# Patient Record
Sex: Male | Born: 1975 | Race: White | Hispanic: No | Marital: Single | Smoking: Current every day smoker
Health system: Southern US, Community
[De-identification: ages and names within clinical notes are randomized; demographics above are authoritative.]

## PROBLEM LIST (undated history)

## (undated) DIAGNOSIS — R7303 Prediabetes: Secondary | ICD-10-CM

## (undated) DIAGNOSIS — G4733 Obstructive sleep apnea (adult) (pediatric): Secondary | ICD-10-CM

## (undated) DIAGNOSIS — E119 Type 2 diabetes mellitus without complications: Secondary | ICD-10-CM

## (undated) DIAGNOSIS — I2510 Atherosclerotic heart disease of native coronary artery without angina pectoris: Secondary | ICD-10-CM

## (undated) DIAGNOSIS — F909 Attention-deficit hyperactivity disorder, unspecified type: Secondary | ICD-10-CM

## (undated) DIAGNOSIS — K900 Celiac disease: Secondary | ICD-10-CM

## (undated) DIAGNOSIS — F329 Major depressive disorder, single episode, unspecified: Secondary | ICD-10-CM

## (undated) DIAGNOSIS — F32A Depression, unspecified: Secondary | ICD-10-CM

## (undated) DIAGNOSIS — I10 Essential (primary) hypertension: Secondary | ICD-10-CM

## (undated) DIAGNOSIS — Z91011 Allergy to milk products: Secondary | ICD-10-CM

## (undated) DIAGNOSIS — R7989 Other specified abnormal findings of blood chemistry: Secondary | ICD-10-CM

## (undated) DIAGNOSIS — F419 Anxiety disorder, unspecified: Secondary | ICD-10-CM

## (undated) DIAGNOSIS — I219 Acute myocardial infarction, unspecified: Secondary | ICD-10-CM

## (undated) DIAGNOSIS — E039 Hypothyroidism, unspecified: Secondary | ICD-10-CM

## (undated) DIAGNOSIS — F259 Schizoaffective disorder, unspecified: Secondary | ICD-10-CM

## (undated) DIAGNOSIS — R748 Abnormal levels of other serum enzymes: Secondary | ICD-10-CM

## (undated) DIAGNOSIS — F22 Delusional disorders: Secondary | ICD-10-CM

## (undated) DIAGNOSIS — E7800 Pure hypercholesterolemia, unspecified: Secondary | ICD-10-CM

## (undated) DIAGNOSIS — M1990 Unspecified osteoarthritis, unspecified site: Secondary | ICD-10-CM

## (undated) HISTORY — DX: Obstructive sleep apnea (adult) (pediatric): G47.33

## (undated) HISTORY — PX: WRIST FRACTURE SURGERY: SHX121

## (undated) HISTORY — PX: NASAL SEPTUM SURGERY: SHX37

## (undated) HISTORY — DX: Allergy to milk products: Z91.011

## (undated) HISTORY — DX: Essential (primary) hypertension: I10

## (undated) HISTORY — DX: Celiac disease: K90.0

## (undated) HISTORY — DX: Pure hypercholesterolemia, unspecified: E78.00

## (undated) HISTORY — PX: APPENDECTOMY: SHX54

## (undated) HISTORY — PX: CARDIAC CATHETERIZATION: SHX172

## (undated) HISTORY — PX: BRAIN SURGERY: SHX531

## (undated) HISTORY — PX: UPPER GI ENDOSCOPY: SHX6162

## (undated) HISTORY — DX: Prediabetes: R73.03

## (undated) HISTORY — PX: COLONOSCOPY: SHX174

---

## 1999-10-28 ENCOUNTER — Ambulatory Visit (HOSPITAL_BASED_OUTPATIENT_CLINIC_OR_DEPARTMENT_OTHER): Admission: RE | Admit: 1999-10-28 | Discharge: 1999-10-28 | Payer: Self-pay | Admitting: Emergency Medicine

## 2000-05-19 ENCOUNTER — Ambulatory Visit: Admission: RE | Admit: 2000-05-19 | Discharge: 2000-05-19 | Payer: Self-pay | Admitting: Otolaryngology

## 2001-01-21 ENCOUNTER — Emergency Department (HOSPITAL_COMMUNITY): Admission: EM | Admit: 2001-01-21 | Discharge: 2001-01-21 | Payer: Self-pay | Admitting: Emergency Medicine

## 2002-05-05 ENCOUNTER — Ambulatory Visit (HOSPITAL_COMMUNITY): Admission: RE | Admit: 2002-05-05 | Discharge: 2002-05-05 | Payer: Self-pay | Admitting: *Deleted

## 2002-05-05 ENCOUNTER — Encounter: Payer: Self-pay | Admitting: *Deleted

## 2003-03-18 ENCOUNTER — Ambulatory Visit (HOSPITAL_COMMUNITY): Admission: RE | Admit: 2003-03-18 | Discharge: 2003-03-18 | Payer: Self-pay | Admitting: Family Medicine

## 2003-07-02 ENCOUNTER — Ambulatory Visit (HOSPITAL_COMMUNITY): Admission: RE | Admit: 2003-07-02 | Discharge: 2003-07-02 | Payer: Self-pay | Admitting: Orthopedic Surgery

## 2003-07-02 IMAGING — CT CT EXTREM UP W/O CM*R*
3 of 4 series · 16 of 33 positions shown, 19 images · non-contrast
Comparison: none

CLINICAL DATA: Right wrist pain and immobility since a fall in [DATE].  The current examination is to evaluate the scaphoid.
 RIGHT WRIST CT WITHOUT CONTRAST ? [DATE]
 Helical transaxial images of the right wrist were obtained without intravenous contrast.  Multiplanar sagittal and coronal reconstruction images were also obtained as well as 3-D surface reconstruction images.  These are correlated with the right wrist radiographs obtained at [REDACTED] of [HOSPITAL] on [DATE].
 A partially healed scaphoid waist fracture is demonstrated with persistent fracture lines dorsally and ventrally.  No displacement.  Mild perifracture bone resorption.  There are also two persistent fracture lines extending into the distal scaphoid which are also partially healed.  
 IMPRESSION
 Partially healed comminuted fracture involving the scaphoid waist and distal scaphoid.  No displacement and no CT findings suspicious for avascular necrosis.
 MULTIPLANAR CT RECONSTRUCTION ? [DATE]
 Multiplanar sagittal, coronal, and 3-D surface reconstruction images demonstrate the findings described above.  
 Please see the above report.

[Series 4: rt.wrist 1.25 b80s · axial · 0.17mm/px · z∈[+494,+543]mm · 8 of 128 slices shown, 10 images]
[im 15/128  soft-tissue]
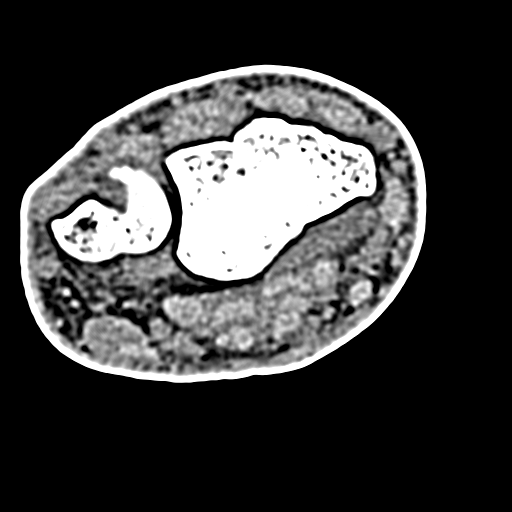
[im 15/128  bone]
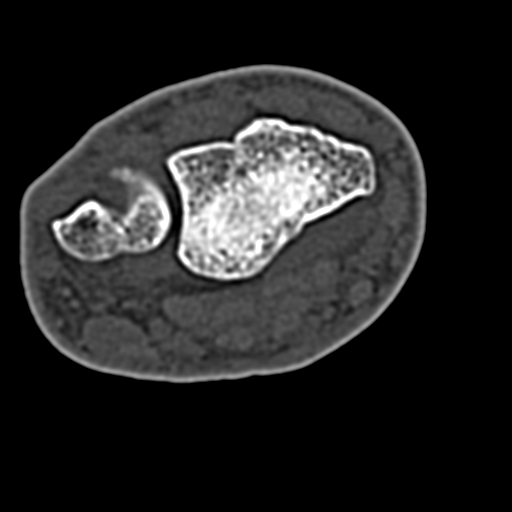
[im 29/128  bone]
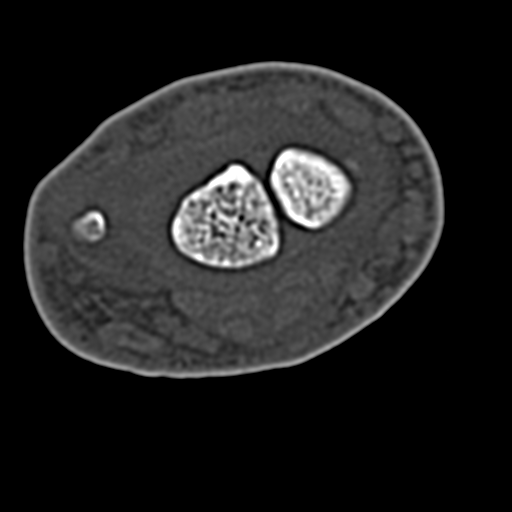
[im 43/128  bone]
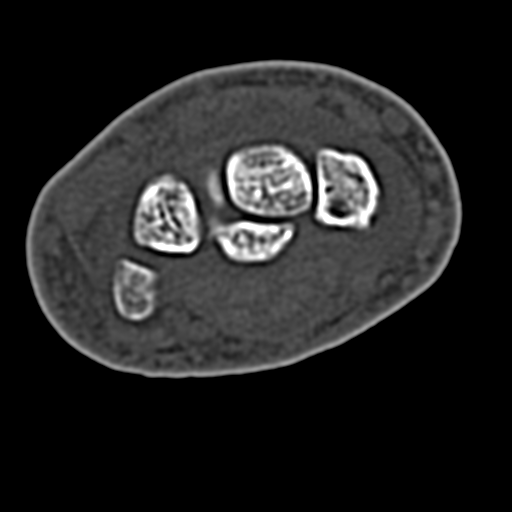
[im 57/128  bone]
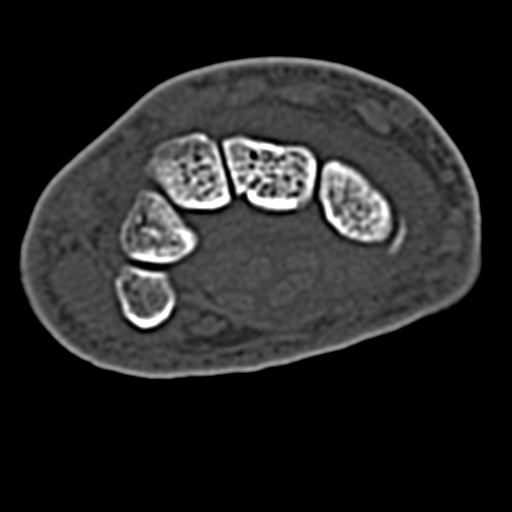
[im 71/128  soft-tissue]
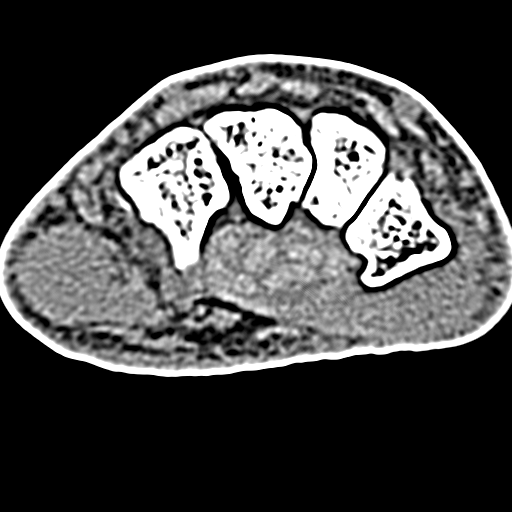
[im 71/128  bone]
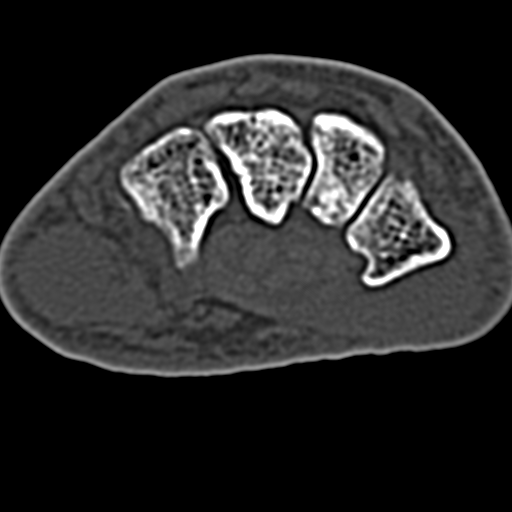
[im 85/128  bone]
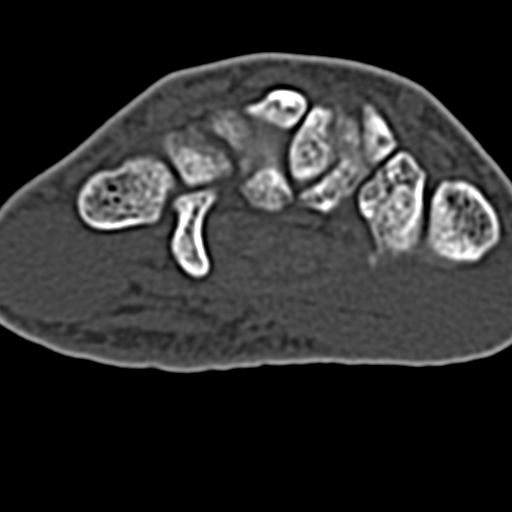
[im 99/128  bone]
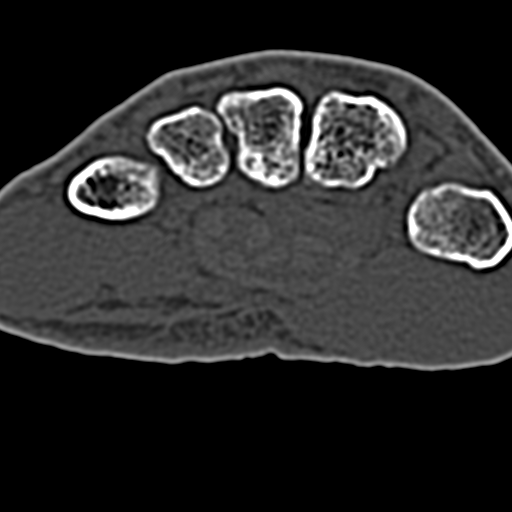
[im 113/128  bone]
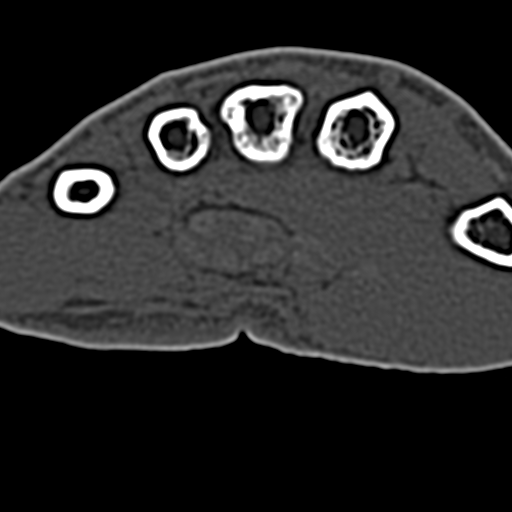

[Series 602: s mpr<mpr range> · sagittal · 0.17mm/px · 5 of 40 slices shown, 6 images]
[im 14/40  bone]
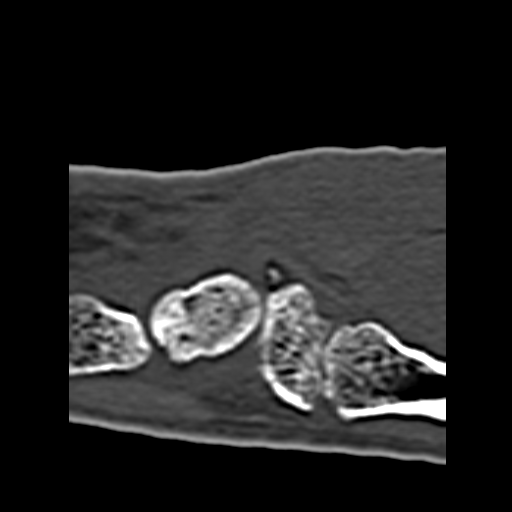
[im 17/40  bone]
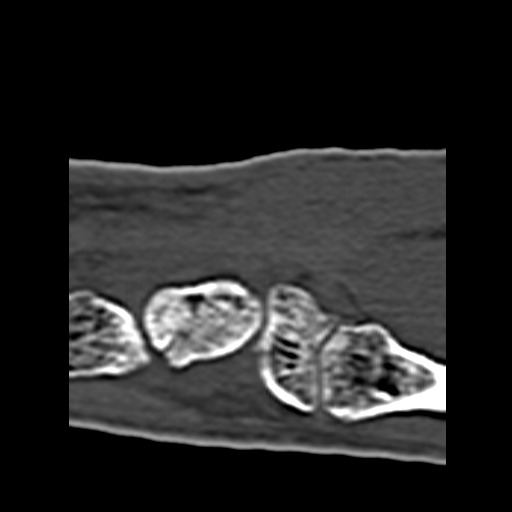
[im 20/40  soft-tissue]
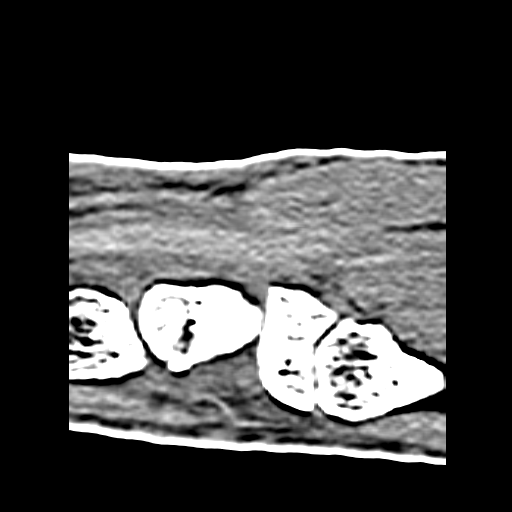
[im 20/40  bone]
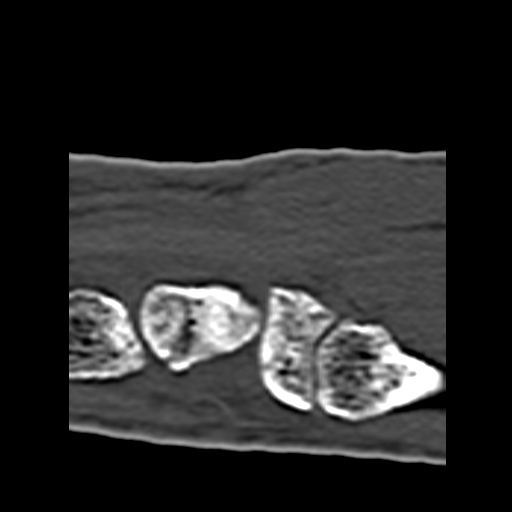
[im 23/40  bone]
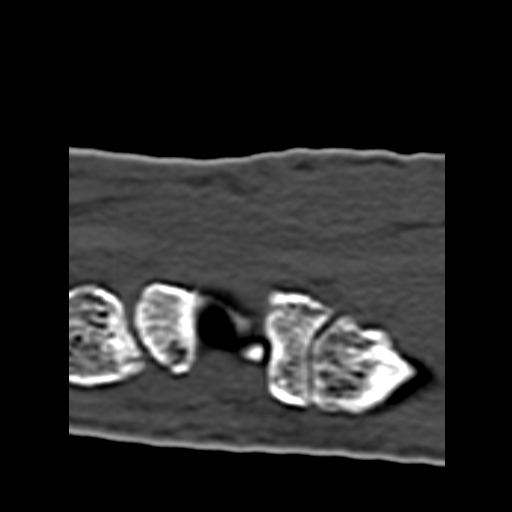
[im 27/40  bone]
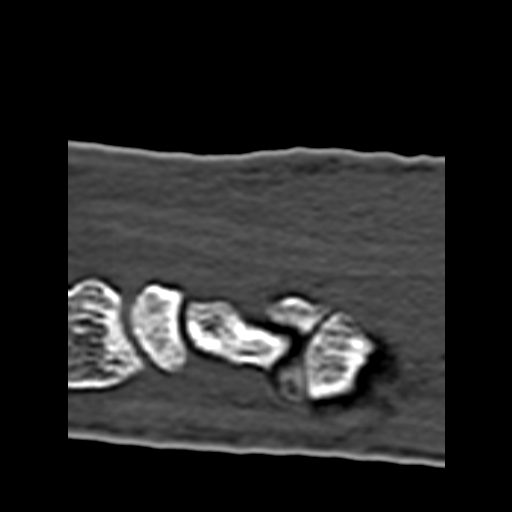

[Series 603: 2nd mpr<mpr range> · coronal · 0.17mm/px · 3 of 40 slices shown]
[im 8/40  bone]
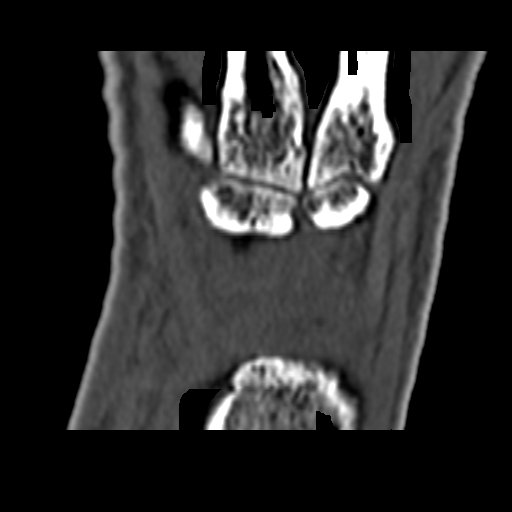
[im 16/40  bone]
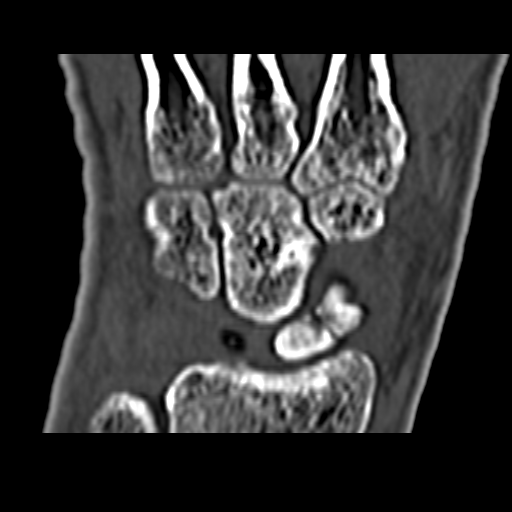
[im 24/40  bone]
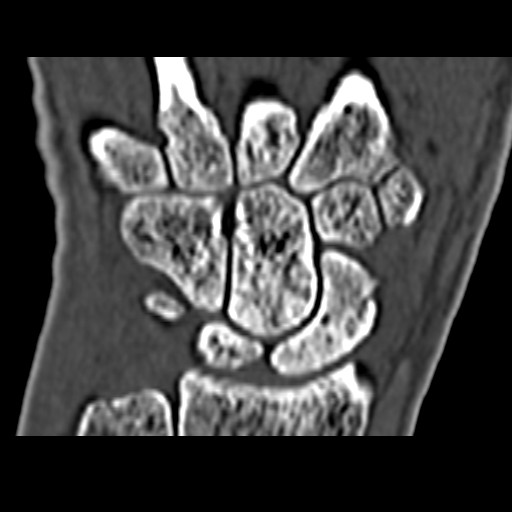

[16 of 33 positions shown; findings below may reference images not displayed]

## 2003-10-14 ENCOUNTER — Ambulatory Visit (HOSPITAL_BASED_OUTPATIENT_CLINIC_OR_DEPARTMENT_OTHER): Admission: RE | Admit: 2003-10-14 | Discharge: 2003-10-14 | Payer: Self-pay | Admitting: Orthopedic Surgery

## 2014-06-04 ENCOUNTER — Institutional Professional Consult (permissible substitution): Payer: Self-pay | Admitting: Pulmonary Disease

## 2014-06-09 ENCOUNTER — Institutional Professional Consult (permissible substitution): Payer: Self-pay | Admitting: Pulmonary Disease

## 2014-06-11 ENCOUNTER — Institutional Professional Consult (permissible substitution): Payer: Self-pay | Admitting: Internal Medicine

## 2014-07-14 ENCOUNTER — Ambulatory Visit (INDEPENDENT_AMBULATORY_CARE_PROVIDER_SITE_OTHER): Payer: 59 | Admitting: Pulmonary Disease

## 2014-07-14 ENCOUNTER — Encounter: Payer: Self-pay | Admitting: Pulmonary Disease

## 2014-07-14 VITALS — BP 132/98 | HR 79 | Ht 74.0 in | Wt 256.6 lb

## 2014-07-14 DIAGNOSIS — R911 Solitary pulmonary nodule: Secondary | ICD-10-CM

## 2014-07-14 DIAGNOSIS — G4733 Obstructive sleep apnea (adult) (pediatric): Secondary | ICD-10-CM

## 2014-07-14 NOTE — Progress Notes (Signed)
Subjective:    Patient ID: Matthew [name], male    DOB: [birth year], 39 y.o.   MRN: [id number]  HPI  Chief Complaint  Patient presents with  . Sleep Consult    Referred by Dr. 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 He reports loud snoring and excessive daytime somnolence since a teenager. He underwent a sleep study, and was advised surgery for deviated septum. Tonsillectomy was deferred. This did not resolve his symptoms. Polysomnogram in 10/2010 in [place] showed severe OSA with AHI of 51 per hour and lowest desaturation 79%-weight 225 pounds. This was corrected by CPAP of 10 cm. However nasal mask caused impressions on his face, and he stopped using this after a few months. He now reports increasing symptoms and would like to get reevaluated. Epworth sleepiness score is 12. He reports frequent nocturnal awakenings and non-refreshing sleep. Bedtime is around 12:30 PM-he works the second shift, sleep latency is 30 minutes, sleeps on his stomach with 3 pillows, reports 2-3 nocturnal awakenings without any post void sleep latency, is out of bed by 10 AM feeling tired with occasional dryness of mouth, denies headaches. He is gained 25 pounds in the last few years to his current weight of 256 pounds. He also has celiac disease that is diet-controlled, chronic anxiety on clonazepam and ADHD on Adderall. He reports finding of a nodule in the right upper lobe-the CT scanner was not formally reported, and is not available. He smokes about 3-5 cigarettes daily  Past Medical History  Diagnosis Date  . Celiac disease   . Hypertension   . Pre-diabetes   . Dairy allergy   . High cholesterol     Past Surgical History  Procedure Laterality Date  . Appendectomy    .  Nasal septum surgery    . Wrist fracture surgery      right wrist    No Known Allergies  History   Social History  . Marital Status: Single    Spouse Name: N/A  . Number of Children: 0  . Years of Education: N/A   Occupational History  . CT Tech    Social History Main Topics  . Smoking status: Current Every Day Smoker  . Smokeless tobacco: Not on file     Comment: 2 cigarettes daily  . Alcohol Use: 0.0 oz/week    0 Standard drinks or equivalent per week     Comment: rarely  . Drug Use: No  . Sexual Activity: Not on file   Other Topics Concern  . Not on file   Social History Narrative  . No narrative on file    Family History  Problem Relation Age of Onset  . Asthma Mother   . Hypertension Mother   . Hypertension Father      Review of Systems neg for any significant sore throat, dysphagia, itching, sneezing, nasal congestion or excess/ purulent secretions, fever, chills, sweats, unintended wt loss, pleuritic or exertional cp, hempoptysis, orthopnea pnd or change in chronic leg swelling. Also denies presyncope, palpitations, heartburn, abdominal pain, nausea, vomiting, diarrhea or change in bowel or urinary habits, dysuria,hematuria, rash, arthralgias, visual complaints, headache, numbness weakness or ataxia.     Objective:   Physical Exam  Gen. Pleasant, obese, in no distress, normal affect ENT -  no lesions, no post nasal drip, class 2-3 airway Neck: No JVD, no thyromegaly, no carotid bruits Lungs: no use of accessory muscles, no dullness to percussion, decreased without rales or rhonchi  Cardiovascular: Rhythm regular, heart sounds  normal, no murmurs or gallops, no peripheral edema Abdomen: soft and non-tender, no hepatosplenomegaly, BS normal. Musculoskeletal: No deformities, no cyanosis or clubbing Neuro:  alert, non focal, no tremors       Assessment & Plan:

## 2014-07-14 NOTE — Assessment & Plan Note (Signed)
Would like to review CT films if he is able to provide. Otherwise 1 year follow-up in October 2016. Smoking cessation was advised

## 2014-07-14 NOTE — Assessment & Plan Note (Signed)
You have severe obstructive sleep apnea  Home sleep study & CPAP titration - based on these, we will send order to DME to provide you with a new CPAP & mask   Given excessive daytime somnolence, narrow pharyngeal exam, witnessed apneas & loud snoring, obstructive sleep apnea is very likely & an overnight polysomnogram will be scheduled as a split study.

## 2014-07-14 NOTE — Patient Instructions (Signed)
You have severe obstructive sleep apnea  Home sleep study & CPAP titration - based on these, we will send order to DME to provide you with a new CPAP & mask Follow up CT chest on october 2016

## 2014-07-28 DIAGNOSIS — G4730 Sleep apnea, unspecified: Secondary | ICD-10-CM

## 2014-07-29 ENCOUNTER — Telehealth: Payer: Self-pay | Admitting: Pulmonary Disease

## 2014-07-29 DIAGNOSIS — G4730 Sleep apnea, unspecified: Secondary | ICD-10-CM

## 2014-07-29 DIAGNOSIS — G4733 Obstructive sleep apnea (adult) (pediatric): Secondary | ICD-10-CM

## 2014-07-29 NOTE — Telephone Encounter (Signed)
Patient stopped breathing 30/hour -- needs CPAP titration study.

## 2014-07-29 NOTE — Telephone Encounter (Signed)
lmtcb

## 2014-07-30 ENCOUNTER — Other Ambulatory Visit: Payer: Self-pay | Admitting: *Deleted

## 2014-07-30 DIAGNOSIS — G4733 Obstructive sleep apnea (adult) (pediatric): Secondary | ICD-10-CM

## 2014-08-02 NOTE — Telephone Encounter (Signed)
Pt is aware that he will need CPAP titration. Order has been placed.

## 2014-08-02 NOTE — Telephone Encounter (Signed)
Pt returned call [phone]

## 2014-09-15 ENCOUNTER — Encounter (HOSPITAL_BASED_OUTPATIENT_CLINIC_OR_DEPARTMENT_OTHER): Payer: 59

## 2014-09-16 ENCOUNTER — Other Ambulatory Visit: Payer: Self-pay | Admitting: Otolaryngology

## 2014-09-16 DIAGNOSIS — E049 Nontoxic goiter, unspecified: Secondary | ICD-10-CM

## 2014-09-17 ENCOUNTER — Ambulatory Visit (HOSPITAL_BASED_OUTPATIENT_CLINIC_OR_DEPARTMENT_OTHER): Payer: 59 | Attending: Pulmonary Disease | Admitting: *Deleted

## 2014-09-17 VITALS — Ht 74.0 in | Wt 230.0 lb

## 2014-09-17 DIAGNOSIS — R0683 Snoring: Secondary | ICD-10-CM | POA: Insufficient documentation

## 2014-09-17 DIAGNOSIS — G4733 Obstructive sleep apnea (adult) (pediatric): Secondary | ICD-10-CM | POA: Diagnosis not present

## 2014-09-23 DIAGNOSIS — G4730 Sleep apnea, unspecified: Secondary | ICD-10-CM | POA: Diagnosis not present

## 2014-09-24 ENCOUNTER — Telehealth: Payer: Self-pay | Admitting: Pulmonary Disease

## 2014-09-24 DIAGNOSIS — G4733 Obstructive sleep apnea (adult) (pediatric): Secondary | ICD-10-CM

## 2014-09-24 NOTE — Telephone Encounter (Signed)
CPAP 11 cm, large FF mask, order sent to DME Pl arrange OV in 6 wks

## 2014-09-24 NOTE — Telephone Encounter (Signed)
Patient notified. Patient scheduled for 6 week follow up.  Nothing further needed.

## 2014-09-25 ENCOUNTER — Emergency Department (HOSPITAL_COMMUNITY): Payer: 59

## 2014-09-25 ENCOUNTER — Emergency Department (HOSPITAL_COMMUNITY)
Admission: EM | Admit: 2014-09-25 | Discharge: 2014-09-26 | Disposition: A | Discharge status: Home / Self Care | Payer: 59 | Attending: Emergency Medicine | Admitting: Emergency Medicine

## 2014-09-25 ENCOUNTER — Encounter (HOSPITAL_COMMUNITY): Payer: Self-pay | Admitting: Emergency Medicine

## 2014-09-25 DIAGNOSIS — Z8719 Personal history of other diseases of the digestive system: Secondary | ICD-10-CM | POA: Insufficient documentation

## 2014-09-25 DIAGNOSIS — Z720 Tobacco use: Secondary | ICD-10-CM | POA: Insufficient documentation

## 2014-09-25 DIAGNOSIS — R110 Nausea: Secondary | ICD-10-CM | POA: Insufficient documentation

## 2014-09-25 DIAGNOSIS — I10 Essential (primary) hypertension: Secondary | ICD-10-CM | POA: Insufficient documentation

## 2014-09-25 DIAGNOSIS — R519 Headache, unspecified: Secondary | ICD-10-CM

## 2014-09-25 DIAGNOSIS — H53149 Visual discomfort, unspecified: Secondary | ICD-10-CM | POA: Diagnosis not present

## 2014-09-25 DIAGNOSIS — R51 Headache: Secondary | ICD-10-CM | POA: Insufficient documentation

## 2014-09-25 DIAGNOSIS — Z8639 Personal history of other endocrine, nutritional and metabolic disease: Secondary | ICD-10-CM | POA: Diagnosis not present

## 2014-09-25 DIAGNOSIS — Z79899 Other long term (current) drug therapy: Secondary | ICD-10-CM | POA: Insufficient documentation

## 2014-09-25 LAB — COMPREHENSIVE METABOLIC PANEL
ALBUMIN: 4.4 g/dL (ref 3.5–5.0)
ALT: 38 U/L (ref 17–63)
ANION GAP: 7 (ref 5–15)
AST: 26 U/L (ref 15–41)
Alkaline Phosphatase: 54 U/L (ref 38–126)
BUN: 14 mg/dL (ref 6–20)
CO2: 25 mmol/L (ref 22–32)
CREATININE: 1.33 mg/dL — AB (ref 0.61–1.24)
Calcium: 9.3 mg/dL (ref 8.9–10.3)
Chloride: 101 mmol/L (ref 101–111)
GFR calc Af Amer: >60 mL/min (ref >60)
GFR calc non Af Amer: >60 mL/min (ref >60)
Glucose, Bld: 118 mg/dL — ABNORMAL HIGH (ref 70–99)
Potassium: 3.5 mmol/L (ref 3.5–5.1)
Sodium: 133 mmol/L — ABNORMAL LOW (ref 135–145)
Total Bilirubin: 0.6 mg/dL (ref 0.3–1.2)
Total Protein: 7.1 g/dL (ref 6.5–8.1)

## 2014-09-25 LAB — CBC WITH DIFFERENTIAL/PLATELET
BASOS ABS: 0 10*3/uL (ref 0.0–0.1)
BASOS PCT: 0 % (ref 0–1)
EOS ABS: 0.1 10*3/uL (ref 0.0–0.7)
Eosinophils Relative: 1 % (ref 0–5)
HEMATOCRIT: 45.2 % (ref 39.0–52.0)
Hemoglobin: 16.1 g/dL (ref 13.0–17.0)
LYMPHS PCT: 25 % (ref 12–46)
Lymphs Abs: 3.9 10*3/uL (ref 0.7–4.0)
MCH: 30.9 pg (ref 26.0–34.0)
MCHC: 35.6 g/dL (ref 30.0–36.0)
MCV: 86.8 fL (ref 78.0–100.0)
MONO ABS: 0.8 10*3/uL (ref 0.1–1.0)
Monocytes Relative: 5 % (ref 3–12)
Neutro Abs: 11 10*3/uL — ABNORMAL HIGH (ref 1.7–7.7)
Neutrophils Relative %: 69 % (ref 43–77)
PLATELETS: 282 10*3/uL (ref 150–400)
RBC: 5.21 MIL/uL (ref 4.22–5.81)
RDW: 13.2 % (ref 11.5–15.5)
WBC: 15.8 10*3/uL — AB (ref 4.0–10.5)

## 2014-09-25 IMAGING — CT CT HEAD W/O CM
1 of 2 series · 16 of 30 positions shown, 20 images · non-contrast
Comparison: None.

CLINICAL DATA: Headache and hypertension.

EXAM:
CT HEAD WITHOUT CONTRAST
TECHNIQUE: Contiguous axial images were obtained from the base of the skull
through the vertex without intravenous contrast.

[Series 3: head 2.0 h70h · axial · 0.48mm/px · z∈[-40,+106]mm · 16 of 83 slices shown, 20 images]
[im 5/83  brain]
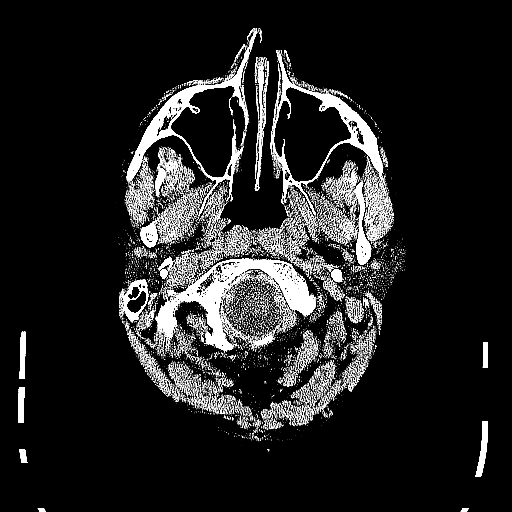
[im 5/83  bone]
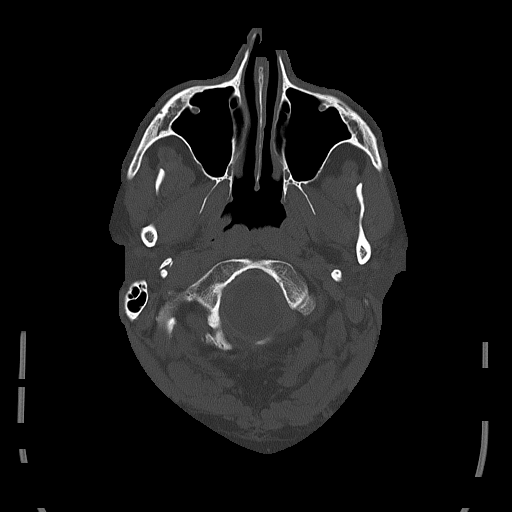
[im 9/83  brain]
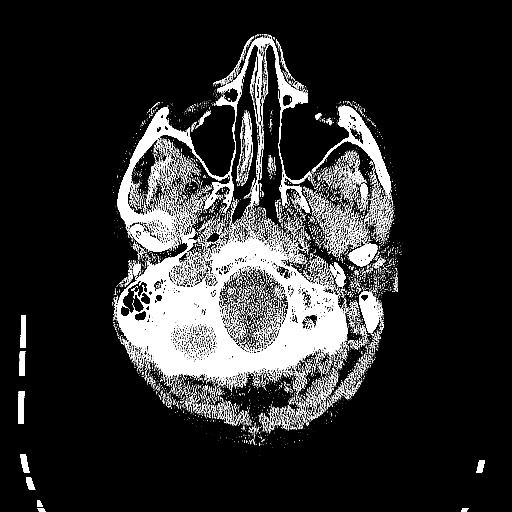
[im 13/83  brain]
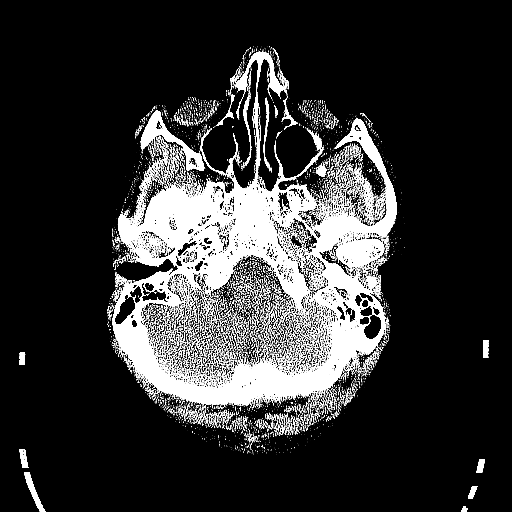
[im 21/83  brain]
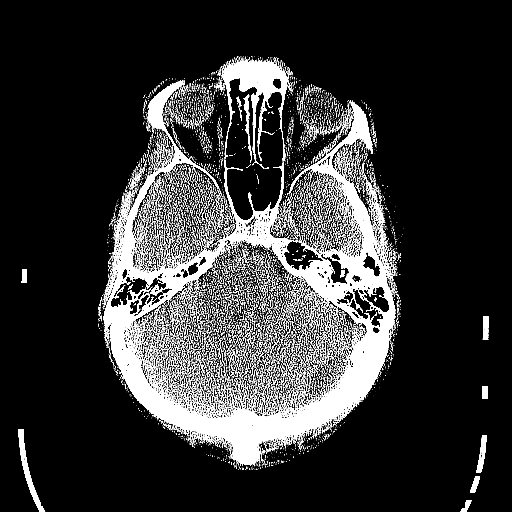
[im 25/83  brain]
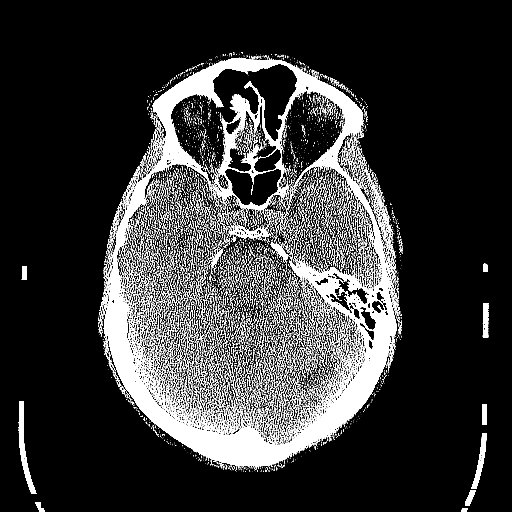
[im 25/83  bone]
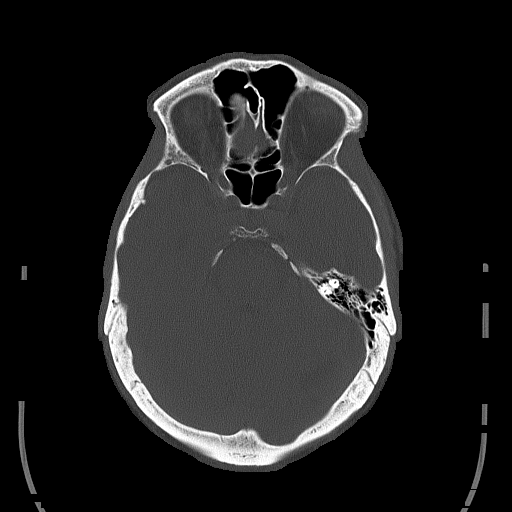
[im 29/83  brain]
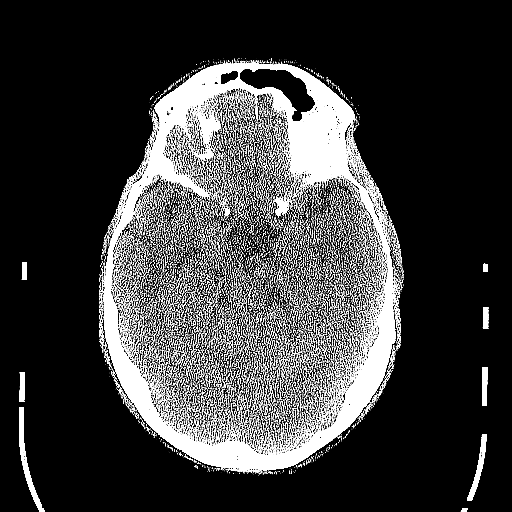
[im 33/83  brain]
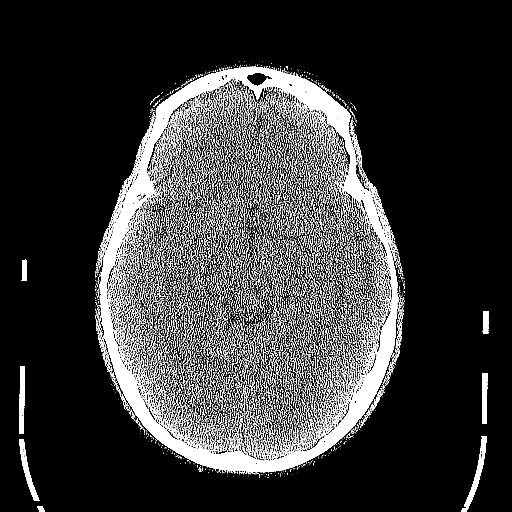
[im 37/83  brain]
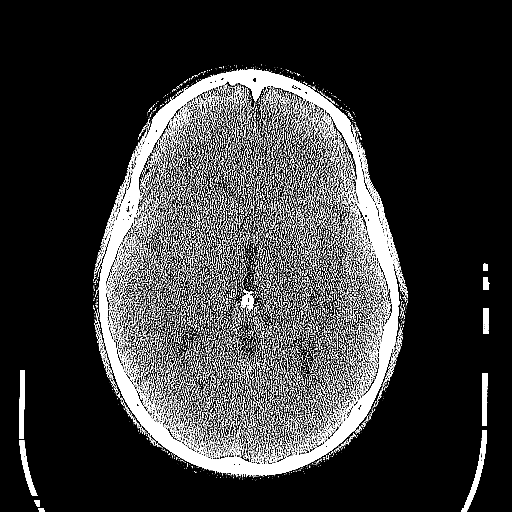
[im 46/83  brain]
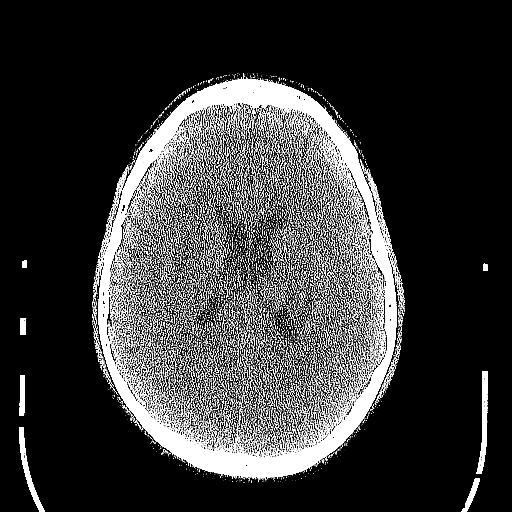
[im 46/83  bone]
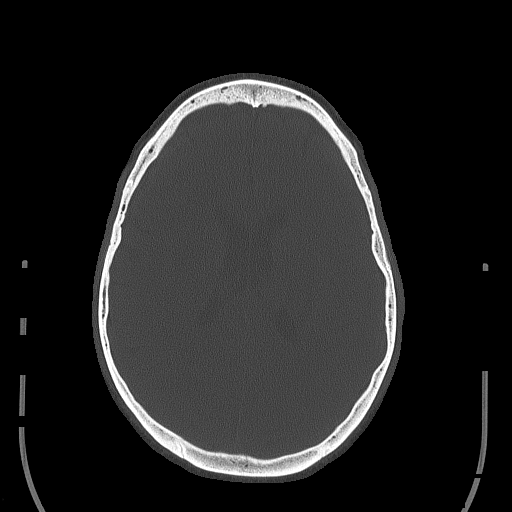
[im 50/83  brain]
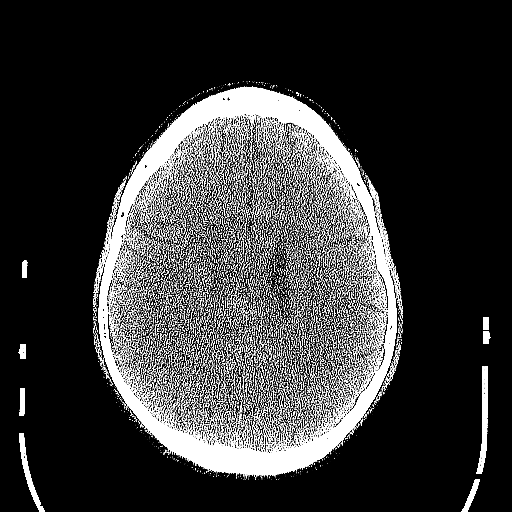
[im 54/83  brain]
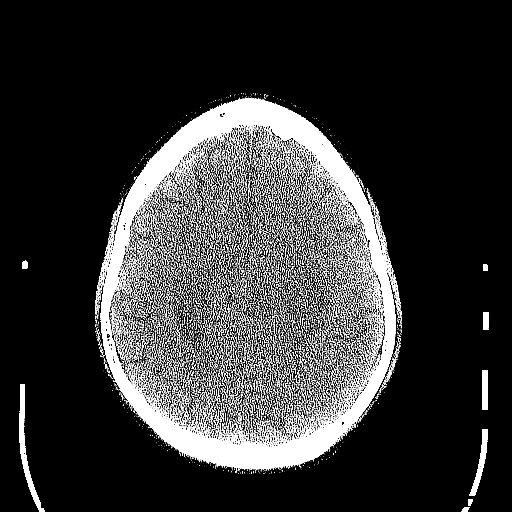
[im 58/83  brain]
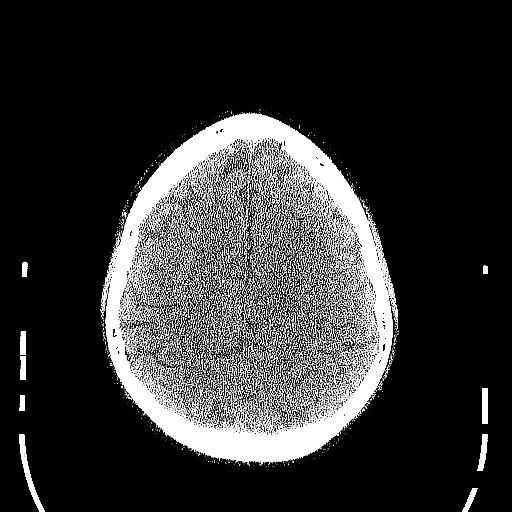
[im 62/83  brain]
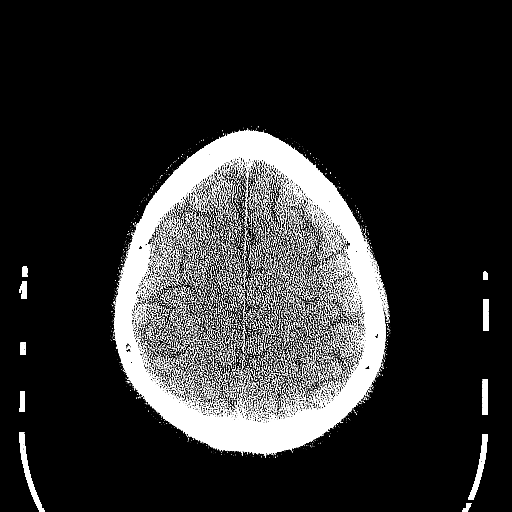
[im 62/83  bone]
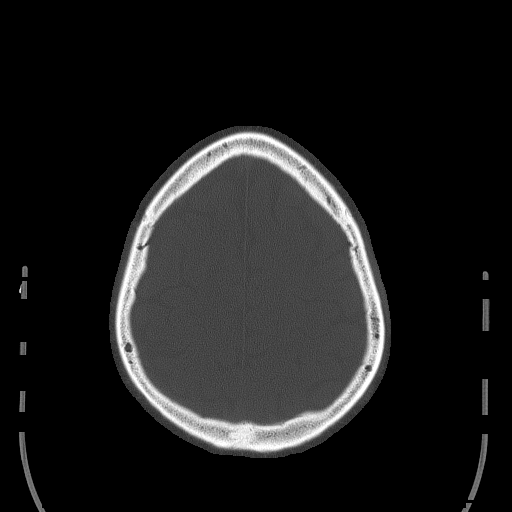
[im 70/83  brain]
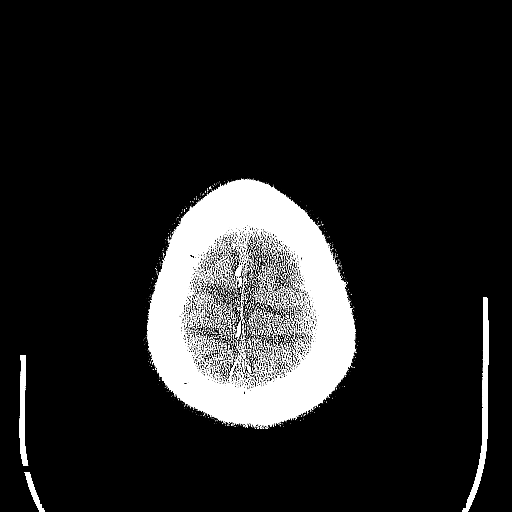
[im 74/83  brain]
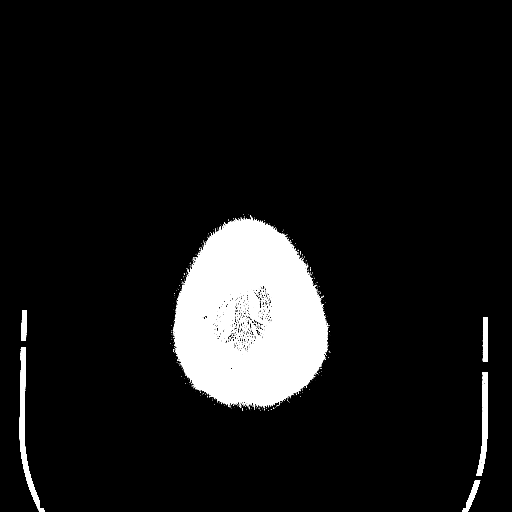
[im 78/83  brain]
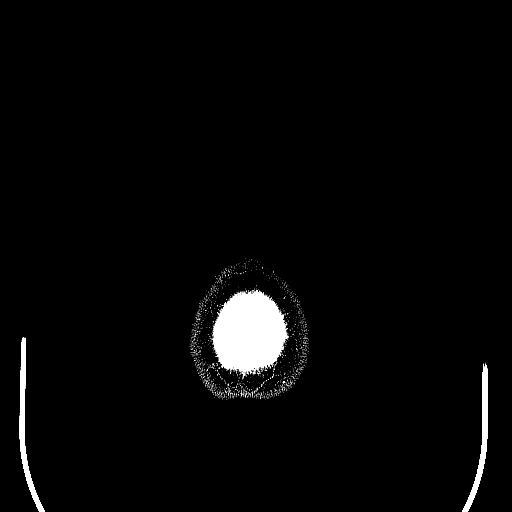

[16 of 30 positions shown; findings below may reference images not displayed]

FINDINGS: There is a 2.9 x 1.0 cm CSF density focus in the left posterior
superior aspect of the posterior fossa along the tentorium
suggestive of a small arachnoid cyst. The brain is otherwise
unremarkable without evidence of acute infarct, intracranial
hemorrhage, mass, midline shift, or extra-axial fluid collection.
Ventricles and sulci are normal for age.

Orbits are unremarkable. Mastoid air cells and visualized paranasal
sinuses are clear.
IMPRESSION: 1. No evidence of acute intracranial abnormality.
2. Likely incidental, small arachnoid cyst in the left posterior
fossa.

## 2014-09-25 MED ORDER — PROCHLORPERAZINE EDISYLATE 5 MG/ML IJ SOLN
10.0000 mg | Freq: Once | INTRAMUSCULAR | Status: AC
  Administered 2014-09-25: 10 mg via INTRAVENOUS
  Filled 2014-09-25: qty 2

## 2014-09-25 MED ORDER — DIPHENHYDRAMINE HCL 50 MG/ML IJ SOLN
25.0000 mg | Freq: Once | INTRAMUSCULAR | Status: AC
  Administered 2014-09-25: 25 mg via INTRAVENOUS
  Filled 2014-09-25: qty 1

## 2014-09-25 MED ORDER — TIZANIDINE HCL 4 MG PO CAPS: 4.0000 mg | ORAL_CAPSULE | Freq: Three times a day (TID) | ORAL | Status: DC | PRN

## 2014-09-25 MED ORDER — DEXAMETHASONE SODIUM PHOSPHATE 10 MG/ML IJ SOLN
10.0000 mg | Freq: Once | INTRAMUSCULAR | Status: AC
  Administered 2014-09-25: 10 mg via INTRAVENOUS
  Filled 2014-09-25: qty 1

## 2014-09-25 MED ORDER — METOCLOPRAMIDE HCL 5 MG/ML IJ SOLN
10.0000 mg | Freq: Once | INTRAMUSCULAR | Status: AC
  Administered 2014-09-25: 10 mg via INTRAVENOUS
  Filled 2014-09-25: qty 2

## 2014-09-25 NOTE — Discharge Instructions (Signed)
Please follow up with your primary care physician in 1-2 days. If you do not have one please call the [facility] number listed above. Please follow up with a neurologist to schedule a follow up appointment. On your CT scan you were found to have a 3 cm x 1 cm arachnoid cyst, likely an incidental finding. The neurologists should be able to follow this for you. Please read all discharge instructions and return precautions.   Migraine Headache A migraine headache is an intense, throbbing pain on one or both sides of your head. A migraine can last for 30 minutes to several hours. CAUSES  The exact cause of a migraine headache is not always known. However, a migraine may be caused when nerves in the brain become irritated and release chemicals that cause inflammation. This causes pain. Certain things may also trigger migraines, such as:  Alcohol.  Smoking.  Stress.  Menstruation.  Aged cheeses.  Foods or drinks that contain nitrates, glutamate, aspartame, or tyramine.  Lack of sleep.  Chocolate.  Caffeine.  Hunger.  Physical exertion.  Fatigue.  Medicines used to treat chest pain (nitroglycerine), birth control pills, estrogen, and some blood pressure medicines. SIGNS AND SYMPTOMS  Pain on one or both sides of your head.  Pulsating or throbbing pain.  Severe pain that prevents daily activities.  Pain that is aggravated by any physical activity.  Nausea, vomiting, or both.  Dizziness.  Pain with exposure to bright lights, loud noises, or activity.  General sensitivity to bright lights, loud noises, or smells. Before you get a migraine, you may get warning signs that a migraine is coming (aura). An aura may include:  Seeing flashing lights.  Seeing bright spots, halos, or zigzag lines.  Having tunnel vision or blurred vision.  Having feelings of numbness or tingling.  Having trouble talking.  Having muscle weakness. DIAGNOSIS  A migraine  headache is often diagnosed based on:  Symptoms.  Physical exam.  A CT scan or MRI of your head. These imaging tests cannot diagnose migraines, but they can help rule out other causes of headaches. TREATMENT Medicines may be given for pain and nausea. Medicines can also be given to help prevent recurrent migraines.  HOME CARE INSTRUCTIONS  Only take over-the-counter or prescription medicines for pain or discomfort as directed by your health care provider. The use of long-term narcotics is not recommended.  Lie down in a dark, quiet room when you have a migraine.  Keep a journal to find out what may trigger your migraine headaches. For example, write down:  What you eat and drink.  How much sleep you get.  Any change to your diet or medicines.  Limit alcohol consumption.  Quit smoking if you smoke.  Get 7-9 hours of sleep, or as recommended by your health care provider.  Limit stress.  Keep lights dim if bright lights bother you and make your migraines worse. SEEK IMMEDIATE MEDICAL CARE IF:   Your migraine becomes severe.  You have a fever.  You have a stiff neck.  You have vision loss.  You have muscular weakness or loss of muscle control.  You start losing your balance or have trouble walking.  You feel faint or pass out.  You have severe symptoms that are different from your first symptoms. MAKE SURE YOU:   Understand these instructions.  Will watch your condition.  Will get help right away if you are not doing well or get worse. Document Released: 05/07/2005  Document Revised: 09/21/2013 Document Reviewed: 01/12/2013 [facility] Patient Information 2015 [place], [place]. This information is not intended to replace advice given to you by your health care provider. Make sure you discuss any questions you have with your health care provider.

## 2014-09-25 NOTE — ED Notes (Addendum)
Pt noted having intermittant bigeminy

## 2014-09-25 NOTE — ED Notes (Signed)
Pt. reports left side headache with photophobia and elevated blood pressure onset this week . , denies nausea or fever .

## 2014-09-25 NOTE — ED Notes (Signed)
Pt has Butrans TD patch on R shoulder foer back pain 715mcg/hr

## 2014-09-25 NOTE — ED Notes (Signed)
Lateral calf in R leg. C/o calf and foot cramps for last month

## 2014-09-25 NOTE — ED Provider Notes (Signed)
CSN: [id number]     Arrival date & time 09/25/14  2028 History   None    Chief Complaint  Patient presents with  . Headache  . Hypertension     (Consider location/radiation/quality/duration/timing/severity/associated sxs/prior Treatment) HPI Comments: Patient is a 39 yo M PMHx significant for HTN, HLD, tobacco abuse presenting to the ED for evaluation of left sided throbbing pounding headache that has been on and off the last few weeks. Patient states his headache re-started at 4:15PM more severe than his previous headaches. He states he has tried Excedrin with little to no improvement. Denies any falls or injuries, fevers, rashes. Endorses associated photophobia and nausea. No modifying factors identified.    Past Medical History  Diagnosis Date  . Celiac disease   . Hypertension   . Pre-diabetes   . Dairy allergy   . High cholesterol    Past Surgical History  Procedure Laterality Date  . Appendectomy    . Nasal septum surgery    . Wrist fracture surgery      right wrist  . Brain surgery     Family History  Problem Relation Age of Onset  . Asthma Mother   . Hypertension Mother   . Hypertension Father    History  Substance Use Topics  . Smoking status: Current Every Day Smoker  . Smokeless tobacco: Not on file     Comment: 2 cigarettes daily  . Alcohol Use: 0.0 oz/week    0 Standard drinks or equivalent per week     Comment: rarely    Review of Systems  Eyes: Positive for photophobia.  Gastrointestinal: Positive for nausea.  Neurological: Positive for headaches. Negative for syncope.  All other systems reviewed and are negative.     Allergies  Review of patient's allergies indicates no known allergies.  Home Medications   Prior to Admission medications   Medication Sig Start Date End Date Taking? Authorizing Provider  amphetamine-dextroamphetamine (ADDERALL) 10 MG tablet Take 30 mg by mouth 2 (two) times daily as needed.    Yes Historical Provider, MD   aspirin-acetaminophen-caffeine (EXCEDRIN MIGRAINE) [phone] MG per tablet Take 2 tablets by mouth 2 (two) times daily as needed for migraine.   Yes Historical Provider, MD  atenolol (TENORMIN) 100 MG tablet Take 100 mg by mouth at bedtime.    Yes Historical Provider, MD  Buprenorphine 15 MCG/HR PTWK Place 1 patch onto the skin once a week.   Yes Historical Provider, MD  DULoxetine HCl (CYMBALTA PO) Take 120 mg by mouth daily.   Yes Historical Provider, MD  methocarbamol (ROBAXIN) 750 MG tablet Take 750 mg by mouth 2 (two) times daily as needed for muscle spasms.   Yes Historical Provider, MD  mirtazapine (REMERON) 45 MG tablet Take 45 mg by mouth at bedtime.   Yes Historical Provider, MD  Misc Natural Products (JOINT SUPPORT COMPLEX PO) Take 1 tablet by mouth daily.   Yes Historical Provider, MD  Multiple Vitamin (MULTIVITAMIN) tablet Take 1 tablet by mouth daily.   Yes Historical Provider, MD  Omega-3 Fatty Acids (FISH OIL PO) Take 1 tablet by mouth daily.   Yes Historical Provider, MD  OXAYDO 5 MG TABA Take 5 mg by mouth 2 (two) times daily as needed (pain).  09/24/14  Yes Historical Provider, MD  oxymetazoline (AFRIN) 0.05 % nasal spray Place 1 spray into both nostrils daily as needed for congestion.   Yes Historical Provider, MD  pseudoephedrine (SUDAFED) 30 MG tablet Take 30 mg by mouth  daily as needed for congestion.   Yes Historical Provider, MD  traZODone (DESYREL) 100 MG tablet Take 100 mg by mouth at bedtime.   Yes Historical Provider, MD  clonazePAM (KLONOPIN) 2 MG tablet Take 2 mg by mouth 2 (two) times daily as needed for anxiety.    Historical Provider, MD  tiZANidine (ZANAFLEX) 4 MG capsule Take 1 capsule (4 mg total) by mouth 3 (three) times daily as needed (headache). 09/25/14   Audriella Blakeley, PA-C   BP 136/68 mmHg  Pulse 68  Temp(Src) 98 F (36.7 C) (Oral)  Resp 16  Ht 6\' 2"  (1.88 m)  Wt 228 lb (103.42 kg)  BMI 29.26 kg/m2  SpO2 100% Physical Exam  Constitutional:  He is oriented to person, place, and time. He appears well-developed and well-nourished. No distress.  HENT:  Head: Normocephalic and atraumatic.  Right Ear: External ear normal.  Left Ear: External ear normal.  Nose: Nose normal.  Mouth/Throat: Oropharynx is clear and moist. No oropharyngeal exudate.  Eyes: Conjunctivae and EOM are normal. Pupils are equal, round, and reactive to light.  Neck: Normal range of motion. Neck supple.  Cardiovascular: Normal rate, regular rhythm, normal heart sounds and intact distal pulses.   Pulmonary/Chest: Effort normal and breath sounds normal. No respiratory distress.  Abdominal: Soft. There is no tenderness.  Neurological: He is alert and oriented to person, place, and time. He has normal strength. No cranial nerve deficit. Gait normal. GCS eye subscore is 4. GCS verbal subscore is 5. GCS motor subscore is 6.  Sensation grossly intact.  No pronator drift.  Bilateral heel-knee-shin intact.  Skin: Skin is warm and dry. He is not diaphoretic.  Nursing note and vitals reviewed.   ED Course  Procedures (including critical care time) Medications  diphenhydrAMINE (BENADRYL) injection 25 mg (25 mg Intravenous Given 09/25/14 2208)  metoCLOPramide (REGLAN) injection 10 mg (10 mg Intravenous Given 09/25/14 2209)  prochlorperazine (COMPAZINE) injection 10 mg (10 mg Intravenous Given 09/25/14 2209)  dexamethasone (DECADRON) injection 10 mg (10 mg Intravenous Given 09/25/14 2333)    Labs Review Labs Reviewed  CBC WITH DIFFERENTIAL/PLATELET - Abnormal; Notable for the following:    WBC 15.8 (*)    Neutro Abs 11.0 (*)    All other components within normal limits  COMPREHENSIVE METABOLIC PANEL - Abnormal; Notable for the following:    Sodium 133 (*)    Glucose, Bld 118 (*)    Creatinine, Ser 1.33 (*)    All other components within normal limits    Imaging Review Ct Head Wo Contrast  09/25/2014   CLINICAL DATA:  Headache and hypertension.  EXAM: CT HEAD WITHOUT  CONTRAST  TECHNIQUE: Contiguous axial images were obtained from the base of the skull through the vertex without intravenous contrast.  COMPARISON:  None.  FINDINGS: There is a 2.9 x 1.0 cm CSF density focus in the left posterior superior aspect of the posterior fossa along the tentorium suggestive of a small arachnoid cyst. The brain is otherwise unremarkable without evidence of acute infarct, intracranial hemorrhage, mass, midline shift, or extra-axial fluid collection. Ventricles and sulci are normal for age.  Orbits are unremarkable. Mastoid air cells and visualized paranasal sinuses are clear.  IMPRESSION: 1. No evidence of acute intracranial abnormality. 2. Likely incidental, small arachnoid cyst in the left posterior fossa.   Electronically Signed   By: Sebastian AcheAllen  Grady   On: 09/25/2014 23:24     EKG Interpretation None      MDM   Final  diagnoses:  Nonintractable headache, unspecified chronicity pattern, unspecified headache type    Filed Vitals:   09/26/14 0024  BP: 136/68  Pulse: 68  Temp: 98 F (36.7 C)  Resp: 16   Afebrile, NAD, non-toxic appearing, AAOx4.  I have reviewed nursing notes, vital signs, and all appropriate lab and imaging results if ordered as above. Pt HA treated and improved while in ED.  Presentation is non concerning for [facility], ICH, Meningitis, or temporal arteritis. CT scan reviewed, discussed arachnoid cyst with patient. Pt is afebrile with no focal neuro deficits, nuchal rigidity, or change in vision. Pt is to follow up with PCP and/or neurology to discuss prophylactic medication. Pt verbalizes understanding and is agreeable with plan to dc. Patient is stable at time of discharge. Patient d/w with Dr. Adriana Simasook, agrees with plan.       Francee PiccoloJennifer Tieshia Rettinger, PA-C 09/26/14 [id number]  Donnetta HutchingBrian Cook, MD 09/26/14 [phone]

## 2014-09-26 NOTE — ED Notes (Signed)
Pt verbalized understanding of d/c instructions and has no further questions.  

## 2014-09-29 NOTE — Sleep Study (Signed)
[facility]  NAME: Matthew [name]  DATE OF BIRTH: 11/02/1975  MEDICAL RECORD NUMBER [id number]  LOCATION: [facility]  PHYSICIAN: ALVA,RAKESH V.  DATE OF STUDY: 09/17/14   SLEEP STUDY TYPE: CPAP titration study               REFERRING PHYSICIAN: Oretha MilchAlva, Rakesh V, MD  INDICATION FOR STUDY: 39 year old with loud snoring and unrefreshing sleep. Home study showed AHI of 30 per hour At the time of this study ,they weighed 230 pounds with a height of  6 ft 2 inches and the BMI of 30, neck size of 17 inches. Epworth sleepiness score was 19   This CPAP titration polysomnogram was performed with a sleep technologist in attendance. EEG, EOG,EMG and respiratory parameters recorded. Sleep stages, arousals, limb movements and respiratory data was scored according to criteria laid out by the American Academy of sleep medicine.  SLEEP ARCHITECTURE: Lights out was at 2231 PM and lights on was at 442 AM. Total sleep time was 275 minutes with sleep period time of 348 minutes and sleep efficiency of 74% .Sleep latency was 22 minutes and wake after sleep onset of 72 minutes.  Sleep stages as a percentage of total sleep time was N1 11% N2- 89 % and REM sleep 0 % ( 0 minutes) .  AROUSAL DATA : There were 21 arousals with an arousal index of 4.6 events per hour. Of these 15 were spontaneous, and 6 were associated with respiratory events and 0 were associated periodic limb movements  RESPIRATORY DATA: CPAP was initiated at 5 centimeters and titrated to a final level of 11 centimeters due to respiratory events and snoring. At the final level of 11 centimeters for 32 minutes, there were 0 obstructive apneas, 0 central apneas, 0 mixed apneas and 0 hypopneas with apnea -hypopnea index of 0 events per hour.  There was no relation to sleep stage or body position. Titration was optimal.  MOVEMENT/PARASOMNIA: There were 0 PLMS with a PLM index of 0 events per hour. The PLM arousal  index was 0 events per hour.  OXYGEN DATA: The lowest desaturation was 84 % during non-REM sleep and the desaturation index was 10 per hour. The saturations stayed below 88% for 4 minutes.  CARDIAC DATA: The low heart rate was 30 beats per minute. The high heart rate recorded was an artifact. No arrhythmias were noted   IMPRESSION :  1. Severe obstructive sleep apnea with hypopneas causing sleep fragmentation and mild oxygen desaturation. 2. This was corrected by CPAP of 11 centimeters with a large Fisher-Paykel fullface mask. Titration was optimal. 3. No evidence of cardiac arrhythmias or behavioral disturbance during sleep. 4. Periodic limb movements were not noted.  RECOMMENDATION:    1. The treatment options for this degree of sleep disordered breathing includes weight loss and/or CPAP therapy. CPAP can be initiated at 11 centimeters with a large full face mask and compliance monitored at this level. 2. Patient should be cautioned against driving when sleepy 3. They should be asked to avoid medications with sedative side effects  Oretha MilchALVA,RAKESH V.  MD Diplomate, American Board of Sleep Medicine  ELECTRONICALLY SIGNED ON:  09/29/2014  [facility] PH: [phone]   FX: [phone] ACCREDITED BY THE AMERICAN ACADEMY OF SLEEP MEDICINE

## 2014-11-18 ENCOUNTER — Encounter: Payer: Self-pay | Admitting: Pulmonary Disease

## 2014-11-18 ENCOUNTER — Ambulatory Visit (INDEPENDENT_AMBULATORY_CARE_PROVIDER_SITE_OTHER): Payer: 59 | Admitting: Pulmonary Disease

## 2014-11-18 VITALS — BP 120/86 | HR 86 | Ht 74.0 in | Wt 226.0 lb

## 2014-11-18 DIAGNOSIS — G4733 Obstructive sleep apnea (adult) (pediatric): Secondary | ICD-10-CM | POA: Diagnosis not present

## 2014-11-18 DIAGNOSIS — R911 Solitary pulmonary nodule: Secondary | ICD-10-CM | POA: Diagnosis not present

## 2014-11-18 NOTE — Assessment & Plan Note (Signed)
Persist with nasal pillows + chin strap Call if does not work - & we can send you to sleep lab for mask fit - oro-nasal mask Last option - cloth mask  Weight loss encouraged, compliance with goal of at least 4-6 hrs every night is the expectation. Advised against medications with sedative side effects Cautioned against driving when sleepy - understanding that sleepiness will vary on a day to day basis

## 2014-11-18 NOTE — Assessment & Plan Note (Signed)
CT chest will be scheduled

## 2014-11-18 NOTE — Progress Notes (Signed)
   Subjective:    Patient ID: Matthew [name], male    DOB: 03/30/1976, 39 y.o.   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  He also has celiac disease that is diet-controlled, chronic anxiety on clonazepam and ADHD on Adderall. He reports finding of a nodule in the right upper lobe-the CT scanner was not formally reported, and is not available. He smokes about 3-5 cigarettes daily  11/18/2014  Chief Complaint  Patient presents with  . Sleep Apnea    Patient says that mask is leaving marks on his face.  Rash all over body, skin very sensitive, red and irritated, feels very fatigued.    10/2014 5868m download on 11 cm >> no residuals, no leak, usage sporadic - good days upto 8h -avg 4h CPAP mask caused marks on his face, tried nasal pillows - caused mouth leakage - added chin strap Pr ok, feels rested when he is able to leave machine on Anxious about nodule finding on CT a year ago  Significant tests/ events  07/2014 HST -AHI 30/h 09/2014 CPAP 11 cm, large FF mask PSG in 10/2010 in [place] showed severe OSA with AHI of 51 per hour and lowest desaturation 79%-weight 225 pounds. This was corrected by CPAP of 10 cm.  Review of Systems neg for any significant sore throat, dysphagia, itching, sneezing, nasal congestion or excess/ purulent secretions, fever, chills, sweats, unintended wt loss, pleuritic or exertional cp, hempoptysis, orthopnea pnd or change in chronic leg swelling. Also denies presyncope, palpitations, heartburn, abdominal pain, nausea, vomiting, diarrhea or change in bowel or urinary habits, dysuria,hematuria, rash, arthralgias, visual complaints, headache, numbness weakness or ataxia.     Objective:   Physical Exam  Gen. Pleasant, well-nourished, in no distress ENT - no lesions, no post nasal drip Neck: No JVD, no thyromegaly, no carotid bruits Lungs: no use of accessory muscles, no dullness to percussion,  clear without rales or rhonchi  Cardiovascular: Rhythm regular, heart sounds  normal, no murmurs or gallops, no peripheral edema Musculoskeletal: No deformities, no cyanosis or clubbing         Assessment & Plan:

## 2014-11-18 NOTE — Patient Instructions (Signed)
Persist with nasal pillows + chin strap Call if does not work - & we can send you to sleep lab for mask fit - oro-nasal mask Last option - cloth mask CT chest will be scheduled

## 2014-11-23 ENCOUNTER — Ambulatory Visit (HOSPITAL_COMMUNITY)
Admission: RE | Admit: 2014-11-23 | Discharge: 2014-11-23 | Disposition: A | Discharge status: Home / Self Care | Payer: 59 | Source: Ambulatory Visit | Attending: Pulmonary Disease | Admitting: Pulmonary Disease

## 2014-11-23 DIAGNOSIS — R918 Other nonspecific abnormal finding of lung field: Secondary | ICD-10-CM | POA: Diagnosis present

## 2014-11-23 DIAGNOSIS — R911 Solitary pulmonary nodule: Secondary | ICD-10-CM

## 2014-11-23 IMAGING — CT CT CHEST W/O CM
2 of 3 series · 15 of 36 positions shown, 18 images · non-contrast
Comparison: None.

CLINICAL DATA: Followup pulmonary nodules.

EXAM:
CT CHEST WITHOUT CONTRAST
TECHNIQUE: Multidetector CT imaging of the chest was performed following the
standard protocol without IV contrast.

[Series 2: thorax 5.0 i31f 1 · axial · 0.74mm/px · z∈[+1139,+1444]mm · 12 of 73 slices shown, 15 images]
[im 6/73  mediastinal]
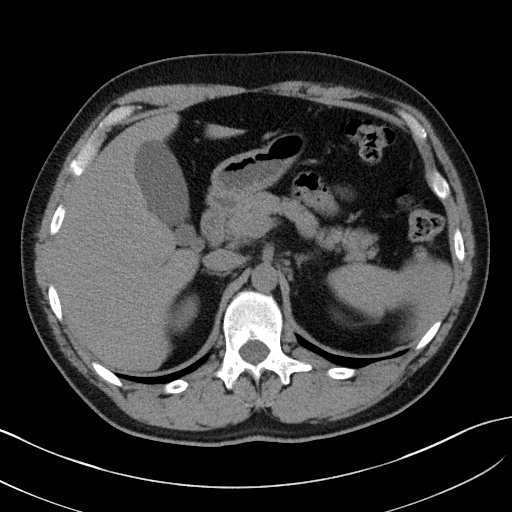
[im 6/73  lung]
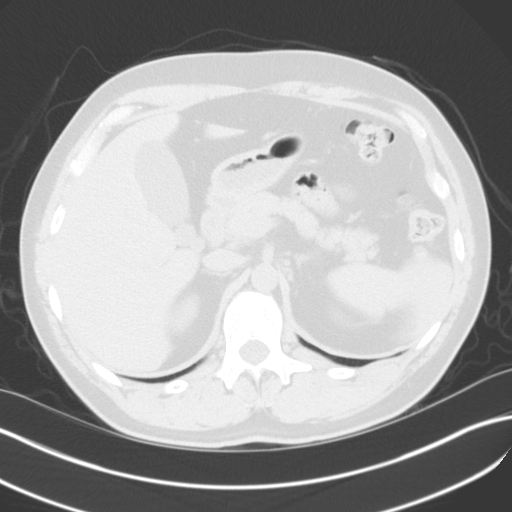
[im 11/73  lung]
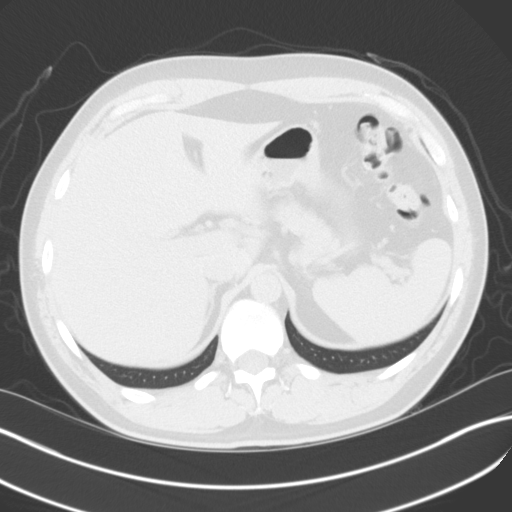
[im 17/73  lung]
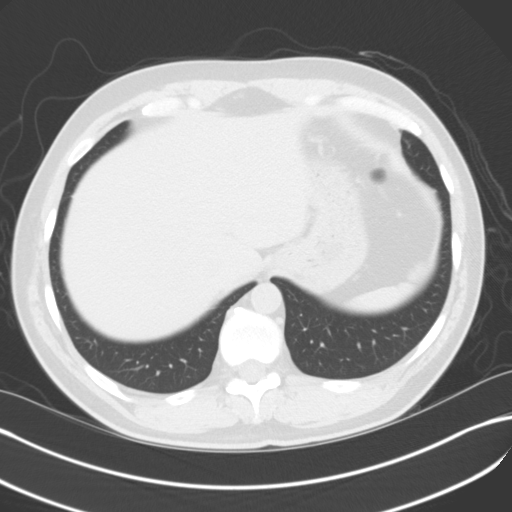
[im 22/73  lung]
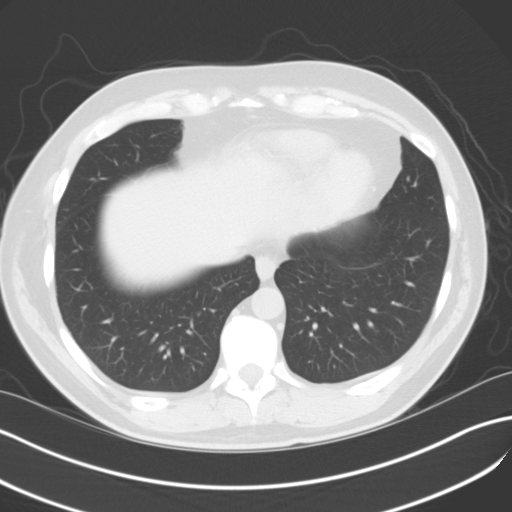
[im 27/73  mediastinal]
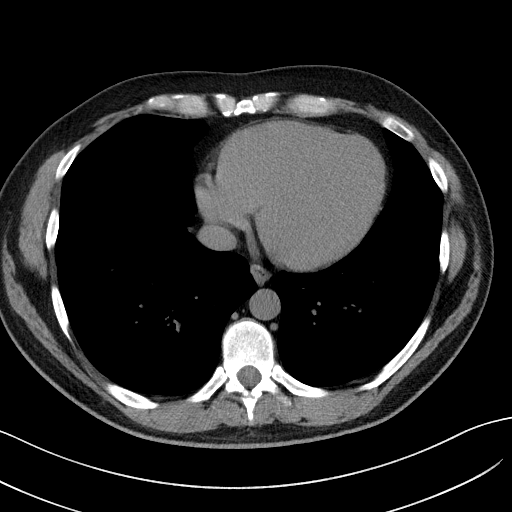
[im 27/73  lung]
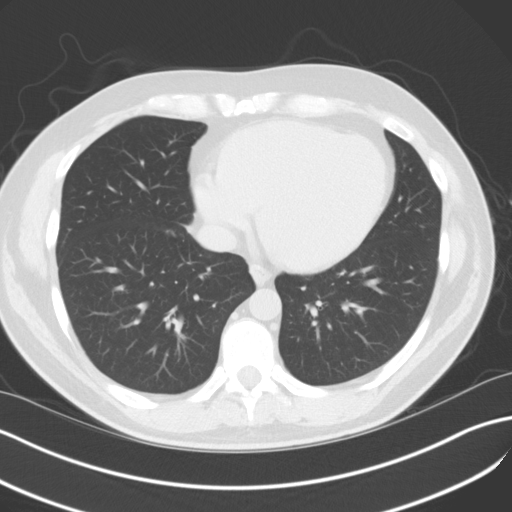
[im 33/73  lung]
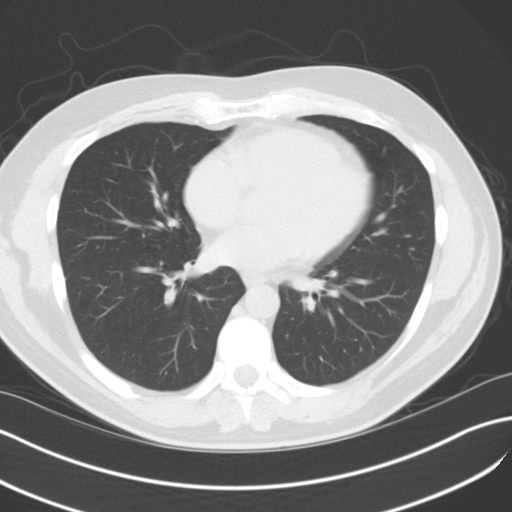
[im 41/73  lung]
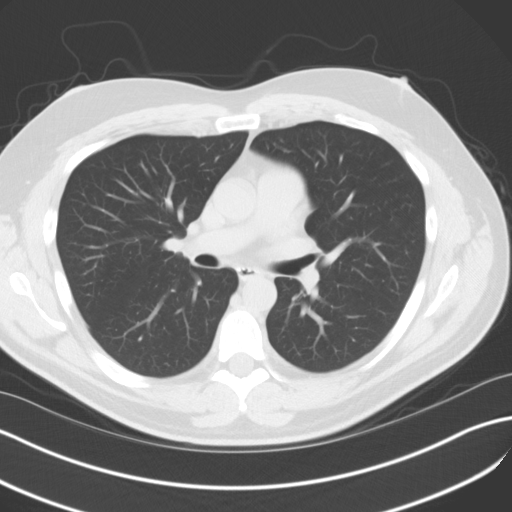
[im 46/73  lung]
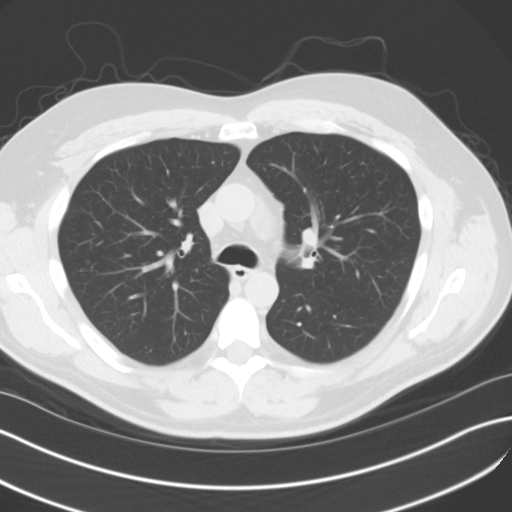
[im 51/73  mediastinal]
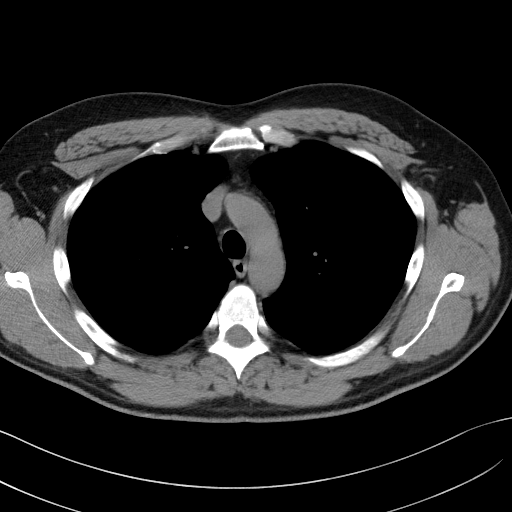
[im 51/73  lung]
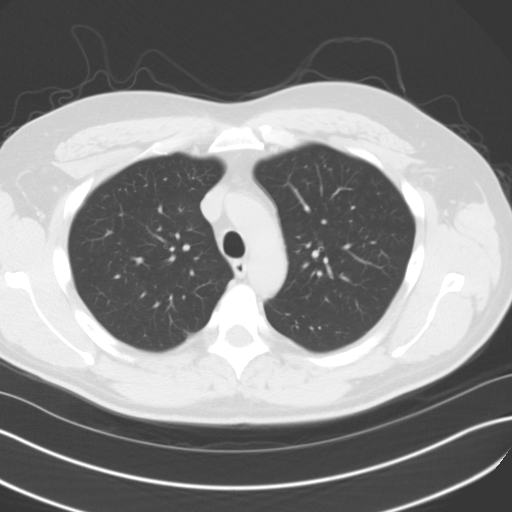
[im 57/73  lung]
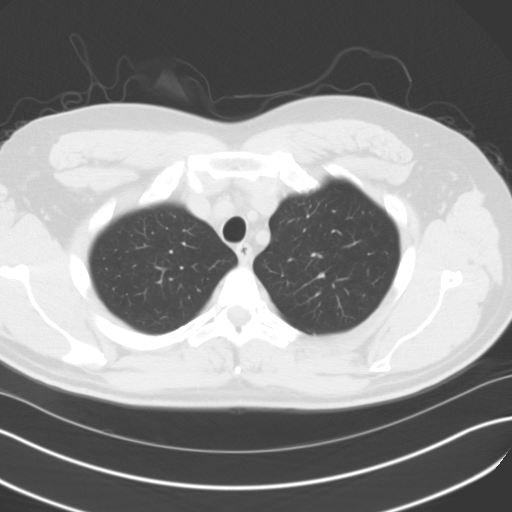
[im 62/73  lung]
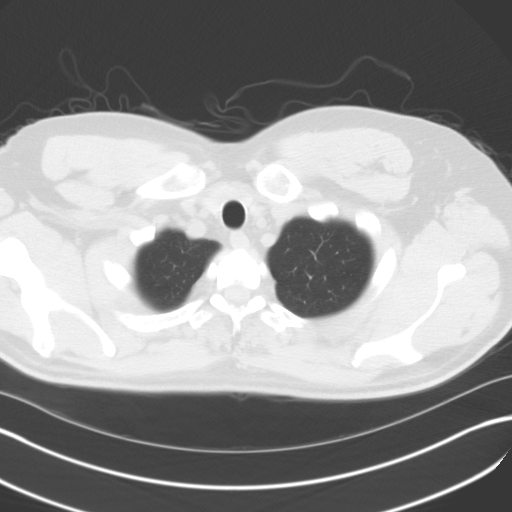
[im 67/73  lung]
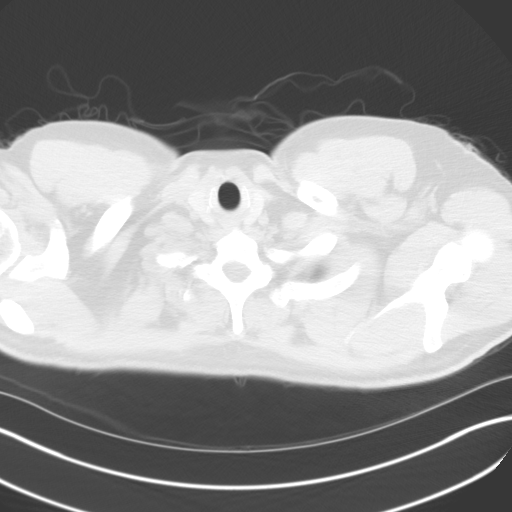

[Series 5: coronal · coronal · 0.72mm/px · 3 of 99 slices shown]
[im 20/99  lung]
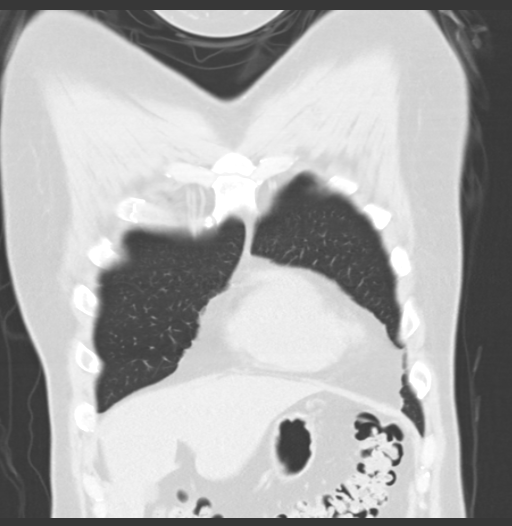
[im 40/99  lung]
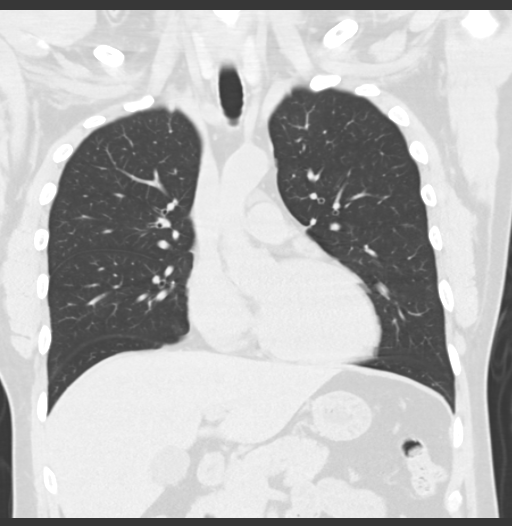
[im 59/99  lung]
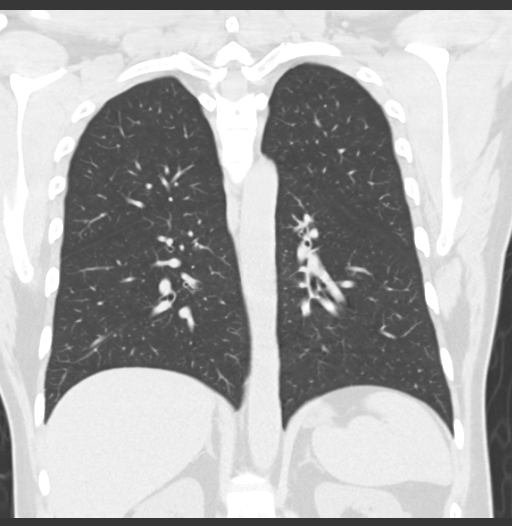

[15 of 36 positions shown; findings below may reference images not displayed]

FINDINGS: Chest wall: No chest wall mass, supraclavicular or axillary
lymphadenopathy. The thyroid gland is normal. The bony thorax is
intact.

Mediastinum: The heart is normal in size. No pericardial effusion.
The aorta is normal in caliber. No atherosclerotic calcifications.
No mediastinal or hilar mass or adenopathy. The esophagus is grossly
normal.

Lungs/ pleura: Suspect mild emphysematous changes. No acute
pulmonary findings, bronchiectasis or interstitial lung disease. No
pleural effusion. No pulmonary nodules are identified.

Upper abdomen: No significant findings. A small duodenum
diverticulum is noted near the pancreatic head.
IMPRESSION: 1. Suspect mild/early emphysematous changes.
2. No acute pulmonary findings.
3. No worrisome pulmonary lesions.
4. No mediastinal or hilar mass or adenopathy.

## 2014-12-14 ENCOUNTER — Telehealth: Payer: Self-pay | Admitting: Pulmonary Disease

## 2014-12-14 DIAGNOSIS — G4733 Obstructive sleep apnea (adult) (pediatric): Secondary | ICD-10-CM

## 2014-12-14 NOTE — Telephone Encounter (Signed)
lmomtcb x1 

## 2014-12-15 NOTE — Telephone Encounter (Signed)
Refer to sleep lab for mask fit or Ok to send Rx for cloth mask - he would have to get from [facility]

## 2014-12-15 NOTE — Telephone Encounter (Signed)
lmtcb x2 for pt. 

## 2014-12-15 NOTE — Telephone Encounter (Signed)
PT states cpap mask is causing facial irritation.  Pt tried nasal pillows but this didn't work so he went back to full face mask.  He states Dr Vassie Loll had mentioned a cloth mask.  He has not been to sleep lab for mask fitting.  Please advise next step.

## 2014-12-16 NOTE — Telephone Encounter (Signed)
Pt wishes to move forward with mask fitting at sleep center. Aware that order will be placed and someone will call him with an appt. Nothing further needed.

## 2014-12-28 ENCOUNTER — Other Ambulatory Visit (HOSPITAL_BASED_OUTPATIENT_CLINIC_OR_DEPARTMENT_OTHER): Payer: 59

## 2014-12-29 ENCOUNTER — Encounter (HOSPITAL_COMMUNITY): Payer: Self-pay | Admitting: Family Medicine

## 2014-12-29 ENCOUNTER — Emergency Department (HOSPITAL_COMMUNITY)
Admission: EM | Admit: 2014-12-29 | Discharge: 2014-12-30 | Disposition: A | Discharge status: Other Acute Inpatient Hospital | Payer: 59 | Attending: Emergency Medicine | Admitting: Emergency Medicine

## 2014-12-29 DIAGNOSIS — Z79899 Other long term (current) drug therapy: Secondary | ICD-10-CM | POA: Diagnosis not present

## 2014-12-29 DIAGNOSIS — R441 Visual hallucinations: Secondary | ICD-10-CM | POA: Diagnosis not present

## 2014-12-29 DIAGNOSIS — E780 Pure hypercholesterolemia: Secondary | ICD-10-CM | POA: Insufficient documentation

## 2014-12-29 DIAGNOSIS — Z720 Tobacco use: Secondary | ICD-10-CM | POA: Insufficient documentation

## 2014-12-29 DIAGNOSIS — R443 Hallucinations, unspecified: Secondary | ICD-10-CM

## 2014-12-29 DIAGNOSIS — I10 Essential (primary) hypertension: Secondary | ICD-10-CM | POA: Diagnosis not present

## 2014-12-29 DIAGNOSIS — G479 Sleep disorder, unspecified: Secondary | ICD-10-CM | POA: Diagnosis not present

## 2014-12-29 DIAGNOSIS — Z8719 Personal history of other diseases of the digestive system: Secondary | ICD-10-CM | POA: Insufficient documentation

## 2014-12-29 DIAGNOSIS — R440 Auditory hallucinations: Secondary | ICD-10-CM | POA: Diagnosis not present

## 2014-12-29 LAB — CBC
HEMATOCRIT: 44.1 % (ref 39.0–52.0)
Hemoglobin: 14.9 g/dL (ref 13.0–17.0)
MCH: 30.8 pg (ref 26.0–34.0)
MCHC: 33.8 g/dL (ref 30.0–36.0)
MCV: 91.1 fL (ref 78.0–100.0)
PLATELETS: 226 10*3/uL (ref 150–400)
RBC: 4.84 MIL/uL (ref 4.22–5.81)
RDW: 13.4 % (ref 11.5–15.5)
WBC: 11.1 10*3/uL — AB (ref 4.0–10.5)

## 2014-12-29 LAB — COMPREHENSIVE METABOLIC PANEL
ALK PHOS: 58 U/L (ref 38–126)
ALT: 39 U/L (ref 17–63)
ANION GAP: 12 (ref 5–15)
AST: 34 U/L (ref 15–41)
Albumin: 4.4 g/dL (ref 3.5–5.0)
BUN: 10 mg/dL (ref 6–20)
CALCIUM: 9.5 mg/dL (ref 8.9–10.3)
CO2: 22 mmol/L (ref 22–32)
Chloride: 104 mmol/L (ref 101–111)
Creatinine, Ser: 1.11 mg/dL (ref 0.61–1.24)
GFR calc non Af Amer: >60 mL/min (ref >60)
Glucose, Bld: 127 mg/dL — ABNORMAL HIGH (ref 65–99)
Potassium: 4 mmol/L (ref 3.5–5.1)
Sodium: 138 mmol/L (ref 135–145)
TOTAL PROTEIN: 7 g/dL (ref 6.5–8.1)
Total Bilirubin: 0.9 mg/dL (ref 0.3–1.2)

## 2014-12-29 LAB — RAPID URINE DRUG SCREEN, HOSP PERFORMED
Amphetamines: POSITIVE — AB
Barbiturates: NOT DETECTED
Benzodiazepines: NOT DETECTED
Cocaine: NOT DETECTED
Opiates: NOT DETECTED
TETRAHYDROCANNABINOL: NOT DETECTED

## 2014-12-29 LAB — ETHANOL: Alcohol, Ethyl (B): <5 mg/dL (ref <5)

## 2014-12-29 LAB — SALICYLATE LEVEL: Salicylate Lvl: <4 mg/dL (ref 2.8–30.0)

## 2014-12-29 LAB — ACETAMINOPHEN LEVEL

## 2014-12-29 MED ORDER — DULOXETINE HCL 60 MG PO CPEP
120.0000 mg | ORAL_CAPSULE | Freq: Every day | ORAL | Status: DC
  Filled 2014-12-29: qty 2

## 2014-12-29 MED ORDER — METHOCARBAMOL 500 MG PO TABS: 500.0000 mg | ORAL_TABLET | Freq: Three times a day (TID) | ORAL | Status: DC | PRN

## 2014-12-29 MED ORDER — GABAPENTIN 300 MG PO CAPS
300.0000 mg | ORAL_CAPSULE | Freq: Two times a day (BID) | ORAL | Status: DC
  Administered 2014-12-29: 300 mg via ORAL
  Filled 2014-12-29: qty 1

## 2014-12-29 MED ORDER — TRAZODONE HCL 100 MG PO TABS
200.0000 mg | ORAL_TABLET | Freq: Every day | ORAL | Status: DC
  Administered 2014-12-29: 200 mg via ORAL
  Filled 2014-12-29: qty 2

## 2014-12-29 MED ORDER — MIRTAZAPINE 45 MG PO TABS
45.0000 mg | ORAL_TABLET | Freq: Every day | ORAL | Status: DC
  Administered 2014-12-29: 45 mg via ORAL
  Filled 2014-12-29 (×2): qty 1

## 2014-12-29 MED ORDER — ACETAMINOPHEN 500 MG PO TABS
1000.0000 mg | ORAL_TABLET | Freq: Once | ORAL | Status: AC
  Administered 2014-12-29: 1000 mg via ORAL
  Filled 2014-12-29: qty 2

## 2014-12-29 MED ORDER — ATENOLOL 25 MG PO TABS: 100.0000 mg | ORAL_TABLET | Freq: Every morning | ORAL | Status: DC

## 2014-12-29 MED ORDER — CLONAZEPAM 0.5 MG PO TABS
1.0000 mg | ORAL_TABLET | Freq: Two times a day (BID) | ORAL | Status: DC
  Administered 2014-12-29: 1 mg via ORAL
  Filled 2014-12-29: qty 2

## 2014-12-29 MED ORDER — LORAZEPAM 1 MG PO TABS
1.0000 mg | ORAL_TABLET | Freq: Once | ORAL | Status: AC
  Administered 2014-12-29: 1 mg via ORAL
  Filled 2014-12-29: qty 1

## 2014-12-29 NOTE — ED Notes (Signed)
Pt offered snack and something to drink 

## 2014-12-29 NOTE — ED Notes (Signed)
TTS consult in progress. °

## 2014-12-29 NOTE — ED Notes (Signed)
Called and requested a sitter 
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
## 2014-12-29 NOTE — ED Notes (Signed)
With pt permission, black book bag with belongings sent home with pt father.

## 2014-12-29 NOTE — Progress Notes (Addendum)
Patient referred for psychiatric treatment at: [facility] - per intake, fax it. [facility] - per Thayer Ohm, fax referral for review. [facility] - fax referral, per intake. [facility] - per Wolverine, 1 bed open, fax it. [facility] - per intake, fax it for the waitlist. [facility] - per Rosey Bath, adolescent, adult and geriatric beds open; fax it. [facility] - per Britta Mccreedy, beds open, fax it.  At capacity: [facility]   CSW will continue to seek placement.  Melbourne Abts, LCSWA Disposition staff 12/29/2014 8:33 PM

## 2014-12-29 NOTE — ED Provider Notes (Signed)
CSN: [id number]     Arrival date & time 12/29/14  1430 History   First MD Initiated Contact with Patient 12/29/14 1438     Chief Complaint  Patient presents with  . Hallucinations     (Consider location/radiation/quality/duration/timing/severity/associated sxs/prior Treatment) HPI Comments: Patient presents to the ED with a chief complaint of auditory and visual hallucinations.  Patient states that this has been happening his whole life.  States that episodes are intermittent.  Has never been treated.  Patient states that he has not been sleeping.  Patient states that he has been under a lot of stress.  He denies SI/HI.  There are no aggravating or alleviating factors.  The history is provided by the patient. No language interpreter was used.    Past Medical History  Diagnosis Date  . Celiac disease   . Hypertension   . Pre-diabetes   . Dairy allergy   . High cholesterol    Past Surgical History  Procedure Laterality Date  . Appendectomy    . Nasal septum surgery    . Wrist fracture surgery      right wrist  . Brain surgery     Family History  Problem Relation Age of Onset  . Asthma Mother   . Hypertension Mother   . Hypertension Father    Social History  Substance Use Topics  . Smoking status: Current Every Day Smoker  . Smokeless tobacco: None     Comment: 2 cigarettes daily  . Alcohol Use: 0.0 oz/week    0 Standard drinks or equivalent per week     Comment: rarely    Review of Systems  Constitutional: Negative for fever and chills.  Respiratory: Negative for shortness of breath.   Cardiovascular: Negative for chest pain.  Gastrointestinal: Negative for nausea, vomiting, diarrhea and constipation.  Genitourinary: Negative for dysuria.  Psychiatric/Behavioral: Positive for hallucinations and sleep disturbance. Negative for self-injury.  All other systems reviewed and are negative.     Allergies  Review of patient's allergies indicates no known  allergies.  Home Medications   Prior to Admission medications   Medication Sig Start Date End Date Taking? Authorizing Provider  amphetamine-dextroamphetamine (ADDERALL) 10 MG tablet Take 30 mg by mouth 2 (two) times daily as needed.     Historical Provider, MD  aspirin-acetaminophen-caffeine (EXCEDRIN MIGRAINE) [phone] MG per tablet Take 2 tablets by mouth 2 (two) times daily as needed for migraine.    Historical Provider, MD  atenolol (TENORMIN) 100 MG tablet Take 100 mg by mouth at bedtime.     Historical Provider, MD  Buprenorphine 15 MCG/HR PTWK Place 1 patch onto the skin once a week.    Historical Provider, MD  clonazePAM (KLONOPIN) 2 MG tablet Take 2 mg by mouth 2 (two) times daily as needed for anxiety.    Historical Provider, MD  DULoxetine HCl (CYMBALTA PO) Take 120 mg by mouth daily.    Historical Provider, MD  methocarbamol (ROBAXIN) 750 MG tablet Take 750 mg by mouth 2 (two) times daily as needed for muscle spasms.    Historical Provider, MD  mirtazapine (REMERON) 45 MG tablet Take 45 mg by mouth at bedtime.    Historical Provider, MD  Misc Natural Products (JOINT SUPPORT COMPLEX PO) Take 1 tablet by mouth daily.    Historical Provider, MD  Multiple Vitamin (MULTIVITAMIN) tablet Take 1 tablet by mouth daily.    Historical Provider, MD  Omega-3 Fatty Acids (FISH OIL PO) Take 1 tablet by mouth daily.  Historical Provider, MD  OXAYDO 5 MG TABA Take 5 mg by mouth 2 (two) times daily as needed (pain).  09/24/14   Historical Provider, MD  oxymetazoline (AFRIN) 0.05 % nasal spray Place 1 spray into both nostrils daily as needed for congestion.    Historical Provider, MD  pseudoephedrine (SUDAFED) 30 MG tablet Take 30 mg by mouth daily as needed for congestion.    Historical Provider, MD  tiZANidine (ZANAFLEX) 4 MG capsule Take 1 capsule (4 mg total) by mouth 3 (three) times daily as needed (headache). 09/25/14   Jennifer Piepenbrink, PA-C  traZODone (DESYREL) 100 MG tablet Take 100 mg by  mouth at bedtime.    Historical Provider, MD   BP 147/100 mmHg  Pulse 99  Temp(Src) 98.3 F (36.8 C) (Oral)  Resp 18  SpO2 98% Physical Exam  Constitutional: He is oriented to person, place, and time. He appears well-developed and well-nourished.  HENT:  Head: Normocephalic and atraumatic.  Eyes: Conjunctivae and EOM are normal. Pupils are equal, round, and reactive to light. Right eye exhibits no discharge. Left eye exhibits no discharge. No scleral icterus.  Neck: Normal range of motion. Neck supple. No JVD present.  Cardiovascular: Normal rate, regular rhythm and normal heart sounds.  Exam reveals no gallop and no friction rub.   No murmur heard. Pulmonary/Chest: Effort normal and breath sounds normal. No respiratory distress. He has no wheezes. He has no rales. He exhibits no tenderness.  Abdominal: Soft. He exhibits no distension and no mass. There is no tenderness. There is no rebound and no guarding.  Musculoskeletal: Normal range of motion. He exhibits no edema or tenderness.  Neurological: He is alert and oriented to person, place, and time.  Skin: Skin is warm and dry.  Psychiatric: He has a normal mood and affect. His behavior is normal. Judgment and thought content normal.  Nursing note and vitals reviewed.   ED Course  Procedures (including critical care time) Labs Review Labs Reviewed  COMPREHENSIVE METABOLIC PANEL  ETHANOL  SALICYLATE LEVEL  ACETAMINOPHEN LEVEL  CBC  URINE RAPID DRUG SCREEN, HOSP PERFORMED    Imaging Review No results found.   EKG Interpretation None      MDM   Final diagnoses:  None    Patient with worsening auditory and visual hallucinations.  Greatly impacting quality of life.  No SI/HI.  TTS consult pending.  Patient is VOLUNTARY.     Roxy Horseman, PA-C 12/29/14 1536  Laurence Spates, MD 12/29/14 [phone]

## 2014-12-29 NOTE — ED Notes (Signed)
Pt here for auditory and visual hallucinations. sts has been happening his whole life off and on but never told anybody. sts the voices have been stating they want to get rid of him. sts he is getting no sleep. sts under a lot of stress.

## 2014-12-29 NOTE — Clinical Social Work Note (Signed)
Contacted ordering PA C Roxy Horseman to obtain any additional information on pt but he is gone for the day  Spoke with Burna Forts NP who stated he was not given any additional information and facilitated tele psyc computer for pt to be assessed.  Will call in 5 minutes after it is set up for pt  .Elray Buba, [place] [facility]

## 2014-12-29 NOTE — ED Notes (Signed)
Meal tray given 

## 2014-12-30 MED ORDER — LORAZEPAM 1 MG PO TABS
1.0000 mg | ORAL_TABLET | Freq: Once | ORAL | Status: AC
  Administered 2014-12-30: 1 mg via ORAL
  Filled 2014-12-30: qty 1

## 2014-12-30 NOTE — ED Notes (Signed)
Pt becoming anxious from "sitting in this room." Would like an update on possible placement.

## 2014-12-30 NOTE — Progress Notes (Signed)
LCSW called to patient room as father is not accepting of the bed at [facility]. Father reports he is old and mother is old and they have no way down there. Patient asking for pain medication as well as anxiety medication to transfer.  LCSW explained process regarding inpatient admission at [facility], visitation hours as well as code for HIPPA. Father reports, " I hate hospitals and you don't care".    LCSW tried to diffuse situation, explain mental health benefits and reasons for multiple facilities reviewing information for consideration of placement.    Patient and father finally agreeable to plan with regards to transfer by Juel Burrow to [facility]. Medication given to patient for transfer and patient left with [facility]. Father took patient's belongings.  Deretha Emory, MSW Clinical Social Work: Emergency Room [phone]

## 2014-12-30 NOTE — ED Notes (Signed)
Matthew [name] arrived patient father sent back by staff. Patient father states he does not want patient to go to [facility] after patient has consented to transfer. SW at bedside.

## 2014-12-30 NOTE — Progress Notes (Signed)
Pt accepted to [facility] per [place] in intake. Accepting MD is Dr. Roselle Locus, pt will be transferred to [facility] unit, report number is [phone]- can arrrive anytime after 9am today.  Spoke with [facility] re: pt's placement.  Ilean Skill, MSW, LCSW Clinical Social Work, Disposition  12/30/2014 [phone]

## 2014-12-30 NOTE — ED Notes (Signed)
Spoke with Tori the [facility] at [facility] and pt does not have a bed at [facility] as previously thought.  The plan is for him to be admitted to an inpatient unit, if [facility] has a discharge tomorrow, they are willing to accept.  Pt has signed the voluntary consent for treatment form as well as the transfer form on the computer if he does go to [facility].

## 2014-12-30 NOTE — ED Notes (Signed)
Patient gave belongings to family.

## 2014-12-30 NOTE — ED Notes (Signed)
Meal tray ordered 

## 2015-01-18 ENCOUNTER — Encounter: Payer: Self-pay | Admitting: Adult Health

## 2015-01-18 ENCOUNTER — Ambulatory Visit (INDEPENDENT_AMBULATORY_CARE_PROVIDER_SITE_OTHER): Payer: 59 | Admitting: Adult Health

## 2015-01-18 VITALS — BP 120/88 | HR 87 | Temp 98.0°F | Ht 75.0 in | Wt 251.0 lb

## 2015-01-18 DIAGNOSIS — R911 Solitary pulmonary nodule: Secondary | ICD-10-CM

## 2015-01-18 DIAGNOSIS — G4733 Obstructive sleep apnea (adult) (pediatric): Secondary | ICD-10-CM | POA: Diagnosis not present

## 2015-01-18 NOTE — Patient Instructions (Signed)
Follow up with sleep lab for new mask fitting  Try to wear CPAP At bedtime  , goal is every night for at least 4hrs  Do not drive if sleepy.  follow up Dr. Vassie Loll  In 2-3 months and As needed

## 2015-01-18 NOTE — Assessment & Plan Note (Signed)
Severe OSA w/ CPAP non compliance with ongoing mask issues  Encouraged on mask compliance  Pt education on CPAP and OSA  Keep follow up with sleep lab to help with new mask   Plan  Follow up with sleep lab for new mask fitting  Try to wear CPAP At bedtime  , goal is every night for at least 4hrs  Do not drive if sleepy.  follow up Dr. Vassie Loll  In 2-3 months and As needed

## 2015-01-18 NOTE — Progress Notes (Signed)
   Subjective:    Patient ID: Matthew [name], male    DOB: 1975/11/30, 39 y.o.   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  He also has celiac disease that is diet-controlled, chronic anxiety on clonazepam and ADHD on Adderall. He reports finding of a nodule in the right upper lobe-the CT scanner was not formally reported, and is not available. He smokes about 3-5 cigarettes daily    Significant tests/ events  07/2014 HST -AHI 30/h 09/2014 CPAP 11 cm, large FF mask PSG in 10/2010 in [place] showed severe OSA with AHI of 51 per hour and lowest desaturation 79%-weight 225 pounds. This was corrected by CPAP of 10 cm.   01/18/2015 Follow up : OSA and Lung nodule  Pt returns for 3 month follow up .  On nocturnal CPAP for OSA  Download shows poor compliance .  37% usage with avg usage at ~4 hr when worn.  Says feels mask is uncomfortable. Feels mask burns face.  Got new mask with new pads and he is going to try these  Has tried nasal pillows but did not like those.  He is an appointment set up with sleep lab to try new mask.  Pt states breathing is doing well. Pt states no new complaints.    Was set up for  A CT chest last ov due to concern for possible lung nodule  CT chest 11/23/14 showed no worrisome lesions, ?mild emphysematous changes .  Having difficulties with admission to behavior health center for bipolar /schizoaffective disorder .  Started on zyprexa . Feels mood is better.     Review of Systems neg for any significant sore throat, dysphagia, itching, sneezing, nasal congestion or excess/ purulent secretions, fever, chills, sweats, unintended wt loss, pleuritic or exertional cp, hempoptysis, orthopnea pnd or change in chronic leg swelling. Also denies presyncope, palpitations, heartburn, abdominal pain, nausea, vomiting, diarrhea or change in bowel or urinary habits, dysuria,hematuria, rash, arthralgias, visual  complaints, headache, numbness weakness or ataxia.     Objective:   Physical Exam  Gen. Pleasant, well-nourished, in no distress ENT - no lesions, no post nasal drip Neck: No JVD, no thyromegaly, no carotid bruits Lungs: no use of accessory muscles, no dullness to percussion, clear without rales or rhonchi  Cardiovascular: Rhythm regular, heart sounds  normal, no murmurs or gallops, no peripheral edema Musculoskeletal: No deformities, no cyanosis or clubbing         Assessment & Plan:

## 2015-01-18 NOTE — Assessment & Plan Note (Signed)
Recent CT chest showed no evidence of pulmonary nodule

## 2015-01-18 NOTE — Assessment & Plan Note (Signed)
Work on weight loss.

## 2015-01-20 NOTE — Progress Notes (Signed)
Reviewed & agree with plan  

## 2015-01-26 ENCOUNTER — Ambulatory Visit (HOSPITAL_BASED_OUTPATIENT_CLINIC_OR_DEPARTMENT_OTHER): Payer: 59 | Attending: Pulmonary Disease | Admitting: Radiology

## 2015-01-26 DIAGNOSIS — G4733 Obstructive sleep apnea (adult) (pediatric): Secondary | ICD-10-CM

## 2015-01-26 DIAGNOSIS — Z9989 Dependence on other enabling machines and devices: Principal | ICD-10-CM

## 2015-02-08 ENCOUNTER — Encounter (HOSPITAL_COMMUNITY): Payer: Self-pay

## 2015-02-08 ENCOUNTER — Ambulatory Visit (HOSPITAL_COMMUNITY)
Admission: RE | Admit: 2015-02-08 | Discharge: 2015-02-08 | Disposition: A | Discharge status: Home / Self Care | Payer: 59 | Source: Home / Self Care | Attending: Psychiatry | Admitting: Psychiatry

## 2015-02-08 ENCOUNTER — Encounter (HOSPITAL_COMMUNITY): Payer: Self-pay | Admitting: *Deleted

## 2015-02-08 ENCOUNTER — Emergency Department (HOSPITAL_COMMUNITY)
Admission: EM | Admit: 2015-02-08 | Discharge: 2015-02-08 | Disposition: A | Discharge status: Other Acute Inpatient Hospital | Payer: 59 | Attending: Emergency Medicine | Admitting: Emergency Medicine

## 2015-02-08 ENCOUNTER — Encounter (HOSPITAL_COMMUNITY): Payer: Self-pay | Admitting: Emergency Medicine

## 2015-02-08 ENCOUNTER — Inpatient Hospital Stay (HOSPITAL_COMMUNITY)
Admission: AD | Admit: 2015-02-08 | Discharge: 2015-02-11 | DRG: 885 | Disposition: A | Discharge status: Home / Self Care | Payer: 59 | Source: Intra-hospital | Attending: Psychiatry | Admitting: Psychiatry

## 2015-02-08 DIAGNOSIS — Z720 Tobacco use: Secondary | ICD-10-CM | POA: Diagnosis not present

## 2015-02-08 DIAGNOSIS — F29 Unspecified psychosis not due to a substance or known physiological condition: Secondary | ICD-10-CM | POA: Insufficient documentation

## 2015-02-08 DIAGNOSIS — G8929 Other chronic pain: Secondary | ICD-10-CM | POA: Diagnosis present

## 2015-02-08 DIAGNOSIS — F1510 Other stimulant abuse, uncomplicated: Secondary | ICD-10-CM | POA: Diagnosis not present

## 2015-02-08 DIAGNOSIS — G4700 Insomnia, unspecified: Secondary | ICD-10-CM | POA: Diagnosis present

## 2015-02-08 DIAGNOSIS — K900 Celiac disease: Secondary | ICD-10-CM | POA: Diagnosis present

## 2015-02-08 DIAGNOSIS — F259 Schizoaffective disorder, unspecified: Principal | ICD-10-CM | POA: Diagnosis present

## 2015-02-08 DIAGNOSIS — R440 Auditory hallucinations: Secondary | ICD-10-CM | POA: Diagnosis present

## 2015-02-08 DIAGNOSIS — I10 Essential (primary) hypertension: Secondary | ICD-10-CM | POA: Insufficient documentation

## 2015-02-08 DIAGNOSIS — F419 Anxiety disorder, unspecified: Secondary | ICD-10-CM | POA: Diagnosis present

## 2015-02-08 DIAGNOSIS — E780 Pure hypercholesterolemia: Secondary | ICD-10-CM | POA: Diagnosis not present

## 2015-02-08 DIAGNOSIS — F250 Schizoaffective disorder, bipolar type: Secondary | ICD-10-CM

## 2015-02-08 DIAGNOSIS — F329 Major depressive disorder, single episode, unspecified: Secondary | ICD-10-CM | POA: Insufficient documentation

## 2015-02-08 DIAGNOSIS — Z8719 Personal history of other diseases of the digestive system: Secondary | ICD-10-CM | POA: Diagnosis not present

## 2015-02-08 DIAGNOSIS — Z79899 Other long term (current) drug therapy: Secondary | ICD-10-CM | POA: Insufficient documentation

## 2015-02-08 DIAGNOSIS — G4733 Obstructive sleep apnea (adult) (pediatric): Secondary | ICD-10-CM | POA: Diagnosis present

## 2015-02-08 DIAGNOSIS — F258 Other schizoaffective disorders: Secondary | ICD-10-CM

## 2015-02-08 DIAGNOSIS — F17210 Nicotine dependence, cigarettes, uncomplicated: Secondary | ICD-10-CM | POA: Diagnosis present

## 2015-02-08 DIAGNOSIS — Z7951 Long term (current) use of inhaled steroids: Secondary | ICD-10-CM | POA: Diagnosis not present

## 2015-02-08 HISTORY — DX: Depression, unspecified: F32.A

## 2015-02-08 HISTORY — DX: Anxiety disorder, unspecified: F41.9

## 2015-02-08 HISTORY — DX: Major depressive disorder, single episode, unspecified: F32.9

## 2015-02-08 HISTORY — DX: Delusional disorders: F22

## 2015-02-08 HISTORY — DX: Schizoaffective disorder, unspecified: F25.9

## 2015-02-08 LAB — COMPREHENSIVE METABOLIC PANEL
ALBUMIN: 4.4 g/dL (ref 3.5–5.0)
ALK PHOS: 81 U/L (ref 38–126)
ALT: 57 U/L (ref 17–63)
ANION GAP: 8 (ref 5–15)
AST: 45 U/L — ABNORMAL HIGH (ref 15–41)
BILIRUBIN TOTAL: 0.7 mg/dL (ref 0.3–1.2)
BUN: 12 mg/dL (ref 6–20)
CALCIUM: 9.8 mg/dL (ref 8.9–10.3)
CO2: 28 mmol/L (ref 22–32)
Chloride: 103 mmol/L (ref 101–111)
Creatinine, Ser: 1.18 mg/dL (ref 0.61–1.24)
GFR calc Af Amer: >60 mL/min (ref >60)
Glucose, Bld: 135 mg/dL — ABNORMAL HIGH (ref 65–99)
Potassium: 4.3 mmol/L (ref 3.5–5.1)
Sodium: 139 mmol/L (ref 135–145)
TOTAL PROTEIN: 7.6 g/dL (ref 6.5–8.1)

## 2015-02-08 LAB — CBC
HCT: 43.4 % (ref 39.0–52.0)
Hemoglobin: 14.4 g/dL (ref 13.0–17.0)
MCH: 29.8 pg (ref 26.0–34.0)
MCHC: 33.2 g/dL (ref 30.0–36.0)
MCV: 89.7 fL (ref 78.0–100.0)
Platelets: 216 10*3/uL (ref 150–400)
RBC: 4.84 MIL/uL (ref 4.22–5.81)
RDW: 13.2 % (ref 11.5–15.5)
WBC: 8.7 10*3/uL (ref 4.0–10.5)

## 2015-02-08 LAB — RAPID URINE DRUG SCREEN, HOSP PERFORMED
AMPHETAMINES: POSITIVE — AB
Barbiturates: NOT DETECTED
Benzodiazepines: NOT DETECTED
COCAINE: NOT DETECTED
OPIATES: NOT DETECTED
Tetrahydrocannabinol: NOT DETECTED

## 2015-02-08 LAB — ACETAMINOPHEN LEVEL

## 2015-02-08 MED ORDER — ACETAMINOPHEN 500 MG PO TABS
1000.0000 mg | ORAL_TABLET | Freq: Once | ORAL | Status: AC
  Administered 2015-02-08: 1000 mg via ORAL
  Filled 2015-02-08: qty 2

## 2015-02-08 MED ORDER — TRAZODONE HCL 100 MG PO TABS: 200.0000 mg | ORAL_TABLET | Freq: Every day | ORAL | Status: DC

## 2015-02-08 MED ORDER — ZOLPIDEM TARTRATE 5 MG PO TABS: 5.0000 mg | ORAL_TABLET | Freq: Every evening | ORAL | Status: DC | PRN

## 2015-02-08 MED ORDER — NICOTINE 21 MG/24HR TD PT24
21.0000 mg | MEDICATED_PATCH | Freq: Every day | TRANSDERMAL | Status: DC
  Administered 2015-02-08: 21 mg via TRANSDERMAL
  Filled 2015-02-08: qty 1

## 2015-02-08 MED ORDER — GABAPENTIN 300 MG PO CAPS
300.0000 mg | ORAL_CAPSULE | Freq: Three times a day (TID) | ORAL | Status: DC
  Administered 2015-02-08: 300 mg via ORAL
  Filled 2015-02-08: qty 1

## 2015-02-08 MED ORDER — OMEGA-3-ACID ETHYL ESTERS 1 G PO CAPS: 1.0000 g | ORAL_CAPSULE | Freq: Every day | ORAL | Status: DC

## 2015-02-08 MED ORDER — DULOXETINE HCL 60 MG PO CPEP
120.0000 mg | ORAL_CAPSULE | Freq: Every day | ORAL | Status: DC
  Administered 2015-02-09: 120 mg via ORAL
  Filled 2015-02-08 (×4): qty 2

## 2015-02-08 MED ORDER — OXYCODONE-ACETAMINOPHEN 10-325 MG PO TABS: 1.0000 | ORAL_TABLET | Freq: Four times a day (QID) | ORAL | Status: DC | PRN

## 2015-02-08 MED ORDER — AMPHETAMINE-DEXTROAMPHETAMINE 10 MG PO TABS: 30.0000 mg | ORAL_TABLET | Freq: Two times a day (BID) | ORAL | Status: DC | PRN

## 2015-02-08 MED ORDER — IBUPROFEN 200 MG PO TABS: 600.0000 mg | ORAL_TABLET | Freq: Three times a day (TID) | ORAL | Status: DC | PRN

## 2015-02-08 MED ORDER — ACETAMINOPHEN 325 MG PO TABS
650.0000 mg | ORAL_TABLET | Freq: Four times a day (QID) | ORAL | Status: DC | PRN
  Administered 2015-02-09 – 2015-02-11 (×2): 650 mg via ORAL
  Filled 2015-02-08 (×2): qty 2

## 2015-02-08 MED ORDER — CLONAZEPAM 1 MG PO TABS
1.0000 mg | ORAL_TABLET | Freq: Two times a day (BID) | ORAL | Status: DC | PRN
  Administered 2015-02-09: 1 mg via ORAL
  Filled 2015-02-08: qty 1

## 2015-02-08 MED ORDER — TIZANIDINE HCL 4 MG PO TABS
4.0000 mg | ORAL_TABLET | Freq: Three times a day (TID) | ORAL | Status: DC | PRN
  Filled 2015-02-08: qty 1

## 2015-02-08 MED ORDER — OXYCODONE-ACETAMINOPHEN 5-325 MG PO TABS
1.0000 | ORAL_TABLET | Freq: Four times a day (QID) | ORAL | Status: DC | PRN
  Administered 2015-02-08 – 2015-02-09 (×2): 1 via ORAL
  Filled 2015-02-08 (×2): qty 1

## 2015-02-08 MED ORDER — ATENOLOL 100 MG PO TABS
100.0000 mg | ORAL_TABLET | Freq: Every morning | ORAL | Status: DC
  Administered 2015-02-09 – 2015-02-11 (×3): 100 mg via ORAL
  Filled 2015-02-08 (×3): qty 1
  Filled 2015-02-08: qty 2
  Filled 2015-02-08 (×2): qty 1
  Filled 2015-02-08: qty 2

## 2015-02-08 MED ORDER — TIZANIDINE HCL 4 MG PO TABS
4.0000 mg | ORAL_TABLET | Freq: Three times a day (TID) | ORAL | Status: DC | PRN
  Administered 2015-02-10 – 2015-02-11 (×5): 4 mg via ORAL
  Filled 2015-02-08: qty 1
  Filled 2015-02-08 (×3): qty 2
  Filled 2015-02-08: qty 1
  Filled 2015-02-08: qty 2
  Filled 2015-02-08: qty 1
  Filled 2015-02-08: qty 2
  Filled 2015-02-08 (×3): qty 1

## 2015-02-08 MED ORDER — ALUM & MAG HYDROXIDE-SIMETH 200-200-20 MG/5ML PO SUSP: 30.0000 mL | ORAL | Status: DC | PRN

## 2015-02-08 MED ORDER — FLUTICASONE PROPIONATE 50 MCG/ACT NA SUSP: 1.0000 | NASAL | Status: DC | PRN

## 2015-02-08 MED ORDER — ATENOLOL 100 MG PO TABS: 100.0000 mg | ORAL_TABLET | Freq: Every morning | ORAL | Status: DC

## 2015-02-08 MED ORDER — MAGNESIUM HYDROXIDE 400 MG/5ML PO SUSP: 30.0000 mL | Freq: Every day | ORAL | Status: DC | PRN

## 2015-02-08 MED ORDER — TRAZODONE HCL 100 MG PO TABS
200.0000 mg | ORAL_TABLET | Freq: Every day | ORAL | Status: DC
  Administered 2015-02-08: 200 mg via ORAL
  Filled 2015-02-08 (×4): qty 2

## 2015-02-08 MED ORDER — MIRTAZAPINE 30 MG PO TABS
45.0000 mg | ORAL_TABLET | Freq: Every day | ORAL | Status: DC
  Administered 2015-02-08: 45 mg via ORAL
  Filled 2015-02-08: qty 2

## 2015-02-08 MED ORDER — OXYCODONE HCL 5 MG PO TABS
5.0000 mg | ORAL_TABLET | Freq: Four times a day (QID) | ORAL | Status: DC | PRN
  Administered 2015-02-08: 5 mg via ORAL
  Filled 2015-02-08: qty 1

## 2015-02-08 MED ORDER — GABAPENTIN 300 MG PO CAPS
300.0000 mg | ORAL_CAPSULE | Freq: Three times a day (TID) | ORAL | Status: DC
  Administered 2015-02-08 – 2015-02-11 (×8): 300 mg via ORAL
  Filled 2015-02-08 (×13): qty 1

## 2015-02-08 MED ORDER — ONDANSETRON HCL 4 MG PO TABS: 4.0000 mg | ORAL_TABLET | Freq: Three times a day (TID) | ORAL | Status: DC | PRN

## 2015-02-08 MED ORDER — MIRTAZAPINE 45 MG PO TABS
45.0000 mg | ORAL_TABLET | Freq: Every day | ORAL | Status: DC
  Administered 2015-02-08: 45 mg via ORAL
  Filled 2015-02-08: qty 3
  Filled 2015-02-08 (×3): qty 1

## 2015-02-08 MED ORDER — OLANZAPINE 7.5 MG PO TABS
15.0000 mg | ORAL_TABLET | Freq: Two times a day (BID) | ORAL | Status: DC
  Administered 2015-02-09: 15 mg via ORAL
  Filled 2015-02-08: qty 2
  Filled 2015-02-08: qty 6
  Filled 2015-02-08 (×4): qty 2

## 2015-02-08 MED ORDER — LORAZEPAM 1 MG PO TABS: 1.0000 mg | ORAL_TABLET | Freq: Three times a day (TID) | ORAL | Status: DC | PRN

## 2015-02-08 MED ORDER — OLANZAPINE 5 MG PO TABS: 15.0000 mg | ORAL_TABLET | Freq: Two times a day (BID) | ORAL | Status: DC

## 2015-02-08 MED ORDER — DULOXETINE HCL 60 MG PO CPEP: 120.0000 mg | ORAL_CAPSULE | Freq: Every day | ORAL | Status: DC

## 2015-02-08 MED ORDER — NICOTINE POLACRILEX 2 MG MT GUM
2.0000 mg | CHEWING_GUM | OROMUCOSAL | Status: DC | PRN
  Administered 2015-02-09 – 2015-02-11 (×3): 2 mg via ORAL
  Filled 2015-02-08 (×3): qty 1

## 2015-02-08 NOTE — ED Provider Notes (Signed)
CSN: [id number]     Arrival date & time 02/08/15  1743 History   First MD Initiated Contact with Patient 02/08/15 2005     Chief Complaint  Patient presents with  . Hallucinations  . Depression  . Anxiety     (Consider location/radiation/quality/duration/timing/severity/associated sxs/prior Treatment) HPI Comments: 39 year old male with a past medical history of hypertension, high cholesterol, schizoaffective disorder, depression, anxiety, paranoia, prediabetes and celiac disease presenting with auditory hallucinations, worsening depression, anxiety and difficulty sleeping over the past week. States he is hearing voices that are calling him names such as "retard and fag". Also hearing a lot of mumbling while he is at home with no Delton See the house. Has slept about an hour each day. States he has several stressors including finances and relationships. Was recently admitted to [facility] and started on new medications that he does not believe her helping including Zyprexa. He is taking all his medications as prescribed. He was at work today when his supervisor noticed that he was tired, the patient told him what was going on so he brought in to the ED. He has had a TTS evaluation and was sent here for medical clearance. Denies suicidal or homicidal ideations.  The history is provided by the patient.    Past Medical History  Diagnosis Date  . Celiac disease   . Hypertension   . Pre-diabetes   . Dairy allergy   . High cholesterol   . Schizoaffective disorder   . Depression   . Anxiety   . Paranoia    Past Surgical History  Procedure Laterality Date  . Appendectomy    . Nasal septum surgery    . Wrist fracture surgery      right wrist  . Brain surgery     Family History  Problem Relation Age of Onset  . Asthma Mother   . Hypertension Mother   . Hypertension Father    Social History  Substance Use Topics  . Smoking status: Current Every Day Smoker -- 0.25 packs/day    Types:  Cigarettes  . Smokeless tobacco: None  . Alcohol Use: No     Comment: no (02/08/15)    Review of Systems  Constitutional: Positive for fatigue.  Psychiatric/Behavioral: Positive for hallucinations, sleep disturbance, dysphoric mood and decreased concentration.  All other systems reviewed and are negative.     Allergies  Gluten meal  Home Medications   Prior to Admission medications   Medication Sig Start Date End Date Taking? Authorizing Provider  amphetamine-dextroamphetamine (ADDERALL) 30 MG tablet Take 30 mg by mouth 2 (two) times daily as needed (for mental clarity).   Yes Historical Provider, MD  atenolol (TENORMIN) 100 MG tablet Take 100 mg by mouth every morning.    Yes Historical Provider, MD  clonazePAM (KLONOPIN) 1 MG tablet Take 1 mg by mouth 2 (two) times daily as needed for anxiety.    Yes Historical Provider, MD  DULoxetine (CYMBALTA) 60 MG capsule Take 120 mg by mouth daily.   Yes Historical Provider, MD  fluticasone (FLONASE) 50 MCG/ACT nasal spray Place 1 spray into both nostrils as needed for allergies or rhinitis.   Yes Historical Provider, MD  gabapentin (NEURONTIN) 300 MG capsule Take 300 mg by mouth 3 (three) times daily.    Yes Historical Provider, MD  mirtazapine (REMERON) 45 MG tablet Take 45 mg by mouth at bedtime.   Yes Historical Provider, MD  OLANZapine (ZYPREXA) 15 MG tablet Take 15 mg by mouth 2 (two) times  daily. 01/15/15  Yes Historical Provider, MD  Omega-3 Fatty Acids (FISH OIL PO) Take 1 capsule by mouth 2 (two) times daily.   Yes Historical Provider, MD  oxyCODONE-acetaminophen (PERCOCET) 10-325 MG per tablet Take 1 tablet by mouth every 4 (four) hours as needed for pain.   Yes Historical Provider, MD  pseudoephedrine (SUDAFED) 60 MG tablet Take 60 mg by mouth every 4 (four) hours as needed for congestion.   Yes Historical Provider, MD  tiZANidine (ZANAFLEX) 4 MG capsule Take 1 capsule (4 mg total) by mouth 3 (three) times daily as needed (headache).  09/25/14  Yes Jennifer Piepenbrink, PA-C  traZODone (DESYREL) 100 MG tablet Take 200 mg by mouth at bedtime.   Yes Historical Provider, MD   BP 145/87 mmHg  Pulse 87  Temp(Src) 98.4 F (36.9 C) (Oral)  Resp 16  SpO2 97% Physical Exam  Constitutional: He is oriented to person, place, and time. He appears well-developed and well-nourished. No distress.  HENT:  Head: Normocephalic and atraumatic.  Eyes: Conjunctivae and EOM are normal.  Neck: Normal range of motion. Neck supple.  Cardiovascular: Normal rate, regular rhythm and normal heart sounds.   Pulmonary/Chest: Effort normal and breath sounds normal.  Musculoskeletal: Normal range of motion. He exhibits no edema.  Neurological: He is alert and oriented to person, place, and time.  Skin: Skin is warm and dry.  Psychiatric: His behavior is normal. He exhibits a depressed mood. He expresses no homicidal and no suicidal ideation.  Nursing note and vitals reviewed.   ED Course  Procedures (including critical care time) Labs Review Labs Reviewed  COMPREHENSIVE METABOLIC PANEL - Abnormal; Notable for the following:    Glucose, Bld 135 (*)    AST 45 (*)    All other components within normal limits  ACETAMINOPHEN LEVEL - Abnormal; Notable for the following:    Acetaminophen (Tylenol), Serum <10 (*)    All other components within normal limits  URINE RAPID DRUG SCREEN, HOSP PERFORMED - Abnormal; Notable for the following:    Amphetamines POSITIVE (*)    All other components within normal limits  CBC    Imaging Review No results found. I have personally reviewed and evaluated these images and lab results as part of my medical decision-making.   EKG Interpretation None      MDM   Final diagnoses:  Auditory hallucinations   Non-toxic appearing, NAD. AFVSS. Medically cleared. Pt accepted to [facility].    Kathrynn Speed, PA-C 02/09/15 0025  Richardean Canal, MD 02/09/15 [phone]

## 2015-02-08 NOTE — ED Notes (Signed)
Registation with pt at this time, will collect labs when they are complete.

## 2015-02-08 NOTE — ED Notes (Signed)
Pt c/o auditory hallucinations that are calling him names, depression and anxiety  Increasing x 1 week.  Pt reports he has had several recent stressor including financial and relationships.  Sts recent admission to [facility] and started on new medications.  Pt reports taking all medication as prescribed.  Denies SI/HI.    Pt has already been seen by TTS and sent to [facility] for Medical Clearance.

## 2015-02-08 NOTE — ED Notes (Signed)
Report called to [facility] Adult unit at [facility].

## 2015-02-08 NOTE — Tx Team (Signed)
Initial Interdisciplinary Treatment Plan   PATIENT STRESSORS: Medication change or noncompliance Occupational concerns   PATIENT STRENGTHS: Ability for insight Active sense of humor Average or above average intelligence Capable of independent living Communication skills Financial means General fund of knowledge Motivation for treatment/growth   PROBLEM LIST: Problem List/Patient Goals Date to be addressed Date deferred Reason deferred Estimated date of resolution  Auditory hallucinations 02/08/15     "Getting my meds correct and feeling better" 02/08/15                                                DISCHARGE CRITERIA:  Ability to meet basic life and health needs Improved stabilization in mood, thinking, and/or behavior Verbal commitment to aftercare and medication compliance  PRELIMINARY DISCHARGE PLAN: Attend aftercare/continuing care group Return to previous living arrangement  PATIENT/FAMIILY INVOLVEMENT: This treatment plan has been presented to and reviewed with the patient, Matthew [name], and/or family member, .  The patient and family have been given the opportunity to ask questions and make suggestions.  [place], [place] 02/08/2015, 11:47 PM

## 2015-02-08 NOTE — Progress Notes (Signed)
39 year old male pt admitted on voluntary basis. Pt reports on-going auditory hallucinations and reports that he feels they are getting worse. Pt denies any depression and denies SI and able to contract for safety on the unit. Pt reports that he was hospitalized at [facility] recently and reports that all they did was increase the dosage on his current medications. Pt reports having a difficult time sleeping recently and only getting a couple of hours a night. Pt reports that he lives with his parents and will go back there after discharge. Pt was oriented to the unit, a meal provided, and safety maintained.

## 2015-02-08 NOTE — BH Assessment (Addendum)
Assessment Note  Matthew [name] is an 39 y.o. male who presents voluntarily to [facility] as a walk-in. Pt presented with his mgr, Verdell Carmine, and co-worker, Toni Amend, and requested that they stay in the assessment with him.  Pt works at [employer]. Pt denied SI and HI. He shared that he does hear voices. He indicated that the voices do not tell him to do anything, but always say negative things to him ("fag"; "fucking retard"). Pt also indicated that sometimes the voices sound like a "mumbling behind the wall". Pt also shared that he keeps having thoughts that people are out to get him. Pt shared that he recently had an IP stay at [facility] in August and was diagnosed with schizoaffective d/o. Pt indicated that he takes several psychotropic medications and that he is compliant with taking them. Pt shared that he was doing fine until a couple of weeks ago when he started back hearing voices and losing sleep. Pt reported sleeping 1.5 hours/night. Pt denied substance abuse/alcohol use or abuse. Pt denied hx of violence.   Pt shared that he has an appt with [facility] office on Thursday b/c he called them with his complaints. Pt indicated that he came as a walk-in today, prior to his appt,due to the urging of his supervisor and co-workers who felt that he needed urgent help and were concerned for his wellbeing.   Pt presented with a depressed mood and affect. He was oriented x 4.   Axis I: Schizoaffective Disorder Axis II: Deferred Axis III:  Past Medical History  Diagnosis Date  . Celiac disease   . Hypertension   . Pre-diabetes   . Dairy allergy   . High cholesterol    Axis IV: other psychosocial or environmental problems Axis V: 21-30 behavior considerably influenced by delusions or hallucinations OR serious impairment in judgment, communication OR inability to function in almost all areas  Past Medical History:  Past Medical History  Diagnosis Date  . Celiac disease   .  Hypertension   . Pre-diabetes   . Dairy allergy   . High cholesterol     Past Surgical History  Procedure Laterality Date  . Appendectomy    . Nasal septum surgery    . Wrist fracture surgery      right wrist  . Brain surgery      Family History:  Family History  Problem Relation Age of Onset  . Asthma Mother   . Hypertension Mother   . Hypertension Father     Social History:  reports that he quit smoking about 2 months ago. He does not have any smokeless tobacco history on file. He reports that he drinks alcohol. He reports that he does not use illicit drugs.  Additional Social History:  Alcohol / Drug Use Pain Medications: see MAR Prescriptions: see MAR Over the Counter: see MAR History of alcohol / drug use?: No history of alcohol / drug abuse  CIWA:   COWS:    Allergies:  Allergies  Allergen Reactions  . Gluten Meal Other (See Comments)    Celiac disease    Home Medications:  (Not in a hospital admission)  OB/GYN Status:  No LMP for male patient.  General Assessment Data Location of Assessment: [facility] Assessment Services (walk-in) TTS Assessment: In system Is this a Tele or Face-to-Face Assessment?: Face-to-Face Is this an Initial Assessment or a Re-assessment for this encounter?: Initial Assessment Marital status: Single Is patient pregnant?: No Pregnancy Status: No  Living Arrangements: Parent Can pt return to current living arrangement?: Yes Admission Status: Voluntary Is patient capable of signing voluntary admission?: Yes Referral Source: Self/Family/Friend Insurance type: UMR ([employer])  Medical Screening Exam [facility] Walk-in ONLY) Medical Exam completed: No Reason for MSE not completed:  (Pt is a walk-in. OTW to [facility] for med clearance.)  Crisis Care Plan Living Arrangements: Parent Name of Psychiatrist: Emerson Monte  Education Status Is patient currently in school?: No  Risk to self with the past 6 months Suicidal Ideation: No Has patient  been a risk to self within the past 6 months prior to admission? : Yes Has patient had any suicidal intent within the past 6 months prior to admission? : No Is patient at risk for suicide?: No Suicidal Plan?: No Has patient had any suicidal plan within the past 6 months prior to admission? : No Access to Means: No What has been your use of drugs/alcohol within the last 12 months?: no hx Previous Attempts/Gestures: No Intentional Self Injurious Behavior: None Family Suicide History: Unknown Recent stressful life event(s): Other (Comment) (Return to work) Persecutory voices/beliefs?: Yes Depression: Yes Depression Symptoms: Despondent, Insomnia Substance abuse history and/or treatment for substance abuse?: No Suicide prevention information given to non-admitted patients: Not applicable  Risk to Others within the past 6 months Homicidal Ideation: No Does patient have any lifetime risk of violence toward others beyond the six months prior to admission? : No Thoughts of Harm to Others: No Current Homicidal Intent: No Current Homicidal Plan: No Access to Homicidal Means: No History of harm to others?: No Assessment of Violence: None Noted Does patient have access to weapons?: No Criminal Charges Pending?: No Does patient have a court date: No Is patient on probation?: No  Psychosis Hallucinations: Auditory Delusions: None noted  Mental Status Report Appearance/Hygiene: Unremarkable Eye Contact: Fair Motor Activity: Unremarkable Speech: Soft, Logical/coherent Level of Consciousness: Alert Mood: Depressed, Sad Affect: Appropriate to circumstance, Depressed Anxiety Level: Minimal Thought Processes: Coherent, Relevant Judgement: Unimpaired Orientation: Person, Place, Time, Situation Obsessive Compulsive Thoughts/Behaviors: Minimal  Cognitive Functioning Concentration: Decreased Memory: Recent Intact, Remote Intact IQ: Average Insight: Good Impulse Control: Good Appetite:  Poor Sleep: Decreased Total Hours of Sleep: 2 Vegetative Symptoms: None  ADLScreening [facility] Assessment Services) Patient's cognitive ability adequate to safely complete daily activities?: Yes Patient able to express need for assistance with ADLs?: Yes Independently performs ADLs?: Yes (appropriate for developmental age)  Prior Inpatient Therapy Prior Inpatient Therapy: Yes Prior Therapy Dates: 12/2014 Prior Therapy Facilty/Provider(s): [facility] Reason for Treatment: SI  Prior Outpatient Therapy Prior Outpatient Therapy: Yes Prior Therapy Dates: years ago Prior Therapy Facilty/Provider(s): OP agency in [place] Reason for Treatment: Depression Does patient have an ACCT team?: No Does patient have Intensive In-House Services?  : No Does patient have [facility] services? : No Does patient have P4CC services?: No  ADL Screening (condition at time of admission) Patient's cognitive ability adequate to safely complete daily activities?: Yes Is the patient deaf or have difficulty hearing?: No Does the patient have difficulty seeing, even when wearing glasses/contacts?: No Does the patient have difficulty concentrating, remembering, or making decisions?: No Patient able to express need for assistance with ADLs?: Yes Does the patient have difficulty dressing or bathing?: No Independently performs ADLs?: Yes (appropriate for developmental age) Does the patient have difficulty walking or climbing stairs?: No Weakness of Legs: None Weakness of Arms/Hands: None  Home Assistive Devices/Equipment Home Assistive Devices/Equipment: None  Therapy Consults (therapy consults require a physician order)  PT Evaluation Needed: No OT Evalulation Needed: No SLP Evaluation Needed: No Abuse/Neglect Assessment (Assessment to be complete while patient is alone) Physical Abuse: Denies Verbal Abuse: Yes, past (Comment), Yes, present (Comment) Sexual Abuse: Denies Exploitation of patient/patient's  resources: Denies Self-Neglect: Denies Values / Beliefs Cultural Requests During Hospitalization: None Spiritual Requests During Hospitalization: None Consults Spiritual Care Consult Needed: No Social Work Consult Needed: No      Additional Information 1:1 In Past 12 Months?: No CIRT Risk: No Elopement Risk: No Does patient have medical clearance?: No     Disposition:  Disposition Initial Assessment Completed for this Encounter: Yes Disposition of Patient: Inpatient treatment program (Per Fransisca Kaufmann, NP) Type of inpatient treatment program: Adult  On Site Evaluation by:   Reviewed with Physician:    Laddie Aquas 02/08/2015 6:23 PM

## 2015-02-09 DIAGNOSIS — F29 Unspecified psychosis not due to a substance or known physiological condition: Secondary | ICD-10-CM

## 2015-02-09 DIAGNOSIS — G4700 Insomnia, unspecified: Secondary | ICD-10-CM

## 2015-02-09 DIAGNOSIS — F250 Schizoaffective disorder, bipolar type: Secondary | ICD-10-CM

## 2015-02-09 MED ORDER — DULOXETINE HCL 60 MG PO CPEP
60.0000 mg | ORAL_CAPSULE | Freq: Two times a day (BID) | ORAL | Status: DC
  Administered 2015-02-09 – 2015-02-11 (×4): 60 mg via ORAL
  Filled 2015-02-09 (×8): qty 1

## 2015-02-09 MED ORDER — CLONAZEPAM 0.5 MG PO TABS
0.5000 mg | ORAL_TABLET | Freq: Three times a day (TID) | ORAL | Status: DC | PRN
  Administered 2015-02-09 – 2015-02-11 (×5): 0.5 mg via ORAL
  Filled 2015-02-09 (×7): qty 1

## 2015-02-09 MED ORDER — OXYCODONE HCL 5 MG PO TABS
5.0000 mg | ORAL_TABLET | Freq: Three times a day (TID) | ORAL | Status: DC | PRN
  Administered 2015-02-09 – 2015-02-11 (×6): 5 mg via ORAL
  Filled 2015-02-09 (×6): qty 1

## 2015-02-09 MED ORDER — TRAZODONE HCL 100 MG PO TABS
100.0000 mg | ORAL_TABLET | Freq: Every evening | ORAL | Status: DC | PRN
  Administered 2015-02-09: 100 mg via ORAL
  Filled 2015-02-09: qty 1

## 2015-02-09 MED ORDER — ARIPIPRAZOLE 5 MG PO TABS
5.0000 mg | ORAL_TABLET | Freq: Every day | ORAL | Status: DC
  Administered 2015-02-10 – 2015-02-11 (×2): 5 mg via ORAL
  Filled 2015-02-09 (×4): qty 1

## 2015-02-09 MED ORDER — PANTOPRAZOLE SODIUM 20 MG PO TBEC
20.0000 mg | DELAYED_RELEASE_TABLET | Freq: Every day | ORAL | Status: DC
  Administered 2015-02-09 – 2015-02-11 (×3): 20 mg via ORAL
  Filled 2015-02-09 (×6): qty 1

## 2015-02-09 NOTE — [facility] Group Notes (Signed)
[facility] LCSW Group Therapy  02/09/2015 2:02 PM  Type of Therapy: Group Therapy  Participation Level:Invited. Chose not to attend.  Vito Backers. Beverely Pace 02/09/2015 2:02 PM

## 2015-02-09 NOTE — Progress Notes (Signed)
D. Pt present with depressed mood on the unit today. Pt has been visible in the milieu  And has been observed interacting with peers and staff. Pt complained of chronic back pain, medication given as ordered. Pt stated he had a poor night sleep, fair appetite, low energy, and poor concentration. Pt stated his gaol for today is " laughing, having fun, smile" Pt is cooperative with his medication and unit rules. Pt reported that his depression was a 2, his hopelessness was a 2, and that his anxiety was a 5. Pt reported being negative SI/HI, no AH/VH noted. A: 15 min checks continued for patient safety. R: Pts safety maintained.

## 2015-02-09 NOTE — BHH Suicide Risk Assessment (Signed)
[facility] Admission Suicide Risk Assessment   Nursing information obtained from:   patient/ chart  Demographic factors:   39 year old male , lives with parents, employed  Current Mental Status:   see below  Loss Factors:   insomnia affecting his ability to function, mental illness  Historical Factors:   history of psychosis, depression, sleep apnea  Risk Reduction Factors:   employed, resilience  Total Time spent with patient: 45 minutes Principal Problem:  Schizoaffective Disorder, Insomnia  Diagnosis:   Patient Active Problem List   Diagnosis Date Noted  . Schizo-affective schizophrenia, chronic condition with acute exacerbation [F25.8] 02/08/2015  . Morbid obesity [E66.01] 01/18/2015  . OSA (obstructive sleep apnea) [G47.33] 07/14/2014  . Solitary pulmonary nodule [R91.1] 07/14/2014     Continued Clinical Symptoms:  Alcohol Use Disorder Identification Test Final Score (AUDIT): 1 The "Alcohol Use Disorders Identification Test", Guidelines for Use in Primary Care, Second Edition.  World Science writer [facility]). Score between 0-7:  no or low risk or alcohol related problems. Score between 8-15:  moderate risk of alcohol related problems. Score between 16-19:  high risk of alcohol related problems. Score 20 or above:  warrants further diagnostic evaluation for alcohol dependence and treatment.   CLINICAL FACTORS:  Patient is a 39 year old man, presents with depression,  Hypercritical and demeaning auditory hallucinations, and long history of insomnia.      Psychiatric Specialty Exam: Physical Exam  ROS  Blood pressure 137/90, pulse 71, temperature 97.8 F (36.6 C), temperature source Oral, resp. rate 18, height  (1.905 m), weight 264 lb (119.75 kg).Body mass index is 33 kg/(m^2).   See admit note MSE   COGNITIVE FEATURES THAT CONTRIBUTE TO RISK:  Closed-mindedness and Loss of executive function    SUICIDE RISK:   Moderate:  Frequent suicidal ideation with limited  intensity, and duration, some specificity in terms of plans, no associated intent, good self-control, limited dysphoria/symptomatology, some risk factors present, and identifiable protective factors, including available and accessible social support.  PLAN OF CARE: Patient will be admitted to inpatient psychiatric unit for stabilization and safety. Will provide and encourage milieu participation. Provide medication management and maked adjustments as needed.  Will follow daily.    Medical Decision Making:  Review of Psycho-Social Stressors (1), Review or order clinical lab tests (1), Established Problem, Worsening (2) and Review of Medication Regimen & Side Effects (2)  I certify that inpatient services furnished can reasonably be expected to improve the patient's condition.   COBOS, FERNANDO 02/09/2015, 1:10 PM

## 2015-02-09 NOTE — Progress Notes (Signed)
Adult Psychoeducational Group Note  Date:  02/09/2015 Time:  9:03 PM  Group Topic/Focus:  Wrap-Up Group:   The focus of this group is to help patients review their daily goal of treatment and discuss progress on daily workbooks.  Participation Level:  Active  Participation Quality:  Appropriate  Affect:  Appropriate  Cognitive:  Appropriate  Insight: Appropriate  Engagement in Group:  Engaged  Modes of Intervention:  Discussion  Additional Comments:The patient expressed that he rates his day a 5.The patient also said that his day was a OK.  Matthew [name] 02/09/2015, 9:03 PM

## 2015-02-09 NOTE — H&P (Addendum)
Psychiatric Admission Assessment Adult  Patient Identification: Matthew [name] MRN:  [id number] Date of Evaluation:  02/09/2015 Chief Complaint:   " I can't sleep and I am hearing voices" Principal Diagnosis:  Psychosis / Insomnia Diagnosis:   Patient Active Problem List   Diagnosis Date Noted  . Schizo-affective schizophrenia, chronic condition with acute exacerbation [F25.8] 02/08/2015  . Morbid obesity [E66.01] 01/18/2015  . OSA (obstructive sleep apnea) [G47.33] 07/14/2014  . Solitary pulmonary nodule [R91.1] 07/14/2014   History of Present Illness:: 39 year old male, employed. States he presented to the hospital because his co workers were concerned about his lack of sleep/ severe insomnia . Patient reports severe insomnia,with multiple episodes of waking up during the night ( he has been diagnosed with Obstructive Sleep Apnea - uses CPAP) . He describes auditory hallucinations, which are hypercritical- questioning his sexuality, calling him mentally retarded. He also often hears mumbling through walls . He reports occasionally seeing " shadows ". He endorses some degree of depression, but emphasizes that his symptoms and decreased energy are mostly related to insomnia.  He reports he had a prior psychiatric admission about one month ago at another hospital, for similar symptoms. His medications were adjusted and he reports he has been compliant. He reports he had initially felt better but about 1-2 weeks ago he started experiencing hallucinations again and hallucinations worsened  Again.  Elements:  Patient reports chronic insomnia, and history of psychosis which have tended to worsen / present during periods of severe insomnia. At this time reports worsening/severe symptoms.  Associated Signs/Symptoms: Depression Symptoms:   Describes mild sadness, but denies anhedonia or any severe neuro-vegetative symptoms, other than insomnia .  (Hypo) Manic Symptoms:  Denies and does not appear  manic or hypomanic at this time Anxiety Symptoms:   Endorses mild anxiety related to insomnia, mostly Psychotic Symptoms:  (+) auditory hallucinations, no delusions expressed  PTSD Symptoms: denies  Total Time spent with patient: 45 minutes   Past Psychiatric History- prior psychiatric admission at [facility] a few weeks ago. He reports long history of insomnia and history of psychosis. As per chart , he has been diagnosed with Schizoaffective Disorder, and has history of OSA, for which he uses a CPAP machine . Denies any history of suicide attempts, denies any history of violence or HI, denies any history suggestive of mania .   Past Medical History: History of Obstructive Sleep Apnea.  Past Medical History  Diagnosis Date  . Celiac disease   . Hypertension   . Pre-diabetes   . Dairy allergy   . High cholesterol   . Schizoaffective disorder   . Depression   . Anxiety   . Paranoia     Past Surgical History  Procedure Laterality Date  . Appendectomy    . Nasal septum surgery    . Wrist fracture surgery      right wrist  . Brain surgery     Family History:  Lives with parents, good relationship .  2 siblings . Denies history of mental illness in family. Family History  Problem Relation Age of Onset  . Asthma Mother   . Hypertension Mother   . Hypertension Father    Social History: lives with parents, he is employed as a Chartered loss adjuster, single, no children, no legal issues . History  Alcohol Use No    Comment: no (02/08/15)     History  Drug Use No    Social History   Social History  .  Marital Status: Single    Spouse Name: N/A  . Number of Children: 0  . Years of Education: N/A   Occupational History  . [employer]    Social History Main Topics  . Smoking status: Current Every Day Smoker -- 0.25 packs/day    Types: Cigarettes  . Smokeless tobacco: None  . Alcohol Use: No     Comment: no (02/08/15)  . Drug Use: No  . Sexual Activity: Not Asked   Other  Topics Concern  . None   Social History Narrative   Additional Social History:   Musculoskeletal: Strength & Muscle Tone: within normal limits Gait & Station: normal Patient leans: N/A  Psychiatric Specialty Exam: Physical Exam  Review of Systems  Constitutional: Negative.   Eyes: Negative.   Respiratory: Negative.   Cardiovascular: Negative.   Gastrointestinal: Negative.   Genitourinary: Negative.   Musculoskeletal: Positive for back pain.  Skin: Negative.   Neurological: Negative.   Endo/Heme/Allergies: Negative.   Psychiatric/Behavioral: Positive for depression and hallucinations.  All other systems reviewed and are negative.   Blood pressure 137/90, pulse 71, temperature 97.8 F (36.6 C), temperature source Oral, resp. rate 18, height '6\' 3"'  (1.905 m), weight 264 lb (119.75 kg).Body mass index is 33 kg/(m^2).  General Appearance: Casual and Fairly Groomed  Eye Contact::  Good  Speech:  Normal Rate  Volume:  Normal  Mood:  mildly depressed, but affect reactive, appropriate   Affect:  Congruent and reactive  Thought Process:  Linear  Orientation:  Full (Time, Place, and Person)  Thought Content:  reports auditory hallucinations, often critical and deprecating/insulting, does not appear internally preoccupied, no delusions expressed   Suicidal Thoughts:  No- denies any self injurious or suicidal ideations   Homicidal Thoughts:  No denies any violent or homicidal ideations  Memory:  recent and remote grossly intact   Judgement:  Fair  Insight:  Present  Psychomotor Activity:  Normal  Concentration:  Good  Recall:  Good  Fund of Knowledge:Good  Language: Good  Akathisia:  Negative  Handed:  Right  AIMS (if indicated):     Assets:  Desire for Improvement Housing Resilience Social Support Vocational/Educational  ADL's:  Good   Cognition: WNL  Sleep:  Number of Hours: 6   Risk to Self: Is patient at risk for suicide?: No Risk to Others:   Prior Inpatient  Therapy:   Prior Outpatient Therapy:    Alcohol Screening: 1. How often do you have a drink containing alcohol?: Monthly or less 2. How many drinks containing alcohol do you have on a typical day when you are drinking?: 1 or 2 3. How often do you have six or more drinks on one occasion?: Never Preliminary Score: 0 9. Have you or someone else been injured as a result of your drinking?: No 10. Has a relative or friend or a doctor or another health worker been concerned about your drinking or suggested you cut down?: No Alcohol Use Disorder Identification Test Final Score (AUDIT): 1 Brief Intervention: AUDIT score less than 7 or less-screening does not suggest unhealthy drinking-brief intervention not indicated  Allergies:   Allergies  Allergen Reactions  . Gluten Meal Other (See Comments)    Celiac disease   Lab Results:  Results for orders placed or performed during the hospital encounter of 02/08/15 (from the past 48 hour(s))  Urine rapid drug screen (hosp performed) (Not at [facility])     Status: Abnormal   Collection Time: 02/08/15  6:54 PM  Result Value Ref Range   Opiates NONE DETECTED NONE DETECTED   Cocaine NONE DETECTED NONE DETECTED   Benzodiazepines NONE DETECTED NONE DETECTED   Amphetamines POSITIVE (A) NONE DETECTED   Tetrahydrocannabinol NONE DETECTED NONE DETECTED   Barbiturates NONE DETECTED NONE DETECTED    Comment:        DRUG SCREEN FOR MEDICAL PURPOSES ONLY.  IF CONFIRMATION IS NEEDED FOR ANY PURPOSE, NOTIFY LAB WITHIN 5 DAYS.        LOWEST DETECTABLE LIMITS FOR URINE DRUG SCREEN Drug Class       Cutoff (ng/mL) Amphetamine      1000 Barbiturate      200 Benzodiazepine   280 Tricyclics       034 Opiates          300 Cocaine          300 THC              50   Comprehensive metabolic panel     Status: Abnormal   Collection Time: 02/08/15  7:01 PM  Result Value Ref Range   Sodium 139 135 - 145 mmol/L   Potassium 4.3 3.5 - 5.1 mmol/L   Chloride 103 101 - 111  mmol/L   CO2 28 22 - 32 mmol/L   Glucose, Bld 135 (H) 65 - 99 mg/dL   BUN 12 6 - 20 mg/dL   Creatinine, Ser 1.18 0.61 - 1.24 mg/dL   Calcium 9.8 8.9 - 10.3 mg/dL   Total Protein 7.6 6.5 - 8.1 g/dL   Albumin 4.4 3.5 - 5.0 g/dL   AST 45 (H) 15 - 41 U/L   ALT 57 17 - 63 U/L   Alkaline Phosphatase 81 38 - 126 U/L   Total Bilirubin 0.7 0.3 - 1.2 mg/dL   GFR calc non Af Amer >60 >60 mL/min   GFR calc Af Amer >60 >60 mL/min    Comment: (NOTE) The eGFR has been calculated using the CKD EPI equation. This calculation has not been validated in all clinical situations. eGFR's persistently <60 mL/min signify possible Chronic Kidney Disease.    Anion gap 8 5 - 15  Acetaminophen level     Status: Abnormal   Collection Time: 02/08/15  7:01 PM  Result Value Ref Range   Acetaminophen (Tylenol), Serum <10 (L) 10 - 30 ug/mL    Comment:        THERAPEUTIC CONCENTRATIONS VARY SIGNIFICANTLY. A RANGE OF 10-30 ug/mL MAY BE AN EFFECTIVE CONCENTRATION FOR MANY PATIENTS. HOWEVER, SOME ARE BEST TREATED AT CONCENTRATIONS OUTSIDE THIS RANGE. ACETAMINOPHEN CONCENTRATIONS >150 ug/mL AT 4 HOURS AFTER INGESTION AND >50 ug/mL AT 12 HOURS AFTER INGESTION ARE OFTEN ASSOCIATED WITH TOXIC REACTIONS.   CBC     Status: None   Collection Time: 02/08/15  7:01 PM  Result Value Ref Range   WBC 8.7 4.0 - 10.5 K/uL   RBC 4.84 4.22 - 5.81 MIL/uL   Hemoglobin 14.4 13.0 - 17.0 g/dL   HCT 43.4 39.0 - 52.0 %   MCV 89.7 78.0 - 100.0 fL   MCH 29.8 26.0 - 34.0 pg   MCHC 33.2 30.0 - 36.0 g/dL   RDW 13.2 11.5 - 15.5 %   Platelets 216 150 - 400 K/uL   Current Medications: Current Facility-Administered Medications  Medication Dose Route Frequency Provider Last Rate Last Dose  . acetaminophen (TYLENOL) tablet 650 mg  650 mg Oral Q6H PRN Laverle Hobby, PA-C      . alum & Iris Pert  hydroxide-simeth (MAALOX/MYLANTA) 200-200-20 MG/5ML suspension 30 mL  30 mL Oral Q4H PRN Laverle Hobby, PA-C      .  amphetamine-dextroamphetamine (ADDERALL) tablet 30 mg  30 mg Oral BID PRN Laverle Hobby, PA-C      . atenolol (TENORMIN) tablet 100 mg  100 mg Oral q morning - 10a Laverle Hobby, PA-C   100 mg at 02/09/15 0949  . clonazePAM (KLONOPIN) tablet 1 mg  1 mg Oral BID PRN Laverle Hobby, PA-C   1 mg at 02/09/15 0950  . DULoxetine (CYMBALTA) DR capsule 120 mg  120 mg Oral Daily Laverle Hobby, PA-C   120 mg at 02/09/15 1191  . fluticasone (FLONASE) 50 MCG/ACT nasal spray 1 spray  1 spray Each Nare PRN Laverle Hobby, PA-C      . gabapentin (NEURONTIN) capsule 300 mg  300 mg Oral TID Laverle Hobby, PA-C   300 mg at 02/09/15 1200  . magnesium hydroxide (MILK OF MAGNESIA) suspension 30 mL  30 mL Oral Daily PRN Laverle Hobby, PA-C      . mirtazapine (REMERON) tablet 45 mg  45 mg Oral QHS Laverle Hobby, PA-C   45 mg at 02/08/15 2344  . nicotine polacrilex (NICORETTE) gum 2 mg  2 mg Oral PRN Ursula Alert, MD      . OLANZapine (ZYPREXA) tablet 15 mg  15 mg Oral BID Laverle Hobby, PA-C   15 mg at 02/09/15 0813  . oxyCODONE-acetaminophen (PERCOCET/ROXICET) 5-325 MG per tablet 1 tablet  1 tablet Oral Q6H PRN Ursula Alert, MD   1 tablet at 02/09/15 0816   And  . oxyCODONE (Oxy IR/ROXICODONE) immediate release tablet 5 mg  5 mg Oral Q6H PRN Ursula Alert, MD   5 mg at 02/08/15 2344  . tiZANidine (ZANAFLEX) tablet 4 mg  4 mg Oral TID PRN Laverle Hobby, PA-C      . traZODone (DESYREL) tablet 200 mg  200 mg Oral QHS Laverle Hobby, PA-C   200 mg at 02/08/15 2344   PTA Medications: Prescriptions prior to admission  Medication Sig Dispense Refill Last Dose  . amphetamine-dextroamphetamine (ADDERALL) 30 MG tablet Take 30 mg by mouth 2 (two) times daily as needed (for mental clarity).   02/08/2015 at Unknown time  . atenolol (TENORMIN) 100 MG tablet Take 100 mg by mouth every morning.    02/08/2015 at 0600  . clonazePAM (KLONOPIN) 1 MG tablet Take 1 mg by mouth 2 (two) times daily as needed for  anxiety.    02/08/2015 at Unknown time  . DULoxetine (CYMBALTA) 60 MG capsule Take 120 mg by mouth daily.   02/08/2015 at Unknown time  . fluticasone (FLONASE) 50 MCG/ACT nasal spray Place 1 spray into both nostrils as needed for allergies or rhinitis.   02/07/2015 at Unknown time  . gabapentin (NEURONTIN) 300 MG capsule Take 300 mg by mouth 3 (three) times daily.    02/06/2015  . mirtazapine (REMERON) 45 MG tablet Take 45 mg by mouth at bedtime.   02/07/2015 at Unknown time  . OLANZapine (ZYPREXA) 15 MG tablet Take 15 mg by mouth 2 (two) times daily.  0 02/08/2015 at Unknown time  . Omega-3 Fatty Acids (FISH OIL PO) Take 1 capsule by mouth 2 (two) times daily.   02/08/2015 at Unknown time  . oxyCODONE-acetaminophen (PERCOCET) 10-325 MG per tablet Take 1 tablet by mouth every 4 (four) hours as needed for pain.   02/08/2015 at Unknown time  .  pseudoephedrine (SUDAFED) 60 MG tablet Take 60 mg by mouth every 4 (four) hours as needed for congestion.   a week  . tiZANidine (ZANAFLEX) 4 MG capsule Take 1 capsule (4 mg total) by mouth 3 (three) times daily as needed (headache). 20 capsule 0 Past Week at Unknown time  . traZODone (DESYREL) 100 MG tablet Take 200 mg by mouth at bedtime.   02/07/2015 at Unknown time    Previous Psychotropic Medications: he has been on Zyprexa, Cymbalta, Klonopin, of note , he has also been on Sudafed and on Adderall.   Substance Abuse History in the last 12 months:   Denies alcohol or drug abuse or dependencies     Consequences of Substance Abuse: denies   Results for orders placed or performed during the hospital encounter of 02/08/15 (from the past 72 hour(s))  Urine rapid drug screen (hosp performed) (Not at [facility])     Status: Abnormal   Collection Time: 02/08/15  6:54 PM  Result Value Ref Range   Opiates NONE DETECTED NONE DETECTED   Cocaine NONE DETECTED NONE DETECTED   Benzodiazepines NONE DETECTED NONE DETECTED   Amphetamines POSITIVE (A) NONE DETECTED    Tetrahydrocannabinol NONE DETECTED NONE DETECTED   Barbiturates NONE DETECTED NONE DETECTED    Comment:        DRUG SCREEN FOR MEDICAL PURPOSES ONLY.  IF CONFIRMATION IS NEEDED FOR ANY PURPOSE, NOTIFY LAB WITHIN 5 DAYS.        LOWEST DETECTABLE LIMITS FOR URINE DRUG SCREEN Drug Class       Cutoff (ng/mL) Amphetamine      1000 Barbiturate      200 Benzodiazepine   384 Tricyclics       536 Opiates          300 Cocaine          300 THC              50   Comprehensive metabolic panel     Status: Abnormal   Collection Time: 02/08/15  7:01 PM  Result Value Ref Range   Sodium 139 135 - 145 mmol/L   Potassium 4.3 3.5 - 5.1 mmol/L   Chloride 103 101 - 111 mmol/L   CO2 28 22 - 32 mmol/L   Glucose, Bld 135 (H) 65 - 99 mg/dL   BUN 12 6 - 20 mg/dL   Creatinine, Ser 1.18 0.61 - 1.24 mg/dL   Calcium 9.8 8.9 - 10.3 mg/dL   Total Protein 7.6 6.5 - 8.1 g/dL   Albumin 4.4 3.5 - 5.0 g/dL   AST 45 (H) 15 - 41 U/L   ALT 57 17 - 63 U/L   Alkaline Phosphatase 81 38 - 126 U/L   Total Bilirubin 0.7 0.3 - 1.2 mg/dL   GFR calc non Af Amer >60 >60 mL/min   GFR calc Af Amer >60 >60 mL/min    Comment: (NOTE) The eGFR has been calculated using the CKD EPI equation. This calculation has not been validated in all clinical situations. eGFR's persistently <60 mL/min signify possible Chronic Kidney Disease.    Anion gap 8 5 - 15  Acetaminophen level     Status: Abnormal   Collection Time: 02/08/15  7:01 PM  Result Value Ref Range   Acetaminophen (Tylenol), Serum <10 (L) 10 - 30 ug/mL    Comment:        THERAPEUTIC CONCENTRATIONS VARY SIGNIFICANTLY. A RANGE OF 10-30 ug/mL MAY BE AN EFFECTIVE CONCENTRATION FOR MANY PATIENTS. HOWEVER,  SOME ARE BEST TREATED AT CONCENTRATIONS OUTSIDE THIS RANGE. ACETAMINOPHEN CONCENTRATIONS >150 ug/mL AT 4 HOURS AFTER INGESTION AND >50 ug/mL AT 12 HOURS AFTER INGESTION ARE OFTEN ASSOCIATED WITH TOXIC REACTIONS.   CBC     Status: None   Collection Time:  02/08/15  7:01 PM  Result Value Ref Range   WBC 8.7 4.0 - 10.5 K/uL   RBC 4.84 4.22 - 5.81 MIL/uL   Hemoglobin 14.4 13.0 - 17.0 g/dL   HCT 43.4 39.0 - 52.0 %   MCV 89.7 78.0 - 100.0 fL   MCH 29.8 26.0 - 34.0 pg   MCHC 33.2 30.0 - 36.0 g/dL   RDW 13.2 11.5 - 15.5 %   Platelets 216 150 - 400 K/uL    Observation Level/Precautions:  15 minute checks  Laboratory:  TSH, A1C, EKG, Lipid panel, Prolactin level  Psychotherapy:  Milieu and group support with individual packets and activities  Medications: Patient is on significant number of medications- we discussed    Will not renew Adderall, Sudafed, as may be contributing to insomnia and psychotic symptoms Will continue Neurontin for pain and anxiety, and consider gradual titration as tolerated  Would D/C  Zyprexa due to potential  For metabolic side effects and persistence of psychotic symptoms in spite of high dose Taper Oxycodone PRNs to minimize sedation and drug drug interactions- of note, patient expresses a desire to taper off this medication gradually Start Abilify 5 mgrs QDAY initially Continue Cymbalta 60 mgrs BID for depression and chronic pain D/C Remeron Decrease Trazodone to 100 mgrs QHS PRN Insomnia Continue CPAP use     Consultations: As needed    Discharge Concerns: Will continue to evaluate daily   Estimated LOS:  5-7 days  Other:  N/A   Psychological Evaluations: No   Treatment Plan Summary: Daily contact with patient to assess and evaluate symptoms and progress in treatment, Medication management, Plan inpatient admission and medications as above   Medical Decision Making:  Review of Psycho-Social Stressors (1), Review or order clinical lab tests (1), Established Problem, Worsening (2) and Review of New Medication or Change in Dosage (2)  I certify that inpatient services furnished can reasonably be expected to improve the patient's condition.   COBOS, FERNANDO 9/21/201612:19 PM

## 2015-02-09 NOTE — B[facility] Group Notes (Signed)
[facility] LCSW Aftercare Discharge Planning Group Note   02/09/2015 11:07 AM  Participation Quality:  Engaged  Mood/Affect:  Flat  Depression Rating:  denies  Anxiety Rating:  5  Thoughts of Suicide:  No Will you contract for safety?   NA  Current AVH:  Yes  Plan for Discharge/Comments:  Recently discharge from [facility].  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  States his co-workers brought him in because he "was acting funny."  Admits he worries a lot bout his job "because it is stressful" and then his parents, with whom he is staying, are putting financial pressure on him as well.  Cites sister as support.    Transportation Means:   Supports:  Daryel Gerald B

## 2015-02-09 NOTE — Plan of Care (Signed)
Problem: Alteration in thought process Goal: LTG-Patient verbalizes understanding importance med regimen (Patient verbalizes understanding of importance of medication regimen and need to continue outpatient care.)  Outcome: Progressing Medication reviewed with the pt and verbalized under standing.

## 2015-02-09 NOTE — Progress Notes (Signed)
Patient ID: ABDULAH IQBAL, male   DOB: 11/14/1975, 39 y.o.   MRN: [id number]  D: Patient pleasant on approach tonight. Reports hearing voices and feeling anxious. PRN klonopin given per request a that time along with pain medication for back. Interacting well with peers on the unit. A: Staff will monitor on q 15 minute checks, follow treatment plan, and give medications as ordered. R: Cooperative on the unit and took medications without issue.
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
## 2015-02-10 DIAGNOSIS — F259 Schizoaffective disorder, unspecified: Principal | ICD-10-CM

## 2015-02-10 LAB — LIPID PANEL
CHOL/HDL RATIO: 5.3 ratio
Cholesterol: 245 mg/dL — ABNORMAL HIGH (ref 0–200)
HDL: 46 mg/dL (ref >40)
LDL Cholesterol: 158 mg/dL — ABNORMAL HIGH (ref 0–99)
Triglycerides: 205 mg/dL — ABNORMAL HIGH (ref <150)
VLDL: 41 mg/dL — AB (ref 0–40)

## 2015-02-10 LAB — TSH: TSH: 1.688 u[IU]/mL (ref 0.350–4.500)

## 2015-02-10 NOTE — [facility] Group Notes (Signed)
[facility] Group Notes:  (Nursing/MHT/Case Management/Adjunct)  Date:  02/10/2015  Time:  0930 Type of Therapy:  Nurse Education  Participation Level:  Active  Participation Quality:  Appropriate and Attentive  Affect:  Appropriate  Cognitive:  Alert and Appropriate  Insight:  Appropriate  Engagement in Group:  Engaged  Modes of Intervention:  Discussion, Education and Exploration  Summary of Progress/Problems: Group topic was on leisure and lifestyle changes.  Discussed positive leisure activities.  Why choosing and maintaining a good lifestyle is important and beneficial both physically and mentally.  Patient states goal is to "maintain a positive lifestyle and to get the help he needs."   Mickie Bail 02/10/2015, 1:22 PM

## 2015-02-10 NOTE — [facility] Group Notes (Signed)
[facility] LCSW Group Therapy  02/10/2015 1:43 PM  Type of Therapy: Process Group Therapy  Participation Level: Active  Participation Quality: Appropriate  Affect: Flat  Cognitive: Oriented  Insight: Improving  Engagement in Group: Engaged  Engagement in Therapy: Limited  Modes of Intervention: Activity, Clarification, Education, Problem-solving and Support  Summary of Progress/Problems: Today's group addressed the topic of hygiene.  Pt was alert and pleasant for the most part, though he did appear to drift off to sleep at one point.  Pt participated frequently and spoke about coping skills he uses to calm down and relax.  Used examples of "deep breathing exercises and meditation."  Larita Fife B. Beverely Pace, MSW Intern 02/10/2015 1:43 PM

## 2015-02-10 NOTE — Progress Notes (Signed)
DAR Note: Patient presents with depressed mood and affect.  Reports poor night sleep.  Rates depression at 3, hopelessness at 2, and anxiety at 5.  Denies auditory and visual hallucinations.  Complain about lower back pain.  Requested and received Oxy IR for pain.  Reports goal for today is to get out of here.  Medications given as prescribed.  Maintained on routine safety checks.  Support and encouragement offered as needed.   No adverse reaction noted.

## 2015-02-10 NOTE — Tx Team (Signed)
Interdisciplinary Treatment Plan Update (Adult)  Date:  02/10/2015 Time Reviewed:  2:59 PM  Progress in Treatment: Attending groups: Yes. Participating in groups: Yes. Taking medication as prescribed:  Yes. Tolerating medication:  Yes. Family/Significant othe contact made:  No, will contact:  Saralyn Pilar (father) [phone] Patient understands diagnosis:  Yes, as evidenced by seeking help with hearing voices. Discussing patient identified problems/goals with staff:  Yes, see initial care plan. Medical problems stabilized or resolved:  Yes Denies suicidal/homicidal ideation: Yes. Issues/concerns per patient self-inventory: No. Other:  Discharge Plan or Barriers: Pt is currently receiving services from someone that does not accept his insurance and is looking to switch providers.  Reason for Continuation of Hospitalization: Depression Hallucinations  Comments: 39 yo male, employed. States he presented to the hospital because his co workers were concerned about his lack of sleep/ severe insomnia . Patient reports severe insomnia,with multiple episodes of waking up during the night ( he has been diagnosed with Obstructive Sleep Apnea - uses CPAP). He describes auditory hallucinations, which are hypercritical- questioning his sexuality, calling him mentally retarded. He also often hears mumbling through walls . He reports occasionally seeing " shadows".He endorses some degree of depression, but emphasizes that his symptoms and decreased energy are mostly related to insomnia. He reports he had a prior psychiatric admission about one month ago at another hospital, for similar symptoms. His medications were adjusted and he reports he has been compliant. He reports he had initially felt better but about 1-2 weeks ago he started experiencing hallucinations again and hallucinations worsenedagain. Abilify, Cymbalta, Neurontin Trial  Estimated length of stay: 4-5 days  New goal(s):  Review of  initial/current patient goals per problem list:  1. Goal(s): Patient will participate in aftercare plan  Met:Yes  Target date: at discharge  As evidenced by: Patient will participate within aftercare plan AEB aftercare Sarahbeth Cashin and housing plan at discharge being identified.  Pt plans to return home, follow up outpt.  Goal met.   2. Goal (s): Patient will exhibit decreased depressive symptoms and suicidal ideations.  Met:Yes  Target date: at discharge  As evidenced by: Patient will utilize self rating of depression at 3 or below and demonstrate decreased signs of depression or be deemed stable for discharge by MD. 02/10/15  Pt rates his depression at a 3 today   3. Goal(s): Patient will demonstrate decreased signs and symptoms of anxiety.  Met:No  Target date: at discharge  As evidenced by: Patient will utilize self rating of anxiety at 3 or below and demonstrated decreased signs of anxiety, or be deemed stable for discharge by MD 02/10/15: Pt rates anxiety a 5 today.   4. Goal(s): Patient will demonstrate decreased signs of psychosis.  Met: No  Target date:at discharge  As evidenced by: Patient will demonstrate decreased signs of psychosis as evidenced by a reduction in AVH, paranoia, and/or delusions.   02/10/15: Pt endorses AH and reports that voices are constantly telling him negative things and making him paranoid.     Attendees: Patient:  02/10/2015 2:59 PM  Family:   02/10/2015 2:59 PM  Physician:  Neita Garnet, MD 02/10/2015 2:59 PM  Nursing:  Manuella Ghazi, RN 02/10/2015 2:59 PM  Case Manager:  Roque Lias, LCSW 02/10/2015 2:59 PM  Counselor:  Matthew Saras, MSW Intern 02/10/2015 2:59 PM  Other:   02/10/2015 2:59 PM  Other:   02/10/2015 2:59 PM  Other:   02/10/2015 2:59 PM  Other:  02/10/2015 2:59 PM  Other:  Other:    Other:    Other:    Other:    Other:      Scribe for Treatment Team:   Georga Kaufmann, MSW Intern 02/10/2015 2:59 PM

## 2015-02-10 NOTE — Progress Notes (Signed)
Matthew [name] attended a portion of Karaoke group and returned to unit

## 2015-02-10 NOTE — Progress Notes (Signed)
[facility] MD Progress Note  02/10/2015 6:39 PM Matthew [name]  MRN:  [id number] Subjective:   He reports he is feeling better, he denies any hallucinations today, and states his overall mood is better. At this time his focus is to be discharged soon, as he states " I do not think I need to be here any more, I am not suicidal or homicidal , and I feel better". Denies medication side effects. Objective : chart reviewed and patient seen- patient has remained calm , polite upon approach, and has been going to groups. As noted, denies medication side effects.  Insomnia is a chronic issue, but has worsened over recent weeks - patient reports a temporal correlation with Adderall  Dose increase to 30 mgrs BID and worsening insomnia. Hallucinations seem to have been related at least partially to sleep deprivation and stimulant medication as they have now resolved. Patient does not appear internally preoccupied and  Does not endorse any  symptoms of psychosis at this time. Principal Problem: Schizoaffective disorder Diagnosis:   Patient Active Problem List   Diagnosis Date Noted  . Schizoaffective disorder [F25.9] 02/09/2015  . Schizo-affective schizophrenia, chronic condition with acute exacerbation [F25.8] 02/08/2015  . Morbid obesity [E66.01] 01/18/2015  . OSA (obstructive sleep apnea) [G47.33] 07/14/2014  . Solitary pulmonary nodule [R91.1] 07/14/2014   Total Time spent with patient: 20 minutes   Past Medical History:  Past Medical History  Diagnosis Date  . Celiac disease   . Hypertension   . Pre-diabetes   . Dairy allergy   . High cholesterol   . Schizoaffective disorder   . Depression   . Anxiety   . Paranoia     Past Surgical History  Procedure Laterality Date  . Appendectomy    . Nasal septum surgery    . Wrist fracture surgery      right wrist  . Brain surgery     Family History:  Family History  Problem Relation Age of Onset  . Asthma Mother   . Hypertension Mother   .  Hypertension Father    Social History:  History  Alcohol Use No    Comment: no (02/08/15)     History  Drug Use No    Social History   Social History  . Marital Status: Single    Spouse Name: N/A  . Number of Children: 0  . Years of Education: N/A   Occupational History  . CT Tech    Social History Main Topics  . Smoking status: Current Every Day Smoker -- 0.25 packs/day    Types: Cigarettes  . Smokeless tobacco: None  . Alcohol Use: No     Comment: no (02/08/15)  . Drug Use: No  . Sexual Activity: Not Asked   Other Topics Concern  . None   Social History Narrative   Additional History:    Sleep:  States he slept better last night, but still woke up a few times  Appetite:  Good   Assessment:   Musculoskeletal: Strength & Muscle Tone: within normal limits Gait & Station: normal Patient leans: N/A   Psychiatric Specialty Exam: Physical Exam  ROS denies headache, denies chest pain, denies shortness of breath, denies vomiting   Blood pressure 114/75, pulse 78, temperature 97.7 F (36.5 C), temperature source Oral, resp. rate 18, height  (1.905 m), weight 264 lb (119.75 kg).Body mass index is 33 kg/(m^2).  General Appearance: improved grooming   Eye Contact::  Good  Speech:  Garbled  Volume:  Normal  Mood:  states he feels normal, denies depression  Affect:  Appropriate  Thought Process:  Linear  Orientation:  Full (Time, Place, and Person)  Thought Content:  denies hallucinations, no delusions, not internally preoccupied   Suicidal Thoughts:  No  Homicidal Thoughts:  No  Memory:  recent and remote grossly intact   Judgement:   Improving   Insight:  Present  Psychomotor Activity:  Normal  Concentration:  Good  Recall:  Good  Fund of Knowledge:Good  Language: Good  Akathisia:  Negative  Handed:  Right  AIMS (if indicated):     Assets:  Communication Skills Desire for Improvement Resilience Vocational/Educational  ADL's:  Intact   Cognition: WNL  Sleep:  Number of Hours: 4.75     Current Medications: Current Facility-Administered Medications  Medication Dose Route Frequency Provider Last Rate Last Dose  . acetaminophen (TYLENOL) tablet 650 mg  650 mg Oral Q6H PRN Kerry Hough, PA-C   650 mg at 02/09/15 1726  . alum & mag hydroxide-simeth (MAALOX/MYLANTA) 200-200-20 MG/5ML suspension 30 mL  30 mL Oral Q4H PRN Kerry Hough, PA-C      . ARIPiprazole (ABILIFY) tablet 5 mg  5 mg Oral Daily Craige Cotta, MD   5 mg at 02/10/15 0808  . atenolol (TENORMIN) tablet 100 mg  100 mg Oral q morning - 10a Kerry Hough, PA-C   100 mg at 02/10/15 1013  . clonazePAM (KLONOPIN) tablet 0.5 mg  0.5 mg Oral TID PRN Craige Cotta, MD   0.5 mg at 02/10/15 1709  . DULoxetine (CYMBALTA) DR capsule 60 mg  60 mg Oral BID Craige Cotta, MD   60 mg at 02/10/15 1709  . fluticasone (FLONASE) 50 MCG/ACT nasal spray 1 spray  1 spray Each Nare PRN Kerry Hough, PA-C      . gabapentin (NEURONTIN) capsule 300 mg  300 mg Oral TID Kerry Hough, PA-C   300 mg at 02/10/15 1709  . magnesium hydroxide (MILK OF MAGNESIA) suspension 30 mL  30 mL Oral Daily PRN Kerry Hough, PA-C      . nicotine polacrilex (NICORETTE) gum 2 mg  2 mg Oral PRN Jomarie Longs, MD   2 mg at 02/10/15 0809  . oxyCODONE (Oxy IR/ROXICODONE) immediate release tablet 5 mg  5 mg Oral Q8H PRN Craige Cotta, MD   5 mg at 02/10/15 1310  . pantoprazole (PROTONIX) EC tablet 20 mg  20 mg Oral Daily Craige Cotta, MD   20 mg at 02/10/15 1610  . tiZANidine (ZANAFLEX) tablet 4 mg  4 mg Oral TID PRN Kerry Hough, PA-C   4 mg at 02/10/15 1310  . traZODone (DESYREL) tablet 100 mg  100 mg Oral QHS PRN Craige Cotta, MD   100 mg at 02/09/15 2116    Lab Results:  Results for orders placed or performed during the hospital encounter of 02/08/15 (from the past 48 hour(s))  TSH     Status: None   Collection Time: 02/10/15  6:45 AM  Result Value Ref Range   TSH  1.688 0.350 - 4.500 uIU/mL    Comment: Performed at [facility]  Lipid panel     Status: Abnormal   Collection Time: 02/10/15  6:45 AM  Result Value Ref Range   Cholesterol 245 (H) 0 - 200 mg/dL   Triglycerides 960 (H) <150 mg/dL   HDL 46 >45 mg/dL   Total CHOL/HDL  Ratio 5.3 RATIO   VLDL 41 (H) 0 - 40 mg/dL   LDL Cholesterol 161 (H) 0 - 99 mg/dL    Comment:        Total Cholesterol/HDL:CHD Risk Coronary Heart Disease Risk Table                     Men   Women  1/2 Average Risk   3.4   3.3  Average Risk       5.0   4.4  2 X Average Risk   9.6   7.1  3 X Average Risk  23.4   11.0        Use the calculated Patient Ratio above and the CHD Risk Table to determine the patient's CHD Risk.        ATP III CLASSIFICATION (LDL):  <100     mg/dL   Optimal  096-045  mg/dL   Near or Above                    Optimal  130-159  mg/dL   Borderline  409-811  mg/dL   High  >914     mg/dL   Very High Performed at [facility]     Physical Findings: AIMS: Facial and Oral Movements Muscles of Facial Expression: None, normal Lips and Perioral Area: None, normal Jaw: None, normal Tongue: None, normal,Extremity Movements Upper (arms, wrists, hands, fingers): None, normal Lower (legs, knees, ankles, toes): None, normal, Trunk Movements Neck, shoulders, hips: None, normal, Overall Severity Severity of abnormal movements (highest score from questions above): None, normal Incapacitation due to abnormal movements: None, normal Patient's awareness of abnormal movements (rate only patient's report): No Awareness, Dental Status Current problems with teeth and/or dentures?: No Does patient usually wear dentures?: No  CIWA:  CIWA-Ar Total: 0 COWS:      Assessment - patient reports significant improvement - states hallucinations have resolved and does not appear internally preoccupied or psychotic at this time. Sleep fair, but improved compared to admission and it is felt  that Adderall, especially after recent titration to 30 mgrs BID, may have contributed to psychotic symptoms and worsened insomnia. At this time patient off Adderall , and thus far does not endorse any increased sedation or difficulties off this medication.   Treatment Plan Summary: Daily contact with patient to assess and evaluate symptoms and progress in treatment, Medication management, Plan inpatient admission and medications as below  Continue Trazodone 100 mgrs QHS PRN Insomnia Continue Neurontin 300 mgrs TID for pain and anxiety Continue Klonopin 0.5 mgrs TID PRN for Anxiety or agitation, as needed Continue Abilify 5 mgrs QDAY for psychotic symptoms' Continue Protonix for GERD  Continue to encourage group, milieu participation to work on mood , symptoms, coping skills. Treatment team working on disposition options  Medical Decision Making:  Established Problem, Stable/Improving (1), Review of Psycho-Social Stressors (1), Review or order clinical lab tests (1) and Review of Medication Regimen & Side Effects (2)     Matthew [name], Matthew [name] 02/10/2015, 6:39 PM

## 2015-02-11 DIAGNOSIS — F29 Unspecified psychosis not due to a substance or known physiological condition: Secondary | ICD-10-CM | POA: Insufficient documentation

## 2015-02-11 LAB — H PYLORI, IGM, IGG, IGA AB

## 2015-02-11 LAB — HEMOGLOBIN A1C
Hgb A1c MFr Bld: 6.4 % — ABNORMAL HIGH (ref 4.8–5.6)
Mean Plasma Glucose: 137 mg/dL

## 2015-02-11 LAB — PROLACTIN: Prolactin: 52.5 ng/mL — ABNORMAL HIGH (ref 4.0–15.2)

## 2015-02-11 MED ORDER — OXYCODONE HCL 5 MG PO TABS: 5.0000 mg | ORAL_TABLET | Freq: Three times a day (TID) | ORAL | Status: DC | PRN

## 2015-02-11 MED ORDER — GABAPENTIN 400 MG PO CAPS
400.0000 mg | ORAL_CAPSULE | Freq: Three times a day (TID) | ORAL | Status: DC
Start: 2015-02-11 — End: 2018-04-03

## 2015-02-11 MED ORDER — FLUTICASONE PROPIONATE 50 MCG/ACT NA SUSP: 1.0000 | NASAL | Status: DC | PRN

## 2015-02-11 MED ORDER — DULOXETINE HCL 60 MG PO CPEP
60.0000 mg | ORAL_CAPSULE | Freq: Two times a day (BID) | ORAL | Status: DC
Start: 2015-02-11 — End: 2018-03-10

## 2015-02-11 MED ORDER — TRAZODONE HCL 100 MG PO TABS: 100.0000 mg | ORAL_TABLET | Freq: Every evening | ORAL | Status: DC | PRN

## 2015-02-11 MED ORDER — ARIPIPRAZOLE 5 MG PO TABS: 5.0000 mg | ORAL_TABLET | Freq: Every day | ORAL | Status: DC

## 2015-02-11 MED ORDER — TIZANIDINE HCL 4 MG PO CAPS: 4.0000 mg | ORAL_CAPSULE | Freq: Three times a day (TID) | ORAL | Status: DC | PRN

## 2015-02-11 MED ORDER — NICOTINE POLACRILEX 2 MG MT GUM: 2.0000 mg | CHEWING_GUM | OROMUCOSAL | Status: DC | PRN

## 2015-02-11 MED ORDER — ATENOLOL 100 MG PO TABS: 100.0000 mg | ORAL_TABLET | Freq: Every morning | ORAL | Status: DC

## 2015-02-11 MED ORDER — CLONAZEPAM 0.5 MG PO TABS: 0.5000 mg | ORAL_TABLET | Freq: Three times a day (TID) | ORAL | Status: DC | PRN

## 2015-02-11 MED ORDER — GABAPENTIN 400 MG PO CAPS
400.0000 mg | ORAL_CAPSULE | Freq: Three times a day (TID) | ORAL | Status: DC
  Administered 2015-02-11: 400 mg via ORAL
  Filled 2015-02-11 (×7): qty 1

## 2015-02-11 NOTE — Discharge Summary (Signed)
Physician Discharge Summary Note  Patient:  Matthew [name] is an 39 y.o., male MRN:  [id number] DOB:  1975-11-30 Patient phone:  [phone] (home)  Patient address:   [address] [place] [address],  Total Time spent with patient: Greater than 30 minutes  Date of Admission:  02/08/2015  Date of Discharge: 02-11-15  Reason for Admission: Worsening symptoms of Schizoaffective disorder  Principal Problem: Schizoaffective disorder Discharge Diagnoses: Patient Active Problem List   Diagnosis Date Noted  . Psychoses [F29]   . Schizoaffective disorder [F25.9] 02/09/2015  . Schizo-affective schizophrenia, chronic condition with acute exacerbation [F25.8] 02/08/2015  . Morbid obesity [E66.01] 01/18/2015  . OSA (obstructive sleep apnea) [G47.33] 07/14/2014  . Solitary pulmonary nodule [R91.1] 07/14/2014   Musculoskeletal: Strength & Muscle Tone: within normal limits Gait & Station: normal Patient leans: N/A  Psychiatric Specialty Exam: Physical Exam  Psychiatric: His speech is normal and behavior is normal. Judgment and thought content normal. His mood appears not anxious. His affect is not angry, not blunt, not labile and not inappropriate. Cognition and memory are normal. He does not exhibit a depressed mood.    Review of Systems  Constitutional: Negative.   HENT: Negative.   Eyes: Negative.   Respiratory: Negative.   Cardiovascular: Negative.   Gastrointestinal: Negative.   Genitourinary: Negative.   Musculoskeletal: Negative.   Skin: Negative.   Endo/Heme/Allergies: Negative.   Psychiatric/Behavioral: Positive for depression (Stable) and hallucinations (Hx of). Negative for suicidal ideas, memory loss and substance abuse. The patient has insomnia (Stable). The patient is not nervous/anxious.     Blood pressure 100/70, pulse 74, temperature 98.9 F (37.2 C), temperature source Oral, resp. rate 18, height  (1.905 m), weight 119.75 kg (264 lb).Body mass index  is 33 kg/(m^2).  See Md's SRA   Have you used any form of tobacco in the last 30 days? (Cigarettes, Smokeless Tobacco, Cigars, and/or Pipes): Yes  Has this patient used any form of tobacco in the last 30 days? (Cigarettes, Smokeless Tobacco, Cigars, and/or Pipes) Yes, A prescription for an FDA-approved tobacco cessation medication was offered at discharge and the patient refused  Past Medical History:  Past Medical History  Diagnosis Date  . Celiac disease   . Hypertension   . Pre-diabetes   . Dairy allergy   . High cholesterol   . Schizoaffective disorder   . Depression   . Anxiety   . Paranoia     Past Surgical History  Procedure Laterality Date  . Appendectomy    . Nasal septum surgery    . Wrist fracture surgery      right wrist  . Brain surgery     Family History:  Family History  Problem Relation Age of Onset  . Asthma Mother   . Hypertension Mother   . Hypertension Father    Social History:  History  Alcohol Use No    Comment: no (02/08/15)     History  Drug Use No    Social History   Social History  . Marital Status: Single    Spouse Name: N/A  . Number of Children: 0  . Years of Education: N/A   Occupational History  . [employer]    Social History Main Topics  . Smoking status: Current Every Day Smoker -- 0.25 packs/day    Types: Cigarettes  . Smokeless tobacco: None  . Alcohol Use: No     Comment: no (02/08/15)  . Drug Use: No  . Sexual Activity:  Not Asked   Other Topics Concern  . None   Social History Narrative   Risk to Self: Is patient at risk for suicide?: No What has been your use of drugs/alcohol within the last 12 months?: Pt denies  Risk to Others: No Prior Inpatient Therapy: Yes Prior Outpatient Therapy: Yes  Level of Care:  OP  Hospital Course:  39 year old male, employed. States he presented to the hospital because his co workers were concerned about his lack of sleep/ severe insomnia . Patient reports severe insomnia,with  multiple episodes of waking up during the night ( he has been diagnosed with Obstructive Sleep Apnea - uses CPAP).He describes auditory hallucinations, which are hypercritical- questioning his sexuality, calling him mentally retarded. He also often hears mumbling through walls . He reports occasionally seeing " shadows ".He endorses some degree of depression, but emphasizes that his symptoms and decreased energy are mostly related to insomnia. He reports he had a prior psychiatric admission about one month ago at another hospital, for similar symptoms. His medications were adjusted and he reports he has been compliant.  Matthew [name] was admitted to the hospital with his UDS test reports showing positive Amphetamines. However, his reason for admission was worsening symptoms of Schizoaffective disorder requiring mood stabilization treatment. After evaluation of his symptoms, Matthew [name] was started on medication regimen for his presenting symptoms. His medication regimen included; Abilify5 mg daily for mood control, Duloxetine 60 mg daily for depression, Clonazepam 0.5 mg for anxiety & Trazodone 100 mg daily for insomnia. He was also enrolled & participated in the group counseling sessions being offered and held on this unit, he learned coping skills that should help him cope better and maintain mood stability after discharge. He presented other significant pre-existing health issues that required treatment and or monitoring. He was resumed on all his pertinent home medications for those medical issues, Matthew [name] tolerated his treatment regimen without any significant adverse effects and or reactions reported.  Matthew [name]'s symptoms were evaluated on daily basis by a clinical provider to ascertain his symptoms are responding to his treatment regimen & they were. This is evidenced by his reports of decreasing symptoms, improved mood, sleep, appetite and presentation of good affect. He is currently being discharged to  continue psychiatric treatment and medication management as noted below. He is provided with all the pertinent information required to make this appointment without problems.   On this day of his hospital discharge, Matthew [name] is in much improved condition than upon admission. He contracted for his safety and felt more in control of his mood. His symptoms were reported as significantly decreased or resolved completely. Maurilio denies any SIHI and voiced no AVH. He is instructed & motivated to continue taking medications with a goal of continued improvement in mental health. He was picked up by his father. He left [facility] in no apparent distress with all belongings.  Consults:  psychiatry  Significant Diagnostic Studies:  labs: CBC with diff, CMP, UDS, toxicology tests, U/A, results reviewed, stable  Discharge Vitals:   Blood pressure 100/70, pulse 74, temperature 98.9 F (37.2 C), temperature source Oral, resp. rate 18, height  (1.905 m), weight 119.75 kg (264 lb). Body mass index is 33 kg/(m^2). Lab Results:   Results for orders placed or performed during the hospital encounter of 02/08/15 (from the past 72 hour(s))  Hemoglobin A1c     Status: Abnormal   Collection Time: 02/10/15  6:45 AM  Result Value Ref Range   Hgb A1c MFr  Bld 6.4 (H) 4.8 - 5.6 %    Comment: (NOTE)         Pre-diabetes: 5.7 - 6.4         Diabetes: >6.4         Glycemic control for adults with diabetes: <7.0    Mean Plasma Glucose 137 mg/dL    Comment: (NOTE) Performed At: [facility] [address] [place], [place] [id number] Mila Homer MD WU:[id number] Performed at [facility]   TSH     Status: None   Collection Time: 02/10/15  6:45 AM  Result Value Ref Range   TSH 1.688 0.350 - 4.500 uIU/mL    Comment: Performed at [facility]  Lipid panel     Status: Abnormal   Collection Time: 02/10/15  6:45 AM  Result Value Ref Range   Cholesterol 245 (H) 0 - 200  mg/dL   Triglycerides 956 (H) <150 mg/dL   HDL 46 >21 mg/dL   Total CHOL/HDL Ratio 5.3 RATIO   VLDL 41 (H) 0 - 40 mg/dL   LDL Cholesterol 308 (H) 0 - 99 mg/dL    Comment:        Total Cholesterol/HDL:CHD Risk Coronary Heart Disease Risk Table                     Men   Women  1/2 Average Risk   3.4   3.3  Average Risk       5.0   4.4  2 X Average Risk   9.6   7.1  3 X Average Risk  23.4   11.0        Use the calculated Patient Ratio above and the CHD Risk Table to determine the patient's CHD Risk.        ATP III CLASSIFICATION (LDL):  <100     mg/dL   Optimal  657-846  mg/dL   Near or Above                    Optimal  130-159  mg/dL   Borderline  962-952  mg/dL   High  >841     mg/dL   Very High Performed at [facility]   Prolactin     Status: Abnormal   Collection Time: 02/10/15  6:45 AM  Result Value Ref Range   Prolactin 52.5 (H) 4.0 - 15.2 ng/mL    Comment: (NOTE) Performed At: [facility] [address] [place], [place] [id number] Mila Homer MD OZ:[id number] Performed at [facility]     Physical Findings: AIMS: Facial and Oral Movements Muscles of Facial Expression: None, normal Lips and Perioral Area: None, normal Jaw: None, normal Tongue: None, normal,Extremity Movements Upper (arms, wrists, hands, fingers): None, normal Lower (legs, knees, ankles, toes): None, normal, Trunk Movements Neck, shoulders, hips: None, normal, Overall Severity Severity of abnormal movements (highest score from questions above): None, normal Incapacitation due to abnormal movements: None, normal Patient's awareness of abnormal movements (rate only patient's report): No Awareness, Dental Status Current problems with teeth and/or dentures?: No Does patient usually wear dentures?: No  CIWA:  CIWA-Ar Total: 0 COWS:     See Psychiatric Specialty Exam and Suicide Risk Assessment completed by Attending Physician prior to  discharge.  Discharge destination:  Home  Is patient on multiple antipsychotic therapies at discharge:  No   Has Patient had three or more failed trials of antipsychotic monotherapy by history:  No  Recommended Plan for Multiple Antipsychotic Therapies: NA    Medication List    STOP taking these medications        amphetamine-dextroamphetamine 30 MG tablet  Commonly known as:  ADDERALL     FISH OIL PO     mirtazapine 45 MG tablet  Commonly known as:  REMERON     OLANZapine 15 MG tablet  Commonly known as:  ZYPREXA     oxyCODONE-acetaminophen 10-325 MG per tablet  Commonly known as:  PERCOCET     pseudoephedrine 60 MG tablet  Commonly known as:  SUDAFED      TAKE these medications      Indication   ARIPiprazole 5 MG tablet  Commonly known as:  ABILIFY  Take 1 tablet (5 mg total) by mouth daily. For mood control   Indication:  Mood control     atenolol 100 MG tablet  Commonly known as:  TENORMIN  Take 1 tablet (100 mg total) by mouth every morning. For high blood pressure   Indication:  High Blood Pressure     clonazePAM 0.5 MG tablet  Commonly known as:  KLONOPIN  Take 1 tablet (0.5 mg total) by mouth 3 (three) times daily as needed (anxiety).   Indication:  Anxiety     DULoxetine 60 MG capsule  Commonly known as:  CYMBALTA  Take 1 capsule (60 mg total) by mouth 2 (two) times daily. For depression   Indication:  Major Depressive Disorder     fluticasone 50 MCG/ACT nasal spray  Commonly known as:  FLONASE  Place 1 spray into both nostrils as needed for allergies or rhinitis.   Indication:  Perennial Rhinitis, Hayfever     gabapentin 400 MG capsule  Commonly known as:  NEURONTIN  Take 1 capsule (400 mg total) by mouth 3 (three) times daily. For agitation   Indication:  Agitation     nicotine polacrilex 2 MG gum  Commonly known as:  NICORETTE  Take 1 each (2 mg total) by mouth as needed for smoking cessation.   Indication:  Nicotine Addiction      oxyCODONE 5 MG immediate release tablet  Commonly known as:  Oxy IR/ROXICODONE  Take 1 tablet (5 mg total) by mouth every 8 (eight) hours as needed for moderate pain or severe pain.   Indication:  Chronic Pain     tiZANidine 4 MG capsule  Commonly known as:  ZANAFLEX  Take 1 capsule (4 mg total) by mouth 3 (three) times daily as needed (headache).   Indication:  Muscle Spasticity, Tension Headache     traZODone 100 MG tablet  Commonly known as:  DESYREL  Take 1 tablet (100 mg total) by mouth at bedtime as needed for sleep.   Indication:  Trouble Sleeping       Follow-up Information    Follow up with [facility] On 02/16/2015.   Why:  Wednesday at 8:45 with [place] [facility].  Group is from 9-12 M-F. IOP is on the ground floor.  You can access it by the outside stairs on the south side of the building   Contact information:   [address]  [facility] [phone]     Follow-up recommendations:  Activity:  As tolerated Diet: As recommended by your primary care doctor. Keep all scheduled follow-up appointments as recommended.   Comments: Take all your medications as prescribed by your mental healthcare provider. Report any adverse effects and or reactions from your medicines to your outpatient provider  promptly. Patient is instructed and cautioned to not engage in alcohol and or illegal drug use while on prescription medicines. In the event of worsening symptoms, patient is instructed to call the crisis hotline, 911 and or go to the nearest ED for appropriate evaluation and treatment of symptoms. Follow-up with your primary care provider for your other medical issues, concerns and or health care needs.   Total Discharge Time: Greater than 30 minutes  Signed: Sanjuana Kava, PMHNP, FNP-BC 02/11/2015, 1:42 PM   Patient seen, Suicide Assessment Completed.  Disposition Plan Reviewed

## 2015-02-11 NOTE — Progress Notes (Signed)
DAR Note: Patient remain sad and depressed.  Rates depression at 2/10, hopelessness at 1/10, and anxiety at 4/10.  Complain of back pain. Denies auditory and visual hallucinations.  Patient requested and received klonopin for complain of anxiety.  Patient educated about using his coping and relaxation skills.  Medication given as prescribed.  Maintained on routine safety checks.  Attended group therapy.  Support and encouragement offered as needed.  States goal is "laughing, smiling,and being happy."

## 2015-02-11 NOTE — [facility] Group Notes (Signed)
[facility] LCSW Aftercare Discharge Planning Group Note   02/11/2015 10:20 AM  Participation Quality:  Acive  Mood/Affect:  Appropriate  Depression Rating:  Pt denies   Anxiety Rating:  Pt denies   Thoughts of Suicide:  No Will you contract for safety?   NA  Current AVH:  Yes  Plan for Discharge/Comments:  Pt reports that he is feeling better and is considering returning to work soon after discharge. Pt reports that his paranoia is much better but is "unsure if it will ever be completely gone". Pt is agreeable to following up with a service provider that accepts his insurance.   Transportation Means: Family will transport   Supports: Family and mental health providers   Matthew [name]

## 2015-02-11 NOTE — Tx Team (Signed)
Interdisciplinary Treatment Plan Update (Adult)  Date:  02/11/2015 Time Reviewed:  11:58 AM  Progress in Treatment: Attending groups: Yes. Participating in groups: Yes. Taking medication as prescribed:  Yes. Tolerating medication:  Yes. Family/Significant othe contact made:  Yes Patient understands diagnosis:  Yes, as evidenced by seeking help with hearing voices. Discussing patient identified problems/goals with staff:  Yes, see initial care plan. Medical problems stabilized or resolved:  Yes Denies suicidal/homicidal ideation: Yes. Issues/concerns per patient self-inventory: No. Other:  Discharge Plan or Barriers: Return home, follow up [facility]  Reason for Continuation of Hospitalization:   Comments: 39 yo male, employed. States he presented to the hospital because his co workers were concerned about his lack of sleep/ severe insomnia . Patient reports severe insomnia,with multiple episodes of waking up during the night ( he has been diagnosed with Obstructive Sleep Apnea - uses CPAP). He describes auditory hallucinations, which are hypercritical- questioning his sexuality, calling him mentally retarded. He also often hears mumbling through walls . He reports occasionally seeing " shadows".He endorses some degree of depression, but emphasizes that his symptoms and decreased energy are mostly related to insomnia. He reports he had a prior psychiatric admission about one month ago at another hospital, for similar symptoms. His medications were adjusted and he reports he has been compliant. He reports he had initially felt better but about 1-2 weeks ago he started experiencing hallucinations again and hallucinations worsenedagain. Abilify, Cymbalta, Neurontin Trial  Estimated length of stay: 4-5 days  New goal(s):  Review of initial/current patient goals per problem list:  1. Goal(s): Patient will participate in aftercare plan  Met:Yes  Target date: at discharge  As  evidenced by: Patient will participate within aftercare plan AEB aftercare provider and housing plan at discharge being identified.  Pt plans to return home, follow up outpt.  Goal met.   2. Goal (s): Patient will exhibit decreased depressive symptoms and suicidal ideations.  Met:Yes  Target date: at discharge  As evidenced by: Patient will utilize self rating of depression at 3 or below and demonstrate decreased signs of depression or be deemed stable for discharge by MD. 02/10/15  Pt rates his depression at a 3 today   3. Goal(s): Patient will demonstrate decreased signs and symptoms of anxiety.  Met:Yes  Target date: at discharge  As evidenced by: Patient will utilize self rating of anxiety at 3 or below and demonstrated decreased signs of anxiety, or be deemed stable for discharge by MD 02/10/15: Pt rates anxiety a 5 today.   02/11/2015 Pt rates anxiety a 3 today  4. Goal(s): Patient will demonstrate decreased signs of psychosis.  Met: Yes  Target date:at discharge  As evidenced by: Patient will demonstrate decreased signs of psychosis as evidenced by a reduction in AVH, paranoia, and/or delusions.   02/10/15: Pt endorses AH and reports that voices are constantly telling him negative things and making him paranoid. 02/11/2015 Pt denies AH; claims decreased paranoia     Attendees: Patient:  02/11/2015 11:58 AM  Family:   02/11/2015 11:58 AM  Physician:  Neita Garnet, MD 02/11/2015 11:58 AM  Nursing:  Manuella Ghazi, RN 02/11/2015 11:58 AM  Case Manager:  Roque Lias, LCSW 02/11/2015 11:58 AM  Counselor:  Matthew Saras, MSW Intern 02/11/2015 11:58 AM  Other:   02/11/2015 11:58 AM  Other:   02/11/2015 11:58 AM  Other:   02/11/2015 11:58 AM  Other:  02/11/2015 11:58 AM  Other:    Other:    Other:  Other:    Other:    Other:      Scribe for Treatment Team:   Georga Kaufmann, MSW Intern 02/11/2015 11:58 AM

## 2015-02-11 NOTE — Progress Notes (Signed)
D   Pt is depressed and sad  And reports hearing voices   He is very focused on his pain medications and came back from karaoke group because it was nearing time that he could receive another dose    Pt tends to isolate to his room and has minimal interaction with others but does talk to his roommate A   Verbal support given  Medications administered and effectiveness monitored   Encouraged group participation R   Pt safe at present

## 2015-02-11 NOTE — [facility] Suicide Risk Assessment (Addendum)
[facility] Discharge Suicide Risk Assessment   Demographic Factors:   39 year old  Male, lives with parents, employed   Total Time spent with patient: 30 minutes  Musculoskeletal: Strength & Muscle Tone: within normal limits Gait & Station: normal Patient leans: N/A  Psychiatric Specialty Exam: Physical Exam  ROS  Blood pressure 100/70, pulse 74, temperature 98.9 F (37.2 C), temperature source Oral, resp. rate 18, height  (1.905 m), weight 264 lb (119.75 kg).Body mass index is 33 kg/(m^2).  General Appearance: improved grooming   Eye Contact::  Good  Speech:  Normal Rate409  Volume:  Normal  Mood:  denies depression , states mood is "OK"  Affect:  Appropriate, more reactive   Thought Process:  Linear  Orientation:  Full (Time, Place, and Person)  Thought Content:  no hallucinations today, states these have completely resolved , not internally preoccupied, no delusions   Suicidal Thoughts:  No- denies any thoughts of hurting self or of SI  Homicidal Thoughts:  No denies any thoughts of hurting anyone else   Memory:  recent and remote grossly intact   Judgement:  Improving   Insight:  improving  Psychomotor Activity:  Normal  Concentration:  Good  Recall:  Good  Fund of Knowledge:Good  Language: Fair  Akathisia:  Negative  Handed:  Right  AIMS (if indicated):     Assets:  Desire for Improvement Housing Social Support Vocational/Educational  Sleep:  Number of Hours: 5.75  Cognition: WNL  ADL's:   Improved    Have you used any form of tobacco in the last 30 days? (Cigarettes, Smokeless Tobacco, Cigars, and/or Pipes): Yes  Has this patient used any form of tobacco in the last 30 days? (Cigarettes, Smokeless Tobacco, Cigars, and/or Pipes) Yes, A prescription for an FDA-approved tobacco cessation medication was offered at discharge and the patient refused  Mental Status Per Nursing Assessment::   On Admission:     Current Mental Status by Physician: Patient is alert,  attentive today, 0x3. He reports much improvement compared to admission presentation. Denies depression, and affect does seem more reactive , no thought disorder, no SI, no HI, no psychotic symptoms- does not appear psychotic or internally preoccupied, future oriented.   Loss Factors:  psychosocial stressors   Historical Factors:  one prior psychiatric admission for similar presentation a few weeks prior. Auditory hallucinations over recent months, history of depression. History of Anxiety.  Risk Reduction Factors:   Sense of responsibility to family, Living with another person, especially a relative, Positive social support and Positive coping skills or problem solving skills  Continued Clinical Symptoms:  Patient requesting discharge today- states he is feeling much better, and wants to return home " to sleep in my own bed again".  As noted, at this time patient much improved compared to admission and no longer psychotic. Describing improved mood - no SI or HI, future oriented  Sleeping improved .  * Of note, he states Neurontin well tolerated and partially but significantly improving his anxiety and pain issues , so that he has needed to take less opiate medication. We have increased dose to 400 mgrs TID * Of note, with patient's express consent and in his presence I spoke via phone with patient's father, with whom he lives and who visited him yesterday- father corroborates patient is improved and is in agreement with patient being discharged today.  Cognitive Features That Contribute To Risk:  No gross cognitive deficits noted upon discharge. Is alert , attentive, and  oriented x 3   Suicide Risk:  Mild:  Suicidal ideation of limited frequency, intensity, duration, and specificity.  There are no identifiable plans, no associated intent, mild dysphoria and related symptoms, good self-control (both objective and subjective assessment), few other risk factors, and identifiable protective  factors, including available and accessible social support.  Principal Problem: Schizoaffective disorder Discharge Diagnoses:  Patient Active Problem List   Diagnosis Date Noted  . Schizoaffective disorder [F25.9] 02/09/2015  . Schizo-affective schizophrenia, chronic condition with acute exacerbation [F25.8] 02/08/2015  . Morbid obesity [E66.01] 01/18/2015  . OSA (obstructive sleep apnea) [G47.33] 07/14/2014  . Solitary pulmonary nodule [R91.1] 07/14/2014      Plan Of Care/Follow-up recommendations:  Activity:  as tolerated  Diet:  heart healthy Tests:  NA Other:  see below   Is patient on multiple antipsychotic therapies at discharge:  No   Has Patient had three or more failed trials of antipsychotic monotherapy by history:  No  Recommended Plan for Multiple Antipsychotic Therapies: NA  Patient is requesting discharge and there are no current grounds for involuntary commitment .  He plans to return home . He is interested in IOP partiipation after discharge . Have reviewed medication issues , encouraged him to avoid/ minimize use of stimulants such as Sudafed and Adderall which appear to have been contributing to his insomnia and possibly to his psychotic symptoms.  Encouraged to follow up with his PCP regarding medical issues, monitoring of HgbA1C and Lipid levels   COBOS, FERNANDO 02/11/2015, 11:48 AM

## 2015-02-11 NOTE — [facility] Suicide Risk Assessment (Signed)
[facility] INPATIENT:  Family/Significant Other Suicide Prevention Education  Suicide Prevention Education:  Contact Attempts: Matthew [name] (father) [phone] has been identified by the patient as the family member/significant other with whom the patient will be residing, and identified as the person(s) who will aid the patient in the event of a mental health crisis.  With written consent from the patient, two attempts were made to provide suicide prevention education, prior to and/or following the patient's discharge.  We were unsuccessful in providing suicide prevention education.  A suicide education pamphlet was given to the patient to share with family/significant other.  Date and time of first attempt: 02/11/15 No answer, left a message at 11:30am.   Matthew [name] 02/11/2015, 11:39 AM

## 2015-02-11 NOTE — Progress Notes (Signed)
Pt discharged home with prescriptions. Patient was stable and appreciative at that time. Discharge instruction and prescriptions reviewed with patient.  Maintained on routine safety checks until discharged.  All papers and prescriptions were given and valuables returned. Verbal understanding expressed. Denies SI/HI and A/VH. Patient given opportunity to express concerns and ask questions.

## 2015-02-11 NOTE — Progress Notes (Signed)
  [facility] Adult Case Management Discharge Plan :  Will you be returning to the same living situation after discharge:  Yes,  home At discharge, do you have transportation home?: Yes,  father Do you have the ability to pay for your medications: Yes,  insurance  Release of information consent forms completed and in the chart;  Patient's signature needed at discharge.  Patient to Follow up at: Follow-up Information    Follow up with [facility] On 02/16/2015.   Why:  Wednesday at 8:45 with [facility].  Group is from 9-12 M-F. IOP is on the ground floor.  You can access it by the outside stairs on the south side of the building   Contact information:   [address]  Ginette Otto [[phone]] [phone]      Patient denies SI/HI: Yes,  yes    Safety Planning and Suicide Prevention discussed: Yes,  yes  Have you used any form of tobacco in the last 30 days? (Cigarettes, Smokeless Tobacco, Cigars, and/or Pipes): Yes  Has patient been referred to the Quitline?: Yes, faxed on 02/11/15  Ida Rogue 02/11/2015, 12:01 PM

## 2015-02-21 ENCOUNTER — Other Ambulatory Visit: Payer: 59

## 2015-03-23 ENCOUNTER — Ambulatory Visit: Payer: 59 | Admitting: Pulmonary Disease

## 2018-03-10 ENCOUNTER — Encounter: Payer: Self-pay | Admitting: Psychiatry

## 2018-03-10 ENCOUNTER — Ambulatory Visit (INDEPENDENT_AMBULATORY_CARE_PROVIDER_SITE_OTHER): Payer: PRIVATE HEALTH INSURANCE | Admitting: Psychiatry

## 2018-03-10 VITALS — BP 124/90 | HR 68 | Ht 74.0 in | Wt 270.0 lb

## 2018-03-10 DIAGNOSIS — G4733 Obstructive sleep apnea (adult) (pediatric): Secondary | ICD-10-CM

## 2018-03-10 DIAGNOSIS — F411 Generalized anxiety disorder: Secondary | ICD-10-CM | POA: Diagnosis not present

## 2018-03-10 DIAGNOSIS — F902 Attention-deficit hyperactivity disorder, combined type: Secondary | ICD-10-CM | POA: Diagnosis not present

## 2018-03-10 DIAGNOSIS — F250 Schizoaffective disorder, bipolar type: Secondary | ICD-10-CM

## 2018-03-10 MED ORDER — PREGABALIN 100 MG PO CAPS: 100.0000 mg | ORAL_CAPSULE | Freq: Three times a day (TID) | ORAL | 3 refills | Status: DC

## 2018-03-10 MED ORDER — CLONAZEPAM 1 MG PO TABS: 1.0000 mg | ORAL_TABLET | Freq: Three times a day (TID) | ORAL | 2 refills | Status: DC | PRN

## 2018-03-10 MED ORDER — AMPHETAMINE-DEXTROAMPHETAMINE 30 MG PO TABS: 1.0000 | ORAL_TABLET | Freq: Two times a day (BID) | ORAL | 0 refills | Status: DC

## 2018-03-10 MED ORDER — BREXPIPRAZOLE 2 MG PO TABS: 4.0000 mg | ORAL_TABLET | Freq: Every morning | ORAL | 0 refills | Status: DC

## 2018-03-10 MED ORDER — ATENOLOL 100 MG PO TABS: 100.0000 mg | ORAL_TABLET | Freq: Every morning | ORAL | 3 refills | Status: DC

## 2018-03-10 MED ORDER — BREXPIPRAZOLE 1 MG PO TABS: 4.0000 mg | ORAL_TABLET | Freq: Every morning | ORAL | 0 refills | Status: DC

## 2018-03-10 MED ORDER — AMPHETAMINE-DEXTROAMPHETAMINE 30 MG PO TABS: 30.0000 mg | ORAL_TABLET | Freq: Two times a day (BID) | ORAL | 0 refills | Status: DC

## 2018-03-10 MED ORDER — DULOXETINE HCL 60 MG PO CPEP: 60.0000 mg | ORAL_CAPSULE | Freq: Two times a day (BID) | ORAL | 3 refills | Status: DC

## 2018-03-10 MED ORDER — BREXPIPRAZOLE 3 MG PO TABS: 4.0000 mg | ORAL_TABLET | Freq: Every morning | ORAL | 0 refills | Status: DC

## 2018-03-10 NOTE — Progress Notes (Signed)
[facility]  Patient ID: Matthew [name],  MRN: [id number]  PCP: Matthew Dials, MD  Date of Evaluation: 03/10/2018 Time spent:25 minutes   HISTORY/CURRENT STATUS: HPI By office protocol, prior medications are known to have been Zyprexa, Abilify, Latuda, Strattera, trazodone, Topamax, lithium  Individual Medical History/ Review of Systems: Changes? :Yes .  Matthew [name] is seen conjointly with both parents face-to-face with consent without collateral 1 month early for expected follow-up for psychiatric interview and exam in 53-monthevaluation and management of schizoaffective bipolar predominantly depressed, generalized anxiety, and ADHD.  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  He has been fired wondering if peers or administration may have plotted against him.  Just that he did complete his necessary continuing education work for his certification.  Parent suggests this is his second job loss of significance as I also reviewed that [place].  Parents reiterate that the patient's dog is now dead and parents cannot continue to support him apparently getting him a new car in the interim.  Parents seeks government housing noting that community therapist Matthew [name] their church has suggested the patient needs disability and such housing.  Parents maintain the patient is on the fence being smart enough and capable enough to work but doing little to support himself outside of the job.  He denies interim health concerns or substance use except parents seem to imply some alcohol.  Parents come here again expecting that social work services can be provided along with disability and housing.  However patient can clarified to them that the [place] possible to [place] as a family [facility]  The patient emphasizes that he [facility] [place]  He suggested his medications have been best ever for his ability to have some quality and pleasure of life even if his occupational ability is marginal as evident in being fired from his job.  Parents thereby want the current care with all the other needs to be met which I clarify cannot be possible at this time.  Allergies: Gluten meal  Current Medications:  Current Outpatient Medications:  .  pregabalin (LYRICA) 100 MG capsule, Take 1 capsule (100 mg total) by mouth 3 (three) times daily., Disp: 90 capsule, Rfl: 3 .  [name] 4 MG TABS, Take 1 tablet by mouth every morning., Disp: , Rfl: 1 .  amphetamine-dextroamphetamine (ADDERALL) 30 MG tablet, Take 1 tablet by mouth 2 (two) times daily., Disp: 60 tablet, Rfl: 0 .  [START ON 04/09/2018] amphetamine-dextroamphetamine (ADDERALL) 30 MG tablet, Take 1 tablet by mouth 2 (two) times daily., Disp: 60 tablet, Rfl: 0 .  [START ON 05/09/2018] amphetamine-dextroamphetamine (ADDERALL) 30 MG tablet, Take 1 tablet by mouth 2 (two) times daily., Disp: 60 tablet, Rfl: 0 .  atenolol (TENORMIN) 100 MG tablet, Take 1 tablet (100 mg total) by mouth every morning. For high blood pressure, Disp: 30 tablet, Rfl: 3 .  Brexpiprazole ([name]) 1 MG TABS, Take 4 tablets (4 mg total) by mouth every morning., Disp: 14 tablet, Rfl: 0 .  Brexpiprazole ([name]) 2 MG TABS, Take 4 mg by mouth every morning., Disp: 28 tablet, Rfl: 0 .  Brexpiprazole ([name]) 3 MG TABS, Take 4 mg  by mouth every morning., Disp: 14 tablet, Rfl: 0 .  clonazePAM (KLONOPIN) 0.5 MG tablet, Take 1 tablet (0.5 mg total) by mouth 3 (three) times daily as needed (anxiety)., Disp: 15 tablet, Rfl: 0 .  clonazePAM (KLONOPIN) 1 MG tablet, Take 1 tablet (1 mg total) by mouth 3 (three) times daily as needed for anxiety., Disp: 90  tablet, Rfl: 2 .  DULoxetine (CYMBALTA) 60 MG capsule, Take 1 capsule (60 mg total) by mouth 2 (two) times daily. For depression, Disp: 60 capsule, Rfl: 3 .  fluticasone (FLONASE) 50 MCG/ACT nasal spray, Place 1 spray into both nostrils as needed for allergies or rhinitis., Disp: , Rfl: 2 .  gabapentin (NEURONTIN) 400 MG capsule, Take 1 capsule (400 mg total) by mouth 3 (three) times daily. For agitation, Disp: 90 capsule, Rfl: 0 .  nicotine polacrilex (NICORETTE) 2 MG gum, Take 1 each (2 mg total) by mouth as needed for smoking cessation., Disp: 100 tablet, Rfl: 0 .  oxyCODONE (OXY IR/ROXICODONE) 5 MG immediate release tablet, Take 1 tablet (5 mg total) by mouth every 8 (eight) hours as needed for moderate pain or severe pain., Disp: 6 tablet, Rfl: 0 .  tiZANidine (ZANAFLEX) 4 MG capsule, Take 1 capsule (4 mg total) by mouth 3 (three) times daily as needed (headache)., Disp: 20 capsule, Rfl: 0 .  traZODone (DESYREL) 100 MG tablet, Take 1 tablet (100 mg total) by mouth at bedtime as needed for sleep., Disp: 30 tablet, Rfl: 0   Medication Side Effects: Other: Whether negative psychotic symptoms more medication associated apathy and loss of spontaneity, patient is fixated in the session waiting for parents to provide  Family Medical/ Social History: Changes? Yes parents are retired needing to downsize unable to continue physically and economically supporting the patient.  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  General Appearance: Bizarre, Fairly Groomed and Guarded  Eye Contact:  Fair  Speech:  Blocked  Volume:  Normal  Mood:  Anxious, Dysphoric, Irritable and Worthless  Affect:  Constricted, Flat and Inappropriate   Thought Process:  Irrelevant  Orientation:  Full (Time, Place, and Person)  Thought Content: Obsessions, Paranoid Ideation and Rumination   Suicidal Thoughts:  No  Homicidal Thoughts:  No  Memory:  Remote  Judgement:  Impaired  Insight:  Lacking  Psychomotor Activity:  Decreased and Mannerisms  Concentration:  Concentration: Fair and Attention Span: Fair  Recall:  [employer] of Knowledge: Fair  Language: Fair  Akathisia:  No  AIMS (if indicated): done = 0 muscle strength/5 and postural reflexes 0/0  Assets:  Transportation  ADL's:  Impaired  Cognition: WNL  Prognosis:  Poor    DIAGNOSES:    ICD-10-CM   1. Schizoaffective disorder, bipolar type (HCC) F25.0 Brexpiprazole ([name]) 3 MG TABS    Brexpiprazole ([name]) 2 MG TABS    Brexpiprazole ([name]) 1 MG TABS    DULoxetine (CYMBALTA) 60 MG capsule  2. Attention deficit hyperactivity disorder (ADHD), combined type, moderate F90.2 amphetamine-dextroamphetamine (ADDERALL) 30 MG tablet    amphetamine-dextroamphetamine (ADDERALL) 30 MG tablet    amphetamine-dextroamphetamine (ADDERALL) 30 MG tablet    DULoxetine (CYMBALTA) 60 MG capsule  3. Generalized anxiety disorder F41.1 clonazePAM (KLONOPIN) 1 MG tablet  atenolol (TENORMIN) 100 MG tablet    DULoxetine (CYMBALTA) 60 MG capsule    pregabalin (LYRICA) 100 MG capsule  4. OSA (obstructive sleep apnea) G47.33     RECOMMENDATIONS: Extensive answering repetitively of every question including as addressed last appointment when parents attended establishes his relative destitute status needing multiple as of support and resources in the community.  I explain repeatedly stability determination services in [place] and option of legal support toward such, housing and community services through [place] and/or [place], medication resources including psychiatric services through [place].  Provide [name] 4 mg every morning samples for 1 month and can  send a replacement scription such as for [name] to his pharmacy if he runs low on [name] and does not avail himself of the services as above.  He is provided prescriptions at his requirement out of parents for the Adderall 30 mg IR twice daily for October, November, and December other agreeing he needs this though father thinks all medications are unnecessary.  Mother notes he has taken Adderall since his childhood.  Klonopin 1 mg 3 times daily month supply and 2 refills is also written as [facility] other [facility].  [facility] is sent his atenolol 100 mg every morning, duloxetine 60 mg taking 2 every morning, and Lyrica 100 mg 3 times daily.  He is offered an appointment in 3 months though suggesting that accessing these other services is more appropriate as they do not expect he will obtain work.    Delight Hoh, MD

## 2018-03-12 ENCOUNTER — Other Ambulatory Visit: Payer: Self-pay | Admitting: Psychiatry

## 2018-03-12 NOTE — Telephone Encounter (Signed)
Verifying medication with provider

## 2018-03-13 ENCOUNTER — Telehealth: Payer: Self-pay | Admitting: Psychiatry

## 2018-03-13 DIAGNOSIS — F411 Generalized anxiety disorder: Secondary | ICD-10-CM

## 2018-03-13 DIAGNOSIS — F250 Schizoaffective disorder, bipolar type: Secondary | ICD-10-CM

## 2018-03-13 DIAGNOSIS — F902 Attention-deficit hyperactivity disorder, combined type: Secondary | ICD-10-CM

## 2018-03-13 MED ORDER — DULOXETINE HCL 60 MG PO CPEP: 60.0000 mg | ORAL_CAPSULE | Freq: Two times a day (BID) | ORAL | 0 refills | Status: DC

## 2018-03-13 NOTE — Telephone Encounter (Signed)
Patient phones after our appointment with both parents followed by disability determination appointment made for November 18 seeking Medicaid and housing and assuring all medications.  The patient is out of the Loxitane except for doses remaining.  His refill sent to [facility] is being delayed in filling by pharmacy at [facility] until middle of next month.  He requested an interim supply to [facility] until his established cycle of refills can be caught up.  1 month supply is therefore sent to [facility] for duloxetine 60 mg twice daily as he request lithium for his current depressed mood by decline as he has not seen primary care for the copy of the first-degree heart block by EKG I provided in copy along with elevated prolactin and A1c taking atenolol for blood pressure out medical monitoring any longer so that he is not currently a good candidate for lithium unless simply by augmentation of 600 mg if duloxetine restoration is not sufficient.

## 2018-03-26 ENCOUNTER — Ambulatory Visit: Payer: 59 | Admitting: Psychiatry

## 2018-04-03 ENCOUNTER — Telehealth: Payer: Self-pay | Admitting: Psychiatry

## 2018-04-03 DIAGNOSIS — F411 Generalized anxiety disorder: Secondary | ICD-10-CM

## 2018-04-03 MED ORDER — GABAPENTIN 400 MG PO CAPS: 400.0000 mg | ORAL_CAPSULE | Freq: Three times a day (TID) | ORAL | 1 refills | Status: DC

## 2018-04-03 NOTE — Telephone Encounter (Signed)
Patient phones now out of work that his pregabalin 100 mg 3 times daily is too expensive for his generalized/social anxiety, agitation, and chronic pain.  He wishes to resume the previous gabapentin 400 mg 3 times daily #90 with [facility] [address]

## 2018-04-08 ENCOUNTER — Other Ambulatory Visit: Payer: Self-pay

## 2018-04-08 DIAGNOSIS — F411 Generalized anxiety disorder: Secondary | ICD-10-CM

## 2018-04-08 MED ORDER — PREGABALIN 100 MG PO CAPS: 100.0000 mg | ORAL_CAPSULE | Freq: Three times a day (TID) | ORAL | 1 refills | Status: DC

## 2018-04-08 MED ORDER — PREGABALIN 100 MG PO CAPS: ORAL_CAPSULE | ORAL | 1 refills | Status: DC

## 2018-05-06 ENCOUNTER — Other Ambulatory Visit: Payer: Self-pay | Admitting: Psychiatry

## 2018-05-06 ENCOUNTER — Telehealth: Payer: Self-pay | Admitting: Psychiatry

## 2018-05-06 DIAGNOSIS — F250 Schizoaffective disorder, bipolar type: Secondary | ICD-10-CM

## 2018-05-06 MED ORDER — PERPHENAZINE 16 MG PO TABS: 16.0000 mg | ORAL_TABLET | Freq: Every day | ORAL | 0 refills | Status: DC

## 2018-05-06 NOTE — Progress Notes (Signed)
Matthew [name] phones that he must stop [name] due to cost and substitute alternative.  As [name] considered last appointment may prolong the QTC last recorded is 450 ms with first-degree AV block, we will use perphenazine 16 mg nightly next month #30 and no refill [facility] [address] until [name] having a long half-life is significantly rescinded and can titrate accordingly possibly with asenapine the next option.

## 2018-05-06 NOTE — Telephone Encounter (Signed)
Matthew [name] called today to request a change of the rexulti.  He is out of work and on [employer] insurance and he is required to [facility] [place].  This will not work for him so [name] is too expensive to get elsewhere.  Thus the request for a change in the medication.  Next appt is 05/29/18.  [facility] [address]  His phone is [phone]

## 2018-05-26 ENCOUNTER — Other Ambulatory Visit: Payer: Self-pay | Admitting: Psychiatry

## 2018-05-26 DIAGNOSIS — F411 Generalized anxiety disorder: Secondary | ICD-10-CM

## 2018-06-03 ENCOUNTER — Encounter: Payer: Self-pay | Admitting: Psychiatry

## 2018-06-03 ENCOUNTER — Ambulatory Visit (INDEPENDENT_AMBULATORY_CARE_PROVIDER_SITE_OTHER): Payer: BLUE CROSS/BLUE SHIELD | Admitting: Psychiatry

## 2018-06-03 VITALS — BP 132/88 | HR 80 | Ht 75.0 in | Wt 282.0 lb

## 2018-06-03 DIAGNOSIS — F250 Schizoaffective disorder, bipolar type: Secondary | ICD-10-CM | POA: Diagnosis not present

## 2018-06-03 DIAGNOSIS — F411 Generalized anxiety disorder: Secondary | ICD-10-CM

## 2018-06-03 DIAGNOSIS — G4733 Obstructive sleep apnea (adult) (pediatric): Secondary | ICD-10-CM | POA: Diagnosis not present

## 2018-06-03 DIAGNOSIS — F902 Attention-deficit hyperactivity disorder, combined type: Secondary | ICD-10-CM | POA: Diagnosis not present

## 2018-06-03 MED ORDER — AMPHETAMINE-DEXTROAMPHETAMINE 30 MG PO TABS: 30.0000 mg | ORAL_TABLET | Freq: Two times a day (BID) | ORAL | 0 refills | Status: DC

## 2018-06-03 MED ORDER — GABAPENTIN 400 MG PO CAPS: ORAL_CAPSULE | ORAL | 2 refills | Status: DC

## 2018-06-03 MED ORDER — TRAZODONE HCL 100 MG PO TABS: 100.0000 mg | ORAL_TABLET | Freq: Every evening | ORAL | 2 refills | Status: DC | PRN

## 2018-06-03 MED ORDER — LITHIUM CARBONATE ER 300 MG PO TBCR: 600.0000 mg | EXTENDED_RELEASE_TABLET | Freq: Every day | ORAL | 2 refills | Status: DC

## 2018-06-03 MED ORDER — PERPHENAZINE 16 MG PO TABS: 32.0000 mg | ORAL_TABLET | Freq: Every day | ORAL | 2 refills | Status: DC

## 2018-06-03 MED ORDER — CLONAZEPAM 1 MG PO TABS: 1.0000 mg | ORAL_TABLET | Freq: Three times a day (TID) | ORAL | 2 refills | Status: DC | PRN

## 2018-06-03 NOTE — Progress Notes (Signed)
[facility]  Patient ID: Matthew [name],  MRN: [id number]  PCP: Henrine Screws, MD  Date of Evaluation: 06/03/2018 Time spent:20 minutes  Chief Complaint:  Chief Complaint    ADHD; Anxiety; Depression; Paranoid      HISTORY/CURRENT STATUS: Matthew [name] is seen individually face-to-face with consent not collateral for psychiatric interview and exam in 3-month evaluation and management of schizoaffective bipolar, GAD, and ADHD and job loss as his greatest trigger for increasing depression the last month.  He requested lithium be restarted but this was deferred as patient in time of transition did attend intake at [facility] but refuses to continue care there, returning here.  He notes father's questions about his coarse hand action tremor which is chronic and considered exacerbated by medications through the past.  Transition from [name] to Trilafon has been successful patient having to double the dose from 16 to 32 mg nightly for relapse of auditory hallucinations of several weeks now improved.  He would prefer [name] but he is not seeking primary care or cardiology clearance for [name] which may prolong QTC.  I provided him a copy of his prolonged QTC of 450 ms with first-degree AV block from 2016 in the [facility] system.  He has an entry in the [facility] system evident in epic of EKG with PR of 204 ms and QTC of 409 ms from 08/06/2015.  He has chronic high-dose medication asking to increase his gabapentin for his back pain.  He is conflicted about his scheduled 06/14/2018 disability physical at [facility] orthopedics as part of his disability application, no longer attending the pain clinic and taking the gabapentin in place of Lyrica.  He has no suicidal ideation or mania but notes that he was very depressed at Christmas and continues to have increased depression.  He does have an appointment at 1 PM today at the [facility] house as discussed with parents when they last attended as a  positive coping step to his credit.  Depression       The patient presents with depression.  This is a recurrent problem.  The current episode started more than 1 year ago.   The onset quality is sudden.   The problem occurs intermittently.  The problem has been gradually worsening since onset.  Associated symptoms include decreased concentration, fatigue, helplessness, hopelessness, decreased interest and sad.  Associated symptoms include no body aches, no headaches, no indigestion and no suicidal ideas.     The symptoms are aggravated by medication, social issues and family issues.  Past treatments include other medications, psychotherapy and SNRIs - Serotonin and norepinephrine reuptake inhibitors.  Compliance with treatment is variable.  Past compliance problems include difficulty with treatment plan, medication issues, difficulty understanding directions, medical issues, pharmacy issues and insurance issues.  Previous treatment provided mild relief.  Risk factors include family history, history of suicide attempt, a change in medication usage/dosage, prior psychiatric admission, major life event and stress.   Past medical history includes chronic pain, chronic illness, physical disability, anxiety, bipolar disorder, depression, mental health disorder, schizophrenia and suicide attempts.     Pertinent negatives include no thyroid problem, no recent illness, no eating disorder, no obsessive-compulsive disorder, no post-traumatic stress disorder and no head trauma.   Individual Medical History/ Review of Systems: Changes? :No   Allergies: Gluten meal  Current Medications:  Current Outpatient Medications:  .  amphetamine-dextroamphetamine (ADDERALL) 30 MG tablet, Take 1 tablet by mouth 2 (two) times daily., Disp: 60 tablet, Rfl: 0 .  amphetamine-dextroamphetamine (ADDERALL) 30 MG tablet, Take 1 tablet by mouth 2 (two) times daily., Disp: 60 tablet, Rfl: 0 .  amphetamine-dextroamphetamine (ADDERALL) 30  MG tablet, Take 1 tablet by mouth 2 (two) times daily., Disp: 60 tablet, Rfl: 0 .  atenolol (TENORMIN) 100 MG tablet, Take 1 tablet (100 mg total) by mouth every morning. For high blood pressure, Disp: 30 tablet, Rfl: 3 .  clonazePAM (KLONOPIN) 1 MG tablet, Take 1 tablet (1 mg total) by mouth 3 (three) times daily as needed for anxiety., Disp: 90 tablet, Rfl: 2 .  DULoxetine (CYMBALTA) 60 MG capsule, Take 1 capsule (60 mg total) by mouth 2 (two) times daily. For depression, Disp: 60 capsule, Rfl: 0 .  fluticasone (FLONASE) 50 MCG/ACT nasal spray, Place 1 spray into both nostrils as needed for allergies or rhinitis., Disp: , Rfl: 2 .  gabapentin (NEURONTIN) 400 MG capsule, TAKE 1 CAPSULE 3 TIMES A DAY FOR AGITATION, Disp: 90 capsule, Rfl: 0 .  nicotine polacrilex (NICORETTE) 2 MG gum, Take 1 each (2 mg total) by mouth as needed for smoking cessation., Disp: 100 tablet, Rfl: 0 .  oxyCODONE (OXY IR/ROXICODONE) 5 MG immediate release tablet, Take 1 tablet (5 mg total) by mouth every 8 (eight) hours as needed for moderate pain or severe pain., Disp: 6 tablet, Rfl: 0 .  perphenazine (TRILAFON) 16 MG tablet, Take 1 tablet (16 mg total) by mouth at bedtime., Disp: 30 tablet, Rfl: 0 .  pregabalin (LYRICA) 100 MG capsule, Take 1 capsule (100mg ) 3 times daily (300mg  total), Disp: 90 capsule, Rfl: 1 .  tiZANidine (ZANAFLEX) 4 MG capsule, Take 1 capsule (4 mg total) by mouth 3 (three) times daily as needed (headache)., Disp: 20 capsule, Rfl: 0 .  traZODone (DESYREL) 100 MG tablet, Take 1 tablet (100 mg total) by mouth at bedtime as needed for sleep., Disp: 30 tablet, Rfl: 0 Medication Side Effects: none  Family Medical/ Social History: Changes? No  MENTAL HEALTH EXAM: 2+ coarse action tremor but AIMS otherwise equals 0, strength 5/5 and postural reflexes 0/0 having EKG conduction concerns in the past not attending to general medical care needs that his back pain now ceased to attend the pain clinic due to job  loss Blood pressure 132/88, pulse 80, height 6\' 3"  (1.905 m), weight 282 lb (127.9 kg).Body mass index is 35.25 kg/m.  General Appearance: Casual, Disheveled, Guarded and Obese  Eye Contact:  Fair  Speech:  Slow  Volume:  Normal  Mood:  Anxious, Depressed, Dysphoric and Worthless  Affect:  Constricted, Depressed and Anxious  Thought Process:  Disorganized and Linear  Orientation:  Full (Time, Place, and Person)  Thought Content: Illogical and Rumination   Suicidal Thoughts:  No  Homicidal Thoughts:  No  Memory:  Immediate;   Fair Remote;   Fair  Judgement:  Impaired  Insight:  Lacking  Psychomotor Activity:  Decreased  Concentration:  Concentration: Fair and Attention Span: Poor  Recall:  [employer]  Fund of Knowledge: Fair  Language: Good  Assets:  Desire for Improvement Housing Transportation  ADL's:  Intact  Cognition: WNL  Prognosis:  Poor    DIAGNOSES:    ICD-10-CM   1. Schizoaffective disorder, bipolar type (HCC) F25.0   2. Generalized anxiety disorder F41.1   3. Attention deficit hyperactivity disorder (ADHD), combined type, moderate F90.2   4. OSA (obstructive sleep apnea) G47.33     Receiving Psychotherapy: No    RECOMMENDATIONS: He attends disability exam next week and [facility]  He will maintain the Trilafon and other medications but add lithium augmentation to his depression management.  His current supply of duloxetine 60 mg 2 capsule every morning and atenolol 100 mg daily for hypertension and anxiety/tremor though needing primary care and/or cardiology management of EKG and hypertension.  He is prescribed to continue Trazadone 100 mg nightly as #30 with 2 refills for insomnia of anxiety and schizoaffective sent to [facility] [address].  Medications updatate supplies Klonopin 1 mg 3 times daily for 90 with 2 refills for anxiety, gabapentin 400 mg 3 times daily #90 with 2 refills for chronic pain and anxiety, and Adderall 30 mg IR twice daily #60  each for January, February, and March for [facility]  Trilafon 16 mg continues as two tablets nightly total 32 mg prescribed #60 with 2 [facility] to [facility] for schizoaffective.  His lithium is restarted at 300 mg ER taking 2 every bedtime as #60 and 2 refills [facility] for schizoaffective bipolar depression.  He is educated on medications, chronic and acute care needs, and returns here in 3 months unless more suitable psychiatric outpatient care is established in the course of [facility] and disability determination.   Chauncey MannGlenn E Jennings, MD

## 2018-06-10 ENCOUNTER — Ambulatory Visit: Payer: 59 | Admitting: Psychiatry

## 2018-06-23 ENCOUNTER — Other Ambulatory Visit: Payer: Self-pay | Admitting: Psychiatry

## 2018-06-23 DIAGNOSIS — F250 Schizoaffective disorder, bipolar type: Secondary | ICD-10-CM

## 2018-06-23 DIAGNOSIS — F902 Attention-deficit hyperactivity disorder, combined type: Secondary | ICD-10-CM

## 2018-06-23 DIAGNOSIS — F411 Generalized anxiety disorder: Secondary | ICD-10-CM

## 2018-06-24 ENCOUNTER — Other Ambulatory Visit: Payer: Self-pay | Admitting: Psychiatry

## 2018-06-24 DIAGNOSIS — F411 Generalized anxiety disorder: Secondary | ICD-10-CM

## 2018-07-22 ENCOUNTER — Other Ambulatory Visit (HOSPITAL_COMMUNITY): Payer: Self-pay | Admitting: Nurse Practitioner

## 2018-07-22 ENCOUNTER — Other Ambulatory Visit: Payer: Self-pay | Admitting: Nurse Practitioner

## 2018-07-22 DIAGNOSIS — R112 Nausea with vomiting, unspecified: Secondary | ICD-10-CM

## 2018-07-23 ENCOUNTER — Ambulatory Visit (HOSPITAL_COMMUNITY)
Admission: RE | Admit: 2018-07-23 | Discharge: 2018-07-23 | Disposition: A | Discharge status: Home / Self Care | Payer: BLUE CROSS/BLUE SHIELD | Source: Ambulatory Visit | Attending: Nurse Practitioner | Admitting: Nurse Practitioner

## 2018-07-23 ENCOUNTER — Other Ambulatory Visit: Payer: Self-pay | Admitting: Psychiatry

## 2018-07-23 DIAGNOSIS — F250 Schizoaffective disorder, bipolar type: Secondary | ICD-10-CM

## 2018-07-23 DIAGNOSIS — R112 Nausea with vomiting, unspecified: Secondary | ICD-10-CM | POA: Diagnosis not present

## 2018-07-23 IMAGING — US US ABDOMEN COMPLETE
1 series · 14 of 25 positions shown · non-contrast
Comparison: [DATE]

CLINICAL DATA: Abdominal pain.  Nausea and vomiting.  Leukocytosis.

EXAM:
ABDOMEN ULTRASOUND COMPLETE

[Series 1: us abdomen complete · 14 of 75 slices shown]
[im 1/75]
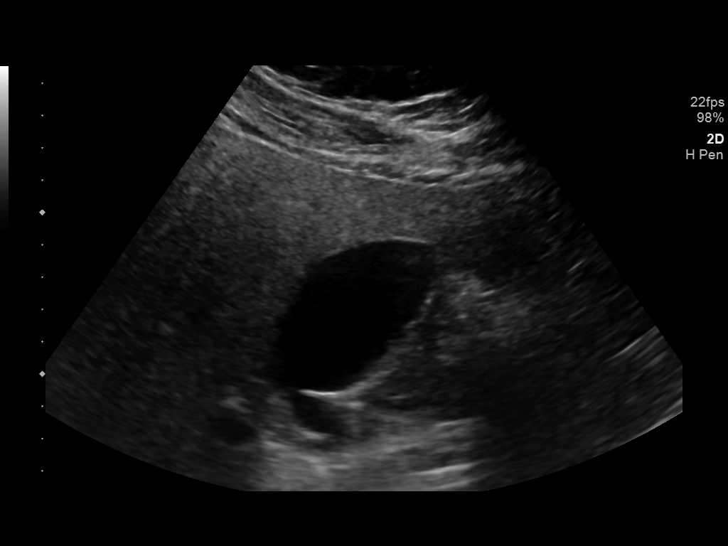
[im 7/75]
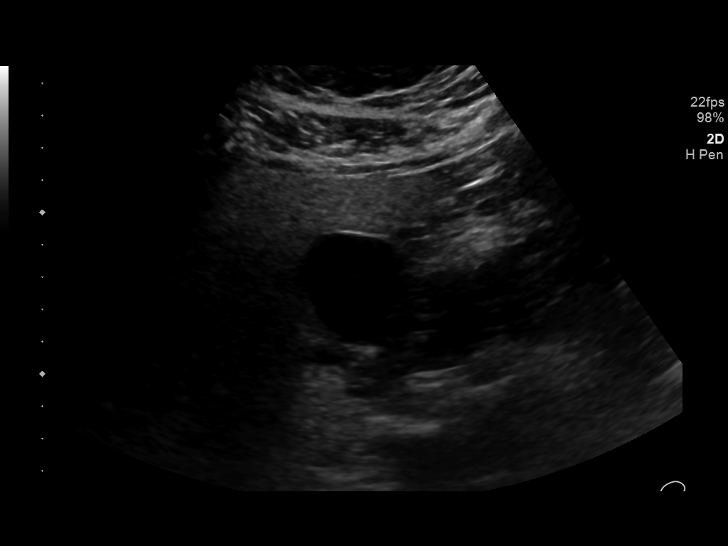
[im 13/75]
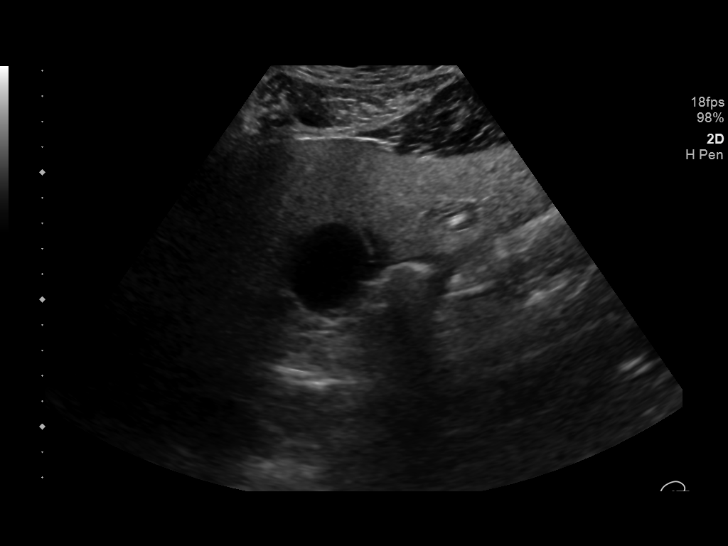
[im 19/75]
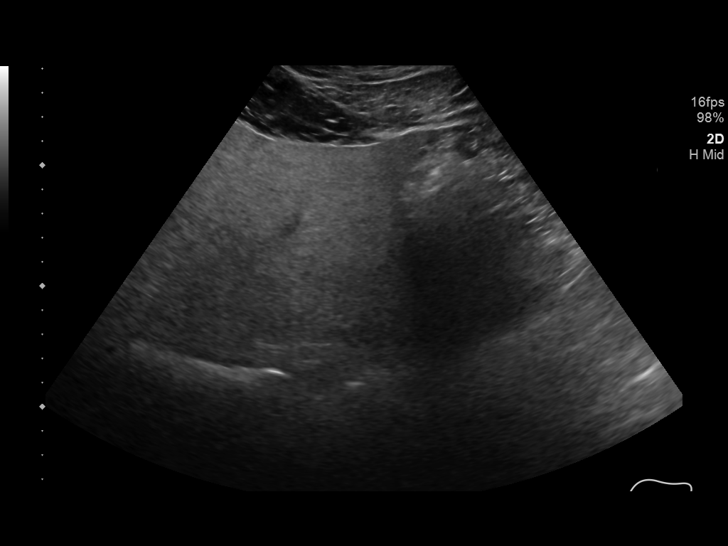
[im 25/75]
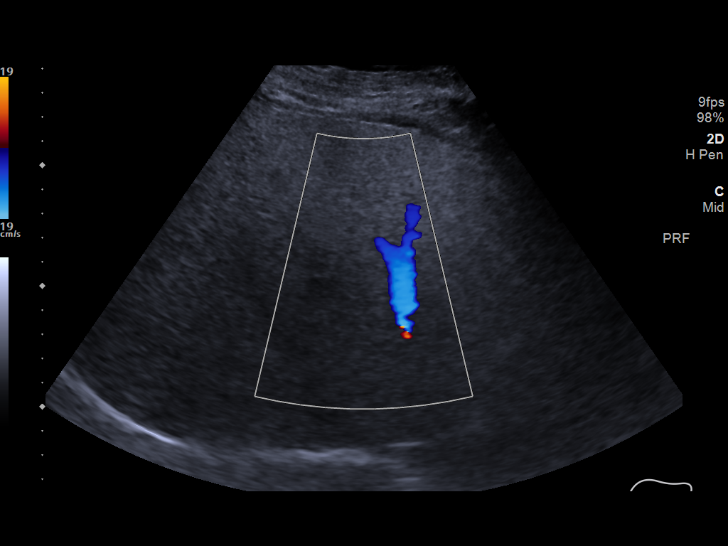
[im 28/75]
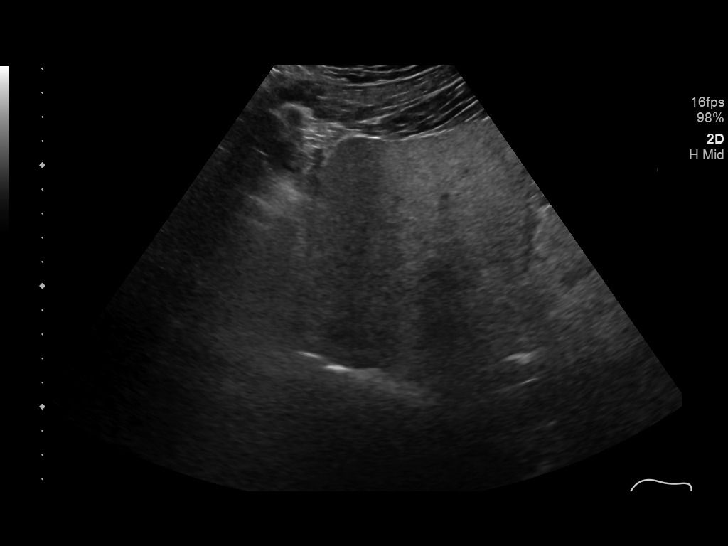
[im 34/75]
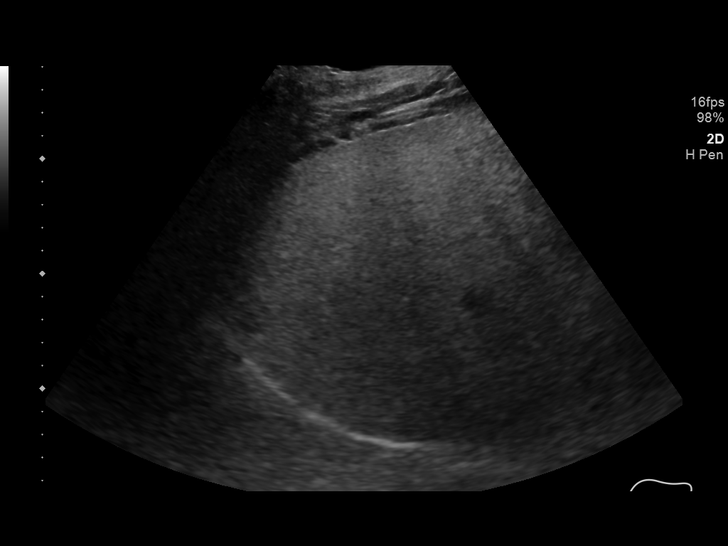
[im 41/75]
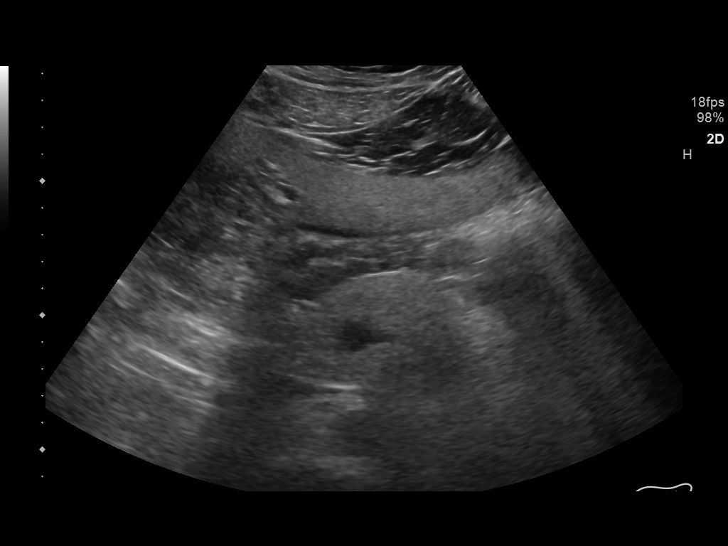
[im 47/75]
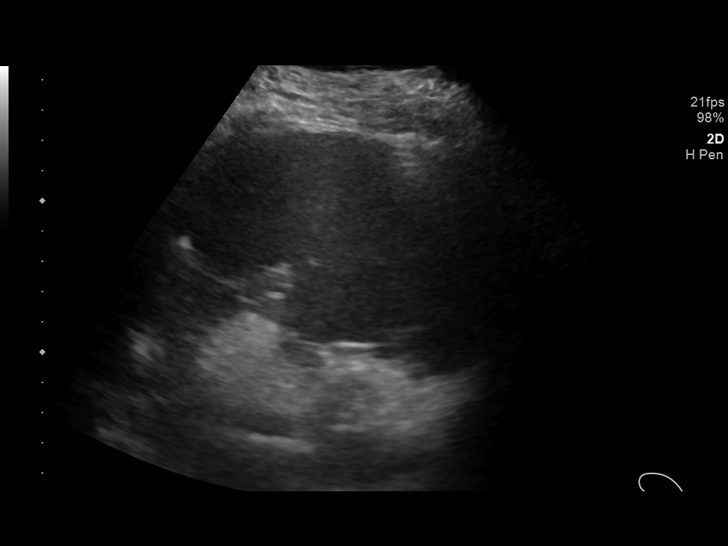
[im 50/75]
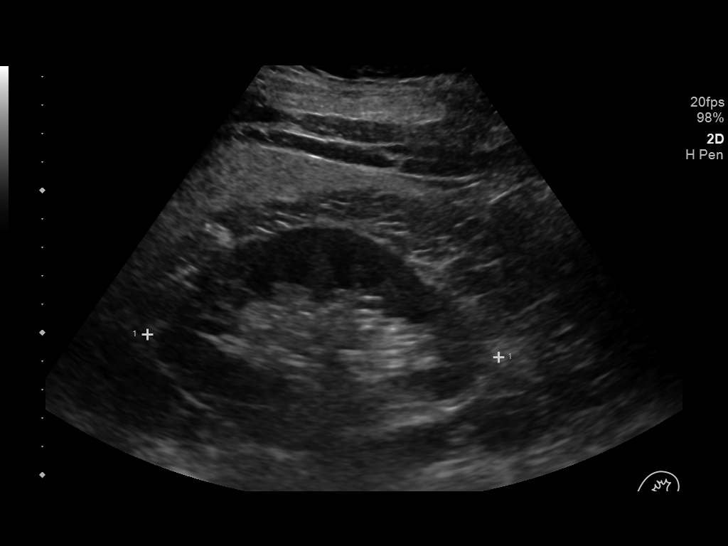
[im 56/75]
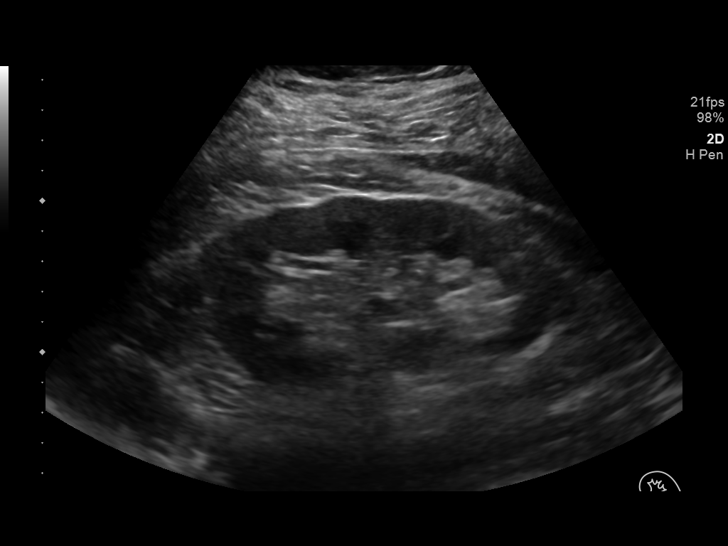
[im 62/75]
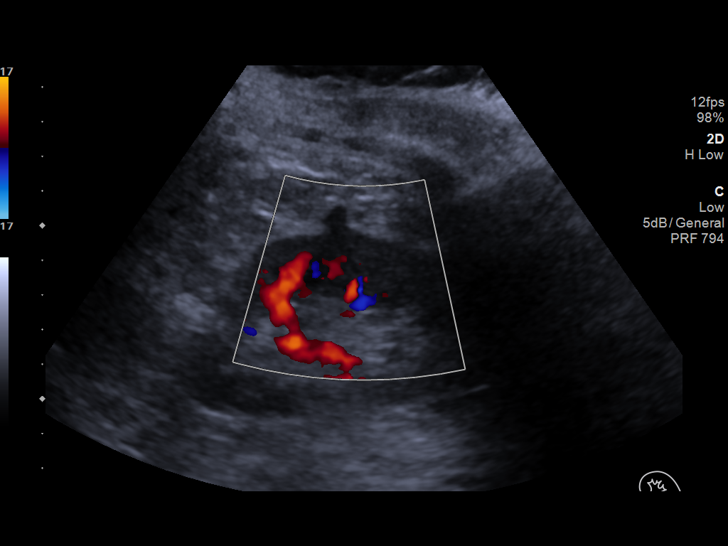
[im 68/75]
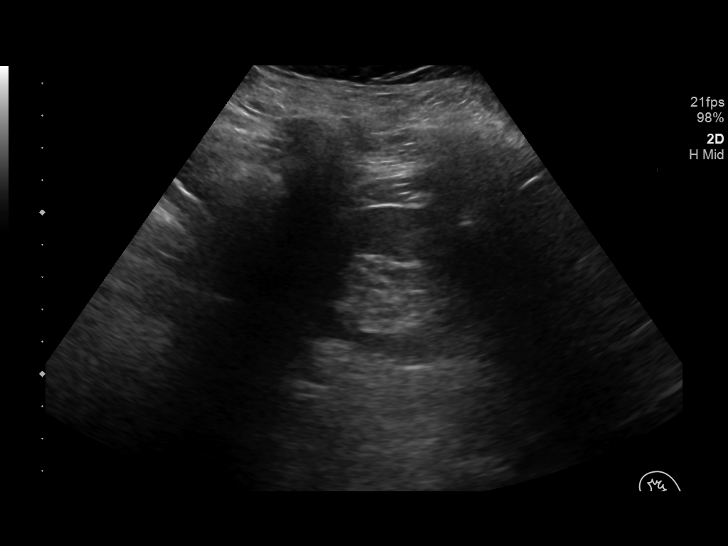
[im 75/75]
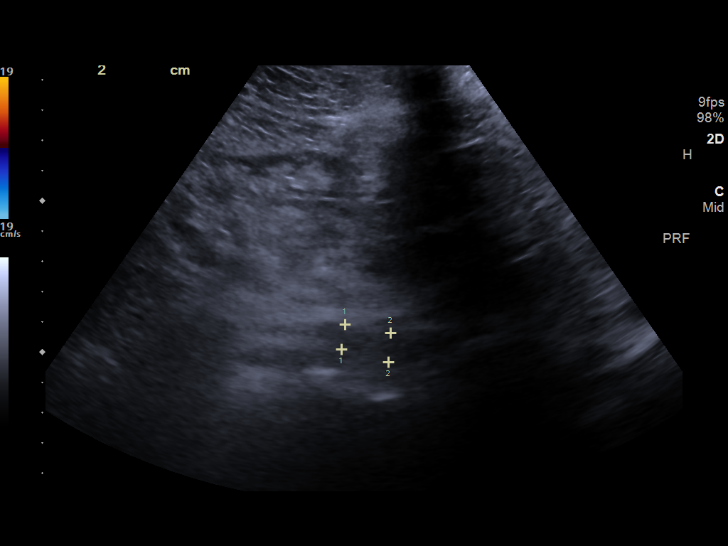

[14 of 25 positions shown; findings below may reference images not displayed]

FINDINGS: Gallbladder: No gallstones or wall thickening visualized. No
sonographic Murphy sign noted by sonographer.

Common bile duct: Diameter: 4 mm, within normal limits.

Liver: Diffusely increased echogenicity of the hepatic parenchyma,
consistent with hepatic steatosis. No hepatic mass identified.
Portal vein is patent on color Doppler imaging with normal direction
of blood flow towards the liver.

IVC: No abnormality visualized.

Pancreas: Visualized portion unremarkable.

Spleen: Size and appearance within normal limits.

Right Kidney: Length: 12.4 cm. Echogenicity within normal limits. No
mass or hydronephrosis visualized.

Left Kidney: Length: 12.6 cm. Echogenicity within normal limits. 1
cm subcapsular cyst seen in midpole of left kidney. No mass or
hydronephrosis visualized.

Abdominal aorta: No aneurysm visualized.

Other findings: None.
IMPRESSION: No evidence of cholelithiasis, biliary ductal dilatation, or other
acute findings.

Diffuse hepatic steatosis.

## 2018-07-28 ENCOUNTER — Other Ambulatory Visit: Payer: Self-pay | Admitting: Gastroenterology

## 2018-07-28 DIAGNOSIS — D72829 Elevated white blood cell count, unspecified: Secondary | ICD-10-CM

## 2018-07-28 DIAGNOSIS — R112 Nausea with vomiting, unspecified: Secondary | ICD-10-CM

## 2018-07-28 DIAGNOSIS — R634 Abnormal weight loss: Secondary | ICD-10-CM

## 2018-07-31 ENCOUNTER — Inpatient Hospital Stay: Admission: RE | Admit: 2018-07-31 | Payer: Self-pay | Source: Ambulatory Visit

## 2018-08-07 ENCOUNTER — Other Ambulatory Visit: Payer: Self-pay | Admitting: Gastroenterology

## 2018-08-07 DIAGNOSIS — R112 Nausea with vomiting, unspecified: Secondary | ICD-10-CM

## 2018-08-07 DIAGNOSIS — R634 Abnormal weight loss: Secondary | ICD-10-CM

## 2018-08-07 DIAGNOSIS — D72829 Elevated white blood cell count, unspecified: Secondary | ICD-10-CM

## 2018-09-02 ENCOUNTER — Ambulatory Visit (INDEPENDENT_AMBULATORY_CARE_PROVIDER_SITE_OTHER): Payer: Medicaid Other | Admitting: Psychiatry

## 2018-09-02 ENCOUNTER — Other Ambulatory Visit: Payer: Self-pay | Admitting: Psychiatry

## 2018-09-02 ENCOUNTER — Other Ambulatory Visit: Payer: Self-pay

## 2018-09-02 ENCOUNTER — Encounter: Payer: Self-pay | Admitting: Psychiatry

## 2018-09-02 DIAGNOSIS — F902 Attention-deficit hyperactivity disorder, combined type: Secondary | ICD-10-CM

## 2018-09-02 DIAGNOSIS — G4733 Obstructive sleep apnea (adult) (pediatric): Secondary | ICD-10-CM

## 2018-09-02 DIAGNOSIS — F250 Schizoaffective disorder, bipolar type: Secondary | ICD-10-CM

## 2018-09-02 DIAGNOSIS — F411 Generalized anxiety disorder: Secondary | ICD-10-CM

## 2018-09-02 MED ORDER — AMPHETAMINE-DEXTROAMPHETAMINE 30 MG PO TABS: 30.0000 mg | ORAL_TABLET | Freq: Two times a day (BID) | ORAL | 0 refills | Status: DC

## 2018-09-02 MED ORDER — DULOXETINE HCL 60 MG PO CPEP: 120.0000 mg | ORAL_CAPSULE | Freq: Every day | ORAL | 2 refills | Status: DC

## 2018-09-02 MED ORDER — CLONAZEPAM 1 MG PO TABS: 1.0000 mg | ORAL_TABLET | Freq: Three times a day (TID) | ORAL | 2 refills | Status: DC | PRN

## 2018-09-02 MED ORDER — LITHIUM CARBONATE ER 300 MG PO TBCR: 600.0000 mg | EXTENDED_RELEASE_TABLET | Freq: Every day | ORAL | 2 refills | Status: DC

## 2018-09-02 MED ORDER — TRAZODONE HCL 100 MG PO TABS: 100.0000 mg | ORAL_TABLET | Freq: Every evening | ORAL | 2 refills | Status: DC | PRN

## 2018-09-02 MED ORDER — ATENOLOL 100 MG PO TABS: 100.0000 mg | ORAL_TABLET | Freq: Every day | ORAL | 2 refills | Status: DC

## 2018-09-02 MED ORDER — PERPHENAZINE 16 MG PO TABS: 32.0000 mg | ORAL_TABLET | Freq: Every day | ORAL | 2 refills | Status: DC

## 2018-09-02 MED ORDER — GABAPENTIN 600 MG PO TABS: 600.0000 mg | ORAL_TABLET | Freq: Three times a day (TID) | ORAL | 2 refills | Status: DC

## 2018-09-02 NOTE — Progress Notes (Signed)
[facility]  Patient ID: Matthew [name],  MRN: [id number]  PCP: Henrine Screws, MD  Date of Evaluation: 09/02/2018 Time spent:20 minutes  I connected with patient by a video enabled telemedicine application or telephone, with their informed consent, and verified patient privacy and that I am speaking with the correct person using two identifiers.  I was located at [employer] and patient at residence of parents.  Chief Complaint:  Chief Complaint    Paranoid; Depression; Manic Behavior; Anxiety; ADHD      HISTORY/CURRENT STATUS: Matthew [name] is provided telemedicine audio appointment session, declining video due to generalized anxiety, with consent without collateral for psychiatric interview and exam in 59-month evaluation and management of schizoaffective bipolar, generalized anxiety, ADHD and obstructive sleep apnea.  The patient's general metabolic, hypertensive, smoking, cardiac, headache, back pain, and sleep apnea disorders are tied to his mental illness.  Parents are unrealistic about extent of his psychiatric and medical disorders.  Parents advise that he get off his medications as much as possible and live independently while patient has attempted to show them his disabled status and consequences.  At last appointment, his transition from maximum dose [name] to perphenazine 25% of the maximum dose had to be advanced to perphenazine at 50% of the maximum dose as lithium was restarted in the augmenting dose of 600 mg CR nightly.  Patient is significantly improved over 3 months ago from [facility] house, medications, and restoring some capacity to hope for some self-sufficiency.  He comes today to get his medications renewed wanting no changes except gabapentin dose which has replaced Lyrica is not sufficient in his experience for his chronic back pain.  He has low-grade psychotic features of episodic auditory hallucinations and delusions that are not intensifying, increasing  in frequency or number, or bearing consequences that make him a risk to self or others.  For these chronic psychotic experiences he keeps to himself, we process he must discuss these at the [facility] house program.  He uses tobacco but not alcohol, cannabis, or other drugs.  Depression       The patient presents with depression.  This is a recurrent problem.  The current episode started more than 1 month ago.   The onset quality is sudden.   The problem occurs intermittently.  The problem has been gradually improving since onset.  Associated symptoms include decreased concentration, fatigue, hopelessness, irritable, decreased interest and sad.  Associated symptoms include no helplessness, does not have insomnia, no appetite change, no headaches, no indigestion and no suicidal ideas.     The symptoms are aggravated by family issues and social issues.  Past treatments include SSRIs - Selective serotonin reuptake inhibitors, SNRIs - Serotonin and norepinephrine reuptake inhibitors, other medications and psychotherapy.  Compliance with treatment is variable and good.  Past compliance problems include difficulty with treatment plan, medication issues, medical issues and difficulty understanding directions.  Previous treatment provided mild relief.  Risk factors include a change in medication usage/dosage, family history, family history of mental illness, history of mental illness, history of self-injury, history of suicide attempt, major life event, prior psychiatric admission, stress and the patient not taking medications correctly.   Past medical history includes physical disability, recent psychiatric admission, anxiety, bipolar disorder, depression, schizophrenia and suicide attempts.     Pertinent negatives include no brain trauma, no eating disorder, no mental health disorder, no obsessive-compulsive disorder, no post-traumatic stress disorder and no head trauma.   Individual Medical History/ Review of  Systems: Changes? :  Yes Patient describes 2 months of episodic vomiting for which [facility]  where the patient describes being provided medication with relief therefore not needing many other tests.  His primary care transferred because of Medicaid patient recently obtained so that he has a general medical exam scheduled in June but hopefully can include EKG for history of first-degree heart block and borderline QTC labs for lithium, perphenazine, and Adderall as educated.  Options for psychiatric care with Medicaid providers are again reviewed.  Patient indicates his preference to come here as he has done so for 3 years and does not want to change after unfair treatment by other persons frequently again.  Allergies: Gluten meal  Current Medications:  Current Outpatient Medications:  .  amphetamine-dextroamphetamine (ADDERALL) 30 MG tablet, Take 1 tablet by mouth 2 (two) times daily., Disp: 60 tablet, Rfl: 0 .  amphetamine-dextroamphetamine (ADDERALL) 30 MG tablet, Take 1 tablet by mouth 2 (two) times daily for 30 days., Disp: 60 tablet, Rfl: 0 .  [START ON 10/02/2018] amphetamine-dextroamphetamine (ADDERALL) 30 MG tablet, Take 1 tablet by mouth 2 (two) times daily for 30 days., Disp: 60 tablet, Rfl: 0 .  [START ON 11/01/2018] amphetamine-dextroamphetamine (ADDERALL) 30 MG tablet, Take 1 tablet by mouth 2 (two) times daily for 30 days., Disp: 60 tablet, Rfl: 0 .  atenolol (TENORMIN) 100 MG tablet, Take 1 tablet (100 mg total) by mouth daily after breakfast., Disp: 30 tablet, Rfl: 2 .  clonazePAM (KLONOPIN) 1 MG tablet, Take 1 tablet (1 mg total) by mouth 3 (three) times daily as needed for anxiety., Disp: 90 tablet, Rfl: 2 .  DULoxetine (CYMBALTA) 60 MG capsule, Take 2 capsules (120 mg total) by mouth daily after breakfast., Disp: 60 capsule, Rfl: 2 .  fluticasone (FLONASE) 50 MCG/ACT nasal spray, Place 1 spray into both nostrils as needed for allergies or rhinitis., Disp: , Rfl: 2 .   gabapentin (NEURONTIN) 600 MG tablet, Take 1 tablet (600 mg total) by mouth 3 (three) times daily., Disp: 90 tablet, Rfl: 2 .  lithium carbonate (LITHOBID) 300 MG CR tablet, Take 2 tablets (600 mg total) by mouth at bedtime., Disp: 60 tablet, Rfl: 2 .  nicotine polacrilex (NICORETTE) 2 MG gum, Take 1 each (2 mg total) by mouth as needed for smoking cessation., Disp: 100 tablet, Rfl: 0 .  oxyCODONE (OXY IR/ROXICODONE) 5 MG immediate release tablet, Take 1 tablet (5 mg total) by mouth every 8 (eight) hours as needed for moderate pain or severe pain., Disp: 6 tablet, Rfl: 0 .  perphenazine (TRILAFON) 16 MG tablet, Take 2 tablets (32 mg total) by mouth at bedtime., Disp: 60 tablet, Rfl: 2 .  tiZANidine (ZANAFLEX) 4 MG capsule, Take 1 capsule (4 mg total) by mouth 3 (three) times daily as needed (headache)., Disp: 20 capsule, Rfl: 0 .  traZODone (DESYREL) 100 MG tablet, Take 1 tablet (100 mg total) by mouth at bedtime as needed for sleep., Disp: 30 tablet, Rfl: 2   Medication Side Effects: fatigue/weakness, GI irritation and weight gain  Family Medical/ Social History: Changes? Yes Parents seek to retire to reduced size of residence which requires patient to establish his independent residence. Patient's SSI was denied the 1st time so he is appealing.  [facility] started [facility] with benefit now on temporary hold by closures due to national emergency coronavirus pandemic.  MENTAL HEALTH EXAM:  There were no vitals taken for this visit.There is no height or weight on file to calculate  BMI.as not present here  General Appearance: N/A  Eye Contact:  N/A  Speech:  Clear and Coherent, Slow and Talkative  Volume:  Normal  Mood:  Anxious, Depressed, Dysphoric, Irritable and Worthless  Affect:  Blunt, Depressed, Inappropriate, Labile and Anxious  Thought Process:  Disorganized, Goal Directed and Linear  Orientation:  Full (Time, Place, and Person)  Thought Content: Delusions, Hallucinations: Auditory,  Obsessions, Paranoid Ideation and Rumination   Suicidal Thoughts:  No  Homicidal Thoughts:  No  Memory:  Immediate;   Fair Remote;   Fair  Judgement:  Fair  Insight:  Fair and Lacking  Psychomotor Activity:  Normal, Increased, Mannerisms and Restlessness  Concentration:  Concentration: Fair and Attention Span: Poor  Recall:  [employer] of Knowledge: Fair  Language: Fair  Assets:  Leisure Time Resilience Talents/Skills  ADL's:  Intact  Cognition: WNL  Prognosis:  Poor    DIAGNOSES:    ICD-10-CM   1. Schizoaffective disorder, bipolar type without good prognostic features (HCC) F25.0   2. Generalized anxiety disorder F41.1 atenolol (TENORMIN) 100 MG tablet    clonazePAM (KLONOPIN) 1 MG tablet    DULoxetine (CYMBALTA) 60 MG capsule    gabapentin (NEURONTIN) 600 MG tablet    traZODone (DESYREL) 100 MG tablet  3. Attention deficit hyperactivity disorder (ADHD), combined type, moderate F90.2 amphetamine-dextroamphetamine (ADDERALL) 30 MG tablet    amphetamine-dextroamphetamine (ADDERALL) 30 MG tablet    amphetamine-dextroamphetamine (ADDERALL) 30 MG tablet    DULoxetine (CYMBALTA) 60 MG capsule  4. OSA (obstructive sleep apnea) G47.33   5. Schizoaffective disorder, bipolar type (HCC) F25.0 lithium carbonate (LITHOBID) 300 MG CR tablet    perphenazine (TRILAFON) 16 MG tablet    traZODone (DESYREL) 100 MG tablet    Receiving Psychotherapy: No except [employer]   RECOMMENDATIONS: His gabapentin is increased to 600 mg 3 times daily sent as #90 with 2 refills to [facility] [address] for anxiety, sleep disorder, and chronic back pain.Klonopin is sent as 1 mg 3 times daily as needed for anxiety #90 with 2 refills to [facility] [address].  Adderall is sent as 30 mg IR twice daily #60 each for April, May, and June for ADHD.  Cymbalta 60 mg capsule is sent as 2 capsules total 120 mg every morning #60 with 2 [facility] for generalized anxiety, bipolar depression, and ADHD.   Lithium carbonate is sent as 300 mg CR tablet taking 2 tablets total 600 mg every bedtime as #60 with [facility] for schizoaffective bipolar.  Trilafon 16 mg tablets is sent as 2 tablets total 32 mg every bedtime #60 with 2 refills for schizoaffective disorder.  Trazodone is sent as 100 mg every bedtime #30 with 2 refills for obstructive sleep apnea.  Atenolol 100 mg every morning #30 with 2 [facility] for anxiety and hypertension.  Hopefully during his general medical exam in June he can have fasting lithium level, lipid panel, hemoglobin A1c, comprehensive metabolic panel, TSH, early morning urinalysis, and prolactin.  Continue [facility], disability determination services, seeking employment possibly with [employer], and seeking residence apart from retiring parents.  He returns in 3 months unless he finds a Medicaid provider he appreciates.  Virtual Visit via Telephone Note  I connected with Matthew [name] on 09/02/18 at 11:20 AM EDT by telephone and verified that I am speaking with the correct person using two identifiers.   I discussed the limitations, risks, security and privacy concerns of performing an  evaluation and management service by telephone and the availability of in person appointments. I also discussed with the patient that there may be a patient responsible charge related to this service. The patient expressed understanding and agreed to proceed.   History of Present Illness: 45-month evaluation and management of schizoaffective bipolar, generalized anxiety, ADHD and obstructive sleep apnea.  The patient's general metabolic, hypertensive, smoking, cardiac, headache, back pain, and sleep apnea disorders are tied to his mental illness.  Parents are unrealistic about extent of his psychiatric and medical disorders.   Observations/Objective: Mood:  Anxious, Depressed, Dysphoric, Irritable and Worthless  Affect:  Blunt, Depressed, Inappropriate, Labile and Anxious   Thought Process:  Disorganized, Goal Directed and Linear  Orientation:  Full (Time, Place, and Person)  Thought Content: Delusions, Hallucinations: Auditory, Obsessions, Paranoid Ideation and Rumination     Assessment and Plan: Gabapentin is increased to 600 mg 3 times daily sent as #90 with 2 refills to [facility] [address] for anxiety, sleep disorder, and chronic back pain.Klonopin is sent as 1 mg 3 times daily as needed for anxiety #90 with 2 refills to [facility] [address].  Adderall is sent as 30 mg IR twice daily #60 each for April, May, and June for ADHD.  Cymbalta 60 mg capsule is sent as 2 capsules total 120 mg every morning #60 with 2 [facility] for generalized anxiety, bipolar depression, and ADHD.  Lithium carbonate is sent as 300 mg CR tablet taking 2 tablets total 600 mg every bedtime as #60 with [facility] for schizoaffective bipolar.  Trilafon 16 mg tablets is sent as 2 tablets total 32 mg every bedtime #60 with 2 refills for schizoaffective disorder.  Trazodone is sent as 100 mg every bedtime #30 with 2 refills for obstructive sleep apnea.  Atenolol 100 mg every morning #30 with 2 [facility] for anxiety and hypertension.  Follow Up Instructions: He returns in 3 months    I discussed the assessment and treatment plan with the patient. The patient was provided an opportunity to ask questions and all were answered. The patient agreed with the plan and demonstrated an understanding of the instructions.   The patient was advised to call back or seek an in-person evaluation if the symptoms worsen or if the condition fails to improve as anticipated.  I provided 20 minutes of non-face-to-face time during this encounter.   Chauncey Mann, MD  Chauncey Mann, MD

## 2018-09-14 ENCOUNTER — Other Ambulatory Visit: Payer: Self-pay | Admitting: Psychiatry

## 2018-09-14 DIAGNOSIS — F902 Attention-deficit hyperactivity disorder, combined type: Secondary | ICD-10-CM

## 2018-09-14 DIAGNOSIS — F411 Generalized anxiety disorder: Secondary | ICD-10-CM

## 2018-09-24 ENCOUNTER — Other Ambulatory Visit: Payer: Self-pay | Admitting: Psychiatry

## 2018-09-24 DIAGNOSIS — F411 Generalized anxiety disorder: Secondary | ICD-10-CM

## 2018-10-20 ENCOUNTER — Encounter (HOSPITAL_COMMUNITY): Payer: Self-pay | Admitting: Emergency Medicine

## 2018-10-20 ENCOUNTER — Emergency Department (HOSPITAL_COMMUNITY)
Admission: EM | Admit: 2018-10-20 | Discharge: 2018-10-20 | Disposition: A | Discharge status: Other Acute Inpatient Hospital | Payer: Medicaid Other | Attending: Emergency Medicine | Admitting: Emergency Medicine

## 2018-10-20 ENCOUNTER — Telehealth: Payer: Self-pay | Admitting: Physician Assistant

## 2018-10-20 ENCOUNTER — Other Ambulatory Visit: Payer: Self-pay | Admitting: Psychiatry

## 2018-10-20 ENCOUNTER — Other Ambulatory Visit: Payer: Self-pay

## 2018-10-20 DIAGNOSIS — F250 Schizoaffective disorder, bipolar type: Secondary | ICD-10-CM

## 2018-10-20 DIAGNOSIS — F17210 Nicotine dependence, cigarettes, uncomplicated: Secondary | ICD-10-CM | POA: Insufficient documentation

## 2018-10-20 DIAGNOSIS — R45851 Suicidal ideations: Secondary | ICD-10-CM | POA: Insufficient documentation

## 2018-10-20 DIAGNOSIS — Z79899 Other long term (current) drug therapy: Secondary | ICD-10-CM | POA: Insufficient documentation

## 2018-10-20 DIAGNOSIS — I10 Essential (primary) hypertension: Secondary | ICD-10-CM | POA: Insufficient documentation

## 2018-10-20 DIAGNOSIS — Z1159 Encounter for screening for other viral diseases: Secondary | ICD-10-CM | POA: Insufficient documentation

## 2018-10-20 DIAGNOSIS — F329 Major depressive disorder, single episode, unspecified: Secondary | ICD-10-CM | POA: Insufficient documentation

## 2018-10-20 LAB — CBC WITH DIFFERENTIAL/PLATELET
Abs Immature Granulocytes: 0.06 10*3/uL (ref 0.00–0.07)
Basophils Absolute: 0.2 10*3/uL — ABNORMAL HIGH (ref 0.0–0.1)
Basophils Relative: 1 %
Eosinophils Absolute: 0.5 10*3/uL (ref 0.0–0.5)
Eosinophils Relative: 4 %
HCT: 47.7 % (ref 39.0–52.0)
Hemoglobin: 16.1 g/dL (ref 13.0–17.0)
Immature Granulocytes: 1 %
Lymphocytes Relative: 24 %
Lymphs Abs: 3 10*3/uL (ref 0.7–4.0)
MCH: 29.4 pg (ref 26.0–34.0)
MCHC: 33.8 g/dL (ref 30.0–36.0)
MCV: 87.2 fL (ref 80.0–100.0)
Monocytes Absolute: 0.8 10*3/uL (ref 0.1–1.0)
Monocytes Relative: 7 %
Neutro Abs: 7.7 10*3/uL (ref 1.7–7.7)
Neutrophils Relative %: 63 %
Platelets: 221 10*3/uL (ref 150–400)
RBC: 5.47 MIL/uL (ref 4.22–5.81)
RDW: 13.7 % (ref 11.5–15.5)
WBC: 12.2 10*3/uL — ABNORMAL HIGH (ref 4.0–10.5)
nRBC: 0 % (ref 0.0–0.2)

## 2018-10-20 LAB — BASIC METABOLIC PANEL
Anion gap: 10 (ref 5–15)
BUN: 10 mg/dL (ref 6–20)
CO2: 23 mmol/L (ref 22–32)
Calcium: 9.1 mg/dL (ref 8.9–10.3)
Chloride: 103 mmol/L (ref 98–111)
Creatinine, Ser: 1.25 mg/dL — ABNORMAL HIGH (ref 0.61–1.24)
GFR calc Af Amer: >60 mL/min (ref >60)
GFR calc non Af Amer: >60 mL/min (ref >60)
Glucose, Bld: 137 mg/dL — ABNORMAL HIGH (ref 70–99)
Potassium: 4.3 mmol/L (ref 3.5–5.1)
Sodium: 136 mmol/L (ref 135–145)

## 2018-10-20 LAB — URINALYSIS, ROUTINE W REFLEX MICROSCOPIC
Bacteria, UA: NONE SEEN
Bilirubin Urine: NEGATIVE
Glucose, UA: NEGATIVE mg/dL
Ketones, ur: NEGATIVE mg/dL
Leukocytes,Ua: NEGATIVE
Nitrite: NEGATIVE
Protein, ur: NEGATIVE mg/dL
Specific Gravity, Urine: 1.006 (ref 1.005–1.030)
pH: 6 (ref 5.0–8.0)

## 2018-10-20 LAB — SARS CORONAVIRUS 2 BY RT PCR (HOSPITAL ORDER, PERFORMED IN ~~LOC~~ HOSPITAL LAB): SARS Coronavirus 2: NEGATIVE

## 2018-10-20 MED ORDER — NICOTINE 21 MG/24HR TD PT24
21.0000 mg | MEDICATED_PATCH | Freq: Once | TRANSDERMAL | Status: DC
  Administered 2018-10-20: 21 mg via TRANSDERMAL
  Filled 2018-10-20: qty 1

## 2018-10-20 NOTE — ED Notes (Signed)
Spoke with Matthew [name] at [facility] re patient coming over. She would like his labs to be resulted and then to call her back and call report to RN in [facility] before sending him over. Lab has been drawn, waiting on results. Patient aware and in agreement.

## 2018-10-20 NOTE — ED Notes (Signed)
Walked over from ED patient to room 30 for monitoring and assess needs. He is in street clothes with his property. Asked to change out into scrubs and give writer his street clothes, cell phone cigarettes and lighter. Would like a nicotine patch, he smokes a pack a day. He states he has not had his meds for two days, and is under a lot of stress but with further discussion his stress is not new stress. He is currently living with his parents. Flat affect, slow to respond. Reports command hallucinations telling his to kill himself and how to kill self. Urine cup in his room waiting for a sample. Pleasant and cooperative. Hx of schizoaffective.

## 2018-10-20 NOTE — ED Notes (Signed)
Bed: [facility]1 Expected date:  Expected time:  Means of arrival:  Comments: 

## 2018-10-20 NOTE — ED Triage Notes (Signed)
Pt reports having suicidal ideation for the last several days along with hearing voices. With voices telling pt to harm himself. Pt states that voices are telling him to shoot himself or cut his wrists. Pt does have access to firearm.

## 2018-10-20 NOTE — Telephone Encounter (Signed)
Epic documents admission time to [name] ED of [phone] with labs clear except creatinine 1.25 with upper limit of normal 1.24, and coronavirus-SARS testing is negative.  Patient reports noncompliance with medication for several days hearing voices thinking of suicide and now calling for help in the ED as instructed, then ED quoting the same and expecting [name] placement will occur.

## 2018-10-20 NOTE — ED Notes (Signed)
Discharged to [facility] OBS unit per Dr Lockie Mola and [facility] Selena Batten RN.  Report called to [facility] in OBS unit. Explained to him his disposition from here. He is in agreement with the move to [facility].  He has remained calm and cooperative. He is able to endorse his safety while in the hospital but has maintained he is having AH of a command nature telling him to kill himself and how to kill himself. [facility] transported to [facility]. All property returned to him.

## 2018-10-20 NOTE — ED Notes (Signed)
Accepted across the street to OBS at [facility] for ongoing monitoring of safety and needs. He is in agreement with admission to [facility], he states he has been there before. He is currently able to endorse his safety. Not homicidal but is psychotic, auditory hallucinations. Last hospitalization was a year ago. All property returned to him. [facility] transported.

## 2018-10-20 NOTE — ED Provider Notes (Addendum)
[facility]-EMERGENCY DEPT Provider Note   CSN: [id number] Arrival date & time: 10/20/18  1848    History   Chief Complaint Chief Complaint  Patient presents with  . Suicidal    HPI [name] is a 43 y.o. male.     The history is provided by the patient.  Mental Health Problem  Presenting symptoms: suicidal thoughts   Patient accompanied by:  Caregiver Degree of incapacity (severity):  Severe Onset quality:  Gradual Duration:  3 days Timing:  Constant Progression:  Unchanged Chronicity:  New Context: noncompliance   Treatment compliance:  Untreated Time since last psychoactive medication taken:  2 days Relieved by:  Nothing Associated symptoms: anhedonia, feelings of worthlessness and poor judgment   Associated symptoms: no abdominal pain and no chest pain   Risk factors: hx of mental illness     Past Medical History:  Diagnosis Date  . Anxiety   . Celiac disease   . Dairy allergy   . Depression   . High cholesterol   . Hypertension   . Paranoia (HCC)   . Pre-diabetes   . Schizoaffective disorder [facility])     Patient Active Problem List   Diagnosis Date Noted  . Attention deficit hyperactivity disorder (ADHD), combined type, moderate 03/10/2018  . Generalized anxiety disorder 03/10/2018  . Schizoaffective disorder, bipolar type without good prognostic features (HCC) 02/09/2015  . Morbid obesity (HCC) 01/18/2015  . OSA (obstructive sleep apnea) 07/14/2014  . Solitary pulmonary nodule 07/14/2014    Past Surgical History:  Procedure Laterality Date  . APPENDECTOMY    . BRAIN SURGERY    . NASAL SEPTUM SURGERY    . WRIST FRACTURE SURGERY     right wrist        Home Medications    Prior to Admission medications   Medication Sig Start Date End Date Taking? Authorizing Provider  amphetamine-dextroamphetamine (ADDERALL) 30 MG tablet Take 1 tablet by mouth 2 (two) times daily. 03/10/18 04/09/18  Chauncey Mann, MD   amphetamine-dextroamphetamine (ADDERALL) 30 MG tablet Take 1 tablet by mouth 2 (two) times daily for 30 days. 09/02/18 10/02/18  Chauncey Mann, MD  amphetamine-dextroamphetamine (ADDERALL) 30 MG tablet Take 1 tablet by mouth 2 (two) times daily for 30 days. 10/02/18 11/01/18  Chauncey Mann, MD  amphetamine-dextroamphetamine (ADDERALL) 30 MG tablet Take 1 tablet by mouth 2 (two) times daily for 30 days. 11/01/18 12/01/18  Chauncey Mann, MD  atenolol (TENORMIN) 100 MG tablet Take 1 tablet (100 mg total) by mouth daily after breakfast. 09/02/18   Chauncey Mann, MD  clonazePAM (KLONOPIN) 1 MG tablet Take 1 tablet (1 mg total) by mouth 3 (three) times daily as needed for anxiety. 09/02/18   Chauncey Mann, MD  DULoxetine (CYMBALTA) 60 MG capsule TAKE 1 CAPSULE BY MOUTH TWICE A DAY FOR DEPRESSION 09/15/18   Chauncey Mann, MD  fluticasone [facility]) 50 MCG/ACT nasal spray Place 1 spray into both nostrils as needed for allergies or rhinitis. 02/11/15   Armandina Stammer I, NP  gabapentin (NEURONTIN) 600 MG tablet TAKE 1 TABLET BY MOUTH THREE TIMES A DAY 09/25/18   Chauncey Mann, MD  lithium carbonate (LITHOBID) 300 MG CR tablet Take 2 tablets (600 mg total) by mouth at bedtime. 09/02/18   Chauncey Mann, MD  nicotine polacrilex (NICORETTE) 2 MG gum Take 1 each (2 mg total) by mouth as needed for smoking cessation. 02/11/15   Sanjuana Kava, NP  oxyCODONE (  OXY IR/ROXICODONE) 5 MG immediate release tablet Take 1 tablet (5 mg total) by mouth every 8 (eight) hours as needed for moderate pain or severe pain. 02/11/15   Armandina Stammer I, NP  perphenazine (TRILAFON) 16 MG tablet Take 2 tablets (32 mg total) by mouth at bedtime. 09/02/18   Chauncey Mann, MD  tiZANidine (ZANAFLEX) 4 MG capsule Take 1 capsule (4 mg total) by mouth 3 (three) times daily as needed (headache). 02/11/15   Armandina Stammer I, NP  traZODone (DESYREL) 100 MG tablet Take 1 tablet (100 mg total) by mouth at bedtime as needed for sleep.  09/02/18   Chauncey Mann, MD    Family History Family History  Problem Relation Age of Onset  . Asthma Mother   . Hypertension Mother   . Hypertension Father     Social History Social History   Tobacco Use  . Smoking status: Current Every Day Smoker    Packs/day: 0.25    Types: Cigarettes  . Smokeless tobacco: Never Used  Substance Use Topics  . Alcohol use: No    Alcohol/week: 0.0 standard drinks    Comment: no (02/08/15)  . Drug use: No     Allergies   Gluten meal   Review of Systems Review of Systems  Constitutional: Negative for chills and fever.  HENT: Negative for ear pain and sore throat.   Eyes: Negative for pain and visual disturbance.  Respiratory: Negative for cough and shortness of breath.   Cardiovascular: Negative for chest pain and palpitations.  Gastrointestinal: Negative for abdominal pain and vomiting.  Genitourinary: Negative for dysuria and hematuria.  Musculoskeletal: Negative for arthralgias and back pain.  Skin: Negative for color change and rash.  Neurological: Negative for seizures and syncope.  Psychiatric/Behavioral: Positive for suicidal ideas.  All other systems reviewed and are negative.    Physical Exam Updated Vital Signs BP (!) 152/107 (BP Location: Left Arm)   Pulse 95   Temp 98.9 F (37.2 C) (Oral)   Resp 17   Ht  (1.905 m)   Wt 129.7 kg   SpO2 99%   BMI 35.75 kg/m   Physical Exam Vitals signs and nursing note reviewed.  Constitutional:      Appearance: He is well-developed.  HENT:     Head: Normocephalic and atraumatic.  Eyes:     Conjunctiva/sclera: Conjunctivae normal.  Neck:     Musculoskeletal: Neck supple.  Cardiovascular:     Rate and Rhythm: Normal rate and regular rhythm.     Heart sounds: No murmur.  Pulmonary:     Effort: Pulmonary effort is normal. No respiratory distress.     Breath sounds: Normal breath sounds.  Abdominal:     Palpations: Abdomen is soft.     Tenderness: There is no  abdominal tenderness.  Skin:    General: Skin is warm and dry.  Neurological:     Mental Status: He is alert.  Psychiatric:        Attention and Perception: Attention normal.        Mood and Affect: Mood is depressed.        Behavior: Behavior normal.        Thought Content: Thought content includes suicidal ideation. Thought content does not include homicidal ideation. Thought content includes suicidal plan. Thought content does not include homicidal plan.        Judgment: Judgment is impulsive.      ED Treatments / Results  Labs (all labs ordered are  listed, but only abnormal results are displayed) Labs Reviewed  SARS CORONAVIRUS 2 (HOSPITAL ORDER, [facility]FOR[facility])    EKG None  Radiology No results found.  Procedures Procedures (including critical care time)  Medications Ordered in ED Medications - No data to display   Initial Impression / Assessment and Plan / ED Course  I have reviewed the triage vital signs and the nursing notes.  Pertinent labs & imaging results that were available during my care of the patient were reviewed by me and considered in my medical decision making (see chart for details).     Matthew [name] J Farooq is a 43 year old male who presents to the ED with suicidal ideation.  Patient with normal vitals.  No fever.  Patient with history of schizoaffective disorder.  Has had increasing suicidal thoughts over the last several days, hearing voices.  Has not been compliant with his medication for the last 2 days.  Called mental health care provider on the phone who recommend that he emergently go to the emergency department.  He did admit that he has access to gun which shoot himself.  Patient denies any alcohol or drugs today.  He is overall well-appearing.  It appears that he would need admission for psychiatric stabilization.  Therefore coronavirus testing per protocol was ordered. [facility] to be called for transfer over there for evaluation.   Patient medically cleared at this time.  [facility] Here voluntarily.  This chart was dictated using voice recognition software.  Despite best efforts to proofread,  errors can occur which can change the documentation meaning.    Final Clinical Impressions(s) / ED Diagnoses   Final diagnoses:  Suicidal ideation    ED Discharge Orders    None       Virgina NorfolkCuratolo, Zeb Rawl, DO 10/20/18 2012    Virgina Norfolkuratolo, Tiphany Fayson, DO 10/20/18 2048    Virgina Norfolkuratolo, Luvinia Lucy, DO 10/20/18 2048

## 2018-10-21 ENCOUNTER — Encounter (HOSPITAL_COMMUNITY): Payer: Self-pay

## 2018-10-21 ENCOUNTER — Inpatient Hospital Stay (HOSPITAL_COMMUNITY)
Admission: RE | Admit: 2018-10-21 | Discharge: 2018-10-23 | DRG: 885 | Disposition: A | Discharge status: Home / Self Care | Payer: Medicaid Other | Source: Intra-hospital | Attending: Psychiatry | Admitting: Psychiatry

## 2018-10-21 DIAGNOSIS — F17210 Nicotine dependence, cigarettes, uncomplicated: Secondary | ICD-10-CM | POA: Diagnosis present

## 2018-10-21 DIAGNOSIS — Z825 Family history of asthma and other chronic lower respiratory diseases: Secondary | ICD-10-CM

## 2018-10-21 DIAGNOSIS — Z79899 Other long term (current) drug therapy: Secondary | ICD-10-CM | POA: Diagnosis not present

## 2018-10-21 DIAGNOSIS — Z8249 Family history of ischemic heart disease and other diseases of the circulatory system: Secondary | ICD-10-CM | POA: Diagnosis not present

## 2018-10-21 DIAGNOSIS — F411 Generalized anxiety disorder: Secondary | ICD-10-CM | POA: Diagnosis present

## 2018-10-21 DIAGNOSIS — I10 Essential (primary) hypertension: Secondary | ICD-10-CM | POA: Diagnosis present

## 2018-10-21 DIAGNOSIS — E739 Lactose intolerance, unspecified: Secondary | ICD-10-CM | POA: Diagnosis present

## 2018-10-21 DIAGNOSIS — R7303 Prediabetes: Secondary | ICD-10-CM | POA: Diagnosis present

## 2018-10-21 DIAGNOSIS — Z885 Allergy status to narcotic agent status: Secondary | ICD-10-CM

## 2018-10-21 DIAGNOSIS — R45851 Suicidal ideations: Secondary | ICD-10-CM | POA: Diagnosis present

## 2018-10-21 DIAGNOSIS — F250 Schizoaffective disorder, bipolar type: Secondary | ICD-10-CM

## 2018-10-21 DIAGNOSIS — J301 Allergic rhinitis due to pollen: Secondary | ICD-10-CM | POA: Diagnosis present

## 2018-10-21 DIAGNOSIS — E7800 Pure hypercholesterolemia, unspecified: Secondary | ICD-10-CM | POA: Diagnosis present

## 2018-10-21 DIAGNOSIS — K900 Celiac disease: Secondary | ICD-10-CM | POA: Diagnosis present

## 2018-10-21 DIAGNOSIS — F251 Schizoaffective disorder, depressive type: Principal | ICD-10-CM | POA: Diagnosis present

## 2018-10-21 LAB — HEPATIC FUNCTION PANEL
ALT: 46 U/L — ABNORMAL HIGH (ref 0–44)
AST: 24 U/L (ref 15–41)
Albumin: 4.3 g/dL (ref 3.5–5.0)
Alkaline Phosphatase: 75 U/L (ref 38–126)
Bilirubin, Direct: <0.1 mg/dL (ref 0.0–0.2)
Total Bilirubin: 0.4 mg/dL (ref 0.3–1.2)
Total Protein: 7.2 g/dL (ref 6.5–8.1)

## 2018-10-21 LAB — HEMOGLOBIN A1C
Hgb A1c MFr Bld: 6.4 % — ABNORMAL HIGH (ref 4.8–5.6)
Mean Plasma Glucose: 136.98 mg/dL

## 2018-10-21 LAB — LIPID PANEL
Cholesterol: 278 mg/dL — ABNORMAL HIGH (ref 0–200)
HDL: 28 mg/dL — ABNORMAL LOW (ref >40)
LDL Cholesterol: UNDETERMINED mg/dL (ref 0–99)
Total CHOL/HDL Ratio: 9.9 RATIO
Triglycerides: 571 mg/dL — ABNORMAL HIGH (ref <150)
VLDL: UNDETERMINED mg/dL (ref 0–40)

## 2018-10-21 LAB — TSH: TSH: 7.418 u[IU]/mL — ABNORMAL HIGH (ref 0.350–4.500)

## 2018-10-21 LAB — LITHIUM LEVEL: Lithium Lvl: 0.67 mmol/L (ref 0.60–1.20)

## 2018-10-21 LAB — LDL CHOLESTEROL, DIRECT: Direct LDL: 127.3 mg/dL — ABNORMAL HIGH (ref 0–99)

## 2018-10-21 MED ORDER — GABAPENTIN 300 MG PO CAPS
300.0000 mg | ORAL_CAPSULE | Freq: Three times a day (TID) | ORAL | Status: DC
  Administered 2018-10-21 – 2018-10-23 (×6): 300 mg via ORAL
  Filled 2018-10-21 (×11): qty 1

## 2018-10-21 MED ORDER — CLONAZEPAM 1 MG PO TABS
1.0000 mg | ORAL_TABLET | Freq: Three times a day (TID) | ORAL | Status: DC | PRN
  Administered 2018-10-21 (×2): 1 mg via ORAL
  Filled 2018-10-21 (×2): qty 1

## 2018-10-21 MED ORDER — ACETAMINOPHEN 325 MG PO TABS: 650.0000 mg | ORAL_TABLET | Freq: Four times a day (QID) | ORAL | Status: DC | PRN

## 2018-10-21 MED ORDER — ALUM & MAG HYDROXIDE-SIMETH 200-200-20 MG/5ML PO SUSP: 30.0000 mL | ORAL | Status: DC | PRN

## 2018-10-21 MED ORDER — LITHIUM CARBONATE ER 300 MG PO TBCR
600.0000 mg | EXTENDED_RELEASE_TABLET | Freq: Every day | ORAL | Status: DC
  Administered 2018-10-21 – 2018-10-22 (×3): 600 mg via ORAL
  Filled 2018-10-21 (×6): qty 2

## 2018-10-21 MED ORDER — PERPHENAZINE 4 MG PO TABS
32.0000 mg | ORAL_TABLET | Freq: Every day | ORAL | Status: DC
  Administered 2018-10-21: 32 mg via ORAL
  Filled 2018-10-21 (×4): qty 8

## 2018-10-21 MED ORDER — ENSURE ENLIVE PO LIQD
237.0000 mL | Freq: Two times a day (BID) | ORAL | Status: DC
  Administered 2018-10-21 – 2018-10-22 (×2): 237 mL via ORAL

## 2018-10-21 MED ORDER — PERPHENAZINE 16 MG PO TABS
16.0000 mg | ORAL_TABLET | Freq: Two times a day (BID) | ORAL | Status: DC
  Administered 2018-10-21 – 2018-10-23 (×4): 16 mg via ORAL
  Filled 2018-10-21 (×8): qty 1

## 2018-10-21 MED ORDER — BENZTROPINE MESYLATE 1 MG PO TABS
1.0000 mg | ORAL_TABLET | Freq: Two times a day (BID) | ORAL | Status: DC
  Administered 2018-10-21 – 2018-10-23 (×4): 1 mg via ORAL
  Filled 2018-10-21 (×8): qty 1

## 2018-10-21 MED ORDER — DULOXETINE HCL 60 MG PO CPEP
60.0000 mg | ORAL_CAPSULE | Freq: Two times a day (BID) | ORAL | Status: DC
  Administered 2018-10-21 – 2018-10-23 (×5): 60 mg via ORAL
  Filled 2018-10-21 (×10): qty 1

## 2018-10-21 MED ORDER — TRAZODONE HCL 100 MG PO TABS
100.0000 mg | ORAL_TABLET | Freq: Every evening | ORAL | Status: DC | PRN
  Administered 2018-10-22: 100 mg via ORAL
  Filled 2018-10-21: qty 1

## 2018-10-21 MED ORDER — CLONAZEPAM 0.5 MG PO TABS
0.5000 mg | ORAL_TABLET | Freq: Three times a day (TID) | ORAL | Status: DC | PRN
  Administered 2018-10-23: 0.5 mg via ORAL
  Filled 2018-10-21: qty 1

## 2018-10-21 MED ORDER — HYDROXYZINE HCL 25 MG PO TABS
25.0000 mg | ORAL_TABLET | Freq: Three times a day (TID) | ORAL | Status: DC | PRN
  Administered 2018-10-22 (×2): 25 mg via ORAL
  Filled 2018-10-21 (×2): qty 1

## 2018-10-21 MED ORDER — MAGNESIUM HYDROXIDE 400 MG/5ML PO SUSP: 30.0000 mL | Freq: Every day | ORAL | Status: DC | PRN

## 2018-10-21 MED ORDER — TEMAZEPAM 30 MG PO CAPS
30.0000 mg | ORAL_CAPSULE | Freq: Every day | ORAL | Status: DC
  Administered 2018-10-21: 21:00:00 30 mg via ORAL
  Filled 2018-10-21: qty 1

## 2018-10-21 MED ORDER — ATENOLOL 50 MG PO TABS
100.0000 mg | ORAL_TABLET | Freq: Every day | ORAL | Status: DC
  Administered 2018-10-21 – 2018-10-23 (×3): 100 mg via ORAL
  Filled 2018-10-21 (×5): qty 2

## 2018-10-21 MED ORDER — NICOTINE 21 MG/24HR TD PT24
21.0000 mg | MEDICATED_PATCH | Freq: Every day | TRANSDERMAL | Status: DC
  Administered 2018-10-21 – 2018-10-23 (×3): 21 mg via TRANSDERMAL
  Filled 2018-10-21 (×6): qty 1

## 2018-10-21 NOTE — [facility] Suicide Risk Assessment (Signed)
[facility] Admission Suicide Risk Assessment   Nursing information obtained from:  Patient Demographic factors:  Male, Caucasian, Unemployed Current Mental Status:  Suicidal ideation indicated by patient, Suicide plan Loss Factors:  Financial problems / change in socioeconomic status Historical Factors:  NA Risk Reduction Factors:  Living with another person, especially a relative  Total Time spent with patient: 45 minutes Principal Problem: Reportedly exacerbation and underlying schizoaffective condition involving auditory hallucinations and thoughts of self-harm/chemical dependency is iatrogenic Diagnosis:  Active Problems:   Schizoaffective disorder, depressive type (HCC)  Subjective Data: Patient reporting voices inside his head telling him to harm self Continued Clinical Symptoms:  Alcohol Use Disorder Identification Test Final Score (AUDIT): 0 The "Alcohol Use Disorders Identification Test", Guidelines for Use in Primary Care, Second Edition.  World Science writer [facility]). Score between 0-7:  no or low risk or alcohol related problems. Score between 8-15:  moderate risk of alcohol related problems. Score between 16-19:  high risk of alcohol related problems. Score 20 or above:  warrants further diagnostic evaluation for alcohol dependence and treatment.   CLINICAL FACTORS:   Bipolar Disorder:   Mixed State   Musculoskeletal: Strength & Muscle Tone: within normal limits Gait & Station: normal Patient leans: N/A  Psychiatric Specialty Exam: Physical Exam  ROS  Blood pressure 124/86, pulse (!) 101, temperature 97.8 F (36.6 C), temperature source Oral, resp. rate 20, height 6\' 2"  (1.88 m), weight 119.7 kg.Body mass index is 33.9 kg/m.  General Appearance: Disheveled  Eye Contact:  Minimal  Speech:  Slow  Volume:  Decreased  Mood:  Depressed  Affect:  Flat  Thought Process:  Goal Directed and Descriptions of Associations: Circumstantial  Orientation:  Full (Time, Place,  and Person)  Thought Content:  Obsessions  Suicidal Thoughts:  Yes-contracts here no plans or intent  Homicidal Thoughts:  No  Memory:  Immediate;   Fair  Judgement:  Fair  Insight:  Fair  Psychomotor Activity:  Normal  Concentration:  Concentration: Fair  Recall:  [employer] of Knowledge:  Fair  Language:  Fair  Akathisia:  Negative  Handed:  Right  AIMS (if indicated):     Assets:  Leisure Time Physical Health Resilience Social Support  ADL's:  Intact  Cognition:  WNL  Sleep:  Number of Hours: 2.75      COGNITIVE FEATURES THAT CONTRIBUTE TO RISK:  Loss of executive function    SUICIDE RISK: Moderate but can contract PLAN OF CARE: Meds adjusted  I certify that inpatient services furnished can reasonably be expected to improve the patient's condition.   Malvin Johns, MD 10/21/2018, 10:49 AM

## 2018-10-21 NOTE — BH Assessment (Addendum)
Assessment Note  Matthew [name] is a 43 y.o. male who came to [facility] after visiting [facility] due to experiencing AH that are telling him to kill himself by shooting himself with a gun, which he has in the home that he lives in with his parents. Pt shares he has been experiencing hearing these voices for the past couple of days and that he has been feeling down for the week prior. Pt told his EDP (but did not tell this clinician) that he has not taken his medication in two days, though it is not documented as to why pt did not take his medication.  Pt shared he believes his medication is working well, though he expressed thinking it is not working as well as it was prior to being switched to a different medication several months ago due to insurance costs. Pt states he used to take [name] and that, while his new medication is working ok, it is not working as well as [name] was.  Pt expressed experiencing having "down" feelings because he's not where he'd like to be in life. 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  Pt denies HI, VH, NSSIB, involvement in the legal system, or SA. Pt acknowledges SI and AH.  Pt declined to have clinician contact his parents and stated he has "nobody" for support that clinician can call for collateral.  Pt is oriented x4. His recent and remote memory is intact. Pt was cooperative and pleasant throughout the assessment process. Pt's insight, judgement, and impulse control is impaired at this time.   Diagnosis: F25.1, Schizoaffective disorder, Depressive type   Past Medical History:  Past Medical History:  Diagnosis Date  . Anxiety   . Celiac disease   . Dairy allergy   . Depression   . High cholesterol   . Hypertension   . Paranoia  ([facility])   . Pre-diabetes   . Schizoaffective disorder [facility])     Past Surgical History:  Procedure Laterality Date  . APPENDECTOMY    . BRAIN SURGERY    . NASAL SEPTUM SURGERY    . WRIST FRACTURE SURGERY     right wrist    Family History:  Family History  Problem Relation Age of Onset  . Asthma Mother   . Hypertension Mother   . Hypertension Father     Social History:  reports that he has been smoking cigarettes. He has been smoking about 0.25 packs per day. He has never used smokeless tobacco. He reports that he does not drink alcohol or use drugs.  Additional Social History:  Alcohol / Drug Use Pain Medications: Please see MAR Prescriptions: Please see MAR Over the Counter: Please see MAR History of alcohol / drug use?: No history of alcohol / drug abuse Longest period of sobriety (when/how long): Pt denies SA  CIWA: CIWA-Ar BP: (!) 135/98 Pulse Rate: 89 COWS:    Allergies:  Allergies  Allergen Reactions  . Gluten Meal Other (See Comments)    Celiac disease    Home Medications:  Medications Prior to Admission  Medication Sig Dispense Refill  . amphetamine-dextroamphetamine (ADDERALL) 30 MG tablet Take 1 tablet by mouth 2 (two) times daily. 60 tablet 0  . amphetamine-dextroamphetamine (ADDERALL) 30  MG tablet Take 1 tablet by mouth 2 (two) times daily for 30 days. 60 tablet 0  . amphetamine-dextroamphetamine (ADDERALL) 30 MG tablet Take 1 tablet by mouth 2 (two) times daily for 30 days. 60 tablet 0  . [START ON 11/01/2018] amphetamine-dextroamphetamine (ADDERALL) 30 MG tablet Take 1 tablet by mouth 2 (two) times daily for 30 days. 60 tablet 0  . atenolol (TENORMIN) 100 MG tablet Take 1 tablet (100 mg total) by mouth daily after breakfast. 30 tablet 2  . clonazePAM (KLONOPIN) 1 MG tablet Take 1 tablet (1 mg total) by mouth 3 (three) times daily as needed for anxiety. 90 tablet 2  . DULoxetine (CYMBALTA) 60 MG capsule TAKE 1 CAPSULE BY MOUTH TWICE A DAY FOR DEPRESSION  180 capsule 0  . fluticasone (FLONASE) 50 MCG/ACT nasal spray Place 1 spray into both nostrils as needed for allergies or rhinitis.  2  . gabapentin (NEURONTIN) 600 MG tablet TAKE 1 TABLET BY MOUTH THREE TIMES A DAY 270 tablet 0  . lithium carbonate (LITHOBID) 300 MG CR tablet Take 2 tablets (600 mg total) by mouth at bedtime. 60 tablet 2  . nicotine polacrilex (NICORETTE) 2 MG gum Take 1 each (2 mg total) by mouth as needed for smoking cessation. 100 tablet 0  . oxyCODONE (OXY IR/ROXICODONE) 5 MG immediate release tablet Take 1 tablet (5 mg total) by mouth every 8 (eight) hours as needed for moderate pain or severe pain. 6 tablet 0  . perphenazine (TRILAFON) 16 MG tablet Take 2 tablets (32 mg total) by mouth at bedtime. 60 tablet 2  . tiZANidine (ZANAFLEX) 4 MG capsule Take 1 capsule (4 mg total) by mouth 3 (three) times daily as needed (headache). 20 capsule 0  . traZODone (DESYREL) 100 MG tablet Take 1 tablet (100 mg total) by mouth at bedtime as needed for sleep. 30 tablet 2    OB/GYN Status:  No LMP for male patient.  General Assessment Data TTS Assessment: In system Is this a Tele or Face-to-Face Assessment?: Face-to-Face Is this an Initial Assessment or a Re-assessment for this encounter?: Initial Assessment Patient Accompanied by:: N/A Language Other than English: No Living Arrangements: Other (Comment)(Pt lives with his parents) What gender do you identify as?: Male Marital status: Single Maiden name: Gatt Pregnancy Status: No Living Arrangements: Parent Can pt return to current living arrangement?: Yes Admission Status: Voluntary Is patient capable of signing voluntary admission?: Yes Referral Source: Self/Family/Friend Insurance type: [facility] Medicaid  Medical Screening Exam ([facility]) Medical Exam completed: Yes  Crisis Care Plan Living Arrangements: Parent Legal Guardian: Other:(N/A) Name of Psychiatrist: Milana Huntsman - [facility], has been seeing for  several years Name of Therapist: None  Education Status Is patient currently in school?: No Is the patient employed, unemployed or receiving disability?: Unemployed(Is attempting to get disability for mental health)  Risk to self with the past 6 months Suicidal Ideation: Yes-Currently Present Has patient been a risk to self within the past 6 months prior to admission? : No Suicidal Intent: Yes-Currently Present Has patient had any suicidal intent within the past 6 months prior to admission? : No Is patient at risk for suicide?: Yes Suicidal Plan?: Yes-Currently Present Has patient had any suicidal plan within the past 6 months prior to admission? : No Specify Current Suicidal Plan: Pt plans to shoot himself Access to Means: Yes Specify Access to Suicidal Means: Pt has guns in the home What has been your use of drugs/alcohol within the last 12 months?:  Pt denies SA Previous Attempts/Gestures: No How many times?: 0 Other Self Harm Risks: Pt has not taken his medication in several days Triggers for Past Attempts: Hallucinations Intentional Self Injurious Behavior: None Family Suicide History: Yes(Pt's brother has a history of SI) Recent stressful life event(s): Other (Comment)(No job, no car, lives with his parents; "nothing going on.") Persecutory voices/beliefs?: Yes Depression: Yes Depression Symptoms: Despondent, Fatigue, Loss of interest in usual pleasures, Feeling worthless/self pity, Insomnia Substance abuse history and/or treatment for substance abuse?: No Suicide prevention information given to non-admitted patients: Not applicable  Risk to Others within the past 6 months Homicidal Ideation: No Does patient have any lifetime risk of violence toward others beyond the six months prior to admission? : No Thoughts of Harm to Others: No Current Homicidal Intent: No Current Homicidal Plan: No Access to Homicidal Means: No Identified Victim: None noted History of harm to  others?: No Assessment of Violence: On admission Violent Behavior Description: None noted Does patient have access to weapons?: Yes (Comment)(Pt states he has access to guns and knives at home) Criminal Charges Pending?: No Does patient have a court date: No Is patient on probation?: No  Psychosis Hallucinations: Auditory Delusions: None noted  Mental Status Report Appearance/Hygiene: In scrubs Eye Contact: Fair Motor Activity: Freedom of movement, Unremarkable(Pt is sitting up in his bed) Speech: Logical/coherent Level of Consciousness: Alert Mood: Depressed, Worthless, low self-esteem Affect: Appropriate to circumstance Anxiety Level: Minimal Thought Processes: Coherent, Relevant Judgement: Partial Orientation: Person, Place, Time, Situation Obsessive Compulsive Thoughts/Behaviors: Minimal  Cognitive Functioning Concentration: Fair Memory: Recent Intact, Remote Intact Is patient IDD: No Insight: Fair Impulse Control: Fair Appetite: Good Have you had any weight changes? : No Change Sleep: Decreased Total Hours of Sleep: 4(Pt states he has "had a rough past couple of days") Vegetative Symptoms: None  ADLScreening [facility] Assessment Services) Patient's cognitive ability adequate to safely complete daily activities?: Yes Patient able to express need for assistance with ADLs?: Yes Independently performs ADLs?: Yes (appropriate for developmental age)  Prior Inpatient Therapy Prior Inpatient Therapy: Yes Prior Therapy Dates: Marena Chancy, though states most recent was in 2016 Prior Therapy Facilty/Provider(s): [facility] Reason for Treatment: Schizo-affective disorder  Prior Outpatient Therapy Prior Outpatient Therapy: Yes Prior Therapy Dates: Unsure Prior Therapy Facilty/Provider(s): [facility] - private practice Reason for Treatment: Schizo-Affective Disorder Does patient have an ACCT team?: No Does patient have Intensive In-House Services?  : No Does patient have  [facility] services? : No Does patient have P4CC services?: No  ADL Screening (condition at time of admission) Patient's cognitive ability adequate to safely complete daily activities?: Yes Is the patient deaf or have difficulty hearing?: No Does the patient have difficulty seeing, even when wearing glasses/contacts?: No Does the patient have difficulty concentrating, remembering, or making decisions?: No Patient able to express need for assistance with ADLs?: Yes Does the patient have difficulty dressing or bathing?: No Independently performs ADLs?: Yes (appropriate for developmental age) Does the patient have difficulty walking or climbing stairs?: No Weakness of Legs: None Weakness of Arms/Hands: None  Home Assistive Devices/Equipment Home Assistive Devices/Equipment: None  Therapy Consults (therapy consults require a physician order) PT Evaluation Needed: No OT Evalulation Needed: No SLP Evaluation Needed: No Abuse/Neglect Assessment (Assessment to be complete while patient is alone) Abuse/Neglect Assessment Can Be Completed: Yes Physical Abuse: Denies Verbal Abuse: Denies Sexual Abuse: Denies Exploitation of patient/patient's resources: Denies Self-Neglect: Denies Values / Beliefs Cultural Requests During Hospitalization: None Spiritual Requests During Hospitalization: None Consults  Spiritual Care Consult Needed: No Social Work Consult Needed: No Regulatory affairs officer (For Healthcare) Does Patient Have a Medical Advance Directive?: Unable to assess, patient is non-responsive or altered mental status        Disposition: Lindon Romp, NP, reviewed pt's chart and information and met with pt and determined pt meets inpatient criteria. Pt has been accepted at [facility] into Room 507-1.   Disposition Initial Assessment Completed for this Encounter: Yes Disposition of Patient: Admit(Jason Gwenlyn Found, NP, determined pt meets inpatient criteria) Type of inpatient treatment  program: Adult Patient refused recommended treatment: No Mode of transportation if patient is discharged/movement?: N/A Patient referred to: (Pt was accepted at [facility] Room 507-1)  On Site Evaluation by:   Reviewed with Physician:    Dannielle Burn 10/21/2018 1:41 AM

## 2018-10-21 NOTE — Telephone Encounter (Signed)
Looks like pt is in hospital

## 2018-10-21 NOTE — Tx Team (Signed)
Initial Treatment Plan 10/21/2018 3:57 AM [name] NID:[id number]    PATIENT STRESSORS: Financial difficulties   PATIENT STRENGTHS: Average or above average intelligence Communication skills Motivation for treatment/growth Supportive family/friends Work skills   PATIENT IDENTIFIED PROBLEMS: "I want to work on better communications with people"  "I don't have a job or a car"  "I don't think my medication is working well because I'm having hallucinations"                 DISCHARGE CRITERIA:  Improved stabilization in mood, thinking, and/or behavior Need for constant or close observation no longer present Reduction of life-threatening or endangering symptoms to within safe limits  PRELIMINARY DISCHARGE PLAN: Return to previous living arrangement  PATIENT/FAMILY INVOLVEMENT: This treatment plan has been presented to and reviewed with the patient, TIRELL SHRUM.  The patient and family have been given the opportunity to ask questions and make suggestions.  Edwyna Perfect, RN 10/21/2018, 3:57 AM

## 2018-10-21 NOTE — Progress Notes (Signed)
Admission note:  Patient admitted to room 507-1 from [facility] because of complaints of AH telling him to hurt himself and SI.   During admission assesment by this RN Rayce denies current SI, HI, AVH, and contracts for safety. He reports he is feeling better in the hospital but was experiencing AH and SI. He reports anxiety, depression, increased sleep, hopelessness, helplessness, and irritability. He reports celiac disease, HTN, history of heart block, history of TMJ, and allergy to trazodone. Patient reports he takes duloxetine, adderall, klonopin, trilafon, atenolol, lithium, and gabapentin at home. Pt told his EDP (but not TTS nor this RN) that he has not taken his medication in two days, though it is not documented as to why pt did not take his medication. Patient reports smoking a pack of cigarettes per day and would like nicotine patch while here. Patient denies alcohol, drugs, and prescription medication abuse. Patient denies any history of physical, sexual, or verbal abuse. He denies aggressive behavior. Patient reports losing 15 pounds without trying. He reports he doesn't have a support system. He lives with his parents. He has a degree in radiologic sciences. 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 His stressors are "I don't have a job and I don't have a car." He wants to work on "better communications with people" and when asked if he thinks his medication is working for him he states "no because I'm having hallucinations". Patient is a low fall risk. He declines the pneumonia vaccine. Skin assessment is unremarkable.  Patient is oriented to the unit and his rights and responsibilities were explained to him. Food and drink offered. He accepted a drink and medications were administered per provider order.

## 2018-10-21 NOTE — Progress Notes (Signed)
Recreation Therapy Notes  INPATIENT RECREATION THERAPY ASSESSMENT  Patient Details Name: Matthew [name] MRN: [id number] DOB: 12-15-1975 Today's Date: 10/21/2018       Information Obtained From: Patient  Able to Participate in Assessment/Interview: Yes  Patient Presentation: Alert  Reason for Admission (Per Patient): Suicidal Ideation  Patient Stressors: Other (Comment)(Not having a car or a job)  Pharmacologist:   Film/video editor, TV, Music, Impulsivity, Talk, Prayer, Avoidance  Leisure Interests (2+):  Exercise - Walking, Individual - Other (Comment)(Watch movies)  Frequency of Recreation/Participation: Other (Comment)(Daily)  Awareness of Community Resources:  Yes  Community Resources:  Library, Other (Comment)(Pool)  Current Use: Yes  If no, Barriers?:    Expressed Interest in [employer] Information: No  County of Residence:  [place]  Patient Main Form of Transportation: Walk  Patient Strengths:  "I don't know"  Patient Identified Areas of Improvement:  Working out; Get a job  Patient Goal for Hospitalization:  "try to get better"  Current SI (including self-harm):  No  Current HI:  No  Current AVH: No  Staff Intervention Plan: Group Attendance, Collaborate with Interdisciplinary Treatment Team  Consent to Intern Participation: N/A    Caroll Rancher, LRT/CTRS  Caroll Rancher A 10/21/2018, 12:22 PM

## 2018-10-21 NOTE — Progress Notes (Signed)
NUTRITION ASSESSMENT  Pt identified as at risk on the Malnutrition Screen Tool  INTERVENTION: 1. Supplements: Continue Ensure Enlive po BID, each supplement provides 350 kcal and 20 grams of protein  NUTRITION DIAGNOSIS: Unintentional weight loss related to sub-optimal intake as evidenced by pt report.   Goal: Pt to meet >/= 90% of their estimated nutrition needs.  Monitor:  PO intake  Assessment:  Pt admitted for actively hallucinating and SI. Pt reports good appetite today. Pt reports 15 lbs of weight loss that was unintentional. Per weight records, pt has lost 18 lbs since 1/14 (6% wt loss x 4.5 months, insignificant for time frame). Will continue Ensure supplements.  Height: Ht Readings from Last 1 Encounters:  10/21/18 6\' 2"  (1.88 m)    Weight: Wt Readings from Last 1 Encounters:  10/21/18 119.7 kg    Weight Hx: Wt Readings from Last 10 Encounters:  10/21/18 119.7 kg  10/20/18 129.7 kg  06/03/18 127.9 kg  03/10/18 122.5 kg  02/08/15 119.7 kg  01/18/15 113.9 kg  11/18/14 102.5 kg  09/25/14 103.4 kg  09/17/14 104.3 kg  07/14/14 116.4 kg    BMI:  Body mass index is 33.9 kg/m. Pt meets criteria for obesity based on current BMI.  Estimated Nutritional Needs: Kcal: 25-30 kcal/kg Protein: > 1 gram protein/kg Fluid: 1 ml/kcal  Diet Order:  Diet Order            Diet regular Room service appropriate? Yes; Fluid consistency: Thin  Diet effective now             Pt is also offered choice of unit snacks mid-morning and mid-afternoon.  Pt is eating as desired.   Lab results and medications reviewed.   Tilda Franco, MS, RD, LDN [facility] Inpatient Clinical Dietitian Pager: [phone] After Hours Pager: [phone]

## 2018-10-21 NOTE — Telephone Encounter (Signed)
Thanks for the update

## 2018-10-21 NOTE — H&P (Signed)
Psychiatric Admission Assessment Adult  Patient Identification: Matthew [name] MRN:  [id number] Date of Evaluation:  10/21/2018 Chief Complaint:  Schizoaffective Principal Diagnosis: Exacerbation of schizoaffective condition reporting suicidal thoughts Diagnosis:  Active Problems:   Schizoaffective disorder, depressive type (HCC)  History of Present Illness:   This is the latest of several psychiatric admissions but the first in September 2016 for this 43 year old individual has a history of a schizoaffective type condition, has a history of long-term stimulant usage, opiate usage and benzodiazepine dependence.  Patient presented reporting auditory hallucinations telling him to harm himself however he reports the voices are strictly "inside" not outside his head so perhaps he is referring more to internal ruminations or intrusive thoughts at any rate he was unable to contract fully and is admitted for stabilization.  Notes indicate general compliance and also recent switch from [name]/brexpiprazole to perphenazine which she feels is not as successful but cannot afford the brexpiprazole  Controlled substance databank reveals long-term prescriptions for Adderall, 60 mg a day, clonazepam 1 mg 3 times daily, and opiates as well.  Patient is currently in bed alert and oriented to person place time situation not exact date reporting internal voices telling him to harm himself intermittently without plans to harm himself here and can contract and understands what that means.  Denies visual hallucinations. Associated Signs/Symptoms: Depression Symptoms:  psychomotor retardation, (Hypo) Manic Symptoms:  Hallucinations, Anxiety Symptoms:  not at present Psychotic Symptoms:  Hallucinations: Auditory PTSD Symptoms: NA Total Time spent with patient: 45 minutes  Past Psychiatric History: Last admission was 2016 at that point treated with no antipsychotic  Is the patient at risk to self? Yes.     Has the patient been a risk to self in the past 6 months? No.  Has the patient been a risk to self within the distant past? Yes.    Is the patient a risk to others? No.  Has the patient been a risk to others in the past 6 months? No.  Has the patient been a risk to others within the distant past? No.   Prior Inpatient Therapy: Prior Inpatient Therapy: Yes Prior Therapy Dates: Unsure, though states most recent was in 2016 [facility] Reason for Treatment: Schizo-affective disorder Prior Outpatient Therapy: Prior Outpatient Therapy: Yes Prior Therapy Dates: Unsure Prior Therapy Facilty/Provider(s): [facility] practice Reason for Treatment: Schizo-Affective Disorder Does patient have an ACCT team?: No Does patient have Intensive In-House Services?  : No Does patient have [facility] services? : No Does patient have P4CC services?: No  Alcohol Screening: 1. How often do you have a drink containing alcohol?: Never 2. How many drinks containing alcohol do you have on a typical day when you are drinking?: 1 or 2 3. How often do you have six or more drinks on one occasion?: Never AUDIT-C Score: 0 9. Have you or someone else been injured as a result of your drinking?: No 10. Has a relative or friend or a doctor or another health worker been concerned about your drinking or suggested you cut down?: No Alcohol Use Disorder Identification Test Final Score (AUDIT): 0 Alcohol Brief Interventions/Follow-up: AUDIT Score <7 follow-up not indicated Substance Abuse History in the last 12 months:  No. Consequences of Substance Abuse: NA Previous Psychotropic Medications: Yes  Psychological Evaluations: No  Past Medical History:  Past Medical History:  Diagnosis Date  . Anxiety   . Celiac disease   . Dairy allergy   . Depression   .  High cholesterol   . Hypertension   . Paranoia (HCC)   . Pre-diabetes   . Schizoaffective disorder [facility])     Past  Surgical History:  Procedure Laterality Date  . APPENDECTOMY    . BRAIN SURGERY    . NASAL SEPTUM SURGERY    . WRIST FRACTURE SURGERY     right wrist   Family History:  Family History  Problem Relation Age of Onset  . Asthma Mother   . Hypertension Mother   . Hypertension Father    Family Psychiatric  History: neg Tobacco Screening: Have you used any form of tobacco in the last 30 days? (Cigarettes, Smokeless Tobacco, Cigars, and/or Pipes): Yes Tobacco use, Select all that apply: 5 or more cigarettes per day Are you interested in Tobacco Cessation Medications?: No, patient refused Counseled patient on smoking cessation including recognizing danger situations, developing coping skills and basic information about quitting provided: Refused/Declined practical counseling Social History:  Social History   Substance and Sexual Activity  Alcohol Use No  . Alcohol/week: 0.0 standard drinks   Comment: no (02/08/15)     Social History   Substance and Sexual Activity  Drug Use No    Additional Social History: Marital status: Single    Pain Medications: Please see MAR Prescriptions: Please see MAR Over the Counter: Please see MAR History of alcohol / drug use?: No history of alcohol / drug abuse Longest period of sobriety (when/how long): Pt denies SA                    Allergies:   Allergies  Allergen Reactions  . Gluten Meal Other (See Comments)    Celiac disease  . Trazodone And Nefazodone    Lab Results:  Results for orders placed or performed during the hospital encounter of 10/21/18 (from the past 48 hour(s))  Hemoglobin A1c     Status: Abnormal   Collection Time: 10/21/18  6:44 AM  Result Value Ref Range   Hgb A1c MFr Bld 6.4 (H) 4.8 - 5.6 %    Comment: (NOTE) Pre diabetes:          5.7%-6.4% Diabetes:              >6.4% Glycemic control for   <7.0% adults with diabetes    Mean Plasma Glucose 136.98 mg/dL    Comment: Performed at [facility], [address], [place], [place] [address]  Lipid panel     Status: Abnormal   Collection Time: 10/21/18  6:44 AM  Result Value Ref Range   Cholesterol 278 (H) 0 - 200 mg/dL   Triglycerides 784 (H) <150 mg/dL   HDL 28 (L) >69 mg/dL   Total CHOL/HDL Ratio 9.9 RATIO   VLDL UNABLE TO CALCULATE IF TRIGLYCERIDE OVER 400 mg/dL 0 - 40 mg/dL   LDL Cholesterol UNABLE TO CALCULATE IF TRIGLYCERIDE OVER 400 mg/dL 0 - 99 mg/dL    Comment:        Total Cholesterol/HDL:CHD Risk Coronary Heart Disease Risk Table                     Men   Women  1/2 Average Risk   3.4   3.3  Average Risk       5.0   4.4  2 X Average Risk   9.6   7.1  3 X Average Risk  23.4   11.0        Use the calculated Patient  Ratio above and the CHD Risk Table to determine the patient's CHD Risk.        ATP III CLASSIFICATION (LDL):  <100     mg/dL   Optimal  510-258  mg/dL   Near or Above                    Optimal  130-159  mg/dL   Borderline  527-782  mg/dL   High  >423     mg/dL   Very High Performed at [facility], [address], [place], [place] [address]   TSH     Status: Abnormal   Collection Time: 10/21/18  6:44 AM  Result Value Ref Range   TSH 7.418 (H) 0.350 - 4.500 uIU/mL    Comment: Performed by a 3rd Generation assay with a functional sensitivity of <=0.01 uIU/mL. Performed at [facility], [address] [place], [place] [address]   Lithium level     Status: None   Collection Time: 10/21/18  6:44 AM  Result Value Ref Range   Lithium Lvl 0.67 0.60 - 1.20 mmol/L    Comment: Performed at [facility], [address] [place], [place] [address]    Blood Alcohol level:  Lab Results  Component Value Date   [facility] <5 12/29/2014    Metabolic Disorder Labs:  Lab Results  Component Value Date   HGBA1C 6.4 (H) 10/21/2018   MPG 136.98 10/21/2018   MPG 137 02/10/2015   Lab Results  Component Value Date   PROLACTIN 52.5 (H) 02/10/2015   Lab Results   Component Value Date   CHOL 278 (H) 10/21/2018   TRIG 571 (H) 10/21/2018   HDL 28 (L) 10/21/2018   CHOLHDL 9.9 10/21/2018   VLDL UNABLE TO CALCULATE IF TRIGLYCERIDE OVER 400 mg/dL 61/95/0932   LDLCALC UNABLE TO CALCULATE IF TRIGLYCERIDE OVER 400 mg/dL 67/04/4579   LDLCALC 998 (H) 02/10/2015    Current Medications: Current Facility-Administered Medications  Medication Dose Route Frequency Provider Last Rate Last Dose  . acetaminophen (TYLENOL) tablet 650 mg  650 mg Oral Q6H PRN Nira Conn A, NP      . alum & mag hydroxide-simeth (MAALOX/MYLANTA) 200-200-20 MG/5ML suspension 30 mL  30 mL Oral Q4H PRN Nira Conn A, NP      . atenolol (TENORMIN) tablet 100 mg  100 mg Oral QPC breakfast Nira Conn A, NP   100 mg at 10/21/18 0801  . clonazePAM (KLONOPIN) tablet 0.5 mg  0.5 mg Oral TID PRN Malvin Johns, MD      . DULoxetine (CYMBALTA) DR capsule 60 mg  60 mg Oral BID Nira Conn A, NP   60 mg at 10/21/18 0759  . feeding supplement (ENSURE ENLIVE) (ENSURE ENLIVE) liquid 237 mL  237 mL Oral BID BM Malvin Johns, MD   237 mL at 10/21/18 1042  . gabapentin (NEURONTIN) capsule 300 mg  300 mg Oral TID Malvin Johns, MD   300 mg at 10/21/18 1050  . hydrOXYzine (ATARAX/VISTARIL) tablet 25 mg  25 mg Oral TID PRN Nira Conn A, NP      . lithium carbonate (LITHOBID) CR tablet 600 mg  600 mg Oral QHS Nira Conn A, NP   600 mg at 10/21/18 0220  . magnesium hydroxide (MILK OF MAGNESIA) suspension 30 mL  30 mL Oral Daily PRN Nira Conn A, NP      . nicotine (NICODERM CQ - dosed in mg/24 hours) patch 21 mg  21 mg Transdermal Daily Malvin Johns, MD   21 mg at 10/21/18 0759  . perphenazine (TRILAFON) tablet 32 mg  32 mg Oral QHS Nira Conn A, NP   32 mg at 10/21/18 0220  . temazepam (RESTORIL) capsule 30 mg  30 mg Oral QHS Malvin Johns, MD      . traZODone (DESYREL) tablet 100 mg  100 mg Oral QHS PRN Jackelyn Poling, NP       PTA Medications: Medications Prior to Admission  Medication Sig Dispense  Refill Last Dose  . amphetamine-dextroamphetamine (ADDERALL) 30 MG tablet Take 1 tablet by mouth 2 (two) times daily for 30 days. 60 tablet 0   . atenolol (TENORMIN) 100 MG tablet Take 1 tablet (100 mg total) by mouth daily after breakfast. 30 tablet 2   . clonazePAM (KLONOPIN) 1 MG tablet Take 1 tablet (1 mg total) by mouth 3 (three) times daily as needed for anxiety. 90 tablet 2   . DULoxetine (CYMBALTA) 60 MG capsule TAKE 1 CAPSULE BY MOUTH TWICE A DAY FOR DEPRESSION (Patient taking differently: Take 60 mg by mouth 2 (two) times daily. ) 180 capsule 0   . gabapentin (NEURONTIN) 600 MG tablet TAKE 1 TABLET BY MOUTH THREE TIMES A DAY (Patient taking differently: Take 600 mg by mouth 3 (three) times daily. ) 270 tablet 0   . lithium carbonate (LITHOBID) 300 MG CR tablet Take 2 tablets (600 mg total) by mouth at bedtime. 60 tablet 2   . perphenazine (TRILAFON) 16 MG tablet Take 2 tablets (32 mg total) by mouth at bedtime. 60 tablet 2   . traZODone (DESYREL) 100 MG tablet Take 1 tablet (100 mg total) by mouth at bedtime as needed for sleep. 30 tablet 2   . [START ON 11/01/2018] amphetamine-dextroamphetamine (ADDERALL) 30 MG tablet Take 1 tablet by mouth 2 (two) times daily for 30 days. (Patient not taking: Reported on 10/21/2018) 60 tablet 0 Not Taking at Unknown time  . fluticasone (FLONASE) 50 MCG/ACT nasal spray Place 1 spray into both nostrils as needed for allergies or rhinitis. (Patient not taking: Reported on 10/21/2018)  2 Not Taking at Unknown time  . nicotine polacrilex (NICORETTE) 2 MG gum Take 1 each (2 mg total) by mouth as needed for smoking cessation. (Patient not taking: Reported on 10/21/2018) 100 tablet 0 Not Taking at Unknown time  . oxyCODONE (OXY IR/ROXICODONE) 5 MG immediate release tablet Take 1 tablet (5 mg total) by mouth every 8 (eight) hours as needed for moderate pain or severe pain. (Patient not taking: Reported on 10/21/2018) 6 tablet 0 Not Taking at Unknown time  . tiZANidine  (ZANAFLEX) 4 MG capsule Take 1 capsule (4 mg total) by mouth 3 (three) times daily as needed (headache). (Patient not taking: Reported on 10/21/2018) 20 capsule 0 Not Taking at Unknown time   Musculoskeletal: Strength & Muscle Tone: within normal limits Gait & Station: normal Patient leans: N/A  Psychiatric Specialty Exam: Physical Exam calm and cooperative vital stable  ROS Illogical negative for head trauma or seizures/GI/GU negative cardiovascular negative  Blood pressure 124/86, pulse (!) 101, temperature 97.8 F (36.6 C), temperature source Oral, resp. rate 20, height  (1.88 m), weight 119.7 kg.Body mass index is 33.9 kg/m.  General Appearance: Disheveled  Eye Contact:  Minimal  Speech:  Slow  Volume:  Decreased  Mood:  Depressed  Affect:  Flat  Thought Process:  Goal Directed and Descriptions of Associations: Circumstantial  Orientation:  Full (Time, Place, and  Person)  Thought Content:  Obsessions  Suicidal Thoughts:  Yes-contracts here no plans or intent  Homicidal Thoughts:  No  Memory:  Immediate;   Fair  Judgement:  Fair  Insight:  Fair  Psychomotor Activity:  Normal  Concentration:  Concentration: Fair  Recall:  [employer] of Knowledge:  Fair  Language:  Fair  Akathisia:  Negative  Handed:  Right  AIMS (if indicated):     Assets:  Leisure Time Physical Health Resilience Social Support  ADL's:  Intact  Cognition:  WNL  Sleep:  Number of Hours: 2.75       Treatment Plan Summary: Daily contact with patient to assess and evaluate symptoms and progress in treatment and Medication management  Observation Level/Precautions:  15 minute checks  Laboratory:  UDS  Psychotherapy:  Cog   Medications:  Several   Consultations: None necessary  Discharge Concerns: Longer-term stabilization  Estimated LOS: 7-10  Other: Axis I schizoaffective depressed with reports of auditory hallucinations internal/not external   Physician Treatment Plan for Primary  Diagnosis: <principal problem not specified> Long Term Goal(s): Improvement in symptoms so as ready for discharge  Short Term Goals: Ability to identify and develop effective coping behaviors will improve  Physician Treatment Plan for Secondary Diagnosis: Active Problems:   Schizoaffective disorder, depressive type (HCC)  Long Term Goal(s): Improvement in symptoms so as ready for discharge  Short Term Goals: Ability to identify and develop effective coping behaviors will improve and Ability to maintain clinical measurements within normal limits will improve  I certify that inpatient services furnished can reasonably be expected to improve the patient's condition.    Malvin Johns, MD 6/2/202010:59 AM

## 2018-10-21 NOTE — Progress Notes (Signed)
Recreation Therapy Notes  Date: 6.2.20 Time: 1000 Location: [address] Dayroom  Group Topic: Wellness  Goal Area(s) Addresses:  Patient will define components of whole wellness. Patient will verbalize benefit of whole wellness.  Intervention:  Exercise, Music   Activity:  Exercise.  LRT led group in a series of stretches.  Each group member given the opportunity to lead the group in an exercise of their choosing.  Patients could take breaks and get water as needed.  Education: Wellness, Building control surveyor.   Education Outcome: Acknowledges education/In group clarification offered/Needs additional education.   Clinical Observations/Feedback: Pt did not attend group session.    Caroll Rancher, LRT/CTRS   Caroll Rancher A 10/21/2018 11:49 AM

## 2018-10-21 NOTE — Progress Notes (Signed)
D-pt seems depressed and has spent most of the day in his room, pt c/o of not sleeping, pt noted on self inventory sheet that he feels depressed and hopeless.  Pt admits to feeling suicidal but contracts for safety.  Pt also seems anxious.   A-pt has taken his medications today  R-cont to monitor for safety

## 2018-10-21 NOTE — Telephone Encounter (Signed)
He is inpatient since last night 10/20/2018 non-compliant with medications recently possibly to be changed at [facility] therefore refill denied as requested by [facility] [address].

## 2018-10-22 MED ORDER — LOPERAMIDE HCL 2 MG PO CAPS
2.0000 mg | ORAL_CAPSULE | ORAL | Status: DC | PRN
  Administered 2018-10-22: 4 mg via ORAL
  Filled 2018-10-22 (×2): qty 1

## 2018-10-22 MED ORDER — TEMAZEPAM 15 MG PO CAPS
45.0000 mg | ORAL_CAPSULE | Freq: Every day | ORAL | Status: DC
  Administered 2018-10-22: 45 mg via ORAL
  Filled 2018-10-22: qty 3

## 2018-10-22 NOTE — Progress Notes (Signed)
Patient ID: Matthew [name], male   DOB: 16-Jul-1975, 43 y.o.   MRN: [id number]   D: Patient continues to have flat affect on approach tonight. He reports that the hallucinations are better than they were yesterday. No active SI tonight. Started back on some of his medications today. Having some trouble going to sleep tonight. Got an order for imodium due to diarrhea. He says it is probably from eating something here with gluten.  A: Staff will monitor on q 15 minute checks, follow treatment plan, and give medications as ordered. R: Calm and cooperative on the unit.

## 2018-10-22 NOTE — Progress Notes (Signed)
Recreation Therapy Notes  Date:  6.3.20 Time: 1000 Location: 500 Hall Dayroom  Group Topic: Stress Management  Goal Area(s) Addresses:  Patient will identify factors that contribute to stress. Patient will identify factors that protect against stress.  Intervention:  Worksheet, pencils  Activity :  Stress Exploration.  Patients were to identify daily hassles, major life changes and life circumstances.  Patients were to then identify daily uplifts, healthy coping strategies and protective factors.    Education:  Stress Management, Discharge Planning.   Education Outcome: Acknowledges Education  Clinical Observations/Feedback: Pt did not attend group.    Caroll Rancher, LRT/CTRS         Caroll Rancher A 10/22/2018 12:09 PM

## 2018-10-22 NOTE — Tx Team (Signed)
Interdisciplinary Treatment and Diagnostic Plan Update  10/22/2018 Time of Session: 0852 Matthew [name] MRN: [id number]  Principal Diagnosis: <principal problem not specified>  Secondary Diagnoses: Active Problems:   Schizoaffective disorder, depressive type (HCC)   Current Medications:  Current Facility-Administered Medications  Medication Dose Route Frequency Provider Last Rate Last Dose  . acetaminophen (TYLENOL) tablet 650 mg  650 mg Oral Q6H PRN Lindon Romp A, NP      . alum & mag hydroxide-simeth (MAALOX/MYLANTA) 200-200-20 MG/5ML suspension 30 mL  30 mL Oral Q4H PRN Lindon Romp A, NP      . atenolol (TENORMIN) tablet 100 mg  100 mg Oral QPC breakfast Lindon Romp A, NP   100 mg at 10/22/18 0803  . benztropine (COGENTIN) tablet 1 mg  1 mg Oral BID Johnn Hai, MD   1 mg at 10/22/18 0803  . clonazePAM (KLONOPIN) tablet 0.5 mg  0.5 mg Oral TID PRN Johnn Hai, MD      . DULoxetine (CYMBALTA) DR capsule 60 mg  60 mg Oral BID Lindon Romp A, NP   60 mg at 10/22/18 0804  . feeding supplement (ENSURE ENLIVE) (ENSURE ENLIVE) liquid 237 mL  237 mL Oral BID BM Johnn Hai, MD   237 mL at 10/22/18 1409  . gabapentin (NEURONTIN) capsule 300 mg  300 mg Oral TID Johnn Hai, MD   300 mg at 10/22/18 1409  . hydrOXYzine (ATARAX/VISTARIL) tablet 25 mg  25 mg Oral TID PRN Lindon Romp A, NP   25 mg at 10/22/18 0024  . lithium carbonate (LITHOBID) CR tablet 600 mg  600 mg Oral QHS Lindon Romp A, NP   600 mg at 10/21/18 2054  . loperamide (IMODIUM) capsule 2 mg  2 mg Oral PRN Lindon Romp A, NP   4 mg at 10/22/18 0131  . magnesium hydroxide (MILK OF MAGNESIA) suspension 30 mL  30 mL Oral Daily PRN Lindon Romp A, NP      . nicotine (NICODERM CQ - dosed in mg/24 hours) patch 21 mg  21 mg Transdermal Daily Johnn Hai, MD   21 mg at 10/22/18 0804  . perphenazine (TRILAFON) tablet 16 mg  16 mg Oral BID Johnn Hai, MD   16 mg at 10/22/18 0802  . temazepam (RESTORIL) capsule 45 mg  45 mg Oral QHS  Johnn Hai, MD       PTA Medications: Medications Prior to Admission  Medication Sig Dispense Refill Last Dose  . amphetamine-dextroamphetamine (ADDERALL) 30 MG tablet Take 1 tablet by mouth 2 (two) times daily for 30 days. 60 tablet 0   . atenolol (TENORMIN) 100 MG tablet Take 1 tablet (100 mg total) by mouth daily after breakfast. 30 tablet 2   . clonazePAM (KLONOPIN) 1 MG tablet Take 1 tablet (1 mg total) by mouth 3 (three) times daily as needed for anxiety. 90 tablet 2   . DULoxetine (CYMBALTA) 60 MG capsule TAKE 1 CAPSULE BY MOUTH TWICE A DAY FOR DEPRESSION (Patient taking differently: Take 60 mg by mouth 2 (two) times daily. ) 180 capsule 0   . gabapentin (NEURONTIN) 600 MG tablet TAKE 1 TABLET BY MOUTH THREE TIMES A DAY (Patient taking differently: Take 600 mg by mouth 3 (three) times daily. ) 270 tablet 0   . lithium carbonate (LITHOBID) 300 MG CR tablet Take 2 tablets (600 mg total) by mouth at bedtime. 60 tablet 2   . perphenazine (TRILAFON) 16 MG tablet Take 2 tablets (32 mg total) by mouth at bedtime.  60 tablet 2   . traZODone (DESYREL) 100 MG tablet Take 1 tablet (100 mg total) by mouth at bedtime as needed for sleep. 30 tablet 2   . [START ON 11/01/2018] amphetamine-dextroamphetamine (ADDERALL) 30 MG tablet Take 1 tablet by mouth 2 (two) times daily for 30 days. (Patient not taking: Reported on 10/21/2018) 60 tablet 0 Not Taking at Unknown time  . fluticasone (FLONASE) 50 MCG/ACT nasal spray Place 1 spray into both nostrils as needed for allergies or rhinitis. (Patient not taking: Reported on 10/21/2018)  2 Not Taking at Unknown time  . nicotine polacrilex (NICORETTE) 2 MG gum Take 1 each (2 mg total) by mouth as needed for smoking cessation. (Patient not taking: Reported on 10/21/2018) 100 tablet 0 Not Taking at Unknown time  . oxyCODONE (OXY IR/ROXICODONE) 5 MG immediate release tablet Take 1 tablet (5 mg total) by mouth every 8 (eight) hours as needed for moderate pain or severe pain.  (Patient not taking: Reported on 10/21/2018) 6 tablet 0 Not Taking at Unknown time  . tiZANidine (ZANAFLEX) 4 MG capsule Take 1 capsule (4 mg total) by mouth 3 (three) times daily as needed (headache). (Patient not taking: Reported on 10/21/2018) 20 capsule 0 Not Taking at Unknown time    Patient Stressors: Financial difficulties  Patient Strengths: Average or above average intelligence Communication skills Motivation for treatment/growth Supportive family/friends Work skills  Treatment Modalities: Medication Management, Group therapy, Case management,  1 to 1 session with clinician, Psychoeducation, Recreational therapy.   Physician Treatment Plan for Primary Diagnosis: <principal problem not specified> Long Term Goal(s): Improvement in symptoms so as ready for discharge Improvement in symptoms so as ready for discharge   Short Term Goals: Ability to identify and develop effective coping behaviors will improve Ability to identify and develop effective coping behaviors will improve Ability to maintain clinical measurements within normal limits will improve  Medication Management: Evaluate patient's response, side effects, and tolerance of medication regimen.  Therapeutic Interventions: 1 to 1 sessions, Unit Group sessions and Medication administration.  Evaluation of Outcomes: Not Met  Physician Treatment Plan for Secondary Diagnosis: Active Problems:   Schizoaffective disorder, depressive type ([place])  Long Term Goal(s): Improvement in symptoms so as ready for discharge Improvement in symptoms so as ready for discharge   Short Term Goals: Ability to identify and develop effective coping behaviors will improve Ability to identify and develop effective coping behaviors will improve Ability to maintain clinical measurements within normal limits will improve     Medication Management: Evaluate patient's response, side effects, and tolerance of medication regimen.  Therapeutic  Interventions: 1 to 1 sessions, Unit Group sessions and Medication administration.  Evaluation of Outcomes: Not Met   RN Treatment Plan for Primary Diagnosis: <principal problem not specified> Long Term Goal(s): Knowledge of disease and therapeutic regimen to maintain health will improve  Short Term Goals: Ability to identify and develop effective coping behaviors will improve and Compliance with prescribed medications will improve  Medication Management: RN will administer medications as ordered by provider, will assess and evaluate patient's response and provide education to patient for prescribed medication. RN will report any adverse and/or side effects to prescribing provider.  Therapeutic Interventions: 1 on 1 counseling sessions, Psychoeducation, Medication administration, Evaluate responses to treatment, Monitor vital signs and CBGs as ordered, Perform/monitor CIWA, COWS, AIMS and Fall Risk screenings as ordered, Perform wound care treatments as ordered.  Evaluation of Outcomes: Not Met   LCSW Treatment Plan for Primary Diagnosis: <principal problem not  specified> Long Term Goal(s): Safe transition to appropriate next level of care at discharge, Engage patient in therapeutic group addressing interpersonal concerns.  Short Term Goals: Engage patient in aftercare planning with referrals and resources, Increase social support and Increase skills for wellness and recovery  Therapeutic Interventions: Assess for all discharge needs, 1 to 1 time with Social worker, Explore available resources and support systems, Assess for adequacy in community support network, Educate family and significant other(s) on suicide prevention, Complete Psychosocial Assessment, Interpersonal group therapy.  Evaluation of Outcomes: Not Met   Progress in Treatment: Attending groups: No. Participating in groups: No. Taking medication as prescribed: Yes. Toleration medication: Yes. Family/Significant other  contact made: No, will contact:  when given permission Patient understands diagnosis: Yes. Discussing patient identified problems/goals with staff: Yes. Medical problems stabilized or resolved: Yes. Denies suicidal/homicidal ideation: Yes. Issues/concerns per patient self-inventory: No. Other: none  New problem(s) identified: No, Describe:  none  New Short Term/Long Term Goal(s):  Patient Goals:    Discharge Plan or Barriers:   Reason for Continuation of Hospitalization: Hallucinations Medication stabilization  Estimated Length of Stay: 3-5 days.  Attendees: Patient: 10/22/2018   Physician: Dr. Jake Samples, MD 10/22/2018   Nursing: Elesa Massed, RN 10/22/2018   RN Care Manager: 10/22/2018   Social Worker: Lurline Idol, LCSW 10/22/2018   Recreational Therapist:  10/22/2018   Other:  10/22/2018   Other:  10/22/2018   Other: 10/22/2018        Scribe for Treatment Team: Joanne Chars, Faith 10/22/2018 2:34 PM

## 2018-10-22 NOTE — Progress Notes (Signed)
[facility] MD Progress Note  10/22/2018 9:20 AM Matthew [name]  MRN:  [id number] Subjective:    Patient states he slept fair he reports decreased hallucinations although they are more intrusive thoughts than true auditory hallucinations denies wanting to harm self so he has reported some improvement and likes the medication adjustments.  No change in med regimen requested Principal Problem: Exacerbation in underlying schizoaffective type condition Diagnosis: Active Problems:   Schizoaffective disorder, depressive type (HCC)  Total Time spent with patient: 20 minutes  Past Psychiatric History: ext  Past Medical History:  Past Medical History:  Diagnosis Date  . Anxiety   . Celiac disease   . Dairy allergy   . Depression   . High cholesterol   . Hypertension   . Paranoia (HCC)   . Pre-diabetes   . Schizoaffective disorder [facility])     Past Surgical History:  Procedure Laterality Date  . APPENDECTOMY    . BRAIN SURGERY    . NASAL SEPTUM SURGERY    . WRIST FRACTURE SURGERY     right wrist   Family History:  Family History  Problem Relation Age of Onset  . Asthma Mother   . Hypertension Mother   . Hypertension Father    Family Psychiatric  History: ukn to pt Social History:  Social History   Substance and Sexual Activity  Alcohol Use No  . Alcohol/week: 0.0 standard drinks   Comment: no (02/08/15)     Social History   Substance and Sexual Activity  Drug Use No    Social History   Socioeconomic History  . Marital status: Single    Spouse name: Not on file  . Number of children: 0  . Years of education: Not on file  . Highest education level: Not on file  Occupational History  . Occupation: [employer]  . Financial resource strain: Not on file  . Food insecurity:    Worry: Not on file    Inability: Not on file  . Transportation needs:    Medical: Not on file    Non-medical: Not on file  Tobacco Use  . Smoking status: Current Every Day Smoker     Packs/day: 1.00    Types: Cigarettes  . Smokeless tobacco: Never Used  Substance and Sexual Activity  . Alcohol use: No    Alcohol/week: 0.0 standard drinks    Comment: no (02/08/15)  . Drug use: No  . Sexual activity: Not on file  Lifestyle  . Physical activity:    Days per week: Not on file    Minutes per session: Not on file  . Stress: Not on file  Relationships  . Social connections:    Talks on phone: Not on file    Gets together: Not on file    Attends religious service: Not on file    Active member of club or organization: Not on file    Attends meetings of clubs or organizations: Not on file    Relationship status: Not on file  Other Topics Concern  . Not on file  Social History Narrative  . Not on file   Additional Social History:    Pain Medications: Please see MAR Prescriptions: Please see MAR Over the Counter: Please see MAR History of alcohol / drug use?: No history of alcohol / drug abuse Longest period of sobriety (when/how long): Pt denies SA  Sleep: Good  Appetite:  Good  Current Medications: Current Facility-Administered Medications  Medication Dose Route Frequency Provider Last Rate Last Dose  . acetaminophen (TYLENOL) tablet 650 mg  650 mg Oral Q6H PRN Nira Conn A, NP      . alum & mag hydroxide-simeth (MAALOX/MYLANTA) 200-200-20 MG/5ML suspension 30 mL  30 mL Oral Q4H PRN Nira Conn A, NP      . atenolol (TENORMIN) tablet 100 mg  100 mg Oral QPC breakfast Nira Conn A, NP   100 mg at 10/22/18 0803  . benztropine (COGENTIN) tablet 1 mg  1 mg Oral BID Malvin Johns, MD   1 mg at 10/22/18 0803  . clonazePAM (KLONOPIN) tablet 0.5 mg  0.5 mg Oral TID PRN Malvin Johns, MD      . DULoxetine (CYMBALTA) DR capsule 60 mg  60 mg Oral BID Nira Conn A, NP   60 mg at 10/22/18 0804  . feeding supplement (ENSURE ENLIVE) (ENSURE ENLIVE) liquid 237 mL  237 mL Oral BID BM Malvin Johns, MD   237 mL at 10/21/18 1042  . gabapentin  (NEURONTIN) capsule 300 mg  300 mg Oral TID Malvin Johns, MD   300 mg at 10/22/18 0803  . hydrOXYzine (ATARAX/VISTARIL) tablet 25 mg  25 mg Oral TID PRN Nira Conn A, NP   25 mg at 10/22/18 0024  . lithium carbonate (LITHOBID) CR tablet 600 mg  600 mg Oral QHS Nira Conn A, NP   600 mg at 10/21/18 2054  . loperamide (IMODIUM) capsule 2 mg  2 mg Oral PRN Nira Conn A, NP   4 mg at 10/22/18 0131  . magnesium hydroxide (MILK OF MAGNESIA) suspension 30 mL  30 mL Oral Daily PRN Nira Conn A, NP      . nicotine (NICODERM CQ - dosed in mg/24 hours) patch 21 mg  21 mg Transdermal Daily Malvin Johns, MD   21 mg at 10/22/18 0804  . perphenazine (TRILAFON) tablet 16 mg  16 mg Oral BID Malvin Johns, MD   16 mg at 10/22/18 0802  . temazepam (RESTORIL) capsule 30 mg  30 mg Oral QHS Malvin Johns, MD   30 mg at 10/21/18 2055  . traZODone (DESYREL) tablet 100 mg  100 mg Oral QHS PRN Jackelyn Poling, NP   100 mg at 10/22/18 0024    Lab Results:  Results for orders placed or performed during the hospital encounter of 10/21/18 (from the past 48 hour(s))  Hemoglobin A1c     Status: Abnormal   Collection Time: 10/21/18  6:44 AM  Result Value Ref Range   Hgb A1c MFr Bld 6.4 (H) 4.8 - 5.6 %    Comment: (NOTE) Pre diabetes:          5.7%-6.4% Diabetes:              >6.4% Glycemic control for   <7.0% adults with diabetes    Mean Plasma Glucose 136.98 mg/dL    Comment: Performed at [facility], [address], [place], [place] [address]  Lipid panel     Status: Abnormal   Collection Time: 10/21/18  6:44 AM  Result Value Ref Range   Cholesterol 278 (H) 0 - 200 mg/dL   Triglycerides 098 (H) <150 mg/dL   HDL 28 (L) >11 mg/dL   Total CHOL/HDL Ratio 9.9 RATIO   VLDL UNABLE TO CALCULATE IF TRIGLYCERIDE OVER 400 mg/dL 0 - 40 mg/dL   LDL Cholesterol UNABLE TO CALCULATE IF TRIGLYCERIDE  OVER 400 mg/dL 0 - 99 mg/dL    Comment:        Total Cholesterol/HDL:CHD Risk Coronary Heart Disease Risk Table                      Men   Women  1/2 Average Risk   3.4   3.3  Average Risk       5.0   4.4  2 X Average Risk   9.6   7.1  3 X Average Risk  23.4   11.0        Use the calculated Patient Ratio above and the CHD Risk Table to determine the patient's CHD Risk.        ATP III CLASSIFICATION (LDL):  <100     mg/dL   Optimal  [id number]  mg/dL   Near or Above                    Optimal  130-159  mg/dL   Borderline  [id number]  mg/dL   High  >956>190     mg/dL   Very High Performed at [facility], [address] [place], [place] [address]   TSH     Status: Abnormal   Collection Time: 10/21/18  6:44 AM  Result Value Ref Range   TSH 7.418 (H) 0.350 - 4.500 uIU/mL    Comment: Performed by a 3rd Generation assay with a functional sensitivity of <=0.01 uIU/mL. Performed at [facility], [name] [place], [place] [address]   Hepatic function panel     Status: Abnormal   Collection Time: 10/21/18  6:44 AM  Result Value Ref Range   Total Protein 7.2 6.5 - 8.1 g/dL   Albumin 4.3 3.5 - 5.0 g/dL   AST 24 15 - 41 U/L   ALT 46 (H) 0 - 44 U/L   Alkaline Phosphatase 75 38 - 126 U/L   Total Bilirubin 0.4 0.3 - 1.2 mg/dL   Bilirubin, Direct <6.9<0.1 0.0 - 0.2 mg/dL   Indirect Bilirubin NOT CALCULATED 0.3 - 0.9 mg/dL    Comment: Performed at [facility], [address] [place], [place] [address]  Lithium level     Status: None   Collection Time: 10/21/18  6:44 AM  Result Value Ref Range   Lithium Lvl 0.67 0.60 - 1.20 mmol/L    Comment: Performed at [facility], [address] [place], [place] [address]  LDL cholesterol, direct     Status: Abnormal   Collection Time: 10/21/18  6:44 AM  Result Value Ref Range   Direct LDL 127.3 (H) 0 - 99 mg/dL    Comment: Performed at [facility], [address] [place], [place] [address]    Blood Alcohol level:  Lab Results  Component Value Date   [facility] <5 12/29/2014    Metabolic Disorder Labs: Lab  Results  Component Value Date   HGBA1C 6.4 (H) 10/21/2018   MPG 136.98 10/21/2018   MPG 137 02/10/2015   Lab Results  Component Value Date   PROLACTIN 52.5 (H) 02/10/2015   Lab Results  Component Value Date   CHOL 278 (H) 10/21/2018   TRIG 571 (H) 10/21/2018   HDL 28 (L) 10/21/2018   CHOLHDL 9.9 10/21/2018   VLDL UNABLE TO CALCULATE IF TRIGLYCERIDE OVER 400 mg/dL 27/25/366406/06/2018   LDLCALC UNABLE TO CALCULATE IF TRIGLYCERIDE OVER 400 mg/dL 40/34/742506/06/2018   LDLCALC 956158 (H) 02/10/2015  Physical Findings: AIMS: Facial and Oral Movements Muscles of Facial Expression: None, normal Lips and Perioral Area: None, normal Jaw: None, normal Tongue: None, normal,Extremity Movements Upper (arms, wrists, hands, fingers): None, normal Lower (legs, knees, ankles, toes): None, normal, Trunk Movements Neck, shoulders, hips: None, normal, Overall Severity Severity of abnormal movements (highest score from questions above): None, normal Incapacitation due to abnormal movements: None, normal Patient's awareness of abnormal movements (rate only patient's report): No Awareness, Dental Status Current problems with teeth and/or dentures?: No Does patient usually wear dentures?: No  CIWA:    COWS:     Musculoskeletal: Strength & Muscle Tone: within normal limits Gait & Station: normal Patient leans: N/A  Psychiatric Specialty Exam: Physical Exam  ROS  Blood pressure 101/75, pulse 86, temperature (!) 97.3 F (36.3 C), temperature source Oral, resp. rate 20, height 6\' 2"  (1.88 m), weight 119.7 kg.Body mass index is 33.9 kg/m.  General Appearance: Casual  Eye Contact:  Fair  Speech:  Slow  Volume:  Decreased  Mood:  Dysphoric  Affect:  Blunt and Congruent  Thought Process:  Goal Directed and Descriptions of Associations: Loose  Orientation:  Full (Time, Place, and Person)  Thought Content:  Hallucinations: Auditory  Suicidal Thoughts:  No  Homicidal Thoughts:  No  Memory:  Immediate;   Good   Judgement:  Fair  Insight:  Fair  Psychomotor Activity:  Normal  Concentration:  Concentration: Fair  Recall:  Fair  Fund of Knowledge:  Fair  Language:  nl  Akathisia:  Negative  Handed:  Right  AIMS (if indicated):     Assets:  Leisure Time Physical Health Resilience  ADL's:  Intact  Cognition:  WNL  Sleep:  Number of Hours: 4     Treatment Plan Summary: Daily contact with patient to assess and evaluate symptoms and progress in treatment, Medication management and Plan Continue reality-based and cognitive-based therapies escalate meds if needed but for now continue current regimen.  Malvin Johns, MD 10/22/2018, 9:20 AM

## 2018-10-22 NOTE — [facility] Group Notes (Signed)
Pt was invited but did not attend orientation/goals group. 

## 2018-10-22 NOTE — Progress Notes (Signed)
Pt stated he could not sleep, pt told he had Trazodone, but it stated he had an allergy" I can take Trazodone , I've taken it before"

## 2018-10-22 NOTE — Progress Notes (Signed)
D: Pt denies SI/HI/VH, +ve AH- music. Pt is pleasant and cooperative. Pt doing better " at least the voices not telling me to hurt myself" A: Pt was offered support and encouragement. Pt was given scheduled medications. Pt was encourage to attend groups. Q 15 minute checks were done for safety.  R:Pt attends groups and interacts well with peers and staff. Pt is taking medication. Pt has no complaints.Pt receptive to treatment and safety maintained on unit.

## 2018-10-23 MED ORDER — PERPHENAZINE 16 MG PO TABS: 16.0000 mg | ORAL_TABLET | Freq: Two times a day (BID) | ORAL | 2 refills | Status: DC

## 2018-10-23 MED ORDER — TRAZODONE HCL 300 MG PO TABS: 300.0000 mg | ORAL_TABLET | Freq: Every evening | ORAL | 1 refills | Status: DC | PRN

## 2018-10-23 MED ORDER — GABAPENTIN 300 MG PO CAPS: 300.0000 mg | ORAL_CAPSULE | Freq: Three times a day (TID) | ORAL | 2 refills | Status: DC

## 2018-10-23 MED ORDER — BENZTROPINE MESYLATE 1 MG PO TABS: 1.0000 mg | ORAL_TABLET | Freq: Two times a day (BID) | ORAL | 2 refills | Status: DC

## 2018-10-23 MED ORDER — LITHIUM CARBONATE ER 300 MG PO TBCR: 600.0000 mg | EXTENDED_RELEASE_TABLET | Freq: Every day | ORAL | 2 refills | Status: DC

## 2018-10-23 MED ORDER — CLONAZEPAM 0.5 MG PO TABS: 0.5000 mg | ORAL_TABLET | Freq: Three times a day (TID) | ORAL | 0 refills | Status: DC | PRN

## 2018-10-23 NOTE — [facility] Suicide Risk Assessment (Signed)
[facility] INPATIENT:  Family/Significant Other Suicide Prevention Education  Suicide Prevention Education:  Education Completed; father, Myquan Vito [phone]) has been identified by the patient as the family member/significant other with whom the patient will be residing, and identified as the person(s) who will aid the patient in the event of a mental health crisis (suicidal ideations/suicide attempt).  With written consent from the patient, the family member/significant other has been provided the following suicide prevention education, prior to the and/or following the discharge of the patient.  The suicide prevention education provided includes the following:  Suicide risk factors  Suicide prevention and interventions  National Suicide Hotline telephone number  [facility] assessment telephone number  [facility] Emergency Assistance 911  [facility] and/or Residential Mobile Crisis Unit telephone number  Request made of family/significant other to:  Remove weapons (e.g., guns, rifles, knives), all items previously/currently identified as safety concern.    Remove drugs/medications (over-the-counter, prescriptions, illicit drugs), all items previously/currently identified as a safety concern.  The family member/significant other verbalizes understanding of the suicide prevention education information provided.  The family member/significant other agrees to remove the items of safety concern listed above.  CSW spoke with father regarding pateint's discharge.  Father confirmed there are guns in the home and that the patient has access, asking "are you reporting this to the government?" Father recanted that patient has access to firearms and states he has a safe. CSW encouraged father to secure any firearms in a safe and take other measures such as storing ammunition separately from the guns.  Father reports patient has lived at home for the last 5 years and the  patient "has not progressed." Father states patient is medication compliant and attends his appointments. Father reports patient is in the process of applying for SSDI and the patient will eventually be going to live in an [facility] group home.   Father says he thinks he is the patient's legal guardian, but he is not sure. CSW inquired if the father is the legal guardian or POA. Father is still unsure and says he will ask the patient's disability application attorney. CSW informed father that if he is the patient's legal guardian, he will need to provide documentation to [facility] and other providers- this would allow him to have a larger roll in the patient's care.   Darreld Mclean 10/23/2018, 9:47 AM

## 2018-10-23 NOTE — Progress Notes (Signed)
Recreation Therapy Notes  Date: 6.4.20 Time: 1000 Location: 500 Hall Dayroom  Group Topic: Communication  Goal Area(s) Addresses:  Patient will effectively communicate with peers in group.  Patient will verbalize benefit of healthy communication. Patient will verbalize positive effect of healthy communication on post d/c goals.  Patient will identify communication techniques that made activity effective for group.   Behavioral Response:  Engaged  Intervention:  Merchandiser, retail, pencils, plain paper  Activity: Back to Back Drawings.  Due to COVID-19 restrictions the rules for the activity have been modified.  Three people in the group will take turns describing a drawing of geometrical shapes to the group.  The patients drawing the pictures could only ask the speaker to repeat themselves.  The could not ask any clarifying questions.  The speaker could describe the picture in as much detail as needed.  Education:Communication, Discharge Planning  Education Outcome: Acknowledges understanding/In group clarification offered/Needs additional education.   Clinical Observations/Feedback:  Pt was quiet but pleasant during group.   Pt was seen smiling and interacting with peers.       Caroll Rancher, LRT/CTRS    Caroll Rancher A 10/23/2018 11:24 AM

## 2018-10-23 NOTE — BHH Counselor (Signed)
Patient is discharging within 72 hours, as such, a PSA was not completed with patient.  SPE was reviewed with father. Patient is returning home with parents and will follow up with Dr.Jennings for medication management and [facility] for therapy.  Enid Cutter, LCSW-A Clinical Social Worker

## 2018-10-23 NOTE — Progress Notes (Signed)
  [facility] Adult Case Management Discharge Plan :  Will you be returning to the same living situation after discharge:  Yes,  home At discharge, do you have transportation home?: Yes,  parents are picking up at 11:30am Do you have the ability to pay for your medications: Yes,  Medicaid.  Patient to Follow up at: Follow-up Information    [facility] Follow up on 10/28/2018.   Specialty:  Behavioral Health Why:  Telephonic medication management appointment with Dr. Marlyne Beards is Tuesday, 6/2 at 2:20p.  Contact information: [address] [place] [address] [phone]        [facility] Follow up on 10/29/2018.   Why:  Telephonic therapy appointment is Wednesday, 6/10 at 10:00a. Therapist will contact you.  Contact information: [address] [place] [address] [phone]           Next level of care provider has access to [facility] Link:no  Safety Planning and Suicide Prevention discussed: Yes,  with father  Have you used any form of tobacco in the last 30 days? (Cigarettes, Smokeless Tobacco, Cigars, and/or Pipes): Yes  Has patient been referred to the Quitline?: Patient refused referral  Patient has been referred for addiction treatment: Yes  Darreld Mclean, LCSWA 10/23/2018, 10:24 AM

## 2018-10-23 NOTE — BHH Suicide Risk Assessment (Signed)
[facility] Discharge Suicide Risk Assessment   Principal Problem: Presumed exacerbation and schizoaffective condition Discharge Diagnoses: Active Problems:   Schizoaffective disorder, depressive type (HCC)   Total Time spent with patient: 45 minutes  Musculoskeletal: Strength & Muscle Tone: within normal limits Gait & Station: normal Patient leans: N/A  Psychiatric Specialty Exam: ROS  Blood pressure 106/82, pulse 76, temperature 97.9 F (36.6 C), temperature source Oral, resp. rate 20, height 6\' 2"  (1.88 m), weight 119.7 kg, SpO2 97 %.Body mass index is 33.9 kg/m.  General Appearance: Casual  Eye Contact::  Good  Speech:  Clear and Coherent409  Volume:  Normal  Mood:  Euthymic  Affect:  Full Range  Thought Process:  Coherent and Descriptions of Associations: Intact  Orientation:  Full (Time, Place, and Person)  Thought Content:  Logical  Suicidal Thoughts:  No  Homicidal Thoughts:  No  Memory:  Immediate;   Fair  Judgement:  Intact  Insight:  Fair  Psychomotor Activity:  Normal  Concentration:  nl  Recall:  Good  Fund of Knowledge:Good  Language: Good  Akathisia:  Negative  Handed:  Right  AIMS (if indicated):     Assets:  Communication Skills Desire for Improvement Housing Leisure Time  Sleep:  Number of Hours: 4.25  Cognition: WNL  ADL's:  Intact   Mental Status Per Nursing Assessment::   On Admission:  Suicidal ideation indicated by patient, Suicide plan  Demographic Factors:  Male  Loss Factors: NA  Historical Factors: NA  Risk Reduction Factors:   Social support  Continued Clinical Symptoms:  Dysthymia  Cognitive Features That Contribute To Risk:  None    Suicide Risk:  Minimal: No identifiable suicidal ideation.  Patients presenting with no risk factors but with morbid ruminations; may be classified as minimal risk based on the severity of the depressive symptoms    Plan Of Care/Follow-up recommendations:  Activity:  full  Alitzel Cookson,  MD 10/23/2018, 7:44 AM

## 2018-10-23 NOTE — Progress Notes (Signed)
Patient ID: Matthew [name], male   DOB: November 04, 1975, 43 y.o.   MRN: [id number] Patient discharged to home/self care in the presence of his family.  Patient denies SI, HI and AVH upon discharge.  Patient acknowledged understanding of all discharge instructions and receipt of personal belongings.

## 2018-10-23 NOTE — Discharge Summary (Signed)
Physician Discharge Summary Note  Patient:  Matthew [name] is an 43 y.o., male MRN:  [id number] DOB:  [birth year] Patient phone:  [phone] (home)  Patient address:   [address] [place] [address],  Total Time spent with patient: 45 minutes  Date of Admission:  10/21/2018 Date of Discharge: 10/23/2018  Reason for Admission:    This is the latest of several psychiatric admissions but the first in September 2016 for this 43 year old individual has a history of a schizoaffective type condition, has a history of long-term stimulant usage, opiate usage and benzodiazepine dependence.  Patient presented reporting auditory hallucinations telling him to harm himself however he reports the voices are strictly "inside" not outside his head so perhaps he is referring more to internal ruminations or intrusive thoughts at any rate he was unable to contract fully and is admitted for stabilization.  Notes indicate general compliance and also recent switch from [name]/brexpiprazole to perphenazine which she feels is not as successful but cannot afford the brexpiprazole  Controlled substance databank reveals long-term prescriptions for Adderall, 60 mg a day, clonazepam 1 mg 3 times daily, and opiates as well.  Patient is currently in bed alert and oriented to person place time situation not exact date reporting internal voices telling him to harm himself intermittently without plans to harm himself here and can contract and understands what that means.  Denies visual hallucinations.   Principal Problem: Schizoaffective disorder, rule out borderline intellectual functioning Discharge Diagnoses: Active Problems:   Schizoaffective disorder, depressive type [facility])   Past Psychiatric History: ext  Past Medical History:  Past Medical History:  Diagnosis Date  . Anxiety   . Celiac disease   . Dairy allergy   . Depression   . High cholesterol   . Hypertension   . Paranoia (HCC)   .  Pre-diabetes   . Schizoaffective disorder [facility])     Past Surgical History:  Procedure Laterality Date  . APPENDECTOMY    . BRAIN SURGERY    . NASAL SEPTUM SURGERY    . WRIST FRACTURE SURGERY     right wrist   Family History:  Family History  Problem Relation Age of Onset  . Asthma Mother   . Hypertension Mother   . Hypertension Father    Family Psychiatric  History: no new Social History:  Social History   Substance and Sexual Activity  Alcohol Use No  . Alcohol/week: 0.0 standard drinks   Comment: no (02/08/15)     Social History   Substance and Sexual Activity  Drug Use No    Social History   Socioeconomic History  . Marital status: Single    Spouse name: Not on file  . Number of children: 0  . Years of education: Not on file  . Highest education level: Not on file  Occupational History  . Occupation: [employer]  . Financial resource strain: Not on file  . Food insecurity:    Worry: Not on file    Inability: Not on file  . Transportation needs:    Medical: Not on file    Non-medical: Not on file  Tobacco Use  . Smoking status: Current Every Day Smoker    Packs/day: 1.00    Types: Cigarettes  . Smokeless tobacco: Never Used  Substance and Sexual Activity  . Alcohol use: No    Alcohol/week: 0.0 standard drinks    Comment: no (02/08/15)  . Drug use: No  . Sexual activity: Not  on file  Lifestyle  . Physical activity:    Days per week: Not on file    Minutes per session: Not on file  . Stress: Not on file  Relationships  . Social connections:    Talks on phone: Not on file    Gets together: Not on file    Attends religious service: Not on file    Active member of club or organization: Not on file    Attends meetings of clubs or organizations: Not on file    Relationship status: Not on file  Other Topics Concern  . Not on file  Social History Narrative  . Not on file    Hospital Course:    Patient was admitted under routine  precautions displayed no danger behaviors here he reported a reduction in his hallucinations at the point of discharge reported he only had a few of them in the context of drifting off to sleep and hearing music so these would probably just be categorized as hypnagogic hallucinations.  At any rate we also took the opportunity to discontinue stimulants as we thought that might be worsening his psychosis, we also discontinued opiates and monitored for withdrawal, and cut his doses of benzodiazepines in half and all these measures prove to have a more of an awakening in fact he became more engaged and alert so we think were on the right track with his meds also spread out his Trilafon without changing the dose. So the time of discharge is stable in mood and affect denying current auditory visual hallucinations or thoughts of harming self or others no cravings tremors or withdrawal symptoms blood pressure stable  Physical Findings: AIMS: Facial and Oral Movements Muscles of Facial Expression: None, normal Lips and Perioral Area: None, normal Jaw: None, normal Tongue: None, normal,Extremity Movements Upper (arms, wrists, hands, fingers): None, normal Lower (legs, knees, ankles, toes): None, normal, Trunk Movements Neck, shoulders, hips: None, normal, Overall Severity Severity of abnormal movements (highest score from questions above): None, normal Incapacitation due to abnormal movements: None, normal Patient's awareness of abnormal movements (rate only patient's report): No Awareness, Dental Status Current problems with teeth and/or dentures?: No Does patient usually wear dentures?: No  CIWA:    COWS:     Musculoskeletal: Strength & Muscle Tone: within normal limits Gait & Station: normal Patient leans: N/A  Psychiatric Specialty Exam: ROS  Blood pressure 106/82, pulse 76, temperature 97.9 F (36.6 C), temperature source Oral, resp. rate 20, height 6\' 2"  (1.88 m), weight 119.7 kg, SpO2 97  %.Body mass index is 33.9 kg/m.  General Appearance: Casual  Eye Contact::  Good  Speech:  Clear and Coherent409  Volume:  Normal  Mood:  Euthymic  Affect:  Full Range  Thought Process:  Coherent and Descriptions of Associations: Intact  Orientation:  Full (Time, Place, and Person)  Thought Content:  Logical  Suicidal Thoughts:  No  Homicidal Thoughts:  No  Memory:  Immediate;   Fair  Judgement:  Intact  Insight:  Fair  Psychomotor Activity:  Normal  Concentration:  nl  Recall:  Good  Fund of Knowledge:Good  Language: Good  Akathisia:  Negative  Handed:  Right  AIMS (if indicated):     Assets:  Communication Skills Desire for Improvement Housing Leisure Time  Sleep:  Number of Hours: 4.25  Cognition: WNL  ADL's:  Intact    Have you used any form of tobacco in the last 30 days? (Cigarettes, Smokeless Tobacco, Cigars, and/or Pipes): Yes  Has this patient used any form of tobacco in the last 30 days? (Cigarettes, Smokeless Tobacco, Cigars, and/or Pipes) Yes, No  Blood Alcohol level:  Lab Results  Component Value Date   ETH <5 12/29/2014    Metabolic Disorder Labs:  Lab Results  Component Value Date   HGBA1C 6.4 (H) 10/21/2018   MPG 136.98 10/21/2018   MPG 137 02/10/2015   Lab Results  Component Value Date   PROLACTIN 52.5 (H) 02/10/2015   Lab Results  Component Value Date   CHOL 278 (H) 10/21/2018   TRIG 571 (H) 10/21/2018   HDL 28 (L) 10/21/2018   CHOLHDL 9.9 10/21/2018   VLDL UNABLE TO CALCULATE IF TRIGLYCERIDE OVER 400 mg/dL 81/19/1478   LDLCALC UNABLE TO CALCULATE IF TRIGLYCERIDE OVER 400 mg/dL 29/56/2130   LDLCALC 865 (H) 02/10/2015    See Psychiatric Specialty Exam and Suicide Risk Assessment completed by Attending Physician prior to discharge.  Discharge destination:  Home  Is patient on multiple antipsychotic therapies at discharge:  No   Has Patient had three or more failed trials of antipsychotic monotherapy by history:  No  Recommended  Plan for Multiple Antipsychotic Therapies: NA   Allergies as of 10/23/2018      Reactions   Gluten Meal Other (See Comments)   Celiac disease   Trazodone And Nefazodone       Medication List    STOP taking these medications   amphetamine-dextroamphetamine 30 MG tablet Commonly known as:  ADDERALL   gabapentin 600 MG tablet Commonly known as:  NEURONTIN Replaced by:  gabapentin 300 MG capsule   oxyCODONE 5 MG immediate release tablet Commonly known as:  Oxy IR/ROXICODONE   tiZANidine 4 MG capsule Commonly known as:  Zanaflex     TAKE these medications     Indication  atenolol 100 MG tablet Commonly known as:  TENORMIN Take 1 tablet (100 mg total) by mouth daily after breakfast.  Indication:  High Blood Pressure Disorder, Generalized Anxiety Disorder   benztropine 1 MG tablet Commonly known as:  COGENTIN Take 1 tablet (1 mg total) by mouth 2 (two) times daily.  Indication:  Extrapyramidal Reaction caused by Medications   clonazePAM 0.5 MG tablet Commonly known as:  KLONOPIN Take 1 tablet (0.5 mg total) by mouth 3 (three) times daily as needed (anxiety). What changed:    medication strength  how much to take  reasons to take this  Indication:  Manic-Depression, generalized anxiety   DULoxetine 60 MG capsule Commonly known as:  CYMBALTA TAKE 1 CAPSULE BY MOUTH TWICE A DAY FOR DEPRESSION What changed:  See the new instructions.  Indication:  Major Depressive Disorder   fluticasone 50 MCG/ACT nasal spray Commonly known as:  FLONASE Place 1 spray into both nostrils as needed for allergies or rhinitis.  Indication:  Perennial Allergic Rhinitis, Hayfever   gabapentin 300 MG capsule Commonly known as:  NEURONTIN Take 1 capsule (300 mg total) by mouth 3 (three) times daily. Replaces:  gabapentin 600 MG tablet  Indication:  Neuropathic Pain   lithium carbonate 300 MG CR tablet Commonly known as:  LITHOBID Take 2 tablets (600 mg total) by mouth at bedtime.   Indication:  Hypomanic Episode of Bipolar Disorder, Schizoaffective Disorder   nicotine polacrilex 2 MG gum Commonly known as:  NICORETTE Take 1 each (2 mg total) by mouth as needed for smoking cessation.  Indication:  Nicotine Addiction   perphenazine 16 MG tablet Commonly known as:  TRILAFON Take 1 tablet (16 mg  total) by mouth 2 (two) times daily. What changed:    how much to take  when to take this  Indication:  Schizophrenia, Schizoaffective disorder   trazodone 300 MG tablet Commonly known as:  DESYREL Take 1 tablet (300 mg total) by mouth at bedtime as needed for sleep. What changed:    medication strength  how much to take  Indication:  Anxiety Disorder, Trouble Sleeping      Follow-up Information    [facility] Follow up on 10/28/2018.   Specialty:  Behavioral Health Why:  Telephonic medication management appointment with Dr. Marlyne Beards is Tuesday, 6/2 at 2:20p.  Contact information: [address] [place] [address] [phone]        [facility] Follow up.   Contact information: [address] [place] [address] [phone]             SignedMalvin Johns, MD 10/23/2018, 9:56 AM

## 2018-10-28 ENCOUNTER — Ambulatory Visit: Payer: Medicaid Other | Admitting: Psychiatry

## 2018-10-28 ENCOUNTER — Other Ambulatory Visit: Payer: Self-pay | Admitting: Psychiatry

## 2018-10-28 ENCOUNTER — Other Ambulatory Visit: Payer: Self-pay

## 2018-10-28 ENCOUNTER — Encounter: Payer: Self-pay | Admitting: Psychiatry

## 2018-10-28 VITALS — Ht 74.0 in | Wt 262.3 lb

## 2018-10-28 DIAGNOSIS — F902 Attention-deficit hyperactivity disorder, combined type: Secondary | ICD-10-CM

## 2018-10-28 DIAGNOSIS — F411 Generalized anxiety disorder: Secondary | ICD-10-CM

## 2018-10-28 DIAGNOSIS — G4733 Obstructive sleep apnea (adult) (pediatric): Secondary | ICD-10-CM

## 2018-10-28 DIAGNOSIS — G2581 Restless legs syndrome: Secondary | ICD-10-CM | POA: Insufficient documentation

## 2018-10-28 DIAGNOSIS — F250 Schizoaffective disorder, bipolar type: Secondary | ICD-10-CM

## 2018-10-28 MED ORDER — BENZTROPINE MESYLATE 1 MG PO TABS: 1.0000 mg | ORAL_TABLET | Freq: Two times a day (BID) | ORAL | 2 refills | Status: DC

## 2018-10-28 MED ORDER — LITHIUM CARBONATE ER 300 MG PO TBCR: 600.0000 mg | EXTENDED_RELEASE_TABLET | Freq: Every day | ORAL | 2 refills | Status: DC

## 2018-10-28 MED ORDER — AMPHETAMINE-DEXTROAMPHETAMINE 15 MG PO TABS: 15.0000 mg | ORAL_TABLET | Freq: Two times a day (BID) | ORAL | 0 refills | Status: DC

## 2018-10-28 MED ORDER — GABAPENTIN 300 MG PO CAPS: 300.0000 mg | ORAL_CAPSULE | Freq: Three times a day (TID) | ORAL | 2 refills | Status: DC

## 2018-10-28 MED ORDER — PERPHENAZINE 16 MG PO TABS: 16.0000 mg | ORAL_TABLET | Freq: Two times a day (BID) | ORAL | 2 refills | Status: DC

## 2018-10-28 MED ORDER — DULOXETINE HCL 60 MG PO CPEP: 60.0000 mg | ORAL_CAPSULE | Freq: Two times a day (BID) | ORAL | 2 refills | Status: DC

## 2018-10-28 MED ORDER — CLONAZEPAM 1 MG PO TABS: 1.0000 mg | ORAL_TABLET | Freq: Two times a day (BID) | ORAL | 2 refills | Status: DC | PRN

## 2018-10-28 MED ORDER — TRAZODONE HCL 100 MG PO TABS: 200.0000 mg | ORAL_TABLET | Freq: Every evening | ORAL | 2 refills | Status: DC | PRN

## 2018-10-28 MED ORDER — ATENOLOL 100 MG PO TABS: 100.0000 mg | ORAL_TABLET | Freq: Every day | ORAL | 2 refills | Status: DC

## 2018-10-28 NOTE — Progress Notes (Signed)
[facility]  Patient ID: Matthew [name],  MRN: [id number]  PCP: Henrine Screwshacker, Robert, MD  Date of Evaluation: 10/28/2018 Time spent:20 minutes from 1440 to 1500  Chief Complaint:   HISTORY/CURRENT STATUS: Matthew [name] is phoned for telemedicine appointment he requested as aftercare from acute inpatient treatment at [facility] 6/2-08/2018 but when reached on the phone he is in route to this office stating he wants face-to-face appointment which is therefore 20 minutes late on site with consent with hospital collateral for psychiatric interview and exam in 335-month evaluation and management of schizoaffective depressed, generalized anxiety, obstructive sleep apnea, and ADHD.  Patient was hospitalized with depressive symptoms with suicidal ideation as he obsessed about the involution of his adult life verifying in the hospital that the voices were in his head as though self talk rather than outside his head as though auditory hallucinations.  The psychiatrist did not change his perphenazine but added benztropine, stopped Adderall, and decreased Klonopin and gabapentin by 50% in case the patient was overstimulated in his psychosis, sedated from his medications, or had parkinsonian tremor instead of action tremor from his medications.  Triglycerides were very elevated along with hemoglobin A1c being 6.4% while cholesterol moderately elevated.  The patient concludes today that the hospitalization was of no benefit.  He has adult dilemma of no employment as he has been declined disability SSI for the second time and has no physical or social activity except [facility].  Though he was to be considered for Medicaid therapist by this office per aftercare, he concludes that [facility] best for his needs and he currently declines Medicaid covered care elsewhere due to what he considers the poor quality.  He filled his past escription 10/23/2018 per [place] registry after hospital release  from his last office visit here but I attempt to gain his understanding in reducing the dose 50% while raising the Klonopin slightly.  He drives mother's car though parents note that he is careless in doing so needing Adderall for focus. He continues disappointment expecting parents to require him to leave any time even though he thinks they need their current residence currently where he resides instead of definitely moving away.  He considered himself allergic to nefazodone and possibly trazodone in the past, but he is pleased to have his trazodone currently helping his sleep.  He is taking 200 mg rather than 300 mg at bedtime of the trazodone.  He states that he requested an EKG in the hospital for history of first-degree AV block but they would not provide it.  He has not become active in outpatient primary care for his metabolic concerns and cannot survive without his antipsychotic.  He is not acutely psychotic at this time nor actively suicidal.  Depression       The patient presents with depression.  This is a recurrent problem.  The current episode started more than 1 month ago.   The onset quality is sudden.   The problem occurs intermittently.  The problem has been rapidly decompensating  [name] now better after hospital stay.  Associated symptoms include decreased concentration, fatigue, hopelessness, irritable, decreased interest and sad.  Associated symptoms include no helplessness, does not have insomnia, no appetite change, no headaches, no indigestion and no suicidal ideas today.     The symptoms are aggravated by family issues and social issues.  Past treatments include SSRIs - Selective serotonin reuptake inhibitors, SNRIs - Serotonin and norepinephrine reuptake inhibitors, other medications and psychotherapy.  Compliance with treatment is variable and good.  Past compliance problems include difficulty with treatment plan, medication issues, medical issues and difficulty understanding directions.   Previous treatment provided mild relief.  Risk factors include a change in medication usage/dosage, family history, family history of mental illness, history of mental illness, history of self-injury, history of suicide attempt, major life event, prior psychiatric admission, stress and the patient not taking medications correctly.   Past medical history includes physical disability, recent psychiatric admission, anxiety, bipolar disorder, depression, schizophrenia and suicide attempts.     Pertinent negatives include no brain trauma, no eating disorder, no mental health disorder, no obsessive-compulsive disorder, no post-traumatic stress disorder and no head trauma.  Individual Medical History/ Review of Systems: Changes? :Yes Inpatient June 2, hemoglobin A1c was 6.4%, triglyceride 571, cholesterol 278, and HDL cholesterol 28 mg/dL.  Last EKG had first-degree heart block.  Allergies: Gluten meal and Trazodone and nefazodone  Current Medications:  Current Outpatient Medications:  .  amphetamine-dextroamphetamine (ADDERALL) 15 MG tablet, Take 1 tablet by mouth 2 (two) times daily for 30 days., Disp: 60 tablet, Rfl: 0 .  [START ON 12/22/2018] amphetamine-dextroamphetamine (ADDERALL) 15 MG tablet, Take 1 tablet by mouth 2 (two) times daily for 30 days., Disp: 60 tablet, Rfl: 0 .  atenolol (TENORMIN) 100 MG tablet, Take 1 tablet (100 mg total) by mouth daily after breakfast., Disp: 30 tablet, Rfl: 2 .  benztropine (COGENTIN) 1 MG tablet, Take 1 tablet (1 mg total) by mouth 2 (two) times daily., Disp: 60 tablet, Rfl: 2 .  clonazePAM (KLONOPIN) 1 MG tablet, Take 1 tablet (1 mg total) by mouth 2 (two) times daily as needed for anxiety (anxiety)., Disp: 60 tablet, Rfl: 2 .  DULoxetine (CYMBALTA) 60 MG capsule, Take 1 capsule (60 mg total) by mouth 2 (two) times daily., Disp: 60 capsule, Rfl: 2 .  fluticasone (FLONASE) 50 MCG/ACT nasal spray, Place 1 spray into both nostrils as needed for allergies or rhinitis.  (Patient not taking: Reported on 10/21/2018), Disp: , Rfl: 2 .  gabapentin (NEURONTIN) 300 MG capsule, Take 1 capsule (300 mg total) by mouth 3 (three) times daily., Disp: 90 capsule, Rfl: 2 .  lithium carbonate (LITHOBID) 300 MG CR tablet, Take 2 tablets (600 mg total) by mouth at bedtime., Disp: 60 tablet, Rfl: 2 .  nicotine polacrilex (NICORETTE) 2 MG gum, Take 1 each (2 mg total) by mouth as needed for smoking cessation. (Patient not taking: Reported on 10/21/2018), Disp: 100 tablet, Rfl: 0 .  perphenazine (TRILAFON) 16 MG tablet, Take 1 tablet (16 mg total) by mouth 2 (two) times daily., Disp: 60 tablet, Rfl: 2 .  trazodone (DESYREL) 100 MG tablet, Take 2 tablets (200 mg total) by mouth at bedtime as needed for sleep., Disp: 60 tablet, Rfl: 2   Medication Side Effects: none denies though theoretical list above clarifies possibilities to monitor  Family Medical/ Social History: Changes? Yes both parents have prediabetes and hypertension and patient has prediabetes on the order for diabetes.  MENTAL HEALTH EXAM:  There were no vitals taken for this visit.There is no height or weight on file to calculate BMI.  Weight 5 days ago 262 pounds with height 74 inches  General Appearance: Disheveled, Guarded and Obese  Eye Contact:  Minimal  Speech:  Clear and Coherent, Slow and Slurred  Volume:  Normal  Mood:  Anxious, Depressed, Hopeless, Irritable and Worthless  Affect:  Blunt, Non-Congruent, Constricted, Depressed, Labile, Restricted and Anxious  Thought Process:  Disorganized,  Irrelevant and Linear  Orientation:  Full (Time, Place, and Person)  Thought Content: Ilusions, Obsessions, Paranoid Ideation and Rumination   Suicidal Thoughts:  Yes.  without intent/plan recent now resolved  Homicidal Thoughts:  No  Memory:  Immediate;   Poor Remote;   Fair  Judgement:  Impaired  Insight:  Lacking  Psychomotor Activity:  Normal, Decreased, Mannerisms and Psychomotor Retardation  Concentration:   Concentration: Poor and Attention Span: Poor  Recall:  Fair  Fund of Knowledge: Poor  Language: Poor  Assets:  Housing Resilience Transportation  ADL's:  Intact  Cognition: Impaired,  Mild  Prognosis:  Poor    DIAGNOSES:    ICD-10-CM   1. Schizoaffective disorder, bipolar type without good prognostic features (HCC) F25.0 perphenazine (TRILAFON) 16 MG tablet    lithium carbonate (LITHOBID) 300 MG CR tablet    clonazePAM (KLONOPIN) 1 MG tablet    benztropine (COGENTIN) 1 MG tablet  2. Generalized anxiety disorder F41.1 trazodone (DESYREL) 100 MG tablet    gabapentin (NEURONTIN) 300 MG capsule    DULoxetine (CYMBALTA) 60 MG capsule    clonazePAM (KLONOPIN) 1 MG tablet    atenolol (TENORMIN) 100 MG tablet  3. Attention deficit hyperactivity disorder (ADHD), combined type, moderate F90.2 DULoxetine (CYMBALTA) 60 MG capsule    amphetamine-dextroamphetamine (ADDERALL) 15 MG tablet    amphetamine-dextroamphetamine (ADDERALL) 15 MG tablet  4. OSA (obstructive sleep apnea) G47.33   5. Schizoaffective disorder, bipolar type (HCC) F25.0 trazodone (DESYREL) 100 MG tablet    Receiving Psychotherapy: Yes  [facility] with option to transfer all care including [facility]   RECOMMENDATIONS: Trazodone can be continued as he is not allergic though he is willing to use the 200 instead of the 300 mg strength.  He accepts the reduction in gabapentin with slight increase in Klonopin for a moderate decrease in Adderall to follow.  His benztropine will be continued well as his atenolol. Is therefore E scribed Adderall 15 mg IR twice daily as #60 each for July 4 and August 3 for ADHD, binge eating, and depression.  Atenolol is continued 100 mg every morning for tremor and anxiety sent as #30 with 2 refills.  Benztropine is sent as 1 mg twice daily #60 with 2 refills for EPS.  Cymbalta 60 mg twice daily is sent as #60 with 2 refills sent for depression and anxiety as  well as ADHD.  Lithium is E scribed as 300 mg ER taking 2 every bedtime quantity #60 with 2 refills for depression.  [name] 16 mg twice daily #60 with 2 refills is sent for schizoaffective disorder.  Trazodone 100 mg a sent as 2 every bedtime #60 with [facility] [address] as are all the above escriptions We process all aspects of daily life most important being to move exercise activity and reduce appetite rehabilitate and follow-up with primary care for mood reduction and glucose control as weight control continues.  He may return in 3 months for follow-up if he insists though attempting to facilitate his ability to secure supervised living arrangements completing the form for him today as to the physician part, [facility] for rehab, and psychiatric and counseling resources that can integrate with social work and primary care in the community. Chauncey MannGlenn E Jennings, MD

## 2018-11-19 ENCOUNTER — Other Ambulatory Visit: Payer: Self-pay | Admitting: Psychiatry

## 2018-11-19 DIAGNOSIS — F411 Generalized anxiety disorder: Secondary | ICD-10-CM

## 2018-11-19 DIAGNOSIS — F250 Schizoaffective disorder, bipolar type: Secondary | ICD-10-CM

## 2018-11-19 DIAGNOSIS — F902 Attention-deficit hyperactivity disorder, combined type: Secondary | ICD-10-CM

## 2018-11-25 ENCOUNTER — Telehealth: Payer: Self-pay | Admitting: Psychiatry

## 2018-11-25 NOTE — Telephone Encounter (Signed)
Staff sends message to me to send in Adderall eScription for July when patient already has that for July 4 and August 3 on file the computer at [place].  It is left for the patient to that effect that he must request from the pharmacy the E scription on file for him for July.

## 2018-11-25 NOTE — Telephone Encounter (Signed)
Patient called and said that he needs a refill on his adderall 15 mg [facility] [address]

## 2018-11-26 NOTE — Telephone Encounter (Signed)
[facility]'t [facility] for July.  I see June and August in [place] but I don't see July either.  Please verify what has been sent.  If [facility], please call them to straighten it out.  If it is not there, please send one in for July

## 2018-11-27 ENCOUNTER — Other Ambulatory Visit: Payer: Self-pay

## 2018-11-27 DIAGNOSIS — F902 Attention-deficit hyperactivity disorder, combined type: Secondary | ICD-10-CM

## 2018-11-27 MED ORDER — AMPHETAMINE-DEXTROAMPHETAMINE 15 MG PO TABS: 15.0000 mg | ORAL_TABLET | Freq: Two times a day (BID) | ORAL | 0 refills | Status: DC

## 2018-11-27 NOTE — Telephone Encounter (Signed)
Resubmission of Adderall today is necessary for interim dating variation entry error.

## 2018-11-27 NOTE — Telephone Encounter (Signed)
At appointment 10/28/2018, the patient had filled only 2 of the 3 prescriptions for Adderall from 09/02/2018 appointment anticipating he would fill the remaining Adderall in their system first before the next 2 prescriptions designated for July and August therefore.  As the 10/28/2018 Adderall was filled that day and not the one from 09/02/2018, will [facility] a [facility].

## 2018-12-22 ENCOUNTER — Other Ambulatory Visit: Payer: Self-pay

## 2018-12-22 ENCOUNTER — Encounter: Payer: Self-pay | Admitting: Psychiatry

## 2018-12-22 ENCOUNTER — Ambulatory Visit (INDEPENDENT_AMBULATORY_CARE_PROVIDER_SITE_OTHER): Payer: Medicaid Other | Admitting: Psychiatry

## 2018-12-22 VITALS — Ht 74.0 in | Wt 278.0 lb

## 2018-12-22 DIAGNOSIS — G4733 Obstructive sleep apnea (adult) (pediatric): Secondary | ICD-10-CM

## 2018-12-22 DIAGNOSIS — F902 Attention-deficit hyperactivity disorder, combined type: Secondary | ICD-10-CM

## 2018-12-22 DIAGNOSIS — F250 Schizoaffective disorder, bipolar type: Secondary | ICD-10-CM

## 2018-12-22 DIAGNOSIS — F411 Generalized anxiety disorder: Secondary | ICD-10-CM

## 2018-12-22 MED ORDER — AMPHETAMINE-DEXTROAMPHETAMINE 30 MG PO TABS: 30.0000 mg | ORAL_TABLET | Freq: Two times a day (BID) | ORAL | 0 refills | Status: DC

## 2018-12-22 MED ORDER — TRAZODONE HCL 100 MG PO TABS: 300.0000 mg | ORAL_TABLET | Freq: Every evening | ORAL | 0 refills | Status: DC | PRN

## 2018-12-22 NOTE — Progress Notes (Signed)
[facility]  Patient ID: Matthew [name],  MRN: [id number]  PCP: Henrine Screwshacker, Robert, MD  Date of Evaluation: 12/22/2018 Time spent:20 minutes from 1615 to 1635  Chief Complaint:  Chief Complaint    Schizophrenia; Depression; Anxiety; ADHD      HISTORY/CURRENT STATUS: Matthew [name] is seen onsite in office face-to-face individually with consent with epic collateral for psychiatric interview and exam in 7-week evaluation and management of schizoaffective bipolar, generalized anxiety, and ADHD, arriving 15 minutes late for appointment being 1 month early for follow-up.  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  The [facility] phoned him to assure his wellbeing and intent to return to their program when he has been busy with these other affairs.  At his last hospitalization when he complained of auditory hallucinations, they stopped his Adderall which he filled as soon as he got out but excepted last appointment 50% reduction in dose to limit any psychotic symptom exacerbation from the stimulant.  Though he has no hallucinations in the interim, he has great difficulty initiating and completing responsibility stating instead that he seems to have poor concentration, laziness, inertia, and disorganization.  He states he is completing the third packet of paperwork for [facility] as they now require his commitment to keeping up the grounds there, two such packets having been partially completed through this office by me.  He is also not sleeping well and request to restore his trazodone back to the 300 mg dose.  He takes his benztropine erratically so that  with these other adjustments made, we will stop the benztropine started in the hospital as he is having no EPS.  [place] registry documents last Adderall fill 11/27/2018 being appropriate dose at that time 15 mg IR.  He has significant weight gain back to 278 pounds stating his brother had a birthday cake and his sister soon has another having a hard time stopping sugar nowrestarting cigarettes after 3 weeks off cigarettes.  He reviews all the metabolic labs from last hospital stay including hemoglobin A1c 6.3% with triglycerides 571 and LDL cholesterol elevated.  He continues  perphenazine at a dose which can contain psychotic symptoms but hopefully as a first generation have less metabolic consequences.  He is not hallucinating today, manic, suicidal, or aggressive but he is overwhelmed and ineffective even for the assistance provided from [facility] and [facility] toward housing, work, and community life again.  He has court soon for legal contending with the rejection again for disability.  Depression        The patient presents withdepression a recurrentproblem.The current episode started more than 1 month ago. The onset quality is sudden. The problem occurs intermittently.The problem is now  gradually improving but overall waxing and waning since hospital stay.Associated symptoms include  Insomnia, helplessness, decreased concentration,fatigue,hopelessness,irritable,decreased interestand sad. Associated symptoms include,no appetite change,no headaches,no indigestionand no suicidal ideas today.The symptoms are aggravated by family issues and social issues.Past treatments include SSRIs - Selective serotonin reuptake inhibitors, SNRIs - Serotonin and norepinephrine reuptake inhibitors, other medications and psychotherapy.Compliance with treatment is variable and good.Past compliance problems include difficulty with treatment plan, medication issues, medical issues and difficulty  understanding directions.Previous  treatment provided mildrelief.Risk factors include a change in medication usage/dosage, family history, family history of mental illness, history of mental illness, history of self-injury, history of suicide attempt, major life event, prior psychiatric admission, stress and the patient not taking medications correctly. Past medical history includes physical disability,recent psychiatric admission,anxiety,bipolar disorder,depression,schizophreniaand suicide attempts. Pertinent negatives include no brain trauma,no eating disorder,no mental health disorder,no obsessive-compulsive disorder,no post-traumatic stress disorderand no head trauma.  Individual Medical History/ Review of Systems: Changes? :Yes metabolic syndrome symptoms and findings are more inherent to his psychopathology than its treatment.  Allergies: Gluten meal and Trazodone and nefazodone  Current Medications:  Current Outpatient Medications:  .  amphetamine-dextroamphetamine (ADDERALL) 30 MG tablet, Take 1 tablet by mouth 2 (two) times daily., Disp: 60 tablet, Rfl: 0 .  [START ON 01/21/2019] amphetamine-dextroamphetamine (ADDERALL) 30 MG tablet, Take 1 tablet by mouth 2 (two) times daily., Disp: 60 tablet, Rfl: 0 .  atenolol (TENORMIN) 100 MG tablet, Take 1 tablet (100 mg total) by mouth daily after breakfast., Disp: 30 tablet, Rfl: 2 .  clonazePAM (KLONOPIN) 1 MG tablet, Take 1 tablet (1 mg total) by mouth 2 (two) times daily as needed for anxiety (anxiety)., Disp: 60 tablet, Rfl: 2 .  DULoxetine (CYMBALTA) 60 MG capsule, TAKE 1 CAPSULE (60 MG TOTAL) BY MOUTH 2 (TWO) TIMES DAILY., Disp: 180 capsule, Rfl: 0 .  fluticasone (FLONASE) 50 MCG/ACT nasal spray, Place 1 spray into both nostrils as needed for allergies or rhinitis. (Patient not taking: Reported on 10/21/2018), Disp: , Rfl: 2 .  gabapentin (NEURONTIN) 300 MG capsule, TAKE 1 CAPSULE BY MOUTH THREE TIMES A DAY, Disp: 270 capsule,  Rfl: 0 .  lithium carbonate (LITHOBID) 300 MG CR tablet, Take 2 tablets (600 mg total) by mouth at bedtime., Disp: 60 tablet, Rfl: 2 .  nicotine polacrilex (NICORETTE) 2 MG gum, Take 1 each (2 mg total) by mouth as needed for smoking cessation. (Patient not taking: Reported on 10/21/2018), Disp: 100 tablet, Rfl: 0 .  perphenazine (TRILAFON) 16 MG tablet, Take 1 tablet (16 mg total) by mouth 2 (two) times daily., Disp: 60 tablet, Rfl: 2 .  traZODone (DESYREL) 100 MG tablet, Take 3 tablets (300 mg total) by mouth at bedtime as needed for sleep., Disp: 270 tablet, Rfl: 0   Medication Side Effects: anxiety, insomnia and weight gain  Family Medical/ Social History: Changes? No  MENTAL HEALTH EXAM:  Height 6\' 2"  (1.88 m), weight 278 lb (126.1 kg).Body mass index is 35.69 kg/m.  Others deferred as nonessential in coronavirus pandemic  General Appearance: Bizarre, Casual, Disheveled, Guarded and Obese  Eye Contact:  Fair  Speech:  Blocked, Clear and Coherent and Slow  Volume:  Normal  Mood:  Anxious, Depressed, Dysphoric, Irritable and Worthless  Affect:  Non-Congruent, Depressed, Inappropriate and Anxious  Thought Process:  Disorganized, Irrelevant and Linear  Orientation:  Full (Time, Place, and Person)  Thought Content: Ilusions, Obsessions, Paranoid Ideation and Rumination   Suicidal Thoughts:  No  Homicidal Thoughts:  No  Memory:  Immediate;   Fair Remote;   Fair  Judgement:  Impaired  Insight:  Lacking  Psychomotor Activity:  Decreased, Mannerisms and Psychomotor Retardation and restlessness  Concentration:  Concentration: Poor and Attention Span: Poor  Recall:  [employer]  Fund of Knowledge: Fair  Language: Fair  Assets:  Desire for Improvement Financial Resources/Insurance Housing by [employer] programming of [facility], [facility], monitor  ADL's:  Intact  Cognition: WNL  Prognosis:  Poor    DIAGNOSES:  ICD-10-CM   1. Schizoaffective disorder, bipolar type without  good prognostic features ([place])  F25.0 traZODone (DESYREL) 100 MG tablet  2. Generalized anxiety disorder  F41.1 traZODone (DESYREL) 100 MG tablet  3. Attention deficit hyperactivity disorder (ADHD), combined type, moderate  F90.2 amphetamine-dextroamphetamine (ADDERALL) 30 MG tablet    amphetamine-dextroamphetamine (ADDERALL) 30 MG tablet  4. 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  Trazodone is increased 100 mg tablet to 3 tablets total 300 mg every bedtime sent as #270 with no refill to [facility] [address] for insomnia.  Benztropine is discontinued.  Otherwise he continues current supply of lithium carbonate 300 mg ER 2 every bedtime for schizoaffective bipolar, perphenazine 16 mg twice daily for schizoaffective bipolar, and duloxetine 60 mg twice daily for his schizoaffective bipolar, generalized anxiety, and ADHD.  He has gabapentin 300 mg 3 times daily for anxiety and chronic pain current supply, clonazepam 1 mg twice daily as needed for anxiety, and atenolol 100 mg daily after breakfast for anxiety and metabolic syndrome symptoms.  He returns in 2 months for follow-up.   Delight Hoh, MD

## 2019-01-13 ENCOUNTER — Other Ambulatory Visit: Payer: Self-pay | Admitting: Psychiatry

## 2019-01-13 DIAGNOSIS — F250 Schizoaffective disorder, bipolar type: Secondary | ICD-10-CM

## 2019-01-13 NOTE — Telephone Encounter (Signed)
Last appointment 12/22/2018 considered existing supply at home of lithium and perphenazine now needing renewal sending a 90-day supply and 1 refill of each as perphenazine 16 mg twice daily and lithium 300 mg ER taking 2 nightly medically necessary with no contraindication [facility] [address]

## 2019-01-13 NOTE — Telephone Encounter (Signed)
90 day request okay? 

## 2019-01-22 ENCOUNTER — Other Ambulatory Visit: Payer: Self-pay | Admitting: Psychiatry

## 2019-01-22 DIAGNOSIS — F250 Schizoaffective disorder, bipolar type: Secondary | ICD-10-CM

## 2019-01-28 ENCOUNTER — Ambulatory Visit: Payer: Medicaid Other | Admitting: Psychiatry

## 2019-02-02 ENCOUNTER — Other Ambulatory Visit: Payer: Self-pay | Admitting: Psychiatry

## 2019-02-02 DIAGNOSIS — F411 Generalized anxiety disorder: Secondary | ICD-10-CM

## 2019-02-02 DIAGNOSIS — F250 Schizoaffective disorder, bipolar type: Secondary | ICD-10-CM

## 2019-02-03 NOTE — Telephone Encounter (Signed)
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

## 2019-02-03 NOTE — Telephone Encounter (Signed)
appt 10/05 

## 2019-02-10 ENCOUNTER — Other Ambulatory Visit: Payer: Self-pay | Admitting: Psychiatry

## 2019-02-10 DIAGNOSIS — F250 Schizoaffective disorder, bipolar type: Secondary | ICD-10-CM

## 2019-02-10 DIAGNOSIS — F411 Generalized anxiety disorder: Secondary | ICD-10-CM

## 2019-02-10 DIAGNOSIS — F902 Attention-deficit hyperactivity disorder, combined type: Secondary | ICD-10-CM

## 2019-02-20 ENCOUNTER — Other Ambulatory Visit: Payer: Self-pay

## 2019-02-20 ENCOUNTER — Telehealth: Payer: Self-pay | Admitting: Psychiatry

## 2019-02-20 DIAGNOSIS — F902 Attention-deficit hyperactivity disorder, combined type: Secondary | ICD-10-CM

## 2019-02-20 MED ORDER — AMPHETAMINE-DEXTROAMPHETAMINE 30 MG PO TABS: 30.0000 mg | ORAL_TABLET | Freq: Two times a day (BID) | ORAL | 0 refills | Status: DC

## 2019-02-20 NOTE — Telephone Encounter (Signed)
Pt called again 10/2 for refill on Adderall. Has appt 10/5

## 2019-02-20 NOTE — Telephone Encounter (Signed)
After last appointment 12/22/2018, the second fill of Adderall is exhausted before return appointment next Monday, 02/23/2019 sending eScription #60 of the 30 mg IR twice daily medically necessary no contraindication [facility] [address] ADHD.

## 2019-02-20 NOTE — Telephone Encounter (Signed)
Pended for provider

## 2019-02-20 NOTE — Telephone Encounter (Signed)
Patient left vm 10/01/ @4 :10 requesting a refill on [facility] [address] patient has appointment 10/05 w/GJ

## 2019-02-23 ENCOUNTER — Encounter: Payer: Self-pay | Admitting: Psychiatry

## 2019-02-23 ENCOUNTER — Other Ambulatory Visit: Payer: Self-pay

## 2019-02-23 ENCOUNTER — Ambulatory Visit (INDEPENDENT_AMBULATORY_CARE_PROVIDER_SITE_OTHER): Payer: Medicaid Other | Admitting: Psychiatry

## 2019-02-23 VITALS — Ht 74.0 in | Wt 280.0 lb

## 2019-02-23 DIAGNOSIS — F411 Generalized anxiety disorder: Secondary | ICD-10-CM

## 2019-02-23 DIAGNOSIS — G4733 Obstructive sleep apnea (adult) (pediatric): Secondary | ICD-10-CM

## 2019-02-23 DIAGNOSIS — F250 Schizoaffective disorder, bipolar type: Secondary | ICD-10-CM

## 2019-02-23 DIAGNOSIS — F902 Attention-deficit hyperactivity disorder, combined type: Secondary | ICD-10-CM

## 2019-02-23 MED ORDER — AMPHETAMINE-DEXTROAMPHETAMINE 30 MG PO TABS: 30.0000 mg | ORAL_TABLET | Freq: Two times a day (BID) | ORAL | 0 refills | Status: DC

## 2019-02-23 NOTE — Progress Notes (Signed)
[facility]  Patient ID: Matthew [name],  MRN: [id number]  PCP: Matthew Screws, MD  Date of Evaluation: 02/23/2019 Time spent:10 minutes from 1530 to 1540  Chief Complaint:  Chief Complaint    Schizophrenia; Depression; Manic Behavior; Anxiety; ADHD      HISTORY/CURRENT STATUS: Matthew [name] is seen onsite in office 10 minutes face-to-face individually with consent with epic collateral for psychiatric interview and exam in evaluation and management of primarily has ADHD though comorbid with schizoaffective bipolar, generalized anxiety, and sleep apnea.  He arrives 10 minutes late for appointment emphasizing need only for his Adderall eScription's having all of his established medications still wishing care here despite his Medicaid funding and working with [employer] and [employer] in the community on his new move to the [employer] and job seeking.  He has not passed his CT certification that I can determine.  He has not had exacerbation of auditory hallucinations with the reestablishment of his Adderall dosing to 30 mg after being stopped inpatient and restarted by patient after his last hospitalization when having hallucinations.  During this transition time, he has been eating fast food and gained 2 pounds despite his metabolic disorder in his last hospitalization.Marland Kitchen  He is hoping for work with Matthew [name] as he feels excluded by [facility] and [facility] from previous employment.  He has no delirium, acute psychosis or mania, or suicidality today but continues tobacco.   Individual Medical History/ Review of Systems: Changes? :Yes His greatest concern is Peyronie's he newly discusses establishing plan to seek primary care referral to urology  Allergies: Gluten meal and Trazodone and nefazodone  Current Medications:  Current Outpatient Medications:  .  [START ON 03/22/2019] amphetamine-dextroamphetamine (ADDERALL) 30 MG tablet, Take 1 tablet by mouth 2 (two) times daily., Disp: 60  tablet, Rfl: 0 .  [START ON 04/21/2019] amphetamine-dextroamphetamine (ADDERALL) 30 MG tablet, Take 1 tablet by mouth 2 (two) times daily., Disp: 60 tablet, Rfl: 0 .  atenolol (TENORMIN) 100 MG tablet, Take 1 tablet (100 mg total) by mouth daily after breakfast., Disp: 30 tablet, Rfl: 2 .  clonazePAM (KLONOPIN) 1 MG tablet, TAKE 1 TABLET (1 MG TOTAL) BY MOUTH 3 (THREE) TIMES DAILY AS NEEDED FOR ANXIETY., Disp: 90 tablet, Rfl: 2 .  DULoxetine (CYMBALTA) 60 MG capsule, TAKE 1 CAPSULE BY MOUTH TWICE A DAY, Disp: 180 capsule, Rfl: 0 .  fluticasone (FLONASE) 50 MCG/ACT nasal spray, Place 1 spray into both nostrils as needed for allergies or rhinitis. (Patient not taking: Reported on 10/21/2018), Disp: , Rfl: 2 .  gabapentin (NEURONTIN) 300 MG capsule, TAKE 1 CAPSULE BY MOUTH THREE TIMES A DAY, Disp: 270 capsule, Rfl: 0 .  lithium carbonate (LITHOBID) 300 MG CR tablet, TAKE 2 TABLETS (600 MG TOTAL) BY MOUTH AT BEDTIME., Disp: 180 tablet, Rfl: 1 .  nicotine polacrilex (NICORETTE) 2 MG gum, Take 1 each (2 mg total) by mouth as needed for smoking cessation. (Patient not taking: Reported on 10/21/2018), Disp: 100 tablet, Rfl: 0 .  perphenazine (TRILAFON) 16 MG tablet, TAKE 1 TABLET (16 MG TOTAL) BY MOUTH 2 (TWO) TIMES DAILY., Disp: 180 tablet, Rfl: 1 .  traZODone (DESYREL) 100 MG tablet, Take 3 tablets (300 mg total) by mouth at bedtime as needed for sleep., Disp: 270 tablet, Rfl: 0   Medication Side Effects: weight gain  Family Medical/ Social History: Changes? No  MENTAL HEALTH EXAM:  Height 6\' 2"  (1.88 m), weight 280 lb (127 kg).Body mass index is 35.95 kg/m. Muscle strengths and  tone 5/5, postural reflexes and gait 0/0, and AIMS = 0 others deferred for coronavirus shutdown  General Appearance: Casual, Disheveled, Fairly Groomed and Obese  Eye Contact:  Fair  Speech:  Clear and Coherent, Normal Rate and Talkative  Volume:  Normal  Mood:  Anxious, Dysphoric, Euthymic and Worthless  Affect:  Blunt,  Inappropriate and Anxious  Thought Process:  Irrelevant, Linear and Descriptions of Associations: Loose  Orientation:  Full (Time, Place, and Person)  Thought Content: Ilusions, Obsessions, Paranoid Ideation and Rumination   Suicidal Thoughts:  No  Homicidal Thoughts:  No  Memory:  Immediate;   Fair Remote;   Fair  Judgement:  Fair  Insight:  Fair and Lacking  Psychomotor Activity:  Normal, Decreased and Mannerisms  Concentration:  Concentration: Fair and Attention Span: Poor  Recall:  [employer] of Knowledge: Good  Language: Fair  Assets:  Housing Leisure Time Resilience  ADL's:  Intact  Cognition: WNL  Prognosis:  Poor    DIAGNOSES:    ICD-10-CM   1. Schizoaffective disorder, bipolar type without good prognostic features ([place])  F25.0   2. Attention deficit hyperactivity disorder (ADHD), combined type, moderate  F90.2 amphetamine-dextroamphetamine (ADDERALL) 30 MG tablet    amphetamine-dextroamphetamine (ADDERALL) 30 MG tablet  3. Generalized anxiety disorder  F41.1   4. OSA (obstructive sleep apnea)  G47.33     Receiving Psychotherapy: Yes [facility] [employer] job coaching   RECOMMENDATIONS: He is E scribed Adderall 30 mg IR twice daily #60 each for November 1 and December 1 just having filled the 02/20/2019 eScription sent end of last week ending up before [facility] [address] [facility].  He has adequate supply needing no review other than simple acknowledgment today of trazodone 100 mg taking 3 tablets total 300 mg every bedtime for insomnia, lithium carbonate 300 mg ER 2 every bedtime for schizoaffective bipolar, perphenazine 16 mg twice daily for schizoaffective bipolar, and duloxetine 60 mg twice daily for his schizoaffective bipolar, generalized anxiety, and ADHD.  He has gabapentin 300 mg 3 times daily for anxiety and chronic pain current supply, clonazepam 1 mg twice daily as needed for anxiety, and atenolol 100 mg daily after breakfast for  anxiety and metabolic syndrome symptoms.  He returns in 2 months for follow-up continuing job search and to see PCP while restoring diet upon his completion of move.   Delight Hoh, MD

## 2019-03-14 ENCOUNTER — Other Ambulatory Visit: Payer: Self-pay | Admitting: Psychiatry

## 2019-03-14 DIAGNOSIS — F250 Schizoaffective disorder, bipolar type: Secondary | ICD-10-CM

## 2019-03-14 DIAGNOSIS — F411 Generalized anxiety disorder: Secondary | ICD-10-CM

## 2019-04-01 ENCOUNTER — Ambulatory Visit (INDEPENDENT_AMBULATORY_CARE_PROVIDER_SITE_OTHER): Payer: Self-pay | Admitting: Psychiatry

## 2019-04-01 ENCOUNTER — Encounter: Payer: Self-pay | Admitting: Psychiatry

## 2019-04-01 ENCOUNTER — Other Ambulatory Visit: Payer: Self-pay

## 2019-04-01 VITALS — Ht 74.0 in | Wt 278.0 lb

## 2019-04-01 DIAGNOSIS — G4733 Obstructive sleep apnea (adult) (pediatric): Secondary | ICD-10-CM

## 2019-04-01 DIAGNOSIS — F411 Generalized anxiety disorder: Secondary | ICD-10-CM

## 2019-04-01 DIAGNOSIS — F902 Attention-deficit hyperactivity disorder, combined type: Secondary | ICD-10-CM

## 2019-04-01 DIAGNOSIS — F250 Schizoaffective disorder, bipolar type: Secondary | ICD-10-CM

## 2019-04-01 MED ORDER — CLONAZEPAM 2 MG PO TABS: 2.0000 mg | ORAL_TABLET | Freq: Two times a day (BID) | ORAL | 0 refills | Status: DC

## 2019-04-01 MED ORDER — GABAPENTIN 600 MG PO TABS: 600.0000 mg | ORAL_TABLET | Freq: Three times a day (TID) | ORAL | 0 refills | Status: DC

## 2019-04-01 NOTE — Progress Notes (Signed)
[facility]  Patient ID: Matthew [name],  MRN: [id number]  PCP: Aura Dials, MD  Date of Evaluation: 04/01/2019 Time spent:10 minutes from 1330 to 1340  Chief Complaint:  Chief Complaint    Schizophrenia; Paranoid; Anxiety; ADHD      HISTORY/CURRENT STATUS: Matthew [name] is seen onsite in office 10 minutes face-to-face individually with consent with epic collateral for psychiatric interview and exam in 5-week evaluation and management of schizoaffective bipolar depressed, generalized anxiety, and attention deficit disorder.  Despite moving into [facility] for his own supervised living away from parents, patient has little sense of improved autonomy or capability to function.  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  Though [facility] and [employer] have been helping him with his job skills, applications, and potential desirability for hire, patient is getting more anxious and hopeless with his lack of success.  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  He presents today with anxiety requesting more Klonopin and gabapentin dosing for the short-term in order to be able to bear with his life change and his lack of income whether from coronavirus perspective or his chronic mental illness relative to family perspective.  He presents for brief appointment seeking these 2 medications having had recent appointments for all of his other medications already intact and updated.  He has declined to transfer his care to [facility] yet but continues to work with [employer] individual therapist who will hopefully help him adapt to the resources available to him for his chronic mental illness when he processes whether he will be able to get a CT tech job again.  He  has no mania, suicidality, delirium or substance disorder other than his cigarettes.   Individual Medical History/ Review of Systems: Changes? :No Reporting chronic celiac intolerance of gluten he is eating more gluten-containing fast food living on his own.  Allergies: Gluten meal and Trazodone and nefazodone  Current Medications:  Current Outpatient Medications:  .  amphetamine-dextroamphetamine (ADDERALL) 30 MG tablet, Take 1 tablet by mouth 2 (two) times daily., Disp: 60 tablet, Rfl: 0 .  [START ON 04/21/2019] amphetamine-dextroamphetamine (ADDERALL) 30 MG tablet, Take 1 tablet by mouth 2 (two) times daily., Disp: 60 tablet, Rfl: 0 .  atenolol (TENORMIN) 100 MG tablet, Take 1 tablet (100 mg total) by mouth daily after breakfast., Disp: 30 tablet, Rfl: 2 .  clonazePAM (KLONOPIN) 2 MG tablet, Take 1 tablet (2 mg total) by mouth 2 (two) times daily., Disp: 180 tablet, Rfl: 0 .  DULoxetine (CYMBALTA) 60 MG capsule, TAKE 1 CAPSULE BY MOUTH TWICE A DAY, Disp: 180 capsule, Rfl: 0 .  fluticasone (FLONASE) 50 MCG/ACT nasal spray, Place 1 spray into both nostrils as needed for allergies or rhinitis. (Patient not taking: Reported on 10/21/2018), Disp: , Rfl: 2 .  gabapentin (NEURONTIN) 600 MG tablet, Take 1 tablet (600 mg total) by mouth 3 (three) times daily., Disp: 270 tablet, Rfl: 0 .  lithium carbonate (LITHOBID) 300 MG CR tablet, TAKE 2 TABLETS (600 MG TOTAL) BY MOUTH AT BEDTIME., Disp: 180 tablet, Rfl: 1 .  nicotine polacrilex (NICORETTE) 2 MG gum, Take 1 each (2 mg total) by mouth as needed for smoking cessation. (Patient not taking: Reported on 10/21/2018), Disp: 100 tablet, Rfl: 0 .  perphenazine (TRILAFON)  16 MG tablet, TAKE 1 TABLET (16 MG TOTAL) BY MOUTH 2 (TWO) TIMES DAILY., Disp: 180 tablet, Rfl: 1 .  traZODone (DESYREL) 100 MG tablet, TAKE 3 TABLETS BY MOUTH EVERY DAY AT BEDTIME AS NEEDED FOR SLEEP, Disp: 270 tablet, Rfl: 0   Medication Side Effects: none except weight gain  Family Medical/  Social History: Changes? No  MENTAL HEALTH EXAM:  Height 6\' 2"  (1.88 m), weight 278 lb (126.1 kg).Body mass index is 35.69 kg/m. Muscle strengths and tone 5/5, postural reflexes and gait 0/0, and AIMS = 0 others deferred for coronavirus shutdown  General Appearance: Bizarre, disheveled, guarded, and obese  Eye Contact:  Fair  Speech:  Clear and Coherent, Slow and Talkative  Volume:  Normal  Mood:  Anxious, Depressed, Dysphoric, Hopeless, Irritable and Worthless  Affect:  Non-Congruent, Depressed, Inappropriate, Labile, Restricted and Anxious  Thought Process:  Disorganized, Irrelevant, Linear and Descriptions of Associations: Loose  Orientation:  Full (Time, Place, and Person)  Thought Content: Illogical, Ilusions, Paranoid Ideation and Rumination   Suicidal Thoughts:  No  Homicidal Thoughts:  No  Memory:  Immediate;   Fair Remote;   Fair  Judgement:  Impaired  Insight:  Lacking to fair  Psychomotor Activity:  Normal, Increased, Decreased, Mannerisms and Restlessness  Concentration:  Concentration: Fair and Attention Span: Poor  Recall:  of Knowledge: Fair  Language: Fair  Assets:  Leisure Time Resilience Talents/Skills  ADL's:  Intact  Cognition: WNL  Prognosis:  Poor    DIAGNOSES:    ICD-10-CM   1. Schizoaffective disorder, bipolar type without good prognostic features (HCC)  F25.0 clonazePAM (KLONOPIN) 2 MG tablet  2. Generalized anxiety disorder  F41.1 clonazePAM (KLONOPIN) 2 MG tablet    gabapentin (NEURONTIN) 600 MG tablet  3. Attention deficit hyperactivity disorder (ADHD), combined type, moderate  F90.2   4. OSA (obstructive sleep apnea)  G47.33     Receiving Psychotherapy: Yes Individual therapist at [facility]   RECOMMENDATIONS: Klonopin is increased to 2 mg twice daily #180 with no refill [facility] [address] patient needing 47-month supply for logistics and keeping time-limited on expectations of use and outcome for recent increase for  generalized anxiety and schizoaffective bipolar.  Similarly, Neurontin is increased to 600 mg 3 times daily sent as #270 with no [facility]ill [facility] [address] for generalized anxiety.  He has adequate supply of other medications presenting today simply to increase these medications for anxiety.   2-month, MD

## 2019-04-20 ENCOUNTER — Ambulatory Visit: Payer: Medicaid Other | Admitting: Psychiatry

## 2019-05-20 ENCOUNTER — Encounter (HOSPITAL_COMMUNITY): Payer: Self-pay

## 2019-05-20 ENCOUNTER — Emergency Department (HOSPITAL_COMMUNITY): Payer: Medicaid Other

## 2019-05-20 ENCOUNTER — Emergency Department (HOSPITAL_COMMUNITY)
Admission: EM | Admit: 2019-05-20 | Discharge: 2019-05-20 | Discharge status: Against Medical Advice | Payer: Medicaid Other | Attending: Emergency Medicine | Admitting: Emergency Medicine

## 2019-05-20 ENCOUNTER — Other Ambulatory Visit: Payer: Self-pay

## 2019-05-20 ENCOUNTER — Encounter (HOSPITAL_COMMUNITY): Payer: Self-pay | Admitting: Emergency Medicine

## 2019-05-20 DIAGNOSIS — Z5321 Procedure and treatment not carried out due to patient leaving prior to being seen by health care provider: Secondary | ICD-10-CM | POA: Diagnosis not present

## 2019-05-20 DIAGNOSIS — R0602 Shortness of breath: Secondary | ICD-10-CM | POA: Diagnosis present

## 2019-05-20 IMAGING — CR DG CHEST 2V
2 series · 2 of 2 positions shown · non-contrast
Comparison: CT chest dated [DATE].

CLINICAL DATA: Cough and shortness of breath for the past 3 days.

EXAM:
CHEST - 2 VIEW

[chest pa]
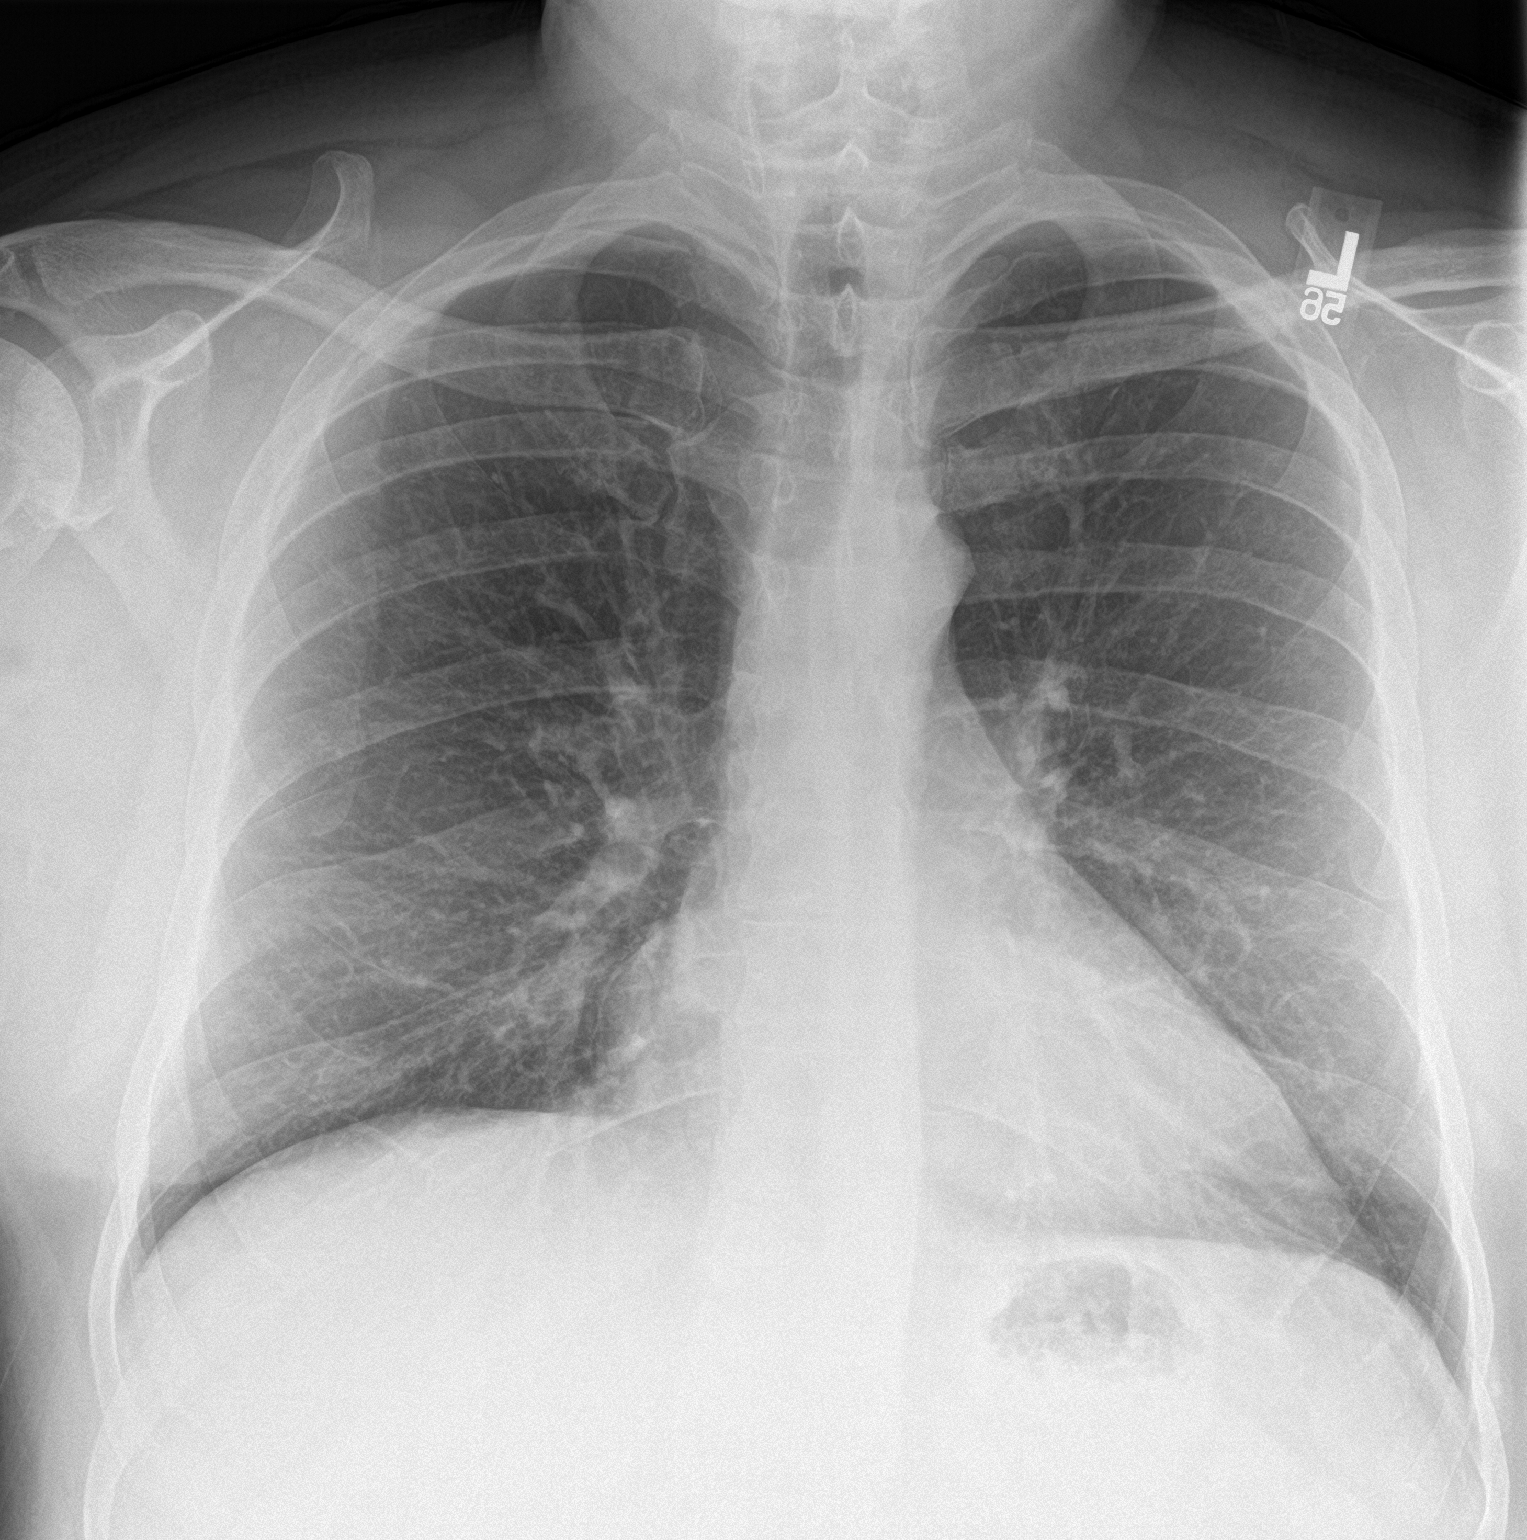

[chest lat]
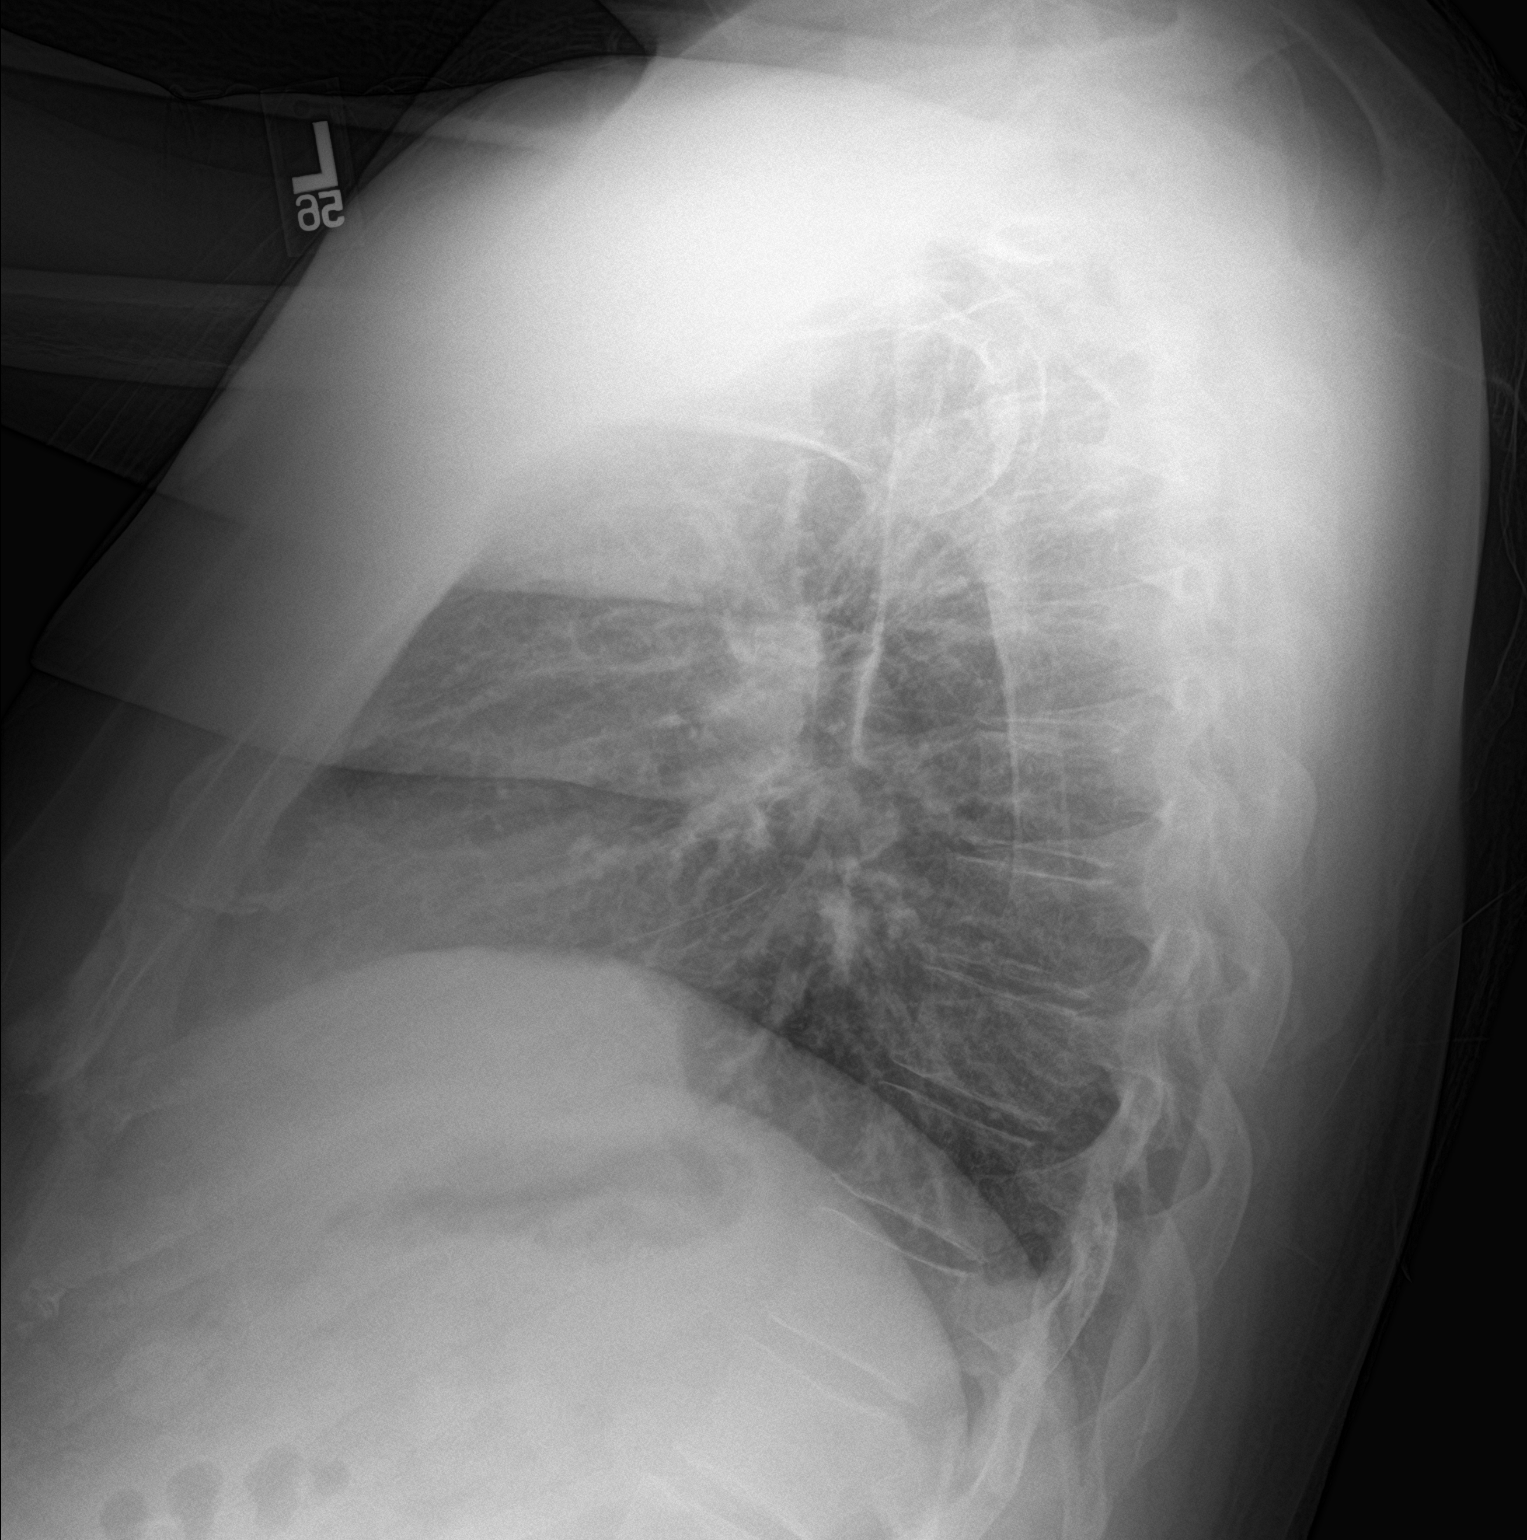

[2 of 2 positions shown; findings below may reference images not displayed]

FINDINGS: The heart size and mediastinal contours are within normal limits.
Both lungs are clear. The visualized skeletal structures are
unremarkable.
IMPRESSION: No active cardiopulmonary disease.

## 2019-05-20 NOTE — ED Notes (Signed)
Called x3 no reply. 

## 2019-05-20 NOTE — ED Notes (Signed)
Registration called for pt x3. No answer.

## 2019-05-20 NOTE — ED Notes (Signed)
Pt did not respond to vital signs check x1.

## 2019-05-20 NOTE — ED Triage Notes (Signed)
Patient reports Covid exposure. C/o cough and "feeling bad" x2 weeks.

## 2019-05-20 NOTE — ED Notes (Signed)
Called pt x2 for vitals, no response. °

## 2019-05-20 NOTE — ED Triage Notes (Signed)
Pt reports SOB, cough, CP x 3 days. Denies known covid contacts, has not checked temp at home.

## 2019-05-21 ENCOUNTER — Emergency Department (HOSPITAL_COMMUNITY)
Admission: EM | Admit: 2019-05-21 | Discharge: 2019-05-21 | Discharge status: Against Medical Advice | Payer: Medicaid Other | Source: Home / Self Care

## 2019-05-21 NOTE — ED Notes (Signed)
No answer for VS 

## 2019-05-22 ENCOUNTER — Other Ambulatory Visit: Payer: Self-pay | Admitting: Psychiatry

## 2019-05-22 DIAGNOSIS — F411 Generalized anxiety disorder: Secondary | ICD-10-CM

## 2019-05-22 DIAGNOSIS — F902 Attention-deficit hyperactivity disorder, combined type: Secondary | ICD-10-CM

## 2019-05-25 ENCOUNTER — Telehealth: Payer: Self-pay | Admitting: Psychiatry

## 2019-05-25 DIAGNOSIS — F411 Generalized anxiety disorder: Secondary | ICD-10-CM

## 2019-05-25 DIAGNOSIS — F902 Attention-deficit hyperactivity disorder, combined type: Secondary | ICD-10-CM

## 2019-05-25 DIAGNOSIS — F250 Schizoaffective disorder, bipolar type: Secondary | ICD-10-CM

## 2019-05-25 MED ORDER — AMPHETAMINE-DEXTROAMPHETAMINE 30 MG PO TABS: 30.0000 mg | ORAL_TABLET | Freq: Two times a day (BID) | ORAL | 0 refills | Status: DC

## 2019-05-25 MED ORDER — CLONAZEPAM 1 MG PO TABS: 1.0000 mg | ORAL_TABLET | Freq: Three times a day (TID) | ORAL | 0 refills | Status: DC | PRN

## 2019-05-25 NOTE — Telephone Encounter (Signed)
Patient called and said that he needs a refill on his adderall 30 mg to [facility] [address] Also he said that he is very sedated on the klonopin 2 mg and would like to go back to th 1 mg of the klonopin 3 x day to [facility]

## 2019-05-25 NOTE — Telephone Encounter (Signed)
Patient phones office that Klonopin 2 mg causes sedation not just relieving anxiety and is exhausted Adderall 30 mg, though the pharmacy dispensed Adderall 15 mg IR tablet from 11/27/2018 E scription on 05/21/2018.  Therefore though the 60 mg IR tablet is due the first of this month, the interim 15 mg fill may late the pharmacy dispensing of of the 30 mg IR [facility] [address] for ADHD and GAD medically necessary no contraindication.

## 2019-05-29 ENCOUNTER — Other Ambulatory Visit: Payer: Self-pay | Admitting: Psychiatry

## 2019-05-29 NOTE — Telephone Encounter (Signed)
Office works with patient by phone and I called the pharmacy [facility] [address] as patient is completely out of Klonopin having escription at the pharmacy from 05/25/2019 they are deferring thinking he has extra from the dispensing 05/06/2019 of 2 mg he has found sedating and has overuse in his illogical schizoaffective applications to generalized anxiety being newly at the [employer] at [place] when being moved out of parents home jobless as [employer] and [employer] attempt to get him work ready.  In reviewing with pharmacist Rosanne Ashing, he agrees to go ahead and fill the 05/25/2019 eScription for the Klonopin 1 mg 3 times daily #90 rather than sending an extra prescription for a week assuming responsibility here for for the medication applications.

## 2019-05-29 NOTE — Telephone Encounter (Signed)
Pt called requesting CLONAZEPAM 1 mg Rx to get filled early. Stated he is out. Was hard decreasing to lower dose. Ran out too soon. [facility]

## 2019-06-16 ENCOUNTER — Other Ambulatory Visit: Payer: Self-pay

## 2019-06-16 DIAGNOSIS — F250 Schizoaffective disorder, bipolar type: Secondary | ICD-10-CM

## 2019-06-16 DIAGNOSIS — F411 Generalized anxiety disorder: Secondary | ICD-10-CM

## 2019-06-16 MED ORDER — TRAZODONE HCL 100 MG PO TABS: ORAL_TABLET | ORAL | 0 refills | Status: DC

## 2019-06-24 ENCOUNTER — Telehealth: Payer: Self-pay | Admitting: Psychiatry

## 2019-06-24 ENCOUNTER — Other Ambulatory Visit: Payer: Self-pay

## 2019-06-24 DIAGNOSIS — F902 Attention-deficit hyperactivity disorder, combined type: Secondary | ICD-10-CM

## 2019-06-24 MED ORDER — AMPHETAMINE-DEXTROAMPHETAMINE 30 MG PO TABS: 30.0000 mg | ORAL_TABLET | Freq: Two times a day (BID) | ORAL | 0 refills | Status: DC

## 2019-06-24 NOTE — Telephone Encounter (Signed)
Last refill 05/27/2019 #60, pended for Dr. Marlyne Beards to approve  Has apt 07/02/2019

## 2019-06-24 NOTE — Telephone Encounter (Signed)
Patient seems back on schedule with his medications sending Adderall 30 mg IR twice daily #60 with no refill to [facility] [address] medically necessary no contraindication

## 2019-06-24 NOTE — Telephone Encounter (Signed)
Pt requesting an Adderall refill. [facility] [address]. Next appt scheduled for 2/11.

## 2019-06-26 ENCOUNTER — Other Ambulatory Visit: Payer: Self-pay | Admitting: Psychiatry

## 2019-06-26 DIAGNOSIS — F411 Generalized anxiety disorder: Secondary | ICD-10-CM

## 2019-07-02 ENCOUNTER — Encounter: Payer: Self-pay | Admitting: Psychiatry

## 2019-07-02 ENCOUNTER — Ambulatory Visit (INDEPENDENT_AMBULATORY_CARE_PROVIDER_SITE_OTHER): Payer: Self-pay | Admitting: Psychiatry

## 2019-07-02 ENCOUNTER — Other Ambulatory Visit: Payer: Self-pay

## 2019-07-02 VITALS — Ht 75.0 in | Wt 260.0 lb

## 2019-07-02 DIAGNOSIS — G4733 Obstructive sleep apnea (adult) (pediatric): Secondary | ICD-10-CM

## 2019-07-02 DIAGNOSIS — F902 Attention-deficit hyperactivity disorder, combined type: Secondary | ICD-10-CM

## 2019-07-02 DIAGNOSIS — F411 Generalized anxiety disorder: Secondary | ICD-10-CM

## 2019-07-02 DIAGNOSIS — F250 Schizoaffective disorder, bipolar type: Secondary | ICD-10-CM

## 2019-07-02 MED ORDER — AMPHETAMINE-DEXTROAMPHETAMINE 30 MG PO TABS: 30.0000 mg | ORAL_TABLET | Freq: Two times a day (BID) | ORAL | 0 refills | Status: DC

## 2019-07-02 MED ORDER — GABAPENTIN 600 MG PO TABS: 600.0000 mg | ORAL_TABLET | Freq: Three times a day (TID) | ORAL | 1 refills | Status: DC

## 2019-07-02 MED ORDER — PERPHENAZINE 16 MG PO TABS: ORAL_TABLET | ORAL | 1 refills | Status: DC

## 2019-07-02 MED ORDER — LITHIUM CARBONATE ER 300 MG PO TBCR: 600.0000 mg | EXTENDED_RELEASE_TABLET | Freq: Every day | ORAL | 1 refills | Status: DC

## 2019-07-02 MED ORDER — DULOXETINE HCL 60 MG PO CPEP: 60.0000 mg | ORAL_CAPSULE | Freq: Two times a day (BID) | ORAL | 1 refills | Status: DC

## 2019-07-02 MED ORDER — TRAZODONE HCL 100 MG PO TABS: ORAL_TABLET | ORAL | 1 refills | Status: DC

## 2019-07-02 MED ORDER — CLONAZEPAM 1 MG PO TABS: 1.0000 mg | ORAL_TABLET | Freq: Two times a day (BID) | ORAL | 1 refills | Status: DC | PRN

## 2019-07-02 MED ORDER — ATENOLOL 100 MG PO TABS: 100.0000 mg | ORAL_TABLET | Freq: Every day | ORAL | 1 refills | Status: DC

## 2019-07-02 NOTE — Patient Instructions (Signed)
Change Trilaon (perphenazine) 16 mg tablet to 1 tablet every morning and 2 tablets every bedtime

## 2019-07-02 NOTE — Progress Notes (Signed)
[facility]  Patient ID: GLEB MCGUIRE,  MRN: [id number]  PCP: Patient, No Pcp Per  Date of Evaluation: 07/02/2019 Time spent:10 minutes from 1535 to [place] Complaint:  Chief Complaint    Hallucinations; Paranoid; Depression; ADHD      HISTORY/CURRENT STATUS: Jacub is seen onsite in office 10 minutes face-to-face individually with consent with epic collateral for psychiatric interview and exam in 37-month medication management patient having been under care here for 3 years when he experienced job loss, parents requiring him to move out now at [employer], and attempting to adapt to community support and rehab for his schizoaffective disorder.  He remains working with the [employer] having to quit [place] is Medicaid considered that double dipping in rehab support especially toward obtaining a job.  However he had one job offer in the interim in [place] and could not move there as he considers it a dangerous city for the mentally ill.  Father has had a mini stroke at age 38 years, and brother having given the patient work in the past no longer provides, but patient is having an appointment in court with disability proceedings.  He simply seeks medication renewal today with only adjustment needing to be to titrate up the perphenazine 1 tablet as he is seeing dark person like shadows and hearing music he anticipates is an imminent relapse in psychotic symptoms.  Symptoms exacerbated when police investigated the patient for neighbor's report that the patient was following the neighbor who has psychotic disorder also with police laughing about it though patient conflicted and confused but still relieved to be released from any investigation. He otherwise considers himself doing better relative to medications.  He has no mania today, suicidality, or delirium but was in the ED end of December with respiratory distress receiving CT scan of the chest and respiratory  treatments apparently better though calling the next week to reduce his Klonopin as he was drowsy and not up and might contribute to reduce respiratory drive.  Klonopin was reduced from 2 mg twice daily to 1 mg 3 times daily and though considers this dose necessary, the Klonopin will be reduced 1 more mg if he is to increase the perphenazine.   Individual Medical History/ Review of Systems: Changes? :Yes  in ED end of December with respiratory distress receiving CT scan of the chest and respiratory treatments apparently better though calling the next week to reduce his Klonopin as he was drowsy and not up and might contribute to reduce respiratory drive.   Allergies: Gluten meal and Trazodone and nefazodone  Current Medications:  Current Outpatient Medications:  .  [START ON 07/24/2019] amphetamine-dextroamphetamine (ADDERALL) 30 MG tablet, Take 1 tablet by mouth 2 (two) times daily., Disp: 60 tablet, Rfl: 0 .  [START ON 08/24/2019] amphetamine-dextroamphetamine (ADDERALL) 30 MG tablet, Take 1 tablet by mouth 2 (two) times daily., Disp: 60 tablet, Rfl: 0 .  [START ON 09/23/2019] amphetamine-dextroamphetamine (ADDERALL) 30 MG tablet, Take 1 tablet by mouth 2 (two) times daily., Disp: 60 tablet, Rfl: 0 .  atenolol (TENORMIN) 100 MG tablet, Take 1 tablet (100 mg total) by mouth daily after breakfast., Disp: 90 tablet, Rfl: 1 .  clonazePAM (KLONOPIN) 1 MG tablet, Take 1 tablet (1 mg total) by mouth 2 (two) times daily as needed for anxiety., Disp: 180 tablet, Rfl: 1 .  DULoxetine (CYMBALTA) 60 MG capsule, Take 1 capsule (60 mg total) by mouth 2 (two) times daily., Disp: 180 capsule, Rfl: 1 .  fluticasone (FLONASE) 50 MCG/ACT nasal spray, Place 1 spray into both nostrils as needed for allergies or rhinitis. (Patient not taking: Reported on 10/21/2018), Disp: , Rfl: 2 .  gabapentin (NEURONTIN) 600 MG tablet, Take 1 tablet (600 mg total) by mouth 3 (three) times daily., Disp: 270 tablet, Rfl: 1 .  lithium  carbonate (LITHOBID) 300 MG CR tablet, Take 2 tablets (600 mg total) by mouth at bedtime., Disp: 180 tablet, Rfl: 1 .  nicotine polacrilex (NICORETTE) 2 MG gum, Take 1 each (2 mg total) by mouth as needed for smoking cessation. (Patient not taking: Reported on 10/21/2018), Disp: 100 tablet, Rfl: 0 .  perphenazine (TRILAFON) 16 MG tablet, Take 1 pill total 16 mg every morning and take 2 pills by mouth total 32 mg every bedtime, Disp: 270 tablet, Rfl: 1 .  traZODone (DESYREL) 100 MG tablet, TAKE 3 TABLETS BY MOUTH EVERY DAY AT BEDTIME AS NEEDED FOR SLEEP, Disp: 270 tablet, Rfl: 1  Medication Side Effects: weight gain and drowsiness  Family Medical/ Social History: Changes? No  MENTAL HEALTH EXAM:  Height 6\' 3"  (1.905 m), weight 260 lb (117.9 kg).Body mass index is 32.5 kg/m. Muscle strengths and tone 5/5, postural reflexes and gait 0/0, and AIMS = 0 otherwise deferred for coronavirus shot  General Appearance: Casual, Fairly Groomed and Obese  Eye Contact:  Fair to limited  Speech:  Clear and Coherent, Normal Rate and Talkative monotone  Volume:  Normal  Mood:  Anxious, Dysphoric and Euthymic  Affect:  Congruent, Inappropriate, Labile and Anxious  Thought Process:  Coherent, Irrelevant, Linear and Descriptions of Associations: Loose  Orientation:  Full (Time, Place, and Person)  Thought Content: Delusions, Ilusions, Paranoid Ideation and Rumination   Suicidal Thoughts:  No  Homicidal Thoughts:  No  Memory:  Immediate;   Good and Fair Remote;   Good and Fair  Judgement:  Fair to limited  Insight:  Lacking  Psychomotor Activity:  Normal, Decreased and Mannerisms  Concentration:  Concentration: Fair and Attention Span: Fair  Recall:  of Knowledge: Good  Language: Fair  Assets:  Resilience Social Support Talents/Skills  ADL's:  Intact  Cognition: WNL  Prognosis:  Poor    DIAGNOSES:    ICD-10-CM   1. Schizoaffective disorder, bipolar type without good prognostic features  (HCC)  F25.0 lithium carbonate (LITHOBID) 300 MG CR tablet    perphenazine (TRILAFON) 16 MG tablet    traZODone (DESYREL) 100 MG tablet    clonazePAM (KLONOPIN) 1 MG tablet  2. Generalized anxiety disorder  F41.1 atenolol (TENORMIN) 100 MG tablet    DULoxetine (CYMBALTA) 60 MG capsule    gabapentin (NEURONTIN) 600 MG tablet    traZODone (DESYREL) 100 MG tablet    clonazePAM (KLONOPIN) 1 MG tablet  3. Attention deficit hyperactivity disorder (ADHD), combined type, moderate  F90.2 amphetamine-dextroamphetamine (ADDERALL) 30 MG tablet    amphetamine-dextroamphetamine (ADDERALL) 30 MG tablet    DULoxetine (CYMBALTA) 60 MG capsule    amphetamine-dextroamphetamine (ADDERALL) 30 MG tablet  4. OSA (obstructive sleep apnea)  G47.33     Receiving Psychotherapy: Yes [facility]   RECOMMENDATIONS: [facility] [address] for 61-month supply of increased Trilafon 16 mg to 1 every morning and 2 every evening #270 with 1 refill for schizoaffective bipolar, Klonopin decreased to 1 mg twice daily as needed sent as #180 with 1 refill for generalized anxiety, and Adderall 30 mg IR twice daily as #30 each for March 5, April 4, and May  4 for ADHD as [place] registry documents last Adderall fill 06/24/2019 and Klonopin 06/30/2019.  His other chronic medications are renewed for 6 months as lithium 300 mg CR taking 2 every bedtime #180 with 1 refill for schizoaffective bipolar, gabapentin 600 mg 3 times daily for generalized anxiety chronic pain #270 with 1 refill, Cymbalta 60 mg twice daily #180 with 1 refill for ADHD and generalized anxiety, Tenormin 100 mg every morning after breakfast sent as #90 with 1 refill for generalized anxiety, trazodone 100 mg tablet taking 3 tablets total 300 mg every bedtime for insomnia sent as #270 with 1 refill for generalized anxiety and schizoaffective bipolar.  He returns in 3 months for medication management follow-up.   Chauncey Mann, MD

## 2019-08-05 ENCOUNTER — Telehealth: Payer: Self-pay | Admitting: Psychiatry

## 2019-08-05 NOTE — Telephone Encounter (Signed)
Pt called. Would like to know what Dr. Marlyne Beards thought about pt starting med by injection instead of pill form. Patient doing a lot of research and talking to [facility]. Please advise thoughts [phone]

## 2019-08-05 NOTE — Telephone Encounter (Signed)
Patient followings about [facility] suggestion for the injection clinic at [facility] for [place] to be changed over to Depo injection form.  I educated him on know of his past and current medications the likely best consider Abilify Maintenna using 1/4-1/2 the current trial of fine dose until the injection is becoming effective though reviewing and answering his questions about all the other forms possible.  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  He understands the availability to turn for care here if unsuccessful or unhappy but I encouraged him to do the best with the switch over to that best quality care being planned for him there and elsewhere.

## 2019-09-15 ENCOUNTER — Telehealth: Payer: Self-pay | Admitting: Psychiatry

## 2019-09-15 DIAGNOSIS — F411 Generalized anxiety disorder: Secondary | ICD-10-CM

## 2019-09-15 DIAGNOSIS — F250 Schizoaffective disorder, bipolar type: Secondary | ICD-10-CM

## 2019-09-15 MED ORDER — CLONAZEPAM 1 MG PO TABS: 1.0000 mg | ORAL_TABLET | Freq: Three times a day (TID) | ORAL | 0 refills | Status: DC | PRN

## 2019-09-15 NOTE — Telephone Encounter (Signed)
Patient 3 months ago was sedated with Klonopin 2 mg now over the last 2 months back to 1 mg twice daily.  He now phones that he has tests taking over the next couple weeks with great anxiety needing higher dose of the Klonopin sent as 1 mg 3 times daily #90 with [facility] [address] as they have been doing his 81-month supply of Klonopin only as the tablets at a time now to go up to 90 tablets for the next month

## 2019-09-15 NOTE — Telephone Encounter (Signed)
Pt asks for increase dose of Klonopin. Having anxiety over tests for work that will take place over the next few weeks.   [facility] [address] [facility]

## 2019-09-29 ENCOUNTER — Other Ambulatory Visit: Payer: Self-pay

## 2019-09-29 ENCOUNTER — Ambulatory Visit (INDEPENDENT_AMBULATORY_CARE_PROVIDER_SITE_OTHER): Payer: Medicaid Other | Admitting: Psychiatry

## 2019-09-29 ENCOUNTER — Encounter: Payer: Self-pay | Admitting: Psychiatry

## 2019-09-29 VITALS — Ht 75.0 in | Wt 257.0 lb

## 2019-09-29 DIAGNOSIS — F250 Schizoaffective disorder, bipolar type: Secondary | ICD-10-CM

## 2019-09-29 DIAGNOSIS — F411 Generalized anxiety disorder: Secondary | ICD-10-CM

## 2019-09-29 DIAGNOSIS — F902 Attention-deficit hyperactivity disorder, combined type: Secondary | ICD-10-CM

## 2019-09-29 MED ORDER — AMPHETAMINE-DEXTROAMPHETAMINE 30 MG PO TABS: 30.0000 mg | ORAL_TABLET | Freq: Two times a day (BID) | ORAL | 0 refills | Status: DC

## 2019-09-29 MED ORDER — CLONAZEPAM 1 MG PO TABS: 1.0000 mg | ORAL_TABLET | Freq: Three times a day (TID) | ORAL | 2 refills | Status: DC | PRN

## 2019-09-29 NOTE — Progress Notes (Signed)
[facility]  Patient ID: Matthew [name],  MRN: [id number]  PCP: Patient, No Pcp Per  Date of Evaluation: 09/29/2019 Time spent:15 minutes from 1525 to 1540  Chief Complaint:  Chief Complaint    Hallucinations; Depression; Manic Behavior; Anxiety; ADHD      HISTORY/CURRENT STATUS: Matthew [name] is seen onsite in office 15 minutes face-to-face individually with consent with epic collateral for psychiatric update eval relative to medication management which patient has thus far refused to transfer to [facility] exam his phone call March 17 about Depo antipsychotic suggested by minimizes [facility] house who with his assisted living at [facility] are now appreciated by patient.  He no longer has any peers at [facility] stalking him but acknowledges that he does his part there for daily life to be good.  Interim medical follow-up with Dr. Cleda Daub, at [facility] family practice started metformin, lisinopril, and a statin losing 3 pounds from 3 months ago being somewhat years overdue for follow-up medically for his general health interfacing with any metabolic side effects of his medications psychiatrically.  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  He reports 2-1/2 years since his last employment having only 1 job offer in [place] for which he did not attend the job interview.  He considers that his weight blew up in the past when he was treated with Zyprexa.  He alleges that he is on so many medications but states this is the first time he has felt somewhat good and hopeful several years.  He is still waiting on SSI response regarding disability but knows the courts are working on this.  He is not manic, floridly psychotic, suicidal, or delirious today.   Individual Medical History/ Review of Systems: Changes? :Yes  interim medical follow-up with Dr. Cleda Daub at [facility]  family practice started metformin, lisinopril, and a statin losing 3 pounds from 3 months ago being somewhat years overdue for follow-up medically for his general health.  I had attempted for some time for him to see primary care about his previous EKG with first-degree AV block  Allergies: Gluten meal and Trazodone and nefazodone  Current Medications:  Current Outpatient Medications:  .  [START ON 10/23/2019] amphetamine-dextroamphetamine (ADDERALL) 30 MG tablet, Take 1 tablet by mouth 2 (two) times daily., Disp: 60 tablet, Rfl: 0 .  [START ON 11/22/2019] amphetamine-dextroamphetamine (ADDERALL) 30 MG tablet, Take 1 tablet by mouth 2 (two) times daily., Disp: 60 tablet, Rfl: 0 .  [START ON 12/22/2019] amphetamine-dextroamphetamine (ADDERALL) 30 MG tablet, Take 1 tablet by mouth 2 (two) times daily., Disp: 60 tablet, Rfl: 0 .  atenolol (TENORMIN) 100 MG tablet, Take 1 tablet (100 mg total) by mouth daily after breakfast., Disp: 90 tablet, Rfl: 1 .  clonazePAM (KLONOPIN) 1 MG tablet, Take 1 tablet (1 mg total) by mouth 3 (three) times daily as needed for anxiety., Disp: 90 tablet, Rfl: 2 .  DULoxetine (CYMBALTA) 60 MG capsule, Take 1 capsule (60 mg total) by mouth 2 (two) times daily., Disp: 180 capsule, Rfl: 1 .  fluticasone (FLONASE) 50 MCG/ACT nasal spray, Place 1 spray into both nostrils as needed for allergies or rhinitis. (Patient not taking: Reported on 10/21/2018), Disp: , Rfl: 2 .  gabapentin (NEURONTIN) 600 MG tablet, Take 1 tablet (600 mg total) by mouth 3 (three) times daily., Disp: 270 tablet, Rfl: 1 .  lithium carbonate (LITHOBID) 300 MG  CR tablet, Take 2 tablets (600 mg total) by mouth at bedtime., Disp: 180 tablet, Rfl: 1 .  nicotine polacrilex (NICORETTE) 2 MG gum, Take 1 each (2 mg total) by mouth as needed for smoking cessation. (Patient not taking: Reported on 10/21/2018), Disp: 100 tablet, Rfl: 0 .  perphenazine (TRILAFON) 16 MG tablet, Take 1 pill total 16 mg every morning and take 2 pills  by mouth total 32 mg every bedtime, Disp: 270 tablet, Rfl: 1 .  traZODone (DESYREL) 100 MG tablet, TAKE 3 TABLETS BY MOUTH EVERY DAY AT BEDTIME AS NEEDED FOR SLEEP, Disp: 270 tablet, Rfl: 1 Medication Side Effects: weight gain  Family Medical/ Social History: Changes? Yes mother has atrial fibrillation and father is had two TIAs.  MENTAL HEALTH EXAM:  Height 6\' 3"  (1.905 m), weight 257 lb (116.6 kg).Body mass index is 32.12 kg/m. Muscle strengths and tone 5/5, postural reflexes and gait 0/0, and AIMS = 0.  General Appearance: Casual, Fairly Groomed, Guarded and Obese  Eye Contact:  Fair  Speech:  Clear and Coherent, Normal Rate and Talkative  Volume:  Normal  Mood:  Anxious, Depressed, Dysphoric, Hopeless and Worthless  Affect:  Blunt, Depressed, Inappropriate and Anxious  Thought Process:  Coherent, Goal Directed, Irrelevant, Linear and Descriptions of Associations: Loose  Orientation:  Full (Time, Place, and Person)  Thought Content: Illogical, Ilusions, Paranoid Ideation and Rumination   Suicidal Thoughts:  No  Homicidal Thoughts:  No  Memory:  Immediate;   Fair Remote;   Fair  Judgement:  Impaired to limited  Insight:  Lacking  Psychomotor Activity:  Decreased and Mannerisms  Concentration:  Concentration: Fair and Attention Span: Fair  Recall:  [employer] of Knowledge: Good  Language: Fair  Assets:  Communication Skills Housing Resilience  ADL's:  Intact  Cognition: WNL  Prognosis:  Poor    DIAGNOSES:    ICD-10-CM   1. Schizoaffective disorder, bipolar type without good prognostic features (HCC)  F25.0 clonazePAM (KLONOPIN) 1 MG tablet  2. Generalized anxiety disorder  F41.1 clonazePAM (KLONOPIN) 1 MG tablet  3. Attention deficit hyperactivity disorder (ADHD), combined type, moderate  F90.2 amphetamine-dextroamphetamine (ADDERALL) 30 MG tablet    amphetamine-dextroamphetamine (ADDERALL) 30 MG tablet    amphetamine-dextroamphetamine (ADDERALL) 30 MG tablet     Receiving Psychotherapy: Yes  [facility]  RECOMMENDATIONS: Psychosupportive psychoeducation clarifies medications as possible scribing and Ralph 30 mg IR twice daily #60 each for June 4, July 4, and August 3 for ADHD and [facility] [address].  Klonopin is E scribed 1 mg 3 times daily as needed for generalized anxiety and bipolar agitation #90 with 2 [facility] [address].  He has current supply of medications including Cymbalta 60 mg twice daily, Tenormin 100 mg every morning after breakfast, Trilafon 16 mg taking 1 every morning and 2 every bedtime, trazodone 100 mg taking 3 at bedtime as needed for insomnia, Neurontin 600 mg 3 times daily, and lithium 300 mg CR taking 2 tablets every bedtime all [facility] [address] for schizoaffective bipolar, generalized anxiety, and ADHD.  He returns in 3 months and less willing to transfer his care to [facility].   Delight Hoh, MD

## 2019-11-17 ENCOUNTER — Ambulatory Visit (INDEPENDENT_AMBULATORY_CARE_PROVIDER_SITE_OTHER): Payer: Self-pay | Admitting: Psychiatry

## 2019-11-17 ENCOUNTER — Encounter: Payer: Self-pay | Admitting: Psychiatry

## 2019-11-17 ENCOUNTER — Other Ambulatory Visit: Payer: Self-pay

## 2019-11-17 VITALS — Ht 75.0 in | Wt 260.0 lb

## 2019-11-17 DIAGNOSIS — G4733 Obstructive sleep apnea (adult) (pediatric): Secondary | ICD-10-CM

## 2019-11-17 DIAGNOSIS — F250 Schizoaffective disorder, bipolar type: Secondary | ICD-10-CM

## 2019-11-17 DIAGNOSIS — F411 Generalized anxiety disorder: Secondary | ICD-10-CM

## 2019-11-17 DIAGNOSIS — F902 Attention-deficit hyperactivity disorder, combined type: Secondary | ICD-10-CM

## 2019-11-17 MED ORDER — CLONAZEPAM 2 MG PO TABS: 2.0000 mg | ORAL_TABLET | Freq: Two times a day (BID) | ORAL | 1 refills | Status: DC | PRN

## 2019-11-17 MED ORDER — GABAPENTIN 600 MG PO TABS: 600.0000 mg | ORAL_TABLET | Freq: Three times a day (TID) | ORAL | 1 refills | Status: DC

## 2019-11-17 NOTE — Progress Notes (Signed)
[facility]  Patient ID: Matthew [name],  MRN: [id number]  PCP: Patient, No Pcp Per  Date of Evaluation: 11/17/2019 Time spent:10 minutes from 1150 to 1200  Chief Complaint:  Chief Complaint    Depression; Manic Behavior; Hallucinations; Anxiety; ADHD      HISTORY/CURRENT STATUS: [name] is seen Onsite in office 10 minutes face-to-face individually with consent with epic collateral as an urgent work in appointment today for psychiatric interview and exam in 6-week evaluation and management of schizoaffective bipolar, generalized anxiety, ADHD, and obstructive sleep apnea.  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  He now requests to resume the Klonopin 2 mg twice daily which caused drowsiness interfering with his studying for CT licensing exams again and he has never caught up on his CME.  The anxiety now limits his study severely such as relief of anxiety with increased Klonopin is no longer causing drowsiness in his overanxious state. 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  He states his living quarters are a mess and he has misplaced his gabapentin which helps not only his anxiety but also his chronic back and other pain.  He is helping elderly when they need and call him to do things.  He has had no mania, suicidality, psychosis or delirium flareup in the interim, though he has chronic symptoms and needs in these areas that may recur at  times   Individual Medical History/ Review of Systems: Changes? :Yes  weight is up another 3 pounds  Allergies: Gluten meal and Trazodone and nefazodone  Current Medications:  Current Outpatient Medications:  .  amphetamine-dextroamphetamine (ADDERALL) 30 MG tablet, Take 1 tablet by mouth 2 (two) times daily., Disp: 60 tablet, Rfl: 0 .  [START ON 11/22/2019] amphetamine-dextroamphetamine (ADDERALL) 30 MG tablet, Take 1 tablet by mouth 2 (two) times daily., Disp: 60 tablet, Rfl: 0 .  [START ON 12/22/2019] amphetamine-dextroamphetamine (ADDERALL) 30 MG tablet, Take 1 tablet by mouth 2 (two) times daily., Disp: 60 tablet, Rfl: 0 .  atenolol (TENORMIN) 100 MG tablet, Take 1 tablet (100 mg total) by mouth daily after breakfast., Disp: 90 tablet, Rfl: 1 .  clonazePAM (KLONOPIN) 2 MG tablet, Take 1 tablet (2 mg total) by mouth 2 (two) times daily as needed for anxiety., Disp: 60 tablet, Rfl: 1 .  DULoxetine (CYMBALTA) 60 MG capsule, Take 1 capsule (60 mg total) by mouth 2 (two) times daily., Disp: 180 capsule, Rfl: 1 .  fluticasone (FLONASE) 50 MCG/ACT nasal spray, Place 1 spray into both nostrils as needed for allergies or rhinitis. (Patient not taking: Reported on 10/21/2018), Disp: , Rfl: 2 .  gabapentin (NEURONTIN) 600 MG tablet, Take 1 tablet (600 mg total) by mouth 3 (three) times daily., Disp: 90 tablet, Rfl: 1 .  lithium carbonate (LITHOBID) 300 MG CR tablet, Take 2 tablets (600 mg total) by mouth at bedtime., Disp: 180 tablet, Rfl: 1 .  nicotine polacrilex (NICORETTE) 2 MG gum, Take  1 each (2 mg total) by mouth as needed for smoking cessation. (Patient not taking: Reported on 10/21/2018), Disp: 100 tablet, Rfl: 0 .  perphenazine (TRILAFON) 16 MG tablet, Take 1 pill total 16 mg every morning and take 2 pills by mouth total 32 mg every bedtime, Disp: 270 tablet, Rfl: 1 .  traZODone (DESYREL) 100 MG tablet, TAKE 3 TABLETS BY MOUTH EVERY DAY AT BEDTIME AS NEEDED FOR SLEEP, Disp: 270 tablet, Rfl:  1  Medication Side Effects: weight gain   Family Medical/ Social History: Changes? Yes I continue to encourage him to transfer his care to [facility] better access to Medicaid, disability, and chronic mental illness care in the community already working with [facility]  MENTAL HEALTH EXAM:  Height 6\' 3"  (1.905 m), weight 260 lb (117.9 kg).Body mass index is 32.5 kg/m. Muscle strengths and tone 5/5, postural reflexes and gait 0/0, and AIMS = 0.  General Appearance: Casual, Fairly Groomed, Guarded and Obese  Eye Contact:  Fair  Speech:  Clear and Coherent, Normal Rate and Talkative  Volume:  Normal  Mood:  Anxious, Dysphoric, Irritable and Worthless  Affect:  Congruent, Depressed, Inappropriate, Restricted and Anxious  Thought Process:  Coherent, Goal Directed, Irrelevant, Linear and Descriptions of Associations: Loose  Orientation:  Full (Time, Place, and Person)  Thought Content: Ilusions, Paranoid Ideation and Rumination   Suicidal Thoughts:  No  Homicidal Thoughts:  No  Memory:  Immediate;   Fair Remote;   Fair  Judgement:  Fair  Insight:  Lacking  Psychomotor Activity:  Decreased and Mannerisms  Concentration:  Concentration: Fair and Attention Span: Fair  Recall:  of Knowledge: Good  Language: Fair  Assets:  Desire for Improvement Resilience Talents/Skills  ADL's:  Intact  Cognition: WNL  Prognosis:  Poor    DIAGNOSES:    ICD-10-CM   1. Schizoaffective disorder, bipolar type without good prognostic features (HCC)  F25.0 clonazePAM (KLONOPIN) 2 MG tablet  2. Generalized anxiety disorder  F41.1 clonazePAM (KLONOPIN) 2 MG tablet    gabapentin (NEURONTIN) 600 MG tablet  3. Attention deficit hyperactivity disorder (ADHD), combined type, moderate  F90.2   4. OSA (obstructive sleep apnea)  G47.33     Receiving Psychotherapy: Yes  with [facility]    RECOMMENDATIONS: Medications are unchanged from last appointment except Klonopin is increased to 2 mg  twice daily #60 with 1 refill to replace existing directions 1 mg 3 times daily [facility] [address] for generalized anxiety and schizoaffective bipolar.  I also assume accountability for the need to replace the Neurontin 600 mg 3 times daily sent as #90 with [facility]ill [facility] [address] for his generalized anxiety and neuropathic pain.  Medications are numerous and otherwise unchanged today.  He has appointment in 6 weeks already scheduled from last session though I continue to encourage him to transfer to [facility] services.  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  He understands prevention and monitoring safety hygiene.  Evionnaz, MD

## 2019-12-10 ENCOUNTER — Other Ambulatory Visit: Payer: Self-pay | Admitting: Psychiatry

## 2019-12-10 DIAGNOSIS — F411 Generalized anxiety disorder: Secondary | ICD-10-CM

## 2019-12-10 DIAGNOSIS — F250 Schizoaffective disorder, bipolar type: Secondary | ICD-10-CM

## 2019-12-10 MED ORDER — CLONAZEPAM 2 MG PO TABS: 2.0000 mg | ORAL_TABLET | Freq: Two times a day (BID) | ORAL | 0 refills | Status: DC

## 2019-12-10 NOTE — Telephone Encounter (Signed)
Patient is out of Klonopin stressed by upcoming exam [place] registry documenting refill due 12/17/2019 exchanging messages with patient having early refill in the past unsuccessful to hopefully get back on schedule to prevent withdrawal but maintain efficacy sending 2 mg twice daily #14 with [facility]ill [facility] [address].

## 2019-12-10 NOTE — Telephone Encounter (Signed)
Pt would like to see if he can get approved a early refill on Clonazepam. Pt said that he was taking med to soon because of his anxiety and stress of his exams.

## 2019-12-11 ENCOUNTER — Other Ambulatory Visit: Payer: Self-pay | Admitting: Psychiatry

## 2019-12-11 NOTE — Telephone Encounter (Signed)
Pt called requesting early refill for Gabapentin. [facility] [address] apt 8/11

## 2019-12-11 NOTE — Telephone Encounter (Signed)
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

## 2019-12-30 ENCOUNTER — Ambulatory Visit (INDEPENDENT_AMBULATORY_CARE_PROVIDER_SITE_OTHER): Payer: Self-pay | Admitting: Psychiatry

## 2019-12-30 ENCOUNTER — Other Ambulatory Visit: Payer: Self-pay

## 2019-12-30 ENCOUNTER — Encounter: Payer: Self-pay | Admitting: Psychiatry

## 2019-12-30 VITALS — Ht 75.0 in | Wt 268.0 lb

## 2019-12-30 DIAGNOSIS — F411 Generalized anxiety disorder: Secondary | ICD-10-CM

## 2019-12-30 DIAGNOSIS — G4733 Obstructive sleep apnea (adult) (pediatric): Secondary | ICD-10-CM

## 2019-12-30 DIAGNOSIS — F250 Schizoaffective disorder, bipolar type: Secondary | ICD-10-CM

## 2019-12-30 DIAGNOSIS — F902 Attention-deficit hyperactivity disorder, combined type: Secondary | ICD-10-CM

## 2019-12-30 MED ORDER — AMPHETAMINE-DEXTROAMPHETAMINE 30 MG PO TABS: 30.0000 mg | ORAL_TABLET | Freq: Two times a day (BID) | ORAL | 0 refills | Status: DC

## 2019-12-30 MED ORDER — DULOXETINE HCL 60 MG PO CPEP: 60.0000 mg | ORAL_CAPSULE | Freq: Two times a day (BID) | ORAL | 0 refills | Status: DC

## 2019-12-30 MED ORDER — PERPHENAZINE 16 MG PO TABS: ORAL_TABLET | ORAL | 0 refills | Status: DC

## 2019-12-30 MED ORDER — GABAPENTIN 800 MG PO TABS: 800.0000 mg | ORAL_TABLET | Freq: Three times a day (TID) | ORAL | 0 refills | Status: DC

## 2019-12-30 MED ORDER — LITHIUM CARBONATE ER 300 MG PO TBCR: 600.0000 mg | EXTENDED_RELEASE_TABLET | Freq: Every day | ORAL | 0 refills | Status: DC

## 2019-12-30 MED ORDER — CLONAZEPAM 2 MG PO TABS: 2.0000 mg | ORAL_TABLET | Freq: Two times a day (BID) | ORAL | 2 refills | Status: DC | PRN

## 2019-12-30 NOTE — Progress Notes (Signed)
[facility]  Patient ID: Matthew [name],  MRN: [id number]  PCP: Patient, No Pcp Per  Date of Evaluation: 12/30/2019 Time spent:15 minutes from 1405 to 1420  Chief Complaint:  Chief Complaint    Depression; Hallucinations; ADHD; Anxiety      HISTORY/CURRENT STATUS: Matthew [name] is seen onsite in office 15 minutes face-to-face with consent with epic collateral for his request of 6-week evaluation and management of schizoaffective bipolar, generalized anxiety, and ADHD.  Patient has gained another 3 pounds in the course of 6 weeks weight going up as he has been preparing for [place] state license test which he passed in [place] thereby a huge accomplishment.  He states he still has not caught up on his continuing education credits, but this will be more straightforward than his licensing test.  He is also considering going back to technical college for becoming a Museum/gallery conservator as he has no response from multiple job applications other than from [employer] offering an interview but he is currently unwilling to relocate to work there living at [employer].  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  He knows that he is refused job consideration often because of his previous absenteeism and conflicts on the job.  Parents however are pleased that he has interest and is trying.  He reports that his restless legs and the shoulder pain from rotator cuffs are chronically impairing his sleep especially from the pain requesting more gabapentin again and expressing remorse for his overuse of Klonopin preparing for his CT exam.  He is taking 300 mg of trazodone for sleep but states that he has plenty of those tablets available.  He has no current mania, suicidality,  psychosis or delirium.   Individual Medical History/ Review of Systems: Changes? :Yes weight gain another 3 pounds in the course of 6 weeks needing his atenolol and other general medical issues addressed by primary care  Allergies: Gluten meal and Trazodone and nefazodone  Current Medications:  Current Outpatient Medications:  .  [START ON 01/21/2020] amphetamine-dextroamphetamine (ADDERALL) 30 MG tablet, Take 1 tablet by mouth 2 (two) times daily., Disp: 60 tablet, Rfl: 0 .  [START ON 02/20/2020] amphetamine-dextroamphetamine (ADDERALL) 30 MG tablet, Take 1 tablet by mouth 2 (two) times daily., Disp: 60 tablet, Rfl: 0 .  [START ON 03/21/2020] amphetamine-dextroamphetamine (ADDERALL) 30 MG tablet, Take 1 tablet by mouth 2 (two) times daily., Disp: 60 tablet, Rfl: 0 .  atenolol (TENORMIN) 100 MG tablet, Take 1 tablet (100 mg total) by mouth daily after breakfast., Disp: 90 tablet, Rfl: 1 .  clonazePAM (KLONOPIN) 2 MG tablet, Take 1 tablet (2 mg total) by mouth 2 (two) times daily., Disp: 14 tablet, Rfl: 0 .  clonazePAM (KLONOPIN) 2 MG tablet, Take 1 tablet (2 mg total) by mouth 2 (two) times daily as needed for anxiety., Disp: 60 tablet, Rfl: 2 .  DULoxetine (CYMBALTA) 60 MG capsule, Take 1 capsule (60 mg total) by mouth 2 (two) times daily., Disp: 180 capsule, Rfl: 0 .  fluticasone (FLONASE) 50 MCG/ACT nasal spray, Place 1 spray into both nostrils as needed for allergies or rhinitis. (Patient not taking: Reported on 10/21/2018), Disp: , Rfl: 2 .  gabapentin (NEURONTIN) 800 MG tablet, Take 1 tablet (800  mg total) by mouth 3 (three) times daily., Disp: 270 tablet, Rfl: 0 .  lithium carbonate (LITHOBID) 300 MG CR tablet, Take 2 tablets (600 mg total) by mouth at bedtime., Disp: 180 tablet, Rfl: 0 .  nicotine polacrilex (NICORETTE) 2 MG gum, Take 1 each (2 mg total) by mouth as needed for smoking cessation. (Patient not taking: Reported on 10/21/2018), Disp: 100 tablet, Rfl: 0 .  perphenazine (TRILAFON) 16  MG tablet, Take 1 pill total 16 mg every morning and take 2 pills by mouth total 32 mg every bedtime, Disp: 270 tablet, Rfl: 0 .  traZODone (DESYREL) 100 MG tablet, TAKE 3 TABLETS BY MOUTH EVERY DAY AT BEDTIME AS NEEDED FOR SLEEP, Disp: 270 tablet, Rfl: 1   Medication Side Effects: weight gain  Family Medical/ Social History: Changes? No  MENTAL HEALTH EXAM:  Height 6\' 3"  (1.905 m), weight 268 lb (121.6 kg).Body mass index is 33.5 kg/m.  Muscle strengths and tone 5/5, postural reflexes and gait 0/0, and AIMS = 0.  General Appearance: Casual, Fairly Groomed, Guarded and Obese having hair cut and beard  Eye Contact:  Fair  Speech:  Clear and Coherent, Normal Rate and Talkative  Volume:  Normal  Mood:  Anxious and Dysphoric  Affect:  Congruent, Depressed, Inappropriate, Restricted and Anxious  Thought Process:  Coherent, Goal Directed, Irrelevant, Linear and Descriptions of Associations: Loose  Orientation:  Full (Time, Place, and Person)  Thought Content: Ilusions, Paranoid Ideation and Rumination   Suicidal Thoughts:  No  Homicidal Thoughts:  No  Memory:  Immediate;   Fair Remote;   Fair  Judgement:  Fair  Insight:  Lacking  Psychomotor Activity:  Normal, Decreased and Mannerisms  Concentration:  Concentration: Fair and Attention Span: Fair  Recall:  of Knowledge: Good  Language: Fair  Assets:  Desire for Improvement Resilience Talents/Skills  ADL's:  Intact  Cognition: WNL  Prognosis:  Poor    DIAGNOSES:    ICD-10-CM   1. Schizoaffective disorder, bipolar type without good prognostic features (HCC)  F25.0 perphenazine (TRILAFON) 16 MG tablet    lithium carbonate (LITHOBID) 300 MG CR tablet    clonazePAM (KLONOPIN) 2 MG tablet  2. Generalized anxiety disorder  F41.1 DULoxetine (CYMBALTA) 60 MG capsule    clonazePAM (KLONOPIN) 2 MG tablet    gabapentin (NEURONTIN) 800 MG tablet  3. Attention deficit hyperactivity disorder (ADHD), combined type, moderate  F90.2  DULoxetine (CYMBALTA) 60 MG capsule    amphetamine-dextroamphetamine (ADDERALL) 30 MG tablet    amphetamine-dextroamphetamine (ADDERALL) 30 MG tablet    amphetamine-dextroamphetamine (ADDERALL) 30 MG tablet  4. OSA (obstructive sleep apnea)  G47.33     Receiving Psychotherapy: Yes  counselor and an employment specialist from [employer]   RECOMMENDATIONS: Adaptive efforts underway in other parts of the community are not repeated but acknowledged today.  He has adequate supply of trazodone stating he is taking 300 mg as 3 or the 100 mg tablets at night when he needs it for insomnia.  His Neurontin is increased to 800 mg 3 times daily up from previous 600 sent as a 90-day supply with [facility]ill [facility] [address] for neuropathic pain and restless legs.  He is E scribed Lithium 300 mg CR as 2 tablets every bedtime sent as #180 with [facility] [address] for schizoaffective bipolar.  Trilafon is renewed at 16 mg tablet taking 1 every morning and 2 every bedtime #270 with no refill for schizoaffective bipolar  and E scribed Cymbalta 60 mg twice daily #180 with no refill for schizoaffective bipolar and generalized anxiety as well as ADHD sent [facility] [address].Marland Kitchen  He is E scribed Klonopin 2 mg twice daily #60 with 2 refills [facility] [address] for generalized anxiety.  He is E scribed Adderall 30 mg IR tablet twice daily #60 each for September 2, October 2, and November 1 for ADHD [facility] [address].  He returns in 3 months or transfers care to [facility] and [facility] equivalents through [place].   Chauncey Mann, MD

## 2020-02-10 ENCOUNTER — Other Ambulatory Visit: Payer: Self-pay | Admitting: Psychiatry

## 2020-02-10 DIAGNOSIS — F902 Attention-deficit hyperactivity disorder, combined type: Secondary | ICD-10-CM

## 2020-02-10 DIAGNOSIS — F411 Generalized anxiety disorder: Secondary | ICD-10-CM

## 2020-02-10 DIAGNOSIS — F250 Schizoaffective disorder, bipolar type: Secondary | ICD-10-CM

## 2020-02-10 MED ORDER — CLONAZEPAM 2 MG PO TABS: 2.0000 mg | ORAL_TABLET | Freq: Two times a day (BID) | ORAL | 0 refills | Status: DC | PRN

## 2020-02-10 NOTE — Telephone Encounter (Signed)
Patient has again as he did 2 months ago take extra Klonopin or lost supply so that he is only short before refill is possible at [facility] [address].  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

## 2020-02-10 NOTE — Telephone Encounter (Signed)
Pt lm requesting an early refill on his Klonopin. His last appt was 8/11 with no follow up scheduled.

## 2020-02-10 NOTE — Telephone Encounter (Signed)
Rx was submitted on 08/11 with 2 refills.  Grover Canavan, please check if they have on file

## 2020-02-10 NOTE — Telephone Encounter (Signed)
Please review. Requesting early refill.

## 2020-02-10 NOTE — Telephone Encounter (Signed)
Niccolas requested an "early" refill of his Klonopin.  He last filled it 01/16/20 and he took more on a few days so is out early.  He is requesting that you called the pharmacy to approve him to be able to pick up his refill of the prescription today.   He did have an appt scheduled but we cancelled it because GJ is going to be out of the office that day.  He is now Rs to 11/10.

## 2020-02-11 NOTE — Telephone Encounter (Signed)
Reviewed

## 2020-02-17 ENCOUNTER — Ambulatory Visit
Admission: RE | Admit: 2020-02-17 | Discharge: 2020-02-17 | Disposition: A | Discharge status: Home / Self Care | Payer: Medicaid Other | Source: Ambulatory Visit | Attending: Family Medicine | Admitting: Family Medicine

## 2020-02-17 ENCOUNTER — Other Ambulatory Visit: Payer: Self-pay | Admitting: Family Medicine

## 2020-02-17 DIAGNOSIS — R531 Weakness: Secondary | ICD-10-CM

## 2020-02-17 DIAGNOSIS — M25611 Stiffness of right shoulder, not elsewhere classified: Secondary | ICD-10-CM

## 2020-02-17 IMAGING — CR DG SHOULDER 2+V*R*
3 series · 3 of 3 positions shown · non-contrast
Comparison: None.

CLINICAL DATA: Decreased abduction of the right shoulder for the
past 2 years. History of previous right shoulder dislocations.

EXAM:
RIGHT SHOULDER - 2+ VIEW

[w shoulder ap internal righ *]
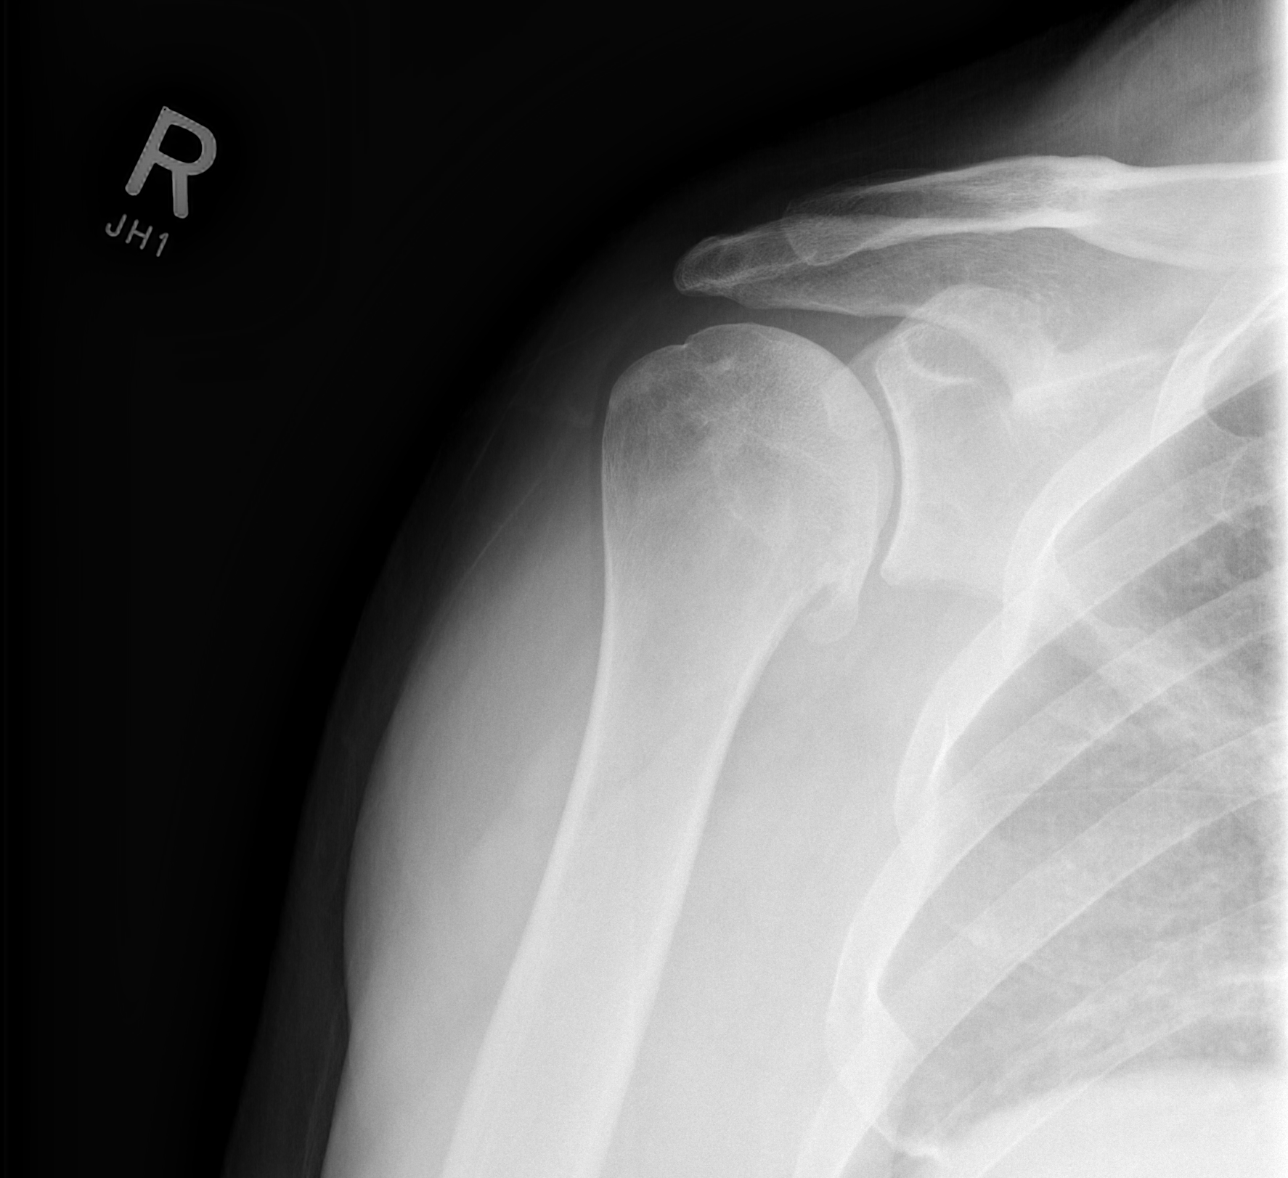

[w shoulder ap external righ *]
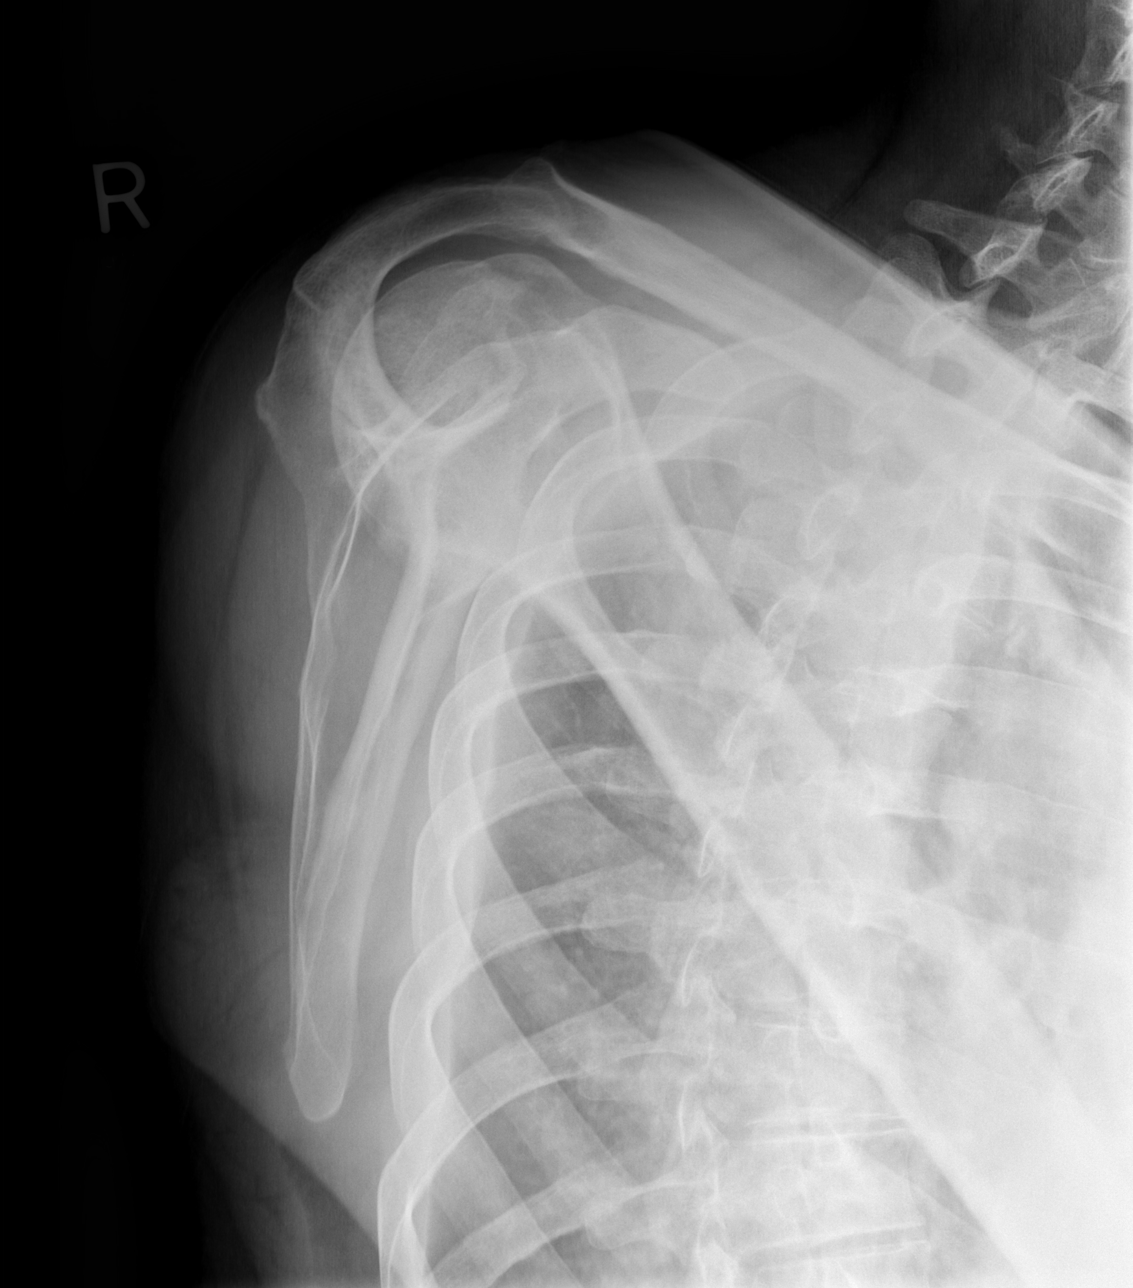

[w shoulder axillary right *]
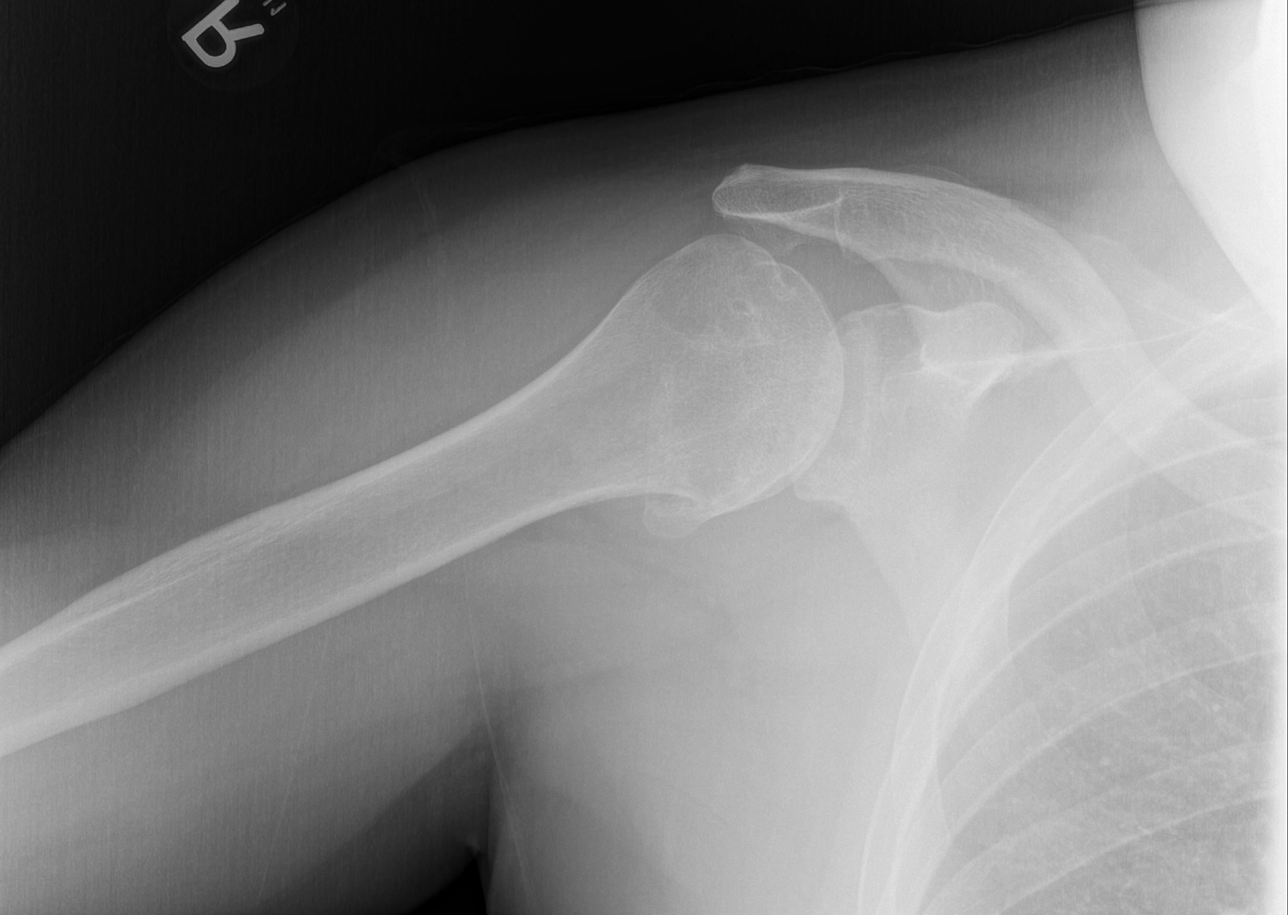

[3 of 3 positions shown; findings below may reference images not displayed]

FINDINGS: No fracture or dislocation. Severe degenerative change of the right
glenohumeral joint with joint space loss, subchondral sclerosis and
inferiorly directed osteophytosis. Acromioclavicular joint spaces
appear preserved. No evidence of calcific tendinitis. Limited
visualization adjacent thorax is normal.
IMPRESSION: Severe degenerative change of the right glenohumeral joint,
potentially the sequela of provided history of previous shoulder
dislocations, though note inflammatory etiologies (such as
rheumatoid arthritis), could result in a similar appearance.
Clinical correlation is advised.

## 2020-02-23 ENCOUNTER — Other Ambulatory Visit: Payer: Self-pay

## 2020-02-23 DIAGNOSIS — F411 Generalized anxiety disorder: Secondary | ICD-10-CM

## 2020-02-23 MED ORDER — ATENOLOL 100 MG PO TABS: 100.0000 mg | ORAL_TABLET | Freq: Every day | ORAL | 0 refills | Status: DC

## 2020-03-10 ENCOUNTER — Encounter: Payer: Self-pay | Admitting: Psychiatry

## 2020-03-10 ENCOUNTER — Other Ambulatory Visit: Payer: Self-pay | Admitting: Psychiatry

## 2020-03-10 DIAGNOSIS — F411 Generalized anxiety disorder: Secondary | ICD-10-CM

## 2020-03-10 DIAGNOSIS — F250 Schizoaffective disorder, bipolar type: Secondary | ICD-10-CM

## 2020-03-10 DIAGNOSIS — F902 Attention-deficit hyperactivity disorder, combined type: Secondary | ICD-10-CM

## 2020-03-10 MED ORDER — CLONAZEPAM 2 MG PO TABS: 2.0000 mg | ORAL_TABLET | Freq: Two times a day (BID) | ORAL | 0 refills | Status: DC | PRN

## 2020-03-10 NOTE — Telephone Encounter (Signed)
Patient again overuses Klonopin 2 mg twice daily as needed out of supply 6 days before refill possible likely requiring reduction or discontinuation of the Adderall to contain the need for extra Klonopin other issues were also addressed at next appointment. Additional supply #12 tablets of [facility]opin 2 [facility] [address] to cover until time for remaining refill seizurethen 03/29/2020 appointment.

## 2020-03-10 NOTE — Telephone Encounter (Signed)
Matthew [name] called to request early refill of his Klonopin.  Last filled 02/15/20 but is out now.  Has appt 03/29/20.  [facility] [address]  If not approved, please let him know.

## 2020-03-25 ENCOUNTER — Other Ambulatory Visit: Payer: Self-pay | Admitting: Psychiatry

## 2020-03-25 DIAGNOSIS — F411 Generalized anxiety disorder: Secondary | ICD-10-CM

## 2020-03-25 DIAGNOSIS — F250 Schizoaffective disorder, bipolar type: Secondary | ICD-10-CM

## 2020-03-28 ENCOUNTER — Other Ambulatory Visit: Payer: Self-pay | Admitting: Psychiatry

## 2020-03-28 DIAGNOSIS — F411 Generalized anxiety disorder: Secondary | ICD-10-CM

## 2020-03-28 DIAGNOSIS — F902 Attention-deficit hyperactivity disorder, combined type: Secondary | ICD-10-CM

## 2020-03-28 NOTE — Telephone Encounter (Signed)
Has apt tomorrow 11/09

## 2020-03-28 NOTE — Telephone Encounter (Signed)
Apt tomorrow 11/09

## 2020-03-29 ENCOUNTER — Other Ambulatory Visit: Payer: Self-pay

## 2020-03-29 ENCOUNTER — Encounter: Payer: Self-pay | Admitting: Psychiatry

## 2020-03-29 ENCOUNTER — Ambulatory Visit (INDEPENDENT_AMBULATORY_CARE_PROVIDER_SITE_OTHER): Payer: Self-pay | Admitting: Psychiatry

## 2020-03-29 VITALS — Ht 75.0 in | Wt 264.0 lb

## 2020-03-29 DIAGNOSIS — F250 Schizoaffective disorder, bipolar type: Secondary | ICD-10-CM

## 2020-03-29 DIAGNOSIS — F411 Generalized anxiety disorder: Secondary | ICD-10-CM

## 2020-03-29 DIAGNOSIS — G4733 Obstructive sleep apnea (adult) (pediatric): Secondary | ICD-10-CM

## 2020-03-29 DIAGNOSIS — F902 Attention-deficit hyperactivity disorder, combined type: Secondary | ICD-10-CM

## 2020-03-29 MED ORDER — AMPHETAMINE-DEXTROAMPHETAMINE 30 MG PO TABS: 30.0000 mg | ORAL_TABLET | Freq: Two times a day (BID) | ORAL | 0 refills | Status: DC

## 2020-03-29 MED ORDER — TRAZODONE HCL 100 MG PO TABS: ORAL_TABLET | ORAL | 1 refills | Status: DC

## 2020-03-29 MED ORDER — CLONAZEPAM 2 MG PO TABS: 2.0000 mg | ORAL_TABLET | Freq: Two times a day (BID) | ORAL | 5 refills | Status: DC | PRN

## 2020-03-29 MED ORDER — PERPHENAZINE 16 MG PO TABS: ORAL_TABLET | ORAL | 1 refills | Status: DC

## 2020-03-29 NOTE — Progress Notes (Signed)
[facility]  Patient ID: Matthew [name],  MRN: [id number]  PCP: Patient, No Pcp Per  Date of Evaluation: 03/29/2020 Time spent:15 minutes from 1505 to 1520  Chief Complaint:  Chief Complaint    Depression; Manic Behavior; Hallucinations; Anxiety; ADHD      HISTORY/CURRENT STATUS: Carston is seen onsite in office 15 minutes face-to-face individually with consent with epic collateral for psychiatric interview and exam in 64-month evaluation and management of medications for schizoaffective bipolar, generalized anxiety, ADHD, and obstructive sleep apnea.  Over the course of 4-1/2 years of care, he has separated from living with elderly parents in their 32s with their own health concerns after loss of his employment now on [place]. 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  Patient is aware that he alienates others in his schizoaffective posture more on the depressed side having significant anxiety himself to make social cues and comments but never being violent or destructive.  He does not take care of himself not fully starting primary care despite expectation and not having further laboratory assessment since June 2020 despite abnormalities needing general medical care.  Psychiatric and some general medical needs have been covered while continuing attempts to minimize any impact on his metabolic disorders. He is not losing weight or yet medically addressing even his atenolol needing a general medical provider.  He began and discontinued from [facility] requesting to obtained his medications here until established in successful employment and medical and social competence by adapt himself away from family on his own with  self-directed success in meeting his basic needs initially medically.  Not manic, psychotic, delirious or suicidal today though he ran out of his Trilafon several days ago and finds that hallucinations are recurring.   Individual Medical History/ Review of Systems: Changes? :No   0 Result Notes  Ref Range & Units 1 yr ago  (10/21/18) 5 yr ago  (02/08/15) 5 yr ago  (12/29/14) 5 yr ago  (09/25/14)  Total Protein 6.5 - 8.1 g/dL 7.2  7.6  7.0  7.1   Albumin 3.5 - 5.0 g/dL 4.3  4.4  4.4  4.4   AST 15 - 41 U/L 24  45High  34  26   ALT 0 - 44 U/L 46High  57 R  39 R  38 R   Alkaline Phosphatase 38 - 126 U/L 75  81  58  54   Total Bilirubin 0.3 - 1.2 mg/dL 0.4  0.7  0.9  0.6   Bilirubin, Direct 0.0 - 0.2 mg/dL <6.1            Ref Range & Units 1 yr ago  Direct LDL 0 - 99 mg/dL 950.3High     Ref Range & Units 1 yr ago 5 yr ago  Cholesterol 0 - 200 mg/dL 932IZTI  458KDXI   Triglycerides <150 mg/dL 338SNKN  397QBHA   HDL >40 mg/dL 19FXT  46   Total CHOL/HDL Ratio RATIO 9.9  5.3   VLDL 0 - 40 mg/dL UNABLE TO CALCULATE IF TRIGLYCERIDE OVER 400 mg/dL  02IOXB   LDL Cholesterol 0 - 99 mg/dL UNABLE TO CALCULATE IF TRIGLYCERIDE OVER 400 mg/dL  353GDJM CM     Ref Range & Units 1 yr ago 5 yr ago  Hgb A1c  MFr Bld 4.8 - 5.6 % 6.4High  6.4High CM   Comment: (NOTE)  Pre diabetes:     5.7%-6.4%  Diabetes:       >6.4%  Glycemic control for  <7.0%  adults with diabetes     Ref Range & Units 1 yr ago 5 yr ago  TSH 0.350 - 4.500 uIU/mL 7.418High  1.688 CM     Ref Range & Units 1 yr ago  Lithium Lvl 0.60 - 1.20 mmol/L 0.67      Allergies: Gluten meal and by history Nefazadone  Current Medications:  Current Outpatient Medications:  .  [START ON 05/20/2020] amphetamine-dextroamphetamine (ADDERALL) 30 MG tablet, Take 1 tablet by mouth 2 (two) times daily., Disp: 60 tablet, Rfl: 0 .  [START ON 06/19/2020] amphetamine-dextroamphetamine (ADDERALL) 30 MG tablet, Take 1  tablet by mouth 2 (two) times daily., Disp: 60 tablet, Rfl: 0 .  [START ON 04/20/2020] amphetamine-dextroamphetamine (ADDERALL) 30 MG tablet, Take 1 tablet by mouth 2 (two) times daily., Disp: 60 tablet, Rfl: 0 .  atenolol (TENORMIN) 100 MG tablet, TAKE 1 TABLET (100 MG TOTAL) BY MOUTH DAILY AFTER BREAKFAST., Disp: 90 tablet, Rfl: 1 .  clonazePAM (KLONOPIN) 2 MG tablet, Take 1 tablet (2 mg total) by mouth 2 (two) times daily as needed for anxiety., Disp: 60 tablet, Rfl: 5 .  DULoxetine (CYMBALTA) 60 MG capsule, TAKE 1 CAPSULE BY MOUTH 2 TIMES DAILY., Disp: 180 capsule, Rfl: 1 .  fluticasone (FLONASE) 50 MCG/ACT nasal spray, Place 1 spray into both nostrils as needed for allergies or rhinitis. (Patient not taking: Reported on 10/21/2018), Disp: , Rfl: 2 .  gabapentin (NEURONTIN) 800 MG tablet, TAKE 1 TABLET BY MOUTH THREE TIMES A DAY, Disp: 270 tablet, Rfl: 1 .  lithium carbonate (LITHOBID) 300 MG CR tablet, TAKE 2 TABLETS (600 MG TOTAL) BY MOUTH AT BEDTIME., Disp: 180 tablet, Rfl: 1 .  nicotine polacrilex (NICORETTE) 2 MG gum, Take 1 each (2 mg total) by mouth as needed for smoking cessation. (Patient not taking: Reported on 10/21/2018), Disp: 100 tablet, Rfl: 0 .  perphenazine (TRILAFON) 16 MG tablet, Take 1 pill total 16 mg every morning and take 2 pills by mouth total 32 mg every bedtime, Disp: 270 tablet, Rfl: 1 .  traZODone (DESYREL) 100 MG tablet, TAKE 3 TABLETS BY MOUTH EVERY DAY AT BEDTIME AS NEEDED FOR SLEEP, Disp: 270 tablet, Rfl: 1   Medication Side Effects: weight gain  Family Medical/ Social History: Changes? No  MENTAL HEALTH EXAM:  Height 6\' 3"  (1.905 m), weight 264 lb (119.7 kg).Body mass index is 33 kg/m. Muscle strengths and tone 5/5, postural reflexes and gait 0/0, and AIMS = 0.  General Appearance: Casual, Fairly Groomed, Guarded, Meticulous and Obese  Eye Contact:  Fair  Speech:  Clear and Coherent and Normal Rate  Volume:  Normal  Mood:  Anxious, Dysphoric and Worthless   Affect:  Congruent, Depressed, Inappropriate, Restricted and Anxious  Thought Process:  Coherent, Irrelevant, Linear and Descriptions of Associations: Loose  Orientation:  Full (Time, Place, and Person)  Thought Content: Delusions, Hallucinations: Auditory, Paranoid Ideation and Rumination   Suicidal Thoughts:  No  Homicidal Thoughts:  No  Memory:  Immediate;   Fair Remote;   Fair  Judgement:  Fair  Insight:  Lacking  Psychomotor Activity:  Normal, Decreased and Mannerisms  Concentration:  Concentration: Fair and Attention Span: Fair  Recall:  of Knowledge: Good  Language: Fair  Assets:  Desire for Improvement Resilience  Talents/Skills  ADL's:  Intact  Cognition: WNL  Prognosis:  Poor    DIAGNOSES:    ICD-10-CM   1. Schizoaffective disorder, bipolar type without good prognostic features (HCC)  F25.0 clonazePAM (KLONOPIN) 2 MG tablet    traZODone (DESYREL) 100 MG tablet    perphenazine (TRILAFON) 16 MG tablet  2. Generalized anxiety disorder  F41.1 clonazePAM (KLONOPIN) 2 MG tablet    traZODone (DESYREL) 100 MG tablet  3. Attention deficit hyperactivity disorder (ADHD), combined type, moderate  F90.2 amphetamine-dextroamphetamine (ADDERALL) 30 MG tablet    amphetamine-dextroamphetamine (ADDERALL) 30 MG tablet    amphetamine-dextroamphetamine (ADDERALL) 30 MG tablet  4. OSA (obstructive sleep apnea)  G47.33     Receiving Psychotherapy: Yes with [facility]   RECOMMENDATIONS: Request an interim supply of medications as closure of his care by myself for imminent retirement is completed as begun 3 months ago and last appointment.  Last several years, he is extremely slow to make any changes in his psychosocial approach to his mental and medical health care.  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  [facility] [address] [facility].  He is E scribed Adderall 30 mg IR twice daily sent as #60 each for December 1, December 31, and January 30 for ADHD with [place] registry documenting last Adderall on 03/21/2020 and Klonopin 03/15/2020 appropriate in the last year other than needing several times an extra week of Klonopin however his excessive use when highly anxious applying himself to study for exams etc.  His E scribed Klonopin 2 mg twice daily sent as #60 with 5 refills for generalized anxiety disorder and sleep apnea.  He is E scribed Neurontin 800 mg 3 times daily #270 with 1 refill for generalized anxiety.  He is E scribed Cymbalta 60 mg twice daily #180 with 1 refill for schizoaffective bipolar, generalized anxiety, and ADHD.  He is E scribed lithium 300 mg CR taking 2 tablets every bedtime sent as #180 with 1 refill for schizoaffective bipolar.  He is E scribed perphenazine 16 mg taking 1 every morning and 2 every bedtime sent as #270 with 1 refill for schizoaffective bipolar.  He is E scribed trazodone 100 mg taking 3 every bedtime #270 with 1 refill for insomnia not allergic to trazodone though apparently having side effects from nefazodone in the past.  He is E scribed atenolol 100 mg every morning sent as #90 with 1 refill for anxiety taken this for hypertension in the past from his past general medical care.  He needs follow-up in 3 months but has medication supply for 6 months as he is encouraged to return to care at [facility] as he continues supportive therapy with [facility] and [facility].  Chauncey Mann, MD

## 2020-03-29 NOTE — Telephone Encounter (Signed)
Pt called [facility]. Completely out of most meds. Apt today @ 3PM

## 2020-03-30 ENCOUNTER — Ambulatory Visit: Payer: Medicaid Other | Admitting: Psychiatry

## 2020-03-31 ENCOUNTER — Ambulatory Visit: Payer: Medicaid Other | Admitting: Psychiatry

## 2020-04-06 ENCOUNTER — Telehealth: Payer: Self-pay | Admitting: Psychiatry

## 2020-04-06 DIAGNOSIS — F411 Generalized anxiety disorder: Secondary | ICD-10-CM

## 2020-04-06 MED ORDER — CLONAZEPAM 1 MG PO TABS: ORAL_TABLET | ORAL | 0 refills | Status: DC

## 2020-04-06 NOTE — Telephone Encounter (Signed)
Pt called and said that he needs the doctor to call and ok an early refill on his klonopin since he picked up his last 10/28.to the pharmacy

## 2020-04-06 NOTE — Telephone Encounter (Signed)
Patient expects early refill of Klonopin despite intervention at last appointment as we processed options establishing Medicaid care at [facility] as best option but he complainedthat better regulation processes they would not prescribe his Klonopin or Adderall recalls now having overused the medication again.  He explains that he has feelings of anxiety from reduction of his blood sugar due to the new oral hypoglycemic from his PCP.  I explained that better regulation of glucose more appropriate than taking a benzodiazepine for such.  The patient then states he has to see family at Thanksgiving next week and is anxious having only 2 Klonopin left having anxiety about that visit.  He prefers to resume his twice daily dose appropriately while I suggested he taper and discontinue the Klonopin sending a taper reduction scheduled for 16 days with the 1 mg tablet from 1 tablet 3 times daily for 4 days to then take 1 tablet twice daily for 4 days, then 1 tablet daily for 4 days, then 1/2 tablet daily for 4 days then stop.

## 2020-04-11 ENCOUNTER — Other Ambulatory Visit: Payer: Self-pay

## 2020-04-11 ENCOUNTER — Encounter: Payer: Self-pay | Admitting: Psychiatry

## 2020-04-11 ENCOUNTER — Ambulatory Visit (INDEPENDENT_AMBULATORY_CARE_PROVIDER_SITE_OTHER): Payer: Self-pay | Admitting: Psychiatry

## 2020-04-11 VITALS — Ht 75.0 in | Wt 269.0 lb

## 2020-04-11 DIAGNOSIS — F250 Schizoaffective disorder, bipolar type: Secondary | ICD-10-CM

## 2020-04-11 DIAGNOSIS — F411 Generalized anxiety disorder: Secondary | ICD-10-CM

## 2020-04-11 DIAGNOSIS — F902 Attention-deficit hyperactivity disorder, combined type: Secondary | ICD-10-CM

## 2020-04-11 DIAGNOSIS — G4733 Obstructive sleep apnea (adult) (pediatric): Secondary | ICD-10-CM

## 2020-04-11 NOTE — Progress Notes (Signed)
[facility]  Patient ID: SHELLY SHOULTZ,  MRN: [id number]  PCP: Patient, No Pcp Per  Date of Evaluation: 04/11/2020 Time spent:15 minutes from 1525 to 1540  Chief Complaint:  Chief Complaint    Hallucinations; Depression; Manic Behavior; Anxiety; ADHD      HISTORY/CURRENT STATUS: Trevone is seen onsite in office 15 minutes face-to-face individually with consent with epic collateral for psychiatric interview and exam in 2-week evaluation and management of schizoaffective bipolar, generalized anxiety, ADHD, and obstructive sleep apnea.  The patient appears to have a chronic history of underachievement likely associated with ADHD then exacerbated with evolution of schizoaffective bipolar with age more often depressed.  When he runs out of Trilafon recently he reported auditory hallucinations now improved since renewing and restoring his perphenazine.  He has reasonable community care except that he refuses to attend [facility] for his medications as he expects they will not prescribe his Klonopin and Adderall.  The patient has over the last several months continually expected early refill or supplemental supply of Klonopin as he overuses it before the next refill.  We had only provided supplemental supply until the next refill expecting him to private pay the extra balance until his phone call November 17 from which taper and discontinuation schedule for Kl[facility]opin was [facility] [address] of 26 tablets 1 mg each which he did fill per [place] registry.  The patient expects more Klonopin today though he does not exhibit psychosis, withdrawal delirium, high anxiety, or intoxication, currently remaining as he was on 04/06/2020 with only 2 of the Klonopin 2 mg remaining as he expected brother and other family members to assemble for Thanksgiving considering that stressful then but not today.  However he again today states that his treatment for diabetes with glucose lowering agents has  left him anxious, but he does not document hypoglycemia nor is such likely expected.  He is still encouraged to work with his primary care staff in that regard rather than expecting more benzodiazepines for that purpose.  I clarified for him the extent of his medications covering every aspect of anxiety from the Neurontin to Tenormin to Cymbalta to Trilafon and the Klonopin, as we attempt to clarify that he has no indication that the Adderall is the cause of his anxiety but understands that Adderall could increase anxiety so that trying off it that may be warranted if he continues to overuse his supply of Klonopin. He discusses reducing his Klonopin to 1 mg twice daily for the future possibly having a remaining supply of 2 mg twice daily not certain if [facility] discontinued any future  Refills as  the taper reduction supply was provided last week.  He has no mania, suicidality, active psychosis today or delirium.   Individual Medical History/ Review of Systems: Changes? :Yes Weight gain of 5 pounds in 2 weeks  Allergies: Gluten meal  Current Medications:  Current Outpatient Medications:  .  [START ON 05/20/2020] amphetamine-dextroamphetamine (ADDERALL) 30 MG tablet, Take 1 tablet by mouth 2 (two) times daily., Disp: 60 tablet, Rfl: 0 .  [START ON 06/19/2020] amphetamine-dextroamphetamine (ADDERALL) 30 MG tablet, Take 1 tablet by mouth 2 (two) times daily., Disp: 60 tablet, Rfl: 0 .  [START ON 04/20/2020] amphetamine-dextroamphetamine (ADDERALL) 30 MG tablet, Take 1 tablet by mouth 2 (two) times daily., Disp: 60 tablet, Rfl: 0 .  atenolol (TENORMIN) 100 MG tablet, TAKE 1 TABLET (100 MG TOTAL) BY MOUTH DAILY AFTER BREAKFAST., Disp: 90 tablet, Rfl: 1 .  clonazePAM (KLONOPIN) 1 MG tablet, I po tid X 4 days, then 1 po bid X 4 days, then 1 po daily for 4 days, then 1/2 po daily for 4 days then stop, Disp: 26 tablet, Rfl: 0 .  clonazePAM (KLONOPIN) 2 MG tablet, Take 1 tablet (2 mg total) by mouth 2 (two) times  daily as needed for anxiety., Disp: 60 tablet, Rfl: 5 .  DULoxetine (CYMBALTA) 60 MG capsule, TAKE 1 CAPSULE BY MOUTH 2 TIMES DAILY., Disp: 180 capsule, Rfl: 1 .  fluticasone (FLONASE) 50 MCG/ACT nasal spray, Place 1 spray into both nostrils as needed for allergies or rhinitis. (Patient not taking: Reported on 10/21/2018), Disp: , Rfl: 2 .  gabapentin (NEURONTIN) 800 MG tablet, TAKE 1 TABLET BY MOUTH THREE TIMES A DAY, Disp: 270 tablet, Rfl: 1 .  lithium carbonate (LITHOBID) 300 MG CR tablet, TAKE 2 TABLETS (600 MG TOTAL) BY MOUTH AT BEDTIME., Disp: 180 tablet, Rfl: 1 .  nicotine polacrilex (NICORETTE) 2 MG gum, Take 1 each (2 mg total) by mouth as needed for smoking cessation. (Patient not taking: Reported on 10/21/2018), Disp: 100 tablet, Rfl: 0 .  perphenazine (TRILAFON) 16 MG tablet, Take 1 pill total 16 mg every morning and take 2 pills by mouth total 32 mg every bedtime, Disp: 270 tablet, Rfl: 1 .  traZODone (DESYREL) 100 MG tablet, TAKE 3 TABLETS BY MOUTH EVERY DAY AT BEDTIME AS NEEDED FOR SLEEP, Disp: 270 tablet, Rfl: 1  Medication Side Effects: anxiety  Family Medical/ Social History: Changes? No  MENTAL HEALTH EXAM:  Height 6\' 3"  (1.905 m), weight 269 lb (122 kg).Body mass index is 33.62 kg/m. Muscle strengths and tone 5/5, postural reflexes and gait 0/0, and AIMS = 0.  General Appearance: Casual, Disheveled, Guarded, Meticulous and Obese  Eye Contact:  Fair  Speech:  Clear and Coherent and Normal Rate  Volume:  Normal  Mood:  Anxious, Dysphoric, Euthymic and Worthless  Affect:  Depressed, Inappropriate, Restricted and Anxious  Thought Process:  Coherent, Irrelevant, Linear and Descriptions of Associations: Loose  Orientation:  Full (Time, Place, and Person)  Thought Content: Paranoid Ideation, Rumination and Tangential   Suicidal Thoughts:  No  Homicidal Thoughts:  No  Memory:  Immediate;   Fair Remote;   Fair  Judgement:  Impaired  Insight:  Lacking  Psychomotor Activity:   Normal, Decreased and Mannerisms  Concentration:  Concentration: Fair and Attention Span: Fair  Recall:  of Knowledge: Good  Language: Fair  Assets:  Leisure Time Resilience Talents/Skills  ADL's:  Intact  Cognition: WNL  Prognosis:  Poor    DIAGNOSES:    ICD-10-CM   1. Schizoaffective disorder, bipolar type without good prognostic features (HCC)  F25.0   2. Generalized anxiety disorder  F41.1   3. Attention deficit hyperactivity disorder (ADHD), combined type, moderate  F90.2   4. OSA (obstructive sleep apnea)  G47.33     Receiving Psychotherapy: Yes  with [facility] and suggesting [employer] and [place].    RECOMMENDATIONS: No additional eScriptions are given today as he has the Klonopin 1 mg reduction scheduled from November 17 as well as possibly the existing 2 mg twice daily eScription from 4-month supply at last appointment November 9.  He also takes Cymbalta 60 mg twice daily, Trilafon 16 mg tablet taking 1 every morning 2 every evening, trazodone 100 mg tablet taking up to 3 nightly for sleep if needed, Neurontin 800 mg tablet 3 times daily,  and Tenormin 100 mg tablet after breakfast daily all for generalized anxiety and schizoaffective bipolar.  He has Adderall 30 mg IR tablet twice daily for ADHD.  He has lithium carbonate 600 mg CR every bedtime for schizoaffective bipolar.  He equests 2 copies each of the last 2 disability statements he and family requested for disability determination services and attorneys which are provided.  He states he may pursue advanced practitioner follow-up in this office though he is encouraged to start with [facility] as soon as possible.    Chauncey Mann, MD

## 2020-04-12 ENCOUNTER — Encounter: Payer: Self-pay | Admitting: Psychiatry

## 2020-04-19 ENCOUNTER — Emergency Department (HOSPITAL_COMMUNITY)
Admission: EM | Admit: 2020-04-19 | Discharge: 2020-04-19 | Disposition: A | Discharge status: Home / Self Care | Payer: Medicaid Other | Attending: Emergency Medicine | Admitting: Emergency Medicine

## 2020-04-19 ENCOUNTER — Encounter (HOSPITAL_COMMUNITY): Payer: Self-pay | Admitting: Emergency Medicine

## 2020-04-19 ENCOUNTER — Other Ambulatory Visit: Payer: Self-pay

## 2020-04-19 ENCOUNTER — Emergency Department (HOSPITAL_COMMUNITY): Payer: Medicaid Other

## 2020-04-19 DIAGNOSIS — Z20822 Contact with and (suspected) exposure to covid-19: Secondary | ICD-10-CM | POA: Diagnosis not present

## 2020-04-19 DIAGNOSIS — Z79899 Other long term (current) drug therapy: Secondary | ICD-10-CM | POA: Diagnosis not present

## 2020-04-19 DIAGNOSIS — I10 Essential (primary) hypertension: Secondary | ICD-10-CM | POA: Diagnosis not present

## 2020-04-19 DIAGNOSIS — F17210 Nicotine dependence, cigarettes, uncomplicated: Secondary | ICD-10-CM | POA: Insufficient documentation

## 2020-04-19 DIAGNOSIS — R079 Chest pain, unspecified: Secondary | ICD-10-CM | POA: Diagnosis present

## 2020-04-19 DIAGNOSIS — F13230 Sedative, hypnotic or anxiolytic dependence with withdrawal, uncomplicated: Secondary | ICD-10-CM

## 2020-04-19 DIAGNOSIS — F19230 Other psychoactive substance dependence with withdrawal, uncomplicated: Secondary | ICD-10-CM | POA: Diagnosis not present

## 2020-04-19 DIAGNOSIS — R739 Hyperglycemia, unspecified: Secondary | ICD-10-CM | POA: Diagnosis not present

## 2020-04-19 DIAGNOSIS — F13930 Sedative, hypnotic or anxiolytic use, unspecified with withdrawal, uncomplicated: Secondary | ICD-10-CM

## 2020-04-19 DIAGNOSIS — F419 Anxiety disorder, unspecified: Secondary | ICD-10-CM | POA: Diagnosis not present

## 2020-04-19 DIAGNOSIS — R002 Palpitations: Secondary | ICD-10-CM | POA: Diagnosis not present

## 2020-04-19 DIAGNOSIS — R7303 Prediabetes: Secondary | ICD-10-CM | POA: Insufficient documentation

## 2020-04-19 LAB — RESP PANEL BY RT-PCR (FLU A&B, COVID) ARPGX2
Influenza A by PCR: NEGATIVE
Influenza B by PCR: NEGATIVE
SARS Coronavirus 2 by RT PCR: NEGATIVE

## 2020-04-19 LAB — BASIC METABOLIC PANEL
Anion gap: 15 (ref 5–15)
BUN: 12 mg/dL (ref 6–20)
CO2: 19 mmol/L — ABNORMAL LOW (ref 22–32)
Calcium: 9.9 mg/dL (ref 8.9–10.3)
Chloride: 99 mmol/L (ref 98–111)
Creatinine, Ser: 1.08 mg/dL (ref 0.61–1.24)
GFR, Estimated: >60 mL/min (ref >60)
Glucose, Bld: 197 mg/dL — ABNORMAL HIGH (ref 70–99)
Potassium: 4.6 mmol/L (ref 3.5–5.1)
Sodium: 133 mmol/L — ABNORMAL LOW (ref 135–145)

## 2020-04-19 LAB — CBC
HCT: 51 % (ref 39.0–52.0)
Hemoglobin: 17 g/dL (ref 13.0–17.0)
MCH: 28.3 pg (ref 26.0–34.0)
MCHC: 33.3 g/dL (ref 30.0–36.0)
MCV: 85 fL (ref 80.0–100.0)
Platelets: 268 10*3/uL (ref 150–400)
RBC: 6 MIL/uL — ABNORMAL HIGH (ref 4.22–5.81)
RDW: 14 % (ref 11.5–15.5)
WBC: 15.5 10*3/uL — ABNORMAL HIGH (ref 4.0–10.5)
nRBC: 0 % (ref 0.0–0.2)

## 2020-04-19 LAB — TROPONIN I (HIGH SENSITIVITY)
Troponin I (High Sensitivity): 4 ng/L (ref <18)
Troponin I (High Sensitivity): 4 ng/L (ref <18)

## 2020-04-19 IMAGING — DX DG CHEST 2V
2 series · 2 of 2 positions shown · non-contrast
Comparison: None.

CLINICAL DATA: Chest pain.

EXAM:
CHEST - 2 VIEW

[chest pa]
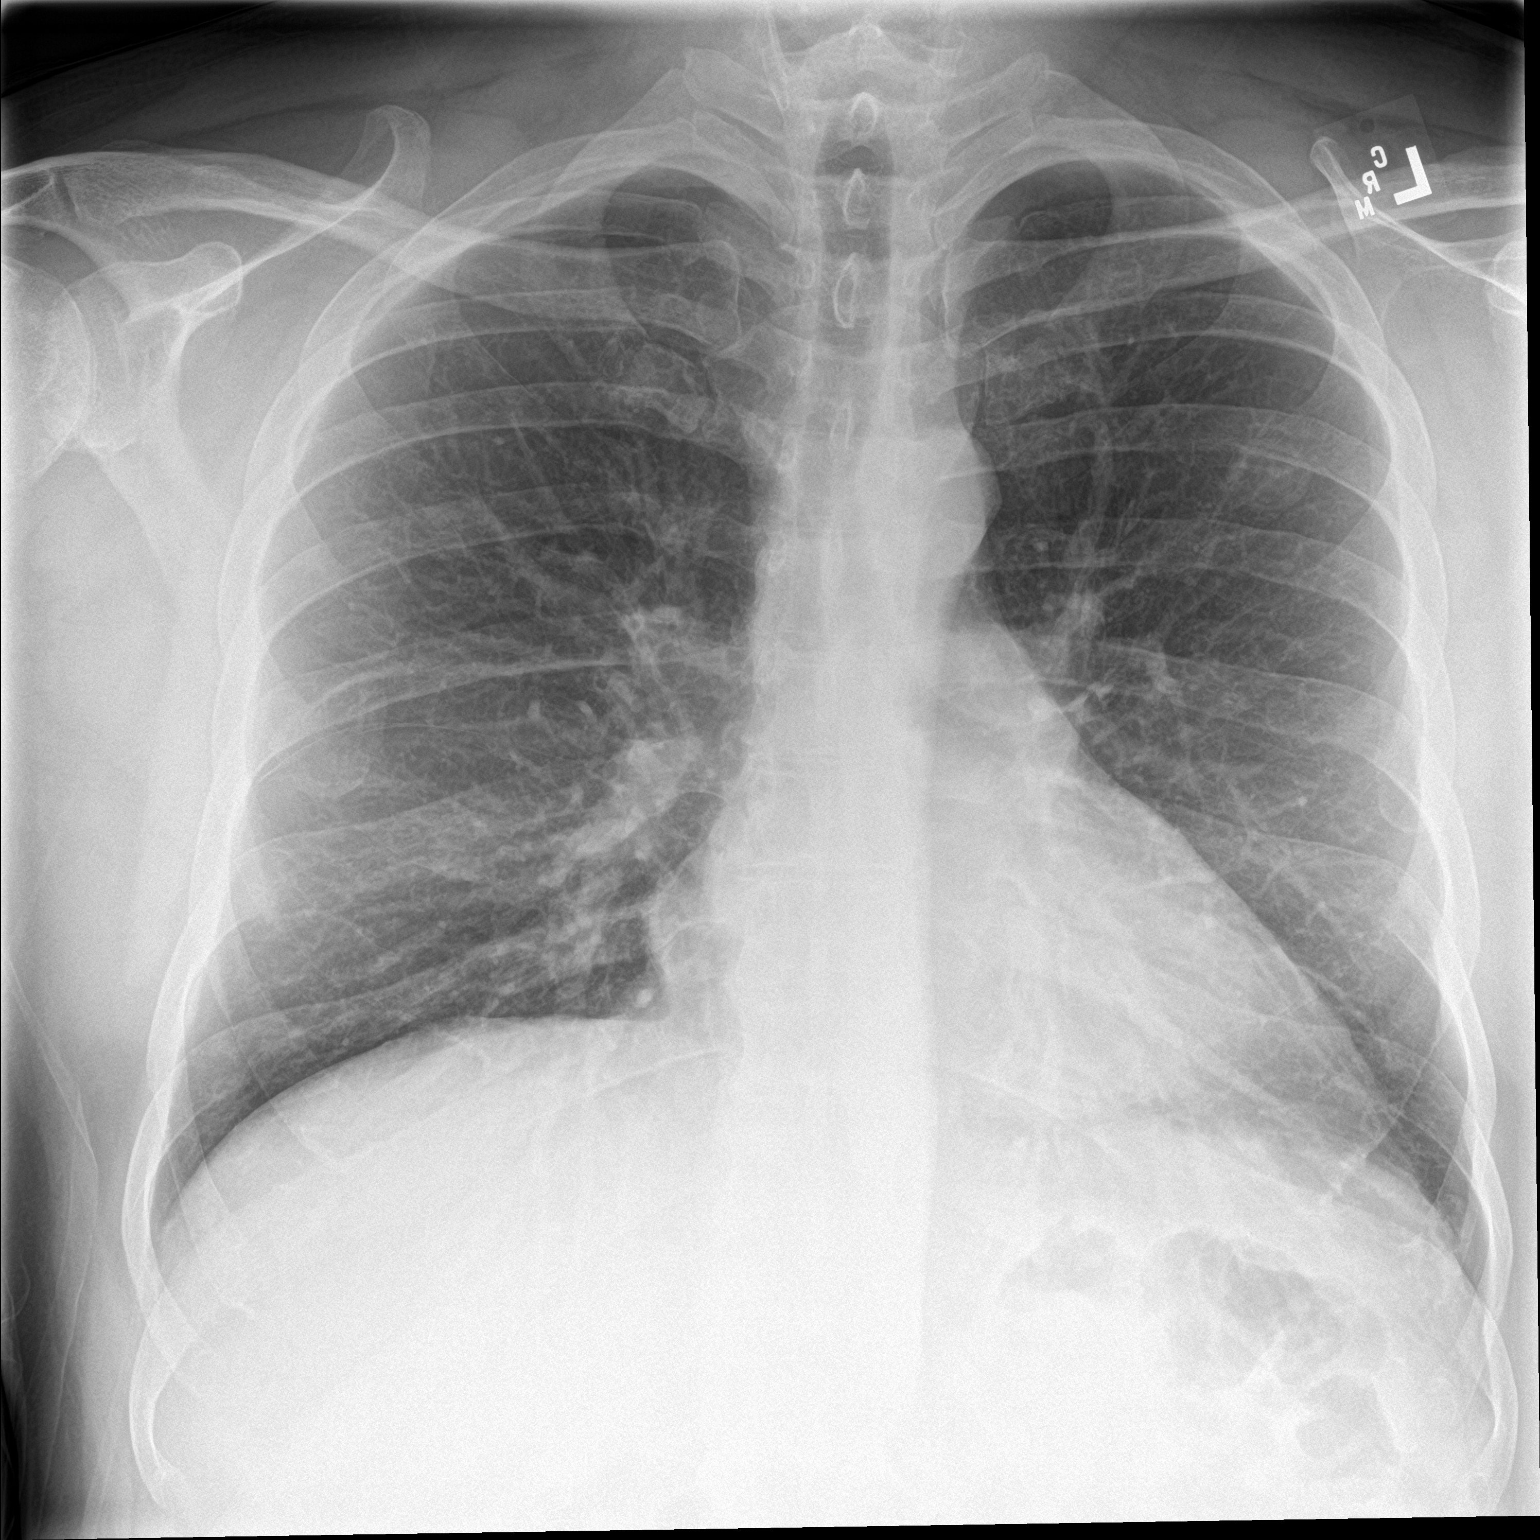

[chest lat]
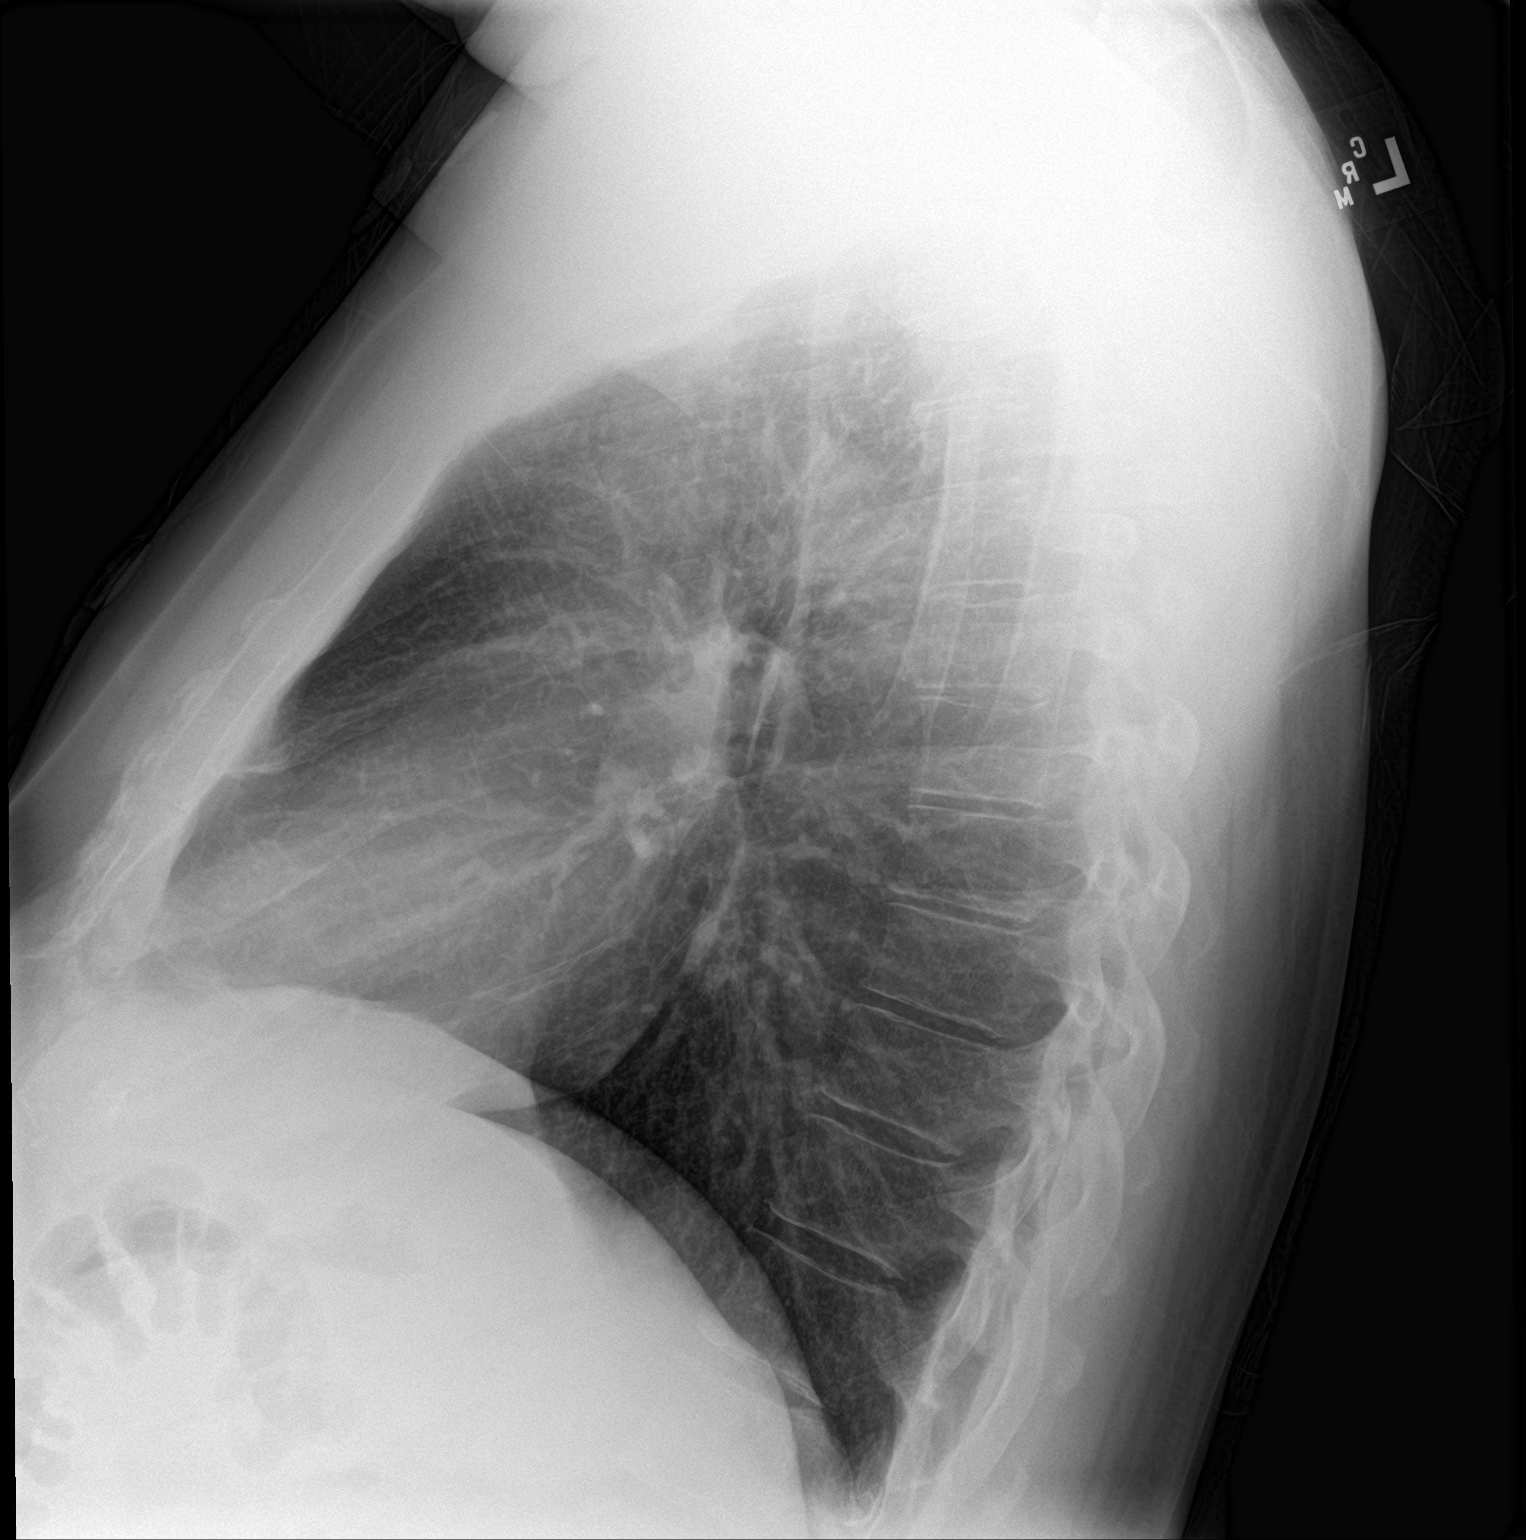

[2 of 2 positions shown; findings below may reference images not displayed]

FINDINGS: The heart size and mediastinal contours are within normal limits.
Right lung is clear. Faint rounded density is seen over left upper
lobe which most likely represents overlying external object. No
pneumothorax or pleural effusion is noted. The visualized skeletal
structures are unremarkable.
IMPRESSION: Faint rounded density is seen over left upper lobe which most likely
represents overlying external object; repeat radiograph after
removal of all overlying objects is recommended to rule out
pulmonary nodule. Otherwise negative.

## 2020-04-19 MED ORDER — HYDROXYZINE HCL 25 MG PO TABS
25.0000 mg | ORAL_TABLET | Freq: Once | ORAL | Status: AC
  Administered 2020-04-19: 25 mg via ORAL
  Filled 2020-04-19: qty 1

## 2020-04-19 MED ORDER — LORAZEPAM 0.5 MG PO TABS
0.5000 mg | ORAL_TABLET | Freq: Once | ORAL | Status: AC
  Administered 2020-04-19: 0.5 mg via ORAL
  Filled 2020-04-19: qty 1

## 2020-04-19 MED ORDER — SODIUM CHLORIDE 0.9 % IV BOLUS
1000.0000 mL | Freq: Once | INTRAVENOUS | Status: AC
  Administered 2020-04-19: 1000 mL via INTRAVENOUS

## 2020-04-19 MED ORDER — HYDROXYZINE HCL 25 MG PO TABS: 25.0000 mg | ORAL_TABLET | Freq: Four times a day (QID) | ORAL | 0 refills | Status: AC

## 2020-04-19 NOTE — Discharge Instructions (Signed)
Your work-up for chest pain was reassuring today.  Please follow-up with your psychiatrist as well as your primary care doctor.  I do suspect that some aspect of your symptoms is contributed to by the withdrawal from Klonopin which she has been on for quite some time and stopped taking last week.  Please use hydroxyzine as needed for anxiety.  Please also discuss the symptoms with your psychiatrist.  Matthew [name] of water, monitor your symptoms.  You may take Tylenol 1000 mg every 6 hours as needed for chest pain.  If you have any new or concerning symptoms please return to the ER for reevaluation.

## 2020-04-19 NOTE — ED Triage Notes (Addendum)
Patient arrives to ED with complaints of fevers, chills, chest tightness, nausea, night sweats x3 days. Pt states the chest tightness radiates to right arm. Denies SOB. Pt states his BS has been in the 500's and he's recently started a new medication for his diabetes. Has a known COVID+ contact.

## 2020-04-19 NOTE — ED Provider Notes (Signed)
[facility] EMERGENCY DEPARTMENT Provider Note   CSN: [id number] Arrival date & time: 04/19/20  1707     History Chief Complaint  Patient presents with  . Chest Pain  . Hyperglycemia    Matthew [name] is a 44 y.o. male.  HPI  History of anxiety, depression, paranoia, schizoaffective disorder on Adderall as well as Cymbalta  Is a 44 year old male presented today with symptoms of chest pain, anxiety, heart palpitations.  He states he also feels tremulous. His symptoms have been ongoing for approximately 1 week.  He is not particularly forthcoming about his presentation and symptoms.  He states that the pain is achy and sternal/right-sided.  He states it seems to radiate into his right arm all the way down to his elbow.  He states it is neither exertional nor pleuritic.  When asked what makes it worse he states "getting anxious "I asked him how he normally deals with this and he states that taking Klonopin has been his usual method.  He informs me that Dr. Marlyne Beards his psychiatrist took him off Klonopin and approximately 1 week ago.  He was given a taper but he states that he still ended up going off it rather abruptly.  Patient denies any alcohol use.  He states he has had some night sweats and chills but denies any fevers.  He denies any shortness of breath.  He states that he has recently been exposed to Covid he is not vaccinated he denies any cough, myalgias, congestion or fevers--although he has been having night sweats.  No other associated symptoms.  No other aggravating factors.  He has taken no medications prior to arrival for his symptoms.     Past Medical History:  Diagnosis Date  . Anxiety   . Celiac disease   . Dairy allergy   . Depression   . High cholesterol   . Hypertension   . Paranoia (HCC)   . Pre-diabetes   . Schizoaffective disorder [facility])     Patient Active Problem List   Diagnosis Date Noted  . Attention deficit hyperactivity  disorder (ADHD), combined type, moderate 03/10/2018  . Generalized anxiety disorder 03/10/2018  . Schizoaffective disorder, bipolar type without good prognostic features (HCC) 02/09/2015  . Morbid obesity (HCC) 01/18/2015  . OSA (obstructive sleep apnea) 07/14/2014  . Solitary pulmonary nodule 07/14/2014    Past Surgical History:  Procedure Laterality Date  . APPENDECTOMY    . BRAIN SURGERY    . NASAL SEPTUM SURGERY    . WRIST FRACTURE SURGERY     right wrist       Family History  Problem Relation Age of Onset  . Asthma Mother   . Hypertension Mother   . Hypertension Father     Social History   Tobacco Use  . Smoking status: Current Every Day Smoker    Packs/day: 1.00    Types: Cigarettes  . Smokeless tobacco: Never Used  Vaping Use  . Vaping Use: Never used  Substance Use Topics  . Alcohol use: No    Alcohol/week: 0.0 standard drinks    Comment: no (02/08/15)  . Drug use: No    Home Medications Prior to Admission medications   Medication Sig Start Date End Date Taking? Authorizing Provider  amphetamine-dextroamphetamine (ADDERALL) 30 MG tablet Take 1 tablet by mouth 2 (two) times daily. 04/20/20 05/20/20 Yes Chauncey Mann, MD  atenolol (TENORMIN) 100 MG tablet TAKE 1 TABLET (100 MG TOTAL) BY MOUTH DAILY AFTER BREAKFAST. Patient  taking differently: Take 100 mg by mouth every evening.  03/29/20  Yes Chauncey Mann, MD  atorvastatin (LIPITOR) 20 MG tablet Take 20 mg by mouth daily. 03/28/20  Yes [provider]  DULoxetine (CYMBALTA) 60 MG capsule TAKE 1 CAPSULE BY MOUTH 2 TIMES DAILY. Patient taking differently: Take 60 mg by mouth 2 (two) times daily.  03/29/20  Yes Chauncey Mann, MD  gabapentin (NEURONTIN) 800 MG tablet TAKE 1 TABLET BY MOUTH THREE TIMES A DAY 03/29/20  Yes Chauncey Mann, MD  lisinopril (ZESTRIL) 20 MG tablet Take 20 mg by mouth daily. 03/28/20  Yes [provider]  lithium carbonate (LITHOBID) 300 MG CR tablet TAKE 2  TABLETS (600 MG TOTAL) BY MOUTH AT BEDTIME. 03/29/20  Yes Chauncey Mann, MD  perphenazine (TRILAFON) 16 MG tablet Take 1 pill total 16 mg every morning and take 2 pills by mouth total 32 mg every bedtime 03/29/20  Yes Chauncey Mann, MD  traZODone (DESYREL) 100 MG tablet TAKE 3 TABLETS BY MOUTH EVERY DAY AT BEDTIME AS NEEDED FOR SLEEP 03/29/20  Yes Chauncey Mann, MD  VICTOZA 18 MG/3ML SOPN Inject 0.6 mg into the skin daily.  03/07/20  Yes [provider]  XIGDUO XR 02-999 MG TB24 Take 1 tablet by mouth daily. 03/05/20  Yes [provider]  amphetamine-dextroamphetamine (ADDERALL) 30 MG tablet Take 1 tablet by mouth 2 (two) times daily. 05/20/20 06/19/20  Chauncey Mann, MD  amphetamine-dextroamphetamine (ADDERALL) 30 MG tablet Take 1 tablet by mouth 2 (two) times daily. 06/19/20 07/19/20  Chauncey Mann, MD  clonazePAM (KLONOPIN) 1 MG tablet I po tid X 4 days, then 1 po bid X 4 days, then 1 po daily for 4 days, then 1/2 po daily for 4 days then stop Patient not taking: Reported on 04/19/2020 04/06/20   Chauncey Mann, MD  clonazePAM (KLONOPIN) 2 MG tablet Take 1 tablet (2 mg total) by mouth 2 (two) times daily as needed for anxiety. Patient not taking: Reported on 04/19/2020 03/29/20   Chauncey Mann, MD  fluticasone [facility]) 50 MCG/ACT nasal spray Place 1 spray into both nostrils as needed for allergies or rhinitis. Patient not taking: Reported on 10/21/2018 02/11/15   Armandina Stammer I, NP  hydrOXYzine (ATARAX/VISTARIL) 25 MG tablet Take 1 tablet (25 mg total) by mouth every 6 (six) hours for 14 days. 04/19/20 05/03/20  Gailen Shelter, PA  nicotine polacrilex (NICORETTE) 2 MG gum Take 1 each (2 mg total) by mouth as needed for smoking cessation. Patient not taking: Reported on 10/21/2018 02/11/15   Armandina Stammer I, NP    Allergies    Gluten meal  Review of Systems   Review of Systems  Constitutional: Negative for chills and fever.  HENT: Negative for congestion.    Eyes: Negative for pain.  Respiratory: Negative for cough and shortness of breath.   Cardiovascular: Positive for chest pain and palpitations. Negative for leg swelling.  Gastrointestinal: Negative for abdominal pain, diarrhea, nausea and vomiting.  Musculoskeletal: Negative for myalgias.  Skin: Negative for rash.  Neurological: Negative for headaches.  Psychiatric/Behavioral: Negative for self-injury and suicidal ideas. The patient is nervous/anxious.     Physical Exam Updated Vital Signs BP (!) 145/96   Pulse 90   Temp 98.4 F (36.9 C) (Oral)   Resp 12   Ht 6\' 2"  (1.88 m)   Wt 124.7 kg   SpO2 95%   BMI 35.31 kg/m   Physical Exam Vitals  and nursing note reviewed.  Constitutional:      General: He is not in acute distress.    Appearance: He is obese.     Comments: Mild diaphoresis   HENT:     Head: Normocephalic and atraumatic.     Nose: Nose normal.  Eyes:     General: No scleral icterus. Cardiovascular:     Rate and Rhythm: Normal rate and regular rhythm.     Pulses: Normal pulses.     Heart sounds: Normal heart sounds.     Comments: HR 92 Bilateral 3+ radial pulses Pulmonary:     Effort: Pulmonary effort is normal. No respiratory distress.     Breath sounds: Normal breath sounds. No wheezing.  Abdominal:     Palpations: Abdomen is soft.     Tenderness: There is no abdominal tenderness. There is no guarding or rebound.  Musculoskeletal:     Cervical back: Normal range of motion.     Right lower leg: No edema.     Left lower leg: No edema.  Skin:    General: Skin is warm and dry.     Capillary Refill: Capillary refill takes less than 2 seconds.  Neurological:     Mental Status: He is alert. Mental status is at baseline.  Psychiatric:     Comments: Anxious appearing     ED Results / Procedures / Treatments   Labs (all labs ordered are listed, but only abnormal results are displayed) Labs Reviewed  BASIC METABOLIC PANEL - Abnormal; Notable for the  following components:      Result Value   Sodium 133 (*)    CO2 19 (*)    Glucose, Bld 197 (*)    All other components within normal limits  CBC - Abnormal; Notable for the following components:   WBC 15.5 (*)    RBC 6.00 (*)    All other components within normal limits  RESP PANEL BY RT-PCR (FLU A&B, COVID) ARPGX2  TROPONIN I (HIGH SENSITIVITY)  TROPONIN I (HIGH SENSITIVITY)    EKG EKG Interpretation  Date/Time:  Tuesday April 19 2020 17:15:19 EST Ventricular Rate:  114 PR Interval:  204 QRS Duration: 98 QT Interval:  314 QTC Calculation: 432 R Axis:   162 Text Interpretation: Sinus tachycardia with occasional Premature ventricular complexes Left posterior fascicular block Inferior infarct , age undetermined Anterior infarct , age undetermined Abnormal ECG No significant change since last tracing Confirmed by Richardean Canal ([id number]) on 04/19/2020 9:22:39 PM   Radiology DG Chest 2 View  Result Date: 04/19/2020 CLINICAL DATA:  Chest pain. EXAM: CHEST - 2 VIEW COMPARISON:  None. FINDINGS: The heart size and mediastinal contours are within normal limits. Right lung is clear. Faint rounded density is seen over left upper lobe which most likely represents overlying external object. No pneumothorax or pleural effusion is noted. The visualized skeletal structures are unremarkable. IMPRESSION: Faint rounded density is seen over left upper lobe which most likely represents overlying external object; repeat radiograph after removal of all overlying objects is recommended to rule out pulmonary nodule. Otherwise negative. Electronically Signed   By: Lupita Raider M.D.   On: 04/19/2020 18:22    Procedures Procedures (including critical care time)  Medications Ordered in ED Medications  LORazepam (ATIVAN) tablet 0.5 mg (0.5 mg Oral Given 04/19/20 2222)  sodium chloride 0.9 % bolus 1,000 mL (0 mLs Intravenous Stopped 04/19/20 2250)  hydrOXYzine (ATARAX/VISTARIL) tablet 25 mg (25 mg Oral  Given 04/19/20 2222)  ED Course  I have reviewed the triage vital signs and the nursing notes.  Pertinent labs & imaging results that were available during my care of the patient were reviewed by me and considered in my medical decision making (see chart for details).    MDM Rules/Calculators/A&P                          Patient is presenting today with atypical chest pain he is 44 year old male he is a smoker.  Has no significant risk factors for pulmonary embolism. His symptoms consist primarily of anxiety, chest tightness, night sweats, chills, chest tightness.  His symptoms began 1 week ago-although he originally told triage nurse 3 days-the occurrence that seemed to have prompted his symptoms per patient is coming off Klonopin.  Physical exam is without any focal abnormalities he is somewhat anxious appearing obese male who is not tachycardic has mild hypertension but no other significant abnormalities on my exam.  Given his history physical exam and past medical history most likely diagnosis is considered.  Low suspicion for ACS. PERC decision-making tool applied given my physical examination which is without tachycardia or low SPO2.  He is PERC negative.   CBC 15.5 WBC and evidence of hemoconcentration w RBC 6. Pt appears dehydrated.  BMP w/ mild hyperglycemia -- given fluids Troponin x2 WNLs (no delta @4 ) Covid obtained and pending. CXR WNLs NAD EKG reviewed by my attending physician.  No other acute abnormalities.   Patient is anxious appearing and is withdrawing from benzos.  Given small dose of Ativan and fluids and hydroxyzine.  Significant improvement.  Will discharge with follow-up with his primary care doctor and psychiatrist.  Patient's tachycardia resolved with fluids--of which he only received approximately 150 mL--and small dose of Ativan and hydroxyzine.  Will send home with small dose of hydroxyzine and FU with psychiatrist.   Patient states he feels better  after initial intervention and would like to leave.  DC at this time.   Patient is no longer tremulous or diaphoretic.  He is no longer tachycardic.  Patient has already been through likely the worst of his withdrawals from benzodiazepines.  Will provide with hydroxyzine and discharge.    I discussed the case with my attending physician Dr. Silverio LayYao who agrees with my plan  Patient is agreeable to plan.  He is eager for discharge. He understands return precautions.  Final Clinical Impression(s) / ED Diagnoses Final diagnoses:  Benzodiazepine withdrawal without complication (HCC)  Anxiety  Chest pain, unspecified type    Rx / DC Orders ED Discharge Orders         Ordered    hydrOXYzine (ATARAX/VISTARIL) 25 MG tablet  Every 6 hours        04/19/20 2246           Solon AugustaFondaw, Mylon Mabey [place], [place] 04/20/20 0944    Charlynne PanderYao, David Hsienta, MD 04/20/20 1504

## 2020-06-06 ENCOUNTER — Emergency Department (HOSPITAL_COMMUNITY): Payer: Medicaid Other

## 2020-06-06 ENCOUNTER — Ambulatory Visit (HOSPITAL_COMMUNITY)
Admission: EM | Discharge status: Against Medical Advice | Payer: Self-pay | Source: Home / Self Care | Attending: Emergency Medicine

## 2020-06-06 ENCOUNTER — Observation Stay (HOSPITAL_COMMUNITY)
Admission: EM | Admit: 2020-06-06 | Discharge: 2020-06-07 | DRG: 246 | Discharge status: Against Medical Advice | Payer: Medicaid Other | Attending: Internal Medicine | Admitting: Internal Medicine

## 2020-06-06 ENCOUNTER — Encounter (HOSPITAL_COMMUNITY): Payer: Self-pay

## 2020-06-06 ENCOUNTER — Other Ambulatory Visit: Payer: Self-pay

## 2020-06-06 DIAGNOSIS — Z5329 Procedure and treatment not carried out because of patient's decision for other reasons: Secondary | ICD-10-CM | POA: Diagnosis present

## 2020-06-06 DIAGNOSIS — Z91128 Patient's intentional underdosing of medication regimen for other reason: Secondary | ICD-10-CM

## 2020-06-06 DIAGNOSIS — R079 Chest pain, unspecified: Secondary | ICD-10-CM

## 2020-06-06 DIAGNOSIS — F419 Anxiety disorder, unspecified: Secondary | ICD-10-CM | POA: Diagnosis present

## 2020-06-06 DIAGNOSIS — D696 Thrombocytopenia, unspecified: Secondary | ICD-10-CM | POA: Diagnosis present

## 2020-06-06 DIAGNOSIS — Z79899 Other long term (current) drug therapy: Secondary | ICD-10-CM | POA: Diagnosis not present

## 2020-06-06 DIAGNOSIS — Z9114 Patient's other noncompliance with medication regimen: Secondary | ICD-10-CM

## 2020-06-06 DIAGNOSIS — F17210 Nicotine dependence, cigarettes, uncomplicated: Secondary | ICD-10-CM | POA: Diagnosis present

## 2020-06-06 DIAGNOSIS — E785 Hyperlipidemia, unspecified: Secondary | ICD-10-CM | POA: Diagnosis present

## 2020-06-06 DIAGNOSIS — I214 Non-ST elevation (NSTEMI) myocardial infarction: Secondary | ICD-10-CM | POA: Diagnosis present

## 2020-06-06 DIAGNOSIS — T383X6A Underdosing of insulin and oral hypoglycemic [antidiabetic] drugs, initial encounter: Secondary | ICD-10-CM | POA: Diagnosis not present

## 2020-06-06 DIAGNOSIS — G4733 Obstructive sleep apnea (adult) (pediatric): Secondary | ICD-10-CM | POA: Diagnosis present

## 2020-06-06 DIAGNOSIS — E1110 Type 2 diabetes mellitus with ketoacidosis without coma: Secondary | ICD-10-CM | POA: Diagnosis not present

## 2020-06-06 DIAGNOSIS — Z20822 Contact with and (suspected) exposure to covid-19: Secondary | ICD-10-CM | POA: Diagnosis not present

## 2020-06-06 DIAGNOSIS — Z6834 Body mass index (BMI) 34.0-34.9, adult: Secondary | ICD-10-CM

## 2020-06-06 DIAGNOSIS — Z955 Presence of coronary angioplasty implant and graft: Secondary | ICD-10-CM

## 2020-06-06 DIAGNOSIS — F902 Attention-deficit hyperactivity disorder, combined type: Secondary | ICD-10-CM | POA: Diagnosis not present

## 2020-06-06 DIAGNOSIS — E669 Obesity, unspecified: Secondary | ICD-10-CM | POA: Diagnosis not present

## 2020-06-06 DIAGNOSIS — E1165 Type 2 diabetes mellitus with hyperglycemia: Secondary | ICD-10-CM | POA: Diagnosis present

## 2020-06-06 DIAGNOSIS — I249 Acute ischemic heart disease, unspecified: Secondary | ICD-10-CM | POA: Diagnosis present

## 2020-06-06 DIAGNOSIS — Z8249 Family history of ischemic heart disease and other diseases of the circulatory system: Secondary | ICD-10-CM | POA: Diagnosis not present

## 2020-06-06 DIAGNOSIS — F250 Schizoaffective disorder, bipolar type: Secondary | ICD-10-CM | POA: Diagnosis not present

## 2020-06-06 DIAGNOSIS — R778 Other specified abnormalities of plasma proteins: Secondary | ICD-10-CM

## 2020-06-06 DIAGNOSIS — I2510 Atherosclerotic heart disease of native coronary artery without angina pectoris: Secondary | ICD-10-CM | POA: Diagnosis present

## 2020-06-06 DIAGNOSIS — K900 Celiac disease: Secondary | ICD-10-CM | POA: Diagnosis not present

## 2020-06-06 DIAGNOSIS — R0602 Shortness of breath: Secondary | ICD-10-CM

## 2020-06-06 DIAGNOSIS — E875 Hyperkalemia: Secondary | ICD-10-CM | POA: Diagnosis not present

## 2020-06-06 DIAGNOSIS — Z560 Unemployment, unspecified: Secondary | ICD-10-CM | POA: Diagnosis not present

## 2020-06-06 DIAGNOSIS — I10 Essential (primary) hypertension: Secondary | ICD-10-CM | POA: Diagnosis not present

## 2020-06-06 HISTORY — PX: CORONARY STENT INTERVENTION: CATH118234

## 2020-06-06 HISTORY — PX: INTRAVASCULAR ULTRASOUND/IVUS: CATH118244

## 2020-06-06 HISTORY — PX: CORONARY ULTRASOUND/IVUS: CATH118244

## 2020-06-06 HISTORY — PX: LEFT HEART CATH AND CORONARY ANGIOGRAPHY: CATH118249

## 2020-06-06 LAB — CBC WITH DIFFERENTIAL/PLATELET
Abs Immature Granulocytes: 0.04 10*3/uL (ref 0.00–0.07)
Basophils Absolute: 0.2 10*3/uL — ABNORMAL HIGH (ref 0.0–0.1)
Basophils Relative: 2 %
Eosinophils Absolute: 0.4 10*3/uL (ref 0.0–0.5)
Eosinophils Relative: 5 %
HCT: 44.9 % (ref 39.0–52.0)
Hemoglobin: 16.5 g/dL (ref 13.0–17.0)
Immature Granulocytes: 1 %
Lymphocytes Relative: 20 %
Lymphs Abs: 1.8 10*3/uL (ref 0.7–4.0)
MCH: 30.4 pg (ref 26.0–34.0)
MCHC: 36.7 g/dL — ABNORMAL HIGH (ref 30.0–36.0)
MCV: 82.7 fL (ref 80.0–100.0)
Monocytes Absolute: 0.5 10*3/uL (ref 0.1–1.0)
Monocytes Relative: 6 %
Neutro Abs: 5.8 10*3/uL (ref 1.7–7.7)
Neutrophils Relative %: 66 %
Platelets: 137 10*3/uL — ABNORMAL LOW (ref 150–400)
RBC: 5.43 MIL/uL (ref 4.22–5.81)
RDW: 13.9 % (ref 11.5–15.5)
WBC: 8.7 10*3/uL (ref 4.0–10.5)
nRBC: 0 % (ref 0.0–0.2)

## 2020-06-06 LAB — BASIC METABOLIC PANEL
Anion gap: 16 — ABNORMAL HIGH (ref 5–15)
Anion gap: 13 (ref 5–15)
BUN: 12 mg/dL (ref 6–20)
BUN: 11 mg/dL (ref 6–20)
CO2: 15 mmol/L — ABNORMAL LOW (ref 22–32)
CO2: 17 mmol/L — ABNORMAL LOW (ref 22–32)
Calcium: 8.8 mg/dL — ABNORMAL LOW (ref 8.9–10.3)
Calcium: 8.8 mg/dL — ABNORMAL LOW (ref 8.9–10.3)
Chloride: 98 mmol/L (ref 98–111)
Chloride: 99 mmol/L (ref 98–111)
Creatinine, Ser: 1.08 mg/dL (ref 0.61–1.24)
Creatinine, Ser: 0.95 mg/dL (ref 0.61–1.24)
GFR, Estimated: >60 mL/min (ref >60)
GFR, Estimated: >60 mL/min (ref >60)
Glucose, Bld: 421 mg/dL — ABNORMAL HIGH (ref 70–99)
Glucose, Bld: 228 mg/dL — ABNORMAL HIGH (ref 70–99)
Potassium: 6.1 mmol/L — ABNORMAL HIGH (ref 3.5–5.1)
Potassium: 4.5 mmol/L (ref 3.5–5.1)
Sodium: 129 mmol/L — ABNORMAL LOW (ref 135–145)
Sodium: 129 mmol/L — ABNORMAL LOW (ref 135–145)

## 2020-06-06 LAB — CBC
HCT: 45.4 % (ref 39.0–52.0)
Hemoglobin: 16.5 g/dL (ref 13.0–17.0)
MCH: 29.4 pg (ref 26.0–34.0)
MCHC: 36.3 g/dL — ABNORMAL HIGH (ref 30.0–36.0)
MCV: 80.8 fL (ref 80.0–100.0)
Platelets: 199 10*3/uL (ref 150–400)
RBC: 5.62 MIL/uL (ref 4.22–5.81)
RDW: 13.9 % (ref 11.5–15.5)
WBC: 12.9 10*3/uL — ABNORMAL HIGH (ref 4.0–10.5)
nRBC: 0 % (ref 0.0–0.2)

## 2020-06-06 LAB — I-STAT ARTERIAL BLOOD GAS, ED
Acid-base deficit: 3 mmol/L — ABNORMAL HIGH (ref 0.0–2.0)
Bicarbonate: 22.6 mmol/L (ref 20.0–28.0)
Calcium, Ion: 1.23 mmol/L (ref 1.15–1.40)
HCT: 45 % (ref 39.0–52.0)
Hemoglobin: 15.3 g/dL (ref 13.0–17.0)
O2 Saturation: 96 %
Potassium: 4.1 mmol/L (ref 3.5–5.1)
Sodium: 133 mmol/L — ABNORMAL LOW (ref 135–145)
TCO2: 24 mmol/L (ref 22–32)
pCO2 arterial: 40.4 mmHg (ref 32.0–48.0)
pH, Arterial: 7.356 (ref 7.350–7.450)
pO2, Arterial: 83 mmHg (ref 83.0–108.0)

## 2020-06-06 LAB — RESP PANEL BY RT-PCR (FLU A&B, COVID) ARPGX2
Influenza A by PCR: NEGATIVE
Influenza B by PCR: NEGATIVE
SARS Coronavirus 2 by RT PCR: NEGATIVE

## 2020-06-06 LAB — BETA-HYDROXYBUTYRIC ACID: Beta-Hydroxybutyric Acid: 1.28 mmol/L — ABNORMAL HIGH (ref 0.05–0.27)

## 2020-06-06 LAB — POCT ACTIVATED CLOTTING TIME
Activated Clotting Time: 404 seconds
Activated Clotting Time: 244 seconds

## 2020-06-06 LAB — LIPID PANEL
Cholesterol: 717 mg/dL — ABNORMAL HIGH (ref 0–200)
LDL Cholesterol: UNDETERMINED mg/dL (ref 0–99)
Triglycerides: 2311 mg/dL — ABNORMAL HIGH (ref <150)
VLDL: UNDETERMINED mg/dL (ref 0–40)

## 2020-06-06 LAB — TROPONIN I (HIGH SENSITIVITY)
Troponin I (High Sensitivity): 59 ng/L — ABNORMAL HIGH (ref <18)
Troponin I (High Sensitivity): 224 ng/L (ref <18)

## 2020-06-06 LAB — HIV ANTIBODY (ROUTINE TESTING W REFLEX): HIV Screen 4th Generation wRfx: NONREACTIVE

## 2020-06-06 LAB — HEMOGLOBIN A1C
Hgb A1c MFr Bld: 12.8 % — ABNORMAL HIGH (ref 4.8–5.6)
Mean Plasma Glucose: 320.66 mg/dL

## 2020-06-06 LAB — LITHIUM LEVEL: Lithium Lvl: 0.22 mmol/L — ABNORMAL LOW (ref 0.60–1.20)

## 2020-06-06 LAB — TSH: TSH: 2.644 u[IU]/mL (ref 0.350–4.500)

## 2020-06-06 LAB — GLUCOSE, CAPILLARY: Glucose-Capillary: 228 mg/dL — ABNORMAL HIGH (ref 70–99)

## 2020-06-06 LAB — D-DIMER, QUANTITATIVE: D-Dimer, Quant: 0.49 ug/mL-FEU (ref 0.00–0.50)

## 2020-06-06 LAB — LDL CHOLESTEROL, DIRECT: Direct LDL: 51 mg/dL (ref 0–99)

## 2020-06-06 LAB — POTASSIUM: Potassium: 7.1 mmol/L (ref 3.5–5.1)

## 2020-06-06 IMAGING — DX DG CHEST 1V PORT
1 series · 1 of 1 positions shown · non-contrast
Comparison: Portable exam [TD] hours compared to [DATE]

CLINICAL DATA: Chest pain and shortness of breath since [TD] hours

EXAM:
PORTABLE CHEST 1 VIEW

[chest ap]
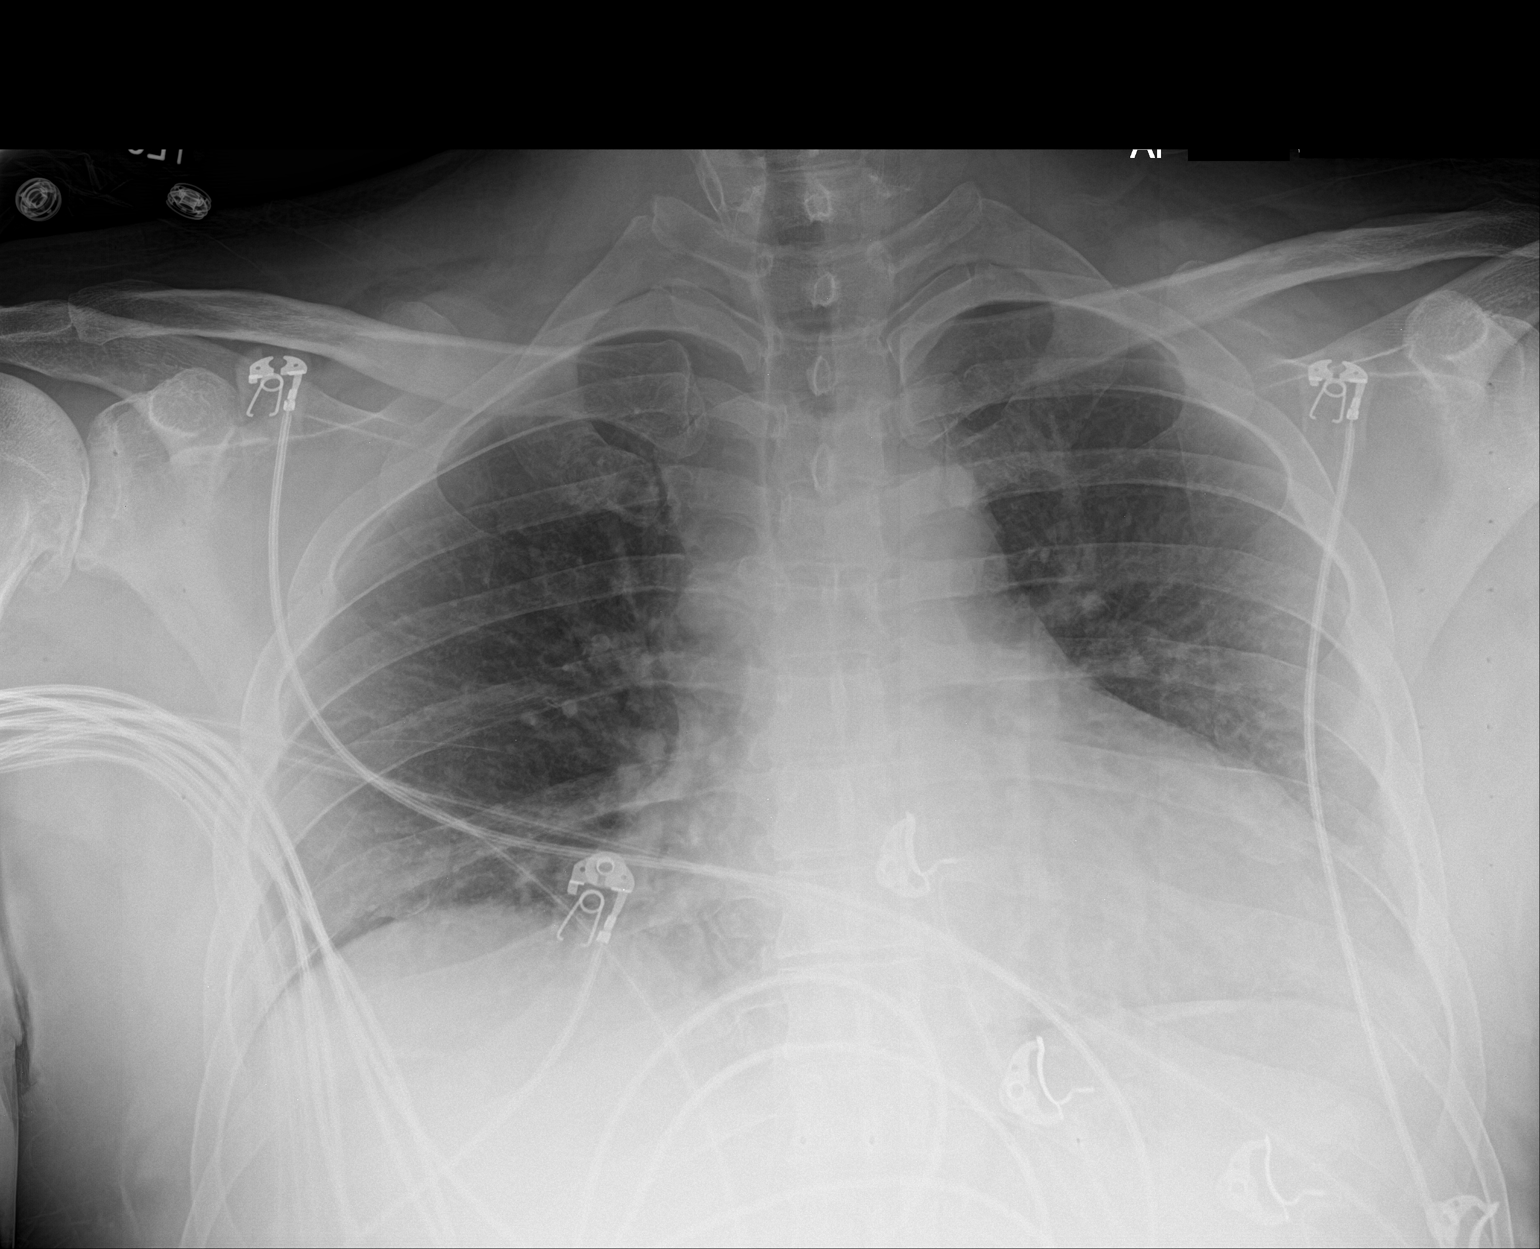

[1 of 1 positions shown; findings below may reference images not displayed]

FINDINGS: Decreased lung volumes versus prior study with accentuation of heart
size.

Mediastinal contours and pulmonary vascularity normal.

Mild bibasilar atelectasis.

Upper lungs clear.

No infiltrate, pleural effusion or pneumothorax.

Osseous structures unremarkable.
IMPRESSION: Decreased lung volumes with bibasilar atelectasis.

## 2020-06-06 SURGERY — LEFT HEART CATH AND CORONARY ANGIOGRAPHY
Anesthesia: LOCAL

## 2020-06-06 MED ORDER — INSULIN ASPART 100 UNIT/ML ~~LOC~~ SOLN
7.0000 [IU] | Freq: Once | SUBCUTANEOUS | Status: AC
  Administered 2020-06-06: 7 [IU] via INTRAVENOUS

## 2020-06-06 MED ORDER — HEPARIN SODIUM (PORCINE) 5000 UNIT/ML IJ SOLN: 4000.0000 [IU] | Freq: Once | INTRAMUSCULAR | Status: DC

## 2020-06-06 MED ORDER — NITROGLYCERIN 1 MG/10 ML FOR IR/CATH LAB
INTRA_ARTERIAL | Status: AC
  Filled 2020-06-06: qty 10

## 2020-06-06 MED ORDER — HYDRALAZINE HCL 20 MG/ML IJ SOLN: 10.0000 mg | INTRAMUSCULAR | Status: AC | PRN

## 2020-06-06 MED ORDER — DULOXETINE HCL 60 MG PO CPEP
60.0000 mg | ORAL_CAPSULE | Freq: Two times a day (BID) | ORAL | Status: DC
  Administered 2020-06-06: 60 mg via ORAL
  Filled 2020-06-06: qty 1

## 2020-06-06 MED ORDER — NITROGLYCERIN 1 MG/10 ML FOR IR/CATH LAB
INTRA_ARTERIAL | Status: DC | PRN
  Administered 2020-06-06 (×2): 200 ug via INTRACORONARY

## 2020-06-06 MED ORDER — LABETALOL HCL 5 MG/ML IV SOLN: 10.0000 mg | INTRAVENOUS | Status: AC | PRN

## 2020-06-06 MED ORDER — NICOTINE 21 MG/24HR TD PT24
21.0000 mg | MEDICATED_PATCH | Freq: Every day | TRANSDERMAL | Status: DC
  Administered 2020-06-06: 21 mg via TRANSDERMAL
  Filled 2020-06-06: qty 1

## 2020-06-06 MED ORDER — SODIUM CHLORIDE 0.9 % IV BOLUS
1000.0000 mL | Freq: Once | INTRAVENOUS | Status: AC
  Administered 2020-06-06: 1000 mL via INTRAVENOUS

## 2020-06-06 MED ORDER — HEPARIN (PORCINE) IN NACL 1000-0.9 UT/500ML-% IV SOLN
INTRAVENOUS | Status: DC | PRN
  Administered 2020-06-06: 500 mL

## 2020-06-06 MED ORDER — SODIUM CHLORIDE 0.9 % IV SOLN
INTRAVENOUS | Status: AC | PRN
  Administered 2020-06-06: 100 mL/h via INTRAVENOUS

## 2020-06-06 MED ORDER — ACETAMINOPHEN 325 MG PO TABS: 650.0000 mg | ORAL_TABLET | ORAL | Status: DC | PRN

## 2020-06-06 MED ORDER — ASPIRIN 81 MG PO CHEW: 81.0000 mg | CHEWABLE_TABLET | Freq: Every day | ORAL | Status: DC

## 2020-06-06 MED ORDER — SODIUM CHLORIDE 0.9% FLUSH: 3.0000 mL | INTRAVENOUS | Status: DC | PRN

## 2020-06-06 MED ORDER — IOHEXOL 350 MG/ML SOLN
INTRAVENOUS | Status: DC | PRN
  Administered 2020-06-06: 180 mL

## 2020-06-06 MED ORDER — HEPARIN BOLUS VIA INFUSION
4000.0000 [IU] | Freq: Once | INTRAVENOUS | Status: DC
  Filled 2020-06-06: qty 4000

## 2020-06-06 MED ORDER — VERAPAMIL HCL 2.5 MG/ML IV SOLN
INTRAVENOUS | Status: DC | PRN
  Administered 2020-06-06: 10 mL via INTRA_ARTERIAL

## 2020-06-06 MED ORDER — INSULIN ASPART 100 UNIT/ML ~~LOC~~ SOLN
0.0000 [IU] | Freq: Every day | SUBCUTANEOUS | Status: DC
  Administered 2020-06-06: 2 [IU] via SUBCUTANEOUS

## 2020-06-06 MED ORDER — HEPARIN (PORCINE) IN NACL 1000-0.9 UT/500ML-% IV SOLN
INTRAVENOUS | Status: AC
  Filled 2020-06-06: qty 1000

## 2020-06-06 MED ORDER — VERAPAMIL HCL 2.5 MG/ML IV SOLN
INTRAVENOUS | Status: AC
  Filled 2020-06-06: qty 2

## 2020-06-06 MED ORDER — SODIUM CHLORIDE 0.9% FLUSH
3.0000 mL | Freq: Two times a day (BID) | INTRAVENOUS | Status: DC
  Administered 2020-06-06: 3 mL via INTRAVENOUS

## 2020-06-06 MED ORDER — ENOXAPARIN SODIUM 40 MG/0.4ML ~~LOC~~ SOLN: 40.0000 mg | SUBCUTANEOUS | Status: DC

## 2020-06-06 MED ORDER — LIDOCAINE HCL (PF) 1 % IJ SOLN
INTRAMUSCULAR | Status: AC
  Filled 2020-06-06: qty 30

## 2020-06-06 MED ORDER — ROSUVASTATIN CALCIUM 5 MG PO TABS: 20.0000 mg | ORAL_TABLET | Freq: Every day | ORAL | Status: DC

## 2020-06-06 MED ORDER — MIDAZOLAM HCL 2 MG/2ML IJ SOLN
INTRAMUSCULAR | Status: DC | PRN
  Administered 2020-06-06: 1 mg via INTRAVENOUS
  Administered 2020-06-06: 2 mg via INTRAVENOUS
  Administered 2020-06-06: 1 mg via INTRAVENOUS

## 2020-06-06 MED ORDER — MIDAZOLAM HCL 2 MG/2ML IJ SOLN
INTRAMUSCULAR | Status: AC
  Filled 2020-06-06: qty 2

## 2020-06-06 MED ORDER — ROSUVASTATIN CALCIUM 20 MG PO TABS: 40.0000 mg | ORAL_TABLET | Freq: Every day | ORAL | Status: DC

## 2020-06-06 MED ORDER — HEPARIN SODIUM (PORCINE) 1000 UNIT/ML IJ SOLN
INTRAMUSCULAR | Status: DC | PRN
  Administered 2020-06-06 (×2): 6000 [IU] via INTRAVENOUS

## 2020-06-06 MED ORDER — ONDANSETRON HCL 4 MG/2ML IJ SOLN: 4.0000 mg | Freq: Four times a day (QID) | INTRAMUSCULAR | Status: DC | PRN

## 2020-06-06 MED ORDER — INSULIN ASPART 100 UNIT/ML ~~LOC~~ SOLN: 0.0000 [IU] | Freq: Three times a day (TID) | SUBCUTANEOUS | Status: DC

## 2020-06-06 MED ORDER — HEPARIN SODIUM (PORCINE) 1000 UNIT/ML IJ SOLN
INTRAMUSCULAR | Status: AC
  Filled 2020-06-06: qty 1

## 2020-06-06 MED ORDER — FENTANYL CITRATE (PF) 100 MCG/2ML IJ SOLN
INTRAMUSCULAR | Status: DC | PRN
  Administered 2020-06-06 (×3): 25 ug via INTRAVENOUS

## 2020-06-06 MED ORDER — LOSARTAN POTASSIUM 25 MG PO TABS: 25.0000 mg | ORAL_TABLET | Freq: Every day | ORAL | Status: DC

## 2020-06-06 MED ORDER — TICAGRELOR 90 MG PO TABS
90.0000 mg | ORAL_TABLET | Freq: Two times a day (BID) | ORAL | Status: DC
  Administered 2020-06-06: 90 mg via ORAL
  Filled 2020-06-06: qty 1

## 2020-06-06 MED ORDER — HEPARIN (PORCINE) 25000 UT/250ML-% IV SOLN: 16.0000 [IU]/kg/h | INTRAVENOUS | Status: DC

## 2020-06-06 MED ORDER — LIDOCAINE HCL (PF) 1 % IJ SOLN
INTRAMUSCULAR | Status: DC | PRN
  Administered 2020-06-06: 2 mL

## 2020-06-06 MED ORDER — TICAGRELOR 90 MG PO TABS
ORAL_TABLET | ORAL | Status: AC
  Filled 2020-06-06: qty 1

## 2020-06-06 MED ORDER — TICAGRELOR 90 MG PO TABS
ORAL_TABLET | ORAL | Status: DC | PRN
  Administered 2020-06-06: 180 mg via ORAL

## 2020-06-06 MED ORDER — SODIUM CHLORIDE 0.9 % IV SOLN: 250.0000 mL | INTRAVENOUS | Status: DC | PRN

## 2020-06-06 MED ORDER — CARVEDILOL 6.25 MG PO TABS: 6.2500 mg | ORAL_TABLET | Freq: Two times a day (BID) | ORAL | Status: DC

## 2020-06-06 MED ORDER — CLONAZEPAM 0.5 MG PO TABS
2.0000 mg | ORAL_TABLET | Freq: Two times a day (BID) | ORAL | Status: DC | PRN
  Administered 2020-06-06: 2 mg via ORAL
  Filled 2020-06-06: qty 4

## 2020-06-06 MED ORDER — FENTANYL CITRATE (PF) 100 MCG/2ML IJ SOLN
INTRAMUSCULAR | Status: AC
  Filled 2020-06-06: qty 2

## 2020-06-06 MED ORDER — HEPARIN (PORCINE) 25000 UT/250ML-% IV SOLN
1300.0000 [IU]/h | INTRAVENOUS | Status: DC
  Filled 2020-06-06: qty 250

## 2020-06-06 SURGICAL SUPPLY — 23 items
BAG SNAP BAND KOVER 36X36 (MISCELLANEOUS) ×2 IMPLANT
BALLN SAPPHIRE 2.5X15 (BALLOONS) ×2
BALLN SAPPHIRE ~~LOC~~ 3.75X10 (BALLOONS) ×2 IMPLANT
BALLN SAPPHIRE ~~LOC~~ 4.0X15 (BALLOONS) ×2 IMPLANT
BALLOON SAPPHIRE 2.5X15 (BALLOONS) ×1 IMPLANT
CATH 5FR JL3.5 JR4 ANG PIG MP (CATHETERS) ×2 IMPLANT
CATH OPTICROSS HD (CATHETERS) ×2 IMPLANT
CATH VISTA GUIDE 6FR XBLAD3.5 (CATHETERS) ×2 IMPLANT
CATH VISTA GUIDE 6FR XBRCA (CATHETERS) ×2 IMPLANT
COVER DOME SNAP 22 D (MISCELLANEOUS) ×2 IMPLANT
DEVICE RAD COMP TR BAND LRG (VASCULAR PRODUCTS) ×2 IMPLANT
GLIDESHEATH SLEND SS 6F .021 (SHEATH) ×2 IMPLANT
GUIDEWIRE INQWIRE 1.5J.035X260 (WIRE) ×2 IMPLANT
INQWIRE 1.5J .035X260CM (WIRE) ×4
KIT ENCORE 26 ADVANTAGE (KITS) ×2 IMPLANT
KIT HEART LEFT (KITS) ×2 IMPLANT
PACK CARDIAC CATHETERIZATION (CUSTOM PROCEDURE TRAY) ×2 IMPLANT
SLED PULL BACK IVUS (MISCELLANEOUS) ×2 IMPLANT
STENT RESOLUTE ONYX 3.5X15 (Permanent Stent) ×2 IMPLANT
STENT RESOLUTE ONYX 3.5X18 (Permanent Stent) ×2 IMPLANT
TRANSDUCER W/STOPCOCK (MISCELLANEOUS) ×2 IMPLANT
TUBING CIL FLEX 10 FLL-RA (TUBING) ×2 IMPLANT
WIRE ASAHI PROWATER 180CM (WIRE) ×4 IMPLANT

## 2020-06-06 NOTE — ED Provider Notes (Signed)
[facility] EMERGENCY DEPARTMENT Provider Note   CSN: [id number] Arrival date & time: 06/06/20  8889     History Chief Complaint  Patient presents with  . Chest Pain    Matthew [name] is a 45 y.o. male.  Patient presents with concern for mid chest pain described as a pressure-like sensation across his chest.  Symptoms started this morning when he woke up from bed.  Has been persistent since.  Denies fevers or cough.  No vomiting or diarrhea.  Patient states that he is diabetic but has not been on medicines for several months.  Last blood sugar check was about 2 weeks ago per patient and he states it was normal at the time.  Denies any cardiac history in the past.        Past Medical History:  Diagnosis Date  . Anxiety   . Celiac disease   . Dairy allergy   . Depression   . High cholesterol   . Hypertension   . Paranoia (HCC)   . Pre-diabetes   . Schizoaffective disorder [facility])     Patient Active Problem List   Diagnosis Date Noted  . Attention deficit hyperactivity disorder (ADHD), combined type, moderate 03/10/2018  . Generalized anxiety disorder 03/10/2018  . Schizoaffective disorder, bipolar type without good prognostic features (HCC) 02/09/2015  . Morbid obesity (HCC) 01/18/2015  . OSA (obstructive sleep apnea) 07/14/2014  . Solitary pulmonary nodule 07/14/2014    Past Surgical History:  Procedure Laterality Date  . APPENDECTOMY    . BRAIN SURGERY    . NASAL SEPTUM SURGERY    . WRIST FRACTURE SURGERY     right wrist       Family History  Problem Relation Age of Onset  . Asthma Mother   . Hypertension Mother   . Hypertension Father     Social History   Tobacco Use  . Smoking status: Current Every Day Smoker    Packs/day: 1.00    Types: Cigarettes  . Smokeless tobacco: Never Used  Vaping Use  . Vaping Use: Never used  Substance Use Topics  . Alcohol use: No    Alcohol/week: 0.0 standard drinks    Comment: no  (02/08/15)  . Drug use: No    Home Medications Prior to Admission medications   Medication Sig Start Date End Date Taking? Authorizing Provider  amphetamine-dextroamphetamine (ADDERALL) 30 MG tablet Take 1 tablet by mouth 2 (two) times daily. 05/20/20 06/19/20 Yes Chauncey Mann, MD  atenolol (TENORMIN) 100 MG tablet TAKE 1 TABLET (100 MG TOTAL) BY MOUTH DAILY AFTER BREAKFAST. Patient taking differently: Take 100 mg by mouth every evening. 03/29/20  Yes Chauncey Mann, MD  clonazePAM (KLONOPIN) 2 MG tablet Take 1 tablet (2 mg total) by mouth 2 (two) times daily as needed for anxiety. 03/29/20  Yes Chauncey Mann, MD  DULoxetine (CYMBALTA) 60 MG capsule TAKE 1 CAPSULE BY MOUTH 2 TIMES DAILY. Patient taking differently: Take 60 mg by mouth 2 (two) times daily. 03/29/20  Yes Chauncey Mann, MD  gabapentin (NEURONTIN) 800 MG tablet TAKE 1 TABLET BY MOUTH THREE TIMES A DAY Patient taking differently: Take 800 mg by mouth 3 (three) times daily. 03/29/20  Yes Chauncey Mann, MD  lithium carbonate (LITHOBID) 300 MG CR tablet TAKE 2 TABLETS (600 MG TOTAL) BY MOUTH AT BEDTIME. 03/29/20  Yes Chauncey Mann, MD  nicotine polacrilex (NICORETTE) 2 MG gum Take 1 each (2 mg total) by mouth as  needed for smoking cessation. 02/11/15  Yes Armandina Stammer I, NP  perphenazine (TRILAFON) 16 MG tablet Take 1 pill total 16 mg every morning and take 2 pills by mouth total 32 mg every bedtime Patient taking differently: Take 16-32 mg by mouth See admin instructions. Take 1 pill total 16 mg every morning and take 2 pills by mouth total 32 mg every bedtime 03/29/20  Yes Chauncey Mann, MD  traZODone (DESYREL) 100 MG tablet TAKE 3 TABLETS BY MOUTH EVERY DAY AT BEDTIME AS NEEDED FOR SLEEP Patient taking differently: Take 300 mg by mouth at bedtime as needed for sleep. TAKE 3 TABLETS BY MOUTH EVERY DAY AT BEDTIME AS NEEDED FOR SLEEP 03/29/20  Yes Chauncey Mann, MD  amphetamine-dextroamphetamine (ADDERALL) 30 MG  tablet Take 1 tablet by mouth 2 (two) times daily. 06/19/20 07/19/20  Chauncey Mann, MD  amphetamine-dextroamphetamine (ADDERALL) 30 MG tablet Take 1 tablet by mouth 2 (two) times daily. 04/20/20 05/20/20  Chauncey Mann, MD  atorvastatin (LIPITOR) 20 MG tablet Take 20 mg by mouth daily. Patient not taking: Reported on 06/06/2020 03/28/20   [provider]  clonazePAM (KLONOPIN) 1 MG tablet I po tid X 4 days, then 1 po bid X 4 days, then 1 po daily for 4 days, then 1/2 po daily for 4 days then stop Patient not taking: No sig reported 04/06/20   Chauncey Mann, MD  fluticasone [facility]) 50 MCG/ACT nasal spray Place 1 spray into both nostrils as needed for allergies or rhinitis. Patient not taking: No sig reported 02/11/15   Armandina Stammer I, NP  lisinopril (ZESTRIL) 20 MG tablet Take 20 mg by mouth daily. Patient not taking: Reported on 06/06/2020 03/28/20   [provider]  VICTOZA 18 MG/3ML SOPN Inject 0.6 mg into the skin daily.  Patient not taking: Reported on 06/06/2020 03/07/20   [provider]  XIGDUO XR 02-999 MG TB24 Take 1 tablet by mouth daily. Patient not taking: Reported on 06/06/2020 03/05/20   [provider]    Allergies    Gluten meal  Review of Systems   Review of Systems  Constitutional: Negative for fever.  HENT: Negative for ear pain and sore throat.   Eyes: Negative for pain.  Respiratory: Negative for cough.   Cardiovascular: Positive for chest pain.  Gastrointestinal: Negative for abdominal pain.  Genitourinary: Negative for flank pain.  Musculoskeletal: Negative for back pain.  Skin: Negative for color change and rash.  Neurological: Negative for syncope.  All other systems reviewed and are negative.   Physical Exam Updated Vital Signs BP (!) 134/103   Pulse 92   Temp 97.7 F (36.5 C) (Oral)   Resp (!) 24   Ht 6\' 2"  (1.88 m)   Wt 122.5 kg   SpO2 94%   BMI 34.67 kg/m   Physical Exam Constitutional:       General: He is not in acute distress.    Appearance: He is well-developed.  HENT:     Head: Normocephalic.     Nose: Nose normal.  Eyes:     Extraocular Movements: Extraocular movements intact.  Cardiovascular:     Rate and Rhythm: Normal rate.  Pulmonary:     Effort: Pulmonary effort is normal.  Skin:    Coloration: Skin is not jaundiced.  Neurological:     Mental Status: He is alert. Mental status is at baseline.     ED Results / Procedures / Treatments   Labs (all labs ordered  are listed, but only abnormal results are displayed) Labs Reviewed  CBC WITH DIFFERENTIAL/PLATELET - Abnormal; Notable for the following components:      Result Value   MCHC 36.7 (*)    Platelets 137 (*)    Basophils Absolute 0.2 (*)    All other components within normal limits  BASIC METABOLIC PANEL - Abnormal; Notable for the following components:   Sodium 129 (*)    Potassium 6.1 (*)    CO2 15 (*)    Glucose, Bld 421 (*)    Calcium 8.8 (*)    Anion gap 16 (*)    All other components within normal limits  BETA-HYDROXYBUTYRIC ACID - Abnormal; Notable for the following components:   Beta-Hydroxybutyric Acid 1.28 (*)    All other components within normal limits  POTASSIUM - Abnormal; Notable for the following components:   Potassium 7.1 (*)    All other components within normal limits  I-STAT ARTERIAL BLOOD GAS, ED - Abnormal; Notable for the following components:   Acid-base deficit 3.0 (*)    Sodium 133 (*)    All other components within normal limits  TROPONIN I (HIGH SENSITIVITY) - Abnormal; Notable for the following components:   Troponin I (High Sensitivity) 59 (*)    All other components within normal limits  TROPONIN I (HIGH SENSITIVITY) - Abnormal; Notable for the following components:   Troponin I (High Sensitivity) 224 (*)    All other components within normal limits  RESP PANEL BY RT-PCR (FLU A&B, COVID) ARPGX2  D-DIMER, QUANTITATIVE (NOT [facility])  BASIC METABOLIC PANEL     EKG EKG Interpretation  Date/Time:  Monday June 06 2020 09:12:10 EST Ventricular Rate:  110 PR Interval:    QRS Duration: 116 QT Interval:  348 QTC Calculation: 471 R Axis:   160 Text Interpretation: Sinus tachycardia Nonspecific intraventricular conduction delay Anterolateral infarct, old Confirmed by Norman ClayHong, Billy Turvey ([id number]) on 06/06/2020 9:18:41 AM   Radiology DG Chest Port 1 View  Result Date: 06/06/2020 CLINICAL DATA:  Chest pain and shortness of breath since 0600 hours EXAM: PORTABLE CHEST 1 VIEW COMPARISON:  Portable exam 0949 hours compared to 04/19/2020 FINDINGS: Decreased lung volumes versus prior study with accentuation of heart size. Mediastinal contours and pulmonary vascularity normal. Mild bibasilar atelectasis. Upper lungs clear. No infiltrate, pleural effusion or pneumothorax. Osseous structures unremarkable. IMPRESSION: Decreased lung volumes with bibasilar atelectasis. Electronically Signed   By: Ulyses SouthwardMark  Boles M.D.   On: 06/06/2020 09:55    Procedures Procedures (including critical care time)  Medications Ordered in ED Medications  sodium chloride 0.9 % bolus 1,000 mL (0 mLs Intravenous Stopped 06/06/20 1106)  sodium chloride 0.9 % bolus 1,000 mL (0 mLs Intravenous Stopped 06/06/20 1230)  insulin aspart (novoLOG) injection 7 Units (7 Units Intravenous Given 06/06/20 1112)    ED Course  I have reviewed the triage vital signs and the nursing notes.  Pertinent labs & imaging results that were available during my care of the patient were reviewed by me and considered in my medical decision making (see chart for details).    MDM Rules/Calculators/A&P                          Labs show elevated troponin of 59 chemistry also abnormal with elevated blood sugars greater than 400 anion gap of 16, bicarb of 15.  There was some hemolysis indicated in the potassium but it was 6.1.  Patient given IV fluids and IV insulin  repeat labs ordered but persistently hemolyzed and  pending final report.  Repeat troponin elevated to greater than 200.  Patient received aspirin prior to arrival as well as nitroglycerin with improvement of his chest pain.  Cardiology consulted.   Final Clinical Impression(s) / ED Diagnoses Final diagnoses:  ACS (acute coronary syndrome) (HCC)  Elevated troponin    Rx / DC Orders ED Discharge Orders    None       Cheryll Cockayne, MD 06/06/20 1516

## 2020-06-06 NOTE — H&P (Addendum)
History and Physical    Matthew Bernicholas J Yankowski ZOX:[id number] DOB: [birth year] DOA: 06/06/2020  Referring MD/NP/PA:Joshua Audley HoseHong, MD PCP: Leilani Ableeese, Betti, MD  Patient coming from: Via EMS  Chief Complaint: Chest pain  I have personally briefly reviewed patient's [facility]   HPI: Matthew [name] is a 45 y.o. male with medical history significant of HTN, HLD, schizoaffective disorder, ADHD, prediabetes, obesity, and sleep apnea not using CPAP presents with complaints of chest pain.  He describes it as a "crushing" pain across his chest when he woke up this morning.  Reported associated symptoms of some mild shortness of breath.  Denied having any nausea, vomiting, diaphoresis, fevers, chills, cough, or recent sick.  He also denies doing any strenuous activity, heavy lifting, or recent fall to onset of symptoms.  Patient is supposed to be on medication for diabetes, but stopped it a couple months ago.  In route with EMS patient was given full dose aspirin and 2 nitroglycerin.  ED Course: Upon admission into the emergency department patient was seen to be afebrile, heart rate 92-1 15, respirations 11-24, blood pressures 132/98-145/112, and all other vital signs maintained.  EKG was concerning for new diffuse Q waves.  Significant for platelets 137, sodium 129, potassium 6.1, CO2 15, glucose 421, troponin 59->224.  Cardiology had been formally consulted.  Patient has been given 2 L of normal saline IV fluids, 7 units of insulin, and started on heparin drip.  TRH called to admit for  Review of Systems  Constitutional: Negative for fever and malaise/fatigue.  HENT: Negative for congestion and nosebleeds.   Eyes: Negative for photophobia and pain.  Respiratory: Positive for shortness of breath.   Cardiovascular: Positive for chest pain. Negative for leg swelling.  Gastrointestinal: Negative for abdominal pain, nausea and vomiting.  Genitourinary: Negative for dysuria and hematuria.   Musculoskeletal: Negative for falls.  Skin: Negative for itching and rash.  Neurological: Negative for focal weakness and loss of consciousness.  Psychiatric/Behavioral: Negative for memory loss.    Past Medical History:  Diagnosis Date  . Anxiety   . Celiac disease   . Dairy allergy   . Depression   . High cholesterol   . Hypertension   . Paranoia (HCC)   . Pre-diabetes   . Schizoaffective disorder [facility](HCC)     Past Surgical History:  Procedure Laterality Date  . APPENDECTOMY    . BRAIN SURGERY    . NASAL SEPTUM SURGERY    . WRIST FRACTURE SURGERY     right wrist     reports that he has been smoking cigarettes. He has been smoking about 1.00 pack per day. He has never used smokeless tobacco. He reports that he does not drink alcohol and does not use drugs.  Allergies  Allergen Reactions  . Gluten Meal Other (See Comments)    Celiac disease    Family History  Problem Relation Age of Onset  . Asthma Mother   . Hypertension Mother   . Hypertension Father     Prior to Admission medications   Medication Sig Start Date End Date Taking? Authorizing Provider  amphetamine-dextroamphetamine (ADDERALL) 30 MG tablet Take 1 tablet by mouth 2 (two) times daily. 05/20/20 06/19/20 Yes Chauncey MannJennings, Glenn E, MD  atenolol (TENORMIN) 100 MG tablet TAKE 1 TABLET (100 MG TOTAL) BY MOUTH DAILY AFTER BREAKFAST. Patient taking differently: Take 100 mg by mouth every evening. 03/29/20  Yes Chauncey MannJennings, Glenn E, MD  clonazePAM Scarlette Calico(KLONOPIN) 2 MG tablet Take  1 tablet (2 mg total) by mouth 2 (two) times daily as needed for anxiety. 03/29/20  Yes Chauncey Mann, MD  DULoxetine (CYMBALTA) 60 MG capsule TAKE 1 CAPSULE BY MOUTH 2 TIMES DAILY. Patient taking differently: Take 60 mg by mouth 2 (two) times daily. 03/29/20  Yes Chauncey Mann, MD  gabapentin (NEURONTIN) 800 MG tablet TAKE 1 TABLET BY MOUTH THREE TIMES A DAY Patient taking differently: Take 800 mg by mouth 3 (three) times daily. 03/29/20  Yes  Chauncey Mann, MD  lithium carbonate (LITHOBID) 300 MG CR tablet TAKE 2 TABLETS (600 MG TOTAL) BY MOUTH AT BEDTIME. 03/29/20  Yes Chauncey Mann, MD  nicotine polacrilex (NICORETTE) 2 MG gum Take 1 each (2 mg total) by mouth as needed for smoking cessation. 02/11/15  Yes Armandina Stammer I, NP  perphenazine (TRILAFON) 16 MG tablet Take 1 pill total 16 mg every morning and take 2 pills by mouth total 32 mg every bedtime Patient taking differently: Take 16-32 mg by mouth See admin instructions. Take 1 pill total 16 mg every morning and take 2 pills by mouth total 32 mg every bedtime 03/29/20  Yes Chauncey Mann, MD  traZODone (DESYREL) 100 MG tablet TAKE 3 TABLETS BY MOUTH EVERY DAY AT BEDTIME AS NEEDED FOR SLEEP Patient taking differently: Take 300 mg by mouth at bedtime as needed for sleep. TAKE 3 TABLETS BY MOUTH EVERY DAY AT BEDTIME AS NEEDED FOR SLEEP 03/29/20  Yes Chauncey Mann, MD  amphetamine-dextroamphetamine (ADDERALL) 30 MG tablet Take 1 tablet by mouth 2 (two) times daily. 06/19/20 07/19/20  Chauncey Mann, MD  amphetamine-dextroamphetamine (ADDERALL) 30 MG tablet Take 1 tablet by mouth 2 (two) times daily. 04/20/20 05/20/20  Chauncey Mann, MD  atorvastatin (LIPITOR) 20 MG tablet Take 20 mg by mouth daily. Patient not taking: Reported on 06/06/2020 03/28/20   [provider]  clonazePAM (KLONOPIN) 1 MG tablet I po tid X 4 days, then 1 po bid X 4 days, then 1 po daily for 4 days, then 1/2 po daily for 4 days then stop Patient not taking: No sig reported 04/06/20   Chauncey Mann, MD  fluticasone [facility]) 50 MCG/ACT nasal spray Place 1 spray into both nostrils as needed for allergies or rhinitis. Patient not taking: No sig reported 02/11/15   Armandina Stammer I, NP  lisinopril (ZESTRIL) 20 MG tablet Take 20 mg by mouth daily. Patient not taking: Reported on 06/06/2020 03/28/20   [provider]  VICTOZA 18 MG/3ML SOPN Inject 0.6 mg into the skin daily.  Patient not  taking: Reported on 06/06/2020 03/07/20   [provider]  XIGDUO XR 02-999 MG TB24 Take 1 tablet by mouth daily. Patient not taking: Reported on 06/06/2020 03/05/20   [provider]    Physical Exam:  Constitutional: NAD, calm, comfortable Vitals:   06/06/20 1045 06/06/20 1211 06/06/20 1230 06/06/20 1300  BP: (!) 142/106 (!) 145/112 (!) 132/98 (!) 134/103  Pulse: 97 (!) 115 (!) 103 92  Resp: 11 17 17  (!) 24  Temp:      TempSrc:      SpO2: 96% 97% 92% 94%  Weight:      Height:       Eyes: PERRL, lids and conjunctivae normal ENMT: Mucous membranes are moist. Posterior pharynx clear of any exudate or lesions.Normal dentition.  Neck: normal, supple, no masses, no thyromegaly Respiratory: clear to auscultation bilaterally, no wheezing, no crackles. Normal respiratory effort. No accessory muscle  use.  Cardiovascular: Regular rate and rhythm, no murmurs / rubs / gallops. No extremity edema. 2+ pedal pulses. No carotid bruits.  Abdomen: no tenderness, no masses palpated. No hepatosplenomegaly. Bowel sounds positive.  Musculoskeletal: no clubbing / cyanosis. No joint deformity upper and lower extremities. Good ROM, no contractures. Normal muscle tone.  Skin: no rashes, lesions, ulcers. No induration Neurologic: CN 2-12 grossly intact. Sensation intact, DTR normal. Strength 5/5 in all 4.  Psychiatric: Normal judgment and insight. Alert and oriented x 3. Normal mood.     Labs on Admission: I have personally reviewed following labs and imaging studies  CBC: Recent Labs  Lab 06/06/20 0933 06/06/20 1209  WBC 8.7  --   NEUTROABS 5.8  --   HGB 16.5 15.3  HCT 44.9 45.0  MCV 82.7  --   PLT 137*  --    Basic Metabolic Panel: Recent Labs  Lab 06/06/20 0933 06/06/20 1106 06/06/20 1209  NA 129*  --  133*  K 6.1* 7.1* 4.1  CL 98  --   --   CO2 15*  --   --   GLUCOSE 421*  --   --   BUN 12  --   --   CREATININE 1.08  --   --   CALCIUM 8.8*  --   --     GFR: Estimated Creatinine Clearance: 123.9 mL/min (by C-G formula based on SCr of 1.08 mg/dL). Liver Function Tests: No results for input(s): AST, ALT, ALKPHOS, BILITOT, PROT, ALBUMIN in the last 168 hours. No results for input(s): LIPASE, AMYLASE in the last 168 hours. No results for input(s): AMMONIA in the last 168 hours. Coagulation Profile: No results for input(s): INR, PROTIME in the last 168 hours. Cardiac Enzymes: No results for input(s): CKTOTAL, CKMB, CKMBINDEX, TROPONINI in the last 168 hours. BNP (last 3 results) No results for input(s): PROBNP in the last 8760 hours. HbA1C: No results for input(s): HGBA1C in the last 72 hours. CBG: No results for input(s): GLUCAP in the last 168 hours. Lipid Profile: No results for input(s): CHOL, HDL, LDLCALC, TRIG, CHOLHDL, LDLDIRECT in the last 72 hours. Thyroid Function Tests: No results for input(s): TSH, T4TOTAL, FREET4, T3FREE, THYROIDAB in the last 72 hours. Anemia Panel: No results for input(s): VITAMINB12, FOLATE, FERRITIN, TIBC, IRON, RETICCTPCT in the last 72 hours. Urine analysis:    Component Value Date/Time   COLORURINE STRAW (A) 10/20/2018 2147   APPEARANCEUR CLEAR 10/20/2018 2147   LABSPEC 1.006 10/20/2018 2147   PHURINE 6.0 10/20/2018 2147   GLUCOSEU NEGATIVE 10/20/2018 2147   HGBUR SMALL (A) 10/20/2018 2147   BILIRUBINUR NEGATIVE 10/20/2018 2147   KETONESUR NEGATIVE 10/20/2018 2147   PROTEINUR NEGATIVE 10/20/2018 2147   NITRITE NEGATIVE 10/20/2018 2147   LEUKOCYTESUR NEGATIVE 10/20/2018 2147   Sepsis Labs: Recent Results (from the past 240 hour(s))  Resp Panel by RT-PCR (Flu A&B, Covid) Nasopharyngeal Swab     Status: None   Collection Time: 06/06/20  9:33 AM   Specimen: Nasopharyngeal Swab; Nasopharyngeal(NP) swabs in vial transport medium  Result Value Ref Range Status   SARS Coronavirus 2 by RT PCR NEGATIVE NEGATIVE Final    Comment: (NOTE) SARS-CoV-2 target nucleic acids are NOT DETECTED.  The  SARS-CoV-2 RNA is generally detectable in upper respiratory specimens during the acute phase of infection. The lowest concentration of SARS-CoV-2 viral copies this assay can detect is 138 copies/mL. A negative result does not preclude SARS-Cov-2 infection and should not be used as the sole basis  for treatment or other patient management decisions. A negative result may occur with  improper specimen collection/handling, submission of specimen other than nasopharyngeal swab, presence of viral mutation(s) within the areas targeted by this assay, and inadequate number of viral copies(<138 copies/mL). A negative result must be combined with clinical observations, patient history, and epidemiological information. The expected result is Negative.  Fact Sheet for Patients:  BloggerCourse.comhttps://www.fda.gov/media/152166/download  Fact Sheet for Healthcare Providers:  SeriousBroker.ithttps://www.fda.gov/media/152162/download  This test is no t yet approved or cleared by the [place] FDA and  has been authorized for detection and/or diagnosis of SARS-CoV-2 by FDA under an Emergency Use Authorization (EUA). This EUA will remain  in effect (meaning this test can be used) for the duration of the COVID-19 declaration under Section 564(b)(1) of the Act, 21 U.S.C.section 360bbb-3(b)(1), unless the authorization is terminated  or revoked sooner.       Influenza A by PCR NEGATIVE NEGATIVE Final   Influenza B by PCR NEGATIVE NEGATIVE Final    Comment: (NOTE) The Xpert Xpress SARS-CoV-2/FLU/RSV plus assay is intended as an aid in the diagnosis of influenza from Nasopharyngeal swab specimens and should not be used as a sole basis for treatment. Nasal washings and aspirates are unacceptable for Xpert Xpress SARS-CoV-2/FLU/RSV testing.  Fact Sheet for Patients: BloggerCourse.comhttps://www.fda.gov/media/152166/download  Fact Sheet for Healthcare Providers: SeriousBroker.ithttps://www.fda.gov/media/152162/download  This test is not yet approved or  cleared by the [place] FDA and has been authorized for detection and/or diagnosis of SARS-CoV-2 by FDA under an Emergency Use Authorization (EUA). This EUA will remain in effect (meaning this test can be used) for the duration of the COVID-19 declaration under Section 564(b)(1) of the Act, 21 U.S.C. section 360bbb-3(b)(1), unless the authorization is terminated or revoked.  Performed at [facility], [address] [place], [place] [address]      Radiological Exams on Admission: DG Chest Port 1 View  Result Date: 06/06/2020 CLINICAL DATA:  Chest pain and shortness of breath since 0600 hours EXAM: PORTABLE CHEST 1 VIEW COMPARISON:  Portable exam 0949 hours compared to 04/19/2020 FINDINGS: Decreased lung volumes versus prior study with accentuation of heart size. Mediastinal contours and pulmonary vascularity normal. Mild bibasilar atelectasis. Upper lungs clear. No infiltrate, pleural effusion or pneumothorax. Osseous structures unremarkable. IMPRESSION: Decreased lung volumes with bibasilar atelectasis. Electronically Signed   By: Ulyses SouthwardMark  Boles M.D.   On: 06/06/2020 09:55    EKG: Independently reviewed.  Sinus tachycardia 110 bpm with diffuse Q waves  Assessment/Plan NSTEMI: Acute.  Patient presents with complaints of substernal chest pain initial high-sensitivity troponin 59 and repeat to 224.  EKG revealed diffuse appearing Q waves cardiology was consulted and took the for cardiac cath. -Admit to a progressive bed -Heparin per pharmacy -Check UDS and lipid panel -Check echocardiogram -Patient cardiology consultative services, will follow for further recommendations -Noted severe two-vessel obstructive CAD s/p successful PCI of the proximal LAD and distal RCA with DES -Continue aspirin and Brilinta  Diabetes mellitus type 2 with hyperglycemia: Acute.  Patient presents with glucose elevated up to 421 with pseudohyponatremia initial CO2 16.  His last hemoglobin A1c was 6.4.   Prior medications included Victoza and Xigduo, but he had self discontinued these.  He was initially given 7 units of insulin and bolus 2 L of IV fluids while in the ED to avoid being started on insulin drip. -Hypoglycemic protocols -CBGs before every meal and nightly with moderate SSI -Diabetic education -Recheck BMP, patient may need to be placed on  insulin drip  Hyperkalemia: Initial potassium noted to be elevated up at 6.1, but repeat checks noted to be within normal limits. -Continue to monitor  Essential hypertension: Initial blood pressure elevated up to 145/112.  Home medications include atenolol 100 mg nightly and lisinopril 20 mg daily. -Medications changed to Coreg and losartan per cardiology  Schizoaffective disorder -Check lithium level and restart if medically -Continue home Cymbalta and Klonopin   ADHD -Held home medication regimen of Adderall  Thrombocytopenia: Platlet count 137 on admission. -Continue to monitor  Hyperlipidemia  -Continue Crestor 40 mg  OSA: Patient reports that he had a sleep study approximately 3 years ago and has a CPAP machine at home, but states that is not set up properly -CPAP per RT  Obesity: BMI 34.67 kg/m  DVT prophylaxis:heparin  Code Status: Full  Family Communication: none Disposition Plan: Likely discharge home once work-up complete Consults called: Cardiology Admission status: inpatient  Clydie Braun MD [facility]   If 7PM-7AM, please contact night-coverage   06/06/2020, 2:49 PM

## 2020-06-06 NOTE — Interval H&P Note (Signed)
History and Physical Interval Note:  06/06/2020 3:37 PM  Matthew [name]  has presented today for surgery, with the diagnosis of nstemi.  The various methods of treatment have been discussed with the patient and family. After consideration of risks, benefits and other options for treatment, the patient has consented to  Procedure(s): LEFT HEART CATH AND CORONARY ANGIOGRAPHY (N/A) as a surgical intervention.  The patient's history has been reviewed, patient examined, no change in status, stable for surgery.  I have reviewed the patient's chart and labs.  Questions were answered to the patient's satisfaction.   Cath Lab Visit (complete for each Cath Lab visit)  Clinical Evaluation Leading to the Procedure:   ACS: Yes.    Non-ACS:    Anginal Classification: CCS IV  Anti-ischemic medical therapy: Minimal Therapy (1 class of medications)  Non-Invasive Test Results: No non-invasive testing performed  Prior CABG: No previous CABG        Theron Arista [facility] 06/06/2020 3:37 PM

## 2020-06-06 NOTE — Plan of Care (Signed)

## 2020-06-06 NOTE — ED Triage Notes (Signed)
Pt BIB [facility] d/t central-substernal CP that started when he got OOB for the morning. EMS reports giving 6/10 pain before given ASA & 2 Nitro's while in route. Upon arrival to ED pain is 4/10, pt is A/Ox4, verbal- able to make needs known.

## 2020-06-06 NOTE — Consult Note (Addendum)
Cardiology Consultation:   Patient ID: Matthew [name]; [id number]; 1975-08-23   Admit date: 06/06/2020 Date of Consult: 06/06/2020  Primary Care Provider: [name], MD Primary Cardiologist: No primary care provider on file. New Primary Electrophysiologist:  None   Patient Profile:   Matthew [name] is a 45 y.o. male with a hx of HTN, HLD, pre-DM, celiac dz, schizoaffective d/o, anxiety, ADHD, OSA not on CPAP, who is being seen today for the evaluation of CP, elevated troponin at the request of Dr Audley Hose.  History of Present Illness:   Matthew [name] has never had chest pain before.  Today, he woke with chest pain, central. 6-7/10. It was a pressure, but also had some sharp spikes. He was a little SOB with this. No N&V or diaphoresis. Moving around made the pain worse.    He did not take any medications. The chest pain was worse with deep inspiration. He also has some chest wall tenderness.   He goes for walks, does not get CP w/ exertion but does get SOB.   No recent illnesses, saw PCP a few months ago. He was supposed to be on Xigduo and Victoza, but felt bad and quit taking both.  He called EMS, was given ASA 324 mg and SL NTG x 2 on the way to the hospital. That decreased his pain to a 3/10, but it is still there.    Past Medical History:  Diagnosis Date  . Anxiety   . Celiac disease   . Dairy allergy   . Depression   . High cholesterol   . Hypertension   . Paranoia (HCC)   . Pre-diabetes   . Schizoaffective disorder [facility])     Past Surgical History:  Procedure Laterality Date  . APPENDECTOMY    . BRAIN SURGERY    . NASAL SEPTUM SURGERY    . WRIST FRACTURE SURGERY     right wrist     Prior to Admission medications   Medication Sig Start Date End Date Taking? Authorizing Provider  amphetamine-dextroamphetamine (ADDERALL) 30 MG tablet Take 1 tablet by mouth 2 (two) times daily. 05/20/20 06/19/20 Yes Chauncey Mann, MD  atenolol (TENORMIN) 100 MG tablet TAKE 1  TABLET (100 MG TOTAL) BY MOUTH DAILY AFTER BREAKFAST. Patient taking differently: Take 100 mg by mouth every evening. 03/29/20  Yes Chauncey Mann, MD  clonazePAM (KLONOPIN) 2 MG tablet Take 1 tablet (2 mg total) by mouth 2 (two) times daily as needed for anxiety. 03/29/20  Yes Chauncey Mann, MD  DULoxetine (CYMBALTA) 60 MG capsule TAKE 1 CAPSULE BY MOUTH 2 TIMES DAILY. Patient taking differently: Take 60 mg by mouth 2 (two) times daily. 03/29/20  Yes Chauncey Mann, MD  gabapentin (NEURONTIN) 800 MG tablet TAKE 1 TABLET BY MOUTH THREE TIMES A DAY Patient taking differently: Take 800 mg by mouth 3 (three) times daily. 03/29/20  Yes Chauncey Mann, MD  lithium carbonate (LITHOBID) 300 MG CR tablet TAKE 2 TABLETS (600 MG TOTAL) BY MOUTH AT BEDTIME. 03/29/20  Yes Chauncey Mann, MD  nicotine polacrilex (NICORETTE) 2 MG gum Take 1 each (2 mg total) by mouth as needed for smoking cessation. 02/11/15  Yes Armandina Stammer I, NP  perphenazine (TRILAFON) 16 MG tablet Take 1 pill total 16 mg every morning and take 2 pills by mouth total 32 mg every bedtime Patient taking differently: Take 16-32 mg by mouth See admin instructions. Take 1 pill total 16 mg every morning and take 2  pills by mouth total 32 mg every bedtime 03/29/20  Yes Chauncey Mann, MD  traZODone (DESYREL) 100 MG tablet TAKE 3 TABLETS BY MOUTH EVERY DAY AT BEDTIME AS NEEDED FOR SLEEP Patient taking differently: Take 300 mg by mouth at bedtime as needed for sleep. TAKE 3 TABLETS BY MOUTH EVERY DAY AT BEDTIME AS NEEDED FOR SLEEP 03/29/20  Yes Chauncey Mann, MD  amphetamine-dextroamphetamine (ADDERALL) 30 MG tablet Take 1 tablet by mouth 2 (two) times daily. 06/19/20 07/19/20  Chauncey Mann, MD  amphetamine-dextroamphetamine (ADDERALL) 30 MG tablet Take 1 tablet by mouth 2 (two) times daily. 04/20/20 05/20/20  Chauncey Mann, MD  atorvastatin (LIPITOR) 20 MG tablet Take 20 mg by mouth daily. Patient not taking: Reported on 06/06/2020  03/28/20   [provider]  clonazePAM (KLONOPIN) 1 MG tablet I po tid X 4 days, then 1 po bid X 4 days, then 1 po daily for 4 days, then 1/2 po daily for 4 days then stop Patient not taking: No sig reported 04/06/20   Chauncey Mann, MD  fluticasone [facility]) 50 MCG/ACT nasal spray Place 1 spray into both nostrils as needed for allergies or rhinitis. Patient not taking: No sig reported 02/11/15   Armandina Stammer I, NP  lisinopril (ZESTRIL) 20 MG tablet Take 20 mg by mouth daily. Patient not taking: Reported on 06/06/2020 03/28/20   [provider]  VICTOZA 18 MG/3ML SOPN Inject 0.6 mg into the skin daily.  Patient not taking: Reported on 06/06/2020 03/07/20   [provider]  XIGDUO XR 02-999 MG TB24 Take 1 tablet by mouth daily. Patient not taking: Reported on 06/06/2020 03/05/20   [provider]    Inpatient Medications: Scheduled Meds: . heparin  4,000 Units Intravenous Once   Continuous Infusions: . heparin     PRN Meds:   Allergies:    Allergies  Allergen Reactions  . Gluten Meal Other (See Comments)    Celiac disease    Social History:   Social History   Socioeconomic History  . Marital status: Single    Spouse name: Not on file  . Number of children: 0  . Years of education: Not on file  . Highest education level: Not on file  Occupational History  . Occupation: [employer], unemployed  Tobacco Use  . Smoking status: Current Every Day Smoker    Packs/day: 1.00    Types: Cigarettes  . Smokeless tobacco: Never Used  Vaping Use  . Vaping Use: Never used  Substance and Sexual Activity  . Alcohol use: No    Alcohol/week: 0.0 standard drinks    Comment: no (02/08/15)  . Drug use: No  . Sexual activity: Not on file  Other Topics Concern  . Not on file  Social History Narrative   Pt lives at the [facility]   Social Determinants of Health   Financial Resource Strain: Not on file  Food Insecurity: Not on file  Transportation  Needs: Not on file  Physical Activity: Not on file  Stress: Not on file  Social Connections: Not on file  Intimate Partner Violence: Not on file    Family History:   Family History  Problem Relation Age of Onset  . Asthma Mother   . Hypertension Mother   . Hypertension Father    Family Status:  Family Status  Relation Name Status  . Mother  Alive  . Father  Alive    ROS:  Please see the history of present  illness.  All other ROS reviewed and negative.     Physical Exam/Data:   Vitals:   06/06/20 1045 06/06/20 1211 06/06/20 1230 06/06/20 1300  BP: (!) 142/106 (!) 145/112 (!) 132/98 (!) 134/103  Pulse: 97 (!) 115 (!) 103 92  Resp: 11 17 17  (!) 24  Temp:      TempSrc:      SpO2: 96% 97% 92% 94%  Weight:      Height:        Intake/Output Summary (Last 24 hours) at 06/06/2020 1516 Last data filed at 06/06/2020 1230 Gross per 24 hour  Intake 2000 ml  Output -  Net 2000 ml    Last 3 Weights 06/06/2020 04/19/2020 04/11/2020  Weight (lbs) 270 lb 275 lb 269 lb  Weight (kg) 122.471 kg 124.739 kg 122.018 kg     Body mass index is 34.67 kg/m.   General:  Well nourished, well developed, male in no acute distress HEENT: normal Lymph: no adenopathy Neck: JVD - not elevated Endocrine:  No thryomegaly Vascular: No carotid bruits; 4/4 extremity pulses 2+  Cardiac:  normal S1, S2; RRR; no murmur Lungs:  clear bilaterally, no wheezing, rhonchi or rales  Abd: soft, nontender, no hepatomegaly  Ext: no edema Musculoskeletal:  No deformities, BUE and BLE strength normal and equal Skin: warm and dry  Neuro:  CNs 2-12 intact, no focal abnormalities noted Psych:  Normal affect   EKG:  The EKG was personally reviewed and demonstrates:  SR, NS IVCD Telemetry:  Telemetry was personally reviewed and demonstrates:  SR, ST   CV studies:   None   Laboratory Data:   Chemistry Recent Labs  Lab 06/06/20 0933 06/06/20 1106 06/06/20 1209  NA 129*  --  133*  K 6.1* 7.1* 4.1   CL 98  --   --   CO2 15*  --   --   GLUCOSE 421*  --   --   BUN 12  --   --   CREATININE 1.08  --   --   CALCIUM 8.8*  --   --   GFRNONAA >60  --   --   ANIONGAP 16*  --   --     Lab Results  Component Value Date   ALT 46 (H) 10/21/2018   AST 24 10/21/2018   ALKPHOS 75 10/21/2018   BILITOT 0.4 10/21/2018   Hematology Recent Labs  Lab 06/06/20 0933 06/06/20 1209  WBC 8.7  --   RBC 5.43  --   HGB 16.5 15.3  HCT 44.9 45.0  MCV 82.7  --   MCH 30.4  --   MCHC 36.7*  --   RDW 13.9  --   PLT 137*  --    Cardiac Enzymes High Sensitivity Troponin:   Recent Labs  Lab 06/06/20 0933 06/06/20 1106  TROPONINIHS 59* 224*      BNPNo results for input(s): BNP, PROBNP in the last 168 hours.  DDimer  Recent Labs  Lab 06/06/20 0933  DDIMER 0.49   TSH:  Lab Results  Component Value Date   TSH 7.418 (H) 10/21/2018   Lipids: Lab Results  Component Value Date   CHOL 278 (H) 10/21/2018   HDL 28 (L) 10/21/2018   LDLCALC UNABLE TO CALCULATE IF TRIGLYCERIDE OVER 400 mg/dL 12/21/2018   LDLDIRECT 127.3 (H) 10/21/2018   TRIG 571 (H) 10/21/2018   CHOLHDL 9.9 10/21/2018   HgbA1c: Lab Results  Component Value Date   HGBA1C 6.4 (H) 10/21/2018  Magnesium: No results found for: MG   Radiology/Studies:  DG Chest Port 1 View  Result Date: 06/06/2020 CLINICAL DATA:  Chest pain and shortness of breath since 0600 hours EXAM: PORTABLE CHEST 1 VIEW COMPARISON:  Portable exam 0949 hours compared to 04/19/2020 FINDINGS: Decreased lung volumes versus prior study with accentuation of heart size. Mediastinal contours and pulmonary vascularity normal. Mild bibasilar atelectasis. Upper lungs clear. No infiltrate, pleural effusion or pneumothorax. Osseous structures unremarkable. IMPRESSION: Decreased lung volumes with bibasilar atelectasis. Electronically Signed   By: Ulyses SouthwardMark  Boles M.D.   On: 06/06/2020 09:55    Assessment and Plan:   1. NSTEMI - trop trending up, CP improved w/ SL  NTG - ECG not a STEMI - however, some atypical components, w/ sx worse w/ deep inspiration and +chest wall tenderness. - ck CKMB, ck echo - however, w/ ongoing pain and elevated trop, will need to definitively r/o CAD  - cath today  2. DM - pt not taking rx due to side effects - per IM  3. OSA not on CPAP - pt says machine not set up - need to get pt set up with f/u for this.  4. HTN  - BP slightly above target, on atenolol 100 mg qhs and lisinopril 20 mg qd - follow BP in hospital, may need med adjustments.  5. HLD - check profile - ck compliance w/ meds  Otherwise, per IM Active Problems:   Chest pain    For questions or updates, please contact [facility] Please consult [email or web address] for contact info under Cardiology/STEMI.   Signed, Theodore DemarkRhonda Barrett, PA-C  06/06/2020 3:16 PM  Patient seen and examined and agree with Theodore Demarkhonda Barrett, PA-C above.  In brief, the patient is a 45 year old male with history of HTN, HLD, DMII, celiac disease, schizoaffective disorder, OSA not on CPAP who presented to the ED with substernal chest pressure that is both "crushing" and "sharp" found to have trop 59-->224 consistent with NSTEMI. ECG with sinus tach, IVCD, anterior q-waves and inferior q-waves. Currently having ongoing mild chest discomfort that improved with SL-NTG. Has multiple risk factors including diabetes, HLD, HTN and obesity. Given ongoing chest discomfort and rising troponin, will plan on coronary angiography.  GEN: No acute distress. Mildly diaphoretic   Neck: No JVD Cardiac: Tachycardic, regular, 1/6 systolic murmur Respiratory: Clear to auscultation bilaterally. GI: Soft, nontender, non-distended  MS: No edema; No deformity. Neuro:  Nonfocal  Psych: Normal affect   Plan: -Plan for coronary angiography today; keep NPO -Continue heparin gtt -S/p ASA 325mg  in ED, start ASA 81mg  daily tomorrow -Start crestor 20mg  daily -Will add BB, ACE/ARB post-cath pending BP,  TTE findings, and renal function -Needs aggressive lifestyle and risk factor modification including treatment of his underlying DMII and OSA -Needs CPAP machine to be set-up as out-patient  INFORMED CONSENT: I have reviewed the risks, indications, and alternatives to cardiac catheterization, possible angioplasty, and stenting with the patient. Risks include but are not limited to bleeding, infection, vascular injury, stroke, myocardial infection, arrhythmia, kidney injury, radiation-related injury in the case of prolonged fluoroscopy use, emergency cardiac surgery, and death. The patient understands the risks of serious complication is 1-2 in 1000 with diagnostic cardiac cath and 1-2% or less with angioplasty/stenting.    Laurance FlattenHeather Jaimeson Gopal, MD

## 2020-06-06 NOTE — Progress Notes (Signed)
ANTICOAGULATION CONSULT NOTE - Initial Consult  Pharmacy Consult for heparin Indication: chest pain/ACS  Allergies  Allergen Reactions  . Gluten Meal Other (See Comments)    Celiac disease    Patient Measurements: Height: 6\' 2"  (188 cm) Weight: 122.5 kg (270 lb) IBW/kg (Calculated) : 82.2 Heparin Dosing Weight: 108kg  Vital Signs: Temp: 97.7 F (36.5 C) (01/17 0911) Temp Source: Oral (01/17 0911) BP: 134/103 (01/17 1300) Pulse Rate: 92 (01/17 1300)  Labs: Recent Labs    06/06/20 0933 06/06/20 1106 06/06/20 1209  HGB 16.5  --  15.3  HCT 44.9  --  45.0  PLT 137*  --   --   CREATININE 1.08  --   --   TROPONINIHS 59* 224*  --     Estimated Creatinine Clearance: 123.9 mL/min (by C-G formula based on SCr of 1.08 mg/dL).   Medical History: Past Medical History:  Diagnosis Date  . Anxiety   . Celiac disease   . Dairy allergy   . Depression   . High cholesterol   . Hypertension   . Paranoia (HCC)   . Pre-diabetes   . Schizoaffective disorder (HCC)     Assessment: 54 YOM presenting with CP, not on anticoagulation PTA, H/H wnl, plts 137.    Goal of Therapy:  Heparin level 0.3-0.7 units/ml Monitor platelets by anticoagulation protocol: Yes   Plan:  Heparin 4000 units IV x1, and gtt at 1300 units/hr F/u 6 hour heparin level, cards plan  45, PharmD Clinical Pharmacist ED Pharmacist Phone # [phone] 06/06/2020 2:35 PM

## 2020-06-06 NOTE — H&P (View-Only) (Signed)
Cardiology Consultation:   Patient ID: TUKKER BYRNS; [id number]; 1975-08-23   Admit date: 06/06/2020 Date of Consult: 06/06/2020  Primary Care Provider: [name], MD Primary Cardiologist: No primary care provider on file. New Primary Electrophysiologist:  None   Patient Profile:   Matthew [name] is a 45 y.o. male with a hx of HTN, HLD, pre-DM, celiac dz, schizoaffective d/o, anxiety, ADHD, OSA not on CPAP, who is being seen today for the evaluation of CP, elevated troponin at the request of Dr Audley Hose.  History of Present Illness:   [name] has never had chest pain before.  Today, he woke with chest pain, central. 6-7/10. It was a pressure, but also had some sharp spikes. He was a little SOB with this. No N&V or diaphoresis. Moving around made the pain worse.    He did not take any medications. The chest pain was worse with deep inspiration. He also has some chest wall tenderness.   He goes for walks, does not get CP w/ exertion but does get SOB.   No recent illnesses, saw PCP a few months ago. He was supposed to be on Xigduo and Victoza, but felt bad and quit taking both.  He called EMS, was given ASA 324 mg and SL NTG x 2 on the way to the hospital. That decreased his pain to a 3/10, but it is still there.    Past Medical History:  Diagnosis Date  . Anxiety   . Celiac disease   . Dairy allergy   . Depression   . High cholesterol   . Hypertension   . Paranoia (HCC)   . Pre-diabetes   . Schizoaffective disorder [facility])     Past Surgical History:  Procedure Laterality Date  . APPENDECTOMY    . BRAIN SURGERY    . NASAL SEPTUM SURGERY    . WRIST FRACTURE SURGERY     right wrist     Prior to Admission medications   Medication Sig Start Date End Date Taking? Authorizing Provider  amphetamine-dextroamphetamine (ADDERALL) 30 MG tablet Take 1 tablet by mouth 2 (two) times daily. 05/20/20 06/19/20 Yes Chauncey Mann, MD  atenolol (TENORMIN) 100 MG tablet TAKE 1  TABLET (100 MG TOTAL) BY MOUTH DAILY AFTER BREAKFAST. Patient taking differently: Take 100 mg by mouth every evening. 03/29/20  Yes Chauncey Mann, MD  clonazePAM (KLONOPIN) 2 MG tablet Take 1 tablet (2 mg total) by mouth 2 (two) times daily as needed for anxiety. 03/29/20  Yes Chauncey Mann, MD  DULoxetine (CYMBALTA) 60 MG capsule TAKE 1 CAPSULE BY MOUTH 2 TIMES DAILY. Patient taking differently: Take 60 mg by mouth 2 (two) times daily. 03/29/20  Yes Chauncey Mann, MD  gabapentin (NEURONTIN) 800 MG tablet TAKE 1 TABLET BY MOUTH THREE TIMES A DAY Patient taking differently: Take 800 mg by mouth 3 (three) times daily. 03/29/20  Yes Chauncey Mann, MD  lithium carbonate (LITHOBID) 300 MG CR tablet TAKE 2 TABLETS (600 MG TOTAL) BY MOUTH AT BEDTIME. 03/29/20  Yes Chauncey Mann, MD  nicotine polacrilex (NICORETTE) 2 MG gum Take 1 each (2 mg total) by mouth as needed for smoking cessation. 02/11/15  Yes Armandina Stammer I, NP  perphenazine (TRILAFON) 16 MG tablet Take 1 pill total 16 mg every morning and take 2 pills by mouth total 32 mg every bedtime Patient taking differently: Take 16-32 mg by mouth See admin instructions. Take 1 pill total 16 mg every morning and take 2  pills by mouth total 32 mg every bedtime 03/29/20  Yes Chauncey Mann, MD  traZODone (DESYREL) 100 MG tablet TAKE 3 TABLETS BY MOUTH EVERY DAY AT BEDTIME AS NEEDED FOR SLEEP Patient taking differently: Take 300 mg by mouth at bedtime as needed for sleep. TAKE 3 TABLETS BY MOUTH EVERY DAY AT BEDTIME AS NEEDED FOR SLEEP 03/29/20  Yes Chauncey Mann, MD  amphetamine-dextroamphetamine (ADDERALL) 30 MG tablet Take 1 tablet by mouth 2 (two) times daily. 06/19/20 07/19/20  Chauncey Mann, MD  amphetamine-dextroamphetamine (ADDERALL) 30 MG tablet Take 1 tablet by mouth 2 (two) times daily. 04/20/20 05/20/20  Chauncey Mann, MD  atorvastatin (LIPITOR) 20 MG tablet Take 20 mg by mouth daily. Patient not taking: Reported on 06/06/2020  03/28/20   [provider]  clonazePAM (KLONOPIN) 1 MG tablet I po tid X 4 days, then 1 po bid X 4 days, then 1 po daily for 4 days, then 1/2 po daily for 4 days then stop Patient not taking: No sig reported 04/06/20   Chauncey Mann, MD  fluticasone [facility]) 50 MCG/ACT nasal spray Place 1 spray into both nostrils as needed for allergies or rhinitis. Patient not taking: No sig reported 02/11/15   Armandina Stammer I, NP  lisinopril (ZESTRIL) 20 MG tablet Take 20 mg by mouth daily. Patient not taking: Reported on 06/06/2020 03/28/20   [provider]  VICTOZA 18 MG/3ML SOPN Inject 0.6 mg into the skin daily.  Patient not taking: Reported on 06/06/2020 03/07/20   [provider]  XIGDUO XR 02-999 MG TB24 Take 1 tablet by mouth daily. Patient not taking: Reported on 06/06/2020 03/05/20   [provider]    Inpatient Medications: Scheduled Meds: . heparin  4,000 Units Intravenous Once   Continuous Infusions: . heparin     PRN Meds:   Allergies:    Allergies  Allergen Reactions  . Gluten Meal Other (See Comments)    Celiac disease    Social History:   Social History   Socioeconomic History  . Marital status: Single    Spouse name: Not on file  . Number of children: 0  . Years of education: Not on file  . Highest education level: Not on file  Occupational History  . Occupation: [employer], unemployed  Tobacco Use  . Smoking status: Current Every Day Smoker    Packs/day: 1.00    Types: Cigarettes  . Smokeless tobacco: Never Used  Vaping Use  . Vaping Use: Never used  Substance and Sexual Activity  . Alcohol use: No    Alcohol/week: 0.0 standard drinks    Comment: no (02/08/15)  . Drug use: No  . Sexual activity: Not on file  Other Topics Concern  . Not on file  Social History Narrative   Pt lives at the [facility]   Social Determinants of Health   Financial Resource Strain: Not on file  Food Insecurity: Not on file  Transportation  Needs: Not on file  Physical Activity: Not on file  Stress: Not on file  Social Connections: Not on file  Intimate Partner Violence: Not on file    Family History:   Family History  Problem Relation Age of Onset  . Asthma Mother   . Hypertension Mother   . Hypertension Father    Family Status:  Family Status  Relation Name Status  . Mother  Alive  . Father  Alive    ROS:  Please see the history of present  illness.  All other ROS reviewed and negative.     Physical Exam/Data:   Vitals:   06/06/20 1045 06/06/20 1211 06/06/20 1230 06/06/20 1300  BP: (!) 142/106 (!) 145/112 (!) 132/98 (!) 134/103  Pulse: 97 (!) 115 (!) 103 92  Resp: 11 17 17 (!) 24  Temp:      TempSrc:      SpO2: 96% 97% 92% 94%  Weight:      Height:        Intake/Output Summary (Last 24 hours) at 06/06/2020 1516 Last data filed at 06/06/2020 1230 Gross per 24 hour  Intake 2000 ml  Output -  Net 2000 ml    Last 3 Weights 06/06/2020 04/19/2020 04/11/2020  Weight (lbs) 270 lb 275 lb 269 lb  Weight (kg) 122.471 kg 124.739 kg 122.018 kg     Body mass index is 34.67 kg/m.   General:  Well nourished, well developed, male in no acute distress HEENT: normal Lymph: no adenopathy Neck: JVD - not elevated Endocrine:  No thryomegaly Vascular: No carotid bruits; 4/4 extremity pulses 2+  Cardiac:  normal S1, S2; RRR; no murmur Lungs:  clear bilaterally, no wheezing, rhonchi or rales  Abd: soft, nontender, no hepatomegaly  Ext: no edema Musculoskeletal:  No deformities, BUE and BLE strength normal and equal Skin: warm and dry  Neuro:  CNs 2-12 intact, no focal abnormalities noted Psych:  Normal affect   EKG:  The EKG was personally reviewed and demonstrates:  SR, NS IVCD Telemetry:  Telemetry was personally reviewed and demonstrates:  SR, ST   CV studies:   None   Laboratory Data:   Chemistry Recent Labs  Lab 06/06/20 0933 06/06/20 1106 06/06/20 1209  NA 129*  --  133*  K 6.1* 7.1* 4.1   CL 98  --   --   CO2 15*  --   --   GLUCOSE 421*  --   --   BUN 12  --   --   CREATININE 1.08  --   --   CALCIUM 8.8*  --   --   GFRNONAA >60  --   --   ANIONGAP 16*  --   --     Lab Results  Component Value Date   ALT 46 (H) 10/21/2018   AST 24 10/21/2018   ALKPHOS 75 10/21/2018   BILITOT 0.4 10/21/2018   Hematology Recent Labs  Lab 06/06/20 0933 06/06/20 1209  WBC 8.7  --   RBC 5.43  --   HGB 16.5 15.3  HCT 44.9 45.0  MCV 82.7  --   MCH 30.4  --   MCHC 36.7*  --   RDW 13.9  --   PLT 137*  --    Cardiac Enzymes High Sensitivity Troponin:   Recent Labs  Lab 06/06/20 0933 06/06/20 1106  TROPONINIHS 59* 224*      BNPNo results for input(s): BNP, PROBNP in the last 168 hours.  DDimer  Recent Labs  Lab 06/06/20 0933  DDIMER 0.49   TSH:  Lab Results  Component Value Date   TSH 7.418 (H) 10/21/2018   Lipids: Lab Results  Component Value Date   CHOL 278 (H) 10/21/2018   HDL 28 (L) 10/21/2018   LDLCALC UNABLE TO CALCULATE IF TRIGLYCERIDE OVER 400 mg/dL 10/21/2018   LDLDIRECT 127.3 (H) 10/21/2018   TRIG 571 (H) 10/21/2018   CHOLHDL 9.9 10/21/2018   HgbA1c: Lab Results  Component Value Date   HGBA1C 6.4 (H) 10/21/2018     Magnesium: No results found for: MG   Radiology/Studies:  DG Chest Port 1 View  Result Date: 06/06/2020 CLINICAL DATA:  Chest pain and shortness of breath since 0600 hours EXAM: PORTABLE CHEST 1 VIEW COMPARISON:  Portable exam 0949 hours compared to 04/19/2020 FINDINGS: Decreased lung volumes versus prior study with accentuation of heart size. Mediastinal contours and pulmonary vascularity normal. Mild bibasilar atelectasis. Upper lungs clear. No infiltrate, pleural effusion or pneumothorax. Osseous structures unremarkable. IMPRESSION: Decreased lung volumes with bibasilar atelectasis. Electronically Signed   By: Mark  Boles M.D.   On: 06/06/2020 09:55    Assessment and Plan:   1. NSTEMI - trop trending up, CP improved w/ SL  NTG - ECG not a STEMI - however, some atypical components, w/ sx worse w/ deep inspiration and +chest wall tenderness. - ck CKMB, ck echo - however, w/ ongoing pain and elevated trop, will need to definitively r/o CAD  - cath today  2. DM - pt not taking rx due to side effects - per IM  3. OSA not on CPAP - pt says machine not set up - need to get pt set up with f/u for this.  4. HTN  - BP slightly above target, on atenolol 100 mg qhs and lisinopril 20 mg qd - follow BP in hospital, may need med adjustments.  5. HLD - check profile - ck compliance w/ meds  Otherwise, per IM Active Problems:   Chest pain    For questions or updates, please contact [facility] Please consult [email or web address] for contact info under Cardiology/STEMI.   Signed, Rhonda Barrett, PA-C  06/06/2020 3:16 PM  Patient seen and examined and agree with Rhonda Barrett, PA-C above.  In brief, the patient is a 44 year old male with history of HTN, HLD, DMII, celiac disease, schizoaffective disorder, OSA not on CPAP who presented to the ED with substernal chest pressure that is both "crushing" and "sharp" found to have trop 59-->224 consistent with NSTEMI. ECG with sinus tach, IVCD, anterior q-waves and inferior q-waves. Currently having ongoing mild chest discomfort that improved with SL-NTG. Has multiple risk factors including diabetes, HLD, HTN and obesity. Given ongoing chest discomfort and rising troponin, will plan on coronary angiography.  GEN: No acute distress. Mildly diaphoretic   Neck: No JVD Cardiac: Tachycardic, regular, 1/6 systolic murmur Respiratory: Clear to auscultation bilaterally. GI: Soft, nontender, non-distended  MS: No edema; No deformity. Neuro:  Nonfocal  Psych: Normal affect   Plan: -Plan for coronary angiography today; keep NPO -Continue heparin gtt -S/p ASA 325mg in ED, start ASA 81mg daily tomorrow -Start crestor 20mg daily -Will add BB, ACE/ARB post-cath pending BP,  TTE findings, and renal function -Needs aggressive lifestyle and risk factor modification including treatment of his underlying DMII and OSA -Needs CPAP machine to be set-up as out-patient  INFORMED CONSENT: I have reviewed the risks, indications, and alternatives to cardiac catheterization, possible angioplasty, and stenting with the patient. Risks include but are not limited to bleeding, infection, vascular injury, stroke, myocardial infection, arrhythmia, kidney injury, radiation-related injury in the case of prolonged fluoroscopy use, emergency cardiac surgery, and death. The patient understands the risks of serious complication is 1-2 in 1000 with diagnostic cardiac cath and 1-2% or less with angioplasty/stenting.    Delisa Finck, MD 

## 2020-06-06 NOTE — Plan of Care (Signed)

## 2020-06-07 ENCOUNTER — Encounter (HOSPITAL_COMMUNITY): Payer: Self-pay | Admitting: Cardiology

## 2020-06-07 ENCOUNTER — Telehealth: Payer: Self-pay | Admitting: Physician Assistant

## 2020-06-07 ENCOUNTER — Inpatient Hospital Stay (HOSPITAL_COMMUNITY): Payer: Medicaid Other

## 2020-06-07 DIAGNOSIS — I214 Non-ST elevation (NSTEMI) myocardial infarction: Secondary | ICD-10-CM

## 2020-06-07 LAB — BASIC METABOLIC PANEL
Anion gap: 12 (ref 5–15)
BUN: 11 mg/dL (ref 6–20)
CO2: 21 mmol/L — ABNORMAL LOW (ref 22–32)
Calcium: 9 mg/dL (ref 8.9–10.3)
Chloride: 100 mmol/L (ref 98–111)
Creatinine, Ser: 1.17 mg/dL (ref 0.61–1.24)
GFR, Estimated: >60 mL/min (ref >60)
Glucose, Bld: 263 mg/dL — ABNORMAL HIGH (ref 70–99)
Potassium: 4.3 mmol/L (ref 3.5–5.1)
Sodium: 133 mmol/L — ABNORMAL LOW (ref 135–145)

## 2020-06-07 LAB — CBC
HCT: 45.9 % (ref 39.0–52.0)
Hemoglobin: 15.7 g/dL (ref 13.0–17.0)
MCH: 28.3 pg (ref 26.0–34.0)
MCHC: 34.2 g/dL (ref 30.0–36.0)
MCV: 82.7 fL (ref 80.0–100.0)
Platelets: 171 10*3/uL (ref 150–400)
RBC: 5.55 MIL/uL (ref 4.22–5.81)
RDW: 13.9 % (ref 11.5–15.5)
WBC: 13.3 10*3/uL — ABNORMAL HIGH (ref 4.0–10.5)
nRBC: 0 % (ref 0.0–0.2)

## 2020-06-07 LAB — BRAIN NATRIURETIC PEPTIDE: B Natriuretic Peptide: 151.1 pg/mL — ABNORMAL HIGH (ref 0.0–100.0)

## 2020-06-07 LAB — PHOSPHORUS: Phosphorus: 2.6 mg/dL (ref 2.5–4.6)

## 2020-06-07 LAB — ALBUMIN: Albumin: 3.8 g/dL (ref 3.5–5.0)

## 2020-06-07 LAB — MAGNESIUM: Magnesium: 1.9 mg/dL (ref 1.7–2.4)

## 2020-06-07 IMAGING — DX DG CHEST 1V
1 series · 1 of 1 positions shown · non-contrast
Comparison: Radiograph [DATE], CT [DATE]

CLINICAL DATA: Shortness of breath chest pain

EXAM:
CHEST  1 VIEW

[chest ap]
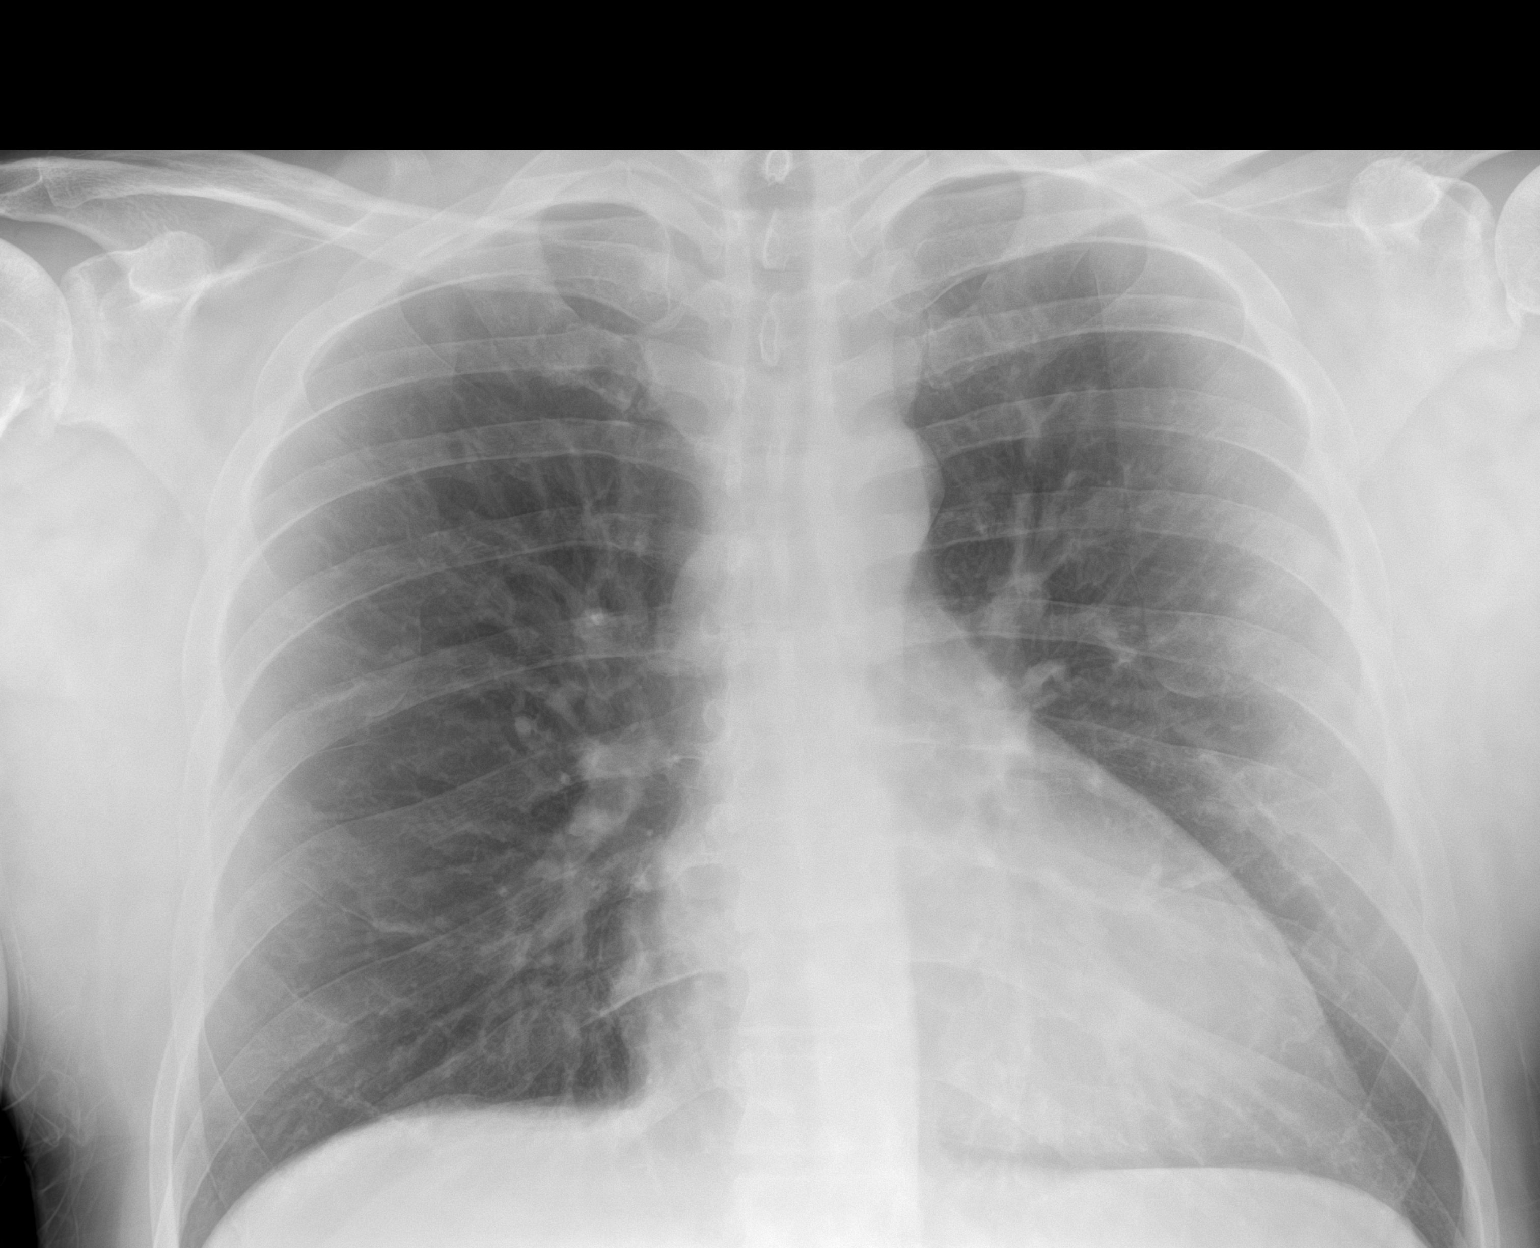

[1 of 1 positions shown; findings below may reference images not displayed]

FINDINGS: Accounting for body habitus, the lungs are clear. Improving volumes
with diminished basilar atelectatic changes. No consolidation,
features of edema, pneumothorax, or effusion. Pulmonary vascularity
is normally distributed. The cardiomediastinal contours are
unremarkable. No acute osseous or soft tissue abnormality.
IMPRESSION: Improving volumes with diminished basilar atelectatic changes. No
acute cardiopulmonary disease.

## 2020-06-07 MED ORDER — ASPIRIN EC 81 MG PO TBEC: 81.0000 mg | DELAYED_RELEASE_TABLET | Freq: Every day | ORAL | 3 refills | Status: DC

## 2020-06-07 MED ORDER — INSULIN GLARGINE 100 UNIT/ML ~~LOC~~ SOLN
15.0000 [IU] | Freq: Every day | SUBCUTANEOUS | Status: DC
  Administered 2020-06-07: 15 [IU] via SUBCUTANEOUS
  Filled 2020-06-07 (×2): qty 0.15

## 2020-06-07 MED ORDER — INSULIN ASPART 100 UNIT/ML ~~LOC~~ SOLN: 0.0000 [IU] | Freq: Three times a day (TID) | SUBCUTANEOUS | Status: DC

## 2020-06-07 MED ORDER — LACTATED RINGERS IV SOLN: INTRAVENOUS | Status: DC

## 2020-06-07 MED ORDER — CLONAZEPAM 0.5 MG PO TABS: 1.0000 mg | ORAL_TABLET | Freq: Once | ORAL | Status: DC

## 2020-06-07 MED ORDER — TICAGRELOR 90 MG PO TABS: 90.0000 mg | ORAL_TABLET | Freq: Two times a day (BID) | ORAL | 11 refills | Status: DC

## 2020-06-07 MED ORDER — INSULIN ASPART 100 UNIT/ML ~~LOC~~ SOLN: 5.0000 [IU] | Freq: Three times a day (TID) | SUBCUTANEOUS | Status: DC

## 2020-06-07 MED FILL — Heparin Sod (Porcine)-NaCl IV Soln 1000 Unit/500ML-0.9%: INTRAVENOUS | Qty: 500 | Status: AC

## 2020-06-07 NOTE — Telephone Encounter (Signed)
Spoke with pt and went over information from [facility], [place].  Pt agreeable to get ASA and Brilinta picked up today.  He will come tomorrow for an appointment with Jacolyn Reedy, PA-C.  Went over ER parameters and spoke about keeping cath site clean.  Pt aware to call the office back today if Brilinta is not affordable.

## 2020-06-07 NOTE — Telephone Encounter (Addendum)
I assigned the rx's to Dr. Johney Frame since she saw the patient (I have not formally met him). Anderson Malta, can you call pharmacy to make sure they definitely received the rx and have in stock? Just do not want there to be any barriers to picking up. Re: Brilinta, needs to take q12hr to get back on track ASAP. Axcel Horsch PA-C

## 2020-06-07 NOTE — Discharge Summary (Signed)
Physician Discharge Summary  Patient ID: [name] MRN: [id number] DOB/AGE: 43/15/1977 43 y.o.  Admit date: 06/06/2020 Discharge date: 06/07/2020  Admission Diagnoses:  Discharge Diagnoses:  Principal Problem:   Non-ST elevation (NSTEMI) myocardial infarction [facility]) Active Problems:   OSA (obstructive sleep apnea)   Attention deficit hyperactivity disorder (ADHD), combined type, moderate   Chest pain   Type 2 diabetes mellitus with hyperglycemia (HCC)   Hyperkalemia   Thrombocytopenia (HCC)   Obesity (BMI 30.0-34.9)   Discharged Condition: stable  Hospital Course:   Matthew [name] is a 45 y.o. male with medical history significant of HTN, HLD, schizoaffective disorder, ADHD, prediabetes, obesity, and sleep apnea not using CPAP presenting with complaints of chest pain.    Upon evaluation in the emergency department,  EKG was concerning for new diffuse Q waves.   Troponin was found to be elevated, initially 59 followed by 224.  Cardiology had been formally consulted.  Patient was additionally found to be in mild diabetic ketoacidosis.  The hospitalist group was then called to assess the patient admission in the hospital.  Cardiology brought the patient to the Cath Lab with a cath revealing severe two-vessel obstructive coronary artery disease of the LAD and RCA.  A drug-eluting stent was successfully placed in each vessel.  Additionally, patient was found to have significant left ventricular dysfunction with an ejection fraction of 25 to 35%.    Patient was brought to the progressive unit for admission after the cardiac catheterization.  Patient was initiated on a basal bolus insulin regimen to attempt to begin to achieve control of patient's significant hyperglycemia due to uncontrolled diabetes mellitus.  Early in the morning on 1/18, the patient had suddenly decided to leave AGAINST MEDICAL ADVICE.  Extensive attempts were made to attempt to convince the patient to remain in  the hospital by the nursing staff.  Patient was advised that leaving the hospital could result in significant morbidity and even death.  Patient still decided to proceed with leaving AGAINST MEDICAL ADVICE.  Patient was advised to seek medical attention as quickly as possible.   Consults: cardiology  Significant Diagnostic Studies:   Left Heart Cath 06/06/2020:   1. Severe 2 vessel obstructive CAD    - 99% proximal LAD    - 90% distal RCA 2. Severe LV dysfunction. EF estimated at 25-30%. Segmental wall motion abnormalities. 3. Moderately elevated LVEDP 4. Successful PCI of the proximal LAD with IVUS guidance and DES x 1 5. Successful PCI of the distal RCA with IVUS guidance and DES x 1    Discharge Exam: Blood pressure (!) 143/102, pulse (!) 110, temperature 97.7 F (36.5 C), temperature source Oral, resp. rate 16, height 6\' 2"  (1.88 m), weight 122.5 kg, SpO2 97 %. Unable to perform, patient left AGAINST MEDICAL ADVICE  Disposition:    Allergies as of 06/07/2020      Reactions   Gluten Meal Other (See Comments)   Celiac disease      Medication List    TAKE these medications   amphetamine-dextroamphetamine 30 MG tablet Commonly known as: ADDERALL Take 1 tablet by mouth 2 (two) times daily.   amphetamine-dextroamphetamine 30 MG tablet Commonly known as: ADDERALL Take 1 tablet by mouth 2 (two) times daily.   amphetamine-dextroamphetamine 30 MG tablet Commonly known as: ADDERALL Take 1 tablet by mouth 2 (two) times daily. Start taking on: June 19, 2020   atenolol 100 MG tablet Commonly known as: TENORMIN TAKE 1 TABLET (100 MG TOTAL) BY  MOUTH DAILY AFTER BREAKFAST. What changed: when to take this   atorvastatin 20 MG tablet Commonly known as: LIPITOR Take 20 mg by mouth daily.   clonazePAM 2 MG tablet Commonly known as: KLONOPIN Take 1 tablet (2 mg total) by mouth 2 (two) times daily as needed for anxiety.   clonazePAM 1 MG tablet Commonly known as:  KLONOPIN I po tid X 4 days, then 1 po bid X 4 days, then 1 po daily for 4 days, then 1/2 po daily for 4 days then stop   DULoxetine 60 MG capsule Commonly known as: CYMBALTA TAKE 1 CAPSULE BY MOUTH 2 TIMES DAILY. What changed: See the new instructions.   fluticasone 50 MCG/ACT nasal spray Commonly known as: FLONASE Place 1 spray into both nostrils as needed for allergies or rhinitis.   gabapentin 800 MG tablet Commonly known as: NEURONTIN TAKE 1 TABLET BY MOUTH THREE TIMES A DAY   lisinopril 20 MG tablet Commonly known as: ZESTRIL Take 20 mg by mouth daily.   lithium carbonate 300 MG CR tablet Commonly known as: LITHOBID TAKE 2 TABLETS (600 MG TOTAL) BY MOUTH AT BEDTIME.   nicotine polacrilex 2 MG gum Commonly known as: NICORETTE Take 1 each (2 mg total) by mouth as needed for smoking cessation.   perphenazine 16 MG tablet Commonly known as: TRILAFON Take 1 pill total 16 mg every morning and take 2 pills by mouth total 32 mg every bedtime What changed:   how much to take  how to take this  when to take this   traZODone 100 MG tablet Commonly known as: DESYREL TAKE 3 TABLETS BY MOUTH EVERY DAY AT BEDTIME AS NEEDED FOR SLEEP What changed:   how much to take  how to take this  when to take this  reasons to take this   Victoza 18 MG/3ML Sopn Generic drug: liraglutide Inject 0.6 mg into the skin daily.   Xigduo XR 02-999 MG Tb24 Generic drug: Dapagliflozin-metFORMIN HCl ER Take 1 tablet by mouth daily.        Signed: Deno Lunger Oluwademilade Kellett 06/07/2020, 6:45 AM

## 2020-06-07 NOTE — Telephone Encounter (Signed)
Called the pharmacy and they said they have plenty in stock.

## 2020-06-07 NOTE — Progress Notes (Signed)
Pt left AMA at [phone] with belongings. MD notified.

## 2020-06-07 NOTE — Progress Notes (Signed)
Spoke with patient about wearing CPAP tonight.  Patient states he can't breathe with CPAP on at home his settings are not correct etc.  I advised we could try ours here on a auto titrate setting and he may could adjust to it better.  Patient continues to refuse.  RN aware.

## 2020-06-07 NOTE — Telephone Encounter (Addendum)
Received notification from cardiac rehab that patient seen yesterday by our team for NSTEMI with subsequent stenting and low EF by cath left AMA early this morning. Internal medicine did discharge summary. Do not see any cardiac scripts sent in. I discussed with Dr. Pemberton who met patient yesterday. Per our discussion, please call the patient with the following plan:  - Find out what pharmacy he uses and please send in aspirin 81mg daily and Brilinta 90mg BID to start ASAP - please tell patient he must pick up THIS MORNING. Please call pharmacy this afternoon to confirm he picked up his meds. (If there is any issue with cost he needs to let us know immediately and Dr. Pemberton has given permission to load with Plavix 300mg x1 on day 1 then 75mg daily thereafter. He has Medicaid per chart so hopefully should be a non-issue.)   - Please reiterate to patient that he is at high risk of death if he does not take these medicines as stent could clot off and he could have a fatal heart attack- Should have low threshold to return to ER by EMS for any symptoms whatsoever - chest pain, shortness of breath, leg swelling, dizziness and so forth.  - He is not to drive, lift over 5-10lbs, return to work or participate in sexual activity until cleared by his cardiologist in follow-up. Make sure he knows to keep cath site clean & dry and if he notices increased pain, swelling, bleeding or pus. No baths or hot tubs for now  - Please arrange follow-up appointment this week in clinic - totally appropriate to take DOD slot if needed. Bring all meds to appt  Thank you!    

## 2020-06-07 NOTE — Telephone Encounter (Signed)
Left message to call back  

## 2020-06-07 NOTE — Progress Notes (Signed)
HOSPITAL MEDICINE OVERNIGHT  PROGRESS NOTE  Patient lying in bed, complaining of shortness of breath but now denying chest pain.  Patient underwent cardiac catheterization earlier in the evening without complication status post 2 drug-eluting stents.  Patient is awake alert and oriented x3.  Lung examination is relatively clear.  Heart sounds are regular.  Chemistry post cath has been reviewed.  Patient is still exhibiting extremely mild diabetic ketoacidosis but anion gap is only 13 with bicarbonate of 17.  Hemoglobin A1c found to be 12.8%.  Will attempt to manage this extremely mild DKA by initiation of basal bolus insulin regimen.  We will start with Lantus 15 units nightly and 5 units before every meal.  We will obtain repeat chemistry in several hours and if gap widens we will consider initiation of insulin infusion.  Finally, concerning patient's shortness of breath, review of cardiac catheterization findings reveals the patient has estimated ejection fraction of 25 to 30%.  Patient may now be suffering from some element of acute pulmonary edema after fluids already administered during this hospitalization.  Will obtain stat chest x-ray to evaluate.  Matthew Elk  MD [facility]

## 2020-06-08 ENCOUNTER — Ambulatory Visit: Payer: Medicaid Other | Admitting: Physician Assistant

## 2020-06-08 ENCOUNTER — Encounter: Payer: Self-pay | Admitting: Physician Assistant

## 2020-06-08 ENCOUNTER — Other Ambulatory Visit: Payer: Self-pay

## 2020-06-08 VITALS — BP 130/90 | HR 123 | Ht 74.0 in | Wt 254.2 lb

## 2020-06-08 DIAGNOSIS — E669 Obesity, unspecified: Secondary | ICD-10-CM

## 2020-06-08 DIAGNOSIS — I2510 Atherosclerotic heart disease of native coronary artery without angina pectoris: Secondary | ICD-10-CM

## 2020-06-08 DIAGNOSIS — E1165 Type 2 diabetes mellitus with hyperglycemia: Secondary | ICD-10-CM

## 2020-06-08 DIAGNOSIS — I10 Essential (primary) hypertension: Secondary | ICD-10-CM

## 2020-06-08 DIAGNOSIS — F411 Generalized anxiety disorder: Secondary | ICD-10-CM

## 2020-06-08 DIAGNOSIS — G4733 Obstructive sleep apnea (adult) (pediatric): Secondary | ICD-10-CM

## 2020-06-08 MED ORDER — LISINOPRIL 20 MG PO TABS: 20.0000 mg | ORAL_TABLET | Freq: Every day | ORAL | 3 refills | Status: DC

## 2020-06-08 MED ORDER — ATENOLOL 100 MG PO TABS: 100.0000 mg | ORAL_TABLET | Freq: Every day | ORAL | 3 refills | Status: DC

## 2020-06-08 NOTE — Progress Notes (Signed)
Cardiology Office Note    Date:  06/08/2020   ID:  Matthew Bernicholas J Tenny, DOB May 12, 1976, MRN [id number]  PCP:  Leilani Ableeese, Betti, MD  Cardiologist: Meriam SpragueHeather E Pemberton, MD EPS: None  Chief Complaint  Patient presents with  . Hospitalization Follow-up    History of Present Illness:  Matthew [name] is a 45 y.o. male with a hx of HTN, HLD, pre-DM, celiac dz, schizoaffective d/o, anxiety, ADHD, OSA not on CPAP.  Patient admitted with NSTEMI 06/06/20 treated with DES pLAD and DES dRCA, and severe LVEF 25-30%. [facility] He was called and urged to start Brilinta and added onto my schedule. Patient comes in for f/u. Short of breath. Eating a lot of fast food. HR 123/m. Hasn't filled atenolol, lisinopril.     Past Medical History:  Diagnosis Date  . Anxiety   . Celiac disease   . Dairy allergy   . Depression   . High cholesterol   . Hypertension   . Paranoia (HCC)   . Pre-diabetes   . Schizoaffective disorder [facility](HCC)     Past Surgical History:  Procedure Laterality Date  . APPENDECTOMY    . BRAIN SURGERY    . CORONARY STENT INTERVENTION N/A 06/06/2020   Procedure: CORONARY STENT INTERVENTION;  Surgeon: SwazilandJordan, Peter M, MD;  [facility];  Service: Cardiovascular;  Laterality: N/A;  prox LAD, distal RCA  . INTRAVASCULAR ULTRASOUND/IVUS N/A 06/06/2020   Procedure: Intravascular Ultrasound/IVUS;  Surgeon: SwazilandJordan, Peter M, MD;  [facility];  Service: Cardiovascular;  Laterality: N/A;  . LEFT HEART CATH AND CORONARY ANGIOGRAPHY N/A 06/06/2020   Procedure: LEFT HEART CATH AND CORONARY ANGIOGRAPHY;  Surgeon: SwazilandJordan, Peter M, MD;  [facility];  Service: Cardiovascular;  Laterality: N/A;  . NASAL SEPTUM SURGERY    . WRIST FRACTURE SURGERY     right wrist    Current Medications: Current Meds  Medication Sig  . amphetamine-dextroamphetamine (ADDERALL) 30 MG tablet Take 1 tablet by mouth 2 (two) times daily.  Melene Muller. [START ON  06/19/2020] amphetamine-dextroamphetamine (ADDERALL) 30 MG tablet Take 1 tablet by mouth 2 (two) times daily.  Marland Kitchen. amphetamine-dextroamphetamine (ADDERALL) 30 MG tablet Take 1 tablet by mouth 2 (two) times daily.  Marland Kitchen. aspirin EC 81 MG tablet Take 1 tablet (81 mg total) by mouth daily. Swallow whole.  Marland Kitchen. atorvastatin (LIPITOR) 20 MG tablet Take 20 mg by mouth daily.  . clonazePAM (KLONOPIN) 1 MG tablet I po tid X 4 days, then 1 po bid X 4 days, then 1 po daily for 4 days, then 1/2 po daily for 4 days then stop  . clonazePAM (KLONOPIN) 2 MG tablet Take 1 tablet (2 mg total) by mouth 2 (two) times daily as needed for anxiety.  . DULoxetine (CYMBALTA) 60 MG capsule TAKE 1 CAPSULE BY MOUTH 2 TIMES DAILY. (Patient taking differently: Take 60 mg by mouth 2 (two) times daily.)  . fluticasone (FLONASE) 50 MCG/ACT nasal spray Place 1 spray into both nostrils as needed for allergies or rhinitis.  Marland Kitchen. gabapentin (NEURONTIN) 800 MG tablet TAKE 1 TABLET BY MOUTH THREE TIMES A DAY (Patient taking differently: Take 800 mg by mouth 3 (three) times daily.)  . lithium carbonate (LITHOBID) 300 MG CR tablet TAKE 2 TABLETS (600 MG TOTAL) BY MOUTH AT BEDTIME.  . nicotine polacrilex (NICORETTE) 2 MG gum Take 1 each (2 mg total) by mouth as needed for smoking cessation.  Marland Kitchen. perphenazine (TRILAFON) 16 MG tablet  Take 1 pill total 16 mg every morning and take 2 pills by mouth total 32 mg every bedtime (Patient taking differently: Take 16-32 mg by mouth See admin instructions. Take 1 pill total 16 mg every morning and take 2 pills by mouth total 32 mg every bedtime)  . ticagrelor (BRILINTA) 90 MG TABS tablet Take 1 tablet (90 mg total) by mouth 2 (two) times daily.  . traZODone (DESYREL) 100 MG tablet TAKE 3 TABLETS BY MOUTH EVERY DAY AT BEDTIME AS NEEDED FOR SLEEP (Patient taking differently: Take 300 mg by mouth at bedtime as needed for sleep. TAKE 3 TABLETS BY MOUTH EVERY DAY AT BEDTIME AS NEEDED FOR SLEEP)  . VICTOZA 18 MG/3ML SOPN  Inject 0.6 mg into the skin daily.  Marland Kitchen. XIGDUO XR 02-999 MG TB24 Take 1 tablet by mouth daily.  . [DISCONTINUED] atenolol (TENORMIN) 100 MG tablet TAKE 1 TABLET (100 MG TOTAL) BY MOUTH DAILY AFTER BREAKFAST. (Patient taking differently: Take 100 mg by mouth every evening.)  . [DISCONTINUED] lisinopril (ZESTRIL) 20 MG tablet Take 20 mg by mouth daily.     Allergies:   Gluten meal   Social History   Socioeconomic History  . Marital status: Single    Spouse name: Not on file  . Number of children: 0  . Years of education: Not on file  . Highest education level: Not on file  Occupational History  . Occupation: [employer], unemployed  Tobacco Use  . Smoking status: Current Every Day Smoker    Packs/day: 1.00    Types: Cigarettes  . Smokeless tobacco: Never Used  Vaping Use  . Vaping Use: Never used  Substance and Sexual Activity  . Alcohol use: No    Alcohol/week: 0.0 standard drinks    Comment: no (02/08/15)  . Drug use: No  . Sexual activity: Not on file  Other Topics Concern  . Not on file  Social History Narrative   [facility]   Social Determinants of Health   Financial Resource Strain: Not on file  Food Insecurity: Not on file  Transportation Needs: Not on file  Physical Activity: Not on file  Stress: Not on file  Social Connections: Not on file     Family History:  The patient's family history includes Asthma in his mother; Hypertension in his father and mother.   ROS:   Please see the history of present illness.    ROS All other systems reviewed and are negative.   PHYSICAL EXAM:   VS:  BP 130/90   Pulse (!) 123   Ht 6\' 2"  (1.88 m)   Wt 254 lb 3.2 oz (115.3 kg)   SpO2 95%   BMI 32.64 kg/m   Physical Exam  GEN: Obese, in no acute distress  Neck: no JVD, carotid bruits, or masses Cardiac:RRR; no murmurs, rubs, or gallops  Respiratory:  clear to auscultation bilaterally, normal work of breathing GI: soft, nontender, nondistended, +  BS Ext: without cyanosis, clubbing, or edema, Good distal pulses bilaterally Neuro:  Alert and Oriented x 3 Psych: Flat affect  Wt Readings from Last 3 Encounters:  06/08/20 254 lb 3.2 oz (115.3 kg)  06/06/20 270 lb (122.5 kg)  04/19/20 275 lb (124.7 kg)      Studies/Labs Reviewed:   EKG:  EKG is  ordered today.  The ekg ordered today demonstrates sinus tachycardia at 123 bpm with inferior lateral ST-T wave changes in inferior Q waves consistent with recent MI  Recent  Labs: 06/06/2020: TSH 2.644 06/07/2020: B Natriuretic Peptide 151.1; BUN 11; Creatinine, Ser 1.17; Hemoglobin 15.7; Magnesium 1.9; Platelets 171; Potassium 4.3; Sodium 133   Lipid Panel    Component Value Date/Time   CHOL 717 (H) 06/06/2020 2032   TRIG 2,311 (H) 06/06/2020 2032   HDL NOT REPORTED DUE TO HIGH TRIGLYCERIDES 06/06/2020 2032   CHOLHDL NOT REPORTED DUE TO HIGH TRIGLYCERIDES 06/06/2020 2032   VLDL UNABLE TO CALCULATE IF TRIGLYCERIDE OVER 400 mg/dL 76/28/3151 7616   LDLCALC UNABLE TO CALCULATE IF TRIGLYCERIDE OVER 400 mg/dL 07/37/1062 6948   LDLDIRECT 51.0 06/06/2020 2032    Additional studies/ records that were reviewed today include:  Left Heart Cath 06/06/2020:   1. Severe 2 vessel obstructive CAD    - 99% proximal LAD    - 90% distal RCA 2. Severe LV dysfunction. EF estimated at 25-30%. Segmental wall motion abnormalities. 3. Moderately elevated LVEDP 4. Successful PCI of the proximal LAD with IVUS guidance and DES x 1 5. Successful PCI of the distal RCA with IVUS guidance and DES x 1          Risk Assessment/Calculations:         ASSESSMENT:    1. Coronary artery disease involving native coronary artery of native heart without angina pectoris   2. Essential hypertension   3. Type 2 diabetes mellitus with hyperglycemia, without long-term current use of insulin (HCC)   4. Obesity (BMI 30.0-34.9)   5. OSA (obstructive sleep apnea)   6. Generalized anxiety disorder      PLAN:  In  order of problems listed above:  NSTEMI S/P DES pLAD and dRCA 06/06/20 with severe LVEF 25-30% and mod elevation LVEDP. Patient [facility] 06/[facility].  Heart rate 123 bpm today blood pressure up.  Has not refilled his atenolol or lisinopril.  Stressed importance of taking his medications on a regular basis.  He did fill his Brilinta and atorvastatin and says he is taking them properly.  Also taking aspirin once daily.  Follow-up in 2 weeks  Ischemic cardiomyopathy ejection fraction 25 to 30% on cath.  No heart failure on exam.  Suspect shortness of breath is due to sinus tachycardia. he signed out AMA.  No echo was done.  Need to get him back on his meds.  Hopefully he will follow-up and be compliant with medications.  HTN blood pressure elevated.  Stressed the importance of taking lisinopril and atenolol  DM2 Per PCP not taking his Xigduo XR.  Stressed importance of taking his medications and overall health  Obesity recommended cardiac rehab.  Patient is not sure he wants to do this.  OSA not on CPAP previously seen by Dr. Maple Hudson.  Encouraged follow-up  Tobacco abuse smoking 1 pack of cigarettes daily.  Stressed importance of smoking cessation.  Using Nicorette gum  Shared Decision Making/Informed Consent        Medication Adjustments/Labs and Tests Ordered: Current medicines are reviewed at length with the patient today.  Concerns regarding medicines are outlined above.  Medication changes, Labs and Tests ordered today are listed in the Patient Instructions below. Patient Instructions   Medication Instructions:  Your physician recommends that you continue on your current medications as directed. Please refer to the Current Medication list given to you today.  *If you need a refill on your cardiac medications before your next appointment, please call your pharmacy*   Lab Work: None If you have labs (blood work) drawn today and your tests are completely normal, you  will receive your results  only by: Marland Kitchen MyChart Message (if you have MyChart) OR . A paper copy in the mail If you have any lab test that is abnormal or we need to change your treatment, we will call you to review the results.   Follow-Up: At [facility], you and your health needs are our priority.  As part of our continuing mission to provide you with exceptional heart care, we have created designated Provider Care Teams.  These Care Teams include your primary Cardiologist (physician) and Advanced Practice Providers (APPs -  Physician Assistants and Nurse Practitioners) who all work together to provide you with the care you need, when you need it.  We recommend signing up for the patient portal called "MyChart".  Sign up information is provided on this After Visit Summary.  MyChart is used to connect with patients for Virtual Visits (Telemedicine).  Patients are able to view lab/test results, encounter notes, upcoming appointments, etc.  Non-urgent messages can be sent to your provider as well.   To learn more about what you can do with MyChart, go to [email or web address].    Your next appointment:   06/22/2020  The format for your next appointment:   In Person  Provider:   Laurance Flatten, MD   Other Instructions Make sure you pick up all of your medications from your local pharmacy.   Two Gram Sodium Diet 2000 mg  What is Sodium? Sodium is a mineral found naturally in many foods. The most significant source of sodium in the diet is table salt, which is about 40% sodium.  Processed, convenience, and preserved foods also contain a large amount of sodium.  The body needs only 500 mg of sodium daily to function,  A normal diet provides more than enough sodium even if you do not use salt.  Why Limit Sodium? A build up of sodium in the body can cause thirst, increased blood pressure, shortness of breath, and water retention.  Decreasing sodium in the diet can reduce edema and risk of heart attack or stroke  associated with high blood pressure.  Keep in mind that there are many other factors involved in these health problems.  Heredity, obesity, lack of exercise, cigarette smoking, stress and what you eat all play a role.  General Guidelines:  Do not add salt at the table or in cooking.  One teaspoon of salt contains over 2 grams of sodium.  Read food labels  Avoid processed and convenience foods  Ask your dietitian before eating any foods not dicussed in the menu planning guidelines  Consult your physician if you wish to use a salt substitute or a sodium containing medication such as antacids.  Limit milk and milk products to 16 oz (2 cups) per day.  Shopping Hints:  READ LABELS!! "Dietetic" does not necessarily mean low sodium.  Salt and other sodium ingredients are often added to foods during processing.   Menu Planning Guidelines Food Group Choose More Often Avoid  Beverages (see also the milk group All fruit juices, low-sodium, salt-free vegetables juices, low-sodium carbonated beverages Regular vegetable or tomato juices, commercially softened water used for drinking or cooking  Breads and Cereals Enriched white, wheat, rye and pumpernickel bread, hard rolls and dinner rolls; muffins, cornbread and waffles; most dry cereals, cooked cereal without added salt; unsalted crackers and breadsticks; low sodium or homemade bread crumbs Bread, rolls and crackers with salted tops; quick breads; instant hot cereals; pancakes; commercial bread stuffing; self-rising flower and biscuit  mixes; regular bread crumbs or cracker crumbs  Desserts and Sweets Desserts and sweets mad with mild should be within allowance Instant pudding mixes and cake mixes  Fats Butter or margarine; vegetable oils; unsalted salad dressings, regular salad dressings limited to 1 Tbs; light, sour and heavy cream Regular salad dressings containing bacon fat, bacon bits, and salt pork; snack dips made with instant soup mixes or  processed cheese; salted nuts  Fruits Most fresh, frozen and canned fruits Fruits processed with salt or sodium-containing ingredient (some dried fruits are processed with sodium sulfites        Vegetables Fresh, frozen vegetables and low- sodium canned vegetables Regular canned vegetables, sauerkraut, pickled vegetables, and others prepared in brine; frozen vegetables in sauces; vegetables seasoned with ham, bacon or salt pork  Condiments, Sauces, Miscellaneous  Salt substitute with physician's approval; pepper, herbs, spices; vinegar, lemon or lime juice; hot pepper sauce; garlic powder, onion powder, low sodium soy sauce (1 Tbs.); low sodium condiments (ketchup, chili sauce, mustard) in limited amounts (1 tsp.) fresh ground horseradish; unsalted tortilla chips, pretzels, potato chips, popcorn, salsa (1/4 cup) Any seasoning made with salt including garlic salt, celery salt, onion salt, and seasoned salt; sea salt, rock salt, kosher salt; meat tenderizers; monosodium glutamate; mustard, regular soy sauce, barbecue, sauce, chili sauce, teriyaki sauce, steak sauce, Worcestershire sauce, and most flavored vinegars; canned gravy and mixes; regular condiments; salted snack foods, olives, picles, relish, horseradish sauce, catsup   Food preparation: Try these seasonings Meats:    Pork Sage, onion Serve with applesauce  Chicken Poultry seasoning, thyme, parsley Serve with cranberry sauce  Lamb Curry powder, rosemary, garlic, thyme Serve with mint sauce or jelly  Veal Marjoram, basil Serve with current jelly, cranberry sauce  Beef Pepper, bay leaf Serve with dry mustard, unsalted chive butter  Fish Bay leaf, dill Serve with unsalted lemon butter, unsalted parsley butter  Vegetables:    Asparagus Lemon juice   Broccoli Lemon juice   Carrots Mustard dressing parsley, mint, nutmeg, glazed with unsalted butter and sugar   Green beans Marjoram, lemon juice, nutmeg,dill seed   Tomatoes Basil, marjoram,  onion   Spice /blend for [employer]" 4 tsp ground thyme 1 tsp ground sage 3 tsp ground rosemary 4 tsp ground marjoram   Test your knowledge 1. A product that says "Salt Free" may still contain sodium. True or False 2. Garlic Powder and Hot Pepper Sauce an be used as alternative seasonings.True or False 3. Processed foods have more sodium than fresh foods.  True or False 4. Canned Vegetables have less sodium than froze True or False  WAYS TO DECREASE YOUR SODIUM INTAKE 1. Avoid the use of added salt in cooking and at the table.  Table salt (and other prepared seasonings which contain salt) is probably one of the greatest sources of sodium in the diet.  Unsalted foods can gain flavor from the sweet, sour, and butter taste sensations of herbs and spices.  Instead of using salt for seasoning, try the following seasonings with the foods listed.  Remember: how you use them to enhance natural food flavors is limited only by your creativity... Allspice-Meat, fish, eggs, fruit, peas, red and yellow vegetables Almond Extract-Fruit baked goods Anise Seed-Sweet breads, fruit, carrots, beets, cottage cheese, cookies (tastes like licorice) Basil-Meat, fish, eggs, vegetables, rice, vegetables salads, soups, sauces Bay Leaf-Meat, fish, stews, poultry Burnet-Salad, vegetables (cucumber-like flavor) Caraway Seed-Bread, cookies, cottage cheese, meat, vegetables, cheese, rice Cardamon-Baked goods, fruit, soups Celery Powder or seed-Salads,  salad dressings, sauces, meatloaf, soup, bread.Do not use  celery salt Chervil-Meats, salads, fish, eggs, vegetables, cottage cheese (parsley-like flavor) Chili Power-Meatloaf, chicken cheese, corn, eggplant, egg dishes Chives-Salads cottage cheese, egg dishes, soups, vegetables, sauces Cilantro-Salsa, casseroles Cinnamon-Baked goods, fruit, pork, lamb, chicken, carrots Cloves-Fruit, baked goods, fish, pot roast, green beans, beets, carrots Coriander-Pastry, cookies,  meat, salads, cheese (lemon-orange flavor) Cumin-Meatloaf, fish,cheese, eggs, cabbage,fruit pie (caraway flavor) [employer], fruit, eggs, fish, poultry, cottage cheese, vegetables Dill Seed-Meat, cottage cheese, poultry, vegetables, fish, salads, bread Fennel Seed-Bread, cookies, apples, pork, eggs, fish, beets, cabbage, cheese, Licorice-like flavor Garlic-(buds or powder) Salads, meat, poultry, fish, bread, butter, vegetables, potatoes.Do not  use garlic salt Ginger-Fruit, vegetables, baked goods, meat, fish, poultry Horseradish Root-Meet, vegetables, butter Lemon Juice or Extract-Vegetables, fruit, tea, baked goods, fish salads Mace-Baked goods fruit, vegetables, fish, poultry (taste like nutmeg) Maple Extract-Syrups Marjoram-Meat, chicken, fish, vegetables, breads, green salads (taste like Sage) Mint-Tea, lamb, sherbet, vegetables, desserts, carrots, cabbage Mustard, Dry or Seed-Cheese, eggs, meats, vegetables, poultry Nutmeg-Baked goods, fruit, chicken, eggs, vegetables, desserts Onion Powder-Meat, fish, poultry, vegetables, cheese, eggs, bread, rice salads (Do not use   Onion salt) Orange Extract-Desserts, baked goods Oregano-Pasta, eggs, cheese, onions, pork, lamb, fish, chicken, vegetables, green salads Paprika-Meat, fish, poultry, eggs, cheese, vegetables Parsley Flakes-Butter, vegetables, meat fish, poultry, eggs, bread, salads (certain forms may   Contain sodium Pepper-Meat fish, poultry, vegetables, eggs Peppermint Extract-Desserts, baked goods Poppy Seed-Eggs, bread, cheese, fruit dressings, baked goods, noodles, vegetables, cottage  [employer], poultry, meat, fish, cauliflower, turnips,eggs bread Saffron-Rice, bread, veal, chicken, fish, eggs Sage-Meat, fish, poultry, onions, eggplant, tomateos, pork, stews Savory-Eggs, salads, poultry, meat, rice, vegetables, soups, pork Tarragon-Meat, poultry, fish, eggs, butter, vegetables  (licorice-like flavor)  Thyme-Meat, poultry, fish, eggs, vegetables, (clover-like flavor), sauces, soups Tumeric-Salads, butter, eggs, fish, rice, vegetables (saffron-like flavor) Vanilla Extract-Baked goods, candy Vinegar-Salads, vegetables, meat marinades Walnut Extract-baked goods, candy  2. Choose your Foods Wisely   The following is a list of foods to avoid which are high in sodium:  Meats-Avoid all smoked, canned, salt cured, dried and kosher meat and fish as well as Anchovies   [employer] meats:Bologna, Liverwurst, Pastrami Canned meat or fish  Marinated herring Caviar    Pepperoni Corned Beef   Pizza Dried chipped beef  Salami Frozen breaded fish or meat Salt pork Frankfurters or hot dogs  Sardines Gefilte fish   Sausage Ham (boiled ham, Proscuitto Smoked butt    spiced ham)   Spam      TV Dinners Vegetables Canned vegetables (Regular) Relish Canned mushrooms  Sauerkraut Olives    Tomato juice Pickles  Bakery and Dessert Products Canned puddings  Cream pies Cheesecake   Decorated cakes Cookies  Beverages/Juices Tomato juice, regular  Gatorade   V-8 vegetable juice, regular  Breads and Cereals Biscuit mixes   Salted potato chips, corn chips, pretzels Bread stuffing mixes  Salted crackers and rolls Pancake and waffle mixes Self-rising flour  Seasonings Accent    Meat sauces Barbecue sauce  Meat tenderizer Catsup    Monosodium glutamate (MSG) Celery salt   Onion salt Chili sauce   Prepared mustard Garlic salt   Salt, seasoned salt, sea salt Gravy mixes   Soy sauce Horseradish   Steak sauce Ketchup   Tartar sauce Lite salt    Teriyaki sauce Marinade mixes   Worcestershire sauce  Others Baking powder   Cocoa and cocoa mixes Baking soda   Commercial casserole mixes Candy-caramels, chocolate  Dehydrated soups    Bars, fudge,nougats  Instant rice and pasta mixes Canned broth or soup  Maraschino cherries Cheese, aged and processed cheese and  cheese spreads  Learning Assessment Quiz  Indicated T (for True) or F (for False) for each of the following statements:  1. _____ Fresh fruits and vegetables and unprocessed grains are generally low in sodium 2. _____ Water may contain a considerable amount of sodium, depending on the source 3. _____ You can always tell if a food is high in sodium by tasting it 4. _____ Certain laxatives my be high in sodium and should be avoided unless prescribed   by a physician or pharmacist 5. _____ Salt substitutes may be used freely by anyone on a sodium restricted diet 6. _____ Sodium is present in table salt, food additives and as a natural component of   most foods 7. _____ Table salt is approximately 90% sodium 8. _____ Limiting sodium intake may help prevent excess fluid accumulation in the body 9. _____ On a sodium-restricted diet, seasonings such as bouillon soy sauce, and    cooking wine should be used in place of table salt 10. _____ On an ingredient list, a product which lists monosodium glutamate as the first   ingredient is an appropriate food to include on a low sodium diet  Circle the best answer(s) to the following statements (Hint: there may be more than one correct answer)  11. On a low-sodium diet, some acceptable snack items are:    A. Olives  F. Bean dip   K. Grapefruit juice    B. Salted Pretzels G. Commercial Popcorn   L. Canned peaches    C. Carrot Sticks  H. Bouillon   M. Unsalted nuts   D. [place] fries  I. Peanut butter crackers N. Salami   E. Sweet pickles J. Tomato Juice   O. Pizza  12.  Seasonings that may be used freely on a reduced - sodium diet include   A. Lemon wedges F.Monosodium glutamate K. Celery seed    B.Soysauce   G. Pepper   L. Mustard powder   C. Sea salt  H. Cooking wine  M. Onion flakes   D. Vinegar  E. Prepared horseradish N. Salsa   E. Sage   J. Worcestershire sauce  O. [address]     Elson Clan, PA-C  06/08/2020 11:55 AM     [facility] [address] [place], [place], [place]  [address] Phone: [phone]; Fax: [phone]

## 2020-06-08 NOTE — Patient Instructions (Addendum)
Medication Instructions:  Your physician recommends that you continue on your current medications as directed. Please refer to the Current Medication list given to you today.  *If you need a refill on your cardiac medications before your next appointment, please call your pharmacy*   Lab Work: None If you have labs (blood work) drawn today and your tests are completely normal, you will receive your results only by: Marland Kitchen MyChart Message (if you have MyChart) OR . A paper copy in the mail If you have any lab test that is abnormal or we need to change your treatment, we will call you to review the results.   Follow-Up: At [facility], you and your health needs are our priority.  As part of our continuing mission to provide you with exceptional heart care, we have created designated Provider Care Teams.  These Care Teams include your primary Cardiologist (physician) and Advanced Practice Providers (APPs -  Physician Assistants and Nurse Practitioners) who all work together to provide you with the care you need, when you need it.  We recommend signing up for the patient portal called "MyChart".  Sign up information is provided on this After Visit Summary.  MyChart is used to connect with patients for Virtual Visits (Telemedicine).  Patients are able to view lab/test results, encounter notes, upcoming appointments, etc.  Non-urgent messages can be sent to your provider as well.   To learn more about what you can do with MyChart, go to [email or web address].    Your next appointment:   06/22/2020  The format for your next appointment:   In Person  Provider:   Laurance Flatten, MD   Other Instructions Make sure you pick up all of your medications from your local pharmacy.   Two Gram Sodium Diet 2000 mg  What is Sodium? Sodium is a mineral found naturally in many foods. The most significant source of sodium in the diet is table salt, which is about 40% sodium.  Processed, convenience, and  preserved foods also contain a large amount of sodium.  The body needs only 500 mg of sodium daily to function,  A normal diet provides more than enough sodium even if you do not use salt.  Why Limit Sodium? A build up of sodium in the body can cause thirst, increased blood pressure, shortness of breath, and water retention.  Decreasing sodium in the diet can reduce edema and risk of heart attack or stroke associated with high blood pressure.  Keep in mind that there are many other factors involved in these health problems.  Heredity, obesity, lack of exercise, cigarette smoking, stress and what you eat all play a role.  General Guidelines:  Do not add salt at the table or in cooking.  One teaspoon of salt contains over 2 grams of sodium.  Read food labels  Avoid processed and convenience foods  Ask your dietitian before eating any foods not dicussed in the menu planning guidelines  Consult your physician if you wish to use a salt substitute or a sodium containing medication such as antacids.  Limit milk and milk products to 16 oz (2 cups) per day.  Shopping Hints:  READ LABELS!! "Dietetic" does not necessarily mean low sodium.  Salt and other sodium ingredients are often added to foods during processing.   Menu Planning Guidelines Food Group Choose More Often Avoid  Beverages (see also the milk group All fruit juices, low-sodium, salt-free vegetables juices, low-sodium carbonated beverages Regular vegetable or tomato juices, commercially softened water  used for drinking or cooking  Breads and Cereals Enriched white, wheat, rye and pumpernickel bread, hard rolls and dinner rolls; muffins, cornbread and waffles; most dry cereals, cooked cereal without added salt; unsalted crackers and breadsticks; low sodium or homemade bread crumbs Bread, rolls and crackers with salted tops; quick breads; instant hot cereals; pancakes; commercial bread stuffing; self-rising flower and biscuit mixes; regular  bread crumbs or cracker crumbs  Desserts and Sweets Desserts and sweets mad with mild should be within allowance Instant pudding mixes and cake mixes  Fats Butter or margarine; vegetable oils; unsalted salad dressings, regular salad dressings limited to 1 Tbs; light, sour and heavy cream Regular salad dressings containing bacon fat, bacon bits, and salt pork; snack dips made with instant soup mixes or processed cheese; salted nuts  Fruits Most fresh, frozen and canned fruits Fruits processed with salt or sodium-containing ingredient (some dried fruits are processed with sodium sulfites        Vegetables Fresh, frozen vegetables and low- sodium canned vegetables Regular canned vegetables, sauerkraut, pickled vegetables, and others prepared in brine; frozen vegetables in sauces; vegetables seasoned with ham, bacon or salt pork  Condiments, Sauces, Miscellaneous  Salt substitute with physician's approval; pepper, herbs, spices; vinegar, lemon or lime juice; hot pepper sauce; garlic powder, onion powder, low sodium soy sauce (1 Tbs.); low sodium condiments (ketchup, chili sauce, mustard) in limited amounts (1 tsp.) fresh ground horseradish; unsalted tortilla chips, pretzels, potato chips, popcorn, salsa (1/4 cup) Any seasoning made with salt including garlic salt, celery salt, onion salt, and seasoned salt; sea salt, rock salt, kosher salt; meat tenderizers; monosodium glutamate; mustard, regular soy sauce, barbecue, sauce, chili sauce, teriyaki sauce, steak sauce, Worcestershire sauce, and most flavored vinegars; canned gravy and mixes; regular condiments; salted snack foods, olives, picles, relish, horseradish sauce, catsup   Food preparation: Try these seasonings Meats:    Pork Sage, onion Serve with applesauce  Chicken Poultry seasoning, thyme, parsley Serve with cranberry sauce  Lamb Curry powder, rosemary, garlic, thyme Serve with mint sauce or jelly  Veal Marjoram, basil Serve with current  jelly, cranberry sauce  Beef Pepper, bay leaf Serve with dry mustard, unsalted chive butter  [place] leaf, dill Serve with unsalted lemon butter, unsalted parsley butter  Vegetables:    Asparagus Lemon juice   Broccoli Lemon juice   Carrots Mustard dressing parsley, mint, nutmeg, glazed with unsalted butter and sugar   Green beans Marjoram, lemon juice, nutmeg,dill seed   Tomatoes Basil, marjoram, onion   Spice /blend for [employer]" 4 tsp ground thyme 1 tsp ground sage 3 tsp ground rosemary 4 tsp ground marjoram   Test your knowledge 1. A product that says "Salt Free" may still contain sodium. True or False 2. Garlic Powder and Hot Pepper Sauce an be used as alternative seasonings.True or False 3. Processed foods have more sodium than fresh foods.  True or False 4. Canned Vegetables have less sodium than froze True or False  WAYS TO DECREASE YOUR SODIUM INTAKE 1. Avoid the use of added salt in cooking and at the table.  Table salt (and other prepared seasonings which contain salt) is probably one of the greatest sources of sodium in the diet.  Unsalted foods can gain flavor from the sweet, sour, and butter taste sensations of herbs and spices.  Instead of using salt for seasoning, try the following seasonings with the foods listed.  Remember: how you use them to enhance natural food flavors is limited only  by your creativity... Allspice-Meat, fish, eggs, fruit, peas, red and yellow vegetables Almond Extract-Fruit baked goods Anise Seed-Sweet breads, fruit, carrots, beets, cottage cheese, cookies (tastes like licorice) Basil-Meat, fish, eggs, vegetables, rice, vegetables salads, soups, sauces Bay Leaf-Meat, fish, stews, poultry Burnet-Salad, vegetables (cucumber-like flavor) Caraway Seed-Bread, cookies, cottage cheese, meat, vegetables, cheese, rice Cardamon-Baked goods, fruit, soups Celery Powder or seed-Salads, salad dressings, sauces, meatloaf, soup, bread.Do not use  celery  salt Chervil-Meats, salads, fish, eggs, vegetables, cottage cheese (parsley-like flavor) Chili Power-Meatloaf, chicken cheese, corn, eggplant, egg dishes Chives-Salads cottage cheese, egg dishes, soups, vegetables, sauces Cilantro-Salsa, casseroles Cinnamon-Baked goods, fruit, pork, lamb, chicken, carrots Cloves-Fruit, baked goods, fish, pot roast, green beans, beets, carrots Coriander-Pastry, cookies, meat, salads, cheese (lemon-orange flavor) Cumin-Meatloaf, fish,cheese, eggs, cabbage,fruit pie (caraway flavor) [employer], fruit, eggs, fish, poultry, cottage cheese, vegetables Dill Seed-Meat, cottage cheese, poultry, vegetables, fish, salads, bread Fennel Seed-Bread, cookies, apples, pork, eggs, fish, beets, cabbage, cheese, Licorice-like flavor Garlic-(buds or powder) Salads, meat, poultry, fish, bread, butter, vegetables, potatoes.Do not  use garlic salt Ginger-Fruit, vegetables, baked goods, meat, fish, poultry Horseradish Root-Meet, vegetables, butter Lemon Juice or Extract-Vegetables, fruit, tea, baked goods, fish salads Mace-Baked goods fruit, vegetables, fish, poultry (taste like nutmeg) Maple Extract-Syrups Marjoram-Meat, chicken, fish, vegetables, breads, green salads (taste like Sage) Mint-Tea, lamb, sherbet, vegetables, desserts, carrots, cabbage Mustard, Dry or Seed-Cheese, eggs, meats, vegetables, poultry Nutmeg-Baked goods, fruit, chicken, eggs, vegetables, desserts Onion Powder-Meat, fish, poultry, vegetables, cheese, eggs, bread, rice salads (Do not use   Onion salt) Orange Extract-Desserts, baked goods Oregano-Pasta, eggs, cheese, onions, pork, lamb, fish, chicken, vegetables, green salads Paprika-Meat, fish, poultry, eggs, cheese, vegetables Parsley Flakes-Butter, vegetables, meat fish, poultry, eggs, bread, salads (certain forms may   Contain sodium Pepper-Meat fish, poultry, vegetables, eggs Peppermint Extract-Desserts, baked goods Poppy Seed-Eggs, bread,  cheese, fruit dressings, baked goods, noodles, vegetables, cottage  [employer], poultry, meat, fish, cauliflower, turnips,eggs bread Saffron-Rice, bread, veal, chicken, fish, eggs Sage-Meat, fish, poultry, onions, eggplant, tomateos, pork, stews Savory-Eggs, salads, poultry, meat, rice, vegetables, soups, pork Tarragon-Meat, poultry, fish, eggs, butter, vegetables (licorice-like flavor)  Thyme-Meat, poultry, fish, eggs, vegetables, (clover-like flavor), sauces, soups Tumeric-Salads, butter, eggs, fish, rice, vegetables (saffron-like flavor) Vanilla Extract-Baked goods, candy Vinegar-Salads, vegetables, meat marinades Walnut Extract-baked goods, candy  2. Choose your Foods Wisely   The following is a list of foods to avoid which are high in sodium:  Meats-Avoid all smoked, canned, salt cured, dried and kosher meat and fish as well as Anchovies   [employer] meats:Bologna, Liverwurst, Pastrami Canned meat or fish  Marinated herring Caviar    Pepperoni Corned Beef   Pizza Dried chipped beef  Salami Frozen breaded fish or meat Salt pork Frankfurters or hot dogs  Sardines Gefilte fish   Sausage Ham (boiled ham, Proscuitto Smoked butt    spiced ham)   Spam      TV Dinners Vegetables Canned vegetables (Regular) Relish Canned mushrooms  Sauerkraut Olives    Tomato juice Pickles  Bakery and Dessert Products Canned puddings  Cream pies Cheesecake   Decorated cakes Cookies  Beverages/Juices Tomato juice, regular  Gatorade   V-8 vegetable juice, regular  Breads and Cereals Biscuit mixes   Salted potato chips, corn chips, pretzels Bread stuffing mixes  Salted crackers and rolls Pancake and waffle mixes Self-rising flour  Seasonings Accent    Meat sauces Barbecue sauce  Meat tenderizer Catsup    Monosodium glutamate (MSG) Celery salt   Onion salt Chili  sauce   Prepared mustard Garlic salt   Salt, seasoned salt, sea  salt Gravy mixes   Soy sauce Horseradish   Steak sauce Ketchup   Tartar sauce Lite salt    Teriyaki sauce Marinade mixes   Worcestershire sauce  Others Baking powder   Cocoa and cocoa mixes Baking soda   Commercial casserole mixes Candy-caramels, chocolate  Dehydrated soups    Bars, fudge,nougats  Instant rice and pasta mixes Canned broth or soup  Maraschino cherries Cheese, aged and processed cheese and cheese spreads  Learning Assessment Quiz  Indicated T (for True) or F (for False) for each of the following statements:  1. _____ Fresh fruits and vegetables and unprocessed grains are generally low in sodium 2. _____ Water may contain a considerable amount of sodium, depending on the source 3. _____ You can always tell if a food is high in sodium by tasting it 4. _____ Certain laxatives my be high in sodium and should be avoided unless prescribed   by a physician or pharmacist 5. _____ Salt substitutes may be used freely by anyone on a sodium restricted diet 6. _____ Sodium is present in table salt, food additives and as a natural component of   most foods 7. _____ Table salt is approximately 90% sodium 8. _____ Limiting sodium intake may help prevent excess fluid accumulation in the body 9. _____ On a sodium-restricted diet, seasonings such as bouillon soy sauce, and    cooking wine should be used in place of table salt 10. _____ On an ingredient list, a product which lists monosodium glutamate as the first   ingredient is an appropriate food to include on a low sodium diet  Circle the best answer(s) to the following statements (Hint: there may be more than one correct answer)  11. On a low-sodium diet, some acceptable snack items are:    A. Olives  F. Bean dip   K. Grapefruit juice    B. Salted Pretzels G. Commercial Popcorn   L. Canned peaches    C. Carrot Sticks  H. Bouillon   M. Unsalted nuts   D. [place] fries  I. Peanut butter crackers N. Salami   E. Sweet pickles J.  Tomato Juice   O. Pizza  12.  Seasonings that may be used freely on a reduced - sodium diet include   A. Lemon wedges F.Monosodium glutamate K. Celery seed    B.Soysauce   G. Pepper   L. Mustard powder   C. Sea salt  H. Cooking wine  M. Onion flakes   D. Vinegar  E. Prepared horseradish N. Salsa   E. Sage   J. Worcestershire sauce  O. Chutney

## 2020-06-15 ENCOUNTER — Telehealth (HOSPITAL_COMMUNITY): Payer: Self-pay | Admitting: *Deleted

## 2020-06-17 ENCOUNTER — Ambulatory Visit (INDEPENDENT_AMBULATORY_CARE_PROVIDER_SITE_OTHER): Payer: Self-pay | Admitting: Physician Assistant

## 2020-06-17 ENCOUNTER — Encounter: Payer: Self-pay | Admitting: Physician Assistant

## 2020-06-17 ENCOUNTER — Other Ambulatory Visit: Payer: Self-pay

## 2020-06-17 VITALS — BP 149/92 | HR 90

## 2020-06-17 DIAGNOSIS — F411 Generalized anxiety disorder: Secondary | ICD-10-CM

## 2020-06-17 DIAGNOSIS — F902 Attention-deficit hyperactivity disorder, combined type: Secondary | ICD-10-CM

## 2020-06-17 DIAGNOSIS — I214 Non-ST elevation (NSTEMI) myocardial infarction: Secondary | ICD-10-CM

## 2020-06-17 DIAGNOSIS — G4733 Obstructive sleep apnea (adult) (pediatric): Secondary | ICD-10-CM

## 2020-06-17 DIAGNOSIS — E1165 Type 2 diabetes mellitus with hyperglycemia: Secondary | ICD-10-CM

## 2020-06-17 DIAGNOSIS — F250 Schizoaffective disorder, bipolar type: Secondary | ICD-10-CM

## 2020-06-17 MED ORDER — AMPHETAMINE-DEXTROAMPHETAMINE 20 MG PO TABS: ORAL_TABLET | ORAL | 0 refills | Status: DC

## 2020-06-17 MED ORDER — CLONAZEPAM 1 MG PO TABS: 1.0000 mg | ORAL_TABLET | Freq: Three times a day (TID) | ORAL | 0 refills | Status: DC | PRN

## 2020-06-17 NOTE — Progress Notes (Signed)
[facility]  Patient ID: Matthew [name],  MRN: [id number]  PCP: [name], MD  Date of Evaluation: 06/17/2020 Time spent:45 minutes  Chief Complaint:  Chief Complaint    Anxiety; Insomnia; Depression      HISTORY/CURRENT STATUS: HPI transferring to my care from Dr. Beverly Milch who retired recently.  Patient states he is extremely anxious.  He had a heart attack since his last visit here and of course that caused more anxiety.  He admits to taking more Klonopin than was prescribed, for that reason.  He does have more fear that something bad is going to happen but has been told that is normal after you have had a heart attack.  A stent was placed and cardiac wise, his doctors feel that he is doing okay.  He left the hospital AMA after the stent was placed.  Other than the severe anxiety, there are no acute problems with his mental health.  He states he is able to enjoy things.  Energy and motivation are fair to good.  He sleeps pretty good.  He does isolate some because he is so anxious.  States he is not taking the gabapentin regularly because "it does not work."  He is taking the Adderall which he states helps with energy.  He denies suicidal or homicidal thoughts.  Patient denies increased energy with decreased need for sleep, no increased talkativeness, no racing thoughts, no impulsivity or risky behaviors, no increased spending, no increased libido, no grandiosity, no increased irritability or anger, no paranoia, and no hallucinations.  Denies dizziness, syncope, seizures, numbness, tingling, tremor, tics, unsteady gait, slurred speech, confusion. Denies muscle or joint pain, stiffness, or dystonia.  Individual Medical History/ Review of Systems: Changes? :Yes  See HPI  Past medications for mental health diagnoses include: Risperdal, Zyprexa, Abilify, [name], Klonopin, Gabapentin, Xanax, Valium, Ativan, Hydroxyzine, Trilafon, Atenolol, Cymbalta, Prozac, Lexapro,  Paxil, Lithium, Adderall, Ritalin  Allergies: Gluten meal  Current Medications:  Current Outpatient Medications:  .  amphetamine-dextroamphetamine (ADDERALL) 20 MG tablet, 1 po bid for 1 week, then 1 po q am for one week, then stop., Disp: 21 tablet, Rfl: 0 .  aspirin EC 81 MG tablet, Take 1 tablet (81 mg total) by mouth daily. Swallow whole., Disp: 90 tablet, Rfl: 3 .  atenolol (TENORMIN) 100 MG tablet, Take 1 tablet (100 mg total) by mouth daily after breakfast., Disp: 90 tablet, Rfl: 3 .  atorvastatin (LIPITOR) 20 MG tablet, Take 20 mg by mouth daily., Disp: , Rfl:  .  clonazePAM (KLONOPIN) 1 MG tablet, Take 1 tablet (1 mg total) by mouth 3 (three) times daily as needed for anxiety., Disp: 90 tablet, Rfl: 0 .  DULoxetine (CYMBALTA) 60 MG capsule, TAKE 1 CAPSULE BY MOUTH 2 TIMES DAILY. (Patient taking differently: Take 60 mg by mouth 2 (two) times daily.), Disp: 180 capsule, Rfl: 1 .  gabapentin (NEURONTIN) 800 MG tablet, TAKE 1 TABLET BY MOUTH THREE TIMES A DAY (Patient taking differently: Take 800 mg by mouth 3 (three) times daily.), Disp: 270 tablet, Rfl: 1 .  lisinopril (ZESTRIL) 20 MG tablet, Take 1 tablet (20 mg total) by mouth daily., Disp: 90 tablet, Rfl: 3 .  lithium carbonate (LITHOBID) 300 MG CR tablet, TAKE 2 TABLETS (600 MG TOTAL) BY MOUTH AT BEDTIME., Disp: 180 tablet, Rfl: 1 .  nicotine polacrilex (NICORETTE) 2 MG gum, Take 1 each (2 mg total) by mouth as needed for smoking cessation., Disp: 100 tablet, Rfl: 0 .  perphenazine (TRILAFON)  16 MG tablet, Take 1 pill total 16 mg every morning and take 2 pills by mouth total 32 mg every bedtime (Patient taking differently: Take 16-32 mg by mouth See admin instructions. Take 1 pill total 16 mg every morning and take 2 pills by mouth total 32 mg every bedtime), Disp: 270 tablet, Rfl: 1 .  ticagrelor (BRILINTA) 90 MG TABS tablet, Take 1 tablet (90 mg total) by mouth 2 (two) times daily., Disp: 60 tablet, Rfl: 11 .  traZODone (DESYREL) 100  MG tablet, TAKE 3 TABLETS BY MOUTH EVERY DAY AT BEDTIME AS NEEDED FOR SLEEP (Patient taking differently: Take 300 mg by mouth at bedtime as needed for sleep. TAKE 3 TABLETS BY MOUTH EVERY DAY AT BEDTIME AS NEEDED FOR SLEEP), Disp: 270 tablet, Rfl: 1 .  fluticasone (FLONASE) 50 MCG/ACT nasal spray, Place 1 spray into both nostrils as needed for allergies or rhinitis. (Patient not taking: Reported on 06/17/2020), Disp: , Rfl: 2 .  VICTOZA 18 MG/3ML SOPN, Inject 0.6 mg into the skin daily. (Patient not taking: Reported on 06/17/2020), Disp: , Rfl:  .  XIGDUO XR 02-999 MG TB24, Take 1 tablet by mouth daily. (Patient not taking: Reported on 06/17/2020), Disp: , Rfl:  Medication Side Effects: none  Family Medical/ Social History: Changes?  See HPI  MENTAL HEALTH EXAM:  Blood pressure (!) 149/92, pulse 90.There is no height or weight on file to calculate BMI.  General Appearance: Casual and Guarded  Eye Contact:  Fair  Speech:  Clear and Coherent and Normal Rate  Volume:  Normal  Mood:  Anxious  Affect:  Flat and Anxious  Thought Process:  Goal Directed and Descriptions of Associations: Intact  Orientation:  Full (Time, Place, and Person)  Thought Content: Logical   Suicidal Thoughts:  No  Homicidal Thoughts:  No  Memory:  WNL  Judgement:  Poor  Insight:  Fair  Psychomotor Activity:  Restlessness and paces the floor during the entire appt  Concentration:  Concentration: Good  Recall:  Good  Fund of Knowledge: Good  Language: Good  Assets:  Desire for Improvement  ADL's:  Intact  Cognition: WNL  Prognosis:  Fair   Most recent pertinent labs done in hospital: 06/07/2020 BMP Glucose 263, BUN 11, Creatinine 1.17 06/06/2020 Chol 717, Trig 2,311  Lithium level 0.22 TSH 2.644   DIAGNOSES:    ICD-10-CM   1. Schizoaffective disorder, bipolar type without good prognostic features (HCC)  F25.0   2. Generalized anxiety disorder  F41.1   3. Non-ST elevation (NSTEMI) myocardial infarction  (HCC)  I21.4   4. OSA (obstructive sleep apnea)  G47.33   5. Type 2 diabetes mellitus with hyperglycemia, without long-term current use of insulin (HCC)  E11.65   6. Attention deficit hyperactivity disorder (ADHD), combined type, moderate  F90.2     Receiving Psychotherapy: No    RECOMMENDATIONS:  PDMP was reviewed. I provided 45 minutes of face-to-face time during this encounter, including time spent before and after his visit, reviewing hospital records and his labs.  Also discussed with the patient the need to be compliant with his medications and the fact that he has not been taking the gabapentin as directed, which can help with the anxiety as well.  The fact that he left the hospital AMA does not bode well either.  He needs to follow instructions by his entire medical team as everyone is only trying to help him.  He verbalized understanding. [place] controlled substance registry shows Klonopin sent  in on 05/23/2020 by Dr. Marlyne Beards.  Patient admits that he has taken those more often because of his recent heart attack.  I told him we will basically "reset" and he will be able to get prescription today.  He understands that I will prescribe Klonopin 1 mg 3 times daily, and if he runs out early for any reason, I will no longer prescribe controlled substance for anxiety.  I will not prescribe a stimulant for several reasons, first of all he has had an MI recently, his blood pressure is up, and I am concerned it may be increasing the anxiety.  I will wean him off of the Adderall. Wean off Adderall 20 mg by taking 1 po bid for 1 week, then 1 po daily for one week and then stop. Continue Klonopin 1 mg, 1 po tid prn. Continue Cymbalta 60 mg, 1 po bid. Start Gabapentin 800 mg, 1 po tid ROUTINELY. Continue Lithium ER 300 mg, 2 po qhs. Continue [name] 16 mg, 1 po q am, 2 po qhs. Continue Trazodone 100 mg, 3 qhs prn sleep. Recommend counseling.  Return in 4-6 weeks.  Melony Overly, PA-C

## 2020-06-19 NOTE — Progress Notes (Signed)
Cardiology Office Note:    Date:  06/22/2020   ID:  Matthew [name], DOB 1976/02/22, MRN [id number]  PCP:  [name], MD  [facility] Cardiologist:  Meriam Sprague, MD  [facility] Electrophysiologist:  None   Referring MD: [name], MD    History of Present Illness:    Matthew [name] is a 45 y.o. male with a hx of HTN, HLD, pre-DM, celiac dz, schizoaffective d/o, anxiety, ADHD, OSAnot on CPAP, and recent NSTEMI s/p DES to pLAD and DES to dRCA who presents to clinic for follow-up.  Patient admitted with NSTEMI 06/06/20 treated with DES pLAD and DES dRCA, and severe LVEF 25-30%. Patient left AMA 06/08/19 and was seen urgently in clinic by Jacolyn Reedy. During that visit, he was more SOB and had been eating a lot of fast food and had not filled his scripts for atenolol or lisinopril. Since that time, he has filled his prescriptions and has been taking all medications as prescribed. He understands the importance of taking his medications and that stopping his ASA or brilinta could lead to a massive MI and/or death. He states that since his hospitalization, he has been feeling overall okay.  Occasionally has some palpitations with some short of breath and mild lightheadedness. Episodes occur about every other day and last a couple of minutes before resolving. Can happen at rest or with activity. No chest pain.   Able to walk 0.5 miles before needing to stop due to feeling fatigued and like his heart with racing. Has some dizziness when bending down and standing back up. Blood pressure has been well controlled. Has not been on anything for his diabetes and last A1C was 12.6%. He is amenable to see Endocrinology. He is also amenable to start cardiac rehab.  We had a long discussion today about medication compliance and the need to get his diabetes under control, eat healthy, exercise, and follow-up at his appointments. He is trying very hard and has been compliant with all follow-up  since his hospitalization.    Past Medical History:  Diagnosis Date  . Anxiety   . Celiac disease   . Dairy allergy   . Depression   . High cholesterol   . Hypertension   . Paranoia (HCC)   . Pre-diabetes   . Schizoaffective disorder [facility])     Past Surgical History:  Procedure Laterality Date  . APPENDECTOMY    . BRAIN SURGERY    . CORONARY STENT INTERVENTION N/A 06/06/2020   Procedure: CORONARY STENT INTERVENTION;  Surgeon: Swaziland, Peter M, MD;  Location: [facility] INVASIVE CV LAB;  Service: Cardiovascular;  Laterality: N/A;  prox LAD, distal RCA  . INTRAVASCULAR ULTRASOUND/IVUS N/A 06/06/2020   Procedure: Intravascular Ultrasound/IVUS;  Surgeon: Swaziland, Peter M, MD;  Location: [facility] INVASIVE CV LAB;  Service: Cardiovascular;  Laterality: N/A;  . LEFT HEART CATH AND CORONARY ANGIOGRAPHY N/A 06/06/2020   Procedure: LEFT HEART CATH AND CORONARY ANGIOGRAPHY;  Surgeon: Swaziland, Peter M, MD;  Location: [facility] INVASIVE CV LAB;  Service: Cardiovascular;  Laterality: N/A;  . NASAL SEPTUM SURGERY    . WRIST FRACTURE SURGERY     right wrist    Current Medications: Current Meds  Medication Sig  . amphetamine-dextroamphetamine (ADDERALL) 20 MG tablet 1 po bid for 1 week, then 1 po q am for one week, then stop.  Marland Kitchen aspirin EC 81 MG tablet Take 1 tablet (81 mg total) by mouth daily. Swallow whole.  Marland Kitchen atorvastatin (LIPITOR) 40 MG tablet  Take 1 tablet (40 mg total) by mouth daily.  . carvedilol (COREG) 6.25 MG tablet Take 1 tablet (6.25 mg total) by mouth 2 (two) times daily.  . clonazePAM (KLONOPIN) 1 MG tablet Take 1 tablet (1 mg total) by mouth 3 (three) times daily as needed for anxiety.  . DULoxetine (CYMBALTA) 60 MG capsule TAKE 1 CAPSULE BY MOUTH 2 TIMES DAILY.  Marland Kitchen gabapentin (NEURONTIN) 800 MG tablet TAKE 1 TABLET BY MOUTH THREE TIMES A DAY  . lisinopril (ZESTRIL) 20 MG tablet Take 1 tablet (20 mg total) by mouth daily.  Marland Kitchen lithium carbonate (LITHOBID) 300 MG CR tablet TAKE 2 TABLETS (600 MG TOTAL) BY  MOUTH AT BEDTIME.  . nicotine polacrilex (NICORETTE) 2 MG gum Take 1 each (2 mg total) by mouth as needed for smoking cessation.  Marland Kitchen perphenazine (TRILAFON) 16 MG tablet Take 1 pill total 16 mg every morning and take 2 pills by mouth total 32 mg every bedtime  . ticagrelor (BRILINTA) 90 MG TABS tablet Take 1 tablet (90 mg total) by mouth 2 (two) times daily.  . traZODone (DESYREL) 100 MG tablet TAKE 3 TABLETS BY MOUTH EVERY DAY AT BEDTIME AS NEEDED FOR SLEEP  . XIGDUO XR 02-999 MG TB24 Take 1 tablet by mouth daily.  . [DISCONTINUED] atenolol (TENORMIN) 100 MG tablet Take 1 tablet (100 mg total) by mouth daily after breakfast.  . [DISCONTINUED] atorvastatin (LIPITOR) 20 MG tablet Take 20 mg by mouth daily.     Allergies:   Gluten meal   Social History   Socioeconomic History  . Marital status: Single    Spouse name: Not on file  . Number of children: 0  . Years of education: Not on file  . Highest education level: Not on file  Occupational History  . Occupation: [employer], unemployed  Tobacco Use  . Smoking status: Current Every Day Smoker    Packs/day: 1.00    Types: Cigarettes  . Smokeless tobacco: Never Used  Vaping Use  . Vaping Use: Never used  Substance and Sexual Activity  . Alcohol use: No    Alcohol/week: 0.0 standard drinks    Comment: no (02/08/15)  . Drug use: No  . Sexual activity: Not on file  Other Topics Concern  . Not on file  Social History Narrative   Pt lives at the [facility]   Social Determinants of Health   Financial Resource Strain: Not on file  Food Insecurity: Not on file  Transportation Needs: Not on file  Physical Activity: Not on file  Stress: Not on file  Social Connections: Not on file     Family History: The patient's family history includes Asthma in his mother; Hypertension in his father and mother.  ROS:   Please see the history of present illness.    Review of Systems  Constitutional: Negative for chills and fever.  HENT:  Negative for congestion.   Eyes: Negative for blurred vision.  Respiratory: Positive for shortness of breath.   Cardiovascular: Positive for palpitations. Negative for chest pain, orthopnea, claudication, leg swelling and PND.  Gastrointestinal: Negative for melena, nausea and vomiting.  Genitourinary: Negative for hematuria.  Musculoskeletal: Negative for falls.  Neurological: Positive for dizziness. Negative for loss of consciousness.  Endo/Heme/Allergies: Negative for polydipsia.  Psychiatric/Behavioral: Positive for depression and hallucinations. The patient is nervous/anxious.     EKGs/Labs/Other Studies Reviewed:    The following studies were reviewed today: Left Heart Cath 06/06/2020: 1. Severe 2 vessel obstructive CAD - 99%  proximal LAD - 90% distal RCA 2. Severe LV dysfunction. EF estimated at 25-30%. Segmental wall motion abnormalities. 3. Moderately elevated LVEDP 4. Successful PCI of the proximal LAD with IVUS guidance and DES x 1 5. Successful PCI of the distal RCA with IVUS guidance and DES x 1  EKG:  EKG 06/08/20: Sinus tach, q waves in anteroseptal and inferior leads (unchanged)  Recent Labs: 06/06/2020: TSH 2.644 06/07/2020: B Natriuretic Peptide 151.1; BUN 11; Creatinine, Ser 1.17; Hemoglobin 15.7; Magnesium 1.9; Platelets 171; Potassium 4.3; Sodium 133  Recent Lipid Panel    Component Value Date/Time   CHOL 717 (H) 06/06/2020 2032   TRIG 2,311 (H) 06/06/2020 2032   HDL NOT REPORTED DUE TO HIGH TRIGLYCERIDES 06/06/2020 2032   CHOLHDL NOT REPORTED DUE TO HIGH TRIGLYCERIDES 06/06/2020 2032   VLDL UNABLE TO CALCULATE IF TRIGLYCERIDE OVER 400 mg/dL 16/02/9603 5409   LDLCALC UNABLE TO CALCULATE IF TRIGLYCERIDE OVER 400 mg/dL 81/19/1478 2956   LDLDIRECT 51.0 06/06/2020 2032     Physical Exam:    VS:  BP 124/82   Pulse 95   Ht 6\' 2"  (1.88 m)   Wt 258 lb 6.4 oz (117.2 kg)   SpO2 98%   BMI 33.18 kg/m     Wt Readings from Last 3 Encounters:  06/22/20  258 lb 6.4 oz (117.2 kg)  06/08/20 254 lb 3.2 oz (115.3 kg)  06/06/20 270 lb (122.5 kg)     GEN:  Comfortable, NAD HEENT: Normal NECK: No JVD; No carotid bruits CARDIAC: RRR, no murmurs, rubs, gallops RESPIRATORY:  Clear to auscultation without rales, wheezing or rhonchi  ABDOMEN: Soft, non-tender, non-distended MUSCULOSKELETAL:  No edema; No deformity  SKIN: Warm and dry NEUROLOGIC:  Alert and oriented x 3 PSYCHIATRIC:  Normal affect   ASSESSMENT:    1. Coronary artery disease involving native coronary artery of native heart without angina pectoris   2. Essential hypertension   3. Obesity (BMI 30.0-34.9)   4. OSA (obstructive sleep apnea)   5. Ischemic cardiomyopathy   6. Chronic systolic heart failure (HCC)   7. Palpitations   8. Type 2 diabetes mellitus with hyperglycemia, without long-term current use of insulin (HCC)    PLAN:    In order of problems listed above:  #NSTEMI Patient presented to [facility] on [facility]/06[facility]20 with significant chest pain found to have elevated troponin c/w NSTEMI. Underwent coronary angiography which revealed 99% proximal LAD and 90% distal RCA both of which were treated successfuly with DES. LV gram with LVEF 25-30%. Unfortunately, patient left AMA overnight without optimization of medications. -Emphasized the importance of medication compliance and the risk of MI/death/arrhythmia if he is to stop his medications -Continue ASA and brilinta -Continue lisinopril -Change atenolol to coreg 6.25mg  BID -Increase lipitor to 40mg  daily -Needs cardiac rehab -Check TTE -Counseled about diet and exercise  #Ischemic Cardiomyopathy with LVEF 25-30%: Left AMA prior to medication optimization and TTE. EF approximately 25-30% on LV gram. Appears compensated on exam. -Check TTE -Continue lisinopril -Not a candidate for entresto/spiro due to non-compliance for now; will continue to reassess as patient has been showing up to appointments -Change atenolol to coreg as  above -Referral to endocrine for DM--hopefully SGLT-2 inhibitor in the future -Low Na diet -Check BMET at next visit  #Palpitations: Patient with palpitaitons every other day with associated SOB and lightheadedness. Can occur with exertion and at rest. Given LVEF 25%, concern for possible arrhythmia. -Check 7day zio -Change atenolol to coreg as above -Check TTE as above -  BMET at next visit  #DMII: A1C 12.6%. -Refer to Endocrinology -Hopefully SGLT-2 inhibitor in the future but needs aggressive glucose management  #OSA not on CPAP: -Follow-up with Pulm  #Tobacco abuse: -Cessation counseling  Extensive time spent talking to the patient about medication compliance, following up at appointments and diet/lifestyle changes. He is motivated and has followed up at all scheduled visits. Will see him back in 1 month.  Medication Adjustments/Labs and Tests Ordered: Current medicines are reviewed at length with the patient today.  Concerns regarding medicines are outlined above.  Orders Placed This Encounter  Procedures  . AMB referral to cardiac rehabilitation  . Ambulatory referral to Endocrinology  . LONG TERM MONITOR (3-14 DAYS)   Meds ordered this encounter  Medications  . atorvastatin (LIPITOR) 40 MG tablet    Sig: Take 1 tablet (40 mg total) by mouth daily.    Dispense:  90 tablet    Refill:  3  . carvedilol (COREG) 6.25 MG tablet    Sig: Take 1 tablet (6.25 mg total) by mouth 2 (two) times daily.    Dispense:  180 tablet    Refill:  3    Patient Instructions  Medication Instructions:  Your physician has recommended you make the following change in your medication:  1. STOP ATENOLOL  2. START COREG 6.25 MG TWICE DAILY  3. INCREASE ATORVASTATIN TO 40 MG DAILY  *If you need a refill on your cardiac medications before your next appointment, please call your pharmacy*  Lab Work: NONE If you have labs (blood work) drawn today and your tests are completely normal, you  will receive your results only by: Marland Kitchen. MyChart Message (if you have MyChart) OR . A paper copy in the mail If you have any lab test that is abnormal or we need to change your treatment, we will call you to review the results.   Testing/Procedures: Your physician has recommended that you wear a 7 DAY DAY ZIO event monitor. Event monitors are medical devices that record the heart's electrical activity. Doctors most often us these monitors to diagnose arrhythmias. Arrhythmias are problems with the speed or rhythm of the heartbeat. The monitor is a small, portable device. You can wear one while you do your normal daily activities. This is usually used to diagnose what is causing palpitations/syncope (passing out).   Follow-Up: At [facility], you and your health needs are our priority.  As part of our continuing mission to provide you with exceptional heart care, we have created designated Provider Care Teams.  These Care Teams include your primary Cardiologist (physician) and Advanced Practice Providers (APPs -  Physician Assistants and Nurse Practitioners) who all work together to provide you with the care you need, when you need it.  We recommend signing up for the patient portal called "MyChart".  Sign up information is provided on this After Visit Summary.  MyChart is used to connect with patients for Virtual Visits (Telemedicine).  Patients are able to view lab/test results, encounter notes, upcoming appointments, etc.  Non-urgent messages can be sent to your provider as well.   To learn more about what you can do with MyChart, go to [email or web address].    Your next appointment:   1 month(s)  The format for your next appointment:   In Person  Provider:   You may see Meriam SpragueHeather E Dantae Meunier, MD or one of the following Advanced Practice Providers on your designated Care Team:    Tereso NewcomerScott Weaver, PA-C  Vin BelfastBhagat, [place]-C  Other Instructions  You have been referred to CARDIAC [facility] AND  TO SEE AND ENDOCRINOLOGIST       Signed, Meriam Sprague, MD  06/22/2020 12:09 PM    [facility]

## 2020-06-22 ENCOUNTER — Encounter: Payer: Self-pay | Admitting: Radiology

## 2020-06-22 ENCOUNTER — Other Ambulatory Visit: Payer: Self-pay

## 2020-06-22 ENCOUNTER — Ambulatory Visit: Payer: Medicaid Other | Admitting: Cardiology

## 2020-06-22 ENCOUNTER — Ambulatory Visit (INDEPENDENT_AMBULATORY_CARE_PROVIDER_SITE_OTHER): Payer: Medicaid Other

## 2020-06-22 ENCOUNTER — Telehealth (HOSPITAL_COMMUNITY): Payer: Self-pay

## 2020-06-22 ENCOUNTER — Ambulatory Visit (INDEPENDENT_AMBULATORY_CARE_PROVIDER_SITE_OTHER): Payer: Medicaid Other | Admitting: Cardiology

## 2020-06-22 ENCOUNTER — Encounter: Payer: Self-pay | Admitting: Cardiology

## 2020-06-22 VITALS — BP 124/82 | HR 95 | Ht 74.0 in | Wt 258.4 lb

## 2020-06-22 DIAGNOSIS — G4733 Obstructive sleep apnea (adult) (pediatric): Secondary | ICD-10-CM | POA: Diagnosis not present

## 2020-06-22 DIAGNOSIS — E1165 Type 2 diabetes mellitus with hyperglycemia: Secondary | ICD-10-CM

## 2020-06-22 DIAGNOSIS — E669 Obesity, unspecified: Secondary | ICD-10-CM | POA: Diagnosis not present

## 2020-06-22 DIAGNOSIS — R002 Palpitations: Secondary | ICD-10-CM | POA: Diagnosis not present

## 2020-06-22 DIAGNOSIS — I2510 Atherosclerotic heart disease of native coronary artery without angina pectoris: Secondary | ICD-10-CM

## 2020-06-22 DIAGNOSIS — I10 Essential (primary) hypertension: Secondary | ICD-10-CM

## 2020-06-22 DIAGNOSIS — I5022 Chronic systolic (congestive) heart failure: Secondary | ICD-10-CM

## 2020-06-22 DIAGNOSIS — I255 Ischemic cardiomyopathy: Secondary | ICD-10-CM

## 2020-06-22 MED ORDER — ATORVASTATIN CALCIUM 40 MG PO TABS: 40.0000 mg | ORAL_TABLET | Freq: Every day | ORAL | 3 refills | Status: DC

## 2020-06-22 MED ORDER — CARVEDILOL 6.25 MG PO TABS: 6.2500 mg | ORAL_TABLET | Freq: Two times a day (BID) | ORAL | 3 refills | Status: DC

## 2020-06-22 NOTE — Telephone Encounter (Signed)
Attempted to call patient in regards to Cardiac Rehab - LM on VM 

## 2020-06-22 NOTE — Patient Instructions (Signed)
Medication Instructions:  Your physician has recommended you make the following change in your medication:  1. STOP ATENOLOL  2. START COREG 6.25 MG TWICE DAILY  3. INCREASE ATORVASTATIN TO 40 MG DAILY  *If you need a refill on your cardiac medications before your next appointment, please call your pharmacy*  Lab Work: NONE If you have labs (blood work) drawn today and your tests are completely normal, you will receive your results only by: Marland Kitchen MyChart Message (if you have MyChart) OR . A paper copy in the mail If you have any lab test that is abnormal or we need to change your treatment, we will call you to review the results.   Testing/Procedures: Your physician has recommended that you wear a 7 DAY DAY ZIO event monitor. Event monitors are medical devices that record the heart's electrical activity. Doctors most often [place] these monitors to diagnose arrhythmias. Arrhythmias are problems with the speed or rhythm of the heartbeat. The monitor is a small, portable device. You can wear one while you do your normal daily activities. This is usually used to diagnose what is causing palpitations/syncope (passing out).   Follow-Up: At [facility], you and your health needs are our priority.  As part of our continuing mission to provide you with exceptional heart care, we have created designated Provider Care Teams.  These Care Teams include your primary Cardiologist (physician) and Advanced Practice Providers (APPs -  Physician Assistants and Nurse Practitioners) who all work together to provide you with the care you need, when you need it.  We recommend signing up for the patient portal called "MyChart".  Sign up information is provided on this After Visit Summary.  MyChart is used to connect with patients for Virtual Visits (Telemedicine).  Patients are able to view lab/test results, encounter notes, upcoming appointments, etc.  Non-urgent messages can be sent to your provider as well.   To  learn more about what you can do with MyChart, go to [email or web address].    Your next appointment:   1 month(s)  The format for your next appointment:   In Person  Provider:   You may see Meriam Sprague, MD or one of the following Advanced Practice Providers on your designated Care Team:    Tereso Newcomer, PA-C  Vin [place], [place]    Other Instructions  You have been referred to CARDIAC [facility] AND TO SEE AND ENDOCRINOLOGIST

## 2020-06-22 NOTE — Telephone Encounter (Signed)
Pt insurance is active and benefits verified through Medicaid. Co-pay $3.00, DED $0.00/$0.00 met, out of pocket $0.00/$0.00 met, co-insurance 0%. No pre-authorization required. Passport, 06/22/20 @ 2:54PM, GJF#[id number]  Will contact patient to see if he is interested in the Cardiac Rehab Program. If interested, patient will need to complete follow up appt. Once completed, patient will be contacted for scheduling upon review by the RN Navigator

## 2020-06-22 NOTE — Progress Notes (Signed)
Enrolled patient for a 7 day Zio XT Monitor to be mailed to patients home.  

## 2020-06-29 ENCOUNTER — Ambulatory Visit: Payer: Medicaid Other | Admitting: Adult Health

## 2020-06-30 ENCOUNTER — Telehealth (HOSPITAL_COMMUNITY): Payer: Self-pay

## 2020-06-30 NOTE — Telephone Encounter (Signed)
Called patient to see if he was interested in participating in the Cardiac Rehab Program. Patient stated yes. Patient will come in for orientation on 08/16/20 @ 1:15PM and will attend the 1:30PM exercise class. Went over insurance, patient verbalized understanding.   Adv pt to complete his follow up appt on 3/28 with Dr. Lonzo Cloud and the understanding his blood sugar must be below 300 in order to exercise. He verbalized understanding.  Pensions consultant.

## 2020-07-04 ENCOUNTER — Telehealth: Payer: Self-pay | Admitting: Physician Assistant

## 2020-07-04 NOTE — Telephone Encounter (Signed)
NO STIMULANT. Not negotiable. He already knows this. We talked about it at his last visit. He is on high-dose Klonopin, blood pressure was elevated, and he had an MI recently. So no stimulant. Period.

## 2020-07-04 NOTE — Telephone Encounter (Signed)
Pt called and said that he would like to try ritalin for his ADHD. He said that he can't focus and he can't sit down for more than 5 minutes. His pharmacy is [facility] [address]

## 2020-07-05 NOTE — Telephone Encounter (Signed)
I spoke to him and he wants to know is there any thing else that can help with his symptoms.

## 2020-07-06 NOTE — Telephone Encounter (Signed)
Pt has been called and informed.  

## 2020-07-06 NOTE — Telephone Encounter (Signed)
Let him know we'll discuss at next appt. I won't start a new medicine without seeing him.

## 2020-07-17 NOTE — Progress Notes (Signed)
Cardiology Office Note:    Date:  07/20/2020   ID:  Matthew [name], DOB 09/04/1975, MRN [id number]  PCP:  [name], MD   [facility]  Cardiologist:  Meriam Sprague, MD  Advanced Practice Provider:  No care team member to display Electrophysiologist:  None   Referring MD: [name], MD    History of Present Illness:    Matthew [name] is a 45 y.o. male with a hx of HTN, HLD, pre-DM, celiac dz, schizoaffective d/o, anxiety, ADHD, OSAnot on CPAP, and recent NSTEMI s/p DES to pLAD and DES to dRCA who presents to clinic for follow-up.  Patient admitted with NSTEMI 06/06/20 treated with DES pLAD and DES dRCA, and severe LVEF 25-30%. Patient left AMA 06/08/19 and was seen urgently in clinic by Jacolyn Reedy. During that visit, he was more SOB and had been eating a lot of fast food and had not filled his scripts for atenolol or lisinopril. Since that time, he has filled his prescriptions and has been taking all medications as prescribed. He understands the importance of taking his medications and that stopping his ASA or brilinta could lead to a massive MI and/or death. He states that since his hospitalization, he has been feeling overall okay.  Occasionally has some palpitations with some short of breath and mild lightheadedness. Episodes occur about every other day and last a couple of minutes before resolving. Can happen at rest or with activity. No chest pain.   During last visit on 06/22/20, the patient had been exercising more. Was having palpitations. He was referred for cardiac monitor and cardiac rehab.  Cardiac monitor showed: Patch Wear Time:  7 days and 17 hours (2022-02-12T16:36:22-0500 to 2022-02-20T10:06:03-499)  Patient had a min HR of 55 bpm, max HR of 135 bpm, and avg HR of 88 bpm. Predominant underlying rhythm was Sinus Rhythm. No Isolated SVEs, SVE Couplets, or SVE Triplets were present. Isolated VEs were rare (<1.0%), VE Couplets were rare  (<1.0%), and no  VE Triplets were present.   Patient states that he overall feels okay. Has occasional chest discomfort at rest which feels like a soreness/tightness in the right side of his chest. Lasts about 1 hour and then resolves on it's own. Patient has tried to increase his walking and has some heart racing with this. Plans to start cardiac rehab. No LE edema, orthopnea, or PND. Continues to smoke; has been trying to use gum but has not been working. Has been taking his medications as prescribed.    Past Medical History:  Diagnosis Date  . Anxiety   . Celiac disease   . Dairy allergy   . Depression   . High cholesterol   . Hypertension   . Paranoia (HCC)   . Pre-diabetes   . Schizoaffective disorder [facility])     Past Surgical History:  Procedure Laterality Date  . APPENDECTOMY    . BRAIN SURGERY    . CORONARY STENT INTERVENTION N/A 06/06/2020   Procedure: CORONARY STENT INTERVENTION;  Surgeon: Swaziland, Peter M, MD;  Location: [facility] INVASIVE CV LAB;  Service: Cardiovascular;  Laterality: N/A;  prox LAD, distal RCA  . INTRAVASCULAR ULTRASOUND/IVUS N/A 06/06/2020   Procedure: Intravascular Ultrasound/IVUS;  Surgeon: Swaziland, Peter M, MD;  Location: [facility] INVASIVE CV LAB;  Service: Cardiovascular;  Laterality: N/A;  . LEFT HEART CATH AND CORONARY ANGIOGRAPHY N/A 06/06/2020   Procedure: LEFT HEART CATH AND CORONARY ANGIOGRAPHY;  Surgeon: Swaziland, Peter M, MD;  Location: [facility] INVASIVE  CV LAB;  Service: Cardiovascular;  Laterality: N/A;  . NASAL SEPTUM SURGERY    . WRIST FRACTURE SURGERY     right wrist    Current Medications: Current Meds  Medication Sig  . aspirin EC 81 MG tablet Take 1 tablet (81 mg total) by mouth daily. Swallow whole.  Marland Kitchen atorvastatin (LIPITOR) 40 MG tablet Take 1 tablet (40 mg total) by mouth daily.  . carvedilol (COREG) 6.25 MG tablet Take 1 tablet (6.25 mg total) by mouth 2 (two) times daily.  . clonazePAM (KLONOPIN) 1 MG tablet TAKE 1 TABLET BY MOUTH 3 TIMES DAILY AS  NEEDED FOR ANXIETY.  . DULoxetine (CYMBALTA) 60 MG capsule TAKE 1 CAPSULE BY MOUTH 2 TIMES DAILY.  Marland Kitchen empagliflozin (JARDIANCE) 10 MG TABS tablet Take 1 tablet (10 mg total) by mouth daily before breakfast.  . gabapentin (NEURONTIN) 800 MG tablet TAKE 1 TABLET BY MOUTH THREE TIMES A DAY  . lisinopril (ZESTRIL) 20 MG tablet Take 1 tablet (20 mg total) by mouth daily.  Marland Kitchen lithium carbonate (LITHOBID) 300 MG CR tablet TAKE 2 TABLETS (600 MG TOTAL) BY MOUTH AT BEDTIME.  . metFORMIN (GLUCOPHAGE) 1000 MG tablet Take 1 tablet (1,000 mg total) by mouth daily with breakfast.  . nicotine (NICODERM CQ - DOSED IN MG/24 HOURS) 21 mg/24hr patch Place 1 patch (21 mg total) onto the skin daily.  . nicotine polacrilex (NICORETTE) 2 MG gum Take 1 each (2 mg total) by mouth as needed for smoking cessation.  Marland Kitchen perphenazine (TRILAFON) 16 MG tablet Take 1 pill total 16 mg every morning and take 2 pills by mouth total 32 mg every bedtime  . spironolactone (ALDACTONE) 25 MG tablet Take 0.5 tablets (12.5 mg total) by mouth daily.  . ticagrelor (BRILINTA) 90 MG TABS tablet Take 1 tablet (90 mg total) by mouth 2 (two) times daily.  . traZODone (DESYREL) 100 MG tablet TAKE 3 TABLETS BY MOUTH EVERY DAY AT BEDTIME AS NEEDED FOR SLEEP     Allergies:   Gluten meal   Social History   Socioeconomic History  . Marital status: Single    Spouse name: Not on file  . Number of children: 0  . Years of education: Not on file  . Highest education level: Not on file  Occupational History  . Occupation: [employer], unemployed  Tobacco Use  . Smoking status: Current Every Day Smoker    Packs/day: 1.00    Types: Cigarettes  . Smokeless tobacco: Never Used  Vaping Use  . Vaping Use: Never used  Substance and Sexual Activity  . Alcohol use: No    Alcohol/week: 0.0 standard drinks    Comment: no (02/08/15)  . Drug use: No  . Sexual activity: Not on file  Other Topics Concern  . Not on file  Social History Narrative   Pt [facility]  at the [facility]   Social Determinants of Health   Financial Resource Strain: Not on file  Food Insecurity: Not on file  Transportation Needs: Not on file  Physical Activity: Not on file  Stress: Not on file  Social Connections: Not on file     Family History: The patient's family history includes Asthma in his mother; Hypertension in his father and mother.  ROS:   Please see the history of present illness.    Review of Systems  Constitutional: Negative for chills and fever.  HENT: Negative for hearing loss.   Eyes: Negative for blurred vision and redness.  Respiratory: Negative for shortness  of breath.   Cardiovascular: Positive for chest pain and palpitations. Negative for orthopnea, claudication, leg swelling and PND.  Gastrointestinal: Negative for melena, nausea and vomiting.  Genitourinary: Negative for dysuria and flank pain.  Musculoskeletal: Negative for falls.  Neurological: Negative for dizziness and loss of consciousness.  Endo/Heme/Allergies: Negative for polydipsia.  Psychiatric/Behavioral: Positive for depression.    EKGs/Labs/Other Studies Reviewed:    The following studies were reviewed today: Left Heart Cath 06/06/2020: 1. Severe 2 vessel obstructive CAD - 99% proximal LAD - 90% distal RCA 2. Severe LV dysfunction. EF estimated at 25-30%. Segmental wall motion abnormalities. 3. Moderately elevated LVEDP 4. Successful PCI of the proximal LAD with IVUS guidance and DES x 1 5. Successful PCI of the distal RCA with IVUS guidance and DES x 1  Cardiac monitor 07/19/20: Patch Wear Time:  7 days and 17 hours (2022-02-12T16:36:22-0500 to 2022-02-20T10:06:03-499)  Patient had a min HR of 55 bpm, max HR of 135 bpm, and avg HR of 88 bpm. Predominant underlying rhythm was Sinus Rhythm. No Isolated SVEs, SVE Couplets, or SVE Triplets were present. Isolated VEs were rare (<1.0%), VE Couplets were rare (<1.0%), and no  VE Triplets were present.   EKG:   EKG is  ordered today.  The ekg ordered today demonstrates sinus tachycardia with HR 106, first degree AVB, poor r-wave progression, inferior infarct  Recent Labs: 06/06/2020: TSH 2.644 06/07/2020: B Natriuretic Peptide 151.1; BUN 11; Creatinine, Ser 1.17; Hemoglobin 15.7; Magnesium 1.9; Platelets 171; Potassium 4.3; Sodium 133  Recent Lipid Panel    Component Value Date/Time   CHOL 717 (H) 06/06/2020 2032   TRIG 2,311 (H) 06/06/2020 2032   HDL NOT REPORTED DUE TO HIGH TRIGLYCERIDES 06/06/2020 2032   CHOLHDL NOT REPORTED DUE TO HIGH TRIGLYCERIDES 06/06/2020 2032   VLDL UNABLE TO CALCULATE IF TRIGLYCERIDE OVER 400 mg/dL 16/10/960401/17/2022 [id number]   LDLCALC UNABLE TO CALCULATE IF TRIGLYCERIDE OVER 400 mg/dL 81/19/147801/17/2022 [id number]   LDLDIRECT 51.0 06/06/2020 2032     Physical Exam:    VS:  BP 124/90   Pulse (!) 106   Ht 6\' 2"  (1.88 m)   Wt 254 lb 3.2 oz (115.3 kg)   SpO2 96%   BMI 32.64 kg/m     Wt Readings from Last 3 Encounters:  07/20/20 254 lb 3.2 oz (115.3 kg)  06/22/20 258 lb 6.4 oz (117.2 kg)  06/08/20 254 lb 3.2 oz (115.3 kg)     GEN:  Well nourished, well developed in no acute distress HEENT: Normal NECK: No JVD; No carotid bruits CARDIAC: RRR, no murmurs, rubs, gallops RESPIRATORY:  Clear to auscultation without rales, wheezing or rhonchi  ABDOMEN: Soft, non-tender, non-distended MUSCULOSKELETAL:  No edema; No deformity  SKIN: Warm and dry NEUROLOGIC:  Alert and oriented x 3 PSYCHIATRIC:  Normal affect   ASSESSMENT:    1. Ischemic cardiomyopathy   2. Palpitations   3. Obesity (BMI 30.0-34.9)   4. OSA (obstructive sleep apnea)   5. Type 2 diabetes mellitus with hyperglycemia, without long-term current use of insulin (HCC)   6. Chronic systolic heart failure (HCC)   7. Coronary artery disease involving native coronary artery of native heart without angina pectoris   8. Essential hypertension    PLAN:    In order of problems listed above:  #NSTEMI: #Multivessel  CAD: [facility] with significant chest pain found to have elevated troponin c/w NSTEMI. Underwent coronary angiography which revealed 99% proximal LAD and 90% distal RCA both of  which were treated successfuly with DES. LV gram with LVEF 25-30%. Unfortunately, patient left AMA overnight without optimization of medications. Now much more compliant and has been taking all medications as prescribed and followed up at all appointments. Symptoms much improved and increasing daily exercise. -Follow-up TTE -Continue ASA and brilinta -Continue lisinopril  daily--will change to entresto pending TTE -Continue coreg 6.25mg  BID -Continue lipitor to  daily -Start spironolactone 12.5mg  daily -Start jardiance  daily -BMET next week -Patient enrolled in cardiac rehab -Counseled about diet and exercise  #Ischemic Cardiomyopathy with LVEF 25-30%: Left AMA prior to medication optimization and TTE. EF approximately 25-30% on LV gram. Appears compensated on exam. Repeat TTE pending -Follow-up TTE -Continue lisinopril--will change to entresto pending TTE EF -Continue coreg as above -Start spironolactone 12.5mg  daily and jardiance  daily as above -Low Na diet  #Palpitations: Improved. Cardiac monitor without significant ectopy. Rare PVCs.  -Continue coreg as above -Follow-up TTE as above  #DMII: A1C 12.6%. -Referred to Endocrinology -Start metformin  daily -Start jardiance  daily  #OSA not on CPAP: -Follow-up with Pulm  #Tobacco abuse: -Cessation counseling -Start nicotine patches   Medication Adjustments/Labs and Tests Ordered: Current medicines are reviewed at length with the patient today.  Concerns regarding medicines are outlined above.  Orders Placed This Encounter  Procedures  . Basic metabolic panel  . EKG 12-Lead  . ECHOCARDIOGRAM COMPLETE   Meds ordered this encounter  Medications  . spironolactone (ALDACTONE) 25 MG tablet     Sig: Take 0.5 tablets (12.5 mg total) by mouth daily.    Dispense:  45 tablet    Refill:  3  . metFORMIN (GLUCOPHAGE) 1000 MG tablet    Sig: Take 1 tablet (1,000 mg total) by mouth daily with breakfast.    Dispense:  30 tablet    Refill:  1  . empagliflozin (JARDIANCE) 10 MG TABS tablet    Sig: Take 1 tablet (10 mg total) by mouth daily before breakfast.    Dispense:  30 tablet    Refill:  1  . nicotine (NICODERM CQ - DOSED IN MG/24 HOURS) 21 mg/24hr patch    Sig: Place 1 patch (21 mg total) onto the skin daily.    Dispense:  28 patch    Refill:  3    Patient Instructions   Medication Instructions:  1) START SPIRONOLACTONE 12.5 mg daily (you will have to cute a 25 mg tablet in half) 2) START METFORMIN 1000 mg daily 3) START JARDIANCE 10 mg daily 4) Nicotine patches have been called in for you  *If you need a refill on your cardiac medications before your next appointment, please call your pharmacy*  Lab Work: Your provider recommends that you return for lab work in 1 week. If you have labs (blood work) drawn today and your tests are completely normal, you will receive your results only by: Marland Kitchen MyChart Message (if you have MyChart) OR . A paper copy in the mail If you have any lab test that is abnormal or we need to change your treatment, we will call you to review the results.  Testing/Procedures: Your provider has requested that you have an echocardiogram. Echocardiography is a painless test that uses sound waves to create images of your heart. It provides your doctor with information about the size and shape of your heart and how well your heart's chambers and valves are working. This procedure takes approximately one hour. There are no restrictions for this procedure.  Follow-Up: Your provider recommends  that you schedule a follow-up appointment in 6 weeks with Dr. Shari Prows.   Mediterranean Diet A Mediterranean diet refers to food and lifestyle choices that are based on the  traditions of countries located on the [place]. This way of eating has been shown to help prevent certain conditions and improve outcomes for people who have chronic diseases, like kidney disease and heart disease. What are tips for following this plan? Lifestyle  Cook and eat meals together with your family, when possible.  Drink enough fluid to keep your urine clear or pale yellow.  Be physically active every day. This includes: ? Aerobic exercise like running or swimming. ? Leisure activities like gardening, walking, or housework.  Get 7-8 hours of sleep each night.  If recommended by your health care provider, drink red wine in moderation. This means 1 glass a day for nonpregnant women and 2 glasses a day for men. A glass of wine equals 5 oz (150 mL). Reading food labels  Check the serving size of packaged foods. For foods such as rice and pasta, the serving size refers to the amount of cooked product, not dry.  Check the total fat in packaged foods. Avoid foods that have saturated fat or trans fats.  Check the ingredients list for added sugars, such as corn syrup.   Shopping  At the grocery store, buy most of your food from the areas near the walls of the store. This includes: ? Fresh fruits and vegetables (produce). ? Grains, beans, nuts, and seeds. Some of these may be available in unpackaged forms or large amounts (in bulk). ? Fresh seafood. ? Poultry and eggs. ? Low-fat dairy products.  Buy whole ingredients instead of prepackaged foods.  Buy fresh fruits and vegetables in-season from local farmers markets.  Buy frozen fruits and vegetables in resealable bags.  If you do not have access to quality fresh seafood, buy precooked frozen shrimp or canned fish, such as tuna, salmon, or sardines.  Buy small amounts of raw or cooked vegetables, salads, or olives from the deli or salad bar at your store.  Stock your pantry so you always have certain foods on hand,  such as olive oil, canned tuna, canned tomatoes, rice, pasta, and beans. Cooking  Cook foods with extra-virgin olive oil instead of using butter or other vegetable oils.  Have meat as a side dish, and have vegetables or grains as your main dish. This means having meat in small portions or adding small amounts of meat to foods like pasta or stew.  Use beans or vegetables instead of meat in common dishes like chili or lasagna.  Experiment with different cooking methods. Try roasting or broiling vegetables instead of steaming or sauteing them.  Add frozen vegetables to soups, stews, pasta, or rice.  Add nuts or seeds for added healthy fat at each meal. You can add these to yogurt, salads, or vegetable dishes.  Marinate fish or vegetables using olive oil, lemon juice, garlic, and fresh herbs. Meal planning  Plan to eat 1 vegetarian meal one day each week. Try to work up to 2 vegetarian meals, if possible.  Eat seafood 2 or more times a week.  Have healthy snacks readily available, such as: ? Vegetable sticks with hummus. ? [place] yogurt. ? Fruit and nut trail mix.  Eat balanced meals throughout the week. This includes: ? Fruit: 2-3 servings a day ? Vegetables: 4-5 servings a day ? Low-fat dairy: 2 servings a day ? Fish, poultry, or lean  meat: 1 serving a day ? Beans and legumes: 2 or more servings a week ? Nuts and seeds: 1-2 servings a day ? Whole grains: 6-8 servings a day ? Extra-virgin olive oil: 3-4 servings a day  Limit red meat and sweets to only a few servings a month   What are my food choices?  Mediterranean diet ? Recommended  Grains: Whole-grain pasta. Brown rice. Bulgar wheat. Polenta. Couscous. Whole-wheat bread. Orpah Cobb.  Vegetables: Artichokes. Beets. Broccoli. Cabbage. Carrots. Eggplant. Green beans. Chard. Kale. Spinach. Onions. Leeks. Peas. Squash. Tomatoes. Peppers. Radishes.  Fruits: Apples. Apricots. Avocado. Berries. Bananas. Cherries. Dates.  Figs. Grapes. Lemons. Melon. Oranges. Peaches. Plums. Pomegranate.  Meats and other protein foods: Beans. Almonds. Sunflower seeds. Pine nuts. Peanuts. Cod. Salmon. Scallops. Shrimp. Tuna. Tilapia. Clams. Oysters. Eggs.  Dairy: Low-fat milk. Cheese. Greek yogurt.  Beverages: Water. Red wine. Herbal tea.  Fats and oils: Extra virgin olive oil. Avocado oil. Grape seed oil.  Sweets and desserts: [place] yogurt with honey. Baked apples. Poached pears. Trail mix.  Seasoning and other foods: Basil. Cilantro. Coriander. Cumin. Mint. Parsley. Sage. Rosemary. Tarragon. Garlic. Oregano. Thyme. Pepper. Balsalmic vinegar. Tahini. Hummus. Tomato sauce. Olives. Mushrooms. ? Limit these  Grains: Prepackaged pasta or rice dishes. Prepackaged cereal with added sugar.  Vegetables: Deep fried potatoes (french fries).  Fruits: Fruit canned in syrup.  Meats and other protein foods: Beef. Pork. Lamb. Poultry with skin. Hot dogs. Tomasa Blase.  Dairy: Ice cream. Sour cream. Whole milk.  Beverages: Juice. Sugar-sweetened soft drinks. Beer. Liquor and spirits.  Fats and oils: Butter. Canola oil. Vegetable oil. Beef fat (tallow). Lard.  Sweets and desserts: Cookies. Cakes. Pies. Candy.  Seasoning and other foods: Mayonnaise. Premade sauces and marinades. The items listed may not be a complete list. Talk with your dietitian about what dietary choices are right for you. Summary  The Mediterranean diet includes both food and lifestyle choices.  Eat a variety of fresh fruits and vegetables, beans, nuts, seeds, and whole grains.  Limit the amount of red meat and sweets that you eat.  Talk with your health care provider about whether it is safe for you to drink red wine in moderation. This means 1 glass a day for nonpregnant women and 2 glasses a day for men. A glass of wine equals 5 oz (150 mL). This information is not intended to replace advice given to you by your health care provider. Make sure you discuss any  questions you have with your health care provider. Document Revised: 01/05/2016 Document Reviewed: 12/29/2015 Elsevier Patient Education  2020 [employer].     Signed, Meriam Sprague, MD  07/20/2020 10:55 AM    [facility]

## 2020-07-18 ENCOUNTER — Other Ambulatory Visit: Payer: Self-pay | Admitting: Physician Assistant

## 2020-07-20 ENCOUNTER — Ambulatory Visit: Payer: Medicaid Other | Admitting: Cardiology

## 2020-07-20 ENCOUNTER — Encounter: Payer: Self-pay | Admitting: Cardiology

## 2020-07-20 ENCOUNTER — Other Ambulatory Visit: Payer: Self-pay

## 2020-07-20 VITALS — BP 124/90 | HR 106 | Ht 74.0 in | Wt 254.2 lb

## 2020-07-20 DIAGNOSIS — I255 Ischemic cardiomyopathy: Secondary | ICD-10-CM | POA: Diagnosis not present

## 2020-07-20 DIAGNOSIS — R002 Palpitations: Secondary | ICD-10-CM | POA: Diagnosis not present

## 2020-07-20 DIAGNOSIS — G4733 Obstructive sleep apnea (adult) (pediatric): Secondary | ICD-10-CM

## 2020-07-20 DIAGNOSIS — I2510 Atherosclerotic heart disease of native coronary artery without angina pectoris: Secondary | ICD-10-CM

## 2020-07-20 DIAGNOSIS — I5022 Chronic systolic (congestive) heart failure: Secondary | ICD-10-CM

## 2020-07-20 DIAGNOSIS — I10 Essential (primary) hypertension: Secondary | ICD-10-CM

## 2020-07-20 DIAGNOSIS — E669 Obesity, unspecified: Secondary | ICD-10-CM | POA: Diagnosis not present

## 2020-07-20 DIAGNOSIS — E1165 Type 2 diabetes mellitus with hyperglycemia: Secondary | ICD-10-CM

## 2020-07-20 MED ORDER — METFORMIN HCL 1000 MG PO TABS: 1000.0000 mg | ORAL_TABLET | Freq: Every day | ORAL | 1 refills | Status: DC

## 2020-07-20 MED ORDER — NICOTINE 21 MG/24HR TD PT24: 21.0000 mg | MEDICATED_PATCH | Freq: Every day | TRANSDERMAL | 3 refills | Status: DC

## 2020-07-20 MED ORDER — EMPAGLIFLOZIN 10 MG PO TABS: 10.0000 mg | ORAL_TABLET | Freq: Every day | ORAL | 1 refills | Status: DC

## 2020-07-20 MED ORDER — SPIRONOLACTONE 25 MG PO TABS: 12.5000 mg | ORAL_TABLET | Freq: Every day | ORAL | 3 refills | Status: DC

## 2020-07-20 NOTE — Patient Instructions (Signed)
Medication Instructions:  1) START SPIRONOLACTONE 12.5 mg daily (you will have to cute a 25 mg tablet in half) 2) START METFORMIN 1000 mg daily 3) START JARDIANCE 10 mg daily 4) Nicotine patches have been called in for you  *If you need a refill on your cardiac medications before your next appointment, please call your pharmacy*  Lab Work: Your provider recommends that you return for lab work in 1 week. If you have labs (blood work) drawn today and your tests are completely normal, you will receive your results only by: Marland Kitchen MyChart Message (if you have MyChart) OR . A paper copy in the mail If you have any lab test that is abnormal or we need to change your treatment, we will call you to review the results.  Testing/Procedures: Your provider has requested that you have an echocardiogram. Echocardiography is a painless test that uses sound waves to create images of your heart. It provides your doctor with information about the size and shape of your heart and how well your heart's chambers and valves are working. This procedure takes approximately one hour. There are no restrictions for this procedure.  Follow-Up: Your provider recommends that you schedule a follow-up appointment in 6 weeks with Dr. Shari Prows.   Mediterranean Diet A Mediterranean diet refers to food and lifestyle choices that are based on the traditions of countries located on the [employer] [place]. This way of eating has been shown to help prevent certain conditions and improve outcomes for people who have chronic diseases, like kidney disease and heart disease. What are tips for following this plan? Lifestyle  Cook and eat meals together with your family, when possible.  Drink enough fluid to keep your urine clear or pale yellow.  Be physically active every day. This includes: ? Aerobic exercise like running or swimming. ? Leisure activities like gardening, walking, or housework.  Get 7-8 hours of sleep each  night.  If recommended by your health care provider, drink red wine in moderation. This means 1 glass a day for nonpregnant women and 2 glasses a day for men. A glass of wine equals 5 oz (150 mL). Reading food labels  Check the serving size of packaged foods. For foods such as rice and pasta, the serving size refers to the amount of cooked product, not dry.  Check the total fat in packaged foods. Avoid foods that have saturated fat or trans fats.  Check the ingredients list for added sugars, such as corn syrup.   Shopping  At the grocery store, buy most of your food from the areas near the walls of the store. This includes: ? Fresh fruits and vegetables (produce). ? Grains, beans, nuts, and seeds. Some of these may be available in unpackaged forms or large amounts (in bulk). ? Fresh seafood. ? Poultry and eggs. ? Low-fat dairy products.  Buy whole ingredients instead of prepackaged foods.  Buy fresh fruits and vegetables in-season from local farmers markets.  Buy frozen fruits and vegetables in resealable bags.  If you do not have access to quality fresh seafood, buy precooked frozen shrimp or canned fish, such as tuna, salmon, or sardines.  Buy small amounts of raw or cooked vegetables, salads, or olives from the deli or salad bar at your store.  Stock your pantry so you always have certain foods on hand, such as olive oil, canned tuna, canned tomatoes, rice, pasta, and beans. Cooking  Cook foods with extra-virgin olive oil instead of using butter or other vegetable  oils.  Have meat as a side dish, and have vegetables or grains as your main dish. This means having meat in small portions or adding small amounts of meat to foods like pasta or stew.  Use beans or vegetables instead of meat in common dishes like chili or lasagna.  Experiment with different cooking methods. Try roasting or broiling vegetables instead of steaming or sauteing them.  Add frozen vegetables to soups,  stews, pasta, or rice.  Add nuts or seeds for added healthy fat at each meal. You can add these to yogurt, salads, or vegetable dishes.  Marinate fish or vegetables using olive oil, lemon juice, garlic, and fresh herbs. Meal planning  Plan to eat 1 vegetarian meal one day each week. Try to work up to 2 vegetarian meals, if possible.  Eat seafood 2 or more times a week.  Have healthy snacks readily available, such as: ? Vegetable sticks with hummus. ? [place] yogurt. ? Fruit and nut trail mix.  Eat balanced meals throughout the week. This includes: ? Fruit: 2-3 servings a day ? Vegetables: 4-5 servings a day ? Low-fat dairy: 2 servings a day ? Fish, poultry, or lean meat: 1 serving a day ? Beans and legumes: 2 or more servings a week ? Nuts and seeds: 1-2 servings a day ? Whole grains: 6-8 servings a day ? Extra-virgin olive oil: 3-4 servings a day  Limit red meat and sweets to only a few servings a month   What are my food choices?  Mediterranean diet ? Recommended  Grains: Whole-grain pasta. Brown rice. Bulgar wheat. Polenta. Couscous. Whole-wheat bread. Orpah Cobb.  Vegetables: Artichokes. Beets. Broccoli. Cabbage. Carrots. Eggplant. Green beans. Chard. Kale. Spinach. Onions. Leeks. Peas. Squash. Tomatoes. Peppers. Radishes.  Fruits: Apples. Apricots. Avocado. Berries. Bananas. Cherries. Dates. Figs. Grapes. Lemons. Melon. Oranges. Peaches. Plums. Pomegranate.  Meats and other protein foods: Beans. Almonds. Sunflower seeds. Pine nuts. Peanuts. Cod. Salmon. Scallops. Shrimp. Tuna. Tilapia. Clams. Oysters. Eggs.  Dairy: Low-fat milk. Cheese. Greek yogurt.  Beverages: Water. Red wine. Herbal tea.  Fats and oils: Extra virgin olive oil. Avocado oil. Grape seed oil.  Sweets and desserts: [place] yogurt with honey. Baked apples. Poached pears. Trail mix.  Seasoning and other foods: Basil. Cilantro. Coriander. Cumin. Mint. Parsley. Sage. Rosemary. Tarragon. Garlic.  Oregano. Thyme. Pepper. Balsalmic vinegar. Tahini. Hummus. Tomato sauce. Olives. Mushrooms. ? Limit these  Grains: Prepackaged pasta or rice dishes. Prepackaged cereal with added sugar.  Vegetables: Deep fried potatoes (french fries).  Fruits: Fruit canned in syrup.  Meats and other protein foods: Beef. Pork. Lamb. Poultry with skin. Hot dogs. Tomasa Blase.  Dairy: Ice cream. Sour cream. Whole milk.  Beverages: Juice. Sugar-sweetened soft drinks. Beer. Liquor and spirits.  Fats and oils: Butter. Canola oil. Vegetable oil. Beef fat (tallow). Lard.  Sweets and desserts: Cookies. Cakes. Pies. Candy.  Seasoning and other foods: Mayonnaise. Premade sauces and marinades. The items listed may not be a complete list. Talk with your dietitian about what dietary choices are right for you. Summary  The Mediterranean diet includes both food and lifestyle choices.  Eat a variety of fresh fruits and vegetables, beans, nuts, seeds, and whole grains.  Limit the amount of red meat and sweets that you eat.  Talk with your health care provider about whether it is safe for you to drink red wine in moderation. This means 1 glass a day for nonpregnant women and 2 glasses a day for men. A glass of wine equals 5 oz (  150 mL). This information is not intended to replace advice given to you by your health care provider. Make sure you discuss any questions you have with your health care provider. Document Revised: 01/05/2016 Document Reviewed: 12/29/2015 Elsevier Patient Education  2020 [employer].

## 2020-07-27 ENCOUNTER — Other Ambulatory Visit: Payer: Self-pay

## 2020-07-27 ENCOUNTER — Other Ambulatory Visit: Payer: Medicaid Other

## 2020-07-27 DIAGNOSIS — I255 Ischemic cardiomyopathy: Secondary | ICD-10-CM

## 2020-07-27 LAB — BASIC METABOLIC PANEL
BUN/Creatinine Ratio: 14 (ref 9–20)
BUN: 12 mg/dL (ref 6–24)
CO2: 18 mmol/L — ABNORMAL LOW (ref 20–29)
Calcium: 10.1 mg/dL (ref 8.7–10.2)
Chloride: 92 mmol/L — ABNORMAL LOW (ref 96–106)
Creatinine, Ser: 0.85 mg/dL (ref 0.76–1.27)
Glucose: 396 mg/dL — ABNORMAL HIGH (ref 65–99)
Potassium: 4.6 mmol/L (ref 3.5–5.2)
Sodium: 131 mmol/L — ABNORMAL LOW (ref 134–144)
eGFR: 111 mL/min/{1.73_m2} (ref >59)

## 2020-07-28 ENCOUNTER — Ambulatory Visit (INDEPENDENT_AMBULATORY_CARE_PROVIDER_SITE_OTHER): Payer: Self-pay | Admitting: Physician Assistant

## 2020-07-28 ENCOUNTER — Encounter: Payer: Self-pay | Admitting: Physician Assistant

## 2020-07-28 DIAGNOSIS — F99 Mental disorder, not otherwise specified: Secondary | ICD-10-CM

## 2020-07-28 DIAGNOSIS — F902 Attention-deficit hyperactivity disorder, combined type: Secondary | ICD-10-CM

## 2020-07-28 DIAGNOSIS — F250 Schizoaffective disorder, bipolar type: Secondary | ICD-10-CM

## 2020-07-28 DIAGNOSIS — F411 Generalized anxiety disorder: Secondary | ICD-10-CM

## 2020-07-28 DIAGNOSIS — F5105 Insomnia due to other mental disorder: Secondary | ICD-10-CM

## 2020-07-28 MED ORDER — CLONAZEPAM 1 MG PO TABS: 1.0000 mg | ORAL_TABLET | Freq: Three times a day (TID) | ORAL | 1 refills | Status: DC | PRN

## 2020-07-28 MED ORDER — ATOMOXETINE HCL 40 MG PO CAPS
40.0000 mg | ORAL_CAPSULE | Freq: Every day | ORAL | 1 refills | Status: DC
Start: 2020-07-28 — End: 2020-08-31

## 2020-07-28 NOTE — Progress Notes (Signed)
[facility] Check  Patient ID: Matthew [name],  MRN: [id number]  PCP: [name], MD  Date of Evaluation: 07/28/2020 Time spent:30 minutes  Chief Complaint:  Chief Complaint    Anxiety; Depression; Insomnia      HISTORY/CURRENT STATUS: HPI For routine med check.  Can't concentrate, "I can't pay attention to a movie or anything." Adderall helped in the past. Gets distracted easily. Not working.   Anxiety is better with Gabapentin. Still needs Klonopin for generalized anxiety on a daily basis, and PA several times a week.   Is able to enjoy things when he can focus.  Denies decreased energy or motivation.  Appetite has not changed.  No extreme sadness, tearfulness, or feelings of hopelessness. Sleeps fine with the Trazodone. Denies suicidal or homicidal thoughts.  Patient denies increased energy with decreased need for sleep, no increased talkativeness, no racing thoughts, no impulsivity or risky behaviors, no increased spending, no increased libido, no grandiosity, no increased irritability or anger, no paranoia, and no hallucinations.  Denies dizziness, syncope, seizures, numbness, tingling, tremor, tics, unsteady gait, slurred speech, confusion. Denies muscle or joint pain, stiffness, or dystonia.  Individual Medical History/ Review of Systems: Changes? Valentino Hue  Saw Cardiology 07/21/2020 for f/u NSTEMI. Was started on Spironolactone, Metformin and Jardiance.   Past medications for mental health diagnoses include: Risperdal, Zyprexa, Abilify, [name], Klonopin, Gabapentin, Xanax, Valium, Ativan, Hydroxyzine, Trilafon, Atenolol, Cymbalta, Prozac, Lexapro, Paxil, Lithium, Adderall, Ritalin  Allergies: Gluten meal  Current Medications:  Current Outpatient Medications:  .  aspirin EC 81 MG tablet, Take 1 tablet (81 mg total) by mouth daily. Swallow whole., Disp: 90 tablet, Rfl: 3 .  atomoxetine (STRATTERA) 40 MG capsule, Take 1 capsule (40 mg total) by mouth daily., Disp: 30  capsule, Rfl: 1 .  atorvastatin (LIPITOR) 40 MG tablet, Take 1 tablet (40 mg total) by mouth daily., Disp: 90 tablet, Rfl: 3 .  carvedilol (COREG) 6.25 MG tablet, Take 1 tablet (6.25 mg total) by mouth 2 (two) times daily., Disp: 180 tablet, Rfl: 3 .  DULoxetine (CYMBALTA) 60 MG capsule, TAKE 1 CAPSULE BY MOUTH 2 TIMES DAILY., Disp: 180 capsule, Rfl: 1 .  empagliflozin (JARDIANCE) 10 MG TABS tablet, Take 1 tablet (10 mg total) by mouth daily before breakfast., Disp: 30 tablet, Rfl: 1 .  gabapentin (NEURONTIN) 800 MG tablet, TAKE 1 TABLET BY MOUTH THREE TIMES A DAY, Disp: 270 tablet, Rfl: 1 .  lisinopril (ZESTRIL) 20 MG tablet, Take 1 tablet (20 mg total) by mouth daily., Disp: 90 tablet, Rfl: 3 .  lithium carbonate (LITHOBID) 300 MG CR tablet, TAKE 2 TABLETS (600 MG TOTAL) BY MOUTH AT BEDTIME., Disp: 180 tablet, Rfl: 1 .  nicotine (NICODERM CQ - DOSED IN MG/24 HOURS) 21 mg/24hr patch, Place 1 patch (21 mg total) onto the skin daily., Disp: 28 patch, Rfl: 3 .  perphenazine (TRILAFON) 16 MG tablet, Take 1 pill total 16 mg every morning and take 2 pills by mouth total 32 mg every bedtime, Disp: 270 tablet, Rfl: 1 .  spironolactone (ALDACTONE) 25 MG tablet, Take 0.5 tablets (12.5 mg total) by mouth daily., Disp: 45 tablet, Rfl: 3 .  ticagrelor (BRILINTA) 90 MG TABS tablet, Take 1 tablet (90 mg total) by mouth 2 (two) times daily., Disp: 60 tablet, Rfl: 11 .  traZODone (DESYREL) 100 MG tablet, TAKE 3 TABLETS BY MOUTH EVERY DAY AT BEDTIME AS NEEDED FOR SLEEP, Disp: 270 tablet, Rfl: 1 .  clonazePAM (KLONOPIN) 1 MG tablet, Take 1 tablet (1  mg total) by mouth 3 (three) times daily as needed for anxiety., Disp: 90 tablet, Rfl: 1 .  metFORMIN (GLUCOPHAGE) 1000 MG tablet, Take 1 tablet (1,000 mg total) by mouth 2 (two) times daily with a meal., Disp: 60 tablet, Rfl: 0 .  nicotine polacrilex (NICORETTE) 2 MG gum, Take 1 each (2 mg total) by mouth as needed for smoking cessation. (Patient not taking: Reported on  07/28/2020), Disp: 100 tablet, Rfl: 0 Medication Side Effects: none  Family Medical/ Social History: Changes?  See HPI  MENTAL HEALTH EXAM:  There were no vitals taken for this visit.There is no height or weight on file to calculate BMI.  General Appearance: Casual and Guarded  Eye Contact:  Fair  Speech:  Clear and Coherent and Normal Rate  Volume:  Normal  Mood:  Anxious  Affect:  Flat and Anxious  Thought Process:  Goal Directed and Descriptions of Associations: Intact  Orientation:  Full (Time, Place, and Person)  Thought Content: Logical   Suicidal Thoughts:  No  Homicidal Thoughts:  No  Memory:  WNL  Judgement:  Poor  Insight:  Fair  Psychomotor Activity:  Restlessness and paces the floor during the entire appt  Concentration:  Concentration: Poor and Attention Span: Fair  Recall:  Good  Fund of Knowledge: Good  Language: Good  Assets:  Desire for Improvement  ADL's:  Intact  Cognition: WNL  Prognosis:  Fair   Most recent pertinent labs done in hospital: 06/07/2020 BMP Glucose 263, BUN 11, Creatinine 1.17 06/06/2020 Chol 717, Trig 2,311  Lithium level 0.22 TSH 2.644   DIAGNOSES:    ICD-10-CM   1. Schizoaffective disorder, bipolar type without good prognostic features (HCC)  F25.0   2. Attention deficit hyperactivity disorder (ADHD), combined type, moderate  F90.2   3. Generalized anxiety disorder  F41.1   4. Insomnia due to other mental disorder  F51.05    F99     Receiving Psychotherapy: No    RECOMMENDATIONS:  PDMP was reviewed. I provided 30 minutes of face to face time during this encounter, including time spent in record review and charting. Discussed options for ADD sx. Again, no stimulants. Recent MI. Other options Strattera, Qelbree, Intuniv or Kapvay. Benefits, risks and SE of each was given. Recommend Strattera.  Start Strattera 40 mg, 1 p.o. every morning. Continue Klonopin 1 mg, 1 po tid prn. Continue Cymbalta 60 mg, 1 po bid. Continue  Gabapentin 800 mg, 1 po tid. Continue Lithium ER 300 mg, 2 po qhs. Continue [name] 16 mg, 1 po q am, 2 po qhs. Continue Trazodone 100 mg, 3 qhs prn sleep. Recommend counseling.  Return in 2 months.   Melony Overly, PA-C

## 2020-08-05 ENCOUNTER — Telehealth: Payer: Self-pay

## 2020-08-05 MED ORDER — METFORMIN HCL 1000 MG PO TABS: 1000.0000 mg | ORAL_TABLET | Freq: Two times a day (BID) | ORAL | 0 refills | Status: DC

## 2020-08-05 NOTE — Telephone Encounter (Signed)
-----   Message from Meriam Sprague, MD sent at 07/27/2020  9:02 PM EST ----- His electrolytes and kidney function look good. His glucose was very elevated. Can we increase his metformin to 1000mg  BID and ensure he has follow-up with Endocrinology?

## 2020-08-05 NOTE — Telephone Encounter (Signed)
Reviewed results with patient who verbalized understanding.   Instructed the patient to INCREASE METFORMIN to 1000 mg BID. Confirmed he has appointment with Endocrinology 08/15/20. He was grateful for call and agrees with plan.

## 2020-08-08 ENCOUNTER — Telehealth: Payer: Self-pay | Admitting: Physician Assistant

## 2020-08-08 NOTE — Telephone Encounter (Signed)
Pt called and wants to increase his klonopin to 2  Mg. Please send new script to [facility] at [address]. Please call him at [phone]

## 2020-08-09 ENCOUNTER — Telehealth (HOSPITAL_COMMUNITY): Payer: Self-pay

## 2020-08-09 NOTE — Telephone Encounter (Signed)
Called and spoke with pt in regards to CR, adv pt we needed to change his orientation appt time to 9:30AM on 3/29. Patient verbalized understanding.

## 2020-08-10 ENCOUNTER — Other Ambulatory Visit: Payer: Self-pay

## 2020-08-10 ENCOUNTER — Emergency Department (HOSPITAL_COMMUNITY): Payer: Medicaid Other

## 2020-08-10 ENCOUNTER — Emergency Department (HOSPITAL_COMMUNITY)
Admission: EM | Admit: 2020-08-10 | Discharge: 2020-08-10 | Disposition: A | Discharge status: Home / Self Care | Payer: Medicaid Other | Attending: Emergency Medicine | Admitting: Emergency Medicine

## 2020-08-10 ENCOUNTER — Encounter (HOSPITAL_COMMUNITY): Payer: Self-pay

## 2020-08-10 DIAGNOSIS — I5022 Chronic systolic (congestive) heart failure: Secondary | ICD-10-CM

## 2020-08-10 DIAGNOSIS — Z20822 Contact with and (suspected) exposure to covid-19: Secondary | ICD-10-CM | POA: Diagnosis not present

## 2020-08-10 DIAGNOSIS — Z7982 Long term (current) use of aspirin: Secondary | ICD-10-CM | POA: Insufficient documentation

## 2020-08-10 DIAGNOSIS — Z87891 Personal history of nicotine dependence: Secondary | ICD-10-CM | POA: Diagnosis not present

## 2020-08-10 DIAGNOSIS — I10 Essential (primary) hypertension: Secondary | ICD-10-CM | POA: Diagnosis not present

## 2020-08-10 DIAGNOSIS — E119 Type 2 diabetes mellitus without complications: Secondary | ICD-10-CM | POA: Insufficient documentation

## 2020-08-10 DIAGNOSIS — R0789 Other chest pain: Secondary | ICD-10-CM

## 2020-08-10 DIAGNOSIS — I2510 Atherosclerotic heart disease of native coronary artery without angina pectoris: Secondary | ICD-10-CM

## 2020-08-10 DIAGNOSIS — Z79899 Other long term (current) drug therapy: Secondary | ICD-10-CM | POA: Diagnosis not present

## 2020-08-10 DIAGNOSIS — Z7984 Long term (current) use of oral hypoglycemic drugs: Secondary | ICD-10-CM | POA: Diagnosis not present

## 2020-08-10 DIAGNOSIS — R072 Precordial pain: Secondary | ICD-10-CM | POA: Diagnosis not present

## 2020-08-10 LAB — TROPONIN I (HIGH SENSITIVITY)
Troponin I (High Sensitivity): 4 ng/L (ref <18)
Troponin I (High Sensitivity): 3 ng/L (ref <18)

## 2020-08-10 LAB — BASIC METABOLIC PANEL
Anion gap: 13 (ref 5–15)
BUN: 11 mg/dL (ref 6–20)
CO2: 19 mmol/L — ABNORMAL LOW (ref 22–32)
Calcium: 9.7 mg/dL (ref 8.9–10.3)
Chloride: 99 mmol/L (ref 98–111)
Creatinine, Ser: 1.15 mg/dL (ref 0.61–1.24)
GFR, Estimated: >60 mL/min (ref >60)
Glucose, Bld: 199 mg/dL — ABNORMAL HIGH (ref 70–99)
Potassium: 4.2 mmol/L (ref 3.5–5.1)
Sodium: 131 mmol/L — ABNORMAL LOW (ref 135–145)

## 2020-08-10 LAB — CBC
HCT: 50.4 % (ref 39.0–52.0)
Hemoglobin: 17 g/dL (ref 13.0–17.0)
MCH: 28.2 pg (ref 26.0–34.0)
MCHC: 33.7 g/dL (ref 30.0–36.0)
MCV: 83.6 fL (ref 80.0–100.0)
Platelets: 276 10*3/uL (ref 150–400)
RBC: 6.03 MIL/uL — ABNORMAL HIGH (ref 4.22–5.81)
RDW: 14.1 % (ref 11.5–15.5)
WBC: 14.6 10*3/uL — ABNORMAL HIGH (ref 4.0–10.5)
nRBC: 0 % (ref 0.0–0.2)

## 2020-08-10 LAB — PROTIME-INR
INR: 0.9 (ref 0.8–1.2)
Prothrombin Time: 11.6 seconds (ref 11.4–15.2)

## 2020-08-10 LAB — RESP PANEL BY RT-PCR (FLU A&B, COVID) ARPGX2
Influenza A by PCR: NEGATIVE
Influenza B by PCR: NEGATIVE
SARS Coronavirus 2 by RT PCR: NEGATIVE

## 2020-08-10 IMAGING — DX DG CHEST 1V PORT
2 series · 2 of 2 positions shown · non-contrast
Comparison: Chest x-ray [DATE].

CLINICAL DATA: 44-year-old male with history of 3 days of chest
pain with radiation to the back.

EXAM:
PORTABLE CHEST 1 VIEW

[chest ap (1 of 2)]
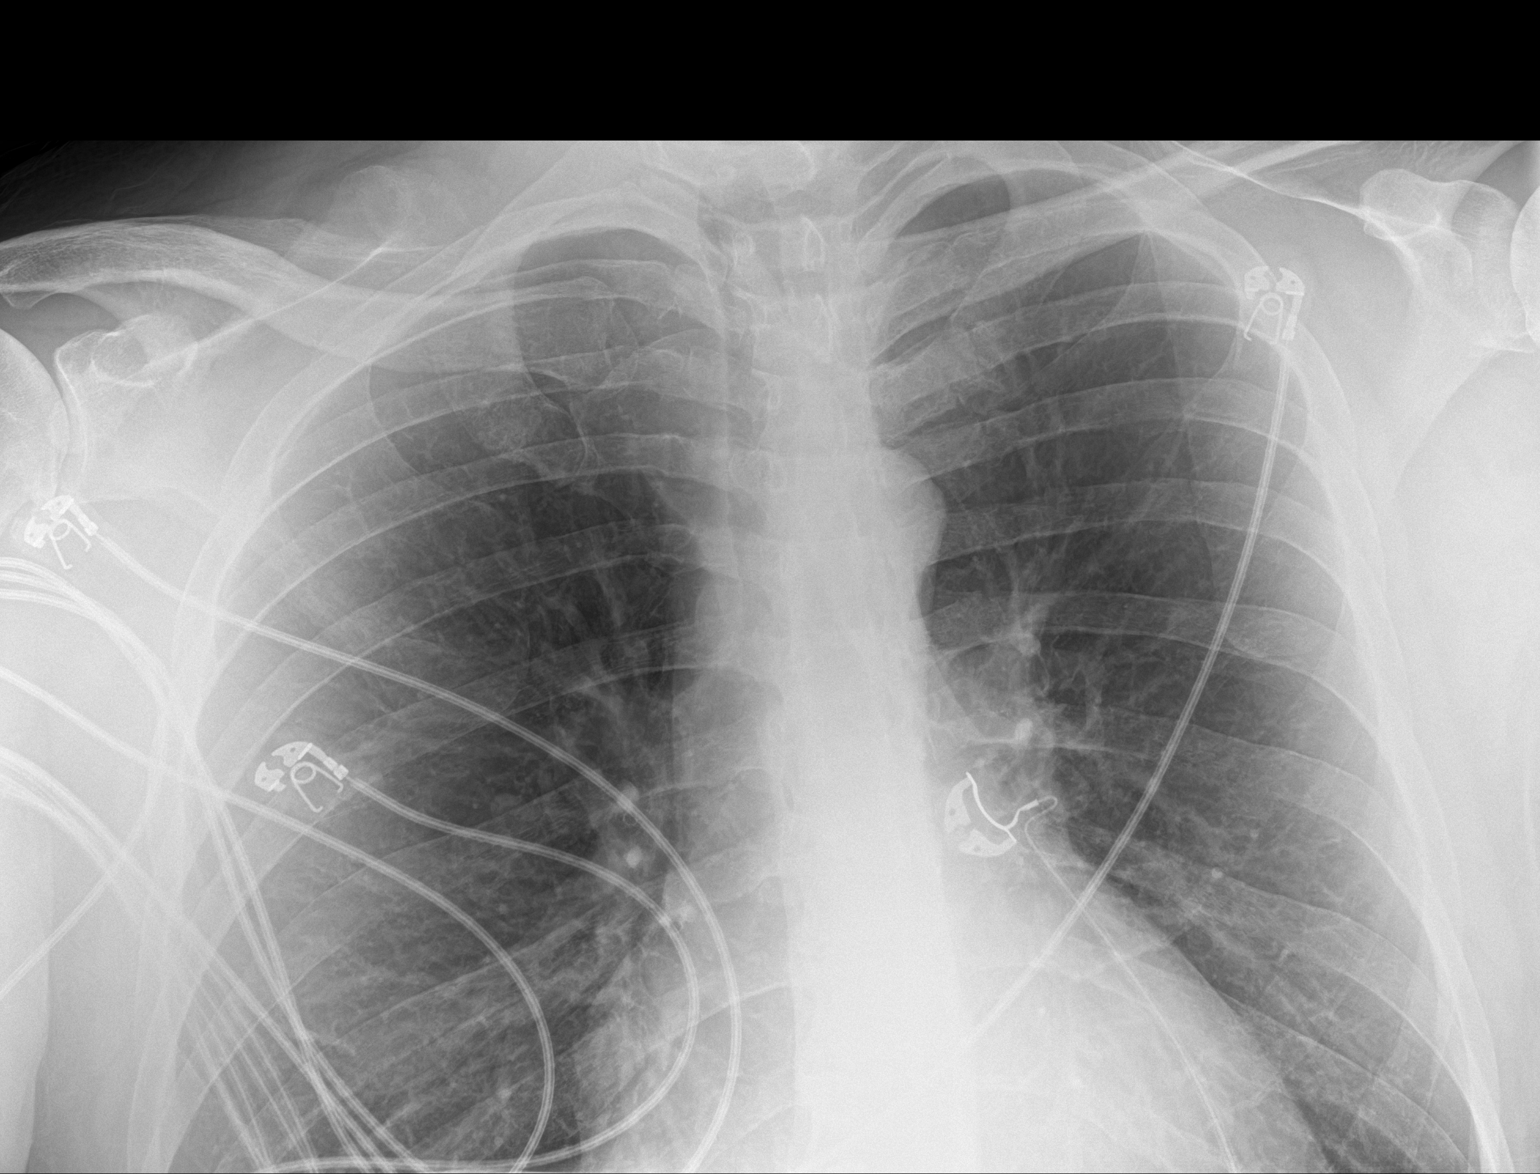

[chest ap (2 of 2)]
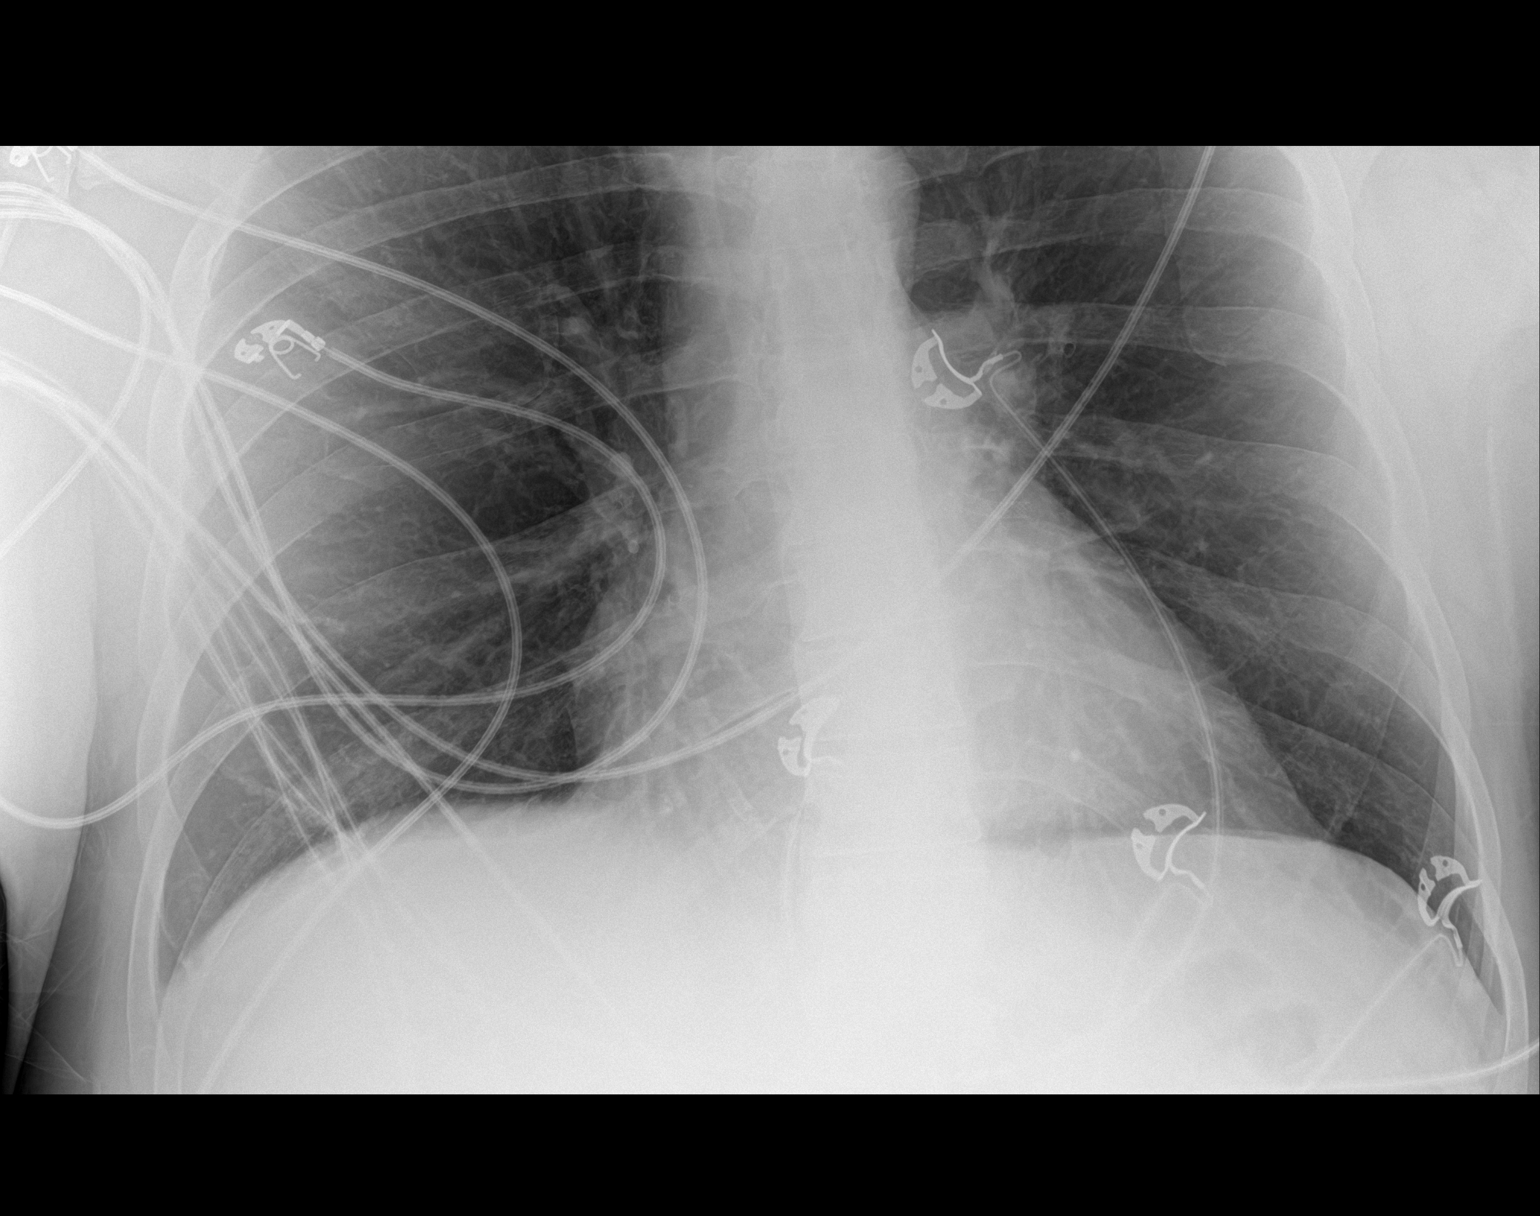

[2 of 2 positions shown; findings below may reference images not displayed]

FINDINGS: Lung volumes are normal. No consolidative airspace disease. No
pleural effusions. No pneumothorax. No pulmonary nodule or mass
noted. Pulmonary vasculature and the cardiomediastinal silhouette
are within normal limits.
IMPRESSION: No radiographic evidence of acute cardiopulmonary disease.

## 2020-08-10 MED ORDER — KETOROLAC TROMETHAMINE 15 MG/ML IJ SOLN
15.0000 mg | Freq: Once | INTRAMUSCULAR | Status: AC
  Administered 2020-08-10: 15 mg via INTRAVENOUS
  Filled 2020-08-10: qty 1

## 2020-08-10 MED ORDER — NITROGLYCERIN 0.4 MG SL SUBL: 0.4000 mg | SUBLINGUAL_TABLET | SUBLINGUAL | 0 refills | Status: DC | PRN

## 2020-08-10 MED ORDER — ISOSORBIDE MONONITRATE ER 30 MG PO TB24: 15.0000 mg | ORAL_TABLET | Freq: Every day | ORAL | 0 refills | Status: DC

## 2020-08-10 MED ORDER — LORAZEPAM 2 MG/ML IJ SOLN
0.5000 mg | Freq: Once | INTRAMUSCULAR | Status: AC
  Administered 2020-08-10: 0.5 mg via INTRAVENOUS
  Filled 2020-08-10: qty 1

## 2020-08-10 MED ORDER — ISOSORBIDE MONONITRATE ER 30 MG PO TB24
15.0000 mg | ORAL_TABLET | Freq: Every day | ORAL | Status: DC
  Administered 2020-08-10: 15 mg via ORAL
  Filled 2020-08-10: qty 1

## 2020-08-10 MED ORDER — MORPHINE SULFATE (PF) 4 MG/ML IV SOLN
4.0000 mg | Freq: Once | INTRAVENOUS | Status: AC
  Administered 2020-08-10: 4 mg via INTRAVENOUS
  Filled 2020-08-10: qty 1

## 2020-08-10 MED ORDER — NITROGLYCERIN 0.4 MG SL SUBL: 0.4000 mg | SUBLINGUAL_TABLET | SUBLINGUAL | 3 refills | Status: DC | PRN

## 2020-08-10 NOTE — Discharge Instructions (Signed)
Please return for any problem.  °

## 2020-08-10 NOTE — Consult Note (Addendum)
The patient has been seen in conjunction with Matthew Chard, NP-C. All aspects of care have been considered and discussed. The patient has been personally interviewed, examined, and all clinical data has been reviewed.   Atypical chest and periumbilical pain.  Discomfort has been intermittent for the past 72 hours.  It is dissimilar to prior anginal discomfort.  He has not tried nitroglycerin for the discomfort.  Recommend cycling cardiac markers and if there is no significant change, patient should follow-up with primary cardiologist.  If there is a significant change in troponin, we will admit.  I reinforced the importance of taking dual antiplatelet therapy and heart failure therapy as recommended without missing doses.  He should have a prescription for sublingual nitroglycerin and use it sublingually if prolonged chest discomfort to determine if there is benefit.    Cardiology Consultation:   Patient ID: Matthew [name]; [id number]; 02/08/1976   Admit date: 08/10/2020 Date of Consult: 08/10/2020  Primary Care Provider: [name], MD Primary Cardiologist: Dr. Shari Prows, MD   Patient Profile:   Matthew [name] is a 45 y.o. male with a hx of HTN, HLD, uncontrolled DM2, celiac disease, schizoaffective d/o, anxiety, ADHD, OSAnot on CPAP, and recent NSTEMI s/p DES to pLAD and DES to [facility] and ongoing tobacco use who presented to [facility] 08/10/20 with chest pain 08/10/20.   History of Present Illness:   [name] is a 45yo M with a hx as stated above who presented to [facility] 08/10/20 with a several day hx of chest pain which is described as a heaviness that has been intermittent in duration and worse with laying down however he also reveals that it has worsened with exertion as he has been helping his mother due to a recent stroke with right sided deficit. He denies associated SOB, LE edema, palpitations, orthopnea, nausea/vominting or diaphoresis. Does report radiation of pain to his mid back  area and also describes lower abdominal pain that has been occurring around the same time. He denies recent nausea or vomiting. No bleeding in stool or urine. No recent illness with fever or chills. He reports complete compliance with his medications. He states symptoms this episode are not similar to symptoms he suffered with NSTEMI 05/2020.    In the ED, multiple EKG tracings performed that show ST with rates in the low 100's with evidence of old anterior MI and appears to have inferior early repolarization in lead III which looks to be more pronounced. CXR with no active cardiopulmomary disease. Creatinine stable at 1.15. Sodium remains low>>chronic. Glucose elevated at 199. HsT found to be 4. Plan to follow repeat. WBC up at 14.6 but appears to be chronically elevated as well.   He was initally admitted 06/06/20 with NSTEMI treated with DES to pLAD and DES to dRCA. Echo at that time showed LVEF at 25-30%. He unfortunately left AMA 06/07/20 and was urgently in the OP setting by Herma Carson, PA-C. At that time he was found to be more SOB and had been rather non-compliant with his sodium intake by eating fast food. He had not filled his prescriptions for atenolol and lisinopril.   He was last seen in close follow up with Dr. Shari Prows 07/20/20 at which time he reports complete compliance with his medications. He wore a cardiac monitor that showed predominant NSR with rare VE's and VE couplets, otherwise was stable. He did have reports of occasional chest discomfort at rest that was described as a soreness/tightness that would last approximately  1 hour then would resolve on its own.   Past Medical History:  Diagnosis Date  . Anxiety   . Celiac disease   . Dairy allergy   . Depression   . High cholesterol   . Hypertension   . Paranoia (HCC)   . Pre-diabetes   . Schizoaffective disorder [facility])     Past Surgical History:  Procedure Laterality Date  . APPENDECTOMY    . BRAIN SURGERY    .  CORONARY STENT INTERVENTION N/A 06/06/2020   Procedure: CORONARY STENT INTERVENTION;  Surgeon: Swaziland, Peter M, MD;  Location: [facility] INVASIVE CV LAB;  Service: Cardiovascular;  Laterality: N/A;  prox LAD, distal RCA  . INTRAVASCULAR ULTRASOUND/IVUS N/A 06/06/2020   Procedure: Intravascular Ultrasound/IVUS;  Surgeon: Swaziland, Peter M, MD;  Location: [facility] INVASIVE CV LAB;  Service: Cardiovascular;  Laterality: N/A;  . LEFT HEART CATH AND CORONARY ANGIOGRAPHY N/A 06/06/2020   Procedure: LEFT HEART CATH AND CORONARY ANGIOGRAPHY;  Surgeon: Swaziland, Peter M, MD;  Location: [facility] INVASIVE CV LAB;  Service: Cardiovascular;  Laterality: N/A;  . NASAL SEPTUM SURGERY    . WRIST FRACTURE SURGERY     right wrist     Prior to Admission medications   Medication Sig Start Date End Date Taking? Authorizing Provider  aspirin EC 81 MG tablet Take 1 tablet (81 mg total) by mouth daily. Swallow whole. 06/07/20  Yes Meriam Sprague, MD  atorvastatin (LIPITOR) 40 MG tablet Take 1 tablet (40 mg total) by mouth daily. 06/22/20 09/20/20 Yes Meriam Sprague, MD  carvedilol (COREG) 6.25 MG tablet Take 1 tablet (6.25 mg total) by mouth 2 (two) times daily. 06/22/20  Yes Meriam Sprague, MD  DULoxetine (CYMBALTA) 60 MG capsule TAKE 1 CAPSULE BY MOUTH 2 TIMES DAILY. 03/29/20  Yes Chauncey Mann, MD  empagliflozin (JARDIANCE) 10 MG TABS tablet Take 1 tablet (10 mg total) by mouth daily before breakfast. 07/20/20 09/18/20 Yes Meriam Sprague, MD  gabapentin (NEURONTIN) 800 MG tablet TAKE 1 TABLET BY MOUTH THREE TIMES A DAY 03/29/20  Yes Chauncey Mann, MD  lisinopril (ZESTRIL) 20 MG tablet Take 1 tablet (20 mg total) by mouth daily. 06/08/20  Yes Dyann Kief, PA-C  metFORMIN (GLUCOPHAGE) 1000 MG tablet Take 1 tablet (1,000 mg total) by mouth 2 (two) times daily with a meal. 08/05/20 09/04/20 Yes Pemberton, Kathlynn Grate, MD  nicotine (NICODERM CQ - DOSED IN MG/24 HOURS) 21 mg/24hr patch Place 1 patch (21 mg total) onto the skin  daily. 07/20/20  Yes Meriam Sprague, MD  perphenazine (TRILAFON) 16 MG tablet Take 1 pill total 16 mg every morning and take 2 pills by mouth total 32 mg every bedtime 03/29/20  Yes Chauncey Mann, MD  spironolactone (ALDACTONE) 25 MG tablet Take 0.5 tablets (12.5 mg total) by mouth daily. 07/20/20 07/15/21 Yes Meriam Sprague, MD  ticagrelor (BRILINTA) 90 MG TABS tablet Take 1 tablet (90 mg total) by mouth 2 (two) times daily. 06/07/20  Yes Meriam Sprague, MD  traZODone (DESYREL) 100 MG tablet TAKE 3 TABLETS BY MOUTH EVERY DAY AT BEDTIME AS NEEDED FOR SLEEP Patient taking differently: Take 300 mg by mouth at bedtime. TAKE 3 TABLETS BY MOUTH EVERY DAY AT BEDTIME 03/29/20  Yes Chauncey Mann, MD  atomoxetine (STRATTERA) 40 MG capsule Take 1 capsule (40 mg total) by mouth daily. Patient not taking: No sig reported 07/28/20   Melony Overly T, PA-C  clonazePAM (KLONOPIN) 1 MG tablet Take 1 tablet (  1 mg total) by mouth 3 (three) times daily as needed for anxiety. 07/28/20   Melony Overly T, PA-C  lithium carbonate (LITHOBID) 300 MG CR tablet TAKE 2 TABLETS (600 MG TOTAL) BY MOUTH AT BEDTIME. Patient not taking: Reported on 08/10/2020 03/29/20   Chauncey Mann, MD  nicotine polacrilex (NICORETTE) 2 MG gum Take 1 each (2 mg total) by mouth as needed for smoking cessation. Patient not taking: No sig reported 02/11/15   Sanjuana Kava, NP    Inpatient Medications: Scheduled Meds:  Continuous Infusions:  PRN Meds:   Allergies:    Allergies  Allergen Reactions  . Gluten Meal Other (See Comments)    Celiac disease    Social History:   Social History   Socioeconomic History  . Marital status: Single    Spouse name: Not on file  . Number of children: 0  . Years of education: Not on file  . Highest education level: Not on file  Occupational History  . Occupation: [employer], unemployed  Tobacco Use  . Smoking status: Former Smoker    Packs/day: 0.00    Types: Cigarettes  .  Smokeless tobacco: Never Used  . Tobacco comment: quit 07/21/2020  Vaping Use  . Vaping Use: Never used  Substance and Sexual Activity  . Alcohol use: No    Alcohol/week: 0.0 standard drinks    Comment: no (02/08/15)  . Drug use: No  . Sexual activity: Not on file  Other Topics Concern  . Not on file  Social History Narrative   Pt lives at the [facility]   Social Determinants of Health   Financial Resource Strain: Not on file  Food Insecurity: Not on file  Transportation Needs: Not on file  Physical Activity: Not on file  Stress: Not on file  Social Connections: Not on file  Intimate Partner Violence: Not on file    Family History:   Family History  Problem Relation Age of Onset  . Asthma Mother   . Hypertension Mother   . Hypertension Father    Family Status:  Family Status  Relation Name Status  . Mother  Alive  . Father  Alive    ROS:  Please see the history of present illness.  All other ROS reviewed and negative.     Physical Exam/Data:   Vitals:   08/10/20 1116 08/10/20 1159 08/10/20 1200 08/10/20 1230  BP: (!) 139/92 (!) 143/98 (!) 143/98 (!) 136/92  Pulse: (!) 117 (!) 108 (!) 114 (!) 103  Resp: Temp: 97.6 F (36.4 C)     SpO2: 96% 97% 98% 95%   No intake or output data in the 24 hours ending 08/10/20 1340 There were no vitals filed for this visit. There is no height or weight on file to calculate BMI.   General: Well developed, well nourished, NAD Neck: Negative for carotid bruits. No JVD Lungs:Clear to ausculation bilaterally. No wheezes, rales, or rhonchi. Breathing is unlabored. Cardiovascular: RRR with S1 S2. No murmurs Abdomen: Soft, non-tender, non-distended. No obvious abdominal masses. Extremities: No edema. Radial pulses 2+ bilaterally Neuro: Alert and oriented. No focal deficits. No facial asymmetry. MAE spontaneously. Psych: Responds to questions appropriately with normal affect.    EKG:  The ECG that was done  08/10/20 was personally reviewed and demonstrates ST with HR at 116bpm with evidence of old anterior MI and early repolarization in lead III.  Relevant CV Studies:   Left Heart Cath 06/06/2020: 1.  Severe 2 vessel obstructive CAD - 99% proximal LAD - 90% distal RCA 2. Severe LV dysfunction. EF estimated at 25-30%. Segmental wall motion abnormalities. 3. Moderately elevated LVEDP 4. Successful PCI of the proximal LAD with IVUS guidance and DES x 1 5. Successful PCI of the distal RCA with IVUS guidance and DES x 1  Diagnostic Dominance: Right    Intervention       Cardiac monitor 07/19/20: Patch Wear Time: 7 days and 17 hours (2022-02-12T16:36:22-0500 to 2022-02-20T10:06:03-499)  Patient had a min HR of 55 bpm, max HR of 135 bpm, and avg HR of 88 bpm. Predominant underlying rhythm was Sinus Rhythm. No Isolated SVEs, SVE Couplets, or SVE Triplets were present. Isolated VEs were rare (<1.0%), VE Couplets were rare (<1.0%), and no  VE Triplets were present.  Laboratory Data:  Chemistry Recent Labs  Lab 08/10/20 1155  NA 131*  K 4.2  CL 99  CO2 19*  GLUCOSE 199*  BUN 11  CREATININE 1.15  CALCIUM 9.7  GFRNONAA >60  ANIONGAP 13    Total Protein  Date Value Ref Range Status  10/21/2018 7.2 6.5 - 8.1 g/dL Final   Albumin  Date Value Ref Range Status  06/07/2020 3.8 3.5 - 5.0 g/dL Final    Comment:    Performed at [facility], [address], [place], [place] [address]   AST  Date Value Ref Range Status  10/21/2018 24 15 - 41 U/L Final   ALT  Date Value Ref Range Status  10/21/2018 46 (H) 0 - 44 U/L Final   Alkaline Phosphatase  Date Value Ref Range Status  10/21/2018 75 38 - 126 U/L Final   Total Bilirubin  Date Value Ref Range Status  10/21/2018 0.4 0.3 - 1.2 mg/dL Final   Hematology Recent Labs  Lab 08/10/20 1155  WBC 14.6*  RBC 6.03*  HGB 17.0  HCT 50.4  MCV 83.6  MCH 28.2  MCHC 33.7  RDW 14.1  PLT 276    Cardiac EnzymesNo results for input(s): TROPONINI in the last 168 hours. No results for input(s): TROPIPOC in the last 168 hours.  BNPNo results for input(s): BNP, PROBNP in the last 168 hours.  DDimer No results for input(s): DDIMER in the last 168 hours. TSH:  Lab Results  Component Value Date   TSH 2.644 06/06/2020   Lipids: Lab Results  Component Value Date   CHOL 717 (H) 06/06/2020   HDL NOT REPORTED DUE TO HIGH TRIGLYCERIDES 06/06/2020   LDLCALC UNABLE TO CALCULATE IF TRIGLYCERIDE OVER 400 mg/dL 33/82/5053   LDLDIRECT 51.0 06/06/2020   TRIG 2,311 (H) 06/06/2020   CHOLHDL NOT REPORTED DUE TO HIGH TRIGLYCERIDES 06/06/2020   HgbA1c: Lab Results  Component Value Date   HGBA1C 12.8 (H) 06/06/2020    Radiology/Studies:  DG Chest Portable 1 View  Result Date: 08/10/2020 CLINICAL DATA:  45 year old male with history of 3 days of chest pain with radiation to the back. EXAM: PORTABLE CHEST 1 VIEW COMPARISON:  Chest x-ray 06/07/2020. FINDINGS: Lung volumes are normal. No consolidative airspace disease. No pleural effusions. No pneumothorax. No pulmonary nodule or mass noted. Pulmonary vasculature and the cardiomediastinal silhouette are within normal limits. IMPRESSION: No radiographic evidence of acute cardiopulmonary disease. Electronically Signed   By: Trudie Reed M.D.   On: 08/10/2020 12:06    Assessment and Plan:    1. Chest pain with known hx of CAD s/p recent PCI to LAD/RCA 05/2020: -Pt presented with vague chest pain symptoms  described as a heaviness with radiation to his back that has been intermittent in duration and worse with laying down however he also reveals that it has worsened with exertion as he has been helping his mother due to a recent stroke with right sided deficit. Also describes lower abdominal pain that has been occurring around the same time with no associated symptoms. Reports complete compliance with his medications and symptoms not similar to prior  MI in 05/2020.   -Multiple EKG tracings performed that show ST with rates in the low 100's with evidence of old anterior MI and appears to have inferior early repolarization in lead III which looks to be more pronounced.  -Initial HsT normal at 4>>continue to trend  -CXR with no active cardiopulmomary disease -Underwent coronary angiography 05/2020 which revealed99% proximal LADand90% distal RCAboth of which were treated successfuly with DES with no other disease noted.  -Low suspicion for in-stent restenosis with no significant EKG changes and noraml HsT so far. Pain is not reminiscent of prior MI. -Would continue to optimize his medications at this time. Would add long acting nitrates and follow HsT trend. Needs better risk factor control.  -On home ASA, Brilinta, lisinopril20mg  daily>>with plans to transition to Entresto, Coreg6.25mg  BID, Lipitor to 40mg  daily, Spironolactone 12.5mg  daily. Jardiance added at last OV -If HsT stay flat with no recurrent symptoms, pt can be discharged home with close follow up with cardiology. Would add Imdur 15mg  PO QD to regimen. With no HsT bump, no significant EKG changes and inconsistent chest pain symptoms no clear indication for further ischemic workup at this time.   2. Ischemic Cardiomyopathy: -Last echo 05/2020 with LVEF at 25-30% -Does not appear to be fluid volume overloaded on exam today  -Continue Lisinopril>>[facility] remains with poor LV function -ContinueCoreg, spironolactone 12.5mg  daily and Jardiance 10mg  daily   3. Uncontrolled DM2: -HbA1c, 12.8 from 06/06/20 -Remains on home Metformin, Jardiance -recently referred to endocrinology   4. OSA: -Reports he has not followed with pulmonary in a long time>>>never received CPAP machine. Discussed potential for cards to evaluate this for him.   5. Tobacco use: -Cessation strongly encouraged -Utilizes nicotine patch  6. Periumbilical  abdominal pain: -Unclear etiology. No reports of blood in stool or urine. No recent nausea, vomiting or stool changes. Has a hx of celiac disease.  -Needs PCP follow up   For questions or updates, please contact [facility] Please consult [email or web address] for contact info under Cardiology/STEMI.   SignedGeorgie [name], Jill McDaniel NP-C [facility] Pager: [phone] 08/10/2020 1:40 PM

## 2020-08-10 NOTE — ED Triage Notes (Signed)
Patient complains of 3 days of chest pain with radiation to back, no nausea and no vomiting, had recent MI with 2 stents last month. Patient hx of diabetes and HTN.

## 2020-08-10 NOTE — ED Notes (Signed)
ED Provider at bedside. 

## 2020-08-11 ENCOUNTER — Other Ambulatory Visit: Payer: Self-pay

## 2020-08-11 ENCOUNTER — Telehealth (HOSPITAL_COMMUNITY): Payer: Self-pay | Admitting: Pharmacist

## 2020-08-11 NOTE — ED Provider Notes (Signed)
[facility] EMERGENCY DEPARTMENT Provider Note   CSN: [id number] Arrival date & time: 08/10/20  1111     History No chief complaint on file.   Matthew [name] is a 45 y.o. male.  45 year old male with prior medical history as detailed below presents for evaluation of chest pain. He reports 3 days of near constant substernal chest discomfort. He reports achy pain. He denies associated nausea, vomiting, diaphoresis, or shortness of breath.  Of note, patient with recent NSTEMI and stent placement (RCA and LAD) in January 2022. Patient known to [facility].  He reports compliance with ASA and Brilinta.   The history is provided by the patient and medical records.  Chest Pain Pain location:  Substernal area Pain quality: aching   Pain radiates to:  Does not radiate Pain severity:  Mild Onset quality:  Gradual Duration:  3 days Timing:  Constant Progression:  Waxing and waning Chronicity:  New Relieved by:  Nothing Worsened by:  Nothing      Past Medical History:  Diagnosis Date  . Anxiety   . Celiac disease   . Dairy allergy   . Depression   . High cholesterol   . Hypertension   . Paranoia (HCC)   . Pre-diabetes   . Schizoaffective disorder [facility])     Patient Active Problem List   Diagnosis Date Noted  . Chest pain 06/06/2020  . Type 2 diabetes mellitus with hyperglycemia (HCC) 06/06/2020  . Hyperkalemia 06/06/2020  . Thrombocytopenia (HCC) 06/06/2020  . Obesity (BMI 30.0-34.9) 06/06/2020  . Non-ST elevation (NSTEMI) myocardial infarction (HCC)   . Attention deficit hyperactivity disorder (ADHD), combined type, moderate 03/10/2018  . Generalized anxiety disorder 03/10/2018  . Schizoaffective disorder, bipolar type without good prognostic features (HCC) 02/09/2015  . Morbid obesity (HCC) 01/18/2015  . OSA (obstructive sleep apnea) 07/14/2014  . Solitary pulmonary nodule 07/14/2014    Past Surgical History:  Procedure Laterality Date  .  APPENDECTOMY    . BRAIN SURGERY    . CORONARY STENT INTERVENTION N/A 06/06/2020   Procedure: CORONARY STENT INTERVENTION;  Surgeon: Swaziland, Cobe Viney M, MD;  Location[facility] INVASIVE CV LAB;  Service: Cardiovascular;  Laterality: N/A;  prox LAD, distal RCA  . INTRAVASCULAR ULTRASOUND/IVUS N/A 06/06/2020   Procedure: Intravascular Ultrasound/IVUS;  Surgeon: Swaziland, Felice Deem M, MD;  Location: [facility] INVASIVE CV LAB;  Service: Cardiovascular;  Laterality: N/A;  . LEFT HEART CATH AND CORONARY ANGIOGRAPHY N/A 06/06/2020   Procedure: LEFT HEART CATH AND CORONARY ANGIOGRAPHY;  Surgeon: Swaziland, Cecilio Ohlrich M, MD;  Location: [facility] INVASIVE CV LAB;  Service: Cardiovascular;  Laterality: N/A;  . NASAL SEPTUM SURGERY    . WRIST FRACTURE SURGERY     right wrist       Family History  Problem Relation Age of Onset  . Asthma Mother   . Hypertension Mother   . Hypertension Father     Social History   Tobacco Use  . Smoking status: Former Smoker    Packs/day: 0.00    Types: Cigarettes  . Smokeless tobacco: Never Used  . Tobacco comment: quit 07/21/2020  Vaping Use  . Vaping Use: Never used  Substance Use Topics  . Alcohol use: No    Alcohol/week: 0.0 standard drinks    Comment: no (02/08/15)  . Drug use: No    Home Medications Prior to Admission medications   Medication Sig Start Date End Date Taking? Authorizing Provider  aspirin EC 81 MG tablet Take 1 tablet (81 mg  total) by mouth daily. Swallow whole. 06/07/20  Yes Meriam Sprague, MD  atorvastatin (LIPITOR) 40 MG tablet Take 1 tablet (40 mg total) by mouth daily. 06/22/20 09/20/20 Yes Meriam Sprague, MD  carvedilol (COREG) 6.25 MG tablet Take 1 tablet (6.25 mg total) by mouth 2 (two) times daily. 06/22/20  Yes Meriam Sprague, MD  DULoxetine (CYMBALTA) 60 MG capsule TAKE 1 CAPSULE BY MOUTH 2 TIMES DAILY. 03/29/20  Yes Chauncey Mann, MD  empagliflozin (JARDIANCE) 10 MG TABS tablet Take 1 tablet (10 mg total) by mouth daily before breakfast. 07/20/20  09/18/20 Yes Meriam Sprague, MD  gabapentin (NEURONTIN) 800 MG tablet TAKE 1 TABLET BY MOUTH THREE TIMES A DAY 03/29/20  Yes Chauncey Mann, MD  isosorbide mononitrate (IMDUR) 30 MG 24 hr tablet Take 0.5 tablets (15 mg total) by mouth daily. 08/10/20  Yes Wynetta Fines, MD  lisinopril (ZESTRIL) 20 MG tablet Take 1 tablet (20 mg total) by mouth daily. 06/08/20  Yes Dyann Kief, PA-C  metFORMIN (GLUCOPHAGE) 1000 MG tablet Take 1 tablet (1,000 mg total) by mouth 2 (two) times daily with a meal. 08/05/20 09/04/20 Yes Pemberton, Kathlynn Grate, MD  nicotine (NICODERM CQ - DOSED IN MG/24 HOURS) 21 mg/24hr patch Place 1 patch (21 mg total) onto the skin daily. 07/20/20  Yes Meriam Sprague, MD  nitroGLYCERIN (NITROSTAT) 0.4 MG SL tablet Place 1 tablet (0.4 mg total) under the tongue every 5 (five) minutes as needed for chest pain. 08/10/20 08/10/21 Yes Georgie Chard D, NP  nitroGLYCERIN (NITROSTAT) 0.4 MG SL tablet Place 1 tablet (0.4 mg total) under the tongue every 5 (five) minutes as needed for chest pain. 08/10/20  Yes Wynetta Fines, MD  perphenazine (TRILAFON) 16 MG tablet Take 1 pill total 16 mg every morning and take 2 pills by mouth total 32 mg every bedtime 03/29/20  Yes Chauncey Mann, MD  spironolactone (ALDACTONE) 25 MG tablet Take 0.5 tablets (12.5 mg total) by mouth daily. 07/20/20 07/15/21 Yes Meriam Sprague, MD  ticagrelor (BRILINTA) 90 MG TABS tablet Take 1 tablet (90 mg total) by mouth 2 (two) times daily. 06/07/20  Yes Meriam Sprague, MD  traZODone (DESYREL) 100 MG tablet TAKE 3 TABLETS BY MOUTH EVERY DAY AT BEDTIME AS NEEDED FOR SLEEP Patient taking differently: Take 300 mg by mouth at bedtime. TAKE 3 TABLETS BY MOUTH EVERY DAY AT BEDTIME 03/29/20  Yes Chauncey Mann, MD  atomoxetine (STRATTERA) 40 MG capsule Take 1 capsule (40 mg total) by mouth daily. Patient not taking: No sig reported 07/28/20   Melony Overly T, PA-C  clonazePAM (KLONOPIN) 1 MG tablet Take 1 tablet  (1 mg total) by mouth 3 (three) times daily as needed for anxiety. 07/28/20   Melony Overly T, PA-C  lithium carbonate (LITHOBID) 300 MG CR tablet TAKE 2 TABLETS (600 MG TOTAL) BY MOUTH AT BEDTIME. Patient not taking: Reported on 08/10/2020 03/29/20   Chauncey Mann, MD  nicotine polacrilex (NICORETTE) 2 MG gum Take 1 each (2 mg total) by mouth as needed for smoking cessation. Patient not taking: No sig reported 02/11/15   Armandina Stammer I, NP    Allergies    Gluten meal  Review of Systems   Review of Systems  Cardiovascular: Positive for chest pain.  All other systems reviewed and are negative.   Physical Exam Updated Vital Signs BP 130/85 (BP Location: Right Arm)   Pulse (!) 102   Temp 98.2 F (36.8  C) (Oral)   Resp 20   Ht 6\' 2"  (1.88 m)   Wt 115.7 kg   SpO2 94%   BMI 32.74 kg/m   Physical Exam Vitals and nursing note reviewed.  Constitutional:      General: He is not in acute distress.    Appearance: Normal appearance. He is well-developed.  HENT:     Head: Normocephalic and atraumatic.  Eyes:     Conjunctiva/sclera: Conjunctivae normal.     Pupils: Pupils are equal, round, and reactive to light.  Cardiovascular:     Rate and Rhythm: Normal rate and regular rhythm.     Heart sounds: Normal heart sounds.  Pulmonary:     Effort: Pulmonary effort is normal. No respiratory distress.     Breath sounds: Normal breath sounds.  Abdominal:     General: There is no distension.     Palpations: Abdomen is soft.     Tenderness: There is no abdominal tenderness.  Musculoskeletal:        General: No deformity. Normal range of motion.     Cervical back: Normal range of motion and neck supple.  Skin:    General: Skin is warm and dry.  Neurological:     General: No focal deficit present.     Mental Status: He is alert and oriented to person, place, and time. Mental status is at baseline.     ED Results / Procedures / Treatments   Labs (all labs ordered are listed, but  only abnormal results are displayed) Labs Reviewed  BASIC METABOLIC PANEL - Abnormal; Notable for the following components:      Result Value   Sodium 131 (*)    CO2 19 (*)    Glucose, Bld 199 (*)    All other components within normal limits  CBC - Abnormal; Notable for the following components:   WBC 14.6 (*)    RBC 6.03 (*)    All other components within normal limits  RESP PANEL BY RT-PCR (FLU A&B, COVID) ARPGX2  PROTIME-INR  TROPONIN I (HIGH SENSITIVITY)  TROPONIN I (HIGH SENSITIVITY)    EKG EKG Interpretation  Date/Time:  Wednesday August 10 2020 11:39:30 EDT Ventricular Rate:  98 PR Interval:  194 QRS Duration: 110 QT Interval:  336 QTC Calculation: 429 R Axis:   143 Text Interpretation: Sinus arrhythmia Prolonged PR interval Left posterior fascicular block Borderline low voltage, extremity leads Abnormal R-wave progression, late transition Confirmed by Kristine RoyalMessick, Amare Kontos [phone]([id number]) on 08/10/2020 11:52:54 AM Also confirmed by Kristine RoyalMessick, Robben Jagiello [phone]), editor Elita QuickWatlington, [place] [phone])  on 08/11/2020 1:21:35 PM   Radiology DG Chest Portable 1 View  Result Date: 08/10/2020 CLINICAL DATA:  45 year old male with history of 3 days of chest pain with radiation to the back. EXAM: PORTABLE CHEST 1 VIEW COMPARISON:  Chest x-ray 06/07/2020. FINDINGS: Lung volumes are normal. No consolidative airspace disease. No pleural effusions. No pneumothorax. No pulmonary nodule or mass noted. Pulmonary vasculature and the cardiomediastinal silhouette are within normal limits. IMPRESSION: No radiographic evidence of acute cardiopulmonary disease. Electronically Signed   By: Trudie Reedaniel  Entrikin M.D.   On: 08/10/2020 12:06    Procedures Procedures   Medications Ordered in ED Medications  morphine 4 MG/ML injection 4 mg (4 mg Intravenous Given 08/10/20 1153)  LORazepam (ATIVAN) injection 0.5 mg (0.5 mg Intravenous Given 08/10/20 1207)  ketorolac (TORADOL) 15 MG/ML injection 15 mg (15 mg Intravenous Given  08/10/20 1436)    ED Course  I have reviewed the triage vital signs  and the nursing notes.  Pertinent labs & imaging results that were available during my care of the patient were reviewed by me and considered in my medical decision making (see chart for details).    MDM Rules/Calculators/A&P                          MDM  Screen complete  BOMANI OOMMEN was evaluated in Emergency Department on 08/11/2020 for the symptoms described in the history of present illness. He was evaluated in the context of the global COVID-19 pandemic, which necessitated consideration that the patient might be at risk for infection with the SARS-CoV-2 virus that causes COVID-19. Institutional protocols and algorithms that pertain to the evaluation of patients at risk for COVID-19 are in a state of rapid change based on information released by regulatory bodies including the CDC and federal and state organizations. These policies and algorithms were followed during the patient's care in the ED.  Patient presents with atypical cp with recent history of intervention.  Cardiology made aware of case shortly after initial EDP evaluation.   Cardiology consulted after workup complete. Patient to be discharged with continued close outpatient follow up with cardiology.  Patient understands plan of care as discussed at time of discharge from the ED. Strict return precautions given and understood. Importance of close follow up and medication compliance stressed.   Final Clinical Impression(s) / ED Diagnoses Final diagnoses:  Atypical chest pain    Rx / DC Orders ED Discharge Orders         Ordered    nitroGLYCERIN (NITROSTAT) 0.4 MG SL tablet  Every 5 min PRN        08/10/20 1458    isosorbide mononitrate (IMDUR) 30 MG 24 hr tablet  Daily        08/10/20 1600    nitroGLYCERIN (NITROSTAT) 0.4 MG SL tablet  Every 5 min PRN        08/10/20 1600           Wynetta Fines, MD 08/11/20 1513

## 2020-08-11 NOTE — Telephone Encounter (Signed)
Cardiac Rehab Medication Review by a Pharmacist  Does the patient  feel that his/her medications are working for him/her?  Yes  Has the patient been experiencing any side effects to the medications prescribed?  no  Does the patient measure his/her own blood pressure or blood glucose at home?  no   Does the patient have any problems obtaining medications due to transportation or finances?   no  Understanding of regimen: fair Understanding of indications: fair Potential of compliance: fair    Pharmacist Intervention: Counseled patient on indications and to measure blood pressure daily   Lamar Sprinkles, PharmD PGY1 Pharmacy Resident 08/11/2020 4:39 PM

## 2020-08-12 ENCOUNTER — Ambulatory Visit (HOSPITAL_COMMUNITY): Payer: Medicaid Other | Attending: Cardiology

## 2020-08-12 ENCOUNTER — Telehealth: Payer: Self-pay | Admitting: Physician Assistant

## 2020-08-12 DIAGNOSIS — I255 Ischemic cardiomyopathy: Secondary | ICD-10-CM | POA: Insufficient documentation

## 2020-08-12 LAB — ECHOCARDIOGRAM COMPLETE
Area-P 1/2: 3.03 cm2
S' Lateral: 3.2 cm

## 2020-08-12 NOTE — Telephone Encounter (Signed)
NO INCREASE OR EARLY RF ON KLONOPIN.  Have him increase gabapentin to 1 po tid and add an extra 1/2 pill during day.  So 1 at breakfast, 1 at lunch, 1 at supper, and 1/2 at hs.  I'm sorry to hear about his Mom.

## 2020-08-12 NOTE — Telephone Encounter (Signed)
Pt called requesting apt today. Has apt  5/16. Having anxiety way out of control. Mom had stroke. He's taking care of her and has a lot of stress. Contact @ [phone]. [facility] [address] [facility]

## 2020-08-12 NOTE — Telephone Encounter (Signed)
Looks like pt called on 3/21 requesting increase in Klonopin. Not sure it was addressed/seen

## 2020-08-15 ENCOUNTER — Telehealth (HOSPITAL_COMMUNITY): Payer: Self-pay | Admitting: *Deleted

## 2020-08-15 ENCOUNTER — Telehealth: Payer: Self-pay | Admitting: Cardiology

## 2020-08-15 ENCOUNTER — Encounter: Payer: Self-pay | Admitting: Internal Medicine

## 2020-08-15 ENCOUNTER — Other Ambulatory Visit: Payer: Self-pay

## 2020-08-15 ENCOUNTER — Ambulatory Visit: Payer: Medicaid Other | Admitting: Internal Medicine

## 2020-08-15 VITALS — BP 140/92 | HR 128 | Ht 74.0 in | Wt 249.0 lb

## 2020-08-15 DIAGNOSIS — Z8639 Personal history of other endocrine, nutritional and metabolic disease: Secondary | ICD-10-CM

## 2020-08-15 DIAGNOSIS — E1165 Type 2 diabetes mellitus with hyperglycemia: Secondary | ICD-10-CM

## 2020-08-15 DIAGNOSIS — E782 Mixed hyperlipidemia: Secondary | ICD-10-CM

## 2020-08-15 LAB — COMPREHENSIVE METABOLIC PANEL
ALT: 37 U/L (ref 0–53)
AST: 23 U/L (ref 0–37)
Albumin: 4.9 g/dL (ref 3.5–5.2)
Alkaline Phosphatase: 108 U/L (ref 39–117)
BUN: 12 mg/dL (ref 6–23)
CO2: 22 mEq/L (ref 19–32)
Calcium: 9.9 mg/dL (ref 8.4–10.5)
Chloride: 97 mEq/L (ref 96–112)
Creatinine, Ser: 1.23 mg/dL (ref 0.40–1.50)
GFR: 72.39 mL/min (ref >60.00)
Glucose, Bld: 292 mg/dL — ABNORMAL HIGH (ref 70–99)
Potassium: 3.9 mEq/L (ref 3.5–5.1)
Sodium: 130 mEq/L — ABNORMAL LOW (ref 135–145)
Total Bilirubin: 0.4 mg/dL (ref 0.2–1.2)
Total Protein: 7.7 g/dL (ref 6.0–8.3)

## 2020-08-15 LAB — LIPID PANEL
Cholesterol: 133 mg/dL (ref 0–200)
HDL: 23.5 mg/dL — ABNORMAL LOW (ref >39.00)
NonHDL: 109.53
Total CHOL/HDL Ratio: 6
Triglycerides: 377 mg/dL — ABNORMAL HIGH (ref 0.0–149.0)
VLDL: 75.4 mg/dL — ABNORMAL HIGH (ref 0.0–40.0)

## 2020-08-15 LAB — POCT GLYCOSYLATED HEMOGLOBIN (HGB A1C): Hemoglobin A1C: 12.3 % — AB (ref 4.0–5.6)

## 2020-08-15 LAB — LDL CHOLESTEROL, DIRECT: Direct LDL: 68 mg/dL

## 2020-08-15 LAB — POCT GLUCOSE (DEVICE FOR HOME USE): POC Glucose: 266 mg/dl — AB (ref 70–99)

## 2020-08-15 MED ORDER — INSULIN PEN NEEDLE 32G X 4 MM MISC: 1.0000 | Freq: Every day | 3 refills | Status: DC

## 2020-08-15 MED ORDER — EMPAGLIFLOZIN 25 MG PO TABS: 25.0000 mg | ORAL_TABLET | Freq: Every day | ORAL | 1 refills | Status: DC

## 2020-08-15 MED ORDER — METFORMIN HCL 1000 MG PO TABS: 1000.0000 mg | ORAL_TABLET | Freq: Two times a day (BID) | ORAL | 3 refills | Status: DC

## 2020-08-15 MED ORDER — BASAGLAR KWIKPEN 100 UNIT/ML ~~LOC~~ SOPN: 16.0000 [IU] | PEN_INJECTOR | Freq: Every day | SUBCUTANEOUS | 6 refills | Status: DC

## 2020-08-15 NOTE — Telephone Encounter (Signed)
Thank you! Agree with plan to start imdur.

## 2020-08-15 NOTE — Telephone Encounter (Signed)
Called Matthew [name] to confirm appointment for cardiac rehab orientation. Patient confirmed appointment as said that he is having " a little chest discomfort not much. I asked Matthew Pounders to call Dr Devin Going office to notify. Will get clearance from Dr Shari Prows before proceeding with exercise as the patient recently went to the ED on 08/10/20 with chest pain. Gladstone Lighter, RN,BSN 08/15/2020 10:32 AM

## 2020-08-15 NOTE — Telephone Encounter (Signed)
Outreach made to Pt per Dr. Devin Going request.  Dr. Shari Prows asks to confirm Pt is taking his medications.  Spoke with Pt.  Asked Pt if he is taking his medications?  Pt states yes.  Asked Pt if he is taking isosorbide mononitrate that was prescribed at recent ER visit for chest pain?  Pt states NO.  Advised Pt to start medication.    Will send to Dr. Shari Prows for review.

## 2020-08-15 NOTE — Telephone Encounter (Signed)
Maria from Cardiac Rehab called the patient today to remind him of his upcoming appointment for Cardiac Rehab tomorrow. The patient mentioned to Byrd Hesselbach that he was having some mild chest discomfort. Byrd Hesselbach would like someone from the office to follow up with him regarding the chest discomfort.

## 2020-08-15 NOTE — Telephone Encounter (Signed)
Tried to reach pt several times but no answer and his mailbox is full. Will try back later

## 2020-08-15 NOTE — Telephone Encounter (Signed)
-----   Message from Meriam Sprague, MD sent at 08/15/2020 12:52 PM EDT ----- Regarding: RE: Starting cardiac rehab Cleatis Polka,   I heard he is having some mild chest pain. He just had negative troponins in the ER with no ischemic changes on ECG. Echo was normal with EF 60-65%. We will make sure he is taking his medications and his blood pressure is controlled but he is still okay to start rehab. He has had some non-specific chest pain intermittently, which did not sound cardiac in nature.   Thanks again, [employer] ----- Message ----- From: Cammy Copa, RN Sent: 08/15/2020   9:51 AM EDT To: Meriam Sprague, MD Subject: Starting cardiac rehab                         Good morning Dr Shari Prows,  Mr Miley is supposed to attend cardiac rehab orientation tomorrow and begin exercise next week. Is it okay for Mr Middleton to proceed? Mr Steele Berg went to the ED on 08/10/20 and was evaluated by Dr Katrinka Blazing.  Thanks for your input!  Gregary Signs has his next follow up appointment with you on 08/31/20  Sincerely,  Gladstone Lighter RN Cardiac Rehab

## 2020-08-15 NOTE — Telephone Encounter (Signed)
Per Dr. Laqueta Carina to proceed with cardiac rehab.

## 2020-08-15 NOTE — Progress Notes (Signed)
Name: LEYTON [name]  MRN/ DOB: [id number], 03-17-1976   Age/ Sex: 45 y.o., male    PCP: [name], MD   Reason for Endocrinology Evaluation: Type 2 Diabetes Mellitus     Date of Initial Endocrinology Visit: 08/15/2020     PATIENT IDENTIFIER: Matthew [name] is a 45 y.o. male with a past medical history of T2DN, CAD , OSA and schizoaffective d/o . The patient presented for initial endocrinology clinic visit on 08/15/2020 for consultative assistance with his diabetes management.    HPI: Mr. Granlund was    Diagnosed with DM 2021  Prior Medications tried/Intolerance: as listed  Currently checking blood sugars 0x / day Hypoglycemia episodes : no               Hemoglobin A1c has ranged from 6.4% in 2020, peaking at 12.8% in 2022. Patient required assistance for hypoglycemia: no Patient has required hospitalization within the last 1 year from hyper or hypoglycemia: no   In terms of diet, the patient eats 4 meals a day. Drinks sugar-sweetened beverages   Lives alone   Pt on disability for mental health   HOME DIABETES REGIMEN: Metformin 1000 mg BID Jardiance 10 mg daily    Statin: yes ACE-I/ARB: yes Prior Diabetic Education: no    METER DOWNLOAD SUMMARY: Does not check    DIABETIC COMPLICATIONS: Microvascular complications:   Denies: CKD, neuropathy, retinopathy  Last eye exam: Completed - does not recall   Macrovascular complications:  CAD Denies: PVD, CVA   PAST HISTORY: Past Medical History:  Past Medical History:  Diagnosis Date  . Anxiety   . Celiac disease   . Dairy allergy   . Depression   . High cholesterol   . Hypertension   . Paranoia (HCC)   . Pre-diabetes   . Schizoaffective disorder [facility])    Past Surgical History:  Past Surgical History:  Procedure Laterality Date  . APPENDECTOMY    . BRAIN SURGERY    . CORONARY STENT INTERVENTION N/A 06/06/2020   Procedure: CORONARY STENT INTERVENTION;  Surgeon: Swaziland, Peter M, MD;  Location: [facility]  INVASIVE CV LAB;  Service: Cardiovascular;  Laterality: N/A;  prox LAD, distal RCA  . INTRAVASCULAR ULTRASOUND/IVUS N/A 06/06/2020   Procedure: Intravascular Ultrasound/IVUS;  Surgeon: Swaziland, Peter M, MD;  Location: [facility] INVASIVE CV LAB;  Service: Cardiovascular;  Laterality: N/A;  . LEFT HEART CATH AND CORONARY ANGIOGRAPHY N/A 06/06/2020   Procedure: LEFT HEART CATH AND CORONARY ANGIOGRAPHY;  Surgeon: Swaziland, Peter M, MD;  Location: [facility] INVASIVE CV LAB;  Service: Cardiovascular;  Laterality: N/A;  . NASAL SEPTUM SURGERY    . WRIST FRACTURE SURGERY     right wrist    Social History:  reports that he has been smoking cigarettes. He has been smoking about 2.00 packs per day. He has never used smokeless tobacco. He reports that he does not drink alcohol and does not use drugs. Family History:  Family History  Problem Relation Age of Onset  . Asthma Mother   . Hypertension Mother   . Hypertension Father      HOME MEDICATIONS: Allergies as of 08/15/2020      Reactions   Gluten Meal Other (See Comments)   Celiac disease      Medication List       Accurate as of August 15, 2020  3:04 PM. If you have any questions, ask your nurse or doctor.        aspirin EC 81 MG tablet Take  1 tablet (81 mg total) by mouth daily. Swallow whole.   atomoxetine 40 MG capsule Commonly known as: Strattera Take 1 capsule (40 mg total) by mouth daily.   atorvastatin 40 MG tablet Commonly known as: LIPITOR Take 1 tablet (40 mg total) by mouth daily.   carvedilol 6.25 MG tablet Commonly known as: COREG Take 1 tablet (6.25 mg total) by mouth 2 (two) times daily.   clonazePAM 1 MG tablet Commonly known as: KLONOPIN Take 1 tablet (1 mg total) by mouth 3 (three) times daily as needed for anxiety.   DULoxetine 60 MG capsule Commonly known as: CYMBALTA TAKE 1 CAPSULE BY MOUTH 2 TIMES DAILY.   empagliflozin 10 MG Tabs tablet Commonly known as: JARDIANCE Take 1 tablet (10 mg total) by mouth daily before  breakfast.   gabapentin 800 MG tablet Commonly known as: NEURONTIN TAKE 1 TABLET BY MOUTH THREE TIMES A DAY   isosorbide mononitrate 30 MG 24 hr tablet Commonly known as: IMDUR Take 0.5 tablets (15 mg total) by mouth daily.   lisinopril 20 MG tablet Commonly known as: ZESTRIL Take 1 tablet (20 mg total) by mouth daily.   lithium carbonate 300 MG CR tablet Commonly known as: LITHOBID TAKE 2 TABLETS (600 MG TOTAL) BY MOUTH AT BEDTIME.   metFORMIN 1000 MG tablet Commonly known as: GLUCOPHAGE Take 1 tablet (1,000 mg total) by mouth 2 (two) times daily with a meal.   nicotine 21 mg/24hr patch Commonly known as: NICODERM CQ - dosed in mg/24 hours Place 1 patch (21 mg total) onto the skin daily.   nicotine polacrilex 2 MG gum Commonly known as: NICORETTE Take 1 each (2 mg total) by mouth as needed for smoking cessation.   nitroGLYCERIN 0.4 MG SL tablet Commonly known as: Nitrostat Place 1 tablet (0.4 mg total) under the tongue every 5 (five) minutes as needed for chest pain.   perphenazine 16 MG tablet Commonly known as: TRILAFON Take 1 pill total 16 mg every morning and take 2 pills by mouth total 32 mg every bedtime   spironolactone 25 MG tablet Commonly known as: ALDACTONE Take 0.5 tablets (12.5 mg total) by mouth daily.   ticagrelor 90 MG Tabs tablet Commonly known as: BRILINTA Take 1 tablet (90 mg total) by mouth 2 (two) times daily.   traZODone 100 MG tablet Commonly known as: DESYREL TAKE 3 TABLETS BY MOUTH EVERY DAY AT BEDTIME AS NEEDED FOR SLEEP What changed:  how much to take how to take this when to take this additional instructions        ALLERGIES: Allergies  Allergen Reactions  . Gluten Meal Other (See Comments)    Celiac disease     REVIEW OF SYSTEMS: A comprehensive ROS was conducted with the patient and is negative except as per HPI and below:  ROS    OBJECTIVE:   VITAL SIGNS: BP (!) 140/92   Pulse (!) 128   Ht  (1.88 m)   Wt  249 lb (112.9 kg)   SpO2 94%   BMI 31.97 kg/m    PHYSICAL EXAM:  General: Pt appears well and is in NAD  Neck: General: Supple without adenopathy or carotid bruits. Thyroid: Thyroid size normal.  No goiter or nodules appreciated  Lungs: Clear with good BS bilat   Heart: RRR  Abdomen: Normoactive bowel sounds, soft, nontender, without masses or organomegaly palpable  Extremities:  Lower extremities - No pretibial edema. No lesions.  Skin: No rash   Neuro: MS is  good with appropriate affect, pt is alert and Ox3    DM foot exam: 08/15/2020  The skin of the feet is without sores or ulcerations, callous formation at the heels The pedal pulses are undetectable  The sensation is intact to a screening 5.07, 10 gram monofilament bilaterally   DATA REVIEWED:  Lab Results  Component Value Date   HGBA1C 12.3 (A) 08/15/2020   HGBA1C 12.8 (H) 06/06/2020   HGBA1C 6.4 (H) 10/21/2018   Results for CURTIS, CAIN (MRN [id number]) as of 08/16/2020 16:14  Ref. Range 08/15/2020 15:26  Sodium Latest Ref Range: 135 - 145 mEq/L 130 (L)  Potassium Latest Ref Range: 3.5 - 5.1 mEq/L 3.9  Chloride Latest Ref Range: 96 - 112 mEq/L 97  CO2 Latest Ref Range: 19 - 32 mEq/L 22  Glucose Latest Ref Range: 70 - 99 mg/dL 681 (H)  BUN Latest Ref Range: 6 - 23 mg/dL 12  Creatinine Latest Ref Range: 0.40 - 1.50 mg/dL 2.75  Calcium Latest Ref Range: 8.4 - 10.5 mg/dL 9.9  Alkaline Phosphatase Latest Ref Range: 39 - 117 U/L 108  Albumin Latest Ref Range: 3.5 - 5.2 g/dL 4.9  AST Latest Ref Range: 0 - 37 U/L 23  ALT Latest Ref Range: 0 - 53 U/L 37  Total Protein Latest Ref Range: 6.0 - 8.3 g/dL 7.7  Total Bilirubin Latest Ref Range: 0.2 - 1.2 mg/dL 0.4  GFR Latest Ref Range: >60.00 mL/min 72.39  Total CHOL/HDL Ratio Unknown 6  Cholesterol Latest Ref Range: 0 - 200 mg/dL 170  HDL Cholesterol Latest Ref Range: >39.00 mg/dL 01.74 (L)  Direct LDL Latest Units: mg/dL 94.4  NonHDL Unknown 967.59  Triglycerides  Latest Ref Range: 0.0 - 149.0 mg/dL 163.8 (H)  VLDL Latest Ref Range: 0.0 - 40.0 mg/dL 46.6 (H)  Prolactin Latest Ref Range: 2.0 - 18.0 ng/mL 5.4         ASSESSMENT / PLAN / RECOMMENDATIONS:   1) Type 2 Diabetes Mellitus, Poorly controlled, With neuropathic and macrovascular  complications - Most recent A1c of 12.0 %. Goal A1c < 7.0%.    Plan: GENERAL: I have discussed with the patient the pathophysiology of diabetes. We went over the natural progression of the disease. We talked about both insulin resistance and insulin deficiency. We stressed the importance of lifestyle changes including diet and exercise. I explained the complications associated with diabetes including retinopathy, nephropathy, neuropathy as well as increased risk of cardiovascular disease. We went over the benefit seen with glycemic control.   I explained to the patient that diabetic patients are at higher than normal risk for amputations.  Poorly controlled diabetes due to dietary indiscretions, the pt lives alone, does not cook and eats fast food. Will refer to our RD , will try our best  He is in agreement in starting insulin, he was educated by my CMA on proper injection technique   MEDICATIONS: - Continue Metformin 1000 mg , 1 tablet before Breakfast and 1 tablet before Supper  - Increase Jardiance 25 mg, 1 tablet before Breakfast  - Start Basaglar 16 units daily   EDUCATION / INSTRUCTIONS: BG monitoring instructions: Patient is instructed to check his blood sugars 1 times a day, fasting . Call [facility] if: BG persistently < 70  I reviewed the Rule of 15 for the treatment of hypoglycemia in detail with the patient. Literature supplied.   2) Diabetic complications:  Eye: Does not have known diabetic retinopathy.  Neuro/ Feet: Does not have  known diabetic peripheral neuropathy. Renal: Patient does not have known baseline CKD. He is on an ACEI/ARB at present.      3)  Hypertriglyceridemia   - Tg level as high as 2311 mg/dL in 08/9447 - He was advised to go on low fat diet but I am not sure how feasible that is for him, he doesn't;t cook, eats fast foods through out the day, we discussed ways of having a low carb meal by replacing the fries with a side salad and avoiding sugar sweetened beverages  - Tg is trending down   He is on atorvastatin 40 mg daily    4) Elevated prolactin :  - In review of his records, he was noted with elevated prolactin in 2016 at 52.5 ng/mL  - denies galactorrhea  - Repeat today is normal   F/U in 3 months     Signed electronically by: Lyndle Herrlich, MD  [facility]  [facility] [address] [place] [place], [place] [address] Phone: [phone] FAX: [phone]   CC: [name], MD [address] [place] [address] Phone: [phone]  Fax: [phone]    Return to Endocrinology clinic as below: Future Appointments  Date Time Provider Department Center  08/16/2020  9:30 AM [facility] PHASE II ORIENT [facility] None  08/22/2020  1:30 PM [facility] PHASE II EXC [facility] None  08/24/2020  1:30 PM [facility] PHASE II EXC [facility] None  08/26/2020  1:30 PM [facility] PHASE II EXC [facility] None  08/29/2020  1:30 PM [facility] PHASE II EXC [facility] None  08/31/2020  8:20 AM Meriam Sprague, MD [facility]  08/31/2020  1:30 PM [facility] PHASE II EXC [facility] None  09/02/2020  1:30 PM [facility] PHASE II EXC [facility] None  09/05/2020  1:30 PM [facility] PHASE II EXC [facility] None  09/07/2020  1:30 PM [facility] PHASE II EXC [facility] None  09/09/2020  1:30 PM [facility] PHASE II EXC [facility] None  09/12/2020  1:30 PM [facility] PHASE II EXC [facility] None  09/14/2020  1:30 PM [facility] PHASE II EXC [facility] None  09/16/2020  1:30 PM [facility] PHASE II EXC [facility] None  09/19/2020  1:30 PM [facility] PHASE II EXC [facility] None  09/21/2020  1:30 PM [facility] PHASE II EXC [facility] None   09/23/2020  1:30 PM [facility] PHASE II EXC [facility] None  09/26/2020  1:30 PM [facility] PHASE II EXC [facility] None  09/28/2020  1:30 PM [facility] PHASE II EXC [facility] None  09/30/2020  1:30 PM [facility] PHASE II EXC [facility] None  10/03/2020  1:30 PM [facility] PHASE II EXC [facility] None  10/03/2020  2:30 PM Hurst, Teresa T, PA-C [facility] None  10/05/2020  1:30 PM [facility] PHASE II EXC [facility] None  10/07/2020  1:30 PM [facility] PHASE II EXC [facility] None  10/10/2020  1:30 PM [facility] PHASE II EXC [facility] None  10/12/2020  1:30 PM [facility] PHASE II EXC [facility] None  10/14/2020  1:30 PM [facility] PHASE II EXC [facility] None

## 2020-08-15 NOTE — Patient Instructions (Signed)
-   Continue Metformin 1000 mg , 1 tablet before Breakfast and 1 tablet before Supper  - Increase Jardiance 25 mg, 1 tablet before Breakfast  - Start Basaglar 16 units daily       HOW TO TREAT LOW BLOOD SUGARS (Blood sugar LESS THAN 70 MG/DL)  Please follow the RULE OF 15 for the treatment of hypoglycemia treatment (when your (blood sugars are less than 70 mg/dL)    STEP 1: Take 15 grams of carbohydrates when your blood sugar is low, which includes:   3-4 GLUCOSE TABS  OR  3-4 OZ OF JUICE OR REGULAR SODA OR  ONE TUBE OF GLUCOSE GEL     STEP 2: RECHECK blood sugar in 15 MINUTES STEP 3: If your blood sugar is still low at the 15 minute recheck --> then, go back to STEP 1 and treat AGAIN with another 15 grams of carbohydrates.

## 2020-08-16 ENCOUNTER — Telehealth: Payer: Self-pay | Admitting: Physician Assistant

## 2020-08-16 ENCOUNTER — Encounter (HOSPITAL_COMMUNITY): Payer: Self-pay

## 2020-08-16 ENCOUNTER — Encounter (HOSPITAL_COMMUNITY)
Admission: RE | Admit: 2020-08-16 | Discharge: 2020-08-16 | Disposition: A | Discharge status: Home / Self Care | Payer: Medicaid Other | Source: Ambulatory Visit | Attending: Cardiology | Admitting: Cardiology

## 2020-08-16 VITALS — BP 98/72 | HR 121 | Ht 72.75 in | Wt 248.5 lb

## 2020-08-16 DIAGNOSIS — Z8639 Personal history of other endocrine, nutritional and metabolic disease: Secondary | ICD-10-CM | POA: Insufficient documentation

## 2020-08-16 DIAGNOSIS — Z955 Presence of coronary angioplasty implant and graft: Secondary | ICD-10-CM | POA: Diagnosis present

## 2020-08-16 DIAGNOSIS — I214 Non-ST elevation (NSTEMI) myocardial infarction: Secondary | ICD-10-CM | POA: Insufficient documentation

## 2020-08-16 HISTORY — DX: Atherosclerotic heart disease of native coronary artery without angina pectoris: I25.10

## 2020-08-16 HISTORY — DX: Type 2 diabetes mellitus without complications: E11.9

## 2020-08-16 LAB — PROLACTIN: Prolactin: 5.4 ng/mL (ref 2.0–18.0)

## 2020-08-16 LAB — GLUCOSE, CAPILLARY: Glucose-Capillary: 268 mg/dL — ABNORMAL HIGH (ref 70–99)

## 2020-08-16 NOTE — Progress Notes (Signed)
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  aspirin EC 81 MG tablet, Take 1 tablet (81 mg total) by mouth daily. Swallow whole., Disp: 90 tablet, Rfl: 3 .  atomoxetine (STRATTERA) 40 MG capsule, Take 1 capsule (40 mg total) by mouth daily. (Patient not taking: No sig reported), Disp: 30 capsule, Rfl: 1 .  atorvastatin (LIPITOR) 40 MG tablet, Take 1 tablet (40 mg total) by mouth daily., Disp: 90 tablet, Rfl: 3 .  carvedilol (COREG) 6.25 MG tablet, Take 1 tablet (6.25 mg total) by mouth 2 (two) times daily., Disp: 180 tablet, Rfl: 3 .  clonazePAM (KLONOPIN) 1 MG tablet, Take 1 tablet (1 mg total) by mouth 3 (three) times daily as needed for anxiety., Disp: 90 tablet, Rfl: 1 .  DULoxetine (CYMBALTA) 60 MG capsule, TAKE 1 CAPSULE BY MOUTH 2 TIMES DAILY., Disp: 180 capsule, Rfl: 1 .  empagliflozin (JARDIANCE) 25 MG TABS tablet, Take 1 tablet (25 mg total) by mouth daily before breakfast., Disp: 90 tablet, Rfl: 1 .  gabapentin (NEURONTIN) 800 MG tablet, TAKE 1 TABLET BY MOUTH THREE TIMES A DAY, Disp: 270 tablet, Rfl: 1 .  Insulin Glargine (BASAGLAR KWIKPEN) 100 UNIT/ML, Inject 16 Units into the skin daily., Disp: 15 mL, Rfl: 6 .  Insulin Pen Needle 32G X 4 MM MISC, 1 Device by Does not apply route daily., Disp: 150 each, Rfl: 3 .  isosorbide mononitrate (IMDUR) 30 MG 24 hr tablet, Take 0.5 tablets (15 mg total) by mouth daily., Disp: 30 tablet, Rfl: 0 .   lisinopril (ZESTRIL) 20 MG tablet, Take 1 tablet (20 mg total) by mouth daily., Disp: 90 tablet, Rfl: 3 .  lithium carbonate (LITHOBID) 300 MG CR tablet, TAKE 2 TABLETS (600 MG TOTAL) BY MOUTH AT BEDTIME., Disp: 180 tablet, Rfl: 1 .  metFORMIN (GLUCOPHAGE) 1000 MG tablet, Take 1 tablet (1,000 mg total) by mouth 2 (two) times daily with a meal., Disp: 180 tablet, Rfl: 3 .  nicotine (NICODERM CQ - DOSED IN MG/24 HOURS) 21 mg/24hr patch, Place 1 patch (21 mg total) onto the skin daily. (Patient not taking: Reported on 08/16/2020), Disp: 28 patch, Rfl: 3 .  nicotine polacrilex (NICORETTE) 2 MG gum, Take 1 each (2 mg total) by mouth as needed for smoking cessation., Disp: 100 tablet, Rfl: 0 .  nitroGLYCERIN (NITROSTAT) 0.4 MG SL tablet, Place 1 tablet (0.4 mg total) under the tongue every 5 (five) minutes as needed for chest pain., Disp: 100 tablet, Rfl: 3 .  perphenazine (TRILAFON) 16 MG tablet, Take 1 pill total 16 mg every morning and take 2 pills by mouth total 32 mg every bedtime, Disp: 270 tablet, Rfl: 1 .  spironolactone (ALDACTONE) 25 MG tablet, Take 0.5 tablets (12.5 mg total) by mouth daily., Disp: 45 tablet, Rfl: 3 .  ticagrelor (BRILINTA) 90 MG TABS tablet, Take 1 tablet (90 mg total) by mouth 2 (two) times daily., Disp: 60 tablet, Rfl: 11 .  traZODone (DESYREL) 100 MG  tablet, TAKE 3 TABLETS BY MOUTH EVERY DAY AT BEDTIME AS NEEDED FOR SLEEP (Patient taking differently: Take 300 mg by mouth at bedtime. TAKE 3 TABLETS BY MOUTH EVERY DAY AT BEDTIME), Disp: 270 tablet, Rfl: 1  Past Medical History: Past Medical History:  Diagnosis Date  . Anxiety   . Celiac disease   . Coronary artery disease   . Dairy allergy   . Depression   . Diabetes mellitus without complication (HCC)   . High cholesterol   . Hypertension   . Paranoia (HCC)   . Pre-diabetes   . Schizoaffective disorder (HCC)     Tobacco Use: Social History   Tobacco Use  Smoking Status Heavy Tobacco Smoker  . Packs/day: 2.00   . Types: Cigarettes  Smokeless Tobacco Never Used  Tobacco Comment   patient given phone number to 1-800-quit now on 08/16/20    Labs: Recent Review Flowsheet Data    Labs for ITP Cardiac and Pulmonary Rehab Latest Ref Rng & Units 02/10/2015 10/21/2018 06/06/2020 08/15/2020   Cholestrol 0 - 200 mg/dL 161(W) 960(A) 540(J) 811   LDLCALC 0 - 99 mg/dL 914(N) UNABLE TO CALCULATE IF TRIGLYCERIDE OVER 400 mg/dL UNABLE TO CALCULATE IF TRIGLYCERIDE OVER 400 mg/dL -   LDLDIRECT mg/dL - 829.3(H) 51.0 68.0   HDL >39.00 mg/dL 46 56(O) NOT REPORTED DUE TO HIGH TRIGLYCERIDES 23.50(L)   Trlycerides 0.0 - 149.0 mg/dL 130(Q) 657(Q) 4,696(E) 377.0(H)   Hemoglobin A1c 4.0 - 5.6 % 6.4(H) 6.4(H) 12.8(H) 12.3(A)   PHART 7.350 - 7.450 - - 7.356 -   PCO2ART 32.0 - 48.0 mmHg - - 40.4 -   HCO3 20.0 - 28.0 mmol/L - - 22.6 -   TCO2 22 - 32 mmol/L - - 24 -   ACIDBASEDEF 0.0 - 2.0 mmol/L - - 3.0(H) -   O2SAT % - - 96.0 -      Capillary Blood Glucose: Lab Results  Component Value Date   GLUCAP 268 (H) 08/16/2020   GLUCAP 228 (H) 06/06/2020     Exercise Target Goals: Exercise Program Goal: Individual exercise prescription set using results from initial 6 min walk test and THRR while considering  patient's activity barriers and safety.   Exercise Prescription Goal: Starting with aerobic activity 30 plus minutes a day, 3 days per week for initial exercise prescription. Provide home exercise prescription and guidelines that participant acknowledges understanding prior to discharge.  Activity Barriers & Risk Stratification:  Activity Barriers & Cardiac Risk Stratification - 08/16/20 1222      Activity Barriers & Cardiac Risk Stratification   Activity Barriers Back Problems;Joint Problems;Chest Pain/Angina;Other (comment)    Comments Schizoaffective disorder    Cardiac Risk Stratification High           6 Minute Walk:  6 Minute Walk    Row Name 08/16/20 1024         6 Minute Walk   Phase Initial      Distance 1437 feet     Walk Time 0 minutes     # of Rest Breaks 0     MPH 2.72     METS 4.54     RPE 9     Perceived Dyspnea  0     VO2 Peak 15.9     Symptoms Yes (comment)     Comments Low back pain 2-3/10, chronic     Resting HR 122 bpm     Resting BP 98/72     Resting Oxygen Saturation  99 %  Exercise Oxygen Saturation  during 6 min walk 96 %     Max Ex. HR 134 bpm     Max Ex. BP 114/79     2 Minute Post BP 108/70            Oxygen Initial Assessment:   Oxygen Re-Evaluation:   Oxygen Discharge (Final Oxygen Re-Evaluation):   Initial Exercise Prescription:  Initial Exercise Prescription - 08/16/20 1200      Date of Initial Exercise RX and Referring Provider   Date 08/16/20    Referring Provider Laurance Flatten, MD    Expected Discharge Date 10/14/20      T5 Nustep   Level 3    SPM 75    Minutes 30    METs 2.2      Prescription Details   Frequency (times per week) 3    Duration Progress to 30 minutes of continuous aerobic without signs/symptoms of physical distress      Intensity   THRR 40-80% of Max Heartrate 71-142    Ratings of Perceived Exertion 11-13    Perceived Dyspnea 0-4      Progression   Progression Continue progressive overload as per policy without signs/symptoms or physical distress.      Resistance Training   Training Prescription Yes    Weight 5 lbs    Reps 10-15           Perform Capillary Blood Glucose checks as needed.  Exercise Prescription Changes:   Exercise Comments:   Exercise Goals and Review:  Exercise Goals    Row Name 08/16/20 1226             Exercise Goals   Increase Physical Activity Yes       Intervention Provide advice, education, support and counseling about physical activity/exercise needs.;Develop an individualized exercise prescription for aerobic and resistive training based on initial evaluation findings, risk stratification, comorbidities and participant's personal goals.       Expected  Outcomes Short Term: Attend rehab on a regular basis to increase amount of physical activity.;Long Term: Add in home exercise to make exercise part of routine and to increase amount of physical activity.;Long Term: Exercising regularly at least 3-5 days a week.       Increase Strength and Stamina Yes       Intervention Provide advice, education, support and counseling about physical activity/exercise needs.;Develop an individualized exercise prescription for aerobic and resistive training based on initial evaluation findings, risk stratification, comorbidities and participant's personal goals.       Expected Outcomes Short Term: Increase workloads from initial exercise prescription for resistance, speed, and METs.;Short Term: Perform resistance training exercises routinely during rehab and add in resistance training at home;Long Term: Improve cardiorespiratory fitness, muscular endurance and strength as measured by increased METs and functional capacity ( )       Able to understand and use rate of perceived exertion (RPE) scale Yes       Intervention Provide education and explanation on how to use RPE scale       Expected Outcomes Short Term: Able to use RPE daily in rehab to express subjective intensity level;Long Term:  Able to use RPE to guide intensity level when exercising independently       Knowledge and understanding of Target Heart Rate Range (THRR) Yes       Intervention Provide education and explanation of THRR including how the numbers were predicted and where they are located for reference  Expected Outcomes Short Term: Able to state/look up THRR;Short Term: Able to use daily as guideline for intensity in rehab;Long Term: Able to use THRR to govern intensity when exercising independently       Understanding of Exercise Prescription Yes       Intervention Provide education, explanation, and written materials on patient's individual exercise prescription       Expected Outcomes Short  Term: Able to explain program exercise prescription;Long Term: Able to explain home exercise prescription to exercise independently              Exercise Goals Re-Evaluation :    Discharge Exercise Prescription (Final Exercise Prescription Changes):   Nutrition:  Target Goals: Understanding of nutrition guidelines, daily intake of sodium 1500mg , cholesterol 200mg , calories 30% from fat and 7% or less from saturated fats, daily to have 5 or more servings of fruits and vegetables.  Biometrics:  Pre Biometrics - 08/16/20 0930      Pre Biometrics   Waist Circumference 48.5 inches    Hip Circumference 43.5 inches    Waist to Hip Ratio 1.11 %    Triceps Skinfold 24 mm    % Body Fat 34 %    Grip Strength 40 kg    Flexibility 11.25 in    Single Leg Stand 30 seconds            Nutrition Therapy Plan and Nutrition Goals:   Nutrition Assessments:  MEDIFICTS Score Key:  ?70 Need to make dietary changes   40-70 Heart Healthy Diet  ? 40 Therapeutic Level Cholesterol Diet   Picture Your Plate Scores:  <54<40 Unhealthy dietary pattern with much room for improvement.  41-50 Dietary pattern unlikely to meet recommendations for good health and room for improvement.  51-60 More healthful dietary pattern, with some room for improvement.   >60 Healthy dietary pattern, although there may be some specific behaviors that could be improved.    Nutrition Goals Re-Evaluation:   Nutrition Goals Discharge (Final Nutrition Goals Re-Evaluation):   Psychosocial: Target Goals: Acknowledge presence or absence of significant depression and/or stress, maximize coping skills, provide positive support system. Participant is able to verbalize types and ability to use techniques and skills needed for reducing stress and depression.  Initial Review & Psychosocial Screening:  Initial Psych Review & Screening - 08/16/20 1148      Initial Review   Current issues with History of  Depression;Current Stress Concerns;Current Psychotropic Meds;Current Anxiety/Panic    Source of Stress Concerns Chronic Illness;Unable to participate in former interests or hobbies;Family    Comments Nich has Schizoaffective disorder, ADHD and is worriend about his mother who recently had a stroke. Nich says he recently as been under a lot of stress      Family Dynamics   Good Support System? Yes   Weston Brassick lives alone. Weston Brassick says he has his mother, father, brother and sister for support     Barriers   Psychosocial barriers to participate in program The patient should benefit from training in stress management and relaxation.      Screening Interventions   Interventions Encouraged to exercise;Provide feedback about the scores to participant;To provide support and resources with identified psychosocial needs    Expected Outcomes Short Term goal: Utilizing psychosocial counselor, staff and physician to assist with identification of specific Stressors or current issues interfering with healing process. Setting desired goal for each stressor or current issue identified.;Long Term Goal: Stressors or current issues are controlled or eliminated.;Short Term goal: Identification  and review with participant of any Quality of Life or Depression concerns found by scoring the questionnaire.;Long Term goal: The participant improves quality of Life and PHQ9 Scores as seen by post scores and/or verbalization of changes           Quality of Life Scores:  Quality of Life - 08/16/20 1129      Quality of Life   Select Quality of Life      Quality of Life Scores   Health/Function Pre 10.5 %    Socioeconomic Pre 13.69 %    Psych/Spiritual Pre 15.14 %    Family Pre 12.9 %    GLOBAL Pre 12.5 %          Scores of 19 and below usually indicate a poorer quality of life in these areas.  A difference of  2-3 points is a clinically meaningful difference.  A difference of 2-3 points in the total score of the Quality  of Life Index has been associated with significant improvement in overall quality of life, self-image, physical symptoms, and general health in studies assessing change in quality of life.  PHQ-9: Recent Review Flowsheet Data    Depression screen [facility] 2/9 08/16/2020   Decreased Interest 0   Down, Depressed, Hopeless 0   PHQ - 2 Score 0     Interpretation of Total Score  Total Score Depression Severity:  1-4 = Minimal depression, 5-9 = Mild depression, 10-14 = Moderate depression, 15-19 = Moderately severe depression, 20-27 = Severe depression   Psychosocial Evaluation and Intervention:   Psychosocial Re-Evaluation:   Psychosocial Discharge (Final Psychosocial Re-Evaluation):   Vocational Rehabilitation: Provide vocational rehab assistance to qualifying candidates.   Vocational Rehab Evaluation & Intervention:  Vocational Rehab - 08/16/20 1338      Initial Vocational Rehab Evaluation & Intervention   Assessment shows need for Vocational Rehabilitation No   [name] is currently disabled and does not need vocational rehab          Education: Education Goals: Education classes will be provided on a weekly basis, covering required topics. Participant will state understanding/return demonstration of topics presented.  Learning Barriers/Preferences:  Learning Barriers/Preferences - 08/16/20 1130      Learning Barriers/Preferences   Learning Barriers None    Learning Preferences Group Instruction;Individual Instruction;Skilled Demonstration           Education Topics: Hypertension, Hypertension Reduction -Define heart disease and high blood pressure. Discus how high blood pressure affects the body and ways to reduce high blood pressure.   Exercise and Your Heart -Discuss why it is important to exercise, the FITT principles of exercise, normal and abnormal responses to exercise, and how to exercise safely.   Angina -Discuss definition of angina, causes of angina,  treatment of angina, and how to decrease risk of having angina.   Cardiac Medications -Review what the following cardiac medications are used for, how they affect the body, and side effects that may occur when taking the medications.  Medications include Aspirin, Beta blockers, calcium channel blockers, ACE Inhibitors, angiotensin receptor blockers, diuretics, digoxin, and antihyperlipidemics.   Congestive Heart Failure -Discuss the definition of CHF, how to live with CHF, the signs and symptoms of CHF, and how keep track of weight and sodium intake.   Heart Disease and Intimacy -Discus the effect sexual activity has on the heart, how changes occur during intimacy as we age, and safety during sexual activity.   Smoking Cessation / COPD -Discuss different methods to quit smoking, the  health benefits of quitting smoking, and the definition of COPD.   Nutrition I: Fats -Discuss the types of cholesterol, what cholesterol does to the heart, and how cholesterol levels can be controlled.   Nutrition II: Labels -Discuss the different components of food labels and how to read food label   Heart Parts/Heart Disease and PAD -Discuss the anatomy of the heart, the pathway of blood circulation through the heart, and these are affected by heart disease.   Stress I: Signs and Symptoms -Discuss the causes of stress, how stress may lead to anxiety and depression, and ways to limit stress.   Stress II: Relaxation -Discuss different types of relaxation techniques to limit stress.   Warning Signs of Stroke / TIA -Discuss definition of a stroke, what the signs and symptoms are of a stroke, and how to identify when someone is having stroke.   Knowledge Questionnaire Score:  Knowledge Questionnaire Score - 08/16/20 1217      Knowledge Questionnaire Score   Pre Score 25/28           Core Components/Risk Factors/Patient Goals at Admission:  Personal Goals and Risk Factors at Admission -  08/16/20 1218      Core Components/Risk Factors/Patient Goals on Admission    Weight Management Yes;Obesity;Weight Loss    Intervention Weight Management: Develop a combined nutrition and exercise program designed to reach desired caloric intake, while maintaining appropriate intake of nutrient and fiber, sodium and fats, and appropriate energy expenditure required for the weight goal.;Weight Management: Provide education and appropriate resources to help participant work on and attain dietary goals.;Weight Management/Obesity: Establish reasonable short term and long term weight goals.;Obesity: Provide education and appropriate resources to help participant work on and attain dietary goals.    Admit Weight 248 lb 7.3 oz (112.7 kg)    Expected Outcomes Understanding of distribution of calorie intake throughout the day with the consumption of 4-5 meals/snacks;Understanding recommendations for meals to include 15-35% energy as protein, 25-35% energy from fat, 35-60% energy from carbohydrates, less than 200mg  of dietary cholesterol, 20-35 gm of total fiber daily;Weight Loss: Understanding of general recommendations for a balanced deficit meal plan, which promotes 1-2 lb weight loss per week and includes a negative energy balance of [phone] kcal/d;Weight Maintenance: Understanding of the daily nutrition guidelines, which includes 25-35% calories from fat, 7% or less cal from saturated fats, less than 200mg  cholesterol, less than 1.5gm of sodium, & 5 or more servings of fruits and vegetables daily;Long Term: Adherence to nutrition and physical activity/exercise program aimed toward attainment of established weight goal;Short Term: Continue to assess and modify interventions until short term weight is achieved    Tobacco Cessation Yes    Number of packs per day 2    Intervention Assist the participant in steps to quit. Provide individualized education and counseling about committing to Tobacco Cessation, relapse  prevention, and pharmacological support that can be provided by physician.; , assist with locating and accessing local/national Quit Smoking programs, and support quit date choice.    Expected Outcomes Short Term: Will demonstrate readiness to quit, by selecting a quit date.;Short Term: Will quit all tobacco product use, adhering to prevention of relapse plan.;Long Term: Complete abstinence from all tobacco products for at least 12 months from quit date.    Diabetes Yes    Intervention Provide education about signs/symptoms and action to take for hypo/hyperglycemia.;Provide education about proper nutrition, including hydration, and aerobic/resistive exercise prescription along with prescribed medications to achieve blood glucose in  normal ranges: Fasting glucose 65-99 mg/dL    Expected Outcomes Short Term: Participant verbalizes understanding of the signs/symptoms and immediate care of hyper/hypoglycemia, proper foot care and importance of medication, aerobic/resistive exercise and nutrition plan for blood glucose control.;Long Term: Attainment of HbA1C < 7%.    Hypertension Yes    Intervention Provide education on lifestyle modifcations including regular physical activity/exercise, weight management, moderate sodium restriction and increased consumption of fresh fruit, vegetables, and low fat dairy, alcohol moderation, and smoking cessation.;Monitor prescription use compliance.    Expected Outcomes Short Term: Continued assessment and intervention until BP is < 140/44mm HG in hypertensive participants. < 130/53mm HG in hypertensive participants with diabetes, heart failure or chronic kidney disease.;Long Term: Maintenance of blood pressure at goal levels.    Lipids Yes    Intervention Provide education and support for participant on nutrition & aerobic/resistive exercise along with prescribed medications to achieve LDL 70mg , HDL >40mg .    Expected Outcomes Short Term:  Participant states understanding of desired cholesterol values and is compliant with medications prescribed. Participant is following exercise prescription and nutrition guidelines.;Long Term: Cholesterol controlled with medications as prescribed, with individualized exercise RX and with personalized nutrition plan. Value goals: LDL < 70mg , HDL > 40 mg.    Stress Yes    Intervention Offer individual and/or small group education and counseling on adjustment to heart disease, stress management and health-related lifestyle change. Teach and support self-help strategies.;Refer participants experiencing significant psychosocial distress to appropriate mental health specialists for further evaluation and treatment. When possible, include family members and significant others in education/counseling sessions.    Expected Outcomes Short Term: Participant demonstrates changes in health-related behavior, relaxation and other stress management skills, ability to obtain effective social support, and compliance with psychotropic medications if prescribed.;Long Term: Emotional wellbeing is indicated by absence of clinically significant psychosocial distress or social isolation.           Core Components/Risk Factors/Patient Goals Review:    Core Components/Risk Factors/Patient Goals at Discharge (Final Review):    ITP Comments:  ITP Comments    Row Name 08/16/20 1147           ITP Comments Dr 08/18/20 MD, Medical Director              Comments:Nick attended orientation on 08/16/2020 to review rules and guidelines for program.  Completed 6 minute walk test, Intitial ITP, and exercise prescription.  VSS. Telemetry-Sinus Tach.  Asymptomatic. Safety measures and social distancing in place per CDC guidelines. Please see previous documentation regarding heart rate.08/18/2020, RN,BSN 08/16/2020 1:57 PM

## 2020-08-16 NOTE — Telephone Encounter (Signed)
Call received from [facility] in cardiac rehab - pt presented for his scheduled appt after smoking a whole pack of cigarettes and is tachycardic. By her report, he is in sinus tachycardia. In review of recent EKGs, his HR does appear to be tachycardic. He is asymptomatic with HR in the 120s but SBP 98 - no room to titrate BB at this time. I think its reasonable to try the 6 min walk test, but with a low threshold to abort should he become symptomatic or increases HR above 140 range.    Roe Rutherford Jennife Zaucha, PA-C 08/16/2020, 10:22 AM

## 2020-08-16 NOTE — Progress Notes (Signed)
Patient here for cardiac rehab orientation resting heart rate 118-129 Sinus Tach. Mr Turman admits to already smoking one pack of cigarettes this morning.Blood pressure 98/72. Patient asymptomatic and denies having chest pain this morning. Angie Duke PA paged and notified. Angie said that Mr Joung can proceed with her 6 minute walk test as long as he is asymptomatic. I asked the patient to cut down on his cigarette intake before coming to exercise next week. Patient states understanding. Patient given the phone number to 1-800-quit-now.  Will forward today's exercise flow sheet walk test sheet to Dr Devin Going office for review.Gladstone Lighter, RN,BSN 08/16/2020 1:52 PM

## 2020-08-17 ENCOUNTER — Telehealth (HOSPITAL_COMMUNITY): Payer: Self-pay | Admitting: *Deleted

## 2020-08-17 NOTE — Telephone Encounter (Signed)
-----   Message from Meriam Sprague, MD sent at 08/17/2020 10:31 AM EDT ----- Regarding: RE: Sinus tach at cardiac rehab oreintation Good morning,  Completely, completely agree with you that smoking a pack of cigarettes before cardiac rehab as well as with his heart disease is probably not the best of plans. He has always been tachycardic in my office as well which is probably also due to smoking. I will call him today to plead with him to really cut back. He has bad disease and is young so hopefully we can motivate him to stop the smoking to ensure he does not have another event. I am okay with him exercising with HR 120 or below. Thank you again and have a great day.  Sincerely, Heather ----- Message ----- From: Cammy Copa, RN Sent: 08/17/2020   9:14 AM EDT To: Meriam Sprague, MD Subject: Sinus tach at cardiac rehab oreintation        Good morning Dr Shari Prows,  Mr Landgrebe here for cardiac rehab orientation resting heart rate 118-129 Sinus Tach. I called Angie Duke PA to notify. We proceeded with his 6 minute walk test. Mr Rebstock had already smoked a whole pack of cigarettes by 0930 when he arrived.  Mr Manlove completed his 6 minute walk test without complaints and denied chest pain his max heart rate was 134.. I faxed today's ECG tracings over for your review from  cardiac rehab orientation .  Mr Carreno will start exercise on Monday. What is an acceptable starting resting heart rate? His heart rate did not get lower than 118 resting. I did ask him to please cut back on the amount of cigarettes he smokes prior to coming to exercise.  I want you to be aware.  Thank you for your input!  Sincerely, Gladstone Lighter RN Cardiac Rehab

## 2020-08-19 ENCOUNTER — Other Ambulatory Visit: Payer: Self-pay | Admitting: Physician Assistant

## 2020-08-22 ENCOUNTER — Other Ambulatory Visit: Payer: Self-pay

## 2020-08-22 ENCOUNTER — Encounter (HOSPITAL_COMMUNITY)
Admission: RE | Admit: 2020-08-22 | Discharge: 2020-08-22 | Disposition: A | Discharge status: Home / Self Care | Payer: Medicaid Other | Source: Ambulatory Visit | Attending: Cardiology | Admitting: Cardiology

## 2020-08-22 DIAGNOSIS — Z955 Presence of coronary angioplasty implant and graft: Secondary | ICD-10-CM | POA: Insufficient documentation

## 2020-08-22 DIAGNOSIS — I214 Non-ST elevation (NSTEMI) myocardial infarction: Secondary | ICD-10-CM | POA: Diagnosis not present

## 2020-08-22 LAB — GLUCOSE, CAPILLARY
Glucose-Capillary: 202 mg/dL — ABNORMAL HIGH (ref 70–99)
Glucose-Capillary: 210 mg/dL — ABNORMAL HIGH (ref 70–99)

## 2020-08-22 NOTE — Progress Notes (Deleted)
Cardiology Office Note:    Date:  08/22/2020   ID:  Matthew [name], DOB 1976/02/27, MRN [id number]  PCP:  [name], MD   [facility]  Cardiologist:  Meriam Sprague, MD  Advanced Practice Provider:  No care team member to display Electrophysiologist:  None  :[id number]}   Referring MD: [name], MD    History of Present Illness:    Matthew [name] is a 45 y.o. male with a hx of HTN, HLD, pre-DM, celiac dz, schizoaffective d/o, anxiety, ADHD, OSAnot on CPAP, and recent NSTEMI s/p DES to pLAD and DES to dRCA who presents to clinic for follow-up.  Patient admitted with NSTEMI 06/06/20 treated with DES pLAD and DES dRCA, and severe LVEF 25-30%. Patient left AMA 1/18/21and was seen urgently in clinic by Jacolyn Reedy. During that visit, he was more SOB and had been eating a lot of fast food and had not filled his scripts for atenolol or lisinopril.Since that time, he has filled his prescriptions and has been taking all medications as prescribed. He understands the importance of taking his medications and that stopping his ASA or brilinta could lead to a massive MI and/or death. He states that since his hospitalization, he has been feeling overall okay.Occasionally has some palpitations with some short of breath and mild lightheadedness. Episodes occur about every other day and last a couple of minutes before resolving. Can happen at rest or with activity.No chest pain.   During visit on 06/22/20, the patient had been exercising more. Was having palpitations. He was referred for cardiac monitor and cardiac rehab.  Cardiac monitor showed: Patch Wear Time: 7 days and 17 hours (2022-02-12T16:36:22-0500 to 2022-02-20T10:06:03-499)  Patient had a min HR of 55 bpm, max HR of 135 bpm, and avg HR of 88 bpm. Predominant underlying rhythm was Sinus Rhythm. No Isolated SVEs, SVE Couplets, or SVE Triplets were present. Isolated VEs were rare (<1.0%), VE Couplets  were rare (<1.0%), and no      VE Triplets were present.   During visit on 07/20/20 he was having intermittent chest tightness. He has a subsequent ER visit on 08/10/20 for abdominal pain. Work-up reassuring and he was discharged in imdur 30mg  daily as well as nitro.  Has since started cardiac rehab. Continues to be very tachycardic usually related to heavy smoking.   Past Medical History:  Diagnosis Date  . Anxiety   . Celiac disease   . Coronary artery disease   . Dairy allergy   . Depression   . Diabetes mellitus without complication (HCC)   . High cholesterol   . Hypertension   . Paranoia (HCC)   . Pre-diabetes   . Schizoaffective disorder [facility])     Past Surgical History:  Procedure Laterality Date  . APPENDECTOMY    . BRAIN SURGERY    . CARDIAC CATHETERIZATION    . CORONARY STENT INTERVENTION N/A 06/06/2020   Procedure: CORONARY STENT INTERVENTION;  Surgeon: 06/08/2020, Peter M, MD;  Location: [facility] INVASIVE CV LAB;  Service: Cardiovascular;  Laterality: N/A;  prox LAD, distal RCA  . INTRAVASCULAR ULTRASOUND/IVUS N/A 06/06/2020   Procedure: Intravascular Ultrasound/IVUS;  Surgeon: 06/08/2020, Peter M, MD;  Location: [facility] INVASIVE CV LAB;  Service: Cardiovascular;  Laterality: N/A;  . LEFT HEART CATH AND CORONARY ANGIOGRAPHY N/A 06/06/2020   Procedure: LEFT HEART CATH AND CORONARY ANGIOGRAPHY;  Surgeon: 06/08/2020, Peter M, MD;  Location: [facility] INVASIVE CV LAB;  Service: Cardiovascular;  Laterality: N/A;  . NASAL SEPTUM  SURGERY    . WRIST FRACTURE SURGERY     right wrist    Current Medications: No outpatient medications have been marked as taking for the 08/31/20 encounter (Appointment) with Meriam SpraguePemberton, Angas Isabell E, MD.     Allergies:   Gluten meal   Social History   Socioeconomic History  . Marital status: Single    Spouse name: Not on file  . Number of children: 0  . Years of education: 3014  . Highest education level: Associate degree: occupational, Scientist, product/process developmenttechnical, or vocational program   Occupational History  . Occupation: [employer], unemployed  Tobacco Use  . Smoking status: Heavy Tobacco Smoker    Packs/day: 2.00    Types: Cigarettes  . Smokeless tobacco: Never Used  . Tobacco comment: patient given phone number to 1-800-quit now on 08/16/20  Vaping Use  . Vaping Use: Never used  Substance and Sexual Activity  . Alcohol use: No    Alcohol/week: 0.0 standard drinks    Comment: no (02/08/15)  . Drug use: No  . Sexual activity: Not on file  Other Topics Concern  . Not on file  Social History Narrative   [facility]   Social Determinants of Health   Financial Resource Strain: Not on file  Food Insecurity: Not on file  Transportation Needs: Not on file  Physical Activity: Not on file  Stress: Not on file  Social Connections: Not on file     Family History: The patient's ***family history includes Asthma in his mother; Hypertension in his father and mother.  ROS:   Please see the history of present illness.    *** All other systems reviewed and are negative.  EKGs/Labs/Other Studies Reviewed:    The following studies were reviewed today: Left Heart Cath 06/06/2020: 1. Severe 2 vessel obstructive CAD - 99% proximal LAD - 90% distal RCA 2. Severe LV dysfunction. EF estimated at 25-30%. Segmental wall motion abnormalities. 3. Moderately elevated LVEDP 4. Successful PCI of the proximal LAD with IVUS guidance and DES x 1 5. Successful PCI of the distal RCA with IVUS guidance and DES x 1  Cardiac monitor 07/19/20: Patch Wear Time: 7 days and 17 hours (2022-02-12T16:36:22-0500 to 2022-02-20T10:06:03-499)  Patient had a min HR of 55 bpm, max HR of 135 bpm, and avg HR of 88 bpm. Predominant underlying rhythm was Sinus Rhythm. No Isolated SVEs, SVE Couplets, or SVE Triplets were present. Isolated VEs were rare (<1.0%), VE Couplets were rare (<1.0%), and no  VE Triplets were present.   08/12/20 TTE: IMPRESSIONS  1. Left  ventricular ejection fraction, by estimation, is 65 to 70%. The  left ventricle has normal function. The left ventricle has no regional  wall motion abnormalities. There is mild left ventricular hypertrophy.  Left ventricular diastolic parameters  were normal.  2. Right ventricular systolic function is normal. The right ventricular  size is normal.  3. The mitral valve is normal in structure. No evidence of mitral valve  regurgitation. No evidence of mitral stenosis.  4. The aortic valve is normal in structure. Aortic valve regurgitation is  not visualized. No aortic stenosis is present.  5. The inferior vena cava is normal in size with greater than 50%  respiratory variability, suggesting right atrial pressure of 3 mmHg.   FINDINGS  Left Ventricle: Left ventricular ejection fraction, by estimation, is 65  to 70%. The left ventricle has normal function. The left ventricle has no  regional wall motion abnormalities. The left ventricular  internal cavity  size was normal in size. There is  mild left ventricular hypertrophy. Left ventricular diastolic parameters  were normal.   Right Ventricle: The right ventricular size is normal. Right ventricular  systolic function is normal.   Left Atrium: Left atrial size was normal in size.   Right Atrium: Right atrial size was normal in size.   Pericardium: There is no evidence of pericardial effusion.   Mitral Valve: The mitral valve is normal in structure. No evidence of  mitral valve regurgitation. No evidence of mitral valve stenosis.   Tricuspid Valve: The tricuspid valve is normal in structure. Tricuspid  valve regurgitation is trivial. No evidence of tricuspid stenosis.   Aortic Valve: The aortic valve is normal in structure. Aortic valve  regurgitation is not visualized. No aortic stenosis is present.   Pulmonic Valve: The pulmonic valve was normal in structure. Pulmonic valve  regurgitation is trivial. No evidence of  pulmonic stenosis.   Aorta: The aortic root is normal in size and structure.   Venous: The inferior vena cava is normal in size with greater than 50%  respiratory variability, suggesting right atrial pressure of 3 mmHg.   IAS/Shunts: No atrial level shunt detected by color flow Doppler.   EKG:  EKG is *** ordered today.  The ekg ordered today demonstrates ***  Recent Labs: 06/06/2020: TSH 2.644 06/07/2020: B Natriuretic Peptide 151.1; Magnesium 1.9 08/10/2020: Hemoglobin 17.0; Platelets 276 08/15/2020: ALT 37; BUN 12; Creatinine, Ser 1.23; Potassium 3.9; Sodium 130  Recent Lipid Panel    Component Value Date/Time   CHOL 133 08/15/2020 1526   TRIG 377.0 (H) 08/15/2020 1526   HDL 23.50 (L) 08/15/2020 1526   CHOLHDL 6 08/15/2020 1526   VLDL 75.4 (H) 08/15/2020 1526   LDLCALC UNABLE TO CALCULATE IF TRIGLYCERIDE OVER 400 mg/dL 78/46/9629 5284   LDLDIRECT 68.0 08/15/2020 1526     Risk Assessment/Calculations:   {Does this patient have ATRIAL FIBRILLATION?:[phone]}   Physical Exam:    VS:  There were no vitals taken for this visit.    Wt Readings from Last 3 Encounters:  08/16/20 248 lb 7.3 oz (112.7 kg)  08/15/20 249 lb (112.9 kg)  08/10/20 255 lb (115.7 kg)     GEN: *** Well nourished, well developed in no acute distress HEENT: Normal NECK: No JVD; No carotid bruits LYMPHATICS: No lymphadenopathy CARDIAC: ***RRR, no murmurs, rubs, gallops RESPIRATORY:  Clear to auscultation without rales, wheezing or rhonchi  ABDOMEN: Soft, non-tender, non-distended MUSCULOSKELETAL:  No edema; No deformity  SKIN: Warm and dry NEUROLOGIC:  Alert and oriented x 3 PSYCHIATRIC:  Normal affect   ASSESSMENT:    No diagnosis found. PLAN:    In order of problems listed above:  #NSTEMI: #Multivessel CAD: Patient [facility] to [facility]20 with significant chest pain found to have elevated troponin c/w NSTEMI. Underwent coronary angiography which revealed99% proximal LADand90%  distal RCAboth of which were treated successfuly with DES. LV gram with LVEF 25-30%. Unfortunately, patient left AMA overnight without optimization of medications. Now much more compliant and has been taking all medications as prescribed and followed up at all appointments. Symptoms much improved and increasing daily exercise. -Follow-up TTE -Continue ASA and brilinta -Continue lisinopril 20mg  daily--will change to entresto pending TTE -Continue coreg6.25mg  BID -Continuelipitor to 40mg  daily -Start spironolactone 12.5mg  daily -Start jardiance 10mg  daily -BMET next week -Patient enrolled in cardiac rehab -Counseled about diet and exercise  #Ischemic Cardiomyopathy with LVEF 25-30%: Left AMA prior to medication optimization and  TTE.EF approximately 25-30% on LV gram.Appears compensated on exam. Repeat TTE pending -Follow-up TTE -Continue lisinopril--will change to entresto pending TTE EF -Continue coregas above -Start spironolactone 12.5mg  daily and jardiance 10mg  daily as above -Low Na diet  #Palpitations: Improved. Cardiac monitor without significant ectopy. Rare PVCs.  -Continue coreg as above -Follow-up TTE as above  #DMII: A1C 12.6%. -Referred to Endocrinology -Start metformin 1000mg  daily -Start jardiance 10mg  daily  #OSA not on CPAP: -Follow-up with Pulm  #Tobacco abuse: -Cessation counseling -Start nicotine patches   {Are you ordering a CV Procedure (e.g. stress test, cath, DCCV, TEE, etc)?   Press F2        :    Medication Adjustments/Labs and Tests Ordered: Current medicines are reviewed at length with the patient today.  Concerns regarding medicines are outlined above.  No orders of the defined types were placed in this encounter.  No orders of the defined types were placed in this encounter.   There are no Patient Instructions on file for this visit.   Signed, , MD  08/22/2020 7:41 PM    [facility]

## 2020-08-22 NOTE — Progress Notes (Signed)
Daily Session Note  Patient Details  Name: Matthew [name] MRN: [id number] Date of Birth: May 05, 1976 Referring Provider:   Flowsheet Row CARDIAC REHAB PHASE II ORIENTATION from 08/16/2020 in [place]  Referring Provider Gwyndolyn Kaufman, MD      Encounter Date: 08/22/2020  Check In:  Session Check In - 08/22/20 1340      Check-In   Supervising physician immediately available to respond to emergencies [facility] immediately available    Physician(s) Dr. Florencia Reasons    Location [facility]    Staff Present Lesly Rubenstein, MS, EP-C, CCRP;Katrinka Herbison, RN, BSN;Carlette Carlton, RN, Deland Pretty, MS, ACSM CEP, Exercise Physiologist;Annedrea Rosezella [place], RN, MHA;Other   Esmeralda Links, EP   Virtual Visit No    Medication changes reported     No    Fall or balance concerns reported    No    Tobacco Cessation No Change    Current number of cigarettes/nicotine per day     40   Patient smokes 2 packs per day he says   Warm-up and Cool-down Performed on first and last piece of equipment    Resistance Training Performed Yes    VAD Patient? No    PAD/SET Patient? No      Pain Assessment   Currently in Pain? No/denies    Pain Score 0-No pain    Multiple Pain Sites No           Capillary Blood Glucose: Results for orders placed or performed during the hospital encounter of 08/22/20 (from the past 24 hour(s))  Glucose, capillary     Status: Abnormal   Collection Time: 08/22/20  1:38 PM  Result Value Ref Range   Glucose-Capillary 202 (H) 70 - 99 mg/dL  Glucose, capillary     Status: Abnormal   Collection Time: 08/22/20  2:24 PM  Result Value Ref Range   Glucose-Capillary 210 (H) 70 - 99 mg/dL     Exercise Prescription Changes - 08/22/20 1430      Response to Exercise   Blood Pressure (Admit) 110/74    Blood Pressure (Exercise) 128/78    Blood Pressure (Exit) 114/84    Heart Rate (Admit) 111 bpm    Heart Rate (Exercise)  121 bpm    Heart Rate (Exit) 103 bpm    Rating of Perceived Exertion (Exercise) 13    Symptoms None    Comments Pt's first day in the CRP2 program    Duration Progress to 30 minutes of  aerobic without signs/symptoms of physical distress    Intensity THRR unchanged      Progression   Progression Continue to progress workloads to maintain intensity without signs/symptoms of physical distress.    Average METs 1.9      Resistance Training   Training Prescription Yes    Weight 5 lbs    Reps 10-15    Time 10 Minutes      Interval Training   Interval Training No      T5 Nustep   Level 3    SPM 75    Minutes 30    METs 1.9           Social History   Tobacco Use  Smoking Status Heavy Tobacco Smoker  . Packs/day: 2.00  . Types: Cigarettes  Smokeless Tobacco Never Used  Tobacco Comment   patient given phone number to 1-800-quit now on 08/16/20    Goals Met:  Exercise tolerated well No report  of cardiac concerns or symptoms Strength training completed today  Goals Unmet:  Not Applicable  Comments: Matthew [name] started cardiac rehab on 08/23/20.  Pt tolerated light exercise without difficulty. VSS, telemetry-Sinus tach with a  first degree heart block, asymptomatic.  Medication list reconciled. Nick's resting heart rate was improved. Resting heart rate 111 pre exercise and 103 post.Pt denies barriers to medicaiton compliance.  PSYCHOSOCIAL ASSESSMENT:  PHQ-0. Patient does have a flat affect. QUALITY OF LIFE SCORE REVIEW  Pt completed Quality of Life survey as a participant in Cardiac Rehab. Scores 21.0 or below are considered low. Pt score very low in several areas Overall 12.00, Health and Function 10.50, socioeconomic 13.69, physiological and spiritual 15.14, family 12.90. Patient quality of life slightly altered by physical constraints which limits ability to perform as prior to recent cardiac illness. Matthew [name] lives alone and [facility] essential [facility].   Offered emotional support and reassurance.  Will continue to monitor and intervene as necessary. Will forward quality of life questionnaire to Matthew [name] [facility] at [facility]. Matthew [name] is currently receiving treatment and counseling.    Pt enjoys watching movies.   Pt oriented to exercise equipment and routine.    Understanding verbalized.Barnet Pall, RN,BSN 08/23/2020 9:02 AM   Dr. Fransico Him is Medical Director for Cardiac Rehab at [facility].

## 2020-08-24 ENCOUNTER — Encounter (HOSPITAL_COMMUNITY)
Admission: RE | Admit: 2020-08-24 | Discharge: 2020-08-24 | Disposition: A | Discharge status: Home / Self Care | Payer: Medicaid Other | Source: Ambulatory Visit | Attending: Cardiology | Admitting: Cardiology

## 2020-08-24 ENCOUNTER — Other Ambulatory Visit: Payer: Self-pay

## 2020-08-24 DIAGNOSIS — I214 Non-ST elevation (NSTEMI) myocardial infarction: Secondary | ICD-10-CM | POA: Diagnosis not present

## 2020-08-24 DIAGNOSIS — Z955 Presence of coronary angioplasty implant and graft: Secondary | ICD-10-CM

## 2020-08-24 LAB — GLUCOSE, CAPILLARY
Glucose-Capillary: 282 mg/dL — ABNORMAL HIGH (ref 70–99)
Glucose-Capillary: 286 mg/dL — ABNORMAL HIGH (ref 70–99)

## 2020-08-26 ENCOUNTER — Encounter (HOSPITAL_COMMUNITY)
Admission: RE | Admit: 2020-08-26 | Discharge: 2020-08-26 | Disposition: A | Discharge status: Home / Self Care | Payer: Medicaid Other | Source: Ambulatory Visit | Attending: Cardiology | Admitting: Cardiology

## 2020-08-26 ENCOUNTER — Other Ambulatory Visit: Payer: Self-pay

## 2020-08-26 ENCOUNTER — Telehealth: Payer: Self-pay | Admitting: Internal Medicine

## 2020-08-26 DIAGNOSIS — E1165 Type 2 diabetes mellitus with hyperglycemia: Secondary | ICD-10-CM

## 2020-08-26 DIAGNOSIS — Z955 Presence of coronary angioplasty implant and graft: Secondary | ICD-10-CM

## 2020-08-26 DIAGNOSIS — I214 Non-ST elevation (NSTEMI) myocardial infarction: Secondary | ICD-10-CM | POA: Diagnosis not present

## 2020-08-26 LAB — GLUCOSE, CAPILLARY: Glucose-Capillary: 301 mg/dL — ABNORMAL HIGH (ref 70–99)

## 2020-08-26 MED ORDER — TRESIBA FLEXTOUCH 100 UNIT/ML ~~LOC~~ SOPN: 16.0000 [IU] | PEN_INJECTOR | Freq: Every day | SUBCUTANEOUS | 11 refills | Status: DC

## 2020-08-26 NOTE — Progress Notes (Signed)
Pt unable to exercise due to high CBG today

## 2020-08-26 NOTE — Telephone Encounter (Signed)
Shamleffer, Ibtehal Jaralla, MD  [facility] Clinical Pool 15 minutes ago (3:42 PM)     I just contacted his pharmacy, picked up Metformin 3/18th. He did not pick up Jardiance , its ready for him to pick up with a co-pay of $3 . Basaglar needs PA , so I went ahead and changed it to [place] to see if this is covered. Pts with schizophrenia have a care giver or an patient advocate to help them with these things, and they may have to get involved, because from the endocrinology standpoint we can can authorize things but we are limited in keeping up with the pt if they don't go to the pharmacy to pick up their things ( Jardiance is ready but he has not picked it up )     

## 2020-08-26 NOTE — Telephone Encounter (Signed)
Spoken to patient and notified Dr Shamleffer's comments. Verbalized understanding.   

## 2020-08-26 NOTE — Telephone Encounter (Deleted)
Shamleffer, Konrad Dolores, MD  [facility] Clinical Pool 15 minutes ago (3:42 PM)     I just contacted his pharmacy, picked up Metformin 3/18th. He did not pick up Jardiance , its ready for him to pick up with a co-pay of $3 . Basaglar needs PA , so I went ahead and changed it to [place] to see if this is covered. Pts with schizophrenia have a care giver or an patient advocate to help them with these things, and they may have to get involved, because from the endocrinology standpoint we can can authorize things but we are limited in keeping up with the pt if they don't go to the pharmacy to pick up their things ( Jardiance is ready but he has not picked it up )

## 2020-08-26 NOTE — Telephone Encounter (Signed)
Ralene Muskrat (RN with Cardiac Rehab at [facility]) called regarding mutual pt. She states she has been seeing the pt for the past week (all notes are in Epic she states). On Monday and Wednesday his BS were in the 200s and today when she checked they were 301. Pt had chickfila with fries and a coke zero to eat for lunch. Pt told the nurse he does not check his BS at home and does not take insulin because his Medicaid won't pay for it. RN Byrd Hesselbach states he cannot participate in any exercise therapy as his BS must be under 300.  RN Byrd Hesselbach asked for [facility] to please advise and see what pt can do to lower BS and asks what alternatives Medicaid would cover for pt? I advised Byrd Hesselbach that it is the patient's job to [employer] and see what they will cover but she states pt is schizophrenic.

## 2020-08-26 NOTE — Progress Notes (Signed)
Incomplete Session Note  Patient Details  Name: TREON KEHL MRN: [id number] Date of Birth: 21-Aug-1975 Referring Provider:   Flowsheet Row CARDIAC REHAB PHASE II ORIENTATION from 08/16/2020 in [facility] CARDIAC [facility]  Referring Provider Laurance Flatten, MD      [name] did not complete his rehab session.  CBG 301 this afternoon. No exercise per protocol due to elevated CBG. [name] says he has not started his insulin because his insurance wont pay for it. Dr [facility] office called and notified about CBG and that the patient is not currently taking the insulin that was prescribed for him to take. Patient given water to drink. [name] said that he ate a chik-fil-a sandwich fires and a coke zero prior to coming to exercise at cardiac rehab.Gladstone Lighter, RN,BSN 08/26/2020 3:29 PM

## 2020-08-29 ENCOUNTER — Encounter (HOSPITAL_COMMUNITY): Payer: Medicaid Other

## 2020-08-31 ENCOUNTER — Encounter: Payer: Self-pay | Admitting: Cardiology

## 2020-08-31 ENCOUNTER — Ambulatory Visit (INDEPENDENT_AMBULATORY_CARE_PROVIDER_SITE_OTHER): Payer: Medicaid Other | Admitting: Cardiology

## 2020-08-31 ENCOUNTER — Encounter (HOSPITAL_COMMUNITY)
Admission: RE | Admit: 2020-08-31 | Discharge: 2020-08-31 | Disposition: A | Discharge status: Home / Self Care | Payer: Medicaid Other | Source: Ambulatory Visit | Attending: Cardiology | Admitting: Cardiology

## 2020-08-31 ENCOUNTER — Other Ambulatory Visit: Payer: Self-pay

## 2020-08-31 VITALS — BP 138/80 | HR 113 | Ht 74.0 in | Wt 255.8 lb

## 2020-08-31 DIAGNOSIS — I2510 Atherosclerotic heart disease of native coronary artery without angina pectoris: Secondary | ICD-10-CM

## 2020-08-31 DIAGNOSIS — R002 Palpitations: Secondary | ICD-10-CM

## 2020-08-31 DIAGNOSIS — E1165 Type 2 diabetes mellitus with hyperglycemia: Secondary | ICD-10-CM

## 2020-08-31 DIAGNOSIS — Z955 Presence of coronary angioplasty implant and graft: Secondary | ICD-10-CM

## 2020-08-31 DIAGNOSIS — I255 Ischemic cardiomyopathy: Secondary | ICD-10-CM

## 2020-08-31 DIAGNOSIS — F411 Generalized anxiety disorder: Secondary | ICD-10-CM

## 2020-08-31 DIAGNOSIS — I10 Essential (primary) hypertension: Secondary | ICD-10-CM

## 2020-08-31 DIAGNOSIS — I214 Non-ST elevation (NSTEMI) myocardial infarction: Secondary | ICD-10-CM

## 2020-08-31 DIAGNOSIS — I5022 Chronic systolic (congestive) heart failure: Secondary | ICD-10-CM

## 2020-08-31 LAB — GLUCOSE, CAPILLARY
Glucose-Capillary: 191 mg/dL — ABNORMAL HIGH (ref 70–99)
Glucose-Capillary: 154 mg/dL — ABNORMAL HIGH (ref 70–99)

## 2020-08-31 MED ORDER — CARVEDILOL 12.5 MG PO TABS
12.5000 mg | ORAL_TABLET | Freq: Two times a day (BID) | ORAL | 1 refills | Status: DC
Start: 2020-08-31 — End: 2020-10-10

## 2020-08-31 NOTE — Telephone Encounter (Signed)
Patient called stating the insurance is not covering the [place] either. I advised patient to give his insurance a call to see what they might cover instead. Patient said he would try that. Patient ph# [phone]

## 2020-08-31 NOTE — Progress Notes (Addendum)
Cardiology Office Note:    Date:  08/31/2020   ID:  Matthew BernNicholas J [name], DOB August 31, 1975, MRN [id number]  PCP:  Leilani Ableeese, Betti, MD   [facility]  Cardiologist:  Meriam SpragueHeather E Pemberton, MD  Advanced Practice Provider:  No care team member to display Electrophysiologist:  None  :[id number]}:[id number]}   Referring MD: Leilani Ableeese, Betti, MD    History of Present Illness:    Matthew Bernicholas J [name] is a 45 y.o. male with a hx of HTN, HLD, pre-DM, celiac dz, schizoaffective d/o, anxiety, ADHD, OSAnot on CPAP, and recent NSTEMI s/p DES to pLAD and DES to dRCA who presents to clinic for follow-up.  Patient admitted with NSTEMI 06/06/20 treated with DES pLAD and DES dRCA, and severe LVEF 25-30%. Patient left AMA 1/18/21and was seen urgently in clinic by Jacolyn ReedyMichele Lenze. During that visit, he was more SOB and had been eating a lot of fast food and had not filled his scripts for atenolol or lisinopril.Since that time, he has filled his prescriptions and has been taking all medications as prescribed. He understands the importance of taking his medications and that stopping his ASA or brilinta could lead to a massive MI and/or death. He states that since his hospitalization, he has been feeling overall okay.Occasionally has some palpitations with some short of breath and mild lightheadedness. Episodes occur about every other day and last a couple of minutes before resolving. Can happen at rest or with activity.No chest pain.   During visit on 06/22/20, the patient had been exercising more. Was having palpitations. He was referred for cardiac monitor and cardiac rehab.  Cardiac monitor showed: Patch Wear Time: 7 days and 17 hours (2022-02-12T16:36:22-0500 to 2022-02-20T10:06:03-499)  Patient had a min HR of 55 bpm, max HR of 135 bpm, and avg HR of 88 bpm. Predominant underlying rhythm was Sinus Rhythm. No Isolated SVEs, SVE Couplets, or SVE Triplets were present. Isolated VEs were rare (<1.0%), VE  Couplets were rare (<1.0%), and no      VE Triplets were present.   During visit on 07/20/20 he was having intermittent chest tightness. He has a subsequent ER visit on 08/10/20 for abdominal pain. Work-up reassuring and he was discharged in imdur 30mg  daily as well as nitro.  Has since started cardiac rehab. Continues to be very tachycardic usually related to heavy smoking (still smoking 2ppd). He is enjoying cardiac rehab, and his chest discomfort has resolved. Does not take his nitro or imdur. Also concerned that he is not picking up some of his prescribed mediations although he states he is taking everything else. He reports that his glucose levels are still elevated. Has not picked up the new script for jardiance and reports his insurance does not cover insulin so he has not started this yet. Endocrinology is currently working on trying to mitigate this.  Otherwise, his exertional shortness of breath has improved.  He denies having any exertional chest pain, tightness, or pressure. He has no lower extremity edema, lightheadedness, PND, or orthopnea.  Past Medical History:  Diagnosis Date  . Anxiety   . Celiac disease   . Coronary artery disease   . Dairy allergy   . Depression   . Diabetes mellitus without complication (HCC)   . High cholesterol   . Hypertension   . Paranoia (HCC)   . Pre-diabetes   . Schizoaffective disorder [facility](HCC)     Past Surgical History:  Procedure Laterality Date  . APPENDECTOMY    . BRAIN SURGERY    .  CARDIAC CATHETERIZATION    . CORONARY STENT INTERVENTION N/A 06/06/2020   Procedure: CORONARY STENT INTERVENTION;  Surgeon: Swaziland, Peter M, MD;  Location: [facility] INVASIVE CV LAB;  Service: Cardiovascular;  Laterality: N/A;  prox LAD, distal RCA  . INTRAVASCULAR ULTRASOUND/IVUS N/A 06/06/2020   Procedure: Intravascular Ultrasound/IVUS;  Surgeon: Swaziland, Peter M, MD;  Location: [facility] INVASIVE CV LAB;  Service: Cardiovascular;  Laterality: N/A;  . LEFT HEART CATH AND  CORONARY ANGIOGRAPHY N/A 06/06/2020   Procedure: LEFT HEART CATH AND CORONARY ANGIOGRAPHY;  Surgeon: Swaziland, Peter M, MD;  Location: [facility] INVASIVE CV LAB;  Service: Cardiovascular;  Laterality: N/A;  . NASAL SEPTUM SURGERY    . WRIST FRACTURE SURGERY     right wrist    Current Medications: Current Meds  Medication Sig  . aspirin EC 81 MG tablet Take 1 tablet (81 mg total) by mouth daily. Swallow whole.  Marland Kitchen atorvastatin (LIPITOR) 40 MG tablet Take 1 tablet (40 mg total) by mouth daily.  . carvedilol (COREG) 12.5 MG tablet Take 1 tablet (12.5 mg total) by mouth 2 (two) times daily with a meal.  . clonazePAM (KLONOPIN) 1 MG tablet Take 1 tablet (1 mg total) by mouth 3 (three) times daily as needed for anxiety.  . DULoxetine (CYMBALTA) 60 MG capsule TAKE 1 CAPSULE BY MOUTH 2 TIMES DAILY.  Marland Kitchen empagliflozin (JARDIANCE) 25 MG TABS tablet Take 1 tablet (25 mg total) by mouth daily before breakfast.  . gabapentin (NEURONTIN) 800 MG tablet TAKE 1 TABLET BY MOUTH THREE TIMES A DAY  . Insulin Pen Needle 32G X 4 MM MISC 1 Device by Does not apply route daily.  . isosorbide mononitrate (IMDUR) 30 MG 24 hr tablet Take 0.5 tablets (15 mg total) by mouth daily.  Marland Kitchen lisinopril (ZESTRIL) 20 MG tablet Take 1 tablet (20 mg total) by mouth daily.  Marland Kitchen lithium carbonate (LITHOBID) 300 MG CR tablet TAKE 2 TABLETS (600 MG TOTAL) BY MOUTH AT BEDTIME.  . metFORMIN (GLUCOPHAGE) 1000 MG tablet Take 1 tablet (1,000 mg total) by mouth 2 (two) times daily with a meal.  . nitroGLYCERIN (NITROSTAT) 0.4 MG SL tablet Place 1 tablet (0.4 mg total) under the tongue every 5 (five) minutes as needed for chest pain.  Marland Kitchen perphenazine (TRILAFON) 16 MG tablet Take 1 pill total 16 mg every morning and take 2 pills by mouth total 32 mg every bedtime  . spironolactone (ALDACTONE) 25 MG tablet Take 0.5 tablets (12.5 mg total) by mouth daily.  . ticagrelor (BRILINTA) 90 MG TABS tablet Take 1 tablet (90 mg total) by mouth 2 (two) times daily.  .  traZODone (DESYREL) 100 MG tablet TAKE 3 TABLETS BY MOUTH EVERY DAY AT BEDTIME AS NEEDED FOR SLEEP  . [DISCONTINUED] carvedilol (COREG) 6.25 MG tablet Take 1 tablet (6.25 mg total) by mouth 2 (two) times daily.     Allergies:   Gluten meal   Social History   Socioeconomic History  . Marital status: Single    Spouse name: Not on file  . Number of children: 0  . Years of education: 103  . Highest education level: Associate degree: occupational, Scientist, product/process development, or vocational program  Occupational History  . Occupation: [employer], unemployed  Tobacco Use  . Smoking status: Heavy Tobacco Smoker    Packs/day: 2.00    Types: Cigarettes  . Smokeless tobacco: Never Used  . Tobacco comment: patient given phone number to 1-800-quit now on 08/16/20  Vaping Use  . Vaping Use: Never used  Substance  and Sexual Activity  . Alcohol use: No    Alcohol/week: 0.0 standard drinks    Comment: no (02/08/15)  . Drug use: No  . Sexual activity: Not on file  Other Topics Concern  . Not on file  Social History Narrative   Pt lives at the [facility]   Social Determinants of Health   Financial Resource Strain: Not on file  Food Insecurity: Not on file  Transportation Needs: Not on file  Physical Activity: Not on file  Stress: Not on file  Social Connections: Not on file     Family History: The patient's family history includes Asthma in his mother; Hypertension in his father and mother.  ROS:   Please see the history of present illness.    Review of Systems  Constitutional: Negative for chills, diaphoresis and fever.  HENT: Negative for hearing loss.   Eyes: Negative for blurred vision and redness.  Respiratory: Negative for shortness of breath.   Cardiovascular: Positive for palpitations. Negative for chest pain, orthopnea, claudication, leg swelling and PND.  Gastrointestinal: Negative for nausea and vomiting.  Genitourinary: Negative for dysuria and flank pain.  Musculoskeletal: Negative  for falls.  Neurological: Negative for dizziness and loss of consciousness.  Psychiatric/Behavioral: Positive for depression. The patient is nervous/anxious.     EKGs/Labs/Other Studies Reviewed:    The following studies were reviewed today: Left Heart Cath 06/06/2020: 1. Severe 2 vessel obstructive CAD - 99% proximal LAD - 90% distal RCA 2. Severe LV dysfunction. EF estimated at 25-30%. Segmental wall motion abnormalities. 3. Moderately elevated LVEDP 4. Successful PCI of the proximal LAD with IVUS guidance and DES x 1 5. Successful PCI of the distal RCA with IVUS guidance and DES x 1  Cardiac monitor 07/19/20: Patch Wear Time: 7 days and 17 hours (2022-02-12T16:36:22-0500 to 2022-02-20T10:06:03-499)  Patient had a min HR of 55 bpm, max HR of 135 bpm, and avg HR of 88 bpm. Predominant underlying rhythm was Sinus Rhythm. No Isolated SVEs, SVE Couplets, or SVE Triplets were present. Isolated VEs were rare (<1.0%), VE Couplets were rare (<1.0%), and no  VE Triplets were present.   08/12/20 TTE: IMPRESSIONS  1. Left ventricular ejection fraction, by estimation, is 65 to 70%. The  left ventricle has normal function. The left ventricle has no regional  wall motion abnormalities. There is mild left ventricular hypertrophy.  Left ventricular diastolic parameters  were normal.  2. Right ventricular systolic function is normal. The right ventricular  size is normal.  3. The mitral valve is normal in structure. No evidence of mitral valve  regurgitation. No evidence of mitral stenosis.  4. The aortic valve is normal in structure. Aortic valve regurgitation is  not visualized. No aortic stenosis is present.  5. The inferior vena cava is normal in size with greater than 50%  respiratory variability, suggesting right atrial pressure of 3 mmHg.   FINDINGS  Left Ventricle: Left ventricular ejection fraction, by estimation, is 65  to 70%. The left ventricle has normal  function. The left ventricle has no  regional wall motion abnormalities. The left ventricular internal cavity  size was normal in size. There is  mild left ventricular hypertrophy. Left ventricular diastolic parameters  were normal.   Right Ventricle: The right ventricular size is normal. Right ventricular  systolic function is normal.   Left Atrium: Left atrial size was normal in size.   Right Atrium: Right atrial size was normal in size.   Pericardium: There is no evidence  of pericardial effusion.   Mitral Valve: The mitral valve is normal in structure. No evidence of  mitral valve regurgitation. No evidence of mitral valve stenosis.   Tricuspid Valve: The tricuspid valve is normal in structure. Tricuspid  valve regurgitation is trivial. No evidence of tricuspid stenosis.   Aortic Valve: The aortic valve is normal in structure. Aortic valve  regurgitation is not visualized. No aortic stenosis is present.   Pulmonic Valve: The pulmonic valve was normal in structure. Pulmonic valve  regurgitation is trivial. No evidence of pulmonic stenosis.   Aorta: The aortic root is normal in size and structure.   Venous: The inferior vena cava is normal in size with greater than 50%  respiratory variability, suggesting right atrial pressure of 3 mmHg.   IAS/Shunts: No atrial level shunt detected by color flow Doppler.   EKG:  08/31/20-sinus tach with HR 113, anterior infarct, lateral Q-waves   Recent Labs: 06/06/2020: TSH 2.644 06/07/2020: B Natriuretic Peptide 151.1; Magnesium 1.9 08/10/2020: Hemoglobin 17.0; Platelets 276 08/15/2020: ALT 37; BUN 12; Creatinine, Ser 1.23; Potassium 3.9; Sodium 130  Recent Lipid Panel    Component Value Date/Time   CHOL 133 08/15/2020 1526   TRIG 377.0 (H) 08/15/2020 1526   HDL 23.50 (L) 08/15/2020 1526   CHOLHDL 6 08/15/2020 1526   VLDL 75.4 (H) 08/15/2020 1526   LDLCALC UNABLE TO CALCULATE IF TRIGLYCERIDE OVER 400 mg/dL 40/98/1191 4782    LDLDIRECT 68.0 08/15/2020 1526     Physical Exam:    VS:  BP 138/80   Pulse (!) 113   Ht 6\' 2"  (1.88 m)   Wt 255 lb 12.8 oz (116 kg)   SpO2 99%   BMI 32.84 kg/m     Wt Readings from Last 3 Encounters:  08/31/20 255 lb 12.8 oz (116 kg)  08/16/20 248 lb 7.3 oz (112.7 kg)  08/15/20 249 lb (112.9 kg)     GEN: Disheveled but comfortable HEENT: Normal NECK: No JVD; No carotid bruits CARDIAC: tachycardic, regular, no murmurs, rubs, gallops RESPIRATORY: Diminished lungs sounds but clear  ABDOMEN: Soft, non-tender, non-distended MUSCULOSKELETAL:  No edema; No deformity  SKIN: Warm and dry NEUROLOGIC:  Alert and oriented x 3 PSYCHIATRIC:  Normal affect   ASSESSMENT:    1. Coronary artery disease involving native coronary artery of native heart without angina pectoris   2. Type 2 diabetes mellitus with hyperglycemia, without long-term current use of insulin (HCC)   3. Essential hypertension   4. Ischemic cardiomyopathy   5. Palpitations   6. Generalized anxiety disorder   7. Chronic systolic heart failure (HCC)    PLAN:    In order of problems listed above:  #NSTEMI: #Multivessel CAD: Patient presented [facility] on 06/06/20 with significant chest pain found to have elevated troponin c/w NSTEMI. Underwent coronary angiography which revealed99% proximal LADand90% distal RCAboth of which were treated successfuly with DES. LV gram with LVEF 25-30%. Unfortunately, patient left AMA overnight without optimization of medications. Now more compliant although appears he is stil intermittnetly missing doses of medications or not picking them up at his pharmacy (states he is taking everything but imdur currently). Fortunately, he has followed up at all appointments and attended Cardiac Rehab. Concerned that medication compliance will be an ongoing issue for him.   -TTE with recovered EF 65-70%, no significant WMA -Continue ASA and brilinta -Continue lisinopril 20mg  daily -Increase coreg to  12.5mg  BID -Continuelipitor to 40mg  daily -Continue spironolactone 12.5mg  daily -Continue jardiance 10mg  daily -Continue cardiac rehab -  Tobacco cessation counseling provided extensively today; patient states he wants to try to cut back -Counseled about diet and exercise -Needs continued encouragement about medication compliance  #Ischemic Cardiomyopathy with LVEF 25-30%: #Chronic Systolic heart faillure EF approximately 25-30% on LV gram-->now improved to 65-70% on TTE on 08/12/20. Euvolemic on examination.  -TTE with recovered EF 65-70% -Continue lisinopril  daily -Continue coregas above -Continue spironolactone 12.5mg  daily and jardiance  daily as above -Low Na diet  #Palpitations: Improved. Cardiac monitor without significant ectopy. Rare PVCs.  -Continue coreg as above  #DMII: A1C 12.6%. -Referred to Endocrinology; working getting insulin coverage -Continue metformin  daily -Continue jardiance  daily--supposed to be increased to  daily but patient has not picked up script--encouraged him to fill his medications  #OSA not on CPAP: -Follow-up with Pulm  #Tobacco abuse: -Cessation counseling -Continue nicotine patches -Needs extensive counseling going forward  #Schoizoaffective disorder: #Depression: -Management per psych   Medication Adjustments/Labs and Tests Ordered: Current medicines are reviewed at length with the patient today.  Concerns regarding medicines are outlined above.  Orders Placed This Encounter  Procedures  . EKG 12-Lead   Meds ordered this encounter  Medications  . carvedilol (COREG) 12.5 MG tablet    Sig: Take 1 tablet (12.5 mg total) by mouth 2 (two) times daily with a meal.    Dispense:  180 tablet    Refill:  1    DOSE INCREASE    Patient Instructions   Medication Instructions:   INCREASE YOUR CARVEDILOL (COREG) TO 12.5 MG BY MOUTH TWICE DAILY   PLEASE MAKE SURE YOU GO AND PICK UP YOUR DIABETES  MEDICATIONS AS PRESCRIBED BY YOUR ENDOCRINOLOGIST AND START TAKING THEM  *If you need a refill on your cardiac medications before your next appointment, please call your pharmacy*    Follow-Up:  2-3 MONTHS IN THE OFFICE WITH AN EXTENDER PER DR. Shari Prows     Other Instructions    Steps to Quit Smoking Smoking tobacco is the leading cause of preventable death. It can affect almost every organ in the body. Smoking puts you and those around you at risk for developing many serious chronic diseases. Quitting smoking can be difficult, but it is one of the best things that you can do for your health. It is never too late to quit. How do I get ready to quit? When you decide to quit smoking, create a plan to help you succeed. Before you quit:  Pick a date to quit. Set a date within the next 2 weeks to give you time to prepare.  Write down the reasons why you are quitting. Keep this list in places where you will see it often.  Tell your family, friends, and co-workers that you are quitting. Support from your loved ones can make quitting easier.  Talk with your health care provider about your options for quitting smoking.  Find out what treatment options are covered by your health insurance.  Identify people, places, things, and activities that make you want to smoke (triggers). Avoid them. What first steps can I take to quit smoking?  Throw away all cigarettes at home, at work, and in your car.  Throw away smoking accessories, such as Set designer.  Clean your car. Make sure to empty the ashtray.  Clean your home, including curtains and carpets. What strategies can I use to quit smoking? Talk with your health care provider about combining strategies, such as taking medicines while you are also receiving in-person counseling. Using  these two strategies together makes you more likely to succeed in quitting than if you used either strategy on its own.  If you are pregnant or  breastfeeding, talk with your health care provider about finding counseling or other support strategies to quit smoking. Do not take medicine to help you quit smoking unless your health care provider tells you to do so. To quit smoking: Quit right away  Quit smoking completely, instead of gradually reducing how much you smoke over a period of time. Research shows that stopping smoking right away is more successful than gradually quitting.  Attend in-person counseling to help you build problem-solving skills. You are more likely to succeed in quitting if you attend counseling sessions regularly. Even short sessions of 10 minutes can be effective. Take medicine You may take medicines to help you quit smoking. Some medicines require a prescription and some you can purchase over-the-counter. Medicines may have nicotine in them to replace the nicotine in cigarettes. Medicines may:  Help to stop cravings.  Help to relieve withdrawal symptoms. Your health care provider may recommend:  Nicotine patches, gum, or lozenges.  Nicotine inhalers or sprays.  Non-nicotine medicine that is taken by mouth. Find resources Find resources and support systems that can help you to quit smoking and remain smoke-free after you quit. These resources are most helpful when you use them often. They include:  Online chats with a Veterinary surgeon.  Telephone quitlines.  Printed Materials engineer.  Support groups or group counseling.  Text messaging programs.  Mobile phone apps or applications. Use apps that can help you stick to your quit plan by providing reminders, tips, and encouragement. There are many free apps for mobile devices as well as websites. Examples include Quit Guide from the [employer] and smokefree.gov   What things can I do to make it easier to quit?  Reach out to your family and friends for support and encouragement. Call telephone quitlines (1-800-QUIT-NOW), reach out to support groups, or work with a  counselor for support.  Ask people who smoke to avoid smoking around you.  Avoid places that trigger you to smoke, such as bars, parties, or smoke-break areas at work.  Spend time with people who do not smoke.  Lessen the stress in your life. Stress can be a smoking trigger for some people. To lessen stress, try: ? Exercising regularly. ? Doing deep-breathing exercises. ? Doing yoga. ? Meditating. ? Performing a body scan. This involves closing your eyes, scanning your body from head to toe, and noticing which parts of your body are particularly tense. Try to relax the muscles in those areas.   How will I feel when I quit smoking? Day 1 to 3 weeks Within the first 24 hours of quitting smoking, you may start to feel withdrawal symptoms. These symptoms are usually most noticeable 2-3 days after quitting, but they usually do not last for more than 2-3 weeks. You may experience these symptoms:  Mood swings.  Restlessness, anxiety, or irritability.  Trouble concentrating.  Dizziness.  Strong cravings for sugary foods and nicotine.  Mild weight gain.  Constipation.  Nausea.  Coughing or a sore throat.  Changes in how the medicines that you take for unrelated issues work in your body.  Depression.  Trouble sleeping (insomnia). Week 3 and afterward After the first 2-3 weeks of quitting, you may start to notice more positive results, such as:  Improved sense of smell and taste.  Decreased coughing and sore throat.  Slower heart rate.  Lower blood pressure.  Clearer skin.  The ability to breathe more easily.  Fewer sick days. Quitting smoking can be very challenging. Do not get discouraged if you are not successful the first time. Some people need to make many attempts to quit before they achieve long-term success. Do your best to stick to your quit plan, and talk with your health care provider if you have any questions or concerns. Summary  Smoking tobacco is the  leading cause of preventable death. Quitting smoking is one of the best things that you can do for your health.  When you decide to quit smoking, create a plan to help you succeed.  Quit smoking right away, not slowly over a period of time.  When you start quitting, seek help from your health care provider, family, or friends. This information is not intended to replace advice given to you by your health care provider. Make sure you discuss any questions you have with your health care provider. Document Revised: 01/30/2019 Document Reviewed: 07/26/2018 Elsevier Patient Education  2021 [employer].     Follow in 2 months   I,Alexis Presenter, broadcasting as a Neurosurgeon for Meriam Sprague, MD.,have documented all relevant documentation on the behalf of Meriam Sprague, MD,as directed by  Meriam Sprague, MD while in the presence of Meriam Sprague, MD.  I, Meriam Sprague, MD, have reviewed all documentation for this visit. The documentation on 08/31/20 for the exam, diagnosis, procedures, and orders are all accurate and complete.  Signed, Meriam Sprague, MD  08/31/2020 10:43 AM    [facility]

## 2020-08-31 NOTE — Patient Instructions (Addendum)
Medication Instructions:   INCREASE YOUR CARVEDILOL (COREG) TO 12.5 MG BY MOUTH TWICE DAILY   PLEASE MAKE SURE YOU GO AND PICK UP YOUR DIABETES MEDICATIONS AS PRESCRIBED BY YOUR ENDOCRINOLOGIST AND START TAKING THEM  *If you need a refill on your cardiac medications before your next appointment, please call your pharmacy*    Follow-Up:  2-3 MONTHS IN THE OFFICE WITH AN EXTENDER PER DR. Shari Prows     Other Instructions    Steps to Quit Smoking Smoking tobacco is the leading cause of preventable death. It can affect almost every organ in the body. Smoking puts you and those around you at risk for developing many serious chronic diseases. Quitting smoking can be difficult, but it is one of the best things that you can do for your health. It is never too late to quit. How do I get ready to quit? When you decide to quit smoking, create a plan to help you succeed. Before you quit:  Pick a date to quit. Set a date within the next 2 weeks to give you time to prepare.  Write down the reasons why you are quitting. Keep this list in places where you will see it often.  Tell your family, friends, and co-workers that you are quitting. Support from your loved ones can make quitting easier.  Talk with your health care provider about your options for quitting smoking.  Find out what treatment options are covered by your health insurance.  Identify people, places, things, and activities that make you want to smoke (triggers). Avoid them. What first steps can I take to quit smoking?  Throw away all cigarettes at home, at work, and in your car.  Throw away smoking accessories, such as Set designer.  Clean your car. Make sure to empty the ashtray.  Clean your home, including curtains and carpets. What strategies can I use to quit smoking? Talk with your health care provider about combining strategies, such as taking medicines while you are also receiving in-person counseling.  Using these two strategies together makes you more likely to succeed in quitting than if you used either strategy on its own.  If you are pregnant or breastfeeding, talk with your health care provider about finding counseling or other support strategies to quit smoking. Do not take medicine to help you quit smoking unless your health care provider tells you to do so. To quit smoking: Quit right away  Quit smoking completely, instead of gradually reducing how much you smoke over a period of time. Research shows that stopping smoking right away is more successful than gradually quitting.  Attend in-person counseling to help you build problem-solving skills. You are more likely to succeed in quitting if you attend counseling sessions regularly. Even short sessions of 10 minutes can be effective. Take medicine You may take medicines to help you quit smoking. Some medicines require a prescription and some you can purchase over-the-counter. Medicines may have nicotine in them to replace the nicotine in cigarettes. Medicines may:  Help to stop cravings.  Help to relieve withdrawal symptoms. Your health care provider may recommend:  Nicotine patches, gum, or lozenges.  Nicotine inhalers or sprays.  Non-nicotine medicine that is taken by mouth. Find resources Find resources and support systems that can help you to quit smoking and remain smoke-free after you quit. These resources are most helpful when you use them often. They include:  Online chats with a Veterinary surgeon.  Telephone quitlines.  Printed Materials engineer.  Support groups  or group counseling.  Text messaging programs.  Mobile phone apps or applications. Use apps that can help you stick to your quit plan by providing reminders, tips, and encouragement. There are many free apps for mobile devices as well as websites. Examples include Quit Guide from the [employer] and smokefree.gov   What things can I do to make it easier to  quit?  Reach out to your family and friends for support and encouragement. Call telephone quitlines (1-800-QUIT-NOW), reach out to support groups, or work with a counselor for support.  Ask people who smoke to avoid smoking around you.  Avoid places that trigger you to smoke, such as bars, parties, or smoke-break areas at work.  Spend time with people who do not smoke.  Lessen the stress in your life. Stress can be a smoking trigger for some people. To lessen stress, try: ? Exercising regularly. ? Doing deep-breathing exercises. ? Doing yoga. ? Meditating. ? Performing a body scan. This involves closing your eyes, scanning your body from head to toe, and noticing which parts of your body are particularly tense. Try to relax the muscles in those areas.   How will I feel when I quit smoking? Day 1 to 3 weeks Within the first 24 hours of quitting smoking, you may start to feel withdrawal symptoms. These symptoms are usually most noticeable 2-3 days after quitting, but they usually do not last for more than 2-3 weeks. You may experience these symptoms:  Mood swings.  Restlessness, anxiety, or irritability.  Trouble concentrating.  Dizziness.  Strong cravings for sugary foods and nicotine.  Mild weight gain.  Constipation.  Nausea.  Coughing or a sore throat.  Changes in how the medicines that you take for unrelated issues work in your body.  Depression.  Trouble sleeping (insomnia). Week 3 and afterward After the first 2-3 weeks of quitting, you may start to notice more positive results, such as:  Improved sense of smell and taste.  Decreased coughing and sore throat.  Slower heart rate.  Lower blood pressure.  Clearer skin.  The ability to breathe more easily.  Fewer sick days. Quitting smoking can be very challenging. Do not get discouraged if you are not successful the first time. Some people need to make many attempts to quit before they achieve long-term  success. Do your best to stick to your quit plan, and talk with your health care provider if you have any questions or concerns. Summary  Smoking tobacco is the leading cause of preventable death. Quitting smoking is one of the best things that you can do for your health.  When you decide to quit smoking, create a plan to help you succeed.  Quit smoking right away, not slowly over a period of time.  When you start quitting, seek help from your health care provider, family, or friends. This information is not intended to replace advice given to you by your health care provider. Make sure you discuss any questions you have with your health care provider. Document Revised: 01/30/2019 Document Reviewed: 07/26/2018 Elsevier Patient Education  2021 [employer].

## 2020-08-31 NOTE — Telephone Encounter (Signed)
Just received fax request for prior auth for [employer]. Place form in [facility] inbox.

## 2020-09-01 NOTE — Telephone Encounter (Signed)
Done

## 2020-09-02 ENCOUNTER — Other Ambulatory Visit: Payer: Self-pay

## 2020-09-02 ENCOUNTER — Encounter (HOSPITAL_COMMUNITY)
Admission: RE | Admit: 2020-09-02 | Discharge: 2020-09-02 | Disposition: A | Discharge status: Home / Self Care | Payer: Medicaid Other | Source: Ambulatory Visit | Attending: Cardiology | Admitting: Cardiology

## 2020-09-02 DIAGNOSIS — Z955 Presence of coronary angioplasty implant and graft: Secondary | ICD-10-CM

## 2020-09-02 DIAGNOSIS — I214 Non-ST elevation (NSTEMI) myocardial infarction: Secondary | ICD-10-CM | POA: Diagnosis not present

## 2020-09-05 ENCOUNTER — Encounter (HOSPITAL_COMMUNITY)
Admission: RE | Admit: 2020-09-05 | Discharge: 2020-09-05 | Disposition: A | Discharge status: Home / Self Care | Payer: Medicaid Other | Source: Ambulatory Visit | Attending: Cardiology | Admitting: Cardiology

## 2020-09-05 ENCOUNTER — Other Ambulatory Visit: Payer: Self-pay

## 2020-09-05 ENCOUNTER — Telehealth: Payer: Self-pay | Admitting: Internal Medicine

## 2020-09-05 DIAGNOSIS — Z955 Presence of coronary angioplasty implant and graft: Secondary | ICD-10-CM

## 2020-09-05 DIAGNOSIS — I214 Non-ST elevation (NSTEMI) myocardial infarction: Secondary | ICD-10-CM

## 2020-09-05 LAB — GLUCOSE, CAPILLARY
Glucose-Capillary: 145 mg/dL — ABNORMAL HIGH (ref 70–99)
Glucose-Capillary: 325 mg/dL — ABNORMAL HIGH (ref 70–99)

## 2020-09-05 NOTE — Telephone Encounter (Signed)
Pt's cardiac rehab called to just give [place] an FYI pt was not able to work out today because his Blood Sugars were 324 due to him having [employer] lemonade

## 2020-09-05 NOTE — Telephone Encounter (Signed)
PA have faxed to Medicaid [facility]) on 09/01/2020

## 2020-09-05 NOTE — Progress Notes (Signed)
Matthew [name] 45 y.o. male Nutrition Note  Diagnosis:NSTEMI, S/P  DES RCA, LAD  Past Medical History:  Diagnosis Date  . Anxiety   . Celiac disease   . Coronary artery disease   . Dairy allergy   . Depression   . Diabetes mellitus without complication (HCC)   . High cholesterol   . Hypertension   . Paranoia (HCC)   . Pre-diabetes   . Schizoaffective disorder (HCC)      Medications reviewed.   Current Outpatient Medications:  .  aspirin EC 81 MG tablet, Take 1 tablet (81 mg total) by mouth daily. Swallow whole., Disp: 90 tablet, Rfl: 3 .  atorvastatin (LIPITOR) 40 MG tablet, Take 1 tablet (40 mg total) by mouth daily., Disp: 90 tablet, Rfl: 3 .  carvedilol (COREG) 12.5 MG tablet, Take 1 tablet (12.5 mg total) by mouth 2 (two) times daily with a meal., Disp: 180 tablet, Rfl: 1 .  clonazePAM (KLONOPIN) 1 MG tablet, Take 1 tablet (1 mg total) by mouth 3 (three) times daily as needed for anxiety., Disp: 90 tablet, Rfl: 1 .  DULoxetine (CYMBALTA) 60 MG capsule, TAKE 1 CAPSULE BY MOUTH 2 TIMES DAILY., Disp: 180 capsule, Rfl: 1 .  empagliflozin (JARDIANCE) 25 MG TABS tablet, Take 1 tablet (25 mg total) by mouth daily before breakfast., Disp: 90 tablet, Rfl: 1 .  gabapentin (NEURONTIN) 800 MG tablet, TAKE 1 TABLET BY MOUTH THREE TIMES A DAY, Disp: 270 tablet, Rfl: 1 .  insulin degludec (TRESIBA FLEXTOUCH) 100 UNIT/ML FlexTouch Pen, Inject 16 Units into the skin daily. (Patient not taking: Reported on 08/31/2020), Disp: 15 mL, Rfl: 11 .  Insulin Pen Needle 32G X 4 MM MISC, 1 Device by Does not apply route daily., Disp: 150 each, Rfl: 3 .  isosorbide mononitrate (IMDUR) 30 MG 24 hr tablet, Take 0.5 tablets (15 mg total) by mouth daily., Disp: 30 tablet, Rfl: 0 .  lisinopril (ZESTRIL) 20 MG tablet, Take 1 tablet (20 mg total) by mouth daily., Disp: 90 tablet, Rfl: 3 .  lithium carbonate (LITHOBID) 300 MG CR tablet, TAKE 2 TABLETS (600 MG TOTAL) BY MOUTH AT BEDTIME., Disp: 180 tablet, Rfl:  1 .  metFORMIN (GLUCOPHAGE) 1000 MG tablet, Take 1 tablet (1,000 mg total) by mouth 2 (two) times daily with a meal., Disp: 180 tablet, Rfl: 3 .  nitroGLYCERIN (NITROSTAT) 0.4 MG SL tablet, Place 1 tablet (0.4 mg total) under the tongue every 5 (five) minutes as needed for chest pain., Disp: 100 tablet, Rfl: 3 .  perphenazine (TRILAFON) 16 MG tablet, Take 1 pill total 16 mg every morning and take 2 pills by mouth total 32 mg every bedtime, Disp: 270 tablet, Rfl: 1 .  spironolactone (ALDACTONE) 25 MG tablet, Take 0.5 tablets (12.5 mg total) by mouth daily., Disp: 45 tablet, Rfl: 3 .  ticagrelor (BRILINTA) 90 MG TABS tablet, Take 1 tablet (90 mg total) by mouth 2 (two) times daily., Disp: 60 tablet, Rfl: 11 .  traZODone (DESYREL) 100 MG tablet, TAKE 3 TABLETS BY MOUTH EVERY DAY AT BEDTIME AS NEEDED FOR SLEEP, Disp: 270 tablet, Rfl: 1   Ht Readings from Last 1 Encounters:  08/31/20 6\' 2"  (1.88 m)     Wt Readings from Last 3 Encounters:  08/31/20 255 lb 12.8 oz (116 kg)  08/16/20 248 lb 7.3 oz (112.7 kg)  08/15/20 249 lb (112.9 kg)     There is no height or weight on file to calculate BMI.   Social History  Tobacco Use  Smoking Status Heavy Tobacco Smoker  . Packs/day: 2.00  . Types: Cigarettes  Smokeless Tobacco Never Used  Tobacco Comment   patient given phone number to 1-800-quit now on 08/16/20     Lab Results  Component Value Date   CHOL 133 08/15/2020   Lab Results  Component Value Date   HDL 23.50 (L) 08/15/2020   Lab Results  Component Value Date   LDLCALC UNABLE TO CALCULATE IF TRIGLYCERIDE OVER 400 mg/dL 16/60/6301   Lab Results  Component Value Date   TRIG 377.0 (H) 08/15/2020     Lab Results  Component Value Date   HGBA1C 12.3 (A) 08/15/2020     CBG (last 3)  No results for input(s): GLUCAP in the last 72 hours.   Nutrition Note  Spoke with pt. Nutrition Plan and Nutrition Survey goals reviewed with pt.   Pt has Type 2 Diabetes. Pt does not check  CBGs at home. He states his lancing device does not puncture finger. He is going to bring his meter and supplies to rehab for assessment of technique.  He states he has been referred for DSMT and plans to go when it occurs. He was able to knowledgeable discuss foods that raise his blood sugars including drinking sugary beverages. Today his CBG was >300 mg/dl after drinking a pepsi and lemonade. He states he urinates often. He is taking all diabetes medications now. We discussed importance of blood sugar monitoring.  He also has a degree in [employer] but states he does not cook because he is burnt out.   Pt with dx of CHF. Per discussion, pt does use canned/convenience foods often. Pt does not add salt to food. Pt does eat out frequently.  Diet recall: Breakfast: mcdonalds BEC biscuit and hasbrown Lunch: [employer] canned chili or a frozen chicken pot pie Dinner: Takeout - thai food or subway Drinks: water, sodas, lemonade  Pt expressed understanding of the information reviewed.   Nutrition Diagnosis ? Excessive carbohydrate intake related to consumption of convenience foods and sugary beverages as evidenced by A1C 12.3 and CBGs >300 mg/dl and triglycerides 601 mg/dl  Nutrition Intervention ? Pt's individual nutrition plan reviewed with pt. ? Carbohydrate counting, diabetes meal planning, reduced sodium diet   ? Continue client-centered nutrition education by RD, as part of interdisciplinary care.  Goal(s) ? Pt to build a healthy plate including vegetables, fruits, whole grains, and low-fat dairy products in a heart healthy meal plan. ? CBG concentrations in the normal range or as close to normal as is safely possible. ? Improved blood glucose control as evidenced by pt's A1c trending from 12.3 toward less than 7.0.  Plan:   Will provide client-centered nutrition education as part of interdisciplinary care  Monitor and evaluate progress toward nutrition goal with team.   Andrey Campanile, MS, RDN, LDN

## 2020-09-05 NOTE — Progress Notes (Signed)
Incomplete Session Note  Patient Details  Name: Matthew [name] MRN: [id number] Date of Birth: [birth year] Referring Provider:   Flowsheet Row CARDIAC REHAB PHASE II ORIENTATION from 08/16/2020 in [facility]ARDI[facility]  Referring Provider Laurance Flatten, MD      [name] did not complete his rehab session.  CBG 324 this afternoon. No exercise per protocol. Patient was counseled by the dietitian. [name] said that he drank lemonade prior to coming to exercise at cardiac rehab. Patient advised not to drink sugary drinks prior to exercising at cardiac rehab. Will notify Dr [facility] office.Gladstone Lighter, RN,BSN 09/05/2020 2:01 PM

## 2020-09-07 ENCOUNTER — Other Ambulatory Visit: Payer: Self-pay

## 2020-09-07 ENCOUNTER — Encounter (HOSPITAL_COMMUNITY)
Admission: RE | Admit: 2020-09-07 | Discharge: 2020-09-07 | Disposition: A | Discharge status: Home / Self Care | Payer: Medicaid Other | Source: Ambulatory Visit | Attending: Cardiology | Admitting: Cardiology

## 2020-09-07 DIAGNOSIS — I214 Non-ST elevation (NSTEMI) myocardial infarction: Secondary | ICD-10-CM

## 2020-09-07 DIAGNOSIS — Z955 Presence of coronary angioplasty implant and graft: Secondary | ICD-10-CM

## 2020-09-08 ENCOUNTER — Other Ambulatory Visit: Payer: Self-pay

## 2020-09-08 ENCOUNTER — Telehealth: Payer: Self-pay | Admitting: Physician Assistant

## 2020-09-08 DIAGNOSIS — F902 Attention-deficit hyperactivity disorder, combined type: Secondary | ICD-10-CM

## 2020-09-08 DIAGNOSIS — F250 Schizoaffective disorder, bipolar type: Secondary | ICD-10-CM

## 2020-09-08 DIAGNOSIS — F411 Generalized anxiety disorder: Secondary | ICD-10-CM

## 2020-09-08 MED ORDER — TRAZODONE HCL 100 MG PO TABS: ORAL_TABLET | ORAL | 0 refills | Status: DC

## 2020-09-08 MED ORDER — DULOXETINE HCL 60 MG PO CPEP: 60.0000 mg | ORAL_CAPSULE | Freq: Two times a day (BID) | ORAL | 0 refills | Status: DC

## 2020-09-08 NOTE — Telephone Encounter (Signed)
Rx sent 

## 2020-09-08 NOTE — Telephone Encounter (Signed)
Pt called and said that he needs a refill on his trazadone and his cymbalta. He has refills under [facility] but insurance will not fill since he is retired. [facility] at [address]. Pt is completely out

## 2020-09-08 NOTE — Telephone Encounter (Signed)
Noted  

## 2020-09-08 NOTE — Progress Notes (Signed)
Reviewed home exercise Rx with patient today. Pt is not currently walking at home but was encouraged to begin to do so. Encouraged 30-45 minutes of walking 2-3 x/week. Encouraged warm-up, cool-down, and stretching. Reviewed THRR of 71-142 and keeping REP between 11-13. Hydration was encouraged. Discussed weather parameters for temperature and humidity for safe exercise outdoors. Reviewed S/S to terminate exercise and when to call 911 vs MD.  Reviewed proper use of NTG through teach-back method. Pt encouraged to carry Cell phone and rapid acting glucose snack if walking out doors. Pt verbalized understanding of the home exercise Rx and was provided a copy.   Lorin Picket MS, ACSM-CEP, CCRP

## 2020-09-09 ENCOUNTER — Encounter (HOSPITAL_COMMUNITY)
Admission: RE | Admit: 2020-09-09 | Discharge: 2020-09-09 | Disposition: A | Discharge status: Home / Self Care | Payer: Medicaid Other | Source: Ambulatory Visit | Attending: Cardiology | Admitting: Cardiology

## 2020-09-09 ENCOUNTER — Emergency Department (HOSPITAL_COMMUNITY): Payer: Medicaid Other

## 2020-09-09 ENCOUNTER — Emergency Department (HOSPITAL_COMMUNITY)
Admission: EM | Admit: 2020-09-09 | Discharge: 2020-09-10 | Disposition: A | Discharge status: Home / Self Care | Payer: Medicaid Other | Attending: Emergency Medicine | Admitting: Emergency Medicine

## 2020-09-09 ENCOUNTER — Other Ambulatory Visit: Payer: Self-pay

## 2020-09-09 ENCOUNTER — Encounter (HOSPITAL_COMMUNITY): Payer: Self-pay | Admitting: *Deleted

## 2020-09-09 DIAGNOSIS — Z955 Presence of coronary angioplasty implant and graft: Secondary | ICD-10-CM

## 2020-09-09 DIAGNOSIS — I10 Essential (primary) hypertension: Secondary | ICD-10-CM | POA: Diagnosis not present

## 2020-09-09 DIAGNOSIS — Z7984 Long term (current) use of oral hypoglycemic drugs: Secondary | ICD-10-CM | POA: Insufficient documentation

## 2020-09-09 DIAGNOSIS — M549 Dorsalgia, unspecified: Secondary | ICD-10-CM | POA: Insufficient documentation

## 2020-09-09 DIAGNOSIS — R079 Chest pain, unspecified: Secondary | ICD-10-CM | POA: Diagnosis present

## 2020-09-09 DIAGNOSIS — I2510 Atherosclerotic heart disease of native coronary artery without angina pectoris: Secondary | ICD-10-CM | POA: Insufficient documentation

## 2020-09-09 DIAGNOSIS — I214 Non-ST elevation (NSTEMI) myocardial infarction: Secondary | ICD-10-CM | POA: Diagnosis not present

## 2020-09-09 DIAGNOSIS — Z79899 Other long term (current) drug therapy: Secondary | ICD-10-CM | POA: Insufficient documentation

## 2020-09-09 DIAGNOSIS — R630 Anorexia: Secondary | ICD-10-CM | POA: Diagnosis not present

## 2020-09-09 DIAGNOSIS — Z7982 Long term (current) use of aspirin: Secondary | ICD-10-CM | POA: Diagnosis not present

## 2020-09-09 DIAGNOSIS — F17210 Nicotine dependence, cigarettes, uncomplicated: Secondary | ICD-10-CM | POA: Insufficient documentation

## 2020-09-09 DIAGNOSIS — E119 Type 2 diabetes mellitus without complications: Secondary | ICD-10-CM | POA: Insufficient documentation

## 2020-09-09 DIAGNOSIS — R109 Unspecified abdominal pain: Secondary | ICD-10-CM | POA: Insufficient documentation

## 2020-09-09 DIAGNOSIS — R000 Tachycardia, unspecified: Secondary | ICD-10-CM | POA: Insufficient documentation

## 2020-09-09 DIAGNOSIS — R110 Nausea: Secondary | ICD-10-CM | POA: Insufficient documentation

## 2020-09-09 DIAGNOSIS — Z794 Long term (current) use of insulin: Secondary | ICD-10-CM | POA: Insufficient documentation

## 2020-09-09 LAB — BASIC METABOLIC PANEL
Anion gap: 14 (ref 5–15)
BUN: 12 mg/dL (ref 6–20)
CO2: 20 mmol/L — ABNORMAL LOW (ref 22–32)
Calcium: 10.2 mg/dL (ref 8.9–10.3)
Chloride: 96 mmol/L — ABNORMAL LOW (ref 98–111)
Creatinine, Ser: 0.97 mg/dL (ref 0.61–1.24)
GFR, Estimated: >60 mL/min (ref >60)
Glucose, Bld: 136 mg/dL — ABNORMAL HIGH (ref 70–99)
Potassium: 4.1 mmol/L (ref 3.5–5.1)
Sodium: 130 mmol/L — ABNORMAL LOW (ref 135–145)

## 2020-09-09 LAB — CBC WITH DIFFERENTIAL/PLATELET
Abs Immature Granulocytes: 0.14 10*3/uL — ABNORMAL HIGH (ref 0.00–0.07)
Basophils Absolute: 0.1 10*3/uL (ref 0.0–0.1)
Basophils Relative: 1 %
Eosinophils Absolute: 0.4 10*3/uL (ref 0.0–0.5)
Eosinophils Relative: 2 %
HCT: 52.6 % — ABNORMAL HIGH (ref 39.0–52.0)
Hemoglobin: 17.8 g/dL — ABNORMAL HIGH (ref 13.0–17.0)
Immature Granulocytes: 1 %
Lymphocytes Relative: 16 %
Lymphs Abs: 3.2 10*3/uL (ref 0.7–4.0)
MCH: 28.4 pg (ref 26.0–34.0)
MCHC: 33.8 g/dL (ref 30.0–36.0)
MCV: 83.9 fL (ref 80.0–100.0)
Monocytes Absolute: 1.1 10*3/uL — ABNORMAL HIGH (ref 0.1–1.0)
Monocytes Relative: 6 %
Neutro Abs: 15 10*3/uL — ABNORMAL HIGH (ref 1.7–7.7)
Neutrophils Relative %: 74 %
Platelets: 314 10*3/uL (ref 150–400)
RBC: 6.27 MIL/uL — ABNORMAL HIGH (ref 4.22–5.81)
RDW: 14.5 % (ref 11.5–15.5)
WBC: 19.9 10*3/uL — ABNORMAL HIGH (ref 4.0–10.5)
nRBC: 0 % (ref 0.0–0.2)

## 2020-09-09 LAB — TROPONIN I (HIGH SENSITIVITY)
Troponin I (High Sensitivity): 4 ng/L (ref <18)
Troponin I (High Sensitivity): 4 ng/L (ref <18)

## 2020-09-09 LAB — GLUCOSE, CAPILLARY: Glucose-Capillary: 187 mg/dL — ABNORMAL HIGH (ref 70–99)

## 2020-09-09 LAB — D-DIMER, QUANTITATIVE: D-Dimer, Quant: <0.27 ug/mL-FEU (ref 0.00–0.50)

## 2020-09-09 IMAGING — DX DG CHEST 2V
3 series · 3 of 3 positions shown · non-contrast
Comparison: [DATE]

CLINICAL DATA: Chest pain

EXAM:
CHEST - 2 VIEW

[chest pa]
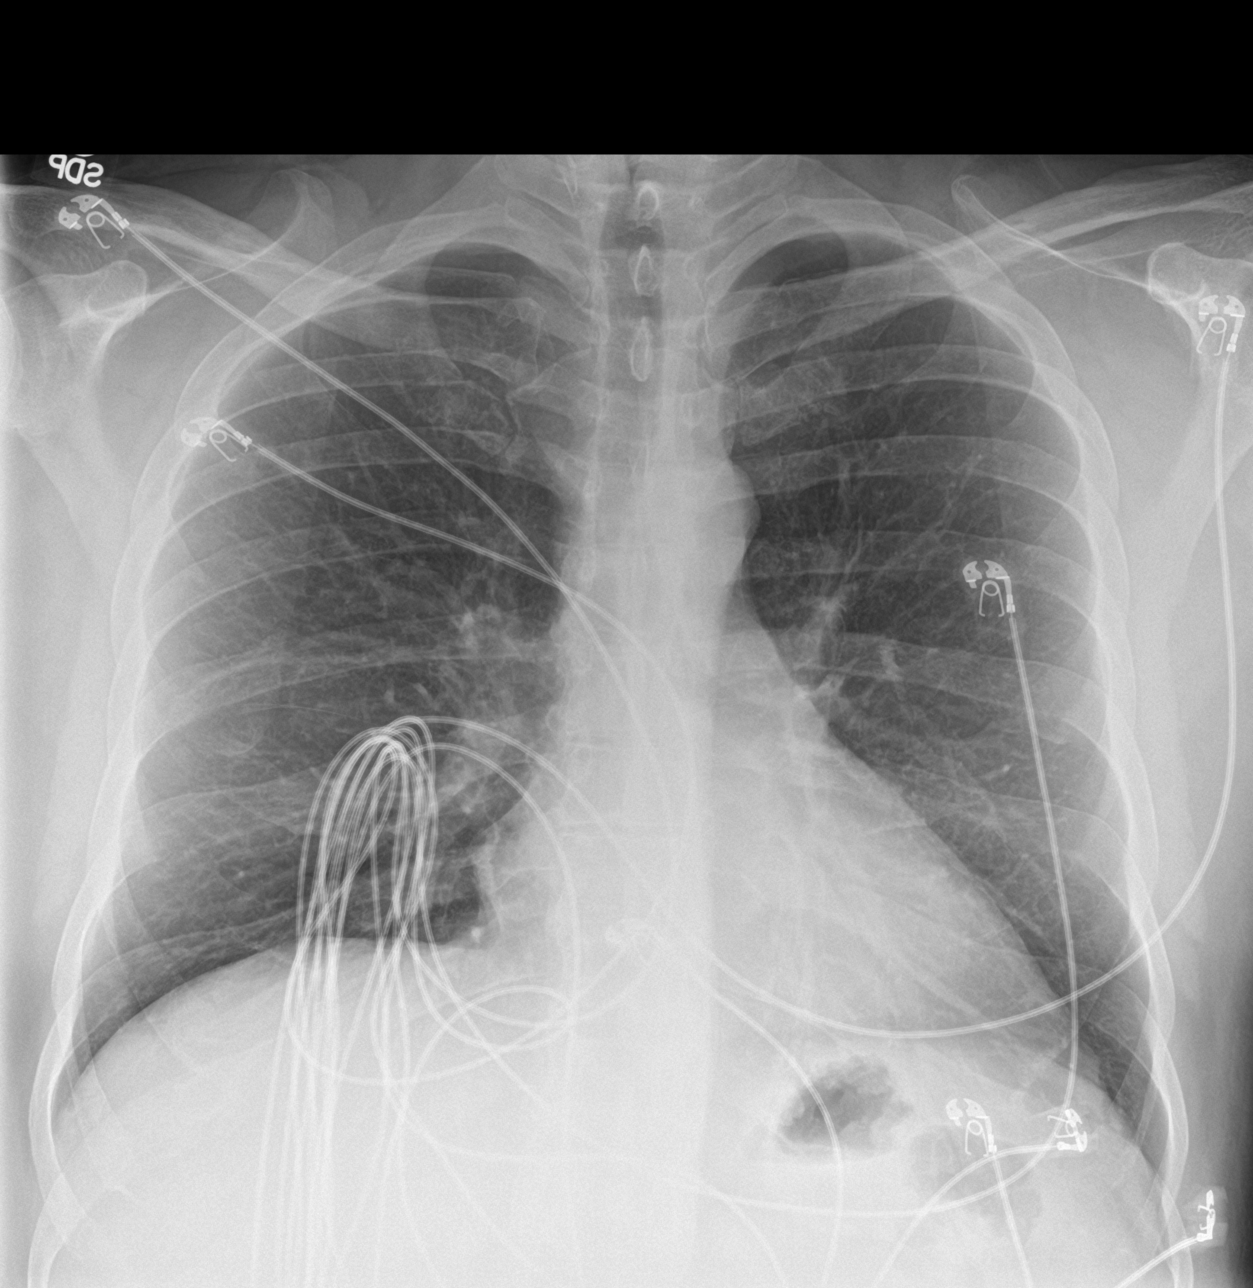

[chest lat (1 of 2)]
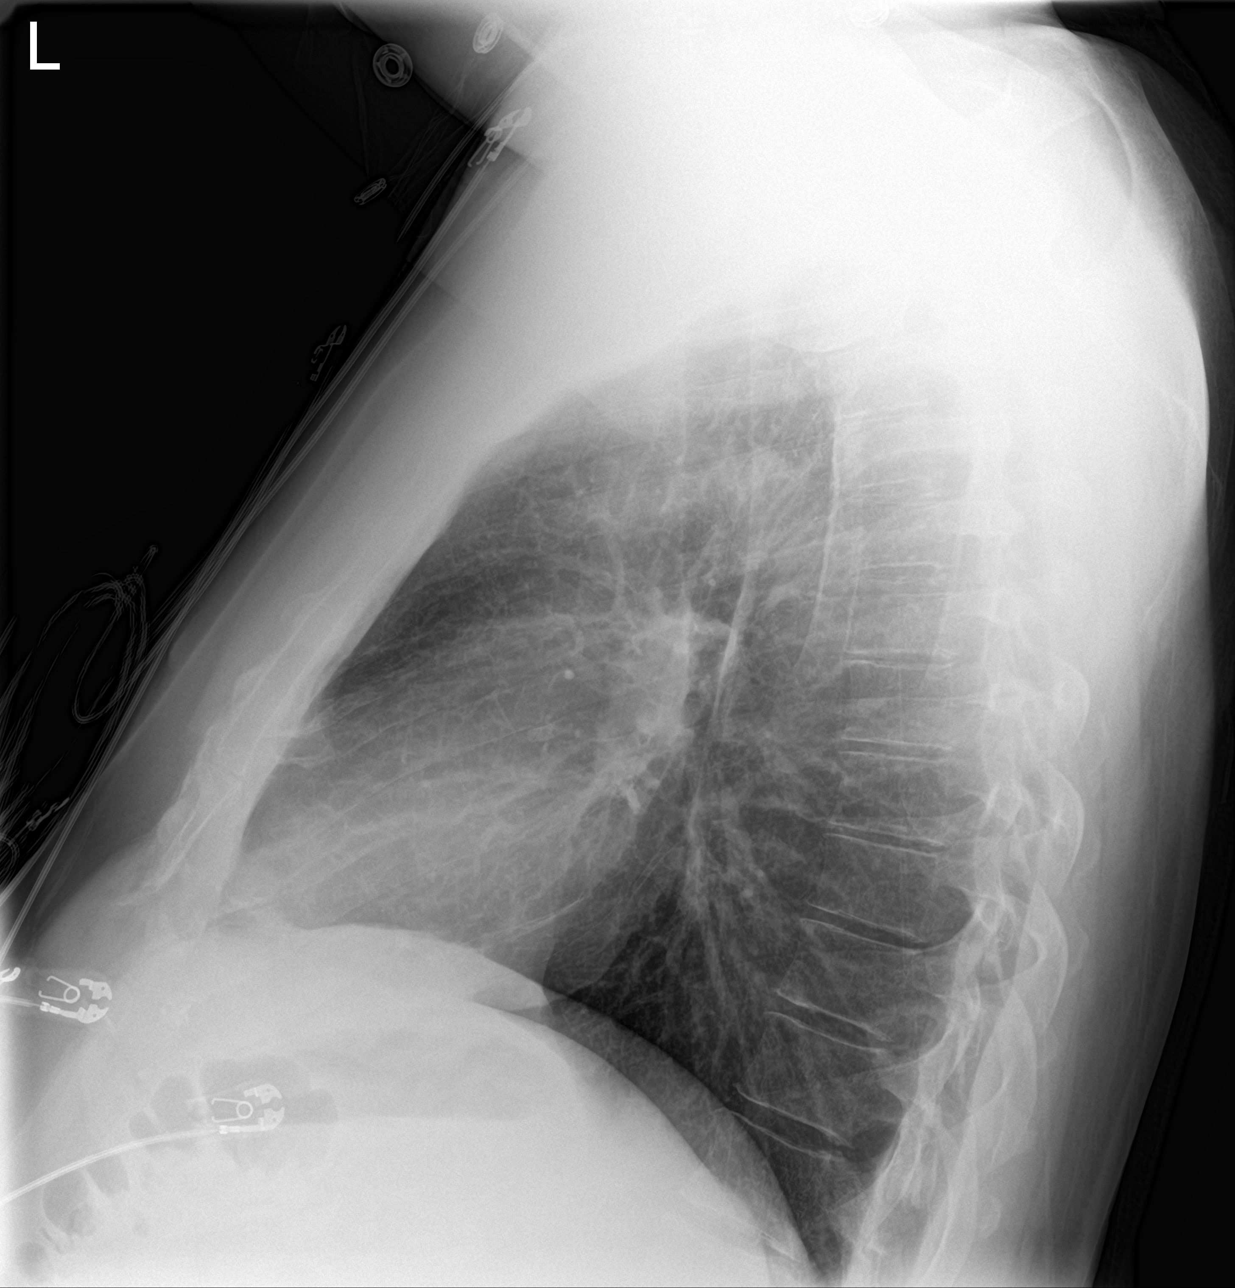

[chest lat (2 of 2)]
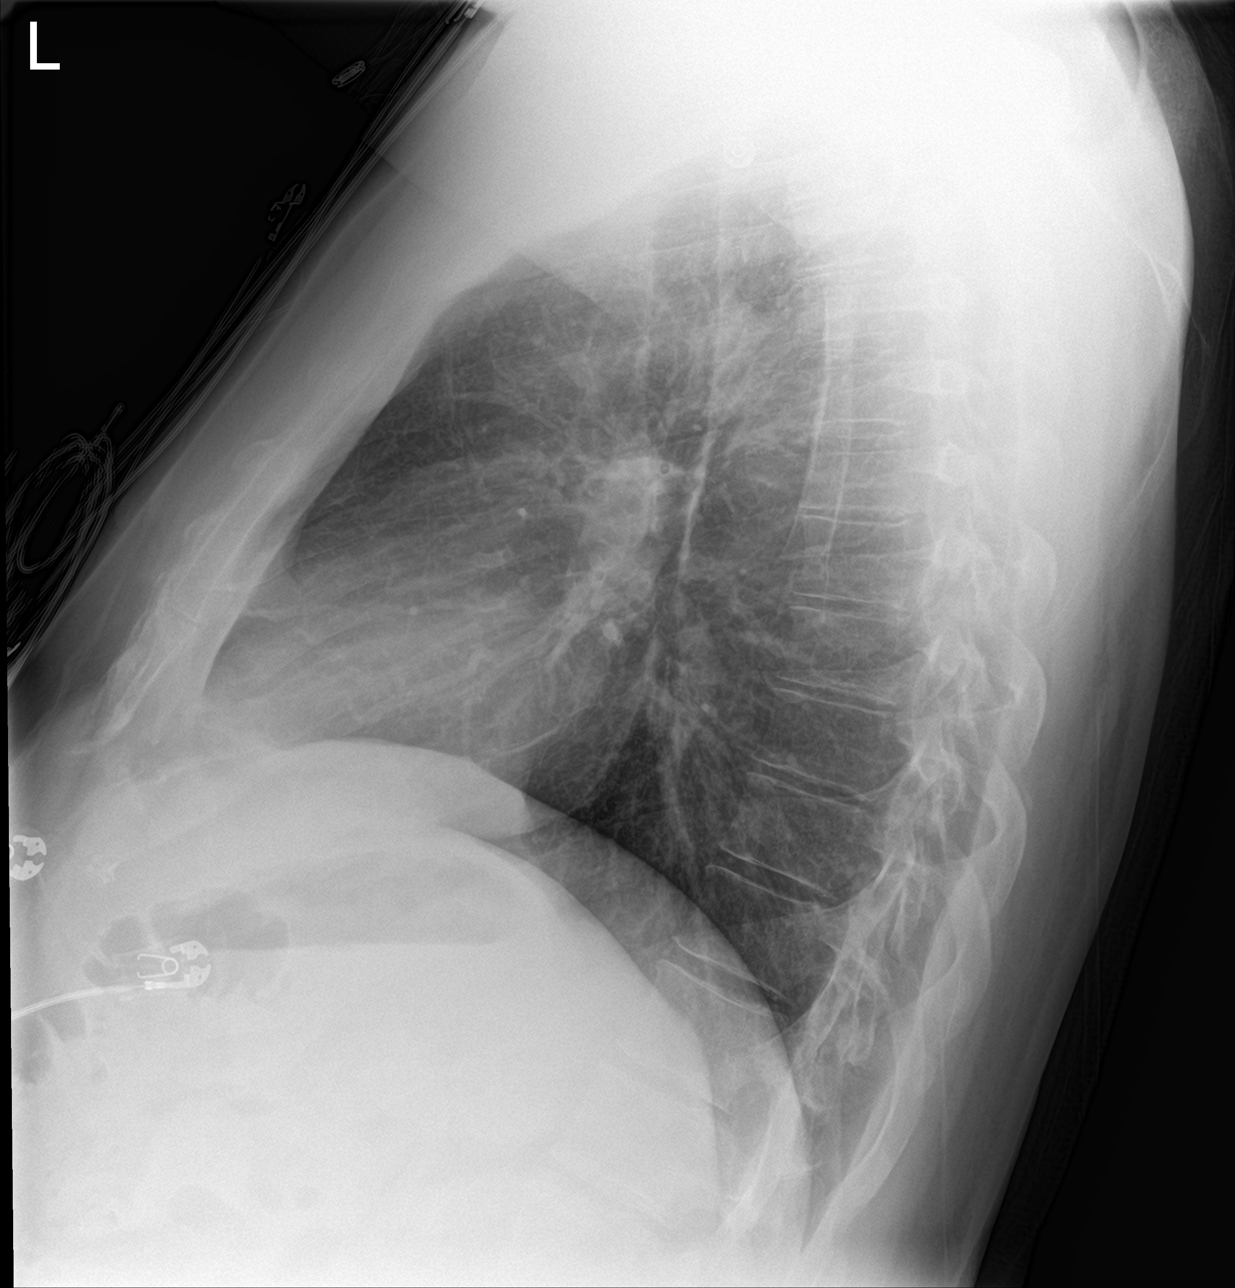

[3 of 3 positions shown; findings below may reference images not displayed]

FINDINGS: The heart size and mediastinal contours are within normal limits. No
focal airspace consolidation, pleural effusion, or pneumothorax. The
visualized skeletal structures are unremarkable.
IMPRESSION: No active cardiopulmonary disease.

## 2020-09-09 IMAGING — CT CT CHEST W/ CM
2 of 4 series · 15 of 36 positions shown, 18 images · IV contrast (omnipaque)
Comparison: Chest radiograph [DATE] and chest CT image

CLINICAL DATA: Chest pain or SOB, pleurisy or effusion suspected
Pneumonia, effusion or abscess suspected, xray done

EXAM:
CT CHEST WITH CONTRAST
TECHNIQUE: Multidetector CT imaging of the chest was performed during
intravenous contrast administration.
CONTRAST:  75mL OMNIPAQUE IOHEXOL 300 MG/ML  SOLN

[Series 3: chest w · axial · 0.86mm/px · z∈[-393,-57]mm · 12 of 200 slices shown, 15 images]
[im 16/200  mediastinal]
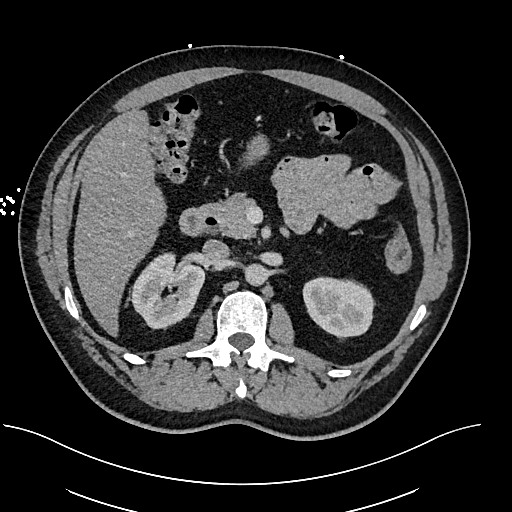
[im 16/200  lung]
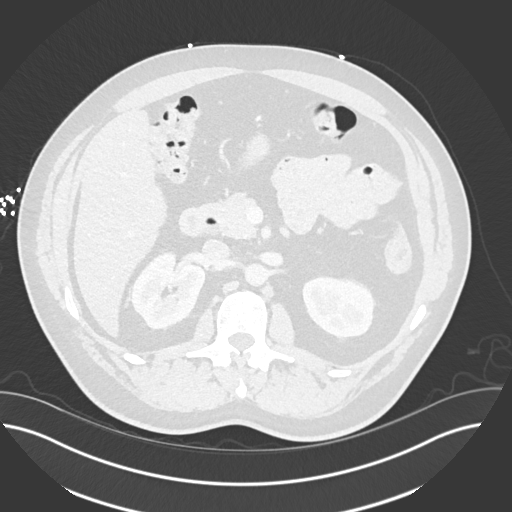
[im 31/200  lung]
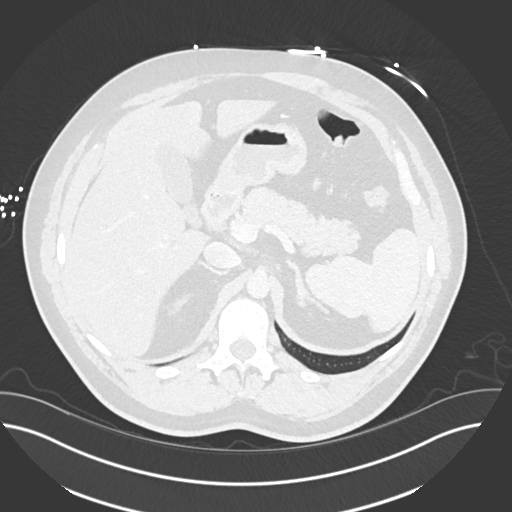
[im 46/200  lung]
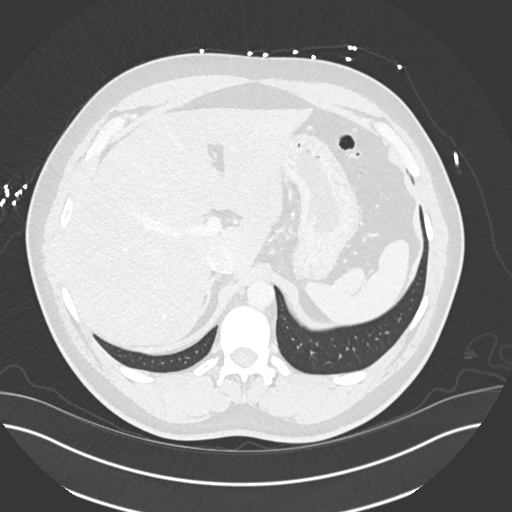
[im 62/200  lung]
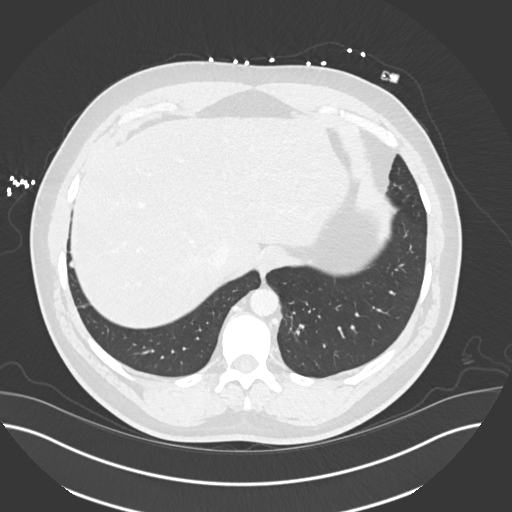
[im 77/200  mediastinal]
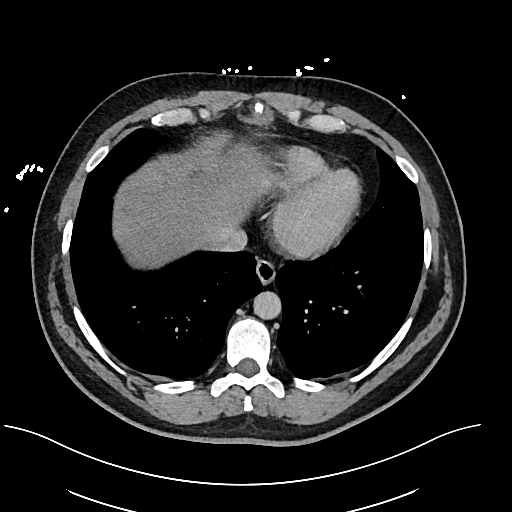
[im 77/200  lung]
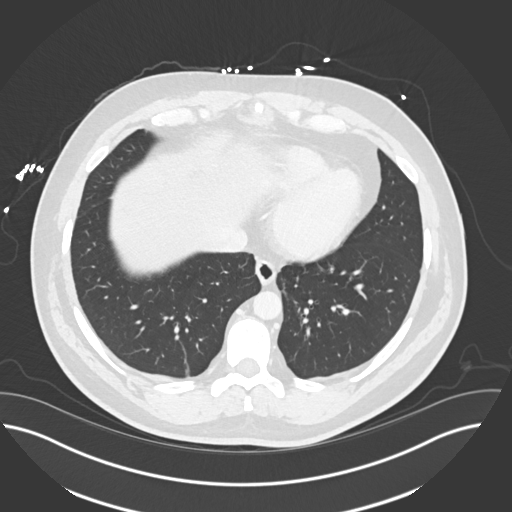
[im 92/200  lung]
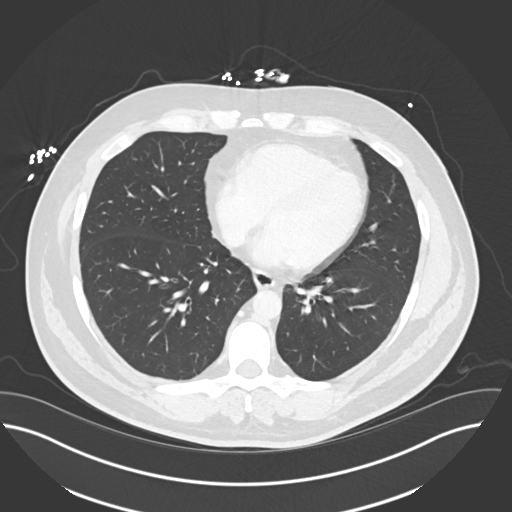
[im 108/200  lung]
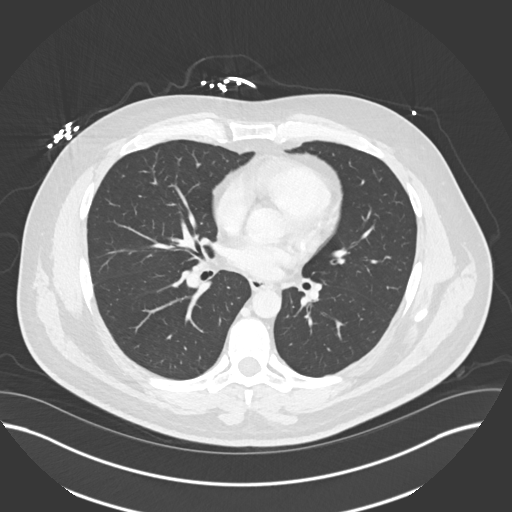
[im 123/200  lung]
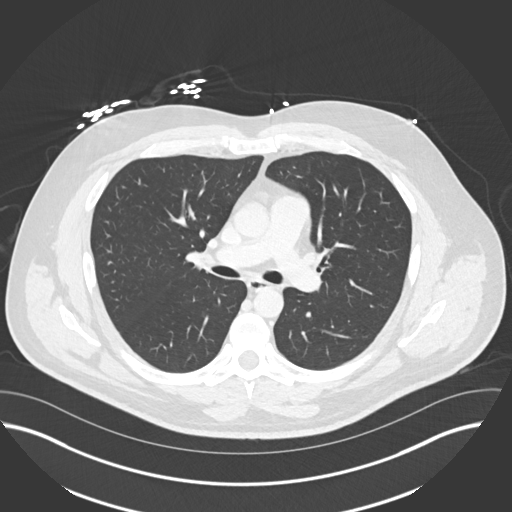
[im 138/200  mediastinal]
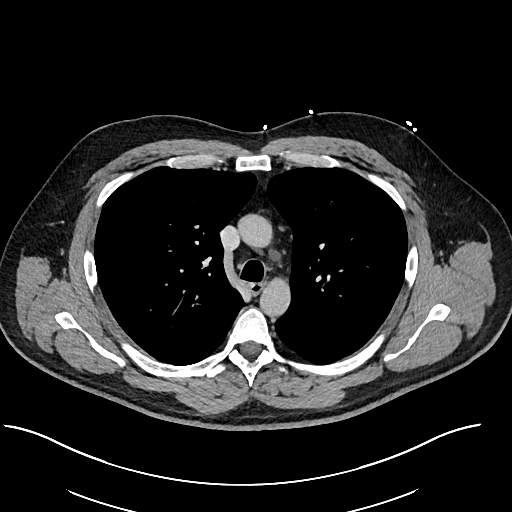
[im 138/200  lung]
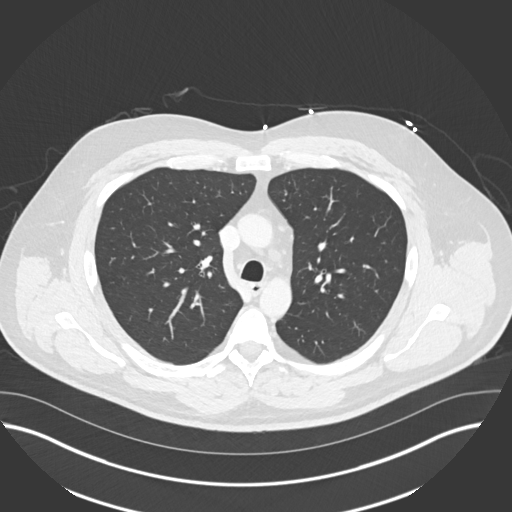
[im 154/200  lung]
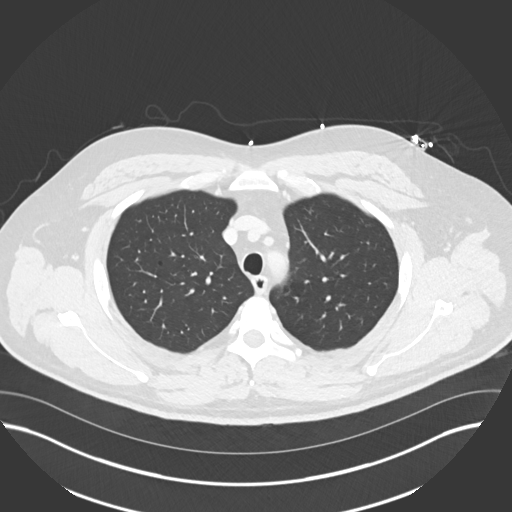
[im 169/200  lung]
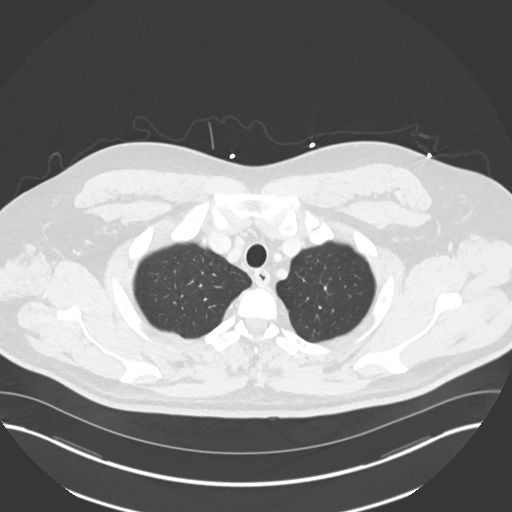
[im 184/200  lung]
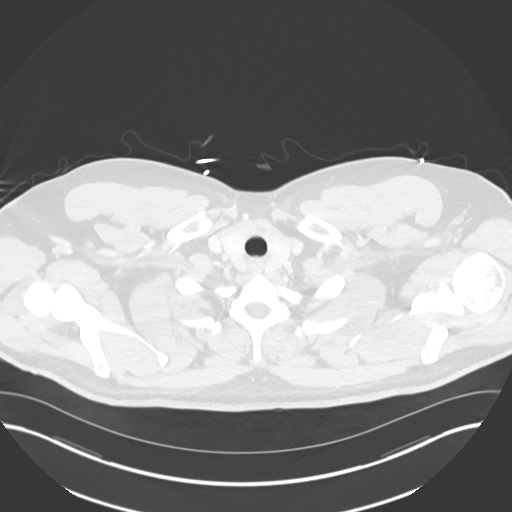

[Series 6: cor · coronal · 0.79mm/px · 3 of 174 slices shown]
[im 35/174  lung]
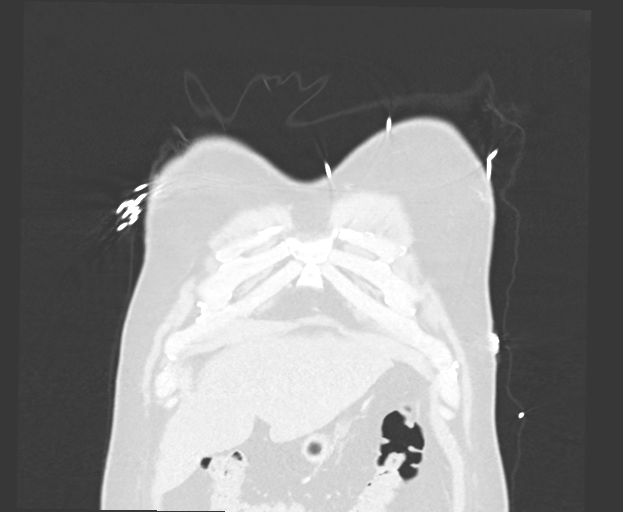
[im 70/174  lung]
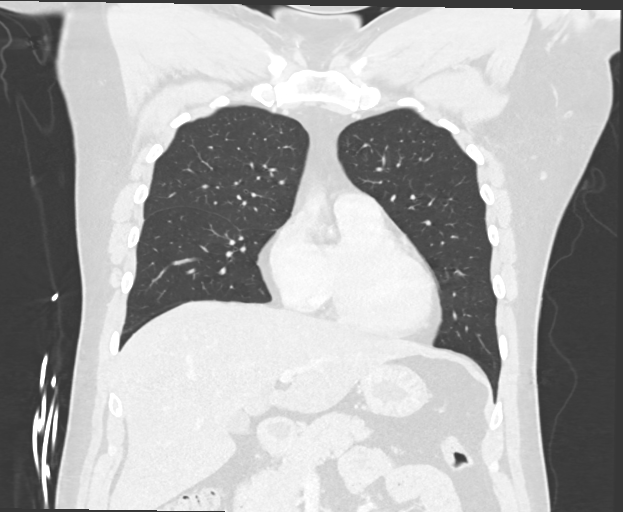
[im 104/174  lung]
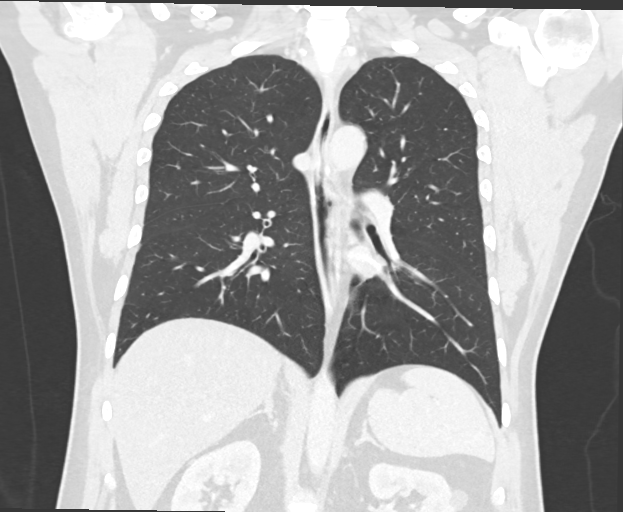

[15 of 36 positions shown; findings below may reference images not displayed]

FINDINGS: Cardiovascular: No significant vascular findings. No thoracic aortic
aneurysm. No central pulmonary embolus. Normal heart size. No
pericardial effusion. Left anterior descending coronary artery
calcifications.

Mediastinum/Nodes: No enlarged mediastinal, hilar, or axillary lymph
nodes. Thyroid gland, trachea, and esophagus demonstrate no
significant findings.

Lungs/Pleura: Right lower lobe atelectasis versus scarring. No focal
consolidation. No suspicious pulmonary nodules or masses. No pleural
effusion. No pneumothorax.

Upper Abdomen: 1.6 cm exophytic left interpolar renal cyst. Acute
abnormality.

Musculoskeletal: No chest wall abnormality. No acute or significant
osseous findings.
IMPRESSION: 1. No acute findings in the chest.
2. Left anterior descending coronary artery calcifications. Please
note that although the presence of coronary artery calcium documents
the presence of coronary artery disease, the severity of this
disease and any potential stenosis cannot be assessed on this
non-gated CT examination. Assessment for potential risk factor
modification, dietary therapy or pharmacologic therapy may be
warranted, if clinically indicated

## 2020-09-09 MED ORDER — KETOROLAC TROMETHAMINE 30 MG/ML IJ SOLN
30.0000 mg | Freq: Once | INTRAMUSCULAR | Status: AC
  Administered 2020-09-09: 30 mg via INTRAVENOUS
  Filled 2020-09-09: qty 1

## 2020-09-09 MED ORDER — IOHEXOL 300 MG/ML  SOLN
75.0000 mL | Freq: Once | INTRAMUSCULAR | Status: AC | PRN
  Administered 2020-09-09: 75 mL via INTRAVENOUS

## 2020-09-09 MED ORDER — FENTANYL CITRATE (PF) 100 MCG/2ML IJ SOLN
100.0000 ug | Freq: Once | INTRAMUSCULAR | Status: AC
  Administered 2020-09-09: 100 ug via INTRAVENOUS
  Filled 2020-09-09: qty 2

## 2020-09-09 NOTE — ED Triage Notes (Signed)
Emergency Medicine Provider Triage Evaluation Note  Matthew [name] , a 45 y.o. male  was evaluated in triage.  Pt complains of right-sided chest pain.  Patient began this morning at 0300.  Patient describes pain as crushing sensation.  No radiation.  He has been constant since then.  Patient took 1 sublingual nitro at 9:00 with no relief of his symptoms.  Patient reports associated nausea, vomiting, and diaphoresis with onset of chest pain.  He reports history of MI in January 2022.    Review of Systems  Positive: Chest pain, nausea, vomiting, Negative: Fever, chills  Physical Exam  BP (!) 150/100 (BP Location: Left Arm)   Pulse (!) 111   Temp 98.1 F (36.7 C) (Oral)   Resp 17   Ht 6\' 2"  (1.88 m)   Wt 116 kg   SpO2 94%   BMI 32.83 kg/m  Gen:   Awake, no distress   HEENT:  Atraumatic  Resp:  Normal effort, clear to auscultation bilaterally Cardiac:  Tachy at rate of 111 MSK:   Moves extremities without difficulty  Neuro:  Speech clear   Medical Decision Making  Medically screening exam initiated at 6:29 PM.  Appropriate orders placed.  Matthew [name] was informed that the remainder of the evaluation will be completed by another provider, this initial triage assessment does not replace that evaluation, and the importance of remaining in the ED until their evaluation is complete.  Clinical Impression   The patient appears stable so that the remainder of the work up may be completed by another provider.      [name], Haskel Schroeder 09/09/20 1831

## 2020-09-09 NOTE — ED Notes (Signed)
Pt transferred to CT.

## 2020-09-09 NOTE — ED Provider Notes (Signed)
[facility]   CSN: [id number] Arrival date & time: 09/09/20  1806     History No chief complaint on file.   Matthew [name] is a 45 y.o. male.  The history is provided by the patient.  Chest Pain Pain location:  R chest Pain quality: pressure   Pain radiates to:  Mid back Pain severity:  Moderate Onset quality:  Sudden Duration:  4 hours Timing:  Constant Progression:  Unchanged Chronicity:  New Context: at rest   Worsened by:  Nothing Ineffective treatments: tried doing cardiac rehab which did not make it better or worse. Associated symptoms: abdominal pain (at [employer] x3 days), anorexia, back pain, diaphoresis and nausea   Associated symptoms: no cough, no fever, no lower extremity edema, no palpitations, no shortness of breath and no vomiting   Risk factors: coronary artery disease, diabetes mellitus, high cholesterol, hypertension, male sex, obesity and smoking        Past Medical History:  Diagnosis Date  . Anxiety   . Celiac disease   . Coronary artery disease   . Dairy allergy   . Depression   . Diabetes mellitus without complication (HCC)   . High cholesterol   . Hypertension   . Paranoia (HCC)   . Pre-diabetes   . Schizoaffective disorder [facility](HCC)     Patient Active Problem List   Diagnosis Date Noted  . History of hyperprolactinemia 08/16/2020  . Mixed dyslipidemia 08/15/2020  . Chest pain 06/06/2020  . Type 2 diabetes mellitus with hyperglycemia (HCC) 06/06/2020  . Hyperkalemia 06/06/2020  . Thrombocytopenia (HCC) 06/06/2020  . Obesity (BMI 30.0-34.9) 06/06/2020  . Non-ST elevation (NSTEMI) myocardial infarction (HCC)   . Attention deficit hyperactivity disorder (ADHD), combined type, moderate 03/10/2018  . Generalized anxiety disorder 03/10/2018  . Schizoaffective disorder, bipolar type without good prognostic features (HCC) 02/09/2015  . Morbid obesity (HCC) 01/18/2015  . OSA (obstructive  sleep apnea) 07/14/2014  . Solitary pulmonary nodule 07/14/2014    Past Surgical History:  Procedure Laterality Date  . APPENDECTOMY    . BRAIN SURGERY    . CARDIAC CATHETERIZATION    . CORONARY STENT INTERVENTION N/A 06/06/2020   Procedure: CORONARY STENT INTERVENTION;  Surgeon: SwazilandJordan, Peter M, MD;  [facility];  Service: Cardiovascular;  Laterality: N/A;  prox LAD, distal RCA  . INTRAVASCULAR ULTRASOUND/IVUS N/A 06/06/2020   Procedure: Intravascular Ultrasound/IVUS;  Surgeon: [place]il[place]Jordan, Peter M, MD;  [facility];  Service: Cardiovascular;  Laterality: N/A;  . LEFT HEART CATH AND CORONARY ANGIOGRAPHY N/A 06/06/2020   Procedure: LEFT HEART CATH AND CORONARY ANGIOGRAPHY;  Surgeon: [place]ilandJordan, Peter M, MD;  [facility];  Service: Cardiovascular;  Laterality: N/A;  . NASAL SEPTUM SURGERY    . WRIST FRACTURE SURGERY     right wrist       Family History  Problem Relation Age of Onset  . Asthma Mother   . Hypertension Mother   . Hypertension Father     Social History   Tobacco Use  . Smoking status: Heavy Tobacco Smoker    Packs/day: 2.00    Types: Cigarettes  . Smokeless tobacco: Never Used  . Tobacco comment: patient given phone number to 1-800-quit now on 08/16/20  Vaping Use  . Vaping Use: Never used  Substance Use Topics  . Alcohol use: No    Alcohol/week: 0.0 standard drinks    Comment: no (02/08/15)  . Drug use: No  Home Medications Prior to Admission medications   Medication Sig Start Date End Date Taking? Authorizing Provider  aspirin EC 81 MG tablet Take 1 tablet (81 mg total) by mouth daily. Swallow whole. 06/07/20   Meriam Sprague, MD  atorvastatin (LIPITOR) 40 MG tablet Take 1 tablet (40 mg total) by mouth daily. 06/22/20 09/20/20  Meriam Sprague, MD  carvedilol (COREG) 12.5 MG tablet Take 1 tablet (12.5 mg total) by mouth 2 (two) times daily with a meal. 08/31/20   Pemberton, Kathlynn Grate, MD  clonazePAM  (KLONOPIN) 1 MG tablet Take 1 tablet (1 mg total) by mouth 3 (three) times daily as needed for anxiety. 07/28/20   Cherie Ouch, PA-C  DULoxetine (CYMBALTA) 60 MG capsule Take 1 capsule (60 mg total) by mouth 2 (two) times daily. 09/08/20   Cherie Ouch, PA-C  empagliflozin (JARDIANCE) 25 MG TABS tablet Take 1 tablet (25 mg total) by mouth daily before breakfast. 08/15/20   Shamleffer, Konrad Dolores, MD  gabapentin (NEURONTIN) 800 MG tablet TAKE 1 TABLET BY MOUTH THREE TIMES A DAY 03/29/20   Chauncey Mann, MD  insulin degludec (TRESIBA FLEXTOUCH) 100 UNIT/ML FlexTouch Pen Inject 16 Units into the skin daily. Patient not taking: Reported on 08/31/2020 08/26/20   Shamleffer, Konrad Dolores, MD  Insulin Pen Needle 32G X 4 MM MISC 1 Device by Does not apply route daily. 08/15/20   Shamleffer, Konrad Dolores, MD  isosorbide mononitrate (IMDUR) 30 MG 24 hr tablet Take 0.5 tablets (15 mg total) by mouth daily. 08/10/20   Wynetta Fines, MD  lisinopril (ZESTRIL) 20 MG tablet Take 1 tablet (20 mg total) by mouth daily. 06/08/20   Dyann Kief, PA-C  lithium carbonate (LITHOBID) 300 MG CR tablet TAKE 2 TABLETS (600 MG TOTAL) BY MOUTH AT BEDTIME. 03/29/20   Chauncey Mann, MD  metFORMIN (GLUCOPHAGE) 1000 MG tablet Take 1 tablet (1,000 mg total) by mouth 2 (two) times daily with a meal. 08/15/20   Shamleffer, Konrad Dolores, MD  nitroGLYCERIN (NITROSTAT) 0.4 MG SL tablet Place 1 tablet (0.4 mg total) under the tongue every 5 (five) minutes as needed for chest pain. 08/10/20 08/10/21  Filbert Schilder, NP  perphenazine (TRILAFON) 16 MG tablet Take 1 pill total 16 mg every morning and take 2 pills by mouth total 32 mg every bedtime 03/29/20   Chauncey Mann, MD  spironolactone (ALDACTONE) 25 MG tablet Take 0.5 tablets (12.5 mg total) by mouth daily. 07/20/20 07/15/21  Meriam Sprague, MD  ticagrelor (BRILINTA) 90 MG TABS tablet Take 1 tablet (90 mg total) by mouth 2 (two) times daily. 06/07/20    Meriam Sprague, MD  traZODone (DESYREL) 100 MG tablet TAKE 3 TABLETS BY MOUTH EVERY DAY AT BEDTIME AS NEEDED FOR SLEEP 09/08/20   Melony Overly T, PA-C    Allergies    Gluten meal  Review of Systems   Review of Systems  Constitutional: Positive for diaphoresis. Negative for chills and fever.  HENT: Negative for ear pain and sore throat.   Eyes: Negative for pain and visual disturbance.  Respiratory: Negative for cough and shortness of breath.   Cardiovascular: Positive for chest pain. Negative for palpitations.  Gastrointestinal: Positive for abdominal pain (at [employer] x3 days), anorexia and nausea. Negative for vomiting.  Genitourinary: Negative for dysuria and hematuria.  Musculoskeletal: Positive for back pain. Negative for arthralgias.  Skin: Negative for color change and rash.  Neurological: Negative for seizures and syncope.  All other  systems reviewed and are negative.   Physical Exam Updated Vital Signs BP (!) 150/100 (BP Location: Left Arm)   Pulse (!) 111   Temp 98.1 F (36.7 C) (Oral)   Resp 17   Ht 6\' 2"  (1.88 m)   Wt 116 kg   SpO2 94%   BMI 32.83 kg/m   Physical Exam Vitals and nursing note reviewed.  Constitutional:      Appearance: He is well-developed.  HENT:     Head: Normocephalic and atraumatic.  Eyes:     Conjunctiva/sclera: Conjunctivae normal.  Cardiovascular:     Rate and Rhythm: Regular rhythm. Tachycardia present.     Heart sounds: No murmur heard.   Pulmonary:     Effort: Pulmonary effort is normal. No respiratory distress.     Breath sounds: Normal breath sounds.  Abdominal:     Palpations: Abdomen is soft.     Tenderness: There is no abdominal tenderness.  Musculoskeletal:     Cervical back: Neck supple.  Skin:    General: Skin is warm and dry.  Neurological:     General: No focal deficit present.     Mental Status: He is alert.  Psychiatric:        Mood and Affect: Mood normal.     ED Results / Procedures / Treatments    Labs (all labs ordered are listed, but only abnormal results are displayed) Labs Reviewed  BASIC METABOLIC PANEL - Abnormal; Notable for the following components:      Result Value   Sodium 130 (*)    Chloride 96 (*)    CO2 20 (*)    Glucose, Bld 136 (*)    All other components within normal limits  CBC WITH DIFFERENTIAL/PLATELET - Abnormal; Notable for the following components:   WBC 19.9 (*)    RBC 6.27 (*)    Hemoglobin 17.8 (*)    HCT 52.6 (*)    Neutro Abs 15.0 (*)    Monocytes Absolute 1.1 (*)    Abs Immature Granulocytes 0.14 (*)    All other components within normal limits  D-DIMER, QUANTITATIVE  TROPONIN I (HIGH SENSITIVITY)  TROPONIN I (HIGH SENSITIVITY)    EKG None No acute ischemia Radiology DG Chest 2 View  Result Date: 09/09/2020 CLINICAL DATA:  Chest pain EXAM: CHEST - 2 VIEW COMPARISON:  08/10/2020 FINDINGS: The heart size and mediastinal contours are within normal limits. No focal airspace consolidation, pleural effusion, or pneumothorax. The visualized skeletal structures are unremarkable. IMPRESSION: No active cardiopulmonary disease. Electronically Signed   By: 08/12/2020 D.O.   On: 09/09/2020 19:23   CT Chest W Contrast  Result Date: 09/09/2020 CLINICAL DATA:  Chest pain or SOB, pleurisy or effusion suspected Pneumonia, effusion or abscess suspected, xray done EXAM: CT CHEST WITH CONTRAST TECHNIQUE: Multidetector CT imaging of the chest was performed during intravenous contrast administration. CONTRAST:  39mL OMNIPAQUE IOHEXOL 300 MG/ML  SOLN COMPARISON:  Chest radiograph August 10, 2020 and chest CT image 302018. FINDINGS: Cardiovascular: No significant vascular findings. No thoracic aortic aneurysm. No central pulmonary embolus. Normal heart size. No pericardial effusion. Left anterior descending coronary artery calcifications. Mediastinum/Nodes: No enlarged mediastinal, hilar, or axillary lymph nodes. Thyroid gland, trachea, and esophagus demonstrate  no significant findings. Lungs/Pleura: Right lower lobe atelectasis versus scarring. No focal consolidation. No suspicious pulmonary nodules or masses. No pleural effusion. No pneumothorax. Upper Abdomen: 1.6 cm exophytic left interpolar renal cyst. Acute abnormality. Musculoskeletal: No chest wall abnormality. No acute or significant  osseous findings. IMPRESSION: 1. No acute findings in the chest. 2. Left anterior descending coronary artery calcifications. Please note that although the presence of coronary artery calcium documents the presence of coronary artery disease, the severity of this disease and any potential stenosis cannot be assessed on this non-gated CT examination. Assessment for potential risk factor modification, dietary therapy or pharmacologic therapy may be warranted, if clinically indicated Electronically Signed   By: Maudry Mayhew MD   On: 09/09/2020 21:34    Procedures Procedures   Medications Ordered in ED Medications  fentaNYL (SUBLIMAZE) injection 100 mcg (100 mcg Intravenous Given 09/09/20 1936)  iohexol (OMNIPAQUE) 300 MG/ML solution 75 mL (75 mLs Intravenous Contrast Given 09/09/20 2119)  ketorolac (TORADOL) 30 MG/ML injection 30 mg (30 mg Intravenous Given 09/09/20 2308)    ED Course  I have reviewed the triage vital signs and the nursing notes.  Pertinent labs & imaging results that were available during my care of the patient were reviewed by me and considered in my medical decision making (see chart for details).    MDM Rules/Calculators/A&P                          Matthew Bern presented with right-sided chest pain.  He has a strong history of coronary artery disease, and he stated that his pain was similar to his prior coronary artery disease pain.  He was evaluated for ACS, PE, pneumonia, and I also considered referred pain from an intra-abdominal source.  While he did complain of some umbilical pain, his abdominal exam was not consistent with serious  intra-abdominal pathology.  Overall, his ED work-up was within normal limits, and I think he is safe for discharge with primary care follow-up. Final Clinical Impression(s) / ED Diagnoses Final diagnoses:  Chest pain    Rx / DC Orders ED Discharge Orders    None       Koleen Distance, MD 09/09/20 [phone]

## 2020-09-09 NOTE — ED Triage Notes (Signed)
Chest pain since 0300am today  He has taken 1 sl nitro with no releif  Rt sided chest pain  Mi in January  He has had episodes of chest pain since then  His nitro was 0900am today  He is on a blood thinner

## 2020-09-09 NOTE — ED Notes (Signed)
Pt back from CT

## 2020-09-11 ENCOUNTER — Encounter (HOSPITAL_COMMUNITY): Payer: Self-pay | Admitting: Emergency Medicine

## 2020-09-11 ENCOUNTER — Emergency Department (HOSPITAL_COMMUNITY): Payer: Medicaid Other

## 2020-09-11 ENCOUNTER — Observation Stay (HOSPITAL_COMMUNITY)
Admission: EM | Admit: 2020-09-11 | Discharge: 2020-09-12 | Disposition: A | Discharge status: Home / Self Care | Payer: Medicaid Other | Attending: Internal Medicine | Admitting: Internal Medicine

## 2020-09-11 DIAGNOSIS — R079 Chest pain, unspecified: Secondary | ICD-10-CM

## 2020-09-11 DIAGNOSIS — E119 Type 2 diabetes mellitus without complications: Secondary | ICD-10-CM | POA: Insufficient documentation

## 2020-09-11 DIAGNOSIS — Z79899 Other long term (current) drug therapy: Secondary | ICD-10-CM | POA: Diagnosis not present

## 2020-09-11 DIAGNOSIS — R1033 Periumbilical pain: Secondary | ICD-10-CM | POA: Diagnosis not present

## 2020-09-11 DIAGNOSIS — Z794 Long term (current) use of insulin: Secondary | ICD-10-CM | POA: Insufficient documentation

## 2020-09-11 DIAGNOSIS — E871 Hypo-osmolality and hyponatremia: Secondary | ICD-10-CM | POA: Diagnosis not present

## 2020-09-11 DIAGNOSIS — R0789 Other chest pain: Secondary | ICD-10-CM | POA: Diagnosis present

## 2020-09-11 DIAGNOSIS — Z7984 Long term (current) use of oral hypoglycemic drugs: Secondary | ICD-10-CM | POA: Diagnosis not present

## 2020-09-11 DIAGNOSIS — I10 Essential (primary) hypertension: Secondary | ICD-10-CM | POA: Diagnosis not present

## 2020-09-11 DIAGNOSIS — F17210 Nicotine dependence, cigarettes, uncomplicated: Secondary | ICD-10-CM | POA: Insufficient documentation

## 2020-09-11 DIAGNOSIS — I25708 Atherosclerosis of coronary artery bypass graft(s), unspecified, with other forms of angina pectoris: Secondary | ICD-10-CM

## 2020-09-11 DIAGNOSIS — I2510 Atherosclerotic heart disease of native coronary artery without angina pectoris: Secondary | ICD-10-CM | POA: Insufficient documentation

## 2020-09-11 DIAGNOSIS — D72829 Elevated white blood cell count, unspecified: Secondary | ICD-10-CM | POA: Insufficient documentation

## 2020-09-11 DIAGNOSIS — Z20822 Contact with and (suspected) exposure to covid-19: Secondary | ICD-10-CM | POA: Diagnosis not present

## 2020-09-11 DIAGNOSIS — E1165 Type 2 diabetes mellitus with hyperglycemia: Secondary | ICD-10-CM

## 2020-09-11 DIAGNOSIS — Z7982 Long term (current) use of aspirin: Secondary | ICD-10-CM | POA: Diagnosis not present

## 2020-09-11 LAB — CBC
HCT: 50.7 % (ref 39.0–52.0)
Hemoglobin: 16.8 g/dL (ref 13.0–17.0)
MCH: 28.4 pg (ref 26.0–34.0)
MCHC: 33.1 g/dL (ref 30.0–36.0)
MCV: 85.8 fL (ref 80.0–100.0)
Platelets: 307 10*3/uL (ref 150–400)
RBC: 5.91 MIL/uL — ABNORMAL HIGH (ref 4.22–5.81)
RDW: 14.4 % (ref 11.5–15.5)
WBC: 19.6 10*3/uL — ABNORMAL HIGH (ref 4.0–10.5)
nRBC: 0 % (ref 0.0–0.2)

## 2020-09-11 LAB — BASIC METABOLIC PANEL
Anion gap: 12 (ref 5–15)
BUN: 12 mg/dL (ref 6–20)
CO2: 20 mmol/L — ABNORMAL LOW (ref 22–32)
Calcium: 9.5 mg/dL (ref 8.9–10.3)
Chloride: 94 mmol/L — ABNORMAL LOW (ref 98–111)
Creatinine, Ser: 0.94 mg/dL (ref 0.61–1.24)
GFR, Estimated: >60 mL/min (ref >60)
Glucose, Bld: 199 mg/dL — ABNORMAL HIGH (ref 70–99)
Potassium: 4 mmol/L (ref 3.5–5.1)
Sodium: 126 mmol/L — ABNORMAL LOW (ref 135–145)

## 2020-09-11 LAB — HEPATIC FUNCTION PANEL
ALT: 42 U/L (ref 0–44)
AST: 29 U/L (ref 15–41)
Albumin: 4.4 g/dL (ref 3.5–5.0)
Alkaline Phosphatase: 83 U/L (ref 38–126)
Bilirubin, Direct: 0.1 mg/dL (ref 0.0–0.2)
Indirect Bilirubin: 0.9 mg/dL (ref 0.3–0.9)
Total Bilirubin: 1 mg/dL (ref 0.3–1.2)
Total Protein: 7.4 g/dL (ref 6.5–8.1)

## 2020-09-11 LAB — GLUCOSE, CAPILLARY: Glucose-Capillary: 124 mg/dL — ABNORMAL HIGH (ref 70–99)

## 2020-09-11 LAB — TROPONIN I (HIGH SENSITIVITY)
Troponin I (High Sensitivity): 3 ng/L (ref <18)
Troponin I (High Sensitivity): 4 ng/L (ref <18)

## 2020-09-11 IMAGING — CT CT ABD-PELV W/ CM
2 of 5 series · 16 of 46 positions shown, 18 images · IV contrast (OMNI)
Comparison: [DATE]

CLINICAL DATA: Umbilical pain X 3 days, chest pain, nausea,
vomiting

EXAM:
CT ABDOMEN AND PELVIS WITH CONTRAST
TECHNIQUE: Multidetector CT imaging of the abdomen and pelvis was performed
using the standard protocol following bolus administration of
intravenous contrast.
CONTRAST:  100mL OMNIPAQUE IOHEXOL 300 MG/ML  SOLN

[Series 3: abd/ pelvis 5.0 i30f 2 · axial · 0.84mm/px · z∈[-528,-98]mm · 13 of 98 slices shown, 15 images]
[im 6/98  soft-tissue]
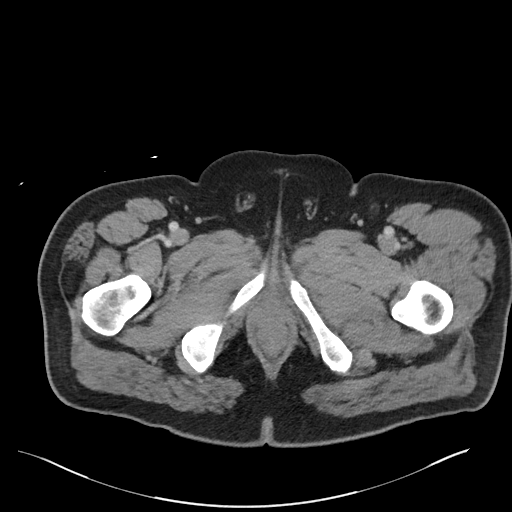
[im 6/98  bone]
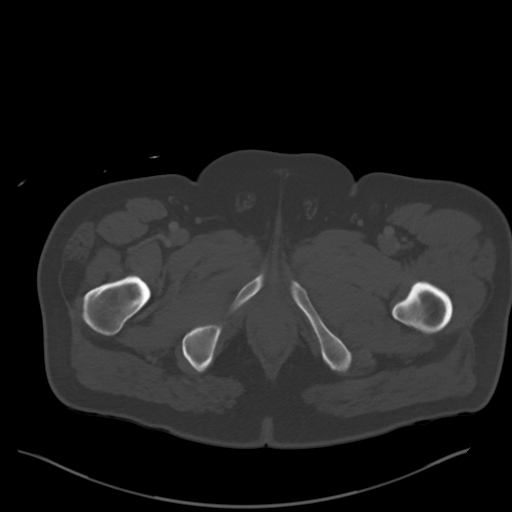
[im 16/98  soft-tissue]
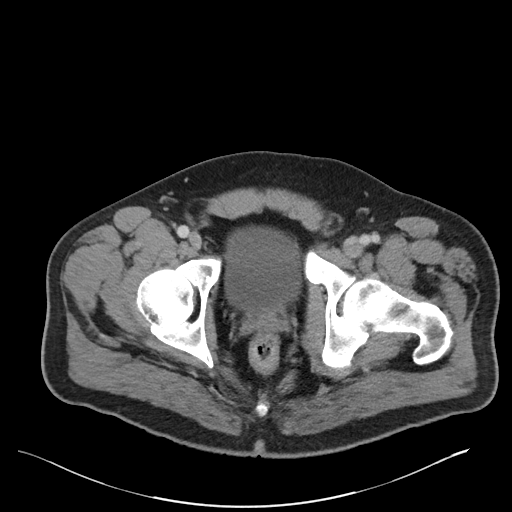
[im 21/98  soft-tissue]
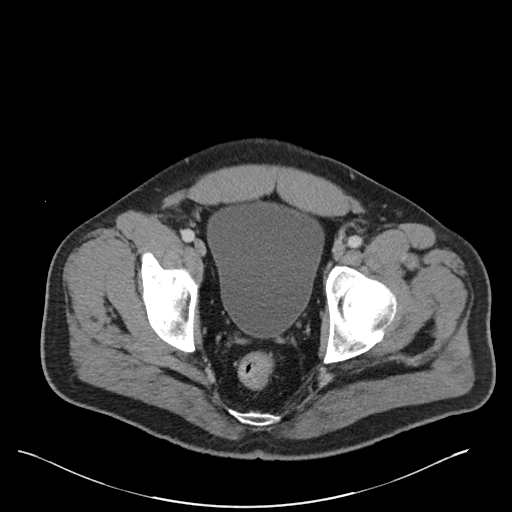
[im 26/98  soft-tissue]
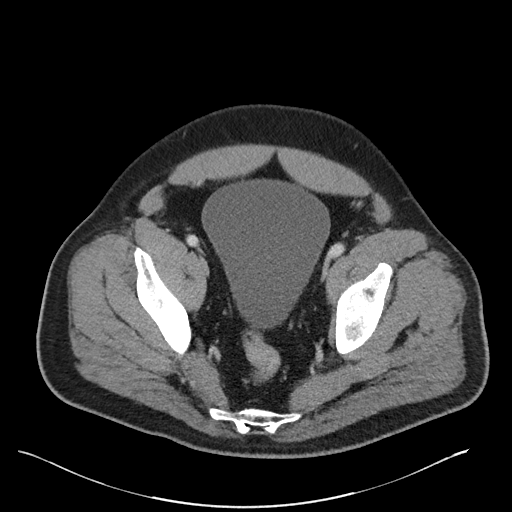
[im 36/98  soft-tissue]
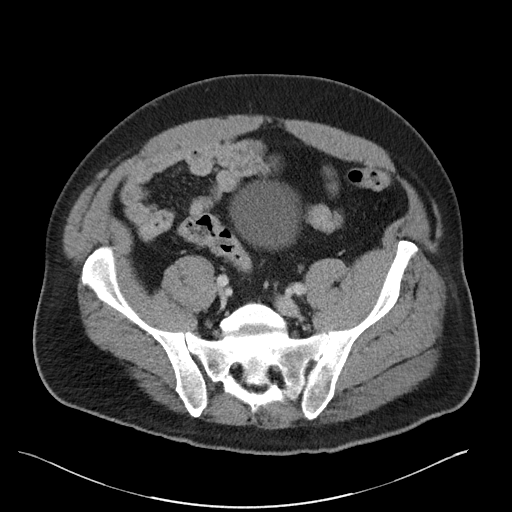
[im 41/98  soft-tissue]
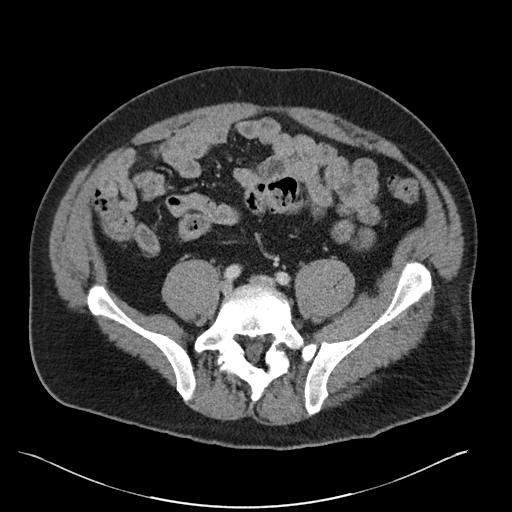
[im 52/98  soft-tissue]
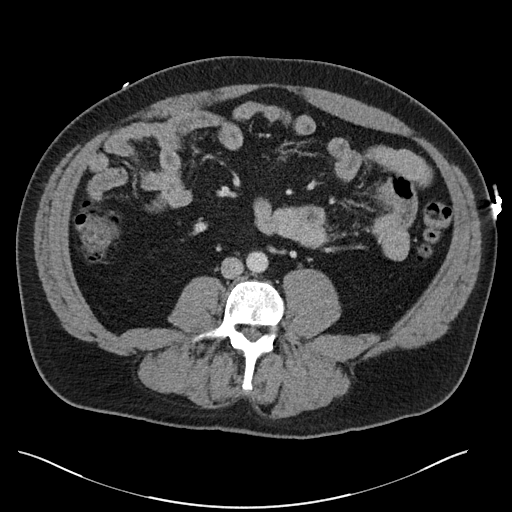
[im 57/98  soft-tissue]
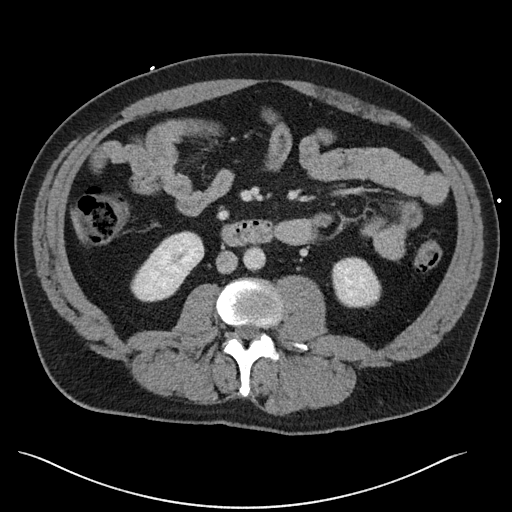
[im 62/98  soft-tissue]
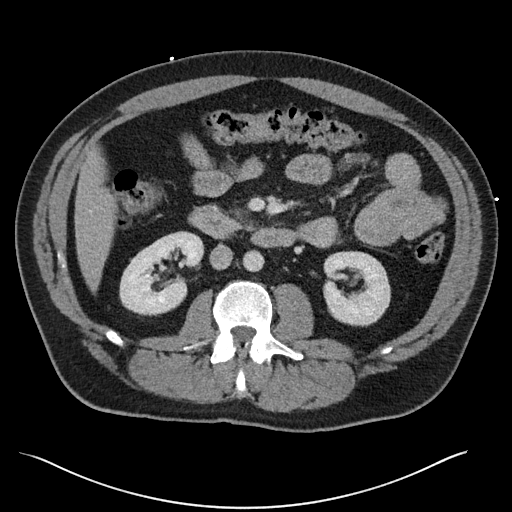
[im 62/98  bone]
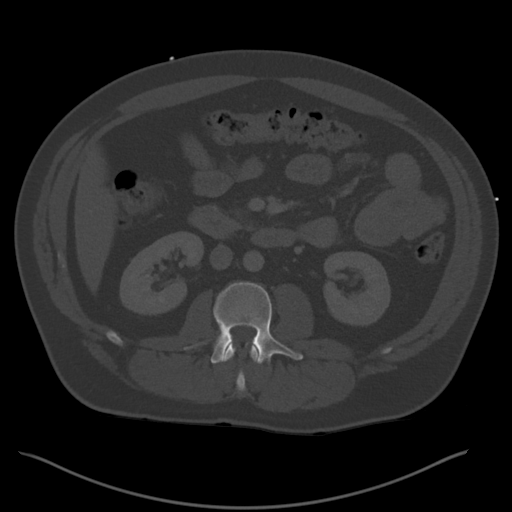
[im 72/98  soft-tissue]
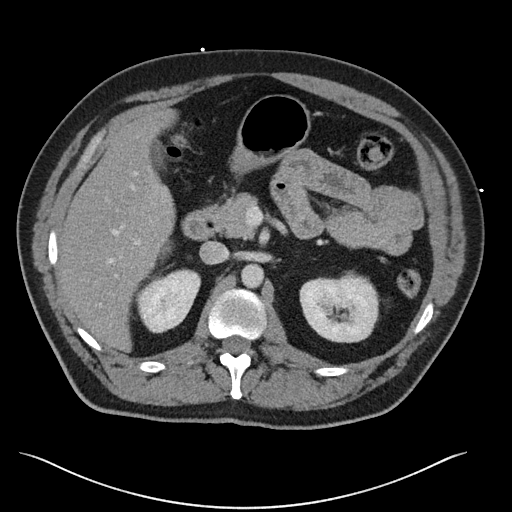
[im 77/98  soft-tissue]
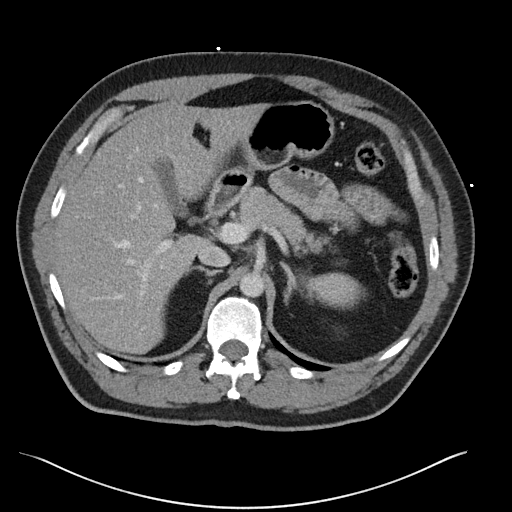
[im 82/98  soft-tissue]
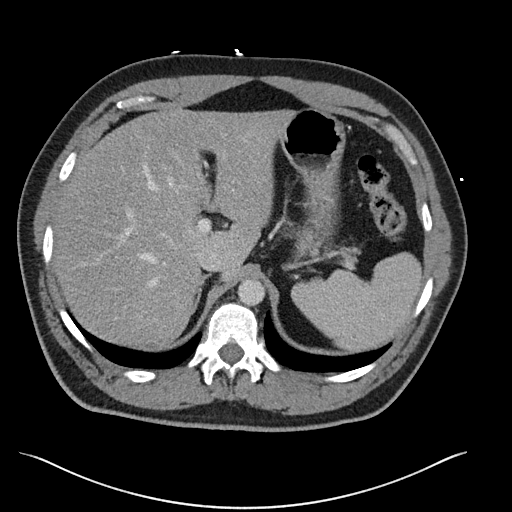
[im 92/98  soft-tissue]
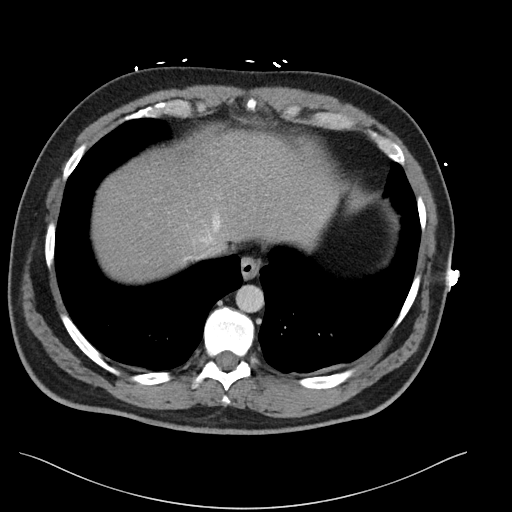

[Series 6: coronal soft tissue · coronal · 0.85mm/px · 3 of 107 slices shown]
[im 36/107  soft-tissue]
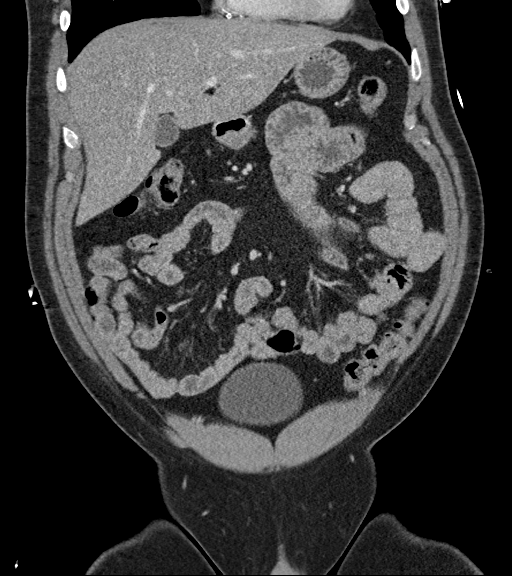
[im 48/107  soft-tissue]
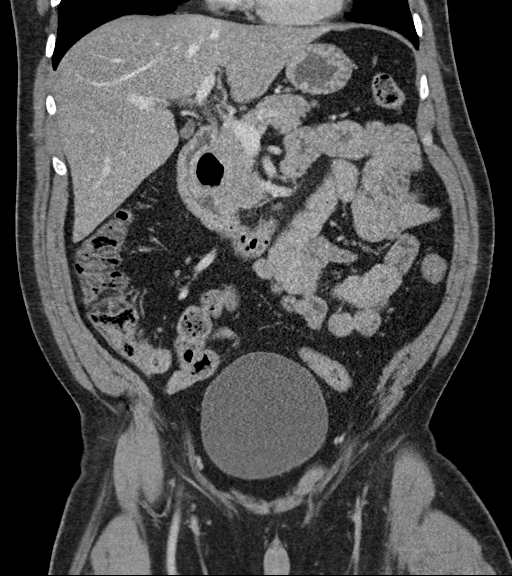
[im 59/107  soft-tissue]
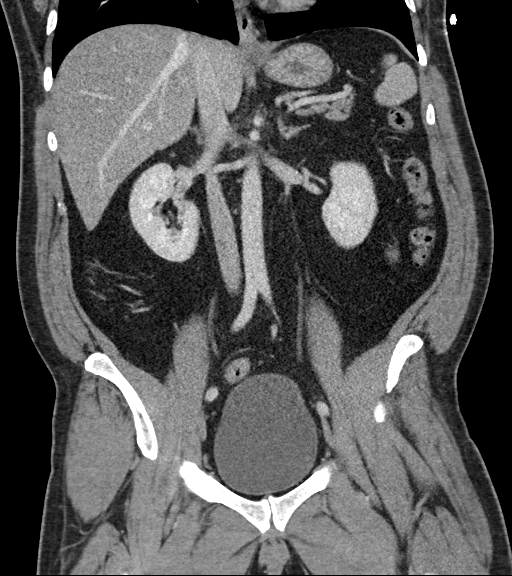

[16 of 46 positions shown; findings below may reference images not displayed]

FINDINGS: Lower chest: Coronary calcifications. No pleural or pericardial
effusion.

Hepatobiliary: Fatty liver. No focal lesion. No biliary ductal
dilatation. Gallbladder decompressed.

Pancreas: Unremarkable. No pancreatic ductal dilatation or
surrounding inflammatory changes.

Spleen: Normal in size without focal abnormality.

Adrenals/Urinary Tract: Normal adrenal glands. 1.8 cm probable cyst
from the upper pole left kidney. No urolithiasis or hydronephrosis.
Urinary bladder is physiologically distended.

Stomach/Bowel: Stomach incompletely distended. Small bowel
decompressed. Appendix surgically absent. The colon is nondilated,
unremarkable.

Vascular/Lymphatic: No significant vascular findings are present. No
enlarged abdominal or pelvic lymph nodes.

Reproductive: Mild prostate enlargement with central coarse
calcification.

Other: Bilateral pelvic phleboliths.  No ascites.  No free air.

Musculoskeletal: Chronic degenerative disc disease L3-4, L4-5.
Bilateral L5 pars defects without anterolisthesis. No acute fracture
or worrisome bone lesion.
IMPRESSION: 1. No acute findings.
2. Fatty liver.
3. Coronary calcifications. The severity of coronary artery disease
and any potential stenosis cannot be assessed on this non-gated CT
examination.

## 2020-09-11 IMAGING — CR DG CHEST 2V
2 series · 2 of 2 positions shown · non-contrast
Comparison: [DATE]

CLINICAL DATA: Chest pain.

EXAM:
CHEST - 2 VIEW

[chest pa]
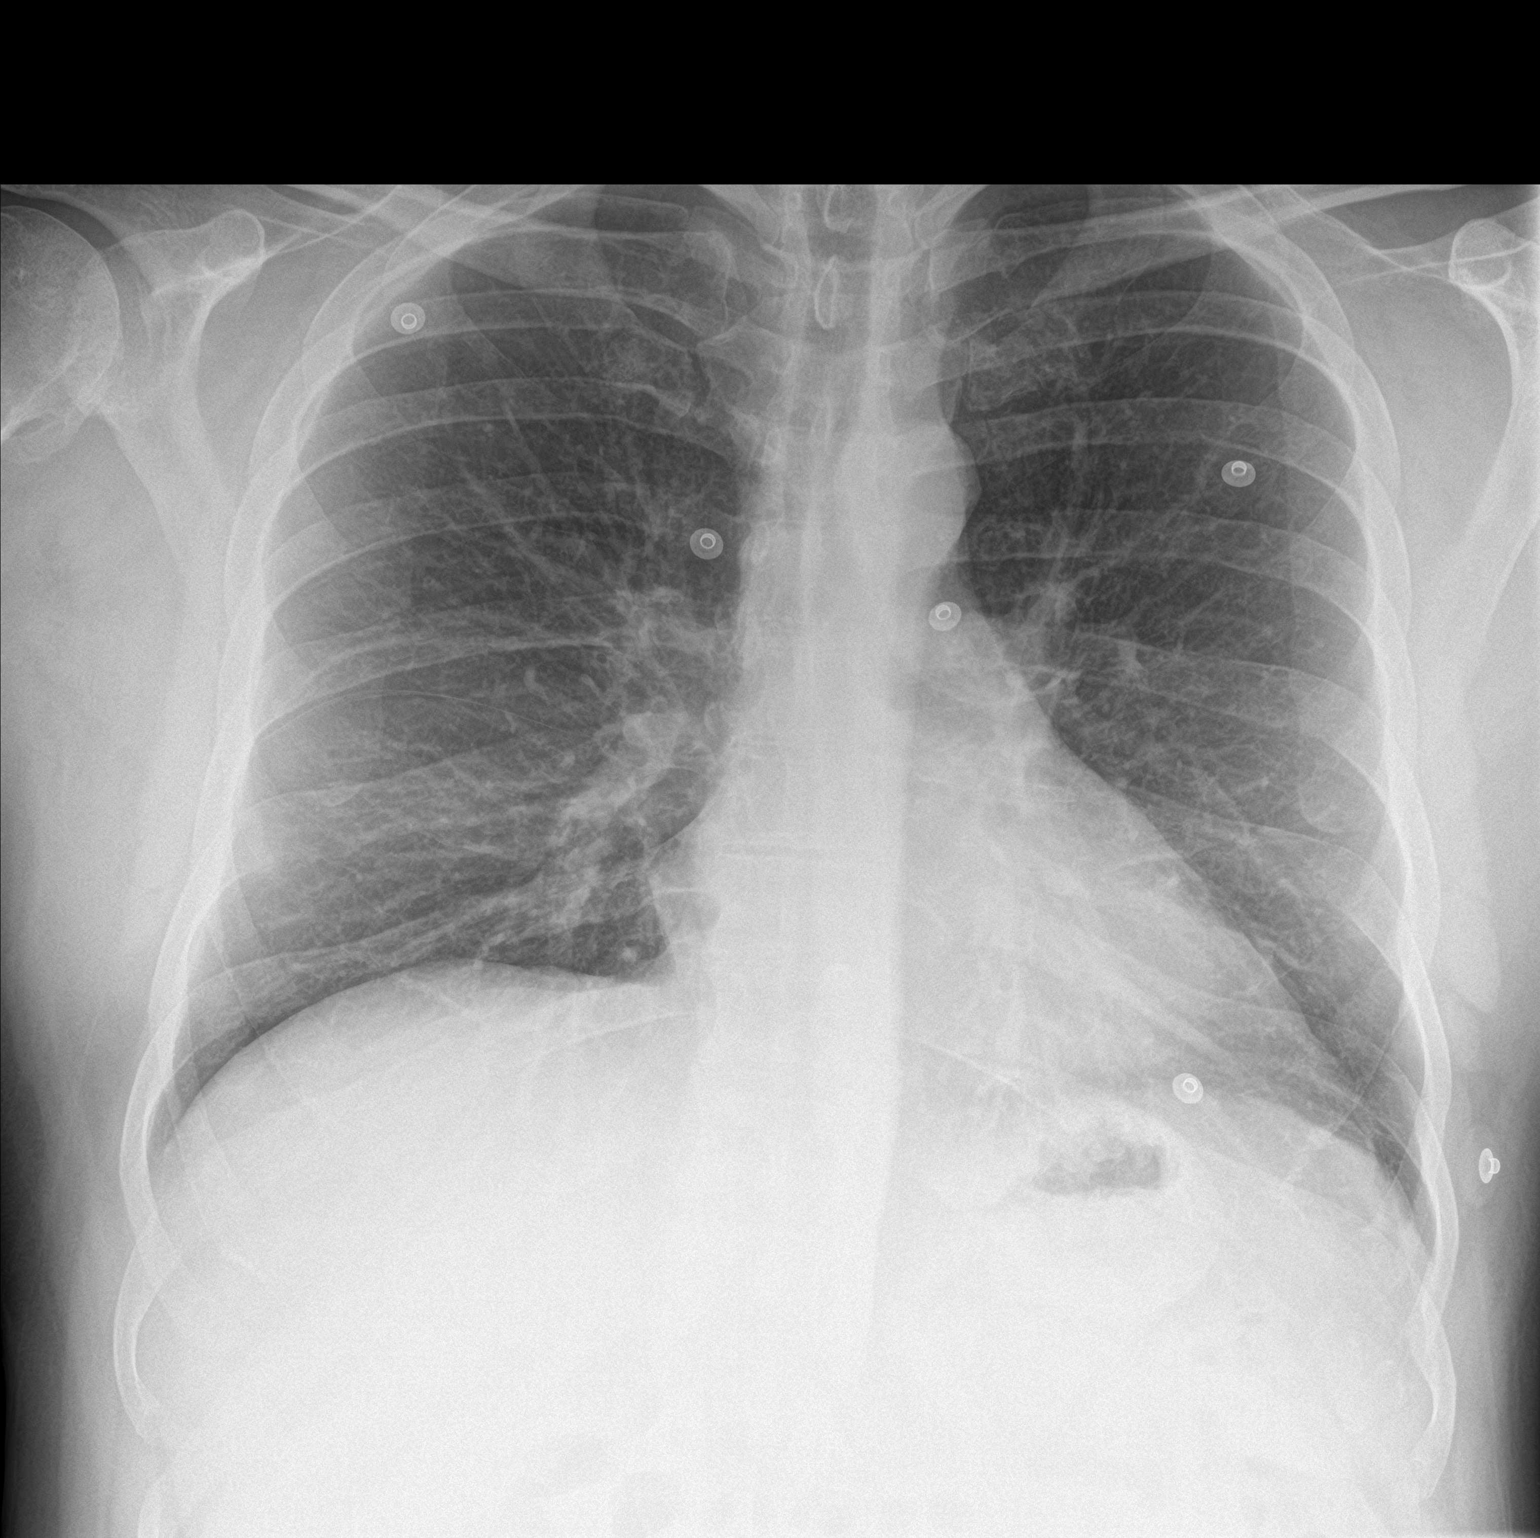

[chest lat]
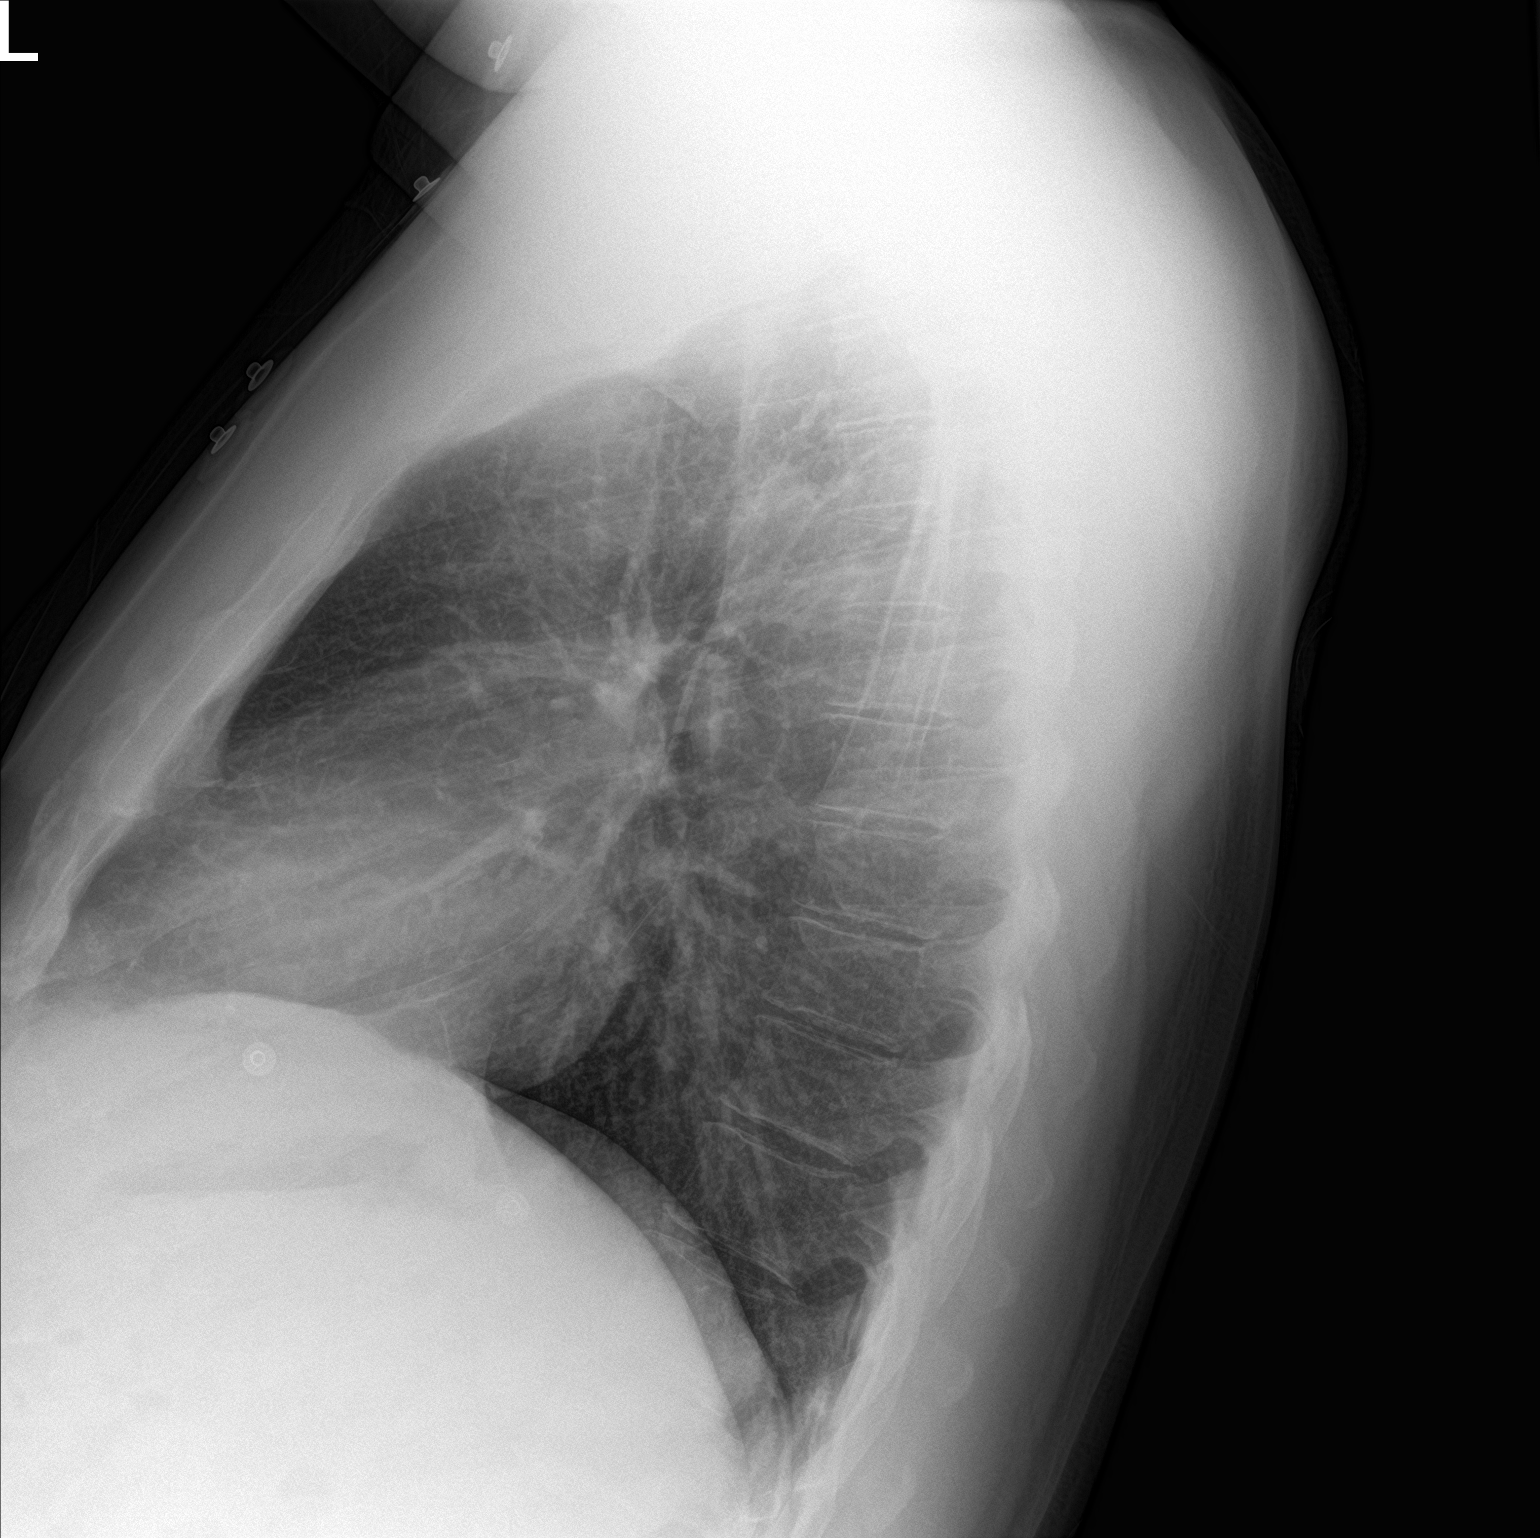

[2 of 2 positions shown; findings below may reference images not displayed]

FINDINGS: The heart size and mediastinal contours are within normal limits.
Both lungs are clear. The visualized skeletal structures are
unremarkable.
IMPRESSION: No active cardiopulmonary disease.

## 2020-09-11 MED ORDER — EZETIMIBE 10 MG PO TABS
10.0000 mg | ORAL_TABLET | Freq: Every day | ORAL | Status: DC
  Administered 2020-09-11: 10 mg via ORAL
  Filled 2020-09-11 (×2): qty 1

## 2020-09-11 MED ORDER — TRAZODONE HCL 50 MG PO TABS: 300.0000 mg | ORAL_TABLET | Freq: Every evening | ORAL | Status: DC | PRN

## 2020-09-11 MED ORDER — PERPHENAZINE 4 MG PO TABS
32.0000 mg | ORAL_TABLET | Freq: Every evening | ORAL | Status: DC
  Filled 2020-09-11: qty 8

## 2020-09-11 MED ORDER — INSULIN GLARGINE 100 UNIT/ML ~~LOC~~ SOLN
10.0000 [IU] | Freq: Every day | SUBCUTANEOUS | Status: DC
  Filled 2020-09-11: qty 0.1

## 2020-09-11 MED ORDER — ATORVASTATIN CALCIUM 10 MG PO TABS: 40.0000 mg | ORAL_TABLET | Freq: Every day | ORAL | Status: DC

## 2020-09-11 MED ORDER — CARVEDILOL 3.125 MG PO TABS: 12.5000 mg | ORAL_TABLET | Freq: Two times a day (BID) | ORAL | Status: DC

## 2020-09-11 MED ORDER — ASPIRIN EC 81 MG PO TBEC: 81.0000 mg | DELAYED_RELEASE_TABLET | Freq: Every day | ORAL | Status: DC

## 2020-09-11 MED ORDER — ATORVASTATIN CALCIUM 80 MG PO TABS: 80.0000 mg | ORAL_TABLET | Freq: Every day | ORAL | Status: DC

## 2020-09-11 MED ORDER — CLONAZEPAM 0.5 MG PO TABS
1.0000 mg | ORAL_TABLET | Freq: Three times a day (TID) | ORAL | Status: DC | PRN
  Administered 2020-09-11 – 2020-09-12 (×2): 1 mg via ORAL
  Filled 2020-09-11 (×2): qty 2

## 2020-09-11 MED ORDER — LITHIUM CARBONATE ER 300 MG PO TBCR
600.0000 mg | EXTENDED_RELEASE_TABLET | Freq: Every day | ORAL | Status: DC
  Administered 2020-09-11: 600 mg via ORAL
  Filled 2020-09-11: qty 2

## 2020-09-11 MED ORDER — DULOXETINE HCL 60 MG PO CPEP
60.0000 mg | ORAL_CAPSULE | Freq: Two times a day (BID) | ORAL | Status: DC
  Administered 2020-09-11: 60 mg via ORAL
  Filled 2020-09-11: qty 1

## 2020-09-11 MED ORDER — PERPHENAZINE 4 MG PO TABS
16.0000 mg | ORAL_TABLET | Freq: Every day | ORAL | Status: DC
  Filled 2020-09-11: qty 4

## 2020-09-11 MED ORDER — POLYETHYLENE GLYCOL 3350 17 G PO PACK: 17.0000 g | PACK | Freq: Every day | ORAL | Status: DC | PRN

## 2020-09-11 MED ORDER — TICAGRELOR 90 MG PO TABS
90.0000 mg | ORAL_TABLET | Freq: Two times a day (BID) | ORAL | Status: DC
  Administered 2020-09-11: 90 mg via ORAL
  Filled 2020-09-11: qty 1

## 2020-09-11 MED ORDER — ACETAMINOPHEN 325 MG PO TABS: 650.0000 mg | ORAL_TABLET | Freq: Four times a day (QID) | ORAL | Status: DC | PRN

## 2020-09-11 MED ORDER — ONDANSETRON HCL 4 MG/2ML IJ SOLN
4.0000 mg | Freq: Once | INTRAMUSCULAR | Status: AC
  Administered 2020-09-11: 4 mg via INTRAVENOUS
  Filled 2020-09-11: qty 2

## 2020-09-11 MED ORDER — LISINOPRIL 20 MG PO TABS: 20.0000 mg | ORAL_TABLET | Freq: Every day | ORAL | Status: DC

## 2020-09-11 MED ORDER — ISOSORBIDE MONONITRATE ER 30 MG PO TB24: 15.0000 mg | ORAL_TABLET | Freq: Every day | ORAL | Status: DC

## 2020-09-11 MED ORDER — MORPHINE SULFATE (PF) 4 MG/ML IV SOLN
4.0000 mg | Freq: Once | INTRAVENOUS | Status: AC
  Administered 2020-09-11: 4 mg via INTRAVENOUS
  Filled 2020-09-11: qty 1

## 2020-09-11 MED ORDER — CLONAZEPAM 0.5 MG PO TABS
1.0000 mg | ORAL_TABLET | Freq: Once | ORAL | Status: AC
  Administered 2020-09-11: 1 mg via ORAL
  Filled 2020-09-11: qty 2

## 2020-09-11 MED ORDER — NITROGLYCERIN 0.4 MG SL SUBL
0.4000 mg | SUBLINGUAL_TABLET | SUBLINGUAL | Status: DC | PRN
  Administered 2020-09-11: 0.4 mg via SUBLINGUAL
  Filled 2020-09-11: qty 1

## 2020-09-11 MED ORDER — KETOROLAC TROMETHAMINE 30 MG/ML IJ SOLN
30.0000 mg | Freq: Once | INTRAMUSCULAR | Status: AC
  Administered 2020-09-11: 30 mg via INTRAVENOUS
  Filled 2020-09-11: qty 1

## 2020-09-11 MED ORDER — IOHEXOL 300 MG/ML  SOLN
100.0000 mL | Freq: Once | INTRAMUSCULAR | Status: AC | PRN
  Administered 2020-09-11: 100 mL via INTRAVENOUS

## 2020-09-11 MED ORDER — PERPHENAZINE 16 MG PO TABS
32.0000 mg | ORAL_TABLET | Freq: Every evening | ORAL | Status: DC
  Administered 2020-09-11: 32 mg via ORAL
  Filled 2020-09-11: qty 2

## 2020-09-11 MED ORDER — ACETAMINOPHEN 650 MG RE SUPP: 650.0000 mg | Freq: Four times a day (QID) | RECTAL | Status: DC | PRN

## 2020-09-11 MED ORDER — PERPHENAZINE 16 MG PO TABS
16.0000 mg | ORAL_TABLET | Freq: Every morning | ORAL | Status: DC
  Filled 2020-09-11: qty 1

## 2020-09-11 MED ORDER — GABAPENTIN 400 MG PO CAPS
800.0000 mg | ORAL_CAPSULE | Freq: Three times a day (TID) | ORAL | Status: DC
  Administered 2020-09-11: 800 mg via ORAL
  Filled 2020-09-11: qty 2

## 2020-09-11 MED ORDER — INSULIN ASPART 100 UNIT/ML ~~LOC~~ SOLN: 0.0000 [IU] | Freq: Three times a day (TID) | SUBCUTANEOUS | Status: DC

## 2020-09-11 MED ORDER — INSULIN GLARGINE 100 UNIT/ML ~~LOC~~ SOLN
10.0000 [IU] | Freq: Every day | SUBCUTANEOUS | Status: DC
  Filled 2020-09-11 (×2): qty 0.1

## 2020-09-11 MED ORDER — SODIUM CHLORIDE 0.9% FLUSH
3.0000 mL | Freq: Two times a day (BID) | INTRAVENOUS | Status: DC
  Administered 2020-09-11: 3 mL via INTRAVENOUS

## 2020-09-11 MED ORDER — SODIUM CHLORIDE 0.9 % IV BOLUS
1000.0000 mL | Freq: Once | INTRAVENOUS | Status: AC
  Administered 2020-09-11: 1000 mL via INTRAVENOUS

## 2020-09-11 MED ORDER — GABAPENTIN 800 MG PO TABS
800.0000 mg | ORAL_TABLET | Freq: Three times a day (TID) | ORAL | Status: DC
  Filled 2020-09-11: qty 1

## 2020-09-11 NOTE — ED Notes (Signed)
Pt states ntg tablet did not help, but it gave him a HA and refuses another NTG

## 2020-09-11 NOTE — ED Notes (Signed)
L Hung in lab will add hepatic panel

## 2020-09-11 NOTE — Progress Notes (Deleted)
Date: 09/11/2020               Patient Name:  Matthew [name] MRN: [id number]  DOB: Jan 15, 1976 Age / Sex: 45 y.o., male   PCP: [name], MD         Medical Service: Internal Medicine Teaching Service         Attending Physician: Dr. Rozelle Logan, DO    First Contact: Dr. Imogene Burn Pager: [phone]  Second Contact: Dr. Huel Cote Pager: [phone]       After Hours (After 5p/  First Contact Pager: [phone]  weekends / holidays): Second Contact Pager: [phone]   Chief Complaint: chest pain, stomach pain, N/V  History of Present Illness:   Matthew [name] is a 45 y/o male with a PMHx of HTN, HLD, T2DM, CAD with NSTEMI s/p DES (05/2020), celiac disease, and schizoaffective disorder with bipolar disorder who presents to the ED with c/o right-sided chest pain, periumbilical pain, and nausea/vomiting.   Chest pain has been ongoing for 3 days, but states that he has had chest pain since January when he had his NSTEMI. It is right sided and shoots to his back. Pain is occasionally sharp, but otherwise dull and persistent. He does note light worsening with exercise, but no worsening with positional changes. He follows Dr. Shari Prows in outpatient setting and states that addition of coreg, statin, imdur, and lisinopril has somewhat helped his chest pain but not fully relieved it. He does endorse some cold sweats, but otherwise denies fevers, chills, palpitations, SHOB, orthopnea.  He is a current daily smoker, trying to quit. 2 ppd x 10 years.   He also reports belly button pain, nausea, and NBNB vomiting as well, but these symptoms have also been chronic and ongoing for months. There is no clear association between his chest pain and these symptoms. He denies any constipation, diarrhea, urinary frequency, hematuria, dysuria, hematemesis, hematochezia, melena.   No family history of cancer, including leukemia or lymphoma. No recent infections or possible exposure to STI.   Of note, patient  confirms a hx of Celiac disease but states he does not have significant symptoms if he eats only small amounts of bread. He reports that he did have rice last night and had a biscuit this morning.    ED Course:  CBC with leukocytosis (19.6, chronic but worsened). BMP showing sodium 126 (chronic but worsened), bicarb 20, glucose 199, anion gap 12. Hepatic function panel wnl. Troponins negative. EKG with no acute changes. CT abd/pel showing no acute intraabdominal findings. Cardiology was consulted, believe it is atypical chest pain and in setting of recent stent placement, asked to admit for observation. IMTS asked to admit.  Meds:  No current facility-administered medications on file prior to encounter.   Current Outpatient Medications on File Prior to Encounter  Medication Sig Dispense Refill  . aspirin EC 81 MG tablet Take 1 tablet (81 mg total) by mouth daily. Swallow whole. 90 tablet 3  . atenolol (TENORMIN) 100 MG tablet Take 100 mg by mouth every morning.    Marland Kitchen atorvastatin (LIPITOR) 40 MG tablet Take 1 tablet (40 mg total) by mouth daily. 90 tablet 3  . carvedilol (COREG) 12.5 MG tablet Take 1 tablet (12.5 mg total) by mouth 2 (two) times daily with a meal. 180 tablet 1  . clonazePAM (KLONOPIN) 1 MG tablet Take 1 tablet (1 mg total) by mouth 3 (three) times daily as needed for anxiety. 90 tablet 1  . DULoxetine (  CYMBALTA) 60 MG capsule Take 1 capsule (60 mg total) by mouth 2 (two) times daily. 180 capsule 0  . empagliflozin (JARDIANCE) 25 MG TABS tablet Take 1 tablet (25 mg total) by mouth daily before breakfast. 90 tablet 1  . gabapentin (NEURONTIN) 800 MG tablet TAKE 1 TABLET BY MOUTH THREE TIMES A DAY 270 tablet 1  . insulin degludec (TRESIBA FLEXTOUCH) 100 UNIT/ML FlexTouch Pen Inject 16 Units into the skin daily. (Patient not taking: Reported on 08/31/2020) 15 mL 11  . Insulin Pen Needle 32G X 4 MM MISC 1 Device by Does not apply route daily. 150 each 3  . isosorbide mononitrate (IMDUR)  30 MG 24 hr tablet Take 0.5 tablets (15 mg total) by mouth daily. 30 tablet 0  . lisinopril (ZESTRIL) 20 MG tablet Take 1 tablet (20 mg total) by mouth daily. 90 tablet 3  . lithium carbonate (LITHOBID) 300 MG CR tablet TAKE 2 TABLETS (600 MG TOTAL) BY MOUTH AT BEDTIME. 180 tablet 1  . metFORMIN (GLUCOPHAGE) 1000 MG tablet Take 1 tablet (1,000 mg total) by mouth 2 (two) times daily with a meal. 180 tablet 3  . nitroGLYCERIN (NITROSTAT) 0.4 MG SL tablet Place 1 tablet (0.4 mg total) under the tongue every 5 (five) minutes as needed for chest pain. 100 tablet 3  . perphenazine (TRILAFON) 16 MG tablet Take 1 pill total 16 mg every morning and take 2 pills by mouth total 32 mg every bedtime 270 tablet 1  . spironolactone (ALDACTONE) 25 MG tablet Take 0.5 tablets (12.5 mg total) by mouth daily. 45 tablet 3  . ticagrelor (BRILINTA) 90 MG TABS tablet Take 1 tablet (90 mg total) by mouth 2 (two) times daily. 60 tablet 11  . traZODone (DESYREL) 100 MG tablet TAKE 3 TABLETS BY MOUTH EVERY DAY AT BEDTIME AS NEEDED FOR SLEEP 270 tablet 0   Allergies: Allergies as of 09/11/2020 - Review Complete 09/11/2020  Allergen Reaction Noted  . Gluten meal Other (See Comments) 12/29/2014   Past Medical History:  Diagnosis Date  . Anxiety   . Celiac disease   . Coronary artery disease   . Dairy allergy   . Depression   . Diabetes mellitus without complication (HCC)   . High cholesterol   . Hypertension   . Paranoia (HCC)   . Pre-diabetes   . Schizoaffective disorder (HCC)     Family History:  -Diabetes and HTN in mother and father  Social History: -current smoker, 2 ppd x 10 years -no alcohol or recreational drug use -lives alone  Review of Systems: A complete ROS was negative except as per HPI.   Physical Exam: Blood pressure 130/85, pulse (!) 103, temperature 98.3 F (36.8 C), temperature source Oral, resp. rate 18, height 6\' 2"  (1.88 m), weight 122.5 kg, SpO2 98 %.  Physical  Exam Constitutional:      Appearance: He is obese. He is not ill-appearing or diaphoretic.  HENT:     Head: Normocephalic and atraumatic.  Eyes:     Extraocular Movements: Extraocular movements intact.     Pupils: Pupils are equal, round, and reactive to light.  Neck:     Vascular: No JVD.  Cardiovascular:     Rate and Rhythm: Normal rate and regular rhythm.     Pulses:          Radial pulses are 2+ on the right side and 2+ on the left side.       Dorsalis pedis pulses are 2+ on the  right side and 2+ on the left side.     Heart sounds: Normal heart sounds. No murmur heard. No friction rub. No gallop.   Pulmonary:     Effort: Pulmonary effort is normal.     Breath sounds: Normal breath sounds. No wheezing, rhonchi or rales.  Chest:     Chest wall: No tenderness.  Abdominal:     General: Bowel sounds are normal.     Palpations: Abdomen is soft.     Tenderness: There is no guarding or rebound.     Comments: Mildly distended, TTP in periumbilical region. No hernia noted.  Musculoskeletal:        General: Normal range of motion.     Cervical back: Normal range of motion.     Right lower leg: No edema.     Left lower leg: No edema.  Lymphadenopathy:     Cervical: No cervical adenopathy.  Skin:    General: Skin is warm and dry.  Neurological:     General: No focal deficit present.     Mental Status: He is alert and oriented to person, place, and time.  Psychiatric:        Mood and Affect: Mood normal.        Behavior: Behavior normal.      EKG: personally reviewed my interpretation is sinus rhythm with 1st degree AV block and left posterior fascicular block.  DG Chest 2 View  Result Date: 09/11/2020 CLINICAL DATA:  Chest pain. EXAM: CHEST - 2 VIEW COMPARISON:  September 09, 2020 FINDINGS: The heart size and mediastinal contours are within normal limits. Both lungs are clear. The visualized skeletal structures are unremarkable. IMPRESSION: No active cardiopulmonary disease.  Electronically Signed   By: Gerome Samavid  Williams III M.D   On: 09/11/2020 13:11   CT Abdomen Pelvis W Contrast  Result Date: 09/11/2020 CLINICAL DATA:  Umbilical pain X 3 days, chest pain, nausea, vomiting EXAM: CT ABDOMEN AND PELVIS WITH CONTRAST TECHNIQUE: Multidetector CT imaging of the abdomen and pelvis was performed using the standard protocol following bolus administration of intravenous contrast. CONTRAST:  100mL OMNIPAQUE IOHEXOL 300 MG/ML  SOLN COMPARISON:  08/17/2016 FINDINGS: Lower chest: Coronary calcifications. No pleural or pericardial effusion. Hepatobiliary: Fatty liver. No focal lesion. No biliary ductal dilatation. Gallbladder decompressed. Pancreas: Unremarkable. No pancreatic ductal dilatation or surrounding inflammatory changes. Spleen: Normal in size without focal abnormality. Adrenals/Urinary Tract: Normal adrenal glands. 1.8 cm probable cyst from the upper pole left kidney. No urolithiasis or hydronephrosis. Urinary bladder is physiologically distended. Stomach/Bowel: Stomach incompletely distended. Small bowel decompressed. Appendix surgically absent. The colon is nondilated, unremarkable. Vascular/Lymphatic: No significant vascular findings are present. No enlarged abdominal or pelvic lymph nodes. Reproductive: Mild prostate enlargement with central coarse calcification. Other: Bilateral pelvic phleboliths.  No ascites.  No free air. Musculoskeletal: Chronic degenerative disc disease L3-4, L4-5. 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 No acute fracture or worrisome bone lesion. IMPRESSION: 1. No acute findings. 2. Fatty liver. 3. Coronary calcifications. The severity of coronary artery disease and any potential stenosis cannot be assessed on this non-gated CT examination. Electronically Signed   By: Corlis Leak  Hassell M.D.   On: 09/11/2020 14:55     Assessment & Plan by Problem: Active Problems:   * No active hospital problems. *   Atypical Chest Pain CAD with NSTEMI s/p  drug-eluting stent (in January 2022) Presenting with right-sided chest pain, periumbilical pain, and N/V. Patient with multiple visits with complaint of chest pain, most recently 2  days ago with negative cardiopulmonary workup (negative trops, d-dimer, and CT chest).  EKG showing sinus rhythm with no acute changes. Troponins flat. Chest pain did not resolve with nitroglycerin and nitroglycerin gave patient headache (refusing further nitroglycerin). Cardiology does not suspect ACS, but will evaluate patient in the morning and possibly conduct stress testing. CT abd/pel done for periumbilical pain and N/V but was negative. Follows Dr. Shari Prows for outpatient cardiology. Cardiac monitor on 07/19/2020 (worn for 7 days) showed predominant rhythm was sinus. TTE on 08/12/20 showed LVED 65-70% with mild LVH but was otherwise unremarkable. -morphine given in ED -cardiology following, appreciate recommendations -possible stress testing tomorrow per cardiology -cardiac monitoring -heart healthy/carb modified diet -continue ASA, brilinta, lipitor  Acute on Chronic Leukocytosis Periumbilical pain N/V Hx of celiac disease No documented antibody testing for diagnosis of celiac disease, but seen on numerous prior notes from different providers and reported by patient. Per prior notes, patient's celiac disease was diet controlled. However, patient reports that he does eat gluten (including rice, bread, biscuits). Patient with slightly worsened leukocytosis and hyponatremia along with N/V and periumbilical pain which could be explained by progression of untreated celiac disease especially in the setting of consuming gluten products. Patient is afebrile with no signs of acute infection at this time. No episodes of vomiting since arriving to ED. Will get antibodies to check for celiac disease. -f/u IgA/G + tTG IgA -f/u fecal calprotectin  Acute on Chronic Hyponatremia Chronically hyponatremic in low 130s. On admission,  sodium 126. This could be multifactorial as patient is on several antipsychotics (including lithium), antidiuretic (spironolactone), and history of celiac disease.  -f/u urinalysis -f/u urine sodium, urine osm, urine urea nitrogen, urine creatinine  Hypertension Hyperlipidemia On coreg 12.5mg  BID, imdur 15mg  daily, jardiance 25mg , and lisinopril 20mg  daily. On lipitor 40mg  daily. -continue home medications  Uncontrolled T2DM w/o complications On tresiba 16 units daily, jardiance 25mg  daily, and metformin 1000mg  BID at home. HbA1c of 12.3 on 08/15/2020, after which tresiba was added to regimen.  -started lantus 10 units qhs, moderate SSI  Schizoaffective disorder, bipolar type Chronic anxiety On perphenazine 16mg  in the morning and 32mg  qhs, cymbalta 60mg  BID, gabapentin 800mg  TID, and lithium 600mg  qhs. Also on chronic klonopin 1mg  TID prn for chronic anxiety. Patient takes trazodone at bedtime for insomnia.  -hold lithium at this time -continue other home medications  Dispo: Admit patient to Observation with expected length of stay less than 2 midnights.  Signed: , MD 09/11/2020, 6:09 PM  Pager: [phone] After 5pm on weekdays and 1pm on weekends: On Call pager: [phone]

## 2020-09-11 NOTE — ED Provider Notes (Signed)
[facility] EMERGENCY DEPARTMENT Provider Note   CSN: [id number] Arrival date & time: 09/11/20  1227     History Chief Complaint  Patient presents with  . Chest Pain    Matthew [name] is a 45 y.o. male.  HPI   45 year old male with past medical history of CAD with NSTEMI and recent stenting 3 months ago in January/2022, HTN, HLD presents the emergency department right-sided chest pain and periumbilical abdominal pain.  Patient states has been going on for the past 3 to 4 days.  He has associated nausea/vomiting.  Denies any shortness of breath or cough.  He was seen in an emergency department 2 days ago with an unremarkable work-up.  He states the pain has not relieved itself and continues.  He denies any diarrhea, swelling of his lower extremities, fever.  Past Medical History:  Diagnosis Date  . Anxiety   . Celiac disease   . Coronary artery disease   . Dairy allergy   . Depression   . Diabetes mellitus without complication (HCC)   . High cholesterol   . Hypertension   . Paranoia (HCC)   . Pre-diabetes   . Schizoaffective disorder [facility])     Patient Active Problem List   Diagnosis Date Noted  . History of hyperprolactinemia 08/16/2020  . Mixed dyslipidemia 08/15/2020  . Chest pain 06/06/2020  . Type 2 diabetes mellitus with hyperglycemia (HCC) 06/06/2020  . Hyperkalemia 06/06/2020  . Thrombocytopenia (HCC) 06/06/2020  . Obesity (BMI 30.0-34.9) 06/06/2020  . Non-ST elevation (NSTEMI) myocardial infarction (HCC)   . Attention deficit hyperactivity disorder (ADHD), combined type, moderate 03/10/2018  . Generalized anxiety disorder 03/10/2018  . Schizoaffective disorder, bipolar type without good prognostic features (HCC) 02/09/2015  . Morbid obesity (HCC) 01/18/2015  . OSA (obstructive sleep apnea) 07/14/2014  . Solitary pulmonary nodule 07/14/2014    Past Surgical History:  Procedure Laterality Date  . APPENDECTOMY    . BRAIN SURGERY    .  CARDIAC CATHETERIZATION    . CORONARY STENT INTERVENTION N/A 06/06/2020   Procedure: CORONARY STENT INTERVENTION;  Surgeon: Swaziland, Peter M, MD;  Location: [facility] INVASIVE CV LAB;  Service: Cardiovascular;  Laterality: N/A;  prox LAD, distal RCA  . INTRAVASCULAR ULTRASOUND/IVUS N/A 06/06/2020   Procedure: Intravascular Ultrasound/IVUS;  Surgeon: Swaziland, Peter M, MD;  Location: [facility] INVASIVE CV LAB;  Service: Cardiovascular;  Laterality: N/A;  . LEFT HEART CATH AND CORONARY ANGIOGRAPHY N/A 06/06/2020   Procedure: LEFT HEART CATH AND CORONARY ANGIOGRAPHY;  Surgeon: Swaziland, Peter M, MD;  Location: [facility] INVASIVE CV LAB;  Service: Cardiovascular;  Laterality: N/A;  . NASAL SEPTUM SURGERY    . WRIST FRACTURE SURGERY     right wrist       Family History  Problem Relation Age of Onset  . Asthma Mother   . Hypertension Mother   . Hypertension Father     Social History   Tobacco Use  . Smoking status: Heavy Tobacco Smoker    Packs/day: 2.00    Types: Cigarettes  . Smokeless tobacco: Never Used  . Tobacco comment: patient given phone number to 1-800-quit now on 08/16/20  Vaping Use  . Vaping Use: Never used  Substance Use Topics  . Alcohol use: No    Alcohol/week: 0.0 standard drinks    Comment: no (02/08/15)  . Drug use: No    Home Medications Prior to Admission medications   Medication Sig Start Date End Date Taking? Authorizing Provider  aspirin EC 81 MG  tablet Take 1 tablet (81 mg total) by mouth daily. Swallow whole. 06/07/20   Meriam Sprague, MD  atenolol (TENORMIN) 100 MG tablet Take 100 mg by mouth every morning. 09/03/20   [provider]  atorvastatin (LIPITOR) 40 MG tablet Take 1 tablet (40 mg total) by mouth daily. 06/22/20 09/20/20  Meriam Sprague, MD  carvedilol (COREG) 12.5 MG tablet Take 1 tablet (12.5 mg total) by mouth 2 (two) times daily with a meal. 08/31/20   Pemberton, Kathlynn Grate, MD  clonazePAM (KLONOPIN) 1 MG tablet Take 1 tablet (1 mg total) by mouth 3  (three) times daily as needed for anxiety. 07/28/20   Cherie Ouch, PA-C  DULoxetine (CYMBALTA) 60 MG capsule Take 1 capsule (60 mg total) by mouth 2 (two) times daily. 09/08/20   Cherie Ouch, PA-C  empagliflozin (JARDIANCE) 25 MG TABS tablet Take 1 tablet (25 mg total) by mouth daily before breakfast. 08/15/20   Shamleffer, Konrad Dolores, MD  gabapentin (NEURONTIN) 800 MG tablet TAKE 1 TABLET BY MOUTH THREE TIMES A DAY 03/29/20   Chauncey Mann, MD  insulin degludec (TRESIBA FLEXTOUCH) 100 UNIT/ML FlexTouch Pen Inject 16 Units into the skin daily. Patient not taking: Reported on 08/31/2020 08/26/20   Shamleffer, Konrad Dolores, MD  Insulin Pen Needle 32G X 4 MM MISC 1 Device by Does not apply route daily. 08/15/20   Shamleffer, Konrad Dolores, MD  isosorbide mononitrate (IMDUR) 30 MG 24 hr tablet Take 0.5 tablets (15 mg total) by mouth daily. 08/10/20   Wynetta Fines, MD  lisinopril (ZESTRIL) 20 MG tablet Take 1 tablet (20 mg total) by mouth daily. 06/08/20   Dyann Kief, PA-C  lithium carbonate (LITHOBID) 300 MG CR tablet TAKE 2 TABLETS (600 MG TOTAL) BY MOUTH AT BEDTIME. 03/29/20   Chauncey Mann, MD  metFORMIN (GLUCOPHAGE) 1000 MG tablet Take 1 tablet (1,000 mg total) by mouth 2 (two) times daily with a meal. 08/15/20   Shamleffer, Konrad Dolores, MD  nitroGLYCERIN (NITROSTAT) 0.4 MG SL tablet Place 1 tablet (0.4 mg total) under the tongue every 5 (five) minutes as needed for chest pain. 08/10/20 08/10/21  Filbert Schilder, NP  perphenazine (TRILAFON) 16 MG tablet Take 1 pill total 16 mg every morning and take 2 pills by mouth total 32 mg every bedtime 03/29/20   Chauncey Mann, MD  spironolactone (ALDACTONE) 25 MG tablet Take 0.5 tablets (12.5 mg total) by mouth daily. 07/20/20 07/15/21  Meriam Sprague, MD  ticagrelor (BRILINTA) 90 MG TABS tablet Take 1 tablet (90 mg total) by mouth 2 (two) times daily. 06/07/20   Meriam Sprague, MD  traZODone (DESYREL) 100 MG tablet TAKE 3  TABLETS BY MOUTH EVERY DAY AT BEDTIME AS NEEDED FOR SLEEP 09/08/20   Melony Overly T, PA-C    Allergies    Gluten meal  Review of Systems   Review of Systems  Constitutional: Negative for chills and fever.  HENT: Negative for congestion.   Eyes: Negative for visual disturbance.  Respiratory: Negative for cough and shortness of breath.   Cardiovascular: Positive for chest pain. Negative for palpitations and leg swelling.  Gastrointestinal: Positive for abdominal pain, nausea and vomiting. Negative for diarrhea.  Genitourinary: Negative for dysuria.  Skin: Negative for rash.  Neurological: Negative for headaches.    Physical Exam Updated Vital Signs BP 126/89 (BP Location: Right Arm)   Pulse 100   Temp 98.3 F (36.8 C) (Oral)   Resp 18  Ht 6\' 2"  (1.88 m)   Wt 122.5 kg   SpO2 97%   BMI 34.67 kg/m   Physical Exam Vitals and nursing note reviewed.  Constitutional:      Appearance: Normal appearance.  HENT:     Head: Normocephalic.     Mouth/Throat:     Mouth: Mucous membranes are moist.  Cardiovascular:     Rate and Rhythm: Normal rate.  Pulmonary:     Effort: Pulmonary effort is normal. No tachypnea or respiratory distress.     Breath sounds: Examination of the right-lower field reveals decreased breath sounds. Examination of the left-lower field reveals decreased breath sounds. Decreased breath sounds present.  Abdominal:     Palpations: Abdomen is soft.     Comments: Mild tenderness to the periumbilical area without any rebound, benign exam  Skin:    General: Skin is warm.  Neurological:     Mental Status: He is alert and oriented to person, place, and time. Mental status is at baseline.  Psychiatric:        Mood and Affect: Mood normal.     ED Results / Procedures / Treatments   Labs (all labs ordered are listed, but only abnormal results are displayed) Labs Reviewed  BASIC METABOLIC PANEL - Abnormal; Notable for the following components:      Result Value    Sodium 126 (*)    Chloride 94 (*)    CO2 20 (*)    Glucose, Bld 199 (*)    All other components within normal limits  CBC - Abnormal; Notable for the following components:   WBC 19.6 (*)    RBC 5.91 (*)    All other components within normal limits  HEPATIC FUNCTION PANEL  TROPONIN I (HIGH SENSITIVITY)  TROPONIN I (HIGH SENSITIVITY)    EKG EKG Interpretation  Date/Time:  Sunday September 11 2020 12:45:18 EDT Ventricular Rate:  100 PR Interval:  214 QRS Duration: 112 QT Interval:  348 QTC Calculation: 448 R Axis:   142 Text Interpretation: Sinus rhythm with 1st degree A-V block Left posterior fascicular block Possible Inferior infarct , age undetermined Possible Anterior infarct , age undetermined Abnormal ECG similar to previous Confirmed by [birth year]20[birth year]1984 [phone]) on 09/11/2020 2:22:52 PM   Radiology DG Chest 2 View  Result Date: 09/11/2020 CLINICAL DATA:  Chest pain. EXAM: CHEST - 2 VIEW COMPARISON:  September 09, 2020 FINDINGS: The heart size and mediastinal contours are within normal limits. Both lungs are clear. The visualized skeletal structures are unremarkable. IMPRESSION: No active cardiopulmonary disease. Electronically Signed   By: September 11, 2020 III M.D   On: 09/11/2020 13:11   DG Chest 2 View  Result Date: 09/09/2020 CLINICAL DATA:  Chest pain EXAM: CHEST - 2 VIEW COMPARISON:  08/10/2020 FINDINGS: The heart size and mediastinal contours are within normal limits. No focal airspace consolidation, pleural effusion, or pneumothorax. The visualized skeletal structures are unremarkable. IMPRESSION: No active cardiopulmonary disease. Electronically Signed   By: 08/12/2020 D.O.   On: 09/09/2020 19:23   CT Chest W Contrast  Result Date: 09/09/2020 CLINICAL DATA:  Chest pain or SOB, pleurisy or effusion suspected Pneumonia, effusion or abscess suspected, xray done EXAM: CT CHEST WITH CONTRAST TECHNIQUE: Multidetector CT imaging of the chest was performed during intravenous  contrast administration. CONTRAST:  17mL OMNIPAQUE IOHEXOL 300 MG/ML  SOLN COMPARISON:  Chest radiograph August 10, 2020 and chest CT image [id number]. FINDINGS: Cardiovascular: No significant vascular findings. No thoracic aortic aneurysm. No central pulmonary  embolus. Normal heart size. No pericardial effusion. Left anterior descending coronary artery calcifications. Mediastinum/Nodes: No enlarged mediastinal, hilar, or axillary lymph nodes. Thyroid gland, trachea, and esophagus demonstrate no significant findings. Lungs/Pleura: Right lower lobe atelectasis versus scarring. No focal consolidation. No suspicious pulmonary nodules or masses. No pleural effusion. No pneumothorax. Upper Abdomen: 1.6 cm exophytic left interpolar renal cyst. Acute abnormality. Musculoskeletal: No chest wall abnormality. No acute or significant osseous findings. IMPRESSION: 1. No acute findings in the chest. 2. Left anterior descending coronary artery calcifications. Please note that although the presence of coronary artery calcium documents the presence of coronary artery disease, the severity of this disease and any potential stenosis cannot be assessed on this non-gated CT examination. Assessment for potential risk factor modification, dietary therapy or pharmacologic therapy may be warranted, if clinically indicated Electronically Signed   By: Maudry MayhewJeffrey  Waltz MD   On: 09/09/2020 21:34    Procedures Procedures   Medications Ordered in ED Medications  ketorolac (TORADOL) 30 MG/ML injection 30 mg (has no administration in time range)  ondansetron (ZOFRAN) injection 4 mg (has no administration in time range)  iohexol (OMNIPAQUE) 300 MG/ML solution 100 mL (has no administration in time range)    ED Course  I have reviewed the triage vital signs and the nursing notes.  Pertinent labs & imaging results that were available during my care of the patient were reviewed by me and considered in my medical decision making (see chart for  details).    MDM Rules/Calculators/A&P                          45 year old male presents the emergency department for right-sided chest pain and periumbilical pain.  EKG appears unchanged for him.  Chest x-ray shows no acute finding.  Blood work shows a leukocytosis around 19, comparable to where this was 2 days ago.  When he was discharged in January he had a mild leukocytosis around 14.  Also shows a lower than baseline hyponatremia with a sodium of 126, baseline around 130.  Troponins are negative, no significant delta.  CT the abdomen pelvis does not identify any acute finding.  Chest pain was not resolved with nitroglycerin, gave the patient a headache and he is now refusing any further nitroglycerin.  Morphine was given.  However given his history and recent stenting about 3 months ago I spoke to cardiology.  They agree that his presentation seems very atypical however with his multiple visits with complaint of chest pain and ongoing chest pain they recommend admission, observation and they will evaluate the patient for potential retesting/stress test tomorrow.  On reevaluation patient continues to have right-sided chest pain.  With his history is high risk so he will be admitted for observation and further care.  Patients evaluation and results requires admission for further treatment and care. Patient agrees with admission plan, offers no new complaints and is stable/unchanged at time of admit.  Final Clinical Impression(s) / ED Diagnoses Final diagnoses:  None    Rx / DC Orders ED Discharge Orders    None       Rozelle LoganHorton, Saraiya Kozma M, DO 09/11/20 1811

## 2020-09-11 NOTE — ED Notes (Signed)
Report called to RN receiving pt in room 2C12.

## 2020-09-11 NOTE — ED Triage Notes (Signed)
C/o R sided chest pain with nausea an vomiting x 3-4 days.  Denies SOB.  Seen in ED 2 days ago for same.

## 2020-09-11 NOTE — Consult Note (Addendum)
Cardiology Consultation:   Patient ID: Matthew [name] MRN: [id number]; DOB: 05-Jul-1975  Admit date: 09/11/2020 Date of Consult: 09/11/2020  PCP:  [name], MD   [facility]   Cardiologist:  Meriam Sprague, MD  Advanced Practice Provider:  No care team member to display Electrophysiologist:  None   Click here to update Patient Care Team and Refresh Note - MD (PCP) or APP (Team Member)  Change PCP Type for MD, Specialty for APP is either Cardiology or Clinical Cardiac Electrophysiology  :[id number]}    Patient Profile:   Matthew [name] is a 45 y.o. male with a hx of HTN, HLD,  DM, celiac dz, schizoaffective d/o, anxiety, ADHD, OSAnot on CPAP, and recent NSTEMI s/p DES to pLAD and DES to dRCA, medication non compliance, prior h/o amphetamines who is being seen today for the evaluation of chest pain at the request of Dr Wilkie Aye.  History of Present Illness:   Matthew [name] is a 45 y.o. male with a hx of HTN, HLD,  DM, celiac dz, schizoaffective d/o, anxiety, ADHD, OSAnot on CPAP, and recent NSTEMI s/p DES to pLAD and DES to dRCA, medication non compliance, prior h/o amphetamines who is being seen today for the evaluation of chest pain   This is 2nd or 3rd visit for chest pain since stenting in January, all the times trops have been negative no ekg changes. Recently seen in the clinic on 4/13  The patient reports constant chest pain- right sided since 3 days- sharp in nature, sometimes worse, sometimes gets better on its own, did not take NTG- not on imdur at home, sometimes worse with exertion associated with nausea and umbilical pain. He reports compliance with meds- denies any missed doses. Continues to smoke.   ER work up: vitals; HR 101, BP 144/108, on RA. BMP Na126 (chronically hyponatremic but this is worse than before 130), Cr 09, trops negative 3/4 (previous ER visit negative trops) Wbc 19.6 EKG sinus tachy, hr 100, poor R wave progression,  right axis, jpoint elevation in inferior leads. CXR  CT abd/pelvis- negative Since being in the ER- he got some nitro but got headache so refusing nitro and with morphine his pain is better.  Cardiac work up: Patient admitted with NSTEMI 06/06/20 treated with DES pLAD and DES dRCA, and severe LVEF 25-30%. Patient left [facility] 06/08/19. Medication compliance has been an issuee ECHO showed EF 65-70%  Left Heart Cath 06/06/2020: 1. Severe 2 vessel obstructive CAD - 99% proximal LAD - 90% distal RCA 2. Severe LV dysfunction. EF estimated at 25-30%. Segmental wall motion abnormalities. 3. Moderately elevated LVEDP 4. Successful PCI of the proximal LAD with IVUS guidance and DES x 1 5. Successful PCI of the distal RCA with IVUS guidance and DES x 1  08/12/20 TTE: IMPRESSIONS  1. Left ventricular ejection fraction, by estimation, is 65 to 70%. The  left ventricle has normal function. The left ventricle has no regional  wall motion abnormalities. There is mild left ventricular hypertrophy.  Left ventricular diastolic parameters  were normal.  2. Right ventricular systolic function is normal. The right ventricular  size is normal.  3. The mitral valve is normal in structure. No evidence of mitral valve  regurgitation. No evidence of mitral stenosis.  4. The aortic valve is normal in structure. Aortic valve regurgitation is  not visualized. No aortic stenosis is present.  5. The inferior vena cava is normal in size with greater than 50%  respiratory variability, suggesting right atrial pressure of 3 mmHg.    Cardiac monitor 07/19/20: Patch Wear Time: 7 days and 17 hours (2022-02-12T16:36:22-0500 to 2022-02-20T10:06:03-499)  Patient had a min HR of 55 bpm, max HR of 135 bpm, and avg HR of 88 bpm. Predominant underlying rhythm was Sinus Rhythm. No Isolated SVEs, SVE Couplets, or SVE Triplets were present. Isolated VEs were rare (<1.0%), VE Couplets were rare (<1.0%), and no       VE Triplets were present.   Past Medical History:  Diagnosis Date  . Anxiety   . Celiac disease   . Coronary artery disease   . Dairy allergy   . Depression   . Diabetes mellitus without complication (HCC)   . High cholesterol   . Hypertension   . Paranoia (HCC)   . Pre-diabetes   . Schizoaffective disorder [facility])     Past Surgical History:  Procedure Laterality Date  . APPENDECTOMY    . BRAIN SURGERY    . CARDIAC CATHETERIZATION    . CORONARY STENT INTERVENTION N/A 06/06/2020   Procedure: CORONARY STENT INTERVENTION;  Surgeon: Swaziland, Peter M, MD;  Location: [facility] INVASIVE CV LAB;  Service: Cardiovascular;  Laterality: N/A;  prox LAD, distal RCA  . INTRAVASCULAR ULTRASOUND/IVUS N/A 06/06/2020   Procedure: Intravascular Ultrasound/IVUS;  Surgeon: Swaziland, Peter M, MD;  Location: [facility] INVASIVE CV LAB;  Service: Cardiovascular;  Laterality: N/A;  . LEFT HEART CATH AND CORONARY ANGIOGRAPHY N/A 06/06/2020   Procedure: LEFT HEART CATH AND CORONARY ANGIOGRAPHY;  Surgeon: Swaziland, Peter M, MD;  Location: [facility] INVASIVE CV LAB;  Service: Cardiovascular;  Laterality: N/A;  . NASAL SEPTUM SURGERY    . WRIST FRACTURE SURGERY     right wrist     Home Medications:  Prior to Admission medications   Medication Sig Start Date End Date Taking? Authorizing Provider  aspirin EC 81 MG tablet Take 1 tablet (81 mg total) by mouth daily. Swallow whole. 06/07/20   Meriam Sprague, MD  atenolol (TENORMIN) 100 MG tablet Take 100 mg by mouth every morning. 09/03/20   [provider]  atorvastatin (LIPITOR) 40 MG tablet Take 1 tablet (40 mg total) by mouth daily. 06/22/20 09/20/20  Meriam Sprague, MD  carvedilol (COREG) 12.5 MG tablet Take 1 tablet (12.5 mg total) by mouth 2 (two) times daily with a meal. 08/31/20   Pemberton, Kathlynn Grate, MD  clonazePAM (KLONOPIN) 1 MG tablet Take 1 tablet (1 mg total) by mouth 3 (three) times daily as needed for anxiety. 07/28/20   Cherie Ouch, PA-C  DULoxetine  (CYMBALTA) 60 MG capsule Take 1 capsule (60 mg total) by mouth 2 (two) times daily. 09/08/20   Cherie Ouch, PA-C  empagliflozin (JARDIANCE) 25 MG TABS tablet Take 1 tablet (25 mg total) by mouth daily before breakfast. 08/15/20   Shamleffer, Konrad Dolores, MD  gabapentin (NEURONTIN) 800 MG tablet TAKE 1 TABLET BY MOUTH THREE TIMES A DAY 03/29/20   Chauncey Mann, MD  insulin degludec (TRESIBA FLEXTOUCH) 100 UNIT/ML FlexTouch Pen Inject 16 Units into the skin daily. Patient not taking: Reported on 08/31/2020 08/26/20   Shamleffer, Konrad Dolores, MD  Insulin Pen Needle 32G X 4 MM MISC 1 Device by Does not apply route daily. 08/15/20   Shamleffer, Konrad Dolores, MD  isosorbide mononitrate (IMDUR) 30 MG 24 hr tablet Take 0.5 tablets (15 mg total) by mouth daily. 08/10/20   Wynetta Fines, MD  lisinopril (ZESTRIL) 20 MG tablet Take 1 tablet (20  mg total) by mouth daily. 06/08/20   Dyann Kief, PA-C  lithium carbonate (LITHOBID) 300 MG CR tablet TAKE 2 TABLETS (600 MG TOTAL) BY MOUTH AT BEDTIME. 03/29/20   Chauncey Mann, MD  metFORMIN (GLUCOPHAGE) 1000 MG tablet Take 1 tablet (1,000 mg total) by mouth 2 (two) times daily with a meal. 08/15/20   Shamleffer, Konrad Dolores, MD  nitroGLYCERIN (NITROSTAT) 0.4 MG SL tablet Place 1 tablet (0.4 mg total) under the tongue every 5 (five) minutes as needed for chest pain. 08/10/20 08/10/21  Filbert Schilder, NP  perphenazine (TRILAFON) 16 MG tablet Take 1 pill total 16 mg every morning and take 2 pills by mouth total 32 mg every bedtime 03/29/20   Chauncey Mann, MD  spironolactone (ALDACTONE) 25 MG tablet Take 0.5 tablets (12.5 mg total) by mouth daily. 07/20/20 07/15/21  Meriam Sprague, MD  ticagrelor (BRILINTA) 90 MG TABS tablet Take 1 tablet (90 mg total) by mouth 2 (two) times daily. 06/07/20   Meriam Sprague, MD  traZODone (DESYREL) 100 MG tablet TAKE 3 TABLETS BY MOUTH EVERY DAY AT BEDTIME AS NEEDED FOR SLEEP 09/08/20   Cherie Ouch,  PA-C    Inpatient Medications: Scheduled Meds:  Continuous Infusions:  PRN Meds: nitroGLYCERIN  Allergies:    Allergies  Allergen Reactions  . Gluten Meal Other (See Comments)    Celiac disease    Social History:   Social History   Socioeconomic History  . Marital status: Single    Spouse name: Not on file  . Number of children: 0  . Years of education: 34  . Highest education level: Associate degree: occupational, Scientist, product/process development, or vocational program  Occupational History  . Occupation: [employer], unemployed  Tobacco Use  . Smoking status: Heavy Tobacco Smoker    Packs/day: 2.00    Types: Cigarettes  . Smokeless tobacco: Never Used  . Tobacco comment: patient given phone number to 1-800-quit now on 08/16/20  Vaping Use  . Vaping Use: Never used  Substance and Sexual Activity  . Alcohol use: No    Alcohol/week: 0.0 standard drinks    Comment: no (02/08/15)  . Drug use: No  . Sexual activity: Not on file  Other Topics Concern  . Not on file  Social History Narrative   Pt lives at the [facility]   Social Determinants of Health   Financial Resource Strain: Not on file  Food Insecurity: Not on file  Transportation Needs: Not on file  Physical Activity: Not on file  Stress: Not on file  Social Connections: Not on file  Intimate Partner Violence: Not on file    Family History:    Family History  Problem Relation Age of Onset  . Asthma Mother   . Hypertension Mother   . Hypertension Father      ROS:  Please see the history of present illness.   All other ROS reviewed and negative.     Physical Exam/Data:   Vitals:   09/11/20 1415 09/11/20 1615 09/11/20 1630 09/11/20 1734  BP: 128/78 (!) 132/92 (!) 132/98 130/85  Pulse: 90 86 95 (!) 103  Resp: Temp:      TempSrc:      SpO2: 95% 93% 96% 98%  Weight:      Height:        Intake/Output Summary (Last 24 hours) at 09/11/2020 1833 Last data filed at 09/11/2020 1620 Gross per 24 hour   Intake 1000 ml  Output --  Net 1000 ml   Last 3 Weights 09/11/2020 09/09/2020 08/31/2020  Weight (lbs) 270 lb 255 lb 11.7 oz 255 lb 12.8 oz  Weight (kg) 122.471 kg 116 kg 116.03 kg  Some encounter information is confidential and restricted. Go to Review Flowsheets activity to see all data.     Body mass index is 34.67 kg/m.  General:  Well nourished, well developed, in no acute distress HEENT: normal Lymph: no adenopathy Neck: no JVD Endocrine:  No thryomegaly Vascular: No carotid bruits; FA pulses 2+ bilaterally without bruits  Cardiac:  normal S1, S2; RRR; no murmur  Lungs:  clear to auscultation bilaterally, no wheezing, rhonchi or rales  Abd: soft, nontender, no hepatomegaly  Ext: no edema Musculoskeletal:  No deformities, BUE and BLE strength normal and equal Skin: warm and dry  Neuro:  CNs 2-12 intact, no focal abnormalities noted Psych:  Normal affect   EKG:  The EKG was personally reviewed and demonstrates:  As above, sinus tachy Telemetry:  Telemetry was personally reviewed and demonstrates:    Relevant CV Studies: As above  Laboratory Data:  High Sensitivity Troponin:   Recent Labs  Lab 09/09/20 1925 09/09/20 2032 09/11/20 1249 09/11/20 1545  TROPONINIHS 4 4 3 4      Chemistry Recent Labs  Lab 09/09/20 1925 09/11/20 1249  NA 130* 126*  K 4.1 4.0  CL 96* 94*  CO2 20* 20*  GLUCOSE 136* 199*  BUN 12 12  CREATININE 0.97 0.94  CALCIUM 10.2 9.5  GFRNONAA >60 >60  ANIONGAP 14 12    Recent Labs  Lab 09/11/20 1417  PROT 7.4  ALBUMIN 4.4  AST 29  ALT 42  ALKPHOS 83  BILITOT 1.0   Hematology Recent Labs  Lab 09/09/20 1925 09/11/20 1249  WBC 19.9* 19.6*  RBC 6.27* 5.91*  HGB 17.8* 16.8  HCT 52.6* 50.7  MCV 83.9 85.8  MCH 28.4 28.4  MCHC 33.8 33.1  RDW 14.5 14.4  PLT 314 307   BNPNo results for input(s): BNP, PROBNP in the last 168 hours.  DDimer  Recent Labs  Lab 09/09/20 1925  DDIMER <0.27     Radiology/Studies:  DG Chest 2  View  Result Date: 09/11/2020 CLINICAL DATA:  Chest pain. EXAM: CHEST - 2 VIEW COMPARISON:  September 09, 2020 FINDINGS: The heart size and mediastinal contours are within normal limits. Both lungs are clear. The visualized skeletal structures are unremarkable. IMPRESSION: No active cardiopulmonary disease. Electronically Signed   By: September 11, 2020 III M.D   On: 09/11/2020 13:11   DG Chest 2 View  Result Date: 09/09/2020 CLINICAL DATA:  Chest pain EXAM: CHEST - 2 VIEW COMPARISON:  08/10/2020 FINDINGS: The heart size and mediastinal contours are within normal limits. No focal airspace consolidation, pleural effusion, or pneumothorax. The visualized skeletal structures are unremarkable. IMPRESSION: No active cardiopulmonary disease. Electronically Signed   By: 08/12/2020 D.O.   On: 09/09/2020 19:23   CT Chest W Contrast  Result Date: 09/09/2020 CLINICAL DATA:  Chest pain or SOB, pleurisy or effusion suspected Pneumonia, effusion or abscess suspected, xray done EXAM: CT CHEST WITH CONTRAST TECHNIQUE: Multidetector CT imaging of the chest was performed during intravenous contrast administration. CONTRAST:  42mL OMNIPAQUE IOHEXOL 300 MG/ML  SOLN COMPARISON:  Chest radiograph August 10, 2020 and chest CT image [id number]. FINDINGS: Cardiovascular: No significant vascular findings. No thoracic aortic aneurysm. No central pulmonary embolus. Normal heart size. No pericardial effusion. Left anterior descending coronary artery calcifications. Mediastinum/Nodes: No  enlarged mediastinal, hilar, or axillary lymph nodes. Thyroid gland, trachea, and esophagus demonstrate no significant findings. Lungs/Pleura: Right lower lobe atelectasis versus scarring. No focal consolidation. No suspicious pulmonary nodules or masses. No pleural effusion. No pneumothorax. Upper Abdomen: 1.6 cm exophytic left interpolar renal cyst. Acute abnormality. Musculoskeletal: No chest wall abnormality. No acute or significant osseous findings.  IMPRESSION: 1. No acute findings in the chest. 2. Left anterior descending coronary artery calcifications. Please note that although the presence of coronary artery calcium documents the presence of coronary artery disease, the severity of this disease and any potential stenosis cannot be assessed on this non-gated CT examination. Assessment for potential risk factor modification, dietary therapy or pharmacologic therapy may be warranted, if clinically indicated Electronically Signed   By: Maudry Mayhew MD   On: 09/09/2020 21:34   CT Abdomen Pelvis W Contrast  Result Date: 09/11/2020 CLINICAL DATA:  Umbilical pain X 3 days, chest pain, nausea, vomiting EXAM: CT ABDOMEN AND PELVIS WITH CONTRAST TECHNIQUE: Multidetector CT imaging of the abdomen and pelvis was performed using the standard protocol following bolus administration of intravenous contrast. CONTRAST:  OMNIPAQUE IOHEXOL 300 MG/ML  SOLN COMPARISON:  08/17/2016 FINDINGS: Lower chest: Coronary calcifications. No pleural or pericardial effusion. Hepatobiliary: Fatty liver. No focal lesion. No biliary ductal dilatation. Gallbladder decompressed. Pancreas: Unremarkable. No pancreatic ductal dilatation or surrounding inflammatory changes. Spleen: Normal in size without focal abnormality. Adrenals/Urinary Tract: Normal adrenal glands. 1.8 cm probable cyst from the upper pole left kidney. No urolithiasis or hydronephrosis. Urinary bladder is physiologically distended. Stomach/Bowel: Stomach incompletely distended. Small bowel decompressed. Appendix surgically absent. The colon is nondilated, unremarkable. Vascular/Lymphatic: No significant vascular findings are present. No enlarged abdominal or pelvic lymph nodes. Reproductive: Mild prostate enlargement with central coarse calcification. Other: Bilateral pelvic phleboliths.  No ascites.  No free air. Musculoskeletal: Chronic degenerative disc disease L3-4, L4-5. Bilateral L5 pars defects without  anterolisthesis. No acute fracture or worrisome bone lesion. IMPRESSION: 1. No acute findings. 2. Fatty liver. 3. Coronary calcifications. The severity of coronary artery disease and any potential stenosis cannot be assessed on this non-gated CT examination. Electronically Signed   By: Corlis Leak M.D.   On: 09/11/2020 14:55     Assessment and Plan:   1. Chest pain (atypical, probably cardiac) 2. CAD s/p PCI LAD, RCA on Jan 2022 3. 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 Chronic hyponatremia 5. Medication non compliance 6. HTN, HLD- uncontrolled 7. Poorly controlled DM-2 8. Obesity class 1 9. OSA- CPAP 10. ADHD, bipolar dx 11. Leukocytosis- unclear etiology   Plan: - patient with atypical chest pain (probably cardiac) but its right side sharp and unlike his prior NSTEMI in January. His enzymes are negative, ekg non ischemic, I do not believe this is typical cardiac pain and think its mostly non cardiac in nature  - he was just cathed in January s/p revascularization thus I don't think he needs a repeat stress test but he has been in the ER twice now- if he still has chest pain trow- then consider stress test. Keep him NPO after MN and hold his coreg- would do exercise NM stress  Trend trop and ekg - chronic hyponatremia- seems like from lithium/ssri's- f/w urine studies On exam is euvolemic. - continue home meds: DAPT (aspirin and brilinta 90mg  BID), atorva 40mg   - GDMT: on coreg, lisinopril. Although he does not take Imdur at home- I think its a good idea to keep him on low dose Imdur.  LDL 68  but TG 377: put him on zetia 10mg  daily, increase atorva to 80mg  daily., consider fibrates on outpatient basis. - poorly controlle DM-2, maintain euglycemia, insulin sliding scale - obesity- needs aggressive risk factor modification, need to quit smoking - management per primary team. - full code   Risk Assessment/Risk Scores:     HEAR Score (for undifferentiated chest pain):  HEAR Score: 3           For questions or updates, please contact [facility] Please consult [email or web address] for contact info under    Signed, Elmon Kirschnerobin Vern Guerette, MD  09/11/2020 6:33 PM

## 2020-09-11 NOTE — ED Notes (Signed)
ED Provider at bedside, Dr Horton 

## 2020-09-12 ENCOUNTER — Emergency Department (HOSPITAL_COMMUNITY): Payer: Medicaid Other

## 2020-09-12 ENCOUNTER — Encounter (HOSPITAL_COMMUNITY): Payer: Self-pay

## 2020-09-12 ENCOUNTER — Emergency Department (HOSPITAL_COMMUNITY)
Admission: EM | Admit: 2020-09-12 | Discharge: 2020-09-12 | Disposition: A | Discharge status: Home / Self Care | Payer: Medicaid Other | Attending: Emergency Medicine | Admitting: Emergency Medicine

## 2020-09-12 ENCOUNTER — Encounter (HOSPITAL_COMMUNITY): Payer: Medicaid Other

## 2020-09-12 ENCOUNTER — Emergency Department (HOSPITAL_COMMUNITY): Admit: 2020-09-12 | Discharge: 2020-09-12 | Disposition: A | Discharge status: Home / Self Care | Payer: Medicaid Other

## 2020-09-12 ENCOUNTER — Other Ambulatory Visit: Payer: Self-pay

## 2020-09-12 ENCOUNTER — Encounter (HOSPITAL_COMMUNITY): Payer: Self-pay | Admitting: *Deleted

## 2020-09-12 DIAGNOSIS — I2510 Atherosclerotic heart disease of native coronary artery without angina pectoris: Secondary | ICD-10-CM | POA: Insufficient documentation

## 2020-09-12 DIAGNOSIS — F17210 Nicotine dependence, cigarettes, uncomplicated: Secondary | ICD-10-CM | POA: Insufficient documentation

## 2020-09-12 DIAGNOSIS — R000 Tachycardia, unspecified: Secondary | ICD-10-CM | POA: Diagnosis not present

## 2020-09-12 DIAGNOSIS — Z7982 Long term (current) use of aspirin: Secondary | ICD-10-CM | POA: Insufficient documentation

## 2020-09-12 DIAGNOSIS — I214 Non-ST elevation (NSTEMI) myocardial infarction: Secondary | ICD-10-CM

## 2020-09-12 DIAGNOSIS — R5383 Other fatigue: Secondary | ICD-10-CM | POA: Diagnosis not present

## 2020-09-12 DIAGNOSIS — Z794 Long term (current) use of insulin: Secondary | ICD-10-CM | POA: Insufficient documentation

## 2020-09-12 DIAGNOSIS — Z955 Presence of coronary angioplasty implant and graft: Secondary | ICD-10-CM | POA: Diagnosis not present

## 2020-09-12 DIAGNOSIS — E119 Type 2 diabetes mellitus without complications: Secondary | ICD-10-CM | POA: Insufficient documentation

## 2020-09-12 DIAGNOSIS — R110 Nausea: Secondary | ICD-10-CM | POA: Diagnosis not present

## 2020-09-12 DIAGNOSIS — Z79899 Other long term (current) drug therapy: Secondary | ICD-10-CM | POA: Insufficient documentation

## 2020-09-12 DIAGNOSIS — Z7984 Long term (current) use of oral hypoglycemic drugs: Secondary | ICD-10-CM | POA: Insufficient documentation

## 2020-09-12 DIAGNOSIS — I10 Essential (primary) hypertension: Secondary | ICD-10-CM | POA: Insufficient documentation

## 2020-09-12 DIAGNOSIS — Z20822 Contact with and (suspected) exposure to covid-19: Secondary | ICD-10-CM | POA: Diagnosis not present

## 2020-09-12 DIAGNOSIS — D72829 Elevated white blood cell count, unspecified: Secondary | ICD-10-CM | POA: Diagnosis not present

## 2020-09-12 DIAGNOSIS — R0789 Other chest pain: Secondary | ICD-10-CM | POA: Insufficient documentation

## 2020-09-12 LAB — COMPREHENSIVE METABOLIC PANEL
ALT: 33 U/L (ref 0–44)
AST: 21 U/L (ref 15–41)
Albumin: 4 g/dL (ref 3.5–5.0)
Alkaline Phosphatase: 67 U/L (ref 38–126)
Anion gap: 10 (ref 5–15)
BUN: 14 mg/dL (ref 6–20)
CO2: 22 mmol/L (ref 22–32)
Calcium: 9 mg/dL (ref 8.9–10.3)
Chloride: 97 mmol/L — ABNORMAL LOW (ref 98–111)
Creatinine, Ser: 1.05 mg/dL (ref 0.61–1.24)
GFR, Estimated: >60 mL/min (ref >60)
Glucose, Bld: 178 mg/dL — ABNORMAL HIGH (ref 70–99)
Potassium: 3.9 mmol/L (ref 3.5–5.1)
Sodium: 129 mmol/L — ABNORMAL LOW (ref 135–145)
Total Bilirubin: 0.5 mg/dL (ref 0.3–1.2)
Total Protein: 6.5 g/dL (ref 6.5–8.1)

## 2020-09-12 LAB — URINALYSIS, ROUTINE W REFLEX MICROSCOPIC
Bacteria, UA: NONE SEEN
Bilirubin Urine: NEGATIVE
Glucose, UA: >=500 mg/dL — AB
Hgb urine dipstick: NEGATIVE
Ketones, ur: NEGATIVE mg/dL
Leukocytes,Ua: NEGATIVE
Nitrite: NEGATIVE
Protein, ur: NEGATIVE mg/dL
Specific Gravity, Urine: 1.02 (ref 1.005–1.030)
pH: 6 (ref 5.0–8.0)

## 2020-09-12 LAB — BASIC METABOLIC PANEL
Anion gap: 10 (ref 5–15)
BUN: 14 mg/dL (ref 6–20)
CO2: 20 mmol/L — ABNORMAL LOW (ref 22–32)
Calcium: 9.5 mg/dL (ref 8.9–10.3)
Chloride: 101 mmol/L (ref 98–111)
Creatinine, Ser: 0.91 mg/dL (ref 0.61–1.24)
GFR, Estimated: >60 mL/min (ref >60)
Glucose, Bld: 140 mg/dL — ABNORMAL HIGH (ref 70–99)
Potassium: 4.1 mmol/L (ref 3.5–5.1)
Sodium: 131 mmol/L — ABNORMAL LOW (ref 135–145)

## 2020-09-12 LAB — CBC WITH DIFFERENTIAL/PLATELET
Abs Immature Granulocytes: 0.1 10*3/uL — ABNORMAL HIGH (ref 0.00–0.07)
Basophils Absolute: 0.1 10*3/uL (ref 0.0–0.1)
Basophils Relative: 1 %
Eosinophils Absolute: 0.6 10*3/uL — ABNORMAL HIGH (ref 0.0–0.5)
Eosinophils Relative: 4 %
HCT: 46.2 % (ref 39.0–52.0)
Hemoglobin: 15.5 g/dL (ref 13.0–17.0)
Immature Granulocytes: 1 %
Lymphocytes Relative: 25 %
Lymphs Abs: 4 10*3/uL (ref 0.7–4.0)
MCH: 28.4 pg (ref 26.0–34.0)
MCHC: 33.5 g/dL (ref 30.0–36.0)
MCV: 84.6 fL (ref 80.0–100.0)
Monocytes Absolute: 1.2 10*3/uL — ABNORMAL HIGH (ref 0.1–1.0)
Monocytes Relative: 7 %
Neutro Abs: 10 10*3/uL — ABNORMAL HIGH (ref 1.7–7.7)
Neutrophils Relative %: 62 %
Platelets: 266 10*3/uL (ref 150–400)
RBC: 5.46 MIL/uL (ref 4.22–5.81)
RDW: 14.6 % (ref 11.5–15.5)
WBC: 16 10*3/uL — ABNORMAL HIGH (ref 4.0–10.5)
nRBC: 0 % (ref 0.0–0.2)

## 2020-09-12 LAB — CBC
HCT: 49 % (ref 39.0–52.0)
Hemoglobin: 16.5 g/dL (ref 13.0–17.0)
MCH: 28.5 pg (ref 26.0–34.0)
MCHC: 33.7 g/dL (ref 30.0–36.0)
MCV: 84.6 fL (ref 80.0–100.0)
Platelets: 312 10*3/uL (ref 150–400)
RBC: 5.79 MIL/uL (ref 4.22–5.81)
RDW: 14.7 % (ref 11.5–15.5)
WBC: 17.5 10*3/uL — ABNORMAL HIGH (ref 4.0–10.5)
nRBC: 0 % (ref 0.0–0.2)

## 2020-09-12 LAB — CREATININE, URINE, RANDOM: Creatinine, Urine: 73.8 mg/dL

## 2020-09-12 LAB — SODIUM, URINE, RANDOM: Sodium, Ur: 11 mmol/L

## 2020-09-12 LAB — TROPONIN I (HIGH SENSITIVITY)
Troponin I (High Sensitivity): 4 ng/L (ref <18)
Troponin I (High Sensitivity): 3 ng/L (ref <18)

## 2020-09-12 LAB — MRSA PCR SCREENING: MRSA by PCR: NEGATIVE

## 2020-09-12 LAB — GLUCOSE, CAPILLARY: Glucose-Capillary: 152 mg/dL — ABNORMAL HIGH (ref 70–99)

## 2020-09-12 LAB — SARS CORONAVIRUS 2 (TAT 6-24 HRS): SARS Coronavirus 2: NEGATIVE

## 2020-09-12 LAB — OSMOLALITY, URINE: Osmolality, Ur: 399 mOsm/kg (ref 300–900)

## 2020-09-12 IMAGING — DX DG CHEST 2V
2 series · 2 of 2 positions shown · non-contrast
Comparison: [DATE]

CLINICAL DATA: Right-sided chest pain for 4 days. Nausea and
vomiting.

EXAM:
CHEST - 2 VIEW

[w chest pa]
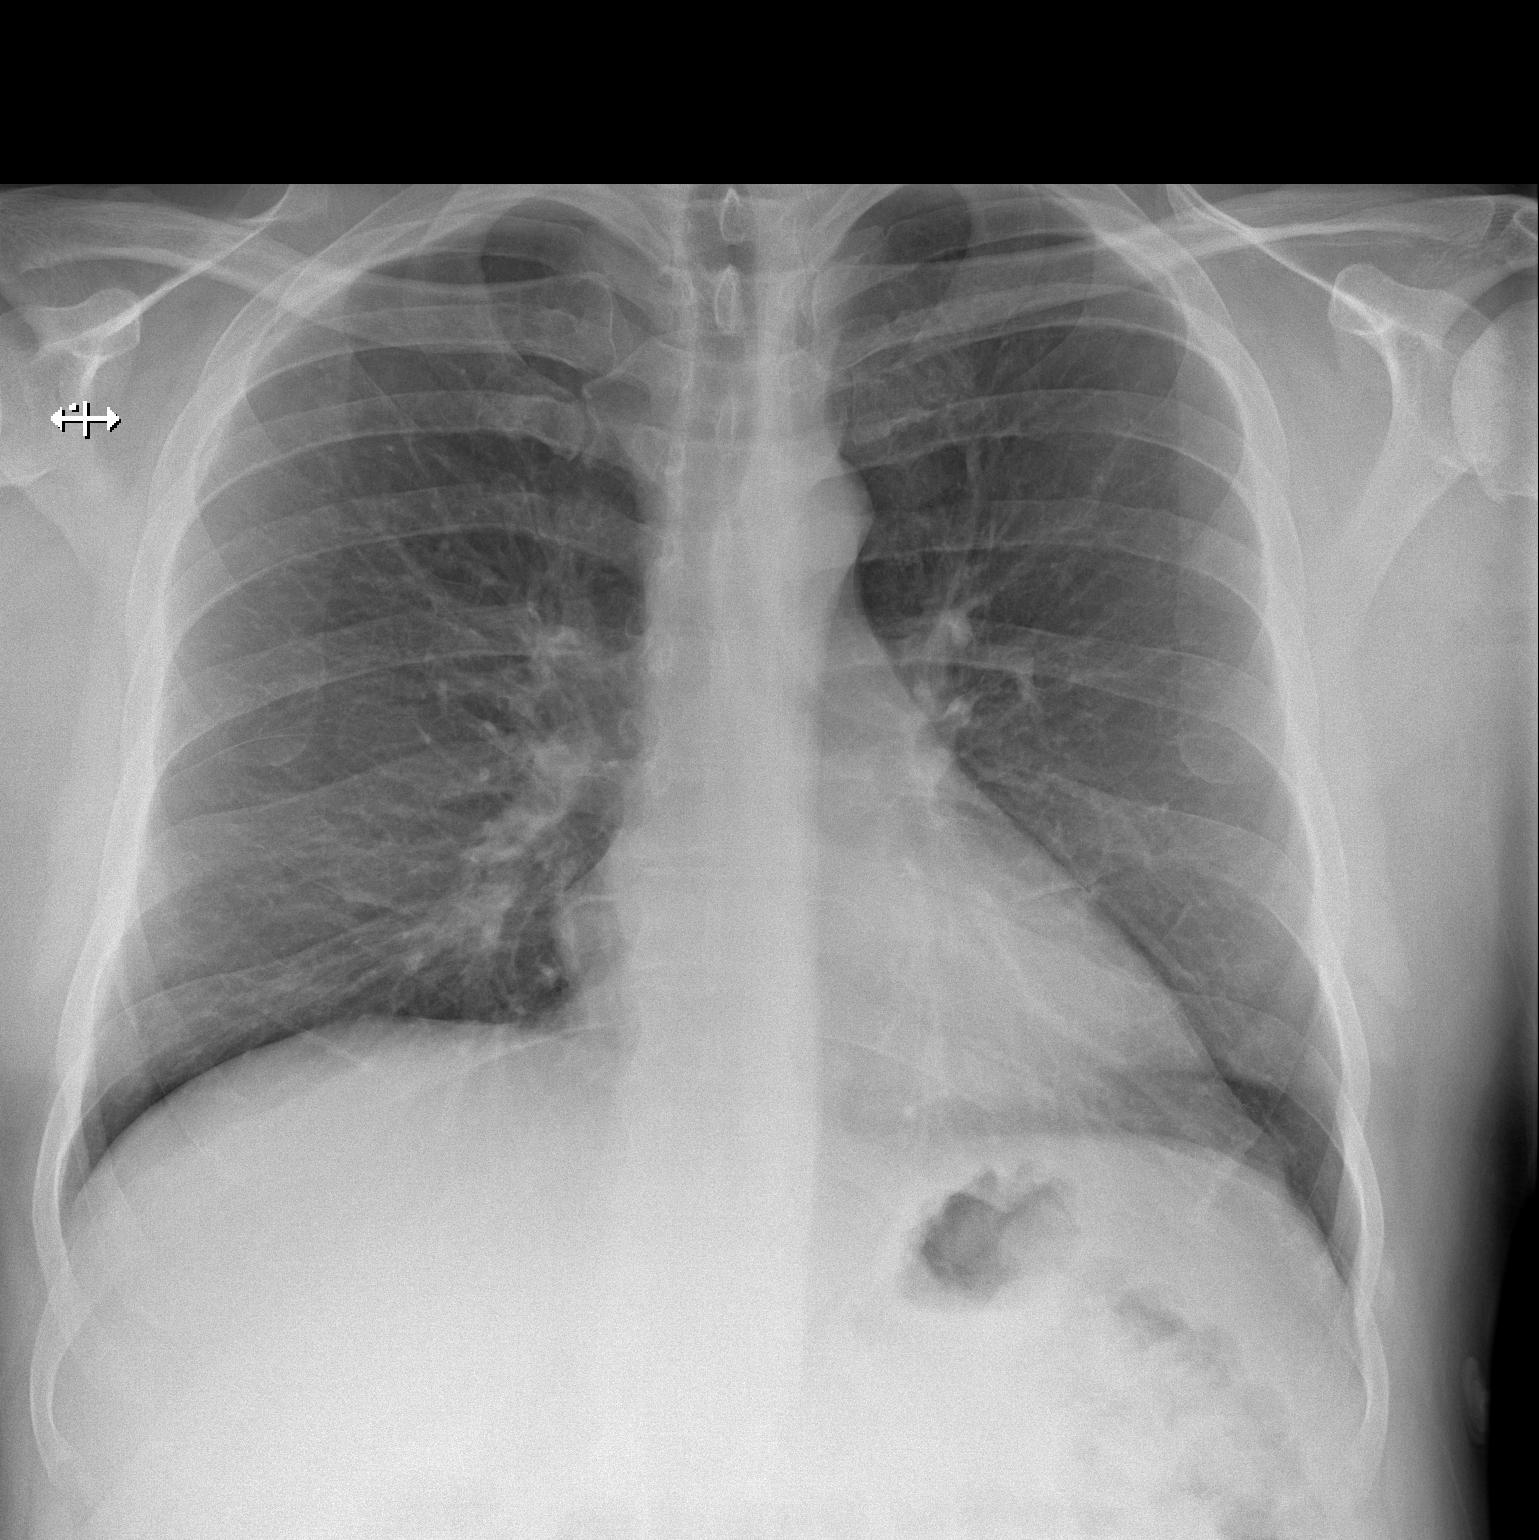

[w chest lat]
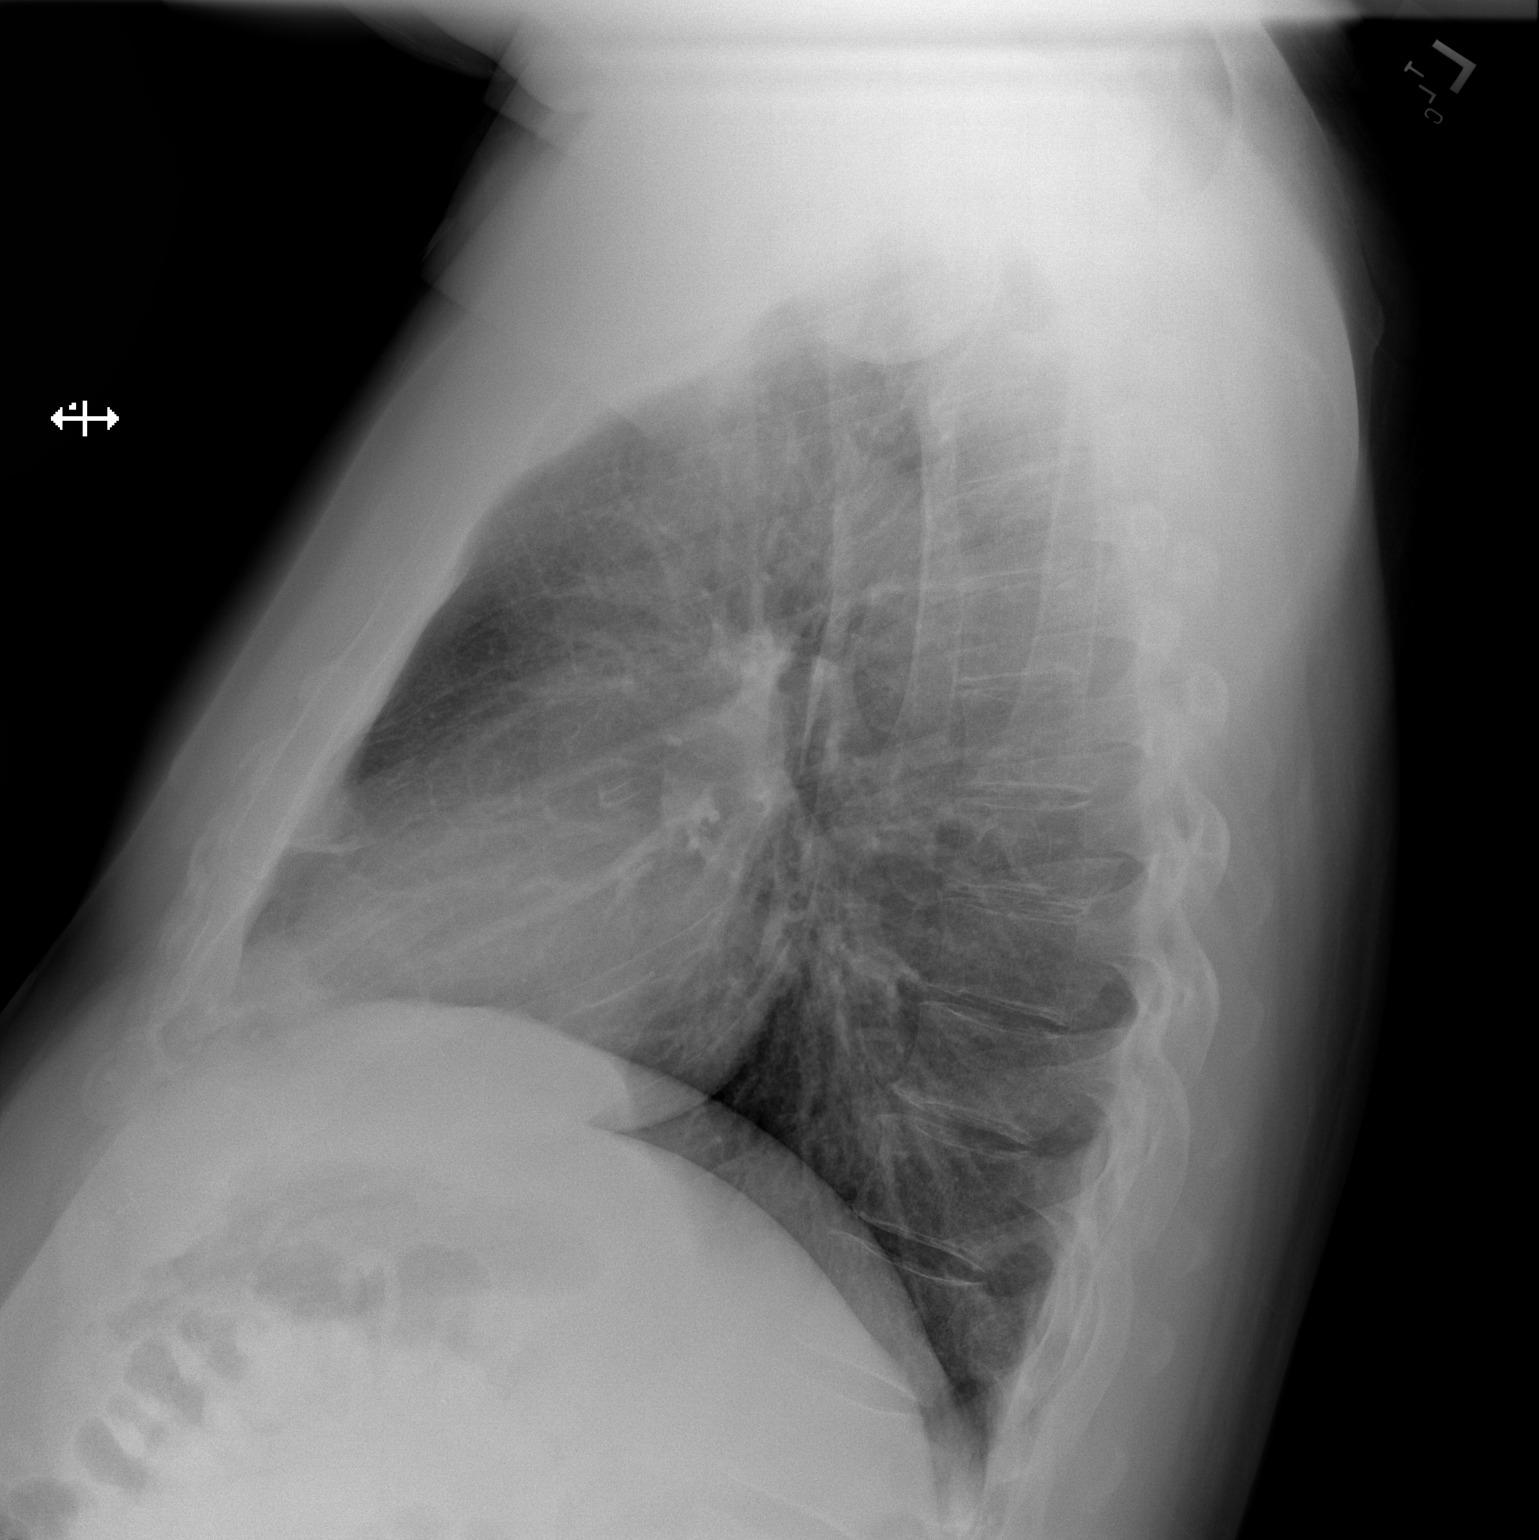

[2 of 2 positions shown; findings below may reference images not displayed]

FINDINGS: The cardiomediastinal silhouette is within normal limits. The lungs
are well inflated and clear. There is no evidence of pleural
effusion or pneumothorax. No acute osseous abnormality is
identified.
IMPRESSION: No active cardiopulmonary disease.

## 2020-09-12 MED ORDER — MORPHINE SULFATE (PF) 4 MG/ML IV SOLN
4.0000 mg | Freq: Once | INTRAVENOUS | Status: AC
  Administered 2020-09-12: 4 mg via INTRAVENOUS
  Filled 2020-09-12: qty 1

## 2020-09-12 NOTE — H&P (Addendum)
   Date: 09/11/2020               Patient Name:  Matthew [name] MRN: [id number]  DOB: 03/11/1976 Age / Sex: 45 y.o., male   PCP: Reese, Betti, MD         Medical Service: Internal Medicine Teaching Service         Attending Physician: Dr. Horton, Kristie M, DO    First Contact: Dr. Chen Pager: [phone]  Second Contact: Dr. Basaraba Pager: [phone]       After Hours (After 5p/  First Contact Pager: [phone]  weekends / holidays): Second Contact Pager: [phone]   Chief Complaint: chest pain, stomach pain, N/V  History of Present Illness:   [name] is a 45 y/o male with a PMHx of HTN, HLD, T2DM, CAD with NSTEMI s/p DES (05/2020), celiac disease, and schizoaffective disorder with bipolar disorder who presents to the ED with c/o right-sided chest pain, periumbilical pain, and nausea/vomiting.   Chest pain has been ongoing for 3 days, but states that he has had chest pain since January when he had his NSTEMI. It is right sided and shoots to his back. Pain is occasionally sharp, but otherwise dull and persistent. He does note light worsening with exercise, but no worsening with positional changes. He follows Dr. Pemberton in outpatient setting and states that addition of coreg, statin, imdur, and lisinopril has somewhat helped his chest pain but not fully relieved it. He does endorse some cold sweats, but otherwise denies fevers, chills, palpitations, SHOB, orthopnea.  He is a current daily smoker, trying to quit. 2 ppd x 10 years.   He also reports belly button pain, nausea, and NBNB vomiting as well, but these symptoms have also been chronic and ongoing for months. There is no clear association between his chest pain and these symptoms. He denies any constipation, diarrhea, urinary frequency, hematuria, dysuria, hematemesis, hematochezia, melena.   No family history of cancer, including leukemia or lymphoma. No recent infections or possible exposure to STI.   Of note, patient  confirms a hx of Celiac disease but states he does not have significant symptoms if he eats only small amounts of bread. He reports that he did have rice last night and had a biscuit this morning.    ED Course:  CBC with leukocytosis (19.6, chronic but worsened). BMP showing sodium 126 (chronic but worsened), bicarb 20, glucose 199, anion gap 12. Hepatic function panel wnl. Troponins negative. EKG with no acute changes. CT abd/pel showing no acute intraabdominal findings. Cardiology was consulted, believe it is atypical chest pain and in setting of recent stent placement, asked to admit for observation. IMTS asked to admit.  Meds:  No current facility-administered medications on file prior to encounter.   Current Outpatient Medications on File Prior to Encounter  Medication Sig Dispense Refill  . aspirin EC 81 MG tablet Take 1 tablet (81 mg total) by mouth daily. Swallow whole. 90 tablet 3  . atenolol (TENORMIN) 100 MG tablet Take 100 mg by mouth every morning.    . atorvastatin (LIPITOR) 40 MG tablet Take 1 tablet (40 mg total) by mouth daily. 90 tablet 3  . carvedilol (COREG) 12.5 MG tablet Take 1 tablet (12.5 mg total) by mouth 2 (two) times daily with a meal. 180 tablet 1  . clonazePAM (KLONOPIN) 1 MG tablet Take 1 tablet (1 mg total) by mouth 3 (three) times daily as needed for anxiety. 90 tablet 1  . DULoxetine (  CYMBALTA) 60 MG capsule Take 1 capsule (60 mg total) by mouth 2 (two) times daily. 180 capsule 0  . empagliflozin (JARDIANCE) 25 MG TABS tablet Take 1 tablet (25 mg total) by mouth daily before breakfast. 90 tablet 1  . gabapentin (NEURONTIN) 800 MG tablet TAKE 1 TABLET BY MOUTH THREE TIMES A DAY 270 tablet 1  . insulin degludec (TRESIBA FLEXTOUCH) 100 UNIT/ML FlexTouch Pen Inject 16 Units into the skin daily. (Patient not taking: Reported on 08/31/2020) 15 mL 11  . Insulin Pen Needle 32G X 4 MM MISC 1 Device by Does not apply route daily. 150 each 3  . isosorbide mononitrate (IMDUR)  30 MG 24 hr tablet Take 0.5 tablets (15 mg total) by mouth daily. 30 tablet 0  . lisinopril (ZESTRIL) 20 MG tablet Take 1 tablet (20 mg total) by mouth daily. 90 tablet 3  . lithium carbonate (LITHOBID) 300 MG CR tablet TAKE 2 TABLETS (600 MG TOTAL) BY MOUTH AT BEDTIME. 180 tablet 1  . metFORMIN (GLUCOPHAGE) 1000 MG tablet Take 1 tablet (1,000 mg total) by mouth 2 (two) times daily with a meal. 180 tablet 3  . nitroGLYCERIN (NITROSTAT) 0.4 MG SL tablet Place 1 tablet (0.4 mg total) under the tongue every 5 (five) minutes as needed for chest pain. 100 tablet 3  . perphenazine (TRILAFON) 16 MG tablet Take 1 pill total 16 mg every morning and take 2 pills by mouth total 32 mg every bedtime 270 tablet 1  . spironolactone (ALDACTONE) 25 MG tablet Take 0.5 tablets (12.5 mg total) by mouth daily. 45 tablet 3  . ticagrelor (BRILINTA) 90 MG TABS tablet Take 1 tablet (90 mg total) by mouth 2 (two) times daily. 60 tablet 11  . traZODone (DESYREL) 100 MG tablet TAKE 3 TABLETS BY MOUTH EVERY DAY AT BEDTIME AS NEEDED FOR SLEEP 270 tablet 0   Allergies: Allergies as of 09/11/2020 - Review Complete 09/11/2020  Allergen Reaction Noted  . Gluten meal Other (See Comments) 12/29/2014   Past Medical History:  Diagnosis Date  . Anxiety   . Celiac disease   . Coronary artery disease   . Dairy allergy   . Depression   . Diabetes mellitus without complication (HCC)   . High cholesterol   . Hypertension   . Paranoia (HCC)   . Pre-diabetes   . Schizoaffective disorder (HCC)     Family History:  -Diabetes and HTN in mother and father  Social History: -current smoker, 2 ppd x 10 years -no alcohol or recreational drug use -lives alone  Review of Systems: A complete ROS was negative except as per HPI.   Physical Exam: Blood pressure 130/85, pulse (!) 103, temperature 98.3 F (36.8 C), temperature source Oral, resp. rate 18, height 6' 2" (1.88 m), weight 122.5 kg, SpO2 98 %.  Physical  Exam Constitutional:      Appearance: He is obese. He is not ill-appearing or diaphoretic.  HENT:     Head: Normocephalic and atraumatic.  Eyes:     Extraocular Movements: Extraocular movements intact.     Pupils: Pupils are equal, round, and reactive to light.  Neck:     Vascular: No JVD.  Cardiovascular:     Rate and Rhythm: Normal rate and regular rhythm.     Pulses:          Radial pulses are 2+ on the right side and 2+ on the left side.       Dorsalis pedis pulses are 2+ on the   right side and 2+ on the left side.     Heart sounds: Normal heart sounds. No murmur heard. No friction rub. No gallop.   Pulmonary:     Effort: Pulmonary effort is normal.     Breath sounds: Normal breath sounds. No wheezing, rhonchi or rales.  Chest:     Chest wall: No tenderness.  Abdominal:     General: Bowel sounds are normal.     Palpations: Abdomen is soft.     Tenderness: There is no guarding or rebound.     Comments: Mildly distended, TTP in periumbilical region. No hernia noted.  Musculoskeletal:        General: Normal range of motion.     Cervical back: Normal range of motion.     Right lower leg: No edema.     Left lower leg: No edema.  Lymphadenopathy:     Cervical: No cervical adenopathy.  Skin:    General: Skin is warm and dry.  Neurological:     General: No focal deficit present.     Mental Status: He is alert and oriented to person, place, and time.  Psychiatric:        Mood and Affect: Mood normal.        Behavior: Behavior normal.      EKG: personally reviewed my interpretation is sinus rhythm with 1st degree AV block and left posterior fascicular block.  DG Chest 2 View  Result Date: 09/11/2020 CLINICAL DATA:  Chest pain. EXAM: CHEST - 2 VIEW COMPARISON:  September 09, 2020 FINDINGS: The heart size and mediastinal contours are within normal limits. Both lungs are clear. The visualized skeletal structures are unremarkable. IMPRESSION: No active cardiopulmonary disease.  Electronically Signed   By: David  Williams III M.D   On: 09/11/2020 13:11   CT Abdomen Pelvis W Contrast  Result Date: 09/11/2020 CLINICAL DATA:  Umbilical pain X 3 days, chest pain, nausea, vomiting EXAM: CT ABDOMEN AND PELVIS WITH CONTRAST TECHNIQUE: Multidetector CT imaging of the abdomen and pelvis was performed using the standard protocol following bolus administration of intravenous contrast. CONTRAST:  100mL OMNIPAQUE IOHEXOL 300 MG/ML  SOLN COMPARISON:  08/17/2016 FINDINGS: Lower chest: Coronary calcifications. No pleural or pericardial effusion. Hepatobiliary: Fatty liver. No focal lesion. No biliary ductal dilatation. Gallbladder decompressed. Pancreas: Unremarkable. No pancreatic ductal dilatation or surrounding inflammatory changes. Spleen: Normal in size without focal abnormality. Adrenals/Urinary Tract: Normal adrenal glands. 1.8 cm probable cyst from the upper pole left kidney. No urolithiasis or hydronephrosis. Urinary bladder is physiologically distended. Stomach/Bowel: Stomach incompletely distended. Small bowel decompressed. Appendix surgically absent. The colon is nondilated, unremarkable. Vascular/Lymphatic: No significant vascular findings are present. No enlarged abdominal or pelvic lymph nodes. Reproductive: Mild prostate enlargement with central coarse calcification. Other: Bilateral pelvic phleboliths.  No ascites.  No free air. Musculoskeletal: Chronic degenerative disc disease L3-4, L4-5. 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 No acute fracture or worrisome bone lesion. IMPRESSION: 1. No acute findings. 2. Fatty liver. 3. Coronary calcifications. The severity of coronary artery disease and any potential stenosis cannot be assessed on this non-gated CT examination. Electronically Signed   By: D  Hassell M.D.   On: 09/11/2020 14:55     Assessment & Plan by Problem: Active Problems:   * No active hospital problems. *   Atypical Chest Pain CAD with NSTEMI s/p  drug-eluting stent (in January 2022) Presenting with right-sided chest pain, periumbilical pain, and N/V. Patient with multiple visits with complaint of chest pain, most recently 2   days ago with negative cardiopulmonary workup (negative trops, d-dimer, and CT chest).  EKG showing sinus rhythm with no acute changes. Troponins flat. Chest pain did not resolve with nitroglycerin and nitroglycerin gave patient headache (refusing further nitroglycerin). Cardiology does not suspect ACS, but will evaluate patient in the morning and possibly conduct stress testing. CT abd/pel done for periumbilical pain and N/V but was negative. Follows Dr. Pemberton for outpatient cardiology. Cardiac monitor on 07/19/2020 (worn for 7 days) showed predominant rhythm was sinus. TTE on 08/12/20 showed LVED 65-70% with mild LVH but was otherwise unremarkable. -morphine given in ED -cardiology following, appreciate recommendations -possible stress testing tomorrow per cardiology -cardiac monitoring -heart healthy/carb modified diet -continue ASA, brilinta, lipitor  Acute on Chronic Leukocytosis Periumbilical pain N/V Hx of celiac disease No documented antibody testing for diagnosis of celiac disease, but seen on numerous prior notes from different providers and reported by patient. Per prior notes, patient's celiac disease was diet controlled. However, patient reports that he does eat gluten (including rice, bread, biscuits). Patient with slightly worsened leukocytosis and hyponatremia along with N/V and periumbilical pain which could be explained by progression of untreated celiac disease especially in the setting of consuming gluten products. Patient is afebrile with no signs of acute infection at this time. No episodes of vomiting since arriving to ED. Will get antibodies to check for celiac disease. -f/u IgA/G + tTG IgA -f/u fecal calprotectin  Acute on Chronic Hyponatremia Chronically hyponatremic in low 130s. On admission,  sodium 126. This could be multifactorial as patient is on several antipsychotics (including lithium), antidiuretic (spironolactone), and history of celiac disease.  -f/u urinalysis -f/u urine sodium, urine osm, urine urea nitrogen, urine creatinine  Hypertension Hyperlipidemia On coreg 12.5mg BID, imdur 15mg daily, jardiance 25mg, and lisinopril 20mg daily. On lipitor 40mg daily. -continue home medications  Uncontrolled T2DM w/o complications On tresiba 16 units daily, jardiance 25mg daily, and metformin 1000mg BID at home. HbA1c of 12.3 on 08/15/2020, after which tresiba was added to regimen.  -started lantus 10 units qhs, moderate SSI  Schizoaffective disorder, bipolar type Chronic anxiety On perphenazine 16mg in the morning and 32mg qhs, cymbalta 60mg BID, gabapentin 800mg TID, and lithium 600mg qhs. Also on chronic klonopin 1mg TID prn for chronic anxiety. Patient takes trazodone at bedtime for insomnia.  -hold lithium at this time -continue other home medications  Dispo: Admit patient to Observation with expected length of stay less than 2 midnights.  Signed: Otis Portal, MD 09/11/2020, 6:09 PM  Pager: [phone] After 5pm on weekdays and 1pm on weekends: On Call pager: [phone]  

## 2020-09-12 NOTE — Discharge Instructions (Signed)
Your cardiology office will call her to set up a close follow-up appointment for a cardiac stress test in the near future.  Please be on the look out for that phone call.  If you do not hear from them in the next 1 to 2 days, please call the office.  Return to the ER if you have any concern.

## 2020-09-12 NOTE — Progress Notes (Addendum)
Incomplete Session Note  Patient Details  Name: Matthew [name] MRN: [id number] Date of Birth: 29-Jun-1975 Referring Provider:   Flowsheet Row CARDIAC REHAB PHASE II ORIENTATION from 08/16/2020 in [facility] CARDI[facility]  Referring Provider Laurance Flatten, MD      [name] did not complete his rehab session.  [name] presented to cardiac rehab for exercise today.  Noted in pt history patient left AMA this morning from Chest pain, nausea and vomiting admission on yesterday.  Pt went to ER on Friday the 22nd with the same complaint. Advised pt that he would not be able to exercise today.  Additional guidance is needed from his cardiologist Dr. Shari Prows on what the next steps would be for any outpatient testing and whether [name] is appropriate for cardiac rehab due to non adherence to plan of care.  Pt blinking several times in a rushed manner and stated that he left because he was feeling anxious and he had not slept. [name] complains of upper back discomfort.  This differed from his initial pain with his January event.  [name] went on further and stated that he was currently having chest pain 5/6 out of 10.  Pt did not take any NTG but did on Friday and it did not help.  Pt transported to the ER via wheelchair.  Pt brought to triage desk and demographic information obtained.  Pt strongly encouraged to stay and complete any advised testing. Asked if I should call anyone to let them know he is in the ER.  Pt declined and stated he would call them later once he knew what was going on.  Alanson Aly, BSN Cardiac and Emergency planning/management officer

## 2020-09-12 NOTE — Progress Notes (Signed)
Pt wants to leave AMA. Dr [facility] & Dr Evie Lacks informed & spoke/ advised  pt. Pt still insist to leave AMA. IV removed. Pt signed AMA form. Pt left @ 6:20 AM.

## 2020-09-12 NOTE — ED Triage Notes (Signed)
Pt reports chest pain with N&V x4 days. Pt reports similar. Pt reports he was hosptialized. Left AMA this morning . H/o MI in January.

## 2020-09-12 NOTE — ED Provider Notes (Signed)
[facility]   CSN: [id number] Arrival date & time: 09/12/20  1326     History Chief Complaint  Patient presents with  . Chest Pain    Janit Bernicholas J Piggott is a 45 y.o. male.  The history is provided by the patient and medical records. No language interpreter was used.     45 year old male significant history of CAD with NSTEMI and recent stenting 3 months ago, schizophrenia, anxiety, diabetes presenting with chest pain.  Patient was last seen in the ED yesterday for his complaints of chest pain.  Due to his significant cardiac history but atypical presentation, the plan is to have inpatient stress test but patient left AMA.  Patient did not show up to the cardiac rehab for his session this morning.  He did report having active chest pain this morning and thus was brought to the ED for evaluation.  Cardiac stress test was not performed due to patient's active chest pain.  He states his pain is been a persistent pain for the past 4 days, exertional, with associated nausea and fatigue.  Admits to smoking 2 packs of cigarettes a day.  Denies alcohol abuse or drug abuse.  Past Medical History:  Diagnosis Date  . Anxiety   . Celiac disease   . Coronary artery disease   . Dairy allergy   . Depression   . Diabetes mellitus without complication (HCC)   . High cholesterol   . Hypertension   . Paranoia (HCC)   . Pre-diabetes   . Schizoaffective disorder [facility](HCC)     Patient Active Problem List   Diagnosis Date Noted  . Right-sided chest pain 09/11/2020  . Coronary artery disease of bypass graft of native heart with stable angina pectoris (HCC)   . Uncontrolled type 2 diabetes mellitus with hyperglycemia (HCC)   . Hyponatremia   . Essential hypertension   . History of hyperprolactinemia 08/16/2020  . Mixed dyslipidemia 08/15/2020  . Chest pain 06/06/2020  . Type 2 diabetes mellitus with hyperglycemia (HCC) 06/06/2020  . Hyperkalemia  06/06/2020  . Thrombocytopenia (HCC) 06/06/2020  . Obesity (BMI 30.0-34.9) 06/06/2020  . Non-ST elevation (NSTEMI) myocardial infarction (HCC)   . Attention deficit hyperactivity disorder (ADHD), combined type, moderate 03/10/2018  . Generalized anxiety disorder 03/10/2018  . Schizoaffective disorder, bipolar type without good prognostic features (HCC) 02/09/2015  . Morbid obesity (HCC) 01/18/2015  . OSA (obstructive sleep apnea) 07/14/2014  . Solitary pulmonary nodule 07/14/2014    Past Surgical History:  Procedure Laterality Date  . APPENDECTOMY    . BRAIN SURGERY    . CARDIAC CATHETERIZATION    . CORONARY STENT INTERVENTION N/A 06/06/2020   Procedure: CORONARY STENT INTERVENTION;  Surgeon: SwazilandJordan, Peter M, MD;  [facility];  Service: Cardiovascular;  Laterality: N/A;  prox LAD, distal RCA  . INTRAVASCULAR ULTRASOUND/IVUS N/A 06/06/2020   Procedure: Intravascular Ultrasound/IVUS;  Surgeon: [place]Jordan, Peter M, MD;  [facility];  Service: Cardiovascular;  Laterality: N/A;  . LEFT HEART CATH AND CORONARY ANGIOGRAPHY N/A 06/06/2020   Procedure: LEFT HEART CATH AND CORONARY ANGIOGRAPHY;  Surgeon: [place]Jordan, Peter M, MD;  [facility];  Service: Cardiovascular;  Laterality: N/A;  . NASAL SEPTUM SURGERY    . WRIST FRACTURE SURGERY     right wrist       Family History  Problem Relation Age of Onset  . Asthma Mother   . Hypertension Mother   . Hypertension Father  Social History   Tobacco Use  . Smoking status: Heavy Tobacco Smoker    Packs/day: 2.00    Types: Cigarettes  . Smokeless tobacco: Never Used  . Tobacco comment: patient given phone number to 1-800-quit now on 08/16/20  Vaping Use  . Vaping Use: Never used  Substance Use Topics  . Alcohol use: No    Alcohol/week: 0.0 standard drinks    Comment: no (02/08/15)  . Drug use: No    Home Medications Prior to Admission medications   Medication Sig Start Date End Date  Taking? Authorizing Provider  aspirin EC 81 MG tablet Take 1 tablet (81 mg total) by mouth daily. Swallow whole. 06/07/20   Meriam Sprague, MD  atorvastatin (LIPITOR) 40 MG tablet Take 1 tablet (40 mg total) by mouth daily. 06/22/20 09/20/20  Meriam Sprague, MD  carvedilol (COREG) 12.5 MG tablet Take 1 tablet (12.5 mg total) by mouth 2 (two) times daily with a meal. 08/31/20   Pemberton, Kathlynn Grate, MD  clonazePAM (KLONOPIN) 1 MG tablet Take 1 tablet (1 mg total) by mouth 3 (three) times daily as needed for anxiety. 07/28/20   Cherie Ouch, PA-C  DULoxetine (CYMBALTA) 60 MG capsule Take 1 capsule (60 mg total) by mouth 2 (two) times daily. 09/08/20   Cherie Ouch, PA-C  empagliflozin (JARDIANCE) 25 MG TABS tablet Take 1 tablet (25 mg total) by mouth daily before breakfast. 08/15/20   Shamleffer, Konrad Dolores, MD  gabapentin (NEURONTIN) 800 MG tablet TAKE 1 TABLET BY MOUTH THREE TIMES A DAY Patient taking differently: Take 800 mg by mouth 3 (three) times daily. 03/29/20   Chauncey Mann, MD  insulin degludec (TRESIBA FLEXTOUCH) 100 UNIT/ML FlexTouch Pen Inject 16 Units into the skin daily. 08/26/20   Shamleffer, Konrad Dolores, MD  Insulin Pen Needle 32G X 4 MM MISC 1 Device by Does not apply route daily. 08/15/20   Shamleffer, Konrad Dolores, MD  isosorbide mononitrate (IMDUR) 30 MG 24 hr tablet Take 0.5 tablets (15 mg total) by mouth daily. 08/10/20   Wynetta Fines, MD  lisinopril (ZESTRIL) 20 MG tablet Take 1 tablet (20 mg total) by mouth daily. 06/08/20   Dyann Kief, PA-C  lithium carbonate (LITHOBID) 300 MG CR tablet TAKE 2 TABLETS (600 MG TOTAL) BY MOUTH AT BEDTIME. 03/29/20   Chauncey Mann, MD  metFORMIN (GLUCOPHAGE) 1000 MG tablet Take 1 tablet (1,000 mg total) by mouth 2 (two) times daily with a meal. 08/15/20   Shamleffer, Konrad Dolores, MD  nitroGLYCERIN (NITROSTAT) 0.4 MG SL tablet Place 1 tablet (0.4 mg total) under the tongue every 5 (five) minutes as needed for  chest pain. 08/10/20 08/10/21  Filbert Schilder, NP  perphenazine (TRILAFON) 16 MG tablet Take 1 pill total 16 mg every morning and take 2 pills by mouth total 32 mg every bedtime Patient taking differently: Take 16-32 mg by mouth See admin instructions. Take 1 pill total 16 mg every morning by mouth, then take 2 pills by mouth total 32 mg at bedtime per patient 03/29/20   Chauncey Mann, MD  spironolactone (ALDACTONE) 25 MG tablet Take 0.5 tablets (12.5 mg total) by mouth daily. 07/20/20 07/15/21  Meriam Sprague, MD  ticagrelor (BRILINTA) 90 MG TABS tablet Take 1 tablet (90 mg total) by mouth 2 (two) times daily. 06/07/20   Meriam Sprague, MD  traZODone (DESYREL) 100 MG tablet TAKE 3 TABLETS BY MOUTH EVERY DAY AT BEDTIME AS NEEDED FOR SLEEP Patient  taking differently: Take 100 mg by mouth at bedtime as needed for sleep. 09/08/20   Melony Overly T, PA-C    Allergies    Gluten meal  Review of Systems   Review of Systems  All other systems reviewed and are negative.   Physical Exam Updated Vital Signs BP (!) 146/105 (BP Location: Left Arm)   Pulse (!) 115   Temp 99 F (37.2 C) (Oral)   Resp 16   SpO2 90%   Physical Exam Vitals and nursing note reviewed.  Constitutional:      General: He is not in acute distress.    Appearance: He is well-developed.  HENT:     Head: Atraumatic.  Eyes:     Conjunctiva/sclera: Conjunctivae normal.  Cardiovascular:     Rate and Rhythm: Tachycardia present.     Heart sounds: Normal heart sounds.  Pulmonary:     Effort: Pulmonary effort is normal.     Breath sounds: Normal breath sounds. No wheezing, rhonchi or rales.  Abdominal:     Palpations: Abdomen is soft.     Tenderness: There is no abdominal tenderness.  Musculoskeletal:     Cervical back: Neck supple.  Skin:    Findings: No rash.  Neurological:     Mental Status: He is alert. Mental status is at baseline.  Psychiatric:        Mood and Affect: Mood normal.     ED Results  / Procedures / Treatments   Labs (all labs ordered are listed, but only abnormal results are displayed) Labs Reviewed  BASIC METABOLIC PANEL - Abnormal; Notable for the following components:      Result Value   Sodium 131 (*)    CO2 20 (*)    Glucose, Bld 140 (*)    All other components within normal limits  CBC - Abnormal; Notable for the following components:   WBC 17.5 (*)    All other components within normal limits  TROPONIN I (HIGH SENSITIVITY)  TROPONIN I (HIGH SENSITIVITY)    EKG None  ED ECG REPORT   Date: 09/12/2020  Rate: 121  Rhythm: sinus tachycardia  QRS Axis: right  Intervals: PR prolonged  ST/T Wave abnormalities: nonspecific ST changes  Conduction Disutrbances:first-degree A-V block   Narrative Interpretation:   Old EKG Reviewed: unchanged  I have personally reviewed the EKG tracing and agree with the computerized printout as noted.   Radiology DG Chest 2 View  Result Date: 09/12/2020 CLINICAL DATA:  Right-sided chest pain for 4 days. Nausea and vomiting. EXAM: CHEST - 2 VIEW COMPARISON:  09/11/2020 FINDINGS: The cardiomediastinal silhouette is within normal limits. The lungs are well inflated and clear. There is no evidence of pleural effusion or pneumothorax. No acute osseous abnormality is identified. IMPRESSION: No active cardiopulmonary disease. Electronically Signed   By: Sebastian Ache M.D.   On: 09/12/2020 14:40   DG Chest 2 View  Result Date: 09/11/2020 CLINICAL DATA:  Chest pain. EXAM: CHEST - 2 VIEW COMPARISON:  September 09, 2020 FINDINGS: The heart size and mediastinal contours are within normal limits. Both lungs are clear. The visualized skeletal structures are unremarkable. IMPRESSION: No active cardiopulmonary disease. Electronically Signed   By: Gerome Sam III M.D   On: 09/11/2020 13:11   CT Abdomen Pelvis W Contrast  Result Date: 09/11/2020 CLINICAL DATA:  Umbilical pain X 3 days, chest pain, nausea, vomiting EXAM: CT ABDOMEN AND PELVIS  WITH CONTRAST TECHNIQUE: Multidetector CT imaging of the abdomen and pelvis was performed  using the standard protocol following bolus administration of intravenous contrast. CONTRAST:  OMNIPAQUE IOHEXOL 300 MG/ML  SOLN COMPARISON:  08/17/2016 FINDINGS: Lower chest: Coronary calcifications. No pleural or pericardial effusion. Hepatobiliary: Fatty liver. No focal lesion. No biliary ductal dilatation. Gallbladder decompressed. Pancreas: Unremarkable. No pancreatic ductal dilatation or surrounding inflammatory changes. Spleen: Normal in size without focal abnormality. Adrenals/Urinary Tract: Normal adrenal glands. 1.8 cm probable cyst from the upper pole left kidney. No urolithiasis or hydronephrosis. Urinary bladder is physiologically distended. Stomach/Bowel: Stomach incompletely distended. Small bowel decompressed. Appendix surgically absent. The colon is nondilated, unremarkable. Vascular/Lymphatic: No significant vascular findings are present. No enlarged abdominal or pelvic lymph nodes. Reproductive: Mild prostate enlargement with central coarse calcification. Other: Bilateral pelvic phleboliths.  No ascites.  No free air. Musculoskeletal: Chronic degenerative disc disease L3-4, L4-5. 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 No acute fracture or worrisome bone lesion. IMPRESSION: 1. No acute findings. 2. Fatty liver. 3. Coronary calcifications. The severity of coronary artery disease and any potential stenosis cannot be assessed on this non-gated CT examination. Electronically Signed   By: Corlis Leak M.D.   On: 09/11/2020 14:55    Procedures Procedures   Medications Ordered in ED Medications  morphine 4 MG/ML injection 4 mg (4 mg Intravenous Given 09/12/20 1541)    ED Course  I have reviewed the triage vital signs and the nursing notes.  Pertinent labs & imaging results that were available during my care of the patient were reviewed by me and considered in my medical decision making  (see chart for details).    MDM Rules/Calculators/A&P                          BP 138/90   Pulse (!) 101   Temp 99 F (37.2 C) (Oral)   Resp 20   Ht 6\' 2"  (1.88 m)   Wt 122.5 kg   SpO2 95%   BMI 34.67 kg/m   Final Clinical Impression(s) / ED Diagnoses Final diagnoses:  Atypical chest pain    Rx / DC Orders ED Discharge Orders    None     3:28 PM Patient with history of cardiac disease and prior stenting who was seen in the ED yesterday for chest pain.  He was admitted for cardiac stress test but left AMA.  He went to his outpatient cardiac rehab for stress test this morning but still endorsed chest pain and thus was sent to the ER for further evaluation.  Patient is requesting for medication help with his pain.  He report he left AMA  due to his overwhelming anxiety but he would like to have cardiac stress test done during this visit.  3:56 PM EKG unchanged, troponin negative.  Troponin is 17.5 but he has had leukocytosis in the past and this has been a steady rise.  This could be related to his celiac disease and can be managed outpatient.  No productive cough and no COVID symptoms.  No other infectious symptoms at this time.  I have reached out to cardiology and request for the evaluation and recommendation.  Care discussed with Dr. .   3:59 PM Appreciate consultation from cardiologist Dr. Rodena Medin who have reviewed the patient EKG and chart and felt the patient can follow-up outpatient for cardiac stress test.  He will have the office call and schedule appointment for patient.     Caro Hight, PA-C 09/12/20 1616    09/14/20, MD 09/16/20  0947  

## 2020-09-12 NOTE — Progress Notes (Signed)
Was paged by RN at [phone] that patient wanted to leave AMA.  I went to bedside and asked the patient why he wanted to leave.  Continue states that he was tired and unable to sleep the hospital and he was also hungry and did not want to be kept NPO.  He also said that he was not in the right "mindset" to have a stress test done.  He endorsed that stress test and thinking about getting done gave him much anxiety.  Also noted that he was worried about being able to afford the stress test.  Discussed that the recurrent chest pain is the reasoning for the stress test wanting to be potentially done.  Discussed that not having it done puts him at risk for recurrent episodes of chest pain and having to come to the ED more frequently.  He knowledges understanding this.  He also knowledges understanding that if he leaves AMA this could result in another heart attack, possibly stroke or even death.  He acknowledges these risks.  I do believe he has capacity to make this decision to leave against medical advice, he has intact insight and judgment.

## 2020-09-12 NOTE — ED Triage Notes (Signed)
Emergency Medicine Provider Triage Evaluation Note  Matthew [name] , a 45 y.o. male  was evaluated in triage.  Pt complains of right-sided chest pain that started again this morning. Patient admitted to the hospital last night for same complaint; however left AMA because he notes he was feeling anxious. He has been worked up numerous times for chest pain with reassuring results. History of NSTEMI. Right-sided chest pain associated with nausea and vomiting. Pain radiates to back.   Review of Systems  Positive: Chest pain, nausea, vomiting  Physical Exam  BP (!) 146/105 (BP Location: Left Arm)   Pulse (!) 115   Temp 99 F (37.2 C) (Oral)   Resp 16   SpO2 90%  Gen:   Awake, no distress   HEENT:  Atraumatic  Resp:  Normal effort  Cardiac:  tachycardic Abd:   Nondistended, nontender  MSK:   Moves extremities without difficulty  Neuro:  Speech clear   Medical Decision Making  Medically screening exam initiated at 1:46 PM.  Appropriate orders placed.  Matthew [name] was informed that the remainder of the evaluation will be completed by another provider, this initial triage assessment does not replace that evaluation, and the importance of remaining in the ED until their evaluation is complete.  Clinical Impression  CP history of NSTEMI. Left AMA this AM for same complaint. Cardiac labs ordered.   Mannie Stabile, [place] 09/12/20 1348

## 2020-09-12 NOTE — Progress Notes (Signed)
Cardiac Individual Treatment Plan  Patient Details  Name: Matthew [name] MRN: [id number] Date of Birth: Mar 09, 1976 Referring Provider:   Flowsheet Row CARDIAC REHAB PHASE II ORIENTATION from 08/16/2020 in [facility] CARDIAC REHAB  Referring Provider Laurance Flatten, MD      Initial Encounter Date:  Flowsheet Row CARDIAC REHAB PHASE II ORIENTATION from 08/16/2020 in [facility] CARDIAC REHAB  Date 08/16/20      Visit Diagnosis: 06/06/20 NSTEMI  06/06/20 S/P DES RCA, LAD  Patient's Home Medications on Admission:  Current Outpatient Medications:  .  aspirin EC 81 MG tablet, Take 1 tablet (81 mg total) by mouth daily. Swallow whole., Disp: 90 tablet, Rfl: 3 .  atorvastatin (LIPITOR) 40 MG tablet, Take 1 tablet (40 mg total) by mouth daily., Disp: 90 tablet, Rfl: 3 .  carvedilol (COREG) 12.5 MG tablet, Take 1 tablet (12.5 mg total) by mouth 2 (two) times daily with a meal., Disp: 180 tablet, Rfl: 1 .  clonazePAM (KLONOPIN) 1 MG tablet, Take 1 tablet (1 mg total) by mouth 3 (three) times daily as needed for anxiety., Disp: 90 tablet, Rfl: 1 .  DULoxetine (CYMBALTA) 60 MG capsule, Take 1 capsule (60 mg total) by mouth 2 (two) times daily., Disp: 180 capsule, Rfl: 0 .  empagliflozin (JARDIANCE) 25 MG TABS tablet, Take 1 tablet (25 mg total) by mouth daily before breakfast., Disp: 90 tablet, Rfl: 1 .  gabapentin (NEURONTIN) 800 MG tablet, TAKE 1 TABLET BY MOUTH THREE TIMES A DAY (Patient taking differently: Take 800 mg by mouth 3 (three) times daily.), Disp: 270 tablet, Rfl: 1 .  insulin degludec (TRESIBA FLEXTOUCH) 100 UNIT/ML FlexTouch Pen, Inject 16 Units into the skin daily., Disp: 15 mL, Rfl: 11 .  Insulin Pen Needle 32G X 4 MM MISC, 1 Device by Does not apply route daily., Disp: 150 each, Rfl: 3 .  isosorbide mononitrate (IMDUR) 30 MG 24 hr tablet, Take 0.5 tablets (15 mg total) by mouth daily., Disp: 30 tablet, Rfl: 0 .  lisinopril (ZESTRIL) 20 MG  tablet, Take 1 tablet (20 mg total) by mouth daily., Disp: 90 tablet, Rfl: 3 .  lithium carbonate (LITHOBID) 300 MG CR tablet, TAKE 2 TABLETS (600 MG TOTAL) BY MOUTH AT BEDTIME., Disp: 180 tablet, Rfl: 1 .  metFORMIN (GLUCOPHAGE) 1000 MG tablet, Take 1 tablet (1,000 mg total) by mouth 2 (two) times daily with a meal., Disp: 180 tablet, Rfl: 3 .  nitroGLYCERIN (NITROSTAT) 0.4 MG SL tablet, Place 1 tablet (0.4 mg total) under the tongue every 5 (five) minutes as needed for chest pain., Disp: 100 tablet, Rfl: 3 .  perphenazine (TRILAFON) 16 MG tablet, Take 1 pill total 16 mg every morning and take 2 pills by mouth total 32 mg every bedtime (Patient taking differently: Take 16-32 mg by mouth See admin instructions. Take 1 pill total 16 mg every morning by mouth, then take 2 pills by mouth total 32 mg at bedtime per patient), Disp: 270 tablet, Rfl: 1 .  spironolactone (ALDACTONE) 25 MG tablet, Take 0.5 tablets (12.5 mg total) by mouth daily., Disp: 45 tablet, Rfl: 3 .  ticagrelor (BRILINTA) 90 MG TABS tablet, Take 1 tablet (90 mg total) by mouth 2 (two) times daily., Disp: 60 tablet, Rfl: 11 .  traZODone (DESYREL) 100 MG tablet, TAKE 3 TABLETS BY MOUTH EVERY DAY AT BEDTIME AS NEEDED FOR SLEEP (Patient taking differently: Take 100 mg by mouth at bedtime as needed for sleep.), Disp: 270 tablet, Rfl:  0  Past Medical History: Past Medical History:  Diagnosis Date  . Anxiety   . Celiac disease   . Coronary artery disease   . Dairy allergy   . Depression   . Diabetes mellitus without complication (HCC)   . High cholesterol   . Hypertension   . Paranoia (HCC)   . Pre-diabetes   . Schizoaffective disorder (HCC)     Tobacco Use: Social History   Tobacco Use  Smoking Status Heavy Tobacco Smoker  . Packs/day: 2.00  . Types: Cigarettes  Smokeless Tobacco Never Used  Tobacco Comment   patient given phone number to 1-800-quit now on 08/16/20    Labs: Recent Review Flowsheet Data    Labs for ITP  Cardiac and Pulmonary Rehab Latest Ref Rng & Units 02/10/2015 10/21/2018 06/06/2020 08/15/2020   Cholestrol 0 - 200 mg/dL 045(W) 098(J) 191(Y) 782   LDLCALC 0 - 99 mg/dL 956(O) UNABLE TO CALCULATE IF TRIGLYCERIDE OVER 400 mg/dL UNABLE TO CALCULATE IF TRIGLYCERIDE OVER 400 mg/dL -   LDLDIRECT mg/dL - 130.3(H) 51.0 68.0   HDL >39.00 mg/dL 46 86(V) NOT REPORTED DUE TO HIGH TRIGLYCERIDES 23.50(L)   Trlycerides 0.0 - 149.0 mg/dL 784(O) 962(X) 5,284(X) 377.0(H)   Hemoglobin A1c 4.0 - 5.6 % 6.4(H) 6.4(H) 12.8(H) 12.3(A)   PHART 7.350 - 7.450 - - 7.356 -   PCO2ART 32.0 - 48.0 mmHg - - 40.4 -   HCO3 20.0 - 28.0 mmol/L - - 22.6 -   TCO2 22 - 32 mmol/L - - 24 -   ACIDBASEDEF 0.0 - 2.0 mmol/L - - 3.0(H) -   O2SAT % - - 96.0 -      Capillary Blood Glucose: Lab Results  Component Value Date   GLUCAP 152 (H) 09/12/2020   GLUCAP 124 (H) 09/11/2020   GLUCAP 187 (H) 09/09/2020   GLUCAP 325 (H) 09/05/2020   GLUCAP 145 (H) 09/02/2020     Exercise Target Goals: Exercise Program Goal: Individual exercise prescription set using results from initial 6 min walk test and THRR while considering  patient's activity barriers and safety.   Exercise Prescription Goal: Starting with aerobic activity 30 plus minutes a day, 3 days per week for initial exercise prescription. Provide home exercise prescription and guidelines that participant acknowledges understanding prior to discharge.  Activity Barriers & Risk Stratification:  Activity Barriers & Cardiac Risk Stratification - 08/16/20 1222      Activity Barriers & Cardiac Risk Stratification   Activity Barriers Back Problems;Joint Problems;Chest Pain/Angina;Other (comment)    Comments Schizoaffective disorder    Cardiac Risk Stratification High           6 Minute Walk:  6 Minute Walk    Row Name 08/16/20 1024         6 Minute Walk   Phase Initial     Distance 1437 feet     Walk Time 0 minutes     # of Rest Breaks 0     MPH 2.72     METS 4.54      RPE 9     Perceived Dyspnea  0     VO2 Peak 15.9     Symptoms Yes (comment)     Comments Low back pain 2-3/10, chronic     Resting HR 122 bpm     Resting BP 98/72     Resting Oxygen Saturation  99 %     Exercise Oxygen Saturation  during 6 min walk 96 %     Max  Ex. HR 134 bpm     Max Ex. BP 114/79     2 Minute Post BP 108/70            Oxygen Initial Assessment:   Oxygen Re-Evaluation:   Oxygen Discharge (Final Oxygen Re-Evaluation):   Initial Exercise Prescription:  Initial Exercise Prescription - 08/16/20 1200      Date of Initial Exercise RX and Referring Provider   Date 08/16/20    Referring Provider Laurance Flatten, MD    Expected Discharge Date 10/14/20      T5 Nustep   Level 3    SPM 75    Minutes 30    METs 2.2      Prescription Details   Frequency (times per week) 3    Duration Progress to 30 minutes of continuous aerobic without signs/symptoms of physical distress      Intensity   THRR 40-80% of Max Heartrate 71-142    Ratings of Perceived Exertion 11-13    Perceived Dyspnea 0-4      Progression   Progression Continue progressive overload as per policy without signs/symptoms or physical distress.      Resistance Training   Training Prescription Yes    Weight 5 lbs    Reps 10-15           Perform Capillary Blood Glucose checks as needed.  Exercise Prescription Changes:   Exercise Prescription Changes    Row Name 08/22/20 1430 09/07/20 1432           Response to Exercise   Blood Pressure (Admit) 110/74 118/78      Blood Pressure (Exercise) 128/78 134/68      Blood Pressure (Exit) 114/84 104/82      Heart Rate (Admit) 111 bpm 98 bpm      Heart Rate (Exercise) 121 bpm 127 bpm      Heart Rate (Exit) 103 bpm 109 bpm      Rating of Perceived Exertion (Exercise) 13 12      Symptoms None None      Comments Pt's first day in the CRP2 program Reviewed METs and home exercise Rx      Duration Progress to 30 minutes of  aerobic without  signs/symptoms of physical distress Continue with 30 min of aerobic exercise without signs/symptoms of physical distress.      Intensity THRR unchanged THRR unchanged             Progression   Progression Continue to progress workloads to maintain intensity without signs/symptoms of physical distress. Continue to progress workloads to maintain intensity without signs/symptoms of physical distress.      Average METs 1.9 2.2             Resistance Training   Training Prescription Yes No      Weight 5 lbs No weights on Wednesdays      Reps 10-15 --      Time 10 Minutes --             Interval Training   Interval Training No No             T5 Nustep   Level 3 3      SPM 75 85      Minutes 30 30      METs 1.9 2.2             Home Exercise Plan   Plans to continue exercise at -- Home (comment)      Frequency --  Add 2 additional days to program exercise sessions.      Initial Home Exercises Provided -- 09/07/20             Exercise Comments:   Exercise Comments    Row Name 08/22/20 1430 09/07/20 1436         Exercise Comments Pt's first day in the CRP2 program. Pt tolerated session well with no complaints. Reviewed METs and home exercise Rx. Pt is progressing slowly. Pt verbalized understanding of the home exercise Rx and was provided a copy.             Exercise Goals and Review:   Exercise Goals    Row Name 08/16/20 1226             Exercise Goals   Increase Physical Activity Yes       Intervention Provide advice, education, support and counseling about physical activity/exercise needs.;Develop an individualized exercise prescription for aerobic and resistive training based on initial evaluation findings, risk stratification, comorbidities and participant's personal goals.       Expected Outcomes Short Term: Attend rehab on a regular basis to increase amount of physical activity.;Long Term: Add in home exercise to make exercise part of routine and to increase  amount of physical activity.;Long Term: Exercising regularly at least 3-5 days a week.       Increase Strength and Stamina Yes       Intervention Provide advice, education, support and counseling about physical activity/exercise needs.;Develop an individualized exercise prescription for aerobic and resistive training based on initial evaluation findings, risk stratification, comorbidities and participant's personal goals.       Expected Outcomes Short Term: Increase workloads from initial exercise prescription for resistance, speed, and METs.;Short Term: Perform resistance training exercises routinely during rehab and add in resistance training at home;Long Term: Improve cardiorespiratory fitness, muscular endurance and strength as measured by increased METs and functional capacity ( )       Able to understand and use rate of perceived exertion (RPE) scale Yes       Intervention Provide education and explanation on how to use RPE scale       Expected Outcomes Short Term: Able to use RPE daily in rehab to express subjective intensity level;Long Term:  Able to use RPE to guide intensity level when exercising independently       Knowledge and understanding of Target Heart Rate Range (THRR) Yes       Intervention Provide education and explanation of THRR including how the numbers were predicted and where they are located for reference       Expected Outcomes Short Term: Able to state/look up THRR;Short Term: Able to use daily as guideline for intensity in rehab;Long Term: Able to use THRR to govern intensity when exercising independently       Understanding of Exercise Prescription Yes       Intervention Provide education, explanation, and written materials on patient's individual exercise prescription       Expected Outcomes Short Term: Able to explain program exercise prescription;Long Term: Able to explain home exercise prescription to exercise independently              Exercise Goals  Re-Evaluation :  Exercise Goals Re-Evaluation    Row Name 08/22/20 1430 09/07/20 1434           Exercise Goal Re-Evaluation   Exercise Goals Review Increase Physical Activity;Increase Strength and Stamina;Able to understand and use rate of perceived exertion (RPE) scale;Knowledge and  understanding of Target Heart Rate Range (THRR);Understanding of Exercise Prescription Increase Physical Activity;Increase Strength and Stamina;Able to understand and use rate of perceived exertion (RPE) scale;Knowledge and understanding of Target Heart Rate Range (THRR);Able to check pulse independently;Understanding of Exercise Prescription      Comments Pt's first day in the CRP2 program. Pt understands the Exercise Rx, THRR, and RPE scale. Reviewed METs and Home exercise Rx. Pt encouraged to begin walking 2-3x/week for 30 minutes.      Expected Outcomes Will continue to monitor patient and progress exercise workloads as tolerated. Pt will begin to walk at home 2-3x/week for 30 minutes.              Discharge Exercise Prescription (Final Exercise Prescription Changes):  Exercise Prescription Changes - 09/07/20 1432      Response to Exercise   Blood Pressure (Admit) 118/78    Blood Pressure (Exercise) 134/68    Blood Pressure (Exit) 104/82    Heart Rate (Admit) 98 bpm    Heart Rate (Exercise) 127 bpm    Heart Rate (Exit) 109 bpm    Rating of Perceived Exertion (Exercise) 12    Symptoms None    Comments Reviewed METs and home exercise Rx    Duration Continue with 30 min of aerobic exercise without signs/symptoms of physical distress.    Intensity THRR unchanged      Progression   Progression Continue to progress workloads to maintain intensity without signs/symptoms of physical distress.    Average METs 2.2      Resistance Training   Training Prescription No    Weight No weights on Wednesdays      Interval Training   Interval Training No      T5 Nustep   Level 3    SPM 85    Minutes 30     METs 2.2      Home Exercise Plan   Plans to continue exercise at Home (comment)    Frequency Add 2 additional days to program exercise sessions.    Initial Home Exercises Provided 09/07/20           Nutrition:  Target Goals: Understanding of nutrition guidelines, daily intake of sodium 1500mg , cholesterol 200mg , calories 30% from fat and 7% or less from saturated fats, daily to have 5 or more servings of fruits and vegetables.  Biometrics:  Pre Biometrics - 08/16/20 0930      Pre Biometrics   Waist Circumference 48.5 inches    Hip Circumference 43.5 inches    Waist to Hip Ratio 1.11 %    Triceps Skinfold 24 mm    % Body Fat 34 %    Grip Strength 40 kg    Flexibility 11.25 in    Single Leg Stand 30 seconds            Nutrition Therapy Plan and Nutrition Goals:  Nutrition Therapy & Goals - 09/06/20 1145      Nutrition Therapy   Diet TLC; carb modified    Drug/Food Interactions Statins/Certain Fruits      Personal Nutrition Goals   Nutrition Goal Pt to build a healthy plate including vegetables, fruits, whole grains, and low-fat dairy products in a heart healthy meal plan.    Personal Goal #2 CBG concentrations in the normal range or as close to normal as is safely possible.    Personal Goal #3 Improved blood glucose control as evidenced by pt's A1c trending from 12.3 toward less than 7.0.  Intervention Plan   Intervention Prescribe, educate and counsel regarding individualized specific dietary modifications aiming towards targeted core components such as weight, hypertension, lipid management, diabetes, heart failure and other comorbidities.;Nutrition handout(s) given to patient.    Expected Outcomes Short Term Goal: A plan has been developed with personal nutrition goals set during dietitian appointment.;Long Term Goal: Adherence to prescribed nutrition plan.           Nutrition Assessments:  MEDIFICTS Score Key:  ?70 Need to make dietary changes   40-70  Heart Healthy Diet  ? 40 Therapeutic Level Cholesterol Diet   Picture Your Plate Scores:  <16 Unhealthy dietary pattern with much room for improvement.  41-50 Dietary pattern unlikely to meet recommendations for good health and room for improvement.  51-60 More healthful dietary pattern, with some room for improvement.   >60 Healthy dietary pattern, although there may be some specific behaviors that could be improved.    Nutrition Goals Re-Evaluation:  Nutrition Goals Re-Evaluation    Row Name 09/06/20 1146 09/09/20 1504           Goals   Current Weight 255 lb (115.7 kg) 259 lb 7.7 oz (117.7 kg)      Nutrition Goal Pt to build a healthy plate including vegetables, fruits, whole grains, and low-fat dairy products in a heart healthy meal plan. Pt to build a healthy plate including vegetables, fruits, whole grains, and low-fat dairy products in a heart healthy meal plan.             Personal Goal #2 Re-Evaluation   Personal Goal #2 CBG concentrations in the normal range or as close to normal as is safely possible. CBG concentrations in the normal range or as close to normal as is safely possible.             Personal Goal #3 Re-Evaluation   Personal Goal #3 Improved blood glucose control as evidenced by pt's A1c trending from 12.3 toward less than 7.0. Improved blood glucose control as evidenced by pt's A1c trending from 12.3 toward less than 7.0.             Nutrition Goals Discharge (Final Nutrition Goals Re-Evaluation):  Nutrition Goals Re-Evaluation - 09/09/20 1504      Goals   Current Weight 259 lb 7.7 oz (117.7 kg)    Nutrition Goal Pt to build a healthy plate including vegetables, fruits, whole grains, and low-fat dairy products in a heart healthy meal plan.      Personal Goal #2 Re-Evaluation   Personal Goal #2 CBG concentrations in the normal range or as close to normal as is safely possible.      Personal Goal #3 Re-Evaluation   Personal Goal #3 Improved blood  glucose control as evidenced by pt's A1c trending from 12.3 toward less than 7.0.           Psychosocial: Target Goals: Acknowledge presence or absence of significant depression and/or stress, maximize coping skills, provide positive support system. Participant is able to verbalize types and ability to use techniques and skills needed for reducing stress and depression.  Initial Review & Psychosocial Screening:  Initial Psych Review & Screening - 08/16/20 1148      Initial Review   Current issues with History of Depression;Current Stress Concerns;Current Psychotropic Meds;Current Anxiety/Panic    Source of Stress Concerns Chronic Illness;Unable to participate in former interests or hobbies;Family    Comments Matthew [name] has Schizoaffective disorder, ADHD and is worriend about his mother who recently  had a stroke. Matthew [name] says he recently as been under a lot of stress      Family Dynamics   Good Support System? Yes   Matthew [name] lives alone. Matthew [name] says he has his mother, father, brother and sister for support     Barriers   Psychosocial barriers to participate in program The patient should benefit from training in stress management and relaxation.      Screening Interventions   Interventions Encouraged to exercise;Provide feedback about the scores to participant;To provide support and resources with identified psychosocial needs    Expected Outcomes Short Term goal: Utilizing psychosocial counselor, staff and physician to assist with identification of specific Stressors or current issues interfering with healing process. Setting desired goal for each stressor or current issue identified.;Long Term Goal: Stressors or current issues are controlled or eliminated.;Short Term goal: Identification and review with participant of any Quality of Life or Depression concerns found by scoring the questionnaire.;Long Term goal: The participant improves quality of Life and PHQ9 Scores as seen by post scores and/or  verbalization of changes           Quality of Life Scores:  Quality of Life - 08/16/20 1129      Quality of Life   Select Quality of Life      Quality of Life Scores   Health/Function Pre 10.5 %    Socioeconomic Pre 13.69 %    Psych/Spiritual Pre 15.14 %    Family Pre 12.9 %    GLOBAL Pre 12.5 %          Scores of 19 and below usually indicate a poorer quality of life in these areas.  A difference of  2-3 points is a clinically meaningful difference.  A difference of 2-3 points in the total score of the Quality of Life Index has been associated with significant improvement in overall quality of life, self-image, physical symptoms, and general health in studies assessing change in quality of life.  PHQ-9: Recent Review Flowsheet Data    Depression screen [facility] 08/16/2020   Decreased Interest 0   Down, Depressed, Hopeless 0   PHQ - 2 Score 0     Interpretation of Total Score  Total Score Depression Severity:  1-4 = Minimal depression, 5-9 = Mild depression, 10-14 = Moderate depression, 15-19 = Moderately severe depression, 20-27 = Severe depression   Psychosocial Evaluation and Intervention:   Psychosocial Re-Evaluation:  Psychosocial Re-Evaluation    Row Name 09/12/20 1000             Psychosocial Re-Evaluation   Current issues with Current Anxiety/Panic;Current Stress Concerns;Current Psychotropic Meds;Current Depression;Current Sleep Concerns;History of Depression       Comments Matthew [name] has not voiced having any increased depression or stressors. Cardiac rehab appears to have been helpful for Matthew [name]. Matthew [name] is under the care of a psychiatrist and recevies additonal home services for his schizoaffective disorder.       Expected Outcomes Patient will have decreased depression and anxiety upon completion of phase 2 cardiac rehab       Interventions Encouraged to attend Cardiac Rehabilitation for the exercise       Continue Psychosocial Services  Follow up required by staff        Comments Will continue to monitor and offer support as needed               Initial Review   Source of Stress Concerns Chronic Illness;Unable to perform yard/household activities;Retirement/disability;Unable to participate in former  interests or hobbies              Psychosocial Discharge (Final Psychosocial Re-Evaluation):  Psychosocial Re-Evaluation - 09/12/20 1000      Psychosocial Re-Evaluation   Current issues with Current Anxiety/Panic;Current Stress Concerns;Current Psychotropic Meds;Current Depression;Current Sleep Concerns;History of Depression    Comments Matthew [name] has not voiced having any increased depression or stressors. Cardiac rehab appears to have been helpful for Matthew [name]. Matthew [name] is under the care of a psychiatrist and recevies additonal home services for his schizoaffective disorder.    Expected Outcomes Patient will have decreased depression and anxiety upon completion of phase 2 cardiac rehab    Interventions Encouraged to attend Cardiac Rehabilitation for the exercise    Continue Psychosocial Services  Follow up required by staff    Comments Will continue to monitor and offer support as needed      Initial Review   Source of Stress Concerns Chronic Illness;Unable to perform yard/household activities;Retirement/disability;Unable to participate in former interests or hobbies           Vocational Rehabilitation: Provide vocational rehab assistance to qualifying candidates.   Vocational Rehab Evaluation & Intervention:  Vocational Rehab - 08/16/20 1338      Initial Vocational Rehab Evaluation & Intervention   Assessment shows need for Vocational Rehabilitation No   Matthew [name] is currently disabled and does not need vocational rehab          Education: Education Goals: Education classes will be provided on a weekly basis, covering required topics. Participant will state understanding/return demonstration of topics presented.  Learning Barriers/Preferences:   Learning Barriers/Preferences - 08/16/20 1130      Learning Barriers/Preferences   Learning Barriers None    Learning Preferences Group Instruction;Individual Instruction;Skilled Demonstration           Education Topics: Hypertension, Hypertension Reduction -Define heart disease and high blood pressure. Discus how high blood pressure affects the body and ways to reduce high blood pressure.   Exercise and Your Heart -Discuss why it is important to exercise, the FITT principles of exercise, normal and abnormal responses to exercise, and how to exercise safely.   Angina -Discuss definition of angina, causes of angina, treatment of angina, and how to decrease risk of having angina.   Cardiac Medications -Review what the following cardiac medications are used for, how they affect the body, and side effects that may occur when taking the medications.  Medications include Aspirin, Beta blockers, calcium channel blockers, ACE Inhibitors, angiotensin receptor blockers, diuretics, digoxin, and antihyperlipidemics.   Congestive Heart Failure -Discuss the definition of CHF, how to live with CHF, the signs and symptoms of CHF, and how keep track of weight and sodium intake.   Heart Disease and Intimacy -Discus the effect sexual activity has on the heart, how changes occur during intimacy as we age, and safety during sexual activity.   Smoking Cessation / COPD -Discuss different methods to quit smoking, the health benefits of quitting smoking, and the definition of COPD.   Nutrition I: Fats -Discuss the types of cholesterol, what cholesterol does to the heart, and how cholesterol levels can be controlled.   Nutrition II: Labels -Discuss the different components of food labels and how to read food label   Heart Parts/Heart Disease and PAD -Discuss the anatomy of the heart, the pathway of blood circulation through the heart, and these are affected by heart disease.   Stress I:  Signs and Symptoms -Discuss the causes of stress, how stress may lead to  anxiety and depression, and ways to limit stress.   Stress II: Relaxation -Discuss different types of relaxation techniques to limit stress.   Warning Signs of Stroke / TIA -Discuss definition of a stroke, what the signs and symptoms are of a stroke, and how to identify when someone is having stroke.   Knowledge Questionnaire Score:  Knowledge Questionnaire Score - 08/16/20 1217      Knowledge Questionnaire Score   Pre Score 25/28           Core Components/Risk Factors/Patient Goals at Admission:  Personal Goals and Risk Factors at Admission - 08/16/20 1218      Core Components/Risk Factors/Patient Goals on Admission    Weight Management Yes;Obesity;Weight Loss    Intervention Weight Management: Develop a combined nutrition and exercise program designed to reach desired caloric intake, while maintaining appropriate intake of nutrient and fiber, sodium and fats, and appropriate energy expenditure required for the weight goal.;Weight Management: Provide education and appropriate resources to help participant work on and attain dietary goals.;Weight Management/Obesity: Establish reasonable short term and long term weight goals.;Obesity: Provide education and appropriate resources to help participant work on and attain dietary goals.    Admit Weight 248 lb 7.3 oz (112.7 kg)    Expected Outcomes Understanding of distribution of calorie intake throughout the day with the consumption of 4-5 meals/snacks;Understanding recommendations for meals to include 15-35% energy as protein, 25-35% energy from fat, 35-60% energy from carbohydrates, less than 200mg  of dietary cholesterol, 20-35 gm of total fiber daily;Weight Loss: Understanding of general recommendations for a balanced deficit meal plan, which promotes 1-2 lb weight loss per week and includes a negative energy balance of [phone] kcal/d;Weight Maintenance: Understanding  of the daily nutrition guidelines, which includes 25-35% calories from fat, 7% or less cal from saturated fats, less than 200mg  cholesterol, less than 1.5gm of sodium, & 5 or more servings of fruits and vegetables daily;Long Term: Adherence to nutrition and physical activity/exercise program aimed toward attainment of established weight goal;Short Term: Continue to assess and modify interventions until short term weight is achieved    Tobacco Cessation Yes    Number of packs per day 2    Intervention Assist the participant in steps to quit. Provide individualized education and counseling about committing to Tobacco Cessation, relapse prevention, and pharmacological support that can be provided by physician.; , assist with locating and accessing local/national Quit Smoking programs, and support quit date choice.    Expected Outcomes Short Term: Will demonstrate readiness to quit, by selecting a quit date.;Short Term: Will quit all tobacco product use, adhering to prevention of relapse plan.;Long Term: Complete abstinence from all tobacco products for at least 12 months from quit date.    Diabetes Yes    Intervention Provide education about signs/symptoms and action to take for hypo/hyperglycemia.;Provide education about proper nutrition, including hydration, and aerobic/resistive exercise prescription along with prescribed medications to achieve blood glucose in normal ranges: Fasting glucose 65-99 mg/dL    Expected Outcomes Short Term: Participant verbalizes understanding of the signs/symptoms and immediate care of hyper/hypoglycemia, proper foot care and importance of medication, aerobic/resistive exercise and nutrition plan for blood glucose control.;Long Term: Attainment of HbA1C < 7%.    Hypertension Yes    Intervention Provide education on lifestyle modifcations including regular physical activity/exercise, weight management, moderate sodium restriction and increased  consumption of fresh fruit, vegetables, and low fat dairy, alcohol moderation, and smoking cessation.;Monitor prescription use compliance.    Expected Outcomes Short Term: Continued  assessment and intervention until BP is < 140/1mm HG in hypertensive participants. < 130/57mm HG in hypertensive participants with diabetes, heart failure or chronic kidney disease.;Long Term: Maintenance of blood pressure at goal levels.    Lipids Yes    Intervention Provide education and support for participant on nutrition & aerobic/resistive exercise along with prescribed medications to achieve LDL 70mg , HDL >40mg .    Expected Outcomes Short Term: Participant states understanding of desired cholesterol values and is compliant with medications prescribed. Participant is following exercise prescription and nutrition guidelines.;Long Term: Cholesterol controlled with medications as prescribed, with individualized exercise RX and with personalized nutrition plan. Value goals: LDL < 70mg , HDL > 40 mg.    Stress Yes    Intervention Offer individual and/or small group education and counseling on adjustment to heart disease, stress management and health-related lifestyle change. Teach and support self-help strategies.;Refer participants experiencing significant psychosocial distress to appropriate mental health specialists for further evaluation and treatment. When possible, include family members and significant others in education/counseling sessions.    Expected Outcomes Short Term: Participant demonstrates changes in health-related behavior, relaxation and other stress management skills, ability to obtain effective social support, and compliance with psychotropic medications if prescribed.;Long Term: Emotional wellbeing is indicated by absence of clinically significant psychosocial distress or social isolation.           Core Components/Risk Factors/Patient Goals Review:   Goals and Risk Factor Review    Row Name  09/12/20 1017             Core Components/Risk Factors/Patient Goals Review   Personal Goals Review Weight Management/Obesity;Stress;Hypertension;Diabetes;Lipids;Tobacco Cessation       Review Matthew [name] has done well with exercise his heart rate remains elevated Dr Shari Prows is aware. Matthew [name] continues to smoke heavily Blood sugars remain varaible as he has had some high reqadings and not been able to exercise due to that reason. Nick's attendance has been fair he appears to enjoy participating in phase 2 cardiac rehab.       Expected Outcomes Nick's exercise at cardiac rehab is currently on hold as the patient left AMA on 09/12/20 after being admitted with chest pain              Core Components/Risk Factors/Patient Goals at Discharge (Final Review):   Goals and Risk Factor Review - 09/12/20 1017      Core Components/Risk Factors/Patient Goals Review   Personal Goals Review Weight Management/Obesity;Stress;Hypertension;Diabetes;Lipids;Tobacco Cessation    Review Matthew [name] has done well with exercise his heart rate remains elevated Dr Shari Prows is aware. Matthew [name] continues to smoke heavily Blood sugars remain varaible as he has had some high reqadings and not been able to exercise due to that reason. Nick's attendance has been fair he appears to enjoy participating in phase 2 cardiac rehab.    Expected Outcomes Nick's exercise at cardiac rehab is currently on hold as the patient left AMA on 09/12/20 after being admitted with chest pain           ITP Comments:  ITP Comments    Row Name 08/16/20 1147 09/12/20 0956         ITP Comments Dr Armanda Magic MD, Medical Director 30 Day ITP Review. Patient has good attendance and participation in phase 2 cardiac rehab. Matthew [name] went to the ED and was admitted to the hopital the weekend of 09/09/20. Matthew [name] left AMA on 09/12/20. Nick's cardiac rehab is on hold currently.  Comments: See ITP comments.Gladstone Lighter, RN,BSN 09/13/2020 8:19 AM

## 2020-09-12 NOTE — Discharge Summary (Signed)
PATIENT LEFT AMA   Name: Matthew [name] MRN: [id number] DOB: 04/09/1976 45 y.o. PCP: [name], MD  Date of Admission: 09/11/2020 12:31 PM Date of leaving AMA: 09/12/2020  Attending Physician: Antony Contras  Discharge Diagnosis: 1. Atypical chest pain 2. Abdominal pain with leukocytosis 3. Acute on chronic hyponatremia 4. Uncontrolled type 2 diabetes 5. Schizoaffective disorder, bipolar type  Discharge Medications: Allergies as of 09/12/2020      Reactions   Gluten Meal Other (See Comments)   Celiac disease      Medication List    TAKE these medications   aspirin EC 81 MG tablet Take 1 tablet (81 mg total) by mouth daily. Swallow whole.   atorvastatin 40 MG tablet Commonly known as: LIPITOR Take 1 tablet (40 mg total) by mouth daily.   carvedilol 12.5 MG tablet Commonly known as: COREG Take 1 tablet (12.5 mg total) by mouth 2 (two) times daily with a meal.   clonazePAM 1 MG tablet Commonly known as: KLONOPIN Take 1 tablet (1 mg total) by mouth 3 (three) times daily as needed for anxiety.   DULoxetine 60 MG capsule Commonly known as: CYMBALTA Take 1 capsule (60 mg total) by mouth 2 (two) times daily.   empagliflozin 25 MG Tabs tablet Commonly known as: Jardiance Take 1 tablet (25 mg total) by mouth daily before breakfast.   gabapentin 800 MG tablet Commonly known as: NEURONTIN TAKE 1 TABLET BY MOUTH THREE TIMES A DAY   Insulin Pen Needle 32G X 4 MM Misc 1 Device by Does not apply route daily.   isosorbide mononitrate 30 MG 24 hr tablet Commonly known as: IMDUR Take 0.5 tablets (15 mg total) by mouth daily.   lisinopril 20 MG tablet Commonly known as: ZESTRIL Take 1 tablet (20 mg total) by mouth daily.   lithium carbonate 300 MG CR tablet Commonly known as: LITHOBID TAKE 2 TABLETS (600 MG TOTAL) BY MOUTH AT BEDTIME.   metFORMIN 1000 MG tablet Commonly known as: GLUCOPHAGE Take 1 tablet (1,000 mg total) by mouth 2 (two) times daily with a meal.    nitroGLYCERIN 0.4 MG SL tablet Commonly known as: Nitrostat Place 1 tablet (0.4 mg total) under the tongue every 5 (five) minutes as needed for chest pain.   perphenazine 16 MG tablet Commonly known as: TRILAFON Take 1 pill total 16 mg every morning and take 2 pills by mouth total 32 mg every bedtime What changed:   how much to take  how to take this  when to take this  additional instructions   spironolactone 25 MG tablet Commonly known as: ALDACTONE Take 0.5 tablets (12.5 mg total) by mouth daily.   ticagrelor 90 MG Tabs tablet Commonly known as: BRILINTA Take 1 tablet (90 mg total) by mouth 2 (two) times daily.   traZODone 100 MG tablet Commonly known as: DESYREL TAKE 3 TABLETS BY MOUTH EVERY DAY AT BEDTIME AS NEEDED FOR SLEEP What changed:   how much to take  how to take this  when to take this  reasons to take this  additional instructions   Tresiba FlexTouch 100 UNIT/ML FlexTouch Pen Generic drug: insulin degludec Inject 16 Units into the skin daily.       Disposition and follow-up:   [name] left AMA from [facility] in [place] condition.  States that he felt very anxious and was no in the right "mindset" to have his stress test on at the hospital follow up visit please address:  1.  Follow up: Marland Kitchen Atypical chest pain- low suspicion of ACS here with negative troponins and reassuring EKG.  Plan for inpatient stress test but patient left AMA.  Follow-up for stress test outpatient . Leukocytosis and abdominal pain- reassuring CT this admission.  Suspect due to his history of celiac disease and gluten ingestion.  Follow-up pending TTG  2.  Labs / imaging needed at time of follow-up: BMP, CBC, stress test  3.  Pending labs/ test needing follow-up: TTG  Follow-up Appointments:   Hospital Course by problem list:  Atypical chest pain Patient has history of CAD with NSTEMI status post drug-eluting stent in January.  Presented with  right-sided chest pain for several days which is somewhat exertional.  EKG reassuring, troponin negative.  Chest pain did not change with nitroglycerin.  Cardiology consulted had low suspicion for ACS but plan for stress test.  Patient left prior to this. He followed with cardiology outpatient, consider stress test in that setting.  Abdominal pain Acute on chronic leukocytosis On chart review he seems to always have leukocytosis, possibly due to his self-reported celiac disease. His celiac disease was mentioned many times in our prior documentation, though I cannot find evidence of confirmatory testing. This may be the cause of his chronic leukocytosis. States that he does still eat some gluten containing products. TTG ordered but he left prior to result was available. CT reassuring.   Pertinent Labs, Studies, and Procedures:  CT Abdomen Pelvis W Contrast  Result Date: 09/11/2020 CLINICAL DATA:  Umbilical pain X 3 days, chest pain, nausea, vomiting EXAM: CT ABDOMEN AND PELVIS WITH CONTRAST TECHNIQUE: Multidetector CT imaging of the abdomen and pelvis was performed using the standard protocol following bolus administration of intravenous contrast. CONTRAST:  OMNIPAQUE IOHEXOL 300 MG/ML  SOLN COMPARISON:  08/17/2016 FINDINGS: Lower chest: Coronary calcifications. No pleural or pericardial effusion. Hepatobiliary: Fatty liver. No focal lesion. No biliary ductal dilatation. Gallbladder decompressed. Pancreas: Unremarkable. No pancreatic ductal dilatation or surrounding inflammatory changes. Spleen: Normal in size without focal abnormality. Adrenals/Urinary Tract: Normal adrenal glands. 1.8 cm probable cyst from the upper pole left kidney. No urolithiasis or hydronephrosis. Urinary bladder is physiologically distended. Stomach/Bowel: Stomach incompletely distended. Small bowel decompressed. Appendix surgically absent. The colon is nondilated, unremarkable. Vascular/Lymphatic: No significant vascular  findings are present. No enlarged abdominal or pelvic lymph nodes. Reproductive: Mild prostate enlargement with central coarse calcification. Other: Bilateral pelvic phleboliths.  No ascites.  No free air. Musculoskeletal: Chronic degenerative disc disease L3-4, L4-5. 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 No acute fracture or worrisome bone lesion. IMPRESSION: 1. No acute findings. 2. Fatty liver. 3. Coronary calcifications. The severity of coronary artery disease and any potential stenosis cannot be assessed on this non-gated CT examination. Electronically Signed   By: Corlis Leak M.D.   On: 09/11/2020 14:55   High sensitivity Troponin: 3  Recent Labs    09/09/20 1925 09/11/20 1249 09/12/20 0033  WBC 19.9* 19.6* 16.0*     Discharge Instructions: Unable to provide as he left AMA  Signed: Remo Lipps, MD 09/12/2020, 7:59 AM   Pager: [phone]

## 2020-09-13 ENCOUNTER — Telehealth (HOSPITAL_COMMUNITY): Payer: Self-pay | Admitting: *Deleted

## 2020-09-13 LAB — GLIA (IGA/G) + TTG IGA
Antigliadin Abs, IgA: 34 units — ABNORMAL HIGH (ref 0–19)
Gliadin IgG: 6 units (ref 0–19)
Tissue Transglutaminase Ab, IgA: 5 U/mL — ABNORMAL HIGH (ref 0–3)

## 2020-09-13 LAB — UREA NITROGEN, URINE: Urea Nitrogen, Ur: 342 mg/dL

## 2020-09-13 NOTE — Telephone Encounter (Signed)
Left message to call cardiac rehab.Gladstone Lighter, RN,BSN 09/13/2020 2:25 PM

## 2020-09-14 ENCOUNTER — Encounter (HOSPITAL_COMMUNITY): Payer: Self-pay | Admitting: Emergency Medicine

## 2020-09-14 ENCOUNTER — Other Ambulatory Visit: Payer: Self-pay

## 2020-09-14 ENCOUNTER — Encounter (HOSPITAL_COMMUNITY): Payer: Medicaid Other

## 2020-09-14 ENCOUNTER — Emergency Department (HOSPITAL_COMMUNITY)
Admission: EM | Admit: 2020-09-14 | Discharge: 2020-09-14 | Discharge status: Against Medical Advice | Payer: Medicaid Other | Attending: Emergency Medicine | Admitting: Emergency Medicine

## 2020-09-14 ENCOUNTER — Emergency Department (HOSPITAL_COMMUNITY): Payer: Medicaid Other

## 2020-09-14 DIAGNOSIS — R0602 Shortness of breath: Secondary | ICD-10-CM | POA: Diagnosis not present

## 2020-09-14 DIAGNOSIS — R110 Nausea: Secondary | ICD-10-CM | POA: Insufficient documentation

## 2020-09-14 DIAGNOSIS — Z5321 Procedure and treatment not carried out due to patient leaving prior to being seen by health care provider: Secondary | ICD-10-CM | POA: Insufficient documentation

## 2020-09-14 DIAGNOSIS — R079 Chest pain, unspecified: Secondary | ICD-10-CM | POA: Insufficient documentation

## 2020-09-14 DIAGNOSIS — R61 Generalized hyperhidrosis: Secondary | ICD-10-CM | POA: Diagnosis not present

## 2020-09-14 LAB — CBC
HCT: 51.2 % (ref 39.0–52.0)
Hemoglobin: 17.5 g/dL — ABNORMAL HIGH (ref 13.0–17.0)
MCH: 28.9 pg (ref 26.0–34.0)
MCHC: 34.2 g/dL (ref 30.0–36.0)
MCV: 84.5 fL (ref 80.0–100.0)
Platelets: 344 10*3/uL (ref 150–400)
RBC: 6.06 MIL/uL — ABNORMAL HIGH (ref 4.22–5.81)
RDW: 14.2 % (ref 11.5–15.5)
WBC: 18.7 10*3/uL — ABNORMAL HIGH (ref 4.0–10.5)
nRBC: 0 % (ref 0.0–0.2)

## 2020-09-14 LAB — BASIC METABOLIC PANEL
Anion gap: 14 (ref 5–15)
BUN: 9 mg/dL (ref 6–20)
CO2: 21 mmol/L — ABNORMAL LOW (ref 22–32)
Calcium: 9.8 mg/dL (ref 8.9–10.3)
Chloride: 94 mmol/L — ABNORMAL LOW (ref 98–111)
Creatinine, Ser: 0.97 mg/dL (ref 0.61–1.24)
GFR, Estimated: >60 mL/min (ref >60)
Glucose, Bld: 159 mg/dL — ABNORMAL HIGH (ref 70–99)
Potassium: 3.7 mmol/L (ref 3.5–5.1)
Sodium: 129 mmol/L — ABNORMAL LOW (ref 135–145)

## 2020-09-14 LAB — TROPONIN I (HIGH SENSITIVITY): Troponin I (High Sensitivity): 4 ng/L (ref <18)

## 2020-09-14 IMAGING — DX DG CHEST 2V
2 series · 2 of 2 positions shown · non-contrast
Comparison: [DATE]

CLINICAL DATA: Chest pain with shortness of breath

EXAM:
CHEST - 2 VIEW

[chest pa]
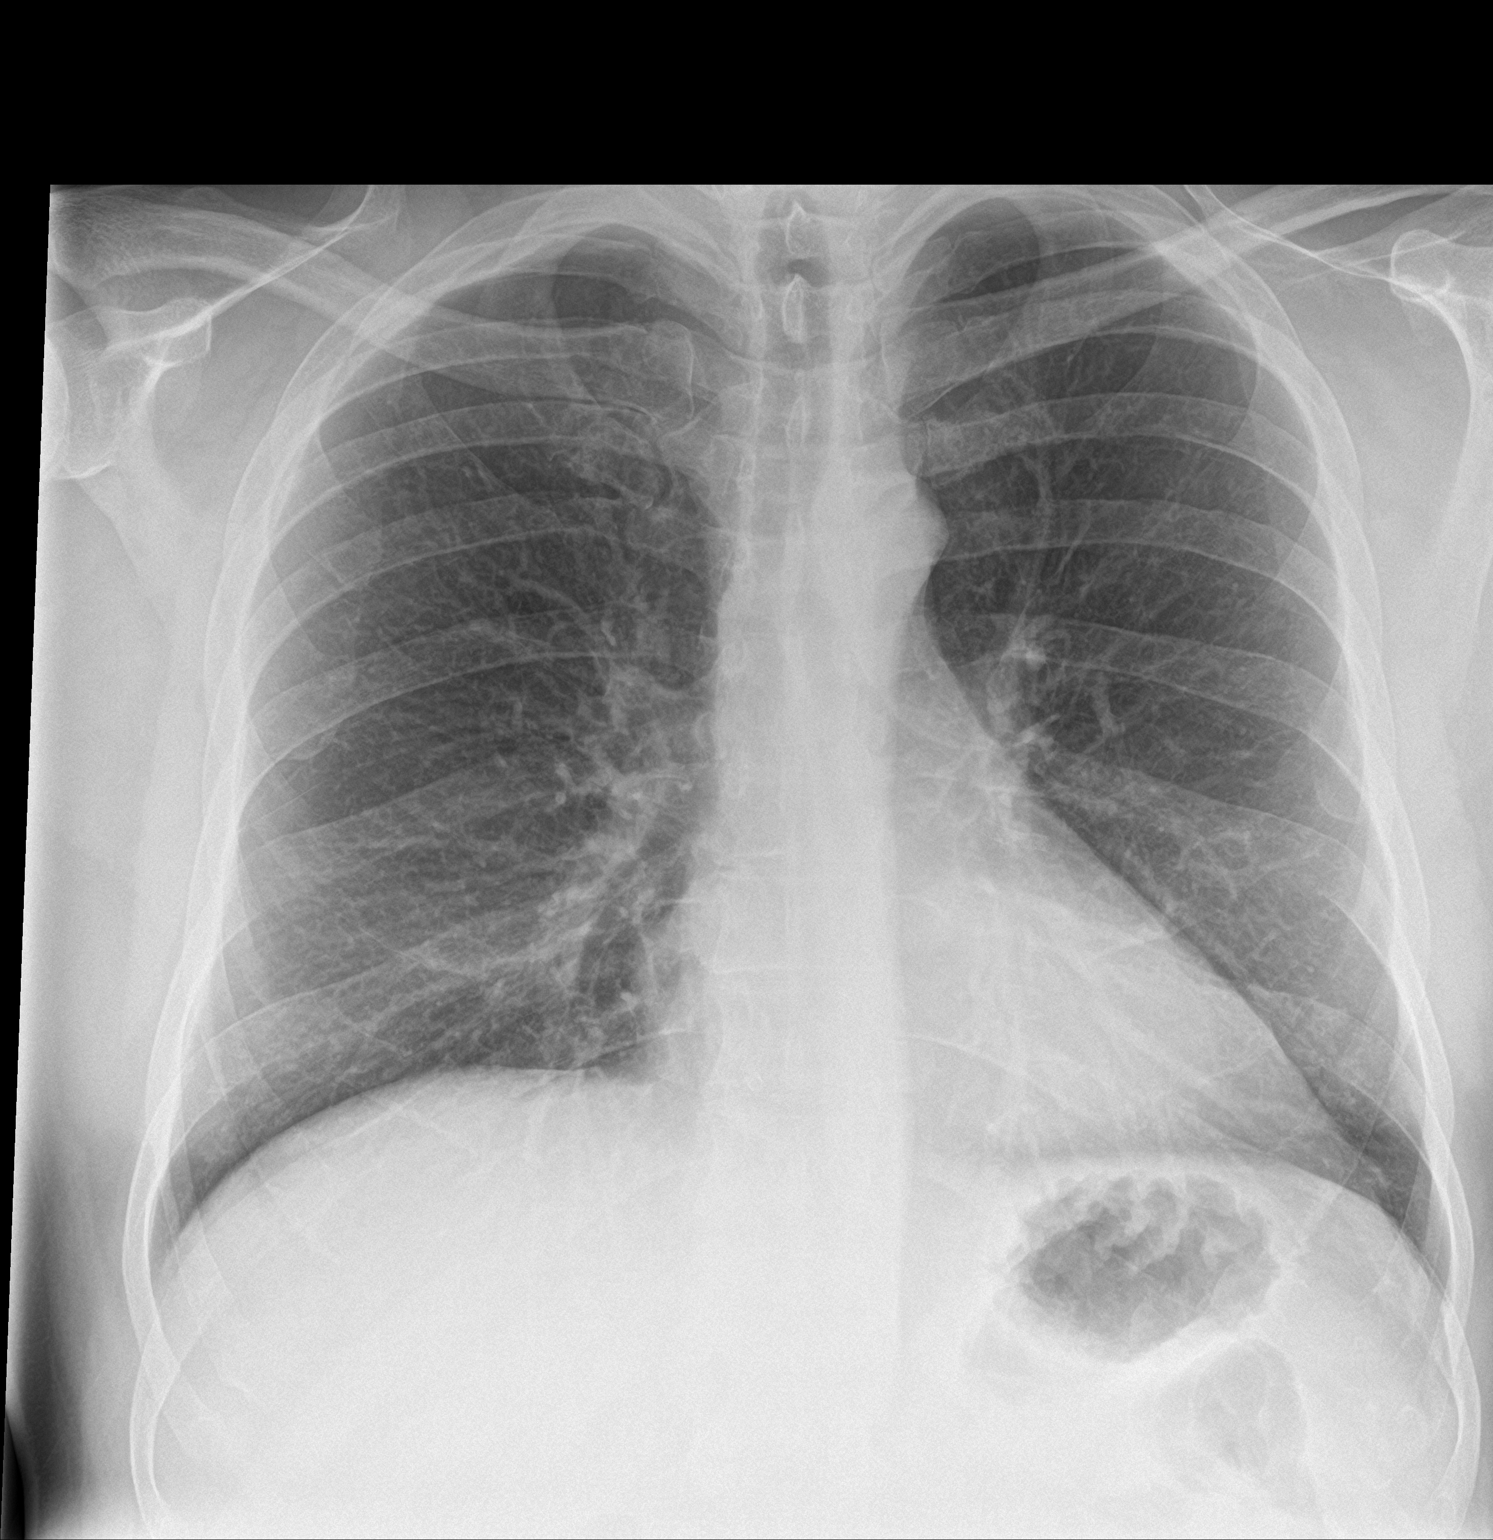

[chest lat]
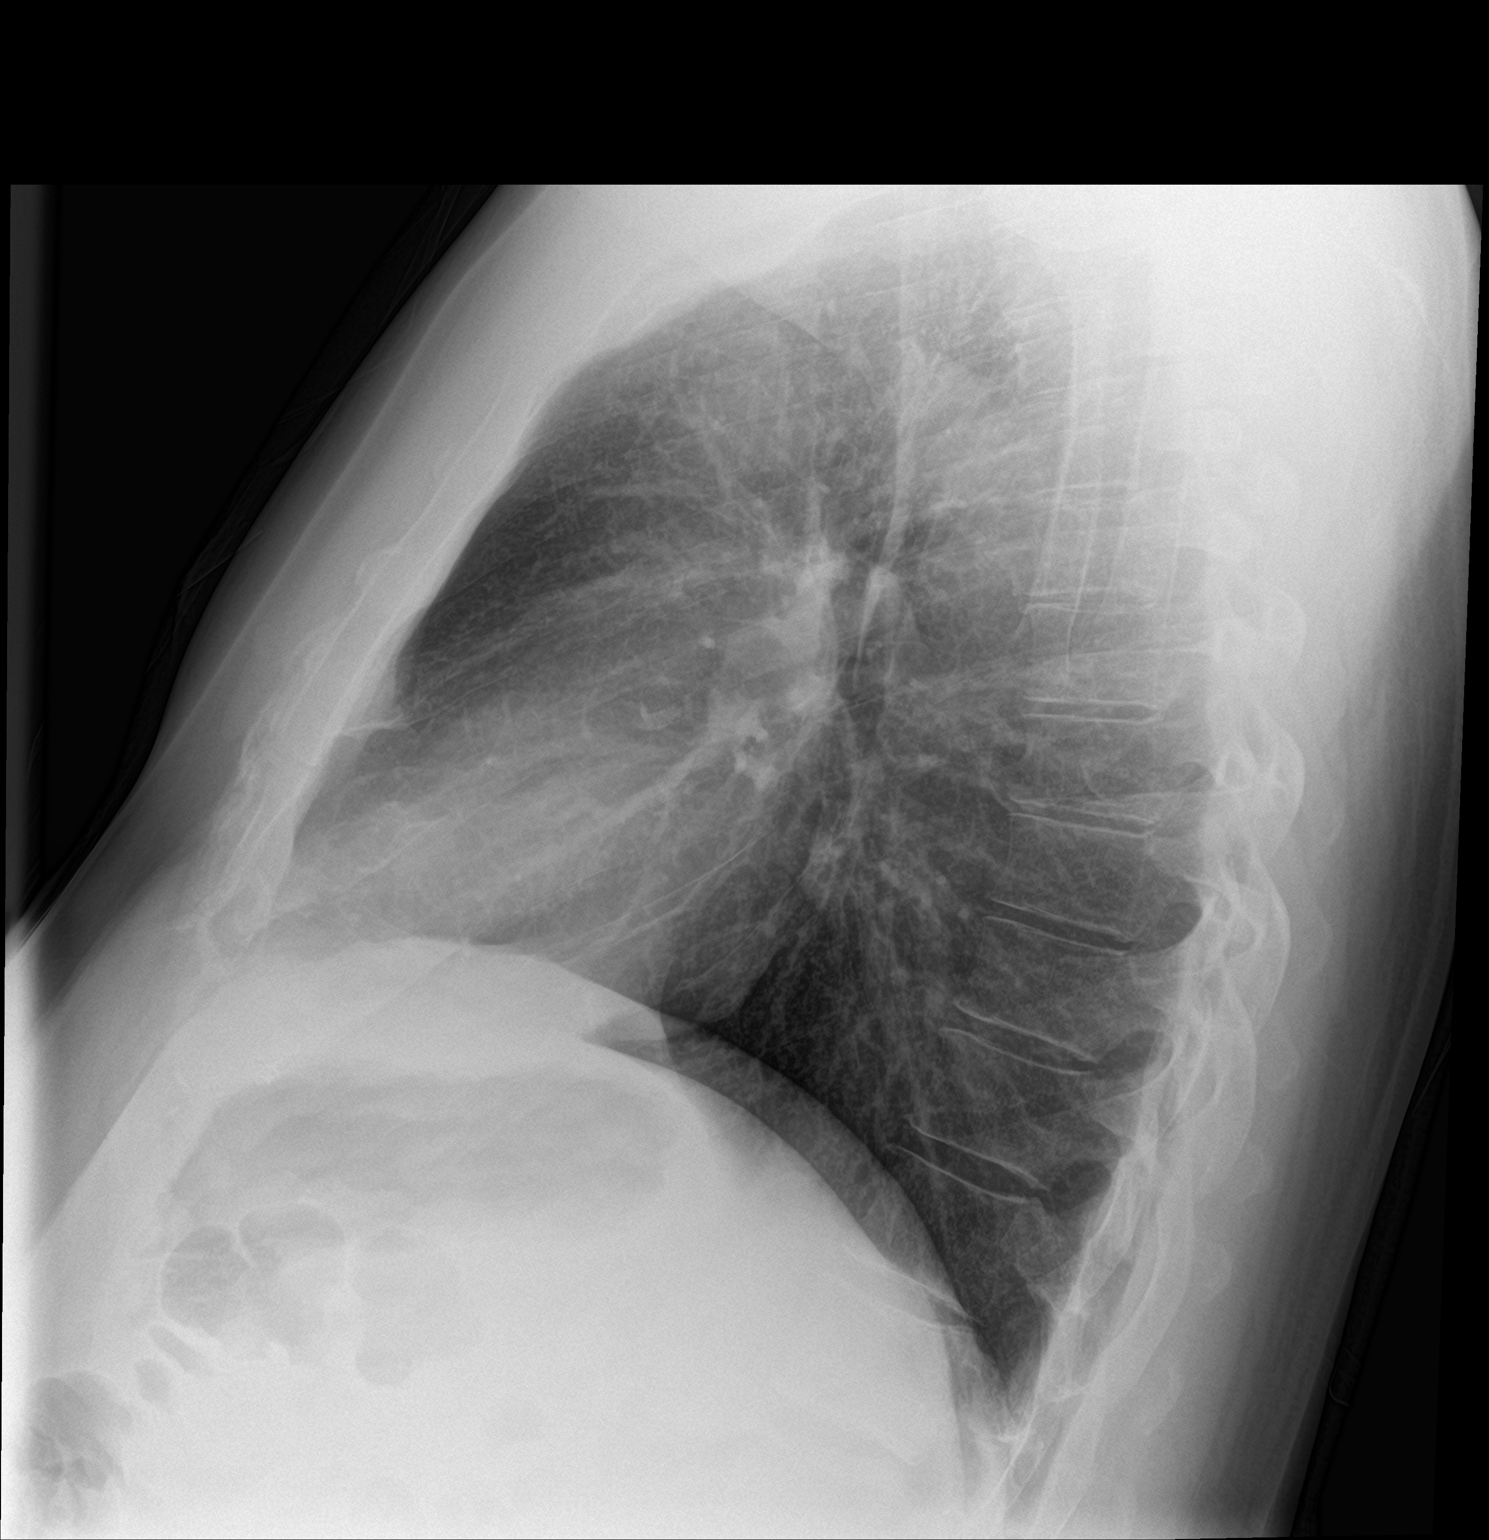

[2 of 2 positions shown; findings below may reference images not displayed]

FINDINGS: Lungs are clear. Heart size and pulmonary vascularity are normal. No
adenopathy. No pneumothorax. No bone lesions.
IMPRESSION: Lungs clear.  Cardiac silhouette normal.

## 2020-09-14 MED ORDER — ASPIRIN 81 MG PO CHEW: 324.0000 mg | CHEWABLE_TABLET | Freq: Once | ORAL | Status: DC

## 2020-09-14 NOTE — ED Triage Notes (Signed)
Pt here  From home with c/p ans sob and feeling faint , was here from same 3 days in last week , pt also c/o nausea

## 2020-09-14 NOTE — ED Notes (Signed)
Pt stated that he was unable to wait any longer to be seen. RN notified.

## 2020-09-14 NOTE — ED Triage Notes (Signed)
Emergency Medicine Provider Triage Evaluation Note  Matthew [name] , a 45 y.o. male  was evaluated in triage.  Pt complains of CP, seen in the ER 4/24, 4/25 for same, hx stents. Plan at dc on 4/25 was for OP follow up for stress test (per cards). CP right side, radiates to back, with nausea, diaphoresis, SHOB. Took Nitro at [employer] without relief. Same pain as previous.  Review of Systems  Positive: CP, SHOb, nausea, diaphoresis Negative: vomiting  Physical Exam  BP (!) 150/103 (BP Location: Left Arm)   Pulse (!) 111   Temp 97.8 F (36.6 C) (Oral)   Resp 20   SpO2 98%  Gen:   Awake, no distress   HEENT:  Atraumatic  Resp:  Normal effort  Cardiac:  Normal rate  Abd:   Nondistended, nontender  MSK:   Moves extremities without difficulty  Neuro:  Speech clear   Medical Decision Making  Medically screening exam initiated at 1:27 PM.  Appropriate orders placed.  Matthew [name] was informed that the remainder of the evaluation will be completed by another provider, this initial triage assessment does not replace that evaluation, and the importance of remaining in the ED until their evaluation is complete.  Clinical Impression     Jeannie Fend, PA-C 09/14/20 1332

## 2020-09-16 ENCOUNTER — Encounter (HOSPITAL_COMMUNITY): Payer: Medicaid Other

## 2020-09-16 LAB — CULTURE, BLOOD (ROUTINE X 2)
Culture: NO GROWTH
Culture: NO GROWTH
Special Requests: ADEQUATE

## 2020-09-19 ENCOUNTER — Encounter (HOSPITAL_COMMUNITY): Payer: Medicaid Other

## 2020-09-19 ENCOUNTER — Telehealth: Payer: Self-pay | Admitting: Cardiology

## 2020-09-19 ENCOUNTER — Telehealth (HOSPITAL_COMMUNITY): Payer: Self-pay | Admitting: *Deleted

## 2020-09-19 LAB — URINE DRUGS OF ABUSE SCREEN W ALC, ROUTINE (REF LAB)
Amphetamines, Urine: NEGATIVE ng/mL
Barbiturate, Ur: NEGATIVE ng/mL
Benzodiazepine Quant, Ur: NEGATIVE ng/mL
Cannabinoid Quant, Ur: NEGATIVE ng/mL
Cocaine (Metab.): NEGATIVE ng/mL
Ethanol U, Quan: NEGATIVE %
Methadone Screen, Urine: NEGATIVE ng/mL
Phencyclidine, Ur: NEGATIVE ng/mL
Propoxyphene, Urine: NEGATIVE ng/mL

## 2020-09-19 LAB — OPIATES CONFIRMATION, URINE
CODEINE: NEGATIVE
MORPHINE: POSITIVE — AB
Morphine GC/MS Conf: 1188 ng/mL
OPIATES: POSITIVE — AB

## 2020-09-19 NOTE — Telephone Encounter (Signed)
Dr. Shari Prows, this pt called into the office today to get an appt with you or an APP at next available, to be seen so he can get clearance to participate in cardiac rehab again. Pt recently went to the ER for chest pain and left AMA.  Byrd Hesselbach RN with Cardiac rehab sent [place] a staff message about this, stating the pt would not be able to participate in their services until cleared by Cards, despite [place] seeing the pt on 4/13.  Pt called in to make this appt.  Asked pt if he was having any cardiac complaints recently since he leaving the hospital AMA, or current to date.  Pt denied any cardiac symptoms at this time.  He is currently at home resting.  Pt states he is taking all his prescribed cardiac meds, but was very hesitant when asking him this.  First available appt with you or an APP, at our office and [facility] office, is not until 10/10/20.  Scheduled him to see you on 10/10/20 at 0930.  He is aware to arrive 15 mins prior to this appt.  ED precautions provided to the pt if chest pain or any other cardiac symptoms return, worsen, or persist.  Nitroglycerin education provided to the pt.  Advised him to make sure he is being compliant with taking all his cardiac meds.  Did inform him that I would route this message to you to further review and  advise on as needed, and I would follow-up with him accordingly thereafter.  Pt verbalized understanding and agrees with this plan.

## 2020-09-19 NOTE — Telephone Encounter (Signed)
Attempted to call the pt back to endorse compliance recommendations per Dr. Shari Prows, and pt did not answer.

## 2020-09-19 NOTE — Telephone Encounter (Addendum)
Spoke with [name] patient as been to the ED several times and left AMA. Will not be able to return to cardiac rehab until MD clearance. Patient.  states understanding. Will cancel exercise appointments at this time. Patient may not be appropriate to return to group setting due to non compliance. [name] attended 6 exercise sessions between 08/22/20-09/07/20. Gladstone Lighter, RN,BSN 09/19/2020 4:49 PM

## 2020-09-19 NOTE — Telephone Encounter (Signed)
No problem. He will have to wait until he is seen to be cleared again for cardiac rehab. Compliance will need to be re-emphasized. He is high risk for repeat ER visits.

## 2020-09-19 NOTE — Telephone Encounter (Signed)
Pt c/o of Chest Pain: STAT if CP now or developed within 24 hours  1. Are you having CP right now? No   2. Are you experiencing any other symptoms (ex. SOB, nausea, vomiting, sweating)? Back pain   3. How long have you been experiencing CP? A week and a half   4. Is your CP continuous or coming and going? Coming and going   5. Have you taken Nitroglycerin? Yes, took 2 days ago. Hasn't had CP since.  ? Matthew [name] is calling stating he needs to be cleared by Dr. Shari Prows to workout with cardiac rehab due to this. Please advise.

## 2020-09-21 ENCOUNTER — Encounter (HOSPITAL_COMMUNITY): Payer: Medicaid Other

## 2020-09-23 ENCOUNTER — Encounter (HOSPITAL_COMMUNITY): Payer: Medicaid Other

## 2020-09-26 ENCOUNTER — Encounter (HOSPITAL_COMMUNITY): Payer: Medicaid Other

## 2020-09-28 ENCOUNTER — Encounter (HOSPITAL_COMMUNITY): Payer: Medicaid Other

## 2020-09-30 ENCOUNTER — Encounter (HOSPITAL_COMMUNITY): Payer: Medicaid Other

## 2020-10-02 ENCOUNTER — Other Ambulatory Visit: Payer: Self-pay | Admitting: Psychiatry

## 2020-10-02 DIAGNOSIS — F250 Schizoaffective disorder, bipolar type: Secondary | ICD-10-CM

## 2020-10-03 ENCOUNTER — Ambulatory Visit (INDEPENDENT_AMBULATORY_CARE_PROVIDER_SITE_OTHER): Payer: Self-pay | Admitting: Physician Assistant

## 2020-10-03 ENCOUNTER — Other Ambulatory Visit: Payer: Self-pay

## 2020-10-03 ENCOUNTER — Encounter (HOSPITAL_COMMUNITY): Payer: Medicaid Other

## 2020-10-03 ENCOUNTER — Encounter: Payer: Self-pay | Admitting: Physician Assistant

## 2020-10-03 DIAGNOSIS — F99 Mental disorder, not otherwise specified: Secondary | ICD-10-CM

## 2020-10-03 DIAGNOSIS — F902 Attention-deficit hyperactivity disorder, combined type: Secondary | ICD-10-CM

## 2020-10-03 DIAGNOSIS — F5105 Insomnia due to other mental disorder: Secondary | ICD-10-CM

## 2020-10-03 DIAGNOSIS — F250 Schizoaffective disorder, bipolar type: Secondary | ICD-10-CM

## 2020-10-03 DIAGNOSIS — F411 Generalized anxiety disorder: Secondary | ICD-10-CM

## 2020-10-03 MED ORDER — CLONAZEPAM 1 MG PO TABS: 1.0000 mg | ORAL_TABLET | Freq: Three times a day (TID) | ORAL | 1 refills | Status: DC | PRN

## 2020-10-03 MED ORDER — GABAPENTIN 800 MG PO TABS: ORAL_TABLET | ORAL | 1 refills | Status: DC
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
## 2020-10-05 ENCOUNTER — Encounter (HOSPITAL_COMMUNITY): Payer: Medicaid Other

## 2020-10-07 ENCOUNTER — Encounter (HOSPITAL_COMMUNITY): Payer: Medicaid Other

## 2020-10-08 NOTE — Progress Notes (Deleted)
Cardiology Office Note:    Date:  10/08/2020   ID:  Matthew [name], DOB 1976-04-27, MRN [id number]  PCP:  [name], MD   [facility]  Cardiologist:  Meriam Sprague, MD  Advanced Practice Provider:  No care team member to display Electrophysiologist:  None   Referring MD: [name], MD    History of Present Illness:    Matthew [name] is a 45 y.o. male with a hx of HTN, HLD, pre-DM, celiac dz, schizoaffective d/o, anxiety, ADHD, OSAnot on CPAP, and recent NSTEMI s/p DES to pLAD and DES to dRCA who presents to clinic for follow-up.  Patient admitted with NSTEMI 06/06/20 treated with DES pLAD and DES dRCA, and severe LVEF 25-30%. Patient left AMA 06/08/19 and was seen urgently in clinic by Jacolyn Reedy. During that visit, he was more SOB and had been eating a lot of fast food and had not filled his scripts for atenolol or lisinopril. Since that time, he has filled his prescriptions and has been taking all medications as prescribed. He understands the importance of taking his medications and that stopping his ASA or brilinta could lead to a massive MI and/or death. He states that since his hospitalization, he has been feeling overall okay.  Occasionally has some palpitations with some short of breath and mild lightheadedness. Episodes occur about every other day and last a couple of minutes before resolving. Can happen at rest or with activity. No chest pain.   During visit on 06/22/20, the patient had been having palpitations. He was referred for cardiac monitor and cardiac rehab. Cardiac monitor showed: rare ectopy with no significant arrhythmias or ectopy.  During visit on 07/20/20 he was having intermittent chest tightness. He has a subsequent ER visit on 08/10/20 for abdominal pain. Work-up reassuring and he was discharged in imdur 30mg  daily as well as nitro.  Last seen in clinic 08/31/20, he was very tachycardic usually related to heavy smoking. Was  not taking his imdur, nitro, jardiance or insulin. He was otherwise doing well from a CV standpoint.    Past Medical History:  Diagnosis Date  . Anxiety   . Celiac disease   . Coronary artery disease   . Dairy allergy   . Depression   . Diabetes mellitus without complication (HCC)   . High cholesterol   . Hypertension   . Paranoia (HCC)   . Pre-diabetes   . Schizoaffective disorder [facility])     Past Surgical History:  Procedure Laterality Date  . APPENDECTOMY    . BRAIN SURGERY    . CARDIAC CATHETERIZATION    . CORONARY STENT INTERVENTION N/A 06/06/2020   Procedure: CORONARY STENT INTERVENTION;  Surgeon: 06/08/2020, Peter M, MD;  Location[facility] INVASIVE CV LAB;  Service: Cardiovascular;  Laterality: N/A;  prox LAD, distal RCA  . INTRAVASCULAR ULTRASOUND/IVUS N/A 06/06/2020   Procedure: Intravascular Ultrasound/IVUS;  Surgeon: 06/08/2020, Peter M, MD;  Location: [facility] INVASIVE CV LAB;  Service: Cardiovascular;  Laterality: N/A;  . LEFT HEART CATH AND CORONARY ANGIOGRAPHY N/A 06/06/2020   Procedure: LEFT HEART CATH AND CORONARY ANGIOGRAPHY;  Surgeon: 06/08/2020, Peter M, MD;  Location: [facility] INVASIVE CV LAB;  Service: Cardiovascular;  Laterality: N/A;  . NASAL SEPTUM SURGERY    . WRIST FRACTURE SURGERY     right wrist    Current Medications: No outpatient medications have been marked as taking for the 10/10/20 encounter (Appointment) with 10/12/20, MD.     Allergies:  Gluten meal   Social History   Socioeconomic History  . Marital status: Single    Spouse name: Not on file  . Number of children: 0  . Years of education: 55  . Highest education level: Associate degree: occupational, Scientist, product/process development, or vocational program  Occupational History  . Occupation: [employer], unemployed  Tobacco Use  . Smoking status: Heavy Tobacco Smoker    Packs/day: 1.00    Types: Cigarettes  . Smokeless tobacco: Never Used  . Tobacco comment: patient given phone number to 1-800-quit now on 08/16/20   Vaping Use  . Vaping Use: Never used  Substance and Sexual Activity  . Alcohol use: No    Alcohol/week: 0.0 standard drinks    Comment: no (02/08/15)  . Drug use: No  . Sexual activity: Not on file  Other Topics Concern  . Not on file  Social History Narrative   Pt lives at the [facility]   Social Determinants of Health   Financial Resource Strain: Not on file  Food Insecurity: Not on file  Transportation Needs: Not on file  Physical Activity: Not on file  Stress: Not on file  Social Connections: Not on file     Family History: The patient's family history includes Asthma in his mother; Hypertension in his father and mother.  ROS:   Please see the history of present illness.    Review of Systems  Constitutional: Negative for chills and fever.  HENT: Negative for hearing loss.   Eyes: Negative for blurred vision and redness.  Respiratory: Negative for shortness of breath.   Cardiovascular: Positive for chest pain and palpitations. Negative for orthopnea, claudication, leg swelling and PND.  Gastrointestinal: Negative for melena, nausea and vomiting.  Genitourinary: Negative for dysuria and flank pain.  Musculoskeletal: Negative for falls.  Neurological: Negative for dizziness and loss of consciousness.  Endo/Heme/Allergies: Negative for polydipsia.  Psychiatric/Behavioral: Positive for depression.    EKGs/Labs/Other Studies Reviewed:    The following studies were reviewed today: Left Heart Cath 06/06/2020: 1. Severe 2 vessel obstructive CAD - 99% proximal LAD - 90% distal RCA 2. Severe LV dysfunction. EF estimated at 25-30%. Segmental wall motion abnormalities. 3. Moderately elevated LVEDP 4. Successful PCI of the proximal LAD with IVUS guidance and DES x 1 5. Successful PCI of the distal RCA with IVUS guidance and DES x 1  Cardiac monitor 07/19/20: Patch Wear Time:  7 days and 17 hours (2022-02-12T16:36:22-0500 to  2022-02-20T10:06:03-499)  Patient had a min HR of 55 bpm, max HR of 135 bpm, and avg HR of 88 bpm. Predominant underlying rhythm was Sinus Rhythm. No Isolated SVEs, SVE Couplets, or SVE Triplets were present. Isolated VEs were rare (<1.0%), VE Couplets were rare (<1.0%), and no  VE Triplets were present.   EKG:  EKG is  ordered today.  The ekg ordered today demonstrates sinus tachycardia with HR 106, first degree AVB, poor r-wave progression, inferior infarct  Recent Labs: 06/06/2020: TSH 2.644 06/07/2020: B Natriuretic Peptide 151.1; Magnesium 1.9 09/12/2020: ALT 33 09/14/2020: BUN 9; Creatinine, Ser 0.97; Hemoglobin 17.5; Platelets 344; Potassium 3.7; Sodium 129  Recent Lipid Panel    Component Value Date/Time   CHOL 133 08/15/2020 1526   TRIG 377.0 (H) 08/15/2020 1526   HDL 23.50 (L) 08/15/2020 1526   CHOLHDL 6 08/15/2020 1526   VLDL 75.4 (H) 08/15/2020 1526   LDLCALC UNABLE TO CALCULATE IF TRIGLYCERIDE OVER 400 mg/dL 22/06/5425 0623   LDLDIRECT 68.0 08/15/2020 1526     Physical  Exam:    VS:  There were no vitals taken for this visit.    Wt Readings from Last 3 Encounters:  09/12/20 270 lb (122.5 kg)  09/12/20 253 lb 15.5 oz (115.2 kg)  09/09/20 255 lb 11.7 oz (116 kg)     GEN:  Well nourished, well developed in no acute distress HEENT: Normal NECK: No JVD; No carotid bruits CARDIAC: RRR, no murmurs, rubs, gallops RESPIRATORY:  Clear to auscultation without rales, wheezing or rhonchi  ABDOMEN: Soft, non-tender, non-distended MUSCULOSKELETAL:  No edema; No deformity  SKIN: Warm and dry NEUROLOGIC:  Alert and oriented x 3 PSYCHIATRIC:  Normal affect   ASSESSMENT:    No diagnosis found. PLAN:    In order of problems listed above:  #NSTEMI: #Multivessel CAD: Patient presented to [facility] on [facility]/06/20 with significant chest pain found to have elevated troponin c/w NSTEMI. Underwent coronary angiography which revealed99% proximal LADand90% distal RCAboth of which were  treated successfuly with DES. LV gram with LVEF 25-30%. Unfortunately, patient left AMA overnight without optimization of medications.Now more compliant although appears he is stil intermittnetly missing doses of medications or not picking them up at his pharmacy (states he is taking everything but imdur currently). Fortunately, he has followed up at all appointments and attended Cardiac Rehab. Concerned that medication compliance will be an ongoing issue for him.   -TTE with recovered EF 65-70%, no significant WMA -Continue ASA and brilinta -Continue lisinopril20mg  daily -Increaseto coreg 25mg  BID -Continuelipitor to 40mg  daily -Continue spironolactone 12.5mg  daily -Continue jardiance 10mg  daily -Continue cardiac rehab -Tobacco cessation counseling provided extensively today; patient states he wants to try to cut back -Counseled about diet and exercise -Needs continued encouragement about medication compliance  #Ischemic Cardiomyopathy with LVEF 25-30%: #Chronic Systolic heart faillure EF approximately 25-30% on LV gram-->now improved to 65-70% on TTE on 08/12/20. Euvolemic on examination.  -TTE with recovered EF 65-70% -Continue lisinopril 20mg  daily -Increaseto coreg 25mg  BID -Continue spironolactone 12.5mg  daily and jardiance 10mg  daily as above -Low Na diet  #Palpitations: Improved. Cardiac monitor without significant ectopy. Rare PVCs. -Continuecoreg as above  #DMII: A1C 12.6%. -Referredto Endocrinology; working getting insulin coverage -Continue metformin 1000mg  daily -Continue jardiance 10mg  daily--supposed to be increased to 25mg  daily but patient has not picked up script--encouraged him to fill his medications  #OSA not on CPAP: -Follow-up with Pulm  #Tobacco abuse: -Cessation counseling -Continue nicotine patches -Needs extensive counseling going forward  #Schoizoaffective disorder: #Depression: -Management per psych  Medication Adjustments/Labs  and Tests Ordered: Current medicines are reviewed at length with the patient today.  Concerns regarding medicines are outlined above.  No orders of the defined types were placed in this encounter.  No orders of the defined types were placed in this encounter.   There are no Patient Instructions on file for this visit.   Signed, , MD  10/08/2020 11:14 AM    [facility]

## 2020-10-10 ENCOUNTER — Ambulatory Visit (INDEPENDENT_AMBULATORY_CARE_PROVIDER_SITE_OTHER): Payer: Medicaid Other | Admitting: Cardiology

## 2020-10-10 ENCOUNTER — Encounter (HOSPITAL_COMMUNITY): Payer: Medicaid Other

## 2020-10-10 ENCOUNTER — Encounter: Payer: Self-pay | Admitting: Cardiology

## 2020-10-10 ENCOUNTER — Other Ambulatory Visit: Payer: Self-pay

## 2020-10-10 VITALS — BP 134/90 | HR 109 | Ht 74.0 in | Wt 264.4 lb

## 2020-10-10 DIAGNOSIS — F411 Generalized anxiety disorder: Secondary | ICD-10-CM

## 2020-10-10 DIAGNOSIS — I10 Essential (primary) hypertension: Secondary | ICD-10-CM

## 2020-10-10 DIAGNOSIS — E669 Obesity, unspecified: Secondary | ICD-10-CM

## 2020-10-10 DIAGNOSIS — I2510 Atherosclerotic heart disease of native coronary artery without angina pectoris: Secondary | ICD-10-CM | POA: Diagnosis not present

## 2020-10-10 DIAGNOSIS — I5022 Chronic systolic (congestive) heart failure: Secondary | ICD-10-CM

## 2020-10-10 DIAGNOSIS — I255 Ischemic cardiomyopathy: Secondary | ICD-10-CM

## 2020-10-10 DIAGNOSIS — M549 Dorsalgia, unspecified: Secondary | ICD-10-CM

## 2020-10-10 DIAGNOSIS — E1165 Type 2 diabetes mellitus with hyperglycemia: Secondary | ICD-10-CM | POA: Diagnosis not present

## 2020-10-10 DIAGNOSIS — G4733 Obstructive sleep apnea (adult) (pediatric): Secondary | ICD-10-CM

## 2020-10-10 MED ORDER — CARVEDILOL 25 MG PO TABS: 25.0000 mg | ORAL_TABLET | Freq: Two times a day (BID) | ORAL | 3 refills | Status: DC

## 2020-10-10 MED ORDER — EMPAGLIFLOZIN 25 MG PO TABS: 25.0000 mg | ORAL_TABLET | Freq: Every day | ORAL | 2 refills | Status: DC

## 2020-10-10 NOTE — Telephone Encounter (Signed)
Good afternoon Matthew [name],    Matthew [name] is really not appropriate to return to cardiac rehab for group exercise. He has already been discharged from the program. We will be happy to discuss with Dr Shari Prows if further discussion is needed..    Thanks     Maria     Made Dr. Shari Prows aware of this.  Pt will still follow-up with [place] as planned and proceed with all recommendations advised to him at today's office visit.

## 2020-10-10 NOTE — Progress Notes (Signed)
Cardiology Office Note:    Date:  10/10/2020   ID:  Matthew [name], DOB 1976/05/20, MRN [id number]  PCP:  [name], MD   [facility]  Cardiologist:  Meriam Sprague, MD  Advanced Practice Provider:  No care team member to display Electrophysiologist:  None   Referring MD: [name], MD    History of Present Illness:    Matthew [name] is a 45 y.o. male with a hx of HTN, HLD, pre-DM, celiac dz, schizoaffective d/o, anxiety, ADHD, OSAnot on CPAP, and recent NSTEMI s/p DES to pLAD and DES to dRCA who presents to clinic for follow-up.  Patient admitted with NSTEMI 06/06/20 treated with DES pLAD and DES dRCA, and severe LVEF 25-30%. Patient left AMA 06/08/19 and was seen urgently in clinic by Jacolyn Reedy. During that visit, he was more SOB and had been eating a lot of fast food and had not filled his scripts for atenolol or lisinopril. Since that time, he has filled his prescriptions and has been taking all medications as prescribed. He understands the importance of taking his medications and that stopping his ASA or brilinta could lead to a massive MI and/or death. He states that since his hospitalization, he has been feeling overall okay.  Occasionally has some palpitations with some short of breath and mild lightheadedness. Episodes occur about every other day and last a couple of minutes before resolving. Can happen at rest or with activity. No chest pain.   During visit on 06/22/20, the patient had been having palpitations. He was referred for cardiac monitor and cardiac rehab. Cardiac monitor showed: rare ectopy with no significant arrhythmias or ectopy.  During visit on 07/20/20 he was having intermittent chest tightness. He has a subsequent ER visit on 08/10/20 for abdominal pain. Work-up reassuring and he was discharged in imdur 30mg  daily as well as nitro.  Last seen in clinic 08/31/20, he was very tachycardic usually related to heavy smoking. Was  not taking his imdur, nitro, jardiance or insulin. He was otherwise doing well from a CV standpoint.  Today, he is doing overall well from a CV standpoint but has been having bad low back pain. He states that he has lost weight and has decreased his smoking to 1 pack a day since his last visit by walking more frequently. He plans to see a nutritionist at the end of this week. His at home glucose levels averages under 300. He is complaint with his medications including his insulin. He denies having exertional chest pain, tightness or pressure. No fatigue throughout the day. Not monitoring blood pressure at home.  Past Medical History:  Diagnosis Date  . Anxiety   . Celiac disease   . Coronary artery disease   . Dairy allergy   . Depression   . Diabetes mellitus without complication (HCC)   . High cholesterol   . Hypertension   . Paranoia (HCC)   . Pre-diabetes   . Schizoaffective disorder [facility])     Past Surgical History:  Procedure Laterality Date  . APPENDECTOMY    . BRAIN SURGERY    . CARDIAC CATHETERIZATION    . CORONARY STENT INTERVENTION N/A 06/06/2020   Procedure: CORONARY STENT INTERVENTION;  Surgeon: 06/08/2020, Peter M, MD;  Location: [facility] INVASIVE CV LAB;  Service: Cardiovascular;  Laterality: N/A;  prox LAD, distal RCA  . INTRAVASCULAR ULTRASOUND/IVUS N/A 06/06/2020   Procedure: Intravascular Ultrasound/IVUS;  Surgeon: 06/08/2020, Peter M, MD;  Location: [facility] INVASIVE CV LAB;  Service: Cardiovascular;  Laterality: N/A;  . LEFT HEART CATH AND CORONARY ANGIOGRAPHY N/A 06/06/2020   Procedure: LEFT HEART CATH AND CORONARY ANGIOGRAPHY;  Surgeon: [place], Peter M, MD;  Location: [facility] INVASIVE CV LAB;  Service: Cardiovascular;  Laterality: N/A;  . NASAL SEPTUM SURGERY    . WRIST FRACTURE SURGERY     right wrist    Current Medications: Current Meds  Medication Sig  . aspirin EC 81 MG tablet Take 1 tablet (81 mg total) by mouth daily. Swallow whole.  . carvedilol (COREG) 25 MG tablet Take 1  tablet (25 mg total) by mouth 2 (two) times daily.  Melene Muller ON 10/14/2020] clonazePAM (KLONOPIN) 1 MG tablet Take 1 tablet (1 mg total) by mouth 3 (three) times daily as needed for anxiety.  . DULoxetine (CYMBALTA) 60 MG capsule Take 1 capsule (60 mg total) by mouth 2 (two) times daily.  Marland Kitchen gabapentin (NEURONTIN) 800 MG tablet 1 po q am, 1/2 po at noon, 1 po at 4 pm, and 1 po at bedtime.  . insulin degludec (TRESIBA FLEXTOUCH) 100 UNIT/ML FlexTouch Pen Inject 16 Units into the skin daily.  . Insulin Pen Needle 32G X 4 MM MISC 1 Device by Does not apply route daily.  . isosorbide mononitrate (IMDUR) 30 MG 24 hr tablet Take 0.5 tablets (15 mg total) by mouth daily.  Marland Kitchen lisinopril (ZESTRIL) 20 MG tablet Take 1 tablet (20 mg total) by mouth daily.  Marland Kitchen lithium carbonate (LITHOBID) 300 MG CR tablet TAKE 2 TABLETS (600 MG TOTAL) BY MOUTH AT BEDTIME.  . metFORMIN (GLUCOPHAGE) 1000 MG tablet Take 1 tablet (1,000 mg total) by mouth 2 (two) times daily with a meal.  . nitroGLYCERIN (NITROSTAT) 0.4 MG SL tablet Place 1 tablet (0.4 mg total) under the tongue every 5 (five) minutes as needed for chest pain.  Marland Kitchen perphenazine (TRILAFON) 16 MG tablet TAKE 1 PILL TOTAL 16 MG EVERY MORNING AND TAKE 2 PILLS BY MOUTH TOTAL 32 MG EVERY BEDTIME  . spironolactone (ALDACTONE) 25 MG tablet Take 0.5 tablets (12.5 mg total) by mouth daily.  . ticagrelor (BRILINTA) 90 MG TABS tablet Take 1 tablet (90 mg total) by mouth 2 (two) times daily.  . traZODone (DESYREL) 100 MG tablet TAKE 3 TABLETS BY MOUTH EVERY DAY AT BEDTIME AS NEEDED FOR SLEEP  . [DISCONTINUED] carvedilol (COREG) 12.5 MG tablet Take 1 tablet (12.5 mg total) by mouth 2 (two) times daily with a meal.  . [DISCONTINUED] empagliflozin (JARDIANCE) 25 MG TABS tablet Take 1 tablet (25 mg total) by mouth daily before breakfast.     Allergies:   Gluten meal   Social History   Socioeconomic History  . Marital status: Single    Spouse name: Not on file  . Number of  children: 0  . Years of education: 20  . Highest education level: Associate degree: occupational, Scientist, product/process development, or vocational program  Occupational History  . Occupation: [employer], unemployed  Tobacco Use  . Smoking status: Heavy Tobacco Smoker    Packs/day: 1.00    Types: Cigarettes  . Smokeless tobacco: Never Used  . Tobacco comment: patient given phone number to 1-800-quit now on 08/16/20  Vaping Use  . Vaping Use: Never used  Substance and Sexual Activity  . Alcohol use: No    Alcohol/week: 0.0 standard drinks    Comment: no (02/08/15)  . Drug use: No  . Sexual activity: Not on file  Other Topics Concern  . Not on file  Social History Narrative  Pt lives at the [facility]   Social Determinants of Health   Financial Resource Strain: Not on file  Food Insecurity: Not on file  Transportation Needs: Not on file  Physical Activity: Not on file  Stress: Not on file  Social Connections: Not on file     Family History: The patient's family history includes Asthma in his mother; Hypertension in his father and mother.  ROS:   Please see the history of present illness.    Review of Systems  Constitutional: Negative for chills, fever and malaise/fatigue.  HENT: Negative for congestion and hearing loss.   Eyes: Negative for blurred vision and redness.  Respiratory: Negative for cough and shortness of breath.   Cardiovascular: Negative for chest pain, palpitations, orthopnea, leg swelling and PND.  Gastrointestinal: Negative for constipation, nausea and vomiting.  Genitourinary: Negative for dysuria and flank pain.  Musculoskeletal: Positive for back pain (lower ). Negative for falls.       (+) radiating pain down to lower extremities   Neurological: Negative for dizziness, loss of consciousness and headaches.  Endo/Heme/Allergies: Negative for polydipsia.    EKGs/Labs/Other Studies Reviewed:    The following studies were reviewed today: Left Heart Cath 06/06/2020: 1.  Severe 2 vessel obstructive CAD - 99% proximal LAD - 90% distal RCA 2. Severe LV dysfunction. EF estimated at 25-30%. Segmental wall motion abnormalities. 3. Moderately elevated LVEDP 4. Successful PCI of the proximal LAD with IVUS guidance and DES x 1 5. Successful PCI of the distal RCA with IVUS guidance and DES x 1  Cardiac monitor 07/19/20: Patch Wear Time:  7 days and 17 hours (2022-02-12T16:36:22-0500 to 2022-02-20T10:06:03-499)  Patient had a min HR of 55 bpm, max HR of 135 bpm, and avg HR of 88 bpm. Predominant underlying rhythm was Sinus Rhythm. No Isolated SVEs, SVE Couplets, or SVE Triplets were present. Isolated VEs were rare (<1.0%), VE Couplets were rare (<1.0%), and no  VE Triplets were present.   EKG:  10/10/20-Sinus tachycardia, rate: 109, poor r-wave progression, inferior Q waves, no changes from his previous visit    Recent Labs: 06/06/2020: TSH 2.644 06/07/2020: B Natriuretic Peptide 151.1; Magnesium 1.9 09/12/2020: ALT 33 09/14/2020: BUN 9; Creatinine, Ser 0.97; Hemoglobin 17.5; Platelets 344; Potassium 3.7; Sodium 129  Recent Lipid Panel    Component Value Date/Time   CHOL 133 08/15/2020 1526   TRIG 377.0 (H) 08/15/2020 1526   HDL 23.50 (L) 08/15/2020 1526   CHOLHDL 6 08/15/2020 1526   VLDL 75.4 (H) 08/15/2020 1526   LDLCALC UNABLE TO CALCULATE IF TRIGLYCERIDE OVER 400 mg/dL 13/12/6576 4696   LDLDIRECT 68.0 08/15/2020 1526     Physical Exam:    VS:  BP 134/90   Pulse (!) 109   Ht  (1.88 m)   Wt 264 lb 6.4 oz (119.9 kg)   SpO2 99%   BMI 33.95 kg/m     Wt Readings from Last 3 Encounters:  10/10/20 264 lb 6.4 oz (119.9 kg)  09/12/20 270 lb (122.5 kg)  09/12/20 253 lb 15.5 oz (115.2 kg)     GEN:  Well nourished, well developed in no acute distress HEENT: Normal NECK: No JVD; No carotid bruits CARDIAC: RRR, no murmurs, rubs, gallops RESPIRATORY:  Clear to auscultation without rales, wheezing or rhonchi  ABDOMEN: Soft, non-tender,  non-distended MUSCULOSKELETAL:  No edema; warm  SKIN: Warm and dry NEUROLOGIC:  AAOx3 PSYCHIATRIC:  Normal affect, more interactive today  ASSESSMENT:    1. Coronary artery disease involving  native coronary artery of native heart without angina pectoris   2. Back pain, unspecified back location, unspecified back pain laterality, unspecified chronicity   3. Essential hypertension   4. Type 2 diabetes mellitus with hyperglycemia, without long-term current use of insulin (HCC)   5. Generalized anxiety disorder   6. OSA (obstructive sleep apnea)   7. Obesity (BMI 30.0-34.9)   8. Chronic systolic heart failure (HCC)   9. Ischemic cardiomyopathy    PLAN:    In order of problems listed above:  #NSTEMI: #Multivessel CAD: [facility] with significant chest pain found to have elevated troponin c/w NSTEMI. Underwent coronary angiography which revealed99% proximal LADand90% distal RCAboth of which were treated successfuly with DES. LV gram with LVEF 25-30%. Unfortunately, patient left AMA overnight without optimization of medications.Now more compliant although appears he is stil intermittnetly missing doses of medications or not picking them up at his pharmacy (states he is taking everything but imdur currently). Fortunately, he has followed up at all appointments and attended Cardiac Rehab. He reports compliance with all medications. -TTE with recovered EF 65-70%, no significant WMA -Continue ASA and brilinta -Continue lisinopril20mg  daily -Increasecoreg to 25mg  BID -Continuelipitor to 40mg  daily -Continue spironolactone 12.5mg  daily -Continue jardiance 10mg  daily -Okay to resume cardiac rehab -Tobacco cessation counseling provided extensively today; patient states he wants to try to cut back -Counseled about diet and exercise -Needs continued encouragement about medication compliance  #Ischemic Cardiomyopathy with LVEF 25-30%: #Chronic Systolic heart  faillure EF approximately 25-30% on LV gram-->now improved to 65-70% on TTE on 08/12/20. Euvolemic on examination. Currently with NYHA class II symptoms.  -TTE with recovered EF 65-70% -Continue lisinopril 20mg  daily -Increase coreg to 25mg  BID as above -Continue spironolactone 12.5mg  daily and jardiance 10mg  daily as above -Low Na diet  #Palpitations: Improved. Cardiac monitor without significant ectopy. Rare PVCs. -Increase coreg to 25mg  BID  #DMII: A1C 12.6%. Now referred to Endocrine and reports compliance with insulin. -Follow-up with Endocrinology as scheduled -Continue metformin 1000mg  daily -Continue jardiance 25mg  daily  #OSA not on CPAP: -Follow-up with Pulm  #Tobacco abuse: -Cessation counseling -Continue nicotine patches -Needs extensive counseling going forward  #Schoizoaffective disorder: #Depression: -Management per psych  #Low Back Pain: Sounds like he is suffering with sciatica. Asked about referral to pain management but recommend evaluation by Sports Medicine first. Not a good candidate for narcotics. -Refer to Dr. Katrinka BlazingSmith with Sport Medicine  OKAY TO RESUME CARDIAC REHAB.  Medication Adjustments/Labs and Tests Ordered: Current medicines are reviewed at length with the patient today.  Concerns regarding medicines are outlined above.  Orders Placed This Encounter  Procedures  . AMB referral to sports medicine  . EKG 12-Lead   Meds ordered this encounter  Medications  . carvedilol (COREG) 25 MG tablet    Sig: Take 1 tablet (25 mg total) by mouth 2 (two) times daily.    Dispense:  180 tablet    Refill:  3    Dose increase  . empagliflozin (JARDIANCE) 25 MG TABS tablet    Sig: Take 1 tablet (25 mg total) by mouth daily before breakfast.    Dispense:  90 tablet    Refill:  2    Patient Instructions  Medication Instructions:   INCREASE YOUR CARVEDILOL TO 25 MG BY MOUTH TWICE DAILY  *If you need a refill on your cardiac medications before  your next appointment, please call your pharmacy*   You have been referred to [facility] MEDICINE [facility]   Follow-Up: At [facility], you and your health needs are our priority.  As part of our continuing mission to provide you with exceptional heart care, we have created designated Provider Care Teams.  These Care Teams include your primary Cardiologist (physician) and Advanced Practice Providers (APPs -  Physician Assistants and Nurse Practitioners) who all work together to provide you with the care you need, when you need it.  We recommend signing up for the patient portal called "MyChart".  Sign up information is provided on this After Visit Summary.  MyChart is used to connect with patients for Virtual Visits (Telemedicine).  Patients are able to view lab/test results, encounter notes, upcoming appointments, etc.  Non-urgent messages can be sent to your provider as well.   To learn more about what you can do with MyChart, go to [email or web address].    Your next appointment:   8 month(s)  The format for your next appointment:   In Person  Provider:   You will see one of the following Advanced Practice Providers on your designated Care Team:    Tereso Newcomer, PA-C  Vin Bhagat, PA-C  DAYNA DUNN PA-C   MICHELE LENZE PA-C  Nada Boozer NP  JILL MCDANIEL NP   Other Instructions  YOU ARE CLEARED FOR CARDIAC REHAB PER DR. Shari Prows     I,Stephanie Williams,acting as a scribe for Meriam Sprague, MD.,have documented all relevant documentation on the behalf of Meriam Sprague, MD,as directed by  Meriam Sprague, MD while in the presence of Meriam Sprague, MD.  I, Meriam Sprague, MD, have reviewed all documentation for this visit. The documentation on 10/10/20 for the exam, diagnosis, procedures, and orders are all accurate and complete.  Signed, Meriam Sprague, MD  10/10/2020 10:35 AM    [facility]

## 2020-10-10 NOTE — Patient Instructions (Signed)
Medication Instructions:   INCREASE YOUR CARVEDILOL TO 25 MG BY MOUTH TWICE DAILY  *If you need a refill on your cardiac medications before your next appointment, please call your pharmacy*   You have been referred to SPORTS MEDICINE TO SEE [facility] FOR BACK PAIN   Follow-Up: At [facility], you and your health needs are our priority.  As part of our continuing mission to provide you with exceptional heart care, we have created designated Provider Care Teams.  These Care Teams include your primary Cardiologist (physician) and Advanced Practice Providers (APPs -  Physician Assistants and Nurse Practitioners) who all work together to provide you with the care you need, when you need it.  We recommend signing up for the patient portal called "MyChart".  Sign up information is provided on this After Visit Summary.  MyChart is used to connect with patients for Virtual Visits (Telemedicine).  Patients are able to view lab/test results, encounter notes, upcoming appointments, etc.  Non-urgent messages can be sent to your provider as well.   To learn more about what you can do with MyChart, go to [email or web address].    Your next appointment:   8 month(s)  The format for your next appointment:   In Person  Provider:   You will see one of the following Advanced Practice Providers on your designated Care Team:    Tereso Newcomer, PA-C  Vin Bhagat, PA-C  DAYNA DUNN PA-C   MICHELE LENZE PA-C  Nada Boozer NP  JILL MCDANIEL NP   Other Instructions  YOU ARE CLEARED FOR CARDIAC REHAB PER DR. Shari Prows

## 2020-10-10 NOTE — Telephone Encounter (Signed)
Pt saw Dr. Shari Prows in clinic today.  See OV note for further details.  Per Dr. Shari Prows, he is cleared to return back to cardiac rehab.

## 2020-10-12 ENCOUNTER — Telehealth: Payer: Self-pay | Admitting: *Deleted

## 2020-10-12 ENCOUNTER — Encounter (HOSPITAL_COMMUNITY): Payer: Medicaid Other

## 2020-10-12 NOTE — Telephone Encounter (Signed)
Referral Received: Today Janett Billow, LPN Per Judee Clara with Sports medicine - We do not participate with [facility].

## 2020-10-13 ENCOUNTER — Emergency Department (HOSPITAL_COMMUNITY): Payer: Medicaid Other

## 2020-10-13 ENCOUNTER — Emergency Department (HOSPITAL_COMMUNITY)
Admission: EM | Admit: 2020-10-13 | Discharge: 2020-10-13 | Disposition: A | Discharge status: Home / Self Care | Payer: Medicaid Other | Attending: Emergency Medicine | Admitting: Emergency Medicine

## 2020-10-13 DIAGNOSIS — I10 Essential (primary) hypertension: Secondary | ICD-10-CM | POA: Insufficient documentation

## 2020-10-13 DIAGNOSIS — Z7982 Long term (current) use of aspirin: Secondary | ICD-10-CM | POA: Diagnosis not present

## 2020-10-13 DIAGNOSIS — Z794 Long term (current) use of insulin: Secondary | ICD-10-CM | POA: Insufficient documentation

## 2020-10-13 DIAGNOSIS — M25572 Pain in left ankle and joints of left foot: Secondary | ICD-10-CM | POA: Diagnosis not present

## 2020-10-13 DIAGNOSIS — M25571 Pain in right ankle and joints of right foot: Secondary | ICD-10-CM | POA: Insufficient documentation

## 2020-10-13 DIAGNOSIS — I25119 Atherosclerotic heart disease of native coronary artery with unspecified angina pectoris: Secondary | ICD-10-CM | POA: Insufficient documentation

## 2020-10-13 DIAGNOSIS — R079 Chest pain, unspecified: Secondary | ICD-10-CM | POA: Insufficient documentation

## 2020-10-13 DIAGNOSIS — M25562 Pain in left knee: Secondary | ICD-10-CM | POA: Diagnosis not present

## 2020-10-13 DIAGNOSIS — M25561 Pain in right knee: Secondary | ICD-10-CM | POA: Diagnosis not present

## 2020-10-13 DIAGNOSIS — F17290 Nicotine dependence, other tobacco product, uncomplicated: Secondary | ICD-10-CM | POA: Insufficient documentation

## 2020-10-13 DIAGNOSIS — E119 Type 2 diabetes mellitus without complications: Secondary | ICD-10-CM | POA: Insufficient documentation

## 2020-10-13 DIAGNOSIS — M545 Low back pain, unspecified: Secondary | ICD-10-CM | POA: Diagnosis not present

## 2020-10-13 DIAGNOSIS — Z7984 Long term (current) use of oral hypoglycemic drugs: Secondary | ICD-10-CM | POA: Insufficient documentation

## 2020-10-13 DIAGNOSIS — Z20822 Contact with and (suspected) exposure to covid-19: Secondary | ICD-10-CM | POA: Diagnosis not present

## 2020-10-13 LAB — CBC
HCT: 50.2 % (ref 39.0–52.0)
Hemoglobin: 16.7 g/dL (ref 13.0–17.0)
MCH: 28.4 pg (ref 26.0–34.0)
MCHC: 33.3 g/dL (ref 30.0–36.0)
MCV: 85.5 fL (ref 80.0–100.0)
Platelets: 319 10*3/uL (ref 150–400)
RBC: 5.87 MIL/uL — ABNORMAL HIGH (ref 4.22–5.81)
RDW: 13.9 % (ref 11.5–15.5)
WBC: 17.9 10*3/uL — ABNORMAL HIGH (ref 4.0–10.5)
nRBC: 0 % (ref 0.0–0.2)

## 2020-10-13 LAB — BASIC METABOLIC PANEL
Anion gap: 13 (ref 5–15)
BUN: 10 mg/dL (ref 6–20)
CO2: 20 mmol/L — ABNORMAL LOW (ref 22–32)
Calcium: 9.9 mg/dL (ref 8.9–10.3)
Chloride: 99 mmol/L (ref 98–111)
Creatinine, Ser: 1.03 mg/dL (ref 0.61–1.24)
GFR, Estimated: >60 mL/min (ref >60)
Glucose, Bld: 109 mg/dL — ABNORMAL HIGH (ref 70–99)
Potassium: 4.3 mmol/L (ref 3.5–5.1)
Sodium: 132 mmol/L — ABNORMAL LOW (ref 135–145)

## 2020-10-13 LAB — TROPONIN I (HIGH SENSITIVITY)
Troponin I (High Sensitivity): 5 ng/L (ref <18)
Troponin I (High Sensitivity): 4 ng/L (ref <18)

## 2020-10-13 LAB — SARS CORONAVIRUS 2 (TAT 6-24 HRS): SARS Coronavirus 2: NEGATIVE

## 2020-10-13 LAB — POC SARS CORONAVIRUS 2 AG -  ED: SARSCOV2ONAVIRUS 2 AG: NEGATIVE

## 2020-10-13 IMAGING — DX DG CHEST 2V
2 series · 2 of 2 positions shown · non-contrast
Comparison: [DATE]

CLINICAL DATA: Right-sided chest pain beginning yesterday.

EXAM:
CHEST - 2 VIEW

[chest pa]
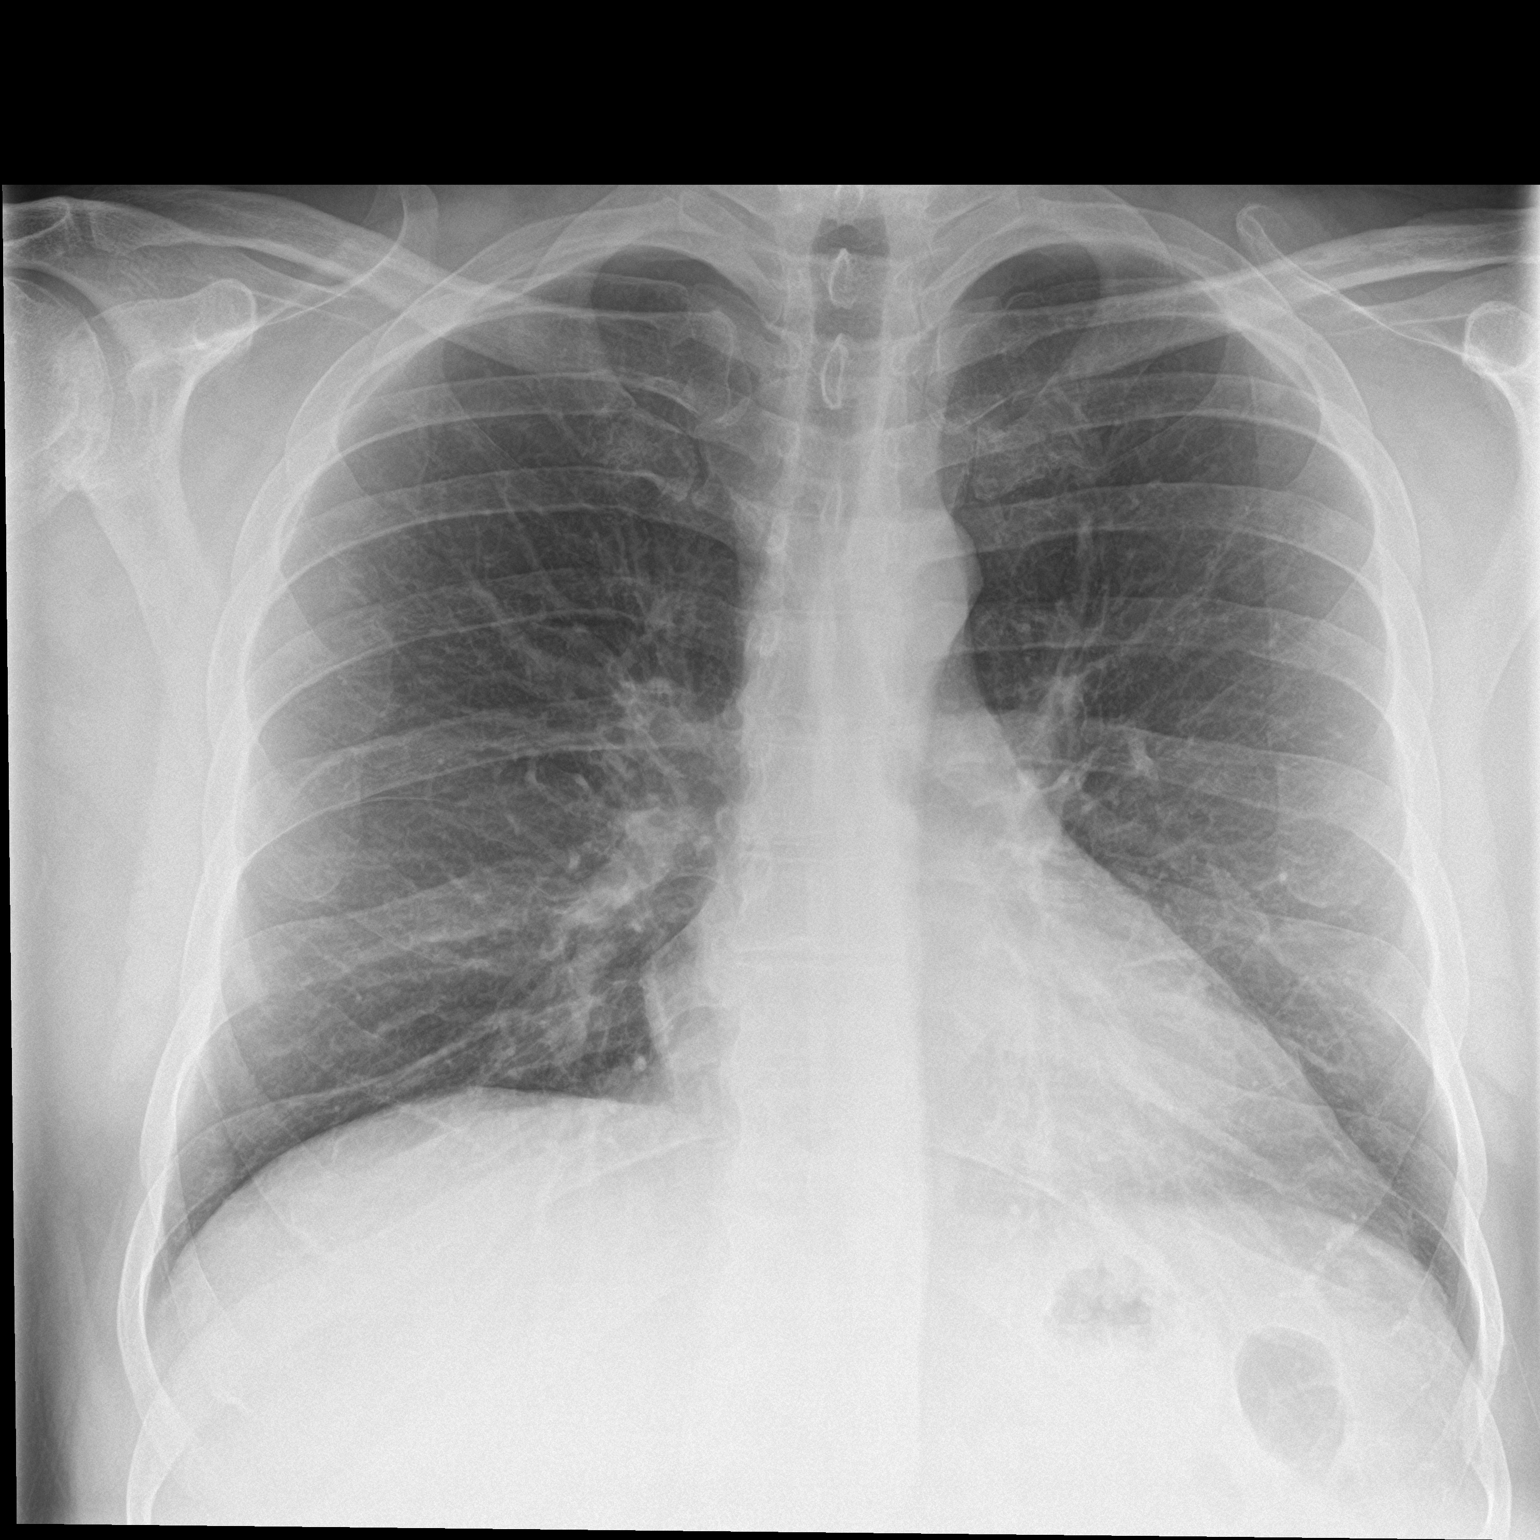

[chest lat]
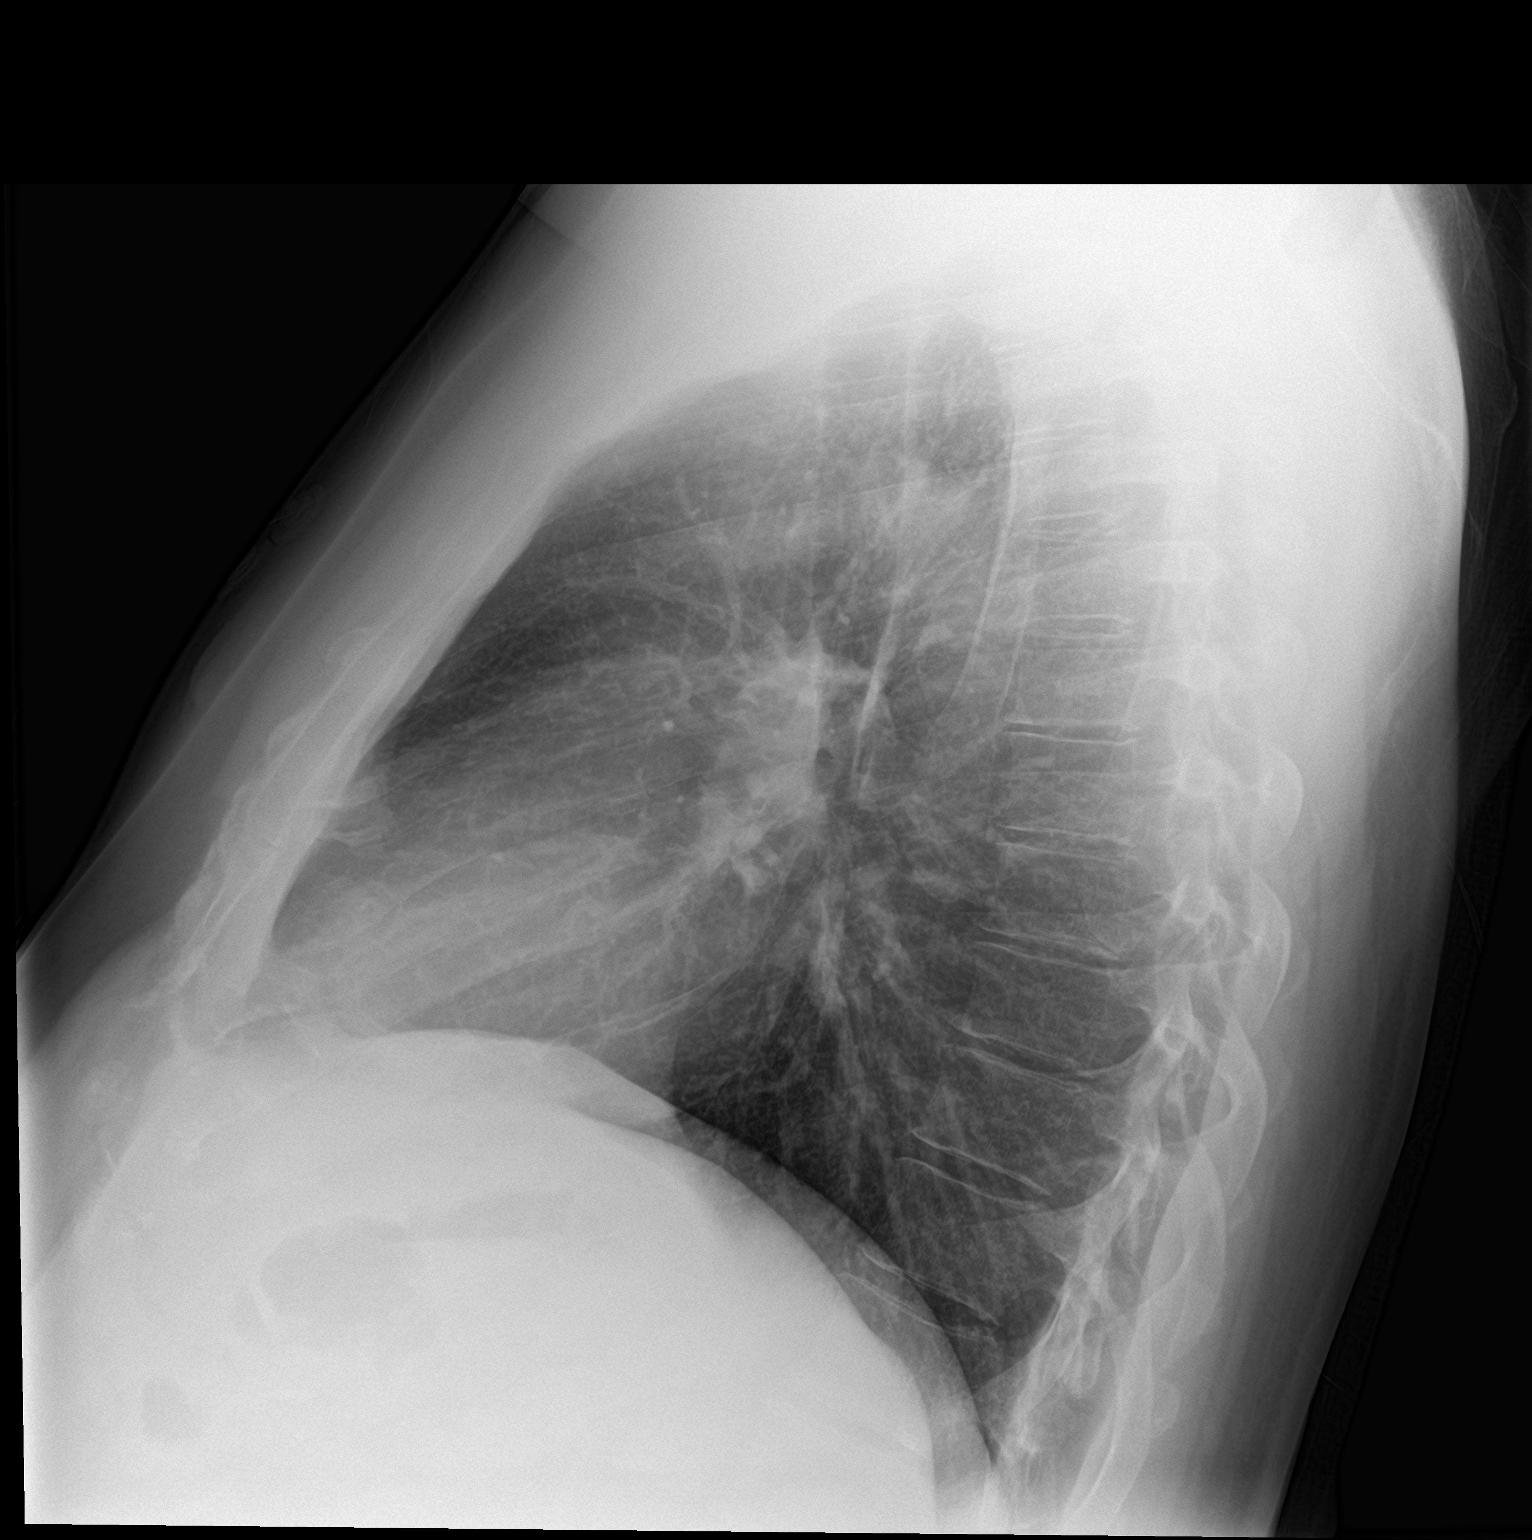

[2 of 2 positions shown; findings below may reference images not displayed]

FINDINGS: Heart size is normal. Mediastinal shadows are normal. The lungs are
clear. No bronchial thickening. No infiltrate, mass, effusion or
collapse. Pulmonary vascularity is normal. No bony abnormality.
IMPRESSION: Normal chest

## 2020-10-13 MED ORDER — LORAZEPAM 1 MG PO TABS
0.5000 mg | ORAL_TABLET | Freq: Once | ORAL | Status: AC
  Administered 2020-10-13: 0.5 mg via ORAL

## 2020-10-13 MED ORDER — KETOROLAC TROMETHAMINE 15 MG/ML IJ SOLN
15.0000 mg | Freq: Once | INTRAMUSCULAR | Status: AC
  Administered 2020-10-13: 15 mg via INTRAVENOUS
  Filled 2020-10-13: qty 1

## 2020-10-13 MED ORDER — SODIUM CHLORIDE 0.9 % IV BOLUS
1000.0000 mL | Freq: Once | INTRAVENOUS | Status: AC
  Administered 2020-10-13: 1000 mL via INTRAVENOUS

## 2020-10-13 MED ORDER — LORAZEPAM 1 MG PO TABS
0.5000 mg | ORAL_TABLET | Freq: Once | ORAL | Status: AC
  Administered 2020-10-13: 0.5 mg via ORAL
  Filled 2020-10-13: qty 1

## 2020-10-13 MED ORDER — FENTANYL CITRATE (PF) 100 MCG/2ML IJ SOLN
50.0000 ug | Freq: Once | INTRAMUSCULAR | Status: AC
  Administered 2020-10-13: 50 ug via INTRAVENOUS
  Filled 2020-10-13: qty 2

## 2020-10-13 NOTE — ED Notes (Signed)
Pt given Diet coke for fluid challenge. Pt tolerating well. Will continue to monitor.   

## 2020-10-13 NOTE — Discharge Instructions (Signed)
Please use Tylenol 1000 mg and ibuprofen 600 mg for your pain.  You may alternate between the 2 of these medications.  Please do not take more than this.  Please drink plenty of water.  Please follow-up with your primary care doctor and your cardiologist within the next week.  Return to the ER for any new or concerning symptoms.

## 2020-10-13 NOTE — ED Provider Notes (Signed)
[name] EMERGENCY DEPARTMENT Provider Note   CSN: [id number] Arrival date & time: 10/13/20  1014     History Chief Complaint  Patient presents with  . Chest Pain  . Generalized Body Aches    Matthew [name] is a 45 y.o. male.  HPI Patient is a 45 year old male with a past medical history significant for anxiety, celiac disease, CAD s/p stent after NSTEMI in January '22, DM 2, paranoid schizophrenia, HLD  Patient presented to the ER with complaints of generalized body aches worse in his low back knees and ankles, he states that these symptoms been ongoing for 2 days.  He states he is not coughing more than he normally does.  He still smokes 2 packs a day and states that he has not had any new productive sputum.  He states up yesterday evening he started however occasional intermittent sharp chest pains.  He states that the pain "radiates everywhere "he states that he also has some nausea and 2 episodes of vomiting this morning.  Denies any fevers at home.  States he has had some mild chills.  Denies any sinus congestion or sore throat.  No recent surgeries, long travel, hemoptysis, estrogen containing OCP, cancer history.  No unilateral leg swelling.  No history of PE or VTE.     Past Medical History:  Diagnosis Date  . Anxiety   . Celiac disease   . Coronary artery disease   . Dairy allergy   . Depression   . Diabetes mellitus without complication (HCC)   . High cholesterol   . Hypertension   . Paranoia (HCC)   . Pre-diabetes   . Schizoaffective disorder [facility])     Patient Active Problem List   Diagnosis Date Noted  . Right-sided chest pain 09/11/2020  . Coronary artery disease of bypass graft of native heart with stable angina pectoris (HCC)   . Uncontrolled type 2 diabetes mellitus with hyperglycemia (HCC)   . Hyponatremia   . Essential hypertension   . History of hyperprolactinemia 08/16/2020  . Mixed dyslipidemia 08/15/2020  . Chest pain  06/06/2020  . Type 2 diabetes mellitus with hyperglycemia (HCC) 06/06/2020  . Hyperkalemia 06/06/2020  . Thrombocytopenia (HCC) 06/06/2020  . Obesity (BMI 30.0-34.9) 06/06/2020  . Non-ST elevation (NSTEMI) myocardial infarction (HCC)   . Attention deficit hyperactivity disorder (ADHD), combined type, moderate 03/10/2018  . Generalized anxiety disorder 03/10/2018  . Schizoaffective disorder, bipolar type without good prognostic features (HCC) 02/09/2015  . Morbid obesity (HCC) 01/18/2015  . OSA (obstructive sleep apnea) 07/14/2014  . Solitary pulmonary nodule 07/14/2014    Past Surgical History:  Procedure Laterality Date  . APPENDECTOMY    . BRAIN SURGERY    . CARDIAC CATHETERIZATION    . CORONARY STENT INTERVENTION N/A 06/06/2020   Procedure: CORONARY STENT INTERVENTION;  Surgeon: Swaziland, Peter M, MD;  Location: [facility] INVASIVE CV LAB;  Service: Cardiovascular;  Laterality: N/A;  prox LAD, distal RCA  . INTRAVASCULAR ULTRASOUND/IVUS N/A 06/06/2020   Procedure: Intravascular Ultrasound/IVUS;  Surgeon: Swaziland, Peter M, MD;  Location: [facility] INVASIVE CV LAB;  Service: Cardiovascular;  Laterality: N/A;  . LEFT HEART CATH AND CORONARY ANGIOGRAPHY N/A 06/06/2020   Procedure: LEFT HEART CATH AND CORONARY ANGIOGRAPHY;  Surgeon: Swaziland, Peter M, MD;  Location: [facility] INVASIVE CV LAB;  Service: Cardiovascular;  Laterality: N/A;  . NASAL SEPTUM SURGERY    . WRIST FRACTURE SURGERY     right wrist       Family History  Problem Relation Age of Onset  . Asthma Mother   . Hypertension Mother   . Hypertension Father     Social History   Tobacco Use  . Smoking status: Heavy Tobacco Smoker    Packs/day: 1.00    Types: Cigarettes  . Smokeless tobacco: Never Used  . Tobacco comment: patient given phone number to 1-800-quit now on 08/16/20  Vaping Use  . Vaping Use: Never used  Substance Use Topics  . Alcohol use: No    Alcohol/week: 0.0 standard drinks    Comment: no (02/08/15)  . Drug use: No     Home Medications Prior to Admission medications   Medication Sig Start Date End Date Taking? Authorizing Provider  aspirin EC 81 MG tablet Take 1 tablet (81 mg total) by mouth daily. Swallow whole. 06/07/20  Yes Meriam Sprague, MD  atorvastatin (LIPITOR) 40 MG tablet Take 40 mg by mouth daily.   Yes [provider]  carvedilol (COREG) 25 MG tablet Take 1 tablet (25 mg total) by mouth 2 (two) times daily. 10/10/20  Yes Meriam Sprague, MD  clonazePAM (KLONOPIN) 1 MG tablet Take 1 tablet (1 mg total) by mouth 3 (three) times daily as needed for anxiety. 10/14/20  Yes Hurst, Glade Nurse, PA-C  DULoxetine (CYMBALTA) 60 MG capsule Take 1 capsule (60 mg total) by mouth 2 (two) times daily. 09/08/20  Yes Hurst, Teresa T, PA-C  empagliflozin (JARDIANCE) 25 MG TABS tablet Take 1 tablet (25 mg total) by mouth daily before breakfast. 10/10/20  Yes Pemberton, Kathlynn Grate, MD  insulin degludec (TRESIBA FLEXTOUCH) 100 UNIT/ML FlexTouch Pen Inject 16 Units into the skin daily. 08/26/20  Yes Shamleffer, Konrad Dolores, MD  isosorbide mononitrate (IMDUR) 30 MG 24 hr tablet Take 0.5 tablets (15 mg total) by mouth daily. 08/10/20  Yes Wynetta Fines, MD  lisinopril (ZESTRIL) 20 MG tablet Take 1 tablet (20 mg total) by mouth daily. 06/08/20  Yes Dyann Kief, PA-C  lithium carbonate (LITHOBID) 300 MG CR tablet TAKE 2 TABLETS (600 MG TOTAL) BY MOUTH AT BEDTIME. 03/29/20  Yes Chauncey Mann, MD  metFORMIN (GLUCOPHAGE) 1000 MG tablet Take 1 tablet (1,000 mg total) by mouth 2 (two) times daily with a meal. 08/15/20  Yes Shamleffer, Konrad Dolores, MD  nitroGLYCERIN (NITROSTAT) 0.4 MG SL tablet Place 1 tablet (0.4 mg total) under the tongue every 5 (five) minutes as needed for chest pain. 08/10/20 08/10/21 Yes Georgie Chard D, NP  perphenazine (TRILAFON) 16 MG tablet TAKE 1 PILL TOTAL 16 MG EVERY MORNING AND TAKE 2 PILLS BY MOUTH TOTAL 32 MG EVERY BEDTIME Patient taking differently: Take 16-32 mg by mouth  See admin instructions. Taking 1 tab (16 mg) in the AM and 2 tabs (32 mg) at bedtime 10/07/20  Yes Hurst, Teresa T, PA-C  pregabalin (LYRICA) 100 MG capsule Take 100 mg by mouth 2 (two) times daily.   Yes [provider]  spironolactone (ALDACTONE) 25 MG tablet Take 0.5 tablets (12.5 mg total) by mouth daily. 07/20/20 07/15/21 Yes Meriam Sprague, MD  ticagrelor (BRILINTA) 90 MG TABS tablet Take 1 tablet (90 mg total) by mouth 2 (two) times daily. 06/07/20  Yes Meriam Sprague, MD  traZODone (DESYREL) 100 MG tablet TAKE 3 TABLETS BY MOUTH EVERY DAY AT BEDTIME AS NEEDED FOR SLEEP Patient taking differently: Take 300 mg by mouth at bedtime as needed for sleep. 09/08/20  Yes Hurst, Rosey Bath T, PA-C  gabapentin (NEURONTIN) 800 MG tablet 1 po q  am, 1/2 po at noon, 1 po at 4 pm, and 1 po at bedtime. Patient not taking: No sig reported 10/03/20   Melony OverlyHurst, Teresa T, PA-C  Insulin Pen Needle 32G X 4 MM MISC 1 Device by Does not apply route daily. 08/15/20   Shamleffer, Konrad DoloresIbtehal Jaralla, MD    Allergies    Gluten meal  Review of Systems   Review of Systems  Constitutional: Negative for chills and fever.  HENT: Negative for congestion.   Eyes: Negative for pain.  Respiratory: Negative for cough and shortness of breath.   Cardiovascular: Positive for chest pain. Negative for leg swelling.  Gastrointestinal: Negative for abdominal pain, diarrhea, nausea and vomiting.  Genitourinary: Negative for dysuria.  Musculoskeletal: Negative for myalgias.  Skin: Negative for rash.  Neurological: Negative for dizziness and headaches.    Physical Exam Updated Vital Signs BP (!) 131/93 (BP Location: Right Arm)   Pulse 99   Temp 98.1 F (36.7 C) (Oral)   Resp 14   Ht 6\' 2"  (1.88 m)   Wt 122.5 kg   SpO2 96%   BMI 34.67 kg/m   Physical Exam Vitals and nursing note reviewed.  Constitutional:      General: He is not in acute distress.    Comments: Pleasant well-appearing 45 year old.  In no acute  distress but somewhat anxious appearing.  Able answer questions appropriately follow commands. No increased work of breathing. Speaking in full sentences.  HENT:     Head: Normocephalic and atraumatic.     Nose: Nose normal.     Mouth/Throat:     Mouth: Mucous membranes are dry.  Eyes:     General: No scleral icterus. Cardiovascular:     Rate and Rhythm: Normal rate and regular rhythm.     Pulses: Normal pulses.     Heart sounds: Normal heart sounds.  Pulmonary:     Effort: Pulmonary effort is normal. No respiratory distress.     Breath sounds: No wheezing.  Abdominal:     Palpations: Abdomen is soft.     Tenderness: There is no abdominal tenderness. There is no right CVA tenderness, left CVA tenderness, guarding or rebound.  Musculoskeletal:     Cervical back: Normal range of motion.     Right lower leg: No edema.     Left lower leg: No edema.  Skin:    General: Skin is warm and dry.     Capillary Refill: Capillary refill takes less than 2 seconds.  Neurological:     Mental Status: He is alert. Mental status is at baseline.  Psychiatric:     Comments: Visibly anxious     ED Results / Procedures / Treatments   Labs (all labs ordered are listed, but only abnormal results are displayed) Labs Reviewed  BASIC METABOLIC PANEL - Abnormal; Notable for the following components:      Result Value   Sodium 132 (*)    CO2 20 (*)    Glucose, Bld 109 (*)    All other components within normal limits  CBC - Abnormal; Notable for the following components:   WBC 17.9 (*)    RBC 5.87 (*)    All other components within normal limits  SARS CORONAVIRUS 2 (TAT 6-24 HRS)  POC SARS CORONAVIRUS 2 AG -  ED  TROPONIN I (HIGH SENSITIVITY)  TROPONIN I (HIGH SENSITIVITY)    EKG None  Radiology DG Chest 2 View  Result Date: 10/13/2020 CLINICAL DATA:  Right-sided chest pain beginning yesterday.  EXAM: CHEST - 2 VIEW COMPARISON:  09/14/2020 FINDINGS: Heart size is normal. Mediastinal shadows  are normal. The lungs are clear. No bronchial thickening. No infiltrate, mass, effusion or collapse. Pulmonary vascularity is normal. No bony abnormality. IMPRESSION: Normal chest Electronically Signed   By: Paulina Fusi M.D.   On: 10/13/2020 11:18    Procedures Procedures   Medications Ordered in ED Medications  sodium chloride 0.9 % bolus 1,000 mL (0 mLs Intravenous Stopped 10/13/20 1526)  LORazepam (ATIVAN) tablet 0.5 mg (0.5 mg Oral Given 10/13/20 1254)  ketorolac (TORADOL) 15 MG/ML injection 15 mg (15 mg Intravenous Given 10/13/20 1308)  LORazepam (ATIVAN) tablet 0.5 mg (0.5 mg Oral Given 10/13/20 1302)  fentaNYL (SUBLIMAZE) injection 50 mcg (50 mcg Intravenous Given 10/13/20 1340)    ED Course  I have reviewed the triage vital signs and the nursing notes.  Pertinent labs & imaging results that were available during my care of the patient were reviewed by me and considered in my medical decision making (see chart for details).    MDM Rules/Calculators/A&P                          Patient is a 45 year old male with past medical history detailed in HPI notably patient has a history of CAD, DM 2, HLD, HTN, paranoia, schizoaffective disorder anxiety  He has been in the ER for chest pain several times this past week since that this is his fifth visit in the past week for similar symptoms.  He has had reassuring work-up so far.  During his prior evaluations. Patient is followed by cardiology he has had some issues with compliance and showing up for visits.  CBC with stable leukocytosis erythrocytosis likely secondary to patient's heavy smoking.  BMP unremarkable and troponin x2 within normal limits.  COVID test is negative.  EKG with no changes from prior is not a normal EKG however there is no new ST-T wave abnormalities.  He does have sinus tachycardia today.  Suspect dehydration and discomfort anxiety.  We will treat these and reassess.  Reviewed by attending physician myself.  Chest  x-ray unremarkable.   Patient reevaluated after low-dose of Ativan, fluids and Toradol patient's tachycardia has resolved.  Patient has close follow-up with cardiology.  He will call tomorrow to make an appointment.  He understands return precautions.  Will discharge home at this time.   Final Clinical Impression(s) / ED Diagnoses Final diagnoses:  Chest pain, unspecified type    Rx / DC Orders ED Discharge Orders    None       Gailen Shelter, [place] 10/14/20 1600    Derwood Kaplan, MD 10/14/20 1709

## 2020-10-13 NOTE — ED Triage Notes (Signed)
Pt reports shooting chest pains since last night on right side. Denies SOB. Pt reports weakness and sweats with the pain. Pt reports current smoker, with cough.

## 2020-10-13 NOTE — ED Notes (Signed)
Pt verbalizes understanding of d/c instructions. Pt ambulatory at d/c with all belonging.

## 2020-10-14 ENCOUNTER — Encounter (HOSPITAL_COMMUNITY): Payer: Medicaid Other

## 2020-10-27 ENCOUNTER — Ambulatory Visit: Payer: Medicaid Other | Admitting: Dietician

## 2020-11-08 ENCOUNTER — Other Ambulatory Visit: Payer: Self-pay

## 2020-11-08 ENCOUNTER — Emergency Department (HOSPITAL_COMMUNITY): Payer: Medicaid Other

## 2020-11-08 ENCOUNTER — Emergency Department (HOSPITAL_COMMUNITY)
Admission: EM | Admit: 2020-11-08 | Discharge: 2020-11-08 | Disposition: A | Discharge status: Home / Self Care | Payer: Medicaid Other | Attending: Emergency Medicine | Admitting: Emergency Medicine

## 2020-11-08 DIAGNOSIS — R0789 Other chest pain: Secondary | ICD-10-CM | POA: Diagnosis present

## 2020-11-08 DIAGNOSIS — F419 Anxiety disorder, unspecified: Secondary | ICD-10-CM | POA: Diagnosis not present

## 2020-11-08 DIAGNOSIS — Z7984 Long term (current) use of oral hypoglycemic drugs: Secondary | ICD-10-CM | POA: Diagnosis not present

## 2020-11-08 DIAGNOSIS — E119 Type 2 diabetes mellitus without complications: Secondary | ICD-10-CM | POA: Diagnosis not present

## 2020-11-08 DIAGNOSIS — Z7982 Long term (current) use of aspirin: Secondary | ICD-10-CM | POA: Diagnosis not present

## 2020-11-08 DIAGNOSIS — Z79899 Other long term (current) drug therapy: Secondary | ICD-10-CM | POA: Insufficient documentation

## 2020-11-08 DIAGNOSIS — I10 Essential (primary) hypertension: Secondary | ICD-10-CM | POA: Insufficient documentation

## 2020-11-08 DIAGNOSIS — R079 Chest pain, unspecified: Secondary | ICD-10-CM

## 2020-11-08 DIAGNOSIS — I25119 Atherosclerotic heart disease of native coronary artery with unspecified angina pectoris: Secondary | ICD-10-CM | POA: Diagnosis not present

## 2020-11-08 DIAGNOSIS — Z955 Presence of coronary angioplasty implant and graft: Secondary | ICD-10-CM | POA: Diagnosis not present

## 2020-11-08 DIAGNOSIS — F17210 Nicotine dependence, cigarettes, uncomplicated: Secondary | ICD-10-CM | POA: Diagnosis not present

## 2020-11-08 DIAGNOSIS — R000 Tachycardia, unspecified: Secondary | ICD-10-CM | POA: Insufficient documentation

## 2020-11-08 DIAGNOSIS — Z794 Long term (current) use of insulin: Secondary | ICD-10-CM | POA: Diagnosis not present

## 2020-11-08 DIAGNOSIS — R61 Generalized hyperhidrosis: Secondary | ICD-10-CM | POA: Diagnosis not present

## 2020-11-08 DIAGNOSIS — R0602 Shortness of breath: Secondary | ICD-10-CM | POA: Diagnosis not present

## 2020-11-08 DIAGNOSIS — R42 Dizziness and giddiness: Secondary | ICD-10-CM | POA: Diagnosis not present

## 2020-11-08 LAB — BASIC METABOLIC PANEL
Anion gap: 12 (ref 5–15)
BUN: 12 mg/dL (ref 6–20)
CO2: 21 mmol/L — ABNORMAL LOW (ref 22–32)
Calcium: 9.8 mg/dL (ref 8.9–10.3)
Chloride: 99 mmol/L (ref 98–111)
Creatinine, Ser: 1.01 mg/dL (ref 0.61–1.24)
GFR, Estimated: >60 mL/min (ref >60)
Glucose, Bld: 151 mg/dL — ABNORMAL HIGH (ref 70–99)
Potassium: 4.1 mmol/L (ref 3.5–5.1)
Sodium: 132 mmol/L — ABNORMAL LOW (ref 135–145)

## 2020-11-08 LAB — CBC
HCT: 48.1 % (ref 39.0–52.0)
Hemoglobin: 16.3 g/dL (ref 13.0–17.0)
MCH: 28.8 pg (ref 26.0–34.0)
MCHC: 33.9 g/dL (ref 30.0–36.0)
MCV: 85.1 fL (ref 80.0–100.0)
Platelets: 265 10*3/uL (ref 150–400)
RBC: 5.65 MIL/uL (ref 4.22–5.81)
RDW: 14.7 % (ref 11.5–15.5)
WBC: 15.7 10*3/uL — ABNORMAL HIGH (ref 4.0–10.5)
nRBC: 0 % (ref 0.0–0.2)

## 2020-11-08 LAB — PROTIME-INR
INR: 0.8 (ref 0.8–1.2)
Prothrombin Time: 11.4 seconds (ref 11.4–15.2)

## 2020-11-08 LAB — CBG MONITORING, ED: Glucose-Capillary: 169 mg/dL — ABNORMAL HIGH (ref 70–99)

## 2020-11-08 LAB — TROPONIN I (HIGH SENSITIVITY)
Troponin I (High Sensitivity): 4 ng/L (ref <18)
Troponin I (High Sensitivity): 4 ng/L (ref <18)

## 2020-11-08 IMAGING — DX DG CHEST 2V
2 series · 2 of 2 positions shown · non-contrast
Comparison: [DATE]

CLINICAL DATA: Chest pain

EXAM:
CHEST - 2 VIEW

[chest pa]
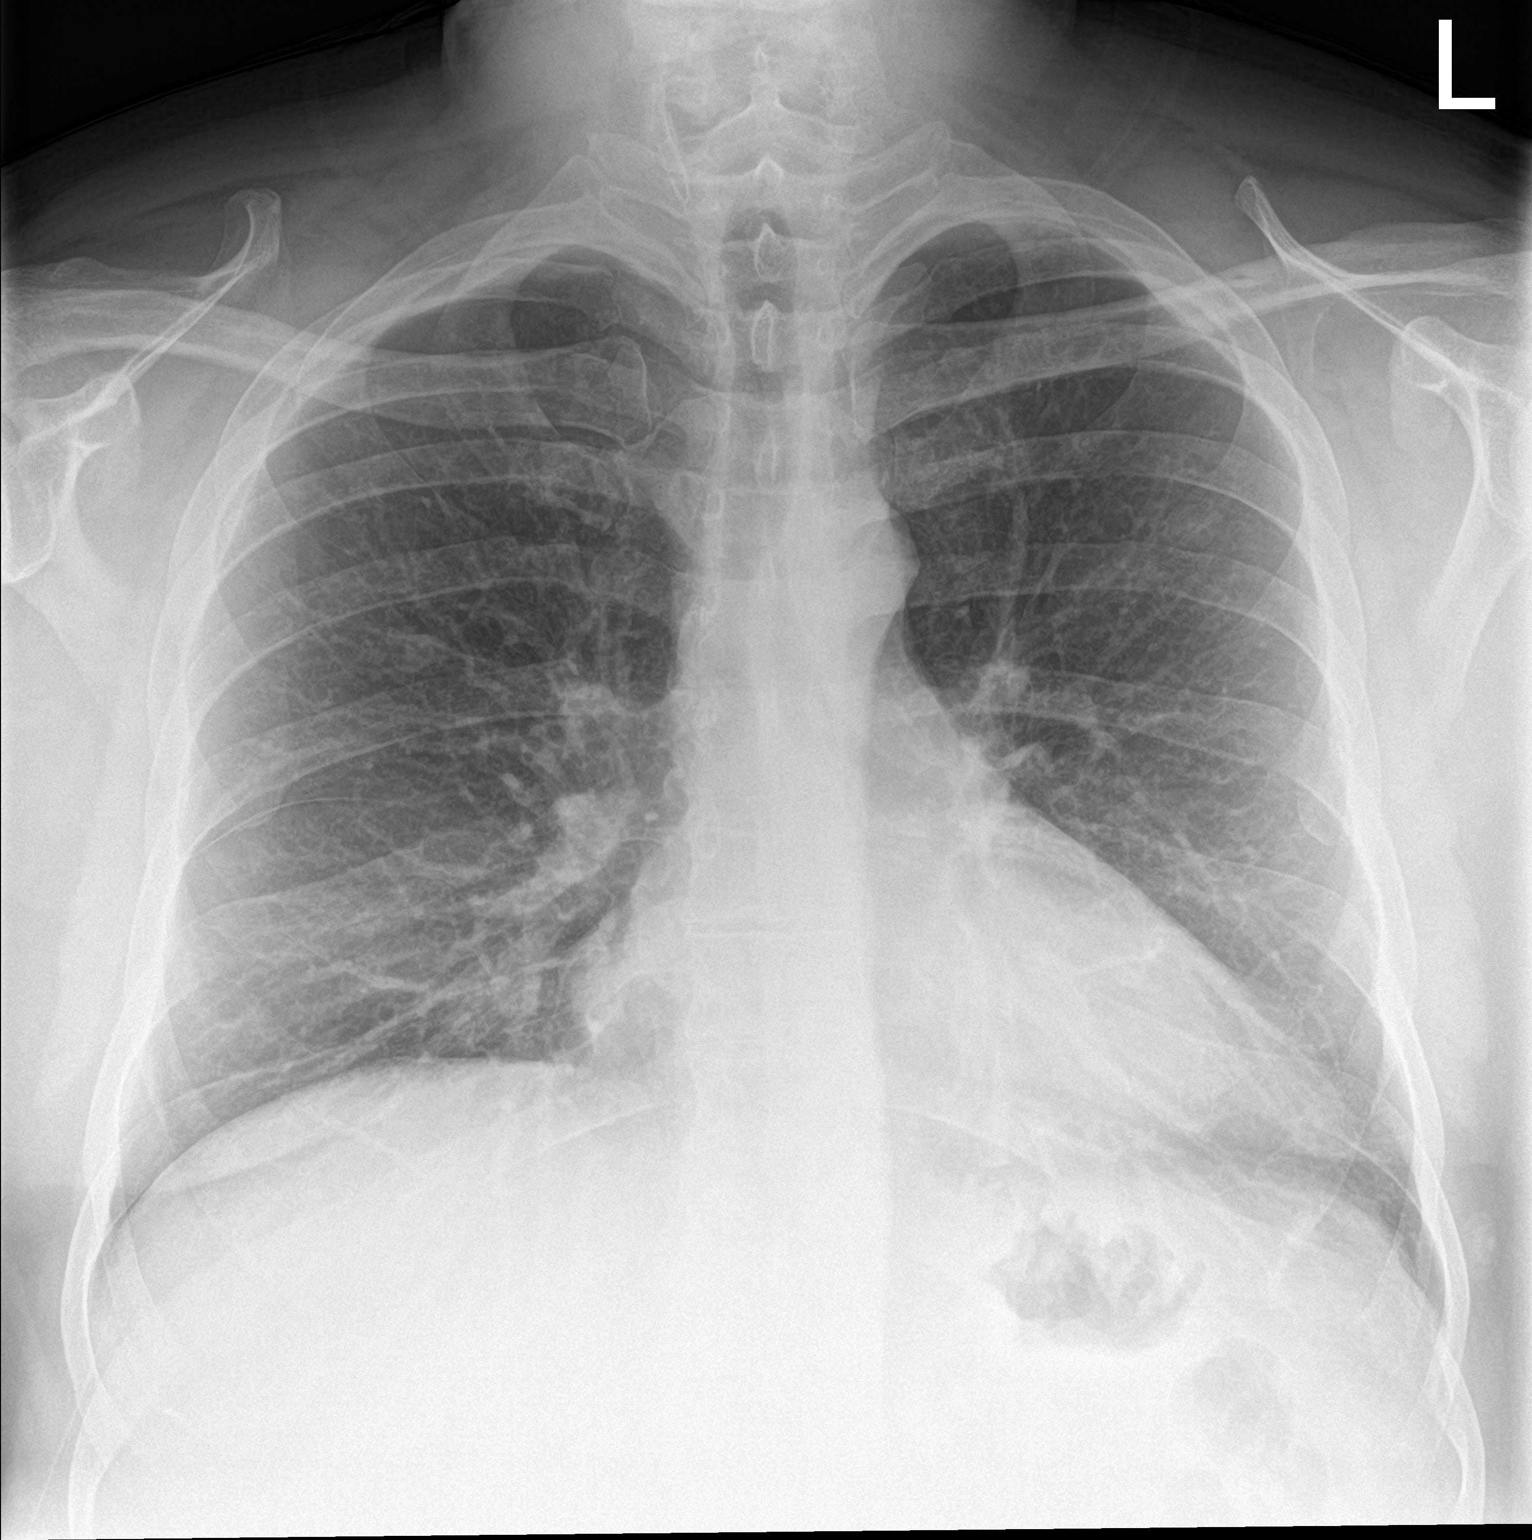

[chest lat]
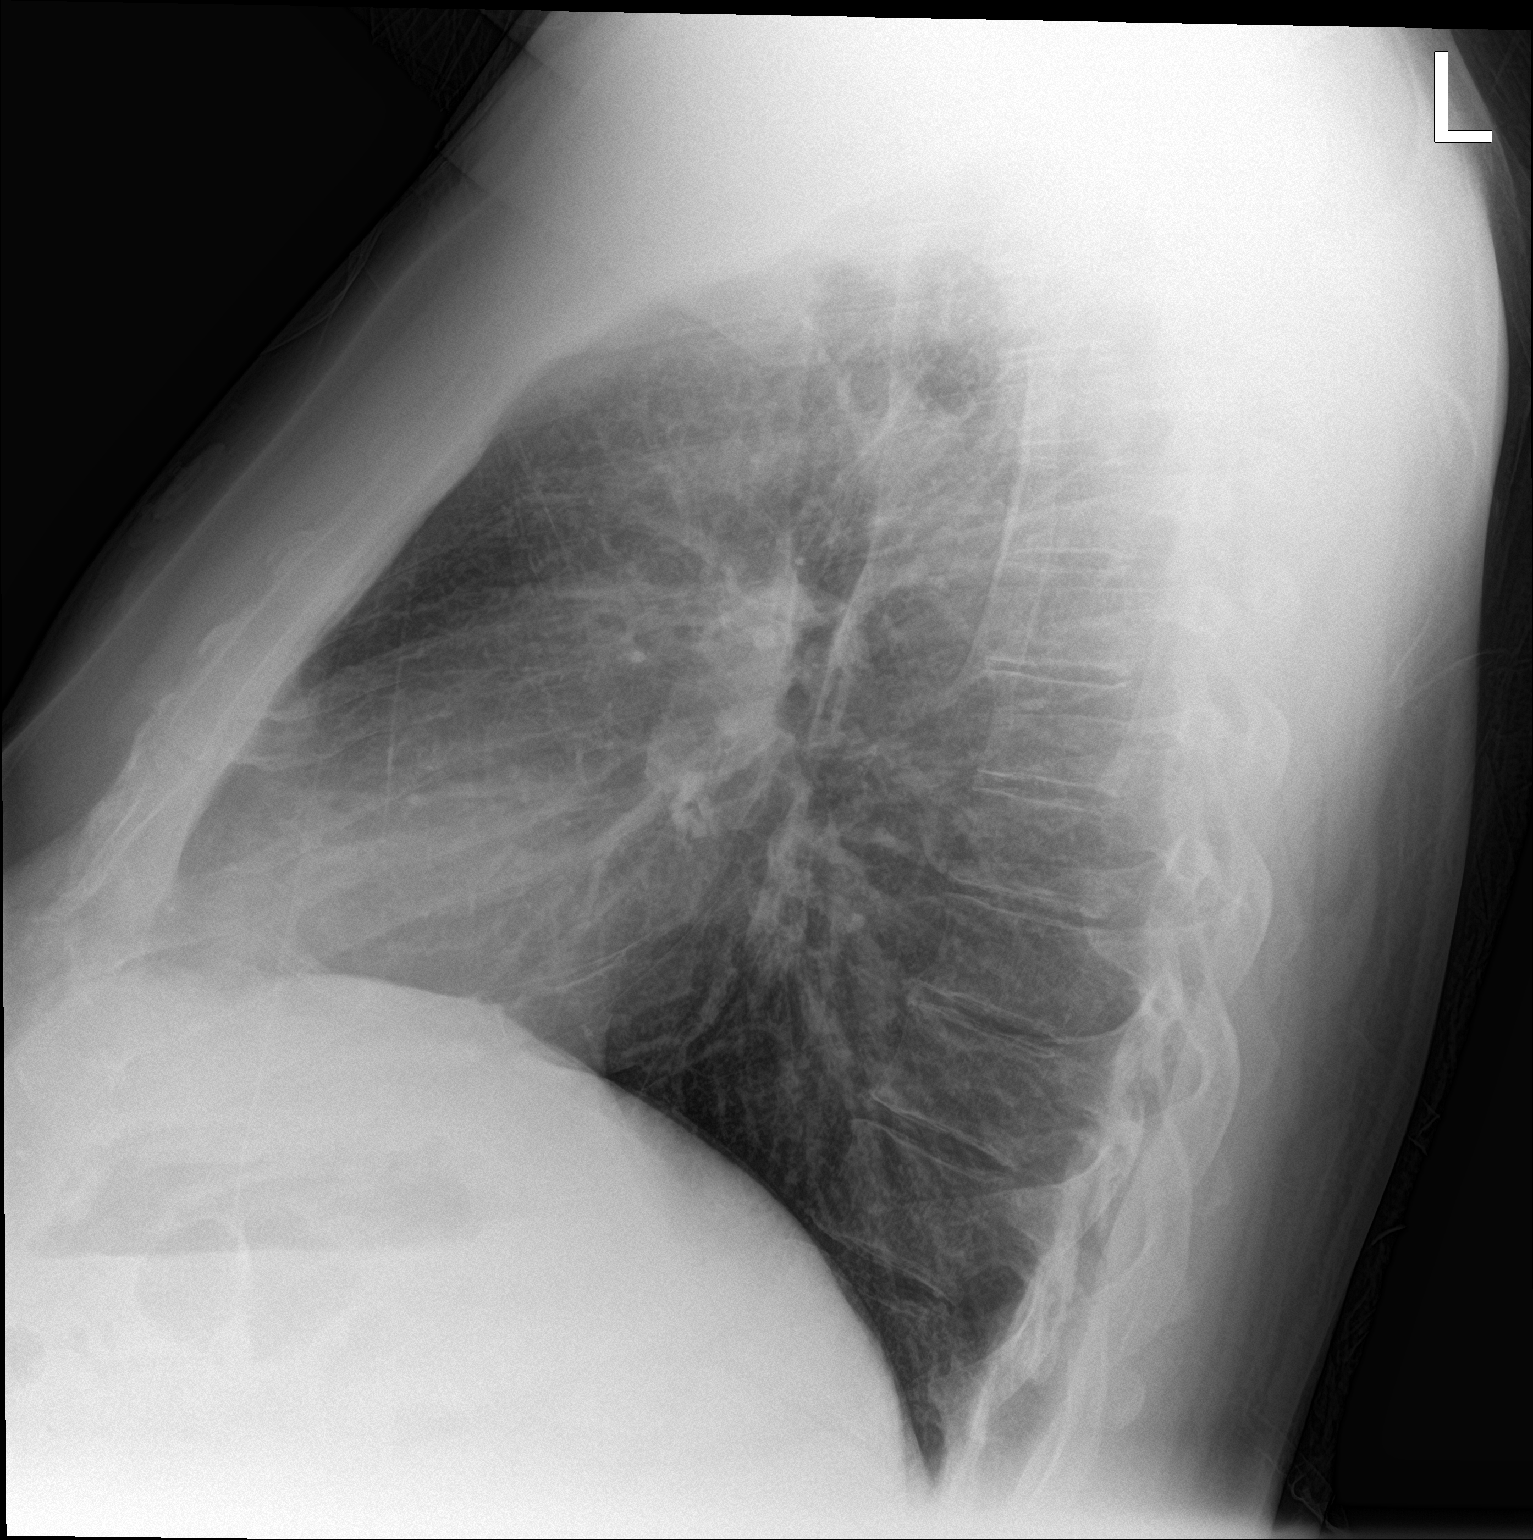

[2 of 2 positions shown; findings below may reference images not displayed]

FINDINGS: The lungs are clear without focal pneumonia, edema, pneumothorax or
pleural effusion. Cardiopericardial silhouette is at upper limits of
normal for size. The visualized bony structures of the thorax show
no acute abnormality.
IMPRESSION: No active cardiopulmonary disease.

## 2020-11-08 MED ORDER — CLONAZEPAM 0.5 MG PO TABS
1.0000 mg | ORAL_TABLET | Freq: Once | ORAL | Status: AC
  Administered 2020-11-08: 1 mg via ORAL
  Filled 2020-11-08: qty 2

## 2020-11-08 MED ORDER — NITROGLYCERIN 0.4 MG SL SUBL
0.4000 mg | SUBLINGUAL_TABLET | SUBLINGUAL | Status: DC | PRN
  Administered 2020-11-08 (×2): 0.4 mg via SUBLINGUAL
  Filled 2020-11-08: qty 1

## 2020-11-08 NOTE — ED Notes (Signed)
Patient had relief following 2 doses of Nitro- BP remains WDL. Patient reports slight headache.

## 2020-11-08 NOTE — ED Provider Notes (Signed)
I provided a substantive portion of the care of this patient.  I personally performed the entirety of the medical decision making for this encounter.      46 year old male presents with right-sided chest pain.  Pain has been constant for several hours.  Troponin here negative.  Given nitro and Klonopin and feels better.  Offered admission for further evaluation which she has deferred.  We will follow-up with his cardiologist   Lorre Nick, MD 11/08/20 [phone]

## 2020-11-08 NOTE — ED Triage Notes (Signed)
Pt reports constant right sided chest pain with radiation to R arm since this morning while walking. Has chronic shob d/t smoking, and sts this shob is not any worse than his usual. Denies n/v. Hx MI with stent placement in January.

## 2020-11-08 NOTE — ED Provider Notes (Signed)
Emergency Medicine Provider Triage Evaluation Note  Matthew [name] , a 45 y.o. male  was evaluated in triage.  Pt complains of who presents with concern for right-sided chest pain that started a few hours ago that radiates to his right arm.   Onset while he was walking.  He denies any increased shortness of breath though he states he has chronic shortness of breath due to smoking tobacco.  Denies any palpitations but does feel that his heart rate is elevated.  He states he is quite nervous.  Denies any nausea or vomiting.  He did have an MI in January of this year and has been on Brilinta and aspirin since that time.  Endorses compliance with these medications.  Review of Systems  Positive: Chest pain, exertional it radiates to the right arm Negative: Shortness of breath, palpitations, nausea, vomiting, diaphoresis  Physical Exam  BP 126/85 (BP Location: Right Arm)   Pulse (!) 115   Temp 98.1 F (36.7 C) (Oral)   Resp 18   SpO2 95%  Gen:   Awake, no distress   Resp:  Normal effort  MSK:   Moves extremities without difficulty  Other:  Tachycardic with regular rhythm, no M/R/G.  Lungs CTA B.  No lower extremity edema   Medical Decision Making  Medically screening exam initiated at 1:28 PM.  Appropriate orders placed.  Matthew [name] was informed that the remainder of the evaluation will be completed by another provider, this initial triage assessment does not replace that evaluation, and the importance of remaining in the ED until their evaluation is complete.  This chart was dictated using voice recognition software, Dragon. Despite the best efforts of this provider to proofread and correct errors, errors may still occur which can change documentation meaning.    Matthew [name] 11/08/20 1330    Matthew Hong, MD 11/09/20 1027

## 2020-11-08 NOTE — Discharge Instructions (Addendum)
Please call your heart doctor tomorrow morning to schedule close follow-up appointment regarding your recurrent chest pains Return to the emergency department if your chest pains persist, worsen, or not relieved with medications. It is important you continue taking your prescribed medications as directed.

## 2020-11-08 NOTE — ED Provider Notes (Signed)
[facility] EMERGENCY DEPARTMENT Provider Note   CSN: [id number] Arrival date & time: 11/08/20  1309     History Chief Complaint  Patient presents with   Chest Pain    Matthew [name] is a 45 y.o. male past medical history of CAD, insulin dependent type 2 diabetes, hypertension, hyperlipidemia, schizoaffective disorder, anxiety, NSTEMI in January of this year status post stents x2.  Patient is presenting today for recurrence of right-sided chest pain.  Pain started around 11 AM while walking.  It is described as dull, constant, radiating to the right arm.  Endorses associated diaphoresis and anxiousness.  He did not treat his symptoms with any medications.  He reports compliance with his home medications including his Brilinta.  He is however due for dose of his Klonopin which she requests due to his anxiety level. He also reports persisting episodes of lightheadedness over the last month with position change and after he has coughing fits.  The position changes for example when he gets out of his car and stands up he feels brief lightheadedness.  This resolves.  He is not having chest pains with the lightheadedness.  He also has his chronic episodes of shortness of breath that she attributes to his smoking history.  These issues have been addressed with cardiology and are actively being followed outpatient.  The history is provided by the patient.      Past Medical History:  Diagnosis Date   Anxiety    Celiac disease    Coronary artery disease    Dairy allergy    Depression    Diabetes mellitus without complication (HCC)    High cholesterol    Hypertension    Paranoia (HCC)    Pre-diabetes    Schizoaffective disorder (HCC)     Patient Active Problem List   Diagnosis Date Noted   Right-sided chest pain 09/11/2020   Coronary artery disease of bypass graft of native heart with stable angina pectoris (HCC)    Uncontrolled type 2 diabetes mellitus with  hyperglycemia (HCC)    Hyponatremia    Essential hypertension    History of hyperprolactinemia 08/16/2020   Mixed dyslipidemia 08/15/2020   Chest pain 06/06/2020   Type 2 diabetes mellitus with hyperglycemia (HCC) 06/06/2020   Hyperkalemia 06/06/2020   Thrombocytopenia (HCC) 06/06/2020   Obesity (BMI 30.0-34.9) 06/06/2020   Non-ST elevation (NSTEMI) myocardial infarction [facility])    Attention deficit hyperactivity disorder (ADHD), combined type, moderate 03/10/2018   Generalized anxiety disorder 03/10/2018   Schizoaffective disorder, bipolar type without good prognostic features (HCC) 02/09/2015   Morbid obesity (HCC) 01/18/2015   OSA (obstructive sleep apnea) 07/14/2014   Solitary pulmonary nodule 07/14/2014    Past Surgical History:  Procedure Laterality Date   APPENDECTOMY     BRAIN SURGERY     CARDIAC CATHETERIZATION     CORONARY STENT INTERVENTION N/A 06/06/2020   Procedure: CORONARY STENT INTERVENTION;  Surgeon: Swaziland, Peter M, MD;  Location: [facility] INVASIVE CV LAB;  Service: Cardiovascular;  Laterality: N/A;  prox LAD, distal RCA   INTRAVASCULAR ULTRASOUND/IVUS N/A 06/06/2020   Procedure: Intravascular Ultrasound/IVUS;  Surgeon: Swaziland, Peter M, MD;  Location: [facility] INVASIVE CV LAB;  Service: Cardiovascular;  Laterality: N/A;   LEFT HEART CATH AND CORONARY ANGIOGRAPHY N/A 06/06/2020   Procedure: LEFT HEART CATH AND CORONARY ANGIOGRAPHY;  Surgeon: Swazil[place], Peter M, MD;  Location: [facility] INVASIVE CV LAB;  Service: Cardiovascular;  Laterality: N/A;   NASAL SEPTUM SURGERY     WRIST FRACTURE  SURGERY     right wrist       Family History  Problem Relation Age of Onset   Asthma Mother    Hypertension Mother    Hypertension Father     Social History   Tobacco Use   Smoking status: Heavy Smoker    Packs/day: 1.00    Pack years: 0.00    Types: Cigarettes   Smokeless tobacco: Never   Tobacco comments:    patient given phone number to 1-800-quit now on 08/16/20  Vaping Use   Vaping  Use: Never used  Substance Use Topics   Alcohol use: No    Alcohol/week: 0.0 standard drinks    Comment: no (02/08/15)   Drug use: No    Home Medications Prior to Admission medications   Medication Sig Start Date End Date Taking? Authorizing Provider  aspirin EC 81 MG tablet Take 1 tablet (81 mg total) by mouth daily. Swallow whole. 06/07/20   Meriam SpraguePemberton, Heather E, MD  atorvastatin (LIPITOR) 40 MG tablet Take 40 mg by mouth daily.    [provider]  carvedilol (COREG) 25 MG tablet Take 1 tablet (25 mg total) by mouth 2 (two) times daily. 10/10/20   Meriam SpraguePemberton, Heather E, MD  clonazePAM (KLONOPIN) 1 MG tablet Take 1 tablet (1 mg total) by mouth 3 (three) times daily as needed for anxiety. 10/14/20   Cherie OuchHurst, Teresa T, PA-C  DULoxetine (CYMBALTA) 60 MG capsule Take 1 capsule (60 mg total) by mouth 2 (two) times daily. 09/08/20   Cherie OuchHurst, Teresa T, PA-C  empagliflozin (JARDIANCE) 25 MG TABS tablet Take 1 tablet (25 mg total) by mouth daily before breakfast. 10/10/20   Meriam SpraguePemberton, Heather E, MD  gabapentin (NEURONTIN) 800 MG tablet 1 po q am, 1/2 po at noon, 1 po at 4 pm, and 1 po at bedtime. Patient not taking: No sig reported 10/03/20   Melony OverlyHurst, Teresa T, PA-C  insulin degludec (TRESIBA FLEXTOUCH) 100 UNIT/ML FlexTouch Pen Inject 16 Units into the skin daily. 08/26/20   Shamleffer, Konrad DoloresIbtehal Jaralla, MD  Insulin Pen Needle 32G X 4 MM MISC 1 Device by Does not apply route daily. 08/15/20   Shamleffer, Konrad DoloresIbtehal Jaralla, MD  isosorbide mononitrate (IMDUR) 30 MG 24 hr tablet Take 0.5 tablets (15 mg total) by mouth daily. 08/10/20   Wynetta FinesMessick, Peter C, MD  lisinopril (ZESTRIL) 20 MG tablet Take 1 tablet (20 mg total) by mouth daily. 06/08/20   Dyann KiefLenze, Michele M, PA-C  lithium carbonate (LITHOBID) 300 MG CR tablet TAKE 2 TABLETS (600 MG TOTAL) BY MOUTH AT BEDTIME. 03/29/20   Chauncey MannJennings, Glenn E, MD  metFORMIN (GLUCOPHAGE) 1000 MG tablet Take 1 tablet (1,000 mg total) by mouth 2 (two) times daily with a meal. 08/15/20    Shamleffer, Konrad DoloresIbtehal Jaralla, MD  nitroGLYCERIN (NITROSTAT) 0.4 MG SL tablet Place 1 tablet (0.4 mg total) under the tongue every 5 (five) minutes as needed for chest pain. 08/10/20 08/10/21  Filbert SchilderMcDaniel, Jill D, NP  perphenazine (TRILAFON) 16 MG tablet TAKE 1 PILL TOTAL 16 MG EVERY MORNING AND TAKE 2 PILLS BY MOUTH TOTAL 32 MG EVERY BEDTIME Patient taking differently: Take 16-32 mg by mouth See admin instructions. Taking 1 tab (16 mg) in the AM and 2 tabs (32 mg) at bedtime 10/07/20   Melony OverlyHurst, Teresa T, PA-C  pregabalin (LYRICA) 100 MG capsule Take 100 mg by mouth 2 (two) times daily.    [provider]  spironolactone (ALDACTONE) 25 MG tablet Take 0.5 tablets (12.5 mg total) by  mouth daily. 07/20/20 07/15/21  Meriam Sprague, MD  ticagrelor (BRILINTA) 90 MG TABS tablet Take 1 tablet (90 mg total) by mouth 2 (two) times daily. 06/07/20   Meriam Sprague, MD  traZODone (DESYREL) 100 MG tablet TAKE 3 TABLETS BY MOUTH EVERY DAY AT BEDTIME AS NEEDED FOR SLEEP Patient taking differently: Take 300 mg by mouth at bedtime as needed for sleep. 09/08/20   Melony Overly T, PA-C    Allergies    Gluten meal  Review of Systems   Review of Systems  All other systems reviewed and are negative.  Physical Exam Updated Vital Signs BP 125/86   Pulse (!) 117   Temp 97.7 F (36.5 C) (Oral)   Resp 16   Ht 6\' 2"  (1.88 m)   Wt 124.7 kg   SpO2 94%   BMI 35.31 kg/m   Physical Exam Vitals and nursing note reviewed.  Constitutional:      Appearance: He is well-developed.  HENT:     Head: Normocephalic and atraumatic.  Eyes:     Conjunctiva/sclera: Conjunctivae normal.  Cardiovascular:     Rate and Rhythm: Regular rhythm. Tachycardia present.     Pulses: Normal pulses.  Pulmonary:     Effort: Pulmonary effort is normal. No respiratory distress.     Breath sounds: Normal breath sounds.  Abdominal:     Palpations: Abdomen is soft.  Musculoskeletal:     Right lower leg: No edema.     Left lower  leg: No edema.  Skin:    General: Skin is warm.  Neurological:     Mental Status: He is alert.  Psychiatric:        Mood and Affect: Mood is anxious.        Behavior: Behavior normal.    ED Results / Procedures / Treatments   Labs (all labs ordered are listed, but only abnormal results are displayed) Labs Reviewed  BASIC METABOLIC PANEL - Abnormal; Notable for the following components:      Result Value   Sodium 132 (*)    CO2 21 (*)    Glucose, Bld 151 (*)    All other components within normal limits  CBC - Abnormal; Notable for the following components:   WBC 15.7 (*)    All other components within normal limits  CBG MONITORING, ED - Abnormal; Notable for the following components:   Glucose-Capillary 169 (*)    All other components within normal limits  PROTIME-INR  TROPONIN I (HIGH SENSITIVITY)  TROPONIN I (HIGH SENSITIVITY)    EKG None 08-Nov-2020 13:15:24 Vent. rate 115 BPM PR interval 188 ms QRS duration 108 ms QT/QTcB 308/426 ms P-R-T axes 49 162 31 Sinus tachycardia Low voltage QRS Right bundle branch block Inferior infarct , age undetermined Possible Anterolateral infarct , age undetermined Abnormal ECG No significant change since last tracing Confirmed by 10-Nov-2020 (Lorre Nick) on 11/08/2020 5:25:50 PM  Radiology DG Chest 2 View  Result Date: 11/08/2020 CLINICAL DATA:  Chest pain EXAM: CHEST - 2 VIEW COMPARISON:  10/13/2020 FINDINGS: The lungs are clear without focal pneumonia, edema, pneumothorax or pleural effusion. Cardiopericardial silhouette is at upper limits of normal for size. The visualized bony structures of the thorax show no acute abnormality. IMPRESSION: No active cardiopulmonary disease. Electronically Signed   By: 10/15/2020 M.D.   On: 11/08/2020 14:11    Procedures Procedures   Medications Ordered in ED Medications  nitroGLYCERIN (NITROSTAT) SL tablet 0.4 mg (0.4 mg Sublingual Given 11/08/20 1629)  clonazePAM (KLONOPIN) tablet 1 mg  (1 mg Oral Given 11/08/20 1546)    ED Course  I have reviewed the triage vital signs and the nursing notes.  Pertinent labs & imaging results that were available during my care of the patient were reviewed by me and considered in my medical decision making (see chart for details).     MDM Rules/Calculators/A&P                          Patient with history of NSTEMI in January of this year that is post successful PCI with stent placements x2, presenting for evaluation of right-sided chest pain.  He has been seen multiple times for chest pain.  Pain today began while he was walking, is right-sided radiating down the right arm, described as a dull pain which was constant over multiple hours.  He also endorses associated anxiety.  He is slightly tachycardic here and anxious.  Treated with his home dose of clonazepam and nitroglycerin and has complete resolution of his symptoms.  His troponins are flat and normal.  Blood work is without acute significant change from baseline.  Chest x-ray is negative.  EKG is unchanged from previous and without signs of new ischemia.   Had shared decision making with patient regarding disposition.  He would very much like to go home as he is completely chest pain-free.  Patient was discussed with and evaluated by attending physician Dr. Freida Busman, who is in agreement.  Patient is discharged with instructions to follow closely with cardiology, and to return if symptoms recur or worsen in any way.  He is discharged in no distress  Discussed results, findings, treatment and follow up. Patient advised of return precautions. Patient verbalized understanding and agreed with plan.  Final Clinical Impression(s) / ED Diagnoses Final diagnoses:  Right-sided chest pain    Rx / DC Orders ED Discharge Orders     None        Kaslyn Richburg, [place] N, PA-C 11/08/20 1807    Lorre Nick, MD 11/10/20 2314

## 2020-11-08 NOTE — ED Notes (Signed)
Pt transported to XRAY °

## 2020-11-09 ENCOUNTER — Encounter (HOSPITAL_COMMUNITY): Payer: Self-pay

## 2020-11-09 ENCOUNTER — Emergency Department (HOSPITAL_COMMUNITY)
Admission: EM | Admit: 2020-11-09 | Discharge: 2020-11-09 | Discharge status: Against Medical Advice | Payer: Medicaid Other | Source: Home / Self Care

## 2020-11-09 ENCOUNTER — Emergency Department (HOSPITAL_COMMUNITY)
Admission: EM | Admit: 2020-11-09 | Discharge: 2020-11-09 | Disposition: A | Discharge status: Home / Self Care | Payer: Medicaid Other | Attending: Emergency Medicine | Admitting: Emergency Medicine

## 2020-11-09 ENCOUNTER — Emergency Department (HOSPITAL_COMMUNITY): Payer: Medicaid Other

## 2020-11-09 ENCOUNTER — Other Ambulatory Visit: Payer: Self-pay

## 2020-11-09 DIAGNOSIS — R61 Generalized hyperhidrosis: Secondary | ICD-10-CM | POA: Insufficient documentation

## 2020-11-09 DIAGNOSIS — Z7982 Long term (current) use of aspirin: Secondary | ICD-10-CM | POA: Diagnosis not present

## 2020-11-09 DIAGNOSIS — Z7984 Long term (current) use of oral hypoglycemic drugs: Secondary | ICD-10-CM | POA: Insufficient documentation

## 2020-11-09 DIAGNOSIS — R079 Chest pain, unspecified: Secondary | ICD-10-CM | POA: Insufficient documentation

## 2020-11-09 DIAGNOSIS — I25118 Atherosclerotic heart disease of native coronary artery with other forms of angina pectoris: Secondary | ICD-10-CM | POA: Insufficient documentation

## 2020-11-09 DIAGNOSIS — Z951 Presence of aortocoronary bypass graft: Secondary | ICD-10-CM | POA: Insufficient documentation

## 2020-11-09 DIAGNOSIS — I10 Essential (primary) hypertension: Secondary | ICD-10-CM | POA: Diagnosis not present

## 2020-11-09 DIAGNOSIS — Z5321 Procedure and treatment not carried out due to patient leaving prior to being seen by health care provider: Secondary | ICD-10-CM | POA: Insufficient documentation

## 2020-11-09 DIAGNOSIS — Z79899 Other long term (current) drug therapy: Secondary | ICD-10-CM | POA: Insufficient documentation

## 2020-11-09 DIAGNOSIS — F17210 Nicotine dependence, cigarettes, uncomplicated: Secondary | ICD-10-CM | POA: Diagnosis not present

## 2020-11-09 DIAGNOSIS — Z794 Long term (current) use of insulin: Secondary | ICD-10-CM | POA: Diagnosis not present

## 2020-11-09 DIAGNOSIS — F419 Anxiety disorder, unspecified: Secondary | ICD-10-CM | POA: Diagnosis not present

## 2020-11-09 DIAGNOSIS — E119 Type 2 diabetes mellitus without complications: Secondary | ICD-10-CM | POA: Diagnosis not present

## 2020-11-09 LAB — COMPREHENSIVE METABOLIC PANEL
ALT: 40 U/L (ref 0–44)
AST: 26 U/L (ref 15–41)
Albumin: 4.4 g/dL (ref 3.5–5.0)
Alkaline Phosphatase: 81 U/L (ref 38–126)
Anion gap: 10 (ref 5–15)
BUN: 14 mg/dL (ref 6–20)
CO2: 22 mmol/L (ref 22–32)
Calcium: 9.2 mg/dL (ref 8.9–10.3)
Chloride: 100 mmol/L (ref 98–111)
Creatinine, Ser: 1.14 mg/dL (ref 0.61–1.24)
GFR, Estimated: >60 mL/min (ref >60)
Glucose, Bld: 326 mg/dL — ABNORMAL HIGH (ref 70–99)
Potassium: 4.1 mmol/L (ref 3.5–5.1)
Sodium: 132 mmol/L — ABNORMAL LOW (ref 135–145)
Total Bilirubin: 0.5 mg/dL (ref 0.3–1.2)
Total Protein: 7.1 g/dL (ref 6.5–8.1)

## 2020-11-09 LAB — BASIC METABOLIC PANEL
Anion gap: 12 (ref 5–15)
BUN: 15 mg/dL (ref 6–20)
CO2: 22 mmol/L (ref 22–32)
Calcium: 9.4 mg/dL (ref 8.9–10.3)
Chloride: 100 mmol/L (ref 98–111)
Creatinine, Ser: 1.02 mg/dL (ref 0.61–1.24)
GFR, Estimated: >60 mL/min (ref >60)
Glucose, Bld: 218 mg/dL — ABNORMAL HIGH (ref 70–99)
Potassium: 4 mmol/L (ref 3.5–5.1)
Sodium: 134 mmol/L — ABNORMAL LOW (ref 135–145)

## 2020-11-09 LAB — CBC WITH DIFFERENTIAL/PLATELET
Abs Immature Granulocytes: 0.07 10*3/uL (ref 0.00–0.07)
Basophils Absolute: 0.1 10*3/uL (ref 0.0–0.1)
Basophils Relative: 1 %
Eosinophils Absolute: 0.6 10*3/uL — ABNORMAL HIGH (ref 0.0–0.5)
Eosinophils Relative: 4 %
HCT: 48.7 % (ref 39.0–52.0)
Hemoglobin: 15.9 g/dL (ref 13.0–17.0)
Immature Granulocytes: 1 %
Lymphocytes Relative: 17 %
Lymphs Abs: 2.3 10*3/uL (ref 0.7–4.0)
MCH: 28.5 pg (ref 26.0–34.0)
MCHC: 32.6 g/dL (ref 30.0–36.0)
MCV: 87.3 fL (ref 80.0–100.0)
Monocytes Absolute: 0.9 10*3/uL (ref 0.1–1.0)
Monocytes Relative: 7 %
Neutro Abs: 9.9 10*3/uL — ABNORMAL HIGH (ref 1.7–7.7)
Neutrophils Relative %: 70 %
Platelets: 235 10*3/uL (ref 150–400)
RBC: 5.58 MIL/uL (ref 4.22–5.81)
RDW: 14.8 % (ref 11.5–15.5)
WBC: 13.9 10*3/uL — ABNORMAL HIGH (ref 4.0–10.5)
nRBC: 0 % (ref 0.0–0.2)

## 2020-11-09 LAB — CBC
HCT: 47.5 % (ref 39.0–52.0)
Hemoglobin: 16 g/dL (ref 13.0–17.0)
MCH: 28.8 pg (ref 26.0–34.0)
MCHC: 33.7 g/dL (ref 30.0–36.0)
MCV: 85.4 fL (ref 80.0–100.0)
Platelets: 244 10*3/uL (ref 150–400)
RBC: 5.56 MIL/uL (ref 4.22–5.81)
RDW: 14.7 % (ref 11.5–15.5)
WBC: 15.5 10*3/uL — ABNORMAL HIGH (ref 4.0–10.5)
nRBC: 0 % (ref 0.0–0.2)

## 2020-11-09 LAB — TROPONIN I (HIGH SENSITIVITY)
Troponin I (High Sensitivity): 5 ng/L (ref <18)
Troponin I (High Sensitivity): 4 ng/L (ref <18)
Troponin I (High Sensitivity): 4 ng/L (ref <18)

## 2020-11-09 IMAGING — DX DG CHEST 2V
2 series · 2 of 2 positions shown · non-contrast
Comparison: [DATE].

CLINICAL DATA: Chest pain.

EXAM:
CHEST - 2 VIEW

[w chest pa]
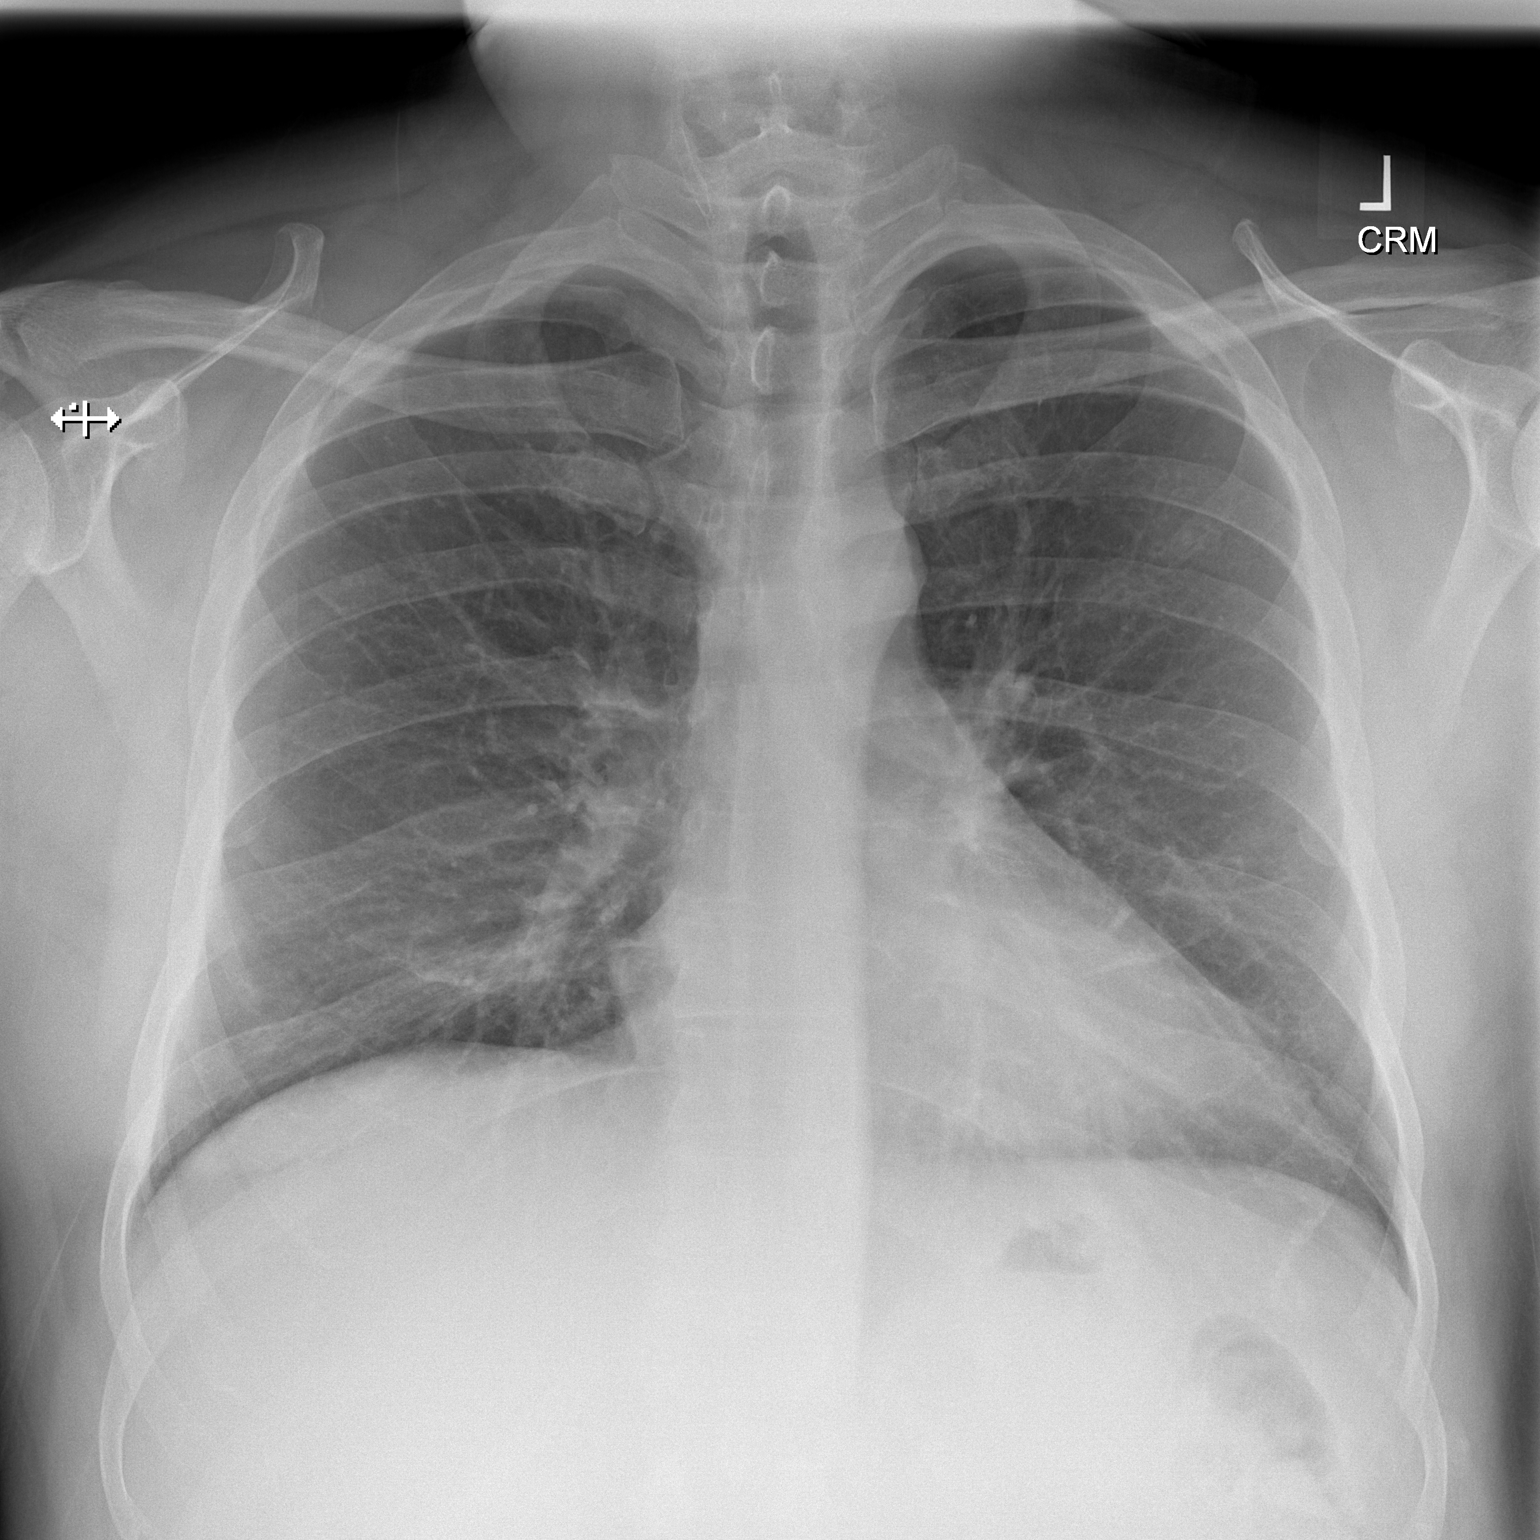

[w chest lat]
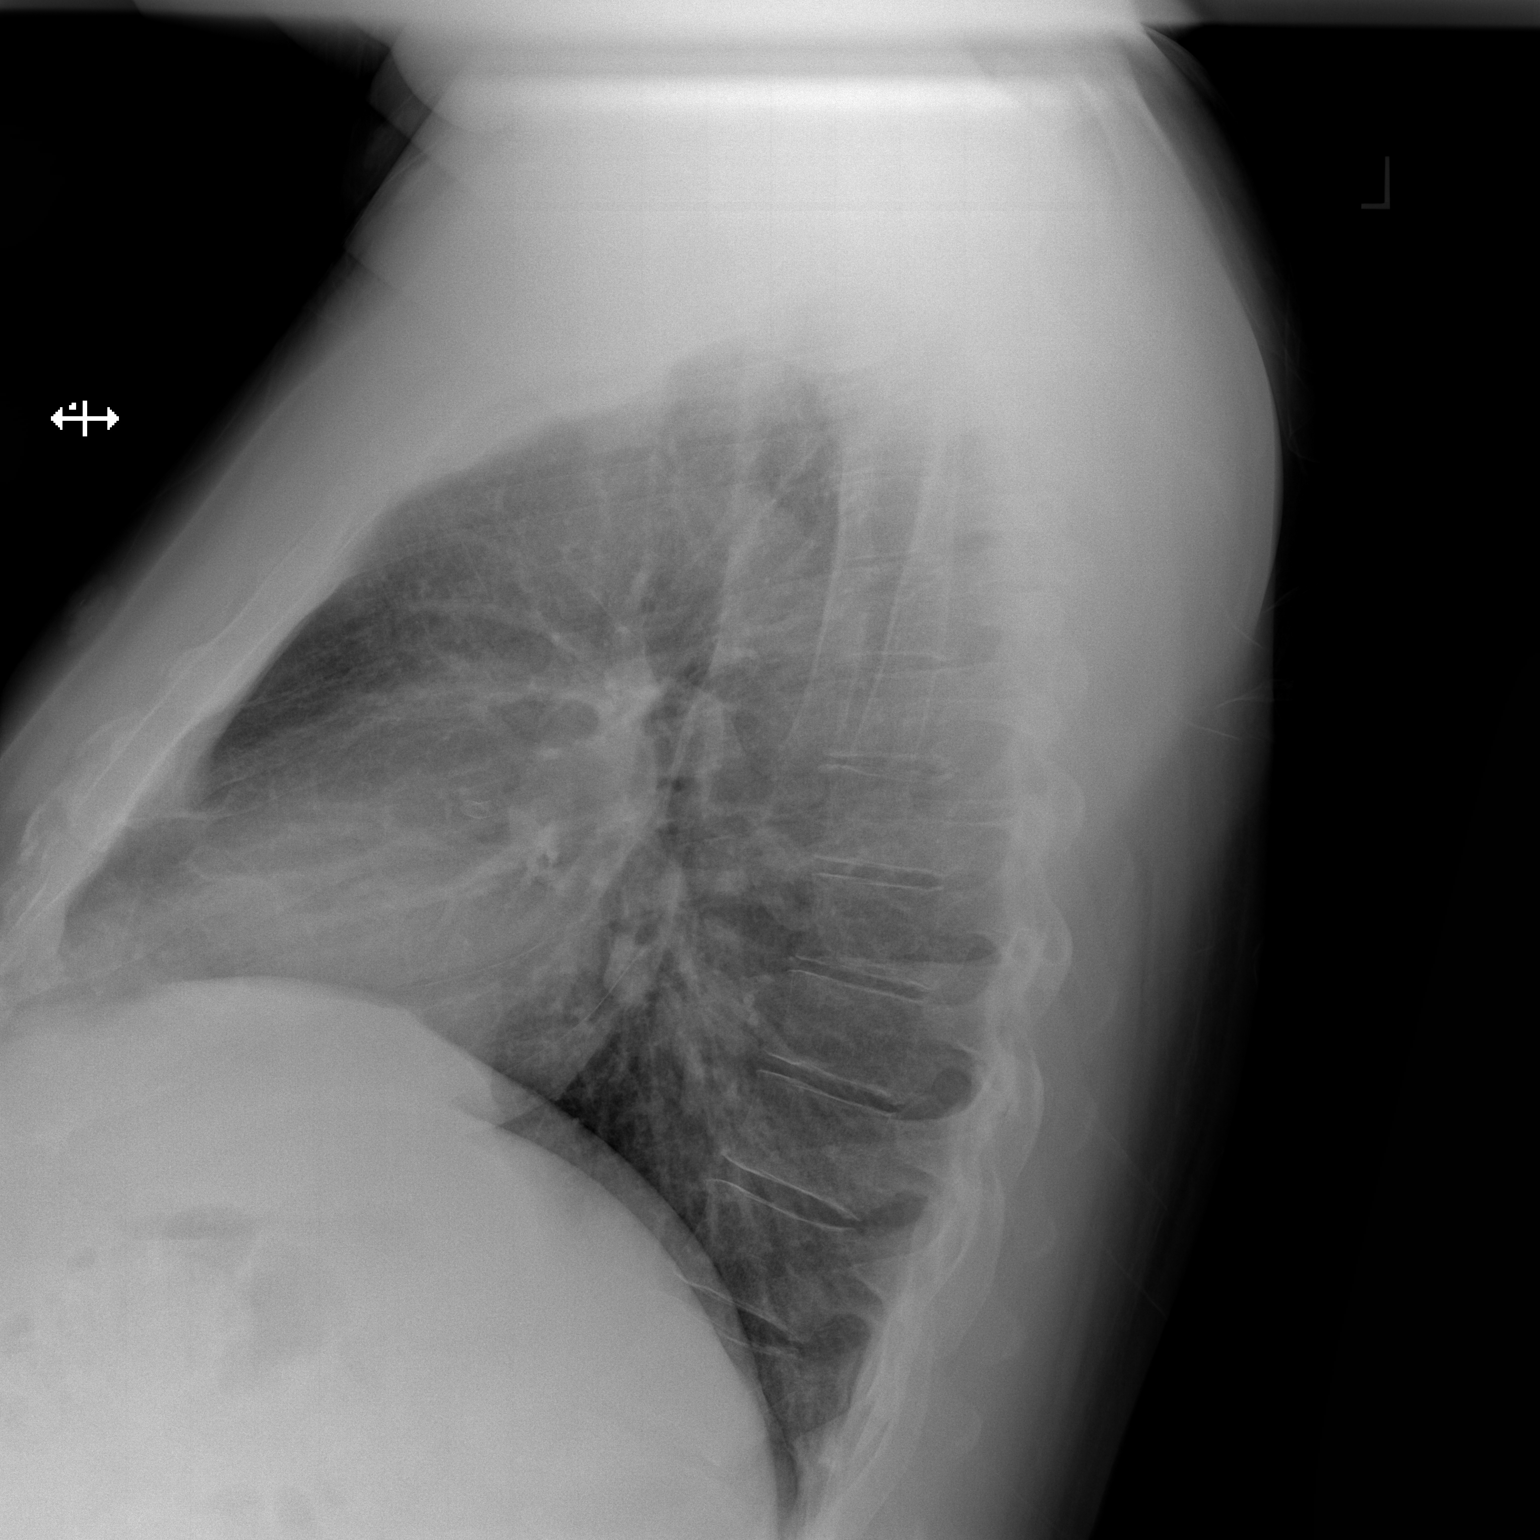

[2 of 2 positions shown; findings below may reference images not displayed]

FINDINGS: Mediastinum hilar structures normal. Cardiomegaly. No pulmonary
venous congestion. Mild right base subsegmental atelectasis. No
pleural effusion or pneumothorax. No acute bony abnormality.
IMPRESSION: Mild right base subsegmental atelectasis.

## 2020-11-09 MED ORDER — ASPIRIN 325 MG PO TABS
325.0000 mg | ORAL_TABLET | Freq: Every day | ORAL | Status: DC
  Administered 2020-11-09: 325 mg via ORAL
  Filled 2020-11-09: qty 1

## 2020-11-09 MED ORDER — NITROGLYCERIN 0.4 MG SL SUBL
0.4000 mg | SUBLINGUAL_TABLET | SUBLINGUAL | Status: AC
  Administered 2020-11-09: 0.4 mg via SUBLINGUAL
  Filled 2020-11-09: qty 1

## 2020-11-09 MED ORDER — CLONAZEPAM 0.5 MG PO TABS
1.0000 mg | ORAL_TABLET | ORAL | Status: AC
  Administered 2020-11-09: 1 mg via ORAL
  Filled 2020-11-09: qty 2

## 2020-11-09 MED ORDER — FENTANYL CITRATE (PF) 100 MCG/2ML IJ SOLN
50.0000 ug | INTRAMUSCULAR | Status: DC | PRN
  Administered 2020-11-09: 50 ug via INTRAVENOUS
  Filled 2020-11-09: qty 2

## 2020-11-09 NOTE — ED Provider Notes (Signed)
Emergency Medicine Provider Triage Evaluation Note  Matthew [name] , a 45 y.o. male  was evaluated in triage.  Pt complains of patient is 45 year old male with past medical history significant for NSTEMI s/p 2 stents in January, daily Klonopin use, anxiety, bipolar.  Patient was seen by this provider earlier today offered admission for his chest pain and ultimately declined it was discharged home.  He had 2 negative troponins.  He states that after he was discharged home his symptoms returned.  He is having some sweating and diaphoresis.   He feels anxious and somewhat tremulous.  He has not taken his 3rd Klonopin dose today  Review of Systems  Positive: Anxious, chest pain, sweating Negative: Vomiting  Physical Exam  BP 129/86 (BP Location: Right Arm)   Pulse (!) 119   Temp 98.1 F (36.7 C) (Oral)   Resp 18   SpO2 92%  Gen:   Awake, no distress  --does appear somewhat anxious however Resp:  Normal effort  MSK:   Moves extremities without difficulty  Other:    Medical Decision Making  Medically screening exam initiated at 6:48 PM.  Appropriate orders placed.  Matthew [name] was informed that the remainder of the evaluation will be completed by another provider, this initial triage assessment does not replace that evaluation, and the importance of remaining in the ED until their evaluation is complete.  I have very high suspicion for benzodiazepine withdrawal.  However with this patient he also has history of cardiac disease with coronary artery disease and stents.  We will repeat troponins.  Patient is already had chest x-ray earlier today and doubt pneumothorax or other indication to repeat chest x-ray.   Solon Augusta [place], [place] 11/09/20 1850    Mancel Bale, MD 11/09/20 2106

## 2020-11-09 NOTE — ED Provider Notes (Addendum)
MSE was initiated and I personally evaluated the patient and placed orders (if any) at  6:06 AM on November 09, 2020.  History of HTN, HLD, pre-DM, celiac, schizoaffective, anxiety, ADHD, OSA not on CPAP, recent NSTEMI s/p DES to pLAD and DES to dRCA (January 2022) presents with chest pain that started yesterday. Seen in ED, was offered admission but chose to leave and go home. Reports recurrent right sided aching chest pain woke him from sleep at around 3:00 am. He took one NTG without change. Pain 8/10 now. Mild SOB, vomiting.   Today's Vitals   11/09/20 0602 11/09/20 0606  BP: 126/76   Pulse: (!) 121   Resp: 20   Temp: 97.9 F (36.6 C)   TempSrc: Oral   SpO2: 96%   Weight:  122.5 kg  Height:  6\' 2"  (1.88 m)  PainSc:  7    Body mass index is 34.67 kg/m.  Obese, in NAD RRR Lungs clear No peripheral edema  The patient appears stable so that the remainder of the MSE may be completed by another provider.  LABS REPEATED -  CXR not ORDERED AS IT WAS DONE LESS THAN 24 HOURS AGO   , PA-C 11/09/20 0610    11/11/20, PA-C 11/09/20 11/11/20    1540, MD 11/09/20 [id number]

## 2020-11-09 NOTE — Discharge Instructions (Addendum)
Please follow-up with your cardiologist.  Medications.  If you have more episodes of chest pain you may always return to the ER however I do recommend taking 1-2 doses of nitroglycerin prior to arriving at the ER.  Please call your cardiologist today to see if you can follow-up Friday or early next week.

## 2020-11-09 NOTE — ED Notes (Signed)
Pt left AMA °

## 2020-11-09 NOTE — ED Provider Notes (Signed)
[facility]   CSN: [id number] Arrival date & time: 11/09/20  0551     History Chief Complaint  Patient presents with   Chest Pain    Matthew [name] is a 45 y.o. male.  HPI Patient is a 45 year old male with past medical history significant for CAD status post x2 stents in January, DM 2, HTN, HLD, schizoaffective disorder anxiety  Patient is presented to the emergency department today for 7/10 right-sided chest pain that describes as an achy pressure.  He states it started at 3 AM this morning he was asleep in bed when he woke up with this sensation.  He states that it is constant seems to be radiating to the center of his chest on the right side.  Denies any radiation to his back or neck.  He states that he also felt somewhat more short of breath when he walked to the parking lot but states his pain did not get necessarily worse.  He endorses some associated diaphoresis and anxiousness denies any lightheadedness or dizziness.  No cough fevers chills congestion.  He came straight to the ER.  He did not use any nitroglycerin or any of his prescribed Klonopin.  He states that he takes his home medications which includes atorvastatin, Klonopin, Cymbalta, Jardiance, insulin, Brilinta and trazodone for sleep  Patient states that he has not been sleeping well in general feels that he is quite anxious.  He states that for the past 5 months since his NSTEMI in January he has been having intermittent episodes of chest pain.  He is seen his cardiologist but did not bring this up to her. He states that you prefer not to come to the hospital because he feels it makes him more anxious.  He states that his chest pain has been persistent since 3 AM.  No recent surgeries, hospitalization, long travel, hemoptysis, estrogen containing OCP, cancer history.  No unilateral leg swelling.  No history of PE or VTE.     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 with stable angina pectoris (HCC)    Uncontrolled type 2 diabetes mellitus with hyperglycemia (HCC)    Hyponatremia    Essential hypertension    History of hyperprolactinemia 08/16/2020   Mixed dyslipidemia 08/15/2020   Chest pain 06/06/2020   Type 2 diabetes mellitus with hyperglycemia (HCC) 06/06/2020   Hyperkalemia 06/06/2020   Thrombocytopenia (HCC) 06/06/2020   Obesity (BMI 30.0-34.9) 06/06/2020   Non-ST elevation (NSTEMI) myocardial infarction [facility](HCC)    Attention deficit hyperactivity disorder (ADHD), combined type, moderate 03/10/2018   Generalized anxiety disorder 03/10/2018   Schizoaffective disorder, bipolar type without good prognostic features (HCC) 02/09/2015   Morbid obesity (HCC) 01/18/2015   OSA (obstructive sleep apnea) 07/14/2014   Solitary pulmonary nodule 07/14/2014    Past Surgical History:  Procedure Laterality Date   APPENDECTOMY     BRAIN SURGERY     CARDIAC CATHETERIZATION     CORONARY STENT INTERVENTION N/A 06/06/2020  Procedure: CORONARY STENT INTERVENTION;  Surgeon: Swaziland, Peter M, MD;  Location: [facility] INVASIVE CV LAB;  Service: Cardiovascular;  Laterality: N/A;  prox LAD, distal RCA   INTRAVASCULAR ULTRASOUND/IVUS N/A 06/06/2020   Procedure: Intravascular Ultrasound/IVUS;  Surgeon: Swaziland, Peter M, MD;  Location: [facility] INVASIVE CV LAB;  Service: Cardiovascular;  Laterality: N/A;   LEFT HEART CATH AND CORONARY ANGIOGRAPHY N/A 06/06/2020   Procedure: LEFT HEART CATH AND CORONARY ANGIOGRAPHY;  Surgeon: Swaziland, Peter M, MD;  Location: [facility] INVASIVE CV LAB;  Service:  Cardiovascular;  Laterality: N/A;   NASAL SEPTUM SURGERY     WRIST FRACTURE SURGERY     right wrist       Family History  Problem Relation Age of Onset   Asthma Mother    Hypertension Mother    Hypertension Father     Social History   Tobacco Use   Smoking status: Heavy Smoker    Packs/day: 1.00    Pack years: 0.00    Types: Cigarettes   Smokeless tobacco: Never   Tobacco comments:    patient given phone number to 1-800-quit now on 08/16/20  Vaping Use   Vaping Use: Never used  Substance Use Topics   Alcohol use: No    Alcohol/week: 0.0 standard drinks    Comment: no (02/08/15)   Drug use: No    Home Medications Prior to Admission medications   Medication Sig Start Date End Date Taking? Authorizing Provider  aspirin EC 81 MG tablet Take 1 tablet (81 mg total) by mouth daily. Swallow whole. 06/07/20   Meriam Sprague, MD  atorvastatin (LIPITOR) 40 MG tablet Take 40 mg by mouth daily.    [provider]  carvedilol (COREG) 25 MG tablet Take 1 tablet (25 mg total) by mouth 2 (two) times daily. 10/10/20   Meriam Sprague, MD  clonazePAM (KLONOPIN) 1 MG tablet Take 1 tablet (1 mg total) by mouth 3 (three) times daily as needed for anxiety. 10/14/20   Cherie Ouch, PA-C  DULoxetine (CYMBALTA) 60 MG capsule Take 1 capsule (60 mg total) by mouth 2 (two) times daily. 09/08/20   Cherie Ouch, PA-C  empagliflozin (JARDIANCE) 25 MG TABS tablet Take 1 tablet (25 mg total) by mouth daily before breakfast. 10/10/20   Meriam Sprague, MD  gabapentin (NEURONTIN) 800 MG tablet 1 po q am, 1/2 po at noon, 1 po at 4 pm, and 1 po at bedtime. Patient not taking: No sig reported 10/03/20   Melony Overly T, PA-C  insulin degludec (TRESIBA FLEXTOUCH) 100 UNIT/ML FlexTouch Pen Inject 16 Units into the skin daily. 08/26/20   Shamleffer, Konrad Dolores, MD  Insulin Pen Needle 32G X 4 MM MISC 1 Device by Does not apply route daily. 08/15/20   Shamleffer, Konrad Dolores, MD   isosorbide mononitrate (IMDUR) 30 MG 24 hr tablet Take 0.5 tablets (15 mg total) by mouth daily. 08/10/20   Wynetta Fines, MD  lisinopril (ZESTRIL) 20 MG tablet Take 1 tablet (20 mg total) by mouth daily. 06/08/20   Dyann Kief, PA-C  lithium carbonate (LITHOBID) 300 MG CR tablet TAKE 2 TABLETS (600 MG TOTAL) BY MOUTH AT BEDTIME. 03/29/20   Chauncey Mann, MD  metFORMIN (GLUCOPHAGE) 1000 MG tablet Take 1 tablet (1,000 mg total) by mouth 2 (two) times daily with a meal. 08/15/20   Shamleffer, Konrad Dolores, MD  nitroGLYCERIN (NITROSTAT) 0.4 MG SL tablet Place 1 tablet (0.4 mg total) under the tongue every 5 (five) minutes  as needed for chest pain. 08/10/20 08/10/21  Filbert Schilder, NP  perphenazine (TRILAFON) 16 MG tablet TAKE 1 PILL TOTAL 16 MG EVERY MORNING AND TAKE 2 PILLS BY MOUTH TOTAL 32 MG EVERY BEDTIME Patient taking differently: Take 16-32 mg by mouth See admin instructions. Taking 1 tab (16 mg) in the AM and 2 tabs (32 mg) at bedtime 10/07/20   Melony Overly T, PA-C  pregabalin (LYRICA) 100 MG capsule Take 100 mg by mouth 2 (two) times daily.    [provider]  spironolactone (ALDACTONE) 25 MG tablet Take 0.5 tablets (12.5 mg total) by mouth daily. 07/20/20 07/15/21  Meriam Sprague, MD  ticagrelor (BRILINTA) 90 MG TABS tablet Take 1 tablet (90 mg total) by mouth 2 (two) times daily. 06/07/20   Meriam Sprague, MD  traZODone (DESYREL) 100 MG tablet TAKE 3 TABLETS BY MOUTH EVERY DAY AT BEDTIME AS NEEDED FOR SLEEP Patient taking differently: Take 300 mg by mouth at bedtime as needed for sleep. 09/08/20   Melony Overly T, PA-C    Allergies    Gluten meal  Review of Systems   Review of Systems  Constitutional:  Negative for chills and fever.  HENT:  Negative for congestion.   Eyes:  Negative for pain.  Respiratory:  Negative for cough and shortness of breath.   Cardiovascular:  Positive for chest pain. Negative for leg swelling.  Gastrointestinal:  Negative for  abdominal pain, diarrhea, nausea and vomiting.  Genitourinary:  Negative for dysuria.  Musculoskeletal:  Negative for myalgias.  Skin:  Negative for rash.  Neurological:  Negative for dizziness and headaches.   Physical Exam Updated Vital Signs BP 131/83   Pulse 90   Temp 97.9 F (36.6 C) (Oral)   Resp 20   Ht  (1.88 m)   Wt 122.5 kg   SpO2 96%   BMI 34.67 kg/m   Physical Exam Vitals and nursing note reviewed.  Constitutional:      General: He is not in acute distress.    Appearance: He is not ill-appearing.     Comments: Pleasant well-appearing 46 year old.  In no acute distress.  Sitting comfortably in bed.  Able answer questions appropriately follow commands. No increased work of breathing. Speaking in full sentences.  HENT:     Head: Normocephalic and atraumatic.     Nose: Nose normal.  Eyes:     General: No scleral icterus. Cardiovascular:     Rate and Rhythm: Normal rate and regular rhythm.     Pulses: Normal pulses.     Heart sounds: Normal heart sounds.  Pulmonary:     Effort: Pulmonary effort is normal. No respiratory distress.     Breath sounds: No wheezing.     Comments: Lungs are clear to auscultation all fields Abdominal:     Palpations: Abdomen is soft.     Tenderness: There is no abdominal tenderness.     Comments: Abdomen is soft, no guarding rebound or tenderness.  Musculoskeletal:     Cervical back: Normal range of motion.     Right lower leg: No edema.     Left lower leg: No edema.  Skin:    General: Skin is warm and dry.     Capillary Refill: Capillary refill takes less than 2 seconds.  Neurological:     Mental Status: He is alert. Mental status is at baseline.  Psychiatric:        Mood and Affect: Mood normal.  Behavior: Behavior normal.    ED Results / Procedures / Treatments   Labs (all labs ordered are listed, but only abnormal results are displayed) Labs Reviewed  CBC WITH DIFFERENTIAL/PLATELET - Abnormal; Notable for the  following components:      Result Value   WBC 13.9 (*)    Neutro Abs 9.9 (*)    Eosinophils Absolute 0.6 (*)    All other components within normal limits  COMPREHENSIVE METABOLIC PANEL - Abnormal; Notable for the following components:   Sodium 132 (*)    Glucose, Bld 326 (*)    All other components within normal limits  TROPONIN I (HIGH SENSITIVITY)  TROPONIN I (HIGH SENSITIVITY)    EKG None  Radiology DG Chest 2 View  Result Date: 11/08/2020 CLINICAL DATA:  Chest pain EXAM: CHEST - 2 VIEW COMPARISON:  10/13/2020 FINDINGS: The lungs are clear without focal pneumonia, edema, pneumothorax or pleural effusion. Cardiopericardial silhouette is at upper limits of normal for size. The visualized bony structures of the thorax show no acute abnormality. IMPRESSION: No active cardiopulmonary disease. Electronically Signed   By: Kennith Center M.D.   On: 11/08/2020 14:11    Procedures Procedures   Medications Ordered in ED Medications - No data to display  ED Course  I have reviewed the triage vital signs and the nursing notes.  Pertinent labs & imaging results that were available during my care of the patient were reviewed by me and considered in my medical decision making (see chart for details).    MDM Rules/Calculators/A&P                          Patient is 45 year old male with past medical history significant for CAD s/p stents x2.  He also has a history of anxiety and he states that he feels that some of his symptoms may be secondary to anxiety.  He states that an episode of chest pain that started at 3 AM this morning.  He states that it has been persistent since that time.  Denies any nausea or diaphoresis.  He states that his pain is right-sided and seems to not be radiating at this time.  No lightheadedness or dizziness.  Physical exam is notable for no acute abnormalities.  No reproducibility of chest pain.  Troponin x2 within normal limits no delta.  CBC mild leukocytosis  no anemia.  CMP notable for mild hyperglycemia patient states he has not taken his insulin today and plans to.  Patient was initially tachycardic in triage however given Klonopin and nitroglycerin, small dose of fentanyl and aspirin patient's symptoms completely resolved.  He feels much improved.  His tachycardia was not present when I evaluated him.  His resting heart rate is 88 during my evaluation.  He has bilateral radial artery pulses on reexamination.  Chest x-ray without abnormality.  He has no other significant findings on exam.  Briefly discussed my attending physician given the patient's risk factors however close follow-up with cardiology is reasonable.  I offered patient admission which he declined.  Patient agreeable to plan will discharge home at this time.  Recommend that he closely follow his medical regimen and return to ER for any new or concerning symptoms.  RANSOME HELWIG was evaluated in Emergency Department on 11/09/2020 for the symptoms described in the history of present illness. He was evaluated in the context of the global COVID-19 pandemic, which necessitated consideration that the patient might be at risk for  infection with the SARS-CoV-2 virus that causes COVID-19. Institutional protocols and algorithms that pertain to the evaluation of patients at risk for COVID-19 are in a state of rapid change based on information released by regulatory bodies including the CDC and federal and state organizations. These policies and algorithms were followed during the patient's care in the ED.   Final Clinical Impression(s) / ED Diagnoses Final diagnoses:  None    Rx / DC Orders ED Discharge Orders     None        Gailen Shelter, [place] 11/09/20 1441    Vanetta Mulders, MD 11/17/20 1351

## 2020-11-09 NOTE — ED Triage Notes (Signed)
Pt reports RT sided CP that began yesterday. Seen yesterday around 1pm for same and was d/c. Took 1 nitro SL today at 3am with no relief. Pain radiates to RT arm and endorses SOB. Denies other symptoms.

## 2020-11-09 NOTE — ED Triage Notes (Signed)
Pt has been seen 2 different times over the last 2 days for cp . Most recent tis am , 4 negative troponins, back to the ED fwith c/o cp , pt has not been taking his klonopin

## 2020-11-09 NOTE — ED Notes (Signed)
Pt told this RN that he does still smoke about 2 packs of tobacco cigarettes per day.

## 2020-11-10 ENCOUNTER — Telehealth: Payer: Self-pay

## 2020-11-10 NOTE — Telephone Encounter (Signed)
   [facility] Pre-operative Risk Assessment    Patient Name: Matthew [name]  DOB: 12-24-1975  MRN: [id number]   HEARTCARE STAFF: - Please ensure there is not already an duplicate clearance open for this procedure. - Under Visit Info/Reason for Call, type in Other and utilize the format Clearance MM/DD/YY or Clearance TBD. Do not use dashes or single digits. - If request is for dental extraction, please clarify the # of teeth to be extracted. - If the patient is currently at the dentist's office, call Pre-Op APP to address. If the patient is not currently in the dentist office, please route to the Pre-Op pool  Request for surgical clearance:  What type of surgery is being performed? TFESI   When is this surgery scheduled? TBD   What type of clearance is required (medical clearance vs. Pharmacy clearance to hold med vs. Both)? Pharmacy   Are there any medications that need to be held prior to surgery and how long? Brilinta 5 days    Practice name and name of physician performing surgery? [facility] Dr Matthew Lees, MD   What is the office phone number? [phone]   7.   What is the office fax number? [phone]  8.   Anesthesia type (None, local, MAC, general) ? None specified   Matthew [name] 11/10/2020, 3:10 PM  _________________________________________________________________   (provider comments below)

## 2020-11-11 NOTE — Telephone Encounter (Signed)
Primary Cardiologist:Heather Becky Sax, MD  Chart reviewed as part of pre-operative protocol coverage. Because of Matthew [name]'s past medical history and time since last visit, he/she will require a follow-up visit in order to better assess preoperative cardiovascular risk.  Pre-op covering staff: - Please schedule appointment and call patient to inform them. - Please contact requesting surgeon's office via preferred method (i.e, phone, fax) to inform them of need for appointment prior to surgery.  If applicable, this message will also be routed to pharmacy pool and/or primary cardiologist for input on holding anticoagulant/antiplatelet agent as requested below so that this information is available at time of patient's appointment.   Ronney Asters, NP  11/11/2020, 7:38 AM

## 2020-11-11 NOTE — Telephone Encounter (Signed)
Pt has appt with Tereso Newcomer, [facility] 11/30/20 for pre op clearance. Will send notes to [facility] for upcoming appt. Will send FYI to surgeon's office pt has appt 7/13.

## 2020-11-16 ENCOUNTER — Ambulatory Visit (INDEPENDENT_AMBULATORY_CARE_PROVIDER_SITE_OTHER): Payer: Medicaid Other | Admitting: Internal Medicine

## 2020-11-16 ENCOUNTER — Encounter: Payer: Self-pay | Admitting: Internal Medicine

## 2020-11-16 ENCOUNTER — Other Ambulatory Visit: Payer: Self-pay

## 2020-11-16 VITALS — BP 134/88 | HR 113 | Ht 74.0 in | Wt 267.0 lb

## 2020-11-16 DIAGNOSIS — E782 Mixed hyperlipidemia: Secondary | ICD-10-CM

## 2020-11-16 DIAGNOSIS — E1165 Type 2 diabetes mellitus with hyperglycemia: Secondary | ICD-10-CM | POA: Diagnosis not present

## 2020-11-16 LAB — POCT GLYCOSYLATED HEMOGLOBIN (HGB A1C): Hemoglobin A1C: 9.2 % — AB (ref 4.0–5.6)

## 2020-11-16 MED ORDER — TRESIBA FLEXTOUCH 100 UNIT/ML ~~LOC~~ SOPN: 20.0000 [IU] | PEN_INJECTOR | Freq: Every day | SUBCUTANEOUS | 11 refills | Status: DC

## 2020-11-16 NOTE — Patient Instructions (Signed)
-   Continue Metformin 1000 mg , 1 tablet before Breakfast and 1 tablet before Supper  - Continue  Jardiance 25 mg, 1 tablet before Breakfast  - Increase Basaglar to 20  units once daily      HOW TO TREAT LOW BLOOD SUGARS (Blood sugar LESS THAN 70 MG/DL) Please follow the RULE OF 15 for the treatment of hypoglycemia treatment (when your (blood sugars are less than 70 mg/dL)   STEP 1: Take 15 grams of carbohydrates when your blood sugar is low, which includes:  3-4 GLUCOSE TABS  OR 3-4 OZ OF JUICE OR REGULAR SODA OR ONE TUBE OF GLUCOSE GEL    STEP 2: RECHECK blood sugar in 15 MINUTES STEP 3: If your blood sugar is still low at the 15 minute recheck --> then, go back to STEP 1 and treat AGAIN with another 15 grams of carbohydrates.

## 2020-11-16 NOTE — Progress Notes (Signed)
Name: Matthew [name]  Age/ Sex: 45 y.o., male   MRN/ DOB: [id number], October 31, 1975     PCP: [name], MD   Reason for Endocrinology Evaluation: Type 2 Diabetes Mellitus  Initial Endocrine Consultative Visit: 08/15/2020    PATIENT IDENTIFIER: [name] is a 45 y.o. male with a past medical history of T2DN, CAD , OSA and schizoaffective d/o. The patient has followed with Endocrinology clinic since 08/15/2020 for consultative assistance with management of his diabetes.  DIABETIC HISTORY:  [name] was diagnosed with DM 2021,he has been on metformin and jardiance . His hemoglobin A1c has ranged from 6.4% in 2020, peaking at 12.8% in 2022.  Lives alone    Pt on disability for mental health  SUBJECTIVE:   During the last visit (08/15/2020): A1c 12.0 % , we continued metformin, Jardiance and  started insulin   Today (11/16/2020): [name] is here for a diabetes management.  He checks his blood sugars 0 times daily.     HOME DIABETES REGIMEN:  Metformin 1000 mg , 1 tablet before Breakfast and 1 tablet before Supper Jardiance 25 mg, 1 tablet before Breakfast Basaglar 16 units daily   Statin: yes ACE-I/ARB: yes   METER DOWNLOAD SUMMARY: Does not check    DIABETIC COMPLICATIONS: Microvascular complications:   Denies: CKD, retinopathy, neuropathy  Last Eye Exam: Completed   Macrovascular complications:  CAD Denies: CVA, PVD   HISTORY:  Past Medical History:  Past Medical History:  Diagnosis Date  . Anxiety   . Celiac disease   . Coronary artery disease   . Dairy allergy   . Depression   . Diabetes mellitus without complication (HCC)   . High cholesterol   . Hypertension   . Paranoia (HCC)   . Pre-diabetes   . Schizoaffective disorder [facility])    Past Surgical History:  Past Surgical History:  Procedure Laterality Date  . APPENDECTOMY    . BRAIN SURGERY    . CARDIAC CATHETERIZATION    . CORONARY STENT INTERVENTION N/A 06/06/2020   Procedure: CORONARY  STENT INTERVENTION;  Surgeon: Swaziland, Peter M, MD;  Location: [facility] INVASIVE CV LAB;  Service: Cardiovascular;  Laterality: N/A;  prox LAD, distal RCA  . INTRAVASCULAR ULTRASOUND/IVUS N/A 06/06/2020   Procedure: Intravascular Ultrasound/IVUS;  Surgeon: Swaziland, Peter M, MD;  Location: [facility] INVASIVE CV LAB;  Service: Cardiovascular;  Laterality: N/A;  . LEFT HEART CATH AND CORONARY ANGIOGRAPHY N/A 06/06/2020   Procedure: LEFT HEART CATH AND CORONARY ANGIOGRAPHY;  Surgeon: Swaziland, Peter M, MD;  Location: [facility] INVASIVE CV LAB;  Service: Cardiovascular;  Laterality: N/A;  . NASAL SEPTUM SURGERY    . WRIST FRACTURE SURGERY     right wrist   Social History:  reports that he has been smoking cigarettes. He has been smoking an average of 1.00 packs per day. He has never used smokeless tobacco. He reports that he does not drink alcohol and does not use drugs. Family History:  Family History  Problem Relation Age of Onset  . Asthma Mother   . Hypertension Mother   . Hypertension Father      HOME MEDICATIONS: Allergies as of 11/16/2020       Reactions   Gluten Meal Other (See Comments)   Celiac disease        Medication List        Accurate as of November 16, 2020  1:40 PM. If you have any questions, ask your nurse or doctor.  aspirin EC 81 MG tablet Take 1 tablet (81 mg total) by mouth daily. Swallow whole.   atorvastatin 40 MG tablet Commonly known as: LIPITOR Take 40 mg by mouth daily.   carvedilol 25 MG tablet Commonly known as: COREG Take 1 tablet (25 mg total) by mouth 2 (two) times daily.   clonazePAM 1 MG tablet Commonly known as: KLONOPIN Take 1 tablet (1 mg total) by mouth 3 (three) times daily as needed for anxiety.   DULoxetine 60 MG capsule Commonly known as: CYMBALTA Take 1 capsule (60 mg total) by mouth 2 (two) times daily.   empagliflozin 25 MG Tabs tablet Commonly known as: Jardiance Take 1 tablet (25 mg total) by mouth daily before breakfast.   gabapentin  800 MG tablet Commonly known as: NEURONTIN 1 po q am, 1/2 po at noon, 1 po at 4 pm, and 1 po at bedtime.   Insulin Pen Needle 32G X 4 MM Misc 1 Device by Does not apply route daily.   isosorbide mononitrate 30 MG 24 hr tablet Commonly known as: IMDUR Take 0.5 tablets (15 mg total) by mouth daily.   lisinopril 20 MG tablet Commonly known as: ZESTRIL Take 1 tablet (20 mg total) by mouth daily.   lithium carbonate 300 MG CR tablet Commonly known as: LITHOBID TAKE 2 TABLETS (600 MG TOTAL) BY MOUTH AT BEDTIME.   metFORMIN 1000 MG tablet Commonly known as: GLUCOPHAGE Take 1 tablet (1,000 mg total) by mouth 2 (two) times daily with a meal.   nitroGLYCERIN 0.4 MG SL tablet Commonly known as: Nitrostat Place 1 tablet (0.4 mg total) under the tongue every 5 (five) minutes as needed for chest pain.   perphenazine 16 MG tablet Commonly known as: TRILAFON TAKE 1 PILL TOTAL 16 MG EVERY MORNING AND TAKE 2 PILLS BY MOUTH TOTAL 32 MG EVERY BEDTIME What changed: See the new instructions.   pregabalin 100 MG capsule Commonly known as: LYRICA Take 100 mg by mouth 2 (two) times daily.   spironolactone 25 MG tablet Commonly known as: ALDACTONE Take 0.5 tablets (12.5 mg total) by mouth daily.   ticagrelor 90 MG Tabs tablet Commonly known as: BRILINTA Take 1 tablet (90 mg total) by mouth 2 (two) times daily.   traZODone 100 MG tablet Commonly known as: DESYREL TAKE 3 TABLETS BY MOUTH EVERY DAY AT BEDTIME AS NEEDED FOR SLEEP What changed:  how much to take how to take this when to take this reasons to take this additional instructions   Tresiba FlexTouch 100 UNIT/ML FlexTouch Pen Generic drug: insulin degludec Inject 16 Units into the skin daily.         OBJECTIVE:   Vital Signs: There were no vitals taken for this visit.  Wt Readings from Last 3 Encounters:  11/09/20 270 lb (122.5 kg)  11/08/20 275 lb (124.7 kg)  10/13/20 270 lb (122.5 kg)     Exam: General: Pt  appears well and is in NAD  Lungs: Clear with good BS bilat with no rales, rhonchi, or wheezes  Heart: RRR with normal S1 and S2 and no gallops; no murmurs; no rub  Abdomen: Normoactive bowel sounds, soft, nontender, without masses or organomegaly palpable  Extremities: No pretibial edema.   Neuro: MS is good with appropriate affect, pt is alert and Ox3    DM foot exam: 08/15/2020   The skin of the feet is without sores or ulcerations, callous formation at the heels The pedal pulses are undetectable  The sensation is intact  to a screening 5.07, 10 gram monofilament bilaterally       DATA REVIEWED:  Lab Results  Component Value Date   HGBA1C 12.3 (A) 08/15/2020   HGBA1C 12.8 (H) 06/06/2020   HGBA1C 6.4 (H) 10/21/2018   Lab Results  Component Value Date   LDLCALC UNABLE TO CALCULATE IF TRIGLYCERIDE OVER 400 mg/dL 33/35/4562   CREATININE 1.02 11/09/2020         Results for Matthew [name], Matthew [name] (MRN [id number]) as of 11/16/2020 14:45  Ref. Range 06/06/2020 20:32 08/15/2020 15:26  Total CHOL/HDL Ratio Unknown NOT REPORTED DUE TO HIGH TRIGLYCERIDES 6  Cholesterol Latest Ref Range: 0 - 200 mg/dL 287 (H) 681  HDL Cholesterol Latest Ref Range: >39.00 mg/dL NOT REPORTED DUE TO HIGH TRIGLYCERIDES 23.50 (L)  LDL (calc) Latest Ref Range: 0 - 99 mg/dL UNABLE TO CALCULATE IF TRIGLYCERIDE OVER 400 mg/dL   Direct LDL Latest Units: mg/dL 15.7 26.2  NonHDL Unknown  109.53  Triglycerides Latest Ref Range: 0.0 - 149.0 mg/dL 0,355 (H) 974.1 (H)  VLDL Latest Ref Range: 0.0 - 40.0 mg/dL UNABLE TO CALCULATE IF TRIGLYCERIDE OVER 400 mg/dL 63.8 (H)    ASSESSMENT / PLAN / RECOMMENDATIONS:   1) Type 2 Diabetes Mellitus, Poorly controlled, Without complications - Most recent A1c of 9.2 %. Goal A1c < 7.0 %.     - A1c down from 12.0%  - I have praised the pt on improved glycemic control  - No glucose data , barrier to diabetes self care is mental illness  - Will increase basal rate as below      MEDICATIONS: Continue Metformin 1000 mg , 1 tablet before Breakfast and 1 tablet before Supper  - Continue  Jardiance 25 mg, 1 tablet before Breakfast  - Increase Basaglar to 20  units once daily   EDUCATION / INSTRUCTIONS: BG monitoring instructions: Patient is instructed to check his blood sugars 1 times a day. Call [place] [facility] if: BG persistently < 70  I reviewed the Rule of 15 for the treatment of hypoglycemia in detail with the patient. Literature supplied.   2) Diabetic complications:  Eye: Does not have known diabetic retinopathy.  Neuro/ Feet: Does not have known diabetic peripheral neuropathy .  Renal: Patient does not have known baseline CKD. He   is  on an ACEI/ARB at present.   3) Hypertriglyceridemia   - Tg level as high as 2311 mg/dL in 08/5362, repeat down to 377 mg/dL I expect this will continue to imprve with improved glycemic control     Continue atorvastatin 40 mg daily      F/U in 6 months     Signed electronically by: Lyndle Herrlich, MD  [facility] Endocrinology  [facility] [address] [place], [place] [address] Phone: [phone] FAX: [phone]   CC: [name], MD [address] [place] [address] Phone: [phone]  Fax: [phone]  Return to Endocrinology clinic as below: Future Appointments  Date Time Provider Department Center  11/16/2020  2:20 PM Rick Warnick, Konrad Dolores, MD [facility] None  11/30/2020  9:15 AM Tereso Newcomer T, PA-C [facility]  12/01/2020  1:00 PM Cherie Ouch, PA-C [facility] None

## 2020-11-29 NOTE — Progress Notes (Signed)
Cardiology Office Note:    Date:  11/30/2020   ID:  Matthew [name], DOB December 18, 1975, MRN [id number]  PCP:  [name], MD   [facility] Providers Cardiologist:  Meriam Sprague, MD      Referring MD: [name], MD   Chief Complaint:  Follow-up (CAD; ED visits for chest pain )    Patient Profile:    Matthew [name] is a 45 y.o. male with:  Coronary artery disease NSTEMI in 05/2020 s/p DES to pLAD and DES to dRCA  Left AMA after MI Intermittent non-adherence to med Rx  HFimpEF (heart failure with improved ejection fraction)  Ischemic CM Cath 1/22: EF 25-30 Echocardiogram 3/22: EF 65-70  Palpitations Monitor 2/22: rare PVCs; no significant arrhythmias  Hypertension  Hyperlipidemia  Pre-diabetes mellitus  Celiac disease Schizoaffective disorder Anxiety  ADHD OSA - not on CPAP Managed by pulmonology  +Cigs  Prior CV studies: Echocardiogram 08/12/20 EF 65-70, no RWMA, mild LVH, normal RVSF  Cardiac monitor 06/2020 Patient had a min HR of 55 bpm, max HR of 135 bpm, and avg HR of 88 bpm. Predominant underlying rhythm was Sinus Rhythm. No Isolated SVEs, SVE Couplets, or SVE Triplets were present. Isolated VEs were rare (<1.0%), VE Couplets were rare (<1.0%), and no VE Triplets were present.   Overall, no significant arrhythmias detected on the monitor   LEFT HEART CATH 06/06/2020 Narrative  Dist RCA lesion is 90% stenosed.  A drug-eluting stent was successfully placed using a STENT RESOLUTE ONYX 3.5X15.  Post intervention, there is a 0% residual stenosis.  Prox LAD lesion is 99% stenosed.  A drug-eluting stent was successfully placed using a STENT RESOLUTE ONYX 3.5X18.  Post intervention, there is a 0% residual stenosis.  There is severe left ventricular systolic dysfunction.  LV end diastolic pressure is moderately elevated.  The left ventricular ejection fraction is 25-35% by visual estimate.  1. Severe 2 vessel obstructive CAD - 99% proximal  LAD - 90% distal RCA 2. Severe LV dysfunction. EF estimated at 25-30%. Segmental wall motion abnormalities. 3. Moderately elevated LVEDP 4. Successful PCI of the proximal LAD with IVUS guidance and DES x 1 5. Successful PCI of the distal RCA with IVUS guidance and DES x 1  Plan: DAPT for one year. Optimal medical therapy for LV dysfunction. Aggressive risk factor modification.     History of Present Illness: Matthew [name] was last seen by Dr. Shari Prows in 09/2020.  Carvedilol was increased at that visit.  He has had several visits to the ED with chest pain since his MI.  He was seen in the ED 3 days after his appt with Dr. Shari Prows in May with chest pain.  His hs-Trops were 4>>5.  His HR was elevated and he was given IVFs as well as Toradol, Ativan with improvement.  He had another visit the ED with chest pain 11/08/20.  His hs-Trops were 4>>4 (notes indicate he had several hours of chest pain prior to presentation).  He had a return visit on 6/22 and his hs-Trops were 5>>4>>4.  He returns for f/u.  Of note, he is in need of epidural steroid injections with Dr. Jake Church at [facility].  Request is for Ticagrelor to be held for 5 days.    He is here alone today.  He was drinking an energy drink upon entering the clinic.  He also has not taken his medications yet today.  He usually takes them with food.  He notes that the chest  discomfort he has had that brings him to the emergency room is random.  It is not exertional.  It is sharp and right-sided.  It is not like his previous angina with his myocardial infarction.  He has shortness of breath at times.  He has not had orthopnea, leg edema, syncope.  He does have significant cough related to smoking and has had some near syncope related to cough in the past.    Past Medical History:  Diagnosis Date   Anxiety    Celiac disease    Coronary artery disease    Dairy allergy    Depression    Diabetes mellitus without complication (HCC)    High cholesterol     Hypertension    Paranoia (HCC)    Pre-diabetes    Schizoaffective disorder (HCC)     Current Medications: Current Meds  Medication Sig   aspirin EC 81 MG tablet Take 1 tablet (81 mg total) by mouth daily. Swallow whole.   atorvastatin (LIPITOR) 40 MG tablet Take 40 mg by mouth daily.   carvedilol (COREG) 25 MG tablet Take 1 tablet (25 mg total) by mouth 2 (two) times daily.   clonazePAM (KLONOPIN) 1 MG tablet Take 1 tablet (1 mg total) by mouth 3 (three) times daily as needed for anxiety.   DULoxetine (CYMBALTA) 60 MG capsule Take 1 capsule (60 mg total) by mouth 2 (two) times daily.   empagliflozin (JARDIANCE) 25 MG TABS tablet Take 1 tablet (25 mg total) by mouth daily before breakfast.   gabapentin (NEURONTIN) 800 MG tablet 1 po q am, 1/2 po at noon, 1 po at 4 pm, and 1 po at bedtime.   insulin degludec (TRESIBA FLEXTOUCH) 100 UNIT/ML FlexTouch Pen Inject 20 Units into the skin daily.   Insulin Pen Needle 32G X 4 MM MISC 1 Device by Does not apply route daily.   isosorbide mononitrate (IMDUR) 30 MG 24 hr tablet Take 0.5 tablets (15 mg total) by mouth daily.   lisinopril (ZESTRIL) 20 MG tablet Take 1 tablet (20 mg total) by mouth daily.   lithium carbonate (LITHOBID) 300 MG CR tablet TAKE 2 TABLETS (600 MG TOTAL) BY MOUTH AT BEDTIME.   metFORMIN (GLUCOPHAGE) 1000 MG tablet Take 1 tablet (1,000 mg total) by mouth 2 (two) times daily with a meal.   nitroGLYCERIN (NITROSTAT) 0.4 MG SL tablet Place 1 tablet (0.4 mg total) under the tongue every 5 (five) minutes as needed for chest pain.   oxyCODONE-acetaminophen (PERCOCET) 10-325 MG tablet SMARTSIG:0.5-1 Tablet(s) By Mouth Every 8-12 Hours PRN   perphenazine (TRILAFON) 16 MG tablet TAKE 1 PILL TOTAL 16 MG EVERY MORNING AND TAKE 2 PILLS BY MOUTH TOTAL 32 MG EVERY BEDTIME   spironolactone (ALDACTONE) 25 MG tablet Take 0.5 tablets (12.5 mg total) by mouth daily.   ticagrelor (BRILINTA) 90 MG TABS tablet Take 1 tablet (90 mg total) by mouth 2  (two) times daily.   traZODone (DESYREL) 100 MG tablet TAKE 3 TABLETS BY MOUTH EVERY DAY AT BEDTIME AS NEEDED FOR SLEEP     Allergies:   Gluten meal   Social History   Tobacco Use   Smoking status: Heavy Smoker    Packs/day: 1.00    Pack years: 0.00    Types: Cigarettes   Smokeless tobacco: Never   Tobacco comments:    patient given phone number to 1-800-quit now on 08/16/20  Vaping Use   Vaping Use: Never used  Substance Use Topics   Alcohol use: No  Alcohol/week: 0.0 standard drinks    Comment: no (02/08/15)   Drug use: No     Family Hx: The patient's family history includes Asthma in his mother; Hypertension in his father and mother.  Review of Systems  Gastrointestinal:  Negative for hematochezia.  Genitourinary:  Negative for hematuria.    EKGs/Labs/Other Test Reviewed:    EKG:  EKG is   ordered today.  The ekg ordered today demonstrates sinus tachycardia, HR 112, right axis deviation, inferior and anterolateral Q waves, QTC 436, similar to prior tracings  Recent Labs: 06/06/2020: TSH 2.644 06/07/2020: B Natriuretic Peptide 151.1; Magnesium 1.9 11/09/2020: ALT 40; BUN 15; Creatinine, Ser 1.02; Hemoglobin 16.0; Platelets 244; Potassium 4.0; Sodium 134   Recent Lipid Panel Lab Results  Component Value Date/Time   CHOL 133 08/15/2020 03:26 PM   TRIG 377.0 (H) 08/15/2020 03:26 PM   HDL 23.50 (L) 08/15/2020 03:26 PM   LDLCALC UNABLE TO CALCULATE IF TRIGLYCERIDE OVER 400 mg/dL 16/10/960401/17/2022 54:0908:32 PM   LDLDIRECT 68.0 08/15/2020 03:26 PM      Risk Assessment/Calculations:      Physical Exam:    VS:  BP 102/78   Pulse (!) 114   Ht 6\' 2"  (1.88 m)   Wt 272 lb 6.4 oz (123.6 kg)   SpO2 97%   BMI 34.97 kg/m     Wt Readings from Last 3 Encounters:  11/30/20 272 lb 6.4 oz (123.6 kg)  11/16/20 267 lb (121.1 kg)  11/09/20 270 lb (122.5 kg)     Constitutional:      Appearance: Healthy appearance. Not in distress.  Neck:     Vascular: JVD normal.  Pulmonary:      Effort: Pulmonary effort is normal.     Breath sounds: No wheezing. No rales.  Cardiovascular:     Tachycardia present. Regular rhythm. Normal S1. Normal S2.      Murmurs: There is no murmur.  Edema:    Peripheral edema absent.  Abdominal:     Palpations: Abdomen is soft.  Skin:    General: Skin is warm and dry.  Neurological:     General: No focal deficit present.     Mental Status: Alert and oriented to person, place and time.     Cranial Nerves: Cranial nerves are intact.         ASSESSMENT & PLAN:    1. Coronary artery disease involving native coronary artery of native heart with angina pectoris (HCC) 2. Precordial pain History of non-STEMI 05/2020 treated with DES to the LAD and RCA.  He has not had recurrent symptoms reminiscent of his previous angina.  However, he has had several visits to the emergency room with chest pain.  I reviewed his case today with Dr. Shari ProwsPemberton.  We think that a nuclear stress test would likely give us images that are useful.  Therefore, I will arrange a Lexiscan Myoview.  Continue current dose of aspirin, atorvastatin, carvedilol, isosorbide mononitrate, lisinopril, spironolactone, ticagrelor.  Follow-up in 6 months.    3. Sinus tachycardia Likely related to smoking and high caffeine intake.  I have asked him to quit smoking as well as to reduce caffeine intake.  4. HFimpEF (heart failure with improved EF) EF was 25-30 at the time of his myocardial infarction.  This improved and look normal by echocardiogram March 2022.  Continue current dose of carvedilol, lisinopril, spironolactone, empagliflozin.  5. Essential hypertension The patient's blood pressure is controlled on his current regimen.  Continue current therapy.  6. Mixed dyslipidemia LDL optimal.  Triglycerides elevated in March 2022.  We discussed the importance of limiting simple sugars to reduce triglycerides.  He will likely need a follow-up lipid panel arranged at his next visit with a  consideration of Vascepa if his triglycerides remain elevated.   7. Surgical clearance As noted the patient had a non-STEMI 05/2020.  He should remain on uninterrupted dual antiplatelet therapy with aspirin and ticagrelor (Brilinta) for minimum of 12 months post MI.  I explained to the patient that he cannot hold Brilinta until at least January 2023.  His procedure will need to be postponed until that time.  Shared Decision Making/Informed Consent The risks [chest pain, shortness of breath, cardiac arrhythmias, dizziness, blood pressure fluctuations, myocardial infarction, stroke/transient ischemic attack, nausea, vomiting, allergic reaction, radiation exposure, metallic taste sensation and life-threatening complications (estimated to be 1 in 10,000)], benefits (risk stratification, diagnosing coronary artery disease, treatment guidance) and alternatives of a nuclear stress test were discussed in detail with Matthew [name] and he agrees to proceed.    Dispo:  Return in about 6 months (around 06/02/2021) for Routine follow up in 6 months with Dr. Shari Prows or Tereso Newcomer, PA-C..   Medication Adjustments/Labs and Tests Ordered: Current medicines are reviewed at length with the patient today.  Concerns regarding medicines are outlined above.  Tests Ordered: Orders Placed This Encounter  Procedures   Cardiac Stress Test: Informed Consent Details: Physician/Practitioner Attestation; Transcribe to consent form and obtain patient signature   Myocardial Perfusion Imaging   EKG 12-Lead   Medication Changes: No orders of the defined types were placed in this encounter.   Signed, Tereso Newcomer, PA-C  11/30/2020 4:57 PM    [facility] [address] [place], [place], [place]  [address] Phone: [phone]; Fax: [phone]

## 2020-11-30 ENCOUNTER — Ambulatory Visit: Payer: Medicaid Other | Admitting: Physician Assistant

## 2020-11-30 ENCOUNTER — Other Ambulatory Visit: Payer: Self-pay

## 2020-11-30 ENCOUNTER — Encounter: Payer: Self-pay | Admitting: Physician Assistant

## 2020-11-30 VITALS — BP 102/78 | HR 114 | Ht 74.0 in | Wt 272.4 lb

## 2020-11-30 DIAGNOSIS — R000 Tachycardia, unspecified: Secondary | ICD-10-CM

## 2020-11-30 DIAGNOSIS — E782 Mixed hyperlipidemia: Secondary | ICD-10-CM | POA: Diagnosis not present

## 2020-11-30 DIAGNOSIS — I5032 Chronic diastolic (congestive) heart failure: Secondary | ICD-10-CM

## 2020-11-30 DIAGNOSIS — Z01810 Encounter for preprocedural cardiovascular examination: Secondary | ICD-10-CM

## 2020-11-30 DIAGNOSIS — I10 Essential (primary) hypertension: Secondary | ICD-10-CM

## 2020-11-30 DIAGNOSIS — I25119 Atherosclerotic heart disease of native coronary artery with unspecified angina pectoris: Secondary | ICD-10-CM

## 2020-11-30 DIAGNOSIS — F411 Generalized anxiety disorder: Secondary | ICD-10-CM

## 2020-11-30 DIAGNOSIS — Z01818 Encounter for other preprocedural examination: Secondary | ICD-10-CM | POA: Diagnosis not present

## 2020-11-30 DIAGNOSIS — R072 Precordial pain: Secondary | ICD-10-CM | POA: Diagnosis not present

## 2020-11-30 DIAGNOSIS — E1165 Type 2 diabetes mellitus with hyperglycemia: Secondary | ICD-10-CM

## 2020-11-30 NOTE — Patient Instructions (Signed)
Medication Instructions:   Your physician recommends that you continue on your current medications as directed. Please refer to the Current Medication list given to you today.  *If you need a refill on your cardiac medications before your next appointment, please call your pharmacy*  Lab Work:  -NONE  If you have labs (blood work) drawn today and your tests are completely normal, you will receive your results only by: MyChart Message (if you have MyChart) OR A paper copy in the mail If you have any lab test that is abnormal or we need to change your treatment, we will call you to review the results.   Testing/Procedures:  Your physician has requested that you have a lexiscan myoview. For further information please visit [email or web address] Please follow instruction sheet, as given.  You are scheduled for a Myocardial Perfusion Imaging Study on    Wednesday, July 27 at  7:45 am.   Please arrive 15 minutes prior to your appointment time for registration and insurance purposes.   The test will take approximately 3 to 4 hours to complete; you may bring reading material. If someone comes with you to your appointment, they will need to remain in the main lobby due to limited space in the testing area.   How to prepare for your Myocardial Perfusion test:   Do not eat or drink 3 hours prior to your test, except you may have water.    Do not consume products containing caffeine (regular or decaffeinated) 12 hours prior to your test (ex: coffee, chocolate, soda, tea)   Do bring a list of your current medications with you. If not listed below, you may take your medications as normal.    Bring any held medication to your appointment, as you may be required to take it once the test is complete.   Do wear comfortable clothes (no overalls) and walking shoes. Tennis shoes are preferred. No open toed shoes.  Do not wear cologne, aftershave or lotions (deodorant is allowed).   If these  instructions are not followed, you test will have to be rescheduled.   Please report to [address] for your test. If you have questions or concerns about your appointment, please call the Nuclear Lab at #[phone].  If you cannot keep your appointment, please provide 24 hour notification to the Nuclear lab to avoid a possible $50 charge to your account.    Follow-Up: At [facility], you and your health needs are our priority.  As part of our continuing mission to provide you with exceptional heart care, we have created designated Provider Care Teams.  These Care Teams include your primary Cardiologist (physician) and Advanced Practice Providers (APPs -  Physician Assistants and Nurse Practitioners) who all work together to provide you with the care you need, when you need it.  We recommend signing up for the patient portal called "MyChart".  Sign up information is provided on this After Visit Summary.  MyChart is used to connect with patients for Virtual Visits (Telemedicine).  Patients are able to view lab/test results, encounter notes, upcoming appointments, etc.  Non-urgent messages can be sent to your provider as well.   To learn more about what you can do with MyChart, go to [email or web address].    Your next appointment:   6 month(s)  The format for your next appointment:   In Person  Provider:   You may see Meriam Sprague, MD or one of the following Advanced Practice Providers on  your designated Care Team:   Tereso Newcomer, PA-C   Other Instructions Your physician wants you to follow-up in: 6 month with Dr. Shari Prows or Tereso Newcomer, PA-C.  You will receive a reminder letter in the mail two months in advance. If you don't receive a letter, please call our office to schedule the follow-up appointment.

## 2020-11-30 NOTE — Telephone Encounter (Signed)
Notes faxed to surgeon.  Tereso Newcomer, PA-C  11/30/2020 10:17 AM

## 2020-11-30 NOTE — Telephone Encounter (Signed)
   Name:  Matthew [name]  DOB:  [birth year]  MRN:  [id number]   Primary Cardiologist: Meriam Sprague, MD  Pt was seen for office visit today.  He had a myocardial infarction in 05/2020 and underwent coronary stenting x 2.  He needs to remain on uninterrupted dual antiplatelet therapy for a minimum of 12 mos post MI.  He will need to remain on both ASA and Brilinta.  His procedure will need to be postponed until at least January 2023.    Tereso Newcomer, PA-C 11/30/2020, 10:13 AM

## 2020-12-01 ENCOUNTER — Ambulatory Visit: Payer: Medicaid Other | Admitting: Physician Assistant

## 2020-12-06 ENCOUNTER — Other Ambulatory Visit: Payer: Self-pay | Admitting: Physician Assistant

## 2020-12-06 DIAGNOSIS — F250 Schizoaffective disorder, bipolar type: Secondary | ICD-10-CM

## 2020-12-07 ENCOUNTER — Other Ambulatory Visit: Payer: Self-pay

## 2020-12-07 ENCOUNTER — Telehealth: Payer: Self-pay | Admitting: Physician Assistant

## 2020-12-07 DIAGNOSIS — F411 Generalized anxiety disorder: Secondary | ICD-10-CM

## 2020-12-07 MED ORDER — GABAPENTIN 800 MG PO TABS: ORAL_TABLET | ORAL | 1 refills | Status: DC

## 2020-12-07 MED ORDER — CLONAZEPAM 1 MG PO TABS: 1.0000 mg | ORAL_TABLET | Freq: Three times a day (TID) | ORAL | 1 refills | Status: DC | PRN

## 2020-12-07 NOTE — Telephone Encounter (Signed)
Pt said that he needs a refill on his cymbalta and trazadone. Please send it in

## 2020-12-08 ENCOUNTER — Telehealth (HOSPITAL_COMMUNITY): Payer: Self-pay | Admitting: *Deleted

## 2020-12-08 ENCOUNTER — Other Ambulatory Visit: Payer: Self-pay

## 2020-12-08 DIAGNOSIS — F250 Schizoaffective disorder, bipolar type: Secondary | ICD-10-CM

## 2020-12-08 DIAGNOSIS — F902 Attention-deficit hyperactivity disorder, combined type: Secondary | ICD-10-CM

## 2020-12-08 DIAGNOSIS — F411 Generalized anxiety disorder: Secondary | ICD-10-CM

## 2020-12-08 MED ORDER — DULOXETINE HCL 60 MG PO CPEP: 60.0000 mg | ORAL_CAPSULE | Freq: Two times a day (BID) | ORAL | 0 refills | Status: DC

## 2020-12-08 MED ORDER — TRAZODONE HCL 100 MG PO TABS: ORAL_TABLET | ORAL | 0 refills | Status: DC

## 2020-12-08 NOTE — Telephone Encounter (Signed)
Patient given detailed instructions per Myocardial Perfusion Study Information Sheet for the test on 12/14/20 Patient notified to arrive 15 minutes early and that it is imperative to arrive on time for appointment to keep from having the test rescheduled.  If you need to cancel or reschedule your appointment, please call the office within 24 hours of your appointment. . Patient verbalized understanding. Cally Nygard Jacqueline   

## 2020-12-08 NOTE — Telephone Encounter (Signed)
Rxs sent

## 2020-12-14 ENCOUNTER — Encounter (HOSPITAL_COMMUNITY): Payer: Medicaid Other

## 2020-12-14 ENCOUNTER — Telehealth (HOSPITAL_COMMUNITY): Payer: Self-pay | Admitting: Physician Assistant

## 2020-12-14 NOTE — Telephone Encounter (Signed)
[place], [place] 12/14/20 10:46

## 2020-12-14 NOTE — Telephone Encounter (Signed)
Patient called and cancelled Myoview for the reason below:  12/14/2020 8:35 AM PP:JKDTO, Montel Clock E  Cancel Rsn: Patient (states he is having back issues and unable to move/will call back to reschedule) Order will be removed from the [facility] and when patient calls back to reschedule we will reinstate the order.

## 2021-01-02 ENCOUNTER — Other Ambulatory Visit: Payer: Self-pay | Admitting: Sports Medicine

## 2021-01-02 ENCOUNTER — Other Ambulatory Visit: Payer: Self-pay | Admitting: Internal Medicine

## 2021-01-02 DIAGNOSIS — E1165 Type 2 diabetes mellitus with hyperglycemia: Secondary | ICD-10-CM

## 2021-01-02 DIAGNOSIS — M545 Low back pain, unspecified: Secondary | ICD-10-CM

## 2021-01-10 ENCOUNTER — Other Ambulatory Visit: Payer: Self-pay

## 2021-01-10 ENCOUNTER — Ambulatory Visit
Admission: RE | Admit: 2021-01-10 | Discharge: 2021-01-10 | Disposition: A | Discharge status: Home / Self Care | Payer: Medicaid Other | Source: Ambulatory Visit | Attending: Sports Medicine | Admitting: Sports Medicine

## 2021-01-10 DIAGNOSIS — M545 Low back pain, unspecified: Secondary | ICD-10-CM

## 2021-01-10 IMAGING — MR MR LUMBAR SPINE W/O CM
4 of 5 series · 19 of 48 positions shown · non-contrast
Comparison: None.

CLINICAL DATA: Low back pain

EXAM:
MRI LUMBAR SPINE WITHOUT CONTRAST
TECHNIQUE: Multiplanar, multisequence MR imaging of the lumbar spine was
performed. No intravenous contrast was administered.

[Series 6: T2 · sagittal · 4.0mm · 0.73mm/px · 6 of 18 slices shown (1 of 2)]
[im 1/18]
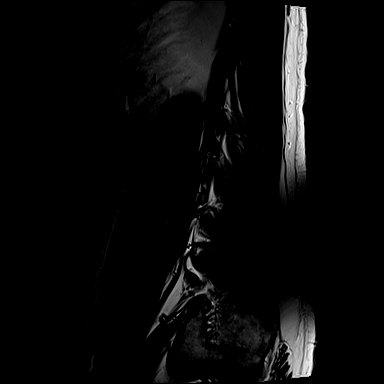
[im 4/18]
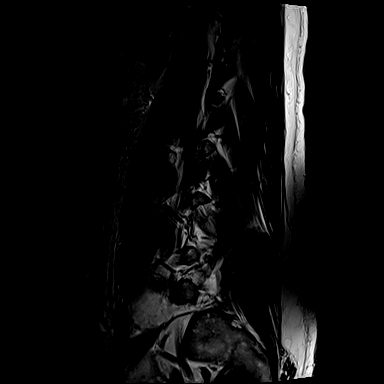
[im 7/18]
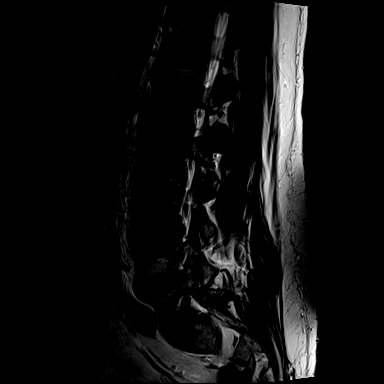
[im 11/18]
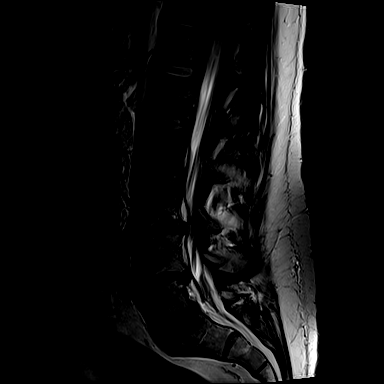
[im 14/18]
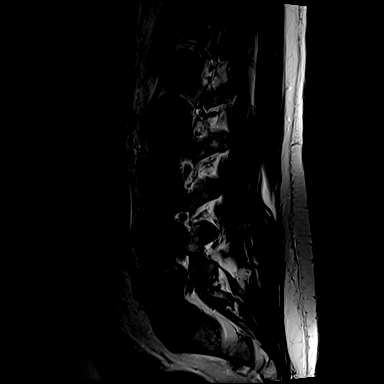
[im 18/18]
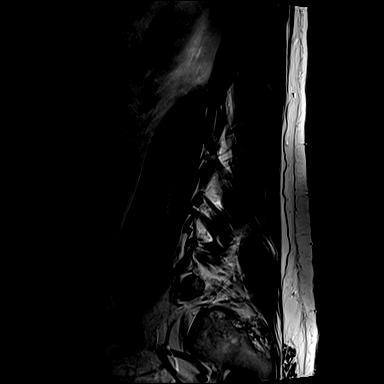

[Series 7: T1 · sagittal · 4.0mm · 0.73mm/px · 3 of 18 slices shown (1 of 2)]
[im 4/18]
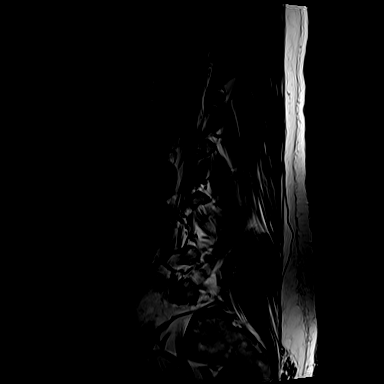
[im 11/18]
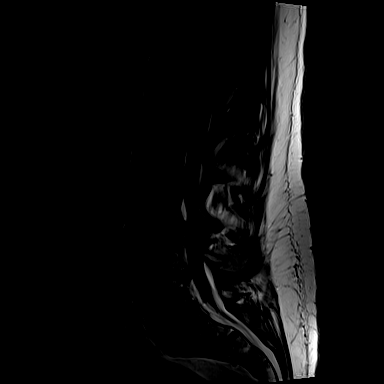
[im 18/18]
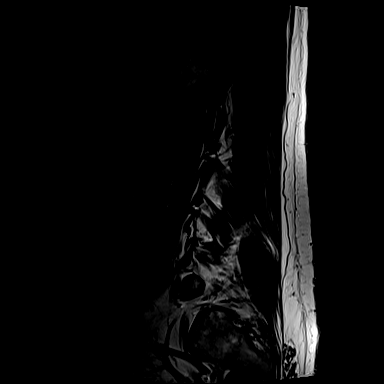

[Series 11: T1 · axial · 4.0mm · 0.28mm/px · z∈[-166,+6]mm · 3 of 46 slices shown (2 of 2)]
[im 7/46]
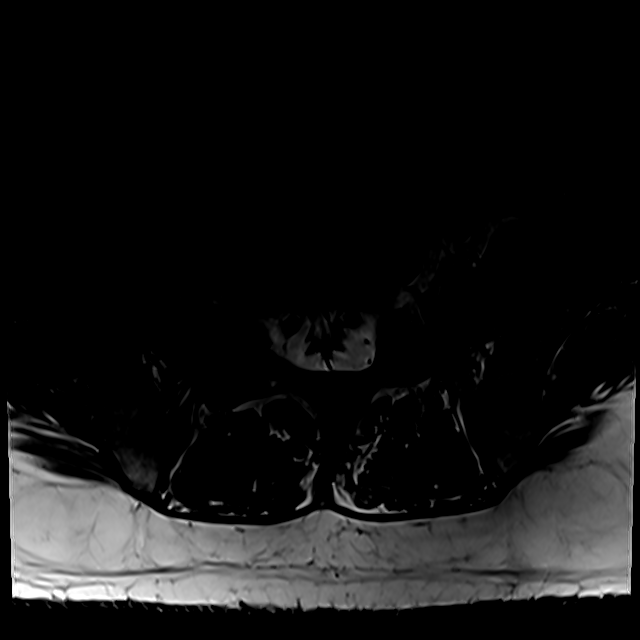
[im 23/46]
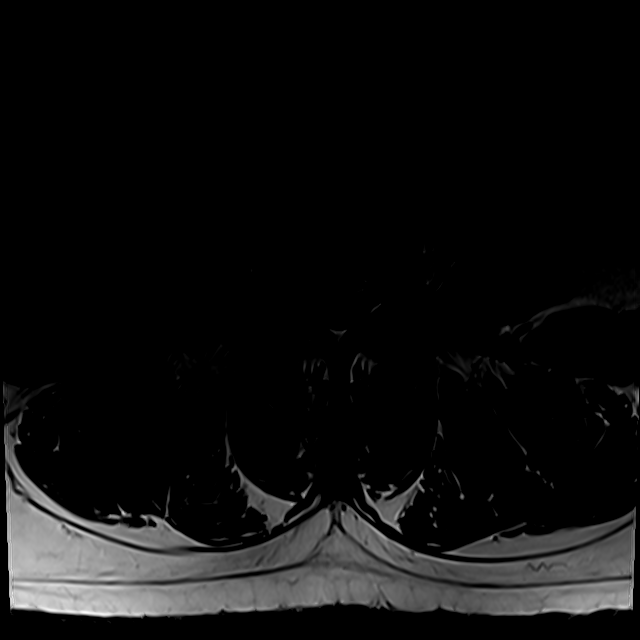
[im 39/46]
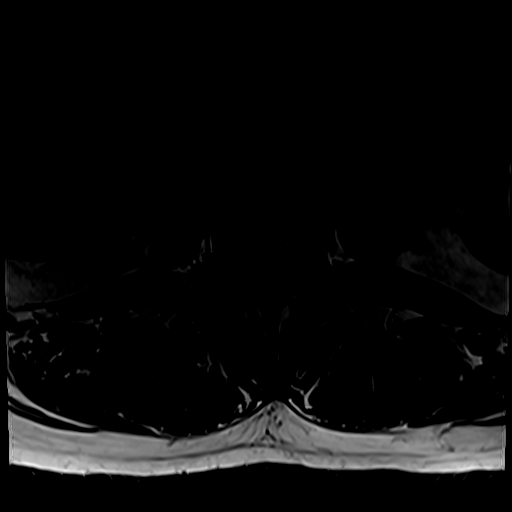

[Series 14: T2 · axial · 4.0mm · 0.28mm/px · z∈[-196,+6]mm · 7 of 46 slices shown (2 of 2)]
[im 1/46]
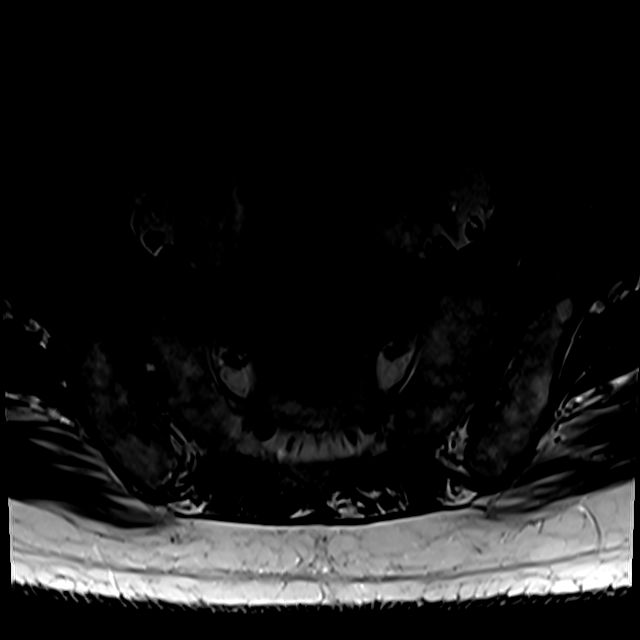
[im 7/46]
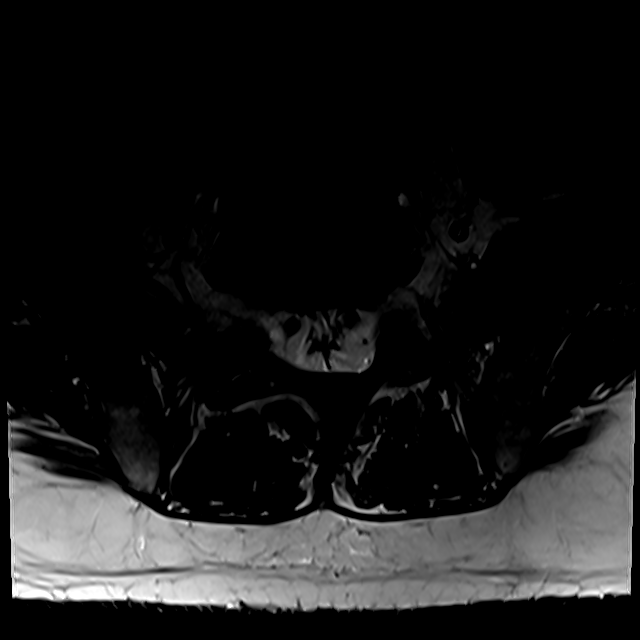
[im 13/46]
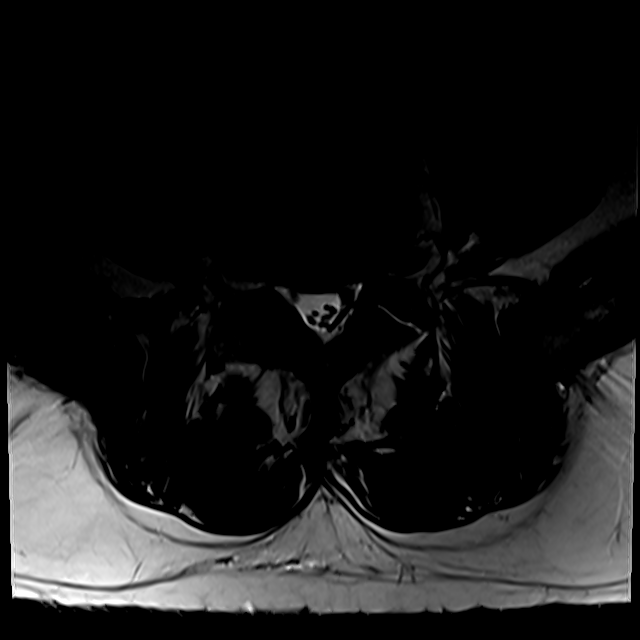
[im 20/46]
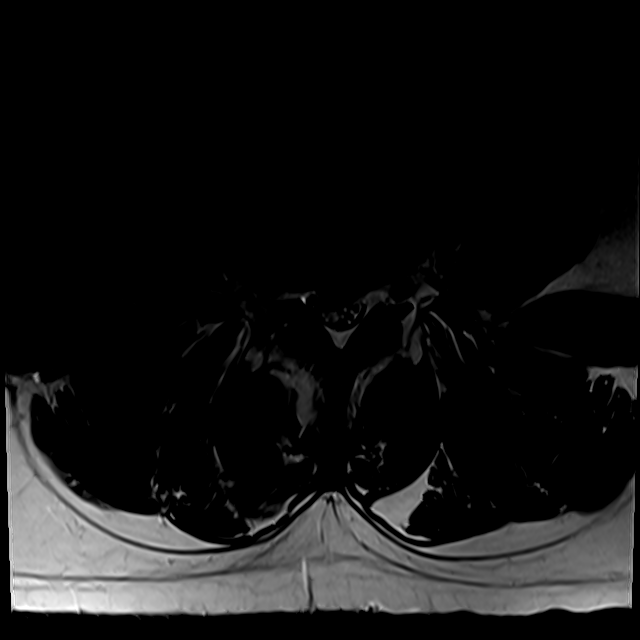
[im 23/46]
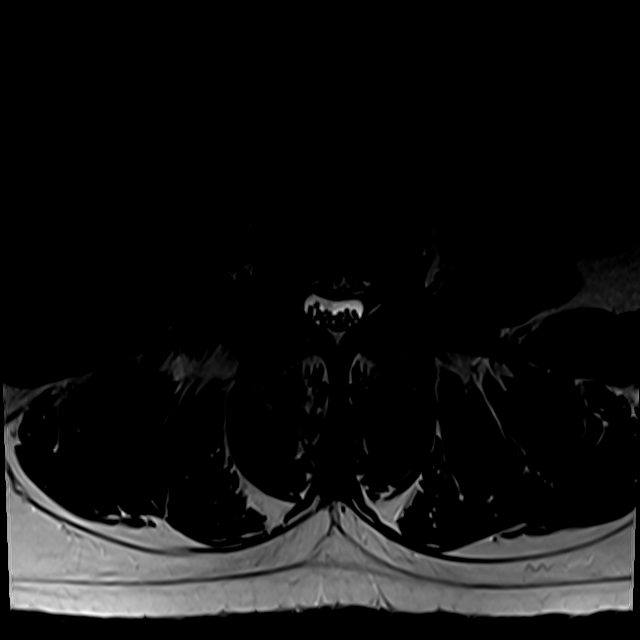
[im 26/46]
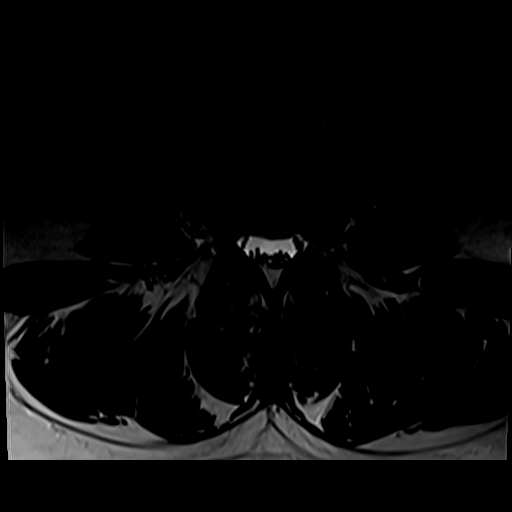
[im 39/46]
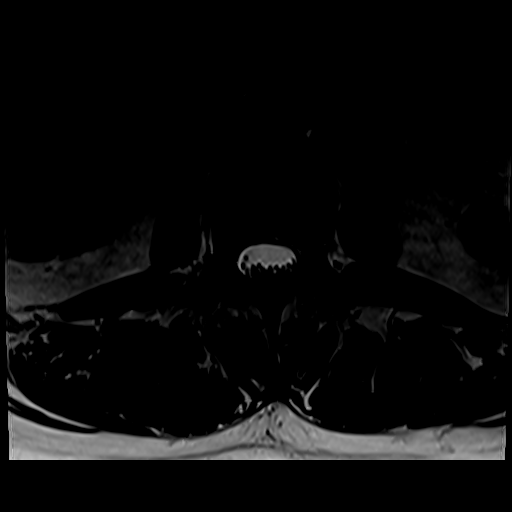

[19 of 48 positions shown; findings below may reference images not displayed]

FINDINGS: Segmentation: Sacralization of L5, with bilateral assimilation
joints.

Alignment:  Mild levocurvature.

Vertebrae: No acute fracture, evidence of discitis, or bone lesion.
Endplate degenerative changes at L3-L4 and L4-L5. bilateral L5 pars
defects

Conus medullaris and cauda equina: Conus extends to the T12-L1
level. Conus and cauda equina appear normal.

Paraspinal and other soft tissues: Negative.

Disc levels:

T12-L1: No significant disc bulge. No spinal canal stenosis or
neural foraminal narrowing.

L1-L2: No significant disc bulge. No spinal canal stenosis or neural
foraminal narrowing.

L2-L3: No significant disc bulge. Mild facet arthropathy. No spinal
canal stenosis or neural foraminal narrowing.

L3-L4: Disc height loss with broad-based disc bulge and superimposed
central disc extrusion with 6 mm of cranial migration. Mild facet
arthropathy and ligamentum flavum hypertrophy. Moderate to severe
spinal canal stenosis. Effacement of the bilateral lateral recesses.
No neural foraminal narrowing.

L4-L5: Disc height loss with disc bulge and superimposed central and
right paracentral protrusion. Mild facet arthropathy. No spinal
canal stenosis. Mild left neural foraminal narrowing.

L5-S1: Mild disc bulge. Moderate bilateral facet arthropathy. No
spinal canal stenosis. No significant neural foraminal narrowing.
IMPRESSION: L3-L4 moderate to severe spinal canal stenosis, in part secondary to
a central disc extrusion.

## 2021-01-17 ENCOUNTER — Other Ambulatory Visit: Payer: Self-pay

## 2021-01-17 ENCOUNTER — Ambulatory Visit (INDEPENDENT_AMBULATORY_CARE_PROVIDER_SITE_OTHER): Payer: Self-pay | Admitting: Physician Assistant

## 2021-01-17 ENCOUNTER — Encounter: Payer: Self-pay | Admitting: Physician Assistant

## 2021-01-17 DIAGNOSIS — F902 Attention-deficit hyperactivity disorder, combined type: Secondary | ICD-10-CM

## 2021-01-17 DIAGNOSIS — F5105 Insomnia due to other mental disorder: Secondary | ICD-10-CM

## 2021-01-17 DIAGNOSIS — F250 Schizoaffective disorder, bipolar type: Secondary | ICD-10-CM

## 2021-01-17 DIAGNOSIS — F99 Mental disorder, not otherwise specified: Secondary | ICD-10-CM

## 2021-01-17 DIAGNOSIS — G4733 Obstructive sleep apnea (adult) (pediatric): Secondary | ICD-10-CM

## 2021-01-17 DIAGNOSIS — F411 Generalized anxiety disorder: Secondary | ICD-10-CM

## 2021-01-17 MED ORDER — CLONAZEPAM 1 MG PO TABS: 1.0000 mg | ORAL_TABLET | Freq: Three times a day (TID) | ORAL | 1 refills | Status: DC | PRN

## 2021-01-17 MED ORDER — MIRTAZAPINE 15 MG PO TABS: 7.5000 mg | ORAL_TABLET | Freq: Every day | ORAL | 1 refills | Status: DC

## 2021-01-17 NOTE — Patient Instructions (Signed)
You can go ahead and start the mirtazapine 15 mg, 1/2 to 1 pill nightly as needed sleep at the same time you are weaning off trazodone. Wean off trazodone 100 mg by taking 2 pills every night for 3 nights then 1 pill every night for 3 nights and then stop it.
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
## 2021-01-20 ENCOUNTER — Telehealth: Payer: Self-pay | Admitting: Cardiology

## 2021-01-20 MED ORDER — ICOSAPENT ETHYL 1 G PO CAPS: 2.0000 g | ORAL_CAPSULE | Freq: Two times a day (BID) | ORAL | 6 refills | Status: DC

## 2021-01-20 NOTE — Telephone Encounter (Signed)
PT AWARE OF RECOMMENDATIONS FROM [facility] AWAITING [facility] RECOMMENDATIONS WILL CALL BACK ONCE GIVEN DIRECTIONS./CY

## 2021-01-20 NOTE — Telephone Encounter (Signed)
Do not recommend using Aleve on a regular basis - it will increase his risk of GI bleeding and he already takes Brilinta, also not recommended given his history of CVD. Would prefer he use Tylenol if needed for pain.  Fish oil is fine to take, but would actually recommend that he take prescription Vascepa instead of OTC fish oil since it would provide him with cardiac benefit and triglyceride lowering (previously over 2,000). If he is agreeable, would send in Vascepa 2g BID.

## 2021-01-20 NOTE — Telephone Encounter (Signed)
Called pt and advised him that I would send his question about medication reactions to our pharmacist.  I also advised him that if he can reach out to the pharmacist at his local pharmacy; if he doesn't get a reply today as Monday is a holiday.  Pt reports that he has been diagnosed with OSA and needs to reestablish care with a sleep specialist.  Will route to Tereso Newcomer, PA to address.

## 2021-01-20 NOTE — Telephone Encounter (Signed)
Pt wants to know if any of his meds will interact with Aleve and Fish oil?  Pt would like a referral to a sleep specialist

## 2021-01-24 NOTE — Telephone Encounter (Signed)
I agree with M. Supple's, PharmD recommendations. Tereso Newcomer, PA-C    01/24/2021 5:04 PM

## 2021-01-25 ENCOUNTER — Other Ambulatory Visit: Payer: Self-pay | Admitting: *Deleted

## 2021-01-31 NOTE — Telephone Encounter (Signed)
Called pt to let him know that Matthew [name] is in agreement with PharmD recommendations.  The Vascepa is still in a "pending" state according to his pharmacy insurance has not approved it yet.   It may be that a PA is needed.  Adv pt we would look into from office end so that he can get started on this.

## 2021-02-01 NOTE — Telephone Encounter (Signed)
If additional info is needed for request - he takes atorvastatin, and gemfibrozil is contraindicated to use with statin therapy so that is not an option. Fenofibrate is not preferred because there is a lack of cardiovascular benefit. Vascepa is approved for patients with TG > 500 (his were 2311 earlier this year), as well as for cardiovascular risk reduction in patients with CAD (he has hx of MI), and is the preferred agent for him due to dual benefit with both triglyceride lowering and cardiovascular risk reduction.

## 2021-02-01 NOTE — Telephone Encounter (Signed)
**Note De-Identified  Obfuscation** I called [employer] and did this Vascepa PA over the phone with Patty. PA Review #: [id number]  Per Patty we can call [employer] [employer] back in 24 hours at [phone] for their determination or have the pts pharmacy re-run his Vascepa RX to determine coverage approval or denial after 24 hours.  Per Patty the preferred medications on the pts plan of covered drugs are Fenofibrate and/or Gemfibrozil (per chart the pt has not tried either).

## 2021-02-02 ENCOUNTER — Telehealth: Payer: Self-pay | Admitting: Physician Assistant

## 2021-02-02 DIAGNOSIS — Z79899 Other long term (current) drug therapy: Secondary | ICD-10-CM

## 2021-02-02 DIAGNOSIS — F250 Schizoaffective disorder, bipolar type: Secondary | ICD-10-CM

## 2021-02-02 NOTE — Telephone Encounter (Signed)
Please let him know that I did receive the lab results from [facility] that were drawn a month or so ago.  I do not see a lithium level or thyroid check which needs to be drawn.  I will order those test and he should have them done within the next month, at [facility].  He does not have to be fasting but he should have labs drawn in the morning.  Please mail him the order.  Thank you.

## 2021-02-03 ENCOUNTER — Other Ambulatory Visit: Payer: Self-pay | Admitting: Physician Assistant

## 2021-02-06 NOTE — Telephone Encounter (Signed)
**Note De-Identified [name] Obfuscation** I called [facility] and was advised that both Icosapent and Vascepa are still rejecting and that a PA is required foe both.  I called [employer] and s/w Eta who confirmed that this Vascepa PA was denied for not meeting the requirements and advised me that I would need to appeal this decision Matthew [name] appeals form from their web site.  I printed the appeal form, completed it, attached OV notes and emailed all to Matthew [name] nurse so she can obtain Matthew [name] (DOD) signature as Matthew [name] is currently out of the office and to fax all to [employer] at the fax number written on the cover letter or to place in the to be faxed basket in Medical Records to be faxed.

## 2021-02-06 NOTE — Telephone Encounter (Signed)
Form signed by the DOD Dr. Anne Fu, in absence of Dr. Shari Prows. Completed forms placed in nurse fax box in medical records, to be faxed to the information noted on cover sheet.

## 2021-02-06 NOTE — Telephone Encounter (Deleted)
**Note De-Identified  Obfuscation** I called [facility] and was advised that both name brand Vascepa and Icosapent are still rejecting and need a PA is required. I called [facility] and s/w

## 2021-02-06 NOTE — Telephone Encounter (Signed)
Pt informed.Please mail the lab order to him

## 2021-02-08 ENCOUNTER — Other Ambulatory Visit: Payer: Self-pay | Admitting: Physician Assistant

## 2021-02-24 ENCOUNTER — Observation Stay (HOSPITAL_COMMUNITY)
Admission: EM | Admit: 2021-02-24 | Discharge: 2021-02-25 | Disposition: A | Discharge status: Home / Self Care | Payer: Medicaid Other | Attending: Cardiology | Admitting: Cardiology

## 2021-02-24 ENCOUNTER — Ambulatory Visit (HOSPITAL_COMMUNITY)
Admission: EM | Disposition: A | Discharge status: Home / Self Care | Payer: Self-pay | Source: Home / Self Care | Attending: Emergency Medicine

## 2021-02-24 ENCOUNTER — Emergency Department (HOSPITAL_COMMUNITY): Payer: Medicaid Other

## 2021-02-24 ENCOUNTER — Other Ambulatory Visit: Payer: Self-pay

## 2021-02-24 ENCOUNTER — Encounter (HOSPITAL_COMMUNITY): Payer: Self-pay

## 2021-02-24 DIAGNOSIS — Z794 Long term (current) use of insulin: Secondary | ICD-10-CM | POA: Insufficient documentation

## 2021-02-24 DIAGNOSIS — Z7982 Long term (current) use of aspirin: Secondary | ICD-10-CM | POA: Diagnosis not present

## 2021-02-24 DIAGNOSIS — Z20822 Contact with and (suspected) exposure to covid-19: Secondary | ICD-10-CM | POA: Diagnosis not present

## 2021-02-24 DIAGNOSIS — R079 Chest pain, unspecified: Secondary | ICD-10-CM

## 2021-02-24 DIAGNOSIS — Z79899 Other long term (current) drug therapy: Secondary | ICD-10-CM | POA: Insufficient documentation

## 2021-02-24 DIAGNOSIS — E1165 Type 2 diabetes mellitus with hyperglycemia: Secondary | ICD-10-CM | POA: Insufficient documentation

## 2021-02-24 DIAGNOSIS — I200 Unstable angina: Principal | ICD-10-CM

## 2021-02-24 DIAGNOSIS — F250 Schizoaffective disorder, bipolar type: Secondary | ICD-10-CM | POA: Insufficient documentation

## 2021-02-24 DIAGNOSIS — F17210 Nicotine dependence, cigarettes, uncomplicated: Secondary | ICD-10-CM | POA: Diagnosis not present

## 2021-02-24 DIAGNOSIS — I10 Essential (primary) hypertension: Secondary | ICD-10-CM | POA: Diagnosis not present

## 2021-02-24 DIAGNOSIS — I25110 Atherosclerotic heart disease of native coronary artery with unstable angina pectoris: Secondary | ICD-10-CM | POA: Diagnosis not present

## 2021-02-24 HISTORY — PX: LEFT HEART CATH AND CORONARY ANGIOGRAPHY: CATH118249

## 2021-02-24 LAB — COMPREHENSIVE METABOLIC PANEL
ALT: 84 U/L — ABNORMAL HIGH (ref 0–44)
AST: 55 U/L — ABNORMAL HIGH (ref 15–41)
Albumin: 4.1 g/dL (ref 3.5–5.0)
Alkaline Phosphatase: 93 U/L (ref 38–126)
Anion gap: 13 (ref 5–15)
BUN: 12 mg/dL (ref 6–20)
CO2: 21 mmol/L — ABNORMAL LOW (ref 22–32)
Calcium: 9.7 mg/dL (ref 8.9–10.3)
Chloride: 98 mmol/L (ref 98–111)
Creatinine, Ser: 1 mg/dL (ref 0.61–1.24)
GFR, Estimated: >60 mL/min (ref >60)
Glucose, Bld: 149 mg/dL — ABNORMAL HIGH (ref 70–99)
Potassium: 3.9 mmol/L (ref 3.5–5.1)
Sodium: 132 mmol/L — ABNORMAL LOW (ref 135–145)
Total Bilirubin: 0.6 mg/dL (ref 0.3–1.2)
Total Protein: 7.1 g/dL (ref 6.5–8.1)

## 2021-02-24 LAB — RESP PANEL BY RT-PCR (FLU A&B, COVID) ARPGX2
Influenza A by PCR: NEGATIVE
Influenza B by PCR: NEGATIVE
SARS Coronavirus 2 by RT PCR: NEGATIVE

## 2021-02-24 LAB — CBC WITH DIFFERENTIAL/PLATELET
Abs Immature Granulocytes: 0.07 10*3/uL (ref 0.00–0.07)
Basophils Absolute: 0.1 10*3/uL (ref 0.0–0.1)
Basophils Relative: 1 %
Eosinophils Absolute: 0.6 10*3/uL — ABNORMAL HIGH (ref 0.0–0.5)
Eosinophils Relative: 5 %
HCT: 48.3 % (ref 39.0–52.0)
Hemoglobin: 16.5 g/dL (ref 13.0–17.0)
Immature Granulocytes: 1 %
Lymphocytes Relative: 23 %
Lymphs Abs: 3.1 10*3/uL (ref 0.7–4.0)
MCH: 28.6 pg (ref 26.0–34.0)
MCHC: 34.2 g/dL (ref 30.0–36.0)
MCV: 83.9 fL (ref 80.0–100.0)
Monocytes Absolute: 0.9 10*3/uL (ref 0.1–1.0)
Monocytes Relative: 6 %
Neutro Abs: 8.8 10*3/uL — ABNORMAL HIGH (ref 1.7–7.7)
Neutrophils Relative %: 64 %
Platelets: 328 10*3/uL (ref 150–400)
RBC: 5.76 MIL/uL (ref 4.22–5.81)
RDW: 14.1 % (ref 11.5–15.5)
WBC: 13.6 10*3/uL — ABNORMAL HIGH (ref 4.0–10.5)
nRBC: 0 % (ref 0.0–0.2)

## 2021-02-24 LAB — GLUCOSE, CAPILLARY: Glucose-Capillary: 184 mg/dL — ABNORMAL HIGH (ref 70–99)

## 2021-02-24 LAB — TROPONIN I (HIGH SENSITIVITY)
Troponin I (High Sensitivity): 3 ng/L (ref <18)
Troponin I (High Sensitivity): 3 ng/L (ref <18)

## 2021-02-24 LAB — D-DIMER, QUANTITATIVE: D-Dimer, Quant: <0.27 ug/mL-FEU (ref 0.00–0.50)

## 2021-02-24 IMAGING — DX DG CHEST 1V PORT
1 series · 1 of 1 positions shown · non-contrast
Comparison: [DATE]

CLINICAL DATA: Chest pain

EXAM:
PORTABLE CHEST 1 VIEW

[chest ap]
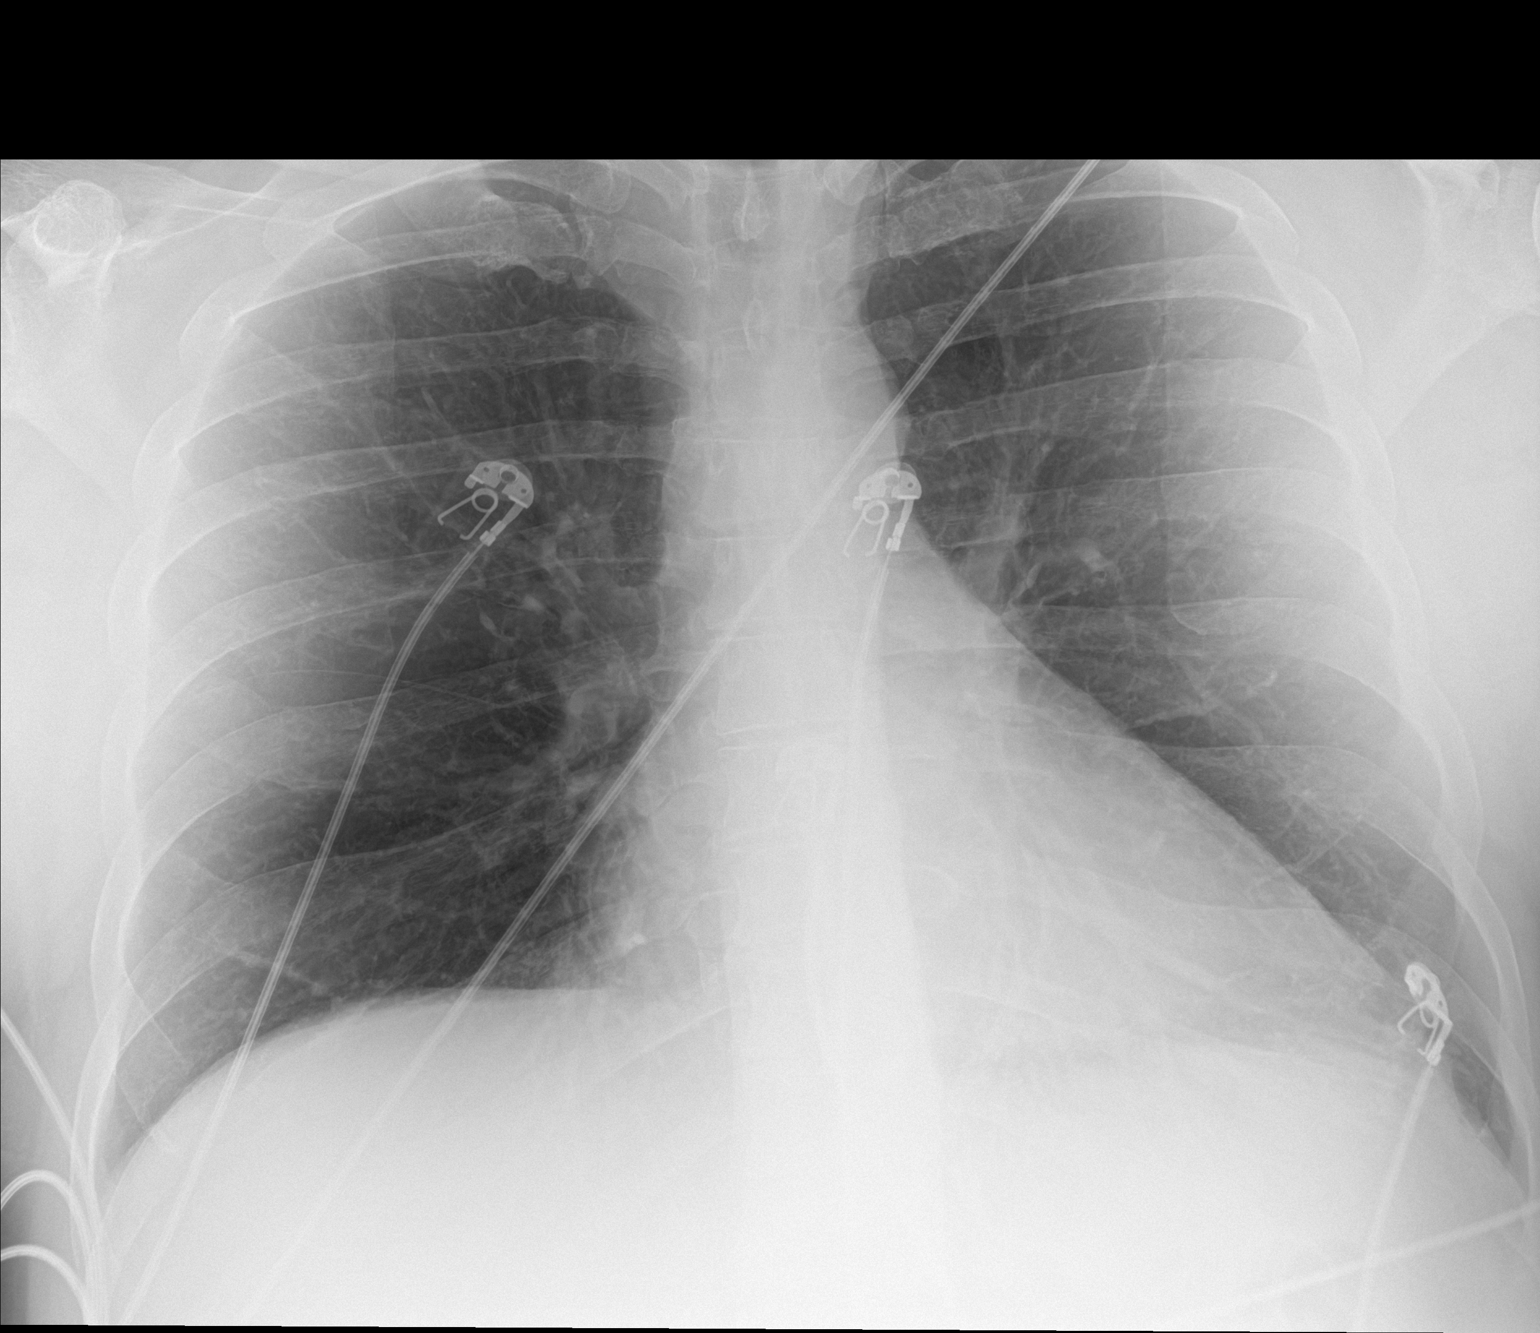

[1 of 1 positions shown; findings below may reference images not displayed]

FINDINGS: The heart size and mediastinal contours are within normal limits.
Both lungs are clear. The visualized skeletal structures are
unremarkable.
IMPRESSION: No active disease.

## 2021-02-24 SURGERY — LEFT HEART CATH AND CORONARY ANGIOGRAPHY
Anesthesia: LOCAL

## 2021-02-24 MED ORDER — ATORVASTATIN CALCIUM 40 MG PO TABS
80.0000 mg | ORAL_TABLET | Freq: Every day | ORAL | Status: DC
  Administered 2021-02-24 – 2021-02-25 (×2): 80 mg via ORAL
  Filled 2021-02-24 (×2): qty 2

## 2021-02-24 MED ORDER — HYDRALAZINE HCL 20 MG/ML IJ SOLN: 10.0000 mg | INTRAMUSCULAR | Status: AC | PRN

## 2021-02-24 MED ORDER — MIDAZOLAM HCL 2 MG/2ML IJ SOLN
INTRAMUSCULAR | Status: DC | PRN
  Administered 2021-02-24: 1 mg via INTRAVENOUS

## 2021-02-24 MED ORDER — HEPARIN SODIUM (PORCINE) 1000 UNIT/ML IJ SOLN
INTRAMUSCULAR | Status: AC
  Filled 2021-02-24: qty 1

## 2021-02-24 MED ORDER — SODIUM CHLORIDE 0.9 % IV SOLN: INTRAVENOUS | Status: AC

## 2021-02-24 MED ORDER — OXYCODONE HCL 5 MG PO TABS
5.0000 mg | ORAL_TABLET | Freq: Three times a day (TID) | ORAL | Status: DC
Start: 2021-02-24 — End: 2021-02-25
  Administered 2021-02-24 – 2021-02-25 (×2): 5 mg via ORAL
  Filled 2021-02-24 (×2): qty 1

## 2021-02-24 MED ORDER — LISINOPRIL 20 MG PO TABS
20.0000 mg | ORAL_TABLET | Freq: Every day | ORAL | Status: DC
  Administered 2021-02-25: 20 mg via ORAL
  Filled 2021-02-24: qty 1
  Filled 2021-02-24: qty 2

## 2021-02-24 MED ORDER — HEPARIN SODIUM (PORCINE) 5000 UNIT/ML IJ SOLN: 5000.0000 [IU] | Freq: Three times a day (TID) | INTRAMUSCULAR | Status: DC

## 2021-02-24 MED ORDER — HEPARIN BOLUS VIA INFUSION: 4000.0000 [IU] | Freq: Once | INTRAVENOUS | Status: DC

## 2021-02-24 MED ORDER — HEPARIN SODIUM (PORCINE) 1000 UNIT/ML IJ SOLN
INTRAMUSCULAR | Status: DC | PRN
  Administered 2021-02-24: 5500 [IU] via INTRAVENOUS

## 2021-02-24 MED ORDER — INSULIN GLARGINE-YFGN 100 UNIT/ML ~~LOC~~ SOLN
20.0000 [IU] | Freq: Every day | SUBCUTANEOUS | Status: DC
  Administered 2021-02-25: 20 [IU] via SUBCUTANEOUS
  Filled 2021-02-24: qty 0.2

## 2021-02-24 MED ORDER — NITROGLYCERIN 0.4 MG SL SUBL: 0.4000 mg | SUBLINGUAL_TABLET | SUBLINGUAL | Status: DC | PRN

## 2021-02-24 MED ORDER — TICAGRELOR 90 MG PO TABS
90.0000 mg | ORAL_TABLET | Freq: Two times a day (BID) | ORAL | Status: DC
  Administered 2021-02-24 – 2021-02-25 (×2): 90 mg via ORAL
  Filled 2021-02-24 (×2): qty 1

## 2021-02-24 MED ORDER — ISOSORBIDE MONONITRATE ER 30 MG PO TB24
30.0000 mg | ORAL_TABLET | Freq: Every day | ORAL | Status: DC
  Administered 2021-02-24 – 2021-02-25 (×2): 30 mg via ORAL
  Filled 2021-02-24 (×2): qty 1

## 2021-02-24 MED ORDER — VERAPAMIL HCL 2.5 MG/ML IV SOLN
INTRAVENOUS | Status: DC | PRN
  Administered 2021-02-24: 10 mL via INTRA_ARTERIAL

## 2021-02-24 MED ORDER — INSULIN DEGLUDEC 100 UNIT/ML ~~LOC~~ SOPN: 20.0000 [IU] | PEN_INJECTOR | Freq: Every day | SUBCUTANEOUS | Status: DC

## 2021-02-24 MED ORDER — CARVEDILOL 12.5 MG PO TABS
25.0000 mg | ORAL_TABLET | Freq: Two times a day (BID) | ORAL | Status: DC
  Administered 2021-02-24 – 2021-02-25 (×2): 25 mg via ORAL
  Filled 2021-02-24 (×2): qty 2

## 2021-02-24 MED ORDER — DULOXETINE HCL 60 MG PO CPEP
60.0000 mg | ORAL_CAPSULE | Freq: Two times a day (BID) | ORAL | Status: DC
  Administered 2021-02-24 – 2021-02-25 (×2): 60 mg via ORAL
  Filled 2021-02-24 (×2): qty 1

## 2021-02-24 MED ORDER — NICOTINE 21 MG/24HR TD PT24
21.0000 mg | MEDICATED_PATCH | Freq: Every day | TRANSDERMAL | Status: DC
  Administered 2021-02-24 – 2021-02-25 (×2): 21 mg via TRANSDERMAL
  Filled 2021-02-24 (×2): qty 1

## 2021-02-24 MED ORDER — ONDANSETRON HCL 4 MG/2ML IJ SOLN
4.0000 mg | Freq: Once | INTRAMUSCULAR | Status: AC
  Administered 2021-02-24: 4 mg via INTRAVENOUS
  Filled 2021-02-24: qty 2

## 2021-02-24 MED ORDER — HEPARIN (PORCINE) IN NACL 1000-0.9 UT/500ML-% IV SOLN
INTRAVENOUS | Status: AC
  Filled 2021-02-24: qty 1000

## 2021-02-24 MED ORDER — MORPHINE SULFATE (PF) 4 MG/ML IV SOLN
4.0000 mg | Freq: Once | INTRAVENOUS | Status: AC
  Administered 2021-02-24: 4 mg via INTRAVENOUS
  Filled 2021-02-24: qty 1

## 2021-02-24 MED ORDER — ACETAMINOPHEN 325 MG PO TABS: 650.0000 mg | ORAL_TABLET | ORAL | Status: DC | PRN

## 2021-02-24 MED ORDER — LABETALOL HCL 5 MG/ML IV SOLN: 10.0000 mg | INTRAVENOUS | Status: AC | PRN

## 2021-02-24 MED ORDER — LIDOCAINE HCL (PF) 1 % IJ SOLN
INTRAMUSCULAR | Status: DC | PRN
  Administered 2021-02-24: 2 mL

## 2021-02-24 MED ORDER — OXYCODONE-ACETAMINOPHEN 5-325 MG PO TABS
1.0000 | ORAL_TABLET | Freq: Three times a day (TID) | ORAL | Status: DC
  Administered 2021-02-24 – 2021-02-25 (×2): 1 via ORAL
  Filled 2021-02-24 (×2): qty 1

## 2021-02-24 MED ORDER — SODIUM CHLORIDE 0.9 % IV SOLN: 250.0000 mL | INTRAVENOUS | Status: DC | PRN

## 2021-02-24 MED ORDER — SODIUM CHLORIDE 0.9% FLUSH
3.0000 mL | Freq: Two times a day (BID) | INTRAVENOUS | Status: DC
  Administered 2021-02-25: 3 mL via INTRAVENOUS

## 2021-02-24 MED ORDER — MIDAZOLAM HCL 2 MG/2ML IJ SOLN
INTRAMUSCULAR | Status: AC
  Filled 2021-02-24: qty 2

## 2021-02-24 MED ORDER — OXYCODONE-ACETAMINOPHEN 10-325 MG PO TABS: 1.0000 | ORAL_TABLET | Freq: Three times a day (TID) | ORAL | Status: DC

## 2021-02-24 MED ORDER — MIRTAZAPINE 7.5 MG PO TABS: 7.5000 mg | ORAL_TABLET | Freq: Every day | ORAL | Status: DC

## 2021-02-24 MED ORDER — TICAGRELOR 90 MG PO TABS: 90.0000 mg | ORAL_TABLET | Freq: Two times a day (BID) | ORAL | Status: DC

## 2021-02-24 MED ORDER — PERPHENAZINE 4 MG PO TABS
32.0000 mg | ORAL_TABLET | Freq: Every day | ORAL | Status: DC
  Administered 2021-02-24: 32 mg via ORAL
  Filled 2021-02-24: qty 2
  Filled 2021-02-24: qty 8

## 2021-02-24 MED ORDER — NITROGLYCERIN 0.4 MG SL SUBL
0.4000 mg | SUBLINGUAL_TABLET | SUBLINGUAL | Status: DC | PRN
  Administered 2021-02-24: 0.4 mg via SUBLINGUAL
  Filled 2021-02-24: qty 1

## 2021-02-24 MED ORDER — EMPAGLIFLOZIN 25 MG PO TABS
25.0000 mg | ORAL_TABLET | Freq: Every day | ORAL | Status: DC
  Administered 2021-02-25: 25 mg via ORAL
  Filled 2021-02-24: qty 1

## 2021-02-24 MED ORDER — CLONAZEPAM 0.25 MG PO TBDP
1.0000 mg | ORAL_TABLET | Freq: Once | ORAL | Status: AC
  Administered 2021-02-24: 1 mg via ORAL
  Filled 2021-02-24: qty 4

## 2021-02-24 MED ORDER — ASPIRIN 81 MG PO CHEW: 81.0000 mg | CHEWABLE_TABLET | Freq: Every day | ORAL | Status: DC

## 2021-02-24 MED ORDER — ASPIRIN EC 81 MG PO TBEC
81.0000 mg | DELAYED_RELEASE_TABLET | Freq: Every day | ORAL | Status: DC
  Administered 2021-02-25: 81 mg via ORAL
  Filled 2021-02-24: qty 1

## 2021-02-24 MED ORDER — PERPHENAZINE 16 MG PO TABS: 16.0000 mg | ORAL_TABLET | Freq: Every day | ORAL | Status: DC

## 2021-02-24 MED ORDER — ONDANSETRON HCL 4 MG/2ML IJ SOLN: 4.0000 mg | Freq: Four times a day (QID) | INTRAMUSCULAR | Status: DC | PRN

## 2021-02-24 MED ORDER — VERAPAMIL HCL 2.5 MG/ML IV SOLN
INTRAVENOUS | Status: AC
  Filled 2021-02-24: qty 2

## 2021-02-24 MED ORDER — LITHIUM CARBONATE ER 300 MG PO TBCR
600.0000 mg | EXTENDED_RELEASE_TABLET | Freq: Every day | ORAL | Status: DC
  Administered 2021-02-24: 600 mg via ORAL
  Filled 2021-02-24: qty 2

## 2021-02-24 MED ORDER — FENTANYL CITRATE (PF) 100 MCG/2ML IJ SOLN
INTRAMUSCULAR | Status: DC | PRN
  Administered 2021-02-24: 50 ug via INTRAVENOUS

## 2021-02-24 MED ORDER — SPIRONOLACTONE 12.5 MG HALF TABLET
12.5000 mg | ORAL_TABLET | Freq: Every day | ORAL | Status: DC
  Administered 2021-02-24 – 2021-02-25 (×2): 12.5 mg via ORAL
  Filled 2021-02-24 (×2): qty 1

## 2021-02-24 MED ORDER — GABAPENTIN 300 MG PO CAPS
800.0000 mg | ORAL_CAPSULE | Freq: Two times a day (BID) | ORAL | Status: DC
  Administered 2021-02-24 – 2021-02-25 (×2): 800 mg via ORAL
  Filled 2021-02-24 (×2): qty 2

## 2021-02-24 MED ORDER — SODIUM CHLORIDE 0.9% FLUSH: 3.0000 mL | INTRAVENOUS | Status: DC | PRN

## 2021-02-24 MED ORDER — FENTANYL CITRATE (PF) 100 MCG/2ML IJ SOLN
INTRAMUSCULAR | Status: AC
  Filled 2021-02-24: qty 2

## 2021-02-24 MED ORDER — SODIUM CHLORIDE 0.9 % IV BOLUS
1000.0000 mL | Freq: Once | INTRAVENOUS | Status: AC
  Administered 2021-02-24: 1000 mL via INTRAVENOUS

## 2021-02-24 MED ORDER — LIDOCAINE HCL (PF) 1 % IJ SOLN
INTRAMUSCULAR | Status: AC
  Filled 2021-02-24: qty 30

## 2021-02-24 MED ORDER — CLONAZEPAM 0.5 MG PO TABS
1.0000 mg | ORAL_TABLET | Freq: Three times a day (TID) | ORAL | Status: DC | PRN
  Administered 2021-02-24 – 2021-02-25 (×2): 1 mg via ORAL
  Filled 2021-02-24 (×2): qty 2

## 2021-02-24 MED ORDER — ACETAMINOPHEN 325 MG PO TABS
650.0000 mg | ORAL_TABLET | ORAL | Status: DC | PRN
Start: 2021-02-24 — End: 2021-02-24

## 2021-02-24 MED ORDER — IOHEXOL 350 MG/ML SOLN
INTRAVENOUS | Status: DC | PRN
  Administered 2021-02-24: 75 mL

## 2021-02-24 SURGICAL SUPPLY — 9 items
CATH 5FR JL3.5 JR4 ANG PIG MP (CATHETERS) ×2 IMPLANT
DEVICE RAD COMP TR BAND LRG (VASCULAR PRODUCTS) ×2 IMPLANT
GLIDESHEATH SLEND A-KIT 6F 22G (SHEATH) ×2 IMPLANT
GUIDEWIRE INQWIRE 1.5J.035X260 (WIRE) ×1 IMPLANT
INQWIRE 1.5J .035X260CM (WIRE) ×2
KIT HEART LEFT (KITS) ×2 IMPLANT
PACK CARDIAC CATHETERIZATION (CUSTOM PROCEDURE TRAY) ×2 IMPLANT
TRANSDUCER W/STOPCOCK (MISCELLANEOUS) ×2 IMPLANT
TUBING CIL FLEX 10 FLL-RA (TUBING) ×2 IMPLANT

## 2021-02-24 NOTE — ED Provider Notes (Signed)
[facility] EMERGENCY DEPARTMENT Provider Note   CSN: [id number] Arrival date & time: 02/24/21  1250     History Chief Complaint  Patient presents with   Chest Pain    Matthew [name] is a 45 y.o. male.  HPI     45 year old male with a history of diabetes, hypertension, schizoaffective disorder, coronary artery disease with history of NSTEMI treated with DES to the LAD and RCA in January 2022, history of heart failure with improved ejection fraction, hyperlipidemia, celiac disease who presents with concern for chest pain.   Pressure central chest iwht radiation down right tarm started and midnight last night Worse with walking and deep breaths Feels similar to past heart attack (not similar to other episodes of CP he had following MI) Shortness of breath Nausea and vomiting began before chest pain--vomitng 3-4 times /day, tried tums and pepto without help No fever Cough, hx of msoking, no change Lightheadedness, diaphoresis No leg pain or swelling Took one tablet of aspirin 81mg  at 6AM and full size this afternoon Diarrhea 2-3 days, 6-8 times per day No sick contacts No etoh or other drugs  Vaping starting 2-3 days ago, using it pretty often, started feeling sick before   Past Medical History:  Diagnosis Date   Anxiety    Celiac disease    Coronary artery disease    Dairy allergy    Depression    Diabetes mellitus without complication (HCC)    High cholesterol    Hypertension    Paranoia (HCC)    Pre-diabetes    Schizoaffective disorder (HCC)     Patient Active Problem List   Diagnosis Date Noted   Unstable angina (HCC) 02/24/2021   Right-sided chest pain 09/11/2020   Coronary artery disease of bypass graft of native heart with stable angina pectoris (HCC)    Uncontrolled type 2 diabetes mellitus with hyperglycemia (HCC)    Hyponatremia    Essential hypertension    History of hyperprolactinemia 08/16/2020   Mixed dyslipidemia 08/15/2020    Chest pain 06/06/2020   Type 2 diabetes mellitus with hyperglycemia (HCC) 06/06/2020   Hyperkalemia 06/06/2020   Thrombocytopenia (HCC) 06/06/2020   Obesity (BMI 30.0-34.9) 06/06/2020   Non-ST elevation (NSTEMI) myocardial infarction [facility])    Attention deficit hyperactivity disorder (ADHD), combined type, moderate 03/10/2018   Generalized anxiety disorder 03/10/2018   Schizoaffective disorder, bipolar type without good prognostic features (HCC) 02/09/2015   Morbid obesity (HCC) 01/18/2015   OSA (obstructive sleep apnea) 07/14/2014   Solitary pulmonary nodule 07/14/2014    Past Surgical History:  Procedure Laterality Date   APPENDECTOMY     BRAIN SURGERY     CARDIAC CATHETERIZATION     CORONARY STENT INTERVENTION N/A 06/06/2020   Procedure: CORONARY STENT INTERVENTION;  Surgeon: 06/08/2020, Peter M, MD;  Location: [facility] INVASIVE CV LAB;  Service: Cardiovascular;  Laterality: N/A;  prox LAD, distal RCA   INTRAVASCULAR ULTRASOUND/IVUS N/A 06/06/2020   Procedure: Intravascular Ultrasound/IVUS;  Surgeon: 06/08/2020, Peter M, MD;  Location: [facility] INVASIVE CV LAB;  Service: Cardiovascular;  Laterality: N/A;   LEFT HEART CATH AND CORONARY ANGIOGRAPHY N/A 06/06/2020   Procedure: LEFT HEART CATH AND CORONARY ANGIOGRAPHY;  Surgeon: 06/08/2020, Peter M, MD;  Location: [facility] INVASIVE CV LAB;  Service: Cardiovascular;  Laterality: N/A;   NASAL SEPTUM SURGERY     WRIST FRACTURE SURGERY     right wrist       Family History  Problem Relation Age of Onset   Asthma Mother  Hypertension Mother    Hypertension Father     Social History   Tobacco Use   Smoking status: Heavy Smoker    Packs/day: 1.00    Types: Cigarettes   Smokeless tobacco: Never   Tobacco comments:    patient given phone number to 1-800-quit now on 08/16/20  Vaping Use   Vaping Use: Never used  Substance Use Topics   Alcohol use: No    Alcohol/week: 0.0 standard drinks    Comment: no (02/08/15)   Drug use: No    Home  Medications Prior to Admission medications   Medication Sig Start Date End Date Taking? Authorizing Provider  aspirin EC 81 MG tablet Take 1 tablet (81 mg total) by mouth daily. Swallow whole. 06/07/20  Yes Meriam Sprague, MD  atorvastatin (LIPITOR) 40 MG tablet Take 40 mg by mouth every morning.   Yes [provider]  carvedilol (COREG) 25 MG tablet Take 1 tablet (25 mg total) by mouth 2 (two) times daily. 10/10/20  Yes Meriam Sprague, MD  clonazePAM (KLONOPIN) 1 MG tablet Take 1 tablet (1 mg total) by mouth 3 (three) times daily as needed for anxiety. Patient taking differently: Take 1 mg by mouth 3 (three) times daily. 02/04/21  Yes Hurst, Glade Nurse, PA-C  DULoxetine (CYMBALTA) 60 MG capsule Take 1 capsule (60 mg total) by mouth 2 (two) times daily. 12/08/20  Yes Hurst, Teresa T, PA-C  empagliflozin (JARDIANCE) 25 MG TABS tablet Take 1 tablet (25 mg total) by mouth daily before breakfast. 10/10/20  Yes Pemberton, Kathlynn Grate, MD  gabapentin (NEURONTIN) 800 MG tablet 1 po q am, 1/2 po at noon, 1 po at 4 pm, and 1 po at bedtime. Patient taking differently: Take 400-800 mg by mouth See admin instructions. Take 1 tablet (800 mg) by mouth every morning, take 1/2 tablet (400 mg) at noon, take 1 tablet (800 mg) at 4pm and at bedtime 12/07/20  Yes Hurst, Teresa T, PA-C  insulin degludec (TRESIBA FLEXTOUCH) 100 UNIT/ML FlexTouch Pen Inject 20 Units into the skin daily. Patient taking differently: Inject 20 Units into the skin daily after breakfast. 11/16/20  Yes Shamleffer, Konrad Dolores, MD  lisinopril (ZESTRIL) 20 MG tablet Take 1 tablet (20 mg total) by mouth daily. 06/08/20  Yes Dyann Kief, PA-C  lithium carbonate (LITHOBID) 300 MG CR tablet TAKE 2 TABLETS (600 MG TOTAL) BY MOUTH AT BEDTIME. 12/06/20  Yes Hurst, Teresa T, PA-C  metFORMIN (GLUCOPHAGE) 1000 MG tablet TAKE 1 TABLET (1,000 MG TOTAL) BY MOUTH 2 (TWO) TIMES DAILY WITH A MEAL. 01/02/21  Yes Shamleffer, Konrad Dolores, MD   nitroGLYCERIN (NITROSTAT) 0.4 MG SL tablet Place 1 tablet (0.4 mg total) under the tongue every 5 (five) minutes as needed for chest pain. 08/10/20 08/10/21 Yes Georgie Chard D, NP  oxyCODONE-acetaminophen (PERCOCET) 10-325 MG tablet Take 1 tablet by mouth 5 (five) times daily. 10/20/20  Yes [provider]  perphenazine (TRILAFON) 16 MG tablet TAKE 1 PILL TOTAL 16 MG EVERY MORNING AND TAKE 2 PILLS BY MOUTH TOTAL 32 MG EVERY BEDTIME Patient taking differently: Take 32 mg by mouth at bedtime. 10/07/20  Yes Hurst, Glade Nurse, PA-C  pseudoephedrine (SUDAFED) 120 MG 12 hr tablet Take 120 mg by mouth 2 (two) times daily.   Yes [provider]  ticagrelor (BRILINTA) 90 MG TABS tablet Take 1 tablet (90 mg total) by mouth 2 (two) times daily. 06/07/20  Yes Meriam Sprague, MD  traZODone (DESYREL) 100 MG tablet TAKE  3 TABLETS BY MOUTH EVERY DAY AT BEDTIME AS NEEDED FOR SLEEP Patient taking differently: 300 mg at bedtime. 12/08/20  Yes Melony Overly T, PA-C  Vitamin D, Ergocalciferol, (DRISDOL) 1.25 MG (50000 UNIT) CAPS capsule Take 50,000 Units by mouth every Sunday. 11/28/20  Yes [provider]  icosapent Ethyl (VASCEPA) 1 g capsule Take 2 capsules (2 g total) by mouth 2 (two) times daily. Patient not taking: Reported on 02/24/2021 01/20/21   Supple, Megan E, RPH-CPP  Insulin Pen Needle 32G X 4 MM MISC 1 Device by Does not apply route daily. 08/15/20   Shamleffer, Konrad Dolores, MD  mirtazapine (REMERON) 15 MG tablet TAKE 0.5-1 TABLETS (7.5-15 MG TOTAL) BY MOUTH AT BEDTIME. Patient not taking: Reported on 02/24/2021 02/09/21   Cherie Ouch, PA-C  spironolactone (ALDACTONE) 25 MG tablet Take 0.5 tablets (12.5 mg total) by mouth daily. Patient not taking: No sig reported 07/20/20 07/15/21  Meriam Sprague, MD    Allergies    Gluten meal and Lactose intolerance (gi)  Review of Systems   Review of Systems  Constitutional:  Positive for diaphoresis and fatigue. Negative for  fever.  HENT:  Negative for sore throat.   Eyes:  Negative for visual disturbance.  Respiratory:  Positive for cough and shortness of breath.   Cardiovascular:  Positive for chest pain.  Gastrointestinal:  Positive for diarrhea, nausea and vomiting. Negative for abdominal pain.  Genitourinary:  Negative for difficulty urinating.  Musculoskeletal:  Negative for back pain (a little lower back pain, no change from before with herniated disc) and neck stiffness.  Skin:  Negative for rash.  Neurological:  Positive for light-headedness. Negative for syncope and headaches.   Physical Exam Updated Vital Signs BP 118/73   Pulse (!) 101   Temp 98 F (36.7 C) (Oral)   Resp 18   SpO2 92%   Physical Exam Vitals and nursing note reviewed.  Constitutional:      General: He is not in acute distress.    Appearance: He is well-developed. He is not diaphoretic.  HENT:     Head: Normocephalic and atraumatic.  Eyes:     Conjunctiva/sclera: Conjunctivae normal.  Cardiovascular:     Rate and Rhythm: Normal rate and regular rhythm.     Heart sounds: Normal heart sounds. No murmur heard.   No friction rub. No gallop.  Pulmonary:     Effort: Pulmonary effort is normal. No respiratory distress.     Breath sounds: Normal breath sounds. No wheezing or rales.  Abdominal:     General: There is no distension.     Palpations: Abdomen is soft.     Tenderness: There is no abdominal tenderness. There is no guarding.  Musculoskeletal:     Cervical back: Normal range of motion.  Skin:    General: Skin is warm and dry.  Neurological:     Mental Status: He is alert and oriented to person, place, and time.    ED Results / Procedures / Treatments   Labs (all labs ordered are listed, but only abnormal results are displayed) Labs Reviewed  CBC WITH DIFFERENTIAL/PLATELET - Abnormal; Notable for the following components:      Result Value   WBC 13.6 (*)    Neutro Abs 8.8 (*)    Eosinophils Absolute 0.6 (*)     All other components within normal limits  COMPREHENSIVE METABOLIC PANEL - Abnormal; Notable for the following components:   Sodium 132 (*)    CO2 21 (*)  Glucose, Bld 149 (*)    AST 55 (*)    ALT 84 (*)    All other components within normal limits  GLUCOSE, CAPILLARY - Abnormal; Notable for the following components:   Glucose-Capillary 184 (*)    All other components within normal limits  RESP PANEL BY RT-PCR (FLU A&B, COVID) ARPGX2  D-DIMER, QUANTITATIVE  LIPID PANEL  BASIC METABOLIC PANEL  TROPONIN I (HIGH SENSITIVITY)  TROPONIN I (HIGH SENSITIVITY)    EKG None  Radiology CARDIAC CATHETERIZATION  Result Date: 02/24/2021 CONCLUSIONS: Normal left main Patent LAD stent with minimal luminal irregularities otherwise. 25% mid circumflex. Dominant right coronary with patent distal RCA stent.  Proximal tandem 25 and 45% stenoses.  Mild to moderate diffuse atherosclerosis noted in the right coronary. Anterior hypokinesis with EF 50%.  LVEDP 7 mmHg. RECOMMENDATIONS: Aggressive risk factor modification including smoking cessation, glycemic control, blood pressure control, and lipid management of LDL to less than 70.  DG Chest Portable 1 View  Result Date: 02/24/2021 CLINICAL DATA:  Chest pain EXAM: PORTABLE CHEST 1 VIEW COMPARISON:  11/09/2020 FINDINGS: The heart size and mediastinal contours are within normal limits. Both lungs are clear. The visualized skeletal structures are unremarkable. IMPRESSION: No active disease. Electronically Signed   By: Elige Ko M.D.   On: 02/24/2021 15:50    Procedures Procedures   Medications Ordered in ED Medications  aspirin EC tablet 81 mg ( Oral MAR Unhold 02/24/21 1812)  nitroGLYCERIN (NITROSTAT) SL tablet 0.4 mg ( Sublingual MAR Unhold 02/24/21 1812)  acetaminophen (TYLENOL) tablet 650 mg ( Oral MAR Unhold 02/24/21 1812)  ondansetron (ZOFRAN) injection 4 mg ( Intravenous MAR Unhold 02/24/21 1812)  sodium chloride flush (NS) 0.9 % injection 3  mL (0 mLs Intravenous Duplicate 02/24/21 2232)  ticagrelor (BRILINTA) tablet 90 mg (90 mg Oral Given 02/24/21 2224)  carvedilol (COREG) tablet 25 mg (25 mg Oral Given 02/24/21 2019)  atorvastatin (LIPITOR) tablet 80 mg (80 mg Oral Given 02/24/21 2019)  empagliflozin (JARDIANCE) tablet 25 mg ( Oral MAR Unhold 02/24/21 1812)  isosorbide mononitrate (IMDUR) 24 hr tablet 30 mg (30 mg Oral Given 02/24/21 2019)  lisinopril (ZESTRIL) tablet 20 mg ( Oral MAR Unhold 02/24/21 1812)  perphenazine (TRILAFON) tablet 32 mg (32 mg Oral Given 02/24/21 2224)  spironolactone (ALDACTONE) tablet 12.5 mg (12.5 mg Oral Given 02/24/21 2019)  clonazePAM (KLONOPIN) tablet 1 mg (1 mg Oral Given 02/24/21 2019)  DULoxetine (CYMBALTA) DR capsule 60 mg (60 mg Oral Given 02/24/21 2224)  gabapentin (NEURONTIN) capsule 800 mg (800 mg Oral Given 02/24/21 2224)  lithium carbonate (LITHOBID) CR tablet 600 mg (600 mg Oral Given 02/24/21 2224)  labetalol (NORMODYNE) injection 10 mg (has no administration in time range)  hydrALAZINE (APRESOLINE) injection 10 mg (has no administration in time range)  heparin injection 5,000 Units (5,000 Units Subcutaneous Patient Refused/Not Given 02/24/21 2151)  0.9 %  sodium chloride infusion ( Intravenous Rate/Dose Change 02/24/21 2020)  sodium chloride flush (NS) 0.9 % injection 3 mL (3 mLs Intravenous Not Given 02/24/21 2232)  sodium chloride flush (NS) 0.9 % injection 3 mL (has no administration in time range)  0.9 %  sodium chloride infusion (has no administration in time range)  insulin glargine-yfgn (SEMGLEE) injection 20 Units (has no administration in time range)  nicotine (NICODERM CQ - dosed in mg/24 hours) patch 21 mg (21 mg Transdermal Patch Applied 02/24/21 2138)  oxyCODONE-acetaminophen (PERCOCET/ROXICET) 5-325 MG per tablet 1 tablet (1 tablet Oral Given 02/24/21 2017)    And  oxyCODONE (Oxy IR/ROXICODONE) immediate release tablet 5 mg (5 mg Oral Given 02/24/21 2017)  sodium chloride 0.9 % bolus  1,000 mL (0 mLs Intravenous Stopped 02/24/21 1529)  ondansetron (ZOFRAN) injection 4 mg (4 mg Intravenous Given 02/24/21 1400)  clonazePAM (KLONOPIN) disintegrating tablet 1 mg (1 mg Oral Given 02/24/21 1410)  morphine 4 MG/ML injection 4 mg (4 mg Intravenous Given 02/24/21 1436)  morphine 4 MG/ML injection 4 mg (4 mg Intravenous Given 02/24/21 1532)    ED Course  I have reviewed the triage vital signs and the nursing notes.  Pertinent labs & imaging results that were available during my care of the patient were reviewed by me and considered in my medical decision making (see chart for details).    MDM Rules/Calculators/A&P                           45 year old male with a history of diabetes, hypertension, schizoaffective disorder, coronary artery disease with history of NSTEMI treated with DES to the LAD and RCA in January 2022, history of heart failure with improved ejection fraction, hyperlipidemia, celiac disease who presents with concern for chest pain.  CBC without significant anemia. DDimer negative.  EKG unchanged from prior. History and exam not consistent with dissection. CXR without pneumonia, pneumothorax. Awaiting labs at time of transfer of care to Dr. Lockie Mola.      Final Clinical Impression(s) / ED Diagnoses Final diagnoses:  Chest pain, unspecified type    Rx / DC Orders ED Discharge Orders     None        Alvira Monday, MD 02/24/21 2253

## 2021-02-24 NOTE — ED Provider Notes (Signed)
Patient signed out to me at 3 PM.  History of CAD.  Here with exertional chest pain for the last day.  Pain has improved with nitroglycerin and morphine.  Possibly some GI symptoms as well.  Not having any abdominal pain.  Troponin within normal limits.  EKG shows no new ischemic changes.  Lab work otherwise unremarkable.  D-dimer normal.  Doubt PE.  Chest x-ray negative for infection or pneumothorax.  Cardiology has been consulted and will come down to the ED to evaluate the patient for possible admission.  Patient to be admitted to cardiology.  This chart was dictated using voice recognition software.  Despite best efforts to proofread,  errors can occur which can change the documentation meaning.    Virgina Norfolk, DO 02/24/21 1625

## 2021-02-24 NOTE — H&P (Signed)
Cardiology Admission History and Physical:   Patient ID: ASHRAF MESTA MRN: [id number]; DOB: 1975-06-11   Admission date: 02/24/2021  PCP:  [name], MD   [facility] Providers Cardiologist:  Meriam Sprague, MD        Chief Complaint:  chest pain  Patient Profile:   Matthew [name] is a 45 y.o. male with known CAD who is being seen 02/24/2021 for the evaluation of chest pain and dyspnea.  History of Present Illness:   Mr. Kreiter has a history of schizoaffective disorder with anxiety, CAD, DM on insulin, HTN, HLD. He is s/p cardiac cath in Jan 2022 with severe 2 vessel CAD with stenting of LAD and RCA at that time. Had EF 25% then that improved to normal on follow up Echo in March. He has had several ED visits this year for chest pain. Was seen in office in July and stress myoview ordered but he did not show. He reports compliance with medical therapy. Is still smoking 2 ppd. Denies illicit drug use or Etoh. Over the past 4 days he has experienced worsening dyspnea on exertion. Last night he developed mid substernal chest pain that has worsened during the day. No relief with sl NTG or Imdur. No palpitations. Notes some increased anxiety recently and has experienced some hallucinations.    Past Medical History:  Diagnosis Date   Anxiety    Celiac disease    Coronary artery disease    Dairy allergy    Depression    Diabetes mellitus without complication (HCC)    High cholesterol    Hypertension    Paranoia (HCC)    Pre-diabetes    Schizoaffective disorder (HCC)     Past Surgical History:  Procedure Laterality Date   APPENDECTOMY     BRAIN SURGERY     CARDIAC CATHETERIZATION     CORONARY STENT INTERVENTION N/A 06/06/2020   Procedure: CORONARY STENT INTERVENTION;  Surgeon: [place], Inioluwa Boulay M, MD;  Location: [facility] INVASIVE CV LAB;  Service: Cardiovascular;  Laterality: N/A;  prox LAD, distal RCA   INTRAVASCULAR ULTRASOUND/IVUS N/A 06/06/2020   Procedure: Intravascular  Ultrasound/IVUS;  Surgeon: [place], Kinesha Auten M, MD;  Location: [facility] INVASIVE CV LAB;  Service: Cardiovascular;  Laterality: N/A;   LEFT HEART CATH AND CORONARY ANGIOGRAPHY N/A 06/06/2020   Procedure: LEFT HEART CATH AND CORONARY ANGIOGRAPHY;  Surgeon: [place], Daylon Lafavor M, MD;  Location: [facility] INVASIVE CV LAB;  Service: Cardiovascular;  Laterality: N/A;   NASAL SEPTUM SURGERY     WRIST FRACTURE SURGERY     right wrist     Medications Prior to Admission: Prior to Admission medications   Medication Sig Start Date End Date Taking? Authorizing Provider  aspirin EC 81 MG tablet Take 1 tablet (81 mg total) by mouth daily. Swallow whole. 06/07/20   Meriam Sprague, MD  atorvastatin (LIPITOR) 40 MG tablet Take 40 mg by mouth daily.    [provider]  carvedilol (COREG) 25 MG tablet Take 1 tablet (25 mg total) by mouth 2 (two) times daily. 10/10/20   Meriam Sprague, MD  clonazePAM (KLONOPIN) 1 MG tablet Take 1 tablet (1 mg total) by mouth 3 (three) times daily as needed for anxiety. 02/04/21   Cherie Ouch, PA-C  DULoxetine (CYMBALTA) 60 MG capsule Take 1 capsule (60 mg total) by mouth 2 (two) times daily. 12/08/20   Cherie Ouch, PA-C  empagliflozin (JARDIANCE) 25 MG TABS tablet Take 1 tablet (25 mg total) by mouth daily before  breakfast. 10/10/20   Meriam Sprague, MD  gabapentin (NEURONTIN) 800 MG tablet 1 po q am, 1/2 po at noon, 1 po at 4 pm, and 1 po at bedtime. 12/07/20   Cherie Ouch, PA-C  icosapent Ethyl (VASCEPA) 1 g capsule Take 2 capsules (2 g total) by mouth 2 (two) times daily. 01/20/21   Supple, Megan E, RPH-CPP  insulin degludec (TRESIBA FLEXTOUCH) 100 UNIT/ML FlexTouch Pen Inject 20 Units into the skin daily. 11/16/20   Shamleffer, Konrad Dolores, MD  Insulin Pen Needle 32G X 4 MM MISC 1 Device by Does not apply route daily. 08/15/20   Shamleffer, Konrad Dolores, MD  isosorbide mononitrate (IMDUR) 30 MG 24 hr tablet Take 0.5 tablets (15 mg total) by mouth daily. Patient not  taking: Reported on 01/17/2021 08/10/20   Wynetta Fines, MD  lisinopril (ZESTRIL) 20 MG tablet Take 1 tablet (20 mg total) by mouth daily. 06/08/20   Dyann Kief, PA-C  lithium carbonate (LITHOBID) 300 MG CR tablet TAKE 2 TABLETS (600 MG TOTAL) BY MOUTH AT BEDTIME. 12/06/20   Hurst, Rosey Bath T, PA-C  metFORMIN (GLUCOPHAGE) 1000 MG tablet TAKE 1 TABLET (1,000 MG TOTAL) BY MOUTH 2 (TWO) TIMES DAILY WITH A MEAL. 01/02/21   Shamleffer, Konrad Dolores, MD  mirtazapine (REMERON) 15 MG tablet TAKE 0.5-1 TABLETS (7.5-15 MG TOTAL) BY MOUTH AT BEDTIME. 02/09/21   Hurst, Rosey Bath T, PA-C  nitroGLYCERIN (NITROSTAT) 0.4 MG SL tablet Place 1 tablet (0.4 mg total) under the tongue every 5 (five) minutes as needed for chest pain. 08/10/20 08/10/21  Georgie Chard D, NP  oxyCODONE-acetaminophen (PERCOCET) 10-325 MG tablet SMARTSIG:0.5-1 Tablet(s) By Mouth Every 8-12 Hours PRN 10/20/20   [provider]  perphenazine (TRILAFON) 16 MG tablet TAKE 1 PILL TOTAL 16 MG EVERY MORNING AND TAKE 2 PILLS BY MOUTH TOTAL 32 MG EVERY BEDTIME 10/07/20   Hurst, Glade Nurse, PA-C  spironolactone (ALDACTONE) 25 MG tablet Take 0.5 tablets (12.5 mg total) by mouth daily. 07/20/20 07/15/21  Meriam Sprague, MD  ticagrelor (BRILINTA) 90 MG TABS tablet Take 1 tablet (90 mg total) by mouth 2 (two) times daily. 06/07/20   Meriam Sprague, MD  traZODone (DESYREL) 100 MG tablet TAKE 3 TABLETS BY MOUTH EVERY DAY AT BEDTIME AS NEEDED FOR SLEEP 12/08/20   Melony Overly T, PA-C  Vitamin D, Ergocalciferol, (DRISDOL) 1.25 MG (50000 UNIT) CAPS capsule Take 50,000 Units by mouth once a week. 11/28/20   [provider]     Allergies:    Allergies  Allergen Reactions   Gluten Meal Other (See Comments)    Celiac disease   Lactose Intolerance (Gi) Diarrhea    Can tolerate hard cheese    Social History:   Social History   Socioeconomic History   Marital status: Single    Spouse name: Not on file   Number of children: 0   Years of  education: 14   Highest education level: Associate degree: occupational, Scientist, product/process development, or vocational program  Occupational History   Occupation: [employer], unemployed  Tobacco Use   Smoking status: Heavy Smoker    Packs/day: 1.00    Types: Cigarettes   Smokeless tobacco: Never   Tobacco comments:    patient given phone number to 1-800-quit now on 08/16/20  Vaping Use   Vaping Use: Never used  Substance and Sexual Activity   Alcohol use: No    Alcohol/week: 0.0 standard drinks    Comment: no (02/08/15)   Drug use: No  Sexual activity: Not on file  Other Topics Concern   Not on file  Social History Narrative   Pt lives at the [facility]   Social Determinants of Health   Financial Resource Strain: Not on file  Food Insecurity: Not on file  Transportation Needs: Not on file  Physical Activity: Not on file  Stress: Not on file  Social Connections: Not on file  Intimate Partner Violence: Not on file    Family History:   The patient's family history includes Asthma in his mother; Hypertension in his father and mother.    ROS:  Please see the history of present illness.  All other ROS reviewed and negative.     Physical Exam/Data:   Vitals:   02/24/21 1415 02/24/21 1430 02/24/21 1445 02/24/21 1500  BP: 122/89 110/72 129/85 130/85  Pulse: (!) 113 (!) 104 96 95  Resp: 14 16 (!) 23 (!) 21  Temp:      TempSrc:      SpO2: 96% 95% 93% 95%   No intake or output data in the 24 hours ending 02/24/21 1633 Last 3 Weights 11/30/2020 11/16/2020 11/09/2020  Weight (lbs) 272 lb 6.4 oz 267 lb 270 lb  Weight (kg) 123.56 kg 121.11 kg 122.471 kg     There is no height or weight on file to calculate BMI.  General:  Well nourished, well developed, in no acute distress-obese HEENT: normal Neck: no JVD Vascular: No carotid bruits; Distal pulses 2+ bilaterally   Cardiac:  normal S1, S2; RRR; no murmur  Lungs:  clear to auscultation bilaterally, no wheezing, rhonchi or rales  Abd: soft,  nontender, no hepatomegaly  Ext: no edema Musculoskeletal:  No deformities, BUE and BLE strength normal and equal Skin: warm and dry  Neuro:  CNs 2-12 intact, no focal abnormalities noted Psych:  Normal affect    EKG:  The ECG that was done today was personally reviewed and demonstrates NSR with old anterior and inferior infarcts. I have personally reviewed and interpreted this study.   Relevant CV Studies: Echo 08/12/20:IMPRESSIONS     1. Left ventricular ejection fraction, by estimation, is 65 to 70%. The  left ventricle has normal function. The left ventricle has no regional  wall motion abnormalities. There is mild left ventricular hypertrophy.  Left ventricular diastolic parameters  were normal.   2. Right ventricular systolic function is normal. The right ventricular  size is normal.   3. The mitral valve is normal in structure. No evidence of mitral valve  regurgitation. No evidence of mitral stenosis.   4. The aortic valve is normal in structure. Aortic valve regurgitation is  not visualized. No aortic stenosis is present.   5. The inferior vena cava is normal in size with greater than 50%  respiratory variability, suggesting right atrial pressure of 3 mmHg.   Cardiac cath 06/06/20: Procedures  CORONARY STENT INTERVENTION  Intravascular Ultrasound/IVUS  LEFT HEART CATH AND CORONARY ANGIOGRAPHY   Conclusion    Dist RCA lesion is 90% stenosed. A drug-eluting stent was successfully placed using a STENT RESOLUTE ONYX 3.5X15. Post intervention, there is a 0% residual stenosis. Prox LAD lesion is 99% stenosed. A drug-eluting stent was successfully placed using a STENT RESOLUTE ONYX 3.5X18. Post intervention, there is a 0% residual stenosis. There is severe left ventricular systolic dysfunction. LV end diastolic pressure is moderately elevated. The left ventricular ejection fraction is 25-35% by visual estimate.   1. Severe 2 vessel obstructive CAD    -  99% proximal  LAD    - 90% distal RCA 2. Severe LV dysfunction. EF estimated at 25-30%. Segmental wall motion abnormalities. 3. Moderately elevated LVEDP 4. Successful PCI of the proximal LAD with IVUS guidance and DES x 1 5. Successful PCI of the distal RCA with IVUS guidance and DES x 1   Plan: DAPT for one year. Optimal medical therapy for LV dysfunction. Aggressive risk factor modification.   Diagnostic Dominance: Right Intervention  Implants     Laboratory Data:  High Sensitivity Troponin:   Recent Labs  Lab 02/24/21 1400  TROPONINIHS 3      Chemistry Recent Labs  Lab 02/24/21 1400  NA 132*  K 3.9  CL 98  CO2 21*  GLUCOSE 149*  BUN 12  CREATININE 1.00  CALCIUM 9.7  GFRNONAA >60  ANIONGAP 13    Recent Labs  Lab 02/24/21 1400  PROT 7.1  ALBUMIN 4.1  AST 55*  ALT 84*  ALKPHOS 93  BILITOT 0.6   Lipids No results for input(s): CHOL, TRIG, HDL, LABVLDL, LDLCALC, CHOLHDL in the last 168 hours. Hematology Recent Labs  Lab 02/24/21 1400  WBC 13.6*  RBC 5.76  HGB 16.5  HCT 48.3  MCV 83.9  MCH 28.6  MCHC 34.2  RDW 14.1  PLT 328   Thyroid No results for input(s): TSH, FREET4 in the last 168 hours. BNPNo results for input(s): BNP, PROBNP in the last 168 hours.  DDimer  Recent Labs  Lab 02/24/21 1400  DDIMER <0.27     Radiology/Studies:  DG Chest Portable 1 View  Result Date: 02/24/2021 CLINICAL DATA:  Chest pain EXAM: PORTABLE CHEST 1 VIEW COMPARISON:  11/09/2020 FINDINGS: The heart size and mediastinal contours are within normal limits. Both lungs are clear. The visualized skeletal structures are unremarkable. IMPRESSION: No active disease. Electronically Signed   By: Elige Ko M.D.   On: 02/24/2021 15:50     Assessment and Plan:   Unstable angina. Troponins negative but they were also negative when he had his stents in Jan. Symptoms are typical. Ecg is chronically abnormal. He is on optimal medical therapy and reports compliance. I recommend proceeding  directly to cardiac cath. The procedure and risks were reviewed including but not limited to death, myocardial infarction, stroke, arrythmias, bleeding, transfusion, emergency surgery, dye allergy, or renal dysfunction. The patient voices understanding and is agreeable to proceed. Will give IV heparin bolus. Continue home anginal meds Schizoaffective disorder. Continue home meds. DM continue home meds but hold metformin. SSI HLD high dose statin HTN. Continue home meds.  Tobacco abuse counsel on smoking cessation   Risk Assessment/Risk Scores:    TIMI Risk Score for Unstable Angina or Non-ST Elevation MI:   The patient's TIMI risk score is 4, which indicates a 20% risk of all cause mortality, new or recurrent myocardial infarction or need for urgent revascularization in the next 14 days.       Severity of Illness: The appropriate patient status for this patient is INPATIENT. Inpatient status is judged to be reasonable and necessary in order to provide the required intensity of service to ensure the patient's safety. The patient's presenting symptoms, physical exam findings, and initial radiographic and laboratory data in the context of their chronic comorbidities is felt to place them at high risk for further clinical deterioration. Furthermore, it is not anticipated that the patient will be medically stable for discharge from the hospital within 2 midnights of admission. The following factors support the patient status of  inpatient.   " The patient's presenting symptoms include chest pain and dyspnea. " The worrisome physical exam findings include none. " The initial radiographic and laboratory data are worrisome because of  abnormal Ecg. " The chronic co-morbidities include DM on insulin, HTN, HLD, tobacco abuse.   * I certify that at the point of admission it is my clinical judgment that the patient will require inpatient hospital care spanning beyond 2 midnights from the point of  admission due to high intensity of service, high risk for further deterioration and high frequency of surveillance required.*   For questions or updates, please contact [facility] Please consult [email or web address] for contact info under     Signed, Lanna Labella [place], MD  02/24/2021 4:33 PM

## 2021-02-24 NOTE — ED Triage Notes (Signed)
Pt from [facility] BIB EMS for c/o CP that started around midnight this am. Called EMS around PTA when the pain got worse. Pt took 1 tablet of ASA of unknown mg and nitro x1 prior to EMS arrival. EMS gave nitro SL x1. No relief to CP. Pt reports hx of MI in Jan 2022. Pt states he also has been having N/V x 2 days.  20 L wrist 136/100 ST/HR 106 BGL 158

## 2021-02-24 NOTE — CV Procedure (Signed)
Patent LAD and distal RCA stents. No new/de novo high-grade obstruction. LVEF 45 to 50% with mid anterior wall hypokinesis.  LVEDP 7 mmHg.

## 2021-02-24 NOTE — Interval H&P Note (Signed)
Cath Lab Visit (complete for each Cath Lab visit)  Clinical Evaluation Leading to the Procedure:   ACS: Yes.    Non-ACS:    Anginal Classification: CCS IV  Anti-ischemic medical therapy: Minimal Therapy (1 class of medications)  Non-Invasive Test Results: No non-invasive testing performed  Prior CABG: No previous CABG      History and Physical Interval Note:  02/24/2021 5:07 PM  Matthew [name]  has presented today for surgery, with the diagnosis of chest pain.  The various methods of treatment have been discussed with the patient and family. After consideration of risks, benefits and other options for treatment, the patient has consented to  Procedure(s): LEFT HEART CATH AND CORONARY ANGIOGRAPHY (N/A) as a surgical intervention.  The patient's history has been reviewed, patient examined, no change in status, stable for surgery.  I have reviewed the patient's chart and labs.  Questions were answered to the patient's satisfaction.     Lyn Records III

## 2021-02-25 DIAGNOSIS — I25118 Atherosclerotic heart disease of native coronary artery with other forms of angina pectoris: Secondary | ICD-10-CM | POA: Diagnosis not present

## 2021-02-25 DIAGNOSIS — E871 Hypo-osmolality and hyponatremia: Secondary | ICD-10-CM | POA: Diagnosis not present

## 2021-02-25 DIAGNOSIS — I200 Unstable angina: Secondary | ICD-10-CM | POA: Diagnosis not present

## 2021-02-25 DIAGNOSIS — Z20822 Contact with and (suspected) exposure to covid-19: Secondary | ICD-10-CM | POA: Diagnosis not present

## 2021-02-25 DIAGNOSIS — R079 Chest pain, unspecified: Secondary | ICD-10-CM

## 2021-02-25 DIAGNOSIS — F250 Schizoaffective disorder, bipolar type: Secondary | ICD-10-CM | POA: Diagnosis not present

## 2021-02-25 DIAGNOSIS — E1165 Type 2 diabetes mellitus with hyperglycemia: Secondary | ICD-10-CM | POA: Diagnosis not present

## 2021-02-25 LAB — BASIC METABOLIC PANEL
Anion gap: 11 (ref 5–15)
BUN: 15 mg/dL (ref 6–20)
CO2: 19 mmol/L — ABNORMAL LOW (ref 22–32)
Calcium: 8.9 mg/dL (ref 8.9–10.3)
Chloride: 98 mmol/L (ref 98–111)
Creatinine, Ser: 1.09 mg/dL (ref 0.61–1.24)
GFR, Estimated: >60 mL/min (ref >60)
Glucose, Bld: 228 mg/dL — ABNORMAL HIGH (ref 70–99)
Potassium: 3.9 mmol/L (ref 3.5–5.1)
Sodium: 128 mmol/L — ABNORMAL LOW (ref 135–145)

## 2021-02-25 LAB — GLUCOSE, CAPILLARY: Glucose-Capillary: 178 mg/dL — ABNORMAL HIGH (ref 70–99)

## 2021-02-25 LAB — LIPID PANEL
Cholesterol: 159 mg/dL (ref 0–200)
HDL: 22 mg/dL — ABNORMAL LOW (ref >40)
LDL Cholesterol: UNDETERMINED mg/dL (ref 0–99)
Total CHOL/HDL Ratio: 7.2 RATIO
Triglycerides: 791 mg/dL — ABNORMAL HIGH (ref <150)
VLDL: UNDETERMINED mg/dL (ref 0–40)

## 2021-02-25 LAB — LDL CHOLESTEROL, DIRECT: Direct LDL: 42.4 mg/dL (ref 0–99)

## 2021-02-25 MED ORDER — ATORVASTATIN CALCIUM 80 MG PO TABS: 80.0000 mg | ORAL_TABLET | Freq: Every morning | ORAL | 6 refills | Status: DC

## 2021-02-25 MED ORDER — NICOTINE 21 MG/24HR TD PT24: 21.0000 mg | MEDICATED_PATCH | Freq: Every day | TRANSDERMAL | 0 refills | Status: DC

## 2021-02-25 MED ORDER — OXYCODONE-ACETAMINOPHEN 5-325 MG PO TABS
2.0000 | ORAL_TABLET | Freq: Once | ORAL | Status: AC
  Administered 2021-02-25: 2 via ORAL
  Filled 2021-02-25: qty 2

## 2021-02-25 MED ORDER — ISOSORBIDE MONONITRATE ER 30 MG PO TB24: 30.0000 mg | ORAL_TABLET | Freq: Every day | ORAL | 6 refills | Status: DC

## 2021-02-25 NOTE — Progress Notes (Signed)
Discharge instructions provided to patient. All medications, follow up appointments, and discharge instructions discussed. IV out. Monitor off. CCMD notified. Discharging to home.  Vivan Vanderveer R Vani Gunner, RN  

## 2021-02-25 NOTE — Progress Notes (Signed)
Chest pain is resolved.  Cath reviewed.  He has chronic low back pain which is unchanged.  Denies SOB.    Will plan discharge to home today with close outpatient follow-up  Hillis Range MD, [facility] 02/25/2021 8:26 AM

## 2021-02-25 NOTE — Discharge Instructions (Signed)

## 2021-02-25 NOTE — Discharge Summary (Signed)
Discharge Summary    Patient ID: Matthew [name] MRN: [id number]; DOB: 11-Oct-1975  Admit date: 02/24/2021 Discharge date: 02/25/2021  PCP:  [name], MD   [facility] Providers Cardiologist:  Meriam Sprague, MD        Discharge Diagnoses    Principal Problem:   Unstable angina [facility]) Active Problems:   Schizoaffective disorder, bipolar type without good prognostic features (HCC)   Uncontrolled type 2 diabetes mellitus with hyperglycemia [facility])    Diagnostic Studies/Procedures    CARDIAC CATH: 02/24/2021 CONCLUSIONS: Normal left main Patent LAD stent with minimal luminal irregularities otherwise. 25% mid circumflex. Dominant right coronary with patent distal RCA stent.  Proximal tandem 25 and 45% stenoses.  Mild to moderate diffuse atherosclerosis noted in the right coronary. Anterior hypokinesis with EF 50%.  LVEDP 7 mmHg.   RECOMMENDATIONS:   Aggressive risk factor modification including smoking cessation, glycemic control, blood pressure control, and lipid management of LDL to less than 70. Diagnostic Dominance: Right  _____________   History of Present Illness     Matthew [name] is a 45 y.o. male with a history of schizoaffective disorder with anxiety, CAD, DM on insulin, HTN, HLD. He is s/p cardiac cath in Jan 2022 with severe 2 vessel CAD with stenting of LAD and RCA at that time. Had EF 25% then that improved to normal on follow up Echo in March. Is still smoking 2 ppd.  Came to the ER on 10/07 with chest pain, admitted and cathed.  Hospital Course     Consultants: None   Cardiac catheterization results are above.  He had multivessel but nonobstructive disease, medical therapy recommended.  Imdur was added to his medication regimen.  On 10/08, he was seen by Dr. Johney Frame and all data were reviewed.  His chest pain had resolved.  His renal function was stable post-cath.  His blood sugars are elevated and his triglycerides were almost 800.  His LDL  could not be calculated.  His Lipitor was increased from 40 mg to 80 mg daily.  He is encouraged to stick tightly to a low cholesterol diabetic diet and follow-up with his PCP.  He has some chronic lower back pain that is unchanged.  No further cardiac work-up is indicated and he is considered stable for discharge, to follow-up as an outpatient.  Did the patient have an acute coronary syndrome (MI, NSTEMI, STEMI, etc) this admission?:  No                               Did the patient have a percutaneous coronary intervention (stent / angioplasty)?:  No.       _____________  Discharge Vitals Blood pressure 130/85, pulse 87, temperature 97.6 F (36.4 C), temperature source Oral, resp. rate 18, SpO2 100 %.  General: Well developed, well nourished, male in no acute distress Head: Eyes PERRLA, Head normocephalic and atraumatic Lungs: clear bilaterally to auscultation. Heart: HRRR S1 S2, without rub or gallop. No murmur. 4/4 extremity pulses are 2+ & equal. No JVD. Abdomen: Bowel sounds are present, abdomen soft and non-tender without masses or  hernias noted. Msk: Normal strength and tone for age. Extremities: No clubbing, cyanosis or edema.    Skin:  No rashes or lesions noted. Neuro: Alert and oriented X 3. Psych:  Good affect, responds appropriately    Labs & Radiologic Studies    CBC Recent Labs    02/24/21 1400  WBC  13.6*  NEUTROABS 8.8*  HGB 16.5  HCT 48.3  MCV 83.9  PLT 328   Basic Metabolic Panel Recent Labs    63/33/54 1400 02/25/21 0139  NA 132* 128*  K 3.9 3.9  CL 98 98  CO2 21* 19*  GLUCOSE 149* 228*  BUN 12 15  CREATININE 1.00 1.09  CALCIUM 9.7 8.9   Liver Function Tests Recent Labs    02/24/21 1400  AST 55*  ALT 84*  ALKPHOS 93  BILITOT 0.6  PROT 7.1  ALBUMIN 4.1   No results for input(s): LIPASE, AMYLASE in the last 72 hours. High Sensitivity Troponin:   Recent Labs  Lab 02/24/21 1400 02/24/21 1649  TROPONINIHS 3 3    BNP Invalid  input(s): POCBNP D-Dimer Recent Labs    02/24/21 1400  DDIMER <0.27   Hemoglobin A1C No results for input(s): HGBA1C in the last 72 hours. Fasting Lipid Panel Recent Labs    02/25/21 0139  CHOL 159  HDL 22*  LDLCALC UNABLE TO CALCULATE IF TRIGLYCERIDE OVER 400 mg/dL  TRIG 562*  CHOLHDL 7.2  LDLDIRECT 42.4   Thyroid Function Tests No results for input(s): TSH, T4TOTAL, T3FREE, THYROIDAB in the last 72 hours.  Invalid input(s): FREET3 _____________  CARDIAC CATHETERIZATION  Result Date: 02/24/2021 CONCLUSIONS: Normal left main Patent LAD stent with minimal luminal irregularities otherwise. 25% mid circumflex. Dominant right coronary with patent distal RCA stent.  Proximal tandem 25 and 45% stenoses.  Mild to moderate diffuse atherosclerosis noted in the right coronary. Anterior hypokinesis with EF 50%.  LVEDP 7 mmHg. RECOMMENDATIONS: Aggressive risk factor modification including smoking cessation, glycemic control, blood pressure control, and lipid management of LDL to less than 70.  DG Chest Portable 1 View  Result Date: 02/24/2021 CLINICAL DATA:  Chest pain EXAM: PORTABLE CHEST 1 VIEW COMPARISON:  11/09/2020 FINDINGS: The heart size and mediastinal contours are within normal limits. Both lungs are clear. The visualized skeletal structures are unremarkable. IMPRESSION: No active disease. Electronically Signed   By: Elige Ko M.D.   On: 02/24/2021 15:50   Disposition   Pt is being discharged home today in good condition.  Follow-up Plans & Appointments     Follow-up Information     Meriam Sprague, MD Follow up.   Specialties: Cardiology, Radiology Why: The office will call with an appointment Contact information: [address] [place] [address] [phone]                Discharge Instructions     Diet - low sodium heart healthy   Complete by: As directed    Diet Carb Modified   Complete by: As directed    Increase activity  slowly   Complete by: As directed        Discharge Medications   Allergies as of 02/25/2021       Reactions   Gluten Meal Other (See Comments)   Celiac disease   Lactose Intolerance (gi) Diarrhea   Can tolerate hard cheese        Medication List     TAKE these medications    aspirin EC 81 MG tablet Take 1 tablet (81 mg total) by mouth daily. Swallow whole.   atorvastatin 80 MG tablet Commonly known as: LIPITOR Take 1 tablet (80 mg total) by mouth every morning. What changed:  medication strength how much to take   carvedilol 25 MG tablet Commonly known as: COREG Take 1 tablet (25 mg total) by mouth 2 (  two) times daily.   clonazePAM 1 MG tablet Commonly known as: KLONOPIN Take 1 tablet (1 mg total) by mouth 3 (three) times daily as needed for anxiety. What changed: when to take this   DULoxetine 60 MG capsule Commonly known as: CYMBALTA Take 1 capsule (60 mg total) by mouth 2 (two) times daily.   empagliflozin 25 MG Tabs tablet Commonly known as: Jardiance Take 1 tablet (25 mg total) by mouth daily before breakfast.   gabapentin 800 MG tablet Commonly known as: NEURONTIN 1 po q am, 1/2 po at noon, 1 po at 4 pm, and 1 po at bedtime. What changed:  how much to take how to take this when to take this additional instructions   icosapent Ethyl 1 g capsule Commonly known as: VASCEPA Take 2 capsules (2 g total) by mouth 2 (two) times daily.   Insulin Pen Needle 32G X 4 MM Misc 1 Device by Does not apply route daily.   isosorbide mononitrate 30 MG 24 hr tablet Commonly known as: IMDUR Take 1 tablet (30 mg total) by mouth daily. Start taking on: February 26, 2021   lisinopril 20 MG tablet Commonly known as: ZESTRIL Take 1 tablet (20 mg total) by mouth daily.   lithium carbonate 300 MG CR tablet Commonly known as: LITHOBID TAKE 2 TABLETS (600 MG TOTAL) BY MOUTH AT BEDTIME.   metFORMIN 1000 MG tablet Commonly known as: GLUCOPHAGE TAKE 1 TABLET (1,000  MG TOTAL) BY MOUTH 2 (TWO) TIMES DAILY WITH A MEAL. Notes to patient: HOLD for 48 hours, restart on 02/27/2021   mirtazapine 15 MG tablet Commonly known as: REMERON TAKE 0.5-1 TABLETS (7.5-15 MG TOTAL) BY MOUTH AT BEDTIME.   nicotine 21 mg/24hr patch Commonly known as: NICODERM CQ - dosed in mg/24 hours Place 1 patch (21 mg total) onto the skin daily. Start taking on: February 26, 2021   nitroGLYCERIN 0.4 MG SL tablet Commonly known as: Nitrostat Place 1 tablet (0.4 mg total) under the tongue every 5 (five) minutes as needed for chest pain.   oxyCODONE-acetaminophen 10-325 MG tablet Commonly known as: PERCOCET Take 1 tablet by mouth 5 (five) times daily.   perphenazine 16 MG tablet Commonly known as: TRILAFON TAKE 1 PILL TOTAL 16 MG EVERY MORNING AND TAKE 2 PILLS BY MOUTH TOTAL 32 MG EVERY BEDTIME What changed: See the new instructions.   pseudoephedrine 120 MG 12 hr tablet Commonly known as: SUDAFED Take 120 mg by mouth 2 (two) times daily.   spironolactone 25 MG tablet Commonly known as: ALDACTONE Take 0.5 tablets (12.5 mg total) by mouth daily.   ticagrelor 90 MG Tabs tablet Commonly known as: BRILINTA Take 1 tablet (90 mg total) by mouth 2 (two) times daily.   traZODone 100 MG tablet Commonly known as: DESYREL TAKE 3 TABLETS BY MOUTH EVERY DAY AT BEDTIME AS NEEDED FOR SLEEP What changed:  how much to take when to take this additional instructions   Tresiba FlexTouch 100 UNIT/ML FlexTouch Pen Generic drug: insulin degludec Inject 20 Units into the skin daily. What changed: when to take this   Vitamin D (Ergocalciferol) 1.25 MG (50000 UNIT) Caps capsule Commonly known as: DRISDOL Take 50,000 Units by mouth every Sunday.           Outstanding Labs/Studies   None  Duration of Discharge Encounter   Greater than 30 minutes including physician time.  Signed, Theodore Demark, PA-C 02/25/2021, 9:04 AM

## 2021-02-27 ENCOUNTER — Encounter (HOSPITAL_COMMUNITY): Payer: Self-pay | Admitting: Interventional Cardiology

## 2021-02-28 ENCOUNTER — Other Ambulatory Visit: Payer: Self-pay | Admitting: Physician Assistant

## 2021-02-28 DIAGNOSIS — F250 Schizoaffective disorder, bipolar type: Secondary | ICD-10-CM

## 2021-02-28 MED FILL — Heparin Sod (Porcine)-NaCl IV Soln 1000 Unit/500ML-0.9%: INTRAVENOUS | Qty: 1000 | Status: AC

## 2021-02-28 NOTE — Telephone Encounter (Signed)
Appt 10/17

## 2021-03-06 ENCOUNTER — Other Ambulatory Visit: Payer: Self-pay

## 2021-03-06 ENCOUNTER — Encounter: Payer: Self-pay | Admitting: Physician Assistant

## 2021-03-06 ENCOUNTER — Ambulatory Visit (INDEPENDENT_AMBULATORY_CARE_PROVIDER_SITE_OTHER): Payer: Self-pay | Admitting: Physician Assistant

## 2021-03-06 DIAGNOSIS — F250 Schizoaffective disorder, bipolar type: Secondary | ICD-10-CM

## 2021-03-06 DIAGNOSIS — F411 Generalized anxiety disorder: Secondary | ICD-10-CM

## 2021-03-06 DIAGNOSIS — F902 Attention-deficit hyperactivity disorder, combined type: Secondary | ICD-10-CM

## 2021-03-06 DIAGNOSIS — Z79899 Other long term (current) drug therapy: Secondary | ICD-10-CM

## 2021-03-06 MED ORDER — TRAZODONE HCL 100 MG PO TABS: ORAL_TABLET | ORAL | 0 refills | Status: DC

## 2021-03-06 MED ORDER — DULOXETINE HCL 60 MG PO CPEP
60.0000 mg | ORAL_CAPSULE | Freq: Two times a day (BID) | ORAL | 0 refills | Status: DC
Start: 2021-03-06 — End: 2021-05-17

## 2021-03-06 MED ORDER — ZOLPIDEM TARTRATE 10 MG PO TABS: 5.0000 mg | ORAL_TABLET | Freq: Every evening | ORAL | 0 refills | Status: DC | PRN

## 2021-03-06 NOTE — Patient Instructions (Signed)
Take melatonin 0.5 to 1 mg approximately 2 hours before you want to go to sleep.

## 2021-03-06 NOTE — Progress Notes (Signed)
[facility] Check  Patient ID: Matthew [name],  MRN: [id number]  PCP: [name], MD  Date of Evaluation: 03/06/2021  time spent:40 minutes  Chief Complaint:  Chief Complaint   Anxiety; Depression; Insomnia; Follow-up     HISTORY/CURRENT STATUS: HPI For routine f/u.   Mirtazepine didn't help, so he went back to Trazodone.  States it does help a little bit.  He has trouble staying asleep more than anything else.  Again he denies caffeine or energy drinks after lunch.  Does still get on his electronic devices before bed.  Never feels rested when he gets up.  Unsure about snoring.  His parents have told him that he has been more irritable and meaner lately because of lack of sleep.  He is still unable to work.  As far as his mood goes he is doing well.  Energy and motivation are low just because he is tired from not resting well.  Able to enjoy things sometimes.  ADLs are normal.  Personal hygiene normal.  Does not cry easily.  Anxiety is still an issue but the Klonopin does help.  Denies suicidal or homicidal thoughts.  Patient denies increased energy with decreased need for sleep, no increased talkativeness, no racing thoughts, no impulsivity or risky behaviors, no increased spending, no increased libido, no grandiosity, no paranoia, and no hallucinations.  Denies dizziness, syncope, seizures, numbness, tingling, tremor, tics, unsteady gait, slurred speech, confusion. Denies muscle or joint pain, stiffness, or dystonia.  Individual Medical History/ Review of Systems: Changes? :Yes   heart cath 02/24/2021. Good results.   Past medications for mental health diagnoses include: Risperdal, Zyprexa, Abilify, [name], Klonopin, Gabapentin, Xanax, Valium, Ativan, Hydroxyzine, Trilafon, Atenolol, Cymbalta, Prozac, Lexapro, Paxil, Lithium, Adderall, Ritalin, Mirtazepine  Allergies: Gluten meal and Lactose intolerance (gi)  Current Medications:  Current Outpatient Medications:     aspirin EC 81 MG tablet, Take 1 tablet (81 mg total) by mouth daily. Swallow whole., Disp: 90 tablet, Rfl: 3   atorvastatin (LIPITOR) 80 MG tablet, Take 1 tablet (80 mg total) by mouth every morning., Disp: 30 tablet, Rfl: 6   carvedilol (COREG) 25 MG tablet, Take 1 tablet (25 mg total) by mouth 2 (two) times daily., Disp: 180 tablet, Rfl: 3   clonazePAM (KLONOPIN) 1 MG tablet, Take 1 tablet (1 mg total) by mouth 3 (three) times daily as needed for anxiety. (Patient taking differently: Take 1 mg by mouth 3 (three) times daily.), Disp: 90 tablet, Rfl: 1   empagliflozin (JARDIANCE) 25 MG TABS tablet, Take 1 tablet (25 mg total) by mouth daily before breakfast., Disp: 90 tablet, Rfl: 2   gabapentin (NEURONTIN) 800 MG tablet, 1 po q am, 1/2 po at noon, 1 po at 4 pm, and 1 po at bedtime. (Patient taking differently: Take 400-800 mg by mouth See admin instructions. Take 1 tablet (800 mg) by mouth every morning, take 1/2 tablet (400 mg) at noon, take 1 tablet (800 mg) at 4pm and at bedtime), Disp: 315 tablet, Rfl: 1   insulin degludec (TRESIBA FLEXTOUCH) 100 UNIT/ML FlexTouch Pen, Inject 20 Units into the skin daily. (Patient taking differently: Inject 20 Units into the skin daily after breakfast.), Disp: 15 mL, Rfl: 11   Insulin Pen Needle 32G X 4 MM MISC, 1 Device by Does not apply route daily., Disp: 150 each, Rfl: 3   lisinopril (ZESTRIL) 20 MG tablet, Take 1 tablet (20 mg total) by mouth daily., Disp: 90 tablet, Rfl: 3   lithium carbonate (LITHOBID) 300  MG CR tablet, TAKE 2 TABLETS (600 MG TOTAL) BY MOUTH AT BEDTIME., Disp: 180 tablet, Rfl: 0   metFORMIN (GLUCOPHAGE) 1000 MG tablet, TAKE 1 TABLET (1,000 MG TOTAL) BY MOUTH 2 (TWO) TIMES DAILY WITH A MEAL., Disp: 180 tablet, Rfl: 3   nitroGLYCERIN (NITROSTAT) 0.4 MG SL tablet, Place 1 tablet (0.4 mg total) under the tongue every 5 (five) minutes as needed for chest pain., Disp: 100 tablet, Rfl: 3   oxyCODONE-acetaminophen (PERCOCET) 10-325 MG tablet, Take 1  tablet by mouth 5 (five) times daily., Disp: , Rfl:    perphenazine (TRILAFON) 16 MG tablet, TAKE 1 PILL TOTAL 16 MG EVERY MORNING AND TAKE 2 PILLS BY MOUTH TOTAL 32 MG EVERY BEDTIME (Patient taking differently: Take 32 mg by mouth at bedtime.), Disp: 270 tablet, Rfl: 0   pseudoephedrine (SUDAFED) 120 MG 12 hr tablet, Take 120 mg by mouth 2 (two) times daily., Disp: , Rfl:    ticagrelor (BRILINTA) 90 MG TABS tablet, Take 1 tablet (90 mg total) by mouth 2 (two) times daily., Disp: 60 tablet, Rfl: 11   Vitamin D, Ergocalciferol, (DRISDOL) 1.25 MG (50000 UNIT) CAPS capsule, Take 50,000 Units by mouth every Sunday., Disp: , Rfl:    zolpidem (AMBIEN) 10 MG tablet, Take 0.5-1 tablets (5-10 mg total) by mouth at bedtime as needed for sleep., Disp: 30 tablet, Rfl: 0   DULoxetine (CYMBALTA) 60 MG capsule, Take 1 capsule (60 mg total) by mouth 2 (two) times daily., Disp: 180 capsule, Rfl: 0   icosapent Ethyl (VASCEPA) 1 g capsule, Take 2 capsules (2 g total) by mouth 2 (two) times daily. (Patient not taking: No sig reported), Disp: 120 capsule, Rfl: 6   isosorbide mononitrate (IMDUR) 30 MG 24 hr tablet, Take 1 tablet (30 mg total) by mouth daily. (Patient not taking: Reported on 03/06/2021), Disp: 30 tablet, Rfl: 6   nicotine (NICODERM CQ - DOSED IN MG/24 HOURS) 21 mg/24hr patch, Place 1 patch (21 mg total) onto the skin daily. (Patient not taking: Reported on 03/06/2021), Disp: 14 patch, Rfl: 0   spironolactone (ALDACTONE) 25 MG tablet, Take 0.5 tablets (12.5 mg total) by mouth daily. (Patient not taking: No sig reported), Disp: 45 tablet, Rfl: 3   traZODone (DESYREL) 100 MG tablet, TAKE 3 TABLETS BY MOUTH EVERY DAY AT BEDTIME AS NEEDED FOR SLEEP, Disp: 270 tablet, Rfl: 0 Medication Side Effects: none  Family Medical/ Social History: Changes? no   MENTAL HEALTH EXAM:  There were no vitals taken for this visit.There is no height or weight on file to calculate BMI.  General Appearance: Casual, Fairly  Groomed, and Obese  Eye Contact:  Good  Speech:  Clear and Coherent and Normal Rate  Volume:  Normal  Mood:  Euthymic  Affect:  Appropriate  Thought Process:  Goal Directed and Descriptions of Associations: Circumstantial  Orientation:  Full (Time, Place, and Person)  Thought Content: Logical   Suicidal Thoughts:  No  Homicidal Thoughts:  No  Memory:  WNL  Judgement:  Good  Insight:  Fair  Psychomotor Activity:  Normal  Concentration:  Concentration: Good and Attention Span: Fair  Recall:  Good  Fund of Knowledge: Good  Language: NA  Assets:  Desire for Improvement  ADL's:  Intact  Cognition: WNL  Prognosis:  Fair   Reviewed [facility] from [facility] medical 11/28/2020 glucose was 222.  No lithium level done. Reviewed hospital note from 02/24/2021, cardiac cath done.  DIAGNOSES:    ICD-10-CM   1. Encounter for long-term (  current) use of medications  Z79.899 Lithium level    2. Generalized anxiety disorder  F41.1     3. Attention deficit hyperactivity disorder (ADHD), combined type, moderate  F90.2     4. Schizoaffective disorder, bipolar type without good prognostic features (HCC)  F25.0 Lithium level      Receiving Psychotherapy: No    RECOMMENDATIONS:  PDMP reviewed.  Last Klonopin filled 03/02/2021.  Lyrica and oxycodone known to me. I provided 40 minutes of face to face time during this encounter, including time spent before and after the visit in records review, medical decision making, and charting.  Discussed the insomnia.  I did prefer not to add another medication but at this point I think it is a must.  The non-controlled substances are not helping so I recommend either Ambien, Sonata, or Lunesta.  Benefits, risk, and side effects were discussed and he accepts.  Will try Ambien. Discussed using melatonin, 0.5 to 1 mg approximately 2 hours prior to bedtime, is more helpful than the higher dose right before bed. Start Ambien 10 mg, 1/2-1 p.o. nightly as needed. Continue  gabapentin 800 mg, 1 p.o. every morning 1/2 pill at noon, 1 p.o. at 4 PM and 1 p.o. nightly. Continue Klonopin 1 mg, 1 p.o. 3 times daily. Continue Cymbalta 60 mg, 1 p.o. twice daily. Continue lithium CR 300 mg, 2 p.o. nightly. Continue perphenazine 16 mg, 1 po every morning and 2 p.o. nightly.  Lithium level ordered.   Needs to be in counseling. Return in 6 weeks.  Melony Overly, PA-C

## 2021-03-24 LAB — LITHIUM LEVEL: Lithium Lvl: 0.2 mmol/L — ABNORMAL LOW (ref 0.5–1.2)

## 2021-03-26 ENCOUNTER — Other Ambulatory Visit: Payer: Self-pay

## 2021-03-26 ENCOUNTER — Emergency Department (HOSPITAL_COMMUNITY): Payer: Medicaid Other

## 2021-03-26 ENCOUNTER — Emergency Department (HOSPITAL_COMMUNITY)
Admission: EM | Admit: 2021-03-26 | Discharge: 2021-03-26 | Disposition: A | Discharge status: Home / Self Care | Payer: Medicaid Other | Attending: Emergency Medicine | Admitting: Emergency Medicine

## 2021-03-26 ENCOUNTER — Encounter (HOSPITAL_COMMUNITY): Payer: Self-pay | Admitting: *Deleted

## 2021-03-26 DIAGNOSIS — Z7982 Long term (current) use of aspirin: Secondary | ICD-10-CM | POA: Insufficient documentation

## 2021-03-26 DIAGNOSIS — Z79899 Other long term (current) drug therapy: Secondary | ICD-10-CM | POA: Diagnosis not present

## 2021-03-26 DIAGNOSIS — F17290 Nicotine dependence, other tobacco product, uncomplicated: Secondary | ICD-10-CM | POA: Insufficient documentation

## 2021-03-26 DIAGNOSIS — R0789 Other chest pain: Secondary | ICD-10-CM | POA: Insufficient documentation

## 2021-03-26 DIAGNOSIS — I119 Hypertensive heart disease without heart failure: Secondary | ICD-10-CM | POA: Insufficient documentation

## 2021-03-26 DIAGNOSIS — Z794 Long term (current) use of insulin: Secondary | ICD-10-CM | POA: Diagnosis not present

## 2021-03-26 DIAGNOSIS — R0602 Shortness of breath: Secondary | ICD-10-CM | POA: Diagnosis not present

## 2021-03-26 DIAGNOSIS — E119 Type 2 diabetes mellitus without complications: Secondary | ICD-10-CM | POA: Diagnosis not present

## 2021-03-26 DIAGNOSIS — R079 Chest pain, unspecified: Secondary | ICD-10-CM

## 2021-03-26 DIAGNOSIS — I2510 Atherosclerotic heart disease of native coronary artery without angina pectoris: Secondary | ICD-10-CM | POA: Diagnosis not present

## 2021-03-26 DIAGNOSIS — Z7984 Long term (current) use of oral hypoglycemic drugs: Secondary | ICD-10-CM | POA: Insufficient documentation

## 2021-03-26 LAB — BASIC METABOLIC PANEL
Anion gap: 13 (ref 5–15)
BUN: 9 mg/dL (ref 6–20)
CO2: 21 mmol/L — ABNORMAL LOW (ref 22–32)
Calcium: 10.4 mg/dL — ABNORMAL HIGH (ref 8.9–10.3)
Chloride: 101 mmol/L (ref 98–111)
Creatinine, Ser: 0.92 mg/dL (ref 0.61–1.24)
GFR, Estimated: >60 mL/min (ref >60)
Glucose, Bld: 196 mg/dL — ABNORMAL HIGH (ref 70–99)
Potassium: 3.8 mmol/L (ref 3.5–5.1)
Sodium: 135 mmol/L (ref 135–145)

## 2021-03-26 LAB — CBC
HCT: 46.9 % (ref 39.0–52.0)
Hemoglobin: 16.1 g/dL (ref 13.0–17.0)
MCH: 28.6 pg (ref 26.0–34.0)
MCHC: 34.3 g/dL (ref 30.0–36.0)
MCV: 83.3 fL (ref 80.0–100.0)
Platelets: 339 10*3/uL (ref 150–400)
RBC: 5.63 MIL/uL (ref 4.22–5.81)
RDW: 14.1 % (ref 11.5–15.5)
WBC: 14.4 10*3/uL — ABNORMAL HIGH (ref 4.0–10.5)
nRBC: 0 % (ref 0.0–0.2)

## 2021-03-26 LAB — RAPID URINE DRUG SCREEN, HOSP PERFORMED
Amphetamines: NOT DETECTED
Barbiturates: NOT DETECTED
Benzodiazepines: NOT DETECTED
Cocaine: NOT DETECTED
Opiates: NOT DETECTED
Tetrahydrocannabinol: NOT DETECTED

## 2021-03-26 LAB — TROPONIN I (HIGH SENSITIVITY)
Troponin I (High Sensitivity): 4 ng/L (ref <18)
Troponin I (High Sensitivity): 3 ng/L (ref <18)

## 2021-03-26 IMAGING — CR DG CHEST 2V
2 series · 2 of 2 positions shown · non-contrast
Comparison: Chest radiograph [DATE]

CLINICAL DATA: Right-sided chest pain that radiates to the back.

EXAM:
CHEST - 2 VIEW

[chest pa]
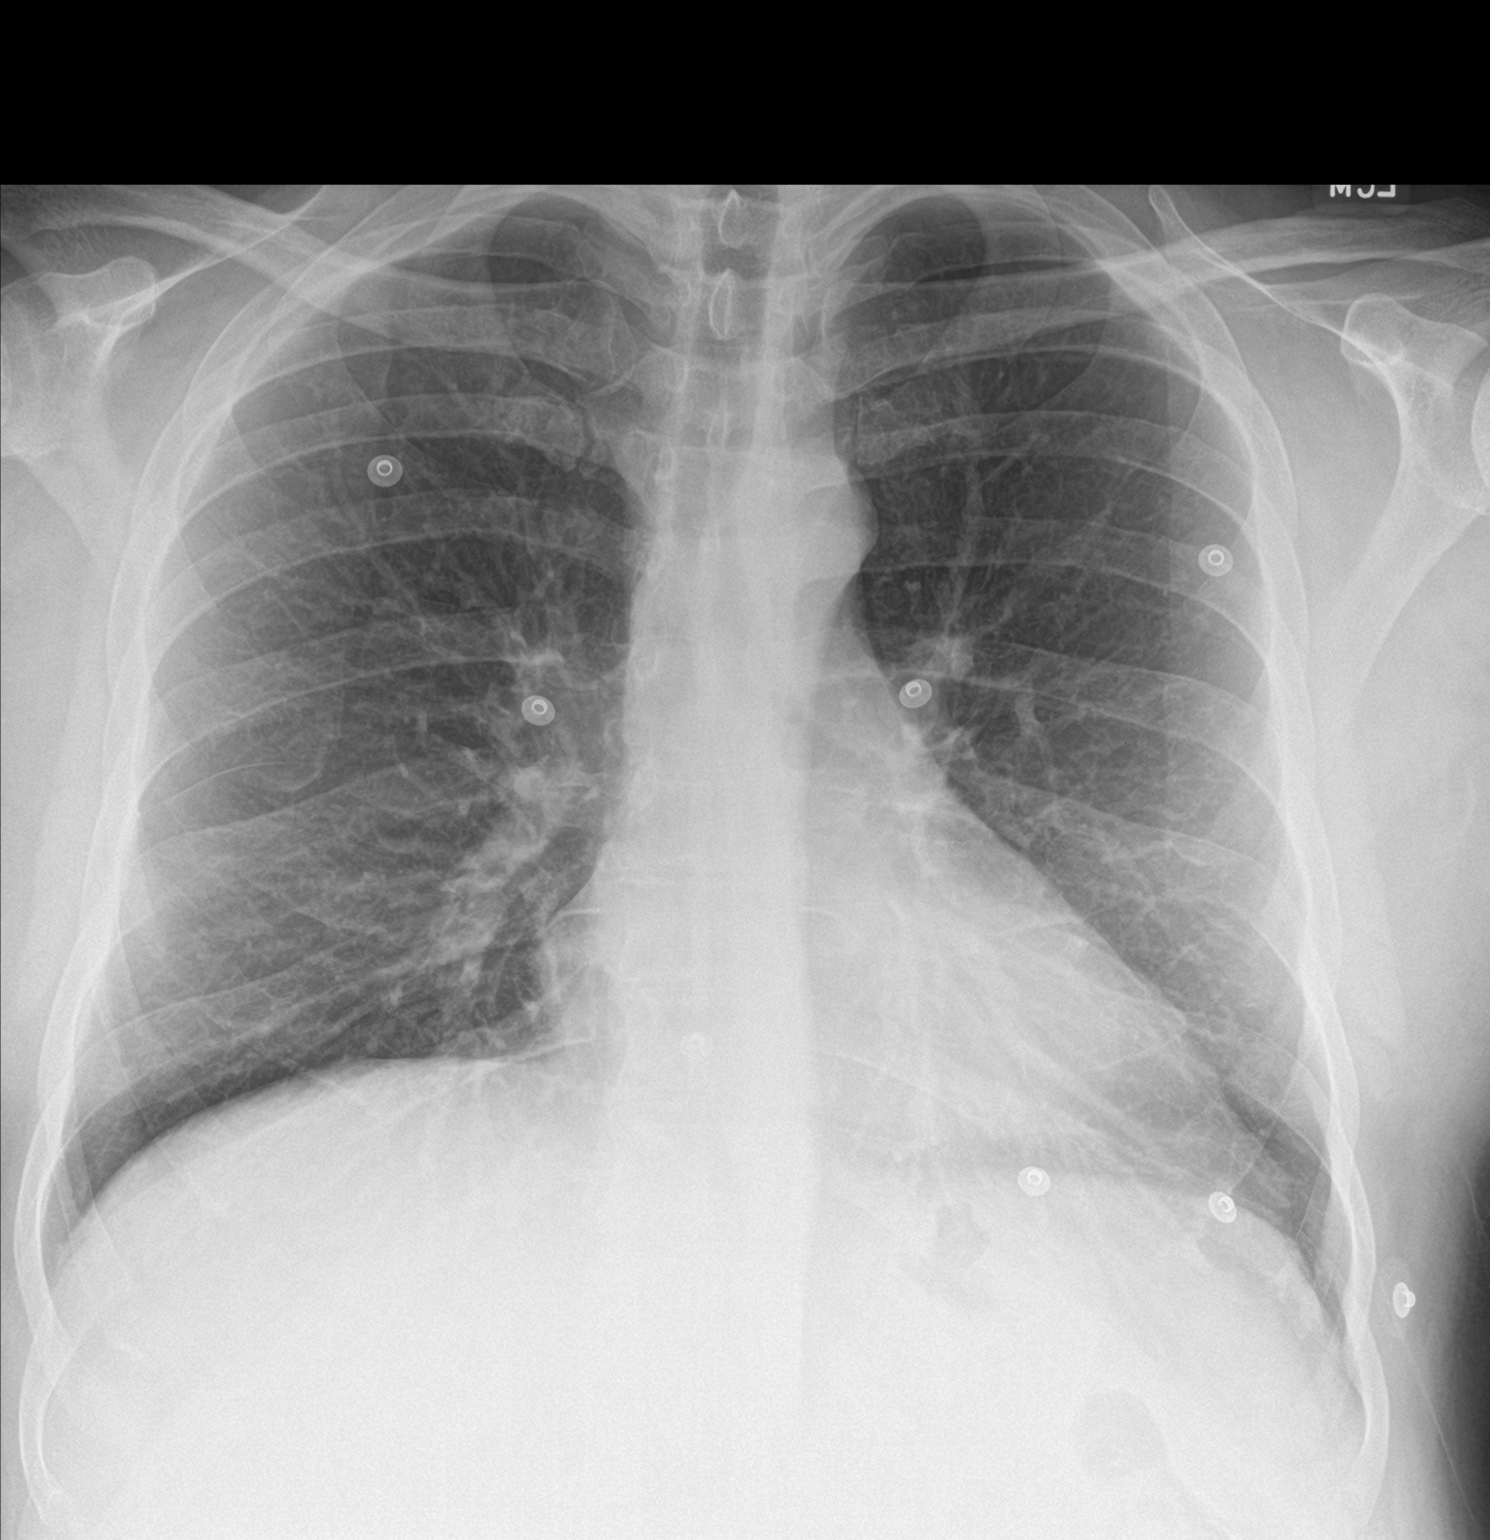

[chest lat]
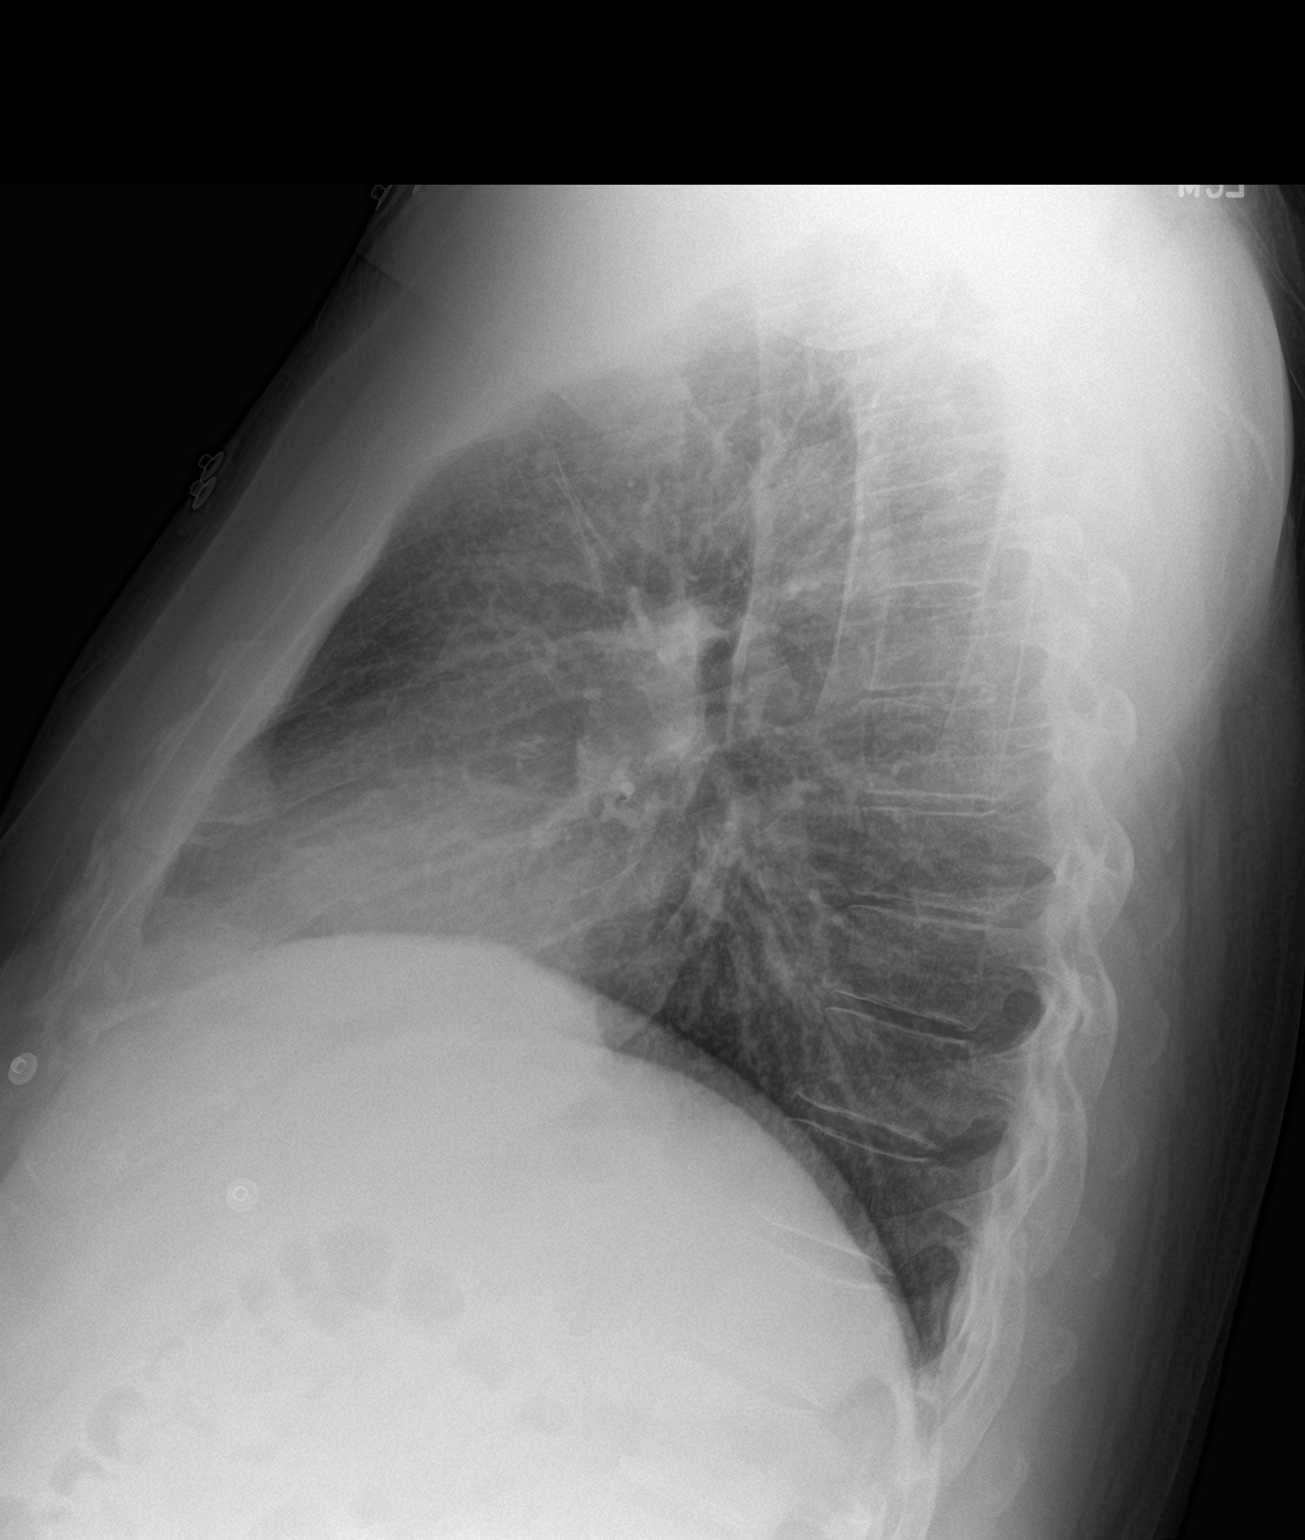

[2 of 2 positions shown; findings below may reference images not displayed]

FINDINGS: The heart size and mediastinal contours are within normal limits. No
focal consolidation. No pleural effusion. No pneumothorax. The
visualized skeletal structures are unremarkable.
IMPRESSION: No active cardiopulmonary disease.

## 2021-03-26 MED ORDER — CARVEDILOL 12.5 MG PO TABS
25.0000 mg | ORAL_TABLET | Freq: Two times a day (BID) | ORAL | Status: DC
  Filled 2021-03-26: qty 2

## 2021-03-26 MED ORDER — CARVEDILOL 12.5 MG PO TABS
25.0000 mg | ORAL_TABLET | Freq: Once | ORAL | Status: AC
  Administered 2021-03-26: 25 mg via ORAL

## 2021-03-26 MED ORDER — OXYCODONE-ACETAMINOPHEN 5-325 MG PO TABS
1.0000 | ORAL_TABLET | Freq: Once | ORAL | Status: AC
  Administered 2021-03-26: 1 via ORAL
  Filled 2021-03-26: qty 1

## 2021-03-26 MED ORDER — CLONAZEPAM 0.5 MG PO TABS
1.0000 mg | ORAL_TABLET | Freq: Once | ORAL | Status: AC
  Administered 2021-03-26: 1 mg via ORAL
  Filled 2021-03-26: qty 2

## 2021-03-26 NOTE — ED Triage Notes (Signed)
The pt arrived by [facility] from home  chest pain for one hour elevated bp  iv per ems  nitro x2 given by [facility] 324mg  aspirin stents in place January on blood thinner

## 2021-03-26 NOTE — ED Provider Notes (Signed)
[facility] EMERGENCY DEPARTMENT Provider Note   CSN: [id number] Arrival date & time: 03/26/21  1754     History Chief Complaint  Patient presents with   Chest Pain    Matthew [name] is a 45 y.o. male.  HPI Patient is a 45 year old male with a history of NSTEMI status post stenting, schizoaffective disorder, type 2 diabetes mellitus, thrombocytopenia, hypertension, who presents to the emergency department due to chest pain.  Patient states he was lying in bed and this pain began around 5 PM.  He states it was central and right-sided, stabbing, and nonradiating.  Reports associated shortness of breath.  No nausea, vomiting, or diaphoresis.  He called EMS who gave him 2 doses of nitroglycerin as well as 324 mg of aspirin.  He states that after the second dose of nitroglycerin his symptoms improved from an 8/10 to a 5/10.  States they are still currently a 5/10.  No modifying factors.  No abdominal pain.  Patient states he does not drink alcohol.  States he used to smoke 3 packs/day and now only uses a vaporizer.  He states that he is prescribed oxycodone as well as Klonopin which he takes daily.  Cardiac catheterization on October 7 with findings as noted below:  CONCLUSIONS: Normal left main Patent LAD stent with minimal luminal irregularities otherwise. 25% mid circumflex. Dominant right coronary with patent distal RCA stent.  Proximal tandem 25 and 45% stenoses.  Mild to moderate diffuse atherosclerosis noted in the right coronary. Anterior hypokinesis with EF 50%.  LVEDP 7 mmHg.    Past Medical History:  Diagnosis Date   Anxiety    Celiac disease    Coronary artery disease    Dairy allergy    Depression    Diabetes mellitus without complication ([place])    High cholesterol    Hypertension    Paranoia ([place])    Pre-diabetes    Schizoaffective disorder [facility])     Patient Active Problem List   Diagnosis Date Noted   Unstable angina ([place]) 02/24/2021    Right-sided chest pain 09/11/2020   Coronary artery disease of bypass graft of native heart with stable angina pectoris ([place])    Uncontrolled type 2 diabetes mellitus with hyperglycemia ([place])    Hyponatremia    Essential hypertension    History of hyperprolactinemia 08/16/2020   Mixed dyslipidemia 08/15/2020   Chest pain 06/06/2020   Type 2 diabetes mellitus with hyperglycemia ([place]) 06/06/2020   Hyperkalemia 06/06/2020   Thrombocytopenia ([place]) 06/06/2020   Obesity (BMI 30.0-34.9) 06/06/2020   Non-ST elevation (NSTEMI) myocardial infarction [facility])    Attention deficit hyperactivity disorder (ADHD), combined type, moderate 03/10/2018   Generalized anxiety disorder 03/10/2018   Schizoaffective disorder, bipolar type without good prognostic features ([place]) 02/09/2015   Morbid obesity ([place]) 01/18/2015   OSA (obstructive sleep apnea) 07/14/2014   Solitary pulmonary nodule 07/14/2014    Past Surgical History:  Procedure Laterality Date   APPENDECTOMY     BRAIN SURGERY     CARDIAC CATHETERIZATION     CORONARY STENT INTERVENTION N/A 06/06/2020   Procedure: CORONARY STENT INTERVENTION;  Surgeon: Martinique, Peter M, MD;  Location: [place] CV LAB;  Service: Cardiovascular;  Laterality: N/A;  prox LAD, distal RCA   INTRAVASCULAR ULTRASOUND/IVUS N/A 06/06/2020   Procedure: Intravascular Ultrasound/IVUS;  Surgeon: Martinique, Peter M, MD;  Location: [place] CV LAB;  Service: Cardiovascular;  Laterality: N/A;   LEFT HEART CATH AND CORONARY ANGIOGRAPHY N/A 06/06/2020   Procedure: LEFT  HEART CATH AND CORONARY ANGIOGRAPHY;  Surgeon: Martinique, Peter M, MD;  Location: [place] CV LAB;  Service: Cardiovascular;  Laterality: N/A;   LEFT HEART CATH AND CORONARY ANGIOGRAPHY N/A 02/24/2021   Procedure: LEFT HEART CATH AND CORONARY ANGIOGRAPHY;  Surgeon: Belva Crome, MD;  Location: [place] CV LAB;  Service: Cardiovascular;  Laterality: N/A;   NASAL SEPTUM SURGERY     WRIST FRACTURE SURGERY     right  wrist       Family History  Problem Relation Age of Onset   Asthma Mother    Hypertension Mother    Hypertension Father     Social History   Tobacco Use   Smoking status: Every Day    Packs/day: 1.00    Types: Cigarettes, E-cigarettes   Smokeless tobacco: Never   Tobacco comments:    patient given phone number to [phone] now on 08/16/20  Vaping Use   Vaping Use: Never used  Substance Use Topics   Alcohol use: No    Alcohol/week: 0.0 standard drinks    Comment: no (02/08/15)   Drug use: No    Home Medications Prior to Admission medications   Medication Sig Start Date End Date Taking? Authorizing Provider  aspirin EC 81 MG tablet Take 1 tablet (81 mg total) by mouth daily. Swallow whole. 06/07/20  Yes Freada Bergeron, MD  atorvastatin (LIPITOR) 80 MG tablet Take 1 tablet (80 mg total) by mouth every morning. 02/25/21  Yes Barrett, Evelene Croon, PA-C  carvedilol (COREG) 25 MG tablet Take 1 tablet (25 mg total) by mouth 2 (two) times daily. 10/10/20  Yes Freada Bergeron, MD  clonazePAM (KLONOPIN) 1 MG tablet Take 1 tablet (1 mg total) by mouth 3 (three) times daily as needed for anxiety. 02/04/21  Yes Hurst, Dorothea Glassman, PA-C  DULoxetine (CYMBALTA) 60 MG capsule Take 1 capsule (60 mg total) by mouth 2 (two) times daily. 03/06/21  Yes Hurst, Teresa T, PA-C  empagliflozin (JARDIANCE) 25 MG TABS tablet Take 1 tablet (25 mg total) by mouth daily before breakfast. 10/10/20  Yes Pemberton, Greer Ee, MD  insulin degludec (TRESIBA FLEXTOUCH) 100 UNIT/ML FlexTouch Pen Inject 20 Units into the skin daily. Patient taking differently: Inject 20 Units into the skin daily after breakfast. 11/16/20  Yes Shamleffer, Melanie Crazier, MD  lisinopril (ZESTRIL) 20 MG tablet Take 1 tablet (20 mg total) by mouth daily. 06/08/20  Yes Imogene Burn, PA-C  lithium carbonate (LITHOBID) 300 MG CR tablet TAKE 2 TABLETS (600 MG TOTAL) BY MOUTH AT BEDTIME. 02/28/21  Yes Hurst, Teresa T, PA-C  metFORMIN  (GLUCOPHAGE) 1000 MG tablet TAKE 1 TABLET (1,000 MG TOTAL) BY MOUTH 2 (TWO) TIMES DAILY WITH A MEAL. 01/02/21  Yes Shamleffer, Melanie Crazier, MD  nitroGLYCERIN (NITROSTAT) 0.4 MG SL tablet Place 1 tablet (0.4 mg total) under the tongue every 5 (five) minutes as needed for chest pain. 08/10/20 08/10/21 Yes Kathyrn Drown D, NP  oxyCODONE-acetaminophen (PERCOCET) 10-325 MG tablet Take 1 tablet by mouth 5 (five) times daily. 10/20/20  Yes [provider]  perphenazine (TRILAFON) 16 MG tablet TAKE 1 PILL TOTAL 16 MG EVERY MORNING AND TAKE 2 PILLS BY MOUTH TOTAL 32 MG EVERY BEDTIME Patient taking differently: Take 32 mg by mouth at bedtime. 10/07/20  Yes Donnal Moat T, PA-C  pregabalin (LYRICA) 50 MG capsule Take 50 mg by mouth 2 (two) times daily. 02/28/21  Yes [provider]  pseudoephedrine (SUDAFED) 120 MG 12 hr tablet Take 120 mg  by mouth every 12 (twelve) hours as needed for congestion.   Yes [provider]  ticagrelor (BRILINTA) 90 MG TABS tablet Take 1 tablet (90 mg total) by mouth 2 (two) times daily. 06/07/20  Yes Meriam Sprague, MD  traZODone (DESYREL) 100 MG tablet TAKE 3 TABLETS BY MOUTH EVERY DAY AT BEDTIME AS NEEDED FOR SLEEP Patient taking differently: Take 300 mg by mouth at bedtime. 03/06/21  Yes Melony Overly T, PA-C  Vitamin D, Ergocalciferol, (DRISDOL) 1.25 MG (50000 UNIT) CAPS capsule Take 50,000 Units by mouth every Sunday. 11/28/20  Yes [provider]  zolpidem (AMBIEN) 10 MG tablet Take 0.5-1 tablets (5-10 mg total) by mouth at bedtime as needed for sleep. Patient taking differently: Take 10 mg by mouth at bedtime. 03/06/21 04/05/21 Yes Hurst, Rosey Bath T, PA-C  gabapentin (NEURONTIN) 800 MG tablet 1 po q am, 1/2 po at noon, 1 po at 4 pm, and 1 po at bedtime. Patient not taking: No sig reported 12/07/20   Melony Overly T, PA-C  icosapent Ethyl (VASCEPA) 1 g capsule Take 2 capsules (2 g total) by mouth 2 (two) times daily. Patient not taking: No  sig reported 01/20/21   Supple, Megan E, RPH-CPP  Insulin Pen Needle 32G X 4 MM MISC 1 Device by Does not apply route daily. 08/15/20   Shamleffer, Konrad Dolores, MD  isosorbide mononitrate (IMDUR) 30 MG 24 hr tablet Take 1 tablet (30 mg total) by mouth daily. Patient not taking: No sig reported 02/26/21   Barrett, Joline Salt, PA-C  nicotine (NICODERM CQ - DOSED IN MG/24 HOURS) 21 mg/24hr patch Place 1 patch (21 mg total) onto the skin daily. Patient not taking: No sig reported 02/26/21   Barrett, Joline Salt, PA-C  spironolactone (ALDACTONE) 25 MG tablet Take 0.5 tablets (12.5 mg total) by mouth daily. Patient not taking: No sig reported 07/20/20 07/15/21  Meriam Sprague, MD    Allergies    Gluten meal and Lactose intolerance (gi)  Review of Systems   Review of Systems  All other systems reviewed and are negative. Ten systems reviewed and are negative for acute change, except as noted in the HPI.   Physical Exam Updated Vital Signs BP (!) 142/103   Pulse 99   Temp 98.4 F (36.9 C)   Resp (!) 22   Ht 6\' 2"  (1.88 m)   Wt 123.6 kg   SpO2 95%   BMI 34.99 kg/m   Physical Exam Vitals and nursing note reviewed.  Constitutional:      General: He is not in acute distress.    Appearance: Normal appearance. He is not ill-appearing, toxic-appearing or diaphoretic.  HENT:     Head: Normocephalic and atraumatic.     Right Ear: External ear normal.     Left Ear: External ear normal.     Nose: Nose normal.     Mouth/Throat:     Mouth: Mucous membranes are moist.     Pharynx: Oropharynx is clear. No oropharyngeal exudate or posterior oropharyngeal erythema.  Eyes:     Extraocular Movements: Extraocular movements intact.  Cardiovascular:     Rate and Rhythm: Normal rate and regular rhythm.     Pulses: Normal pulses.          Radial pulses are 2+ on the right side and 2+ on the left side.       Dorsalis pedis pulses are 2+ on the right side and 2+ on the left side.     Heart  sounds: Normal  heart sounds. Heart sounds not distant. No murmur heard. No systolic murmur is present.  No diastolic murmur is present.    No friction rub. No gallop. No S3 or S4 sounds.     Comments: RRR without M/R/G. Pulmonary:     Effort: Pulmonary effort is normal. No tachypnea, accessory muscle usage or respiratory distress.     Breath sounds: Normal breath sounds. No stridor. No decreased breath sounds, wheezing, rhonchi or rales.  Abdominal:     General: Abdomen is flat.     Palpations: Abdomen is soft.     Tenderness: There is no abdominal tenderness.     Comments: Protuberant abdomen that is soft and nontender.  Musculoskeletal:        General: Normal range of motion.     Cervical back: Normal range of motion and neck supple. No tenderness.     Right lower leg: No edema.     Left lower leg: No edema.  Skin:    General: Skin is warm and dry.  Neurological:     General: No focal deficit present.     Mental Status: He is alert and oriented to person, place, and time.  Psychiatric:        Mood and Affect: Mood normal.        Behavior: Behavior normal.   ED Results / Procedures / Treatments   Labs (all labs ordered are listed, but only abnormal results are displayed) Labs Reviewed  BASIC METABOLIC PANEL - Abnormal; Notable for the following components:      Result Value   CO2 21 (*)    Glucose, Bld 196 (*)    Calcium 10.4 (*)    All other components within normal limits  CBC - Abnormal; Notable for the following components:   WBC 14.4 (*)    All other components within normal limits  RAPID URINE DRUG SCREEN, HOSP PERFORMED  TROPONIN I (HIGH SENSITIVITY)  TROPONIN I (HIGH SENSITIVITY)   EKG EKG Interpretation  Date/Time:  Sunday March 26 2021 18:13:20 EST Ventricular Rate:  113 PR Interval:  194 QRS Duration: 104 QT Interval:  316 QTC Calculation: 433 R Axis:   174 Text Interpretation: Sinus tachycardia Incomplete right bundle branch block Right ventricular hypertrophy  Inferior infarct , age undetermined Possible Anterolateral infarct , age undetermined Abnormal ECG Confirmed by Dene Gentry [phone]) on 03/26/2021 7:54:28 PM  Radiology DG Chest 2 View  Result Date: 03/26/2021 CLINICAL DATA:  Right-sided chest pain that radiates to the back. EXAM: CHEST - 2 VIEW COMPARISON:  Chest radiograph February 24, 2021 FINDINGS: The heart size and mediastinal contours are within normal limits. No focal consolidation. No pleural effusion. No pneumothorax. The visualized skeletal structures are unremarkable. IMPRESSION: No active cardiopulmonary disease. Electronically Signed   By: Dahlia Bailiff M.D.   On: 03/26/2021 19:15    Procedures Procedures   Medications Ordered in ED Medications  clonazePAM (KLONOPIN) tablet 1 mg (1 mg Oral Given 03/26/21 2032)  carvedilol (COREG) tablet 25 mg (25 mg Oral Given 03/26/21 2113)  oxyCODONE-acetaminophen (PERCOCET/ROXICET) 5-325 MG per tablet 1 tablet (1 tablet Oral Given 03/26/21 2225)   ED Course  I have reviewed the triage vital signs and the nursing notes.  Pertinent labs & imaging results that were available during my care of the patient were reviewed by me and considered in my medical decision making (see chart for details).  Clinical Course as of 03/26/21 2232  Nancy Fetter Mar 26, 2021  2106 Patient  persistently hypertensive.  States he has not taken his evening dose of carvedilol.  Will order this. [LJ]  2159 Patient discussed with Dr. Juliane Lack who is on-call for cardiology.  Given patient's reassuring troponins, chest x-ray, and ECG today doubt ACS at this time.  Patient also noted to have a reassuring cardiac catheterization 1 month ago.  Feel that it is reasonable to have him follow-up outpatient with Dr. Johney Frame. [LJ]    Clinical Course User Index [LJ] Rayna Sexton, PA-C   MDM Rules/Calculators/A&P                          Pt is a 45 y.o. male who presents to the emergency department due to chest pain and shortness of  breath that began around 5 PM today.  Labs: CBC with a white count of 14.4. BMP with a CO2 21, glucose of 196, calcium of 10.4. UDS is negative. Troponin of 4 with a repeat of 3.  Imaging: Chest x-ray shows no active cardiopulmonary disease.  I, Rayna Sexton, PA-C, personally reviewed and evaluated these images and lab results as part of my medical decision-making.  Patient with a reassuring chest x-ray as well as troponins.  Seen 1 month ago with similar symptoms and had a repeat cardiac catheterization with findings as noted above that were reassuring.  Patient discussed with Dr. Juliane Lack with cardiology.  They recommend outpatient follow-up with his cardiologist, Dr. Johney Frame.  Feel the patient is stable for discharge at this time.  Discussed strict return precautions with the patient.  Recommended that he follow-up with cardiology tomorrow.  He verbalized understanding of the above plan.  Urged him to make sure that he is taking all of his blood pressure medications as prescribed.  He was given his evening dose of carvedilol.  His questions were answered and he was amicable at the time of discharge.  Note: Portions of this report may have been transcribed using voice recognition software. Every effort was made to ensure accuracy; however, inadvertent computerized transcription errors may be present.   Final Clinical Impression(s) / ED Diagnoses Final diagnoses:  Chest pain, unspecified type   Rx / DC Orders ED Discharge Orders     None        Rayna Sexton, PA-C 03/26/21 2235    Valarie Merino, MD 03/26/21 2316

## 2021-03-26 NOTE — ED Notes (Signed)
This RN provided pt with the phone numbers for 2 taxi services as his ride home.

## 2021-03-26 NOTE — ED Notes (Signed)
Pt c/o worsening chest pain. EDP made aware

## 2021-03-26 NOTE — ED Notes (Signed)
RN informed pt of need for urine sample. Ice water and diet shasta provided to pt.

## 2021-03-26 NOTE — ED Notes (Signed)
E-signature pad unavailable at time of pt discharge. This RN discussed discharge materials with pt and answered all pt questions. Pt stated understanding of discharge material. ? ?

## 2021-03-26 NOTE — ED Provider Notes (Signed)
Emergency Medicine Provider Triage Evaluation Note  Matthew [name] , a 45 y.o. male  was evaluated in triage.  Pt complains of sternal crushing chest pain that began 2hours PTA. Hx of ACS with 2 stents placed in LAD in January. ReCath one month ago with no new or significant findings. HTN, DM2, HLD, compliant with medication. Patient was lying in bed when pain began but reports worse with exertion. Feels lightheaded, some radiation to back. Feels like arms and legs are heavy / cold. No NV. Does endorse some SOB. Some improvement with nitro / ASA in route.  Review of Systems  Positive: As above Negative: As above  Physical Exam  BP (!) 155/92   Pulse (!) 114   Temp 98.4 F (36.9 C)   Resp 16   Ht 6\' 2"  (1.88 m)   Wt 123.6 kg   SpO2 97%   BMI 34.99 kg/m  Gen:   Awake, no distress   Resp:  Normal effort  MSK:   Moves extremities without difficulty  Other:  Tachycardia. Pulses present in all 4 extremities.  Medical Decision Making  Medically screening exam initiated at 6:13 PM.  Appropriate orders placed.  STANISLAUS KALTENBACH was informed that the remainder of the evaluation will be completed by another provider, this initial triage assessment does not replace that evaluation, and the importance of remaining in the ED until their evaluation is complete.  Concern for ACS. EKG obtained at arrival, ACS lab orders initiated.   [name], PA-C 03/26/21 1816    13/06/22, MD 03/26/21 2035

## 2021-03-26 NOTE — Discharge Instructions (Addendum)
Please make sure you are taking all of your blood pressure medications.  Please follow-up with your cardiologist, Dr. Shari Prows, tomorrow.  If you develop any new or worsening symptoms please come back to the emergency department

## 2021-03-26 NOTE — ED Notes (Signed)
This RN found pt using his vape in his room. This RN informed pt that vaping is not allowed in the hospital.

## 2021-03-27 ENCOUNTER — Other Ambulatory Visit: Payer: Self-pay | Admitting: Physician Assistant

## 2021-03-27 DIAGNOSIS — F250 Schizoaffective disorder, bipolar type: Secondary | ICD-10-CM

## 2021-03-27 DIAGNOSIS — Z79899 Other long term (current) drug therapy: Secondary | ICD-10-CM

## 2021-03-27 MED ORDER — LITHIUM CARBONATE ER 300 MG PO TBCR: 900.0000 mg | EXTENDED_RELEASE_TABLET | Freq: Every day | ORAL | 0 refills | Status: DC

## 2021-03-27 NOTE — Progress Notes (Signed)
Patient said he was taking lithium as prescribed. Instructions on increase provided, as well as lab instructions.

## 2021-03-27 NOTE — Progress Notes (Signed)
I ordered the lithium level to be done through [facility].  Order should have printed up front.  Please mail it to him. Thank you

## 2021-03-27 NOTE — Progress Notes (Signed)
Please check and see if he is taking the lithium as directed.  If he is, then I will need to increase the dose to 3 pills (900 mg) nightly.  If he is not then restarted by taking the lithium CR 300 mg, 1 p.o. nightly for 3 nights and then increase to 2 p.o. nightly. Either way he needs to have another lithium level drawn in 5 to 7 days after the increase. Thank you.

## 2021-03-29 ENCOUNTER — Telehealth: Payer: Self-pay | Admitting: Physician Assistant

## 2021-03-29 NOTE — Telephone Encounter (Signed)
Mailed lab orders to Pt °

## 2021-03-29 NOTE — Progress Notes (Signed)
Put in mail

## 2021-03-30 ENCOUNTER — Other Ambulatory Visit: Payer: Self-pay | Admitting: Physician Assistant

## 2021-03-30 NOTE — Telephone Encounter (Signed)
Last filled 10/13

## 2021-04-05 ENCOUNTER — Other Ambulatory Visit: Payer: Self-pay | Admitting: Physician Assistant

## 2021-04-05 DIAGNOSIS — Z79899 Other long term (current) drug therapy: Secondary | ICD-10-CM

## 2021-04-05 DIAGNOSIS — F250 Schizoaffective disorder, bipolar type: Secondary | ICD-10-CM

## 2021-04-05 LAB — LITHIUM LEVEL: Lithium Lvl: 0.5 mmol/L (ref 0.5–1.2)

## 2021-04-05 NOTE — Progress Notes (Signed)
Please see how he is taking the lithium now.  Ask about compliance.  If he is diligent about taking the lithium as directed, have him increase by 1 pill.  So if he is taking 2 then go up to 3, if he is taking 3 then go up to 4.  I will order another lithium level to be drawn in 5-7  days, as close to 12 hours after the last dose was taken. Please mail him the lab order. Thank you.

## 2021-04-05 NOTE — Progress Notes (Signed)
Patient stated he was very compliant, was taking every night before bed. He is currently taking 3 tablets, asked him to go to 4 tablets. Told him lab order will be mailed to him and it was put in in box to be mailed. Asked that he get lab drawn as close to 12 hours after last dose as requested.

## 2021-04-05 NOTE — Progress Notes (Signed)
LVM to rtc 

## 2021-04-09 ENCOUNTER — Other Ambulatory Visit: Payer: Self-pay | Admitting: Physician Assistant

## 2021-04-10 ENCOUNTER — Other Ambulatory Visit: Payer: Self-pay

## 2021-04-10 MED ORDER — ATORVASTATIN CALCIUM 80 MG PO TABS: 80.0000 mg | ORAL_TABLET | Freq: Every morning | ORAL | 2 refills | Status: DC

## 2021-04-17 ENCOUNTER — Encounter: Payer: Self-pay | Admitting: Physician Assistant

## 2021-04-17 ENCOUNTER — Other Ambulatory Visit: Payer: Self-pay

## 2021-04-17 ENCOUNTER — Ambulatory Visit (INDEPENDENT_AMBULATORY_CARE_PROVIDER_SITE_OTHER): Payer: Medicaid Other | Admitting: Physician Assistant

## 2021-04-17 DIAGNOSIS — F5105 Insomnia due to other mental disorder: Secondary | ICD-10-CM | POA: Diagnosis not present

## 2021-04-17 DIAGNOSIS — Z79899 Other long term (current) drug therapy: Secondary | ICD-10-CM

## 2021-04-17 DIAGNOSIS — F902 Attention-deficit hyperactivity disorder, combined type: Secondary | ICD-10-CM | POA: Diagnosis not present

## 2021-04-17 DIAGNOSIS — F250 Schizoaffective disorder, bipolar type: Secondary | ICD-10-CM

## 2021-04-17 DIAGNOSIS — F411 Generalized anxiety disorder: Secondary | ICD-10-CM

## 2021-04-17 DIAGNOSIS — F99 Mental disorder, not otherwise specified: Secondary | ICD-10-CM

## 2021-04-17 DIAGNOSIS — F17200 Nicotine dependence, unspecified, uncomplicated: Secondary | ICD-10-CM

## 2021-04-17 DIAGNOSIS — G4733 Obstructive sleep apnea (adult) (pediatric): Secondary | ICD-10-CM

## 2021-04-17 MED ORDER — ZALEPLON 10 MG PO CAPS: ORAL_CAPSULE | ORAL | 1 refills | Status: DC

## 2021-04-17 MED ORDER — CLONAZEPAM 1 MG PO TABS: 1.0000 mg | ORAL_TABLET | Freq: Three times a day (TID) | ORAL | 2 refills | Status: DC | PRN

## 2021-04-17 NOTE — Progress Notes (Signed)
[facility]  Patient ID: Matthew [name],  MRN: [id number]  PCP: [name], MD  Date of Evaluation: 04/17/2021  time spent:40 minutes  Chief Complaint:  Chief Complaint   Anxiety; Depression; Insomnia; Follow-up     HISTORY/CURRENT STATUS: HPI For routine f/u.   At the last visit Ambien was prescribed.  It was not effective at all.  He has gone back to using trazodone but he still wakes up during the middle of the night.  Rarely has a good night's sleep.  Has known sleep apnea and used a CPAP machine several years ago but it is no longer working.  His cardiologist will be ordering a sleep study soon.  As far as his mood goes he is doing well.  Energy and motivation are low just because he is tired from not resting well.  Able to enjoy things sometimes.  ADLs are normal.  Personal hygiene normal.  Does not cry easily.  Anxiety is still an issue but the Klonopin does help.  Lithium dose was increased since the last visit, he has not had follow-up labs since though.  Denies suicidal or homicidal thoughts.  Patient denies increased energy with decreased need for sleep, no increased talkativeness, no racing thoughts, no impulsivity or risky behaviors, no increased spending, no increased libido, no grandiosity, no paranoia, and no hallucinations.  Denies dizziness, syncope, seizures, numbness, tingling, tremor, tics, unsteady gait, slurred speech, confusion. Denies muscle or joint pain, stiffness, or dystonia.  Individual Medical History/ Review of Systems: Changes? :Yes   had a chest cold last week but is better now.  Past medications for mental health diagnoses include: Risperdal, Zyprexa, Abilify, [name], Klonopin, Gabapentin, Xanax, Valium, Ativan, Hydroxyzine, Trilafon, Atenolol, Cymbalta, Prozac, Lexapro, Paxil, Lithium, Adderall, Ritalin, Mirtazepine  Allergies: Gluten meal and Lactose intolerance (gi)  Current Medications:  Current Outpatient Medications:     aspirin EC 81 MG tablet, Take 1 tablet (81 mg total) by mouth daily. Swallow whole., Disp: 90 tablet, Rfl: 3   atorvastatin (LIPITOR) 80 MG tablet, Take 1 tablet (80 mg total) by mouth every morning., Disp: 90 tablet, Rfl: 2   carvedilol (COREG) 25 MG tablet, Take 1 tablet (25 mg total) by mouth 2 (two) times daily., Disp: 180 tablet, Rfl: 3   DULoxetine (CYMBALTA) 60 MG capsule, Take 1 capsule (60 mg total) by mouth 2 (two) times daily., Disp: 180 capsule, Rfl: 0   empagliflozin (JARDIANCE) 25 MG TABS tablet, Take 1 tablet (25 mg total) by mouth daily before breakfast., Disp: 90 tablet, Rfl: 2   insulin degludec (TRESIBA FLEXTOUCH) 100 UNIT/ML FlexTouch Pen, Inject 20 Units into the skin daily. (Patient taking differently: Inject 20 Units into the skin daily after breakfast.), Disp: 15 mL, Rfl: 11   Insulin Pen Needle 32G X 4 MM MISC, 1 Device by Does not apply route daily., Disp: 150 each, Rfl: 3   lisinopril (ZESTRIL) 20 MG tablet, Take 1 tablet (20 mg total) by mouth daily., Disp: 90 tablet, Rfl: 3   lithium carbonate (LITHOBID) 300 MG CR tablet, Take 3 tablets (900 mg total) by mouth at bedtime. (Patient taking differently: Take 1,200 mg by mouth at bedtime.), Disp: 270 tablet, Rfl: 0   metFORMIN (GLUCOPHAGE) 1000 MG tablet, TAKE 1 TABLET (1,000 MG TOTAL) BY MOUTH 2 (TWO) TIMES DAILY WITH A MEAL., Disp: 180 tablet, Rfl: 3   nitroGLYCERIN (NITROSTAT) 0.4 MG SL tablet, Place 1 tablet (0.4 mg total) under the tongue every 5 (five) minutes as needed for  chest pain., Disp: 100 tablet, Rfl: 3   oxyCODONE-acetaminophen (PERCOCET) 10-325 MG tablet, Take 1 tablet by mouth 5 (five) times daily., Disp: , Rfl:    perphenazine (TRILAFON) 16 MG tablet, TAKE 1 PILL TOTAL 16 MG EVERY MORNING AND TAKE 2 PILLS BY MOUTH TOTAL 32 MG EVERY BEDTIME (Patient taking differently: Take 32 mg by mouth at bedtime.), Disp: 270 tablet, Rfl: 0   pregabalin (LYRICA) 50 MG capsule, Take 50 mg by mouth 2 (two) times daily., Disp: ,  Rfl:    ticagrelor (BRILINTA) 90 MG TABS tablet, Take 1 tablet (90 mg total) by mouth 2 (two) times daily., Disp: 60 tablet, Rfl: 11   traZODone (DESYREL) 100 MG tablet, TAKE 3 TABLETS BY MOUTH EVERY DAY AT BEDTIME AS NEEDED FOR SLEEP (Patient taking differently: Take 300 mg by mouth at bedtime.), Disp: 270 tablet, Rfl: 0   Vitamin D, Ergocalciferol, (DRISDOL) 1.25 MG (50000 UNIT) CAPS capsule, Take 50,000 Units by mouth every Sunday., Disp: , Rfl:    zaleplon (SONATA) 10 MG capsule, 1 p.o. nightly as needed sleep.  May repeat 1 p.o. for mid nocturnal awakening as long as he has 3 hours or more left to sleep., Disp: 60 capsule, Rfl: 1   clonazePAM (KLONOPIN) 1 MG tablet, Take 1 tablet (1 mg total) by mouth 3 (three) times daily as needed for anxiety., Disp: 90 tablet, Rfl: 2   gabapentin (NEURONTIN) 800 MG tablet, 1 po q am, 1/2 po at noon, 1 po at 4 pm, and 1 po at bedtime. (Patient not taking: Reported on 03/26/2021), Disp: 315 tablet, Rfl: 1   icosapent Ethyl (VASCEPA) 1 g capsule, Take 2 capsules (2 g total) by mouth 2 (two) times daily. (Patient not taking: Reported on 02/24/2021), Disp: 120 capsule, Rfl: 6   isosorbide mononitrate (IMDUR) 30 MG 24 hr tablet, Take 1 tablet (30 mg total) by mouth daily. (Patient not taking: Reported on 03/06/2021), Disp: 30 tablet, Rfl: 6   nicotine (NICODERM CQ - DOSED IN MG/24 HOURS) 21 mg/24hr patch, Place 1 patch (21 mg total) onto the skin daily. (Patient not taking: Reported on 03/06/2021), Disp: 14 patch, Rfl: 0   pseudoephedrine (SUDAFED) 120 MG 12 hr tablet, Take 120 mg by mouth every 12 (twelve) hours as needed for congestion. (Patient not taking: Reported on 04/17/2021), Disp: , Rfl:    spironolactone (ALDACTONE) 25 MG tablet, Take 0.5 tablets (12.5 mg total) by mouth daily. (Patient not taking: Reported on 02/24/2021), Disp: 45 tablet, Rfl: 3 Medication Side Effects: none  Family Medical/ Social History: Changes? no   MENTAL HEALTH EXAM:  There were  no vitals taken for this visit.There is no height or weight on file to calculate BMI.  General Appearance: Casual, Well Groomed, and Obese  Eye Contact:  Good  Speech:  Clear and Coherent and Normal Rate  Volume:  Normal  Mood:  Euthymic  Affect:  Appropriate  Thought Process:  Goal Directed and Descriptions of Associations: Circumstantial  Orientation:  Full (Time, Place, and Person)  Thought Content: Logical   Suicidal Thoughts:  No  Homicidal Thoughts:  No  Memory:  WNL  Judgement:  Good  Insight:  Fair  Psychomotor Activity:   Has occasional mandibular side-to-side movement.  Not sure if this is new or not because he has always been masked in the office.  Unsure if it is tardive dyskinesia or just a habit but will watch. There are no other abnormal movements.  Concentration:  Concentration: Good and Attention  Span: Fair  Recall:  Good  Fund of Knowledge: Good  Language: NA  Assets:  Desire for Improvement  ADL's:  Intact  Cognition: WNL  Prognosis:  Fair   04/04/2021  Lithium 0.5   DIAGNOSES:    ICD-10-CM   1. Insomnia due to other mental disorder  F51.05    F99     2. Schizoaffective disorder, bipolar type without good prognostic features (HCC)  F25.0 Lithium level    3. Generalized anxiety disorder  F41.1     4. Attention deficit hyperactivity disorder (ADHD), combined type, moderate  F90.2     5. Encounter for long-term (current) use of medications  Z79.899 Lithium level    6. OSA (obstructive sleep apnea)  G47.33     7. Smoker  F17.200        Receiving Psychotherapy: No    RECOMMENDATIONS:  PDMP reviewed.  Last Klonopin filled 04/01/2021.  Lyrica and oxycodone known to me. I provided 40 minutes of face to face time during this encounter, including time spent before and after the visit in records review, medical decision making, counseling pertinent to today's visit, and charting.  We again discussed sleep hygiene.  I think it is a great idea for him to  have a another sleep study.  He will see his cardiologist in December and will discuss with them then. Since he is having a lot of problems with mid nocturnal awakening, recommend Sonata.  Pros and cons were discussed, side effects discussed and he accepts and would like to try it. Smoking cessation discussed.  He is currently trying to wean off cigarettes as he is also vaping.   I did not discuss the observed mandibular movement with him.  I will continue to watch. Start Sonata 10 mg, 1 p.o. nightly and may repeat 1 for mid nocturnal awakening as long as he has 3 hours left to sleep. Continue Klonopin 1 mg, 1 p.o. 3 times daily. Continue Cymbalta 60 mg, 1 p.o. twice daily. Continue lithium CR 300 mg, 2 p.o. twice daily.  He will not make this change (currently taking 4 p.o. at night) until after he has labs drawn this week.  If level is still low, I will not increase the dose as he seems to be doing well right now. Continue perphenazine 16 mg, 1 po every morning and 2 p.o. nightly.  Lithium level ordered to be drawn sometime this week Needs to be in counseling. Return in 2 months.  Melony Overly, PA-C

## 2021-04-19 LAB — LITHIUM LEVEL: Lithium Lvl: 0.4 mmol/L — ABNORMAL LOW (ref 0.5–1.2)

## 2021-04-19 NOTE — Progress Notes (Signed)
Please let him know if the lithium level is still low but we agreed to leave the dose the same since he is doing well.  If he is having increased depression at any point before the next visit he should call and we will increase the lithium dose. He should go ahead and take the lithium to  2 pills twice a day as we had discussed at the last visit.  Thank you.

## 2021-04-19 NOTE — Progress Notes (Signed)
Patient notified and expressed understanding.

## 2021-04-20 ENCOUNTER — Other Ambulatory Visit: Payer: Self-pay | Admitting: Physician Assistant

## 2021-04-20 DIAGNOSIS — F250 Schizoaffective disorder, bipolar type: Secondary | ICD-10-CM

## 2021-04-21 ENCOUNTER — Emergency Department (HOSPITAL_COMMUNITY): Payer: Medicaid Other

## 2021-04-21 ENCOUNTER — Encounter (HOSPITAL_COMMUNITY): Payer: Self-pay | Admitting: Emergency Medicine

## 2021-04-21 ENCOUNTER — Emergency Department (HOSPITAL_COMMUNITY)
Admission: EM | Admit: 2021-04-21 | Discharge: 2021-04-22 | Disposition: A | Discharge status: Home / Self Care | Payer: Medicaid Other | Attending: Emergency Medicine | Admitting: Emergency Medicine

## 2021-04-21 ENCOUNTER — Other Ambulatory Visit: Payer: Self-pay

## 2021-04-21 ENCOUNTER — Ambulatory Visit (HOSPITAL_BASED_OUTPATIENT_CLINIC_OR_DEPARTMENT_OTHER): Payer: Medicaid Other | Admitting: Family

## 2021-04-21 DIAGNOSIS — R600 Localized edema: Secondary | ICD-10-CM | POA: Insufficient documentation

## 2021-04-21 DIAGNOSIS — E119 Type 2 diabetes mellitus without complications: Secondary | ICD-10-CM | POA: Insufficient documentation

## 2021-04-21 DIAGNOSIS — F17210 Nicotine dependence, cigarettes, uncomplicated: Secondary | ICD-10-CM | POA: Diagnosis not present

## 2021-04-21 DIAGNOSIS — R000 Tachycardia, unspecified: Secondary | ICD-10-CM | POA: Diagnosis not present

## 2021-04-21 DIAGNOSIS — R079 Chest pain, unspecified: Secondary | ICD-10-CM | POA: Diagnosis present

## 2021-04-21 DIAGNOSIS — Z7982 Long term (current) use of aspirin: Secondary | ICD-10-CM | POA: Insufficient documentation

## 2021-04-21 DIAGNOSIS — R0602 Shortness of breath: Secondary | ICD-10-CM | POA: Diagnosis not present

## 2021-04-21 DIAGNOSIS — I10 Essential (primary) hypertension: Secondary | ICD-10-CM | POA: Diagnosis not present

## 2021-04-21 DIAGNOSIS — I25119 Atherosclerotic heart disease of native coronary artery with unspecified angina pectoris: Secondary | ICD-10-CM | POA: Diagnosis not present

## 2021-04-21 DIAGNOSIS — R053 Chronic cough: Secondary | ICD-10-CM | POA: Diagnosis not present

## 2021-04-21 DIAGNOSIS — Z7984 Long term (current) use of oral hypoglycemic drugs: Secondary | ICD-10-CM | POA: Insufficient documentation

## 2021-04-21 DIAGNOSIS — Z79899 Other long term (current) drug therapy: Secondary | ICD-10-CM | POA: Diagnosis not present

## 2021-04-21 DIAGNOSIS — Z20822 Contact with and (suspected) exposure to covid-19: Secondary | ICD-10-CM | POA: Insufficient documentation

## 2021-04-21 DIAGNOSIS — Z794 Long term (current) use of insulin: Secondary | ICD-10-CM | POA: Diagnosis not present

## 2021-04-21 DIAGNOSIS — Z955 Presence of coronary angioplasty implant and graft: Secondary | ICD-10-CM | POA: Insufficient documentation

## 2021-04-21 LAB — BASIC METABOLIC PANEL
Anion gap: 13 (ref 5–15)
BUN: 8 mg/dL (ref 6–20)
CO2: 25 mmol/L (ref 22–32)
Calcium: 10.8 mg/dL — ABNORMAL HIGH (ref 8.9–10.3)
Chloride: 95 mmol/L — ABNORMAL LOW (ref 98–111)
Creatinine, Ser: 1.07 mg/dL (ref 0.61–1.24)
GFR, Estimated: >60 mL/min (ref >60)
Glucose, Bld: 214 mg/dL — ABNORMAL HIGH (ref 70–99)
Potassium: 4 mmol/L (ref 3.5–5.1)
Sodium: 133 mmol/L — ABNORMAL LOW (ref 135–145)

## 2021-04-21 LAB — CBC WITH DIFFERENTIAL/PLATELET
Abs Immature Granulocytes: 0.11 10*3/uL — ABNORMAL HIGH (ref 0.00–0.07)
Basophils Absolute: 0.2 10*3/uL — ABNORMAL HIGH (ref 0.0–0.1)
Basophils Relative: 1 %
Eosinophils Absolute: 0.6 10*3/uL — ABNORMAL HIGH (ref 0.0–0.5)
Eosinophils Relative: 3 %
HCT: 51.3 % (ref 39.0–52.0)
Hemoglobin: 16.9 g/dL (ref 13.0–17.0)
Immature Granulocytes: 1 %
Lymphocytes Relative: 22 %
Lymphs Abs: 4 10*3/uL (ref 0.7–4.0)
MCH: 28.3 pg (ref 26.0–34.0)
MCHC: 32.9 g/dL (ref 30.0–36.0)
MCV: 85.8 fL (ref 80.0–100.0)
Monocytes Absolute: 1.1 10*3/uL — ABNORMAL HIGH (ref 0.1–1.0)
Monocytes Relative: 6 %
Neutro Abs: 12.3 10*3/uL — ABNORMAL HIGH (ref 1.7–7.7)
Neutrophils Relative %: 67 %
Platelets: 358 10*3/uL (ref 150–400)
RBC: 5.98 MIL/uL — ABNORMAL HIGH (ref 4.22–5.81)
RDW: 14.3 % (ref 11.5–15.5)
WBC: 18.3 10*3/uL — ABNORMAL HIGH (ref 4.0–10.5)
nRBC: 0 % (ref 0.0–0.2)

## 2021-04-21 LAB — TROPONIN I (HIGH SENSITIVITY)
Troponin I (High Sensitivity): 5 ng/L (ref <18)
Troponin I (High Sensitivity): 4 ng/L (ref <18)

## 2021-04-21 LAB — RESP PANEL BY RT-PCR (FLU A&B, COVID) ARPGX2
Influenza A by PCR: NEGATIVE
Influenza B by PCR: NEGATIVE
SARS Coronavirus 2 by RT PCR: NEGATIVE

## 2021-04-21 IMAGING — CR DG CHEST 2V
2 series · 2 of 2 positions shown · non-contrast
Comparison: [DATE]

CLINICAL DATA: Cough

EXAM:
CHEST - 2 VIEW

[chest pa]
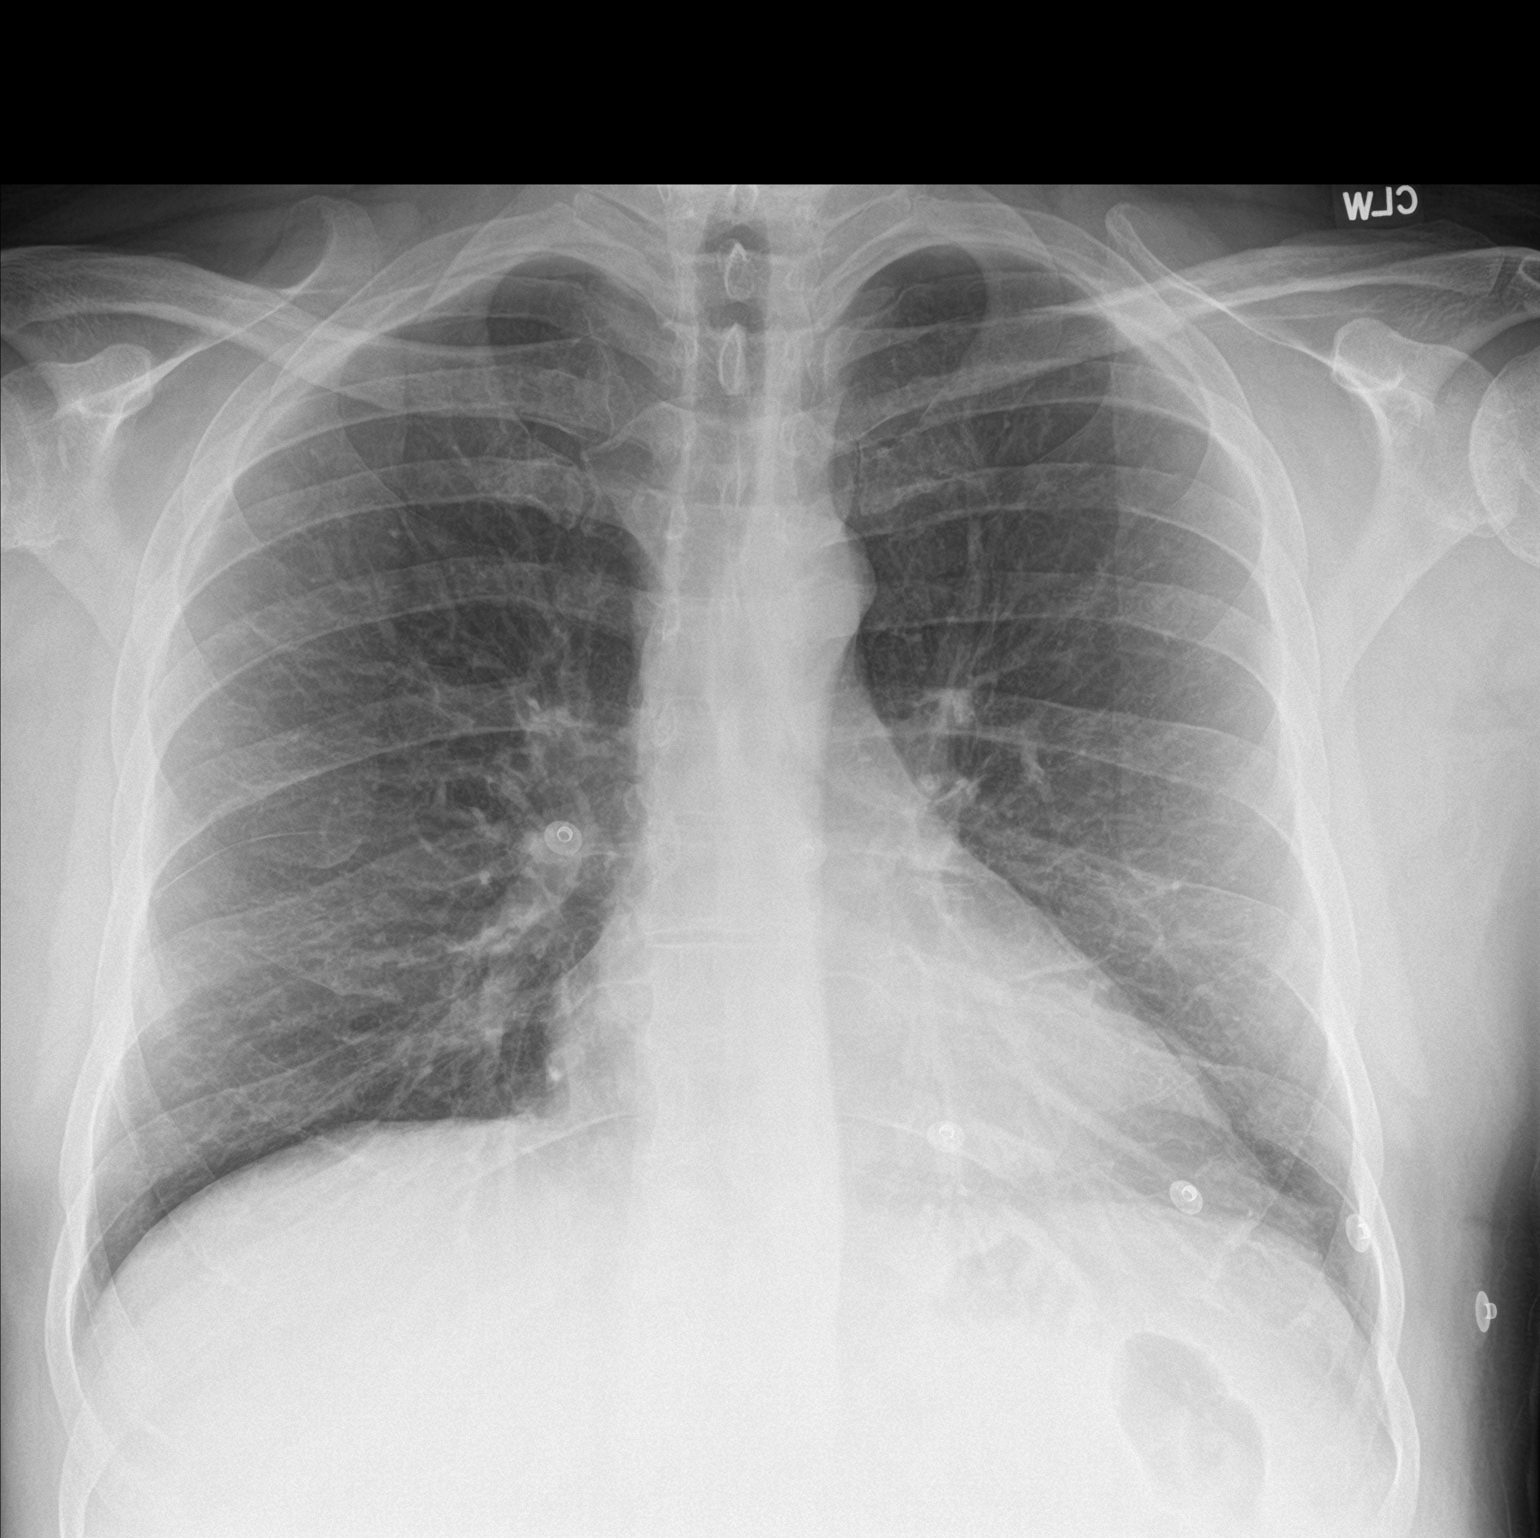

[chest lat]
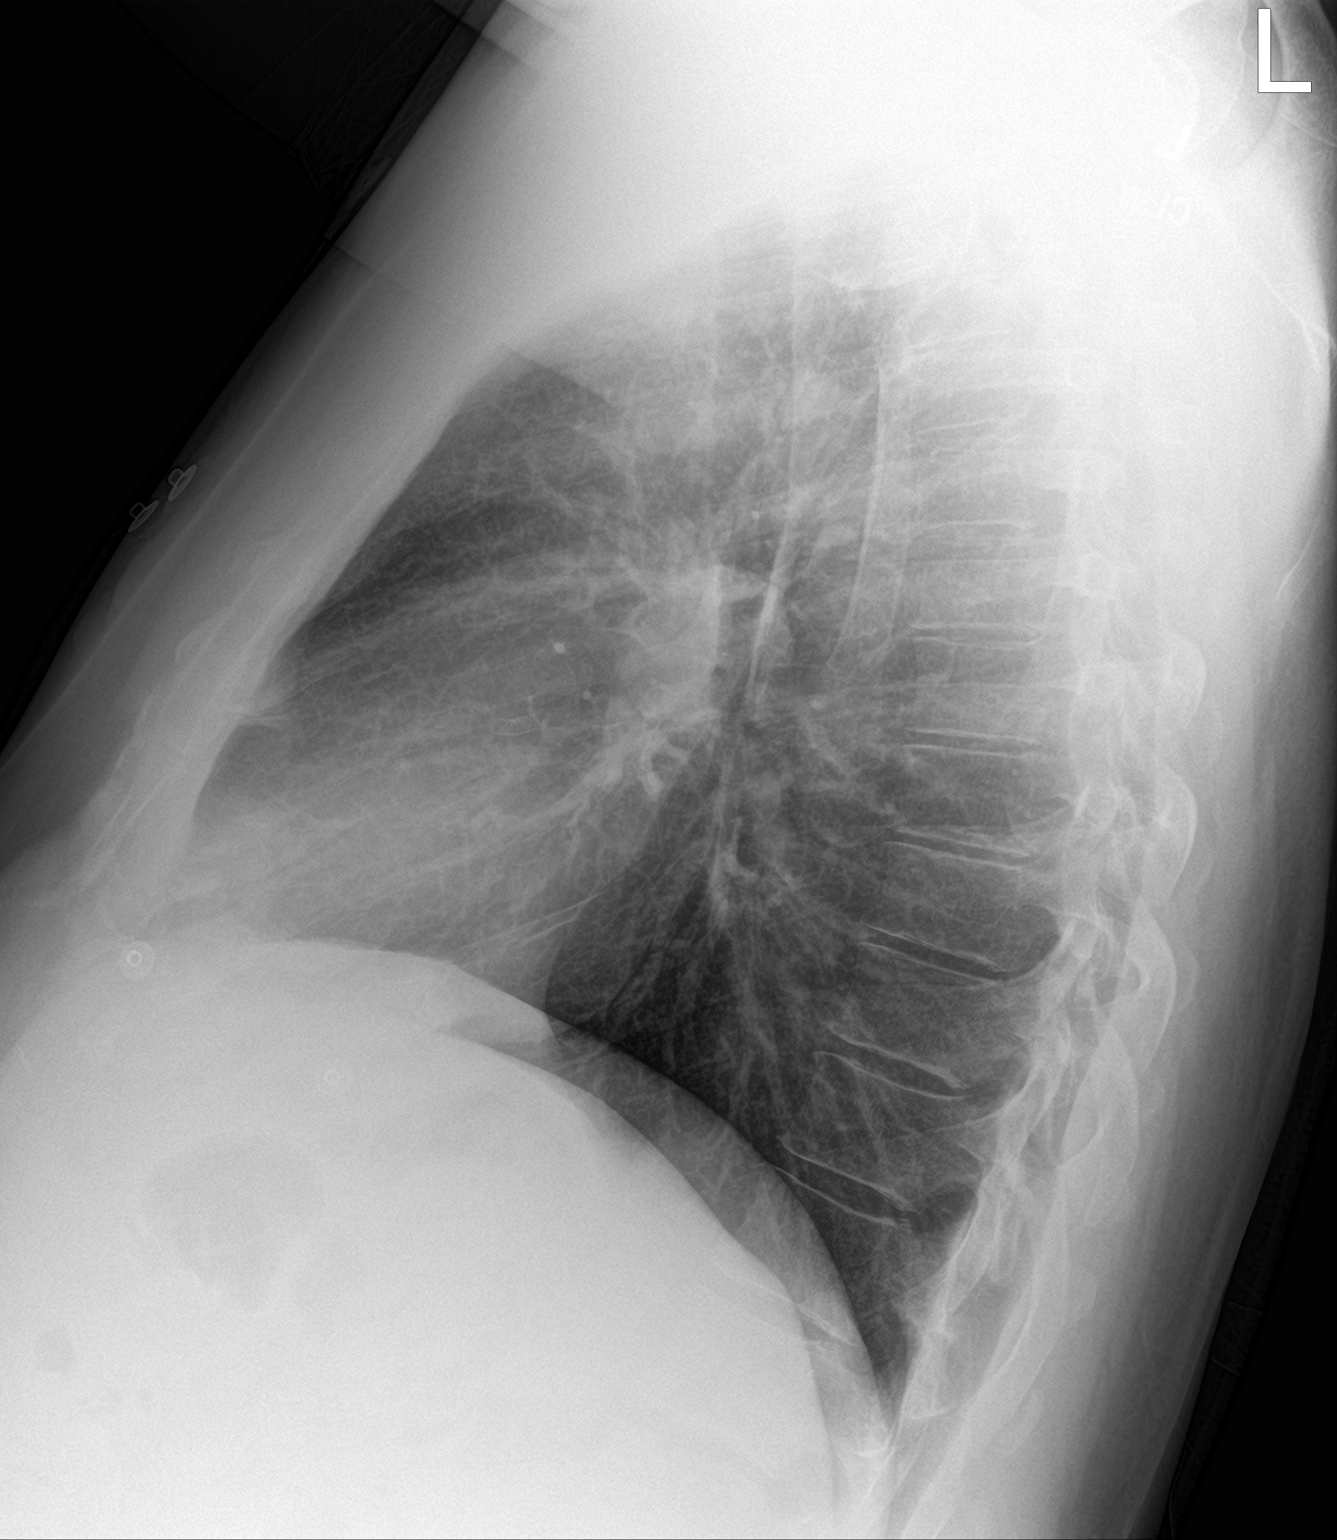

[2 of 2 positions shown; findings below may reference images not displayed]

FINDINGS: Lungs are clear.  No pleural effusion or pneumothorax.

The heart is normal in size.

Visualized osseous structures are within normal limits.
IMPRESSION: Normal chest radiographs.

## 2021-04-21 IMAGING — CT CT ANGIO CHEST
2 of 6 series · 19 of 36 positions shown · IV contrast (omnipaque)
Comparison: Chest x-ray from earlier in the same day.

CLINICAL DATA: Cough

EXAM:
CT ANGIOGRAPHY CHEST WITH CONTRAST
TECHNIQUE: Multidetector CT imaging of the chest was performed using the
standard protocol during bolus administration of intravenous
contrast. Multiplanar CT image reconstructions and MIPs were
obtained to evaluate the vascular anatomy.
CONTRAST:  75mL OMNIPAQUE IOHEXOL 350 MG/ML SOLN

[Series 7: pe thins · axial · 0.97mm/px · z∈[+1094,+1333]mm · 18 of 381 slices shown]
[im 20/381  lung]
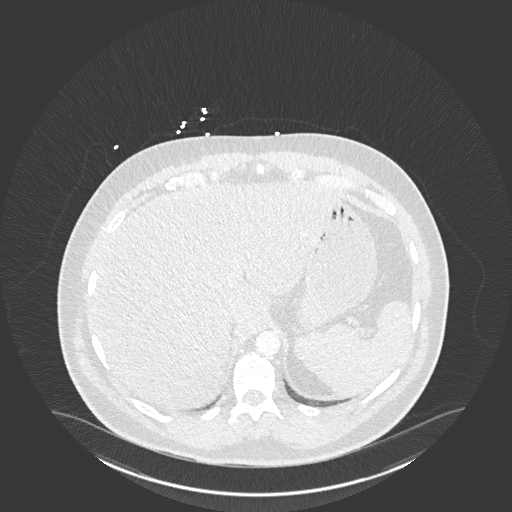
[im 39/381  mediastinal]
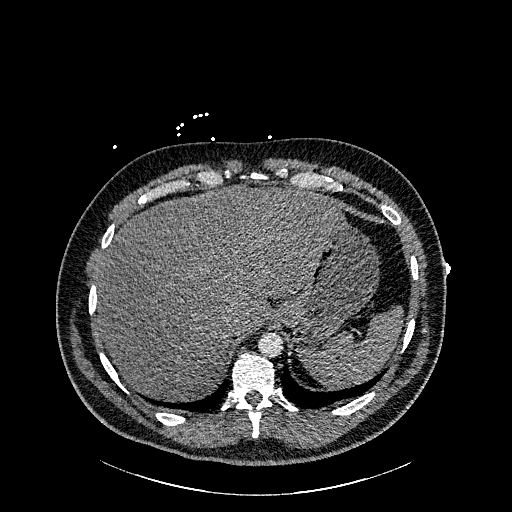
[im 58/381  lung]
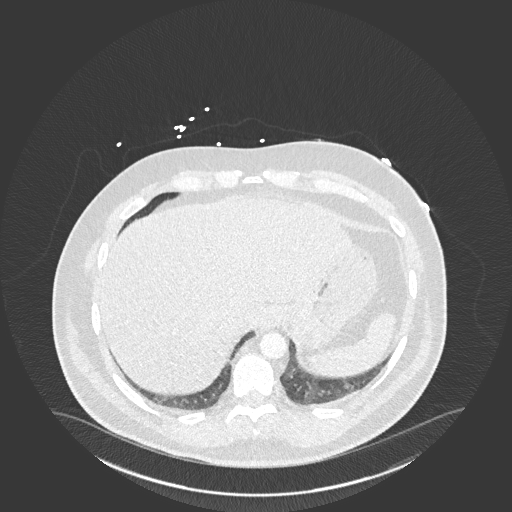
[im 77/381  mediastinal]
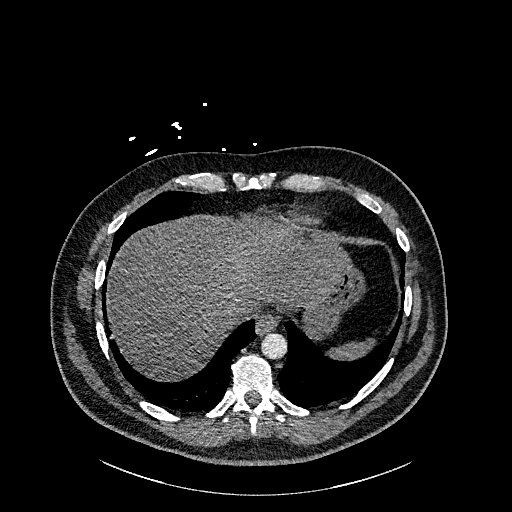
[im 96/381  lung]
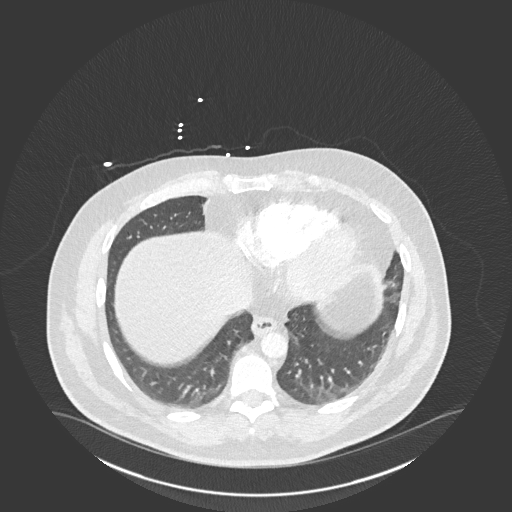
[im 115/381  mediastinal]
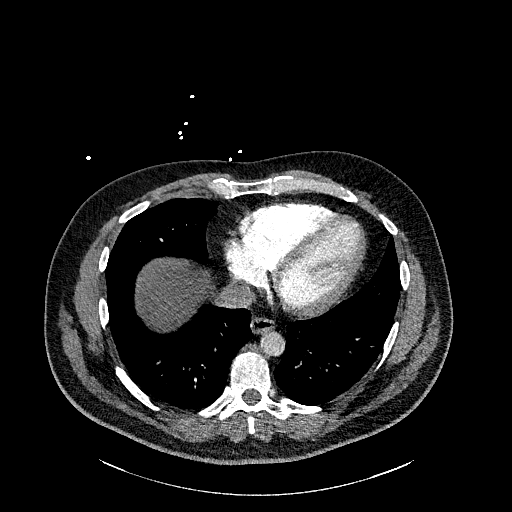
[im 134/381  lung]
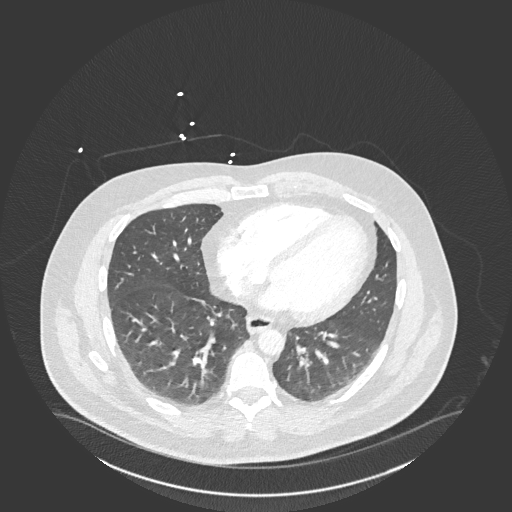
[im 153/381  mediastinal]
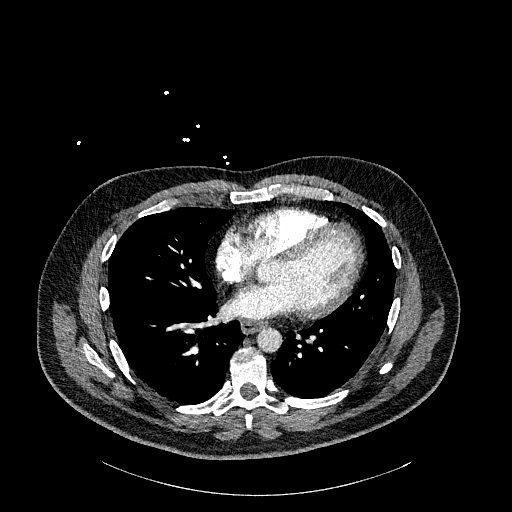
[im 172/381  lung]
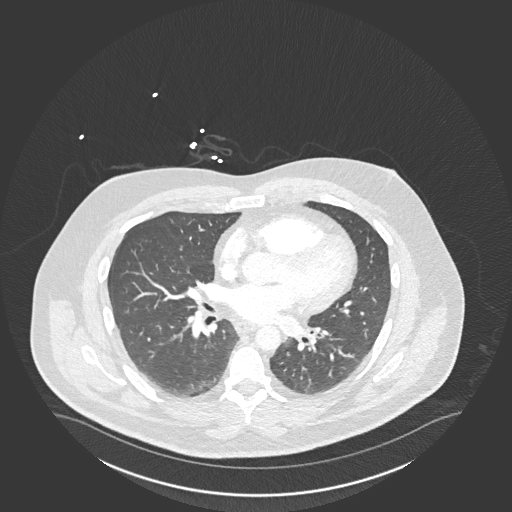
[im 210/381  mediastinal]
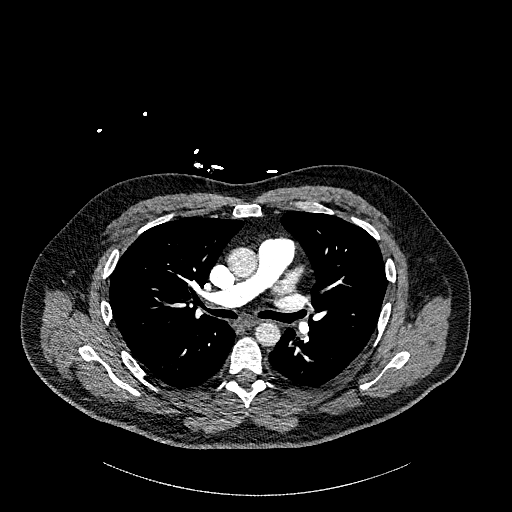
[im 229/381  lung]
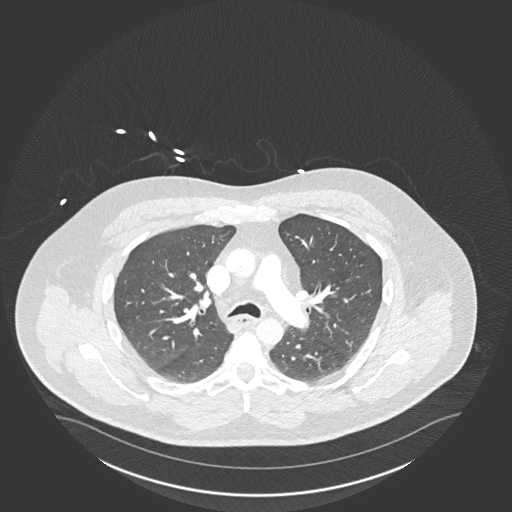
[im 248/381  mediastinal]
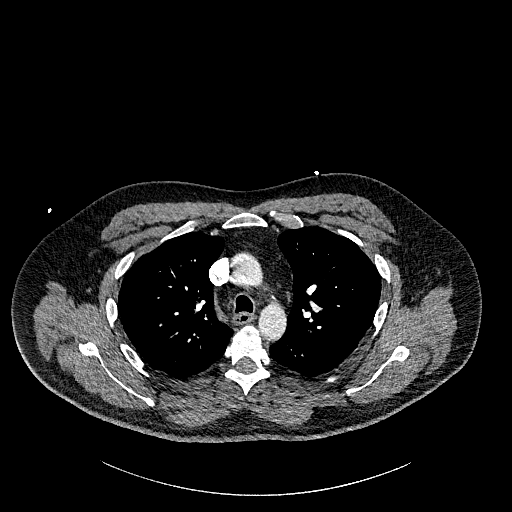
[im 267/381  lung]
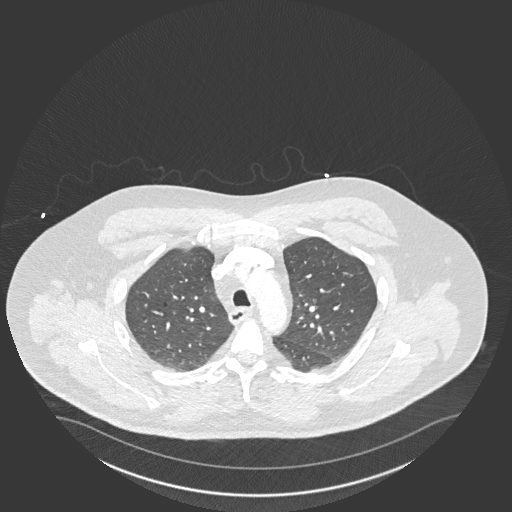
[im 286/381  mediastinal]
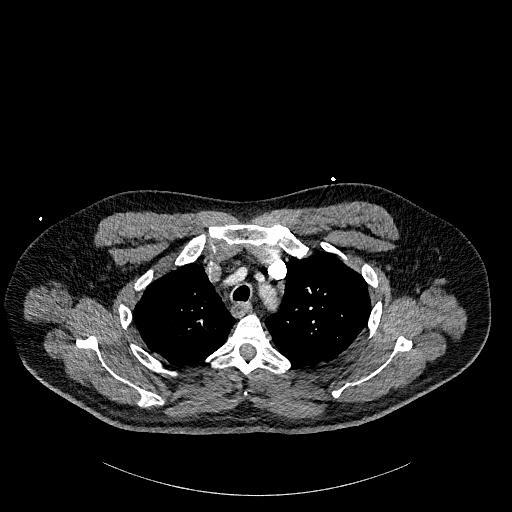
[im 305/381  lung]
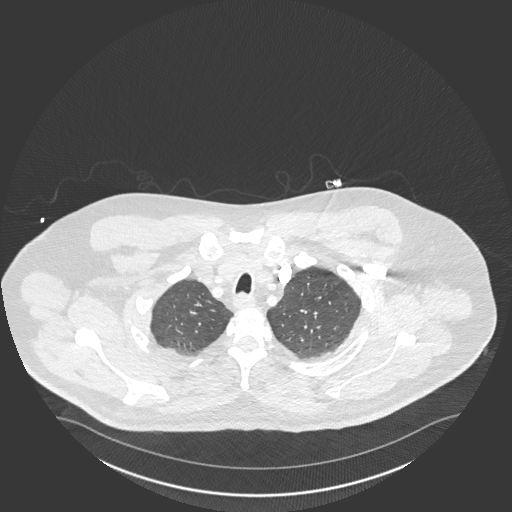
[im 324/381  mediastinal]
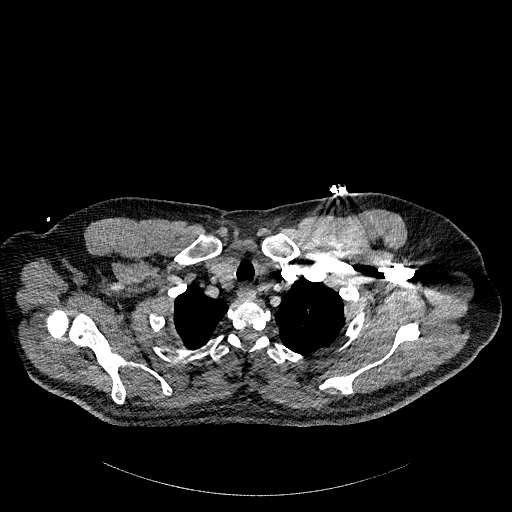
[im 343/381  lung]
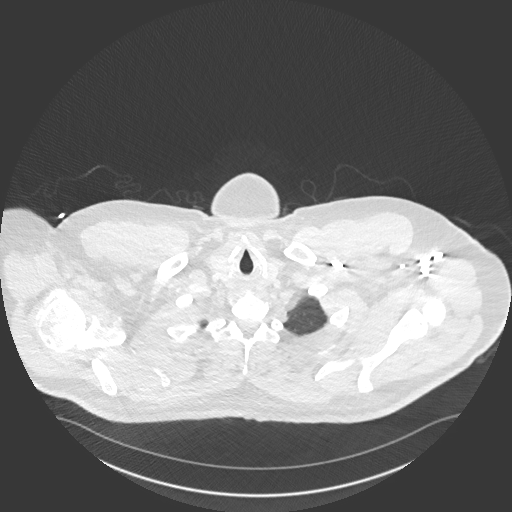
[im 362/381  mediastinal]
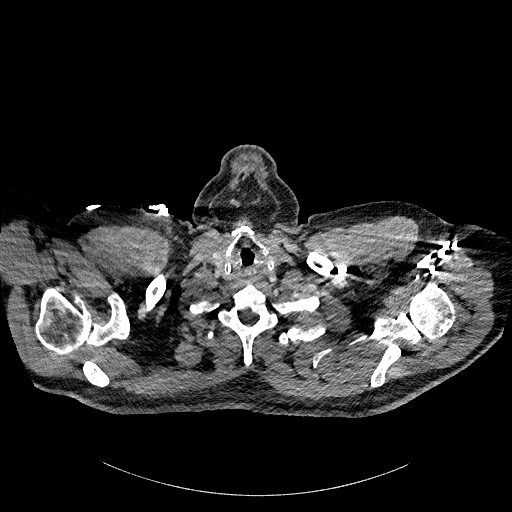

[Series 8: pe 2mm cor · coronal · 0.59mm/px · 1 of 183 slices shown]
[im 92/183  mediastinal]
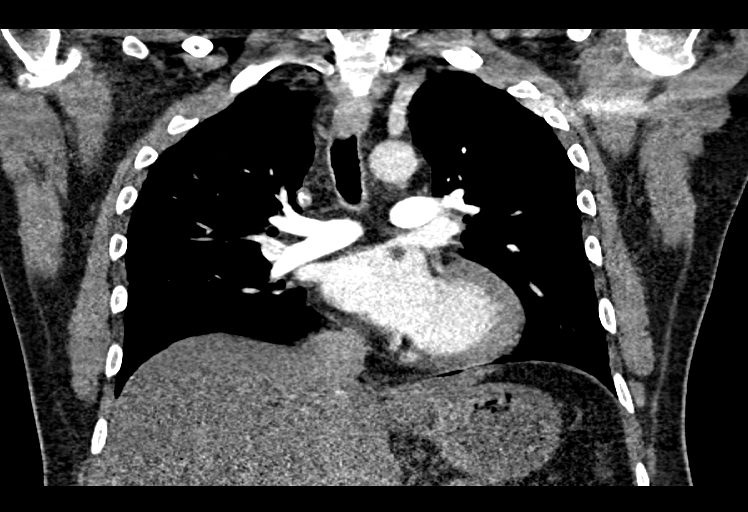

[19 of 36 positions shown; findings below may reference images not displayed]

FINDINGS: Cardiovascular: Thoracic aorta shows atherosclerotic calcifications
without aneurysmal dilatation or dissection. Pulmonary artery is
well visualized within normal branching pattern. No filling defect
to suggest pulmonary embolism is noted. Prior coronary stenting is
noted.

Mediastinum/Nodes: Thoracic inlet is within normal limits. No
sizable hilar or mediastinal adenopathy is noted. The esophagus as
visualized is within normal limits.

Lungs/Pleura: Lungs are well aerated bilaterally. No focal
infiltrate or sizable effusion is seen.

Upper Abdomen: Visualized upper abdomen shows fatty infiltration of
the liver.

Musculoskeletal: Degenerative changes of the thoracic spine are
noted. No rib abnormality is noted.

Review of the MIP images confirms the above findings.
IMPRESSION: No evidence of pulmonary emboli.

No acute abnormality noted.

Fatty liver.

Aortic Atherosclerosis ([G5]-[G5]).

## 2021-04-21 MED ORDER — IOHEXOL 350 MG/ML SOLN
75.0000 mL | Freq: Once | INTRAVENOUS | Status: AC | PRN
  Administered 2021-04-21: 75 mL via INTRAVENOUS

## 2021-04-21 MED ORDER — CLONAZEPAM 0.5 MG PO TABS
1.0000 mg | ORAL_TABLET | Freq: Once | ORAL | Status: AC
  Administered 2021-04-21: 1 mg via ORAL
  Filled 2021-04-21: qty 2

## 2021-04-21 MED ORDER — OXYCODONE-ACETAMINOPHEN 5-325 MG PO TABS
2.0000 | ORAL_TABLET | Freq: Once | ORAL | Status: AC
  Administered 2021-04-22: 2 via ORAL
  Filled 2021-04-21: qty 2

## 2021-04-21 NOTE — ED Triage Notes (Signed)
Patient arrived with EMS from home reports right chest pain with SOB , diaphoresis and emesis this evening , also had a syncopal episode this evening , he received ASA 324 mg and 2 NTG sl prior to arrival with slight relief , CBG=247.

## 2021-04-21 NOTE — ED Provider Notes (Signed)
[facility] EMERGENCY DEPARTMENT Provider Note   CSN: [id number] Arrival date & time: 04/21/21  1939     History Chief Complaint  Patient presents with   Chest Pain / Syncope    Matthew [name] is a 45 y.o. male.  The history is provided by the patient and medical records.  Chest Pain Pain location:  R chest Pain quality: sharp   Pain radiates to:  Does not radiate Pain severity:  Moderate Onset quality:  Sudden Duration:  6 hours Timing:  Constant Progression:  Waxing and waning Context: at rest   Relieved by:  Nothing Worsened by:  Coughing and deep breathing Associated symptoms: no abdominal pain, no back pain, no cough, no fever, no palpitations, no shortness of breath and no vomiting       Past Medical History:  Diagnosis Date   Anxiety    Celiac disease    Coronary artery disease    Dairy allergy    Depression    Diabetes mellitus without complication (HCC)    High cholesterol    Hypertension    Paranoia ([place])    Pre-diabetes    Schizoaffective disorder ([place])     Patient Active Problem List   Diagnosis Date Noted   Unstable angina ([place]) 02/24/2021   Right-sided chest pain 09/11/2020   Coronary artery disease of bypass graft of native heart with stable angina pectoris ([place])    Uncontrolled type 2 diabetes mellitus with hyperglycemia ([place])    Hyponatremia    Essential hypertension    History of hyperprolactinemia 08/16/2020   Mixed dyslipidemia 08/15/2020   Chest pain 06/06/2020   Type 2 diabetes mellitus with hyperglycemia ([place]) 06/06/2020   Hyperkalemia 06/06/2020   Thrombocytopenia ([place]) 06/06/2020   Obesity (BMI 30.0-34.9) 06/06/2020   Non-ST elevation (NSTEMI) myocardial infarction [facility])    Attention deficit hyperactivity disorder (ADHD), combined type, moderate 03/10/2018   Generalized anxiety disorder 03/10/2018   Schizoaffective disorder, bipolar type without good prognostic features ([place]) 02/09/2015   Morbid obesity  ([place]) 01/18/2015   OSA (obstructive sleep apnea) 07/14/2014   Solitary pulmonary nodule 07/14/2014    Past Surgical History:  Procedure Laterality Date   APPENDECTOMY     BRAIN SURGERY     CARDIAC CATHETERIZATION     CORONARY STENT INTERVENTION N/A 06/06/2020   Procedure: CORONARY STENT INTERVENTION;  Surgeon: Martinique, Peter M, MD;  Location: [facility] CV LAB;  Service: Cardiovascular;  Laterality: N/A;  prox LAD, distal RCA   INTRAVASCULAR ULTRASOUND/IVUS N/A 06/06/2020   Procedure: Intravascular Ultrasound/IVUS;  Surgeon: Martinique, Peter M, MD;  Location: [place] CV LAB;  Service: Cardiovascular;  Laterality: N/A;   LEFT HEART CATH AND CORONARY ANGIOGRAPHY N/A 06/06/2020   Procedure: LEFT HEART CATH AND CORONARY ANGIOGRAPHY;  Surgeon: Martinique, Peter M, MD;  Location: [place] CV LAB;  Service: Cardiovascular;  Laterality: N/A;   LEFT HEART CATH AND CORONARY ANGIOGRAPHY N/A 02/24/2021   Procedure: LEFT HEART CATH AND CORONARY ANGIOGRAPHY;  Surgeon: Belva Crome, MD;  Location: [place] CV LAB;  Service: Cardiovascular;  Laterality: N/A;   NASAL SEPTUM SURGERY     WRIST FRACTURE SURGERY     right wrist       Family History  Problem Relation Age of Onset   Asthma Mother    Hypertension Mother    Hypertension Father     Social History   Tobacco Use   Smoking status: Every Day    Packs/day: 0.50    Types:  Cigarettes, E-cigarettes   Smokeless tobacco: Never   Tobacco comments:    patient given phone number to 1-800-quit now on 08/16/20  Vaping Use   Vaping Use: Never used  Substance Use Topics   Alcohol use: No    Alcohol/week: 0.0 standard drinks    Comment: no (02/08/15)   Drug use: No    Home Medications Prior to Admission medications   Medication Sig Start Date End Date Taking? Authorizing Provider  perphenazine (TRILAFON) 16 MG tablet TAKE 1 PILL TOTAL 16 MG EVERY MORNING AND TAKE 2 PILLS BY MOUTH TOTAL 32 MG EVERY BEDTIME 04/21/21   Adelene Idler, Dorothea Glassman, PA-C   aspirin EC 81 MG tablet Take 1 tablet (81 mg total) by mouth daily. Swallow whole. 06/07/20   Freada Bergeron, MD  atorvastatin (LIPITOR) 80 MG tablet Take 1 tablet (80 mg total) by mouth every morning. 04/10/21   Freada Bergeron, MD  carvedilol (COREG) 25 MG tablet Take 1 tablet (25 mg total) by mouth 2 (two) times daily. 10/10/20   Freada Bergeron, MD  clonazePAM (KLONOPIN) 1 MG tablet Take 1 tablet (1 mg total) by mouth 3 (three) times daily as needed for anxiety. 04/17/21   Addison Lank, PA-C  DULoxetine (CYMBALTA) 60 MG capsule Take 1 capsule (60 mg total) by mouth 2 (two) times daily. 03/06/21   Addison Lank, PA-C  empagliflozin (JARDIANCE) 25 MG TABS tablet Take 1 tablet (25 mg total) by mouth daily before breakfast. 10/10/20   Freada Bergeron, MD  gabapentin (NEURONTIN) 800 MG tablet 1 po q am, 1/2 po at noon, 1 po at 4 pm, and 1 po at bedtime. Patient not taking: Reported on 03/26/2021 12/07/20   Donnal Moat T, PA-C  icosapent Ethyl (VASCEPA) 1 g capsule Take 2 capsules (2 g total) by mouth 2 (two) times daily. Patient not taking: Reported on 02/24/2021 01/20/21   Supple, Megan E, RPH-CPP  insulin degludec (TRESIBA FLEXTOUCH) 100 UNIT/ML FlexTouch Pen Inject 20 Units into the skin daily. Patient taking differently: Inject 20 Units into the skin daily after breakfast. 11/16/20   Shamleffer, Melanie Crazier, MD  Insulin Pen Needle 32G X 4 MM MISC 1 Device by Does not apply route daily. 08/15/20   Shamleffer, Melanie Crazier, MD  isosorbide mononitrate (IMDUR) 30 MG 24 hr tablet Take 1 tablet (30 mg total) by mouth daily. Patient not taking: Reported on 03/06/2021 02/26/21   Barrett, Evelene Croon, PA-C  lisinopril (ZESTRIL) 20 MG tablet Take 1 tablet (20 mg total) by mouth daily. 06/08/20   Imogene Burn, PA-C  lithium carbonate (LITHOBID) 300 MG CR tablet Take 3 tablets (900 mg total) by mouth at bedtime. Patient taking differently: Take 1,200 mg by mouth at bedtime. 03/27/21    Donnal Moat T, PA-C  metFORMIN (GLUCOPHAGE) 1000 MG tablet TAKE 1 TABLET (1,000 MG TOTAL) BY MOUTH 2 (TWO) TIMES DAILY WITH A MEAL. 01/02/21   Shamleffer, Melanie Crazier, MD  nicotine (NICODERM CQ - DOSED IN MG/24 HOURS) 21 mg/24hr patch Place 1 patch (21 mg total) onto the skin daily. Patient not taking: Reported on 03/06/2021 02/26/21   Barrett, Evelene Croon, PA-C  nitroGLYCERIN (NITROSTAT) 0.4 MG SL tablet Place 1 tablet (0.4 mg total) under the tongue every 5 (five) minutes as needed for chest pain. 08/10/20 08/10/21  Kathyrn Drown D, NP  oxyCODONE-acetaminophen (PERCOCET) 10-325 MG tablet Take 1 tablet by mouth 5 (five) times daily. 10/20/20   [provider]  pregabalin (LYRICA) 50  MG capsule Take 50 mg by mouth 2 (two) times daily. 02/28/21   [provider]  pseudoephedrine (SUDAFED) 120 MG 12 hr tablet Take 120 mg by mouth every 12 (twelve) hours as needed for congestion. Patient not taking: Reported on 04/17/2021    [provider]  spironolactone (ALDACTONE) 25 MG tablet Take 0.5 tablets (12.5 mg total) by mouth daily. Patient not taking: Reported on 02/24/2021 07/20/20 07/15/21  Freada Bergeron, MD  ticagrelor (BRILINTA) 90 MG TABS tablet Take 1 tablet (90 mg total) by mouth 2 (two) times daily. 06/07/20   Freada Bergeron, MD  traZODone (DESYREL) 100 MG tablet TAKE 3 TABLETS BY MOUTH EVERY DAY AT BEDTIME AS NEEDED FOR SLEEP Patient taking differently: Take 300 mg by mouth at bedtime. 03/06/21   Donnal Moat T, PA-C  Vitamin D, Ergocalciferol, (DRISDOL) 1.25 MG (50000 UNIT) CAPS capsule Take 50,000 Units by mouth every Sunday. 11/28/20   [provider]  zaleplon (SONATA) 10 MG capsule 1 p.o. nightly as needed sleep.  May repeat 1 p.o. for mid nocturnal awakening as long as he has 3 hours or more left to sleep. 04/17/21   Donnal Moat T, PA-C    Allergies    Gluten meal and Lactose intolerance (gi)  Review of Systems   Review of Systems   Constitutional:  Negative for chills and fever.  HENT:  Negative for ear pain and sore throat.   Eyes:  Negative for pain and visual disturbance.  Respiratory:  Negative for cough and shortness of breath.   Cardiovascular:  Positive for chest pain. Negative for palpitations.  Gastrointestinal:  Negative for abdominal pain and vomiting.  Genitourinary:  Negative for dysuria and hematuria.  Musculoskeletal:  Negative for arthralgias and back pain.  Skin:  Negative for color change and rash.  Neurological:  Negative for seizures and syncope.  All other systems reviewed and are negative.  Physical Exam Updated Vital Signs BP (!) 138/91   Pulse 98   Temp 99 F (37.2 C) (Oral)   Resp (!) 25   Ht 6\' 3"  (1.905 m)   Wt 122.5 kg   SpO2 95%   BMI 33.75 kg/m   Physical Exam Vitals and nursing note reviewed.  Constitutional:      General: He is not in acute distress.    Appearance: Normal appearance. He is well-developed. He is obese.  HENT:     Head: Normocephalic and atraumatic.     Right Ear: External ear normal.     Left Ear: External ear normal.     Nose: Nose normal. No congestion.     Mouth/Throat:     Mouth: Mucous membranes are moist.     Pharynx: Oropharynx is clear. No posterior oropharyngeal erythema.  Eyes:     Extraocular Movements: Extraocular movements intact.     Conjunctiva/sclera: Conjunctivae normal.     Pupils: Pupils are equal, round, and reactive to light.  Cardiovascular:     Rate and Rhythm: Regular rhythm. Tachycardia present.     Pulses: Normal pulses.     Heart sounds: No murmur heard. Pulmonary:     Effort: Pulmonary effort is normal. No respiratory distress.     Breath sounds: Normal breath sounds. No wheezing, rhonchi or rales.  Abdominal:     General: Abdomen is flat. Bowel sounds are normal.     Palpations: Abdomen is soft.     Tenderness: There is no abdominal tenderness. There is no guarding or rebound.  Musculoskeletal:  General: No  swelling, tenderness or deformity. Normal range of motion.     Cervical back: Normal range of motion and neck supple. No rigidity.     Right lower leg: Edema (trace) present.     Left lower leg: Edema (trace) present.  Skin:    General: Skin is warm and dry.     Capillary Refill: Capillary refill takes less than 2 seconds.     Findings: No rash.  Neurological:     General: No focal deficit present.     Mental Status: He is alert and oriented to person, place, and time.  Psychiatric:        Mood and Affect: Mood normal.    ED Results / Procedures / Treatments   Labs (all labs ordered are listed, but only abnormal results are displayed) Labs Reviewed  BASIC METABOLIC PANEL - Abnormal; Notable for the following components:      Result Value   Sodium 133 (*)    Chloride 95 (*)    Glucose, Bld 214 (*)    Calcium 10.8 (*)    All other components within normal limits  CBC WITH DIFFERENTIAL/PLATELET - Abnormal; Notable for the following components:   WBC 18.3 (*)    RBC 5.98 (*)    Neutro Abs 12.3 (*)    Monocytes Absolute 1.1 (*)    Eosinophils Absolute 0.6 (*)    Basophils Absolute 0.2 (*)    Abs Immature Granulocytes 0.11 (*)    All other components within normal limits  RESP PANEL BY RT-PCR (FLU A&B, COVID) ARPGX2  TROPONIN I (HIGH SENSITIVITY)  TROPONIN I (HIGH SENSITIVITY)    EKG None  Radiology DG Chest 2 View  Result Date: 04/21/2021 CLINICAL DATA:  Cough EXAM: CHEST - 2 VIEW COMPARISON:  03/26/2021 FINDINGS: Lungs are clear.  No pleural effusion or pneumothorax. The heart is normal in size. Visualized osseous structures are within normal limits. IMPRESSION: Normal chest radiographs. Electronically Signed   By: Julian Hy M.D.   On: 04/21/2021 21:08   CT Angio Chest PE W and/or Wo Contrast  Result Date: 04/22/2021 CLINICAL DATA:  Cough EXAM: CT ANGIOGRAPHY CHEST WITH CONTRAST TECHNIQUE: Multidetector CT imaging of the chest was performed using the standard  protocol during bolus administration of intravenous contrast. Multiplanar CT image reconstructions and MIPs were obtained to evaluate the vascular anatomy. CONTRAST:  50mL OMNIPAQUE IOHEXOL 350 MG/ML SOLN COMPARISON:  Chest x-ray from earlier in the same day. FINDINGS: Cardiovascular: Thoracic aorta shows atherosclerotic calcifications without aneurysmal dilatation or dissection. Pulmonary artery is well visualized within normal branching pattern. No filling defect to suggest pulmonary embolism is noted. Prior coronary stenting is noted. Mediastinum/Nodes: Thoracic inlet is within normal limits. No sizable hilar or mediastinal adenopathy is noted. The esophagus as visualized is within normal limits. Lungs/Pleura: Lungs are well aerated bilaterally. No focal infiltrate or sizable effusion is seen. Upper Abdomen: Visualized upper abdomen shows fatty infiltration of the liver. Musculoskeletal: Degenerative changes of the thoracic spine are noted. No rib abnormality is noted. Review of the MIP images confirms the above findings. IMPRESSION: No evidence of pulmonary emboli. No acute abnormality noted. Fatty liver. Aortic Atherosclerosis (ICD10-I70.0). Electronically Signed   By: Inez Catalina M.D.   On: 04/22/2021 00:01    Procedures Procedures   Medications Ordered in ED Medications  clonazePAM (KLONOPIN) tablet 1 mg (1 mg Oral Given 04/21/21 2224)  iohexol (OMNIPAQUE) 350 MG/ML injection 75 mL (75 mLs Intravenous Contrast Given 04/21/21 2344)  oxyCODONE-acetaminophen (PERCOCET/ROXICET)  5-325 MG per tablet 2 tablet (2 tablets Oral Given 04/22/21 0000)    ED Course  I have reviewed the triage vital signs and the nursing notes.  Pertinent labs & imaging results that were available during my care of the patient were reviewed by me and considered in my medical decision making (see chart for details).    MDM Rules/Calculators/A&P                          45 year old male with history of type 2 diabetes, CAD  status post 2 stents, heavy tobacco use coming in with chest pain as above.  Syncopal episode following the onset of his chest pain.  Pain described as pleuritic in nature.  He is tachycardic on arrival, but not hypoxic or in any respiratory distress.   EKG showed sinus tachycardia w/ no ischemic changes.  Initial troponin normal.  Delta unchanged. I am concerned for possible PE.  CTA chest pending. COVID flu negative.  Chest x-ray showed no signs of pneumothorax, pneumonia. No tearing chest pain to back, no pulse deficits, not significantly hypertensive.  Low concern for aortic dissection at this time. Discussed findings with patient.  CTA chest showed no signs of PE.  He is appropriate for discharge home with close follow-up with PCP and cardiologist for reassessment.  Strict return precautions provided.  Final Clinical Impression(s) / ED Diagnoses Final diagnoses:  Chest pain, unspecified type  Chronic cough    Rx / DC Orders ED Discharge Orders     None        Lutricia Feil, MD 04/22/21 [id number]    Derwood Kaplan, MD 04/22/21 [phone]

## 2021-04-21 NOTE — ED Provider Notes (Signed)
Emergency Medicine Provider Triage Evaluation Note  Matthew [name] , a 45 y.o. male  was evaluated in triage.  Pt complains of syncope.  He states that this evening he was at home when he had a "coughing fit."  He states that at the end of his coughing fit he had a syncopal episode.  He states that he felt like he was having palpitations prior to passing out.  He does not know how long she passed out for.  States that he called EMS and reporting right-sided chest pain or shortness of breath.  He received 324 of aspirin and 2 nitroglycerin prior to her arrival to the ED.  Initial blood glucose was 247.  He does endorse smoking 3 packs of cigarettes a day.  He denies fever, new sputum production,, nausea, vomiting, diarrhea, incontinence of bowel or bladder, abdominal pain, history of seizures.  Review of Systems  Positive: See above Negative:   Physical Exam  BP 118/77 (BP Location: Right Arm)   Pulse (!) 123   Temp 98.3 F (36.8 C) (Oral)   Resp (!) 24   Ht 6\' 3"  (1.905 m)   Wt 135 kg   SpO2 92%   BMI 37.20 kg/m  Gen:   Awake, no distress alert and oriented Resp:  Normal effort, lungs clear to auscultation, however patient coughs immediately after trying to take deep breaths. MSK:   Moves extremities without difficulty  Other:  S1/S2 without murmurs  Medical Decision Making  Medically screening exam initiated at 8:18 PM.  Appropriate orders placed.  JAKARI SADA was informed that the remainder of the evaluation will be completed by another provider, this initial triage assessment does not replace that evaluation, and the importance of remaining in the ED until their evaluation is complete.     [name], PA-C 04/21/21 2020    14/02/22, MD 04/28/21 1024

## 2021-04-22 NOTE — Discharge Instructions (Addendum)
Please follow-up with your PCP and cardiologist for reassessment.  May try anti-inflammatories, heating pads, lidocaine patches for chest wall discomfort.  Please avoid smoking and minimizing triggers for coughing.  Return to the ED with any worsening chest pain, syncope, fevers, headaches, etc.

## 2021-04-23 ENCOUNTER — Encounter (HOSPITAL_COMMUNITY): Payer: Self-pay | Admitting: Emergency Medicine

## 2021-04-23 ENCOUNTER — Emergency Department (HOSPITAL_COMMUNITY): Payer: Medicaid Other

## 2021-04-23 ENCOUNTER — Other Ambulatory Visit: Payer: Self-pay

## 2021-04-23 ENCOUNTER — Observation Stay (HOSPITAL_COMMUNITY)
Admission: EM | Admit: 2021-04-23 | Discharge: 2021-04-24 | Disposition: A | Discharge status: Home / Self Care | Payer: Medicaid Other | Attending: Internal Medicine | Admitting: Internal Medicine

## 2021-04-23 DIAGNOSIS — Z20822 Contact with and (suspected) exposure to covid-19: Secondary | ICD-10-CM | POA: Insufficient documentation

## 2021-04-23 DIAGNOSIS — Z955 Presence of coronary angioplasty implant and graft: Secondary | ICD-10-CM | POA: Insufficient documentation

## 2021-04-23 DIAGNOSIS — Z7984 Long term (current) use of oral hypoglycemic drugs: Secondary | ICD-10-CM | POA: Diagnosis not present

## 2021-04-23 DIAGNOSIS — D72829 Elevated white blood cell count, unspecified: Secondary | ICD-10-CM | POA: Diagnosis present

## 2021-04-23 DIAGNOSIS — F17210 Nicotine dependence, cigarettes, uncomplicated: Secondary | ICD-10-CM | POA: Insufficient documentation

## 2021-04-23 DIAGNOSIS — E7800 Pure hypercholesterolemia, unspecified: Secondary | ICD-10-CM

## 2021-04-23 DIAGNOSIS — E119 Type 2 diabetes mellitus without complications: Secondary | ICD-10-CM | POA: Insufficient documentation

## 2021-04-23 DIAGNOSIS — Z720 Tobacco use: Secondary | ICD-10-CM | POA: Diagnosis present

## 2021-04-23 DIAGNOSIS — Z951 Presence of aortocoronary bypass graft: Secondary | ICD-10-CM | POA: Diagnosis not present

## 2021-04-23 DIAGNOSIS — I10 Essential (primary) hypertension: Secondary | ICD-10-CM | POA: Diagnosis not present

## 2021-04-23 DIAGNOSIS — S2231XA Fracture of one rib, right side, initial encounter for closed fracture: Secondary | ICD-10-CM | POA: Diagnosis present

## 2021-04-23 DIAGNOSIS — Z79899 Other long term (current) drug therapy: Secondary | ICD-10-CM | POA: Insufficient documentation

## 2021-04-23 DIAGNOSIS — R079 Chest pain, unspecified: Secondary | ICD-10-CM

## 2021-04-23 DIAGNOSIS — I2510 Atherosclerotic heart disease of native coronary artery without angina pectoris: Secondary | ICD-10-CM | POA: Diagnosis not present

## 2021-04-23 DIAGNOSIS — E871 Hypo-osmolality and hyponatremia: Secondary | ICD-10-CM | POA: Diagnosis not present

## 2021-04-23 DIAGNOSIS — E1165 Type 2 diabetes mellitus with hyperglycemia: Secondary | ICD-10-CM

## 2021-04-23 DIAGNOSIS — R0789 Other chest pain: Principal | ICD-10-CM | POA: Insufficient documentation

## 2021-04-23 DIAGNOSIS — Z7982 Long term (current) use of aspirin: Secondary | ICD-10-CM | POA: Diagnosis not present

## 2021-04-23 DIAGNOSIS — Z794 Long term (current) use of insulin: Secondary | ICD-10-CM

## 2021-04-23 DIAGNOSIS — R55 Syncope and collapse: Secondary | ICD-10-CM | POA: Insufficient documentation

## 2021-04-23 DIAGNOSIS — F411 Generalized anxiety disorder: Secondary | ICD-10-CM

## 2021-04-23 LAB — CBC
HCT: 48.1 % (ref 39.0–52.0)
Hemoglobin: 16 g/dL (ref 13.0–17.0)
MCH: 28.7 pg (ref 26.0–34.0)
MCHC: 33.3 g/dL (ref 30.0–36.0)
MCV: 86.2 fL (ref 80.0–100.0)
Platelets: 321 10*3/uL (ref 150–400)
RBC: 5.58 MIL/uL (ref 4.22–5.81)
RDW: 14.3 % (ref 11.5–15.5)
WBC: 17.3 10*3/uL — ABNORMAL HIGH (ref 4.0–10.5)
nRBC: 0 % (ref 0.0–0.2)

## 2021-04-23 LAB — BASIC METABOLIC PANEL
Anion gap: 11 (ref 5–15)
BUN: 11 mg/dL (ref 6–20)
CO2: 20 mmol/L — ABNORMAL LOW (ref 22–32)
Calcium: 9.6 mg/dL (ref 8.9–10.3)
Chloride: 98 mmol/L (ref 98–111)
Creatinine, Ser: 1.02 mg/dL (ref 0.61–1.24)
GFR, Estimated: >60 mL/min (ref >60)
Glucose, Bld: 179 mg/dL — ABNORMAL HIGH (ref 70–99)
Potassium: 4.1 mmol/L (ref 3.5–5.1)
Sodium: 129 mmol/L — ABNORMAL LOW (ref 135–145)

## 2021-04-23 LAB — RAPID URINE DRUG SCREEN, HOSP PERFORMED
Amphetamines: NOT DETECTED
Barbiturates: NOT DETECTED
Benzodiazepines: NOT DETECTED
Cocaine: NOT DETECTED
Opiates: NOT DETECTED
Tetrahydrocannabinol: NOT DETECTED

## 2021-04-23 LAB — URINALYSIS, COMPLETE (UACMP) WITH MICROSCOPIC
Bacteria, UA: NONE SEEN
Bilirubin Urine: NEGATIVE
Glucose, UA: >=500 mg/dL — AB
Hgb urine dipstick: NEGATIVE
Ketones, ur: NEGATIVE mg/dL
Leukocytes,Ua: NEGATIVE
Nitrite: NEGATIVE
Protein, ur: NEGATIVE mg/dL
Specific Gravity, Urine: 1.012 (ref 1.005–1.030)
pH: 6 (ref 5.0–8.0)

## 2021-04-23 LAB — CBG MONITORING, ED: Glucose-Capillary: 207 mg/dL — ABNORMAL HIGH (ref 70–99)

## 2021-04-23 LAB — TROPONIN I (HIGH SENSITIVITY)
Troponin I (High Sensitivity): 4 ng/L (ref <18)
Troponin I (High Sensitivity): 4 ng/L (ref <18)

## 2021-04-23 LAB — RESP PANEL BY RT-PCR (FLU A&B, COVID) ARPGX2
Influenza A by PCR: NEGATIVE
Influenza B by PCR: NEGATIVE
SARS Coronavirus 2 by RT PCR: NEGATIVE

## 2021-04-23 IMAGING — CR DG CHEST 2V
2 series · 2 of 2 positions shown · non-contrast
Comparison: Chest x-ray dated [DATE].

CLINICAL DATA: Chest pain and syncope for 3 days.

EXAM:
CHEST - 2 VIEW

[chest lat]
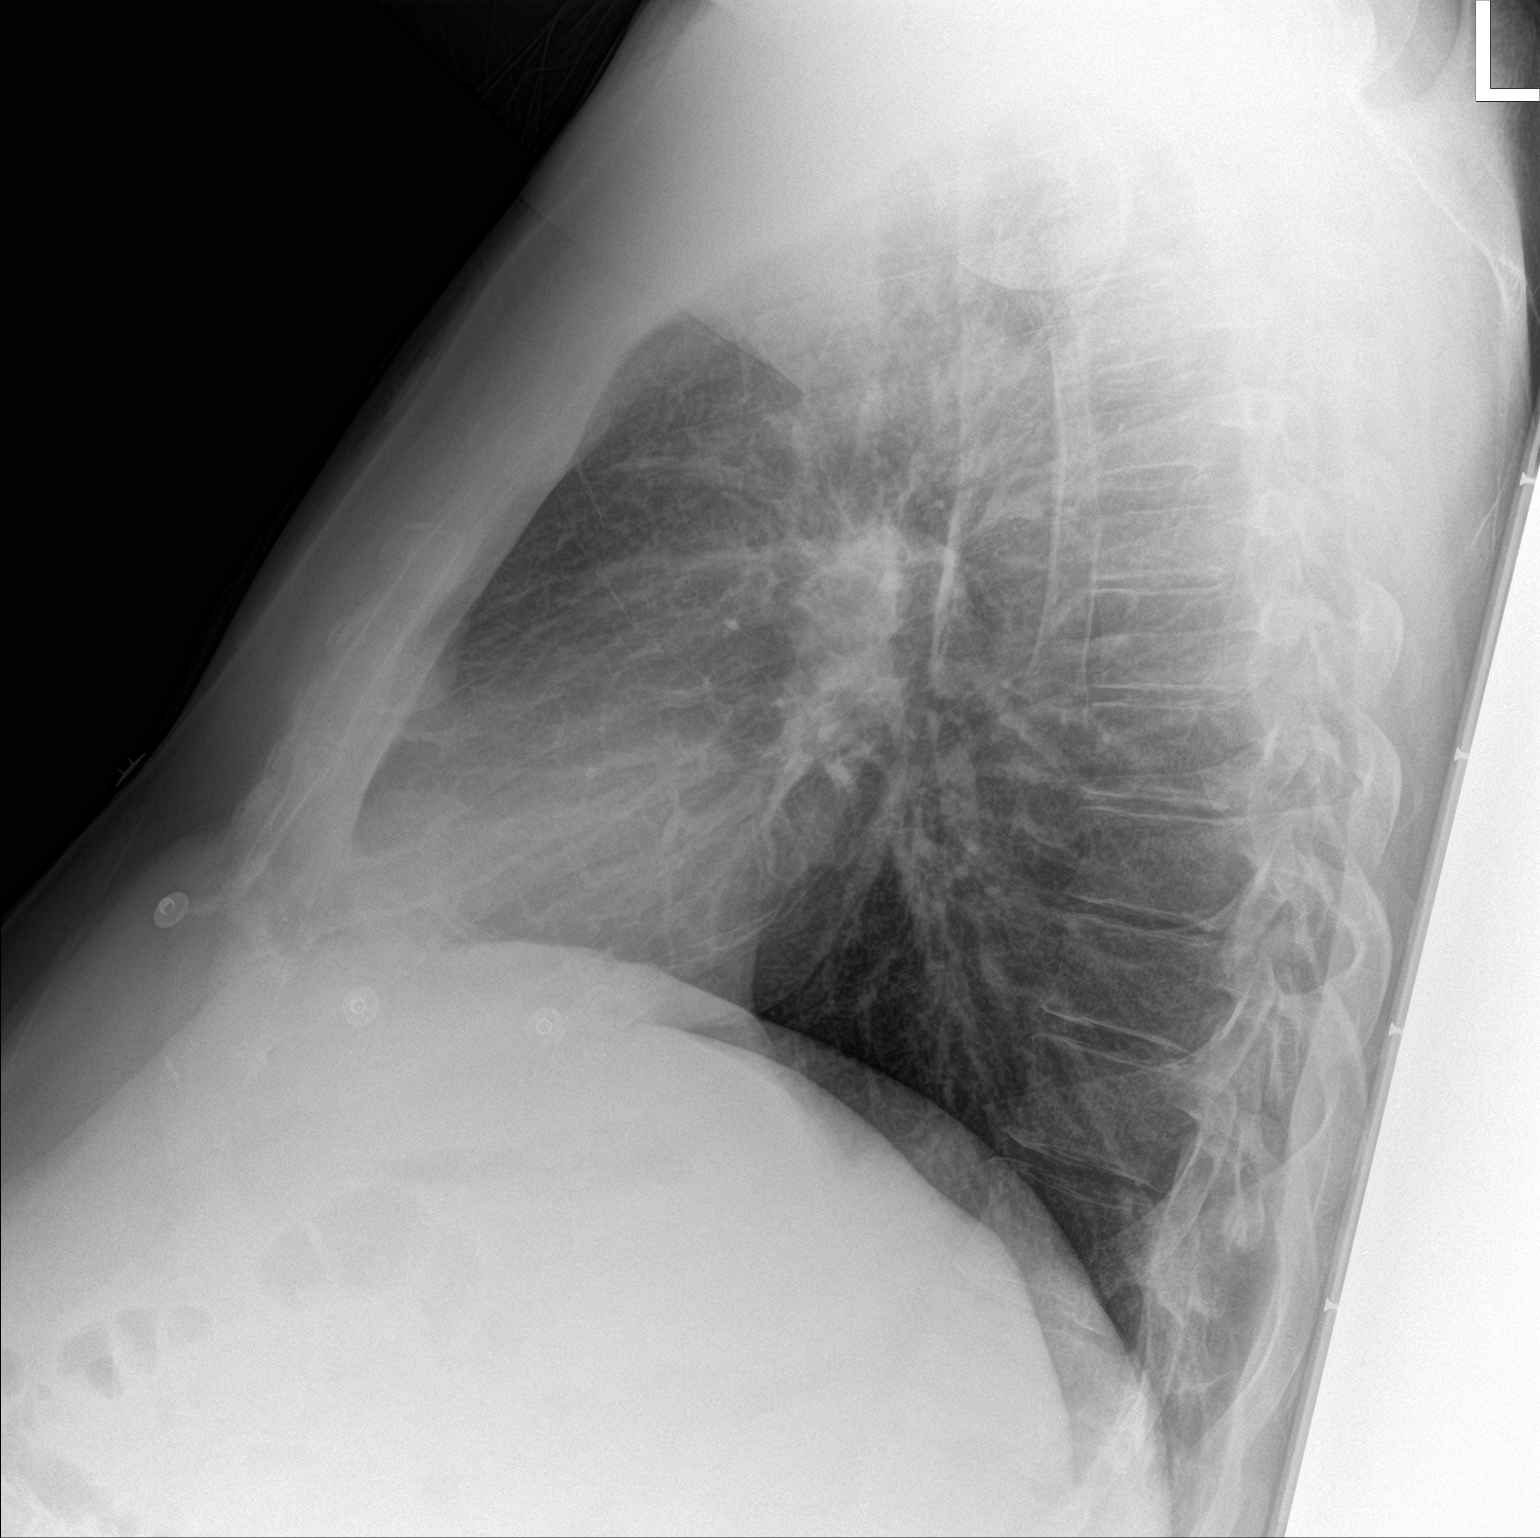

[chest ap]
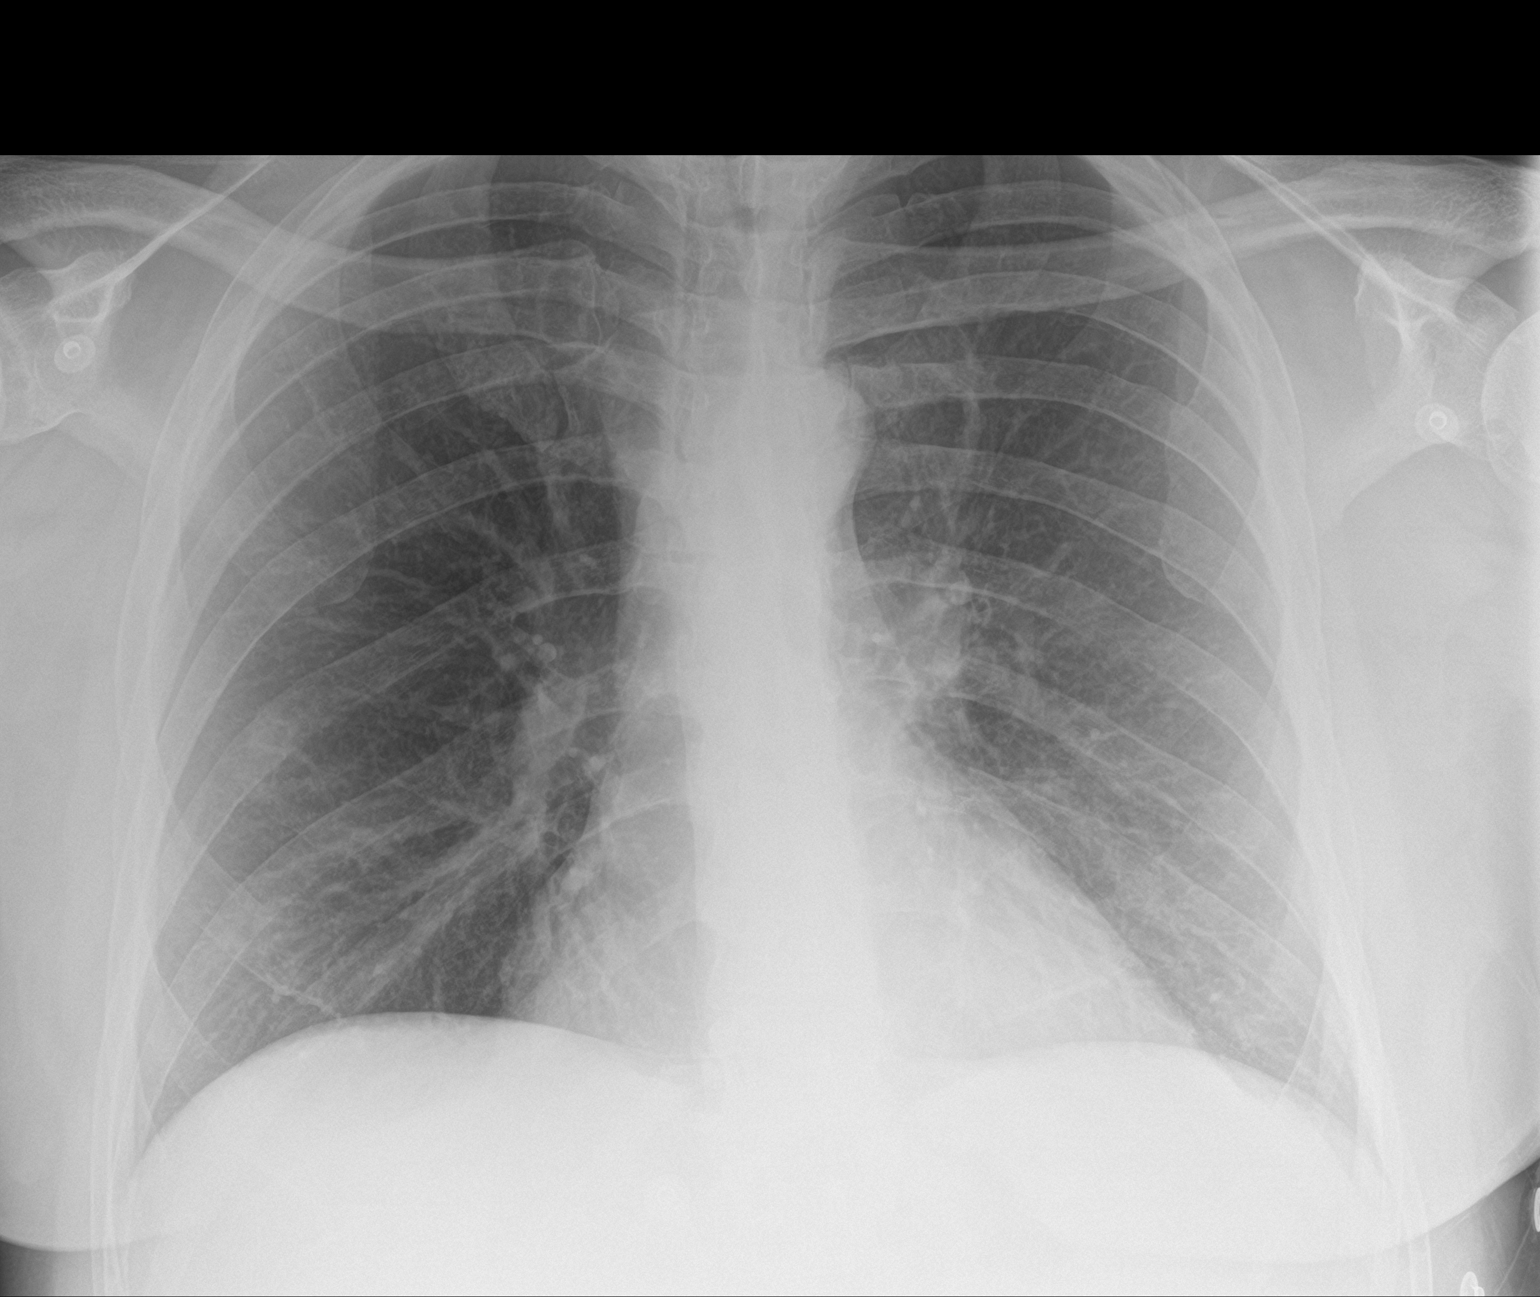

[2 of 2 positions shown; findings below may reference images not displayed]

FINDINGS: Heart size and mediastinal contours are within normal limits. Lungs
are clear. No pleural effusion or pneumothorax is seen. Subacute
(healing) to chronic fracture within the posterior RIGHT eighth rib.
Remainder of the osseous structures about the chest are
unremarkable.
IMPRESSION: 1. No active cardiopulmonary disease. No evidence of pneumonia or
pulmonary edema.
2. Subacute (healing), less likely chronic, fracture within the
posterior RIGHT eighth rib.

## 2021-04-23 MED ORDER — TICAGRELOR 90 MG PO TABS
90.0000 mg | ORAL_TABLET | Freq: Two times a day (BID) | ORAL | Status: DC
  Administered 2021-04-23 – 2021-04-24 (×2): 90 mg via ORAL
  Filled 2021-04-23 (×2): qty 1

## 2021-04-23 MED ORDER — PREGABALIN 25 MG PO CAPS
50.0000 mg | ORAL_CAPSULE | Freq: Two times a day (BID) | ORAL | Status: DC
  Administered 2021-04-23 – 2021-04-24 (×2): 50 mg via ORAL
  Filled 2021-04-23 (×2): qty 2

## 2021-04-23 MED ORDER — TRAZODONE HCL 50 MG PO TABS
300.0000 mg | ORAL_TABLET | Freq: Every day | ORAL | Status: DC
  Administered 2021-04-23: 300 mg via ORAL
  Filled 2021-04-23: qty 6

## 2021-04-23 MED ORDER — NALOXONE HCL 0.4 MG/ML IJ SOLN: 0.4000 mg | INTRAMUSCULAR | Status: DC | PRN

## 2021-04-23 MED ORDER — METOCLOPRAMIDE HCL 5 MG/ML IJ SOLN
10.0000 mg | Freq: Once | INTRAMUSCULAR | Status: AC
  Administered 2021-04-23: 16:00:00 10 mg via INTRAVENOUS
  Filled 2021-04-23: qty 2

## 2021-04-23 MED ORDER — INSULIN DETEMIR 100 UNIT/ML ~~LOC~~ SOLN
10.0000 [IU] | Freq: Every day | SUBCUTANEOUS | Status: DC
Start: 2021-04-24 — End: 2021-04-24
  Administered 2021-04-24: 10 [IU] via SUBCUTANEOUS
  Filled 2021-04-23: qty 0.1

## 2021-04-23 MED ORDER — MORPHINE SULFATE (PF) 4 MG/ML IV SOLN
4.0000 mg | Freq: Once | INTRAVENOUS | Status: AC
  Administered 2021-04-23: 16:00:00 4 mg via INTRAVENOUS
  Filled 2021-04-23: qty 1

## 2021-04-23 MED ORDER — MORPHINE SULFATE (PF) 2 MG/ML IV SOLN
2.0000 mg | INTRAVENOUS | Status: DC | PRN
  Administered 2021-04-23 – 2021-04-24 (×2): 2 mg via INTRAVENOUS
  Filled 2021-04-23 (×3): qty 1

## 2021-04-23 MED ORDER — LITHIUM CARBONATE ER 450 MG PO TBCR
1200.0000 mg | EXTENDED_RELEASE_TABLET | Freq: Every day | ORAL | Status: DC
  Administered 2021-04-23: 23:00:00 1200 mg via ORAL
  Filled 2021-04-23 (×2): qty 1

## 2021-04-23 MED ORDER — DULOXETINE HCL 60 MG PO CPEP
60.0000 mg | ORAL_CAPSULE | Freq: Two times a day (BID) | ORAL | Status: DC
  Administered 2021-04-23 – 2021-04-24 (×2): 60 mg via ORAL
  Filled 2021-04-23 (×2): qty 1

## 2021-04-23 MED ORDER — ACETAMINOPHEN 325 MG PO TABS: 650.0000 mg | ORAL_TABLET | Freq: Four times a day (QID) | ORAL | Status: DC | PRN

## 2021-04-23 MED ORDER — ACETAMINOPHEN 650 MG RE SUPP: 650.0000 mg | Freq: Four times a day (QID) | RECTAL | Status: DC | PRN

## 2021-04-23 MED ORDER — NICOTINE 21 MG/24HR TD PT24
21.0000 mg | MEDICATED_PATCH | Freq: Every day | TRANSDERMAL | Status: DC
  Administered 2021-04-23 – 2021-04-24 (×2): 21 mg via TRANSDERMAL
  Filled 2021-04-23 (×2): qty 1

## 2021-04-23 MED ORDER — CLONAZEPAM 0.5 MG PO TABS
1.0000 mg | ORAL_TABLET | Freq: Once | ORAL | Status: AC
  Administered 2021-04-23: 16:00:00 1 mg via ORAL
  Filled 2021-04-23: qty 2

## 2021-04-23 MED ORDER — ATORVASTATIN CALCIUM 80 MG PO TABS
80.0000 mg | ORAL_TABLET | Freq: Every morning | ORAL | Status: DC
  Administered 2021-04-24: 80 mg via ORAL
  Filled 2021-04-23: qty 1

## 2021-04-23 MED ORDER — ASPIRIN EC 81 MG PO TBEC
81.0000 mg | DELAYED_RELEASE_TABLET | Freq: Every day | ORAL | Status: DC
  Administered 2021-04-24: 81 mg via ORAL
  Filled 2021-04-23: qty 1

## 2021-04-23 MED ORDER — CARVEDILOL 3.125 MG PO TABS
25.0000 mg | ORAL_TABLET | Freq: Two times a day (BID) | ORAL | Status: DC
  Administered 2021-04-24: 25 mg via ORAL
  Filled 2021-04-23: qty 8

## 2021-04-23 MED ORDER — LISINOPRIL 20 MG PO TABS
20.0000 mg | ORAL_TABLET | Freq: Every day | ORAL | Status: DC
  Administered 2021-04-24: 20 mg via ORAL
  Filled 2021-04-23: qty 1

## 2021-04-23 MED ORDER — DIPHENHYDRAMINE HCL 50 MG/ML IJ SOLN
25.0000 mg | Freq: Once | INTRAMUSCULAR | Status: AC
  Administered 2021-04-23: 16:00:00 25 mg via INTRAVENOUS
  Filled 2021-04-23: qty 1

## 2021-04-23 MED ORDER — CLONAZEPAM 0.5 MG PO TABS
1.0000 mg | ORAL_TABLET | Freq: Three times a day (TID) | ORAL | Status: DC | PRN
  Administered 2021-04-24 (×2): 1 mg via ORAL
  Filled 2021-04-23 (×2): qty 2

## 2021-04-23 NOTE — ED Provider Notes (Signed)
[facility] EMERGENCY DEPARTMENT Provider Note   CSN: [id number] Arrival date & time: 04/23/21  1347     History Chief Complaint  Patient presents with   Chest Pain   Loss of Consciousness    Matthew [name] is a 45 y.o. male.  The history is provided by the patient and medical records.  Chest Pain Pain location:  R chest Pain quality: sharp   Pain radiates to:  Does not radiate Pain severity:  Moderate Onset quality:  Sudden Duration:  3 days Timing:  Intermittent Progression:  Unchanged Context: at rest   Relieved by:  Nothing Worsened by:  Coughing Ineffective treatments:  Rest Associated symptoms: cough, shortness of breath and syncope   Associated symptoms: no abdominal pain, no back pain, no fever, no palpitations and no vomiting   Risk factors: coronary artery disease, diabetes mellitus, hypertension and obesity   Loss of Consciousness Episode history:  Single Most recent episode:  Today Timing:  Rare Progression:  Resolved Witnessed: no   Relieved by:  Nothing Worsened by:  Nothing Associated symptoms: chest pain and shortness of breath   Associated symptoms: no fever, no palpitations, no seizures and no vomiting       Past Medical History:  Diagnosis Date   Anxiety    Celiac disease    Coronary artery disease    Dairy allergy    Depression    Diabetes mellitus without complication (HCC)    High cholesterol    Hypertension    Paranoia (HCC)    Pre-diabetes    Schizoaffective disorder (HCC)     Patient Active Problem List   Diagnosis Date Noted   Unstable angina (HCC) 02/24/2021   Right-sided chest pain 09/11/2020   Coronary artery disease of bypass graft of native heart with stable angina pectoris (HCC)    Uncontrolled type 2 diabetes mellitus with hyperglycemia (HCC)    Hyponatremia    Essential hypertension    History of hyperprolactinemia 08/16/2020   Mixed dyslipidemia 08/15/2020   Chest pain 06/06/2020   Type 2  diabetes mellitus with hyperglycemia (HCC) 06/06/2020   Hyperkalemia 06/06/2020   Thrombocytopenia (HCC) 06/06/2020   Obesity (BMI 30.0-34.9) 06/06/2020   Non-ST elevation (NSTEMI) myocardial infarction [facility])    Attention deficit hyperactivity disorder (ADHD), combined type, moderate 03/10/2018   Generalized anxiety disorder 03/10/2018   Schizoaffective disorder, bipolar type without good prognostic features (HCC) 02/09/2015   Morbid obesity (HCC) 01/18/2015   OSA (obstructive sleep apnea) 07/14/2014   Solitary pulmonary nodule 07/14/2014    Past Surgical History:  Procedure Laterality Date   APPENDECTOMY     BRAIN SURGERY     CARDIAC CATHETERIZATION     CORONARY STENT INTERVENTION N/A 06/06/2020   Procedure: CORONARY STENT INTERVENTION;  Surgeon: Swaziland, Peter M, MD;  Location: [facility] INVASIVE CV LAB;  Service: Cardiovascular;  Laterality: N/A;  prox LAD, distal RCA   INTRAVASCULAR ULTRASOUND/IVUS N/A 06/06/2020   Procedure: Intravascular Ultrasound/IVUS;  Surgeon: Swaziland, Peter M, MD;  Location: [facility] INVASIVE CV LAB;  Service: Cardiovascular;  Laterality: N/A;   LEFT HEART CATH AND CORONARY ANGIOGRAPHY N/A 06/06/2020   Procedure: LEFT HEART CATH AND CORONARY ANGIOGRAPHY;  Surgeon: Swaziland, Peter M, MD;  Location: [facility] INVASIVE CV LAB;  Service: Cardiovascular;  Laterality: N/A;   LEFT HEART CATH AND CORONARY ANGIOGRAPHY N/A 02/24/2021   Procedure: LEFT HEART CATH AND CORONARY ANGIOGRAPHY;  Surgeon: Lyn Records, MD;  Location: [facility] INVASIVE CV LAB;  Service: Cardiovascular;  Laterality: N/A;  NASAL SEPTUM SURGERY     WRIST FRACTURE SURGERY     right wrist       Family History  Problem Relation Age of Onset   Asthma Mother    Hypertension Mother    Hypertension Father     Social History   Tobacco Use   Smoking status: Every Day    Packs/day: 0.50    Types: Cigarettes, E-cigarettes   Smokeless tobacco: Never   Tobacco comments:    patient given phone number to 1-800-quit now on  08/16/20  Vaping Use   Vaping Use: Never used  Substance Use Topics   Alcohol use: No    Alcohol/week: 0.0 standard drinks    Comment: no (02/08/15)   Drug use: No    Home Medications Prior to Admission medications   Medication Sig Start Date End Date Taking? Authorizing Provider  perphenazine (TRILAFON) 16 MG tablet TAKE 1 PILL TOTAL 16 MG EVERY MORNING AND TAKE 2 PILLS BY MOUTH TOTAL 32 MG EVERY BEDTIME 04/21/21   Adelene Idler, Dorothea Glassman, PA-C  aspirin EC 81 MG tablet Take 1 tablet (81 mg total) by mouth daily. Swallow whole. 06/07/20   Freada Bergeron, MD  atorvastatin (LIPITOR) 80 MG tablet Take 1 tablet (80 mg total) by mouth every morning. 04/10/21   Freada Bergeron, MD  carvedilol (COREG) 25 MG tablet Take 1 tablet (25 mg total) by mouth 2 (two) times daily. 10/10/20   Freada Bergeron, MD  clonazePAM (KLONOPIN) 1 MG tablet Take 1 tablet (1 mg total) by mouth 3 (three) times daily as needed for anxiety. 04/17/21   Addison Lank, PA-C  DULoxetine (CYMBALTA) 60 MG capsule Take 1 capsule (60 mg total) by mouth 2 (two) times daily. 03/06/21   Addison Lank, PA-C  empagliflozin (JARDIANCE) 25 MG TABS tablet Take 1 tablet (25 mg total) by mouth daily before breakfast. 10/10/20   Freada Bergeron, MD  gabapentin (NEURONTIN) 800 MG tablet 1 po q am, 1/2 po at noon, 1 po at 4 pm, and 1 po at bedtime. Patient not taking: Reported on 03/26/2021 12/07/20   Donnal Moat T, PA-C  icosapent Ethyl (VASCEPA) 1 g capsule Take 2 capsules (2 g total) by mouth 2 (two) times daily. Patient not taking: Reported on 02/24/2021 01/20/21   Supple, Megan E, RPH-CPP  insulin degludec (TRESIBA FLEXTOUCH) 100 UNIT/ML FlexTouch Pen Inject 20 Units into the skin daily. Patient taking differently: Inject 20 Units into the skin daily after breakfast. 11/16/20   Shamleffer, Melanie Crazier, MD  Insulin Pen Needle 32G X 4 MM MISC 1 Device by Does not apply route daily. 08/15/20   Shamleffer, Melanie Crazier, MD   isosorbide mononitrate (IMDUR) 30 MG 24 hr tablet Take 1 tablet (30 mg total) by mouth daily. Patient not taking: Reported on 03/06/2021 02/26/21   Barrett, Evelene Croon, PA-C  lisinopril (ZESTRIL) 20 MG tablet Take 1 tablet (20 mg total) by mouth daily. 06/08/20   Imogene Burn, PA-C  lithium carbonate (LITHOBID) 300 MG CR tablet Take 3 tablets (900 mg total) by mouth at bedtime. Patient taking differently: Take 1,200 mg by mouth at bedtime. 03/27/21   Donnal Moat T, PA-C  metFORMIN (GLUCOPHAGE) 1000 MG tablet TAKE 1 TABLET (1,000 MG TOTAL) BY MOUTH 2 (TWO) TIMES DAILY WITH A MEAL. 01/02/21   Shamleffer, Melanie Crazier, MD  nicotine (NICODERM CQ - DOSED IN MG/24 HOURS) 21 mg/24hr patch Place 1 patch (21 mg total) onto the skin daily. Patient  not taking: Reported on 03/06/2021 02/26/21   Barrett, Joline Salt, PA-C  nitroGLYCERIN (NITROSTAT) 0.4 MG SL tablet Place 1 tablet (0.4 mg total) under the tongue every 5 (five) minutes as needed for chest pain. 08/10/20 08/10/21  Georgie Chard D, NP  oxyCODONE-acetaminophen (PERCOCET) 10-325 MG tablet Take 1 tablet by mouth 5 (five) times daily. 10/20/20   [provider]  pregabalin (LYRICA) 50 MG capsule Take 50 mg by mouth 2 (two) times daily. 02/28/21   [provider]  pseudoephedrine (SUDAFED) 120 MG 12 hr tablet Take 120 mg by mouth every 12 (twelve) hours as needed for congestion. Patient not taking: Reported on 04/17/2021    [provider]  spironolactone (ALDACTONE) 25 MG tablet Take 0.5 tablets (12.5 mg total) by mouth daily. Patient not taking: Reported on 02/24/2021 07/20/20 07/15/21  Meriam Sprague, MD  ticagrelor (BRILINTA) 90 MG TABS tablet Take 1 tablet (90 mg total) by mouth 2 (two) times daily. 06/07/20   Meriam Sprague, MD  traZODone (DESYREL) 100 MG tablet TAKE 3 TABLETS BY MOUTH EVERY DAY AT BEDTIME AS NEEDED FOR SLEEP Patient taking differently: Take 300 mg by mouth at bedtime. 03/06/21   Melony Overly T,  PA-C  Vitamin D, Ergocalciferol, (DRISDOL) 1.25 MG (50000 UNIT) CAPS capsule Take 50,000 Units by mouth every Sunday. 11/28/20   [provider]  zaleplon (SONATA) 10 MG capsule 1 p.o. nightly as needed sleep.  May repeat 1 p.o. for mid nocturnal awakening as long as he has 3 hours or more left to sleep. 04/17/21   Melony Overly T, PA-C    Allergies    Gluten meal and Lactose intolerance (gi)  Review of Systems   Review of Systems  Constitutional:  Negative for chills and fever.  HENT:  Negative for ear pain and sore throat.   Eyes:  Negative for pain and visual disturbance.  Respiratory:  Positive for cough and shortness of breath.   Cardiovascular:  Positive for chest pain and syncope. Negative for palpitations.  Gastrointestinal:  Negative for abdominal pain and vomiting.  Genitourinary:  Negative for dysuria and hematuria.  Musculoskeletal:  Negative for arthralgias and back pain.  Skin:  Negative for color change and rash.  Neurological:  Positive for syncope. Negative for seizures.  All other systems reviewed and are negative.  Physical Exam Updated Vital Signs BP 111/69   Pulse 93   Temp 98.4 F (36.9 C) (Oral)   Resp 20   SpO2 96%   Physical Exam Vitals and nursing note reviewed.  Constitutional:      General: He is not in acute distress.    Appearance: Normal appearance. He is well-developed.  HENT:     Head: Normocephalic and atraumatic.     Right Ear: External ear normal.     Left Ear: External ear normal.     Nose: Nose normal. No congestion.     Mouth/Throat:     Mouth: Mucous membranes are moist.     Pharynx: Oropharynx is clear. No posterior oropharyngeal erythema.  Eyes:     Extraocular Movements: Extraocular movements intact.     Conjunctiva/sclera: Conjunctivae normal.     Pupils: Pupils are equal, round, and reactive to light.  Cardiovascular:     Rate and Rhythm: Normal rate and regular rhythm.     Pulses: Normal pulses.          Radial  pulses are 2+ on the right side and 2+ on the left side.  Heart sounds: No murmur heard. Pulmonary:     Effort: Pulmonary effort is normal. No respiratory distress.     Breath sounds: Normal breath sounds. No wheezing, rhonchi or rales.  Abdominal:     General: Abdomen is flat. Bowel sounds are normal.     Palpations: Abdomen is soft.     Tenderness: There is no abdominal tenderness. There is no guarding or rebound.  Musculoskeletal:        General: No swelling, tenderness or deformity. Normal range of motion.     Cervical back: Normal range of motion and neck supple. No rigidity.  Skin:    General: Skin is warm and dry.     Capillary Refill: Capillary refill takes less than 2 seconds.     Findings: No rash.  Neurological:     General: No focal deficit present.     Mental Status: He is alert and oriented to person, place, and time.  Psychiatric:        Mood and Affect: Mood normal.    ED Results / Procedures / Treatments   Labs (all labs ordered are listed, but only abnormal results are displayed) Labs Reviewed  BASIC METABOLIC PANEL - Abnormal; Notable for the following components:      Result Value   Sodium 129 (*)    CO2 20 (*)    Glucose, Bld 179 (*)    All other components within normal limits  CBC - Abnormal; Notable for the following components:   WBC 17.3 (*)    All other components within normal limits  RESP PANEL BY RT-PCR (FLU A&B, COVID) [id number]  TROPONIN I (HIGH SENSITIVITY)  TROPONIN I (HIGH SENSITIVITY)    EKG None  Radiology DG Chest 2 View  Result Date: 04/23/2021 CLINICAL DATA:  Chest pain and syncope for 3 days. EXAM: CHEST - 2 VIEW COMPARISON:  Chest x-ray dated 04/21/2021. FINDINGS: Heart size and mediastinal contours are within normal limits. Lungs are clear. No pleural effusion or pneumothorax is seen. Subacute (healing) to chronic fracture within the posterior RIGHT eighth rib. Remainder of the osseous structures about the chest are  unremarkable. IMPRESSION: 1. No active cardiopulmonary disease. No evidence of pneumonia or pulmonary edema. 2. Subacute (healing), less likely chronic, fracture within the posterior RIGHT eighth rib. Electronically Signed   By: Franki Cabot M.D.   On: 04/23/2021 15:46   DG Chest 2 View  Result Date: 04/21/2021 CLINICAL DATA:  Cough EXAM: CHEST - 2 VIEW COMPARISON:  03/26/2021 FINDINGS: Lungs are clear.  No pleural effusion or pneumothorax. The heart is normal in size. Visualized osseous structures are within normal limits. IMPRESSION: Normal chest radiographs. Electronically Signed   By: Julian Hy M.D.   On: 04/21/2021 21:08   CT HEAD WO CONTRAST (5MM)  Result Date: 04/23/2021 CLINICAL DATA:  Head trauma, moderate to severe. EXAM: CT HEAD WITHOUT CONTRAST TECHNIQUE: Contiguous axial images were obtained from the base of the skull through the vertex without intravenous contrast. COMPARISON:  July 26, 2014 FINDINGS: Brain: No evidence of acute infarction, hemorrhage, hydrocephalus, extra-axial collection or mass lesion/mass effect. 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 Vascular: No hyperdense vessel or unexpected calcification. Skull: Normal. Negative for fracture or focal lesion. Sinuses/Orbits: No acute finding. Other: None. IMPRESSION: 1. No acute intracranial abnormality. 2. Stable left posterior fossa arachnoid cyst. Electronically Signed   By: Fidela Salisbury M.D.   On: 04/23/2021 16:37   CT Angio Chest PE W and/or Wo Contrast  Result Date: 04/22/2021  CLINICAL DATA:  Cough EXAM: CT ANGIOGRAPHY CHEST WITH CONTRAST TECHNIQUE: Multidetector CT imaging of the chest was performed using the standard protocol during bolus administration of intravenous contrast. Multiplanar CT image reconstructions and MIPs were obtained to evaluate the vascular anatomy. CONTRAST:  40mL OMNIPAQUE IOHEXOL 350 MG/ML SOLN COMPARISON:  Chest x-ray from earlier in the same day. FINDINGS: Cardiovascular:  Thoracic aorta shows atherosclerotic calcifications without aneurysmal dilatation or dissection. Pulmonary artery is well visualized within normal branching pattern. No filling defect to suggest pulmonary embolism is noted. Prior coronary stenting is noted. Mediastinum/Nodes: Thoracic inlet is within normal limits. No sizable hilar or mediastinal adenopathy is noted. The esophagus as visualized is within normal limits. Lungs/Pleura: Lungs are well aerated bilaterally. No focal infiltrate or sizable effusion is seen. Upper Abdomen: Visualized upper abdomen shows fatty infiltration of the liver. Musculoskeletal: Degenerative changes of the thoracic spine are noted. No rib abnormality is noted. Review of the MIP images confirms the above findings. IMPRESSION: No evidence of pulmonary emboli. No acute abnormality noted. Fatty liver. Aortic Atherosclerosis (ICD10-I70.0). Electronically Signed   By: Inez Catalina M.D.   On: 04/22/2021 00:01    Procedures Procedures   Medications Ordered in ED Medications  metoCLOPramide (REGLAN) injection 10 mg (10 mg Intravenous Given 04/23/21 1604)  diphenhydrAMINE (BENADRYL) injection 25 mg (25 mg Intravenous Given 04/23/21 1603)  clonazePAM (KLONOPIN) tablet 1 mg (1 mg Oral Given 04/23/21 1608)  morphine 4 MG/ML injection 4 mg (4 mg Intravenous Given 04/23/21 1604)    ED Course  I have reviewed the triage vital signs and the nursing notes.  Pertinent labs & imaging results that were available during my care of the patient were reviewed by me and considered in my medical decision making (see chart for details).    MDM Rules/Calculators/A&P                           45 year old male presenting with chest pain and syncope.  Seen in the ED 2 days ago for similar complaints.  He is afebrile and hemodynamically stable on arrival.  Mildly tachycardic to the 100s.  He is not tachypneic.  He is in no respiratory distress.  He is satting 100% on room air.  Patient with  syncopal episode earlier today in the setting of a coughing fit.  He notes becoming lightheaded and his vision narrowing prior to passing out.  He did reportedly struck the back of his head when he passed out.  CTA chest from 2 days ago showed no evidence of PE or aortic pathology.  He is not having any new chest pain radiating to back.  He is not hypertensive.  He has no pulse discrepancies on exam.  Low concern for aortic dissection.  He does continue to smoke up to 3 packs a day.  He has a chronic cough.  He is having no infectious symptoms.  He is afebrile here.  Chest x-ray showed no acute infiltrates.  Low suspicion for pneumonia.  In the setting of head trauma and headache the patient on aspirin and Brilinta, will obtain head imaging.  He has no midline cervical spine tenderness.  No radicular symptoms down the arms.  Low concern for cervical injury.  EKG unchanged from prior.  Troponin normal x 2. Low concern for ACS.  Patient reassessed.  Resting comfortably.  Remains hemodynamically stable.  Work-up reviewed.  At this time, recommend admission for further evaluation in the setting of persistent  chest pain and recurrent syncope.  Second ED visit this week.  Would benefit from updated echo and telemetry.  Discussed with cardiology team.  Agree with plan for obs and echo.  Will follow in house. Medicine team contacted for admission.  Final Clinical Impression(s) / ED Diagnoses Final diagnoses:  Chest pain, unspecified type  Syncope, unspecified syncope type    Rx / DC Orders ED Discharge Orders     None        Idamae Lusher, MD 04/23/21 1911    Drenda Freeze, MD 04/23/21 2231

## 2021-04-23 NOTE — ED Triage Notes (Signed)
Pt states he had a syncopal episode and fell and bumped the back of his head.  Reports chest pain since 2am with nausea, vomiting, and SOB.

## 2021-04-23 NOTE — Consult Note (Signed)
Cardiology Admission History and Physical:   Patient ID: Matthew [name] MRN: [id number]; DOB: 09-Jan-1976   Admission date: 04/23/2021  Primary Care Provider: [name], MD [facility] Cardiologist: Meriam Sprague, MD  [facility] Electrophysiologist:  None   Chief Complaint: chest pain   Patient Profile:   Matthew [name] is a 45 y.o. male with severe 2v CAD s/p PCI LAD+RCA (05/2020), ICM/HFrEF with recovery, DM2, HTN, HLD, OSA, ADHD, celiac disease and shizoaffective disorder who presents with recurrent chest pain and syncope.   History of Present Illness:   Matthew [name] was first seen on June 06, 2020 by Dr. Shari Prows with chest pain and troponin elevation (59->224).  Of a 90% distal RCA lesion and 99% pLAD lesion both of which were PCI with DES.  EF was 25-30% with segmental wall motion abnormalities.  He left AMA during that hospitalization and had not had an echo yet.  He was started on aspirin/ticagrelor during hospitalization.  He had follow-up on June 08, 2020 and was still having some SOB and had sinus tachycardia 123.  Followed up again in February in the office and said he could walk around half mile but had to stop with fatigue and felt like his heart was racing with some associated dizziness.  He had a monitor placed which showed average heart rate of 88 with mainly sinus rhythm.  In March he said overall he felt okay but occasionally had some chest discomfort that he described as soreness/tightness on the right side of his chest.  He was still playing on trying to start cardiac rehab but unfortunately continued to smoke.  He is followed by psychiatry for ADD and schizoaffective disorder.  He is on numerous psychiatric medications for these conditions.  In March she was evaluated again by cardiology for atypical chest pain that was not similar to his prior anginal equivalent however troponins were negative.  Evaluated April 2022 with recurrent chest pain and without  any dynamic ECG changes or troponin elevation.  Continue to suspect from that time.  He again left AMA from medical service.  Seen as an outpatient in May 2022 and was having some low back pain and had decreased his smoking to 1 pack/day.  Still had uncontrolled hyper glycemia with average blood 20 those in the 300s.  Denied any anginal equivalents then.  Came to the ED a few days later with chest pain and body aches again negative work-up.  Similar in June with negative work-up.  Seen in July as an outpatient in cardiology clinic with recurrent ED visits and Lexiscan Myoview was ordered for further evaluation.  Fortunately his EF had completely recovered in March although we did not have baseline echo images because he initially left AMA.  Again evaluated February 24, 2021 for recurrent chest pain and dyspnea with no relief of sublingual nitroglycerin or Imdur.  At that time he underwent repeat coronary evaluation which showed patent stents.  Recurrent symptoms given November 6 with negative evaluation.  He presented again to the ED on 12/02 (Friday) with right-sided chest pain, SOB, diaphoresis, and emesis along with a syncopal episode this evening.  He was given aspirin 324 mg and 2 sublingual nitroglycerin prior to arrival with minimal relief.  BG 247.  He said that he was at home and had a coughing fit and then this was followed by syncopal episode.  He thought that he was having palpitations prior to passing out.  He is unsure how long he was out  for and then called EMS with right-sided chest pain and shortness of breath.  He is currently smoking 3 packs of cigarettes per day.  VS in ED (12/02) P 123, BP 118/77, T 98.3, RR 24, O2 92% RA CTPE was negative.  hsT were nl (5->4), ECG with sinus tachycardia (vs AT, baseline artifact makes interpretation difficult). He was discharged home again.   Today (12/04) he again presents with chest pain of 1 day duration and he received aspirin and sublingual  nitroglycerin prior to arrival.  Reported chest pain since 2 AM with associated nausea/vomiting in SOB.  He had a syncopal episode fell and bumped the back of his head.   VS in ED today (12/04) P 102, BP 113/78, T 98.4, O2 94% RA ECG with sinus tachycardia and similar Q waves in inferior and anterolateral leads as prior no new ischemic changes.  CP similar to prior presentations but syncope is new. Last week was in his room and started coughing, he thinks probably just 2/2 smoking. Started coughing for at least 1-2 minutes and then had dizziness and fell back on bed. Complete LOC, no trauma since he fell back on bed. He has been presyncopal before but this was the first time with actual LOC. Friday night happened again, coughing spell and passed out. Really intense coughing while laying in bed and coughed ~3 minutes then got really dizzy and woke up in bed. After he woke up started having chest pain. Today he had another episode around 1600. Stood up from smoking a cigarette and passed out. He was smoking outside on patio of apartment and stood up, felt lightheaded and headed inside towards his room. Made it just inside front door (20 ft walk) and then fell down inside his apartment. Remembers dizziness and then fell backwards. He was trying to sit down but missed the chair and hit back of head on ground. Lives by himself, unsure how long he was down for. Chest pain all day today since 0200. Constant pain, as severe as an 8/10 severity. Waxes/wanes but there all day. Worse with exertion. Still smoking 2-3 ppd. Has tried to get off but can't seem to get rid of them (lozenges, patches and gum he has tried). He thinks his CP is worst at the beginning of the month, worst at the beginning of the month. May be in relationship to stress/bills/rent. Has it a few times monthly. Different than before PCI, before PCI he had "heart was out of sync" but not necessarily pain.   Past Medical History:  Diagnosis Date    Anxiety    Celiac disease    Coronary artery disease    Dairy allergy    Depression    Diabetes mellitus without complication ([place])    High cholesterol    Hypertension    Paranoia ([place])    Pre-diabetes    Schizoaffective disorder ([place])    Past Surgical History:  Procedure Laterality Date   APPENDECTOMY     BRAIN SURGERY     CARDIAC CATHETERIZATION     CORONARY STENT INTERVENTION N/A 06/06/2020   Procedure: CORONARY STENT INTERVENTION;  Surgeon: Martinique, Peter M, MD;  Location: [facility];  Service: Cardiovascular;  Laterality: N/A;  prox LAD, distal RCA   INTRAVASCULAR ULTRASOUND/IVUS N/A 06/06/2020   Procedure: Intravascular Ultrasound/IVUS;  Surgeon: Martinique, Peter M, MD;  Location: [facility];  Service: Cardiovascular;  Laterality: N/A;   LEFT HEART CATH AND CORONARY ANGIOGRAPHY N/A 06/06/2020   Procedure:  LEFT HEART CATH AND CORONARY ANGIOGRAPHY;  Surgeon: SwazilandJordan, Peter M, MD;  [facility];  Service: Cardiovascular;  Laterality: N/A;   LEFT HEART CATH AND CORONARY ANGIOGRAPHY N/A 02/24/2021   Procedure: LEFT HEART CATH AND CORONARY ANGIOGRAPHY;  Surgeon: Lyn RecordsSmith, Henry W, MD;  Location: [facility];  Service: Cardiovascular;  Laterality: N/A;   NASAL SEPTUM SURGERY     WRIST FRACTURE SURGERY     right wrist    Medications Prior to Admission: Prior to Admission medications   Medication Sig Start Date End Date Taking? Authorizing Provider  perphenazine (TRILAFON) 16 MG tablet TAKE 1 PILL TOTAL 16 MG EVERY MORNING AND TAKE 2 PILLS BY MOUTH TOTAL 32 MG EVERY BEDTIME 04/21/21   Claybon JabsHurst, Glade Nurseeresa T, PA-C  aspirin EC 81 MG tablet Take 1 tablet (81 mg total) by mouth daily. Swallow whole. 06/07/20   Meriam SpraguePemberton, Heather E, MD  atorvastatin (LIPITOR) 80 MG tablet Take 1 tablet (80 mg total) by mouth every morning. 04/10/21   Meriam SpraguePemberton, Heather E, MD  carvedilol (COREG) 25 MG tablet Take 1 tablet (25 mg total) by mouth 2 (two) times daily. 10/10/20   Meriam SpraguePemberton,  Heather E, MD  clonazePAM (KLONOPIN) 1 MG tablet Take 1 tablet (1 mg total) by mouth 3 (three) times daily as needed for anxiety. 04/17/21   Cherie OuchHurst, Teresa T, PA-C  DULoxetine (CYMBALTA) 60 MG capsule Take 1 capsule (60 mg total) by mouth 2 (two) times daily. 03/06/21   Cherie OuchHurst, Teresa T, PA-C  empagliflozin (JARDIANCE) 25 MG TABS tablet Take 1 tablet (25 mg total) by mouth daily before breakfast. 10/10/20   Meriam SpraguePemberton, Heather E, MD  gabapentin (NEURONTIN) 800 MG tablet 1 po q am, 1/2 po at noon, 1 po at 4 pm, and 1 po at bedtime. Patient not taking: Reported on 03/26/2021 12/07/20   Melony OverlyHurst, Teresa T, PA-C  icosapent Ethyl (VASCEPA) 1 g capsule Take 2 capsules (2 g total) by mouth 2 (two) times daily. Patient not taking: Reported on 02/24/2021 01/20/21   Supple, Megan E, RPH-CPP  insulin degludec (TRESIBA FLEXTOUCH) 100 UNIT/ML FlexTouch Pen Inject 20 Units into the skin daily. Patient taking differently: Inject 20 Units into the skin daily after breakfast. 11/16/20   Shamleffer, Konrad DoloresIbtehal Jaralla, MD  Insulin Pen Needle 32G X 4 MM MISC 1 Device by Does not apply route daily. 08/15/20   Shamleffer, Konrad DoloresIbtehal Jaralla, MD  isosorbide mononitrate (IMDUR) 30 MG 24 hr tablet Take 1 tablet (30 mg total) by mouth daily. Patient not taking: Reported on 03/06/2021 02/26/21   Barrett, Joline Salthonda G, PA-C  lisinopril (ZESTRIL) 20 MG tablet Take 1 tablet (20 mg total) by mouth daily. 06/08/20   Dyann KiefLenze, Michele M, PA-C  lithium carbonate (LITHOBID) 300 MG CR tablet Take 3 tablets (900 mg total) by mouth at bedtime. Patient taking differently: Take 1,200 mg by mouth at bedtime. 03/27/21   Melony OverlyHurst, Teresa T, PA-C  metFORMIN (GLUCOPHAGE) 1000 MG tablet TAKE 1 TABLET (1,000 MG TOTAL) BY MOUTH 2 (TWO) TIMES DAILY WITH A MEAL. 01/02/21   Shamleffer, Konrad DoloresIbtehal Jaralla, MD  nicotine (NICODERM CQ - DOSED IN MG/24 HOURS) 21 mg/24hr patch Place 1 patch (21 mg total) onto the skin daily. Patient not taking: Reported on 03/06/2021 02/26/21   Barrett,  Joline Salthonda G, PA-C  nitroGLYCERIN (NITROSTAT) 0.4 MG SL tablet Place 1 tablet (0.4 mg total) under the tongue every 5 (five) minutes as needed for chest pain. 08/10/20 08/10/21  Filbert SchilderMcDaniel, Jill D, NP  oxyCODONE-acetaminophen (PERCOCET)  10-325 MG tablet Take 1 tablet by mouth 5 (five) times daily. 10/20/20   [provider]  pregabalin (LYRICA) 50 MG capsule Take 50 mg by mouth 2 (two) times daily. 02/28/21   [provider]  pseudoephedrine (SUDAFED) 120 MG 12 hr tablet Take 120 mg by mouth every 12 (twelve) hours as needed for congestion. Patient not taking: Reported on 04/17/2021    [provider]  spironolactone (ALDACTONE) 25 MG tablet Take 0.5 tablets (12.5 mg total) by mouth daily. Patient not taking: Reported on 02/24/2021 07/20/20 07/15/21  Freada Bergeron, MD  ticagrelor (BRILINTA) 90 MG TABS tablet Take 1 tablet (90 mg total) by mouth 2 (two) times daily. 06/07/20   Freada Bergeron, MD  traZODone (DESYREL) 100 MG tablet TAKE 3 TABLETS BY MOUTH EVERY DAY AT BEDTIME AS NEEDED FOR SLEEP Patient taking differently: Take 300 mg by mouth at bedtime. 03/06/21   Donnal Moat T, PA-C  Vitamin D, Ergocalciferol, (DRISDOL) 1.25 MG (50000 UNIT) CAPS capsule Take 50,000 Units by mouth every Sunday. 11/28/20   [provider]  zaleplon (SONATA) 10 MG capsule 1 p.o. nightly as needed sleep.  May repeat 1 p.o. for mid nocturnal awakening as long as he has 3 hours or more left to sleep. 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, multimedia, unemployed  Tobacco Use   Smoking status: Every Day    Packs/day: 0.50    Types: Cigarettes, E-cigarettes   Smokeless tobacco: Never   Tobacco comments:    patient given phone number to 1-800-quit now on 08/16/20  Vaping Use   Vaping Use: Never used  Substance and Sexual Activity   Alcohol use: No    Alcohol/week: 0.0 standard drinks    Comment: no (02/08/15)   Drug use: No   Sexual activity: Not on file  Other Topics Concern   Not on file  Social History Narrative   Pt lives at the [place] Determinants of Health   Financial Resource Strain: Not on file  Food Insecurity: Not on file  Transportation Needs: Not on file  Physical Activity: Not on file  Stress: Not on file  Social Connections: Not on file  Intimate Partner Violence: Not on file    Family History:   The patient's family history includes Asthma in his mother; Hypertension in his father and mother.    ROS:   Review of Systems: [y] = yes, [ ]  = no   General: Weight gain [ ] ; Weight loss [ ] ; Anorexia [ ] ; Fatigue [ ] ; Fever [ ] ; Chills [ ] ; Weakness [ ]    Cardiac: Chest pain/pressure [y]; Resting SOB [ ] ; Exertional SOB [ ] ; Orthopnea [ ] ; Pedal Edema [ ] ; Palpitations [ ] ; Syncope [y]; Presyncope [ ] ; Paroxysmal nocturnal dyspnea [ ]    Pulmonary: Cough [ ] ; Wheezing [ ] ; Hemoptysis [ ] ; Sputum [ ] ; Snoring [ ]    GI: Vomiting [ ] ; Dysphagia [ ] ;  Melena [ ] ; Hematochezia [ ] ; Heartburn [ ] ; Abdominal pain [ ] ; Constipation [ ] ; Diarrhea [ ] ; BRBPR [ ]    GU: Hematuria [ ] ; Dysuria [ ] ; Nocturia [ ]  Vascular: Pain in legs with walking [ ] ; Pain in feet with lying flat [ ] ; Non-healing sores [ ] ; Stroke [ ] ; TIA [ ] ; Slurred speech [ ] ;   Neuro: Headaches [ ] ; Vertigo [ ] ; Seizures [ ] ; Paresthesias [ ] ;Blurred vision [ ] ; Diplopia [ ] ; Vision changes [ ]    Ortho/Skin: Arthritis [ ] ; Joint pain [ ] ; Muscle pain [ ] ; Joint swelling [ ] ; Back Pain [ ] ; Rash [ ]    Psych: Depression [  ]; Anxiety [ ]    Heme: Bleeding problems [ ] ; Clotting disorders [ ] ; Anemia [ ]    Endocrine: Diabetes [ ] ; Thyroid dysfunction [ ]    Physical Exam/Data:   Vitals:   04/23/21 1745 04/23/21 1800 04/23/21 2015 04/23/21 2338  BP: 114/81 111/69 119/77 (!) 128/99  Pulse: 79 93 98 (!) 104  Resp: (!) 25 20 15  (!) 24  Temp:      TempSrc:      SpO2: 95% 96% 97% 96%   No intake or output data in the 24 hours ending 04/24/21 0156 Last 3 Weights 04/21/2021 04/21/2021 03/26/2021  Weight (lbs) 270 lb 297 lb 9.9 oz 272 lb 7.8 oz  Weight (kg) 122.471 kg 135 kg 123.6 kg     There is no height or weight on file to calculate BMI.  General:  Well nourished, well developed, in no acute distress HEENT: normal Lymph: no adenopathy Neck: no JVD Endocrine:  No thryomegaly Vascular: No carotid bruits; FA pulses 2+ bilaterally without bruits  Cardiac:  normal S1, S2; RRR; no murmur  Lungs:  clear to auscultation bilaterally, no wheezing, rhonchi or rales  Abd: soft, nontender, no hepatomegaly  Ext: no edema Musculoskeletal:  No deformities, BUE and BLE strength normal and equal Skin: warm and dry  Neuro:  CNs 2-12 intact, no focal abnormalities noted Psych:  Normal affect   EKG:  The ECG that was done 04/23/21 (13:56:44) was personally reviewed and demonstrates sinus tach (109), PR 206, QRS 104, Qtc 452, prior anterolateral/inferior Q waves seen, no dynamic changes from prior   Relevant CV Studies:  Coronary angiography Result date: 02/24/21 Normal left main Patent LAD stent with minimal luminal irregularities otherwise. 25% mid circumflex. Dominant right coronary with patent distal RCA stent.  Proximal tandem 25 and 45% stenoses.  Mild to moderate diffuse atherosclerosis noted in the right coronary. Anterior hypokinesis with EF 50%.  LVEDP 7 mmHg.  TTE  Result date: 08/12/20  1. Left ventricular ejection fraction, by estimation, is 65 to 70%. The  left ventricle has normal function. The left  ventricle has no regional  wall motion abnormalities. There is mild left ventricular hypertrophy.  Left ventricular diastolic parameters  were normal.   2. Right ventricular systolic function is normal. The right ventricular  size is normal.   3. The mitral valve is normal in structure. No evidence of mitral valve  regurgitation. No evidence of mitral stenosis.   4. The aortic valve is normal in structure. Aortic valve regurgitation is  not visualized. No aortic stenosis is present.   5. The inferior vena cava is normal in size with greater than 50%  respiratory variability, suggesting right atrial pressure of 3 mmHg.    Laboratory Data:  High Sensitivity Troponin:   Recent Labs  Lab  03/26/21 2014 04/21/21 2053 04/21/21 2221 04/23/21 1417 04/23/21 1615  TROPONINIHS 3 5 4 4 4       Chemistry Recent Labs  Lab 04/21/21 2053 04/23/21 1417  NA 133* 129*  K 4.0 4.1  CL 95* 98  CO2 25 20*  GLUCOSE 214* 179*  BUN 8 11  CREATININE 1.07 1.02  CALCIUM 10.8* 9.6  GFRNONAA >60 >60  ANIONGAP 13 11    No results for input(s): PROT, ALBUMIN, AST, ALT, ALKPHOS, BILITOT in the last 168 hours. Hematology Recent Labs  Lab 04/21/21 2053 04/23/21 1417  WBC 18.3* 17.3*  RBC 5.98* 5.58  HGB 16.9 16.0  HCT 51.3 48.1  MCV 85.8 86.2  MCH 28.3 28.7  MCHC 32.9 33.3  RDW 14.3 14.3  PLT 358 321   BNPNo results for input(s): BNP, PROBNP in the last 168 hours.  DDimer No results for input(s): DDIMER in the last 168 hours.  Radiology/Studies:  DG Chest 2 View  Result Date: 04/23/2021 CLINICAL DATA:  Chest pain and syncope for 3 days. EXAM: CHEST - 2 VIEW COMPARISON:  Chest x-ray dated 04/21/2021. FINDINGS: Heart size and mediastinal contours are within normal limits. Lungs are clear. No pleural effusion or pneumothorax is seen. Subacute (healing) to chronic fracture within the posterior RIGHT eighth rib. Remainder of the osseous structures about the chest are unremarkable. IMPRESSION: 1.  No active cardiopulmonary disease. No evidence of pneumonia or pulmonary edema. 2. Subacute (healing), less likely chronic, fracture within the posterior RIGHT eighth rib. Electronically Signed   By: Franki Cabot M.D.   On: 04/23/2021 15:46   CT HEAD WO CONTRAST (5MM)  Result Date: 04/23/2021 CLINICAL DATA:  Head trauma, moderate to severe. EXAM: CT HEAD WITHOUT CONTRAST TECHNIQUE: Contiguous axial images were obtained from the base of the skull through the vertex without intravenous contrast. COMPARISON:  July 26, 2014 FINDINGS: Brain: No evidence of acute infarction, hemorrhage, hydrocephalus, extra-axial collection or mass lesion/mass effect. 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 Vascular: No hyperdense vessel or unexpected calcification. Skull: Normal. Negative for fracture or focal lesion. Sinuses/Orbits: No acute finding. Other: None. IMPRESSION: 1. No acute intracranial abnormality. 2. Stable left posterior fossa arachnoid cyst. Electronically Signed   By: Fidela Salisbury M.D.   On: 04/23/2021 16:37    HEART score of 2 points with 0.9-1.7% risk of MACE, driven by known risk factors   Assessment and Plan:   Chest pain Syncope  Mr. Lequita Halt has recurrent chest pain.  Has presented on average once monthly if not more often to the emergency department for evaluation.  During his first episode he had a troponin elevation but since then has had no recurrent ECG changes or troponin elevation with each evaluation.  He had repeat coronary angiography 2 months ago which showed patent stents and no source for his chest discomfort.  Now he is having occasional episodes of syncope.  One episode has characteristics of orthostatic neurogenic syncope and other 2 were more consistent with vagally mediated syncope.  His ECG not have any new ischemic changes and troponins ruled out.  He had chest pain since 2 AM yesterday and although it waxed and waned it was fairly constant and not affected with  significant exertion or rest.  He describes it as right-sided chest pain with radiation to the scapula.  Repeat echo is already ordered and he will benefit from outpatient 14-day continuous cardiac telemetry to assess for syncopal episodes and whether there is an arrhythmic component to these.  With recent coronary assessment I do not think that he needs additional evaluation.  Cardiology will assess in the morning for additional recommendations but otherwise he is being observed at night on telemetry and should likely be able to be discharged today (04/24/21).   Severity of Illness: The appropriate patient status for this patient is OBSERVATION. Observation status is judged to be reasonable and necessary in order to provide the required intensity of service to ensure the patient's safety. The patient's presenting symptoms, physical exam findings, and initial radiographic and laboratory data in the context of their medical condition is felt to place them at decreased risk for further clinical deterioration. Furthermore, it is anticipated that the patient will be medically stable for discharge from the hospital within 2 midnights of admission.    For questions or updates, please contact [place] Please consult [email or web address] for contact info under   Signed, Dion Body, MD  04/24/2021 1:56 AM

## 2021-04-23 NOTE — ED Provider Notes (Signed)
Emergency Medicine Provider Triage Evaluation Note  Matthew [name] , a 45 y.o. male  was evaluated in triage.  Pt complains of right-sided chest pain onset today.  His chest pain goes into his shoulder.  He has associated nausea, vomiting, shortness of breath, resolved syncope.  He has tried aspirin and nitroglycern for his symptoms.  He denies abdominal pain, fever, chills.  Review of Systems  Positive: Right-sided chest pain Negative: Abdominal pain  Physical Exam  BP 113/78   Pulse (!) 102   Temp 98.4 F (36.9 C) (Oral)   Resp 18   SpO2 94%  Gen:   Awake, no distress   Resp:  Normal effort  MSK:   Moves extremities without difficulty  Other:  No chest wall tenderness to palpation  Medical Decision Making  Medically screening exam initiated at 2:36 PM.  Appropriate orders placed.  JOSUE FALCONI was informed that the remainder of the evaluation will be completed by another provider, this initial triage assessment does not replace that evaluation, and the importance of remaining in the ED until their evaluation is complete.    [place] A, PA-C 04/23/21 1443    Gerhard Munch, MD 04/23/21 [place]

## 2021-04-23 NOTE — ED Triage Notes (Signed)
Patient arrived by [facility] with complaint of cp x 1 day. Seen here recently for same received asa and 1 sl ntg prior to arrival.

## 2021-04-23 NOTE — H&P (Signed)
History and Physical    PLEASE NOTE THAT DRAGON DICTATION SOFTWARE WAS USED IN THE CONSTRUCTION OF THIS NOTE.   AVEN CHRISTEN ZOX:[id number] DOB: 1976-05-05 DOA: 04/23/2021  PCP: [name], MD  Patient coming from: home   I have personally briefly reviewed patient's old medical records in [place]  Chief Complaint: Right-sided chest pain  HPI: MASAHIRO IGLESIA is a 45 y.o. male with medical history significant for coronary artery disease status post PCI with stents x2 in January 2022, hypertension, hyperlipidemia, type 2 diabetes mellitus complicated by diabetic peripheral polyneuropathy, chronic hyponatremia with baseline sodium 1 28-1 33, who is admitted to [facility] on 04/23/2021 for further evaluation of right-sided chest pain after presenting from home to [facility], ED complaining of such.   Over the last 3 days, the patient ports intermittent right lateral chest discomfort, that he describes as sharp and nonradiating in nature, including no radiation into the back or to between the shoulder blades..  Worsens with cough as well as with deep inspiration.  Nonexertional, nonpositional, not reproducible with direct palpation over the antero-lateral portion of the right chest wall.  He reports associated mild shortness of breath, denies any associated diaphoresis, palpitations, nausea/vomiting.  No orthopnea, PND, new onset peripheral edema.  His cough has been nonproductive, including no associated mopped assist.  No recent calf tenderness or new lower extremity erythema.  No subjective fever, chills, rigors, or generalized myalgias.  Has not taken any sublingual nitroglycerin to assess response and chest pain to this measure.  He notes a level mechanical fall approximately a week ago, in which she landed on the right side of his chest.   He notes a syncopal episode occurring earlier today, associated with preceding dizziness and sensation of imminent loss of consciousness.   He notes that he was coughing directly leading up to this episode of loss of consciousness.  This resulted in the patient falling backwards, striking the back of his head on the floor below.  Unclear duration of loss of consciousness, although the patient believes that it was only for a few seconds.  Not associate with any acute focal weakness, numbness, paresthesias, dysarthria, slurred speech, acute change in vision, dysphagia, vertigo, or headache.   He had presented to [facility], ED with similar right-sided chest pain as well as a similar episode of syncope while coughing, also associated with the above prodrome, 2 days ago.  During the previous ED work-up, troponin x2 nonelevated, EKG without evidence of acute ischemic changes.CTA chest showed no evidence of acute pulmonary embolism, infiltrate, edema, effusion, or pneumothorax.  The patient was subsequently discharged home.  He has a history of known CAD, with left heart cath in January 2022 leading to PCI with stents x2 to the proximal LAD as well as distal RCA.  Subsequent started on daily baby aspirin as well as Brilinta, with which the patient reports good ensuing compliance.  He also notes good compliance with his high density atorvastatin, lisinopril, and Coreg.   Most recent prior echo occurred in March 2022, was notable for LVEF 65 to 70%, no focal wall motion normalities, mild LVH, and normal diastolic parameters, with no evidence of significant valvular pathology.  He subsequently underwent left-sided heart cath in October 2022, which showed no evidence of obstructive CAD, with demonstrating patent stents to the proximal LAD and distal RCA. He follows with Dr. Johney Frame as his outpatient cardiologist.       ED Course:  Vital signs in  the ED were notable for the following: Afebrile; heart rate 79 104; blood pressure 114/81 -128/92, respiratory rate 19-22, oxygen saturation 96 to 100% on room air  Labs were notable for the following:  BMP notable for the following: Sodium 129, which corrects to proximately 130.5 hyperglycemia, potassium 4.1 creatinine 1.02 compared to 1.07 on 04/21/2021.  Associated troponin I x2 without before.  Blood cell count 17,300, hemoglobin 16.  COVID-19/influenza PCR negative.  Imaging and additional notable ED work-up: EKG showed sinus tachycardia with heart rate 109, no evidence of T wave changes, and showed less than 1 mm ST elevation in inferior leads, unchanged from most recent to prior EKGs, which were performed on 04/21/2020 03/27/2021.  2 view chest x-ray showed no evidence of acute cardiopulmonary process conclusion no evidence of infiltrate, edema, effusion, or pneumothorax, while demonstrating evidence of a subacute nondisplaced fracture within the posterior right eighth rib.  CT head showed no evidence of acute intracranial process, including no evidence of intracranial hemorrhage.  EDP discussed the patient's case with the on-call cardiologist, [place], who recommends observation to the hospitalist service for further evaluation management of his presenting chest pain/syncope in the context of the patient's known history of CAD.  Dr. Renella Cunas plans to formally consult, and requests echocardiogram as component of work-up.  While in the ED, the following were administered: Clonazepam 1 mg p.o. x1, Reglan 10 mg IV x1, morphine 4 mg IV x1.     Review of Systems: As per HPI otherwise 10 point review of systems negative.   Past Medical History:  Diagnosis Date   Anxiety    Celiac disease    Coronary artery disease    Dairy allergy    Depression    Diabetes mellitus without complication ([place])    High cholesterol    Hypertension    Paranoia ([place])    Pre-diabetes    Schizoaffective disorder ([place])     Past Surgical History:  Procedure Laterality Date   APPENDECTOMY     BRAIN SURGERY     CARDIAC CATHETERIZATION     CORONARY STENT INTERVENTION N/A 06/06/2020   Procedure: CORONARY STENT  INTERVENTION;  Surgeon: Martinique, Peter M, MD;  Location: [place] [facility];  Service: Cardiovascular;  Laterality: N/A;  prox LAD, distal RCA   INTRAVASCULAR ULTRASOUND/IVUS N/A 06/06/2020   Procedure: Intravascular Ultrasound/IVUS;  Surgeon: Martinique, Peter M, MD;  Location: [place] [facility];  Service: Cardiovascular;  Laterality: N/A;   LEFT HEART CATH AND CORONARY ANGIOGRAPHY N/A 06/06/2020   Procedure: LEFT HEART CATH AND CORONARY ANGIOGRAPHY;  Surgeon: Martinique, Peter M, MD;  Location: [place] [facility];  Service: Cardiovascular;  Laterality: N/A;   LEFT HEART CATH AND CORONARY ANGIOGRAPHY N/A 02/24/2021   Procedure: LEFT HEART CATH AND CORONARY ANGIOGRAPHY;  Surgeon: Belva Crome, MD;  Location: [place] [facility];  Service: Cardiovascular;  Laterality: N/A;   NASAL SEPTUM SURGERY     WRIST FRACTURE SURGERY     right wrist    Social History:  reports that he has been smoking cigarettes and e-cigarettes. He has been smoking an average of .5 packs per day. He has never used smokeless tobacco. He reports that he does not drink alcohol and does not use drugs.   Allergies  Allergen Reactions   Gluten Meal Other (See Comments)    Celiac disease   Lactose Intolerance (Gi) Diarrhea    Can tolerate hard cheese    Family History  Problem Relation Age of Onset  Asthma Mother    Hypertension Mother    Hypertension Father     Family history reviewed and not pertinent    Prior to Admission medications   Medication Sig Start Date End Date Taking? Authorizing Provider  perphenazine (TRILAFON) 16 MG tablet TAKE 1 PILL TOTAL 16 MG EVERY MORNING AND TAKE 2 PILLS BY MOUTH TOTAL 32 MG EVERY BEDTIME 04/21/21   Adelene Idler, Dorothea Glassman, PA-C  aspirin EC 81 MG tablet Take 1 tablet (81 mg total) by mouth daily. Swallow whole. 06/07/20   Freada Bergeron, MD  atorvastatin (LIPITOR) 80 MG tablet Take 1 tablet (80 mg total) by mouth every morning. 04/10/21   Freada Bergeron, MD  carvedilol (COREG) 25  MG tablet Take 1 tablet (25 mg total) by mouth 2 (two) times daily. 10/10/20   Freada Bergeron, MD  clonazePAM (KLONOPIN) 1 MG tablet Take 1 tablet (1 mg total) by mouth 3 (three) times daily as needed for anxiety. 04/17/21   Addison Lank, PA-C  DULoxetine (CYMBALTA) 60 MG capsule Take 1 capsule (60 mg total) by mouth 2 (two) times daily. 03/06/21   Addison Lank, PA-C  empagliflozin (JARDIANCE) 25 MG TABS tablet Take 1 tablet (25 mg total) by mouth daily before breakfast. 10/10/20   Freada Bergeron, MD  gabapentin (NEURONTIN) 800 MG tablet 1 po q am, 1/2 po at noon, 1 po at 4 pm, and 1 po at bedtime. Patient not taking: Reported on 03/26/2021 12/07/20   Donnal Moat T, PA-C  icosapent Ethyl (VASCEPA) 1 g capsule Take 2 capsules (2 g total) by mouth 2 (two) times daily. Patient not taking: Reported on 02/24/2021 01/20/21   Supple, Megan E, RPH-CPP  insulin degludec (TRESIBA FLEXTOUCH) 100 UNIT/ML FlexTouch Pen Inject 20 Units into the skin daily. Patient taking differently: Inject 20 Units into the skin daily after breakfast. 11/16/20   Shamleffer, Melanie Crazier, MD  Insulin Pen Needle 32G X 4 MM MISC 1 Device by Does not apply route daily. 08/15/20   Shamleffer, Melanie Crazier, MD  isosorbide mononitrate (IMDUR) 30 MG 24 hr tablet Take 1 tablet (30 mg total) by mouth daily. Patient not taking: Reported on 03/06/2021 02/26/21   Barrett, Evelene Croon, PA-C  lisinopril (ZESTRIL) 20 MG tablet Take 1 tablet (20 mg total) by mouth daily. 06/08/20   Imogene Burn, PA-C  lithium carbonate (LITHOBID) 300 MG CR tablet Take 3 tablets (900 mg total) by mouth at bedtime. Patient taking differently: Take 1,200 mg by mouth at bedtime. 03/27/21   Donnal Moat T, PA-C  metFORMIN (GLUCOPHAGE) 1000 MG tablet TAKE 1 TABLET (1,000 MG TOTAL) BY MOUTH 2 (TWO) TIMES DAILY WITH A MEAL. 01/02/21   Shamleffer, Melanie Crazier, MD  nicotine (NICODERM CQ - DOSED IN MG/24 HOURS) 21 mg/24hr patch Place 1 patch (21 mg  total) onto the skin daily. Patient not taking: Reported on 03/06/2021 02/26/21   Barrett, Evelene Croon, PA-C  nitroGLYCERIN (NITROSTAT) 0.4 MG SL tablet Place 1 tablet (0.4 mg total) under the tongue every 5 (five) minutes as needed for chest pain. 08/10/20 08/10/21  Kathyrn Drown D, NP  oxyCODONE-acetaminophen (PERCOCET) 10-325 MG tablet Take 1 tablet by mouth 5 (five) times daily. 10/20/20   [provider]  pregabalin (LYRICA) 50 MG capsule Take 50 mg by mouth 2 (two) times daily. 02/28/21   [provider]  pseudoephedrine (SUDAFED) 120 MG 12 hr tablet Take 120 mg by mouth every 12 (twelve) hours as needed for congestion. Patient  not taking: Reported on 04/17/2021    [provider]  spironolactone (ALDACTONE) 25 MG tablet Take 0.5 tablets (12.5 mg total) by mouth daily. Patient not taking: Reported on 02/24/2021 07/20/20 07/15/21  Freada Bergeron, MD  ticagrelor (BRILINTA) 90 MG TABS tablet Take 1 tablet (90 mg total) by mouth 2 (two) times daily. 06/07/20   Freada Bergeron, MD  traZODone (DESYREL) 100 MG tablet TAKE 3 TABLETS BY MOUTH EVERY DAY AT BEDTIME AS NEEDED FOR SLEEP Patient taking differently: Take 300 mg by mouth at bedtime. 03/06/21   Donnal Moat T, PA-C  Vitamin D, Ergocalciferol, (DRISDOL) 1.25 MG (50000 UNIT) CAPS capsule Take 50,000 Units by mouth every Sunday. 11/28/20   [provider]  zaleplon (SONATA) 10 MG capsule 1 p.o. nightly as needed sleep.  May repeat 1 p.o. for mid nocturnal awakening as long as he has 3 hours or more left to sleep. 04/17/21   Addison Lank, PA-C     Objective    Physical Exam: Vitals:   04/23/21 1615 04/23/21 1740 04/23/21 1745 04/23/21 1800  BP: 118/85 120/77 114/81 111/69  Pulse: 100 97 79 93  Resp: 19 19 (!) 25 20  Temp:      TempSrc:      SpO2: 96% 97% 95% 96%    General: appears to be stated age; alert, oriented Skin: warm, dry, no rash Head:  AT/[place] Mouth:  Oral mucosa membranes appear  moist, normal dentition Neck: supple; trachea midline Heart:  RRR; did not appreciate any M/R/G Lungs: CTAB, did not appreciate any wheezes, rales, or rhonchi Abdomen: + BS; soft, ND, NT Vascular: 2+ pedal pulses b/l; 2+ radial pulses b/l Extremities: no peripheral edema, no muscle wasting Neuro: strength and sensation intact in upper and lower extremities b/l   Labs on Admission: I have personally reviewed following labs and imaging studies  CBC: Recent Labs  Lab 04/21/21 2053 04/23/21 1417  WBC 18.3* 17.3*  NEUTROABS 12.3*  --   HGB 16.9 16.0  HCT 51.3 48.1  MCV 85.8 86.2  PLT 358 202   Basic Metabolic Panel: Recent Labs  Lab 04/21/21 2053 04/23/21 1417  NA 133* 129*  K 4.0 4.1  CL 95* 98  CO2 25 20*  GLUCOSE 214* 179*  BUN 8 11  CREATININE 1.07 1.02  CALCIUM 10.8* 9.6   GFR: Estimated Creatinine Clearance: 131.7 mL/min (by C-G formula based on SCr of 1.02 mg/dL). Liver Function Tests: No results for input(s): AST, ALT, ALKPHOS, BILITOT, PROT, ALBUMIN in the last 168 hours. No results for input(s): LIPASE, AMYLASE in the last 168 hours. No results for input(s): AMMONIA in the last 168 hours. Coagulation Profile: No results for input(s): INR, PROTIME in the last 168 hours. Cardiac Enzymes: No results for input(s): CKTOTAL, CKMB, CKMBINDEX, TROPONINI in the last 168 hours. BNP (last 3 results) No results for input(s): PROBNP in the last 8760 hours. HbA1C: No results for input(s): HGBA1C in the last 72 hours. CBG: No results for input(s): GLUCAP in the last 168 hours. Lipid Profile: No results for input(s): CHOL, HDL, LDLCALC, TRIG, CHOLHDL, LDLDIRECT in the last 72 hours. Thyroid Function Tests: No results for input(s): TSH, T4TOTAL, FREET4, T3FREE, THYROIDAB in the last 72 hours. Anemia Panel: No results for input(s): VITAMINB12, FOLATE, FERRITIN, TIBC, IRON, RETICCTPCT in the last 72 hours. Urine analysis:    Component Value Date/Time   COLORURINE  YELLOW 09/12/2020 0021   APPEARANCEUR CLEAR 09/12/2020 0021   LABSPEC 1.020 09/12/2020 0021  PHURINE 6.0 09/12/2020 0021   GLUCOSEU >=500 (A) 09/12/2020 0021   HGBUR NEGATIVE 09/12/2020 0021   BILIRUBINUR NEGATIVE 09/12/2020 0021   KETONESUR NEGATIVE 09/12/2020 0021   PROTEINUR NEGATIVE 09/12/2020 0021   NITRITE NEGATIVE 09/12/2020 0021   LEUKOCYTESUR NEGATIVE 09/12/2020 0021    Radiological Exams on Admission: DG Chest 2 View  Result Date: 04/23/2021 CLINICAL DATA:  Chest pain and syncope for 3 days. EXAM: CHEST - 2 VIEW COMPARISON:  Chest x-ray dated 04/21/2021. FINDINGS: Heart size and mediastinal contours are within normal limits. Lungs are clear. No pleural effusion or pneumothorax is seen. Subacute (healing) to chronic fracture within the posterior RIGHT eighth rib. Remainder of the osseous structures about the chest are unremarkable. IMPRESSION: 1. No active cardiopulmonary disease. No evidence of pneumonia or pulmonary edema. 2. Subacute (healing), less likely chronic, fracture within the posterior RIGHT eighth rib. Electronically Signed   By: Franki Cabot M.D.   On: 04/23/2021 15:46   DG Chest 2 View  Result Date: 04/21/2021 CLINICAL DATA:  Cough EXAM: CHEST - 2 VIEW COMPARISON:  03/26/2021 FINDINGS: Lungs are clear.  No pleural effusion or pneumothorax. The heart is normal in size. Visualized osseous structures are within normal limits. IMPRESSION: Normal chest radiographs. Electronically Signed   By: Julian Hy M.D.   On: 04/21/2021 21:08   CT HEAD WO CONTRAST (5MM)  Result Date: 04/23/2021 CLINICAL DATA:  Head trauma, moderate to severe. EXAM: CT HEAD WITHOUT CONTRAST TECHNIQUE: Contiguous axial images were obtained from the base of the skull through the vertex without intravenous contrast. COMPARISON:  July 26, 2014 FINDINGS: Brain: No evidence of acute infarction, hemorrhage, hydrocephalus, extra-axial collection or mass lesion/mass effect. Left posterior fossa  arachnoid cyst again seen. Vascular: No hyperdense vessel or unexpected calcification. Skull: Normal. Negative for fracture or focal lesion. Sinuses/Orbits: No acute finding. Other: None. IMPRESSION: 1. No acute intracranial abnormality. 2. Stable left posterior fossa arachnoid cyst. Electronically Signed   By: Fidela Salisbury M.D.   On: 04/23/2021 16:37   CT Angio Chest PE W and/or Wo Contrast  Result Date: 04/22/2021 CLINICAL DATA:  Cough EXAM: CT ANGIOGRAPHY CHEST WITH CONTRAST TECHNIQUE: Multidetector CT imaging of the chest was performed using the standard protocol during bolus administration of intravenous contrast. Multiplanar CT image reconstructions and MIPs were obtained to evaluate the vascular anatomy. CONTRAST:  60m OMNIPAQUE IOHEXOL 350 MG/ML SOLN COMPARISON:  Chest x-ray from earlier in the same day. FINDINGS: Cardiovascular: Thoracic aorta shows atherosclerotic calcifications without aneurysmal dilatation or dissection. Pulmonary artery is well visualized within normal branching pattern. No filling defect to suggest pulmonary embolism is noted. Prior coronary stenting is noted. Mediastinum/Nodes: Thoracic inlet is within normal limits. No sizable hilar or mediastinal adenopathy is noted. The esophagus as visualized is within normal limits. Lungs/Pleura: Lungs are well aerated bilaterally. No focal infiltrate or sizable effusion is seen. Upper Abdomen: Visualized upper abdomen shows fatty infiltration of the liver. Musculoskeletal: Degenerative changes of the thoracic spine are noted. No rib abnormality is noted. Review of the MIP images confirms the above findings. IMPRESSION: No evidence of pulmonary emboli. No acute abnormality noted. Fatty liver. Aortic Atherosclerosis (ICD10-I70.0). Electronically Signed   By: MInez CatalinaM.D.   On: 04/22/2021 00:01     EKG: Independently reviewed, with result as described above.    Assessment/Plan   Principal Problem:   Atypical chest  pain Active Problems:   Generalized anxiety disorder   Type 2 diabetes mellitus with hyperglycemia (HCC)   Essential hypertension  Syncope   Chronic hyponatremia   Leukocytosis   Right rib fracture   High cholesterol   Tobacco abuse      #) Atypical, right-sided chest pain: Intermittent right anterior lateral chest pain for the last 3 to 4 days, which is been nonexertional, but rather pleuritic in nature, which appears atypical for ACS.  However, in the setting of 2 episodes of syncope over that timeframe, as well as a known history of CAD, patient to be admitted for overnight observation for further evaluation management, including ACS rule out.  Of note, high-sensitivity troponin x2 nonelevated, while EKG shows no evidence of acute ischemic changes relative to prior.  Chest x-ray showed no evidence of acute cardiopulmonary process, but did show evidence of subacute fracture, nondisplaced, within the posterior right eighth rib, potentially contributing to his right-sided pleuritic chest discomfort following a preceding ground-level mechanical fall in which he fell on his right side, as above.  Of note, acute pulmonary embolism unlikely after CTA chest on 04/21/2021 demonstrated no evidence of acute PE, and no evidence of acute pulmonary process.  Cardiology consulted, as above, with plan for formal consult, and minimal recommendation for echocardiogram.  Patient is currently chest pain-free.  Plan: Continue to trend troponin.  Echocardiogram ordered.  Monitor on telemetry.  Resume outpatient aspirin, Brilinta, high intensity atorvastatin, beta-blocker, lisinopril.  Prn IV morphine.  Cardiology consulted, as above.  Add on serum magnesium level.  This is from a tree.  Repeat CBC/CMP in the morning.  Check lipase.  UDS.      #) Syncope: 2 episode of syncope over the last 3 days that appear to be related to proceeding Coughing and associated with prodrome, thereby decreasing likelihood of  associated ventricular arrhythmia.  Differential includes orthostatic hypotension, particularly given increased risk for development of such in the setting of outpatient medications including beta-blockers and vasodilator.  ACS less likely, as above, without further evaluating for such in the setting of presenting right-sided chest pain. CTA chest on 04/22/2019 showed no evidence of acute PE.  No evidence of acute focal neurologic deficit, and CT head showed no evidence of acute intracranial process, including no evidence of intracranial hemorrhage.  Overall, acute ischemic CVA versus seizures appear less likely.    Plan: I have placed a nursing communication order requesting that orthostatic vital signs x 1 set be checked and documented. Monitor on telemetry.  Trending troponin.  Echocardiogram ordered for the morning.   Monitor strict I's and O's.  Add-on serum Mg level. Check CMP, CBC, serum Mg level in the AM. Fall precautions ordered.       #) Chronic hypoosmolar hyponatremia: Associated with baseline serum sodium range of 1 28-1 33 and back to at least January 2022, presenting corrected serum sodium consistent with this range.  Given chronicity of these low sodium values, suspect pharmacologic contributions from the patient's multiple outpatient psychiatric meds.  Plan: Monitor strict I's and O's Daily weights.  Repeat CMP in the morning.       #) Leukocytosis: Mildly elevated white cell count of 70,300 today's labs.  Suspect this is inflammatory/reactive in the context of recent episode of syncope with Xitlalic Maslin, striking his head, as above.  No evidence to suggest underlying infectious process at this time, including chest x-ray showing no evidence of pneumonia, will COVID-19/influenza PCR negative.  Also check urinalysis to further evaluate. In the absence of any suspect underlying infectious source, criteria for sepsis not currently met.  Plan: Check urinalysis.  Repeat CBC  with  differential in the morning.  CMP in the a.m.  Fall precautions.      #) Posterior right eighth rib fracture: Identified on today's chest x-ray, to be subacute in nature, without evidence of displacement.  Appears to be isolated to the right eighth rib, and potentially contributory to the patient's recent intermittent right-sided pleuritic chest discomfort, as above.  No evidence of acute respiratory distress, and no evidence of associated pneumothorax.  Plan: Incentive spirometry.  Prn IV morphine.  Repeat CBC in the morning.  Further evaluation management presenting right-sided chest discomfort, as above.     #) generalized anxiety disorder: Documented history of such, with outpatient medications notable for Cymbalta in prn clonazepam.  Plan: Continue home Cymbalta and prn clonazepam.         #) Type 2 Diabetes Mellitus: documented history of such. Home insulin regimen: Tresiba 20 units subcu every morning. Home oral hypoglycemic agents: Metformin, empaglflozin. presenting blood sugar 179.   Plan: accuchecks QAC and HS with sliding scale insulin.  Hold home oral hypoglycemic agents during this hospitalization.  In terms of initial basal insulin dose during his hospitalization, will start with Tresiba 10 units subcu every morning, representing half dose relative to outpatient basal insulin use.      #) Hyperlipidemia: On high intensity atorvastatin as an outpatient.  Plan: Continue home statin.       #) Chronic tobacco abuse: The patient reports that he is a current smoker, typically smoking between half pack per day to 3 packs/day, and has been smoking within this range for several years now.  Plan: Counseled patient on the importance of complete smoking discontinuation, particularly in the setting of his known history of underlying coronary artery disease.    DVT prophylaxis: SCD's   Code Status: Full code Family Communication: none Disposition Plan: Per Rounding  Team Consults called:; Dr. Renella Cunas of cardiology consulted, as further detailed above;  Admission status: obs; cardiac tele   PLEASE NOTE THAT DRAGON DICTATION SOFTWARE WAS USED IN THE CONSTRUCTION OF THIS NOTE.   [place] DO [facility]  From [place]   04/23/2021, 7:19 PM

## 2021-04-24 ENCOUNTER — Ambulatory Visit (INDEPENDENT_AMBULATORY_CARE_PROVIDER_SITE_OTHER): Payer: Medicaid Other

## 2021-04-24 ENCOUNTER — Other Ambulatory Visit: Payer: Self-pay | Admitting: Physician Assistant

## 2021-04-24 ENCOUNTER — Observation Stay (HOSPITAL_BASED_OUTPATIENT_CLINIC_OR_DEPARTMENT_OTHER): Payer: Medicaid Other

## 2021-04-24 DIAGNOSIS — R079 Chest pain, unspecified: Secondary | ICD-10-CM

## 2021-04-24 DIAGNOSIS — R55 Syncope and collapse: Secondary | ICD-10-CM

## 2021-04-24 DIAGNOSIS — R0789 Other chest pain: Secondary | ICD-10-CM | POA: Diagnosis not present

## 2021-04-24 LAB — COMPREHENSIVE METABOLIC PANEL
ALT: 53 U/L — ABNORMAL HIGH (ref 0–44)
AST: 36 U/L (ref 15–41)
Albumin: 4.4 g/dL (ref 3.5–5.0)
Alkaline Phosphatase: 93 U/L (ref 38–126)
Anion gap: 11 (ref 5–15)
BUN: 11 mg/dL (ref 6–20)
CO2: 21 mmol/L — ABNORMAL LOW (ref 22–32)
Calcium: 9.5 mg/dL (ref 8.9–10.3)
Chloride: 95 mmol/L — ABNORMAL LOW (ref 98–111)
Creatinine, Ser: 1.15 mg/dL (ref 0.61–1.24)
GFR, Estimated: >60 mL/min (ref >60)
Glucose, Bld: 190 mg/dL — ABNORMAL HIGH (ref 70–99)
Potassium: 4 mmol/L (ref 3.5–5.1)
Sodium: 127 mmol/L — ABNORMAL LOW (ref 135–145)
Total Bilirubin: 1.1 mg/dL (ref 0.3–1.2)
Total Protein: 7.6 g/dL (ref 6.5–8.1)

## 2021-04-24 LAB — ECHOCARDIOGRAM COMPLETE
Area-P 1/2: 5.13 cm2
Calc EF: 49.3 %
S' Lateral: 2.9 cm
Single Plane A2C EF: 44.7 %
Single Plane A4C EF: 56.1 %

## 2021-04-24 LAB — CBC
HCT: 50 % (ref 39.0–52.0)
Hemoglobin: 16.7 g/dL (ref 13.0–17.0)
MCH: 28.7 pg (ref 26.0–34.0)
MCHC: 33.4 g/dL (ref 30.0–36.0)
MCV: 86.1 fL (ref 80.0–100.0)
Platelets: 306 10*3/uL (ref 150–400)
RBC: 5.81 MIL/uL (ref 4.22–5.81)
RDW: 14.4 % (ref 11.5–15.5)
WBC: 17 10*3/uL — ABNORMAL HIGH (ref 4.0–10.5)
nRBC: 0 % (ref 0.0–0.2)

## 2021-04-24 LAB — TROPONIN I (HIGH SENSITIVITY): Troponin I (High Sensitivity): 5 ng/L (ref <18)

## 2021-04-24 LAB — LIPASE, BLOOD: Lipase: 33 U/L (ref 11–51)

## 2021-04-24 LAB — PROCALCITONIN: Procalcitonin: <0.1 ng/mL

## 2021-04-24 LAB — MAGNESIUM: Magnesium: 2.3 mg/dL (ref 1.7–2.4)

## 2021-04-24 MED ORDER — OXYCODONE-ACETAMINOPHEN 10-325 MG PO TABS: 1.0000 | ORAL_TABLET | Freq: Every day | ORAL | Status: DC

## 2021-04-24 MED ORDER — OXYCODONE-ACETAMINOPHEN 5-325 MG PO TABS
1.0000 | ORAL_TABLET | Freq: Every day | ORAL | Status: DC
  Administered 2021-04-24: 1 via ORAL
  Filled 2021-04-24: qty 1

## 2021-04-24 MED ORDER — OXYCODONE HCL 5 MG PO TABS
5.0000 mg | ORAL_TABLET | Freq: Every day | ORAL | Status: DC
  Administered 2021-04-24: 5 mg via ORAL
  Filled 2021-04-24: qty 1

## 2021-04-24 NOTE — Progress Notes (Unsigned)
Enrolled patient for a 14 day Zio XT  monitor to be mailed to patients home  °

## 2021-04-24 NOTE — ED Notes (Signed)
Breakfast orders placed 

## 2021-04-24 NOTE — Progress Notes (Signed)
Progress Note  Patient Name: Matthew [name] Date of Encounter: 04/24/2021  [facility] Cardiologist: Meriam Sprague, MD   Subjective   Sitting up in bed no further chest pain feeling better not dizzy.  Walking well.  Inpatient Medications    Scheduled Meds:  aspirin EC  81 mg Oral Daily   atorvastatin  80 mg Oral q morning   carvedilol  25 mg Oral BID WC   DULoxetine  60 mg Oral BID   insulin detemir  10 Units Subcutaneous Q breakfast   lisinopril  20 mg Oral Daily   lithium carbonate  1,200 mg Oral QHS   nicotine  21 mg Transdermal Daily   oxyCODONE-acetaminophen  1 tablet Oral 5 X Daily   And   oxyCODONE  5 mg Oral 5 X Daily   pregabalin  50 mg Oral BID   ticagrelor  90 mg Oral BID   traZODone  300 mg Oral QHS   Continuous Infusions:  PRN Meds: acetaminophen **OR** acetaminophen, clonazePAM, naLOXone (NARCAN)  injection   Vital Signs    Vitals:   04/24/21 0630 04/24/21 0645 04/24/21 0700 04/24/21 0715  BP: 117/87 131/89 (!) 118/92 127/89  Pulse: 94 (!) 116 100 94  Resp: 17 16 20 20   Temp:      TempSrc:      SpO2: 91% 93% 92% 96%   No intake or output data in the 24 hours ending 04/24/21 1104 Last 3 Weights 04/21/2021 04/21/2021 03/26/2021  Weight (lbs) 270 lb 297 lb 9.9 oz 272 lb 7.8 oz  Weight (kg) 122.471 kg 135 kg 123.6 kg  Some encounter information is confidential and restricted. Go to Review Flowsheets activity to see all data.      Telemetry    No adverse arrhythmias seen on telemetry- Personally Reviewed  ECG    No ischemic changes sinus rhythm- Personally Reviewed  Physical Exam   GEN: No acute distress.   Neck: No JVD Cardiac: RRR, no murmurs, rubs, or gallops.  Respiratory: Clear to auscultation bilaterally. GI: Soft, nontender, non-distended  MS: No edema; No deformity. Neuro:  Nonfocal  Psych: Normal affect   Labs    High Sensitivity Troponin:   Recent Labs  Lab 04/21/21 2053 04/21/21 2221 04/23/21 1417  04/23/21 1615 04/24/21 0456  TROPONINIHS 5 4 4 4 5      Chemistry Recent Labs  Lab 04/21/21 2053 04/23/21 1417 04/24/21 0456  NA 133* 129* 127*  K 4.0 4.1 4.0  CL 95* 98 95*  CO2 25 20* 21*  GLUCOSE 214* 179* 190*  BUN 8 11 11   CREATININE 1.07 1.02 1.15  CALCIUM 10.8* 9.6 9.5  MG  --   --  2.3  PROT  --   --  7.6  ALBUMIN  --   --  4.4  AST  --   --  36  ALT  --   --  53*  ALKPHOS  --   --  93  BILITOT  --   --  1.1  GFRNONAA >60 >60 >60  ANIONGAP 13 11 11     Lipids No results for input(s): CHOL, TRIG, HDL, LABVLDL, LDLCALC, CHOLHDL in the last 168 hours.  Hematology Recent Labs  Lab 04/21/21 2053 04/23/21 1417 04/24/21 0456  WBC 18.3* 17.3* 17.0*  RBC 5.98* 5.58 5.81  HGB 16.9 16.0 16.7  HCT 51.3 48.1 50.0  MCV 85.8 86.2 86.1  MCH 28.3 28.7 28.7  MCHC 32.9 33.3 33.4  RDW 14.3 14.3 14.4  PLT 358 321 306   Thyroid No results for input(s): TSH, FREET4 in the last 168 hours.  BNPNo results for input(s): BNP, PROBNP in the last 168 hours.  DDimer No results for input(s): DDIMER in the last 168 hours.   Radiology    DG Chest 2 View  Result Date: 04/23/2021 CLINICAL DATA:  Chest pain and syncope for 3 days. EXAM: CHEST - 2 VIEW COMPARISON:  Chest x-ray dated 04/21/2021. FINDINGS: Heart size and mediastinal contours are within normal limits. Lungs are clear. No pleural effusion or pneumothorax is seen. Subacute (healing) to chronic fracture within the posterior RIGHT eighth rib. Remainder of the osseous structures about the chest are unremarkable. IMPRESSION: 1. No active cardiopulmonary disease. No evidence of pneumonia or pulmonary edema. 2. Subacute (healing), less likely chronic, fracture within the posterior RIGHT eighth rib. Electronically Signed   By: Franki Cabot M.D.   On: 04/23/2021 15:46   CT HEAD WO CONTRAST (5MM)  Result Date: 04/23/2021 CLINICAL DATA:  Head trauma, moderate to severe. EXAM: CT HEAD WITHOUT CONTRAST TECHNIQUE: Contiguous axial images  were obtained from the base of the skull through the vertex without intravenous contrast. COMPARISON:  July 26, 2014 FINDINGS: Brain: No evidence of acute infarction, hemorrhage, hydrocephalus, extra-axial collection or mass lesion/mass effect. 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 Vascular: No hyperdense vessel or unexpected calcification. Skull: Normal. Negative for fracture or focal lesion. Sinuses/Orbits: No acute finding. Other: None. IMPRESSION: 1. No acute intracranial abnormality. 2. Stable left posterior fossa arachnoid cyst. Electronically Signed   By: Fidela Salisbury M.D.   On: 04/23/2021 16:37   ECHOCARDIOGRAM COMPLETE  Result Date: 04/24/2021    ECHOCARDIOGRAM REPORT   Patient Name:   Matthew [name] [facility] Date of Exam: 04/24/2021 Medical Rec #:  [id number]       Height:       75.0 in Accession #:    [id number]      Weight:       270.0 lb Date of Birth:  02-15-1976       BSA:          2.493 m Patient Age:    45 years        BP:           118/92 mmHg Patient Gender: M               HR:           106 bpm. Exam Location:  Inpatient Procedure: 2D Echo, Cardiac Doppler and Color Doppler Indications:    R07.9* Chest pain, unspecified  History:        Patient has prior history of Echocardiogram examinations, most                 recent 08/12/2020. CAD; Risk Factors:Diabetes and Hypertension.  Sonographer:    Bernadene Person RDCS Referring Phys: [id number] [place]  1. Left ventricular ejection fraction, by estimation, is 60 to 65%. The left ventricle has normal function. The left ventricle has no regional wall motion abnormalities. There is mild left ventricular hypertrophy. Left ventricular diastolic parameters were normal.  2. Right ventricular systolic function is normal. The right ventricular size is normal. Tricuspid regurgitation signal is inadequate for assessing PA pressure.  3. The mitral valve is normal in structure. No evidence of mitral valve regurgitation.  4. The aortic  valve was not well visualized. Aortic valve regurgitation is not visualized. No aortic stenosis is present. FINDINGS  Left Ventricle: Left  ventricular ejection fraction, by estimation, is 60 to 65%. The left ventricle has normal function. The left ventricle has no regional wall motion abnormalities. The left ventricular internal cavity size was normal in size. There is  mild left ventricular hypertrophy. Left ventricular diastolic parameters were normal. Right Ventricle: The right ventricular size is normal. No increase in right ventricular wall thickness. Right ventricular systolic function is normal. Tricuspid regurgitation signal is inadequate for assessing PA pressure. Left Atrium: Left atrial size was normal in size. Right Atrium: Right atrial size was normal in size. Pericardium: There is no evidence of pericardial effusion. Mitral Valve: The mitral valve is normal in structure. No evidence of mitral valve regurgitation. Tricuspid Valve: The tricuspid valve is normal in structure. Tricuspid valve regurgitation is trivial. Aortic Valve: The aortic valve was not well visualized. Aortic valve regurgitation is not visualized. No aortic stenosis is present. Pulmonic Valve: The pulmonic valve was not well visualized. Pulmonic valve regurgitation is not visualized. Aorta: The aortic root and ascending aorta are structurally normal, with no evidence of dilitation. IAS/Shunts: The interatrial septum was not well visualized.  LEFT VENTRICLE PLAX 2D LVIDd:         5.00 cm      Diastology LVIDs:         2.90 cm      LV e' medial:    7.83 cm/s LV PW:         0.90 cm      LV E/e' medial:  8.1 LV IVS:        1.00 cm      LV e' lateral:   8.59 cm/s LVOT diam:     2.30 cm      LV E/e' lateral: 7.4 LV SV:         54 LV SV Index:   21 LVOT Area:     4.15 cm  LV Volumes (MOD) LV vol d, MOD A2C: 92.2 ml LV vol d, MOD A4C: 127.0 ml LV vol s, MOD A2C: 51.0 ml LV vol s, MOD A4C: 55.7 ml LV SV MOD A2C:     41.2 ml LV SV MOD A4C:      127.0 ml LV SV MOD BP:      54.3 ml RIGHT VENTRICLE RV S prime:     15.20 cm/s TAPSE (M-mode): 2.4 cm LEFT ATRIUM             Index        RIGHT ATRIUM           Index LA diam:        4.00 cm 1.60 cm/m   RA Area:     10.60 cm LA Vol (A2C):   26.9 ml 10.79 ml/m  RA Volume:   19.00 ml  7.62 ml/m LA Vol (A4C):   18.5 ml 7.42 ml/m LA Biplane Vol: 22.5 ml 9.03 ml/m  AORTIC VALVE LVOT Vmax:   85.00 cm/s LVOT Vmean:  55.000 cm/s LVOT VTI:    0.129 m  AORTA Ao Root diam: 3.40 cm Ao Asc diam:  3.10 cm MITRAL VALVE MV Area (PHT): 5.13 cm    SHUNTS MV Decel Time: 148 msec    Systemic VTI:  0.13 m MV E velocity: 63.60 cm/s  Systemic Diam: 2.30 cm MV A velocity: 81.30 cm/s MV E/A ratio:  0.78 Oswaldo Milian MD Electronically signed by Oswaldo Milian MD Signature Date/Time: 04/24/2021/10:39:31 AM    Final     Cardiac Studies   Echocardiogram as  above-reassuring normal pulm function no wall motion abnormalities  Patient Profile     45 y.o. 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  Assessment & Plan    Chest pain - Reassuring troponin reassuring echocardiogram.  Reassuring EKG.  No ischemic changes.  Okay with discharge.  Noncardiac chest pain.   Syncope - Agree that likely neurogenic/orthostatic/vagal.  No pauses on telemetry. -We will go ahead and set up a Zio patch monitor for 14 days.  We will ship this out to his house.  Okay with discharge from a cardiology perspective.  He does have follow-up with Dr. Johney Frame in January already established.   He has not been taking Vascepa, isosorbide, atorvastatin.  On discharge, please make sure he is taking the atorvastatin 80 mg. Continue with aspirin 81 mg Brilinta 90 mg twice a day, carvedilol 25 mg twice a day  For questions or updates, please contact [place] Please  consult [email or web address] for contact info under        Signed, Candee Furbish, MD  04/24/2021, 11:04 AM

## 2021-04-24 NOTE — ED Notes (Signed)
Report given to Aura Fey, RN of [place] [phone]

## 2021-04-24 NOTE — Progress Notes (Signed)
  Echocardiogram 2D Echocardiogram has been performed.  Augustine Radar 04/24/2021, 8:54 AM

## 2021-04-24 NOTE — Discharge Summary (Signed)
Physician Discharge Summary  Matthew [name] [id number] DOB: 08-09-1975 DOA: 04/23/2021  PCP: [name], MD  Admit date: 04/23/2021 Discharge date: 04/24/2021  Admitted From: home Disposition:  hom  Recommendations for Outpatient Follow-up:  Follow up with PCP in 1-2 weeks Please obtain BMP/CBC in one week Please follow up on the following pending results:  Home Health:no  Equipment/Devices: zip patch to be mailed to his house  Discharge Condition: Stable Code Status:   Code Status: Full Code Diet recommendation:  Diet Order             Diet Carb Modified Fluid consistency: Thin; Room service appropriate? Yes  Diet effective now                  Brief/Interim Summary: 45 year old male with history of severe two-vessel CAD s/p PCI LAD plus RCA on 1/22, ICM/HFrEF with recovery, type 2 diabetes, hypertension, hyperlipidemia, OSA, ADHD, celiac disease, schizoaffective disorder Presents to the ED for evaluation of chest pain and syncope.  He has a recent multiple evaluation with chest pain and syncope, most recently he underwent cardiac cath in October which showed patent stents.. He was seen in the ED, vitals are stable, labs with mild hyponatremia, serial troponin negative procalcitonin negative although had leukocytosis 18.3K.  UA no evidence of UTI, influenza and COVID-19 negative, On discharge,  cont atorvastatin 80 mg, aspirin 81 mg Brilinta 90 mg twice a day, carvedilol 25 mg twice a day Chest x-ray subacute healing less likely chronic fracture in the posterior right eighth rib, no evidence of pneumonia or pulmonary edema.  UDS negative.  Cardiologist discussed and does not admit to keep the patient on monitor overnight for observation Overnight telemetry no acute finding, patient remains hemodynamically stable, afebrile WBC count downtrending.Underwent echocardiogram no acute finding, seen by cardiology chest pain atypical on right side likely in the setting of history  fracture and also syncope considered neurogenic/orthostatic/vagal.  Plan is to monitor him on telemetry with Zio patch which will email by cardiology.  Patient has not been taking his Vascepa isosorbide and atorvastatin and has been encouraged to do take them.  He has been cleared for discharge by cardiology.  I have advised him to check CBC BMP in a week from his PCP given his hyponatremia which is somewhat chronic in nature and also with leukocytosis.    Discharge Diagnoses:   Atypical chest pain: Mostly on the right side, has a right rib fractures subacute, seen by cardiology serial troponins negative EKG nonischemic.  Telemetry stable.  Noncardiac chest pain okay for discharge home and he will need to resume his home aspirin Brilinta Coreg and needs to be compliant with this Lipitor and other cardiac meds.  Syncope: Seen by cardiology felt to be neurogenic/orthostatic/vagal, no pause on telemetry, planning to monitor with Zio patch for 14 days which are limited by cardiology.  We discussed about putting compression stocking, and stretching of legs prior to standing and avoiding sudden getting up and be careful with prolonged coughing which seems to help triggered  Severe two-vessel CAD s/p PCI LAD plus RCA on 1/22 ICM/HFrEF with recovery Hypertension Hyperlipidemia: Patient is advised to resume his cardiac medication aspirin Brilinta Coreg Lipitor and other meds.  Blood pressure is controlled this morning  Type 2 diabetes: Resume his home meds.  Last A1c 9.2.  Recent Labs  Lab 04/23/21 2113  GLUCAP 207*     OSA ADHD Celiac disease Stable continue his home meds  Schizoaffective disorder Generalized anxiety  disorder: Mood is stable continue home meds  Chronic hyponatremia Check BMP in a week sodium variable as low as 126 in April 2022  Leukocytosis: No pneumonia on chest x-ray no evidence of UTI procalcitonin negative likely reactive follow-up CBC in 1 week from PCP.  Right rib  fracture: Pain control with Tylenol  Tobacco abuse: Tobacco cessation Consults: Cardiology  Subjective: Alert awake oriented, resting comfortably, denies any complaint. Discharge Exam: Vitals:   04/24/21 0700 04/24/21 0715  BP: (!) 118/92 127/89  Pulse: 100 94  Resp: 20 20  Temp:    SpO2: 92% 96%   General: Pt is alert, awake, not in acute distress Cardiovascular: RRR, S1/S2 +, no rubs, no gallops Respiratory: CTA bilaterally, no wheezing, no rhonchi Abdominal: Soft, NT, ND, bowel sounds + Extremities: no edema, no cyanosis  Discharge Instructions  Discharge Instructions     Discharge instructions   Complete by: As directed    cbc, bmp from pcp in1 wk to monitor your elevated WBC count and low sodium level.  Please use compression stocking on her lower extremities, avoid sudden standing.  Please call call MD or return to ER for similar or worsening recurring problem that brought you to hospital or if any fever,nausea/vomiting,abdominal pain, uncontrolled pain, chest pain,  shortness of breath or any other alarming symptoms.  Please follow-up your doctor as instructed in a week time and call the office for appointment.  Please avoid alcohol, smoking, or any other illicit substance and maintain healthy habits including taking your regular medications as prescribed.  You were cared for by a hospitalist during your hospital stay. If you have any questions about your discharge medications or the care you received while you were in the hospital after you are discharged, you can call the unit and ask to speak with the hospitalist on call if the hospitalist that took care of you is not available.  Once you are discharged, your primary care physician will handle any further medical issues. Please note that NO REFILLS for any discharge medications will be authorized once you are discharged, as it is imperative that you return to your primary care physician (or establish a relationship  with a primary care physician if you do not have one) for your aftercare needs so that they can reassess your need for medications and monitor your lab values   Increase activity slowly   Complete by: As directed       Allergies as of 04/24/2021       Reactions   Gluten Meal Other (See Comments)   Celiac disease   Lactose Intolerance (gi) Diarrhea   Can tolerate hard cheese        Medication List     TAKE these medications    aspirin EC 81 MG tablet Take 1 tablet (81 mg total) by mouth daily. Swallow whole.   atorvastatin 80 MG tablet Commonly known as: LIPITOR Take 1 tablet (80 mg total) by mouth every morning. What changed: Another medication with the same name was removed. Continue taking this medication, and follow the directions you see here.   carvedilol 25 MG tablet Commonly known as: COREG Take 1 tablet (25 mg total) by mouth 2 (two) times daily.   clonazePAM 1 MG tablet Commonly known as: KLONOPIN Take 1 tablet (1 mg total) by mouth 3 (three) times daily as needed for anxiety. What changed: when to take this   DULoxetine 60 MG capsule Commonly known as: CYMBALTA Take 1 capsule (60 mg total)  by mouth 2 (two) times daily.   empagliflozin 25 MG Tabs tablet Commonly known as: Jardiance Take 1 tablet (25 mg total) by mouth daily before breakfast.   gabapentin 800 MG tablet Commonly known as: NEURONTIN 1 po q am, 1/2 po at noon, 1 po at 4 pm, and 1 po at bedtime.   icosapent Ethyl 1 g capsule Commonly known as: VASCEPA Take 2 capsules (2 g total) by mouth 2 (two) times daily.   Insulin Pen Needle 32G X 4 MM Misc 1 Device by Does not apply route daily.   isosorbide mononitrate 30 MG 24 hr tablet Commonly known as: IMDUR Take 1 tablet (30 mg total) by mouth daily.   lisinopril 20 MG tablet Commonly known as: ZESTRIL Take 1 tablet (20 mg total) by mouth daily.   lithium carbonate 300 MG CR tablet Commonly known as: LITHOBID Take 3 tablets (900 mg  total) by mouth at bedtime. What changed:  how much to take when to take this   metFORMIN 1000 MG tablet Commonly known as: GLUCOPHAGE TAKE 1 TABLET (1,000 MG TOTAL) BY MOUTH 2 (TWO) TIMES DAILY WITH A MEAL.   nicotine 21 mg/24hr patch Commonly known as: NICODERM CQ - dosed in mg/24 hours Place 1 patch (21 mg total) onto the skin daily.   nitroGLYCERIN 0.4 MG SL tablet Commonly known as: Nitrostat Place 1 tablet (0.4 mg total) under the tongue every 5 (five) minutes as needed for chest pain.   oxyCODONE-acetaminophen 10-325 MG tablet Commonly known as: PERCOCET Take 1 tablet by mouth 5 (five) times daily.   perphenazine 16 MG tablet Commonly known as: TRILAFON TAKE 1 PILL TOTAL 16 MG EVERY MORNING AND TAKE 2 PILLS BY MOUTH TOTAL 32 MG EVERY BEDTIME What changed: See the new instructions.   pregabalin 75 MG capsule Commonly known as: LYRICA Take 75 mg by mouth 2 (two) times daily. What changed: Another medication with the same name was removed. Continue taking this medication, and follow the directions you see here.   spironolactone 25 MG tablet Commonly known as: ALDACTONE Take 0.5 tablets (12.5 mg total) by mouth daily.   SUDAFED 12 HOUR PO Take 1 tablet by mouth at bedtime.   ticagrelor 90 MG Tabs tablet Commonly known as: BRILINTA Take 1 tablet (90 mg total) by mouth 2 (two) times daily.   traZODone 100 MG tablet Commonly known as: DESYREL TAKE 3 TABLETS BY MOUTH EVERY DAY AT BEDTIME AS NEEDED FOR SLEEP What changed:  how much to take how to take this when to take this additional instructions   Tresiba FlexTouch 100 UNIT/ML FlexTouch Pen Generic drug: insulin degludec Inject 20 Units into the skin daily. What changed: when to take this   Vitamin D (Ergocalciferol) 1.25 MG (50000 UNIT) Caps capsule Commonly known as: DRISDOL Take 50,000 Units by mouth every Sunday.   zaleplon 10 MG capsule Commonly known as: SONATA 1 p.o. nightly as needed sleep.  May  repeat 1 p.o. for mid nocturnal awakening as long as he has 3 hours or more left to sleep.        Follow-up Information     [name], MD Follow up in 1 week(s).   Specialty: Family Medicine Why: cbc, bmp from pcp in1 wk Contact information: [place] [address] [phone]         Freada Bergeron, MD .   Specialties: Cardiology, Radiology Contact information: [id number] [place] [address] [phone]  Allergies  Allergen Reactions   Gluten Meal Other (See Comments)    Celiac disease   Lactose Intolerance (Gi) Diarrhea    Can tolerate hard cheese    The results of significant diagnostics from this hospitalization (including imaging, microbiology, ancillary and laboratory) are listed below for reference.    Microbiology: Recent Results (from the past 240 hour(s))  Resp Panel by RT-PCR (Flu A&B, Covid) Nasopharyngeal Swab     Status: None   Collection Time: 04/21/21  8:22 PM   Specimen: Nasopharyngeal Swab; Nasopharyngeal(NP) swabs in vial transport medium  Result Value Ref Range Status   SARS Coronavirus 2 by RT PCR NEGATIVE NEGATIVE Final    Comment: (NOTE) SARS-CoV-2 target nucleic acids are NOT DETECTED.  The SARS-CoV-2 RNA is generally detectable in upper respiratory specimens during the acute phase of infection. The lowest concentration of SARS-CoV-2 viral copies this assay can detect is 138 copies/mL. A negative result does not preclude SARS-Cov-2 infection and should not be used as the sole basis for treatment or other patient management decisions. A negative result may occur with  improper specimen collection/handling, submission of specimen other than nasopharyngeal swab, presence of viral mutation(s) within the areas targeted by this assay, and inadequate number of viral copies(<138 copies/mL). A negative result must be combined with clinical observations, patient history, and  epidemiological information. The expected result is Negative.  Fact Sheet for Patients:  EntrepreneurPulse.com.au  Fact Sheet for Healthcare Providers:  IncredibleEmployment.be  This test is no t yet approved or cleared by the [place] FDA and  has been authorized for detection and/or diagnosis of SARS-CoV-2 by FDA under an Emergency Use Authorization (EUA). This EUA will remain  in effect (meaning this test can be used) for the duration of the COVID-19 declaration under Section 564(b)(1) of the Act, 21 U.S.C.section 360bbb-3(b)(1), unless the authorization is terminated  or revoked sooner.       Influenza A by PCR NEGATIVE NEGATIVE Final   Influenza B by PCR NEGATIVE NEGATIVE Final    Comment: (NOTE) The Xpert Xpress SARS-CoV-2/FLU/RSV plus assay is intended as an aid in the diagnosis of influenza from Nasopharyngeal swab specimens and should not be used as a sole basis for treatment. Nasal washings and aspirates are unacceptable for Xpert Xpress SARS-CoV-2/FLU/RSV testing.  Fact Sheet for Patients: EntrepreneurPulse.com.au  Fact Sheet for Healthcare Providers: IncredibleEmployment.be  This test is not yet approved or cleared by the [place] FDA and has been authorized for detection and/or diagnosis of SARS-CoV-2 by FDA under an Emergency Use Authorization (EUA). This EUA will remain in effect (meaning this test can be used) for the duration of the COVID-19 declaration under Section 564(b)(1) of the Act, 21 U.S.C. section 360bbb-3(b)(1), unless the authorization is terminated or revoked.  Performed at [facility], [place] [address] [place], [place] [address]   Resp Panel by RT-PCR (Flu A&B, Covid) Nasopharyngeal Swab     Status: None   Collection Time: 04/23/21  5:45 PM   Specimen: Nasopharyngeal Swab; Nasopharyngeal(NP) swabs in vial transport medium  Result Value Ref Range Status    SARS Coronavirus 2 by RT PCR NEGATIVE NEGATIVE Final    Comment: (NOTE) SARS-CoV-2 target nucleic acids are NOT DETECTED.  The SARS-CoV-2 RNA is generally detectable in upper respiratory specimens during the acute phase of infection. The lowest concentration of SARS-CoV-2 viral copies this assay can detect is 138 copies/mL. A negative result does not preclude SARS-Cov-2 infection and should not be used as the sole  basis for treatment or other patient management decisions. A negative result may occur with  improper specimen collection/handling, submission of specimen other than nasopharyngeal swab, presence of viral mutation(s) within the areas targeted by this assay, and inadequate number of viral copies(<138 copies/mL). A negative result must be combined with clinical observations, patient history, and epidemiological information. The expected result is Negative.  Fact Sheet for Patients:  BloggerCourse.com  Fact Sheet for Healthcare Providers:  SeriousBroker.it  This test is no t yet approved or cleared by the [place] FDA and  has been authorized for detection and/or diagnosis of SARS-CoV-2 by FDA under an Emergency Use Authorization (EUA). This EUA will remain  in effect (meaning this test can be used) for the duration of the COVID-19 declaration under Section 564(b)(1) of the Act, 21 U.S.C.section 360bbb-3(b)(1), unless the authorization is terminated  or revoked sooner.       Influenza A by PCR NEGATIVE NEGATIVE Final   Influenza B by PCR NEGATIVE NEGATIVE Final    Comment: (NOTE) The Xpert Xpress SARS-CoV-2/FLU/RSV plus assay is intended as an aid in the diagnosis of influenza from Nasopharyngeal swab specimens and should not be used as a sole basis for treatment. Nasal washings and aspirates are unacceptable for Xpert Xpress SARS-CoV-2/FLU/RSV testing.  Fact Sheet for  Patients: BloggerCourse.com  Fact Sheet for Healthcare Providers: SeriousBroker.it  This test is not yet approved or cleared by the [place] FDA and has been authorized for detection and/or diagnosis of SARS-CoV-2 by FDA under an Emergency Use Authorization (EUA). This EUA will remain in effect (meaning this test can be used) for the duration of the COVID-19 declaration under Section 564(b)(1) of the Act, 21 U.S.C. section 360bbb-3(b)(1), unless the authorization is terminated or revoked.  Performed at [facility], [address] [place], [place] [address]     Procedures/Studies: DG Chest 2 View  Result Date: 04/23/2021 CLINICAL DATA:  Chest pain and syncope for 3 days. EXAM: CHEST - 2 VIEW COMPARISON:  Chest x-ray dated 04/21/2021. FINDINGS: Heart size and mediastinal contours are within normal limits. Lungs are clear. No pleural effusion or pneumothorax is seen. Subacute (healing) to chronic fracture within the posterior RIGHT eighth rib. Remainder of the osseous structures about the chest are unremarkable. IMPRESSION: 1. No active cardiopulmonary disease. No evidence of pneumonia or pulmonary edema. 2. Subacute (healing), less likely chronic, fracture within the posterior RIGHT eighth rib. Electronically Signed   By: Bary Richard M.D.   On: 04/23/2021 15:46   DG Chest 2 View  Result Date: 04/21/2021 CLINICAL DATA:  Cough EXAM: CHEST - 2 VIEW COMPARISON:  03/26/2021 FINDINGS: Lungs are clear.  No pleural effusion or pneumothorax. The heart is normal in size. Visualized osseous structures are within normal limits. IMPRESSION: Normal chest radiographs. Electronically Signed   By: Charline Bills M.D.   On: 04/21/2021 21:08   DG Chest 2 View  Result Date: 03/26/2021 CLINICAL DATA:  Right-sided chest pain that radiates to the back. EXAM: CHEST - 2 VIEW COMPARISON:  Chest radiograph February 24, 2021 FINDINGS: The heart size and  mediastinal contours are within normal limits. No focal consolidation. No pleural effusion. No pneumothorax. The visualized skeletal structures are unremarkable. IMPRESSION: No active cardiopulmonary disease. Electronically Signed   By: Maudry Mayhew M.D.   On: 03/26/2021 19:15   CT HEAD WO CONTRAST ( )  Result Date: 04/23/2021 CLINICAL DATA:  Head trauma, moderate to severe. EXAM: CT HEAD WITHOUT CONTRAST TECHNIQUE: Contiguous axial images were obtained  from the base of the skull through the vertex without intravenous contrast. COMPARISON:  July 26, 2014 FINDINGS: Brain: No evidence of acute infarction, hemorrhage, hydrocephalus, extra-axial collection or mass lesion/mass effect. 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 Vascular: No hyperdense vessel or unexpected calcification. Skull: Normal. Negative for fracture or focal lesion. Sinuses/Orbits: No acute finding. Other: None. IMPRESSION: 1. No acute intracranial abnormality. 2. Stable left posterior fossa arachnoid cyst. Electronically Signed   By: Ted Mcalpineobrinka  Dimitrova M.D.   On: 04/23/2021 16:37   CT Angio Chest PE W and/or Wo Contrast  Result Date: 04/22/2021 CLINICAL DATA:  Cough EXAM: CT ANGIOGRAPHY CHEST WITH CONTRAST TECHNIQUE: Multidetector CT imaging of the chest was performed using the standard protocol during bolus administration of intravenous contrast. Multiplanar CT image reconstructions and MIPs were obtained to evaluate the vascular anatomy. CONTRAST:  75mL OMNIPAQUE IOHEXOL 350 MG/ML SOLN COMPARISON:  Chest x-ray from earlier in the same day. FINDINGS: Cardiovascular: Thoracic aorta shows atherosclerotic calcifications without aneurysmal dilatation or dissection. Pulmonary artery is well visualized within normal branching pattern. No filling defect to suggest pulmonary embolism is noted. Prior coronary stenting is noted. Mediastinum/Nodes: Thoracic inlet is within normal limits. No sizable hilar or mediastinal adenopathy is noted.  The esophagus as visualized is within normal limits. Lungs/Pleura: Lungs are well aerated bilaterally. No focal infiltrate or sizable effusion is seen. Upper Abdomen: Visualized upper abdomen shows fatty infiltration of the liver. Musculoskeletal: Degenerative changes of the thoracic spine are noted. No rib abnormality is noted. Review of the MIP images confirms the above findings. IMPRESSION: No evidence of pulmonary emboli. No acute abnormality noted. Fatty liver. Aortic Atherosclerosis (ICD10-I70.0). Electronically Signed   By: Alcide CleverMark  Lukens M.D.   On: 04/22/2021 00:01   ECHOCARDIOGRAM COMPLETE  Result Date: 04/24/2021    ECHOCARDIOGRAM REPORT   [facility] Exam: 04/24/2021 Medical Rec #:  [id number]       Height:       75.0 in Accession #:    [id number]      Weight:       270.0 lb Date of Birth:  [birth year]       BSA:          2.493 m Patient Age:    45 years        BP:           118/92 mmHg Patient Gender: M               HR:           106 bpm. Exam Location:  Inpatient Procedure: 2D Echo, Cardiac Doppler and Color Doppler Indications:    R07.9* Chest pain, unspecified  History:        Patient has prior history of Echocardiogram examinations, most                 recent 08/12/2020. CAD; Risk Factors:Diabetes and Hypertension.  Sonographer:    Eulah PontSarah Pirrotta RDCS Referring Phys: [id number] Angie FavaJUSTIN B HOWERTER IMPRESSIONS  1. Left ventricular ejection fraction, by estimation, is 60 to 65%. The left ventricle has normal function. The left ventricle has no regional wall motion abnormalities. There is mild left ventricular hypertrophy. Left ventricular diastolic parameters were normal.  2. Right ventricular systolic function is normal. The right ventricular size is normal. Tricuspid regurgitation signal is inadequate for assessing PA pressure.  3. The mitral valve is normal in structure. No evidence of mitral valve regurgitation.  4. The aortic valve  was not well visualized. Aortic valve  regurgitation is not visualized. No aortic stenosis is present. FINDINGS  Left Ventricle: Left ventricular ejection fraction, by estimation, is 60 to 65%. The left ventricle has normal function. The left ventricle has no regional wall motion abnormalities. The left ventricular internal cavity size was normal in size. There is  mild left ventricular hypertrophy. Left ventricular diastolic parameters were normal. Right Ventricle: The right ventricular size is normal. No increase in right ventricular wall thickness. Right ventricular systolic function is normal. Tricuspid regurgitation signal is inadequate for assessing PA pressure. Left Atrium: Left atrial size was normal in size. Right Atrium: Right atrial size was normal in size. Pericardium: There is no evidence of pericardial effusion. Mitral Valve: The mitral valve is normal in structure. No evidence of mitral valve regurgitation. Tricuspid Valve: The tricuspid valve is normal in structure. Tricuspid valve regurgitation is trivial. Aortic Valve: The aortic valve was not well visualized. Aortic valve regurgitation is not visualized. No aortic stenosis is present. Pulmonic Valve: The pulmonic valve was not well visualized. Pulmonic valve regurgitation is not visualized. Aorta: The aortic root and ascending aorta are structurally normal, with no evidence of dilitation. IAS/Shunts: The interatrial septum was not well visualized.  LEFT VENTRICLE PLAX 2D LVIDd:         5.00 cm      Diastology LVIDs:         2.90 cm      LV e' medial:    7.83 cm/s LV PW:         0.90 cm      LV E/e' medial:  8.1 LV IVS:        1.00 cm      LV e' lateral:   8.59 cm/s LVOT diam:     2.30 cm      LV E/e' lateral: 7.4 LV SV:         54 LV SV Index:   21 LVOT Area:     4.15 cm  LV Volumes (MOD) LV vol d, MOD A2C: 92.2 ml LV vol d, MOD A4C: 127.0 ml LV vol s, MOD A2C: 51.0 ml LV vol s, MOD A4C: 55.7 ml LV SV MOD A2C:     41.2 ml LV SV MOD A4C:     127.0 ml LV SV MOD BP:      54.3 ml RIGHT  VENTRICLE RV S prime:     15.20 cm/s TAPSE (M-mode): 2.4 cm LEFT ATRIUM             Index        RIGHT ATRIUM           Index LA diam:        4.00 cm 1.60 cm/m   RA Area:     10.60 cm LA Vol (A2C):   26.9 ml 10.79 ml/m  RA Volume:   19.00 ml  7.62 ml/m LA Vol (A4C):   18.5 ml 7.42 ml/m LA Biplane Vol: 22.5 ml 9.03 ml/m  AORTIC VALVE LVOT Vmax:   85.00 cm/s LVOT Vmean:  55.000 cm/s LVOT VTI:    0.129 m  AORTA Ao Root diam: 3.40 cm Ao Asc diam:  3.10 cm MITRAL VALVE MV Area (PHT): 5.13 cm    SHUNTS MV Decel Time: 148 msec    Systemic VTI:  0.13 m MV E velocity: 63.60 cm/s  Systemic Diam: 2.30 cm MV A velocity: 81.30 cm/s MV E/A ratio:  0.78 Oswaldo Milian MD Electronically signed by  Oswaldo Milian MD Signature Date/Time: 04/24/2021/10:39:31 AM    Final     Labs: BNP (last 3 results) Recent Labs    06/07/20 0500  BNP [id number]*   Basic Metabolic Panel: Recent Labs  Lab 04/21/21 2053 04/23/21 1417 04/24/21 0456  NA 133* 129* 127*  K 4.0 4.1 4.0  CL 95* 98 95*  CO2 25 20* 21*  GLUCOSE 214* 179* 190*  BUN 8 11 11   CREATININE 1.07 1.02 1.15  CALCIUM 10.8* 9.6 9.5  MG  --   --  2.3   Liver Function Tests: Recent Labs  Lab 04/24/21 0456  AST 36  ALT 53*  ALKPHOS 93  BILITOT 1.1  PROT 7.6  ALBUMIN 4.4   Recent Labs  Lab 04/24/21 0456  LIPASE 33   No results for input(s): AMMONIA in the last 168 hours. CBC: Recent Labs  Lab 04/21/21 2053 04/23/21 1417 04/24/21 0456  WBC 18.3* 17.3* 17.0*  NEUTROABS 12.3*  --   --   HGB 16.9 16.0 16.7  HCT 51.3 48.1 50.0  MCV 85.8 86.2 86.1  PLT 358 321 306   Cardiac Enzymes: No results for input(s): CKTOTAL, CKMB, CKMBINDEX, TROPONINI in the last 168 hours. BNP: Invalid input(s): POCBNP CBG: Recent Labs  Lab 04/23/21 2113  GLUCAP 207*   D-Dimer No results for input(s): DDIMER in the last 72 hours. Hgb A1c No results for input(s): HGBA1C in the last 72 hours. Lipid Profile No results for input(s): CHOL, HDL,  LDLCALC, TRIG, CHOLHDL, LDLDIRECT in the last 72 hours. Thyroid function studies No results for input(s): TSH, T4TOTAL, T3FREE, THYROIDAB in the last 72 hours.  Invalid input(s): FREET3 Anemia work up No results for input(s): VITAMINB12, FOLATE, FERRITIN, TIBC, IRON, RETICCTPCT in the last 72 hours. Urinalysis    Component Value Date/Time   COLORURINE STRAW (A) 04/23/2021 2002   APPEARANCEUR CLEAR 04/23/2021 2002   LABSPEC 1.012 04/23/2021 2002   PHURINE 6.0 04/23/2021 2002   GLUCOSEU >=500 (A) 04/23/2021 2002   HGBUR NEGATIVE 04/23/2021 2002   BILIRUBINUR NEGATIVE 04/23/2021 2002   KETONESUR NEGATIVE 04/23/2021 2002   PROTEINUR NEGATIVE 04/23/2021 2002   NITRITE NEGATIVE 04/23/2021 2002   LEUKOCYTESUR NEGATIVE 04/23/2021 2002   Sepsis Labs Invalid input(s): PROCALCITONIN,  WBC,  LACTICIDVEN Microbiology Recent Results (from the past 240 hour(s))  Resp Panel by RT-PCR (Flu A&B, Covid) Nasopharyngeal Swab     Status: None   Collection Time: 04/21/21  8:22 PM   Specimen: Nasopharyngeal Swab; Nasopharyngeal(NP) swabs in vial transport medium  Result Value Ref Range Status   SARS Coronavirus 2 by RT PCR NEGATIVE NEGATIVE Final    Comment: (NOTE) SARS-CoV-2 target nucleic acids are NOT DETECTED.  The SARS-CoV-2 RNA is generally detectable in upper respiratory specimens during the acute phase of infection. The lowest concentration of SARS-CoV-2 viral copies this assay can detect is 138 copies/mL. A negative result does not preclude SARS-Cov-2 infection and should not be used as the sole basis for treatment or other patient management decisions. A negative result may occur with  improper specimen collection/handling, submission of specimen other than nasopharyngeal swab, presence of viral mutation(s) within the areas targeted by this assay, and inadequate number of viral copies(<138 copies/mL). A negative result must be combined with clinical observations, patient history, and  epidemiological information. The expected result is Negative.  Fact Sheet for Patients:  EntrepreneurPulse.com.au  Fact Sheet for Healthcare Providers:  IncredibleEmployment.be  This test is no t yet approved or cleared by the [place]  FDA and  has been authorized for detection and/or diagnosis of SARS-CoV-2 by FDA under an Emergency Use Authorization (EUA). This EUA will remain  in effect (meaning this test can be used) for the duration of the COVID-19 declaration under Section 564(b)(1) of the Act, 21 U.S.C.section 360bbb-3(b)(1), unless the authorization is terminated  or revoked sooner.       Influenza A by PCR NEGATIVE NEGATIVE Final   Influenza B by PCR NEGATIVE NEGATIVE Final    Comment: (NOTE) The Xpert Xpress SARS-CoV-2/FLU/RSV plus assay is intended as an aid in the diagnosis of influenza from Nasopharyngeal swab specimens and should not be used as a sole basis for treatment. Nasal washings and aspirates are unacceptable for Xpert Xpress SARS-CoV-2/FLU/RSV testing.  Fact Sheet for Patients: EntrepreneurPulse.com.au  Fact Sheet for Healthcare Providers: IncredibleEmployment.be  This test is not yet approved or cleared by the [place] FDA and has been authorized for detection and/or diagnosis of SARS-CoV-2 by FDA under an Emergency Use Authorization (EUA). This EUA will remain in effect (meaning this test can be used) for the duration of the COVID-19 declaration under Section 564(b)(1) of the Act, 21 U.S.C. section 360bbb-3(b)(1), unless the authorization is terminated or revoked.  Performed at [facility], [place] [address], [place], [place] [address]   Resp Panel by RT-PCR (Flu A&B, Covid) Nasopharyngeal Swab     Status: None   Collection Time: 04/23/21  5:45 PM   Specimen: Nasopharyngeal Swab; Nasopharyngeal(NP) swabs in vial transport medium  Result Value Ref Range Status    SARS Coronavirus 2 by RT PCR NEGATIVE NEGATIVE Final    Comment: (NOTE) SARS-CoV-2 target nucleic acids are NOT DETECTED.  The SARS-CoV-2 RNA is generally detectable in upper respiratory specimens during the acute phase of infection. The lowest concentration of SARS-CoV-2 viral copies this assay can detect is 138 copies/mL. A negative result does not preclude SARS-Cov-2 infection and should not be used as the sole basis for treatment or other patient management decisions. A negative result may occur with  improper specimen collection/handling, submission of specimen other than nasopharyngeal swab, presence of viral mutation(s) within the areas targeted by this assay, and inadequate number of viral copies(<138 copies/mL). A negative result must be combined with clinical observations, patient history, and epidemiological information. The expected result is Negative.  Fact Sheet for Patients:  EntrepreneurPulse.com.au  Fact Sheet for Healthcare Providers:  IncredibleEmployment.be  This test is no t yet approved or cleared by the [place] FDA and  has been authorized for detection and/or diagnosis of SARS-CoV-2 by FDA under an Emergency Use Authorization (EUA). This EUA will remain  in effect (meaning this test can be used) for the duration of the COVID-19 declaration under Section 564(b)(1) of the Act, 21 U.S.C.section 360bbb-3(b)(1), unless the authorization is terminated  or revoked sooner.       Influenza A by PCR NEGATIVE NEGATIVE Final   Influenza B by PCR NEGATIVE NEGATIVE Final    Comment: (NOTE) The Xpert Xpress SARS-CoV-2/FLU/RSV plus assay is intended as an aid in the diagnosis of influenza from Nasopharyngeal swab specimens and should not be used as a sole basis for treatment. Nasal washings and aspirates are unacceptable for Xpert Xpress SARS-CoV-2/FLU/RSV testing.  Fact Sheet for  Patients: EntrepreneurPulse.com.au  Fact Sheet for Healthcare Providers: IncredibleEmployment.be  This test is not yet approved or cleared by the [place] FDA and has been authorized for detection and/or diagnosis of SARS-CoV-2 by FDA under an Emergency Use Authorization (EUA). This  EUA will remain in effect (meaning this test can be used) for the duration of the COVID-19 declaration under Section 564(b)(1) of the Act, 21 U.S.C. section 360bbb-3(b)(1), unless the authorization is terminated or revoked.  Performed at [facility], [place] [address], [place], [place] [address]      Time coordinating discharge: 25 minutes  SIGNED: Antonieta Pert, MD  [facility] 04/24/2021, 11:29 AM  If 7PM-7AM, please contact night-coverage [email or web address]

## 2021-04-26 ENCOUNTER — Emergency Department (HOSPITAL_COMMUNITY): Payer: Medicaid Other

## 2021-04-26 ENCOUNTER — Encounter (HOSPITAL_COMMUNITY): Payer: Self-pay | Admitting: Emergency Medicine

## 2021-04-26 ENCOUNTER — Emergency Department (HOSPITAL_COMMUNITY)
Admission: EM | Admit: 2021-04-26 | Discharge: 2021-04-26 | Disposition: A | Discharge status: Home / Self Care | Payer: Medicaid Other | Attending: Medical | Admitting: Medical

## 2021-04-26 DIAGNOSIS — R110 Nausea: Secondary | ICD-10-CM | POA: Diagnosis not present

## 2021-04-26 DIAGNOSIS — R079 Chest pain, unspecified: Secondary | ICD-10-CM | POA: Diagnosis present

## 2021-04-26 DIAGNOSIS — Z5321 Procedure and treatment not carried out due to patient leaving prior to being seen by health care provider: Secondary | ICD-10-CM | POA: Diagnosis not present

## 2021-04-26 LAB — COMPREHENSIVE METABOLIC PANEL
ALT: 61 U/L — ABNORMAL HIGH (ref 0–44)
AST: 37 U/L (ref 15–41)
Albumin: 4.4 g/dL (ref 3.5–5.0)
Alkaline Phosphatase: 107 U/L (ref 38–126)
Anion gap: 12 (ref 5–15)
BUN: 11 mg/dL (ref 6–20)
CO2: 18 mmol/L — ABNORMAL LOW (ref 22–32)
Calcium: 9.7 mg/dL (ref 8.9–10.3)
Chloride: 99 mmol/L (ref 98–111)
Creatinine, Ser: 1.03 mg/dL (ref 0.61–1.24)
GFR, Estimated: >60 mL/min (ref >60)
Glucose, Bld: 223 mg/dL — ABNORMAL HIGH (ref 70–99)
Potassium: 3.9 mmol/L (ref 3.5–5.1)
Sodium: 129 mmol/L — ABNORMAL LOW (ref 135–145)
Total Bilirubin: 0.6 mg/dL (ref 0.3–1.2)
Total Protein: 7.2 g/dL (ref 6.5–8.1)

## 2021-04-26 LAB — CBC WITH DIFFERENTIAL/PLATELET
Abs Immature Granulocytes: 0.12 10*3/uL — ABNORMAL HIGH (ref 0.00–0.07)
Basophils Absolute: 0.2 10*3/uL — ABNORMAL HIGH (ref 0.0–0.1)
Basophils Relative: 1 %
Eosinophils Absolute: 0.4 10*3/uL (ref 0.0–0.5)
Eosinophils Relative: 3 %
HCT: 48.8 % (ref 39.0–52.0)
Hemoglobin: 16.8 g/dL (ref 13.0–17.0)
Immature Granulocytes: 1 %
Lymphocytes Relative: 17 %
Lymphs Abs: 2.9 10*3/uL (ref 0.7–4.0)
MCH: 29 pg (ref 26.0–34.0)
MCHC: 34.4 g/dL (ref 30.0–36.0)
MCV: 84.3 fL (ref 80.0–100.0)
Monocytes Absolute: 0.9 10*3/uL (ref 0.1–1.0)
Monocytes Relative: 5 %
Neutro Abs: 12.8 10*3/uL — ABNORMAL HIGH (ref 1.7–7.7)
Neutrophils Relative %: 73 %
Platelets: 326 10*3/uL (ref 150–400)
RBC: 5.79 MIL/uL (ref 4.22–5.81)
RDW: 14.2 % (ref 11.5–15.5)
WBC: 17.2 10*3/uL — ABNORMAL HIGH (ref 4.0–10.5)
nRBC: 0 % (ref 0.0–0.2)

## 2021-04-26 LAB — TROPONIN I (HIGH SENSITIVITY): Troponin I (High Sensitivity): 4 ng/L (ref <18)

## 2021-04-26 LAB — D-DIMER, QUANTITATIVE: D-Dimer, Quant: <0.27 ug/mL-FEU (ref 0.00–0.50)

## 2021-04-26 IMAGING — CR DG CHEST 2V
2 series · 2 of 2 positions shown · non-contrast
Comparison: [DATE]

CLINICAL DATA: Chest pain

EXAM:
CHEST - 2 VIEW

[chest pa]
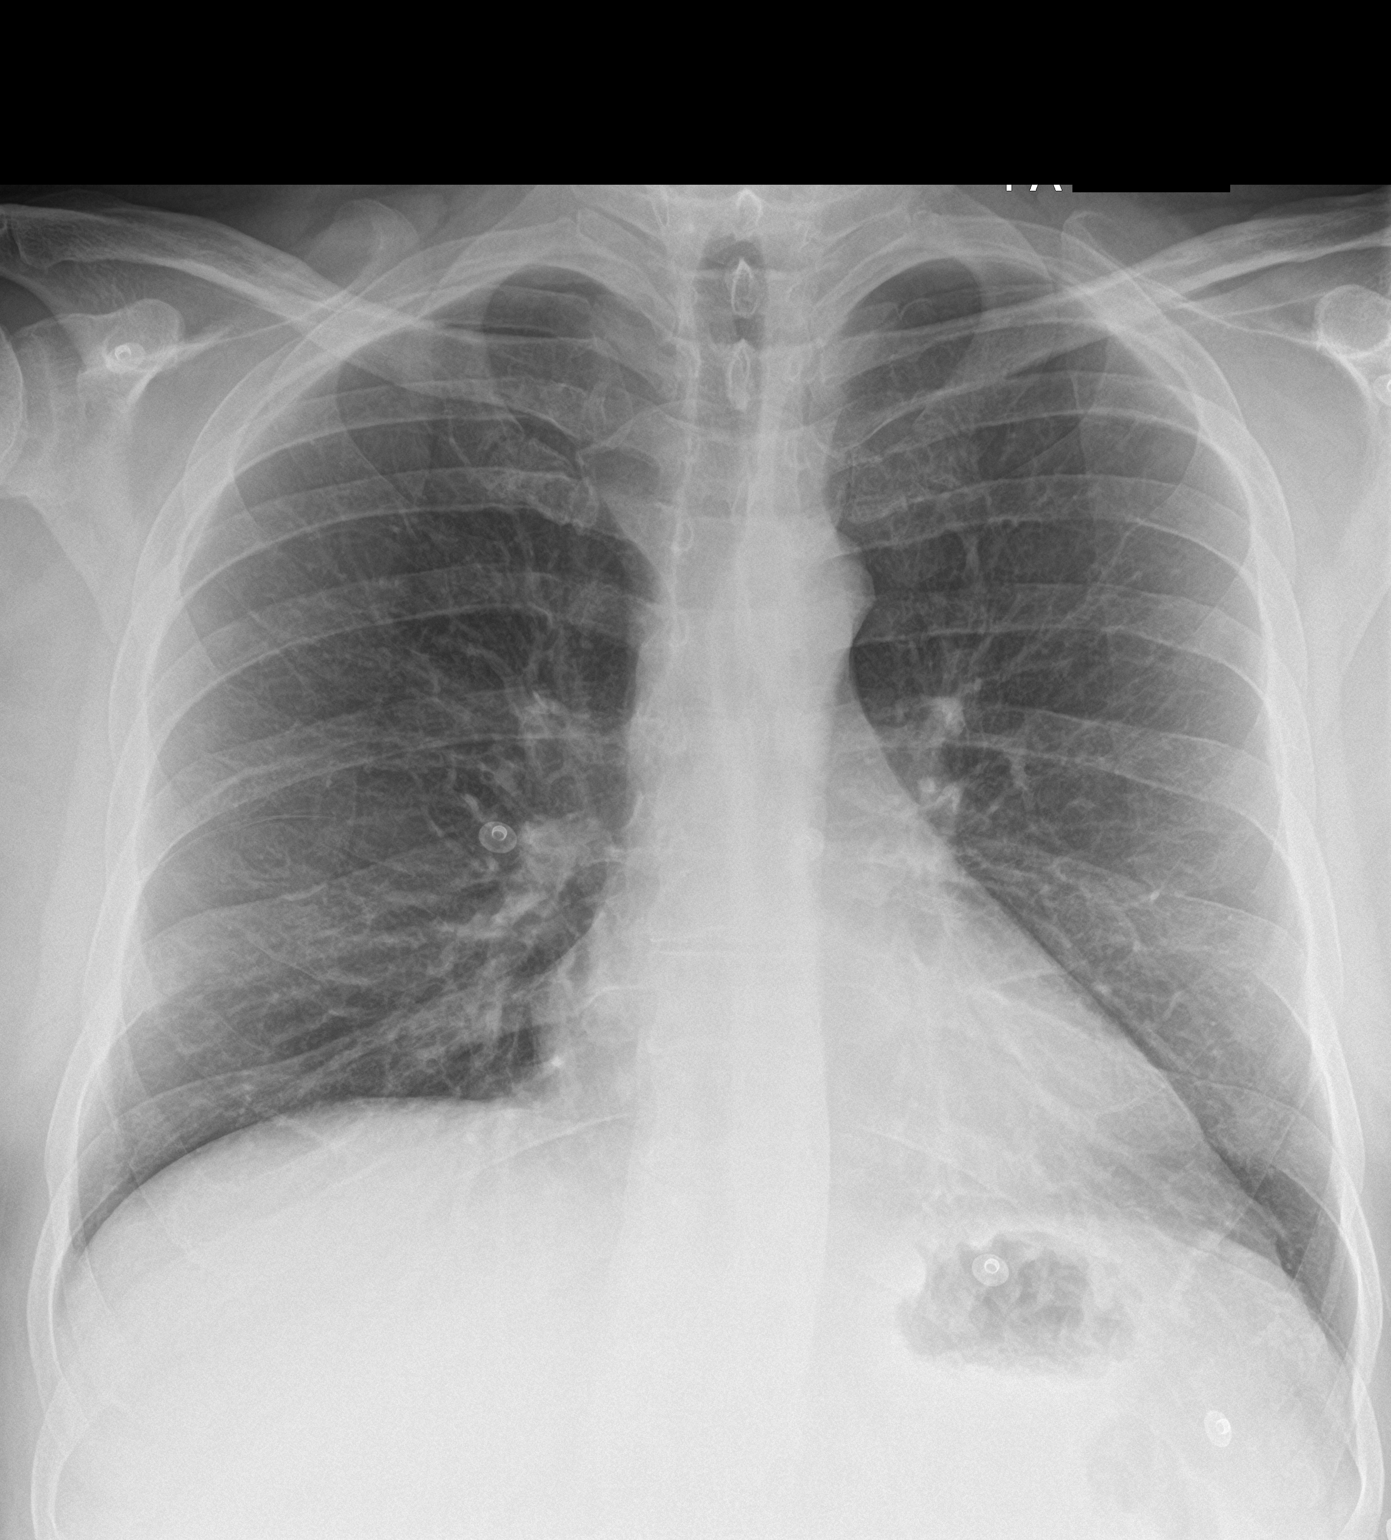

[chest lat]
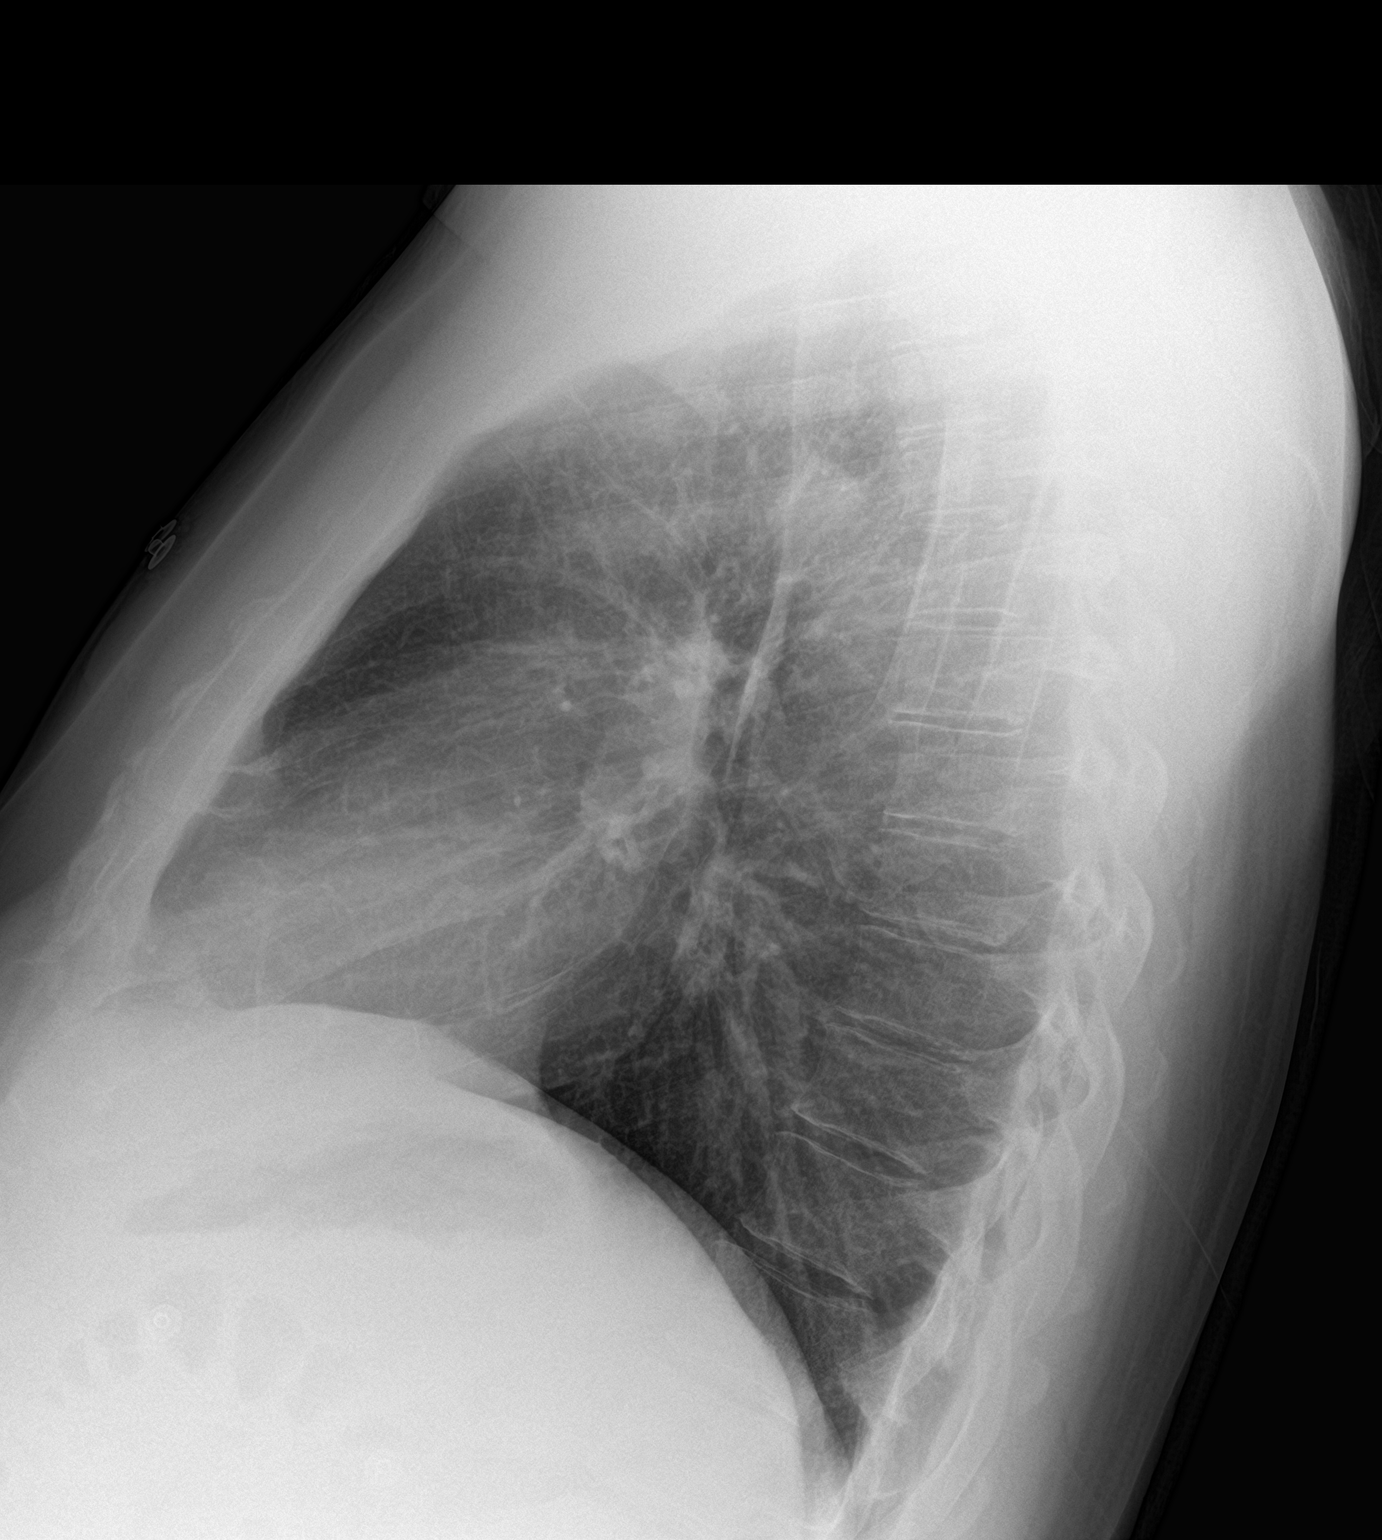

[2 of 2 positions shown; findings below may reference images not displayed]

FINDINGS: The heart size and mediastinal contours are within normal limits.
Both lungs are clear. The visualized skeletal structures are
unremarkable.
IMPRESSION: No active cardiopulmonary disease.

## 2021-04-26 NOTE — ED Triage Notes (Signed)
Patient BIB [facility] from [facility] (halfway house) for evaluation of sharp right sided chest pain, fell on Saturday and broke some ribs on right side. Also staff member at halfway house reported to EMS intermittent nausea and difficulty speaking starting at 1500, no other neurological deficits reported. 20g saline lock in left AC.  EMS vitals HR 100 BP 140/100

## 2021-04-26 NOTE — ED Provider Notes (Signed)
Emergency Medicine Provider Triage Evaluation Note  Matthew [name] , a 45 y.o. male  was evaluated in triage.  Pt complains of right-sided chest pain.  Per EMS they were called out to Matthew [name] for evaluation of sharp right-sided chest pain.  Patient apparently fell a couple of days ago and broke ribs on right side and was evaluated in the ED for same.  Errantly staff member at the halfway [name] reported to EMS the patient has been having intermittent nausea as well as difficulty speaking that started at approximately 3 PM today however EMS did not appreciate any neurological deficits or slurred speech.  Patient arrives to triage he has no focal neurodeficits.  He is alert and oriented x4.  Drug screen negative at this time.  He does mention to me that he has been passing out quite frequently.  It does appear he was evaluated for syncope during ED visit last week.  He states that he can feel lightheaded and feel like he is about to pass out however states that he has been having chest pain as well.  He does not feel it is typical for his rib pain and feels different.  Denies any shortness of breath.  Review of Systems  Positive: + syncope, chest pain Negative: - SOB  Physical Exam  BP (!) 138/93 (BP Location: Right Arm)   Pulse (!) 108   Temp 98.6 F (37 C)   Resp 16   SpO2 95%  Gen:   Awake, no distress   Resp:  Normal effort  MSK:   Moves extremities without difficulty  Other:  CN 2-12 intact. No facial droop. No pronator drift. Normal finger to nose. Strength 5/5 to Bue and BLEs. Sensation intact throughout.   Medical Decision Making  Medically screening exam initiated at 4:36 PM.  Appropriate orders placed.  ERICSON NAFZIGER was informed that the remainder of the evaluation will be completed by another provider, this initial triage assessment does not replace that evaluation, and the importance of remaining in the ED until their evaluation is complete.     Tanda Rockers,  PA-C 04/26/21 1638    Gerhard Munch, MD 04/26/21 [phone]

## 2021-04-26 NOTE — ED Notes (Signed)
Pt asked for IV to be removed and left the building stated he was tired of waiting.

## 2021-05-01 DIAGNOSIS — R55 Syncope and collapse: Secondary | ICD-10-CM

## 2021-05-07 NOTE — Progress Notes (Signed)
Office Visit    Patient Name: Matthew [name] Date of Encounter: 05/08/2021  PCP:  [name], [place]  Cardiologist:  Freada Bergeron, MD  Advanced Practice Provider:  No care team member to display Electrophysiologist:  None   Chief Complaint    Matthew [name] is a 45 y.o. male with a hx of coronary artery disease (NSTEMI in 05/2020 status post DES to LAD and DES to RCA), heart failure with improved ejection fraction, palpitations, hypertension, hyperlipidemia, prediabetes mellitus, celiac disease, schizoaffective disorder, anxiety, ADHD, OSA (not on CPAP and managed by pulmonology), and tobacco abuse presents today for a 44-month follow-up appointment.  Past Medical History    Past Medical History:  Diagnosis Date   Anxiety    Celiac disease    Coronary artery disease    Dairy allergy    Depression    Diabetes mellitus without complication ([place])    High cholesterol    Hypertension    Paranoia ([place])    Pre-diabetes    Schizoaffective disorder ([place])    Past Surgical History:  Procedure Laterality Date   APPENDECTOMY     BRAIN SURGERY     CARDIAC CATHETERIZATION     CORONARY STENT INTERVENTION N/A 06/06/2020   Procedure: CORONARY STENT INTERVENTION;  Surgeon: Martinique, Peter M, MD;  Location: [facility];  Service: Cardiovascular;  Laterality: N/A;  prox LAD, distal RCA   INTRAVASCULAR ULTRASOUND/IVUS N/A 06/06/2020   Procedure: Intravascular Ultrasound/IVUS;  Surgeon: Martinique, Peter M, MD;  Location: [facility];  Service: Cardiovascular;  Laterality: N/A;   LEFT HEART CATH AND CORONARY ANGIOGRAPHY N/A 06/06/2020   Procedure: LEFT HEART CATH AND CORONARY ANGIOGRAPHY;  Surgeon: Martinique, Peter M, MD;  Location: [facility];  Service: Cardiovascular;  Laterality: N/A;   LEFT HEART CATH AND CORONARY ANGIOGRAPHY N/A 02/24/2021   Procedure: LEFT HEART CATH AND CORONARY ANGIOGRAPHY;  Surgeon: Belva Crome, MD;  Location:  [facility];  Service: Cardiovascular;  Laterality: N/A;   NASAL SEPTUM SURGERY     WRIST FRACTURE SURGERY     right wrist    Allergies  Allergies  Allergen Reactions   Gluten Meal Other (See Comments)    Celiac disease   Lactose Intolerance ([place]) Diarrhea    Can tolerate hard cheese    History of Present Illness    Matthew [name] is a 44 y.o. male with a hx of hx of coronary artery disease (NSTEMI in 05/2020 status post DES to LAD and DES to RCA), heart failure with improved ejection fraction, palpitations, hypertension, hyperlipidemia, prediabetes mellitus, celiac disease, schizoaffective disorder, anxiety, ADHD, OSA (not on CPAP and managed by pulmonology), and tobacco abuse presents today for a 29-month follow-up appointment last seen 11/30/2020 by Richardson Dopp, PA  He was seen by Dr. Johney Frame 09/2020.  His carvedilol was increased at that visit.  He had several visits to the ED with chest pain since his MRI.  He was seen in the ED 3 days after his appointment with Dr. Johney Frame in May with chest pain.  His troponins were 4 >> 5.  His heart rate was elevated and he was given IVF's.  He was also given Toradol and Ativan with improvement.  He had another ED visit with chest pain 11/08/2020.  His troponins were 4>> 4 (the notes indicate he had several hours of chest pain prior to presentation).  He had a return visit on  622 and his troponins were 5>> 4>> 4.  He returned to see Richardson Dopp, PA-C in the office on 11/30/2020.  At his last appointment he was drinking an energy drink upon entering the clinic.  He also had not taken his medications that day.  He states he usually takes them with food.  He notes that the chest discomfort he has had which was bringing him to the emergency room was random.  It was nonexertional.  He described it as a sharp pain on the right side.  It is not like his previous angina with his MI.  He did say he had some shortness of breath at times.  He denied  orthopnea, leg edema, and syncope.  He did have a significant cough at this time related to smoking and has had some near syncope related to cough in the past.  He was recently in the ED for right-sided chest pain that was sharp in quality and did not radiate.  He had had this pain for 3 days and it was intermittent.  It was worsened by coughing and nothing made it better.  He did not have any associated symptoms.  He had been seen 2 days prior for the same complaint of chest pain and syncope.  He was afebrile and hemodynamically stable on arrival.  He did have some mild tachycardia, he is not tachypneic.  His saturations are 100% on room air.  The patient states that he had a syncopal episode earlier in the day in the setting of a coughing episode.  He notes becoming lightheaded and his vision narrowing prior to passing out.  He did reportedly strike the back of his head when he passed out.  He is not having any new chest pain radiating to his back.  His was not hypertensive at that time.  He had no pulse discrepancies on exam and there was low suspicion for AAA and aortic dissection.  He continues to smoke up to 3 packs a day.  He endorsed a chronic cough but is afebrile.  His chest x-ray showed no acute infiltrates and there was low suspicion for pneumonia.  Due to his fall and being on aspirin and Brilinta and head CT was ordered.  Today, he here today for a follow-up appointment for his recent hospital visit. He did undergo a recent Echo which showed EF 60-65%.  He did not have any significant valvular disease.  He is still having intermittent chest pain.  Occasionally he states it is exertional.  He states that his chest pain is different than when he had his MI back in January.  He states that it seems to get worse when he is under periods of stress or having some anxiety.  He is currently not working and on disability for his chronic back pain.  His daily routine usually involves helping his parents in  the morning and then going back to his apartment and watching TV.  He seems to live a sedentary lifestyle.  He has been maintaining a low-sodium diet and his blood pressure is well controlled today in the clinic.  He states his blood pressures usually [id number] systolic.  He does have diabetes mellitus and was started on Jardiance and metformin by his endocrinologist.  Usually his sugars are between 170s to 180s at home.  He states he was on Vascepa previously for his triglycerides however his insurance did not cover this medication.  We have recommended 4000 mg of fish oil daily.  He  has had 5 syncopal episodes in the past few months which are always associated with a coughing spell.  He has a chronic cough and continues to smoke 3 packs a day.  He did cut back to 1 pack a day at 1 point in time but has worked his way back up to 3 packs a day.  He attributes this to stress and anxiety.  He is currently wearing a ZIO monitor and is about halfway through the 14 days.  We will plan to do a Myoview Lexi scan at the first of the year once his Zio patch is removed.  He does endorse his heart racing occasionally with standing.  We did orthostatics in the office today and his blood pressure remained the same but his heart rate did increase slightly with standing.  I encouraged hydration and he endorsed always feeling thirsty which could be due to his chronic opioid use.  He has nitroglycerin at home to take if needed.  I have a low suspicion that his pain is cardiac related however we will still plan to do the Myoview Lexi scan to rule out any ischemic heart disease.  He occasionally has some shortness of breath with activity.  He does occasionally have trouble breathing at night but was diagnosed with sleep apnea.  He was fitted at once for CPAP but has had issues with his machine.  We will address a referral to Dr. Claiborne Billings or Dr. Radford Pax at his next follow-up appointment.   No edema, orthopnea, PND.      EKGs/Labs/Other  Studies Reviewed:   The following studies were reviewed today:  ECHO 04/24/2021  IMPRESSIONS Left ventricular ejection fraction, by estimation, is 60 to 65%. The left ventricle has normal function. The left ventricle has no regional wall motion abnormalities. There is mild left ventricular hypertrophy. Left ventricular diastolic parameters were normal. 1. Right ventricular systolic function is normal. The right ventricular size is normal. Tricuspid regurgitation signal is inadequate for assessing PA pressure. 2. 3. The mitral valve is normal in structure. No evidence of mitral valve regurgitation. The aortic valve was not well visualized. Aortic valve regurgitation is not visualized. No aortic stenosis is present.  Echocardiogram 08/12/20 EF 65-70, no RWMA, mild LVH, normal RVSF   Cardiac monitor 06/2020 Patient had a min HR of 55 bpm, max HR of 135 bpm, and avg HR of 88 bpm. Predominant underlying rhythm was Sinus Rhythm. No Isolated SVEs, SVE Couplets, or SVE Triplets were present. Isolated VEs were rare (<1.0%), VE Couplets were rare (<1.0%), and no VE Triplets were present.   Overall, no significant arrhythmias detected on the monitor   LEFT HEART CATH 06/06/2020 Narrative  Dist RCA lesion is 90% stenosed.  A drug-eluting stent was successfully placed using a STENT RESOLUTE ONYX 3.5X15.  Post intervention, there is a 0% residual stenosis.  Prox LAD lesion is 99% stenosed.  A drug-eluting stent was successfully placed using a STENT RESOLUTE ONYX 3.5X18.  Post intervention, there is a 0% residual stenosis.  There is severe left ventricular systolic dysfunction.  LV end diastolic pressure is moderately elevated.  The left ventricular ejection fraction is 25-35% by visual estimate.   1. Severe 2 vessel obstructive CAD - 99% proximal LAD - 90% distal RCA 2. Severe LV dysfunction. EF estimated at 25-30%. Segmental wall motion abnormalities. 3. Moderately elevated  LVEDP 4. Successful PCI of the proximal LAD with IVUS guidance and DES x 1 5. Successful PCI of the distal RCA with IVUS  guidance and DES x 1   Plan: DAPT for one year. Optimal medical therapy for LV dysfunction. Aggressive risk factor modification.       EKG:  EKG is  ordered today.  The ekg ordered today demonstrates sinus tachycardia.   Recent Labs: 06/06/2020: TSH 2.644 06/07/2020: B Natriuretic Peptide 151.1 04/24/2021: Magnesium 2.3 04/26/2021: ALT 61; BUN 11; Creatinine, Ser 1.03; Hemoglobin 16.8; Platelets 326; Potassium 3.9; Sodium 129  Recent Lipid Panel    Component Value Date/Time   CHOL 159 02/25/2021 0139   TRIG 791 (H) 02/25/2021 0139   HDL 22 (L) 02/25/2021 0139   CHOLHDL 7.2 02/25/2021 0139   VLDL UNABLE TO CALCULATE IF TRIGLYCERIDE OVER 400 mg/dL 02/25/2021 0139   LDLCALC UNABLE TO CALCULATE IF TRIGLYCERIDE OVER 400 mg/dL 02/25/2021 0139   LDLDIRECT 42.4 02/25/2021 0139     Home Medications   Current Meds  Medication Sig   aspirin EC 81 MG tablet Take 1 tablet (81 mg total) by mouth daily. Swallow whole.   atorvastatin (LIPITOR) 80 MG tablet Take 1 tablet (80 mg total) by mouth every morning.   carvedilol (COREG) 25 MG tablet Take 1 tablet (25 mg total) by mouth 2 (two) times daily.   clonazePAM (KLONOPIN) 1 MG tablet Take 1 tablet (1 mg total) by mouth 3 (three) times daily as needed for anxiety. (Patient taking differently: Take 1 mg by mouth 3 (three) times daily.)   DULoxetine (CYMBALTA) 60 MG capsule Take 1 capsule (60 mg total) by mouth 2 (two) times daily.   empagliflozin (JARDIANCE) 25 MG TABS tablet Take 1 tablet (25 mg total) by mouth daily before breakfast.   insulin degludec (TRESIBA FLEXTOUCH) 100 UNIT/ML FlexTouch Pen Inject 20 Units into the skin daily. (Patient taking differently: Inject 20 Units into the skin daily after breakfast.)   Insulin Pen Needle 32G X 4 MM MISC 1 Device by Does not apply route daily.   lisinopril (ZESTRIL) 20 MG tablet  Take 1 tablet (20 mg total) by mouth daily.   lithium carbonate (LITHOBID) 300 MG CR tablet Take 3 tablets (900 mg total) by mouth at bedtime. (Patient taking differently: Take 1,200 mg by mouth 2 (two) times daily.)   metFORMIN (GLUCOPHAGE) 1000 MG tablet TAKE 1 TABLET (1,000 MG TOTAL) BY MOUTH 2 (TWO) TIMES DAILY WITH A MEAL.   nitroGLYCERIN (NITROSTAT) 0.4 MG SL tablet Place 1 tablet (0.4 mg total) under the tongue every 5 (five) minutes as needed for chest pain.   oxyCODONE-acetaminophen (PERCOCET) 10-325 MG tablet Take 1 tablet by mouth 5 (five) times daily.   perphenazine (TRILAFON) 16 MG tablet TAKE 1 PILL TOTAL 16 MG EVERY MORNING AND TAKE 2 PILLS BY MOUTH TOTAL 32 MG EVERY BEDTIME (Patient taking differently: Take 16-32 mg by mouth See admin instructions. Take 1 tablet (16 mg) by mouth every morning and 2 tablets (32 mg) at night)   pregabalin (LYRICA) 75 MG capsule Take 75 mg by mouth 2 (two) times daily.   Pseudoephedrine HCl (SUDAFED 12 HOUR PO) Take 1 tablet by mouth at bedtime.   spironolactone (ALDACTONE) 25 MG tablet Take 0.5 tablets (12.5 mg total) by mouth daily.   ticagrelor (BRILINTA) 90 MG TABS tablet Take 1 tablet (90 mg total) by mouth 2 (two) times daily.   traZODone (DESYREL) 100 MG tablet TAKE 3 TABLETS BY MOUTH EVERY DAY AT BEDTIME AS NEEDED FOR SLEEP (Patient taking differently: Take 300 mg by mouth at bedtime.)   Vitamin D, Ergocalciferol, (DRISDOL) 1.25 MG (50000  UNIT) CAPS capsule Take 50,000 Units by mouth every Sunday.     Review of Systems      All other systems reviewed and are otherwise negative except as noted above.  Physical Exam    VS:  BP 122/89    Pulse (!) 103    Ht 6\' 3"  (1.905 m)    Wt 267 lb (121.1 kg)    BMI 33.37 kg/m  , BMI Body mass index is 33.37 kg/m.  Wt Readings from Last 3 Encounters:  05/08/21 267 lb (121.1 kg)  04/21/21 270 lb (122.5 kg)  03/26/21 272 lb 7.8 oz (123.6 kg)     GEN: Well nourished, well developed, in no acute  distress. HEENT: Pinpoint pupils Neck: Supple, no JVD, carotid bruits, or masses. Cardiac: Sinus tachycardia, no murmurs, rubs, or gallops. No clubbing, cyanosis, edema.  Radials/PT 2+ and equal bilaterally.  Respiratory:  Respirations regular and unlabored, clear to auscultation bilaterally. GI: Soft, nontender, nondistended. MS: No deformity or atrophy. Skin: Warm and dry, no rash. Neuro:  Strength and sensation are intact. Psych: Normal affect.  Assessment & Plan    Coronary artery disease involving native coronary artery with angina pectoralis/Precordial pain -NSTEMI 05/2020 treated with DES to the LAD and RCA -Continue on dual antiplatelet therapy for 1 year -Lexiscan Myoview scheduled for January -Continue GDMT: Aspirin, Brilinta, atorvastatin, carvedilol, isosorbide mononitrate, lisinopril, and spironolactone  Sinus tachycardia -Likely related to high caffeine intake -Advised to quit smoking as well as reduce caffeine -He did not seem willing to weaning his cigarette use due to stress/anxiety  Heart failure with improved EF -EF was 25 to 30% at the time of his MI -Improved and looks normal by echocardiogram in Dec 2022 -Continue GDMT: Carvedilol, lisinopril, spironolactone, and Jardiance  Essential hypertension -Blood pressure remains well controlled -Continue low-sodium diet -Avoid energy drinks -Continue current medical regimen  Mixed dyslipidemia -Recent lipid panel 02/2021 showed total cholesterol 159, HDL 22, direct LDL 42.2, and triglycerides 791 -He was on Vascepa but his insurance did not cover. We recommended 4,000mg  fish oil daily - We will recheck Lipid panel and LFTs in 6-8 weeks.  7. Surgical clearance -NSTEMI 05/2020, uninterrupted dual antiplatelet therapy with aspirin and Brilinta for minimum of 12 months post MI -Any procedures that would require holding Brilinta should be postponed till at least January 2023 -He is due for back surgery early next year  and has been on chronic narcotics for back pain  8. Sleep Apnea -Still is having difficulty with breathing at night -He was prescribed a CPAP but had issues with his machine -We will need to address a referral to either Dr. February 2023 or Dr. Tresa Endo at his next appointment  Disposition: Follow up in 2 months with Mayford Knife, MD or APP.  Signed, Meriam Sprague, PA-C 05/08/2021, 12:11 PM [facility]

## 2021-05-08 ENCOUNTER — Other Ambulatory Visit: Payer: Self-pay

## 2021-05-08 ENCOUNTER — Encounter (HOSPITAL_BASED_OUTPATIENT_CLINIC_OR_DEPARTMENT_OTHER): Payer: Self-pay | Admitting: Physician Assistant

## 2021-05-08 ENCOUNTER — Ambulatory Visit (INDEPENDENT_AMBULATORY_CARE_PROVIDER_SITE_OTHER): Payer: Medicaid Other | Admitting: Physician Assistant

## 2021-05-08 ENCOUNTER — Encounter (HOSPITAL_BASED_OUTPATIENT_CLINIC_OR_DEPARTMENT_OTHER): Payer: Self-pay | Admitting: Cardiology

## 2021-05-08 VITALS — BP 122/89 | HR 103 | Ht 75.0 in | Wt 267.0 lb

## 2021-05-08 DIAGNOSIS — E785 Hyperlipidemia, unspecified: Secondary | ICD-10-CM | POA: Diagnosis not present

## 2021-05-08 DIAGNOSIS — R000 Tachycardia, unspecified: Secondary | ICD-10-CM | POA: Diagnosis not present

## 2021-05-08 DIAGNOSIS — I5032 Chronic diastolic (congestive) heart failure: Secondary | ICD-10-CM | POA: Diagnosis not present

## 2021-05-08 DIAGNOSIS — D6869 Other thrombophilia: Secondary | ICD-10-CM

## 2021-05-08 DIAGNOSIS — I2510 Atherosclerotic heart disease of native coronary artery without angina pectoris: Secondary | ICD-10-CM | POA: Diagnosis not present

## 2021-05-08 DIAGNOSIS — G4733 Obstructive sleep apnea (adult) (pediatric): Secondary | ICD-10-CM

## 2021-05-08 DIAGNOSIS — I4891 Unspecified atrial fibrillation: Secondary | ICD-10-CM

## 2021-05-08 DIAGNOSIS — I10 Essential (primary) hypertension: Secondary | ICD-10-CM

## 2021-05-08 NOTE — Patient Instructions (Signed)
Medication Instructions:  Your physician has recommended you make the following change in your medication:   Start: 4,000mg  over the counter Fish Oil *If you need a refill on your cardiac medications before your next appointment, please call your pharmacy*   Lab Work: Your physician recommends that you return for lab work in 6-8 weeks for Fasting Lipids and Liver Function Tests  If you have labs (blood work) drawn today and your tests are completely normal, you will receive your results only by: MyChart Message (if you have MyChart) OR A paper copy in the mail If you have any lab test that is abnormal or we need to change your treatment, we will call you to review the results.   Testing/Procedures: Your physician has requested that you have a lexiscan myoview. For further information please visit [email or web address] Please follow instruction sheet, as given.    Follow-Up: At [facility], you and your health needs are our priority.  As part of our continuing mission to provide you with exceptional heart care, we have created designated Provider Care Teams.  These Care Teams include your primary Cardiologist (physician) and Advanced Practice Providers (APPs -  Physician Assistants and Nurse Practitioners) who all work together to provide you with the care you need, when you need it.  We recommend signing up for the patient portal called "MyChart".  Sign up information is provided on this After Visit Summary.  MyChart is used to connect with patients for Virtual Visits (Telemedicine).  Patients are able to view lab/test results, encounter notes, upcoming appointments, etc.  Non-urgent messages can be sent to your provider as well.   To learn more about what you can do with MyChart, go to [email or web address].    Your next appointment:   3 month(s)  The format for your next appointment:   In Person  Provider:   Meriam Sprague, MD     Other Instructions None today

## 2021-05-08 NOTE — Addendum Note (Signed)
Addended by: Marlene Lard on: 05/08/2021 01:57 PM   Modules accepted: Orders

## 2021-05-10 ENCOUNTER — Telehealth (HOSPITAL_COMMUNITY): Payer: Self-pay | Admitting: *Deleted

## 2021-05-10 NOTE — Telephone Encounter (Signed)
Patient given detailed instructions per Myocardial Perfusion Study Information Sheet for the test on 05/17/21 at 0815. Patient notified to arrive 15 minutes early and that it is imperative to arrive on time for appointment to keep from having the test rescheduled.  If you need to cancel or reschedule your appointment, please call the office within 24 hours of your appointment. . Patient verbalized understanding.Matthew [name], Matthew [name] Patient has instructions

## 2021-05-12 ENCOUNTER — Other Ambulatory Visit: Payer: Self-pay | Admitting: Physician Assistant

## 2021-05-17 ENCOUNTER — Other Ambulatory Visit: Payer: Self-pay | Admitting: Physician Assistant

## 2021-05-17 ENCOUNTER — Ambulatory Visit (HOSPITAL_COMMUNITY): Payer: Medicaid Other | Attending: Cardiovascular Disease

## 2021-05-17 DIAGNOSIS — F250 Schizoaffective disorder, bipolar type: Secondary | ICD-10-CM

## 2021-05-17 MED ORDER — TICAGRELOR 90 MG PO TABS
90.0000 mg | ORAL_TABLET | Freq: Two times a day (BID) | ORAL | 11 refills | Status: DC
Start: 2021-05-17 — End: 2021-11-10

## 2021-05-17 NOTE — Telephone Encounter (Signed)
Received refill request for lithium for 2 tablets QHS. Confirmed with patient that he is taking 4 tablets daily - 2 twice a day since his last Ll on 11/29. He has not had a Ll since then.

## 2021-05-17 NOTE — Telephone Encounter (Signed)
I'm confused on your last note, reports taking 4 at hs? Refill is for 2 at hs. He's not had labs that I see. Also mentions changing to 2 bid.

## 2021-05-17 NOTE — Telephone Encounter (Signed)
Lithium level on 04/18/2021 was 0.4.

## 2021-05-17 NOTE — Telephone Encounter (Signed)
90 day ok?

## 2021-05-19 ENCOUNTER — Ambulatory Visit: Payer: Medicaid Other | Admitting: Internal Medicine

## 2021-05-22 ENCOUNTER — Telehealth: Payer: Self-pay | Admitting: Nurse Practitioner

## 2021-05-22 MED ORDER — LISINOPRIL 20 MG PO TABS: 10.0000 mg | ORAL_TABLET | Freq: Every day | ORAL | 3 refills | Status: DC

## 2021-05-22 NOTE — Telephone Encounter (Signed)
° °  Pt called to report that he has continued to have issues w/ presyncope and syncope since his ED visits in December and office visit 12/19 (orthostatic by HR that day).  He notes that he becomes lightheaded upon standing and at least 2-3 x/wk, he loses consciousness and falls to the floor.  No sustained injuries.  He's concerned that he might be over medicated.  He's never checked his BP after one of these spells.  He just recently completed a Zio, and notes that he had similar episodes while wearing.  I reviewed available monitor report - no significant arrhythmias:  Patch Wear Time:  11 days and 17 hours (2022-12-12T19:25:03-499 to 2022-12-24T12:40:12-498)   Patient had a min HR of 56 bpm, max HR of 140 bpm, and avg HR of 101 bpm. Predominant underlying rhythm was Sinus Rhythm. Isolated SVEs were rare (<1.0%), and no SVE Couplets or SVE Triplets were present. Isolated VEs were rare (<1.0%, 9085), VE Couplets  were rare (<1.0%, 31), and VE Triplets were rare (<1.0%, 1). Ventricular Bigeminy and Trigeminy were present.   Lab Results  Component Value Date   CREATININE 1.03 04/26/2021   BUN 11 04/26/2021   NA 129 (L) 04/26/2021   K 3.9 04/26/2021   CL 99 04/26/2021   CO2 18 (L) 04/26/2021    Lab Results  Component Value Date   WBC 17.2 (H) 04/26/2021   HGB 16.8 04/26/2021   HCT 48.8 04/26/2021   MCV 84.3 04/26/2021   PLT 326 04/26/2021     Reassurance offered re: monitoring.  I reviewed his home meds and advised that he reduce his lisinopril to 1/2 tab daily (has 20mg  tab).  Otw he will cont carvedilol 25 bid and spiro 12.5 daily.  He has f/u w/ Dr. Johney Frame on 2/14.  I will reach out to office to see if a sooner appt might be available.  I advised that pt should not be driving and he should have a low threshold to call EMS for recurrent syncope.  Caller verbalized understanding and was grateful for the call back.  Murray Hodgkins, NP 05/22/2021, 11:07 AM

## 2021-05-23 ENCOUNTER — Emergency Department (HOSPITAL_COMMUNITY)
Admission: EM | Admit: 2021-05-23 | Discharge: 2021-05-23 | Disposition: A | Discharge status: Home / Self Care | Payer: Medicaid Other | Attending: Emergency Medicine | Admitting: Emergency Medicine

## 2021-05-23 ENCOUNTER — Encounter (HOSPITAL_COMMUNITY): Payer: Self-pay

## 2021-05-23 ENCOUNTER — Telehealth (HOSPITAL_BASED_OUTPATIENT_CLINIC_OR_DEPARTMENT_OTHER): Payer: Self-pay | Admitting: Nurse Practitioner

## 2021-05-23 ENCOUNTER — Encounter (HOSPITAL_COMMUNITY): Payer: Self-pay | Admitting: Physician Assistant

## 2021-05-23 ENCOUNTER — Emergency Department (HOSPITAL_COMMUNITY): Payer: Medicaid Other

## 2021-05-23 ENCOUNTER — Other Ambulatory Visit: Payer: Self-pay

## 2021-05-23 DIAGNOSIS — Z5321 Procedure and treatment not carried out due to patient leaving prior to being seen by health care provider: Secondary | ICD-10-CM | POA: Diagnosis not present

## 2021-05-23 DIAGNOSIS — R55 Syncope and collapse: Secondary | ICD-10-CM | POA: Diagnosis not present

## 2021-05-23 DIAGNOSIS — R079 Chest pain, unspecified: Secondary | ICD-10-CM | POA: Insufficient documentation

## 2021-05-23 LAB — TROPONIN I (HIGH SENSITIVITY)
Troponin I (High Sensitivity): 5 ng/L (ref <18)
Troponin I (High Sensitivity): 6 ng/L (ref <18)

## 2021-05-23 LAB — CBC
HCT: 50.2 % (ref 39.0–52.0)
Hemoglobin: 17.6 g/dL — ABNORMAL HIGH (ref 13.0–17.0)
MCH: 29.4 pg (ref 26.0–34.0)
MCHC: 35.1 g/dL (ref 30.0–36.0)
MCV: 83.9 fL (ref 80.0–100.0)
Platelets: 283 10*3/uL (ref 150–400)
RBC: 5.98 MIL/uL — ABNORMAL HIGH (ref 4.22–5.81)
RDW: 14.2 % (ref 11.5–15.5)
WBC: 9 10*3/uL (ref 4.0–10.5)
nRBC: 0 % (ref 0.0–0.2)

## 2021-05-23 LAB — BASIC METABOLIC PANEL
Anion gap: 13 (ref 5–15)
BUN: 11 mg/dL (ref 6–20)
CO2: 18 mmol/L — ABNORMAL LOW (ref 22–32)
Calcium: 9.4 mg/dL (ref 8.9–10.3)
Chloride: 94 mmol/L — ABNORMAL LOW (ref 98–111)
Creatinine, Ser: 0.94 mg/dL (ref 0.61–1.24)
GFR, Estimated: >60 mL/min (ref >60)
Glucose, Bld: 260 mg/dL — ABNORMAL HIGH (ref 70–99)
Potassium: 4.4 mmol/L (ref 3.5–5.1)
Sodium: 125 mmol/L — ABNORMAL LOW (ref 135–145)

## 2021-05-23 IMAGING — CR DG CHEST 2V
2 series · 2 of 2 positions shown · non-contrast
Comparison: Chest x-ray [DATE], CT chest [DATE]

CLINICAL DATA: CP

EXAM:
CHEST - 2 VIEW

[chest pa]
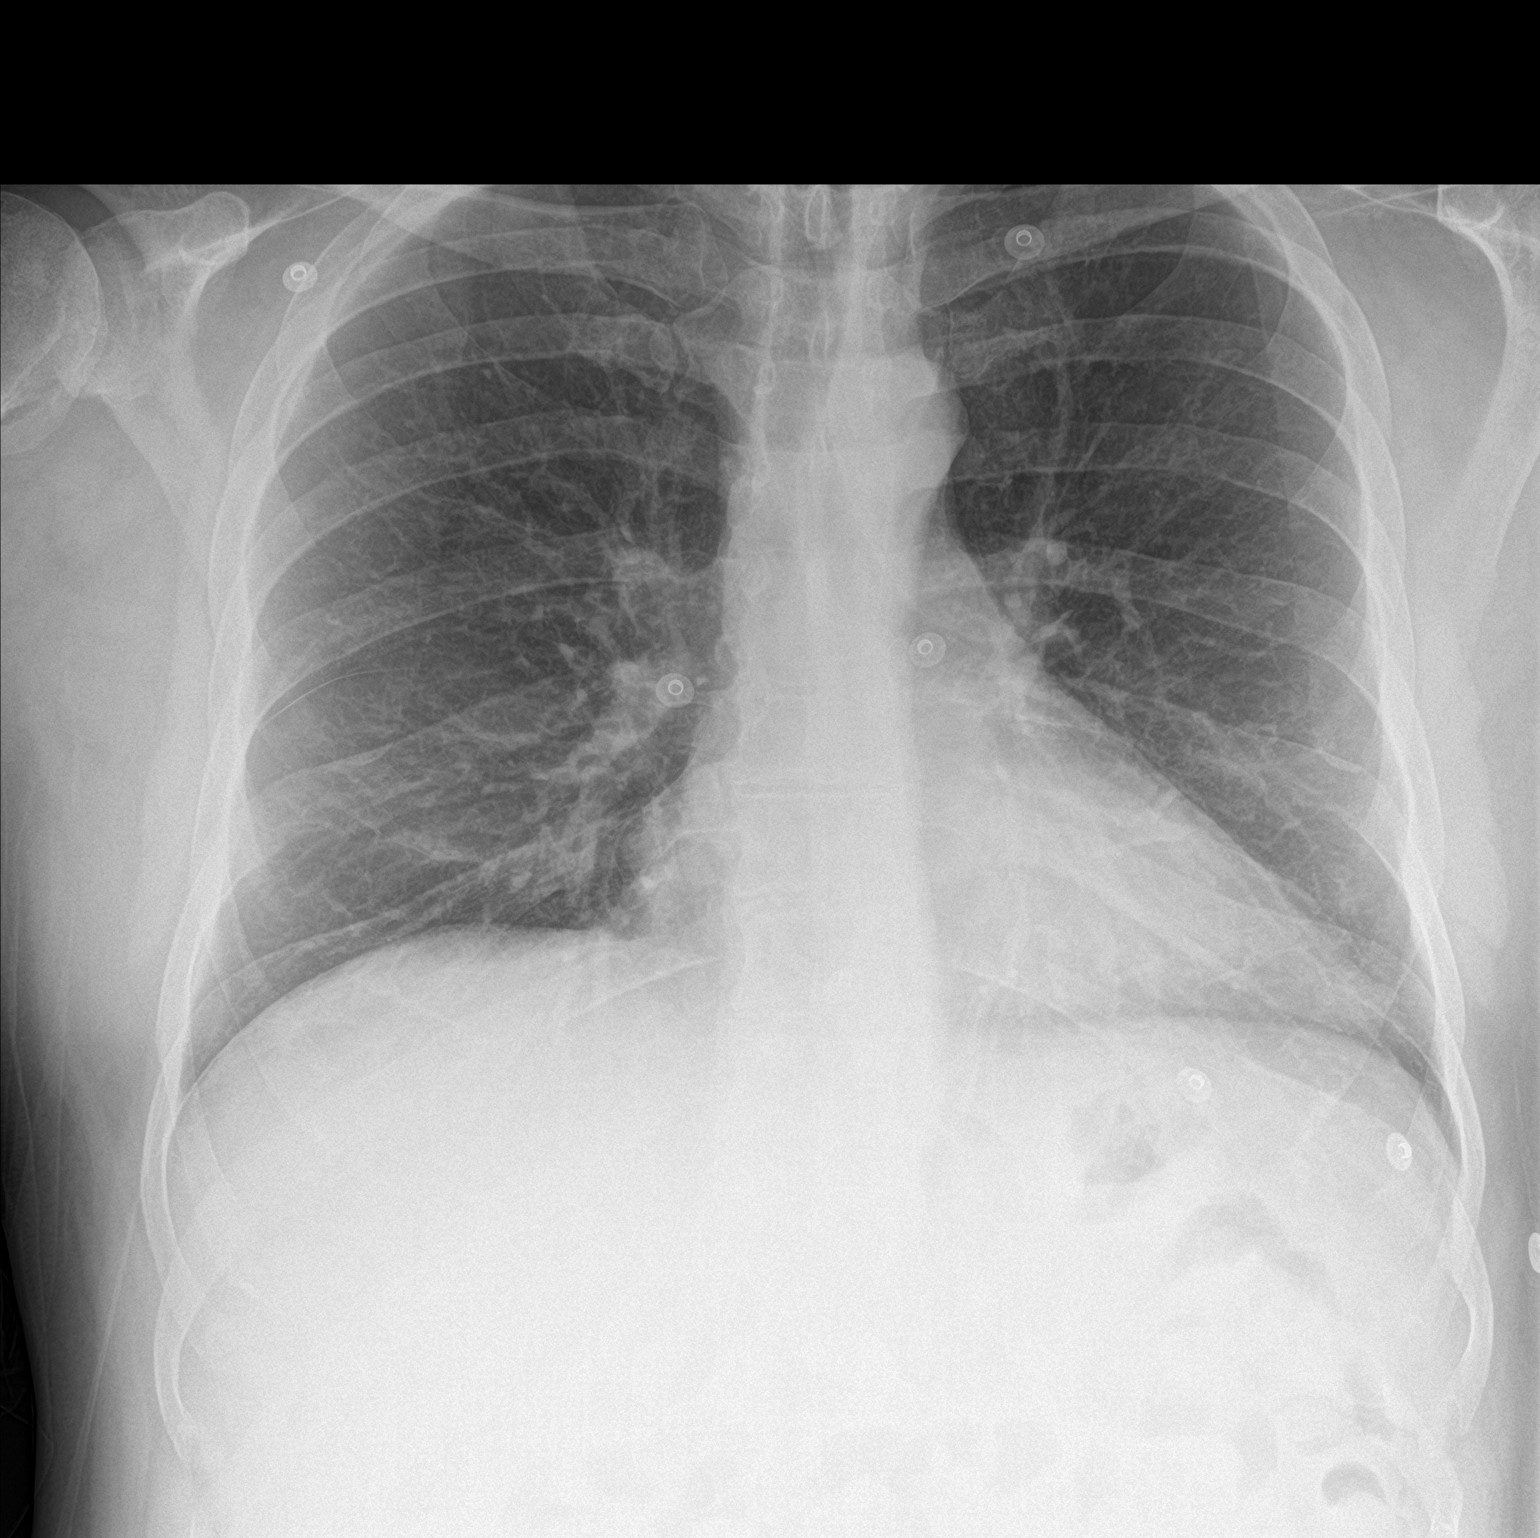

[chest lat]
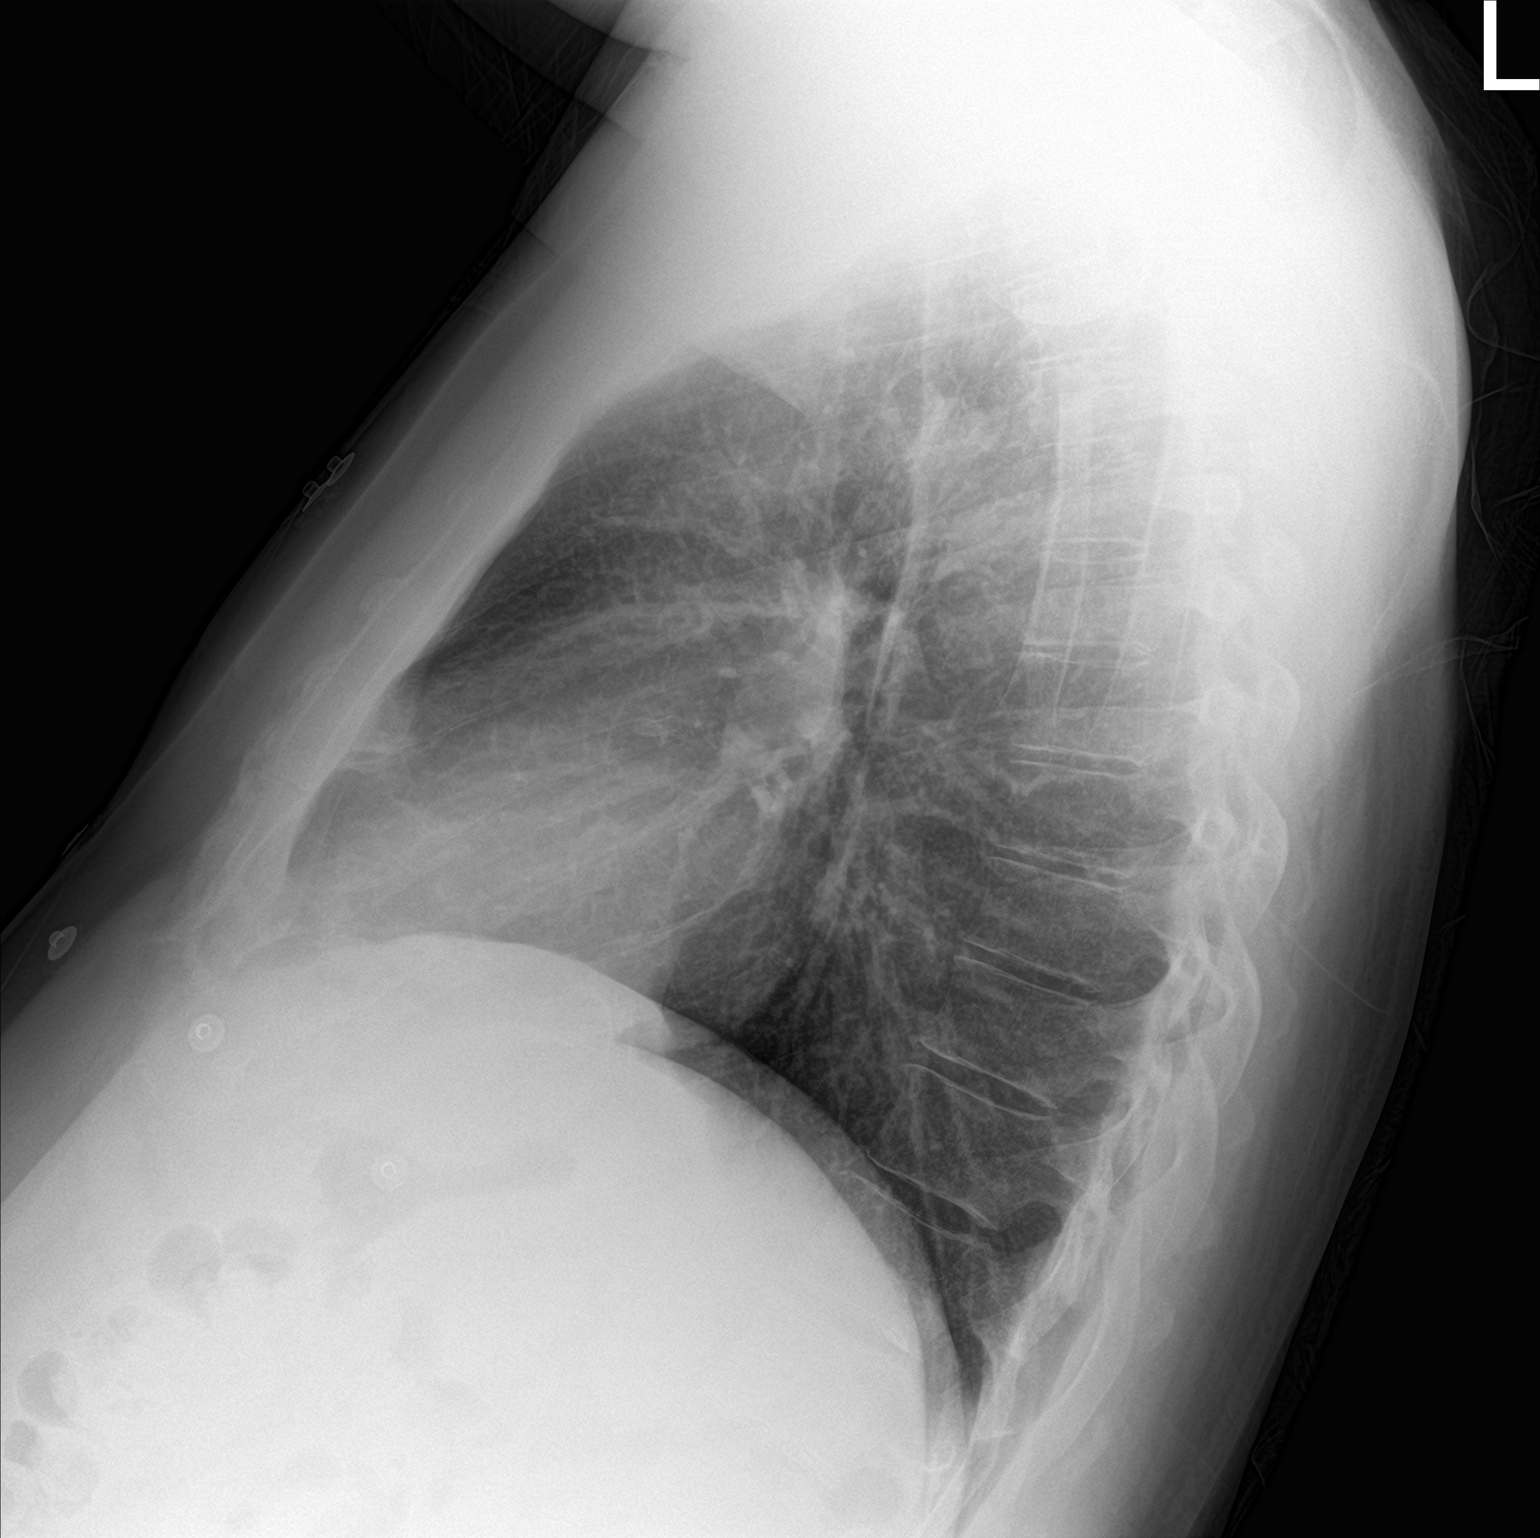

[2 of 2 positions shown; findings below may reference images not displayed]

FINDINGS: The heart and mediastinal contours are unchanged.

No focal consolidation. No pulmonary edema. No pleural effusion. No
pneumothorax.

No acute osseous abnormality.
IMPRESSION: No active cardiopulmonary disease.

## 2021-05-23 NOTE — ED Provider Notes (Signed)
Emergency Medicine Provider Triage Evaluation Note  Matthew [name] , a 46 y.o. male  was evaluated in triage.  Pt complains of intermittent chest pain and syncope.  He has had syncope 11 times in the past 7 days.  Sent in by cardiology, no palpitations..  Review of Systems  Positive: Syncope Negative: Fever  Physical Exam  BP (!) 146/97    Pulse 94    Temp 98.4 F (36.9 C) (Oral)    Resp 18    SpO2 98%  Gen:   Awake, no distress   Resp:  Normal effort  MSK:   Moves extremities without difficulty  Other:  Normal rate and rhythm  Medical Decision Making  Medically screening exam initiated at 4:53 PM.  Appropriate orders placed.  Matthew [name] was informed that the remainder of the evaluation will be completed by another provider, this initial triage assessment does not replace that evaluation, and the importance of remaining in the ED until their evaluation is complete.  Work-up initiated   Arthor Captain, PA-C 05/23/21 1655    [place], [place], MD 05/24/21 [phone]

## 2021-05-23 NOTE — ED Notes (Signed)
Called pt for repeat labs, no answer

## 2021-05-23 NOTE — ED Triage Notes (Signed)
Patient reports that he has had chest pain with daily syncopal events for the past 2 months. Medications have been changed with no improvement. Patient alert and oriented and complains of SSCP on arrival

## 2021-05-23 NOTE — ED Notes (Signed)
Patient states he wants his IV taken out so he can leave.

## 2021-05-23 NOTE — Telephone Encounter (Signed)
Left message for patient to call and discuss "moving up" his 07/04/20 appointment with Dr. Johney Frame or an APP. (Per 05/22/21 staff message from Ignacia Bayley, NP)

## 2021-05-24 NOTE — Telephone Encounter (Signed)
Left message for patient to call and discuss moving up 07/05/21 appt (per 05/22/21 message from Ignacia Bayley, NP)

## 2021-05-25 ENCOUNTER — Ambulatory Visit: Payer: Medicaid Other | Admitting: Internal Medicine

## 2021-05-25 ENCOUNTER — Other Ambulatory Visit: Payer: Self-pay | Admitting: Physician Assistant

## 2021-05-25 NOTE — Progress Notes (Deleted)
Name: Matthew [name]  Age/ Sex: 46 y.o., male   MRN/ DOB: [id number], 04-21-1976     PCP: [name], MD   Reason for Endocrinology Evaluation: Type 2 Diabetes Mellitus  Initial Endocrine Consultative Visit: 08/15/2020    PATIENT IDENTIFIER: Matthew [name] is a 46 y.o. male with a past medical history of T2DN, CAD , OSA and schizoaffective d/o. The patient has followed with Endocrinology clinic since 08/15/2020 for consultative assistance with management of his diabetes.  DIABETIC HISTORY:  Matthew [name] was diagnosed with DM 2021,he has been on metformin and jardiance . His hemoglobin A1c has ranged from 6.4% in 2020, peaking at 12.8% in 2022.  Lives alone    Pt on disability for mental health  SUBJECTIVE:   During the last visit (11/16/2020): A1c 9.2 % , we continued metformin, Jardiance and  started insulin   Today (05/25/2021): Matthew [name] is here for a diabetes management.  He checks his blood sugars 0 times daily.   ED visit on 05/23/2021 for chest pain and syncope  HOME DIABETES REGIMEN:  Metformin 1000 mg , 1 tablet before Breakfast and 1 tablet before Supper Jardiance 25 mg, 1 tablet before Breakfast Tresiba 20 units daily   Statin: yes ACE-I/ARB: yes   METER DOWNLOAD SUMMARY: Does not check    DIABETIC COMPLICATIONS: Microvascular complications:   Denies: CKD, retinopathy, neuropathy  Last Eye Exam: Completed 01/25/2021  Macrovascular complications:  CAD Denies: CVA, PVD   HISTORY:  Past Medical History:  Past Medical History:  Diagnosis Date   Anxiety    Celiac disease    Coronary artery disease    Dairy allergy    Depression    Diabetes mellitus without complication (HCC)    High cholesterol    Hypertension    Paranoia (HCC)    Pre-diabetes    Schizoaffective disorder (HCC)    Past Surgical History:  Past Surgical History:  Procedure Laterality Date   APPENDECTOMY     BRAIN SURGERY     CARDIAC CATHETERIZATION     CORONARY STENT INTERVENTION  N/A 06/06/2020   Procedure: CORONARY STENT INTERVENTION;  Surgeon: Swaziland, Peter M, MD;  Location: [facility] INVASIVE CV LAB;  Service: Cardiovascular;  Laterality: N/A;  prox LAD, distal RCA   INTRAVASCULAR ULTRASOUND/IVUS N/A 06/06/2020   Procedure: Intravascular Ultrasound/IVUS;  Surgeon: Swaziland, Peter M, MD;  Location: [facility] INVASIVE CV LAB;  Service: Cardiovascular;  Laterality: N/A;   LEFT HEART CATH AND CORONARY ANGIOGRAPHY N/A 06/06/2020   Procedure: LEFT HEART CATH AND CORONARY ANGIOGRAPHY;  Surgeon: Swaziland, Peter M, MD;  Location: [facility] INVASIVE CV LAB;  Service: Cardiovascular;  Laterality: N/A;   LEFT HEART CATH AND CORONARY ANGIOGRAPHY N/A 02/24/2021   Procedure: LEFT HEART CATH AND CORONARY ANGIOGRAPHY;  Surgeon: Lyn Records, MD;  Location: [facility] INVASIVE CV LAB;  Service: Cardiovascular;  Laterality: N/A;   NASAL SEPTUM SURGERY     WRIST FRACTURE SURGERY     right wrist   Social History:  reports that he has been smoking cigarettes and e-cigarettes. He has been smoking an average of .5 packs per day. He has never used smokeless tobacco. He reports that he does not drink alcohol and does not use drugs. Family History:  Family History  Problem Relation Age of Onset   Asthma Mother    Hypertension Mother    Hypertension Father      HOME MEDICATIONS: Allergies as of 05/25/2021       Reactions   Gluten Meal  Other (See Comments)   Celiac disease   Lactose Intolerance (gi) Diarrhea   Can tolerate hard cheese        Medication List        Accurate as of May 25, 2021  7:40 AM. If you have any questions, ask your nurse or doctor.          aspirin EC 81 MG tablet Take 1 tablet (81 mg total) by mouth daily. Swallow whole.   atorvastatin 80 MG tablet Commonly known as: LIPITOR Take 1 tablet (80 mg total) by mouth every morning.   carvedilol 25 MG tablet Commonly known as: COREG Take 1 tablet (25 mg total) by mouth 2 (two) times daily.   clonazePAM 1 MG tablet Commonly known  as: KLONOPIN Take 1 tablet (1 mg total) by mouth 3 (three) times daily as needed for anxiety. What changed: when to take this   DULoxetine 60 MG capsule Commonly known as: CYMBALTA TAKE 1 CAPSULE BY MOUTH 2 TIMES DAILY.   empagliflozin 25 MG Tabs tablet Commonly known as: Jardiance Take 1 tablet (25 mg total) by mouth daily before breakfast.   Insulin Pen Needle 32G X 4 MM Misc 1 Device by Does not apply route daily.   lisinopril 20 MG tablet Commonly known as: ZESTRIL Take 0.5 tablets (10 mg total) by mouth daily.   lithium carbonate 300 MG CR tablet Commonly known as: LITHOBID Take 2 tablets (600 mg total) by mouth 2 (two) times daily.   metFORMIN 1000 MG tablet Commonly known as: GLUCOPHAGE TAKE 1 TABLET (1,000 MG TOTAL) BY MOUTH 2 (TWO) TIMES DAILY WITH A MEAL.   nitroGLYCERIN 0.4 MG SL tablet Commonly known as: Nitrostat Place 1 tablet (0.4 mg total) under the tongue every 5 (five) minutes as needed for chest pain.   oxyCODONE-acetaminophen 10-325 MG tablet Commonly known as: PERCOCET Take 1 tablet by mouth 5 (five) times daily.   perphenazine 16 MG tablet Commonly known as: TRILAFON TAKE 1 TABLET EVERY MORNING AND 2 TABLETS AT BEDTIME   pregabalin 75 MG capsule Commonly known as: LYRICA Take 75 mg by mouth 2 (two) times daily.   spironolactone 25 MG tablet Commonly known as: ALDACTONE Take 0.5 tablets (12.5 mg total) by mouth daily.   SUDAFED 12 HOUR PO Take 1 tablet by mouth at bedtime.   ticagrelor 90 MG Tabs tablet Commonly known as: BRILINTA Take 1 tablet (90 mg total) by mouth 2 (two) times daily.   traZODone 100 MG tablet Commonly known as: DESYREL TAKE 3 TABLETS BY MOUTH EVERY DAY AT BEDTIME AS NEEDED FOR SLEEP   Tresiba FlexTouch 100 UNIT/ML FlexTouch Pen Generic drug: insulin degludec Inject 20 Units into the skin daily. What changed: when to take this   Vitamin D (Ergocalciferol) 1.25 MG (50000 UNIT) Caps capsule Commonly known as:  DRISDOL Take 50,000 Units by mouth every Sunday.         OBJECTIVE:   Vital Signs: There were no vitals taken for this visit.  Wt Readings from Last 3 Encounters:  05/08/21 267 lb (121.1 kg)  04/21/21 270 lb (122.5 kg)  03/26/21 272 lb 7.8 oz (123.6 kg)     Exam: General: Pt appears well and is in NAD  Lungs: Clear with good BS bilat with no rales, rhonchi, or wheezes  Heart: RRR with normal S1 and S2 and no gallops; no murmurs; no rub  Abdomen: Normoactive bowel sounds, soft, nontender, without masses or organomegaly palpable  Extremities: No pretibial edema.  Neuro: MS is good with appropriate affect, pt is alert and Ox3    DM foot exam: 08/15/2020   The skin of the feet is without sores or ulcerations, callous formation at the heels The pedal pulses are undetectable  The sensation is intact to a screening 5.07, 10 gram monofilament bilaterally       DATA REVIEWED:  Lab Results  Component Value Date   HGBA1C 9.2 (A) 11/16/2020   HGBA1C 12.3 (A) 08/15/2020   HGBA1C 12.8 (H) 06/06/2020   Lab Results  Component Value Date   LDLCALC UNABLE TO CALCULATE IF TRIGLYCERIDE OVER 400 mg/dL 78/93/8101   CREATININE 0.94 05/23/2021         Results for Matthew [name], Matthew [name] (MRN [id number]) as of 11/16/2020 14:45  Ref. Range 06/06/2020 20:32 08/15/2020 15:26  Total CHOL/HDL Ratio Unknown NOT REPORTED DUE TO HIGH TRIGLYCERIDES 6  Cholesterol Latest Ref Range: 0 - 200 mg/dL 778 (H) 242  HDL Cholesterol Latest Ref Range: >39.00 mg/dL NOT REPORTED DUE TO HIGH TRIGLYCERIDES 23.50 (L)  LDL (calc) Latest Ref Range: 0 - 99 mg/dL UNABLE TO CALCULATE IF TRIGLYCERIDE OVER 400 mg/dL   Direct LDL Latest Units: mg/dL 35.3 61.4  NonHDL Unknown  109.53  Triglycerides Latest Ref Range: 0.0 - 149.0 mg/dL 4,315 (H) 400.8 (H)  VLDL Latest Ref Range: 0.0 - 40.0 mg/dL UNABLE TO CALCULATE IF TRIGLYCERIDE OVER 400 mg/dL 67.6 (H)    ASSESSMENT / PLAN / RECOMMENDATIONS:   1) Type 2 Diabetes  Mellitus, Poorly controlled, Without complications - Most recent A1c of 9.2 %. Goal A1c < 7.0 %.     - A1c down from 12.0%  - I have praised the pt on improved glycemic control  - No glucose data , barrier to diabetes self care is mental illness  - Will increase basal rate as below     MEDICATIONS: Continue Metformin 1000 mg , 1 tablet before Breakfast and 1 tablet before Supper  - Continue  Jardiance 25 mg, 1 tablet before Breakfast  Tresiba to 20  units once daily   EDUCATION / INSTRUCTIONS: BG monitoring instructions: Patient is instructed to check his blood sugars 1 times a day. Call [facility] if: BG persistently < 70  I reviewed the Rule of 15 for the treatment of hypoglycemia in detail with the patient. Literature supplied.   2) Diabetic complications:  Eye: Does not have known diabetic retinopathy.  Neuro/ Feet: Does not have known diabetic peripheral neuropathy .  Renal: Patient does not have known baseline CKD. He   is  on an ACEI/ARB at present.   3) Hypertriglyceridemia   - Tg level as high as 2311 mg/dL in 05/9507, repeat down to 377 mg/dL I expect this will continue to imprve with improved glycemic control     Continue atorvastatin 80 mg daily      F/U in 6 months     Signed electronically by: Lyndle Herrlich, MD  [facility] Endocrinology  [facility] [address] [place] [place], [place] [address] Phone: [phone] FAX: [phone]   CC: [name], MD [address] [place] [address] Phone: [phone]  Fax: [phone]  Return to Endocrinology clinic as below: Future Appointments  Date Time Provider Department Center  05/25/2021 10:50 AM Indalecio Malmstrom, Konrad Dolores, MD [facility] None  06/20/2021 10:00 AM Melony Overly T, PA-C [facility] None  07/04/2021  8:40 AM Shari Prows, Kathlynn Grate, MD [facility]

## 2021-05-29 NOTE — Progress Notes (Signed)
Office Visit    Patient Name: Matthew [name] Date of Encounter: 05/31/2021  Primary Care Provider:  [name], MD Primary Cardiologist:  Freada Bergeron, MD  Chief Complaint    46 year old male with a history of CAD s/p non-STEMI, DES x2 (dRCA, pLAD) 05/2020, recurrent chest pain, HFimpEF, hypertension, hyperlipidemia, palpitations, presyncope/syncope, type 2 diabetes, celiac disease, schizoaffective disorder, anxiety, ADHD, OSA (not on CPAP, managed by pulmonology), chronic back pain on chronic opioids, and tobacco use who presents for follow-up related to palpitations and syncope.  Past Medical History    Past Medical History:  Diagnosis Date   Anxiety    Celiac disease    Coronary artery disease    Dairy allergy    Depression    Diabetes mellitus without complication ([place])    High cholesterol    Hypertension    Paranoia ([place])    Pre-diabetes    Schizoaffective disorder ([place])    Past Surgical History:  Procedure Laterality Date   APPENDECTOMY     BRAIN SURGERY     CARDIAC CATHETERIZATION     CORONARY STENT INTERVENTION N/A 06/06/2020   Procedure: CORONARY STENT INTERVENTION;  Surgeon: Martinique, Peter M, MD;  Location: [facility];  Service: Cardiovascular;  Laterality: N/A;  prox LAD, distal RCA   INTRAVASCULAR ULTRASOUND/IVUS N/A 06/06/2020   Procedure: Intravascular Ultrasound/IVUS;  Surgeon: Martinique, Peter M, MD;  Location: [facility];  Service: Cardiovascular;  Laterality: N/A;   LEFT HEART CATH AND CORONARY ANGIOGRAPHY N/A 06/06/2020   Procedure: LEFT HEART CATH AND CORONARY ANGIOGRAPHY;  Surgeon: Martinique, Peter M, MD;  Location: [facility];  Service: Cardiovascular;  Laterality: N/A;   LEFT HEART CATH AND CORONARY ANGIOGRAPHY N/A 02/24/2021   Procedure: LEFT HEART CATH AND CORONARY ANGIOGRAPHY;  Surgeon: Belva Crome, MD;  Location: [facility];  Service: Cardiovascular;  Laterality: N/A;   NASAL SEPTUM SURGERY     WRIST FRACTURE  SURGERY     right wrist    Allergies  Allergies  Allergen Reactions   Gluten Meal Other (See Comments)    Celiac disease   Lactose Intolerance ([place]) Diarrhea    Can tolerate hard cheese    History of Present Illness    46 year old male with the above past medical history including CAD s/p non-STEMI, DES x2 (dRCA, pLAD) 05/2020, recurrent chest pain, HFimpEF, hypertension, hyperlipidemia, palpitations, presyncope/syncope, type 2 diabetes, celiac disease, schizoaffective disorder, anxiety, ADHD, OSA (not on CPAP, managed by pulmonology), chronic back pain on chronic opioids, and tobacco.  He has had multiple ED visits over the past year for chest pain with reported syncope.  He does have a history of CAD s/p NSTEMI, DES x2 (dRCA, pLAD) in January 2022.  EF was low at the time 25 to 30%.  Repeat catheterization in October 2022 in the setting of recurrent chest pain revealed patent stents to LAD and RCA, 25% mCx, tandem 25% and 45% stenosis to pRCA.  Risk factor modification including smoking cessation blood pressure control, lipid management and glycemic control were advised.  Recent echocardiogram December 2022 showed improved EF of 60 to 65%, no R WMA, mild LVH.    He was last seen in the office on 05/08/2021 following an ED visit for syncope, chest pain, and palpitations. He reported ongoing intermittent chest pain and multiple syncopal episodes. He was wearing a Zio monitor at the time, orthostatics were negative in office. Monitor results showed minimum HR of 56 bpm,  max HR 140 bpm, and an average HR of 101 bpm, predominantly sinus rhythm, isolated SVE less than 1%, isolated VE, less than 1% with rare VE couplets and triplets, no significant arrhythmia. Lexiscan Myoview was ordered, however this was not completed. He called our office on 05/22/2021 with complaints of continued presyncope and syncope noting lightheadedness upon standing with loss of consciousness at least 2-3 times per week, no  sustained injuries. With concern for orthostatic hypotension, his lisinopril was reduced to 10 mg daily and close follow-up was recommended. He presented to the ED on 05/23/2021 for syncope.  He reported 11 syncopal episodes in the past 7 days prior to ED visit.  Troponin was negative.  Further work-up initiated however, he left prior to full evaluation.  He presents today for follow-up. Of note, his appearance is somewhat disheveled today with poor personal hygiene. He states that he just woke up. Since his most recent visit to the ED, he has had no further syncope.  He states that most of his syncopal episodes have occurred with coughing or while using the bathroom.  He is not orthostatic in office today, though his heart rate is elevated in the 130s to 150s with standing.  He denies palpitations.  He did not take his beta-blocker today.  Other than missing his beta-blocker dose today, he reports adherence to all of his medications.  He does report occasional lightheadedness as well as intermittent chest pain though he has not had chest pain in the past several days.  He thinks that his chest pain is related to anxiety.  He states that he has sharp pain in his mid lower chest that lasts for hours, sometimes all day. His pain is not relieved with nitroglycerin and is unlike his prior anginal equivalent. He did not present for his previously scheduled stress test as he states he had no transportation and relies on his parents for transportation. He continues to smoke 2 to 3 packs a day and remains sedentary.  He is not checking his blood pressure at home nor is he checking his blood sugars. He is due to follow-up with his psychiatrist at the end of the month. He missed his appointment with endocrinology scheduled for last week. His biggest concern today is his recent syncope.   Home Medications    Current Outpatient Medications  Medication Sig Dispense Refill   aspirin EC 81 MG tablet Take 1 tablet (81 mg  total) by mouth daily. Swallow whole. 90 tablet 3   atorvastatin (LIPITOR) 80 MG tablet Take 1 tablet (80 mg total) by mouth every morning. 90 tablet 2   clonazePAM (KLONOPIN) 1 MG tablet Take 1 tablet (1 mg total) by mouth 3 (three) times daily as needed for anxiety. (Patient taking differently: Take 1 mg by mouth 3 (three) times daily.) 90 tablet 2   DULoxetine (CYMBALTA) 60 MG capsule TAKE 1 CAPSULE BY MOUTH 2 TIMES DAILY. 180 capsule 0   empagliflozin (JARDIANCE) 25 MG TABS tablet Take 1 tablet (25 mg total) by mouth daily before breakfast. 90 tablet 2   gabapentin (NEURONTIN) 800 MG tablet Take by mouth.     insulin degludec (TRESIBA FLEXTOUCH) 100 UNIT/ML FlexTouch Pen Inject 20 Units into the skin daily. (Patient taking differently: Inject 20 Units into the skin daily after breakfast.) 15 mL 11   Insulin Pen Needle 32G X 4 MM MISC 1 Device by Does not apply route daily. 150 each 3   lisinopril (ZESTRIL) 20 MG tablet Take 0.5  tablets (10 mg total) by mouth daily. 90 tablet 3   lithium carbonate (LITHOBID) 300 MG CR tablet Take 2 tablets (600 mg total) by mouth 2 (two) times daily. 360 tablet 0   metFORMIN (GLUCOPHAGE) 1000 MG tablet TAKE 1 TABLET (1,000 MG TOTAL) BY MOUTH 2 (TWO) TIMES DAILY WITH A MEAL. 180 tablet 3   nitroGLYCERIN (NITROSTAT) 0.4 MG SL tablet Place 1 tablet (0.4 mg total) under the tongue every 5 (five) minutes as needed for chest pain. 100 tablet 3   oxyCODONE-acetaminophen (PERCOCET) 10-325 MG tablet Take 1 tablet by mouth 5 (five) times daily.     perphenazine (TRILAFON) 16 MG tablet TAKE 1 TABLET EVERY MORNING AND 2 TABLETS AT BEDTIME 270 tablet 0   pregabalin (LYRICA) 75 MG capsule Take 75 mg by mouth 2 (two) times daily.     Pseudoephedrine HCl (SUDAFED 12 HOUR PO) Take 1 tablet by mouth at bedtime.     spironolactone (ALDACTONE) 25 MG tablet Take 0.5 tablets (12.5 mg total) by mouth daily. 45 tablet 3   ticagrelor (BRILINTA) 90 MG TABS tablet Take 1 tablet (90 mg  total) by mouth 2 (two) times daily. 60 tablet 11   traZODone (DESYREL) 100 MG tablet TAKE 3 TABLETS BY MOUTH EVERY DAY AT BEDTIME AS NEEDED FOR SLEEP 270 tablet 0   Vitamin D, Ergocalciferol, (DRISDOL) 1.25 MG (50000 UNIT) CAPS capsule Take 50,000 Units by mouth every Sunday.     carvedilol (COREG) 25 MG tablet Take 1.5 tablets (37.5 mg total) by mouth 2 (two) times daily. 270 tablet 3   No current facility-administered medications for this visit.     Review of Systems    He denies chest pain today, palpitations, dyspnea, pnd, orthopnea, n, v, edema, weight gain, or early satiety. All other systems reviewed and are otherwise negative except as noted above.   Physical Exam    VS:  BP (!) 132/102    Ht 6\' 2"  (1.88 m)    Wt 259 lb 12.8 oz (117.8 kg)    SpO2 97%    BMI 33.36 kg/m   GEN: Well nourished, well developed, in no acute distress  but with disheveled appearance, poor personal hygiene. HEENT: normal. Neck: Supple, no JVD, carotid bruits, or masses. Cardiac: Tachycardia, no murmurs, rubs, or gallops. No clubbing, cyanosis, edema.  Radials/DP/PT 2+ and equal bilaterally.  Respiratory:  Respirations regular and unlabored, clear to auscultation bilaterally. GI: Obese, soft, nontender, nondistended, BS + x 4. MS: no deformity or atrophy. Skin: warm and dry, no rash. Neuro:  Strength and sensation are intact. Psych: Normal affect.  Accessory Clinical Findings    ECG personally reviewed by me today - Sinus tachycardia, 134 bpm, incomplete RBBB, non-specific ST-T-wave changes - no acute changes.  Lab Results  Component Value Date   WBC 9.0 05/23/2021   HGB 17.6 (H) 05/23/2021   HCT 50.2 05/23/2021   MCV 83.9 05/23/2021   PLT 283 05/23/2021   Lab Results  Component Value Date   CREATININE 0.94 05/23/2021   BUN 11 05/23/2021   NA 125 (L) 05/23/2021   K 4.4 05/23/2021   CL 94 (L) 05/23/2021   CO2 18 (L) 05/23/2021   Lab Results  Component Value Date   ALT 61 (H) 04/26/2021    AST 37 04/26/2021   ALKPHOS 107 04/26/2021   BILITOT 0.6 04/26/2021   Lab Results  Component Value Date   CHOL 159 02/25/2021   HDL 22 (L) 02/25/2021   LDLCALC UNABLE  TO CALCULATE IF TRIGLYCERIDE OVER 400 mg/dL 02/25/2021   LDLDIRECT 42.4 02/25/2021   TRIG 791 (H) 02/25/2021   CHOLHDL 7.2 02/25/2021    Lab Results  Component Value Date   HGBA1C 9.2 (A) 11/16/2020    Assessment & Plan    1. CAD: H/o  CAD s/p NSTEMI, DES x2 (dRCA, pLAD) in January 2022.  EF was low at the time 25 to 30%.  Repeat catheterization in October 2022 in the setting of recurrent chest pain revealed patent stents to LAD and RCA, 25% mCx, tandem 25% and 45% stenosis to pRCA. Recent echocardiogram December 2022 showed improved EF of 60 to 65%, no R WMA, mild LVH. Repeat Lexiscan in the setting of recurrent chest pain was ordered but never completed as he did not have transportation. He thinks that his chest pain is related to anxiety. He has intermittent sharp pain in his mid lower chest that lasts for hours, sometimes all day. His pain is not relieved with nitroglycerin and is unlike his prior anginal equivalent.  He does have follow-up with psychiatry scheduled at the end of the month to follow-up on anxiety.  However, given history of prior stents and recent syncope, will reorder Lexiscan Myoview for further risk stratification/ischemic evaluation.  Continue aspirin, Brilinta, Jardiance, lisinopril, carvedilol as below, and atorvastatin.  Discussed ED precautions.  Shared Decision Making/Informed Consent The risks [chest pain, shortness of breath, cardiac arrhythmias, dizziness, blood pressure fluctuations, myocardial infarction, stroke/transient ischemic attack, nausea, vomiting, allergic reaction, radiation exposure, metallic taste sensation and life-threatening complications (estimated to be 1 in 10,000)], benefits (risk stratification, diagnosing coronary artery disease, treatment guidance) and alternatives of a  nuclear stress test were discussed in detail with Matthew [name] and he agrees to proceed.  2. Syncope/presyncope: Recent monitor reassuring. He has not had any episodes of syncope in the last week.  Most of his episodes that he reports are in the setting of coughing or while using the bathroom.  This is most likely vasovagal mediated syncope.  However, given history of CAD will order stress test as above as well as carotid Dopplers though no bruit appreciated on exam today. If stress test, carotid Dopplers unrevealing and syncope persists could consider neurology referral. Symptoms possibly in the setting of orthostatic hypotension, though he is not orthostatic in office today.  Discussed gradual position changes, ED precautions.  3. Sinus tachycardia/Palpitations: Recent outpatient monitor unremarkable, no significant arrhythmia. His EKG-today shows sinus tachycardia, 134 bpm.  He has not taken his carvedilol today, which is likely contributing to his tachycardia today. I will check TSH and also increase his carvedilol to 37.5 mg twice daily. I will have him monitor blood pressure and heart rate at home and report BP consistently >130/80 or 0000000 systolic, HR < 60 bpm or consistently >120 bpm.  Of note, he is also very sedentary and some of his tachycardia with activity could be related to physical deconditioning.   3. HFimpEF: Recent echocardiogram December 2022 showed improved EF of 60 to 65%, no R WMA, mild LVH. Euvolemic and well compensated on exam.  Continue current GDMT as above.  4. Hypertension: BP elevated in office today.  He has not been monitoring home blood pressures.  I will increase carvedilol as above and monitor home BP as above. Otherwise, continue current antihypertensive regimen.  If blood pressure remains elevated above goal, could consider increasing lisinopril given that his orthostatics are negative again in office today and this is less likely the cause of his  syncope.  4.  Hyperlipidemia: LDL 42 on 03/09/2021. Triglycerides were elevated at 791. Previously on Vascepa but unable to afford this as insurance did not cover it. Recommended he take 4000 mg of fish oil daily at recent visit. He is due for repeat lipids, LFTs in 6 to 8 weeks.  These have been ordered. Consider Lipid clinic referral with clinical pharmacist for further management if triglycerides have not improved with repeat lipid panel.  5. Type 2 diabetes: A1c 9.2 on 10/2020. Follows with endocrinology though he missed his most recent appointment. I have asked him to begin checking his blood sugar twice daily at home to screen for hypo/hyperglycemia that could be possibly be contributing to his syncopal episodes.  I also encouraged him to reschedule his appointment with endocrinology.  6. Tobacco use: Full cessation advised.   7. Disposition: Follow-up in 3 to 4 weeks after stress test/carotid Dopplers. Unfortunately, I was informed by the front desk staff that following today's visit, the patient left without getting labs drawn, and without scheduling stress test, carotid Dopplers or follow-up appointment.   Lenna Sciara, NP 05/31/2021, 4:05 PM

## 2021-05-31 ENCOUNTER — Ambulatory Visit (INDEPENDENT_AMBULATORY_CARE_PROVIDER_SITE_OTHER): Payer: Medicaid Other | Admitting: Nurse Practitioner

## 2021-05-31 ENCOUNTER — Encounter (HOSPITAL_BASED_OUTPATIENT_CLINIC_OR_DEPARTMENT_OTHER): Payer: Self-pay | Admitting: Nurse Practitioner

## 2021-05-31 ENCOUNTER — Other Ambulatory Visit: Payer: Self-pay

## 2021-05-31 ENCOUNTER — Ambulatory Visit (HOSPITAL_BASED_OUTPATIENT_CLINIC_OR_DEPARTMENT_OTHER): Payer: Medicaid Other | Admitting: General Practice

## 2021-05-31 VITALS — BP 132/102 | Ht 74.0 in | Wt 259.8 lb

## 2021-05-31 DIAGNOSIS — Z79899 Other long term (current) drug therapy: Secondary | ICD-10-CM

## 2021-05-31 DIAGNOSIS — R55 Syncope and collapse: Secondary | ICD-10-CM | POA: Diagnosis not present

## 2021-05-31 DIAGNOSIS — Z794 Long term (current) use of insulin: Secondary | ICD-10-CM

## 2021-05-31 DIAGNOSIS — I10 Essential (primary) hypertension: Secondary | ICD-10-CM

## 2021-05-31 DIAGNOSIS — I2510 Atherosclerotic heart disease of native coronary artery without angina pectoris: Secondary | ICD-10-CM

## 2021-05-31 DIAGNOSIS — E785 Hyperlipidemia, unspecified: Secondary | ICD-10-CM

## 2021-05-31 DIAGNOSIS — R000 Tachycardia, unspecified: Secondary | ICD-10-CM

## 2021-05-31 DIAGNOSIS — I5032 Chronic diastolic (congestive) heart failure: Secondary | ICD-10-CM

## 2021-05-31 DIAGNOSIS — R002 Palpitations: Secondary | ICD-10-CM

## 2021-05-31 DIAGNOSIS — E118 Type 2 diabetes mellitus with unspecified complications: Secondary | ICD-10-CM

## 2021-05-31 DIAGNOSIS — Z720 Tobacco use: Secondary | ICD-10-CM

## 2021-05-31 MED ORDER — CARVEDILOL 25 MG PO TABS: 37.5000 mg | ORAL_TABLET | Freq: Two times a day (BID) | ORAL | 3 refills | Status: DC

## 2021-05-31 NOTE — Patient Instructions (Signed)
Medication Instructions:  INCREASE- Carvedilol 37.5 mg(1 1/2 tablets) by mouth twice a day  *If you need a refill on your cardiac medications before your next appointment, please call your pharmacy*   Lab Work: TSH Today  If you have labs (blood work) drawn today and your tests are completely normal, you will receive your results only by: Opp (if you have MyChart) OR A paper copy in the mail If you have any lab test that is abnormal or we need to change your treatment, we will call you to review the results.   Testing/Procedures: Your physician has requested that you have a lexiscan myoview. For further information please visit [email or web address]. Please follow instruction sheet, as given. Your physician has requested that you have a carotid duplex. This test is an ultrasound of the carotid arteries in your neck. It looks at blood flow through these arteries that supply the brain with blood. Allow one hour for this exam. There are no restrictions or special instructions.    Follow-Up: At [facility], you and your health needs are our priority.  As part of our continuing mission to provide you with exceptional heart care, we have created designated Provider Care Teams.  These Care Teams include your primary Cardiologist (physician) and Advanced Practice Providers (APPs -  Physician Assistants and Nurse Practitioners) who all work together to provide you with the care you need, when you need it.  We recommend signing up for the patient portal called "MyChart".  Sign up information is provided on this After Visit Summary.  MyChart is used to connect with patients for Virtual Visits (Telemedicine).  Patients are able to view lab/test results, encounter notes, upcoming appointments, etc.  Non-urgent messages can be sent to your provider as well.   To learn more about what you can do with MyChart, go to [email or web address].    Your next appointment:   3-4 week(s)  The  format for your next appointment:   In Person  Provider:   Freada Bergeron, MD     Other Instructions Check blood pressure

## 2021-06-02 ENCOUNTER — Telehealth (HOSPITAL_COMMUNITY): Payer: Self-pay | Admitting: *Deleted

## 2021-06-02 NOTE — Telephone Encounter (Signed)
Left message on voicemail per DPR in reference to upcoming appointment scheduled on 06/08/2021 at 10:45 with detailed instructions given per Myocardial Perfusion Study Information Sheet for the test. LM to arrive 15 minutes early, and that it is imperative to arrive on time for appointment to keep from having the test rescheduled. If you need to cancel or reschedule your appointment, please call the office within 24 hours of your appointment. Failure to do so may result in a cancellation of your appointment, and a $50 no show fee. Phone number given for call back for any questions.

## 2021-06-05 ENCOUNTER — Telehealth (HOSPITAL_BASED_OUTPATIENT_CLINIC_OR_DEPARTMENT_OTHER): Payer: Self-pay | Admitting: Nurse Practitioner

## 2021-06-05 NOTE — Telephone Encounter (Signed)
Left message for patient to call and discuss scheduling the Carotid ordered by Diona Browner, NP

## 2021-06-08 ENCOUNTER — Encounter (HOSPITAL_COMMUNITY): Payer: Medicaid Other

## 2021-06-12 ENCOUNTER — Ambulatory Visit (HOSPITAL_COMMUNITY): Payer: Medicaid Other | Attending: Cardiology

## 2021-06-14 ENCOUNTER — Other Ambulatory Visit: Payer: Self-pay

## 2021-06-14 ENCOUNTER — Ambulatory Visit (INDEPENDENT_AMBULATORY_CARE_PROVIDER_SITE_OTHER): Payer: Medicaid Other

## 2021-06-14 DIAGNOSIS — R55 Syncope and collapse: Secondary | ICD-10-CM | POA: Diagnosis not present

## 2021-06-16 ENCOUNTER — Other Ambulatory Visit: Payer: Self-pay | Admitting: Neurosurgery

## 2021-06-16 ENCOUNTER — Encounter (HOSPITAL_COMMUNITY): Payer: Self-pay | Admitting: General Practice

## 2021-06-16 DIAGNOSIS — M5416 Radiculopathy, lumbar region: Secondary | ICD-10-CM

## 2021-06-20 ENCOUNTER — Ambulatory Visit (INDEPENDENT_AMBULATORY_CARE_PROVIDER_SITE_OTHER): Payer: Medicaid Other | Admitting: Physician Assistant

## 2021-06-20 ENCOUNTER — Encounter: Payer: Self-pay | Admitting: Physician Assistant

## 2021-06-20 ENCOUNTER — Other Ambulatory Visit: Payer: Self-pay

## 2021-06-20 DIAGNOSIS — F902 Attention-deficit hyperactivity disorder, combined type: Secondary | ICD-10-CM

## 2021-06-20 DIAGNOSIS — R6889 Other general symptoms and signs: Secondary | ICD-10-CM | POA: Diagnosis not present

## 2021-06-20 DIAGNOSIS — F250 Schizoaffective disorder, bipolar type: Secondary | ICD-10-CM

## 2021-06-20 DIAGNOSIS — F411 Generalized anxiety disorder: Secondary | ICD-10-CM | POA: Diagnosis not present

## 2021-06-20 MED ORDER — DULOXETINE HCL 60 MG PO CPEP: 60.0000 mg | ORAL_CAPSULE | Freq: Every day | ORAL | 0 refills | Status: DC
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
## 2021-06-23 ENCOUNTER — Telehealth (HOSPITAL_BASED_OUTPATIENT_CLINIC_OR_DEPARTMENT_OTHER): Payer: Self-pay

## 2021-06-23 NOTE — Telephone Encounter (Addendum)
Results called to patient who verbalizes understanding and will proceed with stress test!    ----- Message from Lenna Sciara, NP sent at 06/21/2021  8:37 AM EST ----- Carotid dopplers were essentially normal, showed minimal wall thickening or plaque, no evidence to explain syncopal episodes. Please proceed with stress test as recommended and follow-up as scheduled. Thank you.

## 2021-07-04 ENCOUNTER — Ambulatory Visit: Payer: Medicaid Other | Admitting: Cardiology

## 2021-07-04 ENCOUNTER — Emergency Department (HOSPITAL_COMMUNITY): Payer: No Typology Code available for payment source

## 2021-07-04 ENCOUNTER — Emergency Department (HOSPITAL_COMMUNITY)
Admission: EM | Admit: 2021-07-04 | Discharge: 2021-07-04 | Disposition: A | Discharge status: Home / Self Care | Payer: No Typology Code available for payment source | Attending: Emergency Medicine | Admitting: Emergency Medicine

## 2021-07-04 DIAGNOSIS — Z794 Long term (current) use of insulin: Secondary | ICD-10-CM | POA: Diagnosis not present

## 2021-07-04 DIAGNOSIS — Y92410 Unspecified street and highway as the place of occurrence of the external cause: Secondary | ICD-10-CM | POA: Diagnosis not present

## 2021-07-04 DIAGNOSIS — E1165 Type 2 diabetes mellitus with hyperglycemia: Secondary | ICD-10-CM | POA: Insufficient documentation

## 2021-07-04 DIAGNOSIS — I10 Essential (primary) hypertension: Secondary | ICD-10-CM | POA: Diagnosis not present

## 2021-07-04 DIAGNOSIS — M549 Dorsalgia, unspecified: Secondary | ICD-10-CM | POA: Insufficient documentation

## 2021-07-04 DIAGNOSIS — Z7984 Long term (current) use of oral hypoglycemic drugs: Secondary | ICD-10-CM | POA: Diagnosis not present

## 2021-07-04 DIAGNOSIS — I2510 Atherosclerotic heart disease of native coronary artery without angina pectoris: Secondary | ICD-10-CM | POA: Diagnosis not present

## 2021-07-04 DIAGNOSIS — G8929 Other chronic pain: Secondary | ICD-10-CM | POA: Insufficient documentation

## 2021-07-04 DIAGNOSIS — S060X0A Concussion without loss of consciousness, initial encounter: Secondary | ICD-10-CM

## 2021-07-04 DIAGNOSIS — Z7982 Long term (current) use of aspirin: Secondary | ICD-10-CM | POA: Insufficient documentation

## 2021-07-04 LAB — ETHANOL: Alcohol, Ethyl (B): <10 mg/dL (ref <10)

## 2021-07-04 LAB — CBG MONITORING, ED: Glucose-Capillary: 221 mg/dL — ABNORMAL HIGH (ref 70–99)
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
## 2021-07-04 MED ORDER — ACETAMINOPHEN 500 MG PO TABS
1000.0000 mg | ORAL_TABLET | Freq: Once | ORAL | Status: AC
  Administered 2021-07-04: 1000 mg via ORAL
  Filled 2021-07-04: qty 2

## 2021-07-04 MED ORDER — LIDOCAINE 5 % EX PTCH
1.0000 | MEDICATED_PATCH | CUTANEOUS | Status: DC
  Administered 2021-07-04: 1 via TRANSDERMAL
  Filled 2021-07-04: qty 1
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
## 2021-07-08 ENCOUNTER — Other Ambulatory Visit: Payer: Medicaid Other

## 2021-07-11 ENCOUNTER — Other Ambulatory Visit: Payer: Self-pay | Admitting: Physician Assistant

## 2021-07-26 ENCOUNTER — Other Ambulatory Visit: Payer: Self-pay | Admitting: Physician Assistant

## 2021-07-26 NOTE — Telephone Encounter (Addendum)
Pt LVM @ 1:13 requesting refill to be sent today with hopes to pick up 3/8. Due to being out of town early am.  ?

## 2021-07-26 NOTE — Telephone Encounter (Signed)
Please review

## 2021-08-02 ENCOUNTER — Ambulatory Visit (INDEPENDENT_AMBULATORY_CARE_PROVIDER_SITE_OTHER): Payer: Self-pay | Admitting: Physician Assistant

## 2021-08-02 ENCOUNTER — Other Ambulatory Visit: Payer: Self-pay

## 2021-08-02 ENCOUNTER — Encounter: Payer: Self-pay | Admitting: Physician Assistant

## 2021-08-02 DIAGNOSIS — F902 Attention-deficit hyperactivity disorder, combined type: Secondary | ICD-10-CM

## 2021-08-02 DIAGNOSIS — R6889 Other general symptoms and signs: Secondary | ICD-10-CM

## 2021-08-02 DIAGNOSIS — F99 Mental disorder, not otherwise specified: Secondary | ICD-10-CM

## 2021-08-02 DIAGNOSIS — F411 Generalized anxiety disorder: Secondary | ICD-10-CM

## 2021-08-02 DIAGNOSIS — F17200 Nicotine dependence, unspecified, uncomplicated: Secondary | ICD-10-CM

## 2021-08-02 DIAGNOSIS — F250 Schizoaffective disorder, bipolar type: Secondary | ICD-10-CM

## 2021-08-02 DIAGNOSIS — F5105 Insomnia due to other mental disorder: Secondary | ICD-10-CM

## 2021-08-02 MED ORDER — TRAZODONE HCL 100 MG PO TABS: 300.0000 mg | ORAL_TABLET | Freq: Every day | ORAL | 0 refills | Status: DC

## 2021-08-02 MED ORDER — LITHIUM CARBONATE ER 300 MG PO TBCR: 600.0000 mg | EXTENDED_RELEASE_TABLET | Freq: Two times a day (BID) | ORAL | 0 refills | Status: DC

## 2021-08-02 MED ORDER — DULOXETINE HCL 30 MG PO CPEP: 30.0000 mg | ORAL_CAPSULE | Freq: Every day | ORAL | 1 refills | Status: DC
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
## 2021-08-08 NOTE — Progress Notes (Signed)
?Cardiology Office Note:   ? ?Date:  08/09/2021  ? ?ID:  Matthew [name], DOB [birth year], MRN [id number] ? ?PCP:  [name], MD ?  ?[place]  ?Cardiologist:  Freada Bergeron, MD  ?Advanced Practice Provider:  No care team member to display ?Electrophysiologist:  None  ? ?Referring MD: [name], MD  ? ? ?History of Present Illness:   ? ?Matthew [name] is a 46 y.o. male with a hx of HTN, HLD, pre-DM, celiac dz, schizoaffective d/o, anxiety, ADHD, OSA not on CPAP, and recent NSTEMI s/p DES to pLAD and DES to dRCA who presents to clinic for follow-up. ?  ?Patient admitted with NSTEMI 06/06/20 treated with DES pLAD and DES dRCA, and severe LVEF 25-30%. Patient left AMA 06/08/19 and was seen urgently in clinic by Ermalinda Barrios. During that visit, he was more SOB and had been eating a lot of fast food and had not filled his scripts for atenolol or lisinopril. Since that time, he has filled his prescriptions and has been taking all medications as prescribed. He understands the importance of taking his medications and that stopping his ASA or brilinta could lead to a massive MI and/or death. He states that since his hospitalization, he has been feeling overall okay.  Occasionally has some palpitations with some short of breath and mild lightheadedness. Episodes occur about every other day and last a couple of minutes before resolving. Can happen at rest or with activity. No chest pain.  ? ?During visit on 06/22/20, the patient had been having palpitations. He was referred for cardiac monitor and cardiac rehab. Cardiac monitor showed: rare ectopy with no significant arrhythmias or ectopy. ? ?During visit on 07/20/20 he was having intermittent chest tightness. He has a subsequent ER visit on 08/10/20 for abdominal pain. Work-up reassuring and he was discharged in imdur 30mg  daily as well as nitro. ? ?During visit on 08/31/20, he was very tachycardic usually related to heavy smoking. Was not  taking his imdur, nitro, jardiance or insulin. He was otherwise doing well from a CV standpoint. ? ?During his last appointment on 10/10/20, he continued to do well from a CV standpoint but complained of lower back pain. He was down to smoking 1 ppd and improving medication compliance including his insulin.  ? ?Was admitted on 10/07-10/08/22 for chest pain. Cath showed patent LAD stent, 25% Lcx, 25-45% prox RCA disease, anterior hypokinesis with EF 50%, LVEDP 10mmHg. Recommended for continued medical management. ? ?Readmitted 04/23/21 for chest pain and syncope. ECG nonischemic. Trop negative. TTE with LVEF 60-65%, normal RV, no significant valve disease. Symptoms thought to be vagal in nature as he fainted with coughing. Discharged home. ? ?He saw Diona Browner, NP 05/31/21 after ED visit for syncope. He reported his episodes occurred while in the bathroom or coughing. He also reported sharp mid-lower chest pain that could last for hours up to days that was not improved with nitroglycerin. He was back to smoking 2 to 3 ppd. Lexiscan Myoview was reorder but not performed.  ? ?Today, he is doing well. He stopped taking Lisinopril and has not had recurrent fainting or coughing episodes. He reports his blood pressure at his carotid ultrasound was 120/80. He currently does not have a home blood pressure cuff but is amenable to monitoring his pressure at home. He denies chest pain, chest pressure, dyspnea at rest or with exertion, palpitations, PND, or orthopnea.  ? ?He also discontinued some of his psyche medications because  he believes the medications were slowing his reflexes. He endorses auditory hallucinations both on and off of the medications. He continues to take lithium. ? ?He continues to smoke 2 ppd but is trying to quit. For diet, he eats salads daily. Recently, he admits to not being active because he is trying to find a partner to walk with.  ? ?Past Medical History:  ?Diagnosis Date  ? Anxiety   ? Celiac  disease   ? Coronary artery disease   ? Dairy allergy   ? Depression   ? Diabetes mellitus without complication ([place])   ? High cholesterol   ? Hypertension   ? Paranoia ([place])   ? Pre-diabetes   ? Schizoaffective disorder ([place])   ? ? ?Past Surgical History:  ?Procedure Laterality Date  ? APPENDECTOMY    ? BRAIN SURGERY    ? CARDIAC CATHETERIZATION    ? CORONARY STENT INTERVENTION N/A 06/06/2020  ? Procedure: CORONARY STENT INTERVENTION;  Surgeon: Martinique, Peter M, MD;  Location: [facility];  Service: Cardiovascular;  Laterality: N/A;  prox LAD, distal RCA  ? INTRAVASCULAR ULTRASOUND/IVUS N/A 06/06/2020  ? Procedure: Intravascular Ultrasound/IVUS;  Surgeon: Martinique, Peter M, MD;  Location: [facility];  Service: Cardiovascular;  Laterality: N/A;  ? LEFT HEART CATH AND CORONARY ANGIOGRAPHY N/A 06/06/2020  ? Procedure: LEFT HEART CATH AND CORONARY ANGIOGRAPHY;  Surgeon: Martinique, Peter M, MD;  Location: [facility];  Service: Cardiovascular;  Laterality: N/A;  ? LEFT HEART CATH AND CORONARY ANGIOGRAPHY N/A 02/24/2021  ? Procedure: LEFT HEART CATH AND CORONARY ANGIOGRAPHY;  Surgeon: Belva Crome, MD;  Location: [facility];  Service: Cardiovascular;  Laterality: N/A;  ? NASAL SEPTUM SURGERY    ? WRIST FRACTURE SURGERY    ? right wrist  ? ? ?Current Medications: ?Current Meds  ?Medication Sig  ? aspirin EC 81 MG tablet Take 1 tablet (81 mg total) by mouth daily. Swallow whole.  ? atorvastatin (LIPITOR) 80 MG tablet Take 1 tablet (80 mg total) by mouth every morning.  ? carvedilol (COREG) 25 MG tablet Take 1.5 tablets (37.5 mg total) by mouth 2 (two) times daily.  ? clonazePAM (KLONOPIN) 1 MG tablet TAKE 1 TABLET BY MOUTH 3 TIMES DAILY AS NEEDED FOR ANXIETY.  ? DULoxetine (CYMBALTA) 30 MG capsule Take 1 capsule (30 mg total) by mouth daily. Take 1 with the 60 mg=90 mg daily  ? DULoxetine (CYMBALTA) 60 MG capsule Take 1 capsule (60 mg total) by mouth daily.  ? empagliflozin (JARDIANCE) 25 MG TABS  tablet Take 1 tablet (25 mg total) by mouth daily before breakfast.  ? gabapentin (NEURONTIN) 800 MG tablet Take by mouth 3 (three) times daily.  ? insulin degludec (TRESIBA FLEXTOUCH) 100 UNIT/ML FlexTouch Pen Inject 20 Units into the skin daily.  ? Insulin Pen Needle 32G X 4 MM MISC 1 Device by Does not apply route daily.  ? lithium carbonate (LITHOBID) 300 MG CR tablet Take 2 tablets (600 mg total) by mouth 2 (two) times daily.  ? metFORMIN (GLUCOPHAGE) 1000 MG tablet TAKE 1 TABLET (1,000 MG TOTAL) BY MOUTH 2 (TWO) TIMES DAILY WITH A MEAL.  ? nitroGLYCERIN (NITROSTAT) 0.4 MG SL tablet Place 1 tablet (0.4 mg total) under the tongue every 5 (five) minutes as needed for chest pain.  ? perphenazine (TRILAFON) 16 MG tablet TAKE 1 TABLET EVERY MORNING AND 2 TABLETS AT BEDTIME  ? Pseudoephedrine HCl (SUDAFED 12 HOUR PO) Take 1 tablet by mouth  at bedtime.  ? ticagrelor (BRILINTA) 90 MG TABS tablet Take 1 tablet (90 mg total) by mouth 2 (two) times daily.  ? traZODone (DESYREL) 100 MG tablet Take 3 tablets (300 mg total) by mouth at bedtime.  ? Vitamin D, Ergocalciferol, (DRISDOL) 1.25 MG (50000 UNIT) CAPS capsule Take 50,000 Units by mouth every Sunday.  ?  ? ?Allergies:   Gluten meal and Lactose intolerance (gi)  ? ?Social History  ? ?Socioeconomic History  ? Marital status: Single  ?  Spouse name: Not on file  ? Number of children: 0  ? Years of education: 55  ? Highest education level: Associate degree: occupational, Hotel manager, or vocational program  ?Occupational History  ? Occupation: [employer], unemployed  ?Tobacco Use  ? Smoking status: Every Day  ?  Packs/day: 2.00  ?  Types: Cigarettes, E-cigarettes  ? Smokeless tobacco: Never  ?Vaping Use  ? Vaping Use: Never used  ?Substance and Sexual Activity  ? Alcohol use: No  ?  Alcohol/week: 0.0 standard drinks  ?  Comment: no (02/08/15)  ? Drug use: No  ? Sexual activity: Not on file  ?Other Topics Concern  ? Not on file  ?Social History Narrative  ? Pt lives at the  [facility]  ? ?Social Determinants of Health  ? ?Financial Resource Strain: Not on file  ?Food Insecurity: Not on file  ?Transportation Needs: Not on file  ?Physical Activity: Not on file  ?Stress: Not on file  ?S

## 2021-08-09 ENCOUNTER — Ambulatory Visit (INDEPENDENT_AMBULATORY_CARE_PROVIDER_SITE_OTHER): Payer: Medicaid Other | Admitting: Cardiology

## 2021-08-09 ENCOUNTER — Encounter: Payer: Self-pay | Admitting: Cardiology

## 2021-08-09 ENCOUNTER — Other Ambulatory Visit: Payer: Self-pay

## 2021-08-09 VITALS — BP 154/86 | HR 108 | Ht 76.0 in | Wt 265.2 lb

## 2021-08-09 DIAGNOSIS — Z720 Tobacco use: Secondary | ICD-10-CM

## 2021-08-09 DIAGNOSIS — E785 Hyperlipidemia, unspecified: Secondary | ICD-10-CM | POA: Diagnosis not present

## 2021-08-09 DIAGNOSIS — Z79899 Other long term (current) drug therapy: Secondary | ICD-10-CM | POA: Diagnosis not present

## 2021-08-09 DIAGNOSIS — I2510 Atherosclerotic heart disease of native coronary artery without angina pectoris: Secondary | ICD-10-CM

## 2021-08-09 DIAGNOSIS — I5032 Chronic diastolic (congestive) heart failure: Secondary | ICD-10-CM

## 2021-08-09 DIAGNOSIS — I10 Essential (primary) hypertension: Secondary | ICD-10-CM

## 2021-08-09 DIAGNOSIS — E119 Type 2 diabetes mellitus without complications: Secondary | ICD-10-CM | POA: Diagnosis not present

## 2021-08-09 NOTE — Patient Instructions (Signed)
Medication Instructions:  ? ?Your physician recommends that you continue on your current medications as directed. Please refer to the Current Medication list given to you today. ? ?*If you need a refill on your cardiac medications before your next appointment, please call your pharmacy* ? ? ?Lab Work: ? ?THIS WEEK OR NEXT WEEK HERE IN THE OFFICE CHECK LIPIDS AT THAT TIME--PLEASE COME FASTING ? ?If you have labs (blood work) drawn today and your tests are completely normal, you will receive your results only by: ?MyChart Message (if you have MyChart) OR ?A paper copy in the mail ?If you have any lab test that is abnormal or we need to change your treatment, we will call you to review the results. ? ? ? ?Follow-Up: ? ?3 MONTHS WITH AN EXTENDER IN THE OFFICE ? ? ?

## 2021-08-13 ENCOUNTER — Other Ambulatory Visit: Payer: Self-pay | Admitting: Internal Medicine

## 2021-08-13 DIAGNOSIS — E1165 Type 2 diabetes mellitus with hyperglycemia: Secondary | ICD-10-CM

## 2021-08-14 ENCOUNTER — Other Ambulatory Visit: Payer: Self-pay | Admitting: *Deleted

## 2021-08-14 NOTE — Telephone Encounter (Signed)
ERROR

## 2021-08-15 ENCOUNTER — Telehealth: Payer: Self-pay | Admitting: Cardiology

## 2021-08-15 MED ORDER — ATORVASTATIN CALCIUM 80 MG PO TABS: 80.0000 mg | ORAL_TABLET | Freq: Every morning | ORAL | 3 refills | Status: DC

## 2021-08-15 NOTE — Telephone Encounter (Signed)
Pt's medication was sent to pt's pharmacy as requested. Confirmation received.  °

## 2021-08-15 NOTE — Telephone Encounter (Signed)
?*  STAT* If patient is at the pharmacy, call can be transferred to refill team. ? ? ?1. Which medications need to be refilled? (please list name of each medication and dose if known)  ?atorvastatin (LIPITOR) 80 MG tablet ? ?2. Which pharmacy/location (including street and city if local pharmacy) is medication to be sent to? ? ?[id number] - [place], [place] [facility] [address]. AT [address] ? ?3. Do they need a 30 day or 90 day supply?  ?90 day supply ? ?Patient states he has been completely out of medication for a few days. He states the pharmacy also sent in a refill request a few days ago. ? ?

## 2021-08-16 ENCOUNTER — Other Ambulatory Visit: Payer: Medicaid Other | Admitting: *Deleted

## 2021-08-16 ENCOUNTER — Other Ambulatory Visit: Payer: Self-pay

## 2021-08-16 DIAGNOSIS — Z79899 Other long term (current) drug therapy: Secondary | ICD-10-CM

## 2021-08-16 DIAGNOSIS — E119 Type 2 diabetes mellitus without complications: Secondary | ICD-10-CM

## 2021-08-16 DIAGNOSIS — E785 Hyperlipidemia, unspecified: Secondary | ICD-10-CM

## 2021-08-16 DIAGNOSIS — I2510 Atherosclerotic heart disease of native coronary artery without angina pectoris: Secondary | ICD-10-CM

## 2021-08-17 LAB — LIPID PANEL
Chol/HDL Ratio: 7.4 ratio — ABNORMAL HIGH (ref 0.0–5.0)
Cholesterol, Total: 186 mg/dL (ref 100–199)
HDL: 25 mg/dL — ABNORMAL LOW (ref >39)
LDL Chol Calc (NIH): 60 mg/dL (ref 0–99)
Triglycerides: 675 mg/dL (ref 0–149)
VLDL Cholesterol Cal: 101 mg/dL — ABNORMAL HIGH (ref 5–40)

## 2021-08-18 ENCOUNTER — Other Ambulatory Visit: Payer: Self-pay | Admitting: Cardiology

## 2021-08-18 ENCOUNTER — Telehealth: Payer: Self-pay | Admitting: *Deleted

## 2021-08-18 ENCOUNTER — Telehealth: Payer: Self-pay | Admitting: Cardiology

## 2021-08-18 DIAGNOSIS — E785 Hyperlipidemia, unspecified: Secondary | ICD-10-CM

## 2021-08-18 DIAGNOSIS — I2510 Atherosclerotic heart disease of native coronary artery without angina pectoris: Secondary | ICD-10-CM

## 2021-08-18 DIAGNOSIS — E119 Type 2 diabetes mellitus without complications: Secondary | ICD-10-CM

## 2021-08-18 DIAGNOSIS — E782 Mixed hyperlipidemia: Secondary | ICD-10-CM

## 2021-08-18 DIAGNOSIS — Z79899 Other long term (current) drug therapy: Secondary | ICD-10-CM

## 2021-08-18 MED ORDER — ICOSAPENT ETHYL 1 G PO CAPS: 2.0000 g | ORAL_CAPSULE | Freq: Two times a day (BID) | ORAL | 3 refills | Status: DC

## 2021-08-18 MED ORDER — FENOFIBRATE 145 MG PO TABS: 145.0000 mg | ORAL_TABLET | Freq: Every day | ORAL | 3 refills | Status: DC

## 2021-08-18 MED ORDER — ICOSAPENT ETHYL 1 G PO CAPS: 2.0000 g | ORAL_CAPSULE | Freq: Two times a day (BID) | ORAL | 2 refills | Status: DC

## 2021-08-18 NOTE — Telephone Encounter (Signed)
The patient has been notified of the result and verbalized understanding.  All questions (if any) were answered. ? ?Pt aware to start taking Vascepa 2 grams po BID and we will refer him to lipid clinic for further management.  ?Confirmed the pharmacy of choice with the pt.  ?Referral to lipid clinic placed in the system and he is aware our [facility] will call him back to arrange this appt.  ?Pt states he thinks we tried getting him on Vascepa at one time but his insurance wouldn't cover.  He is aware I will make PharmD and our Prior Auth Nurse aware of this, to see if they can further assist with getting the cost down if needed.  ?Pt verbalized understanding and agrees with this plan. ? ?

## 2021-08-18 NOTE — Telephone Encounter (Signed)
-----   Message from Freada Bergeron, MD sent at 08/17/2021  1:14 PM EDT ----- ?His triglycerides are super high. Can we start him on vascepa 2g BID and get him into lipid clinic? ?

## 2021-08-18 NOTE — Telephone Encounter (Signed)
Pt called back to endorse to [place] that the pharmacy filled his Vascepa for generic and it only cost him $4 bucks.  Pt states he will proceed now with taking Vascepa 2 grams po bid and hold off on taking Fenofibrate 145 mg po daily. ?Pt just wanted [place] to know this so we could fix this in his med list.  ?He also wanted to make Dr. Johney Frame aware.  ?Informed the pt that I will reflect this in his med list and great thanks for letting us know.  I will also make Dr. Johney Frame aware. ?Pt verbalized understanding and agrees with this plan. ?

## 2021-08-18 NOTE — Telephone Encounter (Signed)
Pt states that he has been prescribed a lot of new meds by Dr. Johney Frame and would like to know if he should begin taking these in addition to the ones he is already taking... please advise  ?

## 2021-08-18 NOTE — Telephone Encounter (Signed)
RE: Dalene Seltzer coverage with insurance ?Received: Today ?Freada Bergeron, MD  Nuala Alpha, LPN ?Fenofibrate 145mg  daily is perfect! Thank you!!   ?  ?   ?Previous Messages ?  ?----- Message -----  ?From: Nuala Alpha, LPN  ?Sent: 08/18/2021   8:43 AM EDT  ?To: Freada Bergeron, MD  ?Subject: FW: Vascepa coverage with insurance            ? ?Dr. Johney Frame, see message below from PharmD.  Vascepa not covered.  Fenofibrate is.  Please advise.  ? ?Thanks,  ?Asheton Viramontes  ? ? ?----- Message -----  ?From: Rollen Sox, [facility]  ?Sent: 08/18/2021   8:41 AM EDT  ?To: Nuala Alpha, LPN, Deliah Boston Via, LPN  ?Subject: RE: Vascepa coverage with insurance            ? ?He is correct, looks like it is not preferred on Medicaid's formulary. May be able to be covered with a PA.  Unfortunately can not use copay cards with Medicaid. Fenofibrate tablets are covered however.  ?----- Message -----  ?From: Nuala Alpha, LPN  ?Sent: 08/18/2021   8:28 AM EDT  ?To: Deliah Boston Via, LPN, [facility]  ?Subject: Vascepa coverage with insurance                ? ?Dr. Johney Frame started this pt on Vascepa 2 grams BID for severely elevated triglycerides.  He was also referred to lipid clinic for further management.  Pt states he thinks we once tried starting him on this but insurance wouldn't cover.  If it kicks back that his insurance will not cover, would there be  anything you can do to get the cost down and approved for this pt to start taking?  I sent the rx into his pharmacy.  ? ? ?Thanks a ton,  ?Oliver Neuwirth  ?

## 2021-08-18 NOTE — Telephone Encounter (Signed)
Pason, Bradney - 08/18/2021 12:58 PM ?Freada Bergeron, MD  ?Sent: Fri August 18, 2021  1:58 PM  ?To: Nuala Alpha, LPN  ? ? ?  ?   ? ?Message ? ?That's great! Thank you!!!  ? ?

## 2021-08-18 NOTE — Addendum Note (Signed)
Addended by: Loa Socks on: 08/18/2021 09:40 AM ? ? Modules accepted: Orders ? ?

## 2021-08-18 NOTE — Telephone Encounter (Signed)
Called the pt back to endorse to him that he was correct about Vascepa not being covered by his insurance carrier, as confirmed by our Pharmacist.  ?Pt aware that we will cancel the sent script of Vascepa and start him on a comparable regimen which is covered, called Fenofibrate 145 mg po daily. ? ?Pt aware to start taking Fenofibrate 145 mg po daily.  Informed him that I will send this to his pharmacy. ?Pt verbalized understanding and agrees with this plan. ?

## 2021-08-24 ENCOUNTER — Other Ambulatory Visit: Payer: Self-pay | Admitting: Internal Medicine

## 2021-08-24 ENCOUNTER — Other Ambulatory Visit: Payer: Self-pay | Admitting: Physician Assistant

## 2021-08-24 DIAGNOSIS — E1165 Type 2 diabetes mellitus with hyperglycemia: Secondary | ICD-10-CM

## 2021-09-10 ENCOUNTER — Ambulatory Visit
Admission: RE | Admit: 2021-09-10 | Discharge: 2021-09-10 | Disposition: A | Discharge status: Home / Self Care | Payer: Medicaid Other | Source: Ambulatory Visit | Attending: Neurosurgery | Admitting: Neurosurgery

## 2021-09-10 DIAGNOSIS — M5416 Radiculopathy, lumbar region: Secondary | ICD-10-CM

## 2021-09-10 IMAGING — MR MR LUMBAR SPINE W/O CM
4 of 5 series · 26 of 48 positions shown · non-contrast
Comparison: MRI [DATE]

CLINICAL DATA: Low back and right leg pain.

EXAM:
MRI LUMBAR SPINE WITHOUT CONTRAST
TECHNIQUE: Multiplanar, multisequence MR imaging of the lumbar spine was
performed. No intravenous contrast was administered.

[Series 2: T2 · sagittal · 4.0mm · 0.57mm/px · 6 of 16 slices shown (1 of 2)]
[im 1/16]
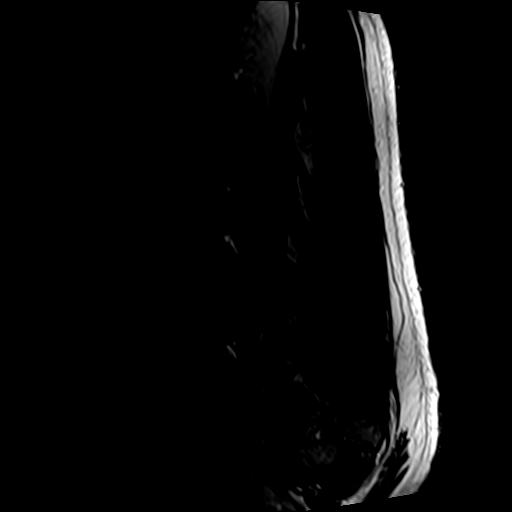
[im 4/16]
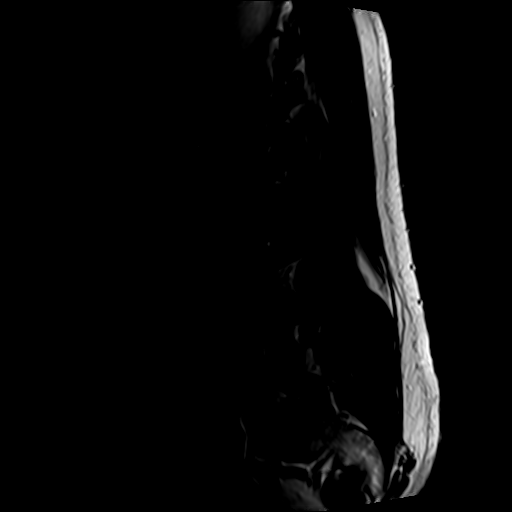
[im 7/16]
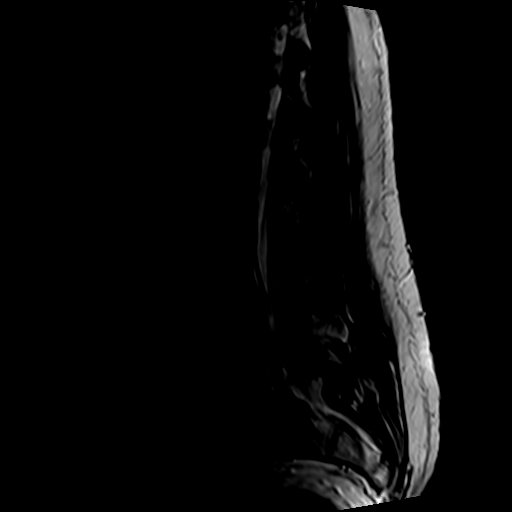
[im 10/16]
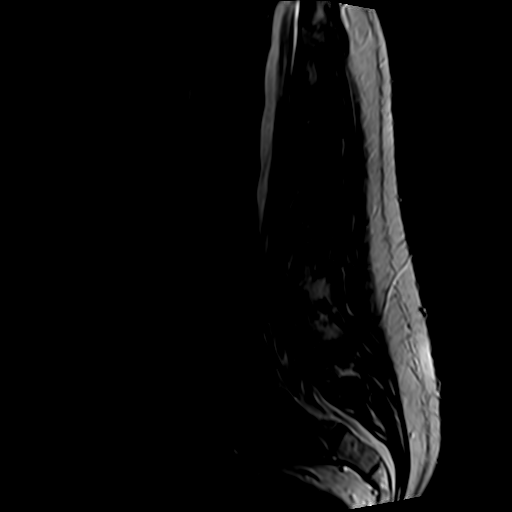
[im 13/16]
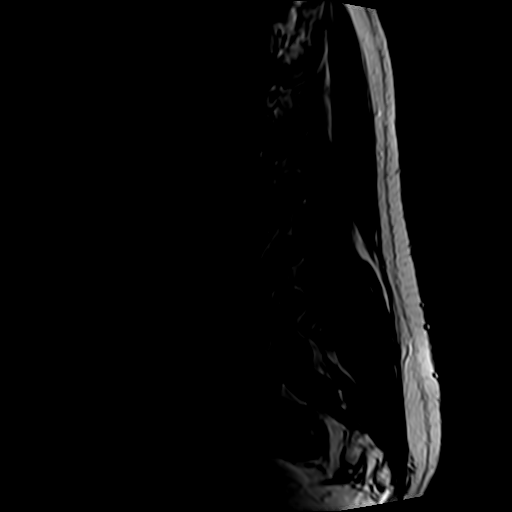
[im 16/16]
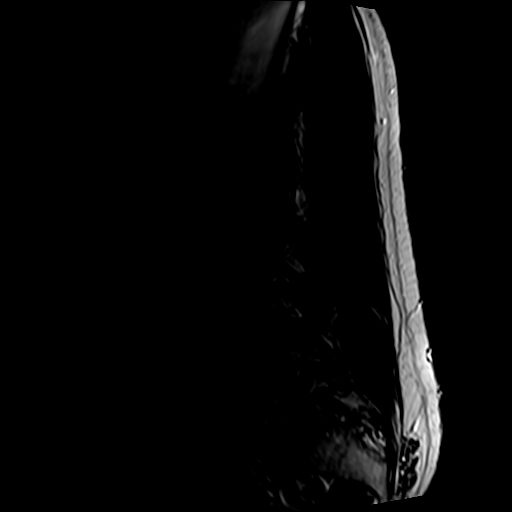

[Series 4: T1 · sagittal · 4.0mm · 0.57mm/px · 6 of 16 slices shown (1 of 2)]
[im 1/16]
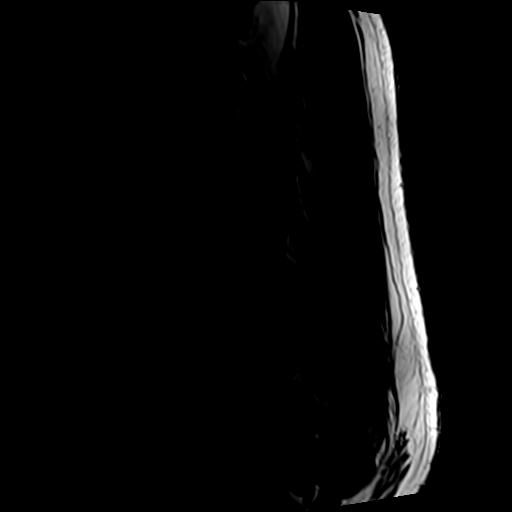
[im 4/16]
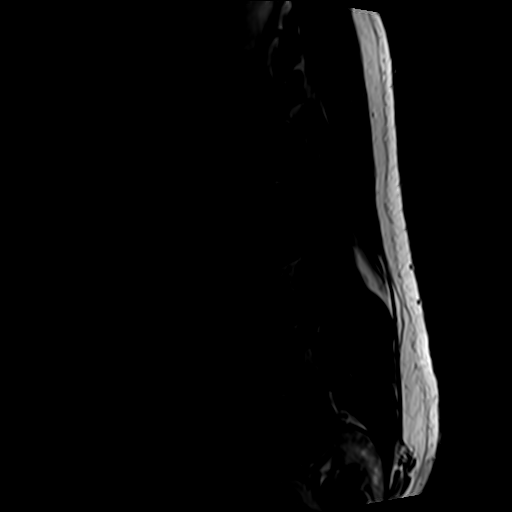
[im 7/16]
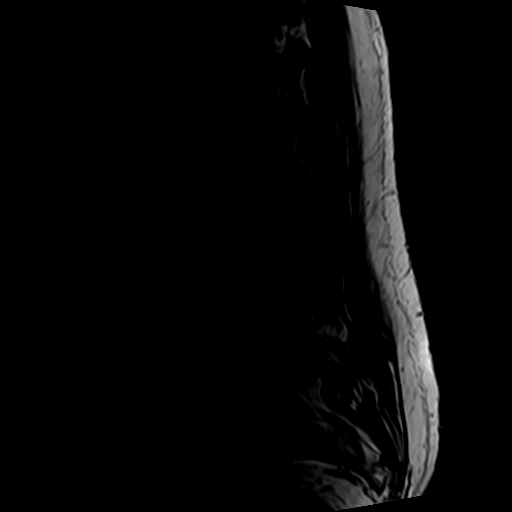
[im 10/16]
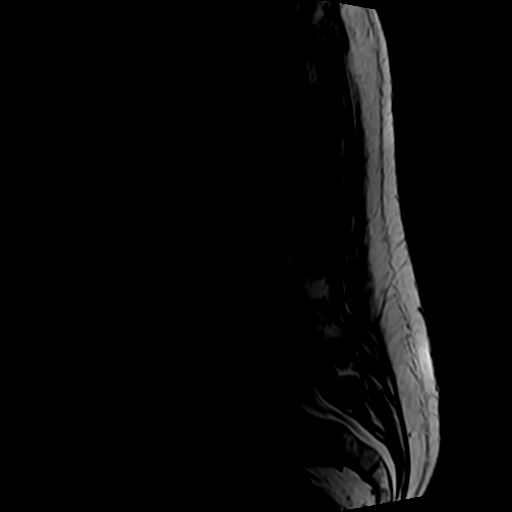
[im 13/16]
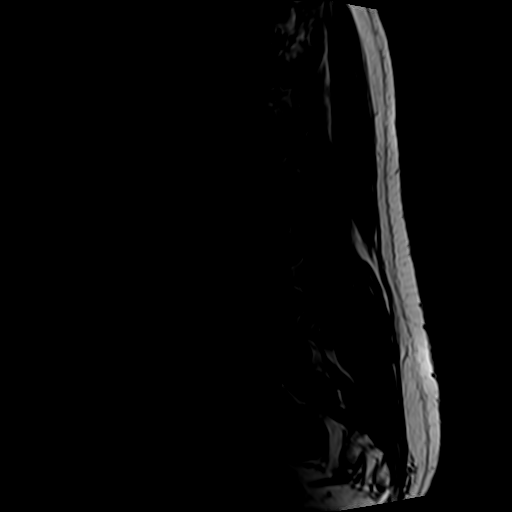
[im 16/16]
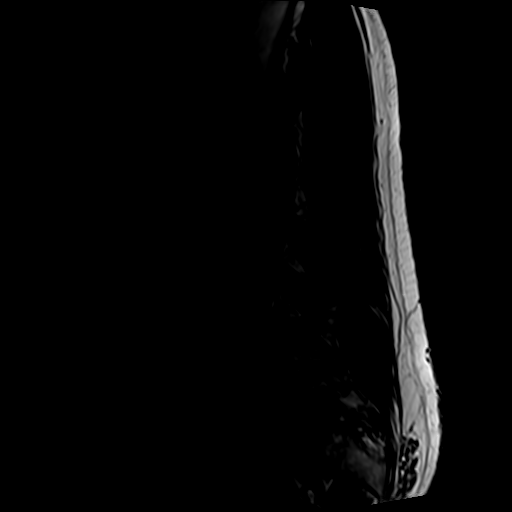

[Series 5: T1 · axial · 4.0mm · 0.35mm/px · z∈[-102,+103]mm · 5 of 42 slices shown (2 of 2)]
[im 1/42]
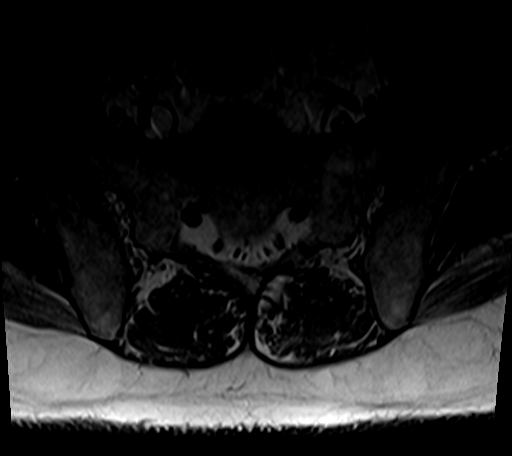
[im 6/42]
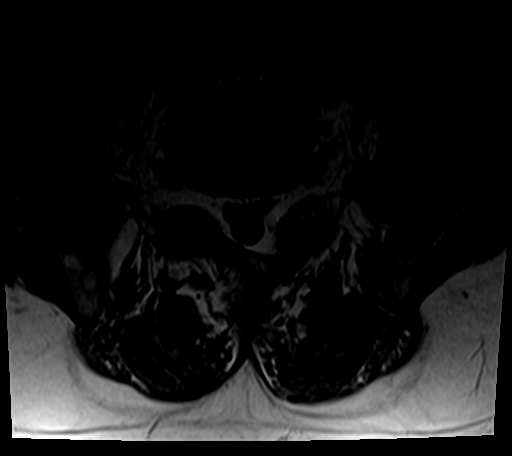
[im 12/42]
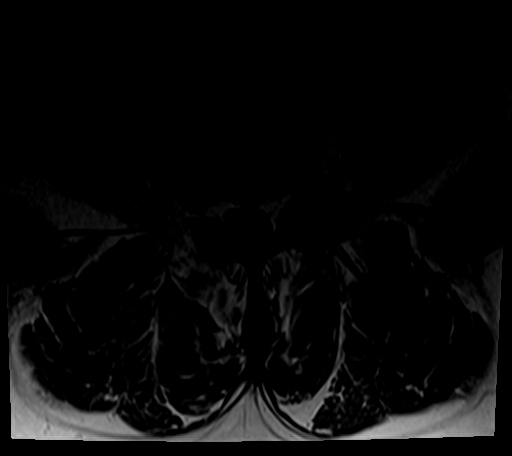
[im 21/42]
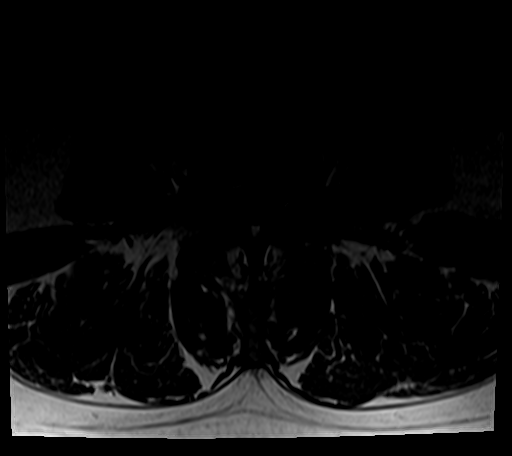
[im 36/42]
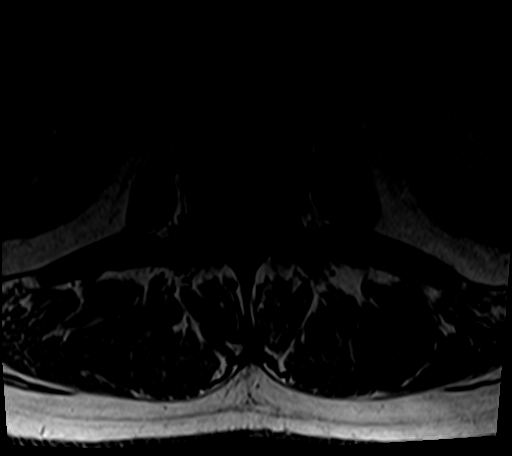

[Series 6: T2 · axial · 4.0mm · 0.70mm/px · z∈[-102,+134]mm · 9 of 42 slices shown (2 of 2)]
[im 1/42]
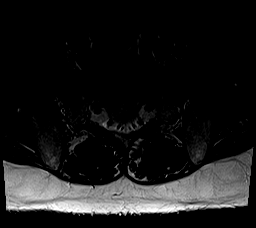
[im 6/42]
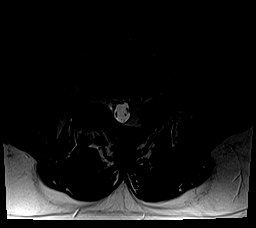
[im 12/42]
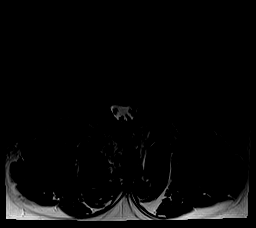
[im 18/42]
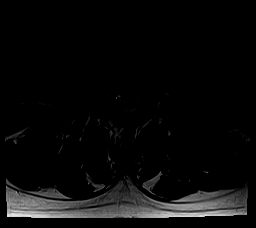
[im 21/42]
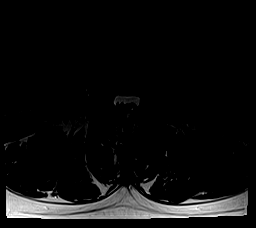
[im 24/42]
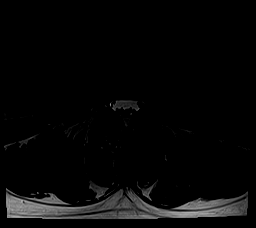
[im 30/42]
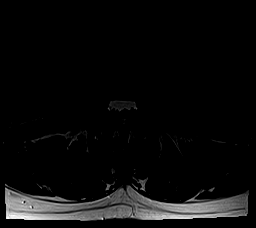
[im 36/42]
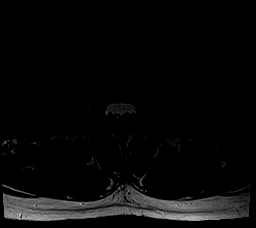
[im 42/42]
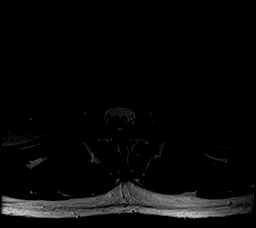

[26 of 48 positions shown; findings below may reference images not displayed]

FINDINGS: Segmentation: There are five lumbar type vertebral bodies. The last
full intervertebral disc space is labeled L5-S1. This correlates
with the prior study.

Alignment:  Normal

Vertebrae: Stable Schmorl's nodes and endplate reactive changes at
L3-4 and L4-5. No bone lesions or fractures.

Conus medullaris and cauda equina: Conus extends to the T12-L1
level. Conus and cauda equina appear normal.

Paraspinal and other soft tissues: No significant paraspinal or
retroperitoneal findings.

Disc levels:

T12-L1: No significant findings.

L1-2: No significant findings. Stable mild facet disease.

L2-3: Mild facet disease but no disc protrusions, spinal or
foraminal stenosis.

L3-4: Stable advanced degenerative disc disease with bulging
degenerated annulus, osteophytic ridging and shallow central disc
protrusion. This demonstrates desiccation and retraction when
compared to the prior study. Stable facet disease. Mild right
foraminal encroachment is stable.

L4-5: Stable appearing broad-based disc protrusion asymmetric left.
Flattening of the ventral thecal sac and probable irritation of the
left L5 nerve root. No foraminal stenosis.

L5-S1: No disc protrusions, spinal or foraminal stenosis. Moderate
facet disease
IMPRESSION: 1. Stable advanced degenerative disc disease at L3-4. Interval
desiccation and retraction of the disc protrusions seen on the prior
study. Mild right foraminal encroachment.
2. Stable appearing broad-based disc protrusion asymmetric left at
L4-5. Flattening of the ventral thecal sac and probable irritation
of the left L5 nerve root.

## 2021-09-13 ENCOUNTER — Encounter: Payer: Self-pay | Admitting: Physician Assistant

## 2021-09-13 ENCOUNTER — Ambulatory Visit (INDEPENDENT_AMBULATORY_CARE_PROVIDER_SITE_OTHER): Payer: Self-pay | Admitting: Physician Assistant

## 2021-09-13 DIAGNOSIS — F5105 Insomnia due to other mental disorder: Secondary | ICD-10-CM

## 2021-09-13 DIAGNOSIS — F250 Schizoaffective disorder, bipolar type: Secondary | ICD-10-CM

## 2021-09-13 DIAGNOSIS — Z79899 Other long term (current) drug therapy: Secondary | ICD-10-CM

## 2021-09-13 DIAGNOSIS — G4733 Obstructive sleep apnea (adult) (pediatric): Secondary | ICD-10-CM

## 2021-09-13 DIAGNOSIS — F17200 Nicotine dependence, unspecified, uncomplicated: Secondary | ICD-10-CM

## 2021-09-13 DIAGNOSIS — I214 Non-ST elevation (NSTEMI) myocardial infarction: Secondary | ICD-10-CM

## 2021-09-13 DIAGNOSIS — F411 Generalized anxiety disorder: Secondary | ICD-10-CM

## 2021-09-13 DIAGNOSIS — E1165 Type 2 diabetes mellitus with hyperglycemia: Secondary | ICD-10-CM

## 2021-09-13 DIAGNOSIS — F99 Mental disorder, not otherwise specified: Secondary | ICD-10-CM

## 2021-09-13 MED ORDER — DULOXETINE HCL 60 MG PO CPEP: 60.0000 mg | ORAL_CAPSULE | Freq: Every day | ORAL | 0 refills | Status: DC

## 2021-09-13 MED ORDER — CLONAZEPAM 1 MG PO TABS: ORAL_TABLET | ORAL | 2 refills | Status: DC

## 2021-09-13 MED ORDER — TRAZODONE HCL 100 MG PO TABS: 300.0000 mg | ORAL_TABLET | Freq: Every day | ORAL | 0 refills | Status: DC

## 2021-09-13 NOTE — Progress Notes (Signed)
[facility] ? ?Patient ID: Matthew [name],  ?MRN: [id number] ? ?PCP: [name], MD ? ?Date of Evaluation: 09/13/2021  ?time spent:30 minutes ? ?Chief Complaint:  ?Chief Complaint   ?Anxiety; Depression; Follow-up ?  ? ? ? ?HISTORY/CURRENT STATUS: ?For routine f/u.  ? ?States he feels really good mentally right now.  He is able to enjoy things.  Energy and motivation are good except for the fact that he has chronic back pain and now dislocated shoulder.  He has started walking 2 miles every day with his dad.  ADLs and personal hygiene are good.  Sleeps well using trazodone.  He does still have some anxiety but not panic attacks, at least in a while.  He does have a sense of being overwhelmed and uneasy at times, partly due to his physical health, status post MI and there is always the concern that something else is wrong.  Does not cry easily.  He is unable to work due to physical and mental health reasons.  No suicidal or homicidal thoughts. ? ?Down to 1 pack of cigarettes per day.  States his cardiologist got onto him really bad at the last visit because his lipids were high as well.  He is trying to get rid of that one risk factor that he has control of.  He is doing a good job. ? ?Patient denies increased energy with decreased need for sleep, no increased talkativeness, no racing thoughts, no impulsivity or risky behaviors, no increased spending, no increased libido, no grandiosity, no increased irritability or anger, no paranoia, and no hallucinations. ? ?States he may have to change psychiatric providers due to insurance reasons.  He mentioned that to me last time, but states he enjoys coming to our office and does not really want to change unless he has to. ? ?Review of Systems  ?Constitutional: Negative.   ?HENT: Negative.    ?Eyes: Negative.   ?Respiratory: Negative.    ?Cardiovascular: Negative.   ?Gastrointestinal: Negative.   ?Genitourinary: Negative.   ?Musculoskeletal:   ?     See HPI   ?Skin: Negative.   ?Neurological: Negative.   ?Endo/Heme/Allergies: Negative.   ?Psychiatric/Behavioral:    ?     See HPI  ? ?Individual Medical History/ Review of Systems: Changes? :Yes    Vascepa was added ? ?Past medications for mental health diagnoses include: ?Risperdal, Zyprexa, Abilify, [name], Klonopin, Gabapentin, Xanax, Valium, Ativan, Hydroxyzine, Trilafon, Atenolol, Cymbalta, Prozac, Lexapro, Paxil, Lithium, Adderall, Ritalin, Mirtazepine, Sonata was ineffective. ? ?Allergies: Gluten meal and Lactose intolerance (gi) ? ?Current Medications:  ?Current Outpatient Medications:  ?  aspirin EC 81 MG tablet, Take 1 tablet (81 mg total) by mouth daily. Swallow whole., Disp: 90 tablet, Rfl: 3 ?  atorvastatin (LIPITOR) 80 MG tablet, Take 1 tablet (80 mg total) by mouth every morning., Disp: 90 tablet, Rfl: 3 ?  carvedilol (COREG) 25 MG tablet, Take 1.5 tablets (37.5 mg total) by mouth 2 (two) times daily., Disp: 270 tablet, Rfl: 3 ?  DULoxetine (CYMBALTA) 30 MG capsule, Take 1 capsule (30 mg total) by mouth daily. Take 1 with the 60 mg=90 mg daily, Disp: 90 capsule, Rfl: 1 ?  gabapentin (NEURONTIN) 800 MG tablet, Take by mouth 3 (three) times daily., Disp: , Rfl:  ?  icosapent Ethyl (VASCEPA) 1 g capsule, Take 2 capsules (2 g total) by mouth 2 (two) times daily., Disp: 120 capsule, Rfl: 2 ?  insulin degludec (TRESIBA FLEXTOUCH) 100 UNIT/ML FlexTouch Pen, Inject 20 Units  into the skin daily., Disp: 15 mL, Rfl: 11 ?  Insulin Pen Needle 32G X 4 MM MISC, 1 Device by Does not apply route daily., Disp: 150 each, Rfl: 3 ?  JARDIANCE 25 MG TABS tablet, TAKE 1 TABLET BY MOUTH DAILY BEFORE BREAKFAST., Disp: 90 tablet, Rfl: 0 ?  lithium carbonate (LITHOBID) 300 MG CR tablet, Take 2 tablets (600 mg total) by mouth 2 (two) times daily. (Patient taking differently: Take 1,200 mg by mouth at bedtime.), Disp: 360 tablet, Rfl: 0 ?  metFORMIN (GLUCOPHAGE) 1000 MG tablet, TAKE 1 TABLET BY MOUTH 2 TIMES DAILY WITH A MEAL., Disp:  60 tablet, Rfl: 0 ?  perphenazine (TRILAFON) 16 MG tablet, TAKE 1 TABLET EVERY MORNING AND 2 TABLETS AT BEDTIME (Patient taking differently: 16 mg 2 (two) times daily.), Disp: 270 tablet, Rfl: 0 ?  Pseudoephedrine HCl (SUDAFED 12 HOUR PO), Take 1 tablet by mouth at bedtime., Disp: , Rfl:  ?  ticagrelor (BRILINTA) 90 MG TABS tablet, Take 1 tablet (90 mg total) by mouth 2 (two) times daily., Disp: 60 tablet, Rfl: 11 ?  Vitamin D, Ergocalciferol, (DRISDOL) 1.25 MG (50000 UNIT) CAPS capsule, Take 50,000 Units by mouth every Sunday., Disp: , Rfl:  ?  clonazePAM (KLONOPIN) 1 MG tablet, TAKE 1 TABLET BY MOUTH THREE TIMES A DAY AS NEEDED FOR ANXIETY, Disp: 90 tablet, Rfl: 2 ?  DULoxetine (CYMBALTA) 60 MG capsule, Take 1 capsule (60 mg total) by mouth daily., Disp: 90 capsule, Rfl: 0 ?  lisinopril (ZESTRIL) 20 MG tablet, Take 0.5 tablets (10 mg total) by mouth daily. (Patient not taking: Reported on 08/09/2021), Disp: 90 tablet, Rfl: 3 ?  nitroGLYCERIN (NITROSTAT) 0.4 MG SL tablet, Place 1 tablet (0.4 mg total) under the tongue every 5 (five) minutes as needed for chest pain., Disp: 100 tablet, Rfl: 3 ?  oxyCODONE-acetaminophen (PERCOCET) 10-325 MG tablet, Take 1 tablet by mouth 5 (five) times daily. (Patient not taking: Reported on 08/09/2021), Disp: , Rfl:  ?  pregabalin (LYRICA) 75 MG capsule, Take 75 mg by mouth 2 (two) times daily. (Patient not taking: Reported on 08/09/2021), Disp: , Rfl:  ?  spironolactone (ALDACTONE) 25 MG tablet, Take 0.5 tablets (12.5 mg total) by mouth daily., Disp: 45 tablet, Rfl: 3 ?  traZODone (DESYREL) 100 MG tablet, Take 3 tablets (300 mg total) by mouth at bedtime., Disp: 270 tablet, Rfl: 0 ?Medication Side Effects: none ? ?Family Medical/ Social History: Changes? no ? ? ?MENTAL HEALTH EXAM: ? ?There were no vitals taken for this visit.There is no height or weight on file to calculate BMI.  ?General Appearance: Casual, Well Groomed, and Obese  ?Eye Contact:  Fair  ?Speech:  Clear and Coherent  and Normal Rate  ?Volume:  Normal  ?Mood:  Euthymic  ?Affect:  Congruent  ?Thought Process:  Goal Directed and Descriptions of Associations: Circumstantial  ?Orientation:  Full (Time, Place, and Person)  ?Thought Content: Logical   ?Suicidal Thoughts:  No  ?Homicidal Thoughts:  No  ?Memory:  WNL  ?Judgement:  Fair  ?Insight:  Fair  ?Psychomotor Activity: Lateral mandibular motion  ?Concentration:  Concentration: Good and Attention Span: Fair  ?Recall:  Good  ?Fund of Knowledge: Good  ?Language: NA  ?Assets:  Desire for Improvement  ?ADL's:  Intact  ?Cognition: WNL  ?Prognosis:  Fair  ? ?Labs pertinent to psychiatric medications are listed below ? ?04/18/2021  ?lithium level 0.4 ? ?04/21/2021  ?BMP sodium 133, glucose 214, calcium 10.8, BUN and creatinine were normal ? ?  04/23/2021 ?BMP sodium 129, glucose 179 calcium 9.6, BUN and creatinine were normal ? ?04/24/2021  ?CMP sodium 127, glucose 190, kidney functions normal, calcium 9.5, ALT 53 otherwise LFTs normal ? ?04/26/2021 ?CMP sodium 129, glucose 223, BUN and creatinine normal, calcium 9.7, ALT 61, otherwise LFTs normal ? ?05/23/2021 ?BMP sodium 125, glucose 260, BUN and creatinine normal, calcium 9.4 ? ?DIAGNOSES:  ?  ICD-10-CM   ?1. Schizoaffective disorder, bipolar type (HCC)  F25.0 Basic metabolic panel  ?  Lithium level  ?  TSH  ?  ?2. Encounter for long-term (current) use of medications  Z79.899 Basic metabolic panel  ?  Lithium level  ?  TSH  ?  ?3. Generalized anxiety disorder  F41.1   ?  ?4. Insomnia due to other mental disorder  F51.05   ? F99   ?  ?5. Smoker  F17.200   ?  ?6. OSA (obstructive sleep apnea)  G47.33   ?  ?7. Non-ST elevation (NSTEMI) myocardial infarction (HCC)  I21.4   ?  ?8. Type 2 diabetes mellitus with hyperglycemia, without long-term current use of insulin (HCC)  E11.65   ?  ? ? ? ?Receiving Psychotherapy: No  ? ? ?RECOMMENDATIONS:  ?PDMP reviewed.  Last Klonopin 08/24/2021. ?I provided 30 minutes of face to face time during this encounter,  including time spent before and after the visit in records review, medical decision making, counseling pertinent to today's visit, and charting.  ?We have discussed TD but he declines Ingrezza or Austedo.

## 2021-09-14 ENCOUNTER — Other Ambulatory Visit: Payer: Self-pay | Admitting: Physician Assistant

## 2021-09-14 ENCOUNTER — Other Ambulatory Visit: Payer: Self-pay | Admitting: Sports Medicine

## 2021-09-14 DIAGNOSIS — M25311 Other instability, right shoulder: Secondary | ICD-10-CM

## 2021-09-15 ENCOUNTER — Ambulatory Visit (INDEPENDENT_AMBULATORY_CARE_PROVIDER_SITE_OTHER): Payer: Medicaid Other | Admitting: Pharmacist

## 2021-09-15 ENCOUNTER — Telehealth: Payer: Self-pay

## 2021-09-15 DIAGNOSIS — E782 Mixed hyperlipidemia: Secondary | ICD-10-CM

## 2021-09-15 DIAGNOSIS — I214 Non-ST elevation (NSTEMI) myocardial infarction: Secondary | ICD-10-CM

## 2021-09-15 NOTE — Progress Notes (Signed)
Patient ID: Matthew [name]                 DOB: 1975/11/04                    MRN: [id number] ? ? ? ? ?HPI: ?Matthew [name] is a 46 y.o. male patient referred to lipid clinic by Dr. Shari ProwsPemberton. PMH is significant for  HTN, HLD, pre-DM, celiac dz, schizoaffective d/o, anxiety, ADHD, OSA not on CPAP, and recent (06/06/20) NSTEMI s/p DES to pLAD and DES to dRCA. Has had numerous subsequent ER visits for chest pain. Repeat cath in 02/2021 with stable disease, patent LAD stent and RCA stent. Patient has struggled with medication compliance.  ? ?Patient presents today to PharmD clinic. He just found out he needs shoulder replacement. He sometimes forgets to take his insulin. He keeps it in the fridge and injects it cold. Did not know that he was supposed to leave at room temperature once he starts using it. He states he is taking all his oral medications. Lives [facility]. He has cut back on smoking. Down to 1 pack per day. Sets an alarm on his phone for the next time he can smoke. Started with 30 min and increases every few days by 15 min. He is now at 1.5hr. Has started walking daily with his dad in the AM. They walk about 2 miles.  ? ?Cut back on his perphenazine because he read that it could increase his cholesterol. His physiatrist knows that he did this. He now hears the spanish radio show and kids laughing in his head, but states its ok. Stopped eating eggs in the AM because he read it has cholesterol.   ? ?Current Medications: atorvastatin 80mg , vascepa 2g twice a day ?Intolerances:  ?Risk Factors: premature CAD, tobacco abuse, DM. HTN ?LDL goal: <55 ? ?Diet: oat bread and eggs ?Triscuits w/ chicken salad ?Can of beans ?V8 juice, diet energy drinks, water ?beets ?stew ? ?Exercise: walks 2 miles in the AM with his dad (pretty fast shape) ? ?Family History: The patient's family history includes Asthma in his mother; Hypertension in his father and mother. ? ?Social History: down to 1 pack per day (sets  timer on his phone to increase the time by 15 min every few days) ? ?Labs: 08/16/21: TC 186, TG 675, HDL 25, LDL-C 60 ? ?Past Medical History:  ?Diagnosis Date  ? Anxiety   ? Celiac disease   ? Coronary artery disease   ? Dairy allergy   ? Depression   ? Diabetes mellitus without complication (HCC)   ? High cholesterol   ? Hypertension   ? Paranoia (HCC)   ? Pre-diabetes   ? Schizoaffective disorder (HCC)   ? ? ?Current Outpatient Medications on File Prior to Visit  ?Medication Sig Dispense Refill  ? aspirin EC 81 MG tablet Take 1 tablet (81 mg total) by mouth daily. Swallow whole. 90 tablet 3  ? atorvastatin (LIPITOR) 80 MG tablet Take 1 tablet (80 mg total) by mouth every morning. 90 tablet 3  ? carvedilol (COREG) 25 MG tablet Take 1.5 tablets (37.5 mg total) by mouth 2 (two) times daily. 270 tablet 3  ? clonazePAM (KLONOPIN) 1 MG tablet TAKE 1 TABLET BY MOUTH THREE TIMES A DAY AS NEEDED FOR ANXIETY 90 tablet 2  ? DULoxetine (CYMBALTA) 30 MG capsule Take 1 capsule (30 mg total) by mouth daily. Take 1 with the 60 mg=90 mg daily 90  capsule 1  ? DULoxetine (CYMBALTA) 60 MG capsule Take 1 capsule (60 mg total) by mouth daily. 90 capsule 0  ? gabapentin (NEURONTIN) 800 MG tablet Take by mouth 3 (three) times daily.    ? icosapent Ethyl (VASCEPA) 1 g capsule Take 2 capsules (2 g total) by mouth 2 (two) times daily. 120 capsule 2  ? insulin degludec (TRESIBA FLEXTOUCH) 100 UNIT/ML FlexTouch Pen Inject 20 Units into the skin daily. 15 mL 11  ? Insulin Pen Needle 32G X 4 MM MISC 1 Device by Does not apply route daily. 150 each 3  ? JARDIANCE 25 MG TABS tablet TAKE 1 TABLET BY MOUTH DAILY BEFORE BREAKFAST. 90 tablet 0  ? lisinopril (ZESTRIL) 20 MG tablet Take 0.5 tablets (10 mg total) by mouth daily. (Patient not taking: Reported on 08/09/2021) 90 tablet 3  ? lithium carbonate (LITHOBID) 300 MG CR tablet Take 2 tablets (600 mg total) by mouth 2 (two) times daily. (Patient taking differently: Take 1,200 mg by mouth at  bedtime.) 360 tablet 0  ? metFORMIN (GLUCOPHAGE) 1000 MG tablet TAKE 1 TABLET BY MOUTH 2 TIMES DAILY WITH A MEAL. 60 tablet 0  ? nitroGLYCERIN (NITROSTAT) 0.4 MG SL tablet Place 1 tablet (0.4 mg total) under the tongue every 5 (five) minutes as needed for chest pain. 100 tablet 3  ? oxyCODONE-acetaminophen (PERCOCET) 10-325 MG tablet Take 1 tablet by mouth 5 (five) times daily. (Patient not taking: Reported on 08/09/2021)    ? perphenazine (TRILAFON) 16 MG tablet TAKE 1 TABLET EVERY MORNING AND 2 TABLETS AT BEDTIME (Patient taking differently: 16 mg 2 (two) times daily.) 270 tablet 0  ? pregabalin (LYRICA) 75 MG capsule Take 75 mg by mouth 2 (two) times daily. (Patient not taking: Reported on 08/09/2021)    ? Pseudoephedrine HCl (SUDAFED 12 HOUR PO) Take 1 tablet by mouth at bedtime.    ? spironolactone (ALDACTONE) 25 MG tablet Take 0.5 tablets (12.5 mg total) by mouth daily. 45 tablet 3  ? ticagrelor (BRILINTA) 90 MG TABS tablet Take 1 tablet (90 mg total) by mouth 2 (two) times daily. 60 tablet 11  ? traZODone (DESYREL) 100 MG tablet Take 3 tablets (300 mg total) by mouth at bedtime. 270 tablet 0  ? Vitamin D, Ergocalciferol, (DRISDOL) 1.25 MG (50000 UNIT) CAPS capsule Take 50,000 Units by mouth every Sunday.    ? ?No current facility-administered medications on file prior to visit.  ? ? ?Allergies  ?Allergen Reactions  ? Gluten Meal Other (See Comments)  ?  Celiac disease  ? Lactose Intolerance (Gi) Diarrhea  ?  Can tolerate hard cheese  ? ? ?Assessment/Plan: ? ?1. Hyperlipidemia - TG are significantly elevated. We discussed that consuming cholesterol does not increase our cholesterol. Ok to eat eggs. We did talk about how sugars and carbs can increase TG. Patient drinks a lot of V8 juice. We discussed how juice, even fruit or vegetable juice contains a large amount of sugars. When we drink juice we consume much more of it than we would if we ate the fruit or veggie and without the fiber that helps decrease blood  sugar spikes. I have asked him to significantly decrease his V8 consumption.  ?Patient was also forgetting to take his insulin because it was in the fridge. I advised patient that he should be storing his open insulin at room temperature. It is good for 56 days at room temperature. Keep open insulin by his other AM medications so he does not forget.  ?  Encouraged patient to continue walking with his dad in the AM. ?Repeat lipid panel with direct LDL and ApoB in 6 weeks. I suspect his LDL-C is actually higher due to the calculation being skewed by his TG. Since patient is diabetic, he would benefit from microvascular complications reduction with fenofibrate. Could consider after labs result. LDL-C goal <55, ApoB goal ideally as close to <60 we can get. May need additional agent. ? ?2. Tobacco Cessation- I congratulated him on his success on cutting back smoking. Encouraged him to continue with his method until he no longer is smoking. ? ? ?Thank you, ? ? ?Olene Floss, Pharm.D, BCPS, CPP ?[facility]  ?[address] [place], [place] [address]  ?Phone: [phone]; Fax: [phone]  ? ? ?

## 2021-09-15 NOTE — Telephone Encounter (Signed)
Evaristo Bury needs a PA  ?

## 2021-09-15 NOTE — Patient Instructions (Addendum)
Decrease the amount of V8 juice you are drinking and energy drinks ? ?Keep your insulin at room temperature once you open the pen. Keep the open pen by your other medications. ? ?Keep up the good work with exercise with your dad and cutting back on smoking. ? ?Continue atorvastatin 80mg , vascepa 2g twice a day ? ?Repeat FASTING labs on 6/15. Lab opens at 7:15 ?

## 2021-09-18 ENCOUNTER — Ambulatory Visit: Payer: Medicaid Other | Admitting: Internal Medicine

## 2021-09-18 ENCOUNTER — Encounter: Payer: Self-pay | Admitting: Internal Medicine

## 2021-09-18 VITALS — BP 130/82 | HR 82 | Wt 261.0 lb

## 2021-09-18 DIAGNOSIS — E1165 Type 2 diabetes mellitus with hyperglycemia: Secondary | ICD-10-CM | POA: Diagnosis not present

## 2021-09-18 DIAGNOSIS — R739 Hyperglycemia, unspecified: Secondary | ICD-10-CM

## 2021-09-18 DIAGNOSIS — E782 Mixed hyperlipidemia: Secondary | ICD-10-CM

## 2021-09-18 LAB — POCT GLYCOSYLATED HEMOGLOBIN (HGB A1C): Hemoglobin A1C: 8.1 % — AB (ref 4.0–5.6)

## 2021-09-18 LAB — POCT GLUCOSE (DEVICE FOR HOME USE): Glucose Fasting, POC: 140 mg/dL — AB (ref 70–99)

## 2021-09-18 MED ORDER — METFORMIN HCL 1000 MG PO TABS: 1000.0000 mg | ORAL_TABLET | Freq: Two times a day (BID) | ORAL | 3 refills | Status: DC

## 2021-09-18 MED ORDER — TRESIBA FLEXTOUCH 100 UNIT/ML ~~LOC~~ SOPN: 20.0000 [IU] | PEN_INJECTOR | Freq: Every day | SUBCUTANEOUS | 3 refills | Status: DC

## 2021-09-18 MED ORDER — EMPAGLIFLOZIN 25 MG PO TABS: 25.0000 mg | ORAL_TABLET | Freq: Every day | ORAL | 3 refills | Status: DC

## 2021-09-18 NOTE — Progress Notes (Signed)
?Name: Matthew [name]  ?Age/ Sex: 46 y.o., male   ?MRN/ DOB: [id number], 04-Jan-1976    ? ?PCP: [name], MD   ?Reason for Endocrinology Evaluation: Type 2 Diabetes Mellitus  ?Initial Endocrine Consultative Visit: 08/15/2020  ? ? ?PATIENT IDENTIFIER: Matthew [name] is a 46 y.o. male with a past medical history of T2DN, CAD , OSA and schizoaffective d/o. The patient has followed with Endocrinology clinic since 08/15/2020 for consultative assistance with management of his diabetes. ? ?DIABETIC HISTORY:  ?Matthew [name] was diagnosed with DM 2021,he has been on metformin and jardiance . His hemoglobin A1c has ranged from 6.4% in 2020, peaking at 12.8% in 2022. ? ?Lives alone  ?  ?Pt on disability for mental health  ?SUBJECTIVE:  ? ?During the last visit (11/16/2020): A1c 12.0 % , we continued metformin, Jardiance and  started insulin  ? ?Today (09/18/2021): Matthew [name] is here for a diabetes management.  He has not been to our clinic in 11 months.  He checks his blood sugars 1 times daily. ? ? ?Denies nausea, vomiting or diarrhea  ? ? ? ?HOME DIABETES REGIMEN:  ?Metformin 1000 mg , 1 tablet before Breakfast and 1 tablet before Supper ?Jardiance 25 mg, 1 tablet before Breakfast ?Basaglar 20 units daily  ? ? ? ? ?Statin: yes ?ACE-I/ARB: yes ? ? ?METER DOWNLOAD SUMMARY: Does not check  ? ? ?DIABETIC COMPLICATIONS: ?Microvascular complications:  ? ?Denies: CKD, retinopathy, neuropathy  ?Last Eye Exam: Completed 2022 ? ?Macrovascular complications:  ?CAD ?Denies: CVA, PVD ? ? ?HISTORY:  ?Past Medical History:  ?Past Medical History:  ?Diagnosis Date  ? Anxiety   ? Celiac disease   ? Coronary artery disease   ? Dairy allergy   ? Depression   ? Diabetes mellitus without complication ([place])   ? High cholesterol   ? Hypertension   ? Paranoia ([place])   ? Pre-diabetes   ? Schizoaffective disorder ([place])   ? ?Past Surgical History:  ?Past Surgical History:  ?Procedure Laterality Date  ? APPENDECTOMY    ? BRAIN SURGERY    ? CARDIAC  CATHETERIZATION    ? CORONARY STENT INTERVENTION N/A 06/06/2020  ? Procedure: CORONARY STENT INTERVENTION;  Surgeon: Martinique, Peter M, MD;  Location: [facility];  Service: Cardiovascular;  Laterality: N/A;  prox LAD, distal RCA  ? INTRAVASCULAR ULTRASOUND/IVUS N/A 06/06/2020  ? Procedure: Intravascular Ultrasound/IVUS;  Surgeon: Martinique, Peter M, MD;  Location: [facility];  Service: Cardiovascular;  Laterality: N/A;  ? LEFT HEART CATH AND CORONARY ANGIOGRAPHY N/A 06/06/2020  ? Procedure: LEFT HEART CATH AND CORONARY ANGIOGRAPHY;  Surgeon: Martinique, Peter M, MD;  Location: [facility];  Service: Cardiovascular;  Laterality: N/A;  ? LEFT HEART CATH AND CORONARY ANGIOGRAPHY N/A 02/24/2021  ? Procedure: LEFT HEART CATH AND CORONARY ANGIOGRAPHY;  Surgeon: Belva Crome, MD;  Location: [facility];  Service: Cardiovascular;  Laterality: N/A;  ? NASAL SEPTUM SURGERY    ? WRIST FRACTURE SURGERY    ? right wrist  ? ?Social History:  reports that he has been smoking cigarettes and e-cigarettes. He has been smoking an average of 1 pack per day. He has never used smokeless tobacco. He reports that he does not drink alcohol and does not use drugs. ?Family History:  ?Family History  ?Problem Relation Age of Onset  ? Asthma Mother   ? Hypertension Mother   ? Hypertension Father   ? ? ? ?HOME MEDICATIONS: ?Allergies as  of 09/18/2021   ? ?   Reactions  ? Gluten Meal Other (See Comments)  ? Celiac disease  ? Lactose Intolerance (gi) Diarrhea  ? Can tolerate hard cheese  ? ?  ? ?  ?Medication List  ?  ? ?  ? Accurate as of Sep 18, 2021  7:16 AM. If you have any questions, ask your nurse or doctor.  ?  ?  ? ?  ? ?aspirin EC 81 MG tablet ?Take 1 tablet (81 mg total) by mouth daily. Swallow whole. ?  ?atorvastatin 80 MG tablet ?Commonly known as: LIPITOR ?Take 1 tablet (80 mg total) by mouth every morning. ?  ?carvedilol 25 MG tablet ?Commonly known as: COREG ?Take 1.5 tablets (37.5 mg total) by mouth 2 (two) times  daily. ?  ?clonazePAM 1 MG tablet ?Commonly known as: KLONOPIN ?TAKE 1 TABLET BY MOUTH THREE TIMES A DAY AS NEEDED FOR ANXIETY ?  ?DULoxetine 30 MG capsule ?Commonly known as: Cymbalta ?Take 1 capsule (30 mg total) by mouth daily. Take 1 with the 60 mg=90 mg daily ?  ?DULoxetine 60 MG capsule ?Commonly known as: CYMBALTA ?Take 1 capsule (60 mg total) by mouth daily. ?  ?gabapentin 800 MG tablet ?Commonly known as: NEURONTIN ?Take by mouth 3 (three) times daily. ?  ?icosapent Ethyl 1 g capsule ?Commonly known as: VASCEPA ?Take 2 capsules (2 g total) by mouth 2 (two) times daily. ?  ?Insulin Pen Needle 32G X 4 MM Misc ?1 Device by Does not apply route daily. ?  ?Jardiance 25 MG Tabs tablet ?Generic drug: empagliflozin ?TAKE 1 TABLET BY MOUTH DAILY BEFORE BREAKFAST. ?  ?lithium carbonate 300 MG CR tablet ?Commonly known as: LITHOBID ?Take 2 tablets (600 mg total) by mouth 2 (two) times daily. ?What changed:  ?how much to take ?when to take this ?  ?metFORMIN 1000 MG tablet ?Commonly known as: GLUCOPHAGE ?TAKE 1 TABLET BY MOUTH 2 TIMES DAILY WITH A MEAL. ?  ?nitroGLYCERIN 0.4 MG SL tablet ?Commonly known as: Nitrostat ?Place 1 tablet (0.4 mg total) under the tongue every 5 (five) minutes as needed for chest pain. ?  ?perphenazine 16 MG tablet ?Commonly known as: TRILAFON ?TAKE 1 TABLET EVERY MORNING AND 2 TABLETS AT BEDTIME ?What changed:  ?how much to take ?when to take this ?additional instructions ?  ?pregabalin 75 MG capsule ?Commonly known as: LYRICA ?Take 75 mg by mouth 2 (two) times daily. ?  ?spironolactone 25 MG tablet ?Commonly known as: ALDACTONE ?Take 0.5 tablets (12.5 mg total) by mouth daily. ?  ?SUDAFED 12 HOUR PO ?Take 1 tablet by mouth at bedtime. ?  ?ticagrelor 90 MG Tabs tablet ?Commonly known as: BRILINTA ?Take 1 tablet (90 mg total) by mouth 2 (two) times daily. ?  ?traZODone 100 MG tablet ?Commonly known as: DESYREL ?Take 3 tablets (300 mg total) by mouth at bedtime. ?  ?Evaristo Bury FlexTouch 100  UNIT/ML FlexTouch Pen ?Generic drug: insulin degludec ?Inject 20 Units into the skin daily. ?  ?Vitamin D (Ergocalciferol) 1.25 MG (50000 UNIT) Caps capsule ?Commonly known as: DRISDOL ?Take 50,000 Units by mouth every Sunday. ?  ? ?  ? ? ? ?OBJECTIVE:  ? ?Vital Signs: There were no vitals taken for this visit.  ?Wt Readings from Last 3 Encounters:  ?08/09/21 265 lb 3.2 oz (120.3 kg)  ?07/04/21 250 lb (113.4 kg)  ?05/31/21 259 lb 12.8 oz (117.8 kg)  ? ? ? ?Exam: ?General: Pt appears well and is in NAD  ?Lungs: Clear  with good BS bilat with no rales, rhonchi, or wheezes  ?Heart: RRR with normal S1 and S2 and no gallops; no murmurs; no rub  ?Abdomen: Normoactive bowel sounds, soft, nontender, without masses or organomegaly palpable  ?Extremities: No pretibial edema.   ?Neuro: MS is good with appropriate affect, pt is alert and Ox3  ? ? ?DM foot exam: 09/18/2021 ?  ?The skin of the feet is without sores or ulcerations, callous formation at the heels ?The pedal pulses are undetectable  ?The sensation is intact to a screening 5.07, 10 gram monofilament bilaterally ?   ? ? ? ?DATA REVIEWED: ? ?Lab Results  ?Component Value Date  ? HGBA1C 9.2 (A) 11/16/2020  ? HGBA1C 12.3 (A) 08/15/2020  ? HGBA1C 12.8 (H) 06/06/2020  ? ?Lab Results  ?Component Value Date  ? [place] 60 08/16/2021  ? CREATININE 0.94 05/23/2021  ? ? ?     ?Results for Matthew [name], Matthew [name] (MRN [id number]) as of 11/16/2020 14:45 ? Ref. Range 06/06/2020 20:32 08/15/2020 15:26  ?Total CHOL/HDL Ratio Unknown NOT REPORTED DUE TO HIGH TRIGLYCERIDES 6  ?Cholesterol Latest Ref Range: 0 - 200 mg/dL 717 (H) 133  ?HDL Cholesterol Latest Ref Range: >39.00 mg/dL NOT REPORTED DUE TO HIGH TRIGLYCERIDES 23.50 (L)  ?LDL (calc) Latest Ref Range: 0 - 99 mg/dL UNABLE TO CALCULATE IF TRIGLYCERIDE OVER 400 mg/dL   ?Direct LDL Latest Units: mg/dL 51.0 68.0  ?NonHDL Unknown  109.53  ?Triglycerides Latest Ref Range: 0.0 - 149.0 mg/dL 2,311 (H) 377.0 (H)  ?VLDL Latest Ref Range: 0.0 - 40.0  mg/dL UNABLE TO CALCULATE IF TRIGLYCERIDE OVER 400 mg/dL 75.4 (H)  ? ? ?ASSESSMENT / PLAN / RECOMMENDATIONS:  ? ?1) Type 2 Diabetes Mellitus, Sub-Optimally controlled, Without complications - Most recent A1c of 8.1 %.

## 2021-09-18 NOTE — Patient Instructions (Addendum)
-   Continue Metformin 1000 mg , 1 tablet before Breakfast and 1 tablet before Supper  ?- Continue  Jardiance 25 mg, 1 tablet before Breakfast  ?- Continue Tresiba  20  units once daily  ? ? ? ? ?HOW TO TREAT LOW BLOOD SUGARS (Blood sugar LESS THAN 70 MG/DL) ?Please follow the RULE OF 15 for the treatment of hypoglycemia treatment (when your (blood sugars are less than 70 mg/dL)  ? ?STEP 1: Take 15 grams of carbohydrates when your blood sugar is low, which includes:  ?3-4 GLUCOSE TABS  OR ?3-4 OZ OF JUICE OR REGULAR SODA OR ?ONE TUBE OF GLUCOSE GEL   ? ?STEP 2: RECHECK blood sugar in 15 MINUTES ?STEP 3: If your blood sugar is still low at the 15 minute recheck --> then, go back to STEP 1 and treat AGAIN with another 15 grams of carbohydrates. ? ?

## 2021-09-19 ENCOUNTER — Other Ambulatory Visit (HOSPITAL_COMMUNITY): Payer: Self-pay

## 2021-09-19 ENCOUNTER — Telehealth: Payer: Self-pay

## 2021-09-19 MED ORDER — LANTUS SOLOSTAR 100 UNIT/ML ~~LOC~~ SOPN: 20.0000 [IU] | PEN_INJECTOR | Freq: Every day | SUBCUTANEOUS | 3 refills | Status: DC

## 2021-09-19 NOTE — Telephone Encounter (Signed)
I do not see an order for this medication, and I read chart notes that pt request change to different long acting. PA no longer needed?

## 2021-09-19 NOTE — Telephone Encounter (Signed)
Patient notified

## 2021-09-19 NOTE — Telephone Encounter (Signed)
Patient states that the Matthew [name] is  still being denied. I did send a message to the pa team to process authorization but he states that we would change to a different long acting.  ?

## 2021-09-21 ENCOUNTER — Telehealth: Payer: Self-pay

## 2021-09-21 NOTE — Telephone Encounter (Signed)
Okay to hold brilinta for the procedure. Last PCI 05/2020. ?

## 2021-09-21 NOTE — Telephone Encounter (Signed)
? ?  Patient Name: Matthew [name]  ?DOB: December 14, 1975 ?MRN: [id number] ? ?Primary Cardiologist: Freada Bergeron, MD ? ?Chart reviewed as part of pre-operative protocol coverage.  ? ?Dr. Johney Frame, we have been asked to hold Brilinta for 5 days prior to Lumbar Spine-transforaminal Right L3-L4, L4-L5.  [name] is a 46 year old with PMH significant of HTN, HLD, pre-DM, celiac dz, schizoaffective d/o, anxiety, ADHD, OSA not on CPAP, and recent (06/06/20) NSTEMI s/p DES to pLAD and DES to dRCA. Has had numerous subsequent ER visits for chest pain. Repeat cath in 02/2021 with stable disease, patent LAD stent and RCA stent.  He was last seen in office by you on 08/09/2021.  May he hold his Brilinta for 5 days prior?  Please route reply to the preop pool at [facility] ? ?Thank you, ? ?Mable Fill, Marissa Nestle, NP ?09/21/2021, 8:14 AM ?  ?

## 2021-09-21 NOTE — Telephone Encounter (Signed)
? ? ?  Patient Name: Matthew [name]  ?DOB: 1975/06/05 ?MRN: [id number] ? ?Primary Cardiologist: Freada Bergeron, MD ? ?Chart reviewed as part of pre-operative protocol coverage. Given past medical history and time since last visit, based on ACC/AHA guidelines, JALIEL WINKEL would be at acceptable risk for the planned procedure without further cardiovascular testing.  ? ?Patient okay to hold Brilinta for 5 days per cardiology ? ?I will route this recommendation to the requesting party via Epic fax function and remove from pre-op pool. ? ?Please call with questions. ? ?Jimmy Picket, NP ?09/21/2021, 4:05 PM  ?

## 2021-09-21 NOTE — Telephone Encounter (Signed)
? ?  Pre-operative Risk Assessment  ?  ?Patient Name: Matthew [name]  ?DOB: 21-Aug-1975 ?MRN: [id number]  ? ?  ? ?Request for Surgical Clearance   ? ?Procedure:   Lumbar Spine-transforaminal Right L3-L4, L4-L5 ? ?Date of Surgery:  Clearance TBD                              ?   ?Surgeon:  Dr Aileen Fass ?Surgeon's Group or Practice Name:  [facility] ?Phone number:  [phone] ext [phone] ?Fax number:  [phone] ?  ?Type of Clearance Requested:   ?- Pharmacy:  Hold Ticagrelor (Brilinta) 5 days prior  ?  ?Type of Anesthesia:   None listed ?  ?Additional requests/questions:   n/a ? ?Signed, ?Kandice Robinsons T   ?09/21/2021, 7:33 AM  ? ?

## 2021-09-26 ENCOUNTER — Ambulatory Visit
Admission: RE | Admit: 2021-09-26 | Discharge: 2021-09-26 | Disposition: A | Discharge status: Home / Self Care | Payer: Medicaid Other | Source: Ambulatory Visit | Attending: Sports Medicine | Admitting: Sports Medicine

## 2021-09-26 DIAGNOSIS — M25311 Other instability, right shoulder: Secondary | ICD-10-CM

## 2021-09-26 IMAGING — MR MR SHOULDER*R* W/CM
5 series · 40 of 40 positions shown · IV contrast (agent unspecified)
Comparison: Injection images same date.  Radiographs [DATE].

CLINICAL DATA: Right shoulder instability with frequent
dislocations. Most recent dislocation 5 days prior. No previous
relevant surgery.

EXAM:
MR ARTHROGRAM OF THE RIGHT SHOULDER
TECHNIQUE: Multiplanar, multisequence MR imaging of the right shoulder was
performed following the administration of intra-articular contrast.
CONTRAST:  See Injection Documentation.

[Series 3: T1 fat-sat · axial · 4.0mm · 0.27mm/px · z∈[-57,+41]mm · 7 of 21 slices shown (1 of 3)]
[im 1/21]
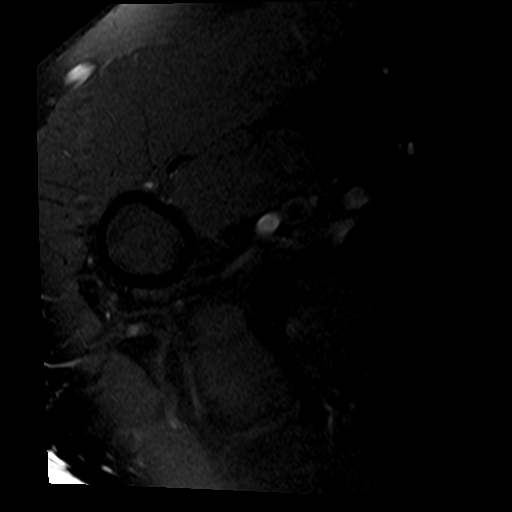
[im 4/21]
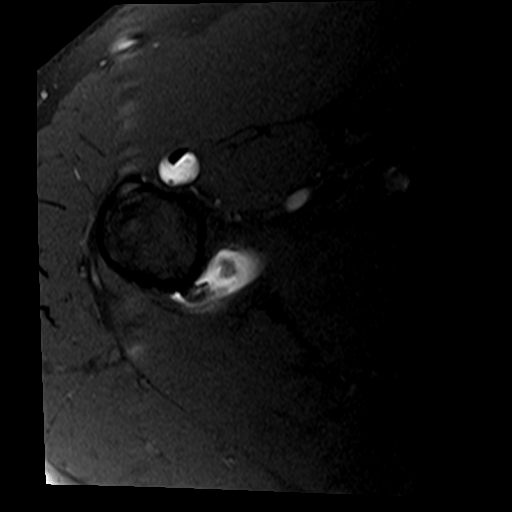
[im 7/21]
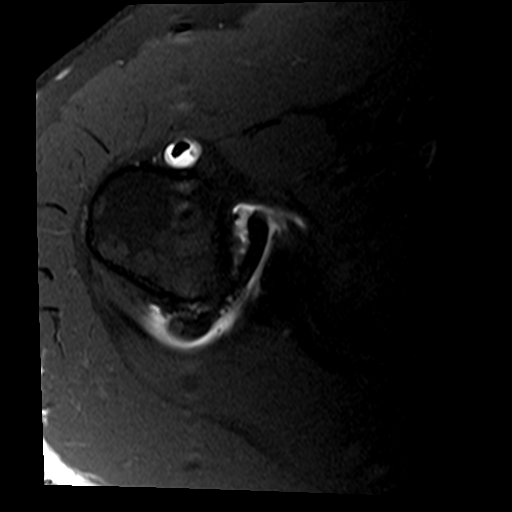
[im 11/21]
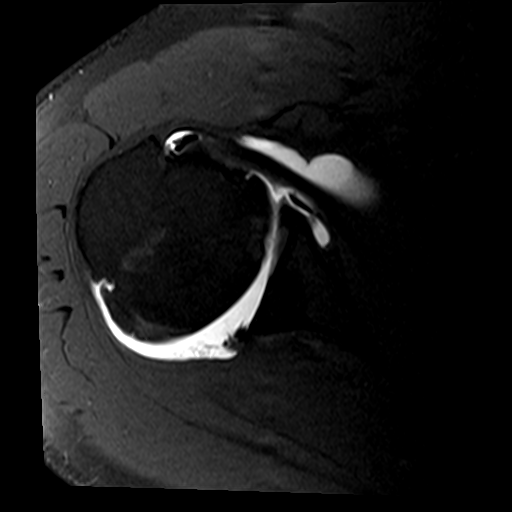
[im 14/21]
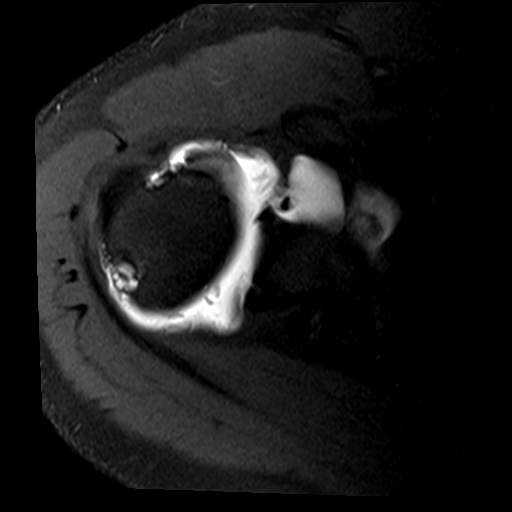
[im 17/21]
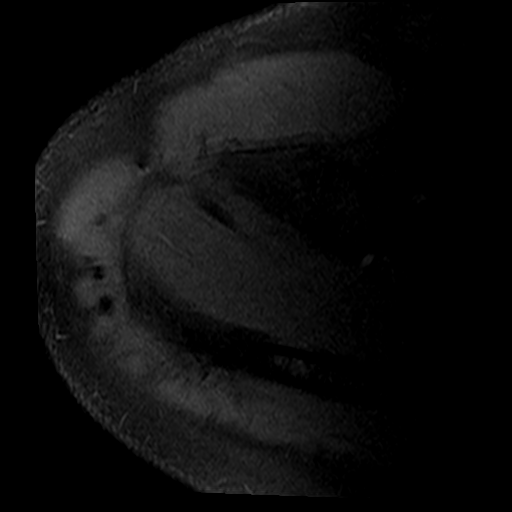
[im 21/21]
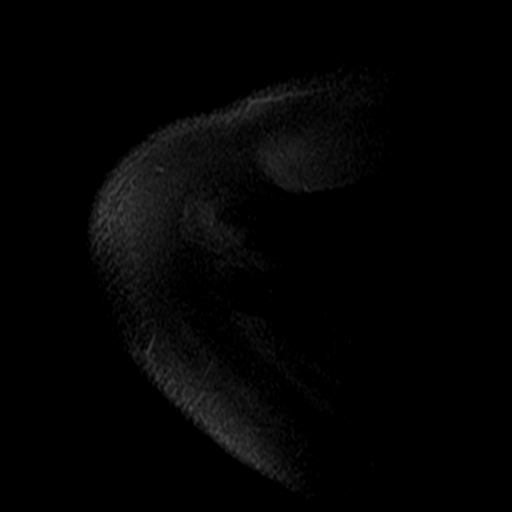

[Series 4: T2 fat-sat · sagittal · 4.0mm · 0.55mm/px · 9 of 22 slices shown (1 of 2)]
[im 1/22]
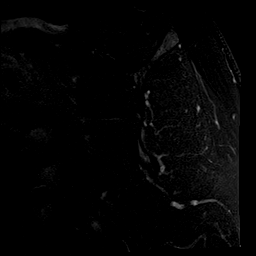
[im 3/22]
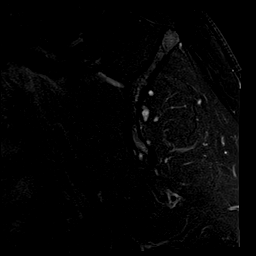
[im 6/22]
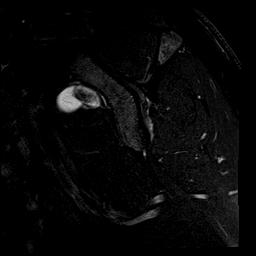
[im 8/22]
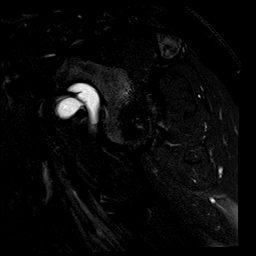
[im 11/22]
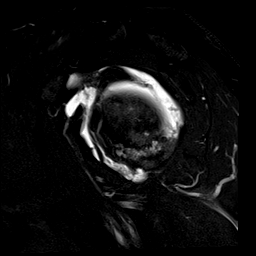
[im 14/22]
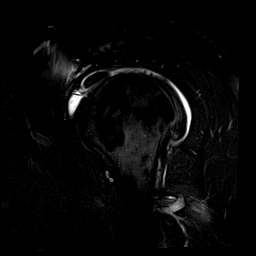
[im 16/22]
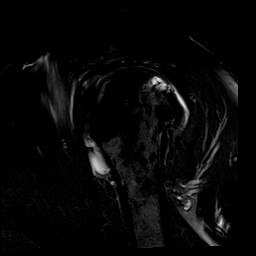
[im 19/22]
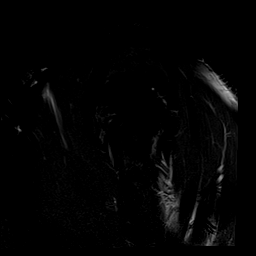
[im 22/22]
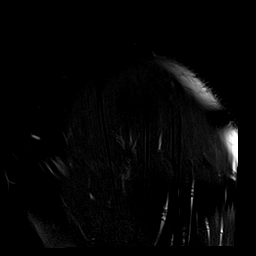

[Series 5: T1 fat-sat · coronal · 4.0mm · 0.55mm/px · 8 of 20 slices shown (2 of 3)]
[im 1/20]
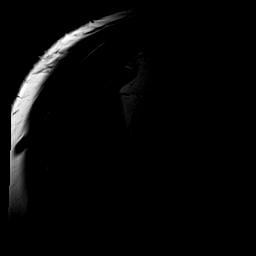
[im 3/20]
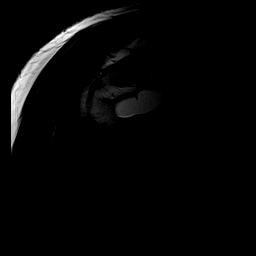
[im 6/20]
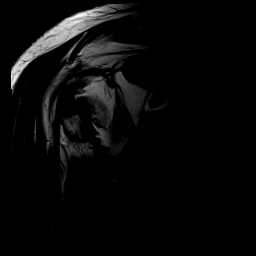
[im 9/20]
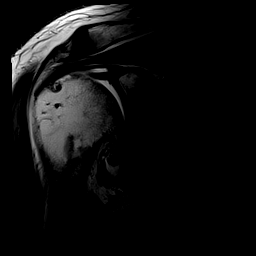
[im 11/20]
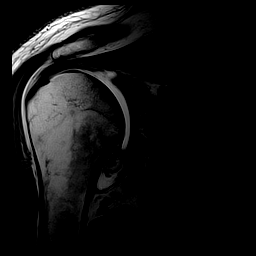
[im 14/20]
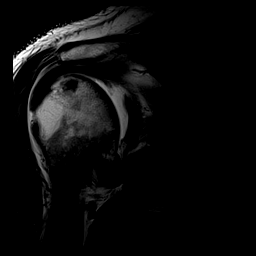
[im 17/20]
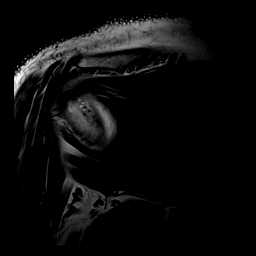
[im 20/20]
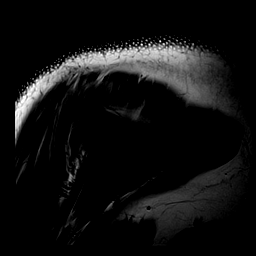

[Series 6: T1 fat-sat · coronal · 4.0mm · 0.55mm/px · 8 of 20 slices shown (3 of 3)]
[im 1/20]
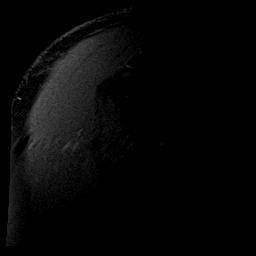
[im 3/20]
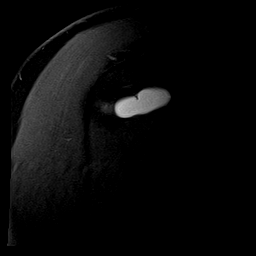
[im 6/20]
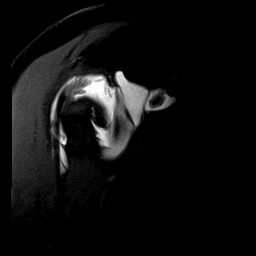
[im 9/20]
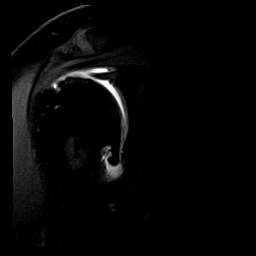
[im 11/20]
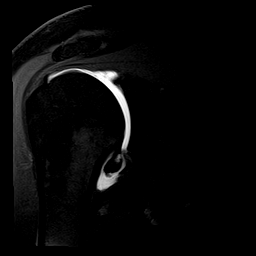
[im 14/20]
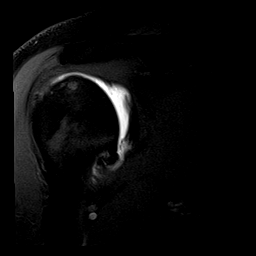
[im 17/20]
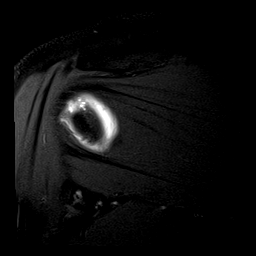
[im 20/20]
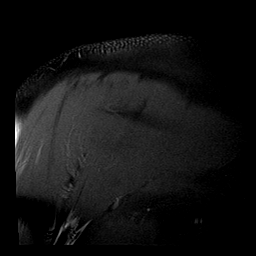

[Series 7: T2 fat-sat · coronal · 4.0mm · 0.55mm/px · 8 of 20 slices shown (2 of 2)]
[im 1/20]
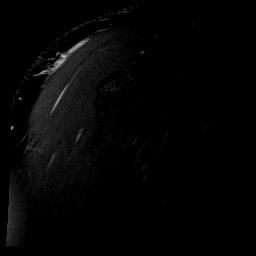
[im 3/20]
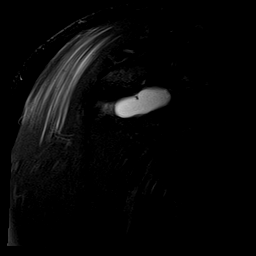
[im 6/20]
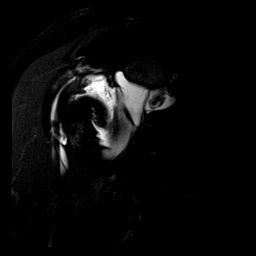
[im 9/20]
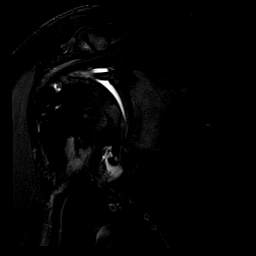
[im 11/20]
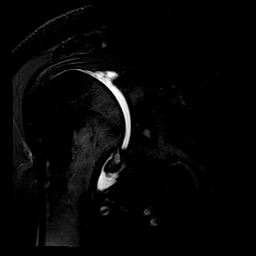
[im 14/20]
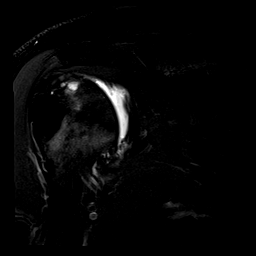
[im 17/20]
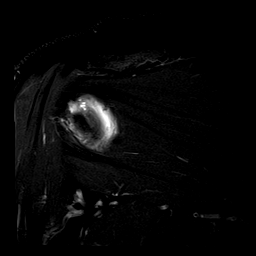
[im 20/20]
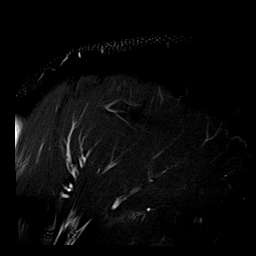

[40 of 40 positions shown; findings below may reference images not displayed]

FINDINGS: Rotator cuff: Mild infraspinatus tendinosis without tear. The
subscapularis, supraspinatus and infraspinatus tendons appear
normal.

Muscles:  No focal muscular atrophy or edema.

Biceps long head:  Intact and normally positioned.

Acromioclavicular Joint: The acromion is type 2. There are mild
acromioclavicular degenerative changes. No significant fluid is
present in the subacromial - subdeltoid bursa.

Glenohumeral Joint: Age advanced glenohumeral degenerative changes
with diffuse chondral thinning and prominent osteophytes of the
humeral head inferiorly. The shoulder joint is well distended with
contrast. Probable focal synovial thickening in the superior
subscapularis recess. No definite loose bodies.

Labrum: Diffuse labral degeneration. The posterior labrum is
degenerated and irregular. The anterior inferior labrum appears
intact.

Bones: Prominent subcortical cyst formation posteriorly in the
humeral head near the infraspinatus insertion. The humeral head is
located. No Hill-Sachs or osseous Bankart lesion.

Other: Sequela of the injection in the anterior deltoid muscle. No
focal periarticular fluid collection.
IMPRESSION: 1. Age advanced glenohumeral degenerative changes with diffuse
chondral thinning and prominent osteophytes of the humeral head
inferiorly.
2. No specific signs of recent glenohumeral dislocation. There is no
evidence of Hill-Sachs deformity or anterior labroligamentous
injury. The labrum is diffusely degenerated, especially posteriorly.
3. Intact rotator cuff with mild infraspinatus tendinosis.

## 2021-09-26 IMAGING — XA DG FLUORO GUIDE NDL PLC/BX
1 series · 1 of 1 positions shown · non-contrast
Comparison: none

CLINICAL DATA: Chronic right shoulder pain.  No prior surgery.

[Series 1: ortho standard · 1 of 1 slices shown]
[im 1/1]
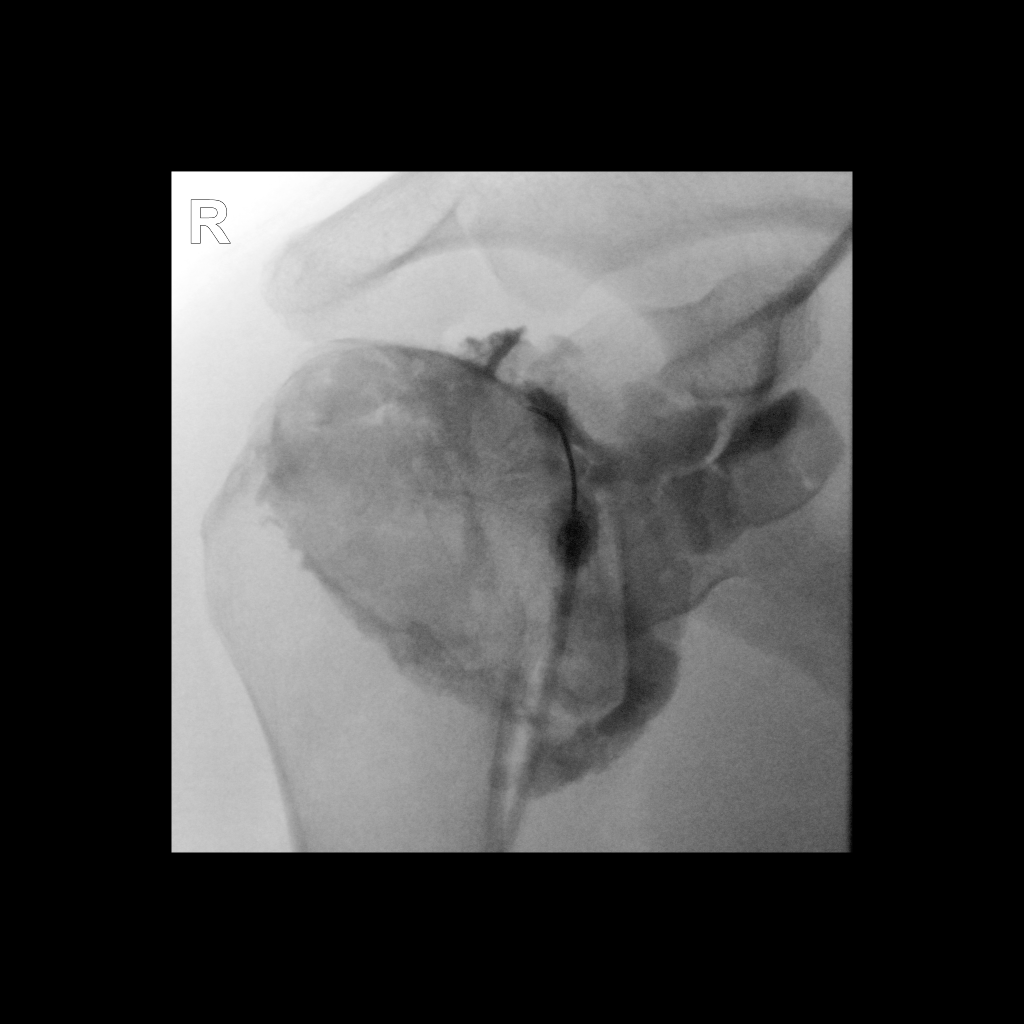

[1 of 1 positions shown; findings below may reference images not displayed]

FLUOROSCOPY TIME:  Radiation Exposure Index (as provided by the
fluoroscopic device): 1.1 mGy Kerma

PROCEDURE:
The risks and benefits of the procedure were discussed with the
patient, and written informed consent was obtained. The patient
stated no history of allergy to contrast media. A formal timeout
procedure was performed with the patient according to departmental
protocol.

The patient was placed supine on the fluoroscopy table and the right
glenohumeral joint was identified under fluoroscopy. The skin
overlying the right glenohumeral joint was subsequently cleaned with
Betadine and a sterile drape was placed over the area of interest. 2
ml 1% Lidocaine was used to anesthetize the skin around the needle
insertion site.

A 22 gauge spinal needle was inserted into the right glenohumeral
joint under fluoroscopy.

12 ml of gadolinium mixture (0.1 ml of Multihance mixed with 10 ml
of Isovue-M 300 contrast and 10 ml of sterile saline) were injected
into the right glenohumeral joint.

The needle was removed and hemostasis was achieved. The patient was
subsequently transferred to MRI for imaging.
IMPRESSION: Technically successful right shoulder injection for MRI.

## 2021-09-26 MED ORDER — IOPAMIDOL (ISOVUE-M 200) INJECTION 41%
12.0000 mL | Freq: Once | INTRAMUSCULAR | Status: AC
  Administered 2021-09-26: 12 mL via INTRA_ARTICULAR

## 2021-09-29 ENCOUNTER — Telehealth: Payer: Self-pay | Admitting: *Deleted

## 2021-09-29 NOTE — Telephone Encounter (Signed)
? ?  Pre-operative Risk Assessment  ?  ?Patient Name: Matthew [name]  ?DOB: Dec 07, 1975 ?MRN: [id number]  ? ?  ? ?Request for Surgical Clearance   ? ?Procedure:   RIGHT TOTAL SHOULDER ARTHROPLASTY ? ?Date of Surgery:  Clearance TBD                              ?   ?Surgeon:  DR. Marchia Bond ?Surgeon's Group or Practice Name:  [facility] ?Phone number:  [phone] [phone] ATTN: Derek Jack ?Fax number:  [phone] ?  ?Type of Clearance Requested:   ?- Medical  ?- Pharmacy:  Hold Aspirin and Ticagrelor (Brilinta)   ?  ?Type of Anesthesia:  General  with INTERSCALENE ?  ?Additional requests/questions:   ? ?Signed, ?Julaine Hua   ?09/29/2021, 4:31 PM  ? ?

## 2021-09-29 NOTE — Telephone Encounter (Signed)
?  ? ?  Primary Cardiologist: Freada Bergeron, MD ? ?Chart reviewed as part of pre-operative protocol coverage. Given past medical history and time since last visit, based on ACC/AHA guidelines, Matthew [name] would be at acceptable risk for the planned procedure without further cardiovascular testing.  ? ?His Brilinta may be held for 5 days prior to his surgery.  Please resume as soon as hemostasis is achieved.  If possible his aspirin should be continued throughout his surgery. ? ?His RCRI is a class IV risk, 11% risk of major cardiac event. ? ?I will route this recommendation to the requesting party via Epic fax function and remove from pre-op pool. ? ?Please call with questions ? ? ? ?Jossie Ng. Owens Hara NP-C ? ?  ?09/29/2021, 4:48 PM ?[place] ?[address] ?Office [phone] Fax [phone] ? ?

## 2021-09-30 LAB — BASIC METABOLIC PANEL
BUN/Creatinine Ratio: 13 (ref 9–20)
BUN: 13 mg/dL (ref 6–24)
CO2: 18 mmol/L — ABNORMAL LOW (ref 20–29)
Calcium: 10.4 mg/dL — ABNORMAL HIGH (ref 8.7–10.2)
Chloride: 102 mmol/L (ref 96–106)
Creatinine, Ser: 0.98 mg/dL (ref 0.76–1.27)
Glucose: 69 mg/dL — ABNORMAL LOW (ref 70–99)
Potassium: 4.1 mmol/L (ref 3.5–5.2)
Sodium: 140 mmol/L (ref 134–144)
eGFR: 98 mL/min/{1.73_m2} (ref >59)

## 2021-09-30 LAB — LITHIUM LEVEL: Lithium Lvl: 0.6 mmol/L (ref 0.5–1.2)

## 2021-09-30 LAB — TSH: TSH: 4.53 u[IU]/mL — ABNORMAL HIGH (ref 0.450–4.500)

## 2021-10-02 ENCOUNTER — Other Ambulatory Visit: Payer: Self-pay | Admitting: Physician Assistant

## 2021-10-02 DIAGNOSIS — F250 Schizoaffective disorder, bipolar type: Secondary | ICD-10-CM

## 2021-10-02 DIAGNOSIS — E032 Hypothyroidism due to medicaments and other exogenous substances: Secondary | ICD-10-CM

## 2021-10-02 MED ORDER — LEVOTHYROXINE SODIUM 25 MCG PO TABS: 25.0000 ug | ORAL_TABLET | Freq: Every morning | ORAL | 1 refills | Status: DC

## 2021-10-02 NOTE — Progress Notes (Signed)
I sent in the order for levothyroxine.  Please mail him the order for the TSH and BMP.  Thank you.

## 2021-10-02 NOTE — Progress Notes (Signed)
Please let him know the lithium level is 0.6.  We like for it to be 0.8-1.2 but since he is doing so well I recommend leaving the dose the same. ?His TSH is borderline high.  Lithium can affect the thyroid.  Has he ever taken levothyroxine?  I would recommend starting that, it can help with mood as well as physical problems such as hair loss, dry itchy skin, inability to lose weight, among other things.  It is not wrong to hold off on treatment and watch, but either way we will need to check labs again in 6 weeks. ?His calcium level is barely elevated, which I am not concerned about at this time but that will also be something I will recheck at his next lab draw. ?Thank you.

## 2021-10-12 NOTE — Progress Notes (Signed)
Sent message, via epic in basket, requesting order in epic from surgeon     10/12/21 1633  Preop Orders  Has preop orders? No  Name of staff/physician contacted for orders(Indicate phone or IB message) B. Manson Passey, PA-C

## 2021-10-17 ENCOUNTER — Telehealth: Payer: Self-pay | Admitting: Cardiology

## 2021-10-17 ENCOUNTER — Telehealth: Payer: Self-pay | Admitting: Physician Assistant

## 2021-10-17 NOTE — Telephone Encounter (Signed)
Pt was calling to let Dr. Shari Prows know that his Psychiatrist started him on levothyroxine 25 mcg po in the morning, about a week or so ago.  Pt states he was started on this, due to abnormal TSH level associated with taking Lithium.  Pt states he was informed that Lithium can affect his thyroid levels, which is why his Psychiatrist ordered for him to start taking synthroid.   Pt states since starting synthroid a week ago, he is having side effects of palpitations and sob.  He states he stopped taking it, and symptoms have improved.   Pt is asking Dr. Shari Prows if she would know of a good thyroid medication for him to take that wouldn't cause him these side effects.  He states he would like to stay on his lithium, for he states this is the best he's ever felt in quite sometime.  Pt has not notified his Psychiatrist, whom is also the ordering Provider, of  medication interaction.  Pt also has an Endocrinologist he is established with, and he has not informed them of this issue as well.   Pt aware that Dr. Shari Prows is out of the office this week and will return next week.   Advised the pt that he needs to reach out to his Psychiatrist to endorse medication side effects.  Also advised him to reach out to his Endocrinologist as well, for further advisement on this.   Pt aware that I will route this message to Dr. Shari Prows to make her aware of this plan.  Pt verbalized understanding and agrees with this plan.

## 2021-10-17 NOTE — Telephone Encounter (Unsigned)
Pt LVM @ 10:31.  Said he is experiencing some side effects of medicine Matthew [name] has him on.  Mainly shortness of breath and racing heart.  He talked to his cardiologist and they told him to call Matthew [name].  He didn't say which med.  Next appt 7/26

## 2021-10-17 NOTE — Telephone Encounter (Signed)
Pt informed

## 2021-10-17 NOTE — Telephone Encounter (Signed)
  Pt c/o medication issue:  1. Name of Medication: levothyroxine (SYNTHROID) 25 MCG tablet  2. How are you currently taking this medication (dosage and times per day)?  Take 1 tablet (25 mcg total) by mouth in the morning. Take with a glass of water, wait 1 hour before taking any other medications or eating.  3. Are you having a reaction (difficulty breathing--STAT)?   4. What is your medication issue? Pt said, he is experiencing palpitation and sob when taking this meds. He requesting to speak with a nurse

## 2021-10-17 NOTE — Telephone Encounter (Signed)
Have him stay off the levothyroxine.  If he is still having heart racing then he needs to get back in touch with his cardiologist, or see his PCP.  Thank you.

## 2021-10-17 NOTE — Telephone Encounter (Signed)
Kaston, Faughn - 10/17/2021 10:08 AM Meriam Sprague, MD  Sent: Tue Oct 17, 2021  2:33 PM  To: Loa Socks, LPN          Message  [place] idea. Thank you for letting me know.

## 2021-10-17 NOTE — Telephone Encounter (Signed)
Pt stated he has been taking levothyroxine for a week and he stopped due to side effects named above.A nurse mentioned lithium could be interfering and recommended he split the dose between morning and night instead of taking all at once.He did follow directions for levothyroxine on the bottle.Please advise.

## 2021-10-18 ENCOUNTER — Other Ambulatory Visit: Payer: Self-pay | Admitting: Physician Assistant

## 2021-10-18 DIAGNOSIS — F411 Generalized anxiety disorder: Secondary | ICD-10-CM

## 2021-10-20 ENCOUNTER — Inpatient Hospital Stay (HOSPITAL_COMMUNITY)
Admission: RE | Admit: 2021-10-20 | Discharge: 2021-10-20 | Disposition: A | Discharge status: Home / Self Care | Payer: Medicaid Other | Source: Ambulatory Visit

## 2021-10-25 ENCOUNTER — Other Ambulatory Visit: Payer: Self-pay | Admitting: Pharmacist

## 2021-10-25 DIAGNOSIS — E782 Mixed hyperlipidemia: Secondary | ICD-10-CM

## 2021-11-02 ENCOUNTER — Other Ambulatory Visit: Payer: Medicaid Other

## 2021-11-02 ENCOUNTER — Other Ambulatory Visit: Payer: Self-pay

## 2021-11-02 DIAGNOSIS — E782 Mixed hyperlipidemia: Secondary | ICD-10-CM

## 2021-11-03 ENCOUNTER — Telehealth: Payer: Self-pay | Admitting: Pharmacist

## 2021-11-03 LAB — APOLIPOPROTEIN B: Apolipoprotein B: 66 mg/dL (ref <90)

## 2021-11-03 LAB — LIPID PANEL
Chol/HDL Ratio: 4.4 ratio (ref 0.0–5.0)
Cholesterol, Total: 105 mg/dL (ref 100–199)
HDL: 24 mg/dL — ABNORMAL LOW (ref >39)
LDL Chol Calc (NIH): 23 mg/dL (ref 0–99)
Triglycerides: 416 mg/dL — ABNORMAL HIGH (ref 0–149)
VLDL Cholesterol Cal: 58 mg/dL — ABNORMAL HIGH (ref 5–40)

## 2021-11-03 LAB — LIPOPROTEIN A (LPA): Lipoprotein (a): 16.8 nmol/L (ref <75.0)

## 2021-11-03 MED ORDER — FENOFIBRATE 145 MG PO TABS: 145.0000 mg | ORAL_TABLET | Freq: Every day | ORAL | 3 refills | Status: DC

## 2021-11-03 NOTE — Telephone Encounter (Signed)
Called patient. Labs were fasting. Patient has been adherent to atorvastatin 80mg  daily and Vascepa 2g BID. Has had better adherence to insulin. Blood sugars have improved. TG have improved but still >400. LDL-C and ApoB are good. Patient agreeable to starting fenofibrate. Will recheck lipids on 9/11.

## 2021-11-06 ENCOUNTER — Telehealth: Payer: Self-pay

## 2021-11-06 NOTE — Telephone Encounter (Signed)
Patient notified and will contact prescribing provider

## 2021-11-06 NOTE — Telephone Encounter (Signed)
Patient states that he was placed on Levothyroxine by his physiatrist and it's causing him to have heart palpations and would like to know if there is another medication.

## 2021-11-09 NOTE — Progress Notes (Unsigned)
Cardiology Office Note:    Date:  11/09/2021   ID:  Matthew [name], DOB 27-Sep-1975, MRN [id number]  PCP:  [name], MD   [place]  Cardiologist:  Freada Bergeron, MD  Advanced Practice Provider:  No care team member to display Electrophysiologist:  None   Referring MD: [name], MD    History of Present Illness:    Matthew [name] is a 46 y.o. male with a hx of HTN, HLD, pre-DM, celiac dz, schizoaffective d/o, anxiety, ADHD, OSA not on CPAP, and recent NSTEMI s/p DES to pLAD and DES to dRCA who presents to clinic for follow-up.   Patient admitted with NSTEMI 06/06/20 treated with DES pLAD and DES dRCA, and severe LVEF 25-30%. Patient left AMA 06/08/19 and was seen urgently in clinic by Matthew [name]. During that visit, he was more SOB and had been eating a lot of fast food and had not filled his scripts for atenolol or lisinopril. Since that time, he has filled his prescriptions and has been taking all medications as prescribed. He understands the importance of taking his medications and that stopping his ASA or brilinta could lead to a massive MI and/or death. He states that since his hospitalization, he has been feeling overall okay.  Occasionally has some palpitations with some short of breath and mild lightheadedness. Episodes occur about every other day and last a couple of minutes before resolving. Can happen at rest or with activity. No chest pain.   During visit on 06/22/20, the patient had been having palpitations. He was referred for cardiac monitor and cardiac rehab. Cardiac monitor showed: rare ectopy with no significant arrhythmias or ectopy.  During visit on 07/20/20 he was having intermittent chest tightness. He has a subsequent ER visit on 08/10/20 for abdominal pain. Work-up reassuring and he was discharged in imdur 30mg  daily as well as nitro.  During visit on 08/31/20, he was very tachycardic usually related to heavy smoking. Was not  taking his imdur, nitro, jardiance or insulin. He was otherwise doing well from a CV standpoint.  During his last appointment on 10/10/20, he continued to do well from a CV standpoint but complained of lower back pain. He was down to smoking 1 ppd and improving medication compliance including his insulin.   Was admitted on 10/07-10/08/22 for chest pain. Cath showed patent LAD stent, 25% Lcx, 25-45% prox RCA disease, anterior hypokinesis with EF 50%, LVEDP 50mmHg. Recommended for continued medical management.  Readmitted 04/23/21 for chest pain and syncope. ECG nonischemic. Trop negative. TTE with LVEF 60-65%, normal RV, no significant valve disease. Symptoms thought to be vagal in nature as he fainted with coughing. Discharged home.  He saw Matthew Browner, NP 05/31/21 after ED visit for syncope. He reported his episodes occurred while in the bathroom or coughing. He also reported sharp mid-lower chest pain that could last for hours up to days that was not improved with nitroglycerin. He was back to smoking 2 to 3 ppd. Lexiscan Myoview was reordered but not performed.   Was last seen in clinic on 07/2021 where he was doing well. Stopped lisinopril and fainting episodes and cough resolved. Continued to smoke 2ppd. Has been started on fenofibrate for persistently elevated TG.  Today, ***  Past Medical History:  Diagnosis Date   Anxiety    Celiac disease    Coronary artery disease    Dairy allergy    Depression    Diabetes mellitus without complication ([place])  High cholesterol    Hypertension    Paranoia (HCC)    Pre-diabetes    Schizoaffective disorder [facility])     Past Surgical History:  Procedure Laterality Date   APPENDECTOMY     BRAIN SURGERY     CARDIAC CATHETERIZATION     CORONARY STENT INTERVENTION N/A 06/06/2020   Procedure: CORONARY STENT INTERVENTION;  Surgeon: Swaziland, Peter M, MD;  Location: [facility] INVASIVE CV LAB;  Service: Cardiovascular;  Laterality: N/A;  prox LAD, distal RCA    INTRAVASCULAR ULTRASOUND/IVUS N/A 06/06/2020   Procedure: Intravascular Ultrasound/IVUS;  Surgeon: Swaziland, Peter M, MD;  Location: [facility] INVASIVE CV LAB;  Service: Cardiovascular;  Laterality: N/A;   LEFT HEART CATH AND CORONARY ANGIOGRAPHY N/A 06/06/2020   Procedure: LEFT HEART CATH AND CORONARY ANGIOGRAPHY;  Surgeon: Swaziland, Peter M, MD;  Location: [facility] INVASIVE CV LAB;  Service: Cardiovascular;  Laterality: N/A;   LEFT HEART CATH AND CORONARY ANGIOGRAPHY N/A 02/24/2021   Procedure: LEFT HEART CATH AND CORONARY ANGIOGRAPHY;  Surgeon: Matthew Records, MD;  Location: [facility] INVASIVE CV LAB;  Service: Cardiovascular;  Laterality: N/A;   NASAL SEPTUM SURGERY     WRIST FRACTURE SURGERY     right wrist    Current Medications: No outpatient medications have been marked as taking for the 11/10/21 encounter (Appointment) with Meriam Sprague, MD.     Allergies:   Gluten meal and Lactose intolerance (gi)   Social History   Socioeconomic History   Marital status: Single    Spouse name: Not on file   Number of children: 0   Years of education: 14   Highest education level: Associate degree: occupational, Scientist, product/process development, or vocational program  Occupational History   Occupation: [employer], unemployed  Tobacco Use   Smoking status: Every Day    Packs/day: 1.00    Types: Cigarettes, E-cigarettes   Smokeless tobacco: Never  Vaping Use   Vaping Use: Never used  Substance and Sexual Activity   Alcohol use: No    Alcohol/week: 0.0 standard drinks of alcohol    Comment: no (02/08/15)   Drug use: No   Sexual activity: Not on file  Other Topics Concern   Not on file  Social History Narrative   Pt lives at the [employer]   Social Determinants of Health   Financial Resource Strain: Not on file  Food Insecurity: Not on file  Transportation Needs: Not on file  Physical Activity: Not on file  Stress: Not on file  Social Connections: Not on file     Family History: The patient's family history  includes Asthma in his mother; Hypertension in his father and mother.  ROS:   Please see the history of present illness.    Review of Systems  Constitutional:  Negative for chills, fever and malaise/fatigue.  HENT:  Negative for congestion and hearing loss.   Eyes:  Negative for blurred vision and redness.  Respiratory:  Negative for cough and shortness of breath.   Cardiovascular:  Negative for chest pain, palpitations, orthopnea, claudication, leg swelling and PND.  Gastrointestinal:  Negative for constipation, nausea and vomiting.  Genitourinary:  Negative for dysuria and flank pain.  Musculoskeletal:  Negative for back pain and falls.       Positive for bilateral hand swelling  Neurological:  Positive for sensory change (R 4th and 5th fingers). Negative for dizziness, loss of consciousness and headaches.  Endo/Heme/Allergies:  Negative for polydipsia.  Psychiatric/Behavioral:  Positive for hallucinations (Auditory) and substance abuse (Tobacco).  EKGs/Labs/Other Studies Reviewed:    The following studies were reviewed today: Carotid Duplex 06/14/21 Summary:  Right Carotid: The extracranial vessels were near-normal with only minimal wall thickening or plaque.   Left Carotid: The extracranial vessels were near-normal with only minimal wall thickening or plaque.   Vertebrals:  Bilateral vertebral arteries demonstrate antegrade flow.   Subclavians: Normal flow hemodynamics were seen in bilateral subclavian arteries.  Monitor 05/19/21 Patch wear time was 11 days and 17 hours Predominant rhythm was NSR/sinus tachycardia with average HR 101bpm (ranging 56-141) Rare ectopy (<1% SVE; <1% VE) No sustained arrhythmias or significant pauses.     Patch Wear Time:  11 days and 17 hours (2022-12-12T19:25:03-499 to 2022-12-24T12:40:12-498)   Patient had a min HR of 56 bpm, max HR of 140 bpm, and avg HR of 101 bpm. Predominant underlying rhythm was Sinus Rhythm. Isolated SVEs were  rare (<1.0%), and no SVE Couplets or SVE Triplets were present. Isolated VEs were rare (<1.0%, 9085), VE Couplets  were rare (<1.0%, 31), and VE Triplets were rare (<1.0%, 1). Ventricular Bigeminy and Trigeminy were present.   Echo 04/24/21 1. Left ventricular ejection fraction, by estimation, is 60 to 65%. The left ventricle has normal function. The left ventricle has no regional wall motion abnormalities. There is mild left ventricular hypertrophy. Left ventricular diastolic parameters were normal.   2. Right ventricular systolic function is normal. The right ventricular size is normal. Tricuspid regurgitation signal is inadequate for assessing PA pressure.   3. The mitral valve is normal in structure. No evidence of mitral valve regurgitation.   4. The aortic valve was not well visualized. Aortic valve regurgitation is not visualized. No aortic stenosis is present.   L Heart Cath 02/24/21 CONCLUSIONS: Normal left main Patent LAD stent with minimal luminal irregularities otherwise. 25% mid circumflex. Dominant right coronary with patent distal RCA stent.  Proximal tandem 25 and 45% stenoses.  Mild to moderate diffuse atherosclerosis noted in the right coronary. Anterior hypokinesis with EF 50%.  LVEDP 7 mmHg.   RECOMMENDATIONS:   Aggressive risk factor modification including smoking cessation, glycemic control, blood pressure control, and lipid management of LDL to less than 70.  Echo 08/12/20 1. Left ventricular ejection fraction, by estimation, is 65 to 70%. The left ventricle has normal function. The left ventricle has no regional wall motion abnormalities. There is mild left ventricular hypertrophy. Left ventricular diastolic parameters were normal.   2. Right ventricular systolic function is normal. The right ventricular size is normal.   3. The mitral valve is normal in structure. No evidence of mitral valve regurgitation. No evidence of mitral stenosis.   4. The aortic valve is normal  in structure. Aortic valve regurgitation is not visualized. No aortic stenosis is present.   5. The inferior vena cava is normal in size with greater than 50% respiratory variability, suggesting right atrial pressure of 3 mmHg.   Cardiac monitor 07/19/20: Patch Wear Time:  7 days and 17 hours (2022-02-12T16:36:22-0500 to 2022-02-20T10:06:03-499)   Patient had a min HR of 55 bpm, max HR of 135 bpm, and avg HR of 88 bpm. Predominant underlying rhythm was Sinus Rhythm. No Isolated SVEs, SVE Couplets, or SVE Triplets were present. Isolated VEs were rare (<1.0%), VE Couplets were rare (<1.0%), and no  VE Triplets were present.   Left Heart Cath 06/06/2020:   1. Severe 2 vessel obstructive CAD    - 99% proximal LAD    - 90% distal RCA 2. Severe LV dysfunction. EF  estimated at 25-30%. Segmental wall motion abnormalities. 3. Moderately elevated LVEDP 4. Successful PCI of the proximal LAD with IVUS guidance and DES x 1 5. Successful PCI of the distal RCA with IVUS guidance and DES x 1  EKG:  08/09/21: Sinus tachycardia, HR 108; anterior infarct 10/10/20: Sinus tachycardia, rate: 109, poor r-wave progression, inferior Q waves, no changes from his previous visit    Recent Labs: 04/24/2021: Magnesium 2.3 04/26/2021: ALT 61 05/23/2021: Hemoglobin 17.6; Platelets 283 09/29/2021: BUN 13; Creatinine, Ser 0.98; Potassium 4.1; Sodium 140; TSH 4.530  Recent Lipid Panel    Component Value Date/Time   CHOL 105 11/02/2021 0747   TRIG 416 (H) 11/02/2021 0747   HDL 24 (L) 11/02/2021 0747   CHOLHDL 4.4 11/02/2021 0747   CHOLHDL 7.2 02/25/2021 0139   VLDL UNABLE TO CALCULATE IF TRIGLYCERIDE OVER 400 mg/dL 02/25/2021 0139   LDLCALC 23 11/02/2021 0747   LDLDIRECT 42.4 02/25/2021 0139     Physical Exam:     VS:  There were no vitals taken for this visit.    Wt Readings from Last 3 Encounters:  09/18/21 261 lb (118.4 kg)  08/09/21 265 lb 3.2 oz (120.3 kg)  07/04/21 250 lb (113.4 kg)     GEN:  Well  nourished, well developed in no acute distress HEENT: Normal NECK: No JVD; No carotid bruits CARDIAC: Tachycardic regular, no murmurs, rubs, gallops RESPIRATORY:  Clear to auscultation without rales, wheezing or rhonchi  ABDOMEN: Soft, non-tender, non-distended MUSCULOSKELETAL:  No edema; No deformity  SKIN: Warm and dry NEUROLOGIC:  Alert and oriented x 3 PSYCHIATRIC:  Normal affect   ASSESSMENT:    No diagnosis found.   PLAN:    In order of problems listed above:  #NSTEMI: #Multivessel CAD: Patient presented to [facility] on [facility]20 with significant chest pain found to have elevated troponin c/w NSTEMI. Underwent coronary angiography which revealed 99% proximal LAD and 90% distal RCA both of which were treated successfuly with DES. LV gram with LVEF 25-30%. Left AMA after that hospitalization. Repeat TTEs (latest 04/2021) with recovered EF 60-65%. Has had numerous subsequent ER visits for chest pain. Repeat cath in 02/2021 with stable disease, patent LAD stent and RCA stent. Recommended for continued medical management. -TTE with recovered EF 60-65%, no significant WMA -Continue ASA 81mg  daily -Change brilinta to plavix 75mg  daily -Continue coreg 25mg  BID -Continue lipitor 80mg  daily -Continue spironolactone 12.5mg  daily -Continue jardiance 10mg  daily -Off lisinopril for cough -Tobacco cessation counseling provided extensively today; patient states he wants to try to cut back -Counseled about diet and exercise -Needs continued encouragement about medication compliance   #Ischemic Cardiomyopathy: #Chronic Systolic heart faillure with improved EF EF approximately 25-30% on LV gram-->improved to 65-70% on TTE on 08/12/20. Euvolemic on examination. Currently with NYHA class II symptoms.  -TTE with recovered EF 60-65% -Off lisinopril for cough -Continue coreg 25mg  BID as above -Continue spironolactone 12.5mg  daily and jardiance 10mg  daily as above -Low Na diet  #Mixed HLD: Followed  by lipid clinic. -Continue lipitor 80mg  daily, vascepa 2gBID, fenofibrate 145mg  daily  #Syncope: Resolved after stopping lisinopril for cough. No further episodes. -Resolved after stopping lisinopril   #Palpitations: Improved. Cardiac monitor without significant ectopy. Rare PVCs.  -Continue coreg 25mg  BID   #DMII: A1C improving 12.8>9.2 in 6.2022 -Follow-up with Endocrinology as scheduled -Continue metformin 1000mg  daily -Continue jardiance 25mg  daily -Continue insulin   #OSA not on CPAP: -Follow-up with Pulm   #Tobacco abuse: -Continue nicotine patches -Needs extensive counseling going  forward   #Schoizoaffective disorder: #Depression: -Management per psych  #Low Back Pain: Follows with NSGY. Wants to undergo injection therapy. Okay to hold DAPT for this as last PCI was in 05/2020.  Medication Adjustments/Labs and Tests Ordered: Current medicines are reviewed at length with the patient today.  Concerns regarding medicines are outlined above.  No orders of the defined types were placed in this encounter.  No orders of the defined types were placed in this encounter.   There are no Patient Instructions on file for this visit.   I,Mykaella Javier,acting as a scribe for Meriam Sprague, MD.,have documented all relevant documentation on the behalf of Meriam Sprague, MD,as directed by  Meriam Sprague, MD while in the presence of Meriam Sprague, MD.  I, Meriam Sprague, MD, have reviewed all documentation for this visit. The documentation on 11/09/21 for the exam, diagnosis, procedures, and orders are all accurate and complete.   Signed, Meriam Sprague, MD  11/09/2021 12:15 PM    [facility]

## 2021-11-10 ENCOUNTER — Ambulatory Visit (INDEPENDENT_AMBULATORY_CARE_PROVIDER_SITE_OTHER): Payer: Medicaid Other | Admitting: Cardiology

## 2021-11-10 ENCOUNTER — Encounter: Payer: Self-pay | Admitting: Cardiology

## 2021-11-10 VITALS — BP 128/72 | HR 85 | Ht 76.0 in | Wt 264.4 lb

## 2021-11-10 DIAGNOSIS — I2510 Atherosclerotic heart disease of native coronary artery without angina pectoris: Secondary | ICD-10-CM | POA: Diagnosis not present

## 2021-11-10 DIAGNOSIS — R7989 Other specified abnormal findings of blood chemistry: Secondary | ICD-10-CM

## 2021-11-10 DIAGNOSIS — E782 Mixed hyperlipidemia: Secondary | ICD-10-CM | POA: Diagnosis not present

## 2021-11-10 DIAGNOSIS — I5032 Chronic diastolic (congestive) heart failure: Secondary | ICD-10-CM

## 2021-11-10 DIAGNOSIS — Z79899 Other long term (current) drug therapy: Secondary | ICD-10-CM

## 2021-11-10 DIAGNOSIS — E785 Hyperlipidemia, unspecified: Secondary | ICD-10-CM

## 2021-11-10 DIAGNOSIS — I10 Essential (primary) hypertension: Secondary | ICD-10-CM

## 2021-11-10 LAB — HEPATIC FUNCTION PANEL
ALT: 29 IU/L (ref 0–44)
AST: 21 IU/L (ref 0–40)
Albumin: 4.9 g/dL (ref 4.0–5.0)
Alkaline Phosphatase: 96 IU/L (ref 44–121)
Bilirubin Total: 0.4 mg/dL (ref 0.0–1.2)
Bilirubin, Direct: 0.13 mg/dL (ref 0.00–0.40)
Total Protein: 7.3 g/dL (ref 6.0–8.5)

## 2021-11-10 MED ORDER — CLOPIDOGREL BISULFATE 75 MG PO TABS: 75.0000 mg | ORAL_TABLET | Freq: Every day | ORAL | 3 refills | Status: DC

## 2021-11-13 ENCOUNTER — Other Ambulatory Visit: Payer: Self-pay | Admitting: Physician Assistant

## 2021-11-16 NOTE — Patient Instructions (Addendum)
DUE TO SPACE LIMITATIONS, ONLY TWO VISITORS  (aged 44 and older) ARE ALLOWED TO COME WITH YOU AND STAY IN THE WAITING ROOM DURING YOUR PRE OP AND PROCEDURE.   **NO VISITORS ARE ALLOWED IN THE SHORT STAY AREA OR RECOVERY ROOM!!**  IF YOU WILL BE ADMITTED INTO THE HOSPITAL YOU ARE ALLOWED ONLY FOUR SUPPORT PEOPLE DURING VISITATION HOURS (7 AM -8PM)   The support person(s) must pass our screening, and use Hand sanitizing gel. Visitors GUEST BADGE MUST BE WORN VISIBLY  One adult visitor may remain with you overnight and MUST be in the room by 8 P.M.   You are not required to quarantine at this time prior to your surgery. However, you must do this: Hand Hygiene often Do NOT share personal items Notify your provider if you are in close contact with someone who has COVID or you develop fever 100.4 or greater, new onset of sneezing, cough, sore throat, shortness of breath or body aches.       Your procedure is scheduled on:  Tuesday, November 28, 2021  Report to [facility] Main Entrance.  Report to admitting at:  11:00 AM  +++++Call this number if you have any questions or problems the morning of surgery [phone]  Do not eat food :After Midnight the night prior to your surgery/procedure.  After Midnight you may have the following liquids until   10:30 AM DAY OF SURGERY  Clear Liquid Diet Water Black Coffee (sugar ok, NO MILK/CREAM OR CREAMERS)  Tea (sugar ok, NO MILK/CREAM OR CREAMERS) regular and decaf                             Plain Jell-O (NO RED)                                           Fruit ices (not with fruit pulp, NO RED)                                     Popsicles (NO RED)                                                                  Juice: apple, WHITE grape, WHITE cranberry Sports drinks like Gatorade (NO RED) Clear broth(vegetable,chicken,beef)                    The day of surgery:  Drink ONE (1) Pre-Surgery G2 at  10:30 AM the morning of surgery.  Drink in one sitting. Do not sip.  This drink was given to you during your hospital  pre-op appointment visit. Nothing else to drink after completing the Pre-Surgery G2.   If you have questions, please contact your surgeon's office.   FOLLOW ANY ADDITIONAL PRE OP INSTRUCTIONS YOU RECEIVED FROM YOUR SURGEON'S OFFICE!!!   Oral Hygiene is also important to reduce your risk of infection.        Remember - BRUSH YOUR TEETH THE MORNING OF SURGERY WITH YOUR REGULAR TOOTHPASTE  Do NOT smoke after  Midnight the night before surgery.  Take ONLY these medicines the morning of surgery with A SIP OF WATER: Synthroid, Gabapentin, Cymbalta, Lithium, Perphenzine, Carvedilol, Fenofibrate,  Atorvastatin. You make take your Klonopin if needed   How to Manage Your Diabetes Before and After Surgery  Why is it important to control my blood sugar before and after surgery? Improving blood sugar levels before and after surgery helps healing and can limit problems. A way of improving blood sugar control is eating a healthy diet by:  Eating less sugar and carbohydrates  Increasing activity/exercise  Talking with your doctor about reaching your blood sugar goals High blood sugars (greater than 180 mg/dL) can raise your risk of infections and slow your recovery, so you will need to focus on controlling your diabetes during the weeks before surgery. Make sure that the doctor who takes care of your diabetes knows about your planned surgery including the date and location.  How do I manage my blood sugar before surgery? Check your blood sugar at least 4 times a day, starting 2 days before surgery, to make sure that the level is not too high or low. Check your blood sugar the morning of your surgery when you wake up and every 2 hours until you get to the Short Stay unit. If your blood sugar is less than 70 mg/dL, you will need to treat for low blood sugar: Do not take insulin. Treat a low blood sugar (less than 70  mg/dL) with  cup of clear juice (cranberry or apple), 4 glucose tablets, OR glucose gel. Recheck blood sugar in 15 minutes after treatment (to make sure it is greater than 70 mg/dL). If your blood sugar is not greater than 70 mg/dL on recheck, call [phone] for further instructions. Report your blood sugar to the short stay nurse when you get to Short Stay.  If you are admitted to the hospital after surgery: Your blood sugar will be checked by the staff and you will probably be given insulin after surgery (instead of oral diabetes medicines) to make sure you have good blood sugar levels. The goal for blood sugar control after surgery is 80-180 mg/dL.  WHAT DO I DO ABOUT MY DIABETES MEDICATION?  THE DAY BEFORE SURGERY, take  10 units of  Lantus  insulin.  Take your usual doses of METFORMIN.  Do not take Jardiance      THE MORNING OF SURGERY, take 10 units of    Lantus insulin.  DO NOT TAKE your METFORMIN the day of surgery, do not take Jardiance.                    You may not have any metal on your body including jewelry, and body piercing  Do not wear  lotions, powders, cologne, or deodorant  Men may shave face and neck.  DO NOT BRING YOUR HOME MEDICATIONS TO THE HOSPITAL. PHARMACY WILL DISPENSE MEDICATIONS LISTED ON YOUR MEDICATION LIST TO YOU DURING YOUR ADMISSION IN THE HOSPITAL!   Patients discharged on the day of surgery will not be allowed to drive home.  Someone NEEDS to stay with you for the first 24 hours after anesthesia.   Please read over the following fact sheets you were given: IF YOU HAVE QUESTIONS ABOUT YOUR PRE-OP INSTRUCTIONS, PLEASE CALL [phone]  (KAY)   [place] - Preparing for Surgery Before surgery, you can play an important role.  Because skin is not sterile, your skin needs to be as free of  germs as possible.  You can reduce the number of germs on your skin by washing with CHG (chlorahexidine gluconate) soap before surgery.  CHG is an antiseptic  cleaner which kills germs and bonds with the skin to continue killing germs even after washing. Please DO NOT use if you have an allergy to CHG or antibacterial soaps.  If your skin becomes reddened/irritated stop using the CHG and inform your nurse when you arrive at Short Stay. Do not shave (including legs and underarms) for at least 48 hours prior to the first CHG shower.  You may shave your face/neck.  Please follow these instructions carefully:  1.  Shower with CHG Soap the night before surgery and the  morning of surgery.  2.  If you choose to wash your hair, wash your hair first as usual with your normal  shampoo.  3.  After you shampoo, rinse your hair and body thoroughly to remove the shampoo.                             4.  Use CHG as you would any other liquid soap.  You can apply chg directly to the skin and wash.  Gently with a scrungie or clean washcloth.  5.  Apply the CHG Soap to your body ONLY FROM THE NECK DOWN.   Do not use on face/ open                           Wound or open sores. Avoid contact with eyes, ears mouth and genitals (private parts).                       Wash face,  Genitals (private parts) with your normal soap.             6.  Wash thoroughly, paying special attention to the area where your  surgery  will be performed.  7.  Thoroughly rinse your body with warm water from the neck down.  8.  DO NOT shower/wash with your normal soap after using and rinsing off the CHG Soap.            9.  Pat yourself dry with a clean towel.            10.  Wear clean pajamas.            11.  Place clean sheets on your bed the night of your first shower and do not  sleep with pets.  ON THE DAY OF SURGERY : Do not apply any lotions/deodorants the morning of surgery.  Please wear clean clothes to the hospital/surgery center.  Preparing for Total Shoulder Arthroplasty ================================================================= Please follow these instructions carefully,  in addition to any other special Bathing information that was explained to you at the Presurgical Appointment:  BENZOYL PEROXIDE 5% GEL: Used to kill bacteria on the skin which could cause an infection at the surgery site.   Please do not use if you have an allergy to benzoyl peroxide. If your skin becomes reddened/irritated stop using the benzoyl peroxide and inform your Doctor.   Starting two days before surgery, apply as follows:  1. Apply benzoyl peroxide gel in the morning and at night. Apply after taking a shower. If you are not taking a shower, clean entire shoulder front, back, and side, along with the armpit with a clean wet washcloth.  2. Place a quarter-sized dollop of the gel on your SHOULDER and rub in thoroughly, making sure to cover the front, back, and side of your shoulder, along with the armpit.   2 Days prior to Surgery First Dose  _______ Morning Second Dose  _______ Night  Day Before Surgery First Dose  ______ Morning  On the night before surgery, wash your entire body (except hair, face and private areas) with CHG Soap. THEN, rub in the LAST application of the Benzoyl Peroxide Gel on your shoulder.   3. On the Morning of Surgery wash your BODY AGAIN with CHG Soap (except hair, face and private areas)  4. DO NOT USE THE BENZOYL PEROXIDE GEL ON THE DAY OF YOUR SURGERY       FAILURE TO FOLLOW THESE INSTRUCTIONS MAY RESULT IN THE CANCELLATION OF YOUR SURGERY  PATIENT SIGNATURE_________________________________  NURSE SIGNATURE__________________________________  ________________________________________________________________________    Matthew [name]    An incentive spirometer is a tool that can help keep your lungs clear and active. This tool measures how well you are filling your lungs with each breath. Taking long deep breaths may help reverse or decrease the chance of developing breathing (pulmonary) problems (especially infection) following: A long  period of time when you are unable to move or be active. BEFORE THE PROCEDURE  If the spirometer includes an indicator to show your best effort, your nurse or respiratory therapist will set it to a desired goal. If possible, sit up straight or lean slightly forward. Try not to slouch. Hold the incentive spirometer in an upright position. INSTRUCTIONS FOR USE  Sit on the edge of your bed if possible, or sit up as far as you can in bed or on a chair. Hold the incentive spirometer in an upright position. Breathe out normally. Place the mouthpiece in your mouth and seal your lips tightly around it. Breathe in slowly and as deeply as possible, raising the piston or the ball toward the top of the column. Hold your breath for 3-5 seconds or for as long as possible. Allow the piston or ball to fall to the bottom of the column. Remove the mouthpiece from your mouth and breathe out normally. Rest for a few seconds and repeat Steps 1 through 7 at least 10 times every 1-2 hours when you are awake. Take your time and take a few normal breaths between deep breaths. The spirometer may include an indicator to show your best effort. Use the indicator as a goal to work toward during each repetition. After each set of 10 deep breaths, practice coughing to be sure your lungs are clear. If you have an incision (the cut made at the time of surgery), support your incision when coughing by placing a pillow or rolled up towels firmly against it. Once you are able to get out of bed, walk around indoors and cough well. You may stop using the incentive spirometer when instructed by your caregiver.  RISKS AND COMPLICATIONS Take your time so you do not get dizzy or light-headed. If you are in pain, you may need to take or ask for pain medication before doing incentive spirometry. It is harder to take a deep breath if you are having pain. AFTER USE Rest and breathe slowly and easily. It can be helpful to keep track of a log  of your progress. Your caregiver can provide you with a simple table to help with this. If you are using the spirometer at home, follow these instructions: SEEK  MEDICAL CARE IF:  You are having difficultly using the spirometer. You have trouble using the spirometer as often as instructed. Your pain medication is not giving enough relief while using the spirometer. You develop fever of 100.5 F (38.1 C) or higher.                                                                                                    SEEK IMMEDIATE MEDICAL CARE IF:  You cough up bloody sputum that had not been present before. You develop fever of 102 F (38.9 C) or greater. You develop worsening pain at or near the incision site. MAKE SURE YOU:  Understand these instructions. Will watch your condition. Will get help right away if you are not doing well or get worse. Document Released: 09/17/2006 Document Revised: 07/30/2011 Document Reviewed: 11/18/2006 [facility] Patient Information 2014 [place], [place].

## 2021-11-16 NOTE — Progress Notes (Addendum)
COVID Vaccine received:  [x]  No []  Yes  Date of any COVID positive Test in last 90 days: no  PCP - , MD Cardiologist - , MD  Chest x-ray - 05-23-21  Epic EKG -  08-10-21 Epic Stress Test -  ECHO - 04-24-21  Epic Cardiac Cath -  no Zio Patch Monitor - 05-19-2021  CT Chest PE- 04-21-21  Epic  Pacemaker/ICD device last checked: Date:    [x]  N/A Spinal Cord Stimulator:[x]  No []  Yes   Other Implants:   History of Sleep Apnea? []  No [x]  Yes   Sleep Study Date:  2016 CPAP used?- [x]  No []  Yes    (Patient is trying to get a new CPAP machine at this time)  Does the patient monitor blood sugar? []  No    [x]  Yes  []  N/A Does patient have a 11-11-1968 or Dexacom? [x]  No []  Yes   Fasting Blood Sugar Ranges- 117-169 Checks Blood Sugar __1___ times a day  Blood Thinner Instructions: Plavix    ? Hold - Patient had no instructions on stopping Plavix, He was going to call Dr. office on Monday to get additional information.  Per Dr. 11-10-21 note it is okay for him to hold both meds but does not specify how many days to hold.   Aspirin Instructions: Aspirin  Last Dose:  Activity level:  Can go up a flight of stairs and perform activities of daily living without stopping and without symptoms of chest pain or shortness of breath.[]  No [x]  Yes  []  N/A  Able to do exercises without symptoms []  No [x]  Yes  []  N/A  Patient able to complete ADLs without assistance []  No [x]  Yes  []  N/A    Comments:   Anesthesia review: NSTEMI 05-2020, Cath w/ DES, Ischemic CM, HFimpEF, recent palps and SOB, fatty liver disease, schizoaffective disorder,   Patient denies shortness of breath, fever, cough and chest pain at PAT appointment  Patient verbalized understanding and agreement to the Pre-Surgical Instructions that were given to them at this PAT appointment. Patient was also educated of the need to review these PAT instructions again prior to his/her  surgery.I reviewed the appropriate phone numbers to call if they have any and questions or concerns.

## 2021-11-17 ENCOUNTER — Encounter (HOSPITAL_COMMUNITY): Payer: Self-pay

## 2021-11-17 ENCOUNTER — Encounter (HOSPITAL_COMMUNITY)
Admission: RE | Admit: 2021-11-17 | Discharge: 2021-11-17 | Disposition: A | Discharge status: Home / Self Care | Payer: Medicaid Other | Source: Ambulatory Visit | Attending: Orthopedic Surgery | Admitting: Orthopedic Surgery

## 2021-11-17 ENCOUNTER — Other Ambulatory Visit: Payer: Self-pay

## 2021-11-17 VITALS — BP 118/74 | HR 82 | Temp 98.2°F | Resp 20 | Ht 75.0 in | Wt 258.0 lb

## 2021-11-17 DIAGNOSIS — G4733 Obstructive sleep apnea (adult) (pediatric): Secondary | ICD-10-CM | POA: Diagnosis not present

## 2021-11-17 DIAGNOSIS — Z9989 Dependence on other enabling machines and devices: Secondary | ICD-10-CM | POA: Insufficient documentation

## 2021-11-17 DIAGNOSIS — Z955 Presence of coronary angioplasty implant and graft: Secondary | ICD-10-CM | POA: Insufficient documentation

## 2021-11-17 DIAGNOSIS — K769 Liver disease, unspecified: Secondary | ICD-10-CM | POA: Diagnosis not present

## 2021-11-17 DIAGNOSIS — E119 Type 2 diabetes mellitus without complications: Secondary | ICD-10-CM | POA: Diagnosis not present

## 2021-11-17 DIAGNOSIS — I10 Essential (primary) hypertension: Secondary | ICD-10-CM | POA: Insufficient documentation

## 2021-11-17 DIAGNOSIS — Z01812 Encounter for preprocedural laboratory examination: Secondary | ICD-10-CM | POA: Diagnosis present

## 2021-11-17 DIAGNOSIS — M19011 Primary osteoarthritis, right shoulder: Secondary | ICD-10-CM | POA: Insufficient documentation

## 2021-11-17 DIAGNOSIS — Z87891 Personal history of nicotine dependence: Secondary | ICD-10-CM | POA: Insufficient documentation

## 2021-11-17 DIAGNOSIS — E039 Hypothyroidism, unspecified: Secondary | ICD-10-CM | POA: Diagnosis not present

## 2021-11-17 DIAGNOSIS — I255 Ischemic cardiomyopathy: Secondary | ICD-10-CM | POA: Diagnosis not present

## 2021-11-17 DIAGNOSIS — F209 Schizophrenia, unspecified: Secondary | ICD-10-CM | POA: Diagnosis not present

## 2021-11-17 DIAGNOSIS — I2510 Atherosclerotic heart disease of native coronary artery without angina pectoris: Secondary | ICD-10-CM | POA: Diagnosis not present

## 2021-11-17 DIAGNOSIS — Z01818 Encounter for other preprocedural examination: Secondary | ICD-10-CM

## 2021-11-17 HISTORY — DX: Unspecified osteoarthritis, unspecified site: M19.90

## 2021-11-17 HISTORY — DX: Hypothyroidism, unspecified: E03.9

## 2021-11-17 HISTORY — DX: Acute myocardial infarction, unspecified: I21.9

## 2021-11-17 LAB — COMPREHENSIVE METABOLIC PANEL
ALT: 23 U/L (ref 0–44)
AST: 18 U/L (ref 15–41)
Albumin: 4.8 g/dL (ref 3.5–5.0)
Alkaline Phosphatase: 66 U/L (ref 38–126)
Anion gap: 8 (ref 5–15)
BUN: 22 mg/dL — ABNORMAL HIGH (ref 6–20)
CO2: 23 mmol/L (ref 22–32)
Calcium: 9.8 mg/dL (ref 8.9–10.3)
Chloride: 105 mmol/L (ref 98–111)
Creatinine, Ser: 0.98 mg/dL (ref 0.61–1.24)
GFR, Estimated: >60 mL/min (ref >60)
Glucose, Bld: 117 mg/dL — ABNORMAL HIGH (ref 70–99)
Potassium: 4 mmol/L (ref 3.5–5.1)
Sodium: 136 mmol/L (ref 135–145)
Total Bilirubin: 0.6 mg/dL (ref 0.3–1.2)
Total Protein: 7.6 g/dL (ref 6.5–8.1)

## 2021-11-17 LAB — SURGICAL PCR SCREEN
MRSA, PCR: NEGATIVE
Staphylococcus aureus: NEGATIVE

## 2021-11-17 LAB — CBC
HCT: 47.8 % (ref 39.0–52.0)
Hemoglobin: 16.1 g/dL (ref 13.0–17.0)
MCH: 28.8 pg (ref 26.0–34.0)
MCHC: 33.7 g/dL (ref 30.0–36.0)
MCV: 85.4 fL (ref 80.0–100.0)
Platelets: 271 10*3/uL (ref 150–400)
RBC: 5.6 MIL/uL (ref 4.22–5.81)
RDW: 13.7 % (ref 11.5–15.5)
WBC: 12 10*3/uL — ABNORMAL HIGH (ref 4.0–10.5)
nRBC: 0 % (ref 0.0–0.2)

## 2021-11-17 LAB — GLUCOSE, CAPILLARY: Glucose-Capillary: 148 mg/dL — ABNORMAL HIGH (ref 70–99)

## 2021-11-18 LAB — HEMOGLOBIN A1C
Hgb A1c MFr Bld: 7.3 % — ABNORMAL HIGH (ref 4.8–5.6)
Mean Plasma Glucose: 163 mg/dL

## 2021-11-20 NOTE — Telephone Encounter (Signed)
Patient medication has change so he is calling in to see if it would be the same direction. Please advise

## 2021-11-20 NOTE — Progress Notes (Signed)
Anesthesia Chart Review   Case: [id number] Date/Time: 11/28/21 1322   Procedure: TOTAL SHOULDER ARTHROPLASTY (Right: Shoulder)   Anesthesia type: Choice   Pre-op diagnosis: right djd shoulder   Location: [facility] ROOM 07 / [facility]   Surgeons: Teryl Lucy, MD       DISCUSSION:45 y.o. former smoker with h/o HTN, DM II, schizoaffective disorder, hypothyroidism, OSA on CPAP, CAD (NSTEMI s/p DES to pLAD and DES to [facility] 06/06/2020), ischemic cardiomyopathy, right shoulder djd scheduled for above procedure 11/28/2021 with Dr. Teryl Lucy.   Per cardiology, "Patient previously cleared for surgery, however, his Brilinta was recently switched to Plavix.  Per office protocol, patient may hold Plavix for 5 days prior to procedure.  It is generally advised that patient continue aspirin throughout the perioperative period."  Anticipate pt can proceed with planned procedure barring acute status change.   VS: BP 118/74   Pulse 82   Temp 36.8 C (Oral)   Resp 20   Ht 6\' 3"  (1.905 m)   Wt 117 kg   SpO2 98%   BMI 32.25 kg/m   PROVIDERS: , MD is PCP   Cardiologist - [name], MD  LABS: Labs reviewed: Acceptable for surgery. (all labs ordered are listed, but only abnormal results are displayed)  Labs Reviewed  CBC - Abnormal; Notable for the following components:      Result Value   WBC 12.0 (*)    All other components within normal limits  HEMOGLOBIN A1C - Abnormal; Notable for the following components:   Hgb A1c MFr Bld 7.3 (*)    All other components within normal limits  COMPREHENSIVE METABOLIC PANEL - Abnormal; Notable for the following components:   Glucose, Bld 117 (*)    BUN 22 (*)    All other components within normal limits  GLUCOSE, CAPILLARY - Abnormal; Notable for the following components:   Glucose-Capillary 148 (*)    All other components within normal limits  SURGICAL PCR SCREEN     IMAGES:   EKG:   CV: Echo 04/24/2021  1. Left ventricular  ejection fraction, by estimation, is 60 to 65%. The  left ventricle has normal function. The left ventricle has no regional  wall motion abnormalities. There is mild left ventricular hypertrophy.  Left ventricular diastolic parameters  were normal.   2. Right ventricular systolic function is normal. The right ventricular  size is normal. Tricuspid regurgitation signal is inadequate for assessing  PA pressure.   3. The mitral valve is normal in structure. No evidence of mitral valve  regurgitation.   4. The aortic valve was not well visualized. Aortic valve regurgitation  is not visualized. No aortic stenosis is present.   Cardiac Cath 02/24/2021 CONCLUSIONS: Normal left main Patent LAD stent with minimal luminal irregularities otherwise. 25% mid circumflex. Dominant right coronary with patent distal RCA stent.  Proximal tandem 25 and 45% stenoses.  Mild to moderate diffuse atherosclerosis noted in the right coronary. Anterior hypokinesis with EF 50%.  LVEDP 7 mmHg.   RECOMMENDATIONS:   Aggressive risk factor modification including smoking cessation, glycemic control, blood pressure control, and lipid management of LDL to less than 70. Past Medical History:  Diagnosis Date   Anxiety    Arthritis    Celiac disease    Coronary artery disease    Dairy allergy    Depression    Diabetes mellitus without complication (HCC)    High cholesterol    Hypertension    Hypothyroidism  Myocardial infarction (HCC)    Paranoia (HCC)    Schizoaffective disorder (HCC)     Past Surgical History:  Procedure Laterality Date   APPENDECTOMY     CARDIAC CATHETERIZATION     CORONARY STENT INTERVENTION N/A 06/06/2020   Procedure: CORONARY STENT INTERVENTION;  Surgeon: Swaziland, Peter M, MD;  Location: [facility] INVASIVE CV LAB;  Service: Cardiovascular;  Laterality: N/A;  prox LAD, distal RCA   INTRAVASCULAR ULTRASOUND/IVUS N/A 06/06/2020   Procedure: Intravascular Ultrasound/IVUS;  Surgeon: Swaziland,  Peter M, MD;  Location: [facility] INVASIVE CV LAB;  Service: Cardiovascular;  Laterality: N/A;   LEFT HEART CATH AND CORONARY ANGIOGRAPHY N/A 06/06/2020   Procedure: LEFT HEART CATH AND CORONARY ANGIOGRAPHY;  Surgeon: Swaziland, Peter M, MD;  Location: [facility] INVASIVE CV LAB;  Service: Cardiovascular;  Laterality: N/A;   LEFT HEART CATH AND CORONARY ANGIOGRAPHY N/A 02/24/2021   Procedure: LEFT HEART CATH AND CORONARY ANGIOGRAPHY;  Surgeon: Lyn Records, MD;  Location: [facility] INVASIVE CV LAB;  Service: Cardiovascular;  Laterality: N/A;   NASAL SEPTUM SURGERY     WRIST FRACTURE SURGERY     right wrist    MEDICATIONS:  aspirin EC 81 MG tablet   atorvastatin (LIPITOR) 80 MG tablet   Boswellia-Glucosamine-Vit D (OSTEO BI-FLEX ONE PER DAY) TABS   carvedilol (COREG) 25 MG tablet   clonazePAM (KLONOPIN) 1 MG tablet   clopidogrel (PLAVIX) 75 MG tablet   Coenzyme Q10 (CO Q-10 PO)   DULoxetine (CYMBALTA) 30 MG capsule   DULoxetine (CYMBALTA) 60 MG capsule   empagliflozin (JARDIANCE) 25 MG TABS tablet   fenofibrate (TRICOR) 145 MG tablet   gabapentin (NEURONTIN) 800 MG tablet   icosapent Ethyl (VASCEPA) 1 g capsule   insulin glargine (LANTUS SOLOSTAR) 100 UNIT/ML Solostar Pen   Insulin Pen Needle 32G X 4 MM MISC   levothyroxine (SYNTHROID) 25 MCG tablet   lithium carbonate (LITHOBID) 300 MG CR tablet   metFORMIN (GLUCOPHAGE) 1000 MG tablet   nitroGLYCERIN (NITROSTAT) 0.4 MG SL tablet   perphenazine (TRILAFON) 16 MG tablet   Pseudoephedrine HCl (SUDAFED 12 HOUR PO)   spironolactone (ALDACTONE) 25 MG tablet   traZODone (DESYREL) 100 MG tablet   TURMERIC CURCUMIN PO   Vitamin D, Ergocalciferol, (DRISDOL) 1.25 MG (50000 UNIT) CAPS capsule   No current facility-administered medications for this encounter.    Jodell Cipro Ward, PA-C [facility]-Surgical Testing [phone]

## 2021-11-22 ENCOUNTER — Telehealth: Payer: Self-pay | Admitting: Cardiology

## 2021-11-22 NOTE — Telephone Encounter (Signed)
States that they previously sent over Clearance for upcoming procedure that pt is having. Since sending Clearance pt has been changed to Plavix. Office would like to know how soon pt needs to stop Plavix prior to surgery on 11/28/21. Please advise

## 2021-11-22 NOTE — Telephone Encounter (Signed)
I s/w the pt today and went over the recommendations for holding Plavix. Pt was recently changed from Brilinta to Plavix. Pt has been made advised per pre op provider that he will need to hold Plavix x 5 full days prior to surgery 11/28/21. Pt aware that he will take his Plavix today and then begin to hold Plavix as of 11/23/21. Pt ware he will resume Plavix once felt safe per surgeon post op. Pt thanked me for the call today. I assured the pt that I will update the surgeon office as well.

## 2021-11-22 NOTE — Telephone Encounter (Signed)
   Patient Name: Matthew [name]  DOB: February 19, 1976 MRN: [id number]  Primary Cardiologist: Meriam Sprague, MD  Chart reviewed as part of pre-operative protocol coverage.   Patient previously cleared for surgery, however, his Brilinta was recently switched to Plavix.  Per office protocol, patient may hold Plavix for 5 days prior to procedure.  It is generally advised that patient continue aspirin throughout the perioperative period.  Please contact our office with any questions.  Thank you.  Joylene Grapes, NP 11/22/2021, 9:56 AM

## 2021-11-26 NOTE — H&P (Signed)
SHOULDER ARTHROPLASTY ADMISSION H&P  Patient ID: Matthew [name] MRN: [id number] DOB/AGE: February 27, 1976 46 y.o.  Chief Complaint: right shoulder pain.  Planned Procedure Date: 11/28/21 Medical Clearance by Dr. Oswald Hillock   Cardiac Clearance by Dr. Shari Prows Additional clearance by Dr. Lonzo Cloud (endo)  HPI: Matthew [name] is a 46 y.o. male who presents for evaluation of right djd shoulder. The patient has a history of pain and functional disability in the right shoulder due to trauma and has failed non-surgical conservative treatments for greater than 12 weeks to include NSAID's and/or analgesics and activity modification.  Onset of symptoms was gradual, starting >10 years ago with gradually worsening course since that time. He has a history of multiple dislocation from his late 20's with the last one occurring earlier this year.  The patient noted no past surgery on the right shoulder.  Patient currently rates pain at 7 out of 10 with activity. Patient has worsening of pain with activity and weight bearing, pain that interferes with activities of daily living, and pain with passive range of motion.  Patient has evidence of joint space narrowing by imaging studies.  There is no active infection.  Past Medical History:  Diagnosis Date   Anxiety    Arthritis    Celiac disease    Coronary artery disease    Dairy allergy    Depression    Diabetes mellitus without complication (HCC)    High cholesterol    Hypertension    Hypothyroidism    Myocardial infarction (HCC)    Paranoia (HCC)    Schizoaffective disorder (HCC)    Past Surgical History:  Procedure Laterality Date   APPENDECTOMY     CARDIAC CATHETERIZATION     CORONARY STENT INTERVENTION N/A 06/06/2020   Procedure: CORONARY STENT INTERVENTION;  Surgeon: Swaziland, Peter M, MD;  Location: [facility] INVASIVE CV [facility];  Service: Cardiovascular;  Laterality: N/A;  prox LAD, distal RCA   INTRAVASCULAR ULTRASOUND/IVUS N/A 06/06/2020   Procedure:  Intravascular Ultrasound/IVUS;  Surgeon: Swaziland, Peter M, MD;  Location: [facility] INVAS[facility] CV [facility];  Service: Cardiovascular;  Laterality: N/A;   LEFT HEART CATH AND CORONARY ANGIOGRAPHY N/A 06/06/2020   Procedure: LEFT HEART CATH AND CORONARY ANGIOGRAPHY;  Surgeon: Swaziland, Peter M, MD;  Location: [facility]VASIVE CV LAB;  Service: Cardiovascular;  Laterality: N/A;   LEFT HEART CATH AND CORONARY ANGIOGRAPHY N/A 02/24/2021   Procedure: LEFT HEART CATH AND CORONARY ANGIOGRAPHY;  Surgeon: Lyn Records, MD;  Location: [facility] INVASIVE CV LAB;  Service: Cardiovascular;  Laterality: N/A;   NASAL SEPTUM SURGERY     WRIST FRACTURE SURGERY     right wrist   Allergies  Allergen Reactions   Gluten Meal Other (See Comments)    Celiac disease   Lactose Intolerance (Gi) Diarrhea    Can tolerate hard cheese   Prior to Admission medications   Medication Sig Start Date End Date Taking? Authorizing Provider  aspirin EC 81 MG tablet Take 1 tablet (81 mg total) by mouth daily. Swallow whole. 06/07/20  Yes Meriam Sprague, MD  atorvastatin (LIPITOR) 80 MG tablet Take 1 tablet (80 mg total) by mouth every morning. 08/15/21  Yes Pemberton, Kathlynn Grate, MD  Boswellia-Glucosamine-Vit D (OSTEO BI-FLEX ONE PER DAY) TABS Take 1 tablet by mouth daily.   Yes [provider]  carvedilol (COREG) 25 MG tablet Take 1.5 tablets (37.5 mg total) by mouth 2 (two) times daily. 05/31/21  Yes Monge, Petra Kuba, NP  clonazePAM (KLONOPIN) 1 MG tablet TAKE  1 TABLET BY MOUTH THREE TIMES A DAY AS NEEDED FOR ANXIETY 09/13/21  Yes Hurst, Teresa T, PA-C  Coenzyme Q10 (CO Q-10 PO) Take 1 capsule by mouth daily.   Yes [provider]  DULoxetine (CYMBALTA) 60 MG capsule Take 1 capsule (60 mg total) by mouth daily. 09/13/21  Yes Hurst, Glade Nurse, PA-C  empagliflozin (JARDIANCE) 25 MG TABS tablet Take 1 tablet (25 mg total) by mouth daily before breakfast. 09/18/21  Yes Shamleffer, Konrad Dolores, MD  icosapent Ethyl (VASCEPA) 1 g capsule Take 2  capsules (2 g total) by mouth 2 (two) times daily. 08/18/21  Yes Meriam Sprague, MD  insulin glargine (LANTUS SOLOSTAR) 100 UNIT/ML Solostar Pen Inject 20 Units into the skin daily. 09/19/21  Yes Shamleffer, Konrad Dolores, MD  levothyroxine (SYNTHROID) 25 MCG tablet Take 1 tablet (25 mcg total) by mouth in the morning. Take with a glass of water, wait 1 hour before taking any other medications or eating. 10/02/21  Yes Melony Overly T, PA-C  metFORMIN (GLUCOPHAGE) 1000 MG tablet Take 1 tablet (1,000 mg total) by mouth 2 (two) times daily with a meal. 09/18/21  Yes Shamleffer, Konrad Dolores, MD  nitroGLYCERIN (NITROSTAT) 0.4 MG SL tablet Place 1 tablet (0.4 mg total) under the tongue every 5 (five) minutes as needed for chest pain. 08/10/20 10/13/21 Yes Georgie Chard D, NP  Pseudoephedrine HCl (SUDAFED 12 HOUR PO) Take 1 tablet by mouth daily as needed (congestion).   Yes [provider]  traZODone (DESYREL) 100 MG tablet Take 3 tablets (300 mg total) by mouth at bedtime. 09/13/21  Yes Hurst, Glade Nurse, PA-C  TURMERIC CURCUMIN PO Take 1 capsule by mouth daily.   Yes [provider]  Vitamin D, Ergocalciferol, (DRISDOL) 1.25 MG (50000 UNIT) CAPS capsule Take 50,000 Units by mouth every Sunday. 11/28/20  Yes [provider]  clopidogrel (PLAVIX) 75 MG tablet Take 1 tablet (75 mg total) by mouth daily. 11/10/21   Meriam Sprague, MD  DULoxetine (CYMBALTA) 30 MG capsule TAKE 1 CAPSULE (30 MG TOTAL) BY MOUTH DAILY. TAKE 1 WITH THE 60 MG=90 MG DAILY 11/13/21   Melony Overly T, PA-C  fenofibrate (TRICOR) 145 MG tablet Take 1 tablet (145 mg total) by mouth daily. 11/03/21   Meriam Sprague, MD  gabapentin (NEURONTIN) 800 MG tablet TAKE 1 TABLET BY MOUTH IN THE MORNING,1/2 TABLET AT NOON,1 TABLET AT 4PM AND 1 TAB AT BEDTIME 10/18/21   Hurst, Rosey Bath T, PA-C  Insulin Pen Needle 32G X 4 MM MISC 1 Device by Does not apply route daily. 08/15/20   Shamleffer, Konrad Dolores, MD  lithium  carbonate (LITHOBID) 300 MG CR tablet TAKE 2 TABLETS BY MOUTH 2 TIMES DAILY. 10/18/21   Melony Overly T, PA-C  perphenazine (TRILAFON) 16 MG tablet Take 16 mg by mouth 2 (two) times daily.    [provider]  spironolactone (ALDACTONE) 25 MG tablet Take 0.5 tablets (12.5 mg total) by mouth daily. Patient not taking: Reported on 10/13/2021 07/20/20 07/15/21  Meriam Sprague, MD   Social History   Socioeconomic History   Marital status: Single    Spouse name: Not on file   Number of children: 0   Years of education: 14   Highest education level: Associate degree: occupational, Scientist, product/process development, or vocational program  Occupational History   Occupation: [employer], unemployed  Tobacco Use   Smoking status: Some Days    Packs/day: 1.00    Types: Cigarettes, E-cigarettes    Last  attempt to quit: 10/19/2021    Years since quitting: 0.1   Smokeless tobacco: Never  Vaping Use   Vaping Use: Former  Substance and Sexual Activity   Alcohol use: No    Alcohol/week: 0.0 standard drinks of alcohol    Comment: no (02/08/15)   Drug use: No   Sexual activity: Not on file  Other Topics Concern   Not on file  Social History Narrative   Pt lives at the [facility] Determinants of Health   Financial Resource Strain: Not on file  Food Insecurity: Not on file  Transportation Needs: Not on file  Physical Activity: Not on file  Stress: Not on file  Social Connections: Not on file   Family History  Problem Relation Age of Onset   Asthma Mother    Hypertension Mother    Hypertension Father     ROS: Currently denies lightheadedness, dizziness, Fever, chills, CP, SOB.   No personal history of DVT, PE, CVA. Does have history of previous MI, is on ASA and Plavix.  No loose teeth or dentures All other systems have been reviewed and were otherwise currently negative with the exception of those mentioned in the HPI and as above.  Objective: Vitals: Ht: 6'3" Wt: 267.8 lbs Temp: 97.8 BP  129/81.  Pulse 93.  O2 94% on room air.  Physical Exam: General: Alert, NAD.   HEENT: EOMI, Good Neck Extension  Pulm: No increased work of breathing.  Clear B/L A/P w/o crackle or wheeze.  CV: RRR, No m/g/r appreciated  GI: soft, NT, ND Neuro: Neuro without gross focal deficit.  Sensation intact distally Skin: No lesions in the area of chief complaint MSK/Surgical Site: left shoulder pain with range of motion.  Forward flexion/abduction approximately 0-130.  Internal rotation to mid right buttock.  External rotation to 10 deg.  No AC pain.  No Biceps pain.  Intact Rotator cuff strength.  NVI distally.  Imaging Review Previous x-rays and MRI show evidence of posttraumatic arthrosis with intact rotator cuff.  Assessment: right shoulder posttraumatic arthrosis    Plan: Plan for Procedure(s): TOTAL SHOULDER ARTHROPLASTY  The patient history, physical exam, clinical judgement of the provider and imaging are consistent with end stage degenerative joint disease and total joint arthroplasty is deemed medically necessary. The treatment options including medical management, injection therapy, and arthroplasty were discussed at length. The risks and benefits of Procedure(s): TOTAL SHOULDER ARTHROPLASTY were presented and reviewed.  The risks of nonoperative treatment, versus surgical intervention including but not limited to continued pain, aseptic loosening, stiffness, dislocation/subluxation, infection, bleeding, nerve injury, blood clots, cardiopulmonary complications, morbidity, mortality, among others were discussed. The patient verbalizes understanding and wishes to proceed with the plan.  Patient is being admitted for surgery, OT, pain control, prophylactic antibiotics, VTE prophylaxis, progressive ambulation, ADL's and discharge planning.   Dental prophylaxis discussed and recommended for 2 years postoperatively.  The patient does  meet the criteria for TXA which will be used  perioperatively.   The patient is planning to be discharged home care of family.   Ventura Bruns, PA-C 11/26/2021 5:04 PM

## 2021-11-28 ENCOUNTER — Ambulatory Visit (HOSPITAL_COMMUNITY): Payer: Medicaid Other

## 2021-11-28 ENCOUNTER — Ambulatory Visit (HOSPITAL_BASED_OUTPATIENT_CLINIC_OR_DEPARTMENT_OTHER): Payer: Medicaid Other | Admitting: Anesthesiology

## 2021-11-28 ENCOUNTER — Other Ambulatory Visit: Payer: Self-pay

## 2021-11-28 ENCOUNTER — Ambulatory Visit (HOSPITAL_COMMUNITY): Payer: Medicaid Other | Admitting: Physician Assistant

## 2021-11-28 ENCOUNTER — Encounter (HOSPITAL_COMMUNITY): Payer: Self-pay | Admitting: Orthopedic Surgery

## 2021-11-28 ENCOUNTER — Encounter (HOSPITAL_COMMUNITY)
Admission: RE | Disposition: A | Discharge status: Home / Self Care | Payer: Self-pay | Source: Home / Self Care | Attending: Orthopedic Surgery

## 2021-11-28 ENCOUNTER — Ambulatory Visit (HOSPITAL_COMMUNITY)
Admission: RE | Admit: 2021-11-28 | Discharge: 2021-11-28 | Disposition: A | Discharge status: Home / Self Care | Payer: Medicaid Other | Attending: Orthopedic Surgery | Admitting: Orthopedic Surgery

## 2021-11-28 DIAGNOSIS — E119 Type 2 diabetes mellitus without complications: Secondary | ICD-10-CM | POA: Diagnosis not present

## 2021-11-28 DIAGNOSIS — G4730 Sleep apnea, unspecified: Secondary | ICD-10-CM | POA: Insufficient documentation

## 2021-11-28 DIAGNOSIS — I25119 Atherosclerotic heart disease of native coronary artery with unspecified angina pectoris: Secondary | ICD-10-CM

## 2021-11-28 DIAGNOSIS — F419 Anxiety disorder, unspecified: Secondary | ICD-10-CM | POA: Insufficient documentation

## 2021-11-28 DIAGNOSIS — F17210 Nicotine dependence, cigarettes, uncomplicated: Secondary | ICD-10-CM | POA: Insufficient documentation

## 2021-11-28 DIAGNOSIS — Z7984 Long term (current) use of oral hypoglycemic drugs: Secondary | ICD-10-CM | POA: Diagnosis not present

## 2021-11-28 DIAGNOSIS — Z794 Long term (current) use of insulin: Secondary | ICD-10-CM | POA: Insufficient documentation

## 2021-11-28 DIAGNOSIS — M19011 Primary osteoarthritis, right shoulder: Secondary | ICD-10-CM | POA: Insufficient documentation

## 2021-11-28 DIAGNOSIS — I2510 Atherosclerotic heart disease of native coronary artery without angina pectoris: Secondary | ICD-10-CM | POA: Insufficient documentation

## 2021-11-28 DIAGNOSIS — Z96611 Presence of right artificial shoulder joint: Secondary | ICD-10-CM

## 2021-11-28 DIAGNOSIS — I252 Old myocardial infarction: Secondary | ICD-10-CM

## 2021-11-28 DIAGNOSIS — M25311 Other instability, right shoulder: Secondary | ICD-10-CM

## 2021-11-28 DIAGNOSIS — M19111 Post-traumatic osteoarthritis, right shoulder: Secondary | ICD-10-CM

## 2021-11-28 DIAGNOSIS — M25711 Osteophyte, right shoulder: Secondary | ICD-10-CM | POA: Diagnosis not present

## 2021-11-28 DIAGNOSIS — F319 Bipolar disorder, unspecified: Secondary | ICD-10-CM | POA: Diagnosis not present

## 2021-11-28 DIAGNOSIS — E039 Hypothyroidism, unspecified: Secondary | ICD-10-CM | POA: Diagnosis not present

## 2021-11-28 DIAGNOSIS — I10 Essential (primary) hypertension: Secondary | ICD-10-CM | POA: Diagnosis not present

## 2021-11-28 DIAGNOSIS — Z955 Presence of coronary angioplasty implant and graft: Secondary | ICD-10-CM | POA: Diagnosis not present

## 2021-11-28 DIAGNOSIS — Z79899 Other long term (current) drug therapy: Secondary | ICD-10-CM | POA: Diagnosis not present

## 2021-11-28 HISTORY — PX: TOTAL SHOULDER ARTHROPLASTY: SHX126

## 2021-11-28 LAB — GLUCOSE, CAPILLARY
Glucose-Capillary: 121 mg/dL — ABNORMAL HIGH (ref 70–99)
Glucose-Capillary: 160 mg/dL — ABNORMAL HIGH (ref 70–99)

## 2021-11-28 SURGERY — ARTHROPLASTY, SHOULDER, TOTAL
Anesthesia: Regional | Site: Shoulder | Laterality: Right

## 2021-11-28 MED ORDER — PROPOFOL 10 MG/ML IV BOLUS
INTRAVENOUS | Status: AC
  Filled 2021-11-28: qty 20

## 2021-11-28 MED ORDER — SUGAMMADEX SODIUM 500 MG/5ML IV SOLN
INTRAVENOUS | Status: AC
  Filled 2021-11-28: qty 5

## 2021-11-28 MED ORDER — PHENYLEPHRINE 80 MCG/ML (10ML) SYRINGE FOR IV PUSH (FOR BLOOD PRESSURE SUPPORT)
PREFILLED_SYRINGE | INTRAVENOUS | Status: DC | PRN
  Administered 2021-11-28: 160 ug via INTRAVENOUS

## 2021-11-28 MED ORDER — PHENYLEPHRINE HCL (PRESSORS) 10 MG/ML IV SOLN: INTRAVENOUS | Status: DC | PRN

## 2021-11-28 MED ORDER — LACTATED RINGERS IV BOLUS: 250.0000 mL | Freq: Once | INTRAVENOUS | Status: DC

## 2021-11-28 MED ORDER — ACETAMINOPHEN 500 MG PO TABS: 1000.0000 mg | ORAL_TABLET | Freq: Once | ORAL | Status: DC

## 2021-11-28 MED ORDER — ROCURONIUM BROMIDE 10 MG/ML (PF) SYRINGE
PREFILLED_SYRINGE | INTRAVENOUS | Status: DC | PRN
  Administered 2021-11-28: 10 mg via INTRAVENOUS
  Administered 2021-11-28: 5 mg via INTRAVENOUS
  Administered 2021-11-28: 20 mg via INTRAVENOUS
  Administered 2021-11-28: 100 mg via INTRAVENOUS

## 2021-11-28 MED ORDER — ORAL CARE MOUTH RINSE: 15.0000 mL | Freq: Once | OROMUCOSAL | Status: AC

## 2021-11-28 MED ORDER — STERILE WATER FOR IRRIGATION IR SOLN
Status: DC | PRN
  Administered 2021-11-28: 2000 mL

## 2021-11-28 MED ORDER — FENTANYL CITRATE (PF) 100 MCG/2ML IJ SOLN
INTRAMUSCULAR | Status: AC
  Filled 2021-11-28: qty 2

## 2021-11-28 MED ORDER — EPHEDRINE SULFATE-NACL 50-0.9 MG/10ML-% IV SOSY
PREFILLED_SYRINGE | INTRAVENOUS | Status: DC | PRN
  Administered 2021-11-28 (×2): 10 mg via INTRAVENOUS

## 2021-11-28 MED ORDER — SENNA-DOCUSATE SODIUM 8.6-50 MG PO TABS: 2.0000 | ORAL_TABLET | Freq: Every day | ORAL | 1 refills | Status: DC

## 2021-11-28 MED ORDER — TRANEXAMIC ACID-NACL 1000-0.7 MG/100ML-% IV SOLN
1000.0000 mg | INTRAVENOUS | Status: AC
  Administered 2021-11-28: 1000 mg via INTRAVENOUS
  Filled 2021-11-28: qty 100

## 2021-11-28 MED ORDER — METHOCARBAMOL 500 MG IVPB - SIMPLE MED
INTRAVENOUS | Status: AC
  Filled 2021-11-28: qty 55

## 2021-11-28 MED ORDER — METHOCARBAMOL 500 MG IVPB - SIMPLE MED
500.0000 mg | Freq: Once | INTRAVENOUS | Status: AC
  Administered 2021-11-28: 500 mg via INTRAVENOUS

## 2021-11-28 MED ORDER — PHENYLEPHRINE HCL-NACL 20-0.9 MG/250ML-% IV SOLN
INTRAVENOUS | Status: DC | PRN
  Administered 2021-11-28: 30 ug/min via INTRAVENOUS

## 2021-11-28 MED ORDER — CEFAZOLIN SODIUM-DEXTROSE 2-4 GM/100ML-% IV SOLN
2.0000 g | INTRAVENOUS | Status: AC
  Administered 2021-11-28: 2 g via INTRAVENOUS
  Filled 2021-11-28: qty 100

## 2021-11-28 MED ORDER — POVIDONE-IODINE 10 % EX SWAB: 2.0000 "application " | Freq: Once | CUTANEOUS | Status: DC

## 2021-11-28 MED ORDER — PROPOFOL 10 MG/ML IV BOLUS
INTRAVENOUS | Status: DC | PRN
  Administered 2021-11-28: 50 mg via INTRAVENOUS
  Administered 2021-11-28: 200 mg via INTRAVENOUS

## 2021-11-28 MED ORDER — PHENYLEPHRINE 80 MCG/ML (10ML) SYRINGE FOR IV PUSH (FOR BLOOD PRESSURE SUPPORT)
PREFILLED_SYRINGE | INTRAVENOUS | Status: AC
  Filled 2021-11-28: qty 10

## 2021-11-28 MED ORDER — KETOROLAC TROMETHAMINE 30 MG/ML IJ SOLN: 30.0000 mg | Freq: Once | INTRAMUSCULAR | Status: DC | PRN

## 2021-11-28 MED ORDER — ONDANSETRON HCL 4 MG/2ML IJ SOLN
INTRAMUSCULAR | Status: AC
  Filled 2021-11-28: qty 2

## 2021-11-28 MED ORDER — POVIDONE-IODINE 10 % EX SWAB: Freq: Once | CUTANEOUS | Status: AC

## 2021-11-28 MED ORDER — AMISULPRIDE (ANTIEMETIC) 5 MG/2ML IV SOLN: 10.0000 mg | Freq: Once | INTRAVENOUS | Status: DC | PRN

## 2021-11-28 MED ORDER — BUPIVACAINE HCL (PF) 0.25 % IJ SOLN
INTRAMUSCULAR | Status: AC
Start: 2021-11-28 — End: ?
  Filled 2021-11-28: qty 30

## 2021-11-28 MED ORDER — HYDROCODONE-ACETAMINOPHEN 10-325 MG PO TABS
1.0000 | ORAL_TABLET | ORAL | 0 refills | Status: DC | PRN
Start: 2021-11-28 — End: 2022-03-19

## 2021-11-28 MED ORDER — 0.9 % SODIUM CHLORIDE (POUR BTL) OPTIME
TOPICAL | Status: DC | PRN
  Administered 2021-11-28: 1000 mL

## 2021-11-28 MED ORDER — DEXAMETHASONE SODIUM PHOSPHATE 10 MG/ML IJ SOLN
INTRAMUSCULAR | Status: AC
Start: 2021-11-28 — End: ?
  Filled 2021-11-28: qty 1

## 2021-11-28 MED ORDER — OXYCODONE HCL 5 MG/5ML PO SOLN: 5.0000 mg | Freq: Once | ORAL | Status: DC | PRN

## 2021-11-28 MED ORDER — ONDANSETRON HCL 4 MG PO TABS: 4.0000 mg | ORAL_TABLET | Freq: Three times a day (TID) | ORAL | 0 refills | Status: DC | PRN

## 2021-11-28 MED ORDER — LACTATED RINGERS IV BOLUS
500.0000 mL | Freq: Once | INTRAVENOUS | Status: AC
  Administered 2021-11-28: 500 mL via INTRAVENOUS

## 2021-11-28 MED ORDER — LACTATED RINGERS IV SOLN: INTRAVENOUS | Status: DC

## 2021-11-28 MED ORDER — ONDANSETRON HCL 4 MG/2ML IJ SOLN
INTRAMUSCULAR | Status: DC | PRN
  Administered 2021-11-28: 4 mg via INTRAVENOUS

## 2021-11-28 MED ORDER — CHLORHEXIDINE GLUCONATE 0.12 % MT SOLN
15.0000 mL | Freq: Once | OROMUCOSAL | Status: AC
  Administered 2021-11-28: 15 mL via OROMUCOSAL

## 2021-11-28 MED ORDER — ACETAMINOPHEN 500 MG PO TABS
1000.0000 mg | ORAL_TABLET | Freq: Once | ORAL | Status: AC
Start: 2021-11-28 — End: 2021-11-28
  Administered 2021-11-28: 1000 mg via ORAL
  Filled 2021-11-28: qty 2

## 2021-11-28 MED ORDER — LIDOCAINE 2% (20 MG/ML) 5 ML SYRINGE
INTRAMUSCULAR | Status: DC | PRN
  Administered 2021-11-28: 40 mg via INTRAVENOUS

## 2021-11-28 MED ORDER — DEXAMETHASONE SODIUM PHOSPHATE 10 MG/ML IJ SOLN
INTRAMUSCULAR | Status: DC | PRN
  Administered 2021-11-28: 10 mg via INTRAVENOUS

## 2021-11-28 MED ORDER — ROCURONIUM BROMIDE 10 MG/ML (PF) SYRINGE
PREFILLED_SYRINGE | INTRAVENOUS | Status: AC
  Filled 2021-11-28: qty 10

## 2021-11-28 MED ORDER — SUGAMMADEX SODIUM 500 MG/5ML IV SOLN
INTRAVENOUS | Status: DC | PRN
  Administered 2021-11-28: 250 mg via INTRAVENOUS

## 2021-11-28 MED ORDER — DEXMEDETOMIDINE (PRECEDEX) IN NS 20 MCG/5ML (4 MCG/ML) IV SYRINGE
PREFILLED_SYRINGE | INTRAVENOUS | Status: DC | PRN
  Administered 2021-11-28: 12 ug via INTRAVENOUS
  Administered 2021-11-28: 8 ug via INTRAVENOUS

## 2021-11-28 MED ORDER — FENTANYL CITRATE (PF) 100 MCG/2ML IJ SOLN
INTRAMUSCULAR | Status: DC | PRN
Start: 2021-11-28 — End: 2021-11-28
  Administered 2021-11-28: 100 ug via INTRAVENOUS
  Administered 2021-11-28: 50 ug via INTRAVENOUS

## 2021-11-28 MED ORDER — ONDANSETRON HCL 4 MG/2ML IJ SOLN: 4.0000 mg | Freq: Once | INTRAMUSCULAR | Status: DC | PRN

## 2021-11-28 MED ORDER — MIDAZOLAM HCL 2 MG/2ML IJ SOLN
1.0000 mg | INTRAMUSCULAR | Status: DC
  Administered 2021-11-28: 2 mg via INTRAVENOUS
  Filled 2021-11-28: qty 2

## 2021-11-28 MED ORDER — ROPIVACAINE HCL 5 MG/ML IJ SOLN
INTRAMUSCULAR | Status: DC | PRN
  Administered 2021-11-28: 15 mL via PERINEURAL

## 2021-11-28 MED ORDER — PHENYLEPHRINE HCL-NACL 20-0.9 MG/250ML-% IV SOLN
INTRAVENOUS | Status: AC
  Filled 2021-11-28: qty 250

## 2021-11-28 MED ORDER — DEXMEDETOMIDINE HCL IN NACL 80 MCG/20ML IV SOLN
INTRAVENOUS | Status: AC
  Filled 2021-11-28: qty 20

## 2021-11-28 MED ORDER — FENTANYL CITRATE PF 50 MCG/ML IJ SOSY
50.0000 ug | PREFILLED_SYRINGE | INTRAMUSCULAR | Status: DC
  Administered 2021-11-28: 100 ug via INTRAVENOUS
  Filled 2021-11-28: qty 2

## 2021-11-28 MED ORDER — SODIUM CHLORIDE 0.9 % IR SOLN
Status: DC | PRN
  Administered 2021-11-28: 1000 mL

## 2021-11-28 MED ORDER — EPHEDRINE 5 MG/ML INJ
INTRAVENOUS | Status: AC
  Filled 2021-11-28: qty 5

## 2021-11-28 MED ORDER — OXYCODONE HCL 5 MG PO TABS: 5.0000 mg | ORAL_TABLET | Freq: Once | ORAL | Status: DC | PRN

## 2021-11-28 MED ORDER — HYDROMORPHONE HCL 1 MG/ML IJ SOLN: 0.2500 mg | INTRAMUSCULAR | Status: DC | PRN

## 2021-11-28 MED ORDER — BUPIVACAINE LIPOSOME 1.3 % IJ SUSP
INTRAMUSCULAR | Status: DC | PRN
  Administered 2021-11-28: 10 mL via PERINEURAL

## 2021-11-28 SURGICAL SUPPLY — 65 items
BAG COUNTER SPONGE SURGICOUNT (BAG) ×1 IMPLANT
BAG ZIPLOCK 12X15 (MISCELLANEOUS) ×2 IMPLANT
BLADE SAW SGTL 73X25 THK (BLADE) ×2 IMPLANT
CEMENT BONE R 1X40 (Cement) ×2 IMPLANT
CLSR STERI-STRIP ANTIMIC 1/2X4 (GAUZE/BANDAGES/DRESSINGS) ×2 IMPLANT
COOLER ICEMAN CLASSIC (MISCELLANEOUS) ×2 IMPLANT
COVER BACK TABLE 60X90IN (DRAPES) ×2 IMPLANT
COVER MAYO STAND STRL (DRAPES) ×2 IMPLANT
COVER SURGICAL LIGHT HANDLE (MISCELLANEOUS) ×2 IMPLANT
DRAPE POUCH INSTRU U-SHP 10X18 (DRAPES) ×2 IMPLANT
DRAPE SHEET LG 3/4 BI-LAMINATE (DRAPES) ×4 IMPLANT
DRAPE SURG 17X11 SM STRL (DRAPES) ×2 IMPLANT
DRAPE U-SHAPE 47X51 STRL (DRAPES) ×2 IMPLANT
DRSG MEPILEX BORDER 4X8 (GAUZE/BANDAGES/DRESSINGS) ×2 IMPLANT
DRSG MEPILEX POST OP 4X12 (GAUZE/BANDAGES/DRESSINGS) ×1 IMPLANT
DURAPREP 26ML APPLICATOR (WOUND CARE) ×2 IMPLANT
ELECT REM PT RETURN 15FT ADLT (MISCELLANEOUS) ×2 IMPLANT
FACESHIELD WRAPAROUND (MASK) IMPLANT
FACESHIELD WRAPAROUND OR TEAM (MASK) IMPLANT
GLENOID MOD PE 3 PEG SZ 4 (Shoulder) ×1 IMPLANT
GLENOID MOD POST TM SZ2 (Post) ×1 IMPLANT
GLOVE BIO SURGEON STRL SZ 6.5 (GLOVE) ×2 IMPLANT
GLOVE BIO SURGEON STRL SZ7.5 (GLOVE) ×2 IMPLANT
GLOVE BIO SURGEON STRL SZ8 (GLOVE) ×2 IMPLANT
GLOVE BIOGEL PI IND STRL 7.0 (GLOVE) ×1 IMPLANT
GLOVE BIOGEL PI IND STRL 8 (GLOVE) ×2 IMPLANT
GLOVE BIOGEL PI INDICATOR 7.0 (GLOVE) ×1
GLOVE BIOGEL PI INDICATOR 8 (GLOVE) ×2
GOWN STRL REIN XL XLG (GOWN DISPOSABLE) ×4 IMPLANT
HANDPIECE INTERPULSE COAX TIP (DISPOSABLE) ×2
HEAD HUMERAL COMP STD (Orthopedic Implant) IMPLANT
HEAD MODULAR W VARIABLE OFFSET (Orthopedic Implant) IMPLANT
HOOD PEEL AWAY FLYTE STAYCOOL (MISCELLANEOUS) ×2 IMPLANT
HUMERAL HEAD COMP STD (Orthopedic Implant) ×2 IMPLANT
KIT BASIN OR (CUSTOM PROCEDURE TRAY) ×2 IMPLANT
KIT TURNOVER KIT A (KITS) ×1 IMPLANT
MODULAR HEAD W VARIABLE OFFSET (Orthopedic Implant) ×2 IMPLANT
NS IRRIG 1000ML POUR BTL (IV SOLUTION) ×2 IMPLANT
PACK SHOULDER (CUSTOM PROCEDURE TRAY) ×2 IMPLANT
PAD COLD SHLDR WRAP-ON (PAD) ×2 IMPLANT
PIN STEINMANN THREADED TIP (PIN) ×1 IMPLANT
PIN THREADED REVERSE (PIN) ×1 IMPLANT
PROTECTOR NERVE ULNAR (MISCELLANEOUS) ×2 IMPLANT
RESTRAINT HEAD UNIVERSAL NS (MISCELLANEOUS) ×2 IMPLANT
SET HNDPC FAN SPRY TIP SCT (DISPOSABLE) ×1 IMPLANT
SLING ARM FOAM STRAP LRG (SOFTGOODS) ×1 IMPLANT
SLING ARM IMMOBILIZER LRG (SOFTGOODS) ×2 IMPLANT
SMARTMIX MINI TOWER (MISCELLANEOUS) ×2
SPIKE FLUID TRANSFER (MISCELLANEOUS) IMPLANT
SPONGE T-LAP 4X18 ~~LOC~~+RFID (SPONGE) ×2 IMPLANT
STEM HUMERAL STRL 11MMX55MM (Stem) ×1 IMPLANT
STRIP CLOSURE SKIN 1/2X4 (GAUZE/BANDAGES/DRESSINGS) ×1 IMPLANT
SUCTION FRAZIER HANDLE 12FR (TUBING) ×2
SUCTION TUBE FRAZIER 12FR DISP (TUBING) ×1 IMPLANT
SUPPORT WRAP ARM LG (MISCELLANEOUS) ×2 IMPLANT
SUT MAXBRAID #2 CVD NDL (SUTURE) ×1 IMPLANT
SUT MAXBRAID #5 CCS-NDL 2PK (SUTURE) ×2 IMPLANT
SUT VIC AB 1 CT1 36 (SUTURE) ×2 IMPLANT
SUT VIC AB 2-0 CT1 27 (SUTURE) ×2
SUT VIC AB 2-0 CT1 TAPERPNT 27 (SUTURE) ×1 IMPLANT
SUT VIC AB 3-0 SH 8-18 (SUTURE) ×2 IMPLANT
TOWEL OR 17X26 10 PK STRL BLUE (TOWEL DISPOSABLE) ×2 IMPLANT
TOWEL OR NON WOVEN STRL DISP B (DISPOSABLE) ×2 IMPLANT
TOWER SMARTMIX MINI (MISCELLANEOUS) ×1 IMPLANT
WATER STERILE IRR 1000ML POUR (IV SOLUTION) ×4 IMPLANT

## 2021-11-28 NOTE — Discharge Instructions (Signed)

## 2021-11-28 NOTE — Interval H&P Note (Signed)
History and Physical Interval Note:  11/28/2021 12:25 PM  Matthew [name]  has presented today for surgery, with the diagnosis of right djd shoulder.  The various methods of treatment have been discussed with the patient and family. After consideration of risks, benefits and other options for treatment, the patient has consented to  Procedure(s): TOTAL SHOULDER ARTHROPLASTY (Right) as a surgical intervention.  The patient's history has been reviewed, patient examined, no change in status, stable for surgery.  I have reviewed the patient's chart and labs.  Questions were answered to the patient's satisfaction.     Eulas Post

## 2021-11-28 NOTE — Anesthesia Procedure Notes (Signed)
Anesthesia Regional Block: Interscalene brachial plexus block   Pre-Anesthetic Checklist: , timeout performed,  Correct Patient, Correct Site, Correct Laterality,  Correct Procedure, Correct Position, site marked,  Risks and benefits discussed,  Surgical consent,  Pre-op evaluation,  At surgeon's request and post-op pain management  Laterality: Right  Prep: Maximum Sterile Barrier Precautions used, chloraprep       Needles:  Injection technique: Single-shot  Needle Type: Echogenic Stimulator Needle     Needle Length: 9cm  Needle Gauge: 22     Additional Needles:   Procedures:,,,, ultrasound used (permanent image in chart),,    Narrative:  Start time: 11/28/2021 12:55 PM End time: 11/28/2021 1:00 PM Injection made incrementally with aspirations every 5 mL.  Performed by: Personally  Anesthesiologist: Lannie Fields, DO  Additional Notes: Monitors applied. No increased pain on injection. No increased resistance to injection. Injection made in 5cc increments. Good needle visualization. Patient tolerated procedure well.

## 2021-11-28 NOTE — Op Note (Signed)
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  ANESTHESIA:   General with an interscalene block  ESTIMATED BLOOD LOSS: 150 mL  UNIQUE ASPECTS OF THE CASE: He was quite muscular and access was fairly difficult.  I was barely able to externally rotate the shoulder enough, but I was ultimately able to achieve positioning.  I had a moderate size bleeder from the sisters at the bottom of the subscapularis, but I was ultimately able to get control.  I had to cut the head twice, fairly aggressively in order to get adequate mobilization and access to instrument the glenoid.  I used an inline glenoid construct, because his bone was extremely strong, and I knew that I would have excellent fixation, and additionally it was difficult to get posterior enough to place a posterior peg.  On my subscapularis repair I actually placed four #5 max braid through the bone, and then also had an additional #2 max braid for the rotator interval.  On the reaming of the glenoid, I reamed down the face anteriorly and got flush posteriorly.  I had just a small amount of cancellous bone anteriorly, but excellent cortical support.  PREOPERATIVE INDICATIONS:  Matthew [name] is a  46 y.o. male who failed conservative measures and elected for surgical management.    The risks benefits and alternatives were discussed with the patient preoperatively including but not limited to the risks of infection, bleeding, nerve injury, cardiopulmonary complications, the need for revision surgery, dislocation, loosening, incomplete relief  of pain, among others, and the patient was willing to proceed.   OPERATIVE IMPLANTS: Biomet size 11 micro press-fit humeral stem, size 46+18 versa-dial humeral head, set in the ED position with increased coverage posteriorly, with a 4 cemented glenoid polyethylene 2 peg in line implant with a central regenerex noncemented post.   OPERATIVE FINDINGS: Advanced glenohumeral osteoarthritis involving the glenoid and the humeral head with substantial osteophyte formation inferiorly.  The rotator cuff appeared intact.   OPERATIVE PROCEDURE: The patient was brought to the operating room and placed in the supine position. General anesthesia was administered. IV antibiotics were given.  The upper extremity was prepped and draped in usual sterile fashion. The patient was in a beachchair position with all bony prominences padded.   Time out was performed and a deltopectoral approach was carried out. The biceps tendon was tenodesed to the pectoralis tendon. The subscapularis was released, tagging it with a #2 max braid, leaving a cuff of tendon for repair.   The inferior osteophyte was removed, and release of the capsule off of the humeral side was completed. The head was dislocated, and I reamed sequentially. I placed the humeral cutting guide at 30 of retroversion, and then pinned this into place, and made my humeral neck cut. This was at the appropriate level.   I then placed deep retractors and exposed the glenoid. I excised the labrum circumferentially, taking care to protect the axillary nerve inferiorly.   I then placed a guidewire into the center position, controlling appropriate version and inclination. I then reamed over the guidewire with the small reamer, and was satisfied with the preparation. I  preserved the subchondral bone in order to maximize the strength and minimize the risk for subsequent subsidence.   I then drilled the central hole for the regenerex peg, and then placed the guide, and then  drilled the 2 peripheral peg holes. I had excellent bony circumferential contact.   I then cleaned the glenoid, irrigated it copiously, and then dried it and cemented the prosthesis into place. Excellent seating was achieved. I had full exposure. The cement cured while I turned my attention to the humeral side.   I sequentially broached, up to the selected size, with the broach set at 30 of retroversion. I placed 4 #5 max braid through the bone for subsequent repair.  I then placed the real stem. I trialed with multiple heads, and the above-named component was selected. Increased posterior coverage improved the coverage. The soft tissue tension was appropriate.   I then impacted the real humeral head into place, reduced the head, and irrigated copiously. Excellent stability and range of motion was achieved. I repaired the subscapularis with 4 #5 max braid through the bone in a figure-of-eight type fashion, and then 1 additional #2 max braid for the rotator interval.  Excellent repair achieved and I irrigated copiously once more. The subcutaneous tissue was closed with Vicryl including the deltopectoral fascia.   The skin was closed with Steri-Strips and sterile gauze was applied. He had a preoperative nerve block. He tolerated the procedure well and there were no complications.

## 2021-11-28 NOTE — Anesthesia Preprocedure Evaluation (Addendum)
Anesthesia Evaluation  Patient identified by MRN, date of birth, ID band Patient awake    Reviewed: Allergy & Precautions, NPO status , Patient's Chart, lab work & pertinent test results, reviewed documented beta blocker date and time   Airway Mallampati: III  TM Distance: >3 FB Neck ROM: Full    Dental  (+) Teeth Intact, Dental Advisory Given   Pulmonary sleep apnea (noncompliant w/ CPAP) , Current Smoker,  1-2cigg/d, down from 2.5ppd No inhalers   Pulmonary exam normal breath sounds clear to auscultation       Cardiovascular hypertension (138/87 in preop), Pt. on medications and Pt. on home beta blockers + angina (has been checked out multiple times, deemed to be anxiety per pt) with exertion + CAD, + Past MI and + Cardiac Stents (05/2020)  Normal cardiovascular exam Rhythm:Regular Rate:Normal  Echo 04/2021 1. Left ventricular ejection fraction, by estimation, is 60 to 65%. The  left ventricle has normal function. The left ventricle has no regional  wall motion abnormalities. There is mild left ventricular hypertrophy.  Left ventricular diastolic parameters  were normal.  2. Right ventricular systolic function is normal. The right ventricular  size is normal. Tricuspid regurgitation signal is inadequate for assessing  PA pressure.  3. The mitral valve is normal in structure. No evidence of mitral valve regurgitation.  4. The aortic valve was not well visualized. Aortic valve regurgitation is not visualized. No aortic stenosis is present.   Cath 02/2021 ? Normal left main ? Patent LAD stent with minimal luminal irregularities otherwise. ? 25% mid circumflex. ? Dominant right coronary with patent distal RCA stent.  Proximal tandem 25 and 45% stenoses.  Mild to moderate diffuse atherosclerosis noted in the right coronary. ? Anterior hypokinesis with EF 50%.  LVEDP 7 mmHg.    Neuro/Psych PSYCHIATRIC DISORDERS Anxiety  Depression Bipolar Disorder Schizophrenia negative neurological ROS     GI/Hepatic negative GI ROS, Neg liver ROS,   Endo/Other  diabetes, Well Controlled, Type 2, Oral Hypoglycemic Agents, Insulin DependentHypothyroidism BMI 33 a1c 7.3 FS 121  Renal/GU negative Renal ROS  negative genitourinary   Musculoskeletal  (+) Arthritis , Osteoarthritis,    Abdominal (+) + obese,   Peds  Hematology negative hematology ROS (+) Hb 16.1   Anesthesia Other Findings   Reproductive/Obstetrics negative OB ROS                            Anesthesia Physical Anesthesia Plan  ASA: 3  Anesthesia Plan: General and Regional   Post-op Pain Management: Tylenol PO (pre-op)* and Regional block*   Induction: Intravenous  PONV Risk Score and Plan: Ondansetron, Dexamethasone, Midazolam and Treatment may vary due to age or medical condition  Airway Management Planned: Oral ETT  Additional Equipment: None  Intra-op Plan:   Post-operative Plan: Extubation in OR  Informed Consent: I have reviewed the patients History and Physical, chart, labs and discussed the procedure including the risks, benefits and alternatives for the proposed anesthesia with the patient or authorized representative who has indicated his/her understanding and acceptance.     Dental advisory given  Plan Discussed with: CRNA  Anesthesia Plan Comments:        Anesthesia Quick Evaluation

## 2021-11-28 NOTE — Anesthesia Procedure Notes (Signed)
Procedure Name: Intubation Date/Time: 11/28/2021 1:41 PM  Performed by: Florene Route, CRNAPre-anesthesia Checklist: Patient identified, Emergency Drugs available, Suction available and Patient being monitored Patient Re-evaluated:Patient Re-evaluated prior to induction Oxygen Delivery Method: Circle system utilized Preoxygenation: Pre-oxygenation with 100% oxygen Induction Type: IV induction Ventilation: Oral airway inserted - appropriate to patient size and Two handed mask ventilation required Laryngoscope Size: Miller and 3 Grade View: Grade I Tube type: Oral Tube size: 8.0 mm Number of attempts: 1 Airway Equipment and Method: Stylet and Oral airway Placement Confirmation: ETT inserted through vocal cords under direct vision, positive ETCO2 and breath sounds checked- equal and bilateral Secured at: 23 cm Tube secured with: Tape Dental Injury: Teeth and Oropharynx as per pre-operative assessment  Comments: Two handed mask ventilation and oral airway, with Anesthesiologist bagging, due to patient's facial hair.

## 2021-11-28 NOTE — Evaluation (Signed)
Occupational Therapy Evaluation Patient Details Name: Matthew [name] MRN: [id number] DOB: 1975/08/11 Today's Date: 11/28/2021   History of Present Illness patient is a 46 year old male who was admitted for a right total shoulder arthroplasty. PMH:DM, depression, celiacs disease   Clinical Impression   Patient is a 46 year old male who was seen pre op for R TSA. Patient was educated verbally and visually on RUE restrictions post operatively. Therapist provided education and instruction to patient in regards to exercises, precautions, positioning, donning upper extremity clothing and bathing while maintaining shoulder precautions, ice and edema management and donning/doffing sling. Patient verbalized understanding and demonstrated as needed. Patient to follow up with MD for further therapy needs.       Recommendations for follow up therapy are one component of a multi-disciplinary discharge planning process, led by the attending physician.  Recommendations may be updated based on patient status, additional functional criteria and insurance authorization.   Follow Up Recommendations  Follow physician's recommendations for discharge plan and follow up therapies                   Precautions / Restrictions Precautions Precautions: Shoulder Type of Shoulder Precautions: NO ROM of shoulder, OK for hand wrist and elbow Shoulder Interventions: Shoulder sling/immobilizer;Off for dressing/bathing/exercises;At all times Precaution Booklet Issued: Yes (comment) (handout) Required Braces or Orthoses: Sling Restrictions Weight Bearing Restrictions: Yes RUE Weight Bearing: Non weight bearing          Pertinent Vitals/Pain Pain Assessment Pain Assessment: No/denies pain     Hand Dominance     Extremity/Trunk Assessment Upper Extremity Assessment Upper Extremity Assessment: RUE deficits/detail RUE Deficits / Details: patient was able to lift arm pre operatively. not formally assessed            Communication     Cognition Arousal/Alertness: Awake/alert Behavior During Therapy: WFL for tasks assessed/performed Overall Cognitive Status: Within Functional Limits for tasks assessed                 General Comments  Patient was seen Pre op in strecher bed. education was provided verbally and with demonstration. patient verbalized understanding.    Exercises     Shoulder Instructions Shoulder Instructions Donning/doffing shirt without moving shoulder: Patient able to independently direct caregiver Method for sponge bathing under operated UE: Patient able to independently direct caregiver Donning/doffing sling/immobilizer: Patient able to independently direct caregiver Correct positioning of sling/immobilizer: Patient able to independently direct caregiver ROM for elbow, wrist and digits of operated UE: Patient able to independently direct caregiver Sling wearing schedule (on at all times/off for ADL's): Patient able to independently direct caregiver Proper positioning of operated UE when showering: Patient able to independently direct caregiver Positioning of UE while sleeping: Patient able to independently direct caregiver    Home Living Family/patient expects to be discharged to:: Private residence Living Arrangements: Alone               Additional Comments: plans to d/c home with family after surgery      Prior Functioning/Environment Prior Level of Function : Independent/Modified Independent                        OT Problem List: Pain      OT Treatment/Interventions:      OT Goals(Current goals can be found in the care plan section) Acute Rehab OT Goals OT Goal Formulation: All assessment and education complete, DC therapy  OT Frequency:  End of Session    Activity Tolerance: Patient tolerated treatment well Patient left: in bed;with call bell/phone within reach  OT Visit Diagnosis: Pain Pain - Right/Left:  Right Pain - part of body: Shoulder                Time: 1207-1219 OT Time Calculation (min): 12 min Charges:  OT General Charges $OT Visit: 1 Visit OT Evaluation $OT Eval Low Complexity: 1 Low  Viann Shove, MS Acute Rehabilitation Department Office# [phone] Pager# [phone]   Ardyth Harps 11/28/2021, 12:33 PM

## 2021-11-28 NOTE — Transfer of Care (Signed)
Immediate Anesthesia Transfer of Care Note  Patient: Matthew [name]  Procedure(s) Performed: TOTAL SHOULDER ARTHROPLASTY (Right: Shoulder)  Patient Location: PACU  Anesthesia Type:General  Level of Consciousness: awake, alert  and oriented  Airway & Oxygen Therapy: Patient Spontanous Breathing and Patient connected to face mask oxygen  Post-op Assessment: Report given to RN and Post -op Vital signs reviewed and stable  Post vital signs: Reviewed and stable  Last Vitals:  Vitals Value Taken Time  BP 143/82 11/28/21 1653  Temp    Pulse 95 11/28/21 1655  Resp 20 11/28/21 1655  SpO2 97 % 11/28/21 1655  Vitals shown include unvalidated device data.  Last Pain:  Vitals:   11/28/21 1310  TempSrc:   PainSc: 0-No pain      Patients Stated Pain Goal: 5 (11/28/21 1142)  Complications: No notable events documented.

## 2021-11-29 NOTE — Anesthesia Postprocedure Evaluation (Signed)
Anesthesia Post Note  Patient: Matthew [name]  Procedure(s) Performed: TOTAL SHOULDER ARTHROPLASTY (Right: Shoulder)     Patient location during evaluation: PACU Anesthesia Type: Regional and General Level of consciousness: awake and alert, oriented and patient cooperative Pain management: pain level controlled Vital Signs Assessment: post-procedure vital signs reviewed and stable Respiratory status: spontaneous breathing, nonlabored ventilation and respiratory function stable Cardiovascular status: blood pressure returned to baseline and stable Postop Assessment: no apparent nausea or vomiting Anesthetic complications: no   No notable events documented.  Last Vitals:  Vitals:   11/28/21 1745 11/28/21 1800  BP: 132/72 (!) 145/76  Pulse: 93 90  Resp: 18 18  Temp:    SpO2: 93% 96%    Last Pain:  Vitals:   11/28/21 1800  TempSrc:   PainSc: 4                  Lannie Fields

## 2021-11-30 ENCOUNTER — Encounter (HOSPITAL_COMMUNITY): Payer: Self-pay | Admitting: Orthopedic Surgery

## 2021-12-03 ENCOUNTER — Other Ambulatory Visit: Payer: Self-pay | Admitting: Physician Assistant

## 2021-12-13 ENCOUNTER — Encounter: Payer: Self-pay | Admitting: Physician Assistant

## 2021-12-13 ENCOUNTER — Other Ambulatory Visit: Payer: Self-pay | Admitting: Physician Assistant

## 2021-12-13 ENCOUNTER — Ambulatory Visit (INDEPENDENT_AMBULATORY_CARE_PROVIDER_SITE_OTHER): Payer: Self-pay | Admitting: Physician Assistant

## 2021-12-13 DIAGNOSIS — F250 Schizoaffective disorder, bipolar type: Secondary | ICD-10-CM

## 2021-12-13 DIAGNOSIS — F411 Generalized anxiety disorder: Secondary | ICD-10-CM

## 2021-12-13 DIAGNOSIS — E032 Hypothyroidism due to medicaments and other exogenous substances: Secondary | ICD-10-CM

## 2021-12-13 DIAGNOSIS — F99 Mental disorder, not otherwise specified: Secondary | ICD-10-CM

## 2021-12-13 DIAGNOSIS — F5105 Insomnia due to other mental disorder: Secondary | ICD-10-CM

## 2021-12-13 MED ORDER — DULOXETINE HCL 30 MG PO CPEP
30.0000 mg | ORAL_CAPSULE | Freq: Every day | ORAL | 0 refills | Status: DC
Start: 2021-12-13 — End: 2022-03-21

## 2021-12-13 MED ORDER — DULOXETINE HCL 60 MG PO CPEP: 60.0000 mg | ORAL_CAPSULE | Freq: Every day | ORAL | 0 refills | Status: DC

## 2021-12-13 MED ORDER — CLONAZEPAM 1 MG PO TABS
1.0000 mg | ORAL_TABLET | Freq: Two times a day (BID) | ORAL | 2 refills | Status: DC | PRN
Start: 2021-12-13 — End: 2022-03-09

## 2021-12-13 MED ORDER — TRAZODONE HCL 100 MG PO TABS: 300.0000 mg | ORAL_TABLET | Freq: Every day | ORAL | 0 refills | Status: DC

## 2021-12-13 NOTE — Progress Notes (Signed)
[facility]  Patient ID: Matthew [name],  MRN: [id number]  PCP: [name], MD  Date of Evaluation: 12/13/2021  time spent:30 minutes  Chief Complaint:  Chief Complaint   Anxiety; Depression; Insomnia; Follow-up     HISTORY/CURRENT STATUS: For routine f/u.   States he's stable. Patient is able to enjoy things.  Energy and motivation are fair to good. Can't do much b/c recovering from surgery.   No extreme sadness, tearfulness, or feelings of hopelessness.  Sleeps well most of the time. ADLs and personal hygiene are normal.   Denies any changes in concentration, making decisions, or remembering things.  Appetite has not changed.  Weight is stable.  Has GAD but no PA often. Klonopin works. Denies suicidal or homicidal thoughts.  Patient denies increased energy with decreased need for sleep, increased talkativeness, racing thoughts, impulsivity or risky behaviors, increased spending, increased libido, grandiosity, increased irritability or anger, paranoia, and no hallucinations.  Denies dizziness, syncope, seizures, numbness, tingling, tremor, tics, unsteady gait, slurred speech, confusion.  Complains of right shoulder pain, is only a couple of weeks postop.  No abnormal muscle movements.   Individual Medical History/ Review of Systems: Changes? :Yes    right shoulder arthroplasty 11/28/2021.  Past medications for mental health diagnoses include: Risperdal, Zyprexa, Abilify, [name], Klonopin, Gabapentin, Xanax, Valium, Ativan, Hydroxyzine, Trilafon, Atenolol, Cymbalta, Prozac, Lexapro, Paxil, Lithium, Adderall, Ritalin, Mirtazepine, Sonata was ineffective.  Allergies: Gluten meal and Lactose intolerance (gi)  Current Medications:  Current Outpatient Medications:    aspirin EC 81 MG tablet, Take 1 tablet (81 mg total) by mouth daily. Swallow whole., Disp: 90 tablet, Rfl: 3   atorvastatin (LIPITOR) 80 MG tablet, Take 1 tablet (80 mg total) by mouth every morning., Disp:  90 tablet, Rfl: 3   Boswellia-Glucosamine-Vit D (OSTEO BI-FLEX ONE PER DAY) TABS, Take 1 tablet by mouth daily., Disp: , Rfl:    carvedilol (COREG) 25 MG tablet, Take 1.5 tablets (37.5 mg total) by mouth 2 (two) times daily., Disp: 270 tablet, Rfl: 3   clopidogrel (PLAVIX) 75 MG tablet, Take 1 tablet (75 mg total) by mouth daily., Disp: 90 tablet, Rfl: 3   Coenzyme Q10 (CO Q-10 PO), Take 1 capsule by mouth daily., Disp: , Rfl:    DULoxetine (CYMBALTA) 60 MG capsule, Take 1 capsule (60 mg total) by mouth daily. Take with 30 mg=90 mg daily., Disp: 90 capsule, Rfl: 0   empagliflozin (JARDIANCE) 25 MG TABS tablet, Take 1 tablet (25 mg total) by mouth daily before breakfast., Disp: 90 tablet, Rfl: 3   fenofibrate (TRICOR) 145 MG tablet, Take 1 tablet (145 mg total) by mouth daily., Disp: 90 tablet, Rfl: 3   gabapentin (NEURONTIN) 800 MG tablet, TAKE 1 TABLET BY MOUTH IN THE MORNING,1/2 TABLET AT NOON,1 TABLET AT 4PM AND 1 TAB AT BEDTIME, Disp: 315 tablet, Rfl: 0   icosapent Ethyl (VASCEPA) 1 g capsule, Take 2 capsules (2 g total) by mouth 2 (two) times daily., Disp: 120 capsule, Rfl: 2   Insulin Pen Needle 32G X 4 MM MISC, 1 Device by Does not apply route daily., Disp: 150 each, Rfl: 3   levothyroxine (SYNTHROID) 25 MCG tablet, TAKE 1 TABLET IN THE MORNING WITH A GLASS OF WATER, WAIT 1 HOUR BEFORE TAKING OTHER MEDS/EATING., Disp: 30 tablet, Rfl: 1   lithium carbonate (LITHOBID) 300 MG CR tablet, TAKE 2 TABLETS BY MOUTH 2 TIMES DAILY., Disp: 360 tablet, Rfl: 0   metFORMIN (GLUCOPHAGE) 1000 MG tablet, Take 1 tablet (1,000  mg total) by mouth 2 (two) times daily with a meal., Disp: 180 tablet, Rfl: 3   perphenazine (TRILAFON) 16 MG tablet, Take 16 mg by mouth 2 (two) times daily., Disp: , Rfl:    Pseudoephedrine HCl (SUDAFED 12 HOUR PO), Take 1 tablet by mouth daily as needed (congestion)., Disp: , Rfl:    TURMERIC CURCUMIN PO, Take 1 capsule by mouth daily., Disp: , Rfl:    Vitamin D, Ergocalciferol,  (DRISDOL) 1.25 MG (50000 UNIT) CAPS capsule, Take 50,000 Units by mouth every Sunday., Disp: , Rfl:    clonazePAM (KLONOPIN) 1 MG tablet, Take 1 tablet (1 mg total) by mouth 2 (two) times daily as needed for anxiety., Disp: 60 tablet, Rfl: 2   DULoxetine (CYMBALTA) 30 MG capsule, Take 1 capsule (30 mg total) by mouth daily. Take 1 with the 60 mg=90 mg daily, Disp: 90 capsule, Rfl: 0   HYDROcodone-acetaminophen (NORCO) 10-325 MG tablet, Take 1 tablet by mouth every 4 (four) hours as needed. (Patient not taking: Reported on 12/13/2021), Disp: 28 tablet, Rfl: 0   insulin glargine (LANTUS SOLOSTAR) 100 UNIT/ML Solostar Pen, Inject 20 Units into the skin daily., Disp: 30 mL, Rfl: 3   nitroGLYCERIN (NITROSTAT) 0.4 MG SL tablet, Place 1 tablet (0.4 mg total) under the tongue every 5 (five) minutes as needed for chest pain., Disp: 100 tablet, Rfl: 3   ondansetron (ZOFRAN) 4 MG tablet, Take 1 tablet (4 mg total) by mouth every 8 (eight) hours as needed for nausea or vomiting. (Patient not taking: Reported on 12/13/2021), Disp: 10 tablet, Rfl: 0   sennosides-docusate sodium (SENOKOT-S) 8.6-50 MG tablet, Take 2 tablets by mouth daily. (Patient not taking: Reported on 12/13/2021), Disp: 30 tablet, Rfl: 1   spironolactone (ALDACTONE) 25 MG tablet, Take 0.5 tablets (12.5 mg total) by mouth daily. (Patient not taking: Reported on 10/13/2021), Disp: 45 tablet, Rfl: 3   traZODone (DESYREL) 100 MG tablet, Take 3 tablets (300 mg total) by mouth at bedtime., Disp: 270 tablet, Rfl: 0 Medication Side Effects: none  Family Medical/ Social History: Changes? no   MENTAL HEALTH EXAM:  There were no vitals taken for this visit.There is no height or weight on file to calculate BMI.  General Appearance: Casual, Well Groomed, Obese, and right upper extremity in arm sling  Eye Contact:  Fair  Speech:  Clear and Coherent and Normal Rate  Volume:  Normal  Mood:  Euthymic  Affect:  Congruent  Thought Process:  Goal Directed and  Descriptions of Associations: Circumstantial  Orientation:  Full (Time, Place, and Person)  Thought Content: Logical   Suicidal Thoughts:  No  Homicidal Thoughts:  No  Memory:  WNL  Judgement:  Fair  Insight:  Fair  Psychomotor Activity: Lateral mandibular motion  Concentration:  Concentration: Good and Attention Span: Fair  Recall:  Good  Fund of Knowledge: Good  Language: NA  Assets:  Desire for Improvement  ADL's:  Intact  Cognition: WNL  Prognosis:  Fair   Labs  11/02/2021 Total cholesterol 105, triglycerides 416, HDL 24, LDL 23  Other labs 11/02/2021, 11/17/2021, and 11/28/2021 were all related to recent shoulder surgery.  DIAGNOSES:    ICD-10-CM   1. 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 Generalized anxiety disorder  F41.1     4. Insomnia due to other mental disorder  F51.05    F99       Receiving  Psychotherapy: No   RECOMMENDATIONS:  PDMP unable to be reviewed. Crashes epic. I provided 30  minutes of face to face time during this encounter, including time spent before and after the visit in records review, medical decision making, counseling pertinent to today's visit, and charting.   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  He understands this that if this movement is TD it can be corrected but if we do not try the medication we won't know.  If we do not try anything he will continue to have the problem and it may never resolve.  He understands.  Overall he is doing well so no changes will be made.  Continue Klonopin 1 mg, 1 p.o. 3 times daily. Continue Cymbalta 90 mg daily.  Continue gabapentin 800 mg, 1 p.o. every morning, 1/2 pill at noon, 1 p.o. at 4 PM, and 1 p.o. nightly. Continue lithium CR 300 mg, 4 qhs. Continue perphenazine 16 mg, 1 po bid.   Continue trazodone 100 mg, 3 p.o. nightly as needed sleep. Return in 3 months.  Donnal Moat, PA-C

## 2021-12-19 ENCOUNTER — Other Ambulatory Visit: Payer: Self-pay | Admitting: Physician Assistant

## 2021-12-20 ENCOUNTER — Other Ambulatory Visit: Payer: Self-pay

## 2021-12-20 MED ORDER — ICOSAPENT ETHYL 1 G PO CAPS: 2.0000 g | ORAL_CAPSULE | Freq: Two times a day (BID) | ORAL | 2 refills | Status: DC

## 2021-12-22 ENCOUNTER — Telehealth: Payer: Self-pay | Admitting: Cardiology

## 2021-12-22 ENCOUNTER — Other Ambulatory Visit: Payer: Self-pay

## 2021-12-22 MED ORDER — ICOSAPENT ETHYL 1 G PO CAPS: 2.0000 g | ORAL_CAPSULE | Freq: Two times a day (BID) | ORAL | 3 refills | Status: DC

## 2021-12-22 NOTE — Telephone Encounter (Signed)
*  STAT* If patient is at the pharmacy, call can be transferred to refill team.   1. Which medications need to be refilled? (please list name of each medication and dose if known) icosapent Ethyl (VASCEPA) 1 g capsule  metFORMIN (GLUCOPHAGE) 1000 MG tablet  2. Which pharmacy/location (including street and city if local pharmacy) is medication to be sent to? [id number] - [place], [place] - [address] AT [address] [facility] [address]  3. Do they need a 30 day or 90 day supply? 90

## 2021-12-24 ENCOUNTER — Other Ambulatory Visit: Payer: Self-pay | Admitting: Physician Assistant

## 2021-12-24 MED ORDER — ICOSAPENT ETHYL 1 G PO CAPS: 2.0000 g | ORAL_CAPSULE | Freq: Two times a day (BID) | ORAL | 3 refills | Status: DC

## 2021-12-24 NOTE — Addendum Note (Signed)
Addended by: Malena Peer D on: 12/24/2021 06:59 PM   Modules accepted: Orders

## 2022-01-09 NOTE — Therapy (Signed)
OUTPATIENT PHYSICAL THERAPY SHOULDER EVALUATION   Patient Name: Matthew [name] MRN: [id number] DOB:1976-02-27, 46 y.o., male Today's Date: 01/10/2022   PT End of Session - 01/10/22 1234     Visit Number 1    Number of Visits 17    Date for PT Re-Evaluation 03/14/22    Authorization Type [place] MCD    Authorization Time Period Pending auth    PT Start Time 1200    PT Stop Time 3244    PT Time Calculation (min) 35 min    Activity Tolerance Patient tolerated treatment well    Behavior During Therapy [facility] for tasks assessed/performed             Past Medical History:  Diagnosis Date   Anxiety    Arthritis    Celiac disease    Coronary artery disease    Dairy allergy    Depression    Diabetes mellitus without complication ([place])    High cholesterol    Hypertension    Hypothyroidism    Myocardial infarction ([place])    Paranoia ([place])    Schizoaffective disorder ([place])    Past Surgical History:  Procedure Laterality Date   APPENDECTOMY     CARDIAC CATHETERIZATION     CORONARY STENT INTERVENTION N/A 06/06/2020   Procedure: CORONARY STENT INTERVENTION;  Surgeon: Martinique, Peter M, MD;  Location: [place] [facility];  Service: Cardiovascular;  Laterality: N/A;  prox LAD, distal RCA   INTRAVASCULAR ULTRASOUND/IVUS N/A 06/06/2020   Procedure: Intravascular Ultrasound/IVUS;  Surgeon: Martinique, Peter M, MD;  Location: [place] [facility];  Service: Cardiovascular;  Laterality: N/A;   LEFT HEART CATH AND CORONARY ANGIOGRAPHY N/A 06/06/2020   Procedure: LEFT HEART CATH AND CORONARY ANGIOGRAPHY;  Surgeon: Martinique, Peter M, MD;  Location: [place] [facility];  Service: Cardiovascular;  Laterality: N/A;   LEFT HEART CATH AND CORONARY ANGIOGRAPHY N/A 02/24/2021   Procedure: LEFT HEART CATH AND CORONARY ANGIOGRAPHY;  Surgeon: Belva Crome, MD;  Location: [place] [facility];  Service: Cardiovascular;  Laterality: N/A;   NASAL SEPTUM SURGERY     TOTAL SHOULDER ARTHROPLASTY Right 11/28/2021    Procedure: TOTAL SHOULDER ARTHROPLASTY;  Surgeon: Marchia Bond, MD;  [facility]: [facility];  Service: Orthopedics;  Laterality: Right;   WRIST FRACTURE SURGERY     right wrist   Patient Active Problem List   Diagnosis Date Noted   S/P shoulder replacement, right 11/28/2021   Atypical chest pain 04/23/2021   Syncope 04/23/2021   Chronic hyponatremia 04/23/2021   Leukocytosis 04/23/2021   Right rib fracture 04/23/2021   High cholesterol    Tobacco abuse    Unstable angina ([place]) 02/24/2021   Right-sided chest pain 09/11/2020   Coronary artery disease of bypass graft of native heart with stable angina pectoris ([place])    Uncontrolled type 2 diabetes mellitus with hyperglycemia ([place])    Hyponatremia    Essential hypertension    History of hyperprolactinemia 08/16/2020   Mixed dyslipidemia 08/15/2020   Chest pain 06/06/2020   Type 2 diabetes mellitus with hyperglycemia ([place]) 06/06/2020   Hyperkalemia 06/06/2020   Thrombocytopenia ([place]) 06/06/2020   Obesity (BMI 30.0-34.9) 06/06/2020   Non-ST elevation (NSTEMI) myocardial infarction [facility])    Attention deficit hyperactivity disorder (ADHD), combined type, moderate 03/10/2018   Generalized anxiety disorder 03/10/2018   Schizoaffective disorder, bipolar type without good prognostic features ([place]) 02/09/2015   Morbid obesity ([place]) 01/18/2015   OSA (obstructive sleep apnea) 07/14/2014   Solitary pulmonary nodule 07/14/2014  PCP: [name], MD  REFERRING PROVIDER: Marchia Bond, MD  REFERRING DIAG: s/p right shoulder replacement 11/28/2021  THERAPY DIAG:  Chronic right shoulder pain  Stiffness of right shoulder, not elsewhere classified  Localized edema  Muscle weakness (generalized)  Rationale for Evaluation and Treatment Rehabilitation  ONSET DATE: 11/28/2021  SUBJECTIVE:                                                                                                                                                                                       SUBJECTIVE STATEMENT: Pt reports primary c/o of Rt shoulder pain and stiffness 6 weeks and 1 day s/p Rt TSA on 11/28/2021 with Dr. Mardelle Matte. Pt reports decreased swelling about the shoulder, although mild swelling is still present. He also reports his surgical incisions are healing well, and he denies any N/T following his surgery. Pt denies any pain currently. Worst pain over past two weeks is 8/10. Aggravating factors include Rt shoulder flexion and abduction, laying on Rt side. Easing factors include pain medication, ice, and rest.   PERTINENT HISTORY: Rt TSA on 11/28/2021, HTN, hx of MI 05/2020, DMII, anxiety, depression, paranoia, Schizoaffective disorder, coronary stint interventions in 2022  PAIN:  Are you having pain? Yes: NPRS scale: 0/10 Pain location: Rt shoulder Pain description: Achy Aggravating factors: Rt shoulder flexion and abduction, laying on Rt side Relieving factors: pain medication, ice, and rest  PRECAUTIONS: Shoulder, s/p Rt TSA 11/28/2021, utilizing [facility]opedic specialists TSA protocol in lieu of post-op protocol   WEIGHT BEARING RESTRICTIONS No  FALLS:  Has patient fallen in last 6 months? No  LIVING ENVIRONMENT: Lives with: lives with their family Lives in: House/apartment Stairs: No Has following equipment at home: None  OCCUPATION: On disability  PLOF: Independent  PATIENT GOALS Pt would like to return to working, doing household chores with less limitation.  Screening for Suicide  Answer the following questions with Yes or No and place an "x" beside the action taken.  1. Over the past two weeks, have you felt down, depressed, or hopeless?   No  2. Within the past two weeks, have you felt little interest or pleasure in life?  No  If YES to either #1 or #2, then ask #3  3. Have you had thoughts that that life is not worth living or that you might be       better off dead?     If answer is NO and suspicion is  low, then end   4. Over this past week, have you had any thoughts about hurting or even killing yourself?    If NO, then end. Patient in no immediate  danger   5. If so, do you believe that you intend to or will harm yourself?       If NO, then end. Patient in no immediate danger   6.  Do you have a plan as to how you would hurt yourself?     7.  Over this past week, have you actually done anything to hurt yourself?    IF YES answers to either #4, #5, #6 or #7, then patient is AT RISK for suicide   Actions Taken  __X__  Screening negative; no further action required  ____  Screening positive; no immediate danger and patient already in treatment with a  mental health provider. Advise patient to speak to their mental health provider.  ____  Screening positive; no immediate danger. Patient advised to contact a mental  health provider for further assessment.   ____  Screening positive; in immediate danger as patient states intention of killing self,  has plan and a sense of imminence. Do not leave alone. Seek permission from  patient to contact a family member to inform them. Direct patient to go to ED.   OBJECTIVE:   DIAGNOSTIC FINDINGS:  11/28/2021: DG Shoulder Right Port: IMPRESSION: Interval total right shoulder arthroplasty without evidence of hardware failure.  09/26/2021: MR Shoulder Right With Contrast: IMPRESSION: 1. Age advanced glenohumeral degenerative changes with diffuse chondral thinning and prominent osteophytes of the humeral head inferiorly. 2. No specific signs of recent glenohumeral dislocation. There is no evidence of Hill-Sachs deformity or anterior labroligamentous injury. The labrum is diffusely degenerated, especially posteriorly. 3. Intact rotator cuff with mild infraspinatus tendinosis.  PATIENT SURVEYS:  Quick Dash 27/55, 49% disability  COGNITION:  Overall cognitive status: Within functional limits for tasks assessed     SENSATION: Not  tested  POSTURE: Forward head and rounded shoulders  UPPER EXTREMITY ROM:   A/PROM Right eval Left eval  Shoulder flexion 110p! 180  Shoulder abduction 102p! 180  Shoulder internal rotation 45 70/80  Shoulder external rotation 40 77/90  (Blank rows = not tested)  UPPER EXTREMITY MMT:  MMT Right eval Left eval  Shoulder flexion 4+/5 5/5  Shoulder abduction 4+/5 5/5  Shoulder internal rotation 4/5 5/5  Shoulder external rotation 4/5 5/5  Middle trapezius 3/5 4/5  Lower trapezius Not tested 3/5  Latissimus dorsi 3/5 4/5  Elbow flexion 5/5 5/5  Elbow extension 5/5 5/5  Grip strength (lbs)    (Blank rows = not tested)   JOINT MOBILITY TESTING:  WNL GHJ mobility in all planes BIL  PALPATION:  No TTP about Rt shoulder surgical incisions   TODAY'S TREATMENT:  01/10/2022: demonstrated and issued HEP   PATIENT EDUCATION: Education details: Pt educated on underlying pathophysiology, prognosis, protocol, POC, QuickDASH, and HEP Person educated: Patient and Parent Education method: Explanation, Demonstration, and Handouts Education comprehension: verbalized understanding and returned demonstration   HOME EXERCISE PROGRAM: Access Code: [email or web address] URL: [email or web address] Date: 01/10/2022 Prepared by: Vanessa Patillas  Exercises - Shoulder Scaption AAROM with Dowel  - 1 x daily - 7 x weekly - 3 sets - 10 reps - 5-sec hold - Standing Shoulder External Rotation AAROM with Dowel  - 1 x daily - 7 x weekly - 3 sets - 10 reps - 5-sec hold - Standing Isometric Shoulder Internal Rotation at Doorway with 50% force  - 1 x daily - 7 x weekly - 2 sets - 10 reps - 5-sec hold - Isometric shoulder flexion at wall with 50% force  - 1  x daily - 7 x weekly - 2 sets - 10 reps - 5-sec hold - Isometric shoulder external rotation at wall with 50% force  - 1 x daily - 7 x weekly - 2 sets - 10 reps - 5-sec hold - Isometric shoulder abduction at wall with 50% force  - 1 x daily -  7 x weekly - 2 sets - 10 reps - 5-sec hold  Patient Education - Scar Massage  ASSESSMENT:  CLINICAL IMPRESSION: Patient is a 46 y.o. M who was seen today for physical therapy evaluation and treatment for Rt shoulder pain and stiffness 6 weeks and 1 day s/p Rt TSA on 11/28/2021 with Dr. Mardelle Matte. Upon assessment, pt's primary impairments include globally limited and painful Rt shoulder AROM, globally weak Rt shoulder MMT, and globally weak BIL parascapular MMT. Pt will benefit from skilled PT to address his primary impairments and return to his prior level of function with less limitation. Due to post-op protocol not being provided, TSA post-op protocol from [facility] will be utilized during this POC.   OBJECTIVE IMPAIRMENTS decreased activity tolerance, decreased mobility, decreased ROM, decreased strength, hypomobility, increased edema, impaired flexibility, impaired UE functional use, improper body mechanics, postural dysfunction, and pain.   ACTIVITY LIMITATIONS carrying, lifting, sleeping, bathing, dressing, reach over head, and hygiene/grooming  PARTICIPATION LIMITATIONS: meal prep, cleaning, laundry, driving, shopping, community activity, occupation, and yard work  PERSONAL FACTORS 3+ comorbidities: See medical hx  are also affecting patient's functional outcome.   REHAB POTENTIAL: Good  CLINICAL DECISION MAKING: Stable/uncomplicated  EVALUATION COMPLEXITY: Low   GOALS: Goals reviewed with patient? Yes  SHORT TERM GOALS: Target date: 02/06/2022    Pt will report understanding and adherence to initial HEP in order to promote independence in the management of primary impairments. Baseline: HEP provided at eval Goal status: INITIAL   LONG TERM GOALS: Target date: 03/06/2022   Pt will achieve a QuickDASH score of 30% or less in order to demonstrate improved functional ability as it relates to his shoulder impairments. Baseline: 49% Goal status:  INITIAL  2.  Pt will demonstrate Rt shoulder elevation AROM of 150 degrees in order to get dressed with less limitation. Baseline: Flexion: 110, abduction: 102 Goal status: INITIAL  3.  Pt will demonstrate ability to lift 10# overhead with Rt UE in order to return to working with less limitation. Baseline: Unable to lift any amount of weight overhead Goal status: INITIAL  4.  Pt will achieve global Rt shoulder strength of 5/5 in order to return to working out independently with less limitation. Baseline: See MMT chart Goal status: INITIAL  5.  Pt will report worst pain of 0-3/10 in order to complete household ADLs with less limitation. Baseline: worst pain 8/10 Goal status: INITIAL    PLAN: PT FREQUENCY: 1-2x/week  PT DURATION: 8 weeks  PLANNED INTERVENTIONS: Therapeutic exercises, Therapeutic activity, Neuromuscular re-education, Patient/Family education, Self Care, Joint mobilization, Aquatic Therapy, Dry Needling, Electrical stimulation, Spinal manipulation, Spinal mobilization, Cryotherapy, Moist heat, scar mobilization, Taping, Vasopneumatic device, Biofeedback, Ionotophoresis 88m/ml Dexamethasone, Manual therapy, and Re-evaluation  PLAN FOR NEXT SESSION: Progress early shoulder ROM and strengthening in accordance with [facility] post-op protocol  YVanessa [place] PT, DPT 01/10/22 12:46 PM

## 2022-01-10 ENCOUNTER — Other Ambulatory Visit: Payer: Self-pay

## 2022-01-10 ENCOUNTER — Ambulatory Visit: Payer: Medicaid Other | Attending: Family Medicine

## 2022-01-10 DIAGNOSIS — M25611 Stiffness of right shoulder, not elsewhere classified: Secondary | ICD-10-CM | POA: Insufficient documentation

## 2022-01-10 DIAGNOSIS — G8929 Other chronic pain: Secondary | ICD-10-CM | POA: Diagnosis present

## 2022-01-10 DIAGNOSIS — M6281 Muscle weakness (generalized): Secondary | ICD-10-CM | POA: Diagnosis present

## 2022-01-10 DIAGNOSIS — M25511 Pain in right shoulder: Secondary | ICD-10-CM | POA: Insufficient documentation

## 2022-01-10 DIAGNOSIS — R600 Localized edema: Secondary | ICD-10-CM | POA: Insufficient documentation

## 2022-01-17 ENCOUNTER — Ambulatory Visit: Payer: Medicaid Other

## 2022-01-17 DIAGNOSIS — G8929 Other chronic pain: Secondary | ICD-10-CM

## 2022-01-17 DIAGNOSIS — M25611 Stiffness of right shoulder, not elsewhere classified: Secondary | ICD-10-CM

## 2022-01-17 DIAGNOSIS — R600 Localized edema: Secondary | ICD-10-CM

## 2022-01-17 DIAGNOSIS — M6281 Muscle weakness (generalized): Secondary | ICD-10-CM

## 2022-01-17 DIAGNOSIS — M25511 Pain in right shoulder: Secondary | ICD-10-CM | POA: Diagnosis not present

## 2022-01-17 NOTE — Therapy (Signed)
OUTPATIENT PHYSICAL THERAPY TREATMENT NOTE   Patient Name: Matthew [name] MRN: [id number] DOB:[birth year], 46 y.o., male Today's Date: 01/17/2022  PCP: [name], MD REFERRING PROVIDER: Marchia Bond, MD  END OF SESSION:   PT End of Session - 01/17/22 1450     Visit Number 2    Number of Visits 17    Date for PT Re-Evaluation 03/14/22    Authorization Type [place] MCD    Authorization Time Period Submitted for 3 initial visits    PT Start Time 1450    PT Stop Time 1528    PT Time Calculation (min) 38 min    Activity Tolerance Patient tolerated treatment well    Behavior During Therapy WFL for tasks assessed/performed             Past Medical History:  Diagnosis Date   Anxiety    Arthritis    Celiac disease    Coronary artery disease    Dairy allergy    Depression    Diabetes mellitus without complication ([place])    High cholesterol    Hypertension    Hypothyroidism    Myocardial infarction ([place])    Paranoia ([place])    Schizoaffective disorder ([place])    Past Surgical History:  Procedure Laterality Date   APPENDECTOMY     CARDIAC CATHETERIZATION     CORONARY STENT INTERVENTION N/A 06/06/2020   Procedure: CORONARY STENT INTERVENTION;  Surgeon: Martinique, Peter M, MD;  Location: [place] [facility];  Service: Cardiovascular;  Laterality: N/A;  prox LAD, distal RCA   INTRAVASCULAR ULTRASOUND/IVUS N/A 06/06/2020   Procedure: Intravascular Ultrasound/IVUS;  Surgeon: Martinique, Peter M, MD;  Location: [place] [facility];  Service: Cardiovascular;  Laterality: N/A;   LEFT HEART CATH AND CORONARY ANGIOGRAPHY N/A 06/06/2020   Procedure: LEFT HEART CATH AND CORONARY ANGIOGRAPHY;  Surgeon: Martinique, Peter M, MD;  Location: [place] [facility];  Service: Cardiovascular;  Laterality: N/A;   LEFT HEART CATH AND CORONARY ANGIOGRAPHY N/A 02/24/2021   Procedure: LEFT HEART CATH AND CORONARY ANGIOGRAPHY;  Surgeon: Belva Crome, MD;  Location: [place] [facility];  Service: Cardiovascular;   Laterality: N/A;   NASAL SEPTUM SURGERY     TOTAL SHOULDER ARTHROPLASTY Right 11/28/2021   Procedure: TOTAL SHOULDER ARTHROPLASTY;  Surgeon: Marchia Bond, MD;  [facility];  Service: Orthopedics;  Laterality: Right;   WRIST FRACTURE SURGERY     right wrist   Patient Active Problem List   Diagnosis Date Noted   S/P shoulder replacement, right 11/28/2021   Atypical chest pain 04/23/2021   Syncope 04/23/2021   Chronic hyponatremia 04/23/2021   Leukocytosis 04/23/2021   Right rib fracture 04/23/2021   High cholesterol    Tobacco abuse    Unstable angina ([place]) 02/24/2021   Right-sided chest pain 09/11/2020   Coronary artery disease of bypass graft of native heart with stable angina pectoris ([place])    Uncontrolled type 2 diabetes mellitus with hyperglycemia ([place])    Hyponatremia    Essential hypertension    History of hyperprolactinemia 08/16/2020   Mixed dyslipidemia 08/15/2020   Chest pain 06/06/2020   Type 2 diabetes mellitus with hyperglycemia ([place]) 06/06/2020   Hyperkalemia 06/06/2020   Thrombocytopenia ([place]) 06/06/2020   Obesity (BMI 30.0-34.9) 06/06/2020   Non-ST elevation (NSTEMI) myocardial infarction [facility])    Attention deficit hyperactivity disorder (ADHD), combined type, moderate 03/10/2018   Generalized anxiety disorder 03/10/2018   Schizoaffective disorder, bipolar type without good prognostic features ([place]) 02/09/2015   Morbid  obesity ([place]) 01/18/2015   OSA (obstructive sleep apnea) 07/14/2014   Solitary pulmonary nodule 07/14/2014    REFERRING DIAG: s/p right shoulder replacement 11/28/2021  THERAPY DIAG:  Chronic right shoulder pain  Stiffness of right shoulder, not elsewhere classified  Localized edema  Muscle weakness (generalized)  Rationale for Evaluation and Treatment Rehabilitation  PERTINENT HISTORY: Rt TSA on 11/28/2021, HTN, hx of MI 05/2020, DMII, anxiety, depression, paranoia, Schizoaffective disorder, coronary stint interventions in  2022  PRECAUTIONS: Shoulder, s/p Rt TSA 11/28/2021, utilizing [facility] orthopedic specialists TSA protocol in lieu of post-op protocol   SUBJECTIVE: Patient reports things have been doing well and his pain has been minimal.  PAIN:  Are you having pain? Yes: NPRS scale: 2/10 Pain location: Rt shoulder Pain description: Achy Aggravating factors: Rt shoulder flexion and abduction, laying on Rt side Relieving factors: pain medication, ice, and rest   OBJECTIVE: (objective measures completed at initial evaluation unless otherwise dated)   DIAGNOSTIC FINDINGS:  11/28/2021: DG Shoulder Right Port: IMPRESSION: Interval total right shoulder arthroplasty without evidence of hardware failure.   09/26/2021: MR Shoulder Right With Contrast: IMPRESSION: 1. Age advanced glenohumeral degenerative changes with diffuse chondral thinning and prominent osteophytes of the humeral head inferiorly. 2. No specific signs of recent glenohumeral dislocation. There is no evidence of Hill-Sachs deformity or anterior labroligamentous injury. The labrum is diffusely degenerated, especially posteriorly. 3. Intact rotator cuff with mild infraspinatus tendinosis.   PATIENT SURVEYS:  Quick Dash 27/55, 49% disability   COGNITION:           Overall cognitive status: Within functional limits for tasks assessed                                  SENSATION: Not tested   POSTURE: Forward head and rounded shoulders   UPPER EXTREMITY ROM:    A/PROM Right eval Left eval  Shoulder flexion 110p! 180  Shoulder abduction 102p! 180  Shoulder internal rotation 45 70/80  Shoulder external rotation 40 77/90  (Blank rows = not tested)   UPPER EXTREMITY MMT:   MMT Right eval Left eval  Shoulder flexion 4+/5 5/5  Shoulder abduction 4+/5 5/5  Shoulder internal rotation 4/5 5/5  Shoulder external rotation 4/5 5/5  Middle trapezius 3/5 4/5  Lower trapezius Not tested 3/5  Latissimus dorsi 3/5 4/5  Elbow  flexion 5/5 5/5  Elbow extension 5/5 5/5  Grip strength (lbs)      (Blank rows = not tested)     JOINT MOBILITY TESTING:  WNL GHJ mobility in all planes BIL   PALPATION:  No TTP about Rt shoulder surgical incisions             TODAY'S TREATMENT:  [facility] Adult PT Treatment:                                                DATE: 01/17/2022 Therapeutic Exercise: Supine balanced position 3x30" Supine balanced position circles CW/CCW x10 each  Supine AAROM flexion with dowel 2x10 Seated scapular retraction 3x10 Seated scaption AAROM with dowel 3x10 Seated ER AAROM with dowel 3x10 Manual Therapy: PROM in all motions, within protocol limitations   01/10/2022: demonstrated and issued HEP     PATIENT EDUCATION: Education details: Pt educated on underlying pathophysiology, prognosis, protocol, POC, QuickDASH, and  HEP Person educated: Patient and Parent Education method: Explanation, Demonstration, and Handouts Education comprehension: verbalized understanding and returned demonstration     HOME EXERCISE PROGRAM: Access Code: [email or web address] URL: [email or web address] Date: 01/10/2022 Prepared by: Vanessa [place]   Exercises - Shoulder Scaption AAROM with Dowel  - 1 x daily - 7 x weekly - 3 sets - 10 reps - 5-sec hold - Standing Shoulder External Rotation AAROM with Dowel  - 1 x daily - 7 x weekly - 3 sets - 10 reps - 5-sec hold - Standing Isometric Shoulder Internal Rotation at Doorway with 50% force  - 1 x daily - 7 x weekly - 2 sets - 10 reps - 5-sec hold - Isometric shoulder flexion at wall with 50% force  - 1 x daily - 7 x weekly - 2 sets - 10 reps - 5-sec hold - Isometric shoulder external rotation at wall with 50% force  - 1 x daily - 7 x weekly - 2 sets - 10 reps - 5-sec hold - Isometric shoulder abduction at wall with 50% force  - 1 x daily - 7 x weekly - 2 sets - 10 reps - 5-sec hold   Patient Education - Scar Massage   ASSESSMENT:   CLINICAL  IMPRESSION: Patient presents to PT with minimal pain in his Rt shoulder and reports HEP compliance. Session today focused on AAROM/PROM within protocol ranges. He was able to tolerate all exercises without an increase in pain. He had some pain with seated scaption AAROM, but not higher than 2/10. With PROM he has pain with ER at >10. Patient continues to benefit from skilled PT services and should be progressed as able to improve functional independence.    OBJECTIVE IMPAIRMENTS decreased activity tolerance, decreased mobility, decreased ROM, decreased strength, hypomobility, increased edema, impaired flexibility, impaired UE functional use, improper body mechanics, postural dysfunction, and pain.    ACTIVITY LIMITATIONS carrying, lifting, sleeping, bathing, dressing, reach over head, and hygiene/grooming   PARTICIPATION LIMITATIONS: meal prep, cleaning, laundry, driving, shopping, community activity, occupation, and yard work   PERSONAL FACTORS 3+ comorbidities: See medical hx  are also affecting patient's functional outcome.    REHAB POTENTIAL: Good   CLINICAL DECISION MAKING: Stable/uncomplicated   EVALUATION COMPLEXITY: Low     GOALS: Goals reviewed with patient? Yes   SHORT TERM GOALS: Target date: 02/06/2022     Pt will report understanding and adherence to initial HEP in order to promote independence in the management of primary impairments. Baseline: HEP provided at eval Goal status: INITIAL     LONG TERM GOALS: Target date: 03/06/2022    Pt will achieve a QuickDASH score of 30% or less in order to demonstrate improved functional ability as it relates to his shoulder impairments. Baseline: 49% Goal status: INITIAL   2.  Pt will demonstrate Rt shoulder elevation AROM of 150 degrees in order to get dressed with less limitation. Baseline: Flexion: 110, abduction: 102 Goal status: INITIAL   3.  Pt will demonstrate ability to lift 10# overhead with Rt UE in order to return to  working with less limitation. Baseline: Unable to lift any amount of weight overhead Goal status: INITIAL   4.  Pt will achieve global Rt shoulder strength of 5/5 in order to return to working out independently with less limitation. Baseline: See MMT chart Goal status: INITIAL   5.  Pt will report worst pain of 0-3/10 in order to complete household ADLs with less limitation. Baseline: worst pain 8/10 Goal  status: INITIAL       PLAN: PT FREQUENCY: 1-2x/week   PT DURATION: 8 weeks   PLANNED INTERVENTIONS: Therapeutic exercises, Therapeutic activity, Neuromuscular re-education, Patient/Family education, Self Care, Joint mobilization, Aquatic Therapy, Dry Needling, Electrical stimulation, Spinal manipulation, Spinal mobilization, Cryotherapy, Moist heat, scar mobilization, Taping, Vasopneumatic device, Biofeedback, Ionotophoresis 69m/ml Dexamethasone, Manual therapy, and Re-evaluation   PLAN FOR NEXT SESSION: Progress early shoulder ROM and strengthening in accordance with [facility] post-op protocol    SMargarette [place] PTA 01/17/2022, 3:29 PM

## 2022-01-18 ENCOUNTER — Ambulatory Visit: Payer: Medicaid Other

## 2022-01-18 DIAGNOSIS — R600 Localized edema: Secondary | ICD-10-CM

## 2022-01-18 DIAGNOSIS — M6281 Muscle weakness (generalized): Secondary | ICD-10-CM

## 2022-01-18 DIAGNOSIS — M25511 Pain in right shoulder: Secondary | ICD-10-CM | POA: Diagnosis not present

## 2022-01-18 DIAGNOSIS — M25611 Stiffness of right shoulder, not elsewhere classified: Secondary | ICD-10-CM

## 2022-01-18 DIAGNOSIS — G8929 Other chronic pain: Secondary | ICD-10-CM

## 2022-01-18 NOTE — Therapy (Signed)
OUTPATIENT PHYSICAL THERAPY TREATMENT NOTE   Patient Name: Matthew [name] MRN: [id number] DOB:04/27/1976, 46 y.o., male Today's Date: 01/18/2022  PCP: [name], MD REFERRING PROVIDER: Marchia Bond, MD  END OF SESSION:   PT End of Session - 01/18/22 1341     Visit Number 3    Number of Visits 17    Date for PT Re-Evaluation 03/14/22    Authorization Type [place] MCD    Authorization Time Period Submitted for 3 initial visits    PT Start Time 1305    PT Stop Time 1346    PT Time Calculation (min) 41 min    Activity Tolerance Patient tolerated treatment well    Behavior During Therapy WFL for tasks assessed/performed              Past Medical History:  Diagnosis Date   Anxiety    Arthritis    Celiac disease    Coronary artery disease    Dairy allergy    Depression    Diabetes mellitus without complication ([place])    High cholesterol    Hypertension    Hypothyroidism    Myocardial infarction ([place])    Paranoia ([place])    Schizoaffective disorder ([place])    Past Surgical History:  Procedure Laterality Date   APPENDECTOMY     CARDIAC CATHETERIZATION     CORONARY STENT INTERVENTION N/A 06/06/2020   Procedure: CORONARY STENT INTERVENTION;  Surgeon: Martinique, Peter M, MD;  Location: [place] [facility];  Service: Cardiovascular;  Laterality: N/A;  prox LAD, distal RCA   INTRAVASCULAR ULTRASOUND/IVUS N/A 06/06/2020   Procedure: Intravascular Ultrasound/IVUS;  Surgeon: Martinique, Peter M, MD;  Location: [place] [facility];  Service: Cardiovascular;  Laterality: N/A;   LEFT HEART CATH AND CORONARY ANGIOGRAPHY N/A 06/06/2020   Procedure: LEFT HEART CATH AND CORONARY ANGIOGRAPHY;  Surgeon: Martinique, Peter M, MD;  Location: [place] [facility];  Service: Cardiovascular;  Laterality: N/A;   LEFT HEART CATH AND CORONARY ANGIOGRAPHY N/A 02/24/2021   Procedure: LEFT HEART CATH AND CORONARY ANGIOGRAPHY;  Surgeon: Belva Crome, MD;  Location: [place] [facility];  Service: Cardiovascular;   Laterality: N/A;   NASAL SEPTUM SURGERY     TOTAL SHOULDER ARTHROPLASTY Right 11/28/2021   Procedure: TOTAL SHOULDER ARTHROPLASTY;  Surgeon: Marchia Bond, MD;  [facility];  Service: Orthopedics;  Laterality: Right;   WRIST FRACTURE SURGERY     right wrist   Patient Active Problem List   Diagnosis Date Noted   S/P shoulder replacement, right 11/28/2021   Atypical chest pain 04/23/2021   Syncope 04/23/2021   Chronic hyponatremia 04/23/2021   Leukocytosis 04/23/2021   Right rib fracture 04/23/2021   High cholesterol    Tobacco abuse    Unstable angina ([place]) 02/24/2021   Right-sided chest pain 09/11/2020   Coronary artery disease of bypass graft of native heart with stable angina pectoris ([place])    Uncontrolled type 2 diabetes mellitus with hyperglycemia ([place])    Hyponatremia    Essential hypertension    History of hyperprolactinemia 08/16/2020   Mixed dyslipidemia 08/15/2020   Chest pain 06/06/2020   Type 2 diabetes mellitus with hyperglycemia ([place]) 06/06/2020   Hyperkalemia 06/06/2020   Thrombocytopenia ([place]) 06/06/2020   Obesity (BMI 30.0-34.9) 06/06/2020   Non-ST elevation (NSTEMI) myocardial infarction [facility])    Attention deficit hyperactivity disorder (ADHD), combined type, moderate 03/10/2018   Generalized anxiety disorder 03/10/2018   Schizoaffective disorder, bipolar type without good prognostic features ([place]) 02/09/2015  Morbid obesity ([place]) 01/18/2015   OSA (obstructive sleep apnea) 07/14/2014   Solitary pulmonary nodule 07/14/2014    REFERRING DIAG: s/p right shoulder replacement 11/28/2021  THERAPY DIAG:  Chronic right shoulder pain  Stiffness of right shoulder, not elsewhere classified  Localized edema  Muscle weakness (generalized)  Rationale for Evaluation and Treatment Rehabilitation  PERTINENT HISTORY: Rt TSA on 11/28/2021, HTN, hx of MI 05/2020, DMII, anxiety, depression, paranoia, Schizoaffective disorder, coronary stint interventions in  2022  PRECAUTIONS: Shoulder, s/p Rt TSA 11/28/2021, utilizing [facility] orthopedic specialists TSA protocol in lieu of post-op protocol   SUBJECTIVE: Pt reports no pain today, although he does have tightness with Rt shoulder ER.   PAIN:  Are you having pain? Yes: NPRS scale: 2/10 Pain location: Rt shoulder Pain description: Achy Aggravating factors: Rt shoulder flexion and abduction, laying on Rt side Relieving factors: pain medication, ice, and rest   OBJECTIVE: (objective measures completed at initial evaluation unless otherwise dated)   DIAGNOSTIC FINDINGS:  11/28/2021: DG Shoulder Right Port: IMPRESSION: Interval total right shoulder arthroplasty without evidence of hardware failure.   09/26/2021: MR Shoulder Right With Contrast: IMPRESSION: 1. Age advanced glenohumeral degenerative changes with diffuse chondral thinning and prominent osteophytes of the humeral head inferiorly. 2. No specific signs of recent glenohumeral dislocation. There is no evidence of Hill-Sachs deformity or anterior labroligamentous injury. The labrum is diffusely degenerated, especially posteriorly. 3. Intact rotator cuff with mild infraspinatus tendinosis.   PATIENT SURVEYS:  Quick Dash 27/55, 49% disability   COGNITION:           Overall cognitive status: Within functional limits for tasks assessed                                  SENSATION: Not tested   POSTURE: Forward head and rounded shoulders   UPPER EXTREMITY ROM:    A/PROM Right eval Left eval  Shoulder flexion 110p! 180  Shoulder abduction 102p! 180  Shoulder internal rotation 45 70/80  Shoulder external rotation 40 77/90  (Blank rows = not tested)   UPPER EXTREMITY MMT:   MMT Right eval Left eval  Shoulder flexion 4+/5 5/5  Shoulder abduction 4+/5 5/5  Shoulder internal rotation 4/5 5/5  Shoulder external rotation 4/5 5/5  Middle trapezius 3/5 4/5  Lower trapezius Not tested 3/5  Latissimus dorsi 3/5 4/5  Elbow  flexion 5/5 5/5  Elbow extension 5/5 5/5  Grip strength (lbs)      (Blank rows = not tested)     JOINT MOBILITY TESTING:  WNL GHJ mobility in all planes BIL   PALPATION:  No TTP about Rt shoulder surgical incisions             TODAY'S TREATMENT:   [facility] Adult PT Treatment:                                                DATE: 01/18/2022 Therapeutic Exercise: Seated Rt shoulder scaption AROM from 45 degrees to 90 degrees 2x15 Standing shoulder AAROM with black physioball at wall with PT perturbations at end-range 4x30sec Bent-over Rt shoulder pendulums 3x30sec Seated BIL shoulder ER with elbows at side 3x10 Seated BIL biceps curls with 5# dumbbells 3x8 Seated Rt shoulder ER stretch with bolster on table to tolerance x2 minutes Manual Therapy: Lt  sidelying with Rt shoulder protraction/ scaption and retraction/ adduction contract/ relax MET x5 with 30-sec hold at end-range Lt sidelying with STM to Rt parascapular musculature x5 minutes Neuromuscular re-ed: N/A Therapeutic Activity: N/A Modalities: N/A Self Care: N/A   [facility] Adult PT Treatment:                                                DATE: 01/17/2022 Therapeutic Exercise: Supine balanced position 3x30" Supine balanced position circles CW/CCW x10 each  Supine AAROM flexion with dowel 2x10 Seated scapular retraction 3x10 Seated scaption AAROM with dowel 3x10 Seated ER AAROM with dowel 3x10 Manual Therapy: PROM in all motions, within protocol limitations   01/10/2022: demonstrated and issued HEP     PATIENT EDUCATION: Education details: Pt educated on underlying pathophysiology, prognosis, protocol, POC, QuickDASH, and HEP Person educated: Patient and Parent Education method: Explanation, Demonstration, and Handouts Education comprehension: verbalized understanding and returned demonstration     HOME EXERCISE PROGRAM: Access Code: [email or web address] URL: [email or web address] Date: 01/10/2022 Prepared by:  Vanessa [place]   Exercises - Shoulder Scaption AAROM with Dowel  - 1 x daily - 7 x weekly - 3 sets - 10 reps - 5-sec hold - Standing Shoulder External Rotation AAROM with Dowel  - 1 x daily - 7 x weekly - 3 sets - 10 reps - 5-sec hold - Standing Isometric Shoulder Internal Rotation at Doorway with 50% force  - 1 x daily - 7 x weekly - 2 sets - 10 reps - 5-sec hold - Isometric shoulder flexion at wall with 50% force  - 1 x daily - 7 x weekly - 2 sets - 10 reps - 5-sec hold - Isometric shoulder external rotation at wall with 50% force  - 1 x daily - 7 x weekly - 2 sets - 10 reps - 5-sec hold - Isometric shoulder abduction at wall with 50% force  - 1 x daily - 7 x weekly - 2 sets - 10 reps - 5-sec hold   Patient Education - Scar Massage   ASSESSMENT:   CLINICAL IMPRESSION: Pt is 7 weeks 2 days s/p Rt TSA on 11/28/2021. He responded well to progressed exercises, including banded exercise. He reports a therapeutic response to manual techniques today and will continue to benefit from skilled PT to address his primary impairments and return to his prior level of function with less limitation.   OBJECTIVE IMPAIRMENTS decreased activity tolerance, decreased mobility, decreased ROM, decreased strength, hypomobility, increased edema, impaired flexibility, impaired UE functional use, improper body mechanics, postural dysfunction, and pain.    ACTIVITY LIMITATIONS carrying, lifting, sleeping, bathing, dressing, reach over head, and hygiene/grooming   PARTICIPATION LIMITATIONS: meal prep, cleaning, laundry, driving, shopping, community activity, occupation, and yard work   PERSONAL FACTORS 3+ comorbidities: See medical hx  are also affecting patient's functional outcome.        GOALS: Goals reviewed with patient? Yes   SHORT TERM GOALS: Target date: 02/06/2022     Pt will report understanding and adherence to initial HEP in order to promote independence in the management of primary  impairments. Baseline: HEP provided at eval Goal status: INITIAL     LONG TERM GOALS: Target date: 03/06/2022    Pt will achieve a QuickDASH score of 30% or less in order to demonstrate improved functional ability as it relates to his shoulder impairments.  Baseline: 49% Goal status: INITIAL   2.  Pt will demonstrate Rt shoulder elevation AROM of 150 degrees in order to get dressed with less limitation. Baseline: Flexion: 110, abduction: 102 Goal status: INITIAL   3.  Pt will demonstrate ability to lift 10# overhead with Rt UE in order to return to working with less limitation. Baseline: Unable to lift any amount of weight overhead Goal status: INITIAL   4.  Pt will achieve global Rt shoulder strength of 5/5 in order to return to working out independently with less limitation. Baseline: See MMT chart Goal status: INITIAL   5.  Pt will report worst pain of 0-3/10 in order to complete household ADLs with less limitation. Baseline: worst pain 8/10 Goal status: INITIAL       PLAN: PT FREQUENCY: 1-2x/week   PT DURATION: 8 weeks   PLANNED INTERVENTIONS: Therapeutic exercises, Therapeutic activity, Neuromuscular re-education, Patient/Family education, Self Care, Joint mobilization, Aquatic Therapy, Dry Needling, Electrical stimulation, Spinal manipulation, Spinal mobilization, Cryotherapy, Moist heat, scar mobilization, Taping, Vasopneumatic device, Biofeedback, Ionotophoresis 41m/ml Dexamethasone, Manual therapy, and Re-evaluation   PLAN FOR NEXT SESSION: Progress early shoulder ROM and strengthening in accordance with [facility] post-op protocol   YVanessa [place] PT, DPT 01/18/22 1:46 PM

## 2022-01-20 ENCOUNTER — Other Ambulatory Visit: Payer: Self-pay | Admitting: Physician Assistant

## 2022-01-23 ENCOUNTER — Ambulatory Visit: Payer: Medicaid Other | Attending: Family Medicine

## 2022-01-23 DIAGNOSIS — M25611 Stiffness of right shoulder, not elsewhere classified: Secondary | ICD-10-CM | POA: Insufficient documentation

## 2022-01-23 DIAGNOSIS — M6281 Muscle weakness (generalized): Secondary | ICD-10-CM | POA: Insufficient documentation

## 2022-01-23 DIAGNOSIS — M25511 Pain in right shoulder: Secondary | ICD-10-CM | POA: Insufficient documentation

## 2022-01-23 DIAGNOSIS — G8929 Other chronic pain: Secondary | ICD-10-CM | POA: Diagnosis present

## 2022-01-23 DIAGNOSIS — R600 Localized edema: Secondary | ICD-10-CM | POA: Diagnosis present

## 2022-01-23 NOTE — Therapy (Signed)
OUTPATIENT PHYSICAL THERAPY TREATMENT NOTE   Patient Name: Matthew [name] MRN: [id number] DOB:1975-05-27, 46 y.o., male Today's Date: 01/23/2022  PCP: [name], MD REFERRING PROVIDER: Marchia Bond, MD  END OF SESSION:   PT End of Session - 01/23/22 1443     Visit Number 4    Number of Visits 17    Date for PT Re-Evaluation 03/14/22    Authorization Type [place] MCD    Authorization Time Period Submitted for 3 initial visits    PT Start Time 1445    PT Stop Time 1525    PT Time Calculation (min) 40 min    Activity Tolerance Patient tolerated treatment well    Behavior During Therapy [facility] for tasks assessed/performed             Past Medical History:  Diagnosis Date   Anxiety    Arthritis    Celiac disease    Coronary artery disease    Dairy allergy    Depression    Diabetes mellitus without complication ([place])    High cholesterol    Hypertension    Hypothyroidism    Myocardial infarction ([place])    Paranoia ([place])    Schizoaffective disorder ([place])    Past Surgical History:  Procedure Laterality Date   APPENDECTOMY     CARDIAC CATHETERIZATION     CORONARY STENT INTERVENTION N/A 06/06/2020   Procedure: CORONARY STENT INTERVENTION;  Surgeon: Martinique, Peter M, MD;  Location: [place] [facility];  Service: Cardiovascular;  Laterality: N/A;  prox LAD, distal RCA   INTRAVASCULAR ULTRASOUND/IVUS N/A 06/06/2020   Procedure: Intravascular Ultrasound/IVUS;  Surgeon: Martinique, Peter M, MD;  Location: [place] [facility];  Service: Cardiovascular;  Laterality: N/A;   LEFT HEART CATH AND CORONARY ANGIOGRAPHY N/A 06/06/2020   Procedure: LEFT HEART CATH AND CORONARY ANGIOGRAPHY;  Surgeon: Martinique, Peter M, MD;  Location: [place] [facility];  Service: Cardiovascular;  Laterality: N/A;   LEFT HEART CATH AND CORONARY ANGIOGRAPHY N/A 02/24/2021   Procedure: LEFT HEART CATH AND CORONARY ANGIOGRAPHY;  Surgeon: Belva Crome, MD;  Location: [place] [facility];  Service: Cardiovascular;   Laterality: N/A;   NASAL SEPTUM SURGERY     TOTAL SHOULDER ARTHROPLASTY Right 11/28/2021   Procedure: TOTAL SHOULDER ARTHROPLASTY;  Surgeon: Marchia Bond, MD;  Location: [facility];  Service: Orthopedics;  Laterality: Right;   WRIST FRACTURE SURGERY     right wrist   Patient Active Problem List   Diagnosis Date Noted   S/P shoulder replacement, right 11/28/2021   Atypical chest pain 04/23/2021   Syncope 04/23/2021   Chronic hyponatremia 04/23/2021   Leukocytosis 04/23/2021   Right rib fracture 04/23/2021   High cholesterol    Tobacco abuse    Unstable angina ([place]) 02/24/2021   Right-sided chest pain 09/11/2020   Coronary artery disease of bypass graft of native heart with stable angina pectoris ([place])    Uncontrolled type 2 diabetes mellitus with hyperglycemia ([place])    Hyponatremia    Essential hypertension    History of hyperprolactinemia 08/16/2020   Mixed dyslipidemia 08/15/2020   Chest pain 06/06/2020   Type 2 diabetes mellitus with hyperglycemia ([place]) 06/06/2020   Hyperkalemia 06/06/2020   Thrombocytopenia ([place]) 06/06/2020   Obesity (BMI 30.0-34.9) 06/06/2020   Non-ST elevation (NSTEMI) myocardial infarction [facility])    Attention deficit hyperactivity disorder (ADHD), combined type, moderate 03/10/2018   Generalized anxiety disorder 03/10/2018   Schizoaffective disorder, bipolar type without good prognostic features ([place]) 02/09/2015   Morbid  obesity ([place]) 01/18/2015   OSA (obstructive sleep apnea) 07/14/2014   Solitary pulmonary nodule 07/14/2014    REFERRING DIAG: s/p right shoulder replacement 11/28/2021  THERAPY DIAG:  Chronic right shoulder pain  Stiffness of right shoulder, not elsewhere classified  Localized edema  Muscle weakness (generalized)  Rationale for Evaluation and Treatment Rehabilitation  PERTINENT HISTORY: Rt TSA on 11/28/2021, HTN, hx of MI 05/2020, DMII, anxiety, depression, paranoia, Schizoaffective disorder, coronary stint interventions in  2022  PRECAUTIONS: Shoulder, s/p Rt TSA 11/28/2021, utilizing [facility] orthopedic specialists TSA protocol in lieu of post-op protocol   SUBJECTIVE: Patient reports some soreness in shoulder  PAIN:  Are you having pain? Yes: NPRS scale: 4/10 Pain location: Rt shoulder Pain description: Achy Aggravating factors: Rt shoulder flexion and abduction, laying on Rt side Relieving factors: pain medication, ice, and rest   OBJECTIVE: (objective measures completed at initial evaluation unless otherwise dated)   DIAGNOSTIC FINDINGS:  11/28/2021: DG Shoulder Right Port: IMPRESSION: Interval total right shoulder arthroplasty without evidence of hardware failure.   09/26/2021: MR Shoulder Right With Contrast: IMPRESSION: 1. Age advanced glenohumeral degenerative changes with diffuse chondral thinning and prominent osteophytes of the humeral head inferiorly. 2. No specific signs of recent glenohumeral dislocation. There is no evidence of Hill-Sachs deformity or anterior labroligamentous injury. The labrum is diffusely degenerated, especially posteriorly. 3. Intact rotator cuff with mild infraspinatus tendinosis.   PATIENT SURVEYS:  Quick Dash 27/55, 49% disability   COGNITION:           Overall cognitive status: Within functional limits for tasks assessed                                  SENSATION: Not tested   POSTURE: Forward head and rounded shoulders   UPPER EXTREMITY ROM:    A/PROM Right eval Left eval  Shoulder flexion 110p! 180  Shoulder abduction 102p! 180  Shoulder internal rotation 45 70/80  Shoulder external rotation 40 77/90  (Blank rows = not tested)   UPPER EXTREMITY MMT:   MMT Right eval Left eval  Shoulder flexion 4+/5 5/5  Shoulder abduction 4+/5 5/5  Shoulder internal rotation 4/5 5/5  Shoulder external rotation 4/5 5/5  Middle trapezius 3/5 4/5  Lower trapezius Not tested 3/5  Latissimus dorsi 3/5 4/5  Elbow flexion 5/5 5/5  Elbow extension 5/5  5/5  Grip strength (lbs)      (Blank rows = not tested)     JOINT MOBILITY TESTING:  WNL GHJ mobility in all planes BIL   PALPATION:  No TTP about Rt shoulder surgical incisions             TODAY'S TREATMENT:  [facility] Adult PT Treatment:                                                DATE: 01/23/2022 Therapeutic Exercise: Seated Rt shoulder scaption AROM from 45 degrees to 90 degrees 2x15 Standing shoulder AAROM with black physioball at wall with PT perturbations at end-range 4x30sec Bent-over Rt shoulder pendulums 3x30sec Seated BIL shoulder ER with elbows at side 3x10 Seated BIL biceps curls with blueTB 3x8 Supine AAROM flexion with dowel and 2# ankle weight 3x10 Supine chest press with dowel with 2# ankle weight 3x10  [facility] Adult PT Treatment:  DATE: 01/18/2022 Therapeutic Exercise: Seated Rt shoulder scaption AROM from 45 degrees to 90 degrees 2x15 Standing shoulder AAROM with black physioball at wall with PT perturbations at end-range 4x30sec Bent-over Rt shoulder pendulums 3x30sec Seated BIL shoulder ER with elbows at side 3x10 Seated BIL biceps curls with 5# dumbbells 3x8 Seated Rt shoulder ER stretch with bolster on table to tolerance x2 minutes Manual Therapy: Lt sidelying with Rt shoulder protraction/ scaption and retraction/ adduction contract/ relax MET x5 with 30-sec hold at end-range Lt sidelying with STM to Rt parascapular musculature x5 minutes Neuromuscular re-ed: N/A Therapeutic Activity: N/A Modalities: N/A Self Care: N/A   [facility] Adult PT Treatment:                                                DATE: 01/17/2022 Therapeutic Exercise: Supine balanced position 3x30" Supine balanced position circles CW/CCW x10 each  Supine AAROM flexion with dowel 2x10 Seated scapular retraction 3x10 Seated scaption AAROM with dowel 3x10 Seated ER AAROM with dowel 3x10 Manual Therapy: PROM in all motions, within protocol  limitations     PATIENT EDUCATION: Education details: Pt educated on underlying pathophysiology, prognosis, protocol, POC, QuickDASH, and HEP Person educated: Patient and Parent Education method: Explanation, Demonstration, and Handouts Education comprehension: verbalized understanding and returned demonstration     HOME EXERCISE PROGRAM: Access Code: [email or web address] URL: [email or web address] Date: 01/23/2022 Prepared by: Evelene Croon  Exercises - Standing Shoulder Scaption 45-90 degrees  - 1 x daily - 7 x weekly - 3 sets - 10 reps - Seated Single Arm Elbow Flexion with Resistance  - 1 x daily - 7 x weekly - 2 sets - 10 reps - Shoulder External Rotation with Resistance  - 1 x daily - 7 x weekly - 3 sets - 10 reps - Standing Isometric Shoulder Internal Rotation at Doorway  - 1 x daily - 7 x weekly - 3 sets - 10 reps - 5 seconds hold  Patient Education - Scar Massage   ASSESSMENT:   CLINICAL IMPRESSION: Patient presents to PT with soreness in his Rt shoulder and reports HEP compliance. Session today continued to focus on RTC and periscapular strengthening within protocol limitations. Patient was able to tolerate all prescribed exercises with no adverse effects, occasional soreness with exercises, but no increase in pain. Updated HEP this session with patient demonstrating understanding of each exercise. Patient continues to benefit from skilled PT services and should be progressed as able to improve functional independence.    OBJECTIVE IMPAIRMENTS decreased activity tolerance, decreased mobility, decreased ROM, decreased strength, hypomobility, increased edema, impaired flexibility, impaired UE functional use, improper body mechanics, postural dysfunction, and pain.    ACTIVITY LIMITATIONS carrying, lifting, sleeping, bathing, dressing, reach over head, and hygiene/grooming   PARTICIPATION LIMITATIONS: meal prep, cleaning, laundry, driving, shopping, community activity,  occupation, and yard work   PERSONAL FACTORS 3+ comorbidities: See medical hx  are also affecting patient's functional outcome.        GOALS: Goals reviewed with patient? Yes   SHORT TERM GOALS: Target date: 02/06/2022     Pt will report understanding and adherence to initial HEP in order to promote independence in the management of primary impairments. Baseline: HEP provided at eval Goal status: INITIAL     LONG TERM GOALS: Target date: 03/06/2022    Pt will achieve a QuickDASH score of 30% or  less in order to demonstrate improved functional ability as it relates to his shoulder impairments. Baseline: 49% Goal status: INITIAL   2.  Pt will demonstrate Rt shoulder elevation AROM of 150 degrees in order to get dressed with less limitation. Baseline: Flexion: 110, abduction: 102 Goal status: INITIAL   3.  Pt will demonstrate ability to lift 10# overhead with Rt UE in order to return to working with less limitation. Baseline: Unable to lift any amount of weight overhead Goal status: INITIAL   4.  Pt will achieve global Rt shoulder strength of 5/5 in order to return to working out independently with less limitation. Baseline: See MMT chart Goal status: INITIAL   5.  Pt will report worst pain of 0-3/10 in order to complete household ADLs with less limitation. Baseline: worst pain 8/10 Goal status: INITIAL       PLAN: PT FREQUENCY: 1-2x/week   PT DURATION: 8 weeks   PLANNED INTERVENTIONS: Therapeutic exercises, Therapeutic activity, Neuromuscular re-education, Patient/Family education, Self Care, Joint mobilization, Aquatic Therapy, Dry Needling, Electrical stimulation, Spinal manipulation, Spinal mobilization, Cryotherapy, Moist heat, scar mobilization, Taping, Vasopneumatic device, Biofeedback, Ionotophoresis 53m/ml Dexamethasone, Manual therapy, and Re-evaluation   PLAN FOR NEXT SESSION: Progress early shoulder ROM and strengthening in accordance with [facility] post-op protocol   SMargarette [place] PTA 01/23/22 3:26 PM

## 2022-01-24 ENCOUNTER — Other Ambulatory Visit: Payer: Self-pay | Admitting: *Deleted

## 2022-01-24 DIAGNOSIS — I2510 Atherosclerotic heart disease of native coronary artery without angina pectoris: Secondary | ICD-10-CM

## 2022-01-24 DIAGNOSIS — I10 Essential (primary) hypertension: Secondary | ICD-10-CM

## 2022-01-24 LAB — BASIC METABOLIC PANEL
BUN/Creatinine Ratio: 25 — ABNORMAL HIGH (ref 9–20)
BUN: 29 mg/dL — ABNORMAL HIGH (ref 6–24)
CO2: 19 mmol/L — ABNORMAL LOW (ref 20–29)
Calcium: 10.1 mg/dL (ref 8.7–10.2)
Chloride: 101 mmol/L (ref 96–106)
Creatinine, Ser: 1.18 mg/dL (ref 0.76–1.27)
Glucose: 195 mg/dL — ABNORMAL HIGH (ref 70–99)
Potassium: 4.4 mmol/L (ref 3.5–5.2)
Sodium: 138 mmol/L (ref 134–144)
eGFR: 79 mL/min/{1.73_m2} (ref >59)

## 2022-01-24 LAB — TSH: TSH: 2.02 u[IU]/mL (ref 0.450–4.500)

## 2022-01-24 NOTE — Progress Notes (Signed)
Please let him know the thyroid function test is in the normal range now.  Continue the same dose of levothyroxine. Blood sugar is elevated at 195 kidney function and electrolytes were normal.  Remind him to be compliant with diabetes treatment.  Thank you.

## 2022-01-25 ENCOUNTER — Ambulatory Visit: Payer: Medicaid Other

## 2022-01-25 DIAGNOSIS — M25511 Pain in right shoulder: Secondary | ICD-10-CM | POA: Diagnosis not present

## 2022-01-25 DIAGNOSIS — M25611 Stiffness of right shoulder, not elsewhere classified: Secondary | ICD-10-CM

## 2022-01-25 DIAGNOSIS — R600 Localized edema: Secondary | ICD-10-CM

## 2022-01-25 DIAGNOSIS — M6281 Muscle weakness (generalized): Secondary | ICD-10-CM

## 2022-01-25 DIAGNOSIS — G8929 Other chronic pain: Secondary | ICD-10-CM

## 2022-01-25 NOTE — Therapy (Signed)
OUTPATIENT PHYSICAL THERAPY TREATMENT NOTE   Patient Name: Matthew [name] MRN: [id number] DOB:Jun 08, 1975, 46 y.o., male Today's Date: 01/25/2022  PCP: [name], MD REFERRING PROVIDER: Marchia Bond, MD  END OF SESSION:   PT End of Session - 01/25/22 1355     Visit Number 5    Number of Visits 17    Date for PT Re-Evaluation 03/14/22    Authorization Type [place] M[employer]    Authorization Time Period Submitted for 3 initial visits    PT Start Time 1357    PT Stop Time 8127    PT Time Calculation (min) 40 min    Activity Tolerance Patient tolerated treatment well    Behavior During Therapy [facility] for tasks assessed/performed              Past Medical History:  Diagnosis Date   Anxiety    Arthritis    Celiac disease    Coronary artery disease    Dairy allergy    Depression    Diabetes mellitus without complication ([place])    High cholesterol    Hypertension    Hypothyroidism    Myocardial infarction ([place])    Paranoia ([place])    Schizoaffective disorder ([place])    Past Surgical History:  Procedure Laterality Date   APPENDECTOMY     CARDIAC CATHETERIZATION     CORONARY STENT INTERVENTION N/A 06/06/2020   Procedure: CORONARY STENT INTERVENTION;  Surgeon: Martinique, Peter M, MD;  Location: [place] [facility];  Service: Cardiovascular;  Laterality: N/A;  prox LAD, distal RCA   INTRAVASCULAR ULTRASOUND/IVUS N/A 06/06/2020   Procedure: Intravascular Ultrasound/IVUS;  Surgeon: Martinique, Peter M, MD;  Location: [place] [facility];  Service: Cardiovascular;  Laterality: N/A;   LEFT HEART CATH AND CORONARY ANGIOGRAPHY N/A 06/06/2020   Procedure: LEFT HEART CATH AND CORONARY ANGIOGRAPHY;  Surgeon: Martinique, Peter M, MD;  Location: [place] [facility];  Service: Cardiovascular;  Laterality: N/A;   LEFT HEART CATH AND CORONARY ANGIOGRAPHY N/A 02/24/2021   Procedure: LEFT HEART CATH AND CORONARY ANGIOGRAPHY;  Surgeon: Belva Crome, MD;  Location: [place] [facility];  Service: Cardiovascular;   Laterality: N/A;   NASAL SEPTUM SURGERY     TOTAL SHOULDER ARTHROPLASTY Right 11/28/2021   Procedure: TOTAL SHOULDER ARTHROPLASTY;  Surgeon: Marchia Bond, MD;  Location: [facility];  Service: Orthopedics;  Laterality: Right;   WRIST FRACTURE SURGERY     right wrist   Patient Active Problem List   Diagnosis Date Noted   S/P shoulder replacement, right 11/28/2021   Atypical chest pain 04/23/2021   Syncope 04/23/2021   Chronic hyponatremia 04/23/2021   Leukocytosis 04/23/2021   Right rib fracture 04/23/2021   High cholesterol    Tobacco abuse    Unstable angina ([place]) 02/24/2021   Right-sided chest pain 09/11/2020   Coronary artery disease of bypass graft of native heart with stable angina pectoris ([place])    Uncontrolled type 2 diabetes mellitus with hyperglycemia ([place])    Hyponatremia    Essential hypertension    History of hyperprolactinemia 08/16/2020   Mixed dyslipidemia 08/15/2020   Chest pain 06/06/2020   Type 2 diabetes mellitus with hyperglycemia ([place]) 06/06/2020   Hyperkalemia 06/06/2020   Thrombocytopenia ([place]) 06/06/2020   Obesity (BMI 30.0-34.9) 06/06/2020   Non-ST elevation (NSTEMI) myocardial infarction [facility])    Attention deficit hyperactivity disorder (ADHD), combined type, moderate 03/10/2018   Generalized anxiety disorder 03/10/2018   Schizoaffective disorder, bipolar type without good prognostic features ([place]) 02/09/2015  Morbid obesity ([place]) 01/18/2015   OSA (obstructive sleep apnea) 07/14/2014   Solitary pulmonary nodule 07/14/2014    REFERRING DIAG: s/p right shoulder replacement 11/28/2021  THERAPY DIAG:  Chronic right shoulder pain  Stiffness of right shoulder, not elsewhere classified  Localized edema  Muscle weakness (generalized)  Rationale for Evaluation and Treatment Rehabilitation  PERTINENT HISTORY: Rt TSA on 11/28/2021, HTN, hx of MI 05/2020, DMII, anxiety, depression, paranoia, Schizoaffective disorder, coronary stint interventions in  2022  PRECAUTIONS: Shoulder, s/p Rt TSA 11/28/2021, utilizing [facility] orthopedic specialists TSA protocol in lieu of post-op protocol   SUBJECTIVE: Pt reports increased Rt shoulder soreness today after doing his HEP yesterday and sleeping on his shoulder last night.   PAIN:  Are you having pain? Yes: NPRS scale: 5-6/10 Pain location: Rt shoulder Pain description: Achy Aggravating factors: Rt shoulder flexion and abduction, laying on Rt side Relieving factors: pain medication, ice, and rest   OBJECTIVE: (objective measures completed at initial evaluation unless otherwise dated)   DIAGNOSTIC FINDINGS:  11/28/2021: DG Shoulder Right Port: IMPRESSION: Interval total right shoulder arthroplasty without evidence of hardware failure.   09/26/2021: MR Shoulder Right With Contrast: IMPRESSION: 1. Age advanced glenohumeral degenerative changes with diffuse chondral thinning and prominent osteophytes of the humeral head inferiorly. 2. No specific signs of recent glenohumeral dislocation. There is no evidence of Hill-Sachs deformity or anterior labroligamentous injury. The labrum is diffusely degenerated, especially posteriorly. 3. Intact rotator cuff with mild infraspinatus tendinosis.   PATIENT SURVEYS:  Quick Dash 27/55, 49% disability   COGNITION:           Overall cognitive status: Within functional limits for tasks assessed                                  SENSATION: Not tested   POSTURE: Forward head and rounded shoulders   UPPER EXTREMITY ROM:    A/PROM Right eval Left eval Right 01/25/2022  Shoulder flexion 110p! 180 130p!  Shoulder abduction 102p! 180 119p!  Shoulder internal rotation 45 70/80 45  Shoulder external rotation 40 77/90 40  (Blank rows = not tested)   UPPER EXTREMITY MMT:   MMT Right eval Left eval  Shoulder flexion 4+/5 5/5  Shoulder abduction 4+/5 5/5  Shoulder internal rotation 4/5 5/5  Shoulder external rotation 4/5 5/5  Middle trapezius  3/5 4/5  Lower trapezius Not tested 3/5  Latissimus dorsi 3/5 4/5  Elbow flexion 5/5 5/5  Elbow extension 5/5 5/5  Grip strength (lbs)      (Blank rows = not tested)     JOINT MOBILITY TESTING:  WNL GHJ mobility in all planes BIL   PALPATION:  No TTP about Rt shoulder surgical incisions             TODAY'S TREATMENT:   [facility] Adult PT Treatment:                                                DATE: 01/25/2022 Therapeutic Exercise: Seated BIL shoulder scaption from 0-90 degrees with cues for scapular upward rotation as opposed to elevation 3x15 Seated Rt shoulder scaption AAROM with dowel rod 3x10 with 5-sec hold Seated Rt shoulder ER AAROM with dowel rod 3x10 with 5-sec hold Rt shoulder flexion AAROM with UE Ranger 2x10 Rt shoulder abduction  AAROM with UE Ranger 2x10 Standing Rt shoulder IR walkout with GTB 2x10 with 3-sec hold Standing BIL biceps curls with 6# dumbbells 3x10 Manual Therapy: N/A Neuromuscular re-ed: N/A Therapeutic Activity: Re-assessment of objective measures with pt education regarding protocol progression Modalities: N/A Self Care: N/A   [facility] Adult PT Treatment:                                                DATE: 01/23/2022 Therapeutic Exercise: Seated Rt shoulder scaption AROM from 45 degrees to 90 degrees 2x15 Standing shoulder AAROM with black physioball at wall with PT perturbations at end-range 4x30sec Bent-over Rt shoulder pendulums 3x30sec Seated BIL shoulder ER with elbows at side 3x10 Seated BIL biceps curls with blueTB 3x8 Supine AAROM flexion with dowel and 2# ankle weight 3x10 Supine chest press with dowel with 2# ankle weight 3x10  [facility] Adult PT Treatment:                                                DATE: 01/18/2022 Therapeutic Exercise: Seated Rt shoulder scaption AROM from 45 degrees to 90 degrees 2x15 Standing shoulder AAROM with black physioball at wall with PT perturbations at end-range 4x30sec Bent-over Rt shoulder pendulums  3x30sec Seated BIL shoulder ER with elbows at side 3x10 Seated BIL biceps curls with 5# dumbbells 3x8 Seated Rt shoulder ER stretch with bolster on table to tolerance x2 minutes Manual Therapy: Lt sidelying with Rt shoulder protraction/ scaption and retraction/ adduction contract/ relax MET x5 with 30-sec hold at end-range Lt sidelying with STM to Rt parascapular musculature x5 minutes Neuromuscular re-ed: N/A Therapeutic Activity: N/A Modalities: N/A Self Care: N/A      PATIENT EDUCATION: Education details: Pt educated on underlying pathophysiology, prognosis, protocol, POC, QuickDASH, and HEP Person educated: Patient and Parent Education method: Explanation, Demonstration, and Handouts Education comprehension: verbalized understanding and returned demonstration     HOME EXERCISE PROGRAM: Access Code: [email or web address] URL: [email or web address] Date: 01/23/2022 Prepared by: Evelene Croon  Exercises - Standing Shoulder Scaption 45-90 degrees  - 1 x daily - 7 x weekly - 3 sets - 10 reps - Seated Single Arm Elbow Flexion with Resistance  - 1 x daily - 7 x weekly - 2 sets - 10 reps - Shoulder External Rotation with Resistance  - 1 x daily - 7 x weekly - 3 sets - 10 reps - Standing Isometric Shoulder Internal Rotation at Doorway  - 1 x daily - 7 x weekly - 3 sets - 10 reps - 5 seconds hold  Patient Education - Scar Massage   ASSESSMENT:   CLINICAL IMPRESSION: Pt presents 8 weeks and 2 days s/p Rt TSA 11/28/2021. Pt continues to progress well with phase 2 protocol exercises and is advancing in his Rt shoulder AROM. He will continue to benefit from skilled PT to address his primary impairments and return to his prior level of function with less limitation.    OBJECTIVE IMPAIRMENTS decreased activity tolerance, decreased mobility, decreased ROM, decreased strength, hypomobility, increased edema, impaired flexibility, impaired UE functional use, improper body mechanics,  postural dysfunction, and pain.    ACTIVITY LIMITATIONS carrying, lifting, sleeping, bathing, dressing, reach over head, and hygiene/grooming   PARTICIPATION LIMITATIONS: meal prep, cleaning,  laundry, driving, shopping, community activity, occupation, and yard work   PERSONAL FACTORS 3+ comorbidities: See medical hx  are also affecting patient's functional outcome.        GOALS: Goals reviewed with patient? Yes   SHORT TERM GOALS: Target date: 02/06/2022     Pt will report understanding and adherence to initial HEP in order to promote independence in the management of primary impairments. Baseline: HEP provided at eval 01/25/2022: Pt reports adherence to his HEP Goal status: ACHIEVED     LONG TERM GOALS: Target date: 03/06/2022    Pt will achieve a QuickDASH score of 30% or less in order to demonstrate improved functional ability as it relates to his shoulder impairments. Baseline: 49% Goal status: INITIAL   2.  Pt will demonstrate Rt shoulder elevation AROM of 150 degrees in order to get dressed with less limitation. Baseline: Flexion: 110, abduction: 102 01/25/2022: Flexion: 130, abduction: 119 Goal status: IN PROGRESS   3.  Pt will demonstrate ability to lift 10# overhead with Rt UE in order to return to working with less limitation. Baseline: Unable to lift any amount of weight overhead Goal status: INITIAL   4.  Pt will achieve global Rt shoulder strength of 5/5 in order to return to working out independently with less limitation. Baseline: See MMT chart Goal status: INITIAL   5.  Pt will report worst pain of 0-3/10 in order to complete household ADLs with less limitation. Baseline: worst pain 8/10 01/25/2022: worst pain 6/10  Goal status: IN PROGRESS       PLAN: PT FREQUENCY: 1-2x/week   PT DURATION: 8 weeks   PLANNED INTERVENTIONS: Therapeutic exercises, Therapeutic activity, Neuromuscular re-education, Patient/Family education, Self Care, Joint mobilization,  Aquatic Therapy, Dry Needling, Electrical stimulation, Spinal manipulation, Spinal mobilization, Cryotherapy, Moist heat, scar mobilization, Taping, Vasopneumatic device, Biofeedback, Ionotophoresis 53m/ml Dexamethasone, Manual therapy, and Re-evaluation   PLAN FOR NEXT SESSION: Progress early shoulder ROM and strengthening in accordance with [facility] post-op protocol   YVanessa Camuy PT, DPT 01/25/22 2:38 PM

## 2022-01-29 ENCOUNTER — Ambulatory Visit: Payer: Medicaid Other | Attending: Cardiology

## 2022-01-29 ENCOUNTER — Other Ambulatory Visit: Payer: Self-pay | Admitting: Pharmacist

## 2022-01-29 DIAGNOSIS — I10 Essential (primary) hypertension: Secondary | ICD-10-CM

## 2022-01-29 DIAGNOSIS — E782 Mixed hyperlipidemia: Secondary | ICD-10-CM | POA: Insufficient documentation

## 2022-01-30 ENCOUNTER — Ambulatory Visit: Payer: Medicaid Other

## 2022-01-30 DIAGNOSIS — R600 Localized edema: Secondary | ICD-10-CM

## 2022-01-30 DIAGNOSIS — M25611 Stiffness of right shoulder, not elsewhere classified: Secondary | ICD-10-CM

## 2022-01-30 DIAGNOSIS — G8929 Other chronic pain: Secondary | ICD-10-CM

## 2022-01-30 DIAGNOSIS — M6281 Muscle weakness (generalized): Secondary | ICD-10-CM

## 2022-01-30 DIAGNOSIS — M25511 Pain in right shoulder: Secondary | ICD-10-CM | POA: Diagnosis not present

## 2022-01-30 LAB — LIPID PANEL
Chol/HDL Ratio: 4.3 ratio (ref 0.0–5.0)
Cholesterol, Total: 115 mg/dL (ref 100–199)
HDL: 27 mg/dL — ABNORMAL LOW (ref >39)
LDL Chol Calc (NIH): 54 mg/dL (ref 0–99)
Triglycerides: 204 mg/dL — ABNORMAL HIGH (ref 0–149)
VLDL Cholesterol Cal: 34 mg/dL (ref 5–40)

## 2022-01-30 LAB — APOLIPOPROTEIN B: Apolipoprotein B: 75 mg/dL (ref <90)

## 2022-01-30 NOTE — Therapy (Signed)
OUTPATIENT PHYSICAL THERAPY TREATMENT NOTE   Patient Name: Matthew [name] MRN: [id number] DOB:[birth year], 46 y.o., male Today's Date: 01/30/2022  PCP: [name], MD REFERRING PROVIDER: Marchia Bond, MD  END OF SESSION:   PT End of Session - 01/30/22 1408     Visit Number 6    Number of Visits 17    Date for PT Re-Evaluation 03/14/22    Authorization Type [place] [employer]    Authorization Time Period 01/30/2022-03/12/2022    Authorization - Visit Number 1    Authorization - Number of Visits 12    PT Start Time 0814    PT Stop Time 4818    PT Time Calculation (min) 38 min    Activity Tolerance Patient tolerated treatment well    Behavior During Therapy WFL for tasks assessed/performed               Past Medical History:  Diagnosis Date   Anxiety    Arthritis    Celiac disease    Coronary artery disease    Dairy allergy    Depression    Diabetes mellitus without complication ([place])    High cholesterol    Hypertension    Hypothyroidism    Myocardial infarction ([place])    Paranoia ([place])    Schizoaffective disorder ([place])    Past Surgical History:  Procedure Laterality Date   APPENDECTOMY     CARDIAC CATHETERIZATION     CORONARY STENT INTERVENTION N/A 06/06/2020   Procedure: CORONARY STENT INTERVENTION;  Surgeon: Martinique, Peter M, MD;  Location: [facility];  Service: Cardiovascular;  Laterality: N/A;  prox LAD, distal RCA   INTRAVASCULAR ULTRASOUND/IVUS N/A 06/06/2020   Procedure: Intravascular Ultrasound/IVUS;  Surgeon: Martinique, Peter M, MD;  Location: [name];  Service: Cardiovascular;  Laterality: N/A;   LEFT HEART CATH AND CORONARY ANGIOGRAPHY N/A 06/06/2020   Procedure: LEFT HEART CATH AND CORONARY ANGIOGRAPHY;  Surgeon: Martinique, Peter M, MD;  Location: [facility];  Service: Cardiovascular;  Laterality: N/A;   LEFT HEART CATH AND CORONARY ANGIOGRAPHY N/A 02/24/2021   Procedure: LEFT HEART CATH AND CORONARY ANGIOGRAPHY;  Surgeon: Belva Crome, MD;  Location: [facility];  Service: Cardiovascular;  Laterality: N/A;   NASAL SEPTUM SURGERY     TOTAL SHOULDER ARTHROPLASTY Right 11/28/2021   Procedure: TOTAL SHOULDER ARTHROPLASTY;  Surgeon: Marchia Bond, MD;  Location: [facility];  Service: Orthopedics;  Laterality: Right;   WRIST FRACTURE SURGERY     right wrist   Patient Active Problem List   Diagnosis Date Noted   S/P shoulder replacement, right 11/28/2021   Atypical chest pain 04/23/2021   Syncope 04/23/2021   Chronic hyponatremia 04/23/2021   Leukocytosis 04/23/2021   Right rib fracture 04/23/2021   High cholesterol    Tobacco abuse    Unstable angina ([facility]) 02/24/2021   Right-sided chest pain 09/11/2020   Coronary artery disease of bypass graft of native heart with stable angina pectoris ([place])    Uncontrolled type 2 diabetes mellitus with hyperglycemia ([place])    Hyponatremia    Essential hypertension    History of hyperprolactinemia 08/16/2020   Mixed dyslipidemia 08/15/2020   Chest pain 06/06/2020   Type 2 diabetes mellitus with hyperglycemia ([place]) 06/06/2020   Hyperkalemia 06/06/2020   Thrombocytopenia ([place]) 06/06/2020   Obesity (BMI 30.0-34.9) 06/06/2020   Non-ST elevation (NSTEMI) myocardial infarction [facility])    Attention deficit hyperactivity disorder (ADHD), combined type, moderate 03/10/2018   Generalized anxiety disorder 03/10/2018  Schizoaffective disorder, bipolar type without good prognostic features ([place]) 02/09/2015   Morbid obesity ([place]) 01/18/2015   OSA (obstructive sleep apnea) 07/14/2014   Solitary pulmonary nodule 07/14/2014    REFERRING DIAG: s/p right shoulder replacement 11/28/2021  THERAPY DIAG:  Chronic right shoulder pain  Stiffness of right shoulder, not elsewhere classified  Localized edema  Muscle weakness (generalized)  Rationale for Evaluation and Treatment Rehabilitation  PERTINENT HISTORY: Rt TSA on 11/28/2021, HTN, hx of MI 05/2020, DMII, anxiety, depression, paranoia,  Schizoaffective disorder, coronary stint interventions in 2022  PRECAUTIONS: Shoulder, s/p Rt TSA 11/28/2021, utilizing [facility] TSA protocol in lieu of post-op protocol   SUBJECTIVE: Pt reports 2/10 pain today, adding that he has been adherent to his HEP.  PAIN:  Are you having pain? Yes: NPRS scale: 5-6/10 Pain location: Rt shoulder Pain description: Achy Aggravating factors: Rt shoulder flexion and abduction, laying on Rt side Relieving factors: pain medication, ice, and rest   OBJECTIVE: (objective measures completed at initial evaluation unless otherwise dated)   DIAGNOSTIC FINDINGS:  11/28/2021: DG Shoulder Right Port: IMPRESSION: Interval total right shoulder arthroplasty without evidence of hardware failure.   09/26/2021: MR Shoulder Right With Contrast: IMPRESSION: 1. Age advanced glenohumeral degenerative changes with diffuse chondral thinning and prominent osteophytes of the humeral head inferiorly. 2. No specific signs of recent glenohumeral dislocation. There is no evidence of Hill-Sachs deformity or anterior labroligamentous injury. The labrum is diffusely degenerated, especially posteriorly. 3. Intact rotator cuff with mild infraspinatus tendinosis.   PATIENT SURVEYS:  [name] 27/55, 49% disability  Quick Dash (01/30/2022): 21/55, 38% disability   COGNITION:           Overall cognitive status: Within functional limits for tasks assessed                                  SENSATION: Not tested   POSTURE: Forward head and rounded shoulders   UPPER EXTREMITY ROM:    A/PROM Right eval Left eval Right 01/25/2022  Shoulder flexion 110p! 180 130p!  Shoulder abduction 102p! 180 119p!  Shoulder internal rotation 45 70/80 45  Shoulder external rotation 40 77/90 40  (Blank rows = not tested)   UPPER EXTREMITY MMT:   MMT Right eval Left eval  Shoulder flexion 4+/5 5/5  Shoulder abduction 4+/5 5/5  Shoulder internal rotation 4/5  5/5  Shoulder external rotation 4/5 5/5  Middle trapezius 3/5 4/5  Lower trapezius Not tested 3/5  Latissimus dorsi 3/5 4/5  Elbow flexion 5/5 5/5  Elbow extension 5/5 5/5  Grip strength (lbs)      (Blank rows = not tested)     JOINT MOBILITY TESTING:  WNL GHJ mobility in all planes BIL   PALPATION:  No TTP about Rt shoulder surgical incisions             TODAY'S TREATMENT:   [facility] Adult PT Treatment:                                                DATE: 01/30/2022 Therapeutic Exercise: Seated Rt shoulder scaption AROM with cues to avoid shoulder shrug 3x15 Seated low rows with 25# 3x10 Seated high rows with 15# 3x10 Sidelying 3# dumbbell Rt shoulder ER 3x10 with slow return Standing Rt shoulder flexion AAROM with  UE Ranger 2x10 Standing Rt shoulder abduction AAROM with UE Ranger 2x10 Seated Rt shoulder ER stretch with arm on bolster and progressive trunk flexion 3x10 with 5-sec hold Seated BIL 6# dumbbell curls 3x10 Seated shoulder rolls 2x10 forward and backward Supine BIL triceps extensions with 6# dumbbells with shoulders at 90 degrees flexion 3x10 Manual Therapy: N/A Neuromuscular re-ed: N/A Therapeutic Activity: N/A Modalities: N/A Self Care: N/A   [facility] Adult PT Treatment:                                                DATE: 01/25/2022 Therapeutic Exercise: Seated BIL shoulder scaption from 0-90 degrees with cues for scapular upward rotation as opposed to elevation 3x15 Seated Rt shoulder scaption AAROM with dowel rod 3x10 with 5-sec hold Seated Rt shoulder ER AAROM with dowel rod 3x10 with 5-sec hold Rt shoulder flexion AAROM with UE Ranger 2x10 Rt shoulder abduction AAROM with UE Ranger 2x10 Standing Rt shoulder IR walkout with GTB 2x10 with 3-sec hold Standing BIL biceps curls with 6# dumbbells 3x10 Manual Therapy: N/A Neuromuscular re-ed: N/A Therapeutic Activity: Re-assessment of objective measures with pt education regarding protocol  progression Modalities: N/A Self Care: N/A   [facility] Adult PT Treatment:                                                DATE: 01/23/2022 Therapeutic Exercise: Seated Rt shoulder scaption AROM from 45 degrees to 90 degrees 2x15 Standing shoulder AAROM with black physioball at wall with PT perturbations at end-range 4x30sec Bent-over Rt shoulder pendulums 3x30sec Seated BIL shoulder ER with elbows at side 3x10 Seated BIL biceps curls with blueTB 3x8 Supine AAROM flexion with dowel and 2# ankle weight 3x10 Supine chest press with dowel with 2# ankle weight 3x10    PATIENT EDUCATION: Education details: Pt educated on underlying pathophysiology, prognosis, protocol, POC, QuickDASH, and HEP Person educated: Patient and Parent Education method: Explanation, Demonstration, and Handouts Education comprehension: verbalized understanding and returned demonstration     HOME EXERCISE PROGRAM: Access Code: [email or web address] URL: [email or web address] Date: 01/23/2022 Prepared by: Evelene Croon  Exercises - Standing Shoulder Scaption 45-90 degrees  - 1 x daily - 7 x weekly - 3 sets - 10 reps - Seated Single Arm Elbow Flexion with Resistance  - 1 x daily - 7 x weekly - 2 sets - 10 reps - Shoulder External Rotation with Resistance  - 1 x daily - 7 x weekly - 3 sets - 10 reps - Standing Isometric Shoulder Internal Rotation at Doorway  - 1 x daily - 7 x weekly - 3 sets - 10 reps - 5 seconds hold  Patient Education - Scar Massage   ASSESSMENT:   CLINICAL IMPRESSION: Pt presents 9 weeks s/p Rt TSA 11/28/2021. Pt continues to progress well with PT, demonstrating good form and no pain with all exercises today. He will continue to benefit from skilled PT to address his primary impairments and return to his prior level of function with less limitation.    OBJECTIVE IMPAIRMENTS decreased activity tolerance, decreased mobility, decreased ROM, decreased strength, hypomobility, increased edema,  impaired flexibility, impaired UE functional use, improper body mechanics, postural dysfunction, and pain.    ACTIVITY LIMITATIONS carrying, lifting,  sleeping, bathing, dressing, reach over head, and hygiene/grooming   PARTICIPATION LIMITATIONS: meal prep, cleaning, laundry, driving, shopping, community activity, occupation, and yard work   PERSONAL FACTORS 3+ comorbidities: See medical hx  are also affecting patient's functional outcome.        GOALS: Goals reviewed with patient? Yes   SHORT TERM GOALS: Target date: 02/06/2022     Pt will report understanding and adherence to initial HEP in order to promote independence in the management of primary impairments. Baseline: HEP provided at eval 01/25/2022: Pt reports adherence to his HEP Goal status: ACHIEVED     LONG TERM GOALS: Target date: 03/06/2022    Pt will achieve a QuickDASH score of 30% or less in order to demonstrate improved functional ability as it relates to his shoulder impairments. Baseline: 49% 01/30/2022: 38% Goal status: IN PROGRESS   2.  Pt will demonstrate Rt shoulder elevation AROM of 150 degrees in order to get dressed with less limitation. Baseline: Flexion: 110, abduction: 102 01/25/2022: Flexion: 130, abduction: 119 Goal status: IN PROGRESS   3.  Pt will demonstrate ability to lift 10# overhead with Rt UE in order to return to working with less limitation. Baseline: Unable to lift any amount of weight overhead Goal status: INITIAL   4.  Pt will achieve global Rt shoulder strength of 5/5 in order to return to working out independently with less limitation. Baseline: See MMT chart Goal status: INITIAL   5.  Pt will report worst pain of 0-3/10 in order to complete household ADLs with less limitation. Baseline: worst pain 8/10 01/25/2022: worst pain 6/10  Goal status: IN PROGRESS       PLAN: PT FREQUENCY: 1-2x/week   PT DURATION: 8 weeks   PLANNED INTERVENTIONS: Therapeutic exercises, Therapeutic  activity, Neuromuscular re-education, Patient/Family education, Self Care, Joint mobilization, Aquatic Therapy, Dry Needling, Electrical stimulation, Spinal manipulation, Spinal mobilization, Cryotherapy, Moist heat, scar mobilization, Taping, Vasopneumatic device, Biofeedback, Ionotophoresis 29m/ml Dexamethasone, Manual therapy, and Re-evaluation   PLAN FOR NEXT SESSION: Progress early shoulder ROM and strengthening in accordance with [facility] post-op protocol   YVanessa Jan [place] PT, DPT 01/30/22 2:49 PM

## 2022-02-01 ENCOUNTER — Ambulatory Visit: Payer: Medicaid Other

## 2022-02-01 DIAGNOSIS — M25511 Pain in right shoulder: Secondary | ICD-10-CM | POA: Diagnosis not present

## 2022-02-01 DIAGNOSIS — R600 Localized edema: Secondary | ICD-10-CM

## 2022-02-01 DIAGNOSIS — M25611 Stiffness of right shoulder, not elsewhere classified: Secondary | ICD-10-CM

## 2022-02-01 DIAGNOSIS — G8929 Other chronic pain: Secondary | ICD-10-CM

## 2022-02-01 DIAGNOSIS — M6281 Muscle weakness (generalized): Secondary | ICD-10-CM

## 2022-02-01 NOTE — Therapy (Signed)
OUTPATIENT PHYSICAL THERAPY TREATMENT NOTE   Patient Name: Matthew [name] MRN: [id number] DOB:May 21, 1976, 46 y.o., male Today's Date: 02/01/2022  PCP: [name], MD REFERRING PROVIDER: Marchia Bond, MD  END OF SESSION:   PT End of Session - 02/01/22 1411     Visit Number 7    Number of Visits 17    Date for PT Re-Evaluation 03/14/22    Authorization Type [place] [facility]    Authorization Time Period 01/30/2022-03/12/2022    Authorization - Visit Number 2    Authorization - Number of Visits 12    PT Start Time 3710    PT Stop Time 6269    PT Time Calculation (min) 38 min    Activity Tolerance Patient tolerated treatment well    Behavior During Therapy WFL for tasks assessed/performed                Past Medical History:  Diagnosis Date   Anxiety    Arthritis    Celiac disease    Coronary artery disease    Dairy allergy    Depression    Diabetes mellitus without complication ([place])    High cholesterol    Hypertension    Hypothyroidism    Myocardial infarction ([place])    Paranoia ([place])    Schizoaffective disorder ([place])    Past Surgical History:  Procedure Laterality Date   APPENDECTOMY     CARDIAC CATHETERIZATION     CORONARY STENT INTERVENTION N/A 06/06/2020   Procedure: CORONARY STENT INTERVENTION;  Surgeon: Martinique, Peter M, MD;  Location: [facility];  Service: Cardiovascular;  Laterality: N/A;  prox LAD, distal RCA   INTRAVASCULAR ULTRASOUND/IVUS N/A 06/06/2020   Procedure: Intravascular Ultrasound/IVUS;  Surgeon: Martinique, Peter M, MD;  Location: [facility];  Service: Cardiovascular;  Laterality: N/A;   LEFT HEART CATH AND CORONARY ANGIOGRAPHY N/A 06/06/2020   Procedure: LEFT HEART CATH AND CORONARY ANGIOGRAPHY;  Surgeon: Martinique, Peter M, MD;  Location: [facility];  Service: Cardiovascular;  Laterality: N/A;   LEFT HEART CATH AND CORONARY ANGIOGRAPHY N/A 02/24/2021   Procedure: LEFT HEART CATH AND CORONARY ANGIOGRAPHY;  Surgeon: Belva Crome, MD;  Location: [facility];  Service: Cardiovascular;  Laterality: N/A;   NASAL SEPTUM SURGERY     TOTAL SHOULDER ARTHROPLASTY Right 11/28/2021   Procedure: TOTAL SHOULDER ARTHROPLASTY;  Surgeon: Marchia Bond, MD;  Location: [facility];  Service: Orthopedics;  Laterality: Right;   WRIST FRACTURE SURGERY     right wrist   Patient Active Problem List   Diagnosis Date Noted   S/P shoulder replacement, right 11/28/2021   Atypical chest pain 04/23/2021   Syncope 04/23/2021   Chronic hyponatremia 04/23/2021   Leukocytosis 04/23/2021   Right rib fracture 04/23/2021   High cholesterol    Tobacco abuse    Unstable angina ([place]) 02/24/2021   Right-sided chest pain 09/11/2020   Coronary artery disease of bypass graft of native heart with stable angina pectoris ([place])    Uncontrolled type 2 diabetes mellitus with hyperglycemia ([place])    Hyponatremia    Essential hypertension    History of hyperprolactinemia 08/16/2020   Mixed dyslipidemia 08/15/2020   Chest pain 06/06/2020   Type 2 diabetes mellitus with hyperglycemia ([place]) 06/06/2020   Hyperkalemia 06/06/2020   Thrombocytopenia ([place]) 06/06/2020   Obesity (BMI 30.0-34.9) 06/06/2020   Non-ST elevation (NSTEMI) myocardial infarction [facility])    Attention deficit hyperactivity disorder (ADHD), combined type, moderate 03/10/2018   Generalized anxiety disorder  03/10/2018   Schizoaffective disorder, bipolar type without good prognostic features ([place]) 02/09/2015   Morbid obesity ([place]) 01/18/2015   OSA (obstructive sleep apnea) 07/14/2014   Solitary pulmonary nodule 07/14/2014    REFERRING DIAG: s/p right shoulder replacement 11/28/2021  THERAPY DIAG:  Chronic right shoulder pain  Stiffness of right shoulder, not elsewhere classified  Localized edema  Muscle weakness (generalized)  Rationale for Evaluation and Treatment Rehabilitation  PERTINENT HISTORY: Rt TSA on 11/28/2021, HTN, hx of MI 05/2020, DMII, anxiety, depression,  paranoia, Schizoaffective disorder, coronary stint interventions in 2022  PRECAUTIONS: Shoulder, s/p Rt TSA 11/28/2021, utilizing [facility] TSA protocol in lieu of post-op protocol   SUBJECTIVE: Pt reports increased stiffness/ soreness in his Rt shoulder today, attributing this to "sleeping on it wrong." He reports adherence to his HEP  PAIN:  Are you having pain? Yes: NPRS scale: 5-6/10 Pain location: Rt shoulder Pain description: Achy Aggravating factors: Rt shoulder flexion and abduction, laying on Rt side Relieving factors: pain medication, ice, and rest   OBJECTIVE: (objective measures completed at initial evaluation unless otherwise dated)   DIAGNOSTIC FINDINGS:  11/28/2021: DG Shoulder Right Port: IMPRESSION: Interval total right shoulder arthroplasty without evidence of hardware failure.   09/26/2021: MR Shoulder Right With Contrast: IMPRESSION: 1. Age advanced glenohumeral degenerative changes with diffuse chondral thinning and prominent osteophytes of the humeral head inferiorly. 2. No specific signs of recent glenohumeral dislocation. There is no evidence of Hill-Sachs deformity or anterior labroligamentous injury. The labrum is diffusely degenerated, especially posteriorly. 3. Intact rotator cuff with mild infraspinatus tendinosis.   PATIENT SURVEYS:  [name] 27/55, 49% disability  Quick Dash (01/30/2022): 21/55, 38% disability   COGNITION:           Overall cognitive status: Within functional limits for tasks assessed                                  SENSATION: Not tested   POSTURE: Forward head and rounded shoulders   UPPER EXTREMITY ROM:    A/PROM Right eval Left eval Right 01/25/2022  Shoulder flexion 110p! 180 130p!  Shoulder abduction 102p! 180 119p!  Shoulder internal rotation 45 70/80 45  Shoulder external rotation 40 77/90 40  (Blank rows = not tested)   UPPER EXTREMITY MMT:   MMT Right eval Left eval  Shoulder  flexion 4+/5 5/5  Shoulder abduction 4+/5 5/5  Shoulder internal rotation 4/5 5/5  Shoulder external rotation 4/5 5/5  Middle trapezius 3/5 4/5  Lower trapezius Not tested 3/5  Latissimus dorsi 3/5 4/5  Elbow flexion 5/5 5/5  Elbow extension 5/5 5/5  Grip strength (lbs)      (Blank rows = not tested)     JOINT MOBILITY TESTING:  WNL GHJ mobility in all planes BIL   PALPATION:  No TTP about Rt shoulder surgical incisions             TODAY'S TREATMENT:   [facility] Adult PT Treatment:                                                DATE: 02/01/2022 Therapeutic Exercise: Rt shoulder IR walkout with concentric contraction at end of walkout with GTB 2x10 Standing shoulder rolls 2x10 forward and backward Seated low rows with 25# 3x10 Seated  high rows with 20# 3x10 Standing BIL shoulder scaption AROM with cues to avoid shoulder shrug and trunk extension 3x15 Standing Rt shoulder flexion AAROM with UE Ranger 2x10 with 5-sec hold Standing Rt shoulder abduction AAROM with UE Ranger 2x10 with 5-sec hold Standing 6# dumbbell biceps curls 3x15 BIL Supine BIL triceps extensions with 6# dumbbells with shoulders at 90 degrees flexion 3x10 Sidelying 4# dumbbell Rt shoulder ER 3x10 with slow return Seated Rt shoulder ER AAROM with dowel 2x10 with 5-sec hold Manual Therapy: N/A Neuromuscular re-ed: N/A Therapeutic Activity: N/A Modalities: N/A Self Care: N/A   [facility] Adult PT Treatment:                                                DATE: 01/30/2022 Therapeutic Exercise: Seated Rt shoulder scaption AROM with cues to avoid shoulder shrug 3x15 Seated low rows with 25# 3x10 Seated high rows with 15# 3x10 Sidelying 3# dumbbell Rt shoulder ER 3x10 with slow return Standing Rt shoulder flexion AAROM with UE Ranger 2x10 Standing Rt shoulder abduction AAROM with UE Ranger 2x10 Seated Rt shoulder ER stretch with arm on bolster and progressive trunk flexion 3x10 with 5-sec hold Seated BIL 6# dumbbell  curls 3x10 Seated shoulder rolls 2x10 forward and backward Supine BIL triceps extensions with 6# dumbbells with shoulders at 90 degrees flexion 3x10 Manual Therapy: N/A Neuromuscular re-ed: N/A Therapeutic Activity: N/A Modalities: N/A Self Care: N/A   [facility] Adult PT Treatment:                                                DATE: 01/25/2022 Therapeutic Exercise: Seated BIL shoulder scaption from 0-90 degrees with cues for scapular upward rotation as opposed to elevation 3x15 Seated Rt shoulder scaption AAROM with dowel rod 3x10 with 5-sec hold Seated Rt shoulder ER AAROM with dowel rod 3x10 with 5-sec hold Rt shoulder flexion AAROM with UE Ranger 2x10 Rt shoulder abduction AAROM with UE Ranger 2x10 Standing Rt shoulder IR walkout with GTB 2x10 with 3-sec hold Standing BIL biceps curls with 6# dumbbells 3x10 Manual Therapy: N/A Neuromuscular re-ed: N/A Therapeutic Activity: Re-assessment of objective measures with pt education regarding protocol progression Modalities: N/A Self Care: N/A     PATIENT EDUCATION: Education details: Pt educated on underlying pathophysiology, prognosis, protocol, POC, QuickDASH, and HEP Person educated: Patient and Parent Education method: Explanation, Demonstration, and Handouts Education comprehension: verbalized understanding and returned demonstration     HOME EXERCISE PROGRAM: Access Code: [email or web address] URL: [email or web address] Date: 01/23/2022 Prepared by: Evelene Croon  Exercises - Standing Shoulder Scaption 45-90 degrees  - 1 x daily - 7 x weekly - 3 sets - 10 reps - Seated Single Arm Elbow Flexion with Resistance  - 1 x daily - 7 x weekly - 2 sets - 10 reps - Shoulder External Rotation with Resistance  - 1 x daily - 7 x weekly - 3 sets - 10 reps - Standing Isometric Shoulder Internal Rotation at Doorway  - 1 x daily - 7 x weekly - 3 sets - 10 reps - 5 seconds hold  Patient Education - Scar Massage    ASSESSMENT:   CLINICAL IMPRESSION: Pt presents 9 weeks and 2 days s/p Rt TSA 11/28/2021. Pt  continues to progress well with [facility] post-op protocol, demonstrating good control with shoulder elevation AROM, as well as resisted IR and parascapular strengthening. He will continue to benefit from skilled PT to address his primary impairments and return to his prior level of function with less limitation.   OBJECTIVE IMPAIRMENTS decreased activity tolerance, decreased mobility, decreased ROM, decreased strength, hypomobility, increased edema, impaired flexibility, impaired UE functional use, improper body mechanics, postural dysfunction, and pain.    ACTIVITY LIMITATIONS carrying, lifting, sleeping, bathing, dressing, reach over head, and hygiene/grooming   PARTICIPATION LIMITATIONS: meal prep, cleaning, laundry, driving, shopping, community activity, occupation, and yard work   PERSONAL FACTORS 3+ comorbidities: See medical hx  are also affecting patient's functional outcome.        GOALS: Goals reviewed with patient? Yes   SHORT TERM GOALS: Target date: 02/06/2022     Pt will report understanding and adherence to initial HEP in order to promote independence in the management of primary impairments. Baseline: HEP provided at eval 01/25/2022: Pt reports adherence to his HEP Goal status: ACHIEVED     LONG TERM GOALS: Target date: 03/06/2022    Pt will achieve a QuickDASH score of 30% or less in order to demonstrate improved functional ability as it relates to his shoulder impairments. Baseline: 49% 01/30/2022: 38% Goal status: IN PROGRESS   2.  Pt will demonstrate Rt shoulder elevation AROM of 150 degrees in order to get dressed with less limitation. Baseline: Flexion: 110, abduction: 102 01/25/2022: Flexion: 130, abduction: 119 Goal status: IN PROGRESS   3.  Pt will demonstrate ability to lift 10# overhead with Rt UE in order to return to working with less  limitation. Baseline: Unable to lift any amount of weight overhead Goal status: INITIAL   4.  Pt will achieve global Rt shoulder strength of 5/5 in order to return to working out independently with less limitation. Baseline: See MMT chart Goal status: INITIAL   5.  Pt will report worst pain of 0-3/10 in order to complete household ADLs with less limitation. Baseline: worst pain 8/10 01/25/2022: worst pain 6/10  Goal status: IN PROGRESS       PLAN: PT FREQUENCY: 1-2x/week   PT DURATION: 8 weeks   PLANNED INTERVENTIONS: Therapeutic exercises, Therapeutic activity, Neuromuscular re-education, Patient/Family education, Self Care, Joint mobilization, Aquatic Therapy, Dry Needling, Electrical stimulation, Spinal manipulation, Spinal mobilization, Cryotherapy, Moist heat, scar mobilization, Taping, Vasopneumatic device, Biofeedback, Ionotophoresis 77m/ml Dexamethasone, Manual therapy, and Re-evaluation   PLAN FOR NEXT SESSION: Progress early shoulder ROM and strengthening in accordance with [facility] post-op protocol   YVanessa P[place] del [place] PT, DPT 02/01/22 2:45 PM

## 2022-02-02 ENCOUNTER — Other Ambulatory Visit: Payer: Self-pay | Admitting: Physician Assistant

## 2022-02-06 ENCOUNTER — Ambulatory Visit: Payer: Medicaid Other

## 2022-02-06 DIAGNOSIS — R600 Localized edema: Secondary | ICD-10-CM

## 2022-02-06 DIAGNOSIS — G8929 Other chronic pain: Secondary | ICD-10-CM

## 2022-02-06 DIAGNOSIS — M25611 Stiffness of right shoulder, not elsewhere classified: Secondary | ICD-10-CM

## 2022-02-06 DIAGNOSIS — M25511 Pain in right shoulder: Secondary | ICD-10-CM | POA: Diagnosis not present

## 2022-02-06 DIAGNOSIS — M6281 Muscle weakness (generalized): Secondary | ICD-10-CM

## 2022-02-06 NOTE — Therapy (Signed)
OUTPATIENT PHYSICAL THERAPY TREATMENT NOTE   Patient Name: Matthew [name] MRN: [id number] DOB:05/31/1975, 46 y.o., male Today's Date: 02/06/2022  PCP: [name], MD REFERRING PROVIDER: Marchia Bond, MD  END OF SESSION:   PT End of Session - 02/06/22 1359     Visit Number 8    Number of Visits 17    Date for PT Re-Evaluation 03/14/22    Authorization Type [place] [facility]    Authorization Time Period 01/30/2022-03/12/2022    Authorization - Visit Number 3    Authorization - Number of Visits 12    PT Start Time 1400    PT Stop Time 1440    PT Time Calculation (min) 40 min    Activity Tolerance Patient tolerated treatment well    Behavior During Therapy WFL for tasks assessed/performed                 Past Medical History:  Diagnosis Date   Anxiety    Arthritis    Celiac disease    Coronary artery disease    Dairy allergy    Depression    Diabetes mellitus without complication ([place])    High cholesterol    Hypertension    Hypothyroidism    Myocardial infarction ([place])    Paranoia ([place])    Schizoaffective disorder ([place])    Past Surgical History:  Procedure Laterality Date   APPENDECTOMY     CARDIAC CATHETERIZATION     CORONARY STENT INTERVENTION N/A 06/06/2020   Procedure: CORONARY STENT INTERVENTION;  Surgeon: Martinique, Peter M, MD;  Location: [facility];  Service: Cardiovascular;  Laterality: N/A;  prox LAD, distal RCA   INTRAVASCULAR ULTRASOUND/IVUS N/A 06/06/2020   Procedure: Intravascular Ultrasound/IVUS;  Surgeon: Martinique, Peter M, MD;  Location: [facility];  Service: Cardiovascular;  Laterality: N/A;   LEFT HEART CATH AND CORONARY ANGIOGRAPHY N/A 06/06/2020   Procedure: LEFT HEART CATH AND CORONARY ANGIOGRAPHY;  Surgeon: Martinique, Peter M, MD;  Location: [facility];  Service: Cardiovascular;  Laterality: N/A;   LEFT HEART CATH AND CORONARY ANGIOGRAPHY N/A 02/24/2021   Procedure: LEFT HEART CATH AND CORONARY ANGIOGRAPHY;  Surgeon: Belva Crome, MD;  Location: [facility];  Service: Cardiovascular;  Laterality: N/A;   NASAL SEPTUM SURGERY     TOTAL SHOULDER ARTHROPLASTY Right 11/28/2021   Procedure: TOTAL SHOULDER ARTHROPLASTY;  Surgeon: Marchia Bond, MD;  Location: [facility];  Service: Orthopedics;  Laterality: Right;   WRIST FRACTURE SURGERY     right wrist   Patient Active Problem List   Diagnosis Date Noted   S/P shoulder replacement, right 11/28/2021   Atypical chest pain 04/23/2021   Syncope 04/23/2021   Chronic hyponatremia 04/23/2021   Leukocytosis 04/23/2021   Right rib fracture 04/23/2021   High cholesterol    Tobacco abuse    Unstable angina ([place]) 02/24/2021   Right-sided chest pain 09/11/2020   Coronary artery disease of bypass graft of native heart with stable angina pectoris ([place])    Uncontrolled type 2 diabetes mellitus with hyperglycemia ([place])    Hyponatremia    Essential hypertension    History of hyperprolactinemia 08/16/2020   Mixed dyslipidemia 08/15/2020   Chest pain 06/06/2020   Type 2 diabetes mellitus with hyperglycemia ([place]) 06/06/2020   Hyperkalemia 06/06/2020   Thrombocytopenia ([place]) 06/06/2020   Obesity (BMI 30.0-34.9) 06/06/2020   Non-ST elevation (NSTEMI) myocardial infarction [facility])    Attention deficit hyperactivity disorder (ADHD), combined type, moderate 03/10/2018   Generalized anxiety  disorder 03/10/2018   Schizoaffective disorder, bipolar type without good prognostic features ([place]) 02/09/2015   Morbid obesity ([place]) 01/18/2015   OSA (obstructive sleep apnea) 07/14/2014   Solitary pulmonary nodule 07/14/2014    REFERRING DIAG: s/p right shoulder replacement 11/28/2021  THERAPY DIAG:  Chronic right shoulder pain  Stiffness of right shoulder, not elsewhere classified  Localized edema  Muscle weakness (generalized)  Rationale for Evaluation and Treatment Rehabilitation  PERTINENT HISTORY: Rt TSA on 11/28/2021, HTN, hx of MI 05/2020, DMII, anxiety, depression,  paranoia, Schizoaffective disorder, coronary stint interventions in 2022  PRECAUTIONS: Shoulder, s/p Rt TSA 11/28/2021, utilizing [facility] orthopedic specialists TSA protocol in lieu of post-op protocol   SUBJECTIVE: Pt reports doing well today, although he reports non-adherence to his HEP due to being busy this past weekend.  PAIN:  Are you having pain? Yes: NPRS scale: 0/10 Pain location: Rt shoulder Pain description: Achy Aggravating factors: Rt shoulder flexion and abduction, laying on Rt side Relieving factors: pain medication, ice, and rest   OBJECTIVE: (objective measures completed at initial evaluation unless otherwise dated)   DIAGNOSTIC FINDINGS:  11/28/2021: DG Shoulder Right Port: IMPRESSION: Interval total right shoulder arthroplasty without evidence of hardware failure.   09/26/2021: MR Shoulder Right With Contrast: IMPRESSION: 1. Age advanced glenohumeral degenerative changes with diffuse chondral thinning and prominent osteophytes of the humeral head inferiorly. 2. No specific signs of recent glenohumeral dislocation. There is no evidence of Hill-Sachs deformity or anterior labroligamentous injury. The labrum is diffusely degenerated, especially posteriorly. 3. Intact rotator cuff with mild infraspinatus tendinosis.   PATIENT SURVEYS:  [name] 27/55, 49% disability  Quick Dash (01/30/2022): 21/55, 38% disability   COGNITION:           Overall cognitive status: Within functional limits for tasks assessed                                  SENSATION: Not tested   POSTURE: Forward head and rounded shoulders   UPPER EXTREMITY ROM:    A/PROM Right eval Left eval Right 01/25/2022 Right 02/06/2022  Shoulder flexion 110p! 180 130p! 137p!/ 140p!  Shoulder abduction 102p! 180 119p! 137p!/ 160  Shoulder internal rotation 45 70/80 45 57/70  Shoulder external rotation 40 77/90 40 50/55  (Blank rows = not tested)   UPPER EXTREMITY MMT:   MMT Right eval  Left eval Right 02/06/2022  Shoulder flexion 4+/5 5/5 5/5  Shoulder abduction 4+/5 5/5 5/5  Shoulder internal rotation 4/5 5/5 5/5  Shoulder external rotation 4/5 5/5 5/5  Middle trapezius 3/5 4/5   Lower trapezius Not tested 3/5   Latissimus dorsi 3/5 4/5   Elbow flexion 5/5 5/5   Elbow extension 5/5 5/5   Grip strength (lbs)       (Blank rows = not tested)     JOINT MOBILITY TESTING:  WNL GHJ mobility in all planes BIL   PALPATION:  No TTP about Rt shoulder surgical incisions             TODAY'S TREATMENT:   [facility] Adult PT Treatment:                                                DATE: 02/06/2022 Therapeutic Exercise: Seated BIL shoulder scaption to roughly 110 degrees with 2# dumbbell  3x10 Seated Rt shoulder scaption AAROM with dowel rod 2x10 with 5-sec holds Rt shoulder IR walkout with concentric contraction at end of walkout with GTB 2x10 Rt shoulder ER walkout with concentric contraction at end of walkout with RTB 2x10 Standing BIL biceps curls 3x12 with 7# dumbbells Supine BIL triceps extensions with 7# dumbbells with shoulders at 90 degrees flexion 3x10 Supine BIL shoulder flexion AAROM with dowel with 3# ankle weight 2x10 with 5-sec hold Standing Rt shoulder abduction AAROM with UE Ranger 2x10 with 5-sec hold Seated low rows with 35# cable 2x10 Manual Therapy: N/A Neuromuscular re-ed: N/A Therapeutic Activity: Re-assessment of objective measures with pt education Modalities: N/A Self Care: N/A   [facility] Adult PT Treatment:                                                DATE: 02/01/2022 Therapeutic Exercise: Rt shoulder IR walkout with concentric contraction at end of walkout with GTB 2x10 Standing shoulder rolls 2x10 forward and backward Seated low rows with 25# 3x10 Seated high rows with 20# 3x10 Standing BIL shoulder scaption AROM with cues to avoid shoulder shrug and trunk extension 3x15 Standing Rt shoulder flexion AAROM with UE Ranger 2x10 with 5-sec  hold Standing Rt shoulder abduction AAROM with UE Ranger 2x10 with 5-sec hold Standing 6# dumbbell biceps curls 3x15 BIL Supine BIL triceps extensions with 6# dumbbells with shoulders at 90 degrees flexion 3x10 Sidelying 4# dumbbell Rt shoulder ER 3x10 with slow return Seated Rt shoulder ER AAROM with dowel 2x10 with 5-sec hold Manual Therapy: N/A Neuromuscular re-ed: N/A Therapeutic Activity: N/A Modalities: N/A Self Care: N/A   [facility] Adult PT Treatment:                                                DATE: 01/30/2022 Therapeutic Exercise: Seated Rt shoulder scaption AROM with cues to avoid shoulder shrug 3x15 Seated low rows with 25# 3x10 Seated high rows with 15# 3x10 Sidelying 3# dumbbell Rt shoulder ER 3x10 with slow return Standing Rt shoulder flexion AAROM with UE Ranger 2x10 Standing Rt shoulder abduction AAROM with UE Ranger 2x10 Seated Rt shoulder ER stretch with arm on bolster and progressive trunk flexion 3x10 with 5-sec hold Seated BIL 6# dumbbell curls 3x10 Seated shoulder rolls 2x10 forward and backward Supine BIL triceps extensions with 6# dumbbells with shoulders at 90 degrees flexion 3x10 Manual Therapy: N/A Neuromuscular re-ed: N/A Therapeutic Activity: N/A Modalities: N/A Self Care: N/A     PATIENT EDUCATION: Education details: Pt educated on underlying pathophysiology, prognosis, protocol, POC, QuickDASH, and HEP Person educated: Patient and Parent Education method: Explanation, Demonstration, and Handouts Education comprehension: verbalized understanding and returned demonstration     HOME EXERCISE PROGRAM: Access Code: [email or web address] URL: [email or web address] Date: 01/23/2022 Prepared by: Evelene Croon  Exercises - Standing Shoulder Scaption 45-90 degrees  - 1 x daily - 7 x weekly - 3 sets - 10 reps - Seated Single Arm Elbow Flexion with Resistance  - 1 x daily - 7 x weekly - 2 sets - 10 reps - Shoulder External Rotation with  Resistance  - 1 x daily - 7 x weekly - 3 sets - 10 reps - Standing Isometric Shoulder Internal Rotation at  Doorway  - 1 x daily - 7 x weekly - 3 sets - 10 reps - 5 seconds hold  Patient Education - Scar Massage   ASSESSMENT:   CLINICAL IMPRESSION: Pt presents 9 weeks and 2 days s/p Rt TSA 11/28/2021. Pt continues to progress well with [facility] post-op protocol, demonstrating ability to initiate light-weight overhead lifts with good form and no pain. Upon re-assessment of objective measures, the pt continues to make excellent progress in Rt shoulder global AROM and strength. He will continue to benefit from skilled PT to address his primary impairments and return to his prior level of function with less limitation.   OBJECTIVE IMPAIRMENTS decreased activity tolerance, decreased mobility, decreased ROM, decreased strength, hypomobility, increased edema, impaired flexibility, impaired UE functional use, improper body mechanics, postural dysfunction, and pain.    ACTIVITY LIMITATIONS carrying, lifting, sleeping, bathing, dressing, reach over head, and hygiene/grooming   PARTICIPATION LIMITATIONS: meal prep, cleaning, laundry, driving, shopping, community activity, occupation, and yard work   PERSONAL FACTORS 3+ comorbidities: See medical hx  are also affecting patient's functional outcome.        GOALS: Goals reviewed with patient? Yes   SHORT TERM GOALS: Target date: 02/06/2022     Pt will report understanding and adherence to initial HEP in order to promote independence in the management of primary impairments. Baseline: HEP provided at eval 01/25/2022: Pt reports adherence to his HEP Goal status: ACHIEVED     LONG TERM GOALS: Target date: 03/06/2022    Pt will achieve a QuickDASH score of 30% or less in order to demonstrate improved functional ability as it relates to his shoulder impairments. Baseline: 49% 01/30/2022: 38% Goal status: IN PROGRESS   2.  Pt  will demonstrate Rt shoulder elevation AROM of 150 degrees in order to get dressed with less limitation. Baseline: Flexion: 110, abduction: 102 01/25/2022: Flexion: 130, abduction: 119 02/06/2022: Flexion and abduction to 137 degrees Goal status: IN PROGRESS   3.  Pt will demonstrate ability to lift 10# overhead with Rt UE in order to return to working with less limitation. Baseline: Unable to lift any amount of weight overhead 02/06/2022: Pt demonstrate ability to lift 2# dumbbell overhead with Rt UE Goal status: IN PROGRESS   4.  Pt will achieve global Rt shoulder strength of 5/5 in order to return to working out independently with less limitation. Baseline: See MMT chart 02/06/2022: 5/5 globally Goal status: ACHIEVED   5.  Pt will report worst pain of 0-3/10 in order to complete household ADLs with less limitation. Baseline: worst pain 8/10 01/25/2022: worst pain 6/10  Goal status: IN PROGRESS       PLAN: PT FREQUENCY: 1-2x/week   PT DURATION: 8 weeks   PLANNED INTERVENTIONS: Therapeutic exercises, Therapeutic activity, Neuromuscular re-education, Patient/Family education, Self Care, Joint mobilization, Aquatic Therapy, Dry Needling, Electrical stimulation, Spinal manipulation, Spinal mobilization, Cryotherapy, Moist heat, scar mobilization, Taping, Vasopneumatic device, Biofeedback, Ionotophoresis 76m/ml Dexamethasone, Manual therapy, and Re-evaluation   PLAN FOR NEXT SESSION: Progress early shoulder ROM and strengthening in accordance with [facility] post-op protocol   YVanessa Millsap PT, DPT 02/06/22 2:40 PM

## 2022-02-08 ENCOUNTER — Ambulatory Visit: Payer: Medicaid Other

## 2022-02-08 DIAGNOSIS — G8929 Other chronic pain: Secondary | ICD-10-CM

## 2022-02-08 DIAGNOSIS — M25611 Stiffness of right shoulder, not elsewhere classified: Secondary | ICD-10-CM

## 2022-02-08 DIAGNOSIS — M6281 Muscle weakness (generalized): Secondary | ICD-10-CM

## 2022-02-08 DIAGNOSIS — M25511 Pain in right shoulder: Secondary | ICD-10-CM | POA: Diagnosis not present

## 2022-02-08 DIAGNOSIS — R600 Localized edema: Secondary | ICD-10-CM

## 2022-02-08 NOTE — Therapy (Signed)
OUTPATIENT PHYSICAL THERAPY TREATMENT NOTE   Patient Name: Matthew [name] MRN: [id number] DOB:1976-04-27, 46 y.o., male Today's Date: 02/08/2022  PCP: [name], MD REFERRING PROVIDER: Marchia Bond, MD  END OF SESSION:   PT End of Session - 02/08/22 1401     Visit Number 9    Number of Visits 17    Date for PT Re-Evaluation 03/14/22    Authorization Type [place] [employer]    Authorization Time Period 01/30/2022-03/12/2022    Authorization - Visit Number 4    Authorization - Number of Visits 12    PT Start Time 1401    PT Stop Time 1441    PT Time Calculation (min) 40 min    Activity Tolerance Patient tolerated treatment well    Behavior During Therapy WFL for tasks assessed/performed                  Past Medical History:  Diagnosis Date   Anxiety    Arthritis    Celiac disease    Coronary artery disease    Dairy allergy    Depression    Diabetes mellitus without complication ([place])    High cholesterol    Hypertension    Hypothyroidism    Myocardial infarction ([place])    Paranoia ([place])    Schizoaffective disorder ([place])    Past Surgical History:  Procedure Laterality Date   APPENDECTOMY     CARDIAC CATHETERIZATION     CORONARY STENT INTERVENTION N/A 06/06/2020   Procedure: CORONARY STENT INTERVENTION;  Surgeon: Martinique, Peter M, MD;  Location: [facility];  Service: Cardiovascular;  Laterality: N/A;  prox LAD, distal RCA   INTRAVASCULAR ULTRASOUND/IVUS N/A 06/06/2020   Procedure: Intravascular Ultrasound/IVUS;  Surgeon: Martinique, Peter M, MD;  Location: [facility];  Service: Cardiovascular;  Laterality: N/A;   LEFT HEART CATH AND CORONARY ANGIOGRAPHY N/A 06/06/2020   Procedure: LEFT HEART CATH AND CORONARY ANGIOGRAPHY;  Surgeon: Martinique, Peter M, MD;  Location: [facility];  Service: Cardiovascular;  Laterality: N/A;   LEFT HEART CATH AND CORONARY ANGIOGRAPHY N/A 02/24/2021   Procedure: LEFT HEART CATH AND CORONARY ANGIOGRAPHY;  Surgeon: Belva Crome, MD;  Location: [facility];  Service: Cardiovascular;  Laterality: N/A;   NASAL SEPTUM SURGERY     TOTAL SHOULDER ARTHROPLASTY Right 11/28/2021   Procedure: TOTAL SHOULDER ARTHROPLASTY;  Surgeon: Marchia Bond, MD;  Location: [facility];  Service: Orthopedics;  Laterality: Right;   WRIST FRACTURE SURGERY     right wrist   Patient Active Problem List   Diagnosis Date Noted   S/P shoulder replacement, right 11/28/2021   Atypical chest pain 04/23/2021   Syncope 04/23/2021   Chronic hyponatremia 04/23/2021   Leukocytosis 04/23/2021   Right rib fracture 04/23/2021   High cholesterol    Tobacco abuse    Unstable angina ([place]) 02/24/2021   Right-sided chest pain 09/11/2020   Coronary artery disease of bypass graft of native heart with stable angina pectoris ([place])    Uncontrolled type 2 diabetes mellitus with hyperglycemia ([place])    Hyponatremia    Essential hypertension    History of hyperprolactinemia 08/16/2020   Mixed dyslipidemia 08/15/2020   Chest pain 06/06/2020   Type 2 diabetes mellitus with hyperglycemia ([place]) 06/06/2020   Hyperkalemia 06/06/2020   Thrombocytopenia ([place]) 06/06/2020   Obesity (BMI 30.0-34.9) 06/06/2020   Non-ST elevation (NSTEMI) myocardial infarction [facility])    Attention deficit hyperactivity disorder (ADHD), combined type, moderate 03/10/2018   Generalized  anxiety disorder 03/10/2018   Schizoaffective disorder, bipolar type without good prognostic features ([place]) 02/09/2015   Morbid obesity ([place]) 01/18/2015   OSA (obstructive sleep apnea) 07/14/2014   Solitary pulmonary nodule 07/14/2014    REFERRING DIAG: s/p right shoulder replacement 11/28/2021  THERAPY DIAG:  Chronic right shoulder pain  Stiffness of right shoulder, not elsewhere classified  Localized edema  Muscle weakness (generalized)  Rationale for Evaluation and Treatment Rehabilitation  PERTINENT HISTORY: Rt TSA on 11/28/2021, HTN, hx of MI 05/2020, DMII, anxiety, depression,  paranoia, Schizoaffective disorder, coronary stint interventions in 2022  PRECAUTIONS: Shoulder, s/p Rt TSA 11/28/2021, utilizing [facility] orthopedic specialists TSA protocol in lieu of post-op protocol   SUBJECTIVE: Pt reports no pain today and adds that he has been working on his HEP daily.   PAIN:  Are you having pain? Yes: NPRS scale: 0/10 Pain location: Rt shoulder Pain description: Achy Aggravating factors: Rt shoulder flexion and abduction, laying on Rt side Relieving factors: pain medication, ice, and rest   OBJECTIVE: (objective measures completed at initial evaluation unless otherwise dated)   DIAGNOSTIC FINDINGS:  11/28/2021: DG Shoulder Right Port: IMPRESSION: Interval total right shoulder arthroplasty without evidence of hardware failure.   09/26/2021: MR Shoulder Right With Contrast: IMPRESSION: 1. Age advanced glenohumeral degenerative changes with diffuse chondral thinning and prominent osteophytes of the humeral head inferiorly. 2. No specific signs of recent glenohumeral dislocation. There is no evidence of Hill-Sachs deformity or anterior labroligamentous injury. The labrum is diffusely degenerated, especially posteriorly. 3. Intact rotator cuff with mild infraspinatus tendinosis.   PATIENT SURVEYS:  [name] 27/55, 49% disability  Quick Dash (01/30/2022): 21/55, 38% disability   COGNITION:           Overall cognitive status: Within functional limits for tasks assessed                                  SENSATION: Not tested   POSTURE: Forward head and rounded shoulders   UPPER EXTREMITY ROM:    A/PROM Right eval Left eval Right 01/25/2022 Right 02/06/2022 Right 02/08/2022  Shoulder flexion 110p! 180 130p! 137p!/ 140p! 155  Shoulder abduction 102p! 180 119p! 137p!/ 160 150  Shoulder internal rotation 45 70/80 45 57/70 58  Shoulder external rotation 40 77/90 40 50/55 52  (Blank rows = not tested)   UPPER EXTREMITY MMT:   MMT Right eval  Left eval Right 02/06/2022  Shoulder flexion 4+/5 5/5 5/5  Shoulder abduction 4+/5 5/5 5/5  Shoulder internal rotation 4/5 5/5 5/5  Shoulder external rotation 4/5 5/5 5/5  Middle trapezius 3/5 4/5   Lower trapezius Not tested 3/5   Latissimus dorsi 3/5 4/5   Elbow flexion 5/5 5/5   Elbow extension 5/5 5/5   Grip strength (lbs)       (Blank rows = not tested)     JOINT MOBILITY TESTING:  WNL GHJ mobility in all planes BIL   PALPATION:  No TTP about Rt shoulder surgical incisions             TODAY'S TREATMENT:   [facility] Adult PT Treatment:                                                DATE: 02/08/2022 Therapeutic Exercise: Standing BIL shoulder flexion AAROM with  black physioball at wall with PT perturbations at end range 3x30 seconds Standing BIL shoulder V lifts with black physioball 3x10 BIL Standing BIL shoulder ER with 3# cables 3x10 Standing BIL shoulder extension with 3# cables 3x10 Supine BIL shoulder flexion with 3# dumbbells 3x10 Seated low rows with 40# cable 2x10 Seated high rows with 40# cable 2x10 Seated serratus punch with 35# cable 3x10 Standing Rt shoulder AAROM with dowel and towel under arm 3x10 with 5-sec hold Standing Rt shoulder IR stretch with towel with instruction to not go past a gentle stretch 3x10 with 5-sec hold Manual Therapy: N/A Neuromuscular re-ed: N/A Therapeutic Activity: N/A Modalities: N/A Self Care: N/A   [facility] Adult PT Treatment:                                                DATE: 02/06/2022 Therapeutic Exercise: Seated BIL shoulder scaption to roughly 110 degrees with 2# dumbbell 3x10 Seated Rt shoulder scaption AAROM with dowel rod 2x10 with 5-sec holds Rt shoulder IR walkout with concentric contraction at end of walkout with GTB 2x10 Rt shoulder ER walkout with concentric contraction at end of walkout with RTB 2x10 Standing BIL biceps curls 3x12 with 7# dumbbells Supine BIL triceps extensions with 7# dumbbells with shoulders at  90 degrees flexion 3x10 Supine BIL shoulder flexion AAROM with dowel with 3# ankle weight 2x10 with 5-sec hold Standing Rt shoulder abduction AAROM with UE Ranger 2x10 with 5-sec hold Seated low rows with 35# cable 2x10 Manual Therapy: N/A Neuromuscular re-ed: N/A Therapeutic Activity: Re-assessment of objective measures with pt education Modalities: N/A Self Care: N/A   [facility] Adult PT Treatment:                                                DATE: 02/01/2022 Therapeutic Exercise: Rt shoulder IR walkout with concentric contraction at end of walkout with GTB 2x10 Standing shoulder rolls 2x10 forward and backward Seated low rows with 25# 3x10 Seated high rows with 20# 3x10 Standing BIL shoulder scaption AROM with cues to avoid shoulder shrug and trunk extension 3x15 Standing Rt shoulder flexion AAROM with UE Ranger 2x10 with 5-sec hold Standing Rt shoulder abduction AAROM with UE Ranger 2x10 with 5-sec hold Standing 6# dumbbell biceps curls 3x15 BIL Supine BIL triceps extensions with 6# dumbbells with shoulders at 90 degrees flexion 3x10 Sidelying 4# dumbbell Rt shoulder ER 3x10 with slow return Seated Rt shoulder ER AAROM with dowel 2x10 with 5-sec hold Manual Therapy: N/A Neuromuscular re-ed: N/A Therapeutic Activity: N/A Modalities: N/A Self Care: N/A      PATIENT EDUCATION: Education details: Pt educated on underlying pathophysiology, prognosis, protocol, POC, QuickDASH, and HEP Person educated: Patient and Parent Education method: Explanation, Demonstration, and Handouts Education comprehension: verbalized understanding and returned demonstration     HOME EXERCISE PROGRAM: Access Code: [email or web address] URL: [email or web address] Date: 01/23/2022 Prepared by: Evelene Croon  Exercises - Standing Shoulder Scaption 45-90 degrees  - 1 x daily - 7 x weekly - 3 sets - 10 reps - Seated Single Arm Elbow Flexion with Resistance  - 1 x daily - 7 x weekly - 2  sets - 10 reps - Shoulder External Rotation with Resistance  - 1 x daily -  7 x weekly - 3 sets - 10 reps - Standing Isometric Shoulder Internal Rotation at Doorway  - 1 x daily - 7 x weekly - 3 sets - 10 reps - 5 seconds hold  Patient Education - Scar Massage   ASSESSMENT:   CLINICAL IMPRESSION: Pt presents 10 weeks and 2 days s/p Rt TSA 11/28/2021. Pt continues to progress well with his rehab, and a few new exercises from phase III of the post-op rehab were implemented today, including weighted scaption lifts. He will continue to benefit from skilled PT to address his primary impairments and return to his prior level of function with less limitation.   OBJECTIVE IMPAIRMENTS decreased activity tolerance, decreased mobility, decreased ROM, decreased strength, hypomobility, increased edema, impaired flexibility, impaired UE functional use, improper body mechanics, postural dysfunction, and pain.    ACTIVITY LIMITATIONS carrying, lifting, sleeping, bathing, dressing, reach over head, and hygiene/grooming   PARTICIPATION LIMITATIONS: meal prep, cleaning, laundry, driving, shopping, community activity, occupation, and yard work   PERSONAL FACTORS 3+ comorbidities: See medical hx  are also affecting patient's functional outcome.        GOALS: Goals reviewed with patient? Yes   SHORT TERM GOALS: Target date: 02/06/2022     Pt will report understanding and adherence to initial HEP in order to promote independence in the management of primary impairments. Baseline: HEP provided at eval 01/25/2022: Pt reports adherence to his HEP Goal status: ACHIEVED     LONG TERM GOALS: Target date: 03/06/2022    Pt will achieve a QuickDASH score of 30% or less in order to demonstrate improved functional ability as it relates to his shoulder impairments. Baseline: 49% 01/30/2022: 38% Goal status: IN PROGRESS   2.  Pt will demonstrate Rt shoulder elevation AROM of 150 degrees in order to get dressed with  less limitation. Baseline: Flexion: 110, abduction: 102 01/25/2022: Flexion: 130, abduction: 119 02/06/2022: Flexion and abduction to 137 degrees Goal status: IN PROGRESS   3.  Pt will demonstrate ability to lift 10# overhead with Rt UE in order to return to working with less limitation. Baseline: Unable to lift any amount of weight overhead 02/06/2022: Pt demonstrate ability to lift 2# dumbbell overhead with Rt UE Goal status: IN PROGRESS   4.  Pt will achieve global Rt shoulder strength of 5/5 in order to return to working out independently with less limitation. Baseline: See MMT chart 02/06/2022: 5/5 globally Goal status: ACHIEVED   5.  Pt will report worst pain of 0-3/10 in order to complete household ADLs with less limitation. Baseline: worst pain 8/10 01/25/2022: worst pain 6/10  Goal status: IN PROGRESS       PLAN: PT FREQUENCY: 1-2x/week   PT DURATION: 8 weeks   PLANNED INTERVENTIONS: Therapeutic exercises, Therapeutic activity, Neuromuscular re-education, Patient/Family education, Self Care, Joint mobilization, Aquatic Therapy, Dry Needling, Electrical stimulation, Spinal manipulation, Spinal mobilization, Cryotherapy, Moist heat, scar mobilization, Taping, Vasopneumatic device, Biofeedback, Ionotophoresis 22m/ml Dexamethasone, Manual therapy, and Re-evaluation   PLAN FOR NEXT SESSION: Progress early shoulder ROM and strengthening in accordance with [facility] post-op protocol   YVanessa Dubois PT, DPT 02/08/22 2:42 PM

## 2022-02-13 ENCOUNTER — Ambulatory Visit (INDEPENDENT_AMBULATORY_CARE_PROVIDER_SITE_OTHER): Payer: Self-pay | Admitting: Physician Assistant

## 2022-02-13 ENCOUNTER — Encounter: Payer: Self-pay | Admitting: Physician Assistant

## 2022-02-13 DIAGNOSIS — F99 Mental disorder, not otherwise specified: Secondary | ICD-10-CM

## 2022-02-13 DIAGNOSIS — F5105 Insomnia due to other mental disorder: Secondary | ICD-10-CM

## 2022-02-13 DIAGNOSIS — F17200 Nicotine dependence, unspecified, uncomplicated: Secondary | ICD-10-CM

## 2022-02-13 DIAGNOSIS — F411 Generalized anxiety disorder: Secondary | ICD-10-CM

## 2022-02-13 DIAGNOSIS — F250 Schizoaffective disorder, bipolar type: Secondary | ICD-10-CM

## 2022-02-13 DIAGNOSIS — E032 Hypothyroidism due to medicaments and other exogenous substances: Secondary | ICD-10-CM

## 2022-02-13 DIAGNOSIS — R6889 Other general symptoms and signs: Secondary | ICD-10-CM

## 2022-02-13 MED ORDER — ATOMOXETINE HCL 60 MG PO CAPS: 60.0000 mg | ORAL_CAPSULE | Freq: Every day | ORAL | 1 refills | Status: DC

## 2022-02-13 NOTE — Progress Notes (Signed)
[facility]  Patient ID: Matthew [name],  MRN: [id number]  PCP: [name], MD  Date of Evaluation: 02/13/2022  time spent:30 minutes  Chief Complaint:  Chief Complaint   Anxiety; Depression; Insomnia; Follow-up    HISTORY/CURRENT STATUS: For routine f/u.   He went back on Strattera, not able to focus well at all.  On current dose for about a month, ask if the dose can be increased.  Currently not working or looking for a job, due to physical and mental health reasons.  He was put on Lyrica for back and shoulder pain.  No longer seeing the orthopedist.  He was on gabapentin originally for neuropathy but also for anxiety.  He stopped the gabapentin when he started Lyrica.  He asked if I will prescribe that now instead of the gabapentin.  It helps the anxiety more than the gabapentin does.  He is not having panic attacks but often feels a generalized sense of unease, like something bad may happen at any time.  He takes Klonopin for rescue and it is effective.  Patient is able to enjoy things.  Has been more tired lately, not really sure why.  Had recent labs, see on chart.  Feels like his meds for depression are working well.  No extreme sadness, tearfulness, or feelings of hopelessness.  Sleeps well most of the time.  Trazodone usually helps.  ADLs and personal hygiene are normal.  Appetite has not changed.  Weight is stable.  Not isolating.  Not crying easily.  Denies suicidal or homicidal thoughts.  Patient denies increased energy with decreased need for sleep, increased talkativeness, racing thoughts, impulsivity or risky behaviors, increased spending, increased libido, grandiosity, increased irritability or anger, paranoia, or hallucinations.  Denies dizziness, syncope, seizures, numbness, tingling, tremor, tics, unsteady gait, slurred speech, confusion.  Has chronic low back pain.  No abnormal muscle movements other than side-to-side mandibular motion.  This is not new,  he states it is secondary to TMJ problems.  Individual Medical History/ Review of Systems: Changes? :No      Past medications for mental health diagnoses include: Risperdal, Zyprexa, Abilify, [name], Klonopin, Gabapentin, Xanax, Valium, Ativan, Hydroxyzine, Trilafon, Atenolol, Cymbalta, Prozac, Lexapro, Paxil, Lithium, Adderall, Ritalin, Mirtazepine, Sonata was ineffective.  Allergies: Gluten meal and Lactose intolerance (gi)  Current Medications:  Current Outpatient Medications:    aspirin EC 81 MG tablet, Take 1 tablet (81 mg total) by mouth daily. Swallow whole., Disp: 90 tablet, Rfl: 3   atomoxetine (STRATTERA) 60 MG capsule, Take 1 capsule (60 mg total) by mouth daily., Disp: 30 capsule, Rfl: 1   atorvastatin (LIPITOR) 80 MG tablet, Take 1 tablet (80 mg total) by mouth every morning., Disp: 90 tablet, Rfl: 3   Boswellia-Glucosamine-Vit D (OSTEO BI-FLEX ONE PER DAY) TABS, Take 1 tablet by mouth daily., Disp: , Rfl:    carvedilol (COREG) 25 MG tablet, Take 1.5 tablets (37.5 mg total) by mouth 2 (two) times daily., Disp: 270 tablet, Rfl: 3   clonazePAM (KLONOPIN) 1 MG tablet, Take 1 tablet (1 mg total) by mouth 2 (two) times daily as needed for anxiety., Disp: 60 tablet, Rfl: 2   clopidogrel (PLAVIX) 75 MG tablet, Take 1 tablet (75 mg total) by mouth daily., Disp: 90 tablet, Rfl: 3   Coenzyme Q10 (CO Q-10 PO), Take 1 capsule by mouth daily., Disp: , Rfl:    DULoxetine (CYMBALTA) 30 MG capsule, Take 1 capsule (30 mg total) by mouth daily. Take 1 with the 60 mg=90  mg daily, Disp: 90 capsule, Rfl: 0   DULoxetine (CYMBALTA) 60 MG capsule, TAKE 1 CAPSULE (60 MG TOTAL) BY MOUTH DAILY. TAKE WITH 30 MG=90 MG DAILY., Disp: 90 capsule, Rfl: 0   empagliflozin (JARDIANCE) 25 MG TABS tablet, Take 1 tablet (25 mg total) by mouth daily before breakfast., Disp: 90 tablet, Rfl: 3   fenofibrate (TRICOR) 145 MG tablet, Take 1 tablet (145 mg total) by mouth daily., Disp: 90 tablet, Rfl: 3   icosapent Ethyl  (VASCEPA) 1 g capsule, Take 2 capsules (2 g total) by mouth 2 (two) times daily., Disp: 360 capsule, Rfl: 3   insulin glargine (LANTUS SOLOSTAR) 100 UNIT/ML Solostar Pen, Inject 20 Units into the skin daily., Disp: 30 mL, Rfl: 3   Insulin Pen Needle 32G X 4 MM MISC, 1 Device by Does not apply route daily., Disp: 150 each, Rfl: 3   levothyroxine (SYNTHROID) 25 MCG tablet, TAKE 1 TABLET IN THE MORNING WITH A GLASS OF WATER, WAIT 1 HOUR BEFORE TAKING OTHER MEDS/EATING., Disp: 30 tablet, Rfl: 1   lithium carbonate (LITHOBID) 300 MG CR tablet, TAKE 2 TABLETS BY MOUTH TWICE A DAY, Disp: 360 tablet, Rfl: 0   metFORMIN (GLUCOPHAGE) 1000 MG tablet, Take 1 tablet (1,000 mg total) by mouth 2 (two) times daily with a meal., Disp: 180 tablet, Rfl: 3   perphenazine (TRILAFON) 16 MG tablet, Take 16 mg by mouth 2 (two) times daily., Disp: , Rfl:    pregabalin (LYRICA) 100 MG capsule, Take 100 mg by mouth 2 (two) times daily., Disp: , Rfl:    Pseudoephedrine HCl (SUDAFED 12 HOUR PO), Take 1 tablet by mouth daily as needed (congestion)., Disp: , Rfl:    traZODone (DESYREL) 100 MG tablet, Take 3 tablets (300 mg total) by mouth at bedtime., Disp: 270 tablet, Rfl: 0   TURMERIC CURCUMIN PO, Take 1 capsule by mouth daily., Disp: , Rfl:    HYDROcodone-acetaminophen (NORCO) 10-325 MG tablet, Take 1 tablet by mouth every 4 (four) hours as needed. (Patient not taking: Reported on 12/13/2021), Disp: 28 tablet, Rfl: 0   nitroGLYCERIN (NITROSTAT) 0.4 MG SL tablet, Place 1 tablet (0.4 mg total) under the tongue every 5 (five) minutes as needed for chest pain., Disp: 100 tablet, Rfl: 3   ondansetron (ZOFRAN) 4 MG tablet, Take 1 tablet (4 mg total) by mouth every 8 (eight) hours as needed for nausea or vomiting. (Patient not taking: Reported on 12/13/2021), Disp: 10 tablet, Rfl: 0   sennosides-docusate sodium (SENOKOT-S) 8.6-50 MG tablet, Take 2 tablets by mouth daily. (Patient not taking: Reported on 12/13/2021), Disp: 30 tablet, Rfl:  1   spironolactone (ALDACTONE) 25 MG tablet, Take 0.5 tablets (12.5 mg total) by mouth daily. (Patient not taking: Reported on 10/13/2021), Disp: 45 tablet, Rfl: 3 Medication Side Effects: none  Family Medical/ Social History: Changes? no   MENTAL HEALTH EXAM:  There were no vitals taken for this visit.There is no height or weight on file to calculate BMI.  General Appearance: Casual, Well Groomed, and Obese  Eye Contact:  Fair  Speech:  Clear and Coherent and Normal Rate  Volume:  Normal  Mood:  Euthymic  Affect:  Congruent  Thought Process:  Goal Directed and Descriptions of Associations: Circumstantial  Orientation:  Full (Time, Place, and Person)  Thought Content: Logical   Suicidal Thoughts:  No  Homicidal Thoughts:  No  Memory:  WNL  Judgement:  Fair  Insight:  Fair  Psychomotor Activity: Lateral mandibular motion  Concentration:  Concentration: Good and Attention Span: Fair  Recall:  Good  Fund of Knowledge: Good  Language: NA  Assets:  Desire for Improvement  ADL's:  Intact  Cognition: WNL  Prognosis:  Fair   Labs  01/23/2022 TSH 2.02  01/29/2022 Lipids TC 115, Trig 204, HDL 27, LDL 54  11/02/2021 Total cholesterol 105, triglycerides 416, HDL 24, LDL 23  DIAGNOSES:    ICD-10-CM   1. Generalized anxiety disorder  F41.1     2. Schizoaffective disorder, bipolar type ([place])  F25.0     3. Insomnia due to other mental disorder  F51.05    F99     4. Smoker  F17.200     5. Abnormal movement of jaw  R68.89     6. Hypothyroidism due to medication  E03.2       Receiving Psychotherapy: No   RECOMMENDATIONS:  PDMP reviewed.  Klonopin filled 02/09/2022.  Also on Lyrica and hydrocodone known to me. I provided 30 minutes of face to face time during this encounter, including time spent before and after the visit in records review, medical decision making, counseling pertinent to today's visit, and charting.   We discussed the Strattera.  It is fine to increase the  dose.  Recommend he try to get out and do something so that he will not be stuck at home all the time, that can help focus as well. Also discussed gabapentin.  Okay to change to Lyrica, he has a supply of gabapentin at home and will bring that into the office for [place] to dispose of and then I will prescribe Lyrica for anxiety. Again discussed to the abnormal jaw movements.  He maintains that it is secondary to TMJ issues, not tardive dyskinesia and he does not want to take Ingrezza or Austedo for it. Smoking cessation discussed.  Increase Strattera to 60 mg, 1 p.o. daily. Continue Klonopin 1 mg, 1 p.o. twice daily as needed. Continue Cymbalta 90 mg daily.  Continue levothyroxine 25 mg every morning. Continue lithium CR 300 mg, 4 qhs. Continue perphenazine 16 mg, 1 po bid. Continue Lyrica 100 mg, 1 p.o. twice daily. Continue trazodone 100 mg, 3 p.o. nightly as needed sleep. Return in 4 weeks.  Donnal Moat, PA-C

## 2022-02-15 ENCOUNTER — Ambulatory Visit: Payer: Medicaid Other

## 2022-02-20 ENCOUNTER — Other Ambulatory Visit: Payer: Self-pay | Admitting: Physician Assistant

## 2022-02-20 ENCOUNTER — Ambulatory Visit: Payer: Medicaid Other | Attending: Family Medicine

## 2022-02-20 DIAGNOSIS — M25511 Pain in right shoulder: Secondary | ICD-10-CM | POA: Insufficient documentation

## 2022-02-20 DIAGNOSIS — G8929 Other chronic pain: Secondary | ICD-10-CM | POA: Insufficient documentation

## 2022-02-20 DIAGNOSIS — R600 Localized edema: Secondary | ICD-10-CM | POA: Diagnosis present

## 2022-02-20 DIAGNOSIS — M6281 Muscle weakness (generalized): Secondary | ICD-10-CM | POA: Insufficient documentation

## 2022-02-20 DIAGNOSIS — M25611 Stiffness of right shoulder, not elsewhere classified: Secondary | ICD-10-CM | POA: Diagnosis present

## 2022-02-20 DIAGNOSIS — F250 Schizoaffective disorder, bipolar type: Secondary | ICD-10-CM

## 2022-02-20 NOTE — Therapy (Signed)
OUTPATIENT PHYSICAL THERAPY TREATMENT NOTE   Patient Name: Matthew [name] MRN: [id number] DOB:1975/07/04, 46 y.o., male Today's Date: 02/20/2022  PCP: Matthew Landsman, MD REFERRING PROVIDER: Marchia Bond, MD  END OF SESSION:   PT End of Session - 02/20/22 1405     Visit Number 10    Number of Visits 17    Date for PT Re-Evaluation 03/14/22    Authorization Type [place] [employer]    Authorization Time Period 01/30/2022-03/12/2022    Authorization - Visit Number 5    Authorization - Number of Visits 12    PT Start Time 4801    PT Stop Time 1444    PT Time Calculation (min) 39 min    Activity Tolerance Patient tolerated treatment well    Behavior During Therapy WFL for tasks assessed/performed                   Past Medical History:  Diagnosis Date   Anxiety    Arthritis    Celiac disease    Coronary artery disease    Dairy allergy    Depression    Diabetes mellitus without complication ([place])    High cholesterol    Hypertension    Hypothyroidism    Myocardial infarction ([place])    Paranoia ([place])    Schizoaffective disorder ([place])    Past Surgical History:  Procedure Laterality Date   APPENDECTOMY     CARDIAC CATHETERIZATION     CORONARY STENT INTERVENTION N/A 06/06/2020   Procedure: CORONARY STENT INTERVENTION;  Surgeon: Martinique, Peter M, MD;  Location: [place] [facility];  Service: Cardiovascular;  Laterality: N/A;  prox LAD, distal RCA   INTRAVASCULAR ULTRASOUND/IVUS N/A 06/06/2020   Procedure: Intravascular Ultrasound/IVUS;  Surgeon: Martinique, Peter M, MD;  Location: [place] [facility];  Service: Cardiovascular;  Laterality: N/A;   LEFT HEART CATH AND CORONARY ANGIOGRAPHY N/A 06/06/2020   Procedure: LEFT HEART CATH AND CORONARY ANGIOGRAPHY;  Surgeon: Martinique, Peter M, MD;  Location: [facility];  Service: Cardiovascular;  Laterality: N/A;   LEFT HEART CATH AND CORONARY ANGIOGRAPHY N/A 02/24/2021   Procedure: LEFT HEART CATH AND CORONARY ANGIOGRAPHY;  Surgeon:  Matthew Crome, MD;  Location: [facility];  Service: Cardiovascular;  Laterality: N/A;   NASAL SEPTUM SURGERY     TOTAL SHOULDER ARTHROPLASTY Right 11/28/2021   Procedure: TOTAL SHOULDER ARTHROPLASTY;  Surgeon: Matthew Bond, MD;  Location: [facility];  Service: Orthopedics;  Laterality: Right;   WRIST FRACTURE SURGERY     right wrist   Patient Active Problem List   Diagnosis Date Noted   S/P shoulder replacement, right 11/28/2021   Atypical chest pain 04/23/2021   Syncope 04/23/2021   Chronic hyponatremia 04/23/2021   Leukocytosis 04/23/2021   Right rib fracture 04/23/2021   High cholesterol    Tobacco abuse    Unstable angina ([place]) 02/24/2021   Right-sided chest pain 09/11/2020   Coronary artery disease of bypass graft of native heart with stable angina pectoris ([place])    Uncontrolled type 2 diabetes mellitus with hyperglycemia ([place])    Hyponatremia    Essential hypertension    History of hyperprolactinemia 08/16/2020   Mixed dyslipidemia 08/15/2020   Chest pain 06/06/2020   Type 2 diabetes mellitus with hyperglycemia ([place]) 06/06/2020   Hyperkalemia 06/06/2020   Thrombocytopenia ([place]) 06/06/2020   Obesity (BMI 30.0-34.9) 06/06/2020   Non-ST elevation (NSTEMI) myocardial infarction [facility])    Attention deficit hyperactivity disorder (ADHD), combined type, moderate 03/10/2018  Generalized anxiety disorder 03/10/2018   Schizoaffective disorder, bipolar type without good prognostic features ([place]) 02/09/2015   Morbid obesity ([place]) 01/18/2015   OSA (obstructive sleep apnea) 07/14/2014   Solitary pulmonary nodule 07/14/2014    REFERRING DIAG: s/p right shoulder replacement 11/28/2021  THERAPY DIAG:  Chronic right shoulder pain  Stiffness of right shoulder, not elsewhere classified  Localized edema  Muscle weakness (generalized)  Rationale for Evaluation and Treatment Rehabilitation  PERTINENT HISTORY: Rt TSA on 11/28/2021, HTN, hx of MI 05/2020, DMII, anxiety,  depression, paranoia, Schizoaffective disorder, coronary stint interventions in 2022  PRECAUTIONS: Shoulder, s/p Rt TSA 11/28/2021, utilizing [facility] orthopedic specialists TSA protocol in lieu of post-op protocol   SUBJECTIVE:Pt denies any pain currently and adds that he has been adherent to his HEP.  PAIN:  Are you having pain? Yes: NPRS scale: 0/10 Pain location: Rt shoulder Pain description: Achy Aggravating factors: Rt shoulder flexion and abduction, laying on Rt side Relieving factors: pain medication, ice, and rest   OBJECTIVE: (objective measures completed at initial evaluation unless otherwise dated)   DIAGNOSTIC FINDINGS:  11/28/2021: DG Shoulder Right Port: IMPRESSION: Interval total right shoulder arthroplasty without evidence of hardware failure.   09/26/2021: MR Shoulder Right With Contrast: IMPRESSION: 1. Age advanced glenohumeral degenerative changes with diffuse chondral thinning and prominent osteophytes of the humeral head inferiorly. 2. No specific signs of recent glenohumeral dislocation. There is no evidence of Hill-Sachs deformity or anterior labroligamentous injury. The labrum is diffusely degenerated, especially posteriorly. 3. Intact rotator cuff with mild infraspinatus tendinosis.   PATIENT SURVEYS:  Matthew [name] 27/55, 49% disability  Quick Dash (01/30/2022): 21/55, 38% disability  Quick Dash (02/20/2022): 17/55, 31% disability   COGNITION:           Overall cognitive status: Within functional limits for tasks assessed                                  SENSATION: Not tested   POSTURE: Forward head and rounded shoulders   UPPER EXTREMITY ROM:    A/PROM Right eval Left eval Right 01/25/2022 Right 02/06/2022 Right 02/08/2022 Right 02/20/2022  Shoulder flexion 110p! 180 130p! 137p!/ 140p! 155 152  Shoulder abduction 102p! 180 119p! 137p!/ 160 150 155  Shoulder internal rotation 45 70/80 45 57/70 58 65  Shoulder external rotation 40 77/90 40  50/55 52 46  (Blank rows = not tested)   UPPER EXTREMITY MMT:   MMT Right eval Left eval Right 02/06/2022  Shoulder flexion 4+/5 5/5 5/5  Shoulder abduction 4+/5 5/5 5/5  Shoulder internal rotation 4/5 5/5 5/5  Shoulder external rotation 4/5 5/5 5/5  Middle trapezius 3/5 4/5   Lower trapezius Not tested 3/5   Latissimus dorsi 3/5 4/5   Elbow flexion 5/5 5/5   Elbow extension 5/5 5/5   Grip strength (lbs)       (Blank rows = not tested)     JOINT MOBILITY TESTING:  WNL GHJ mobility in all planes BIL   PALPATION:  No TTP about Rt shoulder surgical incisions             TODAY'S TREATMENT:   [facility] Adult PT Treatment:  DATE: 02/20/2022 Therapeutic Exercise: Standing Rt shoulder IR stretch with towel with instruction to not go past a gentle stretch 3x10 with 5-sec hold Seated Rt shoulder ER stretch with bolster at table 3x30sec Bent-over Rt shoulder pendulums with 3# dumbbell 3x30sec Standing BIL shoulder flexion AAROM with black physioball at wall 3x10 with 5-sec holds Standing BIL shoulder extension with 7# cables 3x10 Manual Therapy: N/A Neuromuscular re-ed: N/A Therapeutic Activity: Re-administration of QuickDASH with pt education Re-assessment of objective measures with pt education Standing scaption stepwise lift from counter top to chest-level shelf, then to overhead shelf, and back again 2x10 on Rt with 3# dumbbell Modalities: N/A Self Care: N/A   [facility] Adult PT Treatment:                                                DATE: 02/08/2022 Therapeutic Exercise: Standing BIL shoulder flexion AAROM with black physioball at wall with PT perturbations at end range 3x30 seconds Standing BIL shoulder V lifts with black physioball 3x10 BIL Standing BIL shoulder ER with 3# cables 3x10 Standing BIL shoulder extension with 3# cables 3x10 Supine BIL shoulder flexion with 3# dumbbells 3x10 Seated low rows with 40# cable 2x10 Seated  high rows with 40# cable 2x10 Seated serratus punch with 35# cable 3x10 Standing Rt shoulder AAROM with dowel and towel under arm 3x10 with 5-sec hold Standing Rt shoulder IR stretch with towel with instruction to not go past a gentle stretch 3x10 with 5-sec hold Manual Therapy: N/A Neuromuscular re-ed: N/A Therapeutic Activity: N/A Modalities: N/A Self Care: N/A   [facility] Adult PT Treatment:                                                DATE: 02/06/2022 Therapeutic Exercise: Seated BIL shoulder scaption to roughly 110 degrees with 2# dumbbell 3x10 Seated Rt shoulder scaption AAROM with dowel rod 2x10 with 5-sec holds Rt shoulder IR walkout with concentric contraction at end of walkout with GTB 2x10 Rt shoulder ER walkout with concentric contraction at end of walkout with RTB 2x10 Standing BIL biceps curls 3x12 with 7# dumbbells Supine BIL triceps extensions with 7# dumbbells with shoulders at 90 degrees flexion 3x10 Supine BIL shoulder flexion AAROM with dowel with 3# ankle weight 2x10 with 5-sec hold Standing Rt shoulder abduction AAROM with UE Ranger 2x10 with 5-sec hold Seated low rows with 35# cable 2x10 Manual Therapy: N/A Neuromuscular re-ed: N/A Therapeutic Activity: Re-assessment of objective measures with pt education Modalities: N/A Self Care: N/A     PATIENT EDUCATION: Education details: Pt educated on underlying pathophysiology, prognosis, protocol, POC, QuickDASH, and HEP Person educated: Patient and Parent Education method: Explanation, Demonstration, and Handouts Education comprehension: verbalized understanding and returned demonstration     HOME EXERCISE PROGRAM: Access Code: [email or web address] URL: [email or web address] Date: 01/23/2022 Prepared by: Evelene Croon  Exercises - Standing Shoulder Scaption 45-90 degrees  - 1 x daily - 7 x weekly - 3 sets - 10 reps - Seated Single Arm Elbow Flexion with Resistance  - 1 x daily - 7 x weekly - 2  sets - 10 reps - Shoulder External Rotation with Resistance  - 1 x daily - 7 x weekly - 3 sets - 10 reps -  Standing Isometric Shoulder Internal Rotation at Doorway  - 1 x daily - 7 x weekly - 3 sets - 10 reps - 5 seconds hold  Patient Education - Scar Massage   ASSESSMENT:   CLINICAL IMPRESSION: Pt presents 12 weeks s/p Rt TSA 11/28/2021. Pt is progressing well to phase III of his rehab protocol, although he remains limited in Rt shoulder elevation and ER ROM. Pt will continue to benefit from skilled PT to address his primary impairments and return to his prior level of function with less limitation.   OBJECTIVE IMPAIRMENTS decreased activity tolerance, decreased mobility, decreased ROM, decreased strength, hypomobility, increased edema, impaired flexibility, impaired UE functional use, improper body mechanics, postural dysfunction, and pain.    ACTIVITY LIMITATIONS carrying, lifting, sleeping, bathing, dressing, reach over head, and hygiene/grooming   PARTICIPATION LIMITATIONS: meal prep, cleaning, laundry, driving, shopping, community activity, occupation, and yard work   PERSONAL FACTORS 3+ comorbidities: See medical hx  are also affecting patient's functional outcome.        GOALS: Goals reviewed with patient? Yes   SHORT TERM GOALS: Target date: 02/06/2022     Pt will report understanding and adherence to initial HEP in order to promote independence in the management of primary impairments. Baseline: HEP provided at eval 01/25/2022: Pt reports adherence to his HEP Goal status: ACHIEVED     LONG TERM GOALS: Target date: 03/06/2022    Pt will achieve a QuickDASH score of 30% or less in order to demonstrate improved functional ability as it relates to his shoulder impairments. Baseline: 49% 01/30/2022: 38% 02/20/2022: 31% Goal status: NEARLY MET   2.  Pt will demonstrate Rt shoulder elevation AROM of 150 degrees in order to get dressed with less limitation. Baseline: Flexion:  110, abduction: 102 01/25/2022: Flexion: 130, abduction: 119 02/06/2022: Flexion and abduction to 137 degrees 02/20/2022: Flexion 152, abduction 155 Goal status: ACHIEVED   3.  Pt will demonstrate ability to lift 10# overhead with Rt UE in order to return to working with less limitation. Baseline: Unable to lift any amount of weight overhead 02/06/2022: Pt demonstrate ability to lift 2# dumbbell overhead with Rt UE Goal status: IN PROGRESS   4.  Pt will achieve global Rt shoulder strength of 5/5 in order to return to working out independently with less limitation. Baseline: See MMT chart 02/06/2022: 5/5 globally Goal status: ACHIEVED   5.  Pt will report worst pain of 0-3/10 in order to complete household ADLs with less limitation. Baseline: worst pain 8/10 01/25/2022: worst pain 6/10  02/20/2022: Worst pain 2/10 Goal status: ACHIEVED       PLAN: PT FREQUENCY: 1-2x/week   PT DURATION: 8 weeks   PLANNED INTERVENTIONS: Therapeutic exercises, Therapeutic activity, Neuromuscular re-education, Patient/Family education, Self Care, Joint mobilization, Aquatic Therapy, Dry Needling, Electrical stimulation, Spinal manipulation, Spinal mobilization, Cryotherapy, Moist heat, scar mobilization, Taping, Vasopneumatic device, Biofeedback, Ionotophoresis 42m/ml Dexamethasone, Manual therapy, and Re-evaluation   PLAN FOR NEXT SESSION: Progress early shoulder ROM and strengthening in accordance with [facility] post-op protocol   YVanessa Marbury PT, DPT 02/20/22 2:45 PM

## 2022-02-21 ENCOUNTER — Telehealth: Payer: Self-pay | Admitting: Physician Assistant

## 2022-02-21 ENCOUNTER — Other Ambulatory Visit: Payer: Self-pay | Admitting: Physician Assistant

## 2022-02-21 MED ORDER — PREGABALIN 100 MG PO CAPS
100.0000 mg | ORAL_CAPSULE | Freq: Two times a day (BID) | ORAL | 1 refills | Status: DC
Start: 2022-02-21 — End: 2022-04-20

## 2022-02-21 NOTE — Telephone Encounter (Signed)
Please let him know I sent in the prescription for Lyrica.  Thanks.

## 2022-02-21 NOTE — Telephone Encounter (Signed)
Pt informed

## 2022-02-21 NOTE — Progress Notes (Signed)
Patient returned gabapentin for proper disposal, as we discussed.  I sent in a prescription for Lyrica.

## 2022-02-22 ENCOUNTER — Ambulatory Visit: Payer: Medicaid Other

## 2022-02-22 DIAGNOSIS — M25611 Stiffness of right shoulder, not elsewhere classified: Secondary | ICD-10-CM

## 2022-02-22 DIAGNOSIS — G8929 Other chronic pain: Secondary | ICD-10-CM

## 2022-02-22 DIAGNOSIS — M25511 Pain in right shoulder: Secondary | ICD-10-CM | POA: Diagnosis not present

## 2022-02-22 DIAGNOSIS — M6281 Muscle weakness (generalized): Secondary | ICD-10-CM

## 2022-02-22 DIAGNOSIS — R600 Localized edema: Secondary | ICD-10-CM

## 2022-02-22 NOTE — Therapy (Signed)
OUTPATIENT PHYSICAL THERAPY TREATMENT NOTE   Patient Name: Matthew [name] MRN: [id number] DOB:Jul 05, 1975, 46 y.o., male Today's Date: 02/22/2022  PCP: [name], MD REFERRING PROVIDER: Marchia Bond, MD  END OF SESSION:   PT End of Session - 02/22/22 1402     Visit Number 11    Number of Visits 17    Date for PT Re-Evaluation 03/14/22    Authorization Type [place] [employer]    Authorization Time Period 01/30/2022-03/12/2022    Authorization - Visit Number 6    Authorization - Number of Visits 12    PT Start Time 3953    PT Stop Time 2023    PT Time Calculation (min) 40 min    Activity Tolerance Patient tolerated treatment well    Behavior During Therapy WFL for tasks assessed/performed                    Past Medical History:  Diagnosis Date   Anxiety    Arthritis    Celiac disease    Coronary artery disease    Dairy allergy    Depression    Diabetes mellitus without complication ([place])    High cholesterol    Hypertension    Hypothyroidism    Myocardial infarction ([place])    Paranoia ([place])    Schizoaffective disorder ([place])    Past Surgical History:  Procedure Laterality Date   APPENDECTOMY     CARDIAC CATHETERIZATION     CORONARY STENT INTERVENTION N/A 06/06/2020   Procedure: CORONARY STENT INTERVENTION;  Surgeon: Martinique, Peter M, MD;  Location: [facility];  Service: Cardiovascular;  Laterality: N/A;  prox LAD, distal RCA   INTRAVASCULAR ULTRASOUND/IVUS N/A 06/06/2020   Procedure: Intravascular Ultrasound/IVUS;  Surgeon: Martinique, Peter M, MD;  Location: [facility];  Service: Cardiovascular;  Laterality: N/A;   LEFT HEART CATH AND CORONARY ANGIOGRAPHY N/A 06/06/2020   Procedure: LEFT HEART CATH AND CORONARY ANGIOGRAPHY;  Surgeon: Martinique, Peter M, MD;  Location: [facility];  Service: Cardiovascular;  Laterality: N/A;   LEFT HEART CATH AND CORONARY ANGIOGRAPHY N/A 02/24/2021   Procedure: LEFT HEART CATH AND CORONARY ANGIOGRAPHY;  Surgeon:  Belva Crome, MD;  Location: [facility];  Service: Cardiovascular;  Laterality: N/A;   NASAL SEPTUM SURGERY     TOTAL SHOULDER ARTHROPLASTY Right 11/28/2021   Procedure: TOTAL SHOULDER ARTHROPLASTY;  Surgeon: Marchia Bond, MD;  Location: [facility];  Service: Orthopedics;  Laterality: Right;   WRIST FRACTURE SURGERY     right wrist   Patient Active Problem List   Diagnosis Date Noted   S/P shoulder replacement, right 11/28/2021   Atypical chest pain 04/23/2021   Syncope 04/23/2021   Chronic hyponatremia 04/23/2021   Leukocytosis 04/23/2021   Right rib fracture 04/23/2021   High cholesterol    Tobacco abuse    Unstable angina ([place]) 02/24/2021   Right-sided chest pain 09/11/2020   Coronary artery disease of bypass graft of native heart with stable angina pectoris ([place])    Uncontrolled type 2 diabetes mellitus with hyperglycemia ([place])    Hyponatremia    Essential hypertension    History of hyperprolactinemia 08/16/2020   Mixed dyslipidemia 08/15/2020   Chest pain 06/06/2020   Type 2 diabetes mellitus with hyperglycemia ([place]) 06/06/2020   Hyperkalemia 06/06/2020   Thrombocytopenia ([place]) 06/06/2020   Obesity (BMI 30.0-34.9) 06/06/2020   Non-ST elevation (NSTEMI) myocardial infarction [facility])    Attention deficit hyperactivity disorder (ADHD), combined type, moderate 03/10/2018  Generalized anxiety disorder 03/10/2018   Schizoaffective disorder, bipolar type without good prognostic features ([place]) 02/09/2015   Morbid obesity ([place]) 01/18/2015   OSA (obstructive sleep apnea) 07/14/2014   Solitary pulmonary nodule 07/14/2014    REFERRING DIAG: s/p right shoulder replacement 11/28/2021  THERAPY DIAG:  Chronic right shoulder pain  Stiffness of right shoulder, not elsewhere classified  Localized edema  Muscle weakness (generalized)  Rationale for Evaluation and Treatment Rehabilitation  PERTINENT HISTORY: Rt TSA on 11/28/2021, HTN, hx of MI 05/2020, DMII, anxiety,  depression, paranoia, Schizoaffective disorder, coronary stint interventions in 2022  PRECAUTIONS: Shoulder, s/p Rt TSA 11/28/2021, utilizing [facility] orthopedic specialists TSA protocol in lieu of post-op protocol   SUBJECTIVE: Pt reports some residual soreness from scaption lifts two days ago in clinic, but otherwise is feeling well. He reports adherence to his HEP.  PAIN:  Are you having pain? Yes: NPRS scale: 4/10 Pain location: Rt shoulder Pain description: Achy Aggravating factors: Rt shoulder flexion and abduction, laying on Rt side Relieving factors: pain medication, ice, and rest   OBJECTIVE: (objective measures completed at initial evaluation unless otherwise dated)   DIAGNOSTIC FINDINGS:  11/28/2021: DG Shoulder Right Port: IMPRESSION: Interval total right shoulder arthroplasty without evidence of hardware failure.   09/26/2021: MR Shoulder Right With Contrast: IMPRESSION: 1. Age advanced glenohumeral degenerative changes with diffuse chondral thinning and prominent osteophytes of the humeral head inferiorly. 2. No specific signs of recent glenohumeral dislocation. There is no evidence of Hill-Sachs deformity or anterior labroligamentous injury. The labrum is diffusely degenerated, especially posteriorly. 3. Intact rotator cuff with mild infraspinatus tendinosis.   PATIENT SURVEYS:  [name] 27/55, 49% disability  Quick Dash (01/30/2022): 21/55, 38% disability  Quick Dash (02/20/2022): 17/55, 31% disability   COGNITION:           Overall cognitive status: Within functional limits for tasks assessed                                  SENSATION: Not tested   POSTURE: Forward head and rounded shoulders   UPPER EXTREMITY ROM:    A/PROM Right eval Left eval Right 01/25/2022 Right 02/06/2022 Right 02/08/2022 Right 02/20/2022  Shoulder flexion 110p! 180 130p! 137p!/ 140p! 155 152  Shoulder abduction 102p! 180 119p! 137p!/ 160 150 155  Shoulder internal rotation  45 70/80 45 57/70 58 65  Shoulder external rotation 40 77/90 40 50/55 52 46  (Blank rows = not tested)   UPPER EXTREMITY MMT:   MMT Right eval Left eval Right 02/06/2022  Shoulder flexion 4+/5 5/5 5/5  Shoulder abduction 4+/5 5/5 5/5  Shoulder internal rotation 4/5 5/5 5/5  Shoulder external rotation 4/5 5/5 5/5  Middle trapezius 3/5 4/5   Lower trapezius Not tested 3/5   Latissimus dorsi 3/5 4/5   Elbow flexion 5/5 5/5   Elbow extension 5/5 5/5   Grip strength (lbs)       (Blank rows = not tested)     JOINT MOBILITY TESTING:  WNL GHJ mobility in all planes BIL   PALPATION:  No TTP about Rt shoulder surgical incisions             TODAY'S TREATMENT:   [facility] Adult PT Treatment:  DATE: 02/22/2022 Therapeutic Exercise: UBE x17mn forward/ x277m backward while collecting subjective information Supine BIL alternating shoulder flexion and extension with 2# dumbbells 3x10 BIL Supine chest flies with 2# dumbbells 3x10 Prone BIL shoulder extension with 2# dumbbells 3x10 Seated low rows with 40# cable 2x10 Seated high rows with 40# cable 2x10 Seated lat pull-downs with 30# cable 2x10 Seated shoulder rolls 2x10 forward and backward Seated forward ball toss and catch with BIL hands with 1kg ball 3x20 with cues for load dispersal Standing Rt shoulder IR with pull into ER stretch with 3# cable at 90/90 position 3x10 Standing Rt shoulder repetitive quick wall tosses with 0.5kg ball with Rt shoulder at 90/90 3x20 Seated BIL shoulder flexion AAROM stretch with bolster at table [id number] Manual Therapy: N/A Neuromuscular re-ed: N/A Therapeutic Activity: N/A Modalities: N/A Self Care: N/A    [facility]                                                [facility]: 02[facility]20[facility]20[facility] Therapeutic Exercise: Standing Rt shoulder IR stretch with towel with instruction to not go past a gentle stretch 3x10 with 5-sec hold Seated Rt shoulder ER  stretch with bolster at table 3x30sec Bent-over Rt shoulder pendulums with 3# dumbbell 3x30sec Standing BIL shoulder flexion AAROM with black physioball at wall 3x10 with 5-sec holds Standing BIL shoulder extension with 7# cables 3x10 Manual Therapy: N/A Neuromuscular re-ed: N/A Therapeutic Activity: Re-administration of QuickDASH with pt education Re-assessment of objective measures with pt education Standing scaption stepwise lift from counter top to chest-level shelf, then to overhead shelf, and back again 2x10 on Rt with 3# dumbbell Modalities: N/A Self Care: N/A   [facility]                                                DATE: 02/08/2022 Therapeutic Exercise: Standing BIL shoulder flexion AAROM with black physioball at wall with PT perturbations at end range 3x30 seconds Standing BIL shoulder V lifts with black physioball 3x10 BIL Standing BIL shoulder ER with 3# cables 3x10 Standing BIL shoulder extension with 3# cables 3x10 Supine BIL shoulder flexion with 3# dumbbells 3x10 Seated low rows with 40# cable 2x10 Seated high rows with 40# cable 2x10 Seated serratus punch with 35# cable 3x10 Standing Rt shoulder AAROM with dowel and towel under arm 3x10 with 5-sec hold Standing Rt shoulder IR stretch with towel with instruction to not go past a gentle stretch 3x10 with 5-sec hold Manual Therapy: N/A Neuromuscular re-ed: N/A Therapeutic Activity: N/A Modalities: N/A Self Care: N/A    PATIENT EDUCATION: Education details: Pt educated on underlying pathophysiology, prognosis, protocol, POC, QuickDASH, and HEP Person educated: Patient and Parent Education method: Explanation, Demonstration, and Handouts Education comprehension: verbalized understanding and returned demonstration     HOME EXERCISE PROGRAM: Access Code: [email or web address]: [email or web address] Date: 01/23/2022 Prepared by: SteEvelene Croonxercises - Standing Shoulder  Scaption 45-90 degrees  - 1 x daily - 7 x weekly - 3 sets - 10 reps - Seated Single Arm Elbow Flexion with Resistance  - 1 x daily - 7 x weekly - 2 sets - 10 reps - Shoulder External Rotation with Resistance  - 1  x daily - 7 x weekly - 3 sets - 10 reps - Standing Isometric Shoulder Internal Rotation at Doorway  - 1 x daily - 7 x weekly - 3 sets - 10 reps - 5 seconds hold  Patient Education - Scar Massage   ASSESSMENT:   CLINICAL IMPRESSION: Pt presents 12 weeks and 2 days s/p Rt TSA 11/28/2021. Pt continues to progress well to phase III of his rehab protocol. He demonstrates improved eccentric control of loaded shoulder exercises and will continue to benefit from skilled PT to address her primary impairments and return to her prior level of function with less limitation.   OBJECTIVE IMPAIRMENTS decreased activity tolerance, decreased mobility, decreased ROM, decreased strength, hypomobility, increased edema, impaired flexibility, impaired UE functional use, improper body mechanics, postural dysfunction, and pain.    ACTIVITY LIMITATIONS carrying, lifting, sleeping, bathing, dressing, reach over head, and hygiene/grooming   PARTICIPATION LIMITATIONS: meal prep, cleaning, laundry, driving, shopping, community activity, occupation, and yard work   PERSONAL FACTORS 3+ comorbidities: See medical hx  are also affecting patient's functional outcome.        GOALS: Goals reviewed with patient? Yes   SHORT TERM GOALS: Target date: 02/06/2022     Pt will report understanding and adherence to initial HEP in order to promote independence in the management of primary impairments. Baseline: HEP provided at eval 01/25/2022: Pt reports adherence to his HEP Goal status: ACHIEVED     LONG TERM GOALS: Target date: 03/06/2022    Pt will achieve a QuickDASH score of 30% or less in order to demonstrate improved functional ability as it relates to his shoulder impairments. Baseline: 49% 01/30/2022:  38% 02/20/2022: 31% Goal status: NEARLY MET   2.  Pt will demonstrate Rt shoulder elevation AROM of 150 degrees in order to get dressed with less limitation. Baseline: Flexion: 110, abduction: 102 01/25/2022: Flexion: 130, abduction: 119 02/06/2022: Flexion and abduction to 137 degrees 02/20/2022: Flexion 152, abduction 155 Goal status: ACHIEVED   3.  Pt will demonstrate ability to lift 10# overhead with Rt UE in order to return to working with less limitation. Baseline: Unable to lift any amount of weight overhead 02/06/2022: Pt demonstrate ability to lift 2# dumbbell overhead with Rt UE Goal status: IN PROGRESS   4.  Pt will achieve global Rt shoulder strength of 5/5 in order to return to working out independently with less limitation. Baseline: See MMT chart 02/06/2022: 5/5 globally Goal status: ACHIEVED   5.  Pt will report worst pain of 0-3/10 in order to complete household ADLs with less limitation. Baseline: worst pain 8/10 01/25/2022: worst pain 6/10  02/20/2022: Worst pain 2/10 Goal status: ACHIEVED       PLAN: PT FREQUENCY: 1-2x/week   PT DURATION: 8 weeks   PLANNED INTERVENTIONS: Therapeutic exercises, Therapeutic activity, Neuromuscular re-education, Patient/Family education, Self Care, Joint mobilization, Aquatic Therapy, Dry Needling, Electrical stimulation, Spinal manipulation, Spinal mobilization, Cryotherapy, Moist heat, scar mobilization, Taping, Vasopneumatic device, Biofeedback, Ionotophoresis 14m/ml Dexamethasone, Manual therapy, and Re-evaluation   PLAN FOR NEXT SESSION: Progress early shoulder ROM and strengthening in accordance with [facility] post-op protocol   YVanessa Lizton PT, DPT 02/22/22 2:42 PM

## 2022-02-27 ENCOUNTER — Other Ambulatory Visit: Payer: Self-pay | Admitting: Physician Assistant

## 2022-02-27 ENCOUNTER — Ambulatory Visit: Payer: Medicaid Other

## 2022-02-27 DIAGNOSIS — M25611 Stiffness of right shoulder, not elsewhere classified: Secondary | ICD-10-CM

## 2022-02-27 DIAGNOSIS — M25511 Pain in right shoulder: Secondary | ICD-10-CM | POA: Diagnosis not present

## 2022-02-27 DIAGNOSIS — R600 Localized edema: Secondary | ICD-10-CM

## 2022-02-27 DIAGNOSIS — M6281 Muscle weakness (generalized): Secondary | ICD-10-CM

## 2022-02-27 DIAGNOSIS — G8929 Other chronic pain: Secondary | ICD-10-CM

## 2022-02-27 NOTE — Therapy (Signed)
OUTPATIENT PHYSICAL THERAPY TREATMENT NOTE   Patient Name: Matthew [name] MRN: [id number] DOB:1976-03-19, 46 y.o., male Today's Date: 02/27/2022  PCP: [name], MD REFERRING PROVIDER: Marchia Bond, MD  END OF SESSION:   PT End of Session - 02/27/22 1402     Visit Number 12    Number of Visits 17    Date for PT Re-Evaluation 03/14/22    Authorization Type [place] [employer]    Authorization Time Period 01/30/2022-03/12/2022    Authorization - Visit Number 7    Authorization - Number of Visits 12    PT Start Time 0370    PT Stop Time 4888    PT Time Calculation (min) 40 min    Activity Tolerance Patient tolerated treatment well    Behavior During Therapy WFL for tasks assessed/performed                     Past Medical History:  Diagnosis Date   Anxiety    Arthritis    Celiac disease    Coronary artery disease    Dairy allergy    Depression    Diabetes mellitus without complication ([place])    High cholesterol    Hypertension    Hypothyroidism    Myocardial infarction ([place])    Paranoia ([place])    Schizoaffective disorder ([place])    Past Surgical History:  Procedure Laterality Date   APPENDECTOMY     CARDIAC CATHETERIZATION     CORONARY STENT INTERVENTION N/A 06/06/2020   Procedure: CORONARY STENT INTERVENTION;  Surgeon: Martinique, Peter M, MD;  Location: [facility];  Service: Cardiovascular;  Laterality: N/A;  prox LAD, distal RCA   INTRAVASCULAR ULTRASOUND/IVUS N/A 06/06/2020   Procedure: Intravascular Ultrasound/IVUS;  Surgeon: Martinique, Peter M, MD;  Location: [facility];  Service: Cardiovascular;  Laterality: N/A;   LEFT HEART CATH AND CORONARY ANGIOGRAPHY N/A 06/06/2020   Procedure: LEFT HEART CATH AND CORONARY ANGIOGRAPHY;  Surgeon: Martinique, Peter M, MD;  Location: [facility];  Service: Cardiovascular;  Laterality: N/A;   LEFT HEART CATH AND CORONARY ANGIOGRAPHY N/A 02/24/2021   Procedure: LEFT HEART CATH AND CORONARY ANGIOGRAPHY;  Surgeon:  Belva Crome, MD;  Location: [facility];  Service: Cardiovascular;  Laterality: N/A;   NASAL SEPTUM SURGERY     TOTAL SHOULDER ARTHROPLASTY Right 11/28/2021   Procedure: TOTAL SHOULDER ARTHROPLASTY;  Surgeon: Marchia Bond, MD;  Location: [facility];  Service: Orthopedics;  Laterality: Right;   WRIST FRACTURE SURGERY     right wrist   Patient Active Problem List   Diagnosis Date Noted   S/P shoulder replacement, right 11/28/2021   Atypical chest pain 04/23/2021   Syncope 04/23/2021   Chronic hyponatremia 04/23/2021   Leukocytosis 04/23/2021   Right rib fracture 04/23/2021   High cholesterol    Tobacco abuse    Unstable angina ([place]) 02/24/2021   Right-sided chest pain 09/11/2020   Coronary artery disease of bypass graft of native heart with stable angina pectoris ([place])    Uncontrolled type 2 diabetes mellitus with hyperglycemia ([place])    Hyponatremia    Essential hypertension    History of hyperprolactinemia 08/16/2020   Mixed dyslipidemia 08/15/2020   Chest pain 06/06/2020   Type 2 diabetes mellitus with hyperglycemia ([place]) 06/06/2020   Hyperkalemia 06/06/2020   Thrombocytopenia ([place]) 06/06/2020   Obesity (BMI 30.0-34.9) 06/06/2020   Non-ST elevation (NSTEMI) myocardial infarction [facility])    Attention deficit hyperactivity disorder (ADHD), combined type, moderate 03/10/2018  Generalized anxiety disorder 03/10/2018   Schizoaffective disorder, bipolar type without good prognostic features ([place]) 02/09/2015   Morbid obesity ([place]) 01/18/2015   OSA (obstructive sleep apnea) 07/14/2014   Solitary pulmonary nodule 07/14/2014    REFERRING DIAG: s/p right shoulder replacement 11/28/2021  THERAPY DIAG:  Chronic right shoulder pain  Stiffness of right shoulder, not elsewhere classified  Localized edema  Muscle weakness (generalized)  Rationale for Evaluation and Treatment Rehabilitation  PERTINENT HISTORY: Rt TSA on 11/28/2021, HTN, hx of MI 05/2020, DMII, anxiety,  depression, paranoia, Schizoaffective disorder, coronary stint interventions in 2022  PRECAUTIONS: Shoulder, s/p Rt TSA 11/28/2021, utilizing [facility] orthopedic specialists TSA protocol in lieu of post-op protocol   SUBJECTIVE: Pt reports no pain today. He reports continues improvement with PT.  PAIN:  Are you having pain? Yes: NPRS scale: 0/10 Pain location: Rt shoulder Pain description: Achy Aggravating factors: Rt shoulder flexion and abduction, laying on Rt side Relieving factors: pain medication, ice, and rest   OBJECTIVE: (objective measures completed at initial evaluation unless otherwise dated)   DIAGNOSTIC FINDINGS:  11/28/2021: DG Shoulder Right Port: IMPRESSION: Interval total right shoulder arthroplasty without evidence of hardware failure.   09/26/2021: MR Shoulder Right With Contrast: IMPRESSION: 1. Age advanced glenohumeral degenerative changes with diffuse chondral thinning and prominent osteophytes of the humeral head inferiorly. 2. No specific signs of recent glenohumeral dislocation. There is no evidence of Hill-Sachs deformity or anterior labroligamentous injury. The labrum is diffusely degenerated, especially posteriorly. 3. Intact rotator cuff with mild infraspinatus tendinosis.   PATIENT SURVEYS:  [name] 27/55, 49% disability  Quick Dash (01/30/2022): 21/55, 38% disability  Quick Dash (02/20/2022): 17/55, 31% disability   COGNITION:           Overall cognitive status: Within functional limits for tasks assessed                                  SENSATION: Not tested   POSTURE: Forward head and rounded shoulders   UPPER EXTREMITY ROM:    A/PROM Right eval Left eval Right 01/25/2022 Right 02/06/2022 Right 02/08/2022 Right 02/20/2022  Shoulder flexion 110p! 180 130p! 137p!/ 140p! 155 152  Shoulder abduction 102p! 180 119p! 137p!/ 160 150 155  Shoulder internal rotation 45 70/80 45 57/70 58 65  Shoulder external rotation 40 77/90 40 50/55 52  46  (Blank rows = not tested)   UPPER EXTREMITY MMT:   MMT Right eval Left eval Right 02/06/2022  Shoulder flexion 4+/5 5/5 5/5  Shoulder abduction 4+/5 5/5 5/5  Shoulder internal rotation 4/5 5/5 5/5  Shoulder external rotation 4/5 5/5 5/5  Middle trapezius 3/5 4/5   Lower trapezius Not tested 3/5   Latissimus dorsi 3/5 4/5   Elbow flexion 5/5 5/5   Elbow extension 5/5 5/5   Grip strength (lbs)       (Blank rows = not tested)     JOINT MOBILITY TESTING:  WNL GHJ mobility in all planes BIL   PALPATION:  No TTP about Rt shoulder surgical incisions             TODAY'S TREATMENT:   [facility] Adult PT Treatment:                                                DATE: 02/27/2022  Therapeutic Exercise: Standing biceps pull-downs with 7# cables 3x10 Bent-over single-arm rear delt flies with 3# cable 3x10 BIL Seated low rows with 45# cable 2x10 Seated high rows with 45# cable 2x10 Seated shoulder rolls 2x10 forward and backward Standing 90/90 shoulder IR with GTB 3x10 Standing 90/90 shoulder ER with GTB 3x10 Seated Rt shoulder ER AAROM with dowel rod 2x10 with 5-sec hold Seated Rt shoulder IR stretch with towel 2x10 with 5-sec hold with cues to avoid painful range Seated cross-body shoulder adduction stretch 2x58mn with cues to avoid painful range Manual Therapy: N/A Neuromuscular re-ed: N/A Therapeutic Activity: Standing scaption stepwise lift from counter top to chest-level shelf, then to overhead shelf, and back again 2x10 on Rt with 3# dumbbell Modalities: N/A Self Care: N/A   [facility]                                                [facility]: [facility] Therapeutic Exercise: UBE x229m forward/ [id number] while collecting subjective information Supine BIL alternating shoulder flexion and extension with 2# dumbbells 3x10 BIL Supine chest flies with 2# dumbbells 3x10 Prone BIL shoulder extension with 2# dumbbells 3x10 Seated low rows with 40# cable  2x10 Seated high rows with 40# cable 2x10 Seated lat pull-downs with 30# cable 2x10 Seated shoulder rolls 2x10 forward and backward Seated forward ball toss and catch with BIL hands with 1kg ball 3x20 with cues for load dispersal Standing Rt shoulder IR with pull into ER stretch with 3# cable at 90/90 position 3x10 Standing Rt shoulder repetitive quick wall tosses with 0.5kg ball with Rt shoulder at 90/90 3x20 Seated BIL shoulder flexion AAROM stretch with bolster at table [id number] Therapy: N/A Neuromuscular re-ed: N/A Therapeutic Activity: N/A Modalities: N/A Self Care: N/A    [facility]                                                [facility]: 02/20/2022 Therapeutic Exercise: Standing Rt shoulder IR stretch with towel with instruction to not go past a gentle stretch 3x10 with 5-sec hold Seated Rt shoulder ER stretch with bolster at table 3x30sec Bent-over Rt shoulder pendulums with 3# dumbbell 3x30sec Standing BIL shoulder flexion AAROM with black physioball at wall 3x10 with 5-sec holds Standing BIL shoulder extension with 7# cables 3x10 Manual Therapy: N/A Neuromuscular re-ed: N/A Therapeutic Activity: Re-administration of QuickDASH with pt education Re-assessment of objective measures with pt education Standing scaption stepwise lift from counter top to chest-level shelf, then to overhead shelf, and back again 2x10 on Rt with 3# dumbbell Modalities: N/A Self Care: N/A     PATIENT EDUCATION: Education details: Pt educated on underlying pathophysiology, prognosis, protocol, POC, QuickDASH, and HEP Person educated: Patient and Parent Education method: Explanation, Demonstration, and Handouts Education comprehension: verbalized understanding and returned demonstration     HOME EXERCISE PROGRAM: Access Code: [email or web address]: [email or web address] Date: 01/23/2022 Prepared by: StepEvelene Croonercises - Standing Shoulder Scaption  45-90 degrees  - 1 x daily - 7 x weekly - 3 sets - 10 reps - Seated Single Arm Elbow Flexion with Resistance  - 1 x daily - 7 x weekly - 2 sets - 10 reps - Shoulder  External Rotation with Resistance  - 1 x daily - 7 x weekly - 3 sets - 10 reps - Standing Isometric Shoulder Internal Rotation at Doorway  - 1 x daily - 7 x weekly - 3 sets - 10 reps - 5 seconds hold  Patient Education - Scar Massage   ASSESSMENT:   CLINICAL IMPRESSION: Pt presents 13 weeks s/p Rt TSA 11/28/2021. Pt continues to progress well with loaded shoulder/ biceps exercises and will continue to benefit from skilled PT with regard to protocol and tissue healing to address his primary impairments and return to his prior level of function with less limitation.   OBJECTIVE IMPAIRMENTS decreased activity tolerance, decreased mobility, decreased ROM, decreased strength, hypomobility, increased edema, impaired flexibility, impaired UE functional use, improper body mechanics, postural dysfunction, and pain.    ACTIVITY LIMITATIONS carrying, lifting, sleeping, bathing, dressing, reach over head, and hygiene/grooming   PARTICIPATION LIMITATIONS: meal prep, cleaning, laundry, driving, shopping, community activity, occupation, and yard work   PERSONAL FACTORS 3+ comorbidities: See medical hx  are also affecting patient's functional outcome.        GOALS: Goals reviewed with patient? Yes   SHORT TERM GOALS: Target date: 02/06/2022     Pt will report understanding and adherence to initial HEP in order to promote independence in the management of primary impairments. Baseline: HEP provided at eval 01/25/2022: Pt reports adherence to his HEP Goal status: ACHIEVED     LONG TERM GOALS: Target date: 03/06/2022    Pt will achieve a QuickDASH score of 30% or less in order to demonstrate improved functional ability as it relates to his shoulder impairments. Baseline: 49% 01/30/2022: 38% 02/20/2022: 31% Goal status: NEARLY MET   2.   Pt will demonstrate Rt shoulder elevation AROM of 150 degrees in order to get dressed with less limitation. Baseline: Flexion: 110, abduction: 102 01/25/2022: Flexion: 130, abduction: 119 02/06/2022: Flexion and abduction to 137 degrees 02/20/2022: Flexion 152, abduction 155 Goal status: ACHIEVED   3.  Pt will demonstrate ability to lift 10# overhead with Rt UE in order to return to working with less limitation. Baseline: Unable to lift any amount of weight overhead 02/06/2022: Pt demonstrate ability to lift 2# dumbbell overhead with Rt UE Goal status: IN PROGRESS   4.  Pt will achieve global Rt shoulder strength of 5/5 in order to return to working out independently with less limitation. Baseline: See MMT chart 02/06/2022: 5/5 globally Goal status: ACHIEVED   5.  Pt will report worst pain of 0-3/10 in order to complete household ADLs with less limitation. Baseline: worst pain 8/10 01/25/2022: worst pain 6/10  02/20/2022: Worst pain 2/10 Goal status: ACHIEVED       PLAN: PT FREQUENCY: 1-2x/week   PT DURATION: 8 weeks   PLANNED INTERVENTIONS: Therapeutic exercises, Therapeutic activity, Neuromuscular re-education, Patient/Family education, Self Care, Joint mobilization, Aquatic Therapy, Dry Needling, Electrical stimulation, Spinal manipulation, Spinal mobilization, Cryotherapy, Moist heat, scar mobilization, Taping, Vasopneumatic device, Biofeedback, Ionotophoresis 71m/ml Dexamethasone, Manual therapy, and Re-evaluation   PLAN FOR NEXT SESSION: Progress early shoulder ROM and strengthening in accordance with [facility] post-op protocol   YVanessa Hilton PT, DPT 02/27/22 2:43 PM

## 2022-03-01 ENCOUNTER — Ambulatory Visit: Payer: Medicaid Other

## 2022-03-01 DIAGNOSIS — G8929 Other chronic pain: Secondary | ICD-10-CM

## 2022-03-01 DIAGNOSIS — R600 Localized edema: Secondary | ICD-10-CM

## 2022-03-01 DIAGNOSIS — M25611 Stiffness of right shoulder, not elsewhere classified: Secondary | ICD-10-CM

## 2022-03-01 DIAGNOSIS — M25511 Pain in right shoulder: Secondary | ICD-10-CM | POA: Diagnosis not present

## 2022-03-01 DIAGNOSIS — M6281 Muscle weakness (generalized): Secondary | ICD-10-CM

## 2022-03-01 NOTE — Therapy (Signed)
OUTPATIENT PHYSICAL THERAPY TREATMENT NOTE   Patient Name: Matthew [name] MRN: [id number] DOB:1975/11/03, 46 y.o., male Today's Date: 03/01/2022  PCP: [name], MD REFERRING PROVIDER: Marchia Bond, MD  END OF SESSION:   PT End of Session - 03/01/22 1358     Visit Number 13    Number of Visits 17    Date for PT Re-Evaluation 03/14/22    Authorization Type [employer]    Authorization Time Period 01/30/2022-03/12/2022    Authorization - Visit Number 8    Authorization - Number of Visits 12    PT Start Time 1400    PT Stop Time 1440    PT Time Calculation (min) 40 min    Activity Tolerance Patient tolerated treatment well    Behavior During Therapy WFL for tasks assessed/performed                      Past Medical History:  Diagnosis Date   Anxiety    Arthritis    Celiac disease    Coronary artery disease    Dairy allergy    Depression    Diabetes mellitus without complication ([place])    High cholesterol    Hypertension    Hypothyroidism    Myocardial infarction ([place])    Paranoia ([place])    Schizoaffective disorder ([place])    Past Surgical History:  Procedure Laterality Date   APPENDECTOMY     CARDIAC CATHETERIZATION     CORONARY STENT INTERVENTION N/A 06/06/2020   Procedure: CORONARY STENT INTERVENTION;  Surgeon: Martinique, Peter M, MD;  Location: [facility];  Service: Cardiovascular;  Laterality: N/A;  prox LAD, distal RCA   INTRAVASCULAR ULTRASOUND/IVUS N/A 06/06/2020   Procedure: Intravascular Ultrasound/IVUS;  Surgeon: Martinique, Peter M, MD;  Location: [place] [facility];  Service: Cardiovascular;  Laterality: N/A;   LEFT HEART CATH AND CORONARY ANGIOGRAPHY N/A 06/06/2020   Procedure: LEFT HEART CATH AND CORONARY ANGIOGRAPHY;  Surgeon: Martinique, Peter M, MD;  Location: [facility];  Service: Cardiovascular;  Laterality: N/A;   LEFT HEART CATH AND CORONARY ANGIOGRAPHY N/A 02/24/2021   Procedure: LEFT HEART CATH AND CORONARY ANGIOGRAPHY;   Surgeon: Belva Crome, MD;  Location: [facility];  Service: Cardiovascular;  Laterality: N/A;   NASAL SEPTUM SURGERY     TOTAL SHOULDER ARTHROPLASTY Right 11/28/2021   Procedure: TOTAL SHOULDER ARTHROPLASTY;  Surgeon: Marchia Bond, MD;  Location: [facility];  Service: Orthopedics;  Laterality: Right;   WRIST FRACTURE SURGERY     right wrist   Patient Active Problem List   Diagnosis Date Noted   S/P shoulder replacement, right 11/28/2021   Atypical chest pain 04/23/2021   Syncope 04/23/2021   Chronic hyponatremia 04/23/2021   Leukocytosis 04/23/2021   Right rib fracture 04/23/2021   High cholesterol    Tobacco abuse    Unstable angina ([place]) 02/24/2021   Right-sided chest pain 09/11/2020   Coronary artery disease of bypass graft of native heart with stable angina pectoris ([place])    Uncontrolled type 2 diabetes mellitus with hyperglycemia ([place])    Hyponatremia    Essential hypertension    History of hyperprolactinemia 08/16/2020   Mixed dyslipidemia 08/15/2020   Chest pain 06/06/2020   Type 2 diabetes mellitus with hyperglycemia ([place]) 06/06/2020   Hyperkalemia 06/06/2020   Thrombocytopenia ([place]) 06/06/2020   Obesity (BMI 30.0-34.9) 06/06/2020   Non-ST elevation (NSTEMI) myocardial infarction [facility])    Attention deficit hyperactivity disorder (ADHD), combined type, moderate  03/10/2018   Generalized anxiety disorder 03/10/2018   Schizoaffective disorder, bipolar type without good prognostic features ([place]) 02/09/2015   Morbid obesity ([place]) 01/18/2015   OSA (obstructive sleep apnea) 07/14/2014   Solitary pulmonary nodule 07/14/2014    REFERRING DIAG: s/p right shoulder replacement 11/28/2021  THERAPY DIAG:  Chronic right shoulder pain  Stiffness of right shoulder, not elsewhere classified  Localized edema  Muscle weakness (generalized)  Rationale for Evaluation and Treatment Rehabilitation  PERTINENT HISTORY: Rt TSA on 11/28/2021, HTN, hx of MI 05/2020, DMII, anxiety,  depression, paranoia, Schizoaffective disorder, coronary stint interventions in 2022  PRECAUTIONS: Shoulder, s/p Rt TSA 11/28/2021, utilizing [facility] orthopedic specialists TSA protocol in lieu of post-op protocol   SUBJECTIVE: Pt denies any pain today, adding that he did well after his last treatment session. He reports he feels he is nearing independence in the management of his impairments moving forward.   PAIN:  Are you having pain? Yes: NPRS scale: 0/10 Pain location: Rt shoulder Pain description: Achy Aggravating factors: Rt shoulder flexion and abduction, laying on Rt side Relieving factors: pain medication, ice, and rest   OBJECTIVE: (objective measures completed at initial evaluation unless otherwise dated)   DIAGNOSTIC FINDINGS:  11/28/2021: DG Shoulder Right Port: IMPRESSION: Interval total right shoulder arthroplasty without evidence of hardware failure.   09/26/2021: MR Shoulder Right With Contrast: IMPRESSION: 1. Age advanced glenohumeral degenerative changes with diffuse chondral thinning and prominent osteophytes of the humeral head inferiorly. 2. No specific signs of recent glenohumeral dislocation. There is no evidence of Hill-Sachs deformity or anterior labroligamentous injury. The labrum is diffusely degenerated, especially posteriorly. 3. Intact rotator cuff with mild infraspinatus tendinosis.   PATIENT SURVEYS:  [name] 27/55, 49% disability  Quick Dash (01/30/2022): 21/55, 38% disability  Quick Dash (02/20/2022): 17/55, 31% disability   COGNITION:           Overall cognitive status: Within functional limits for tasks assessed                                  SENSATION: Not tested   POSTURE: Forward head and rounded shoulders   UPPER EXTREMITY ROM:    A/PROM Right eval Left eval Right 01/25/2022 Right 02/06/2022 Right 02/08/2022 Right 02/20/2022  Shoulder flexion 110p! 180 130p! 137p!/ 140p! 155 152  Shoulder abduction 102p! 180 119p!  137p!/ 160 150 155  Shoulder internal rotation 45 70/80 45 57/70 58 65  Shoulder external rotation 40 77/90 40 50/55 52 46  (Blank rows = not tested)   UPPER EXTREMITY MMT:   MMT Right eval Left eval Right 02/06/2022  Shoulder flexion 4+/5 5/5 5/5  Shoulder abduction 4+/5 5/5 5/5  Shoulder internal rotation 4/5 5/5 5/5  Shoulder external rotation 4/5 5/5 5/5  Middle trapezius 3/5 4/5   Lower trapezius Not tested 3/5   Latissimus dorsi 3/5 4/5   Elbow flexion 5/5 5/5   Elbow extension 5/5 5/5   Grip strength (lbs)       (Blank rows = not tested)     JOINT MOBILITY TESTING:  WNL GHJ mobility in all planes BIL   PALPATION:  No TTP about Rt shoulder surgical incisions             TODAY'S TREATMENT:   [facility] Adult PT Treatment:  DATE: 03/01/2022 Therapeutic Exercise: Standing BIL chest fly with 7# cables with slow return, not extending past torso 3x10 Standing scapular "W" with 3# cables 3x10 Standing shoulder press at 120 degrees shoulder flexion with 3# cables 3x10 Standing biceps pull-downs x10 with 3# cables, 2x10 with 7# cables Standing black physioball roll up wall with alternating low trap lift-offs at end range flexion 3x20 Seated low rows with 45# cable 2x10 Seated high rows with 45# cable 2x10 Seated shoulder rolls 2x10 forward and backward Standing BIL shoulder extension with scapular retraction with 7# cables 3x10 Standing Rt shoulder cross-body adduction stretch x61mn Manual Therapy: N/A Neuromuscular re-ed: N/A Therapeutic Activity: Standing Rt scaption stepwise lift from counter top to chest-level shelf, then to overhead shelf, and back again x10 with 4# dumbbell, x10 with 3# dumbbell Modalities: N/A Self Care: N/A   [facility]                                                [facility]: 02/27/20[facility] Therapeutic Exercise: Standing biceps pull-downs with 7# cables 3x10 Bent-over single-arm rear delt  flies with 3# cable 3x10 BIL Seated low rows with 45# cable 2x10 Seated high rows with 45# cable 2x10 Seated shoulder rolls 2x10 forward and backward Standing 90/90 shoulder IR with GTB 3x10 Standing 90/90 shoulder ER with GTB 3x10 Seated Rt shoulder ER AAROM with dowel rod 2x10 with 5-sec hold Seated Rt shoulder IR stretch with towel 2x10 with 5-sec hold with cues to avoid painful range Seated cross-body shoulder adduction stretch 2x155m with cues to avoid painful range Manual Therapy: N/A Neuromuscular re-ed: N/A Therapeutic Activity: Standing scaption stepwise lift from counter top to chest-level shelf, then to overhead shelf, and back again 2x10 on Rt with 3# dumbbell Modalities: N/A Self Care: N/A   [facility]                                                DATE: 02/22/2022 Therapeutic Exercise: UBE x2m1mforward/ [id number] while collecting subjective information Supine BIL alternating shoulder flexion and extension with 2# dumbbells 3x10 BIL Supine chest flies with 2# dumbbells 3x10 Prone BIL shoulder extension with 2# dumbbells 3x10 Seated low rows with 40# cable 2x10 Seated high rows with 40# cable 2x10 Seated lat pull-downs with 30# cable 2x10 Seated shoulder rolls 2x10 forward and backward Seated forward ball toss and catch with BIL hands with 1kg ball 3x20 with cues for load dispersal Standing Rt shoulder IR with pull into ER stretch with 3# cable at 90/90 position 3x10 Standing Rt shoulder repetitive quick wall tosses with 0.5kg ball with Rt shoulder at 90/90 3x20 Seated BIL shoulder flexion AAROM stretch with bolster at table [id number] Therapy: N/A Neuromuscular re-ed: N/A Therapeutic Activity: N/A Modalities: N/A Self Care: N/A      PATIENT EDUCATION: Education details: Pt educated on underlying pathophysiology, prognosis, protocol, POC, QuickDASH, and HEP Person educated: Patient and Parent Education method: Explanation,  Demonstration, and Handouts Education comprehension: verbalized understanding and returned demonstration     HOME EXERCISE PROGRAM: Access Code: [email or web address] Date: 01/23/2022 Prepared by: StephEvelene Croonrcises - Standing Shoulder Scaption 45-90 degrees  - 1 x daily - 7 x  weekly - 3 sets - 10 reps - Seated Single Arm Elbow Flexion with Resistance  - 1 x daily - 7 x weekly - 2 sets - 10 reps - Shoulder External Rotation with Resistance  - 1 x daily - 7 x weekly - 3 sets - 10 reps - Standing Isometric Shoulder Internal Rotation at Doorway  - 1 x daily - 7 x weekly - 3 sets - 10 reps - 5 seconds hold  Patient Education - Scar Massage   ASSESSMENT:   CLINICAL IMPRESSION: Pt presents 13 weeks and 2 days s/p Rt TSA 11/28/2021. He continues to tolerate loaded shoulder exercises well. Pt will continue to benefit from skilled PT to address his primary impairments and return to his prior level of function with less limitation.   OBJECTIVE IMPAIRMENTS decreased activity tolerance, decreased mobility, decreased ROM, decreased strength, hypomobility, increased edema, impaired flexibility, impaired UE functional use, improper body mechanics, postural dysfunction, and pain.    ACTIVITY LIMITATIONS carrying, lifting, sleeping, bathing, dressing, reach over head, and hygiene/grooming   PARTICIPATION LIMITATIONS: meal prep, cleaning, laundry, driving, shopping, community activity, occupation, and yard work   PERSONAL FACTORS 3+ comorbidities: See medical hx  are also affecting patient's functional outcome.        GOALS: Goals reviewed with patient? Yes   SHORT TERM GOALS: Target date: 02/06/2022     Pt will report understanding and adherence to initial HEP in order to promote independence in the management of primary impairments. Baseline: HEP provided at eval 01/25/2022: Pt reports adherence to his HEP Goal status: ACHIEVED     LONG TERM GOALS: Target  date: 03/06/2022    Pt will achieve a QuickDASH score of 30% or less in order to demonstrate improved functional ability as it relates to his shoulder impairments. Baseline: 49% 01/30/2022: 38% 02/20/2022: 31% Goal status: NEARLY MET   2.  Pt will demonstrate Rt shoulder elevation AROM of 150 degrees in order to get dressed with less limitation. Baseline: Flexion: 110, abduction: 102 01/25/2022: Flexion: 130, abduction: 119 02/06/2022: Flexion and abduction to 137 degrees 02/20/2022: Flexion 152, abduction 155 Goal status: ACHIEVED   3.  Pt will demonstrate ability to lift 10# overhead with Rt UE in order to return to working with less limitation. Baseline: Unable to lift any amount of weight overhead 02/06/2022: Pt demonstrate ability to lift 2# dumbbell overhead with Rt UE Goal status: IN PROGRESS   4.  Pt will achieve global Rt shoulder strength of 5/5 in order to return to working out independently with less limitation. Baseline: See MMT chart 02/06/2022: 5/5 globally Goal status: ACHIEVED   5.  Pt will report worst pain of 0-3/10 in order to complete household ADLs with less limitation. Baseline: worst pain 8/10 01/25/2022: worst pain 6/10  02/20/2022: Worst pain 2/10 Goal status: ACHIEVED       PLAN: PT FREQUENCY: 1-2x/week   PT DURATION: 8 weeks   PLANNED INTERVENTIONS: Therapeutic exercises, Therapeutic activity, Neuromuscular re-education, Patient/Family education, Self Care, Joint mobilization, Aquatic Therapy, Dry Needling, Electrical stimulation, Spinal manipulation, Spinal mobilization, Cryotherapy, Moist heat, scar mobilization, Taping, Vasopneumatic device, Biofeedback, Ionotophoresis 53m/ml Dexamethasone, Manual therapy, and Re-evaluation   PLAN FOR NEXT SESSION: Progress early shoulder ROM and strengthening in accordance with [facility] post-op protocol   YVanessa [place] PT, DPT 03/01/22 2:43 PM

## 2022-03-06 ENCOUNTER — Ambulatory Visit: Payer: Medicaid Other

## 2022-03-06 NOTE — Therapy (Signed)
OUTPATIENT PHYSICAL THERAPY TREATMENT NOTE   Patient Name: Matthew [name] MRN: [id number] DOB:03-30-1976, 46 y.o., male Today's Date: 03/06/2022  PCP: [name], MD REFERRING PROVIDER: Marchia Bond, MD  END OF SESSION:   PT End of Session - 03/06/22 1357     Visit Number 13    Number of Visits 17    Date for PT Re-Evaluation 03/14/22    Authorization Type [employer]    Authorization Time Period 01/30/2022-03/12/2022    Authorization - Visit Number 8    Authorization - Number of Visits 12    PT Start Time 1400    PT Stop Time 1400    PT Time Calculation (min) 0 min    Activity Tolerance --    Behavior During Therapy --                       Past Medical History:  Diagnosis Date   Anxiety    Arthritis    Celiac disease    Coronary artery disease    Dairy allergy    Depression    Diabetes mellitus without complication ([place])    High cholesterol    Hypertension    Hypothyroidism    Myocardial infarction ([place])    Paranoia ([place])    Schizoaffective disorder ([place])    Past Surgical History:  Procedure Laterality Date   APPENDECTOMY     CARDIAC CATHETERIZATION     CORONARY STENT INTERVENTION N/A 06/06/2020   Procedure: CORONARY STENT INTERVENTION;  Surgeon: Martinique, Peter M, MD;  Location: [place];  Service: Cardiovascular;  Laterality: N/A;  prox LAD, distal RCA   INTRAVASCULAR ULTRASOUND/IVUS N/A 06/06/2020   Procedure: Intravascular Ultrasound/IVUS;  Surgeon: Martinique, Peter M, MD;  Location: [place] [facility];  Service: Cardiovascular;  Laterality: N/A;   LEFT HEART CATH AND CORONARY ANGIOGRAPHY N/A 06/06/2020   Procedure: LEFT HEART CATH AND CORONARY ANGIOGRAPHY;  Surgeon: Martinique, Peter M, MD;  Location: [place] [facility];  Service: Cardiovascular;  Laterality: N/A;   LEFT HEART CATH AND CORONARY ANGIOGRAPHY N/A 02/24/2021   Procedure: LEFT HEART CATH AND CORONARY ANGIOGRAPHY;  Surgeon: Belva Crome, MD;  Location: [facility];   Service: Cardiovascular;  Laterality: N/A;   NASAL SEPTUM SURGERY     TOTAL SHOULDER ARTHROPLASTY Right 11/28/2021   Procedure: TOTAL SHOULDER ARTHROPLASTY;  Surgeon: Marchia Bond, MD;  Location: [facility];  Service: Orthopedics;  Laterality: Right;   WRIST FRACTURE SURGERY     right wrist   Patient Active Problem List   Diagnosis Date Noted   S/P shoulder replacement, right 11/28/2021   Atypical chest pain 04/23/2021   Syncope 04/23/2021   Chronic hyponatremia 04/23/2021   Leukocytosis 04/23/2021   Right rib fracture 04/23/2021   High cholesterol    Tobacco abuse    Unstable angina ([place]) 02/24/2021   Right-sided chest pain 09/11/2020   Coronary artery disease of bypass graft of native heart with stable angina pectoris ([place])    Uncontrolled type 2 diabetes mellitus with hyperglycemia ([place])    Hyponatremia    Essential hypertension    History of hyperprolactinemia 08/16/2020   Mixed dyslipidemia 08/15/2020   Chest pain 06/06/2020   Type 2 diabetes mellitus with hyperglycemia ([place]) 06/06/2020   Hyperkalemia 06/06/2020   Thrombocytopenia ([place]) 06/06/2020   Obesity (BMI 30.0-34.9) 06/06/2020   Non-ST elevation (NSTEMI) myocardial infarction [facility])    Attention deficit hyperactivity disorder (ADHD), combined type, moderate 03/10/2018   Generalized anxiety  disorder 03/10/2018   Schizoaffective disorder, bipolar type without good prognostic features ([place]) 02/09/2015   Morbid obesity ([place]) 01/18/2015   OSA (obstructive sleep apnea) 07/14/2014   Solitary pulmonary nodule 07/14/2014    REFERRING DIAG: s/p right shoulder replacement 11/28/2021  THERAPY DIAG:  Chronic right shoulder pain  Stiffness of right shoulder, not elsewhere classified  Localized edema  Muscle weakness (generalized)  Rationale for Evaluation and Treatment Rehabilitation  PERTINENT HISTORY: Rt TSA on 11/28/2021, HTN, hx of MI 05/2020, DMII, anxiety, depression, paranoia, Schizoaffective disorder, coronary  stint interventions in 2022  PRECAUTIONS: Shoulder, s/p Rt TSA 11/28/2021, utilizing [facility] orthopedic specialists TSA protocol in lieu of post-op protocol   SUBJECTIVE: See assessment.  PAIN:  Are you having pain? Yes: NPRS scale: 0/10 Pain location: Rt shoulder Pain description: Achy Aggravating factors: Rt shoulder flexion and abduction, laying on Rt side Relieving factors: pain medication, ice, and rest   OBJECTIVE: (objective measures completed at initial evaluation unless otherwise dated)   DIAGNOSTIC FINDINGS:  11/28/2021: DG Shoulder Right Port: IMPRESSION: Interval total right shoulder arthroplasty without evidence of hardware failure.   09/26/2021: MR Shoulder Right With Contrast: IMPRESSION: 1. Age advanced glenohumeral degenerative changes with diffuse chondral thinning and prominent osteophytes of the humeral head inferiorly. 2. No specific signs of recent glenohumeral dislocation. There is no evidence of Hill-Sachs deformity or anterior labroligamentous injury. The labrum is diffusely degenerated, especially posteriorly. 3. Intact rotator cuff with mild infraspinatus tendinosis.   PATIENT SURVEYS:  [name] 27/55, 49% disability  Quick Dash (01/30/2022): 21/55, 38% disability  Quick Dash (02/20/2022): 17/55, 31% disability   COGNITION:           Overall cognitive status: Within functional limits for tasks assessed                                  SENSATION: Not tested   POSTURE: Forward head and rounded shoulders   UPPER EXTREMITY ROM:    A/PROM Right eval Left eval Right 01/25/2022 Right 02/06/2022 Right 02/08/2022 Right 02/20/2022  Shoulder flexion 110p! 180 130p! 137p!/ 140p! 155 152  Shoulder abduction 102p! 180 119p! 137p!/ 160 150 155  Shoulder internal rotation 45 70/80 45 57/70 58 65  Shoulder external rotation 40 77/90 40 50/55 52 46  (Blank rows = not tested)   UPPER EXTREMITY MMT:   MMT Right eval Left eval Right 02/06/2022   Shoulder flexion 4+/5 5/5 5/5  Shoulder abduction 4+/5 5/5 5/5  Shoulder internal rotation 4/5 5/5 5/5  Shoulder external rotation 4/5 5/5 5/5  Middle trapezius 3/5 4/5   Lower trapezius Not tested 3/5   Latissimus dorsi 3/5 4/5   Elbow flexion 5/5 5/5   Elbow extension 5/5 5/5   Grip strength (lbs)       (Blank rows = not tested)     JOINT MOBILITY TESTING:  WNL GHJ mobility in all planes BIL   PALPATION:  No TTP about Rt shoulder surgical incisions             TODAY'S TREATMENT:   [facility] Adult PT Treatment:                                                DATE: 03/01/2022 Therapeutic Exercise: Standing BIL chest fly with 7# cables with slow  return, not extending past torso 3x10 Standing scapular "W" with 3# cables 3x10 Standing shoulder press at 120 degrees shoulder flexion with 3# cables 3x10 Standing biceps pull-downs x10 with 3# cables, 2x10 with 7# cables Standing black physioball roll up wall with alternating low trap lift-offs at end range flexion 3x20 Seated low rows with 45# cable 2x10 Seated high rows with 45# cable 2x10 Seated shoulder rolls 2x10 forward and backward Standing BIL shoulder extension with scapular retraction with 7# cables 3x10 Standing Rt shoulder cross-body adduction stretch x50mn Manual Therapy: N/A Neuromuscular re-ed: N/A Therapeutic Activity: Standing Rt scaption stepwise lift from counter top to chest-level shelf, then to overhead shelf, and back again x10 with 4# dumbbell, x10 with 3# dumbbell Modalities: N/A Self Care: N/A   [facility]                                                [facility]: 02/27/2022 Therapeutic Exercise: Standing biceps pull-downs with 7# cables 3x10 Bent-over single-arm rear delt flies with 3# cable 3x10 BIL Seated low rows with 45# cable 2x10 Seated high rows with 45# cable 2x10 Seated shoulder rolls 2x10 forward and backward Standing 90/90 shoulder IR with GTB 3x10 Standing 90/90 shoulder ER with  GTB 3x10 Seated Rt shoulder ER AAROM with dowel rod 2x10 with 5-sec hold Seated Rt shoulder IR stretch with towel 2x10 with 5-sec hold with cues to avoid painful range Seated cross-body shoulder adduction stretch 2x159m with cues to avoid painful range Manual Therapy: N/A Neuromuscular re-ed: N/A Therapeutic Activity: Standing scaption stepwise lift from counter top to chest-level shelf, then to overhead shelf, and back again 2x10 on Rt with 3# dumbbell Modalities: N/A Self Care: N/A      PATIENT EDUCATION: Education details: Pt educated on underlying pathophysiology, prognosis, protocol, POC, QuickDASH, and HEP Person educated: Patient and Parent Education method: Explanation, Demonstration, and Handouts Education comprehension: verbalized understanding and returned demonstration     HOME EXERCISE PROGRAM: Access Code: [email or web address]: [email or web address] Date: 01/23/2022 Prepared by: StEvelene CroonExercises - Standing Shoulder Scaption 45-90 degrees  - 1 x daily - 7 x weekly - 3 sets - 10 reps - Seated Single Arm Elbow Flexion with Resistance  - 1 x daily - 7 x weekly - 2 sets - 10 reps - Shoulder External Rotation with Resistance  - 1 x daily - 7 x weekly - 3 sets - 10 reps - Standing Isometric Shoulder Internal Rotation at Doorway  - 1 x daily - 7 x weekly - 3 sets - 10 reps - 5 seconds hold  Patient Education - Scar Massage   ASSESSMENT:   CLINICAL IMPRESSION: Pt checked in at the front desk but left prior to being seen by PT due to having a severe headache. No treatment or assessment was performed today.    OBJECTIVE IMPAIRMENTS decreased activity tolerance, decreased mobility, decreased ROM, decreased strength, hypomobility, increased edema, impaired flexibility, impaired UE functional use, improper body mechanics, postural dysfunction, and pain.    ACTIVITY LIMITATIONS carrying, lifting, sleeping, bathing, dressing, reach over head, and  hygiene/grooming   PARTICIPATION LIMITATIONS: meal prep, cleaning, laundry, driving, shopping, community activity, occupation, and yard work   PERSONAL FACTORS 3+ comorbidities: See medical hx  are also affecting patient's functional outcome.        GOALS: Goals reviewed with patient? Yes  SHORT TERM GOALS: Target date: 02/06/2022     Pt will report understanding and adherence to initial HEP in order to promote independence in the management of primary impairments. Baseline: HEP provided at eval 01/25/2022: Pt reports adherence to his HEP Goal status: ACHIEVED     LONG TERM GOALS: Target date: 03/06/2022    Pt will achieve a QuickDASH score of 30% or less in order to demonstrate improved functional ability as it relates to his shoulder impairments. Baseline: 49% 01/30/2022: 38% 02/20/2022: 31% Goal status: NEARLY MET   2.  Pt will demonstrate Rt shoulder elevation AROM of 150 degrees in order to get dressed with less limitation. Baseline: Flexion: 110, abduction: 102 01/25/2022: Flexion: 130, abduction: 119 02/06/2022: Flexion and abduction to 137 degrees 02/20/2022: Flexion 152, abduction 155 Goal status: ACHIEVED   3.  Pt will demonstrate ability to lift 10# overhead with Rt UE in order to return to working with less limitation. Baseline: Unable to lift any amount of weight overhead 02/06/2022: Pt demonstrate ability to lift 2# dumbbell overhead with Rt UE Goal status: IN PROGRESS   4.  Pt will achieve global Rt shoulder strength of 5/5 in order to return to working out independently with less limitation. Baseline: See MMT chart 02/06/2022: 5/5 globally Goal status: ACHIEVED   5.  Pt will report worst pain of 0-3/10 in order to complete household ADLs with less limitation. Baseline: worst pain 8/10 01/25/2022: worst pain 6/10  02/20/2022: Worst pain 2/10 Goal status: ACHIEVED       PLAN: PT FREQUENCY: 1-2x/week   PT DURATION: 8 weeks   PLANNED INTERVENTIONS: Therapeutic  exercises, Therapeutic activity, Neuromuscular re-education, Patient/Family education, Self Care, Joint mobilization, Aquatic Therapy, Dry Needling, Electrical stimulation, Spinal manipulation, Spinal mobilization, Cryotherapy, Moist heat, scar mobilization, Taping, Vasopneumatic device, Biofeedback, Ionotophoresis 58m/ml Dexamethasone, Manual therapy, and Re-evaluation   PLAN FOR NEXT SESSION: Progress early shoulder ROM and strengthening in accordance with [facility] post-op protocol   YVanessa Hazel Dell PT, DPT 03/06/22 2:05 PM

## 2022-03-08 ENCOUNTER — Ambulatory Visit: Payer: Medicaid Other

## 2022-03-08 ENCOUNTER — Other Ambulatory Visit: Payer: Self-pay | Admitting: Physician Assistant

## 2022-03-08 DIAGNOSIS — M6281 Muscle weakness (generalized): Secondary | ICD-10-CM

## 2022-03-08 DIAGNOSIS — R600 Localized edema: Secondary | ICD-10-CM

## 2022-03-08 DIAGNOSIS — G8929 Other chronic pain: Secondary | ICD-10-CM

## 2022-03-08 DIAGNOSIS — M25511 Pain in right shoulder: Secondary | ICD-10-CM | POA: Diagnosis not present

## 2022-03-08 DIAGNOSIS — M25611 Stiffness of right shoulder, not elsewhere classified: Secondary | ICD-10-CM

## 2022-03-08 NOTE — Therapy (Signed)
OUTPATIENT PHYSICAL THERAPY TREATMENT NOTE/ DISCHARGE SUMMARY   Patient Name: Matthew [name] MRN: [id number] DOB:02-11-1976, 46 y.o., male Today's Date: 03/08/2022  PCP: [name], MD REFERRING PROVIDER: Marchia Bond, MD  END OF SESSION:   PT End of Session - 03/08/22 1300     Visit Number 14    Number of Visits 17    Date for PT Re-Evaluation 03/14/22    Authorization Type [facility] [employer]    Authorization Time Period 01/30/2022-03/12/2022    Authorization - Visit Number 9    Authorization - Number of Visits 12    PT Start Time 1300    PT Stop Time 1325    PT Time Calculation (min) 25 min    Activity Tolerance Patient tolerated treatment well    Behavior During Therapy WFL for tasks assessed/performed                        Past Medical History:  Diagnosis Date   Anxiety    Arthritis    Celiac disease    Coronary artery disease    Dairy allergy    Depression    Diabetes mellitus without complication ([place])    High cholesterol    Hypertension    Hypothyroidism    Myocardial infarction ([place])    Paranoia ([place])    Schizoaffective disorder ([place])    Past Surgical History:  Procedure Laterality Date   APPENDECTOMY     CARDIAC CATHETERIZATION     CORONARY STENT INTERVENTION N/A 06/06/2020   Procedure: CORONARY STENT INTERVENTION;  Surgeon: Martinique, Peter M, MD;  Location: [facility];  Service: Cardiovascular;  Laterality: N/A;  prox LAD, distal RCA   INTRAVASCULAR ULTRASOUND/IVUS N/A 06/06/2020   Procedure: Intravascular Ultrasound/IVUS;  Surgeon: Martinique, Peter M, MD;  Location: [facility];  Service: Cardiovascular;  Laterality: N/A;   LEFT HEART CATH AND CORONARY ANGIOGRAPHY N/A 06/06/2020   Procedure: LEFT HEART CATH AND CORONARY ANGIOGRAPHY;  Surgeon: Martinique, Peter M, MD;  Location: [place] [facility];  Service: Cardiovascular;  Laterality: N/A;   LEFT HEART CATH AND CORONARY ANGIOGRAPHY N/A 02/24/2021   Procedure: LEFT HEART CATH AND  CORONARY ANGIOGRAPHY;  Surgeon: Belva Crome, MD;  Location: [facility];  Service: Cardiovascular;  Laterality: N/A;   NASAL SEPTUM SURGERY     TOTAL SHOULDER ARTHROPLASTY Right 11/28/2021   Procedure: TOTAL SHOULDER ARTHROPLASTY;  Surgeon: Marchia Bond, MD;  Location: [facility];  Service: Orthopedics;  Laterality: Right;   WRIST FRACTURE SURGERY     right wrist   Patient Active Problem List   Diagnosis Date Noted   S/P shoulder replacement, right 11/28/2021   Atypical chest pain 04/23/2021   Syncope 04/23/2021   Chronic hyponatremia 04/23/2021   Leukocytosis 04/23/2021   Right rib fracture 04/23/2021   High cholesterol    Tobacco abuse    Unstable angina ([place]) 02/24/2021   Right-sided chest pain 09/11/2020   Coronary artery disease of bypass graft of native heart with stable angina pectoris ([place])    Uncontrolled type 2 diabetes mellitus with hyperglycemia ([place])    Hyponatremia    Essential hypertension    History of hyperprolactinemia 08/16/2020   Mixed dyslipidemia 08/15/2020   Chest pain 06/06/2020   Type 2 diabetes mellitus with hyperglycemia ([place]) 06/06/2020   Hyperkalemia 06/06/2020   Thrombocytopenia ([place]) 06/06/2020   Obesity (BMI 30.0-34.9) 06/06/2020   Non-ST elevation (NSTEMI) myocardial infarction [facility])    Attention deficit hyperactivity disorder (  ADHD), combined type, moderate 03/10/2018   Generalized anxiety disorder 03/10/2018   Schizoaffective disorder, bipolar type without good prognostic features ([place]) 02/09/2015   Morbid obesity ([place]) 01/18/2015   OSA (obstructive sleep apnea) 07/14/2014   Solitary pulmonary nodule 07/14/2014    REFERRING DIAG: s/p right shoulder replacement 11/28/2021  THERAPY DIAG:  Chronic right shoulder pain  Stiffness of right shoulder, not elsewhere classified  Localized edema  Muscle weakness (generalized)  Rationale for Evaluation and Treatment Rehabilitation  PERTINENT HISTORY: Rt TSA on 11/28/2021, HTN, hx of MI  05/2020, DMII, anxiety, depression, paranoia, Schizoaffective disorder, coronary stint interventions in 2022  PRECAUTIONS: Shoulder, s/p Rt TSA 11/28/2021, utilizing [facility] orthopedic specialists TSA protocol in lieu of post-op protocol   SUBJECTIVE: Pt reports he is still having a headache and sinus pressure today and would like to take the session slowly. He reports he is ready to be discharged from PT at this time due to independence with his HEP and feeling much better. Pt asks to end the session early today due to his headache.  PAIN:  Are you having pain? Yes: NPRS scale: 0/10 Pain location: Rt shoulder Pain description: Achy Aggravating factors: Rt shoulder flexion and abduction, laying on Rt side Relieving factors: pain medication, ice, and rest   OBJECTIVE: (objective measures completed at initial evaluation unless otherwise dated)   DIAGNOSTIC FINDINGS:  11/28/2021: DG Shoulder Right Port: IMPRESSION: Interval total right shoulder arthroplasty without evidence of hardware failure.   09/26/2021: MR Shoulder Right With Contrast: IMPRESSION: 1. Age advanced glenohumeral degenerative changes with diffuse chondral thinning and prominent osteophytes of the humeral head inferiorly. 2. No specific signs of recent glenohumeral dislocation. There is no evidence of Hill-Sachs deformity or anterior labroligamentous injury. The labrum is diffusely degenerated, especially posteriorly. 3. Intact rotator cuff with mild infraspinatus tendinosis.   PATIENT SURVEYS:  [name] 27/55, 49% disability  Quick Dash (01/30/2022): 21/55, 38% disability  Quick Dash (02/20/2022): 17/55, 31% disability  Quick Dash (03/08/2022): 13/55, 24% disability   COGNITION:           Overall cognitive status: Within functional limits for tasks assessed                                  SENSATION: Not tested   POSTURE: Forward head and rounded shoulders   UPPER EXTREMITY ROM:    A/PROM Right eval  Left eval Right 01/25/2022 Right 02/06/2022 Right 02/08/2022 Right 02/20/2022  Shoulder flexion 110p! 180 130p! 137p!/ 140p! 155 152  Shoulder abduction 102p! 180 119p! 137p!/ 160 150 155  Shoulder internal rotation 45 70/80 45 57/70 58 65  Shoulder external rotation 40 77/90 40 50/55 52 46  (Blank rows = not tested)   UPPER EXTREMITY MMT:   MMT Right eval Left eval Right 02/06/2022  Shoulder flexion 4+/5 5/5 5/5  Shoulder abduction 4+/5 5/5 5/5  Shoulder internal rotation 4/5 5/5 5/5  Shoulder external rotation 4/5 5/5 5/5  Middle trapezius 3/5 4/5   Lower trapezius Not tested 3/5   Latissimus dorsi 3/5 4/5   Elbow flexion 5/5 5/5   Elbow extension 5/5 5/5   Grip strength (lbs)       (Blank rows = not tested)     JOINT MOBILITY TESTING:  WNL GHJ mobility in all planes BIL   PALPATION:  No TTP about Rt shoulder surgical incisions  TODAY'S TREATMENT:   [facility] Adult PT Treatment:                                                DATE: 03/08/2022 Therapeutic Exercise: Standing Lat pull-down with blue band 2x10 Standing BIL shoulder extension with RTB 2x10 Standing row with blue band 2x10 Manual Therapy: N/A Neuromuscular re-ed: N/A Therapeutic Activity: Re-assessment of objective measures with pt education Re-administration of QuickDASH with pt education Update to HEP with demonstration and instruction Modalities: N/A Self Care: N/A   [facility] Adult PT Treatment:                                                DATE: 03/01/2022 Therapeutic Exercise: Standing BIL chest fly with 7# cables with slow return, not extending past torso 3x10 Standing scapular "W" with 3# cables 3x10 Standing shoulder press at 120 degrees shoulder flexion with 3# cables 3x10 Standing biceps pull-downs x10 with 3# cables, 2x10 with 7# cables Standing black physioball roll up wall with alternating low trap lift-offs at end range flexion 3x20 Seated low rows with 45# cable 2x10 Seated  high rows with 45# cable 2x10 Seated shoulder rolls 2x10 forward and backward Standing BIL shoulder extension with scapular retraction with 7# cables 3x10 Standing Rt shoulder cross-body adduction stretch x41mn Manual Therapy: N/A Neuromuscular re-ed: N/A Therapeutic Activity: Standing Rt scaption stepwise lift from counter top to chest-level shelf, then to overhead shelf, and back again x10 with 4# dumbbell, x10 with 3# dumbbell Modalities: N/A Self Care: N/A   [facility]                                                [facility]: 02[facility]27/2022 Therapeutic Exercise: Standing biceps pull-downs with 7# cables 3x10 Bent-over single-arm rear delt flies with 3# cable 3x10 BIL Seated low rows with 45# cable 2x10 Seated high rows with 45# cable 2x10 Seated shoulder rolls 2x10 forward and backward Standing 90/90 shoulder IR with GTB 3x10 Standing 90/90 shoulder ER with GTB 3x10 Seated Rt shoulder ER AAROM with dowel rod 2x10 with 5-sec hold Seated Rt shoulder IR stretch with towel 2x10 with 5-sec hold with cues to avoid painful range Seated cross-body shoulder adduction stretch 2x141m with cues to avoid painful range Manual Therapy: N/A Neuromuscular re-ed: N/A Therapeutic Activity: Standing scaption stepwise lift from counter top to chest-level shelf, then to overhead shelf, and back again 2x10 on Rt with 3# dumbbell Modalities: N/A Self Care: N/A      PATIENT EDUCATION: Education details: Pt educated on underlying pathophysiology, prognosis, protocol, POC, QuickDASH, and HEP Person educated: Patient and Parent Education method: Explanation, Demonstration, and Handouts Education comprehension: verbalized understanding and returned demonstration     HOME EXERCISE PROGRAM: Access Code: [email or web address]: [email or web address] Date: 01/23/2022 Prepared by: StEvelene CroonExercises - Standing Shoulder Scaption 45-90 degrees  - 1 x daily - 7 x weekly - 3  sets - 10 reps - Seated Single Arm Elbow Flexion with Resistance  - 1 x daily - 7 x weekly - 2 sets - 10 reps - Shoulder  External Rotation with Resistance  - 1 x daily - 7 x weekly - 3 sets - 10 reps - Standing Isometric Shoulder Internal Rotation at Doorway  - 1 x daily - 7 x weekly - 3 sets - 10 reps - 5 seconds hold  Patient Education - Scar Massage   ASSESSMENT:   CLINICAL IMPRESSION: Upon re-assessment of objective measures, the pt has met all of his functional rehab goals. He reports readiness for discharge from PT at this time to continue his updated HEP. Pt is discharged from PT at this time.   OBJECTIVE IMPAIRMENTS decreased activity tolerance, decreased mobility, decreased ROM, decreased strength, hypomobility, increased edema, impaired flexibility, impaired UE functional use, improper body mechanics, postural dysfunction, and pain.    ACTIVITY LIMITATIONS carrying, lifting, sleeping, bathing, dressing, reach over head, and hygiene/grooming   PARTICIPATION LIMITATIONS: meal prep, cleaning, laundry, driving, shopping, community activity, occupation, and yard work   PERSONAL FACTORS 3+ comorbidities: See medical hx  are also affecting patient's functional outcome.        GOALS: Goals reviewed with patient? Yes   SHORT TERM GOALS: Target date: 02/06/2022     Pt will report understanding and adherence to initial HEP in order to promote independence in the management of primary impairments. Baseline: HEP provided at eval 01/25/2022: Pt reports adherence to his HEP Goal status: ACHIEVED     LONG TERM GOALS: Target date: 03/06/2022    Pt will achieve a QuickDASH score of 30% or less in order to demonstrate improved functional ability as it relates to his shoulder impairments. Baseline: 49% 01/30/2022: 38% 02/20/2022: 31% 03/08/2022: 24% Goal status: ACHIEVED   2.  Pt will demonstrate Rt shoulder elevation AROM of 150 degrees in order to get dressed with less  limitation. Baseline: Flexion: 110, abduction: 102 01/25/2022: Flexion: 130, abduction: 119 02/06/2022: Flexion and abduction to 137 degrees 02/20/2022: Flexion 152, abduction 155 Goal status: ACHIEVED   3.  Pt will demonstrate ability to lift 10# overhead with Rt UE in order to return to working with less limitation. Baseline: Unable to lift any amount of weight overhead 02/06/2022: Pt demonstrate ability to lift 2# dumbbell overhead with Rt UE 03/08/2022: Pt able to lift 10# overhead with his Rt UE Goal status: ACHIEVED   4.  Pt will achieve global Rt shoulder strength of 5/5 in order to return to working out independently with less limitation. Baseline: See MMT chart 02/06/2022: 5/5 globally Goal status: ACHIEVED   5.  Pt will report worst pain of 0-3/10 in order to complete household ADLs with less limitation. Baseline: worst pain 8/10 01/25/2022: worst pain 6/10  02/20/2022: Worst pain 2/10 Goal status: ACHIEVED       PLAN: PT FREQUENCY: 1-2x/week   PT DURATION: 8 weeks   PLANNED INTERVENTIONS: Therapeutic exercises, Therapeutic activity, Neuromuscular re-education, Patient/Family education, Self Care, Joint mobilization, Aquatic Therapy, Dry Needling, Electrical stimulation, Spinal manipulation, Spinal mobilization, Cryotherapy, Moist heat, scar mobilization, Taping, Vasopneumatic device, Biofeedback, Ionotophoresis 45m/ml Dexamethasone, Manual therapy, and Re-evaluation   PLAN FOR NEXT SESSION: Pt nis discharged from PT at this time.   PHYSICAL THERAPY DISCHARGE SUMMARY  Visits from Start of Care: 14  Current functional level related to goals / functional outcomes: Pt has met all of his rehab goals.   Remaining deficits: N/A   Education / Equipment: HEP   Patient agrees to discharge. Patient goals were met. Patient is being discharged due to meeting the stated rehab goals.   YVanessa Lidderdale PT, DPT 03/08/22  1:25 PM

## 2022-03-08 NOTE — Telephone Encounter (Signed)
Pt has requested a RF on Klonopin. RF is due over the weekend per pt at [facility]

## 2022-03-09 ENCOUNTER — Other Ambulatory Visit: Payer: Self-pay | Admitting: Physician Assistant

## 2022-03-09 DIAGNOSIS — F250 Schizoaffective disorder, bipolar type: Secondary | ICD-10-CM

## 2022-03-15 ENCOUNTER — Other Ambulatory Visit: Payer: Self-pay | Admitting: Physician Assistant

## 2022-03-15 NOTE — Telephone Encounter (Signed)
Ok to send

## 2022-03-19 ENCOUNTER — Encounter: Payer: Self-pay | Admitting: Internal Medicine

## 2022-03-19 ENCOUNTER — Ambulatory Visit (INDEPENDENT_AMBULATORY_CARE_PROVIDER_SITE_OTHER): Payer: Medicaid Other | Admitting: Internal Medicine

## 2022-03-19 VITALS — BP 136/84 | HR 93 | Ht 75.0 in | Wt 275.0 lb

## 2022-03-19 DIAGNOSIS — E1165 Type 2 diabetes mellitus with hyperglycemia: Secondary | ICD-10-CM | POA: Diagnosis not present

## 2022-03-19 DIAGNOSIS — G63 Polyneuropathy in diseases classified elsewhere: Secondary | ICD-10-CM

## 2022-03-19 DIAGNOSIS — E782 Mixed hyperlipidemia: Secondary | ICD-10-CM | POA: Diagnosis not present

## 2022-03-19 DIAGNOSIS — E1142 Type 2 diabetes mellitus with diabetic polyneuropathy: Secondary | ICD-10-CM

## 2022-03-19 DIAGNOSIS — Z794 Long term (current) use of insulin: Secondary | ICD-10-CM

## 2022-03-19 LAB — POCT GLYCOSYLATED HEMOGLOBIN (HGB A1C): Hemoglobin A1C: 8.8 % — AB (ref 4.0–5.6)

## 2022-03-19 LAB — POCT GLUCOSE (DEVICE FOR HOME USE): POC Glucose: 226 mg/dl — AB (ref 70–99)

## 2022-03-19 MED ORDER — INSULIN PEN NEEDLE 32G X 4 MM MISC: 1.0000 | Freq: Every day | 3 refills | Status: DC

## 2022-03-19 MED ORDER — EMPAGLIFLOZIN 25 MG PO TABS: 25.0000 mg | ORAL_TABLET | Freq: Every day | ORAL | 3 refills | Status: DC

## 2022-03-19 MED ORDER — DEXCOM G7 SENSOR MISC: 1.0000 | 3 refills | Status: DC

## 2022-03-19 MED ORDER — LANTUS SOLOSTAR 100 UNIT/ML ~~LOC~~ SOPN: 25.0000 [IU] | PEN_INJECTOR | Freq: Every day | SUBCUTANEOUS | 3 refills | Status: DC

## 2022-03-19 MED ORDER — METFORMIN HCL 1000 MG PO TABS: 1000.0000 mg | ORAL_TABLET | Freq: Two times a day (BID) | ORAL | 3 refills | Status: DC

## 2022-03-19 NOTE — Patient Instructions (Signed)
-   Continue Metformin 1000 mg , 1 tablet before Breakfast and 1 tablet before Supper  - Continue  Jardiance 25 mg, 1 tablet before Breakfast  - Increase  Lantus 25  units once daily      HOW TO TREAT LOW BLOOD SUGARS (Blood sugar LESS THAN 70 MG/DL) Please follow the RULE OF 15 for the treatment of hypoglycemia treatment (when your (blood sugars are less than 70 mg/dL)   STEP 1: Take 15 grams of carbohydrates when your blood sugar is low, which includes:  3-4 GLUCOSE TABS  OR 3-4 OZ OF JUICE OR REGULAR SODA OR ONE TUBE OF GLUCOSE GEL    STEP 2: RECHECK blood sugar in 15 MINUTES STEP 3: If your blood sugar is still low at the 15 minute recheck --> then, go back to STEP 1 and treat AGAIN with another 15 grams of carbohydrates.

## 2022-03-19 NOTE — Progress Notes (Unsigned)
Name: Matthew [name]  Age/ Sex: 46 y.o., male   MRN/ DOB: [id number], 1975/08/25     PCP: [name], MD   Reason for Endocrinology Evaluation: Type 2 Diabetes Mellitus  Initial Endocrine Consultative Visit: 08/15/2020    PATIENT IDENTIFIER: [name] is a 46 y.o. male with a past medical history of T2DN, CAD , OSA and schizoaffective d/o. The patient has followed with Endocrinology clinic since 08/15/2020 for consultative assistance with management of his diabetes.  DIABETIC HISTORY:  Matthew [name] was diagnosed with DM 2021,he has been on metformin and jardiance . His hemoglobin A1c has ranged from 6.4% in 2020, peaking at 12.8% in 2022.  Lives alone    Pt on disability for mental health  SUBJECTIVE:   During the last visit (09/18/2021: A1c 8.1 %  Today (03/20/2022): Matthew [name] is here for a diabetes management.  He checks his blood sugars 1 times daily.  S/P right  shoulder sx 11/2021 Has occasional vomiting  Admits to dietary indiscretions  Has noted tingling of the feet , he is already on Lyrica and Cymbalta , used to follow up with p management but not any more     HOME DIABETES REGIMEN:  Metformin 1000 mg , 1 tablet before Breakfast and 1 tablet before Supper Jardiance 25 mg, 1 tablet before Breakfast Lantus  20 units daily      Statin: yes ACE-I/ARB: yes   METER DOWNLOAD SUMMARY: Does not check    DIABETIC COMPLICATIONS: Microvascular complications:   Denies: CKD, retinopathy, neuropathy  Last Eye Exam: Completed 2022  Macrovascular complications:  CAD Denies: CVA, PVD   HISTORY:  Past Medical History:  Past Medical History:  Diagnosis Date   Anxiety    Arthritis    Celiac disease    Coronary artery disease    Dairy allergy    Depression    Diabetes mellitus without complication ([place])    High cholesterol    Hypertension    Hypothyroidism    Myocardial infarction ([place])    Paranoia ([place])    Schizoaffective disorder ([place])    Past  Surgical History:  Past Surgical History:  Procedure Laterality Date   APPENDECTOMY     CARDIAC CATHETERIZATION     CORONARY STENT INTERVENTION N/A 06/06/2020   Procedure: CORONARY STENT INTERVENTION;  Surgeon: Martinique, Peter M, MD;  Location: [facility];  Service: Cardiovascular;  Laterality: N/A;  prox LAD, distal RCA   INTRAVASCULAR ULTRASOUND/IVUS N/A 06/06/2020   Procedure: Intravascular Ultrasound/IVUS;  Surgeon: Martinique, Peter M, MD;  Location: [facility];  Service: Cardiovascular;  Laterality: N/A;   LEFT HEART CATH AND CORONARY ANGIOGRAPHY N/A 06/06/2020   Procedure: LEFT HEART CATH AND CORONARY ANGIOGRAPHY;  Surgeon: Martinique, Peter M, MD;  Location: [facility];  Service: Cardiovascular;  Laterality: N/A;   LEFT HEART CATH AND CORONARY ANGIOGRAPHY N/A 02/24/2021   Procedure: LEFT HEART CATH AND CORONARY ANGIOGRAPHY;  Surgeon: Belva Crome, MD;  Location: [facility];  Service: Cardiovascular;  Laterality: N/A;   NASAL SEPTUM SURGERY     TOTAL SHOULDER ARTHROPLASTY Right 11/28/2021   Procedure: TOTAL SHOULDER ARTHROPLASTY;  Surgeon: Marchia Bond, MD;  Location: [facility];  Service: Orthopedics;  Laterality: Right;   WRIST FRACTURE SURGERY     right wrist   Social History:  reports that he has been smoking cigarettes and e-cigarettes. He has been smoking an average of 1 pack per day. He has never used smokeless tobacco.  He reports that he does not drink alcohol and does not use drugs. Family History:  Family History  Problem Relation Age of Onset   Asthma Mother    Hypertension Mother    Hypertension Father      HOME MEDICATIONS: Allergies as of 03/19/2022       Reactions   Gluten Meal Other (See Comments)   Celiac disease   Lactose Intolerance (gi) Diarrhea   Can tolerate hard cheese        Medication List        Accurate as of March 19, 2022 11:59 PM. If you have any questions, ask your nurse or doctor.          STOP taking  these medications    HYDROcodone-acetaminophen 10-325 MG tablet Commonly known as: Norco Stopped by: Dorita Sciara, MD       TAKE these medications    aspirin EC 81 MG tablet Take 1 tablet (81 mg total) by mouth daily. Swallow whole.   atomoxetine 60 MG capsule Commonly known as: Strattera Take 1 capsule (60 mg total) by mouth daily.   atorvastatin 80 MG tablet Commonly known as: LIPITOR Take 1 tablet (80 mg total) by mouth every morning.   carvedilol 25 MG tablet Commonly known as: COREG Take 1.5 tablets (37.5 mg total) by mouth 2 (two) times daily.   clonazePAM 1 MG tablet Commonly known as: KLONOPIN Take 1 tablet (1 mg total) by mouth 2 (two) times daily as needed for anxiety.   clopidogrel 75 MG tablet Commonly known as: PLAVIX Take 1 tablet (75 mg total) by mouth daily.   CO Q-10 PO Take 1 capsule by mouth daily.   Dexcom G7 Sensor Misc 1 Device by Does not apply route as directed. Started by: Dorita Sciara, MD   DULoxetine 30 MG capsule Commonly known as: CYMBALTA Take 1 capsule (30 mg total) by mouth daily. Take 1 with the 60 mg=90 mg daily   DULoxetine 60 MG capsule Commonly known as: CYMBALTA TAKE 1 CAPSULE (60 MG TOTAL) BY MOUTH DAILY. TAKE WITH 30 MG=90 MG DAILY.   empagliflozin 25 MG Tabs tablet Commonly known as: Jardiance Take 1 tablet (25 mg total) by mouth daily before breakfast.   fenofibrate 145 MG tablet Commonly known as: Tricor Take 1 tablet (145 mg total) by mouth daily.   icosapent Ethyl 1 g capsule Commonly known as: VASCEPA Take 2 capsules (2 g total) by mouth 2 (two) times daily.   Insulin Pen Needle 32G X 4 MM Misc 1 Device by Does not apply route daily.   Lantus SoloStar 100 UNIT/ML Solostar Pen Generic drug: insulin glargine Inject 25 Units into the skin daily. What changed: how much to take Changed by: Dorita Sciara, MD   levothyroxine 25 MCG tablet Commonly known as: SYNTHROID TAKE 1 TABLET IN  THE MORNING WITH A GLASS OF WATER, WAIT 1 HOUR BEFORE TAKING OTHER MEDS OR EATING   lithium carbonate 300 MG CR tablet Commonly known as: LITHOBID TAKE 2 TABLETS BY MOUTH TWICE A DAY   metFORMIN 1000 MG tablet Commonly known as: GLUCOPHAGE Take 1 tablet (1,000 mg total) by mouth 2 (two) times daily with a meal.   nitroGLYCERIN 0.4 MG SL tablet Commonly known as: Nitrostat Place 1 tablet (0.4 mg total) under the tongue every 5 (five) minutes as needed for chest pain.   ondansetron 4 MG tablet Commonly known as: Zofran Take 1 tablet (4 mg total) by mouth every 8 (  eight) hours as needed for nausea or vomiting.   Osteo Bi-Flex One Per Day Tabs Take 1 tablet by mouth daily.   perphenazine 16 MG tablet Commonly known as: TRILAFON TAKE 1 TABLET EVERY MORNING AND 2 TABLETS AT BEDTIME   pregabalin 100 MG capsule Commonly known as: LYRICA Take 1 capsule (100 mg total) by mouth 2 (two) times daily.   sennosides-docusate sodium 8.6-50 MG tablet Commonly known as: SENOKOT-S Take 2 tablets by mouth daily.   spironolactone 25 MG tablet Commonly known as: ALDACTONE Take 0.5 tablets (12.5 mg total) by mouth daily.   SUDAFED 12 HOUR PO Take 1 tablet by mouth daily as needed (congestion).   traZODone 100 MG tablet Commonly known as: DESYREL Take 3 tablets (300 mg total) by mouth at bedtime.   TURMERIC CURCUMIN PO Take 1 capsule by mouth daily.         OBJECTIVE:   Vital Signs: BP 136/84 (BP Location: Left Arm, Patient Position: Sitting, Cuff Size: Large)   Pulse 93   Ht 6' 3"  (1.905 m)   Wt 275 lb (124.7 kg)   SpO2 94%   BMI 34.37 kg/m   Wt Readings from Last 3 Encounters:  03/19/22 275 lb (124.7 kg)  11/28/21 258 lb (117 kg)  11/17/21 258 lb (117 kg)     Exam: General: Pt appears well and is in NAD  Lungs: Clear with good BS bilat   Heart: RRR   Abdomen: soft, nontender, without masses or organomegaly palpable  Extremities: No pretibial edema.   Neuro: MS is  good with appropriate affect, pt is alert and Ox3    DM foot exam: 09/18/2021   The skin of the feet is without sores or ulcerations, callous formation at the heels The pedal pulses are undetectable  The sensation is intact to a screening 5.07, 10 gram monofilament bilaterally       DATA REVIEWED:  Lab Results  Component Value Date   HGBA1C 8.8 (A) 03/19/2022   HGBA1C 7.3 (H) 11/17/2021   HGBA1C 8.1 (A) 09/18/2021   Lab Results  Component Value Date   LDLCALC 54 01/29/2022   CREATININE 1.18 01/23/2022          Latest Reference Range & Units 01/29/22 08:09  Apolipoprotein B <90 mg/dL 75  Total CHOL/HDL Ratio 0.0 - 5.0 ratio 4.3  Cholesterol, Total 100 - 199 mg/dL 115  HDL Cholesterol >39 mg/dL 27 (L)  Triglycerides 0 - 149 mg/dL 204 (H)  VLDL Cholesterol Cal 5 - 40 mg/dL 34  LDL Chol Calc (NIH) 0 - 99 mg/dL 54    ASSESSMENT / PLAN / RECOMMENDATIONS:   1) Type 2 Diabetes Mellitus, Poorly  controlled, With neuropathic  complications - Most recent A1c of 8.8 %. Goal A1c < 7.0 %.     - A1c up from 7.3%  - Counseled about low carb diet and avoiding sugar-sweetened beverages  - Switching to Medicare, requests dexcom G7, prescription sent and sample provided  - Barriers  to diabetes self care is mental illness  - Will increase insulin  - NO hx of pancreaitits, will consider GLP-1 agonists in the future     MEDICATIONS: Continue Metformin 1000 mg , 1 tablet before Breakfast and 1 tablet before Supper  Continue  Jardiance 25 mg, 1 tablet before Breakfast  INcrease Lantus  25  units once daily   EDUCATION / INSTRUCTIONS: BG monitoring instructions: Patient is instructed to check his blood sugars 1 times a day. Call [facility]  Endocrinology clinic if: BG persistently < 70  I reviewed the Rule of 15 for the treatment of hypoglycemia in detail with the patient. Literature supplied.   2) Diabetic complications:  Eye: Does not have known diabetic retinopathy.  Neuro/ Feet:  Does not have known diabetic peripheral neuropathy .  Renal: Patient does not have known baseline CKD. He   is  on an ACEI/ARB at present.   3) Hypertriglyceridemia   - Tg level as high as 2311 mg/dL in 05/2020, repeat was 675 mg/dL in 07/2021 mg/dL, he was recently checked at 204 mg/dL   Continue atorvastatin 80 mg daily Continue Vascepa 2 caps BID      4) Peripheral Neuropathy:   - He is already on Lyrica and Cymbalta  - Emphasized importance of controlling glucose   F/U in 6 months     Signed electronically by: Mack Guise, MD  [facility]  [address] [place], [place] [address] Phone: [phone] FAX: [phone]   CC: [name], [place] [address] Phone: [phone]  Fax: [phone]  Return to Endocrinology clinic as below: Future Appointments  Date Time Provider [place]  03/21/2022 11:00 AM Donnal Moat T, PA-C [facility] None  05/15/2022  9:00 AM Freada Bergeron, MD [facility]  09/21/2022 11:30 AM Tiffiany Beadles, Melanie Crazier, MD [facility] None

## 2022-03-20 DIAGNOSIS — G63 Polyneuropathy in diseases classified elsewhere: Secondary | ICD-10-CM | POA: Insufficient documentation

## 2022-03-20 DIAGNOSIS — E1142 Type 2 diabetes mellitus with diabetic polyneuropathy: Secondary | ICD-10-CM | POA: Insufficient documentation

## 2022-03-21 ENCOUNTER — Ambulatory Visit (INDEPENDENT_AMBULATORY_CARE_PROVIDER_SITE_OTHER): Payer: Medicare HMO | Admitting: Physician Assistant

## 2022-03-21 ENCOUNTER — Encounter: Payer: Self-pay | Admitting: Physician Assistant

## 2022-03-21 ENCOUNTER — Telehealth: Payer: Self-pay | Admitting: Physician Assistant

## 2022-03-21 DIAGNOSIS — G2401 Drug induced subacute dyskinesia: Secondary | ICD-10-CM | POA: Diagnosis not present

## 2022-03-21 DIAGNOSIS — E032 Hypothyroidism due to medicaments and other exogenous substances: Secondary | ICD-10-CM

## 2022-03-21 DIAGNOSIS — F411 Generalized anxiety disorder: Secondary | ICD-10-CM | POA: Diagnosis not present

## 2022-03-21 DIAGNOSIS — F5105 Insomnia due to other mental disorder: Secondary | ICD-10-CM | POA: Diagnosis not present

## 2022-03-21 DIAGNOSIS — F17200 Nicotine dependence, unspecified, uncomplicated: Secondary | ICD-10-CM

## 2022-03-21 DIAGNOSIS — F250 Schizoaffective disorder, bipolar type: Secondary | ICD-10-CM

## 2022-03-21 DIAGNOSIS — F99 Mental disorder, not otherwise specified: Secondary | ICD-10-CM | POA: Diagnosis not present

## 2022-03-21 MED ORDER — DULOXETINE HCL 30 MG PO CPEP: 30.0000 mg | ORAL_CAPSULE | Freq: Every day | ORAL | 0 refills | Status: DC

## 2022-03-21 MED ORDER — AUSTEDO XR 24 MG PO TB24: 24.0000 mg | ORAL_TABLET | Freq: Every day | ORAL | 1 refills | Status: DC

## 2022-03-21 MED ORDER — CLONAZEPAM 1 MG PO TABS: 1.0000 mg | ORAL_TABLET | Freq: Three times a day (TID) | ORAL | 1 refills | Status: DC | PRN

## 2022-03-21 MED ORDER — DULOXETINE HCL 60 MG PO CPEP: 60.0000 mg | ORAL_CAPSULE | Freq: Every day | ORAL | 0 refills | Status: DC

## 2022-03-21 NOTE — Telephone Encounter (Signed)
[facility] called reporting [facility] until provider [place] calls them. She increased dose and sent new Rx today. Contact pt [phone] and pharmacy

## 2022-03-21 NOTE — Telephone Encounter (Signed)
Will call Friday to approve early refill he will use what he has until then

## 2022-03-21 NOTE — Progress Notes (Signed)
[facility]  Patient ID: Matthew [name],  MRN: [id number]  PCP: [name], MD  Date of Evaluation: 03/21/2022  time spent:40 minutes  Chief Complaint:  Chief Complaint   Anxiety; Depression; Stress    HISTORY/CURRENT STATUS: For routine f/u.   Today he wants to focus on the mouth movements.  This has been a problem for several months but he thought it was related to TMJ abnormality but feels like it is getting worse.  He has noticed himself making a chewing motion most all the time now that he is watching for it.  States this could be permanent and he wants to nip it in the bud before it gets worse.  He is not causing sores on his tongue or buccal mucosa.  Has a side-to-side motion of mandible and purses his lips a lot.  A month ago we increased the Strattera.  It has helped some.  He was on Adderall for many years so he is definitely not getting the same affect.  He has trouble sitting for any length of time reading or watching TV but it is a little better since the increase of Strattera.  He has been much more anxious.  In part due to family issues, the upcoming holidays, and the fact that he cannot work.  He feels panicky at times and then due to his cardiac history he sometimes gets worried.  The Klonopin helps a lot.  Patient has seen his cardiologist about the symptoms and they have reported it is not caused by his heart.  He has taken Xanax and Ativan in the past which he did not like because they work too quickly and do not last long enough.  He was on a higher dose of Klonopin until we started trying to wean in the past year.  If appropriate he would like to go back to a higher dose,even if just through the holidays.  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  Energy and motivation are fair to good depending on the day.  No extreme sadness, tearfulness, or feelings of hopelessness.  Sleeps well  most of the time. ADLs and personal hygiene are normal.  Appetite has not changed.  Weight is stable. Denies suicidal or homicidal thoughts.  Patient denies increased energy with decreased need for sleep, increased talkativeness, racing thoughts, impulsivity or risky behaviors, increased spending, increased libido, grandiosity, increased irritability or anger, paranoia, or hallucinations.  Review of Systems  Constitutional: Negative.   HENT: Negative.    Eyes: Negative.   Respiratory: Negative.    Cardiovascular: Negative.   Gastrointestinal: Negative.   Genitourinary: Negative.   Musculoskeletal:  Positive for back pain.  Skin: Negative.   Neurological:        Side-to-side mandibular motion and chewing   Endo/Heme/Allergies: Negative.   Psychiatric/Behavioral:         See HPI    Individual Medical History/ Review of Systems: Changes? :No      Past medications for mental health diagnoses include: Risperdal, Zyprexa, Abilify, [name], Klonopin, Gabapentin, Xanax, Valium, Ativan, Hydroxyzine, Trilafon, Atenolol, Cymbalta, Prozac, Lexapro, Paxil, Lithium, Adderall, Ritalin, Mirtazepine, Sonata was ineffective.  Allergies: Gluten meal and Lactose intolerance (gi)  Current Medications:  Current Outpatient Medications:    aspirin EC 81 MG tablet, Take 1 tablet (81 mg total) by mouth daily. Swallow whole., Disp: 90 tablet, Rfl: 3   atomoxetine (STRATTERA) 60 MG  capsule, Take 1 capsule (60 mg total) by mouth daily., Disp: 30 capsule, Rfl: 1   atorvastatin (LIPITOR) 80 MG tablet, Take 1 tablet (80 mg total) by mouth every morning., Disp: 90 tablet, Rfl: 3   Boswellia-Glucosamine-Vit D (OSTEO BI-FLEX ONE PER DAY) TABS, Take 1 tablet by mouth daily., Disp: , Rfl:    carvedilol (COREG) 25 MG tablet, Take 1.5 tablets (37.5 mg total) by mouth 2 (two) times daily., Disp: 270 tablet, Rfl: 3   clopidogrel (PLAVIX) 75 MG tablet, Take 1 tablet (75 mg total) by mouth daily., Disp: 90 tablet, Rfl: 3    Coenzyme Q10 (CO Q-10 PO), Take 1 capsule by mouth daily., Disp: , Rfl:    Continuous Blood Gluc Sensor (DEXCOM G7 SENSOR) MISC, 1 Device by Does not apply route as directed., Disp: 9 each, Rfl: 3   Deutetrabenazine ER (AUSTEDO XR) 24 MG TB24, Take 24 mg by mouth daily., Disp: 30 tablet, Rfl: 1   empagliflozin (JARDIANCE) 25 MG TABS tablet, Take 1 tablet (25 mg total) by mouth daily before breakfast., Disp: 90 tablet, Rfl: 3   fenofibrate (TRICOR) 145 MG tablet, Take 1 tablet (145 mg total) by mouth daily., Disp: 90 tablet, Rfl: 3   icosapent Ethyl (VASCEPA) 1 g capsule, Take 2 capsules (2 g total) by mouth 2 (two) times daily., Disp: 360 capsule, Rfl: 3   insulin glargine (LANTUS SOLOSTAR) 100 UNIT/ML Solostar Pen, Inject 25 Units into the skin daily., Disp: 30 mL, Rfl: 3   Insulin Pen Needle 32G X 4 MM MISC, 1 Device by Does not apply route daily., Disp: 150 each, Rfl: 3   levothyroxine (SYNTHROID) 25 MCG tablet, TAKE 1 TABLET IN THE MORNING WITH A GLASS OF WATER, WAIT 1 HOUR BEFORE TAKING OTHER MEDS OR EATING, Disp: 90 tablet, Rfl: 0   lithium carbonate (LITHOBID) 300 MG CR tablet, TAKE 2 TABLETS BY MOUTH TWICE A DAY, Disp: 360 tablet, Rfl: 0   metFORMIN (GLUCOPHAGE) 1000 MG tablet, Take 1 tablet (1,000 mg total) by mouth 2 (two) times daily with a meal., Disp: 180 tablet, Rfl: 3   ondansetron (ZOFRAN) 4 MG tablet, Take 1 tablet (4 mg total) by mouth every 8 (eight) hours as needed for nausea or vomiting., Disp: 10 tablet, Rfl: 0   perphenazine (TRILAFON) 16 MG tablet, TAKE 1 TABLET EVERY MORNING AND 2 TABLETS AT BEDTIME, Disp: 270 tablet, Rfl: 0   pregabalin (LYRICA) 100 MG capsule, Take 1 capsule (100 mg total) by mouth 2 (two) times daily., Disp: 60 capsule, Rfl: 1   Pseudoephedrine HCl (SUDAFED 12 HOUR PO), Take 1 tablet by mouth daily as needed (congestion)., Disp: , Rfl:    sennosides-docusate sodium (SENOKOT-S) 8.6-50 MG tablet, Take 2 tablets by mouth daily., Disp: 30 tablet, Rfl: 1    traZODone (DESYREL) 100 MG tablet, Take 3 tablets (300 mg total) by mouth at bedtime., Disp: 270 tablet, Rfl: 0   TURMERIC CURCUMIN PO, Take 1 capsule by mouth daily., Disp: , Rfl:    clonazePAM (KLONOPIN) 1 MG tablet, Take 1 tablet (1 mg total) by mouth 3 (three) times daily as needed for anxiety. Max 2.5 pills per day., Disp: 75 tablet, Rfl: 1   DULoxetine (CYMBALTA) 30 MG capsule, Take 1 capsule (30 mg total) by mouth daily. Take 1 with the 60 mg=90 mg daily, Disp: 90 capsule, Rfl: 0   DULoxetine (CYMBALTA) 60 MG capsule, Take 1 capsule (60 mg total) by mouth daily. Take with 30 mg=90 mg daily., Disp: 90  capsule, Rfl: 0   nitroGLYCERIN (NITROSTAT) 0.4 MG SL tablet, Place 1 tablet (0.4 mg total) under the tongue every 5 (five) minutes as needed for chest pain., Disp: 100 tablet, Rfl: 3   spironolactone (ALDACTONE) 25 MG tablet, Take 0.5 tablets (12.5 mg total) by mouth daily. (Patient not taking: Reported on 10/13/2021), Disp: 45 tablet, Rfl: 3 Medication Side Effects: none  Family Medical/ Social History: Changes? no  MENTAL HEALTH EXAM:  There were no vitals taken for this visit.There is no height or weight on file to calculate BMI.  General Appearance: Casual, Well Groomed, and Obese  Eye Contact:  Good  Speech:  Clear and Coherent and Normal Rate  Volume:  Normal  Mood:  Euthymic  Affect:  Congruent  Thought Process:  Goal Directed and Descriptions of Associations: Circumstantial  Orientation:  Full (Time, Place, and Person)  Thought Content: Logical   Suicidal Thoughts:  No  Homicidal Thoughts:  No  Memory:  WNL  Judgement:  Fair  Insight:  Fair  Psychomotor Activity: Side-to-side mandibular motion, also acts as if he is chewing gum but is not.  No abnormal movements otherwise  Concentration:  Concentration: Good and Attention Span: Fair  Recall:  Good  Fund of Knowledge: Good  Language: NA  Assets:  Desire for Improvement  ADL's:  Intact  Cognition: WNL  Prognosis:  [place] Office Visit from 03/21/2022 in [facility]  AIMS Total Score 17      PHQ2-9    Flowsheet Row CARDIAC REHAB PHASE II ORIENTATION from 08/16/2020 in [facility]  PHQ-2 Total Score 0      Flowsheet Row Admission (Discharged) from 11/28/2021 in [facility] from 11/17/2021 in [facility] from 07/04/2021 in [facility] No Risk No Risk No Risk      DIAGNOSES:    ICD-10-CM   1. Schizoaffective disorder, bipolar type ([place])  F25.0     2. Tardive dyskinesia  G24.01     3. Generalized anxiety disorder  F41.1     4. Insomnia due to other mental disorder  F51.05    F99     5. Hypothyroidism due to medication  E03.2     6. Smoker  F17.200       Receiving Psychotherapy: No   RECOMMENDATIONS:  PDMP reviewed.  Klonopin filled 03/09/2022.Marland Kitchen  Also on Lyrica and hydrocodone known to me. I provided 40 minutes of face to face time during this encounter, including time spent before and after the visit in records review, medical decision making, counseling pertinent to today's visit, and charting.   We discussed tardive dyskinesia.  He wants to start treatment now.  Options for treatment include Ingrezza or Austedo.  Benefits, risks, possible side effects were discussed and he accepts.  As far as the anxiety goes we will increase the Klonopin by 0.5 mg/day.  Briefly discussed tools to help treat and prevent anxiety.  The increase in Strattera last month has helped although minimal.  We can increase the dose but I prefer not to make any other changes with these other to that we are doing today.  He understands and agrees.  Smoking cessation discussed.  Continue Strattera 60 mg, 1 p.o. daily. Increase Klonopin 1 mg, 1 p.o. 3 times daily as needed but max 2.5 pills daily. Start  Austedo XR 4  week titration pack given.  Then 24 mg daily. Continue Cymbalta 90 mg daily.  Continue levothyroxine 25 mg every morning. Continue lithium CR 300 mg, 4 qhs. Continue perphenazine 16 mg, 1 po bid. Continue Lyrica 100 mg, 1 p.o. twice daily. Continue trazodone 100 mg, 3 p.o. nightly as needed sleep. Return in 4 weeks.  Donnal Moat, PA-C

## 2022-03-23 ENCOUNTER — Telehealth: Payer: Self-pay | Admitting: Physician Assistant

## 2022-03-23 NOTE — Telephone Encounter (Signed)
Next visit is 04/20/22. Requesting a refill on Clonazepam called to:  [id number] [place] [place] - [address]  Phone:[phone]:[phone]

## 2022-03-23 NOTE — Telephone Encounter (Deleted)
You sent new Rx for fill 11/1 for clonazepam TID (max 2.5). Patient's previous Rx was for BID, but he was taking it TID due to family issues. He last filled 10/20. Pharmacy said it is too early to fill 11/1 Rx, even though dosing is different.

## 2022-03-25 NOTE — Telephone Encounter (Signed)
Call and ok the early RF if needed, b/c of increase in frequency he can take. Thanks

## 2022-03-26 ENCOUNTER — Other Ambulatory Visit (HOSPITAL_COMMUNITY): Payer: Self-pay

## 2022-03-26 ENCOUNTER — Telehealth: Payer: Self-pay | Admitting: Pharmacy Technician

## 2022-03-26 NOTE — Telephone Encounter (Signed)
Pharmacy Patient Advocate Encounter   Received notification from CoverMyMeds that prior authorization for Dexcom G7 is required/requested.  Per Test Claim: Not covered under their Part D. Send to [facility] [phone] or [facility] [phone]

## 2022-03-27 ENCOUNTER — Telehealth: Payer: Self-pay

## 2022-03-27 NOTE — Telephone Encounter (Signed)
Order has been placed through [employer]

## 2022-03-27 NOTE — Telephone Encounter (Signed)
Prior Authorization submitted for AUSTEDO XR 24 MG approval received effective 05/21/2021-05/21/2023, PA# [id number] with [facility]

## 2022-04-10 DIAGNOSIS — I10 Essential (primary) hypertension: Secondary | ICD-10-CM | POA: Diagnosis not present

## 2022-04-10 DIAGNOSIS — W010XXA Fall on same level from slipping, tripping and stumbling without subsequent striking against object, initial encounter: Secondary | ICD-10-CM | POA: Diagnosis not present

## 2022-04-10 DIAGNOSIS — M545 Low back pain, unspecified: Secondary | ICD-10-CM | POA: Diagnosis not present

## 2022-04-10 DIAGNOSIS — M533 Sacrococcygeal disorders, not elsewhere classified: Secondary | ICD-10-CM | POA: Diagnosis not present

## 2022-04-11 ENCOUNTER — Other Ambulatory Visit (HOSPITAL_COMMUNITY): Payer: Self-pay

## 2022-04-19 ENCOUNTER — Other Ambulatory Visit: Payer: Self-pay | Admitting: Physician Assistant

## 2022-04-19 NOTE — Telephone Encounter (Signed)
Has an appt with Helene Kelp tomorrow.

## 2022-04-20 ENCOUNTER — Ambulatory Visit (INDEPENDENT_AMBULATORY_CARE_PROVIDER_SITE_OTHER): Payer: Medicare HMO | Admitting: Physician Assistant

## 2022-04-20 ENCOUNTER — Encounter: Payer: Self-pay | Admitting: Physician Assistant

## 2022-04-20 DIAGNOSIS — F902 Attention-deficit hyperactivity disorder, combined type: Secondary | ICD-10-CM

## 2022-04-20 DIAGNOSIS — E032 Hypothyroidism due to medicaments and other exogenous substances: Secondary | ICD-10-CM | POA: Diagnosis not present

## 2022-04-20 DIAGNOSIS — F5105 Insomnia due to other mental disorder: Secondary | ICD-10-CM

## 2022-04-20 DIAGNOSIS — F250 Schizoaffective disorder, bipolar type: Secondary | ICD-10-CM | POA: Diagnosis not present

## 2022-04-20 DIAGNOSIS — G2401 Drug induced subacute dyskinesia: Secondary | ICD-10-CM | POA: Diagnosis not present

## 2022-04-20 DIAGNOSIS — Z79899 Other long term (current) drug therapy: Secondary | ICD-10-CM | POA: Diagnosis not present

## 2022-04-20 DIAGNOSIS — F99 Mental disorder, not otherwise specified: Secondary | ICD-10-CM | POA: Diagnosis not present

## 2022-04-20 DIAGNOSIS — F411 Generalized anxiety disorder: Secondary | ICD-10-CM

## 2022-04-20 DIAGNOSIS — F17200 Nicotine dependence, unspecified, uncomplicated: Secondary | ICD-10-CM | POA: Diagnosis not present

## 2022-04-20 MED ORDER — PREGABALIN 100 MG PO CAPS: 100.0000 mg | ORAL_CAPSULE | Freq: Two times a day (BID) | ORAL | 1 refills | Status: DC

## 2022-04-20 MED ORDER — AUSTEDO XR 6 MG PO TB24: 6.0000 mg | ORAL_TABLET | Freq: Every day | ORAL | 11 refills | Status: DC

## 2022-04-20 MED ORDER — AUSTEDO XR 24 MG PO TB24: 24.0000 mg | ORAL_TABLET | Freq: Every day | ORAL | 11 refills | Status: DC

## 2022-04-20 MED ORDER — ALPRAZOLAM 1 MG PO TABS: 1.0000 mg | ORAL_TABLET | Freq: Three times a day (TID) | ORAL | 0 refills | Status: DC | PRN

## 2022-04-20 NOTE — Progress Notes (Signed)
[facility]  Patient ID: Matthew [name],  MRN: [id number]  PCP: [name], MD  Date of Evaluation: 04/20/2022  time spent:30 minutes  Chief Complaint:  Chief Complaint   Anxiety; Depression; Insomnia; Follow-up    HISTORY/CURRENT STATUS Not doing well.  More anxious, worse the past few weeks, partly d/t holidays, stressful with helping care for his mom, he and his dad are her sole caregivers, she has sundowners and he doesn't get much rest and never more than 3 hours straight.  Ask if there is something else that he can take instead of the Klonopin.  Even with the increased dose at the last visit he has still felt more anxious.  Not having panic attacks but more overwhelmed.  Austedo XR has helped the mouth movements a lot. Wonders if increasing the dose would be more helpful though. Has side to side motion of lower jaw. Not biting tongue or buccal mucosa, no tremor or other movement issues.   Doesn't really have time to enjoy things.  Energy and motivation are fair to good, depending on the day.  No extreme sadness, tearfulness, or feelings of hopelessness.  ADLs and personal hygiene are normal.  Appetite has not changed.  Weight is stable.  Denies suicidal or homicidal thoughts.  Patient denies increased energy with decreased need for sleep, increased talkativeness, racing thoughts, impulsivity or risky behaviors, increased spending, increased libido, grandiosity, increased irritability or anger, paranoia, or hallucinations.  Review of Systems  Constitutional:  Positive for malaise/fatigue.       From not sleeping enough  HENT: Negative.    Eyes: Negative.   Respiratory: Negative.    Cardiovascular: Negative.   Gastrointestinal: Negative.   Genitourinary: Negative.   Musculoskeletal: Negative.   Skin: Negative.   Neurological: Negative.   Endo/Heme/Allergies: Negative.   Psychiatric/Behavioral:         See HPI   Individual Medical History/ Review of  Systems: Changes? :Yes   bruised his tailbone after tripping and falling recently. Tripped on curb.      Past medications for mental health diagnoses include: Risperdal, Zyprexa, Abilify, [name], Klonopin, Gabapentin, Xanax, Valium, Ativan, Hydroxyzine, Trilafon, Atenolol, Cymbalta, Prozac, Lexapro, Paxil, Lithium, Adderall, Ritalin, Mirtazepine, Sonata was ineffective.  Allergies: Gluten meal and Lactose intolerance (gi)  Current Medications:  Current Outpatient Medications:    ALPRAZolam (XANAX) 1 MG tablet, Take 1 tablet (1 mg total) by mouth 3 (three) times daily as needed for anxiety., Disp: 90 tablet, Rfl: 0   aspirin EC 81 MG tablet, Take 1 tablet (81 mg total) by mouth daily. Swallow whole., Disp: 90 tablet, Rfl: 3   atomoxetine (STRATTERA) 60 MG capsule, Take 1 capsule (60 mg total) by mouth daily., Disp: 30 capsule, Rfl: 1   atorvastatin (LIPITOR) 80 MG tablet, Take 1 tablet (80 mg total) by mouth every morning., Disp: 90 tablet, Rfl: 3   Boswellia-Glucosamine-Vit D (OSTEO BI-FLEX ONE PER DAY) TABS, Take 1 tablet by mouth daily., Disp: , Rfl:    carvedilol (COREG) 25 MG tablet, Take 1.5 tablets (37.5 mg total) by mouth 2 (two) times daily., Disp: 270 tablet, Rfl: 3   clopidogrel (PLAVIX) 75 MG tablet, Take 1 tablet (75 mg total) by mouth daily., Disp: 90 tablet, Rfl: 3   Coenzyme Q10 (CO Q-10 PO), Take 1 capsule by mouth daily., Disp: , Rfl:    Continuous Blood Gluc Sensor (DEXCOM G7 SENSOR) MISC, 1 Device by Does not apply route as directed., Disp: 9 each, Rfl: 3  Deutetrabenazine ER (AUSTEDO XR) 6 MG TB24, Take 6 mg by mouth daily., Disp: 30 tablet, Rfl: 11   DULoxetine (CYMBALTA) 30 MG capsule, Take 1 capsule (30 mg total) by mouth daily. Take 1 with the 60 mg=90 mg daily, Disp: 90 capsule, Rfl: 0   DULoxetine (CYMBALTA) 60 MG capsule, Take 1 capsule (60 mg total) by mouth daily. Take with 30 mg=90 mg daily., Disp: 90 capsule, Rfl: 0   empagliflozin (JARDIANCE) 25 MG TABS tablet,  Take 1 tablet (25 mg total) by mouth daily before breakfast., Disp: 90 tablet, Rfl: 3   fenofibrate (TRICOR) 145 MG tablet, Take 1 tablet (145 mg total) by mouth daily., Disp: 90 tablet, Rfl: 3   icosapent Ethyl (VASCEPA) 1 g capsule, Take 2 capsules (2 g total) by mouth 2 (two) times daily., Disp: 360 capsule, Rfl: 3   insulin glargine (LANTUS SOLOSTAR) 100 UNIT/ML Solostar Pen, Inject 25 Units into the skin daily., Disp: 30 mL, Rfl: 3   Insulin Pen Needle 32G X 4 MM MISC, 1 Device by Does not apply route daily., Disp: 150 each, Rfl: 3   levothyroxine (SYNTHROID) 25 MCG tablet, TAKE 1 TABLET IN THE MORNING WITH A GLASS OF WATER, WAIT 1 HOUR BEFORE TAKING OTHER MEDS OR EATING, Disp: 90 tablet, Rfl: 0   lithium carbonate (LITHOBID) 300 MG CR tablet, TAKE 2 TABLETS BY MOUTH TWICE A DAY (Patient taking differently: Take 300 mg by mouth 2 (two) times daily. 3 q am, 2 qhs), Disp: 360 tablet, Rfl: 0   metFORMIN (GLUCOPHAGE) 1000 MG tablet, Take 1 tablet (1,000 mg total) by mouth 2 (two) times daily with a meal., Disp: 180 tablet, Rfl: 3   perphenazine (TRILAFON) 16 MG tablet, TAKE 1 TABLET EVERY MORNING AND 2 TABLETS AT BEDTIME, Disp: 270 tablet, Rfl: 0   Pseudoephedrine HCl (SUDAFED 12 HOUR PO), Take 1 tablet by mouth daily as needed (congestion)., Disp: , Rfl:    traZODone (DESYREL) 100 MG tablet, Take 3 tablets (300 mg total) by mouth at bedtime., Disp: 270 tablet, Rfl: 0   TURMERIC CURCUMIN PO, Take 1 capsule by mouth daily., Disp: , Rfl:    Deutetrabenazine ER (AUSTEDO XR) 24 MG TB24, Take 24 mg by mouth daily., Disp: 30 tablet, Rfl: 11   nitroGLYCERIN (NITROSTAT) 0.4 MG SL tablet, Place 1 tablet (0.4 mg total) under the tongue every 5 (five) minutes as needed for chest pain., Disp: 100 tablet, Rfl: 3   pregabalin (LYRICA) 100 MG capsule, Take 1 capsule (100 mg total) by mouth 2 (two) times daily., Disp: 60 capsule, Rfl: 1   spironolactone (ALDACTONE) 25 MG tablet, Take 0.5 tablets (12.5 mg total) by  mouth daily. (Patient not taking: Reported on 10/13/2021), Disp: 45 tablet, Rfl: 3 Medication Side Effects: none  Family Medical/ Social History: Changes? no  MENTAL HEALTH EXAM:  There were no vitals taken for this visit.There is no height or weight on file to calculate BMI.  General Appearance: Casual, Well Groomed, and Obese  Eye Contact:  Good  Speech:  Clear and Coherent and Normal Rate  Volume:  Normal  Mood:  Anxious  Affect:  Congruent  Thought Process:  Goal Directed and Descriptions of Associations: Circumstantial  Orientation:  Full (Time, Place, and Person)  Thought Content: Logical   Suicidal Thoughts:  No  Homicidal Thoughts:  No  Memory:  WNL  Judgement:  Fair  Insight:  Fair  Psychomotor Activity: Occas mandibular side to side motion  Concentration:  Concentration: Good and  Attention Span: Fair  Recall:  Good  Fund of Knowledge: Good  Language: NA  Assets:  Desire for Improvement  ADL's:  Intact  Cognition: WNL  Prognosis:  [place] Office Visit from 04/20/2022 in [place] Office Visit from 03/21/2022 in [facility] [place] Total Score 7 17      PHQ2-9    Flowsheet Row CARDIAC REHAB PHASE II ORIENTATION from 08/16/2020 in [facility]  PHQ-2 Total Score 0      Flowsheet Row Admission (Discharged) from 11/28/2021 in [facility] from 11/17/2021 in [facility] from 07/04/2021 in [facility] No Risk No Risk No Risk      DIAGNOSES:    ICD-10-CM   1. Schizoaffective disorder, bipolar type (HCC)  T90.3 Basic metabolic panel    Lithium level    2. Tardive dyskinesia  G24.01     3. Generalized anxiety disorder  F41.1     4. Insomnia due to other mental disorder  F51.05    F99     5. Hypothyroidism  due to medication  E03.2 TSH    6. Attention deficit hyperactivity disorder (ADHD), combined type, moderate  F90.2     7. Smoker  F17.200     8. Encounter for long-term (current) use of medications  Z79.899 TSH    Basic metabolic panel    Lithium level     Receiving Psychotherapy: No   RECOMMENDATIONS:  PDMP reviewed.  Klonopin filled 03/31/2022.  Also on Lyrica and hydrocodone known to me. I provided 30 minutes of face to face time during this encounter, including time spent before and after the visit in records review, medical decision making, counseling pertinent to today's visit, and charting.   Disc anxiety, ok to change Klonopin to Xanax. Ok to try that, unable to get until finishes Klonopin. Pt understands Xanax is usually quicker acting but isn't as long lasting. Sedation, tolerance, addiction, as w/ all BZ, he understands and accepts.   D/C Klonopin. Start Xanax 1 mg, 1 po tid prn anxiety.  Continue Strattera 60 mg, 1 p.o. daily. Increase Austedo XR to 30 mg daily. Continue Cymbalta 90 mg daily.  Continue levothyroxine 25 mg every morning. Continue lithium CR 300 mg, 3 po q am, 2 qhs. Continue perphenazine 16 mg, 1 po q am, 2 qhs. Continue Lyrica 100 mg, 1 p.o. twice daily. Continue trazodone 100 mg, 3 p.o. nightly as needed sleep. Labs ordered as above.  Return in 4 weeks.  Donnal Moat, PA-C

## 2022-04-26 ENCOUNTER — Other Ambulatory Visit: Payer: Self-pay

## 2022-04-26 ENCOUNTER — Telehealth: Payer: Self-pay | Admitting: Physician Assistant

## 2022-04-26 DIAGNOSIS — M5416 Radiculopathy, lumbar region: Secondary | ICD-10-CM | POA: Diagnosis not present

## 2022-04-26 DIAGNOSIS — M47816 Spondylosis without myelopathy or radiculopathy, lumbar region: Secondary | ICD-10-CM | POA: Diagnosis not present

## 2022-04-26 MED ORDER — PREGABALIN 100 MG PO CAPS: 100.0000 mg | ORAL_CAPSULE | Freq: Two times a day (BID) | ORAL | 1 refills | Status: DC

## 2022-04-26 NOTE — Telephone Encounter (Signed)
Pended.

## 2022-04-26 NOTE — Telephone Encounter (Signed)
Pt called at 9:39a.  He called for Lyrica refill.  Looks like [place] [facility] sent it to [place] and he wants it sent to [facility] [address].  Next appt 1/10

## 2022-05-01 ENCOUNTER — Other Ambulatory Visit: Payer: Self-pay | Admitting: Physician Assistant

## 2022-05-01 DIAGNOSIS — E032 Hypothyroidism due to medicaments and other exogenous substances: Secondary | ICD-10-CM | POA: Diagnosis not present

## 2022-05-01 DIAGNOSIS — Z79899 Other long term (current) drug therapy: Secondary | ICD-10-CM | POA: Diagnosis not present

## 2022-05-01 DIAGNOSIS — F250 Schizoaffective disorder, bipolar type: Secondary | ICD-10-CM | POA: Diagnosis not present

## 2022-05-02 ENCOUNTER — Telehealth: Payer: Self-pay | Admitting: Physician Assistant

## 2022-05-02 ENCOUNTER — Encounter: Payer: Self-pay | Admitting: Physician Assistant

## 2022-05-02 NOTE — Telephone Encounter (Signed)
It's not contraindicated with CAD s/p CBAG. He has no QT prolongation so it's ok to take Austedo. Please let pharmacy know. Thanks.

## 2022-05-02 NOTE — Telephone Encounter (Signed)
Please advise 

## 2022-05-02 NOTE — Telephone Encounter (Signed)
Next visit is 05/30/22. [facility] called regarding patient's Austedo XR 24 mg. Pt told them he has arthrosclerosis  and coronary bypass graft. The pharmacy wants to know if he should be taking the Austedo with these conditions?   Pharmacy:   [id number] [place], [place] - [address]  Phone: [phone] Fax: [phone]

## 2022-05-03 DIAGNOSIS — E1165 Type 2 diabetes mellitus with hyperglycemia: Secondary | ICD-10-CM | POA: Diagnosis not present

## 2022-05-03 LAB — LITHIUM LEVEL: Lithium Lvl: 1 mmol/L (ref 0.5–1.2)

## 2022-05-03 LAB — BASIC METABOLIC PANEL
BUN/Creatinine Ratio: 12 (ref 9–20)
BUN: 16 mg/dL (ref 6–24)
CO2: 17 mmol/L — ABNORMAL LOW (ref 20–29)
Calcium: 10.3 mg/dL — ABNORMAL HIGH (ref 8.7–10.2)
Chloride: 98 mmol/L (ref 96–106)
Creatinine, Ser: 1.31 mg/dL — ABNORMAL HIGH (ref 0.76–1.27)
Glucose: 223 mg/dL — ABNORMAL HIGH (ref 70–99)
Potassium: 4.4 mmol/L (ref 3.5–5.2)
Sodium: 136 mmol/L (ref 134–144)
eGFR: 69 mL/min/{1.73_m2} (ref >59)

## 2022-05-03 LAB — TSH: TSH: 1.96 u[IU]/mL (ref 0.450–4.500)

## 2022-05-03 NOTE — Progress Notes (Deleted)
Cardiology Office Note:    Date:  05/03/2022   ID:  Matthew [name], DOB 15-May-1976, MRN [id number]  PCP:  [name], MD   [place]  Cardiologist:  Freada Bergeron, MD  Advanced Practice Provider:  No care team member to display Electrophysiologist:  None   Referring MD: [name], MD    History of Present Illness:    Matthew [name] is a 46 y.o. male with a hx of HTN, HLD, pre-DM, celiac dz, schizoaffective d/o, anxiety, ADHD, OSA not on CPAP, and recent NSTEMI s/p DES to pLAD and DES to dRCA who presents to clinic for follow-up.   Patient admitted with NSTEMI 06/06/20 treated with DES pLAD and DES dRCA, and severe LVEF 25-30%. Patient left AMA 06/08/19 and was seen urgently in clinic by Ermalinda Barrios. During that visit, he was more SOB and had been eating a lot of fast food and had not filled his scripts for atenolol or lisinopril. Since that time, he has filled his prescriptions and has been taking all medications as prescribed. He understands the importance of taking his medications and that stopping his ASA or brilinta could lead to a massive MI and/or death. He states that since his hospitalization, he has been feeling overall okay.  Occasionally has some palpitations with some short of breath and mild lightheadedness. Episodes occur about every other day and last a couple of minutes before resolving. Can happen at rest or with activity. No chest pain.   During visit on 06/22/20, the patient had been having palpitations. He was referred for cardiac monitor and cardiac rehab. Cardiac monitor showed: rare ectopy with no significant arrhythmias or ectopy.  During visit on 07/20/20 he was having intermittent chest tightness. He has a subsequent ER visit on 08/10/20 for abdominal pain. Work-up reassuring and he was discharged in imdur 60m daily as well as nitro.  During visit on 08/31/20, he was very tachycardic usually related to heavy smoking. Was not  taking his imdur, nitro, jardiance or insulin. He was otherwise doing well from a CV standpoint.  During his last appointment on 10/10/20, he continued to do well from a CV standpoint but complained of lower back pain. He was down to smoking 1 ppd and improving medication compliance including his insulin.   Was admitted on 10/07-10/08/22 for chest pain. Cath showed patent LAD stent, 25% Lcx, 25-45% prox RCA disease, anterior hypokinesis with EF 50%, LVEDP 784mg. Recommended for continued medical management.  Readmitted 04/23/21 for chest pain and syncope. ECG nonischemic. Trop negative. TTE with LVEF 60-65%, normal RV, no significant valve disease. Symptoms thought to be vagal in nature as he fainted with coughing. Discharged home.  He saw EmDiona BrownerNP 05/31/21 after ED visit for syncope. He reported his episodes occurred while in the bathroom or coughing. He also reported sharp mid-lower chest pain that could last for hours up to days that was not improved with nitroglycerin. He was back to smoking 2 to 3 ppd. Lexiscan Myoview was reordered but not performed.   Was seen in clinic on 07/2021 where he was doing well. Stopped lisinopril and fainting episodes and cough resolved. Continued to smoke 2ppd. Has been started on fenofibrate for persistently elevated TG.  Was last seen in clinic 10/2021 where he had quit smoking. Was doing very well from a CV standpoint.  Today, ***    Past Medical History:  Diagnosis Date   Anxiety    Arthritis    Celiac  disease    Coronary artery disease    Dairy allergy    Depression    Diabetes mellitus without complication ([place])    High cholesterol    Hypertension    Hypothyroidism    Myocardial infarction ([place])    Paranoia ([place])    Schizoaffective disorder ([place])     Past Surgical History:  Procedure Laterality Date   APPENDECTOMY     CARDIAC CATHETERIZATION     CORONARY STENT INTERVENTION N/A 06/06/2020   Procedure: CORONARY STENT INTERVENTION;   Surgeon: Martinique, Peter M, MD;  Location: [facility];  Service: Cardiovascular;  Laterality: N/A;  prox LAD, distal RCA   INTRAVASCULAR ULTRASOUND/IVUS N/A 06/06/2020   Procedure: Intravascular Ultrasound/IVUS;  Surgeon: Martinique, Peter M, MD;  Location: [facility];  Service: Cardiovascular;  Laterality: N/A;   LEFT HEART CATH AND CORONARY ANGIOGRAPHY N/A 06/06/2020   Procedure: LEFT HEART CATH AND CORONARY ANGIOGRAPHY;  Surgeon: Martinique, Peter M, MD;  Location: [facility];  Service: Cardiovascular;  Laterality: N/A;   LEFT HEART CATH AND CORONARY ANGIOGRAPHY N/A 02/24/2021   Procedure: LEFT HEART CATH AND CORONARY ANGIOGRAPHY;  Surgeon: Belva Crome, MD;  Location: [facility];  Service: Cardiovascular;  Laterality: N/A;   NASAL SEPTUM SURGERY     TOTAL SHOULDER ARTHROPLASTY Right 11/28/2021   Procedure: TOTAL SHOULDER ARTHROPLASTY;  Surgeon: Marchia Bond, MD;  Location: [facility];  Service: Orthopedics;  Laterality: Right;   WRIST FRACTURE SURGERY     right wrist    Current Medications: No outpatient medications have been marked as taking for the 05/15/22 encounter (Appointment) with Freada Bergeron, MD.     Allergies:   Gluten meal and Lactose intolerance (gi)   Social History   Socioeconomic History   Marital status: Single    Spouse name: Not on file   Number of children: 0   Years of education: 14   Highest education level: Associate degree: occupational, Hotel manager, or vocational program  Occupational History   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, multimedia, unemployed  Tobacco Use   Smoking status: Some Days    Packs/day: 1.00    Types: Cigarettes, E-cigarettes    Last attempt to quit: 10/19/2021    Years since quitting: 0.5   Smokeless tobacco: Never  Vaping Use   Vaping Use: Former  Substance and Sexual Activity   Alcohol use: No    Alcohol/week: 0.0 standard drinks of alcohol    Comment: no (02/08/15)   Drug use: No   Sexual activity: Not on file  Other Topics  Concern   Not on file  Social History Narrative   Pt lives at the [facility] Determinants of Health   Financial Resource Strain: Not on file  Food Insecurity: Not on file  Transportation Needs: Not on file  Physical Activity: Not on file  Stress: Not on file  Social Connections: Not on file     Family History: The patient's family history includes Asthma in his mother; Hypertension in his father and mother.  ROS:   Please see the history of present illness.    Review of Systems  Constitutional:  Negative for chills, fever and malaise/fatigue.  HENT:  Negative for congestion and hearing loss.   Eyes:  Negative for blurred vision and redness.  Respiratory:  Negative for cough and shortness of breath.   Cardiovascular:  Positive for palpitations. Negative for chest pain, orthopnea, claudication, leg swelling and PND.  Gastrointestinal:  Negative for  constipation, nausea and vomiting.  Genitourinary:  Negative for dysuria and flank pain.  Musculoskeletal:  Negative for back pain and falls.       Positive for bilateral hand swelling  Neurological:  Positive for sensory change. Negative for dizziness, loss of consciousness and headaches.  Endo/Heme/Allergies:  Negative for polydipsia.  Psychiatric/Behavioral:  Negative for substance abuse. Hallucinations: Auditory.    EKGs/Labs/Other Studies Reviewed:    The following studies were reviewed today: Carotid Duplex 06/14/21 Summary:  Right Carotid: The extracranial vessels were near-normal with only minimal wall thickening or plaque.   Left Carotid: The extracranial vessels were near-normal with only minimal wall thickening or plaque.   Vertebrals:  Bilateral vertebral arteries demonstrate antegrade flow.   Subclavians: Normal flow hemodynamics were seen in bilateral subclavian arteries.  Monitor 05/19/21 Patch wear time was 11 days and 17 hours Predominant rhythm was NSR/sinus tachycardia with average HR 101bpm  (ranging 56-141) Rare ectopy (<1% SVE; <1% VE) No sustained arrhythmias or significant pauses.     Patch Wear Time:  11 days and 17 hours (2022-12-12T19:25:03-499 to 2022-12-24T12:40:12-498)   Patient had a min HR of 56 bpm, max HR of 140 bpm, and avg HR of 101 bpm. Predominant underlying rhythm was Sinus Rhythm. Isolated SVEs were rare (<1.0%), and no SVE Couplets or SVE Triplets were present. Isolated VEs were rare (<1.0%, 9085), VE Couplets  were rare (<1.0%, 31), and VE Triplets were rare (<1.0%, 1). Ventricular Bigeminy and Trigeminy were present.   Echo 04/24/21 1. Left ventricular ejection fraction, by estimation, is 60 to 65%. The left ventricle has normal function. The left ventricle has no regional wall motion abnormalities. There is mild left ventricular hypertrophy. Left ventricular diastolic parameters were normal.   2. Right ventricular systolic function is normal. The right ventricular size is normal. Tricuspid regurgitation signal is inadequate for assessing PA pressure.   3. The mitral valve is normal in structure. No evidence of mitral valve regurgitation.   4. The aortic valve was not well visualized. Aortic valve regurgitation is not visualized. No aortic stenosis is present.   L Heart Cath 02/24/21 CONCLUSIONS: Normal left main Patent LAD stent with minimal luminal irregularities otherwise. 25% mid circumflex. Dominant right coronary with patent distal RCA stent.  Proximal tandem 25 and 45% stenoses.  Mild to moderate diffuse atherosclerosis noted in the right coronary. Anterior hypokinesis with EF 50%.  LVEDP 7 mmHg.   RECOMMENDATIONS:   Aggressive risk factor modification including smoking cessation, glycemic control, blood pressure control, and lipid management of LDL to less than 70.  Echo 08/12/20 1. Left ventricular ejection fraction, by estimation, is 65 to 70%. The left ventricle has normal function. The left ventricle has no regional wall motion  abnormalities. There is mild left ventricular hypertrophy. Left ventricular diastolic parameters were normal.   2. Right ventricular systolic function is normal. The right ventricular size is normal.   3. The mitral valve is normal in structure. No evidence of mitral valve regurgitation. No evidence of mitral stenosis.   4. The aortic valve is normal in structure. Aortic valve regurgitation is not visualized. No aortic stenosis is present.   5. The inferior vena cava is normal in size with greater than 50% respiratory variability, suggesting right atrial pressure of 3 mmHg.   Cardiac monitor 07/19/20: Patch Wear Time:  7 days and 17 hours (2022-02-12T16:36:22-0500 to 2022-02-20T10:06:03-499)   Patient had a min HR of 55 bpm, max HR of 135 bpm, and avg HR of 88 bpm.  Predominant underlying rhythm was Sinus Rhythm. No Isolated SVEs, SVE Couplets, or SVE Triplets were present. Isolated VEs were rare (<1.0%), VE Couplets were rare (<1.0%), and no  VE Triplets were present.   Left Heart Cath 06/06/2020:   1. Severe 2 vessel obstructive CAD    - 99% proximal LAD    - 90% distal RCA 2. Severe LV dysfunction. EF estimated at 25-30%. Segmental wall motion abnormalities. 3. Moderately elevated LVEDP 4. Successful PCI of the proximal LAD with IVUS guidance and DES x 1 5. Successful PCI of the distal RCA with IVUS guidance and DES x 1  EKG:  08/09/21: Sinus tachycardia, HR 108; anterior infarct 10/10/20: Sinus tachycardia, rate: 109, poor r-wave progression, inferior Q waves, no changes from his previous visit    Recent Labs: 11/17/2021: ALT 23; Hemoglobin 16.1; Platelets 271 05/01/2022: BUN 16; Creatinine, Ser 1.31; Potassium 4.4; Sodium 136; TSH 1.960  Recent Lipid Panel    Component Value Date/Time   CHOL 115 01/29/2022 0809   TRIG 204 (H) 01/29/2022 0809   HDL 27 (L) 01/29/2022 0809   CHOLHDL 4.3 01/29/2022 0809   CHOLHDL 7.2 02/25/2021 0139   VLDL UNABLE TO CALCULATE IF TRIGLYCERIDE OVER 400  mg/dL 02/25/2021 0139   LDLCALC 54 01/29/2022 0809   LDLDIRECT 42.4 02/25/2021 0139     Physical Exam:     VS:  There were no vitals taken for this visit.    Wt Readings from Last 3 Encounters:  03/19/22 275 lb (124.7 kg)  11/28/21 258 lb (117 kg)  11/17/21 258 lb (117 kg)     GEN:  Well nourished, well developed in no acute distress HEENT: Normal NECK: No JVD; No carotid bruits CARDIAC: RRR, no murmurs RESPIRATORY:  Clear to auscultation without rales, wheezing or rhonchi  ABDOMEN: Soft, non-tender, non-distended MUSCULOSKELETAL:  No edema; No deformity  SKIN: Warm and dry NEUROLOGIC:  Alert and oriented x 3 PSYCHIATRIC:  Normal affect   ASSESSMENT:    No diagnosis found.    PLAN:    In order of problems listed above:  #NSTEMI: #Multivessel CAD: Patient presented to [facility] on 06/06/20 with significant chest pain found to have elevated troponin c/w NSTEMI. Underwent coronary angiography which revealed 99% proximal LAD and 90% distal RCA both of which were treated successfuly with DES. LV gram with LVEF 25-30%. Left AMA after that hospitalization. Repeat TTEs (latest 04/2021) with recovered EF 60-65%. Has had numerous subsequent ER visits for chest pain. Repeat cath in 02/2021 with stable disease, patent LAD stent and RCA stent. Recommended for continued medical management. -TTE with recovered EF 60-65%, no significant WMA -Continue ASA 78m daily; okay to hold for shoulder surgery -Continue plavix 783mdaily -Continue coreg 2585mID -Continue lipitor 65m14mily -Continue spironolactone 12.5mg 69mly -Continue jardiance 10mg 31my -Off lisinopril for cough -Quit smoking -Continue lifestyle modifications -Better medication compliance   #Ischemic Cardiomyopathy: #Chronic Systolic heart faillure with improved EF EF approximately 25-30% on LV gram-->improved to 65-70% on TTE on 08/12/20. Euvolemic on examination. Currently with NYHA class II symptoms.  -TTE with recovered  EF 60-65% -Off lisinopril for cough -Continue coreg 25mg B46ms above -Continue spironolactone 12.5mg dai52mand jardiance 10mg dai55ms above -Low Na diet  #Mixed HLD: #Hypertriglyceridemia: Followed by lipid clinic. -Continue lipitor 65mg dail37mascepa 2gBID, fenofibrate 145mg daily33mL 54, TG 204  #Syncope: Resolved after stopping lisinopril for cough. No further episodes. -Resolved after stopping lisinopril   #Palpitations: Improved. Likely related to nicotine patch and  levothyroxine. Cardiac monitor without significant ectopy. Rare PVCs.  -Continue coreg 37m BID   #DMII: A1C improving 12.8>8.1. -Follow-up with Endocrinology as scheduled -Continue metformin 1004mdaily -Continue jardiance 2566maily -Continue insulin  #Fatty Liver Disease: -Repeat hepatic panel -Continue management of HLD and DMII as above   #OSA not on CPAP: -Follow-up with Pulm   #Tobacco abuse> Successfully QUIT: -Congratulated him on quitting!! -Continue nicotine patches   #Schoizoaffective disorder: #Depression: -Management per psych  #Low Back Pain: Follows with NSGY.  Medication Adjustments/Labs and Tests Ordered: Current medicines are reviewed at length with the patient today.  Concerns regarding medicines are outlined above.  No orders of the defined types were placed in this encounter.  No orders of the defined types were placed in this encounter.   There are no Patient Instructions on file for this visit.    Signed, HeaFreada BergeronD  05/03/2022 9:38 AM    [place]

## 2022-05-03 NOTE — Telephone Encounter (Signed)
Pharmacy informed.

## 2022-05-04 NOTE — Progress Notes (Signed)
Please let him know blood sugar is 223.  Make sure he is following up with his PCP for that.  Also the creatinine is slightly elevated.  I am not really concerned about it but because he is on lithium we need to watch closely.  Will repeat BMP in 6 weeks or so.  He and I can talk about that at our next visit. Thyroid test is within a good range, continue the same dose of levothyroxine. Lithium result is good, continue the same dose. Thank you

## 2022-05-04 NOTE — Telephone Encounter (Signed)
Pharmacy LVM again today @ 10:48 am. [facility]. CALL [phone]

## 2022-05-04 NOTE — Telephone Encounter (Signed)
Spoke to [employer]

## 2022-05-07 ENCOUNTER — Other Ambulatory Visit (HOSPITAL_COMMUNITY): Payer: Self-pay

## 2022-05-07 DIAGNOSIS — M47816 Spondylosis without myelopathy or radiculopathy, lumbar region: Secondary | ICD-10-CM | POA: Diagnosis not present

## 2022-05-10 ENCOUNTER — Other Ambulatory Visit (HOSPITAL_COMMUNITY): Payer: Self-pay

## 2022-05-10 ENCOUNTER — Other Ambulatory Visit: Payer: Self-pay | Admitting: Physician Assistant

## 2022-05-10 ENCOUNTER — Telehealth: Payer: Self-pay

## 2022-05-10 NOTE — Telephone Encounter (Signed)
Pharmacy Patient Advocate Encounter   Received notification from MEDICAID that prior authorization for [place] is needed.    PA submitted on 05/10/22 VIA [employer] [id number] W Status is pending  Karie Soda, [facility] Patient Advocate Specialist Direct Number: [phone] Fax: [phone]

## 2022-05-15 ENCOUNTER — Other Ambulatory Visit (HOSPITAL_COMMUNITY): Payer: Self-pay

## 2022-05-15 ENCOUNTER — Ambulatory Visit: Payer: Medicare HMO | Admitting: Cardiology

## 2022-05-15 NOTE — Telephone Encounter (Signed)
Pharmacy Patient Advocate Encounter  Received notification from MEDICAID   that the request for prior authorization for [place]  has been denied due to PATIENT DID NOT MEET CLINICAL REQUIREMENTS (INJECTING INSULIN MULTIPLE TIMES PER DAY).       PLEASE ADVISE  Karie Soda, CPhT Pharmacy Patient Advocate Specialist Direct Number: [phone] Fax: [phone]

## 2022-05-17 NOTE — Telephone Encounter (Signed)
Due 12/30.

## 2022-05-18 ENCOUNTER — Other Ambulatory Visit: Payer: Self-pay | Admitting: Physician Assistant

## 2022-05-18 NOTE — Telephone Encounter (Signed)
Pt called to make sure the xanax is sent in today

## 2022-05-22 ENCOUNTER — Telehealth: Payer: Self-pay | Admitting: Physician Assistant

## 2022-05-22 MED ORDER — ALPRAZOLAM 1 MG PO TABS: 1.0000 mg | ORAL_TABLET | Freq: Three times a day (TID) | ORAL | 0 refills | Status: DC | PRN

## 2022-05-22 NOTE — Telephone Encounter (Signed)
Pt LVM 12/30 9:30 am requesting new Rx for Alprazolam  send to [facility]. [facility] out of stock

## 2022-05-22 NOTE — Telephone Encounter (Signed)
Rx was sent  

## 2022-05-22 NOTE — Telephone Encounter (Signed)
Filled 12/30 for only #30 even though #90 was prescribed

## 2022-05-30 ENCOUNTER — Encounter: Payer: Self-pay | Admitting: Physician Assistant

## 2022-05-30 ENCOUNTER — Ambulatory Visit (INDEPENDENT_AMBULATORY_CARE_PROVIDER_SITE_OTHER): Payer: Medicare Other | Admitting: Physician Assistant

## 2022-05-30 DIAGNOSIS — F411 Generalized anxiety disorder: Secondary | ICD-10-CM | POA: Diagnosis not present

## 2022-05-30 DIAGNOSIS — F5105 Insomnia due to other mental disorder: Secondary | ICD-10-CM | POA: Diagnosis not present

## 2022-05-30 DIAGNOSIS — F3341 Major depressive disorder, recurrent, in partial remission: Secondary | ICD-10-CM

## 2022-05-30 DIAGNOSIS — F902 Attention-deficit hyperactivity disorder, combined type: Secondary | ICD-10-CM

## 2022-05-30 DIAGNOSIS — E032 Hypothyroidism due to medicaments and other exogenous substances: Secondary | ICD-10-CM

## 2022-05-30 DIAGNOSIS — F99 Mental disorder, not otherwise specified: Secondary | ICD-10-CM | POA: Diagnosis not present

## 2022-05-30 DIAGNOSIS — G2401 Drug induced subacute dyskinesia: Secondary | ICD-10-CM | POA: Diagnosis not present

## 2022-05-30 DIAGNOSIS — F250 Schizoaffective disorder, bipolar type: Secondary | ICD-10-CM

## 2022-05-30 MED ORDER — LEVOTHYROXINE SODIUM 25 MCG PO TABS: ORAL_TABLET | ORAL | 0 refills | Status: DC

## 2022-05-30 MED ORDER — ATOMOXETINE HCL 80 MG PO CAPS: 80.0000 mg | ORAL_CAPSULE | Freq: Every day | ORAL | 2 refills | Status: DC

## 2022-05-30 MED ORDER — ZALEPLON 10 MG PO CAPS: ORAL_CAPSULE | ORAL | 1 refills | Status: DC

## 2022-05-30 MED ORDER — ALPRAZOLAM 1 MG PO TABS: 1.0000 mg | ORAL_TABLET | Freq: Three times a day (TID) | ORAL | 2 refills | Status: DC | PRN

## 2022-05-30 NOTE — Progress Notes (Signed)
[facility]  Patient ID: Matthew [name],  MRN: [id number]  PCP: [name], MD  Date of Evaluation: 05/30/2022 time spent:40 minutes  Chief Complaint:  Chief Complaint   Anxiety; Depression; ADD; Insomnia; Follow-up    HISTORY/CURRENT STATUS For routine med check.  A month ago we changed Klonopin to Xanax. The xanax doesn't last as long as the klonopin did, but works quicker, which is what he needs more now.  Not having panic attacks but is often overwhelmed. Xanax is effective.   Austedo XR has helped his abnormal mouth movements a lot, we had increased the dose at the last visit, but he was never told it was ready for pickup. States his current dose is working so doesn't feel like he needs to increase now.  Trazodone helps him fall asleep but wakes up in 4 hours and can't go back to sleep. Wonders if there's something else we can do. Has taken Mirtazepine, doesn't think it worked.  Took Sonata a long time ago. Not sure of efficacy.   Asks to increase the Strattera. It's still not working as well as he'd like, with focus and getting things done.   Patient is able to enjoy things when possible.  Doesn't do much though, he and his dad care for his mom who had a stroke.  Energy and motivation are fair to good most days. Unable to work d/t physical and mental health reasons.  No extreme sadness, tearfulness, or feelings of hopelessness.  ADLs and personal hygiene are normal.   Appetite has not changed.  Weight is stable.  Denies suicidal or homicidal thoughts.  Patient denies increased energy with decreased need for sleep, increased talkativeness, racing thoughts, impulsivity or risky behaviors, increased spending, increased libido, grandiosity, increased irritability or anger, paranoia, or hallucinations.  Review of Systems  Constitutional: Negative.   HENT: Negative.    Eyes: Negative.   Respiratory: Negative.    Cardiovascular: Negative.   Gastrointestinal:  Negative.   Genitourinary: Negative.   Musculoskeletal: Negative.   Skin: Negative.   Neurological: Negative.   Endo/Heme/Allergies: Negative.   Psychiatric/Behavioral:         See HPI     Individual Medical History/ Review of Systems: Changes? :No     Past medications for mental health diagnoses include: Risperdal, Zyprexa, Abilify, [name], Klonopin, Gabapentin, Xanax, Valium, Ativan, Hydroxyzine, Trilafon, Atenolol, Cymbalta, Prozac, Lexapro, Paxil, Lithium, Adderall, Ritalin, Mirtazepine, Sonata was ineffective.  Allergies: Gluten meal and Lactose intolerance (gi)  Current Medications:  Current Outpatient Medications:    aspirin EC 81 MG tablet, Take 1 tablet (81 mg total) by mouth daily. Swallow whole., Disp: 90 tablet, Rfl: 3   atomoxetine (STRATTERA) 80 MG capsule, Take 1 capsule (80 mg total) by mouth daily., Disp: 30 capsule, Rfl: 2   atorvastatin (LIPITOR) 80 MG tablet, Take 1 tablet (80 mg total) by mouth every morning., Disp: 90 tablet, Rfl: 3   Boswellia-Glucosamine-Vit D (OSTEO BI-FLEX ONE PER DAY) TABS, Take 1 tablet by mouth daily., Disp: , Rfl:    carvedilol (COREG) 25 MG tablet, Take 1.5 tablets (37.5 mg total) by mouth 2 (two) times daily., Disp: 270 tablet, Rfl: 3   clopidogrel (PLAVIX) 75 MG tablet, Take 1 tablet (75 mg total) by mouth daily., Disp: 90 tablet, Rfl: 3   Coenzyme Q10 (CO Q-10 PO), Take 1 capsule by mouth daily., Disp: , Rfl:    Continuous Blood Gluc Sensor (DEXCOM G7 SENSOR) MISC, 1 Device by Does not apply route as directed.,  Disp: 9 each, Rfl: 3   Deutetrabenazine ER (AUSTEDO XR) 24 MG TB24, Take 24 mg by mouth daily., Disp: 30 tablet, Rfl: 11   DULoxetine (CYMBALTA) 30 MG capsule, Take 1 capsule (30 mg total) by mouth daily. Take 1 with the 60 mg=90 mg daily, Disp: 90 capsule, Rfl: 0   DULoxetine (CYMBALTA) 60 MG capsule, Take 1 capsule (60 mg total) by mouth daily. Take with 30 mg=90 mg daily., Disp: 90 capsule, Rfl: 0   empagliflozin (JARDIANCE)  25 MG TABS tablet, Take 1 tablet (25 mg total) by mouth daily before breakfast., Disp: 90 tablet, Rfl: 3   fenofibrate (TRICOR) 145 MG tablet, Take 1 tablet (145 mg total) by mouth daily., Disp: 90 tablet, Rfl: 3   icosapent Ethyl (VASCEPA) 1 g capsule, Take 2 capsules (2 g total) by mouth 2 (two) times daily., Disp: 360 capsule, Rfl: 3   insulin glargine (LANTUS SOLOSTAR) 100 UNIT/ML Solostar Pen, Inject 25 Units into the skin daily., Disp: 30 mL, Rfl: 3   Insulin Pen Needle 32G X 4 MM MISC, 1 Device by Does not apply route daily., Disp: 150 each, Rfl: 3   lithium carbonate (LITHOBID) 300 MG ER tablet, TAKE 2 TABLETS BY MOUTH TWICE A DAY, Disp: 360 tablet, Rfl: 0   metFORMIN (GLUCOPHAGE) 1000 MG tablet, Take 1 tablet (1,000 mg total) by mouth 2 (two) times daily with a meal., Disp: 180 tablet, Rfl: 3   perphenazine (TRILAFON) 16 MG tablet, TAKE 1 TABLET EVERY MORNING AND 2 TABLETS AT BEDTIME, Disp: 270 tablet, Rfl: 0   pregabalin (LYRICA) 100 MG capsule, Take 1 capsule (100 mg total) by mouth 2 (two) times daily., Disp: 60 capsule, Rfl: 1   Pseudoephedrine HCl (SUDAFED 12 HOUR PO), Take 1 tablet by mouth daily as needed (congestion)., Disp: , Rfl:    traZODone (DESYREL) 100 MG tablet, Take 3 tablets (300 mg total) by mouth at bedtime., Disp: 270 tablet, Rfl: 0   TURMERIC CURCUMIN PO, Take 1 capsule by mouth daily., Disp: , Rfl:    zaleplon (SONATA) 10 MG capsule, 1 po qhs prn and may repeat 1 for midnocturnal awakening, as long as he has 3 hours left to sleep., Disp: 60 capsule, Rfl: 1   ALPRAZolam (XANAX) 1 MG tablet, Take 1 tablet (1 mg total) by mouth 3 (three) times daily as needed for anxiety., Disp: 90 tablet, Rfl: 2   levothyroxine (SYNTHROID) 25 MCG tablet, TAKE 1 TABLET IN THE MORNING WITH A GLASS OF WATER, WAIT 1 HOUR BEFORE TAKING OTHER MEDS OR EATING, Disp: 90 tablet, Rfl: 0   nitroGLYCERIN (NITROSTAT) 0.4 MG SL tablet, Place 1 tablet (0.4 mg total) under the tongue every 5 (five) minutes  as needed for chest pain., Disp: 100 tablet, Rfl: 3   spironolactone (ALDACTONE) 25 MG tablet, Take 0.5 tablets (12.5 mg total) by mouth daily. (Patient not taking: Reported on 10/13/2021), Disp: 45 tablet, Rfl: 3 Medication Side Effects: none  Family Medical/ Social History: Changes? no  MENTAL HEALTH EXAM:  There were no vitals taken for this visit.There is no height or weight on file to calculate BMI.  General Appearance: Casual, Well Groomed, and Obese  Eye Contact:  Good  Speech:  Clear and Coherent and Normal Rate  Volume:  Normal  Mood:  Anxious  Affect:  Congruent  Thought Process:  Goal Directed and Descriptions of Associations: Circumstantial  Orientation:  Full (Time, Place, and Person)  Thought Content: Logical   Suicidal Thoughts:  No  Homicidal Thoughts:  No  Memory:  WNL  Judgement:  Fair  Insight:  Fair  Psychomotor Activity: normal  Concentration:  Concentration: Fair and Attention Span: Fair  Recall:  Good  Fund of Knowledge: Good  Language: NA  Assets:  Desire for Improvement  ADL's:  Intact  Cognition: WNL  Prognosis:  Fair   Labs 05/01/2022 Lithium level 1.0 Glucose 223, creatinine 1.31, CO2 17, calcium 10.3 TSH 1.96  AIMS    Flowsheet Row Office Visit from 05/30/2022 in [facility] Visit from 04/20/2022 in [facility] Visit from 03/21/2022 in [facility] Total Score 3 7 17       PHQ2-9    Flowsheet Row CARDIAC REHAB PHASE II ORIENTATION from 08/16/2020 in [facility]  PHQ-2 Total Score 0      Flowsheet Row Admission (Discharged) from 11/28/2021 in [facility] from 11/17/2021 in [facility] ED from 07/04/2021 in [facility] No Risk No Risk No Risk      DIAGNOSES:    ICD-10-CM    1. Schizoaffective disorder, bipolar type ([place])  F25.0     2. Generalized anxiety disorder  F41.1     3. Attention deficit hyperactivity disorder (ADHD), combined type, moderate  F90.2     4. Tardive dyskinesia  G24.01     5. Recurrent major depression in partial remission ([facility])  F33.41     6. 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 Insomnia due to other mental disorder  F51.05    F99       Receiving Psychotherapy: No   RECOMMENDATIONS:  PDMP reviewed.  Xanax filled 05/28/2022. I provided 40 minutes of face to face time during this encounter, including time spent before and after the visit in records review, medical decision making, counseling pertinent to today's visit, and charting.   Recommend increasing the Strattera.  He would like to try a higher dose. He is responding well to the Xanax so no change there. Tardive dyskinesia is well controlled so no reason to increase the [place]. Discussed the slightly elevated calcium and creatinine.  He understands that these abnormalities can be caused by lithium and we will watch closely. Sleep hygiene was discussed.  The trazodone is effective in getting him to sleep but not keeping him asleep.  Recommend retrying Sonata.  Benefits, risk and side effects were discussed and he accepts.  Continue Xanax 1 mg, 1 po tid prn anxiety.  Increase Strattera to 80 mg,, 1 p.o. daily. Continue Austedo XR 24 mg daily. Continue Cymbalta 90 mg daily.  Continue levothyroxine 25 mg every morning. Continue lithium CR 300 mg, 2 po q am, 2 qhs. Continue perphenazine 16 mg, 1 po q am, 2 qhs. Continue Lyrica 100 mg, 1 p.o. twice daily. Continue trazodone 100 mg, 3 p.o. nightly as needed sleep. Restart Sonata 10 mg, 1 p.o. nightly as needed and may repeat one p.o. for mid nocturnal awakening as long as he has 3 hours left to sleep.  For now he will continue trazodone at bedtime and only use the Sonata for mid nocturnal awakening. Will order labs at  next visit. Return in 6 weeks.  Donnal Moat, PA-C

## 2022-06-01 ENCOUNTER — Emergency Department (HOSPITAL_COMMUNITY): Payer: Medicare HMO

## 2022-06-01 ENCOUNTER — Other Ambulatory Visit: Payer: Self-pay

## 2022-06-01 ENCOUNTER — Observation Stay (HOSPITAL_COMMUNITY)
Admission: EM | Admit: 2022-06-01 | Discharge: 2022-06-02 | Disposition: A | Discharge status: Home / Self Care | Payer: Medicare HMO | Attending: Family Medicine | Admitting: Family Medicine

## 2022-06-01 ENCOUNTER — Encounter (HOSPITAL_COMMUNITY): Payer: Self-pay

## 2022-06-01 DIAGNOSIS — G4700 Insomnia, unspecified: Secondary | ICD-10-CM

## 2022-06-01 DIAGNOSIS — I2510 Atherosclerotic heart disease of native coronary artery without angina pectoris: Secondary | ICD-10-CM | POA: Diagnosis not present

## 2022-06-01 DIAGNOSIS — Z96611 Presence of right artificial shoulder joint: Secondary | ICD-10-CM | POA: Diagnosis not present

## 2022-06-01 DIAGNOSIS — Z955 Presence of coronary angioplasty implant and graft: Secondary | ICD-10-CM | POA: Diagnosis not present

## 2022-06-01 DIAGNOSIS — T424X2A Poisoning by benzodiazepines, intentional self-harm, initial encounter: Secondary | ICD-10-CM | POA: Diagnosis not present

## 2022-06-01 DIAGNOSIS — Z87891 Personal history of nicotine dependence: Secondary | ICD-10-CM | POA: Insufficient documentation

## 2022-06-01 DIAGNOSIS — E1140 Type 2 diabetes mellitus with diabetic neuropathy, unspecified: Secondary | ICD-10-CM | POA: Diagnosis not present

## 2022-06-01 DIAGNOSIS — F902 Attention-deficit hyperactivity disorder, combined type: Secondary | ICD-10-CM | POA: Insufficient documentation

## 2022-06-01 DIAGNOSIS — I10 Essential (primary) hypertension: Secondary | ICD-10-CM | POA: Diagnosis not present

## 2022-06-01 DIAGNOSIS — Z7902 Long term (current) use of antithrombotics/antiplatelets: Secondary | ICD-10-CM | POA: Insufficient documentation

## 2022-06-01 DIAGNOSIS — Z1152 Encounter for screening for COVID-19: Secondary | ICD-10-CM | POA: Insufficient documentation

## 2022-06-01 DIAGNOSIS — Z7984 Long term (current) use of oral hypoglycemic drugs: Secondary | ICD-10-CM | POA: Insufficient documentation

## 2022-06-01 DIAGNOSIS — Z794 Long term (current) use of insulin: Secondary | ICD-10-CM | POA: Diagnosis not present

## 2022-06-01 DIAGNOSIS — Z7982 Long term (current) use of aspirin: Secondary | ICD-10-CM | POA: Insufficient documentation

## 2022-06-01 DIAGNOSIS — F250 Schizoaffective disorder, bipolar type: Secondary | ICD-10-CM | POA: Diagnosis present

## 2022-06-01 DIAGNOSIS — R0682 Tachypnea, not elsewhere classified: Secondary | ICD-10-CM | POA: Insufficient documentation

## 2022-06-01 DIAGNOSIS — R55 Syncope and collapse: Secondary | ICD-10-CM | POA: Diagnosis not present

## 2022-06-01 DIAGNOSIS — E039 Hypothyroidism, unspecified: Secondary | ICD-10-CM | POA: Insufficient documentation

## 2022-06-01 DIAGNOSIS — R4182 Altered mental status, unspecified: Secondary | ICD-10-CM | POA: Insufficient documentation

## 2022-06-01 DIAGNOSIS — F411 Generalized anxiety disorder: Secondary | ICD-10-CM | POA: Diagnosis present

## 2022-06-01 DIAGNOSIS — R0902 Hypoxemia: Secondary | ICD-10-CM | POA: Diagnosis not present

## 2022-06-01 DIAGNOSIS — E782 Mixed hyperlipidemia: Secondary | ICD-10-CM | POA: Diagnosis present

## 2022-06-01 DIAGNOSIS — E1142 Type 2 diabetes mellitus with diabetic polyneuropathy: Secondary | ICD-10-CM

## 2022-06-01 DIAGNOSIS — R739 Hyperglycemia, unspecified: Secondary | ICD-10-CM | POA: Diagnosis not present

## 2022-06-01 DIAGNOSIS — R7989 Other specified abnormal findings of blood chemistry: Secondary | ICD-10-CM | POA: Insufficient documentation

## 2022-06-01 DIAGNOSIS — Z79899 Other long term (current) drug therapy: Secondary | ICD-10-CM | POA: Diagnosis not present

## 2022-06-01 DIAGNOSIS — T424X1A Poisoning by benzodiazepines, accidental (unintentional), initial encounter: Secondary | ICD-10-CM

## 2022-06-01 DIAGNOSIS — Z720 Tobacco use: Secondary | ICD-10-CM | POA: Diagnosis present

## 2022-06-01 DIAGNOSIS — G4733 Obstructive sleep apnea (adult) (pediatric): Secondary | ICD-10-CM | POA: Diagnosis present

## 2022-06-01 DIAGNOSIS — R0689 Other abnormalities of breathing: Secondary | ICD-10-CM | POA: Diagnosis not present

## 2022-06-01 DIAGNOSIS — R569 Unspecified convulsions: Secondary | ICD-10-CM | POA: Diagnosis not present

## 2022-06-01 LAB — COMPREHENSIVE METABOLIC PANEL
ALT: 37 U/L (ref 0–44)
AST: 30 U/L (ref 15–41)
Albumin: 4.1 g/dL (ref 3.5–5.0)
Alkaline Phosphatase: 77 U/L (ref 38–126)
Anion gap: 12 (ref 5–15)
BUN: 17 mg/dL (ref 6–20)
CO2: 21 mmol/L — ABNORMAL LOW (ref 22–32)
Calcium: 9.1 mg/dL (ref 8.9–10.3)
Chloride: 101 mmol/L (ref 98–111)
Creatinine, Ser: 1.27 mg/dL — ABNORMAL HIGH (ref 0.61–1.24)
GFR, Estimated: >60 mL/min (ref >60)
Glucose, Bld: 308 mg/dL — ABNORMAL HIGH (ref 70–99)
Potassium: 3.8 mmol/L (ref 3.5–5.1)
Sodium: 134 mmol/L — ABNORMAL LOW (ref 135–145)
Total Bilirubin: 0.5 mg/dL (ref 0.3–1.2)
Total Protein: 6.8 g/dL (ref 6.5–8.1)

## 2022-06-01 LAB — CBC WITH DIFFERENTIAL/PLATELET
Abs Immature Granulocytes: 0.11 10*3/uL — ABNORMAL HIGH (ref 0.00–0.07)
Basophils Absolute: 0.2 10*3/uL — ABNORMAL HIGH (ref 0.0–0.1)
Basophils Relative: 1 %
Eosinophils Absolute: 0.7 10*3/uL — ABNORMAL HIGH (ref 0.0–0.5)
Eosinophils Relative: 5 %
HCT: 43.6 % (ref 39.0–52.0)
Hemoglobin: 14.9 g/dL (ref 13.0–17.0)
Immature Granulocytes: 1 %
Lymphocytes Relative: 15 %
Lymphs Abs: 2.2 10*3/uL (ref 0.7–4.0)
MCH: 28.6 pg (ref 26.0–34.0)
MCHC: 34.2 g/dL (ref 30.0–36.0)
MCV: 83.7 fL (ref 80.0–100.0)
Monocytes Absolute: 0.9 10*3/uL (ref 0.1–1.0)
Monocytes Relative: 6 %
Neutro Abs: 10.8 10*3/uL — ABNORMAL HIGH (ref 1.7–7.7)
Neutrophils Relative %: 72 %
Platelets: 248 10*3/uL (ref 150–400)
RBC: 5.21 MIL/uL (ref 4.22–5.81)
RDW: 15.1 % (ref 11.5–15.5)
WBC: 14.9 10*3/uL — ABNORMAL HIGH (ref 4.0–10.5)
nRBC: 0 % (ref 0.0–0.2)

## 2022-06-01 LAB — I-STAT ARTERIAL BLOOD GAS, ED
Acid-base deficit: 4 mmol/L — ABNORMAL HIGH (ref 0.0–2.0)
Acid-base deficit: 2 mmol/L (ref 0.0–2.0)
Bicarbonate: 23.3 mmol/L (ref 20.0–28.0)
Bicarbonate: 23.3 mmol/L (ref 20.0–28.0)
Calcium, Ion: 1.27 mmol/L (ref 1.15–1.40)
Calcium, Ion: 1.21 mmol/L (ref 1.15–1.40)
HCT: 42 % (ref 39.0–52.0)
HCT: 42 % (ref 39.0–52.0)
Hemoglobin: 14.3 g/dL (ref 13.0–17.0)
Hemoglobin: 14.3 g/dL (ref 13.0–17.0)
O2 Saturation: 94 %
O2 Saturation: 97 %
Potassium: 3.6 mmol/L (ref 3.5–5.1)
Potassium: 3.6 mmol/L (ref 3.5–5.1)
Sodium: 139 mmol/L (ref 135–145)
Sodium: 141 mmol/L (ref 135–145)
TCO2: 25 mmol/L (ref 22–32)
TCO2: 24 mmol/L (ref 22–32)
pCO2 arterial: 47.6 mmHg (ref 32–48)
pCO2 arterial: 38.6 mmHg (ref 32–48)
pH, Arterial: 7.298 — ABNORMAL LOW (ref 7.35–7.45)
pH, Arterial: 7.388 (ref 7.35–7.45)
pO2, Arterial: 78 mmHg — ABNORMAL LOW (ref 83–108)
pO2, Arterial: 87 mmHg (ref 83–108)

## 2022-06-01 LAB — I-STAT CHEM 8, ED
BUN: 18 mg/dL (ref 6–20)
Calcium, Ion: 1.08 mmol/L — ABNORMAL LOW (ref 1.15–1.40)
Chloride: 103 mmol/L (ref 98–111)
Creatinine, Ser: 1.2 mg/dL (ref 0.61–1.24)
Glucose, Bld: 323 mg/dL — ABNORMAL HIGH (ref 70–99)
HCT: 45 % (ref 39.0–52.0)
Hemoglobin: 15.3 g/dL (ref 13.0–17.0)
Potassium: 3.7 mmol/L (ref 3.5–5.1)
Sodium: 136 mmol/L (ref 135–145)
TCO2: 21 mmol/L — ABNORMAL LOW (ref 22–32)

## 2022-06-01 LAB — RESP PANEL BY RT-PCR (RSV, FLU A&B, COVID)  RVPGX2
Influenza A by PCR: NEGATIVE
Influenza B by PCR: NEGATIVE
Resp Syncytial Virus by PCR: NEGATIVE
SARS Coronavirus 2 by RT PCR: NEGATIVE

## 2022-06-01 LAB — RAPID URINE DRUG SCREEN, HOSP PERFORMED
Amphetamines: NOT DETECTED
Barbiturates: NOT DETECTED
Benzodiazepines: POSITIVE — AB
Cocaine: NOT DETECTED
Opiates: NOT DETECTED
Tetrahydrocannabinol: NOT DETECTED

## 2022-06-01 LAB — CBG MONITORING, ED: Glucose-Capillary: 279 mg/dL — ABNORMAL HIGH (ref 70–99)

## 2022-06-01 LAB — SALICYLATE LEVEL: Salicylate Lvl: <7 mg/dL — ABNORMAL LOW (ref 7.0–30.0)

## 2022-06-01 LAB — HIV ANTIBODY (ROUTINE TESTING W REFLEX): HIV Screen 4th Generation wRfx: NONREACTIVE

## 2022-06-01 LAB — GLUCOSE, CAPILLARY
Glucose-Capillary: 157 mg/dL — ABNORMAL HIGH (ref 70–99)
Glucose-Capillary: 157 mg/dL — ABNORMAL HIGH (ref 70–99)

## 2022-06-01 LAB — LITHIUM LEVEL: Lithium Lvl: 0.39 mmol/L — ABNORMAL LOW (ref 0.60–1.20)

## 2022-06-01 LAB — LACTIC ACID, PLASMA: Lactic Acid, Venous: 1.2 mmol/L (ref 0.5–1.9)

## 2022-06-01 LAB — ACETAMINOPHEN LEVEL: Acetaminophen (Tylenol), Serum: <10 ug/mL — ABNORMAL LOW (ref 10–30)

## 2022-06-01 LAB — ETHANOL: Alcohol, Ethyl (B): <10 mg/dL (ref <10)

## 2022-06-01 MED ORDER — SODIUM CHLORIDE 0.9 % IV SOLN: INTRAVENOUS | Status: DC

## 2022-06-01 MED ORDER — SODIUM CHLORIDE 0.9 % IV BOLUS
2000.0000 mL | Freq: Once | INTRAVENOUS | Status: AC
  Administered 2022-06-01: 2000 mL via INTRAVENOUS

## 2022-06-01 MED ORDER — NICOTINE 14 MG/24HR TD PT24
14.0000 mg | MEDICATED_PATCH | Freq: Every day | TRANSDERMAL | Status: DC
  Administered 2022-06-01 – 2022-06-02 (×2): 14 mg via TRANSDERMAL
  Filled 2022-06-01 (×2): qty 1

## 2022-06-01 MED ORDER — NALOXONE HCL 2 MG/2ML IJ SOSY
1.0000 mg | PREFILLED_SYRINGE | Freq: Once | INTRAMUSCULAR | Status: AC
  Administered 2022-06-01: 1 mg via INTRAVENOUS
  Filled 2022-06-01: qty 2

## 2022-06-01 MED ORDER — SODIUM CHLORIDE 0.9 % IV SOLN: INTRAVENOUS | Status: AC

## 2022-06-01 MED ORDER — INSULIN ASPART 100 UNIT/ML IJ SOLN
0.0000 [IU] | Freq: Three times a day (TID) | INTRAMUSCULAR | Status: DC
  Administered 2022-06-02 (×2): 2 [IU] via SUBCUTANEOUS

## 2022-06-01 MED ORDER — ENOXAPARIN SODIUM 40 MG/0.4ML IJ SOSY
40.0000 mg | PREFILLED_SYRINGE | INTRAMUSCULAR | Status: DC
  Administered 2022-06-01: 40 mg via SUBCUTANEOUS
  Filled 2022-06-01: qty 0.4

## 2022-06-01 NOTE — Progress Notes (Signed)
NEW ADMISSION NOTE New Admission Note: Patient to floor, lethargic. Has been able to answer a couple of questions and goes back to sleep.  Arrival Method: stretcher Mental Orientation: lethargic, alert to self Telemetry: yes Assessment: Completed Skin:  redness on sacrum, foam applied IV:LT AC, RT wrist Pain: Tubes: Safety Measures: Safety Fall Prevention Plan has been given, discussed and implemented. Admission: Completed [facility] Orientation: Patient has been orientated to the room, unit and staff.  Family:  Orders have been reviewed and implemented. Will continue to monitor the patient. Call light has been placed within reach and bed alarm has been activated.   Keenan Bachelor, RN

## 2022-06-01 NOTE — Assessment & Plan Note (Addendum)
Patient noncompliant with CPAP per chart review.   - Offer CPAP when patient back to baseline mental status

## 2022-06-01 NOTE — Assessment & Plan Note (Addendum)
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 Gradually improving on exam.  No past suicide attempts.   - Consult psychiatry, appreciate recommendations  - Continue to monitor, gradually improving - Initially 4L O2 by [place] now weaned to RA - continuous pulse ox and tele

## 2022-06-01 NOTE — ED Triage Notes (Signed)
Pt BIB EMS. Per EMS, pt called his father @ 0400 saying that he did not feel right and took five 1mg  xanax at that time. Pt's father found pt on the floor of apartment hallway with urinary incontinence and AMS. Pt currently A/Ox3, but very lethargic. Exhibits slurred speech and labored breathing.

## 2022-06-01 NOTE — ED Notes (Signed)
Pt was cleared by MD Regenia Skeeter to give PO fluids. Pt took several sips of water and 20 seconds after began to cough. Pt stated "that was just a cough, not from the water". RN chose to delay any further PO intake at this time and will re-eval after patient becomes more alert.

## 2022-06-01 NOTE — Assessment & Plan Note (Addendum)
No longer AMS. Home meds: perphenazine 16 mg AM and 32 mg PM and lithium carbonate 600 mg daily, deutetrabenazine 24 mg daily for TD.  Lithium level possibly subtherapeutic. - Psych following, appreciate recs - Restart home medications after psych consult

## 2022-06-01 NOTE — Assessment & Plan Note (Addendum)
Cr 1.27 on admission.  Baseline ~1.  Concern for Lithium elevating Cr, but serum Lithium resulted low.  - 150 mL/hr NS IVF started - Daily BMP, continue to monitor

## 2022-06-01 NOTE — Assessment & Plan Note (Addendum)
Home med:  trazodone 300 mg and zaleplon 10 mg QHS.   - trazodone 300 mg qhs

## 2022-06-01 NOTE — ED Notes (Signed)
Patient is attempting to get out of bed. Patient redirected and encouraged to stay in bed per orders. ECG fixed and placed back onto patient.

## 2022-06-01 NOTE — ED Provider Notes (Signed)
[facility] EMERGENCY DEPARTMENT Provider Note   CSN: [id number] Arrival date & time: 06/01/22  0631     History  Chief Complaint  Patient presents with   Altered Mental Status    NILAY MANGRUM is a 47 y.o. male.  HPI 47 year old male presents with generalized weakness.  Patient tells me that both of his legs are weak.  Unfortunately he is a poor historian due to he appears intoxicated and states that he took five 1 mg Xanax this morning around 4 AM.  EMS was called by his father as the patient had fallen onto the floor and could not get up due to weakness.  His dad tells me he did not know about the Xanax part this morning.  The patient is denying any acute pain currently.  When asked him specifically about why he took some much Xanax, he tells me he thought it would be "fun".  He denies feeling ill prior to taking the medicine.  He denies headache or chest pain at this time.  EMS reported to the triage nurse that he had urinary incontinence on their arrival.  Prior to me seeing him, he had been started on 2 L of fluids and given a milligram of Narcan without any change.  Home Medications Prior to Admission medications   Medication Sig Start Date End Date Taking? Authorizing Provider  ALPRAZolam Duanne Moron) 1 MG tablet Take 1 tablet (1 mg total) by mouth 3 (three) times daily as needed for anxiety. 05/30/22  Yes Donnal Moat T, PA-C  atomoxetine (STRATTERA) 80 MG capsule Take 1 capsule (80 mg total) by mouth daily. 05/30/22  Yes Addison Lank, PA-C  aspirin EC 81 MG tablet Take 1 tablet (81 mg total) by mouth daily. Swallow whole. 06/07/20   Freada Bergeron, MD  atorvastatin (LIPITOR) 80 MG tablet Take 1 tablet (80 mg total) by mouth every morning. 08/15/21   Freada Bergeron, MD  Boswellia-Glucosamine-Vit D (OSTEO BI-FLEX ONE PER DAY) TABS Take 1 tablet by mouth daily.    [provider]  carvedilol (COREG) 25 MG tablet Take 1.5 tablets (37.5 mg total)  by mouth 2 (two) times daily. 05/31/21   Lenna Sciara, NP  clopidogrel (PLAVIX) 75 MG tablet Take 1 tablet (75 mg total) by mouth daily. 11/10/21   Freada Bergeron, MD  Coenzyme Q10 (CO Q-10 PO) Take 1 capsule by mouth daily.    [provider]  Continuous Blood Gluc Sensor (DEXCOM G7 SENSOR) MISC 1 Device by Does not apply route as directed. 03/19/22   Shamleffer, Melanie Crazier, MD  Deutetrabenazine ER (AUSTEDO XR) 24 MG TB24 Take 24 mg by mouth daily. 04/20/22   Donnal Moat T, PA-C  DULoxetine (CYMBALTA) 30 MG capsule Take 1 capsule (30 mg total) by mouth daily. Take 1 with the 60 mg=90 mg daily 03/21/22   Donnal Moat T, PA-C  DULoxetine (CYMBALTA) 60 MG capsule Take 1 capsule (60 mg total) by mouth daily. Take with 30 mg=90 mg daily. 03/21/22   Addison Lank, PA-C  empagliflozin (JARDIANCE) 25 MG TABS tablet Take 1 tablet (25 mg total) by mouth daily before breakfast. 03/19/22   Shamleffer, Melanie Crazier, MD  fenofibrate (TRICOR) 145 MG tablet Take 1 tablet (145 mg total) by mouth daily. 11/03/21   Freada Bergeron, MD  icosapent Ethyl (VASCEPA) 1 g capsule Take 2 capsules (2 g total) by mouth 2 (two) times daily. 12/24/21   Freada Bergeron, MD  insulin glargine (LANTUS SOLOSTAR) 100 UNIT/ML Solostar Pen Inject 25 Units into the skin daily. 03/19/22   Shamleffer, Konrad Dolores, MD  Insulin Pen Needle 32G X 4 MM MISC 1 Device by Does not apply route daily. 03/19/22   Shamleffer, Konrad Dolores, MD  levothyroxine (SYNTHROID) 25 MCG tablet TAKE 1 TABLET IN THE MORNING WITH A GLASS OF WATER, WAIT 1 HOUR BEFORE TAKING OTHER MEDS OR EATING 05/30/22   Melony Overly T, PA-C  lithium carbonate (LITHOBID) 300 MG ER tablet TAKE 2 TABLETS BY MOUTH TWICE A DAY 05/02/22   Hurst, Teresa T, PA-C  metFORMIN (GLUCOPHAGE) 1000 MG tablet Take 1 tablet (1,000 mg total) by mouth 2 (two) times daily with a meal. 03/19/22   Shamleffer, Konrad Dolores, MD  nitroGLYCERIN (NITROSTAT) 0.4 MG SL  tablet Place 1 tablet (0.4 mg total) under the tongue every 5 (five) minutes as needed for chest pain. 08/10/20 10/13/21  Georgie Chard D, NP  perphenazine (TRILAFON) 16 MG tablet TAKE 1 TABLET EVERY MORNING AND 2 TABLETS AT BEDTIME 02/21/22   Hurst, Rosey Bath T, PA-C  pregabalin (LYRICA) 100 MG capsule Take 1 capsule (100 mg total) by mouth 2 (two) times daily. 04/26/22   Mozingo, Thereasa Solo, NP  Pseudoephedrine HCl (SUDAFED 12 HOUR PO) Take 1 tablet by mouth daily as needed (congestion).    [provider]  spironolactone (ALDACTONE) 25 MG tablet Take 0.5 tablets (12.5 mg total) by mouth daily. Patient not taking: Reported on 10/13/2021 07/20/20 07/15/21  Meriam Sprague, MD  traZODone (DESYREL) 100 MG tablet Take 3 tablets (300 mg total) by mouth at bedtime. 12/13/21   Cherie Ouch, PA-C  TURMERIC CURCUMIN PO Take 1 capsule by mouth daily.    [provider]  zaleplon (SONATA) 10 MG capsule 1 po qhs prn and may repeat 1 for midnocturnal awakening, as long as he has 3 hours left to sleep. 05/30/22   Melony Overly T, PA-C      Allergies    Gluten meal and Lactose intolerance (gi)    Review of Systems   Review of Systems  Unable to perform ROS: Mental status change    Physical Exam Updated Vital Signs BP (!) 141/98 (BP Location: Right Arm)   Pulse 94   Temp 98.9 F (37.2 C) (Oral)   Resp (!) 24   SpO2 95%  Physical Exam Vitals and nursing note reviewed.  Constitutional:      Appearance: He is well-developed. He is obese.  HENT:     Head: Normocephalic and atraumatic.  Eyes:     Pupils: Pupils are equal, round, and reactive to light.  Cardiovascular:     Rate and Rhythm: Normal rate and regular rhythm.     Heart sounds: Normal heart sounds.  Pulmonary:     Effort: Pulmonary effort is normal. Tachypnea present. No accessory muscle usage.     Comments: Patient is tachypneic but snoring. Abdominal:     Palpations: Abdomen is soft.     Tenderness: There is no  abdominal tenderness.  Musculoskeletal:     Cervical back: No rigidity.  Skin:    General: Skin is warm and dry.  Neurological:     Mental Status: He is oriented to person, place, and time.     Comments: Patient is slow to respond and has slurred speech.  Hard for him to keep his eyes open.  However, while he answers in short and slow phrases he does answer questions appropriately.  He has normal  strength in his upper extremities and may be some symmetric lower extremity weakness in the legs.  Grossly normal sensation throughout.     ED Results / Procedures / Treatments   Labs (all labs ordered are listed, but only abnormal results are displayed) Labs Reviewed  CBC WITH DIFFERENTIAL/PLATELET - Abnormal; Notable for the following components:      Result Value   WBC 14.9 (*)    Neutro Abs 10.8 (*)    Eosinophils Absolute 0.7 (*)    Basophils Absolute 0.2 (*)    Abs Immature Granulocytes 0.11 (*)    All other components within normal limits  COMPREHENSIVE METABOLIC PANEL - Abnormal; Notable for the following components:   Sodium 134 (*)    CO2 21 (*)    Glucose, Bld 308 (*)    Creatinine, Ser 1.27 (*)    All other components within normal limits  ACETAMINOPHEN LEVEL - Abnormal; Notable for the following components:   Acetaminophen (Tylenol), Serum <10 (*)    All other components within normal limits  SALICYLATE LEVEL - Abnormal; Notable for the following components:   Salicylate Lvl <5.2 (*)    All other components within normal limits  RAPID URINE DRUG SCREEN, HOSP PERFORMED - Abnormal; Notable for the following components:   Benzodiazepines POSITIVE (*)    All other components within normal limits  LITHIUM LEVEL - Abnormal; Notable for the following components:   Lithium Lvl 0.39 (*)    All other components within normal limits  I-STAT CHEM 8, ED - Abnormal; Notable for the following components:   Glucose, Bld 323 (*)    Calcium, Ion 1.08 (*)    TCO2 21 (*)    All other  components within normal limits  CBG MONITORING, ED - Abnormal; Notable for the following components:   Glucose-Capillary 279 (*)    All other components within normal limits  I-STAT ARTERIAL BLOOD GAS, ED - Abnormal; Notable for the following components:   pH, Arterial 7.298 (*)    pO2, Arterial 78 (*)    Acid-base deficit 4.0 (*)    All other components within normal limits  RESP PANEL BY RT-PCR (RSV, FLU A&B, COVID)  RVPGX2  ETHANOL  LACTIC ACID, PLASMA  HIV ANTIBODY (ROUTINE TESTING W REFLEX)  I-STAT ARTERIAL BLOOD GAS, ED    EKG EKG Interpretation  Date/Time:  Friday June 01 2022 07:35:22 EST Ventricular Rate:  86 PR Interval:  228 QRS Duration: 115 QT Interval:  380 QTC Calculation: 455 R Axis:   170 Text Interpretation: Sinus rhythm Prolonged PR interval Left posterior fascicular block  no significant change since May 23 2021 Confirmed by Sherwood Gambler [phone]) on 06/01/2022 7:45:07 AM  Radiology CT Head Wo Contrast  Result Date: 06/01/2022 CLINICAL DATA:  47 year old male with altered mental status, found down. EXAM: CT HEAD WITHOUT CONTRAST TECHNIQUE: Contiguous axial images were obtained from the base of the skull through the vertex without intravenous contrast. RADIATION DOSE REDUCTION: This exam was performed according to the departmental dose-optimization program which includes automated exposure control, adjustment of the mA and/or kV according to patient size and/or use of iterative reconstruction technique. COMPARISON:  Head CT 04/23/2021. FINDINGS: Brain: Small chronic left posterior fossa extra-axial CSF collection on series 3, image 7 is unchanged since and earlier 2016 and appears inconsequential (most likely a small arachnoid cyst, normal variant). No midline shift, ventriculomegaly, mass effect, evidence of other mass lesion, intracranial hemorrhage or evidence of cortically based acute infarction. Gray-white matter differentiation is  within normal limits  throughout the brain. Stable cerebral volume. Vascular: No suspicious intracranial vascular hyperdensity. Skull: No acute osseous abnormality identified. Sinuses/Orbits: Visualized paranasal sinuses and mastoids are clear. Other: Visualized orbits and scalp soft tissues are within normal limits. IMPRESSION: No acute traumatic injury identified. Stable and negative noncontrast CT appearance of the brain. Electronically Signed   By: Odessa Fleming M.D.   On: 06/01/2022 12:18   DG Chest Port 1 View  Result Date: 06/01/2022 CLINICAL DATA:  Altered mental status. EXAM: PORTABLE CHEST 1 VIEW COMPARISON:  Pale at 05/23/21 FINDINGS: There is a low inspiration on exam. There is mild cardiomegaly without evidence of CHF. The mediastinum is normally outlined. There is bronchovascular crowding in the bases due to low inspiration. No focal pneumonia is seen. There is a chronically elevated right diaphragm. The sulci are sharp, as far as seen with the left lateral sulcus not filmed. Thoracic cage is intact. IMPRESSION: Low inspiration with bronchovascular crowding in the bases. No evidence of acute chest disease. Mild cardiomegaly. Chronically elevated right diaphragm. Electronically Signed   By: Almira Bar M.D.   On: 06/01/2022 07:37    Procedures .Critical Care  Performed by: Pricilla Loveless, MD Authorized by: Pricilla Loveless, MD   Critical care provider statement:    Critical care time (minutes):  40   Critical care time was exclusive of:  Separately billable procedures and treating other patients   Critical care was necessary to treat or prevent imminent or life-threatening deterioration of the following conditions:  CNS failure or compromise   Critical care was time spent personally by me on the following activities:  Development of treatment plan with patient or surrogate, discussions with consultants, evaluation of patient's response to treatment, examination of patient, ordering and review of laboratory studies,  ordering and review of radiographic studies, ordering and performing treatments and interventions, pulse oximetry, re-evaluation of patient's condition and review of old charts     Medications Ordered in ED Medications  enoxaparin (LOVENOX) injection 40 mg (has no administration in time range)  0.9 %  sodium chloride infusion (has no administration in time range)  nicotine (NICODERM CQ - dosed in mg/24 hours) patch 14 mg (has no administration in time range)  sodium chloride 0.9 % bolus 2,000 mL (0 mLs Intravenous Stopped 06/01/22 0826)  naloxone (NARCAN) injection 1 mg (1 mg Intravenous Given 06/01/22 0650)    ED Course/ Medical Decision Making/ A&P Clinical Course as of 06/01/22 1522  Fri Jun 01, 2022  0810 ABG reviewed/interpreted by myself.  He is mildly acidotic just under 7.3.  CO2 is only minimally elevated at 47.  I do not think he needs emergent intubation or BiPAP at this point.  Will continue to monitor.  He is still protecting his airway and awake and talking. [SG]  [id number] I discussed with nurse from poison control.  Patient is almost outside the 4-hour window he would typically need to be watched for Xanax and at this point we will just need to be watched based on symptoms and returning back to normal assuming no other overdose. [SG]  0935 Patient was found on the ground on his knees.  He was too weak to stand up and presumably was try to get some water after discussion with him and the nurse.  He still remains altered despite he probably should be better after the Xanax.  Due to this we will get a CT of his head given he is on Plavix though he reports  no head injury or fall. [SG]    Clinical Course User Index [SG] Pricilla Loveless, MD                           Medical Decision Making Amount and/or Complexity of Data Reviewed Labs: ordered.    Details: ABG with mild acidosis, improved on repeat Lithium level low Hyperglycemia without AG acidosis ETOH negative  Radiology:  ordered and independent interpretation performed.    Details: CT head without head bleed CXR - hypoinflation, no pneumonia ECG/medicine tests: independent interpretation performed.    Details: No acute ischemia  Risk Decision regarding hospitalization.   Patient presents with an intentional xanax overdose (though not for self-harm).  I think this explains his lethargy though despite being watched for plus hours after his initial ingestion, he still remains altered.  He is not febrile, hypotensive or hypertensive.  At this point I do not think this is infection related but he is also on multiple other meds that could cause sedation.  He does seem to be a little more alert though still confused.  Due to this a CT head was obtained since he is on Plavix but is overall unremarkable.  For now I think you will still need supportive care and thus will admit.  I discussed with the family practice service.  His repeat ABG does appear improved so I do not think he will need BiPAP or intubation.        Final Clinical Impression(s) / ED Diagnoses Final diagnoses:  Xanax overdose  Altered mental status, unspecified altered mental status type    Rx / DC Orders ED Discharge Orders     None         Pricilla Loveless, MD 06/01/22 1527

## 2022-06-01 NOTE — H&P (Addendum)
Hospital Admission History and Physical Service Pager: [phone]  Patient name: Matthew [name] Medical record number: [id number] Date of Birth: 29-Sep-1975 Age: 47 y.o. Gender: male  Primary Care Provider: [name], MD Consultants: Psych Code Status: FULL Preferred Emergency Contact: Sister Contact Information     Name Relation Home Work Mobile   [place] Sister [phone]  [phone]   Gwynn, Crossley Father [phone]     Briston, Lax Mother [phone]        Chief Complaint: AMS  *History and med list collected partially from chart review given patient's somnolence.  Medications not yet fully reconciled.  Assessment and Plan: RASMUS PREUSSER is a 47 y.o. male presenting with altered mental status in the setting of Xanax overdose.  Differential for this patient's presentation of this includes toxic encephalopathy, metabolic derangements, hypoglycemia, stroke, NMS, serotonin syndrome, alcohol intoxication, and infection.  CT scan reassuring for lack of stroke and presentation/history are not consistent.  Lack of rigidity, clonus on exam reassuring for lack of NMS and serotonin syndrome though these remain in differential because of patient's extensive psychotropic medicine list.  EtOH level was normal on admission.  Respiratory pathogen panel was unremarkable and patient is afebrile on admission, making infectious etiology less likely.  Of note, patient does have multiple CNS sedating medications including pregabalin, trazodone, and Xanax.  * Benzodiazepine (tranquilizer) overdose Most likely toxic encephalopathy 2/2 to xanax OD. Gradually improving on exam.  No past suicide attempts.   - Consult psychiatry, appreciate recommendations  - Continue to monitor, gradually improving - Initially 4L O2 by Rebecca now weaned to RA - continuous pulse ox and tele  Elevated serum creatinine Cr 1.27 on admission.  Baseline ~1.  Concern for Lithium elevating Cr, but serum Lithium  resulted low.  - 150 mL/hr NS IVF started - Daily BMP, continue to monitor  Insomnia Home med:  trazodone 300 mg and zaleplon 10 mg QHS.   - holding home meds while sedated   Type 2 diabetes mellitus with diabetic polyneuropathy, with long-term current use of insulin (HCC) A1c was 8.8 on 03/19/22.   Home meds: metformin 1000 mg BID, insulin glargine 25 units daily, Jardiance 25 mg daily, pregabalin 100 mg BID (diabetic neuropathy) - Hold meds pending successful swallow challenge - CBGs Q4H - sSSI while patient NPO  Tobacco abuse Patient has smoked 2 ppd for 2 years, requests nicotine patch on admission.  Tobacco cessation counseling outpatient recommended. - Nicotine patch 14 mg daily  Schizoaffective disorder, bipolar type without good prognostic features (HCC) Home meds: perphenazine 16 mg AM and 32 mg PM and lithium carbonate 600 mg daily, deutetrabenazine 24 mg daily for TD.  Lithium level possibly subtherapeutic. - Hold home medications while altered  - Consult psych given lithium level, increased creatinine, and multiple psychotropic meds  OSA (obstructive sleep apnea) Patient noncompliant with CPAP per chart review.   - Offer CPAP when patient back to baseline mental status  Chronic and stable - Hypothyroidism: Levothyroxine 25 mcg at home - HLD, CAD (Hx NSTEMI), HTN: Carvedilol 37.5 mg daily, Plavix 75 mg daily, spironolactone 12.5 mg daily, fenofibrate 145 mg daily - ADHD: Taking atomoxetine 80 mg at home - Depression: Duloxetine 90 mg daily  FEN/GI: NPO pending mental status improvement, 150 mL/hr NS IV VTE Prophylaxis: Lovenox subcutaneous  Disposition: Med-tele  History of Present Illness: Matthew [name] is a 47 y.o. male presenting with AMS likely d/t Xanax over dose.  The patient slurs his words and  is very somnolent throughout the conversation.  He is currently oriented to self, location, and situation, but not date (does know month).  He recalls taking 4-5  Xanax 1 mg overnight and arriving by ambulance.  States he does not remember why he took them all, but had been feeling anxious, denies feel depressed.  Also denies manic symptoms or recent boosts of energy.  Took other medications normally yesterday as far as he remembers.  He denies SI, HI, no auditory or visual hallucinations currently.  EtOH negative, has multiple sedating and psychoactive meds.  Has not returned to baseline. On lithium, has psych history.  In the ED, patient was A&Ox3, but lethargic with slurred speech and labored breathing.  Placed on 4L O2.  Unable to tolerate PO fluid challenge without significant coughing.  Poison control called and reportedly stated that patient should recover in a few hours from benzo overdose.  CXR showed low inspiration with bronchovascular crowding the bases, but no acute chest disease.  CT head without acute traumatic injury.  EKG showed prolonged PR interval, no ischemic changes and unchanged from 05/23/22.  Collateral by phone: Father Tasman Zapata) Called patient's father.  He reports that at 4:30 AM patient called him because was unable to stand when trying to get out of bed and fell onto floor.  Father immediately hung up and called 911.  Was unable to get into the patient's apartment because he does not have the keys to the doors.  Patient's father only later heard that patient may have taken too much anxiety medication.  Father wonders if this could have been a suicide attempt but is unsure and has no reason to believe this is the case at this time.  Father does report that patient had a history of suicidal ideation more than 8 years ago when he was living in [place].  Patient called parents from [place] to report that he was having mental health difficulties and was thinking of ending his life.  Patient then moved down to [facility] and was soon after hospitalized and diagnosed with schizoaffective disorder.  Patient's father does not know of any past  suicide attempts and believes patient was stable on his medications until recently.  Review Of Systems: Per HPI.  Pertinent Past Medical History: - Schizoaffective disorder - Paranoia - Depression - ADHD - Insomnia - Hypothyroidism - HTN - T2DM - OSA on CPAP - CAD - NSTEMI in 2022  Remainder reviewed in history tab.   Pertinent Past Surgical History: - Heart cath and angio (2022) - R shoulder arthroplasty (2023) - Coronary stent (2022) - Appendectomy  Remainder reviewed in history tab.  Pertinent Social History: Tobacco use: 2 PPD for past 2 years, wants nicotine patch Alcohol use: Rare occasions, last drank months ago Other Substance use: No Lives at [facility]; independent living for people with disabilities  Pertinent Family History: N/A  Important Outpatient Medications: Pending med rec, patient too somnolent for reliable med history on interview.  Objective: BP 124/89 (BP Location: Right Arm)   Pulse 95   Temp 98.7 F (37.1 C) (Oral)   Resp 20   SpO2 93%   General: Somnolent, able to sit up in bed, drifts to sleep during interview. NAD. Alert to self, place, situation, but not date. HEENT: Head: Normocephalic, atraumatic. Eyes: PERRLA. Pupils not dilated or constricted. No conjunctival erythema or scleral injections. Mouth/Oral: Dry mucous membranes. Neck: Supple, no nuchal rigidity. Cardiovascular: Regular rate and rhythm. Normal S1/S2. No murmurs, rubs, or gallops  appreciated. 2+ radial pulses. Pulmonary: Diffuse end expiratory wheezing and bibasilar crackles. No increased WOB, no accessory muscle usage on 4L O2, able to be weaned to room air. Abdominal: Normoactive bowel sounds. No tenderness to deep or light palpation. No rebound or guarding. Skin: Warm and dry. No rashes or notable lesions. Extremities: No peripheral edema bilaterally. 2+ radial pulses bilaterally. Capillary refill 2-3 seconds. Neuro: Limited by patient's somnolence. No focal  deficit grossly. Unable to assess strength and coordination. Psych: Slurred speech, somnolent. Unable to assess affect and mood. No delusions, flights of ideas. Insight and judgement normal. No tics, cogwheel rigidity, or repetitive blinking/chewing.  Labs:  CBC BMET  Recent Labs  Lab 06/01/22 0647 06/01/22 0654 06/01/22 1020  WBC 14.9*  --   --   HGB 14.9   < > 14.3  HCT 43.6   < > 42.0  PLT 248  --   --    < > = values in this interval not displayed.   Recent Labs  Lab 06/01/22 0647 06/01/22 0654 06/01/22 0802 06/01/22 1020  NA 134* 136   < > 141  K 3.8 3.7   < > 3.6  CL 101 103  --   --   CO2 21*  --   --   --   BUN 17 18  --   --   CREATININE 1.27* 1.20  --   --   GLUCOSE 308* 323*  --   --   CALCIUM 9.1  --   --   --    < > = values in this interval not displayed.     CBG (last 3)  Recent Labs    06/01/22 0732 06/01/22 1528  GLUCAP 279* 157*   Pertinent additional labs: - EtOH: Negative - Lactate: WNL - Lithium level: Low (0.39) - UDS: + benzos - Initial ABG: Resp Acidosis, pH 7.298 - Repeat ABG: WNL  EKG: Prolonged PR interval. No acute ischemic changes. Unchanged from 05/23/22  Imaging Studies Performed: - CXR: Low inspiration with bronchovascular crowding the bases (compared to CXR 05/23/21), but no acute chest disease. Mild cardiomegaly. - CT head: Normal  Dimitri Shitarev MS4, [employer] SOM, [facility] Intern pager: [phone], text pages welcome Secure chat group [place]    I was personally present and re-performed the exam and medical decision making and verified the service and findings are accurately documented in the student's note.  Precious Gilding, DO 06/01/2022 7:31 PM

## 2022-06-01 NOTE — Assessment & Plan Note (Addendum)
A1c was 8.8 on 03/19/22. CBG range 148-279 yesterday, most recently in 150's. Patient restarted on diet overnight.  - Start home glargine 10 units daily, home dose 25 units - Hold Jardiance and metformin - Start pregabalin 100 mg BID - CBGs Q4H - sSSI

## 2022-06-01 NOTE — Discharge Instructions (Signed)
Outpatient Providers   [facility] ([facility]) Group and individual counseling. [address]  [place], [place] [address] [phone] [place]: [phone]  [place]: [phone] Medicaid and uninsured.   [facility] groups multiple times per week. [place], [place], [place] [address] [phone] Takes Medicaid and other insurances.   [facility]ency Intensive Out[facility] Program (IOP) [address] [place], [place] [address] [phone] Takes Pharmacist, community and [place].   [facility]  IOP and Partial Hospitalization Program  [place].  [place], [place] [address] [phone] Private Insurance, [place] only for partial hospitalization     [facility] ([place]) IOP, individual counseling, medication management [place], [place] [address] [phone] Medicaid and [facility]  [place] [address]  [place], [place] [address] [phone] Private Insurance and [facility] [address]  [place], [place] [address] [phone] Private Insurance, [place], and Self Pay   [facility]: [facility]adone Clinic  [place], [place] [address] [facility]  [address]  [place], [place] [address] [phone]  [facility]  [address] [place], [place] [address] [phone]       Residential Treatment Programs  [facility] ([place], [place] [address] [phone] or [phone] Detox and Residential Rehab 14 days (Medicare, Medicaid, private insurance, and self pay)  [facility] [place], [place] [address] [phone] Detox (self Pay and Medicaid Limited availability) Rehab Only Male (Medicare, [place],  and Self Pay)  [facility] [address] [place], [place] [address] [phone] or [phone] Private Insurance only  [place]  [place]  [place], [place] [address] [phone] Treatment Only, must make assessment appointment, and must be sober for assessment appointment. Self pay, Medicare A and B, [facility], must be [facility] resident.      Residential Treatment [facility] [place] , [place]  [phone] [employer], [place], [place]. They offer assistance with transportation.   [facility] [address] [place],  [place], [place] [address] [phone] [facility] No insurance     [place], [place] [address] [phone] No pending legal charges, Long-term work program  [place]: [facility]  [place], [place] [address] [address], [place], [place] [address] Residential treatment (takes people on Methadone/Suboxone)  Medicaid and uninsured  [facility]  [address] [place], [place] [address] [phone] or [phone] [employer] Insurance Only [place] [phone] Private Insurance Males/Females, call to make referrals, multiple facilities   [facility] [address]  [place], [place] [address]  [phone] Men Only Upfront Fee [facility] [address]      [facility]: [facility] [place], [place] [address] [phone]  [place], [place] [address] [phone] (f)  [place], [place] [address] [phone] (f)       Syringe Services Program Due to COVID-19, syringe services programs are likely operating under different hours with  limited or no fixed site hours. Some programs may not be operating at all. Please contact the program directly using the phone numbers provided below to see if they are still operating under COVID-19.  [facility] Solution to the Opioid Problem (GCSTOP) Fixed; mobile; peer-based [place] [phone] [email or web address] Fixed site exchange at [facility], [place]. [place], [place] [address] on Wednesdays (2:00 - 5:00 pm) and Thursdays (4:00 - 8:00 pm). Pop-up mobile exchange locations: [employer] and [employer], [address] [place], [place] [address] on Tuesdays (11:00 am - 1:00 pm) and Fridays (11:00 am - 1:00 pm) [place] [address], [place], [place] [address] on Tuesdays (2:00 - 4:00 pm) and Fridays (2:00 - 4:00 pm) [facility] - also serves [place] and [employer] [place] [employer] Fixed; mobile; peer-based Rosemary [place] [phone] [email or web address] [address] [place], [place] [address] Delivery and outreach available in [place] and [place], please call for more information. Monday, Tuesday: 1:00 -7:00 pm Thursday: 4:00 pm - 8:00 pm Friday: 1:00 pm - 8:00 pm)

## 2022-06-01 NOTE — Progress Notes (Signed)
FMTS Interim Progress Note  S: Night rounded w/ Dr. Nancy Fetter. Pt was alert, sitting up at edge of bed, and urine in bed. Pt reports feeling better and more awake, asking for water.   Pt reports that he took 4 Xanax today for fun. He normally take Klonopin but reports that his doctor recently switched him to Xanax. He has only taken Xanax one other time prior to this OD. He denies SI. However, pt did report that he has been overwhelmed recently due to his mother having a stroke.   Pt also asking for his home med for sleep. He reports his home meds are: - Trazadone 300mg  - Lithium 600mg  - Perphenezine 16mg  - Coreg BID - Metformin 1000 BID  O: BP 124/89 (BP Location: Right Arm)   Pulse 95   Temp 98.7 F (37.1 C) (Oral)   Resp 20   SpO2 93%   Gen: Diaphoretic, alert. NAD. Neuro: A&O to person, place [facility]), and month (January), but not oriented to day of week ("Wednesday"). Responding appropriately in full sentences. Denies SI.  A/P: Benzo OD - Bedside swallow now that pt is more awake - Rest of plan per day team  Arlyce Dice, MD 06/01/2022, 9:33 PM PGY-1, [place] Medicine Service pager [phone]

## 2022-06-01 NOTE — ED Notes (Signed)
Floor contacted that pt room status is 40=+min and we have to send pt up.

## 2022-06-01 NOTE — ED Notes (Signed)
Patient is more consistently alert than he was prior so another PO fluid challenge was initiated. After patient took several sips of water he began to violently cough and drool at the mouth. PO challenge was stopped and patient will be kept NPO until he becomes more alert. Admitting MD will be notified

## 2022-06-01 NOTE — Assessment & Plan Note (Signed)
Patient has smoked 2 ppd for 2 years, requests nicotine patch on admission.  Tobacco cessation counseling outpatient recommended. - Nicotine patch 14 mg daily

## 2022-06-01 NOTE — Assessment & Plan Note (Deleted)
Etiology of patient's encephalopathy likely toxic in nature given history of 4-5 doses of Xanax taken overnight.  Presentation and exam consistent with gradually improving benzodiazepine toxicity.  No concern for intentional overdose at this time based on patient interview and chart review does not reveal any history of suicidal ideation or attempts.  Patient's father does endorse at least one episode of suicidal ideation in the past.  Patient denies SI/HI at this time and cites being anxious as reason for rapid intake of Xanax. - Consult psychiatry given patient's multiple psychotropic medications and possible worsening creatinine on lithium - Continue to monitor, gradually improving - Initially 4L O2 by [place] now weaned to RA, on continuous pulse ox and tele

## 2022-06-01 NOTE — Consult Note (Signed)
Brief Psychiatry Consult Note  Per documentation, patient has been largely somnolent and unable to fully participate in assessment. Patient will be seen tomorrow for full evaluation, pending he is able to engage.    Rosezetta Schlatter, MD PGY-2 06/01/2022  5:05 PM [place] Department of Psychiatry

## 2022-06-01 NOTE — ED Notes (Signed)
ED TO INPATIENT HANDOFF REPORT  ED Nurse Name and Phone #: Clista Bernhardt Name/Age/Gender Matthew [name] 47 y.o. male Room/Bed: 016C/016C  Code Status   Code Status: Full Code  Home/SNF/Other Home Patient oriented to: self and place Is this baseline? No   Triage Complete: Triage complete  Chief Complaint AMS (altered mental status) [R41.82]  Triage Note Pt BIB EMS. Per EMS, pt called his father @ 0400 saying that he did not feel right and took five 1mg  xanax at that time. Pt's father found pt on the floor of apartment hallway with urinary incontinence and AMS. Pt currently A/Ox3, but very lethargic. Exhibits slurred speech and labored breathing.    Allergies Allergies  Allergen Reactions   Gluten Meal Other (See Comments)    Celiac disease   Lactose Intolerance (Gi) Diarrhea    Can tolerate hard cheese    Level of Care/Admitting Diagnosis ED Disposition     ED Disposition  Admit   Condition  --   Comment  [facility] [[id number]]  Level of Care: Telemetry Medical [[id number]]  May place patient in observation at [facility] or [facility] if equivalent level of care is available:: No  Covid Evaluation: Asymptomatic - no recent exposure (last 10 days) testing not required  Diagnosis: AMS (altered mental status) [id number]  Admitting Physician: [[id number]]  Attending Physician: Ivery Quale [[id number]]          B Medical/Surgery History Past Medical History:  Diagnosis Date   Anxiety    Arthritis    Celiac disease    Coronary artery disease    Dairy allergy    Depression    Diabetes mellitus without complication (HCC)    High cholesterol    Hypertension    Hypothyroidism    Myocardial infarction (HCC)    Paranoia (HCC)    Schizoaffective disorder (HCC)    Past Surgical History:  Procedure Laterality Date   APPENDECTOMY     CARDIAC CATHETERIZATION     CORONARY STENT INTERVENTION N/A 06/06/2020    Procedure: CORONARY STENT INTERVENTION;  Surgeon: 06/08/2020, Peter M, MD;  [facility]: [facility] INVASIVE CV LAB;  Service: Cardiovascular;  Laterality: N/A;  prox LAD, distal RCA   INTRAVASCULAR ULTRASOUND/IVUS N/A 06/06/2020   Procedure: Intravascular Ultrasound/IVUS;  Surgeon: 06/08/2020, Peter M, MD;  [facility] CV LAB;  Service: Cardiovascular;  Laterality: N/A;   LEFT HEART CATH AND CORONARY ANGIOGRAPHY N/A 06/06/2020   Procedure: LEFT HEART CATH AND CORONARY ANGIOGRAPHY;  Surgeon: 06/08/2020, Peter M, MD;  Location: [facility] INVASIVE CV LAB;  Service: Cardiovascular;  Laterality: N/A;   LEFT HEART CATH AND CORONARY ANGIOGRAPHY N/A 02/24/2021   Procedure: LEFT HEART CATH AND CORONARY ANGIOGRAPHY;  Surgeon: 04/26/2021, MD;  Location: [facility] INVASIVE CV LAB;  Service: Cardiovascular;  Laterality: N/A;   NASAL SEPTUM SURGERY     TOTAL SHOULDER ARTHROPLASTY Right 11/28/2021   Procedure: TOTAL SHOULDER ARTHROPLASTY;  Surgeon: 01/29/2022, MD;  Location: [facility] ORS;  Service: Orthopedics;  Laterality: Right;   WRIST FRACTURE SURGERY     right wrist     A IV Location/Drains/Wounds Patient Lines/Drains/Airways Status     Active Line/Drains/Airways     Name Placement date Placement time Site Days   Peripheral IV 06/01/22 18 G Left Antecubital 06/01/22  0635  Antecubital  less than 1   Peripheral IV 06/01/22 20 G Anterior;Distal;Right Forearm 06/01/22  0822  Forearm  less than 1  Incision (Closed) 11/28/21 Shoulder Right 11/28/21  1659  -- 185            Intake/Output Last 24 hours  Intake/Output Summary (Last 24 hours) at 06/01/2022 1414 Last data filed at 06/01/2022 0826 Gross per 24 hour  Intake 2000 ml  Output 800 ml  Net 1200 ml    Labs/Imaging Results for orders placed or performed during the hospital encounter of 06/01/22 (from the past 48 hour(s))  CBC with Differential     Status: Abnormal   Collection Time: 06/01/22  6:47 AM  Result Value Ref Range   WBC 14.9 (H) 4.0 - 10.5 K/uL    RBC 5.21 4.22 - 5.81 MIL/uL   Hemoglobin 14.9 13.0 - 17.0 g/dL   HCT 43.6 39.0 - 52.0 %   MCV 83.7 80.0 - 100.0 fL   MCH 28.6 26.0 - 34.0 pg   MCHC 34.2 30.0 - 36.0 g/dL   RDW 15.1 11.5 - 15.5 %   Platelets 248 150 - 400 K/uL   nRBC 0.0 0.0 - 0.2 %   Neutrophils Relative % 72 %   Neutro Abs 10.8 (H) 1.7 - 7.7 K/uL   Lymphocytes Relative 15 %   Lymphs Abs 2.2 0.7 - 4.0 K/uL   Monocytes Relative 6 %   Monocytes Absolute 0.9 0.1 - 1.0 K/uL   Eosinophils Relative 5 %   Eosinophils Absolute 0.7 (H) 0.0 - 0.5 K/uL   Basophils Relative 1 %   Basophils Absolute 0.2 (H) 0.0 - 0.1 K/uL   Immature Granulocytes 1 %   Abs Immature Granulocytes 0.11 (H) 0.00 - 0.07 K/uL    Comment: Performed at [facility], [address] [place], [place] [address]  Comprehensive metabolic panel     Status: Abnormal   Collection Time: 06/01/22  6:47 AM  Result Value Ref Range   Sodium 134 (L) 135 - 145 mmol/L   Potassium 3.8 3.5 - 5.1 mmol/L   Chloride 101 98 - 111 mmol/L   CO2 21 (L) 22 - 32 mmol/L   Glucose, Bld 308 (H) 70 - 99 mg/dL    Comment: Glucose reference range applies only to samples taken after fasting for at least 8 hours.   BUN 17 6 - 20 mg/dL   Creatinine, Ser 1.27 (H) 0.61 - 1.24 mg/dL   Calcium 9.1 8.9 - 10.3 mg/dL   Total Protein 6.8 6.5 - 8.1 g/dL   Albumin 4.1 3.5 - 5.0 g/dL   AST 30 15 - 41 U/L   ALT 37 0 - 44 U/L   Alkaline Phosphatase 77 38 - 126 U/L   Total Bilirubin 0.5 0.3 - 1.2 mg/dL   GFR, Estimated >60 >60 mL/min    Comment: (NOTE) Calculated using the CKD-EPI Creatinine Equation (2021)    Anion gap 12 5 - 15    Comment: Performed at [facility] [address], [place], [place] [address]  Ethanol     Status: None   Collection Time: 06/01/22  6:47 AM  Result Value Ref Range   Alcohol, Ethyl (B) <10 <10 mg/dL    Comment: (NOTE) Lowest detectable limit for serum alcohol is 10 mg/dL.  For medical purposes only. Performed at [facility], [place] [address] [place], [place] [address]   Acetaminophen level     Status: Abnormal   Collection Time: 06/01/22  6:47 AM  Result Value Ref Range   Acetaminophen (Tylenol), Serum <10 (L) 10 - 30 ug/mL  Comment: (NOTE) Therapeutic concentrations vary significantly. A range of 10-30 ug/mL  may be an effective concentration for many patients. However, some  are best treated at concentrations outside of this range. Acetaminophen concentrations >150 ug/mL at 4 hours after ingestion  and >50 ug/mL at 12 hours after ingestion are often associated with  toxic reactions.  Performed at [facility], [place] [address], [place], [place] [address]   Salicylate level     Status: Abnormal   Collection Time: 06/01/22  6:47 AM  Result Value Ref Range   Salicylate Lvl <6.0 (L) 7.0 - 30.0 mg/dL    Comment: Performed at [facility] [address], [place], [place] [address]  I-stat chem 8, ED (not at [facility], [facility] or [facility])     Status: Abnormal   Collection Time: 06/01/22  6:54 AM  Result Value Ref Range   Sodium 136 135 - 145 mmol/L   Potassium 3.7 3.5 - 5.1 mmol/L   Chloride 103 98 - 111 mmol/L   BUN 18 6 - 20 mg/dL   Creatinine, Ser 1.20 0.61 - 1.24 mg/dL   Glucose, Bld 323 (H) 70 - 99 mg/dL    Comment: Glucose reference range applies only to samples taken after fasting for at least 8 hours.   Calcium, Ion 1.08 (L) 1.15 - 1.40 mmol/L   TCO2 21 (L) 22 - 32 mmol/L   Hemoglobin 15.3 13.0 - 17.0 g/dL   HCT 45.0 39.0 - 52.0 %  CBG monitoring, ED     Status: Abnormal   Collection Time: 06/01/22  7:32 AM  Result Value Ref Range   Glucose-Capillary 279 (H) 70 - 99 mg/dL    Comment: Glucose reference range applies only to samples taken after fasting for at least 8 hours.  I-Stat arterial blood gas, ED     Status: Abnormal   Collection Time: 06/01/22  8:02 AM  Result Value Ref Range   pH, Arterial 7.298 (L) 7.35 - 7.45   pCO2 arterial 47.6 32 - 48 mmHg   pO2, Arterial 78 (L) 83 - 108 mmHg    Bicarbonate 23.3 20.0 - 28.0 mmol/L   TCO2 25 22 - 32 mmol/L   O2 Saturation 94 %   Acid-base deficit 4.0 (H) 0.0 - 2.0 mmol/L   Sodium 139 135 - 145 mmol/L   Potassium 3.6 3.5 - 5.1 mmol/L   Calcium, Ion 1.27 1.15 - 1.40 mmol/L   HCT 42.0 39.0 - 52.0 %   Hemoglobin 14.3 13.0 - 17.0 g/dL   Collection site RADIAL, ALLEN'S TEST ACCEPTABLE    Drawn by RT    Sample type ARTERIAL   Rapid urine drug screen (hospital performed)     Status: Abnormal   Collection Time: 06/01/22  8:09 AM  Result Value Ref Range   Opiates NONE DETECTED NONE DETECTED   Cocaine NONE DETECTED NONE DETECTED   Benzodiazepines POSITIVE (A) NONE DETECTED   Amphetamines NONE DETECTED NONE DETECTED   Tetrahydrocannabinol NONE DETECTED NONE DETECTED   Barbiturates NONE DETECTED NONE DETECTED    Comment: (NOTE) DRUG SCREEN FOR MEDICAL PURPOSES ONLY.  IF CONFIRMATION IS NEEDED FOR ANY PURPOSE, NOTIFY LAB WITHIN 5 DAYS.  LOWEST DETECTABLE LIMITS FOR URINE DRUG SCREEN Drug Class                     Cutoff (ng/mL) Amphetamine and metabolites    1000 Barbiturate and metabolites    200 Benzodiazepine  200 Opiates and metabolites        300 Cocaine and metabolites        300 THC                            50 Performed at [facility], [address], [place], [place] [address]   Lithium level     Status: Abnormal   Collection Time: 06/01/22  8:45 AM  Result Value Ref Range   Lithium Lvl 0.39 (L) 0.60 - 1.20 mmol/L    Comment: Performed at [facility], [address], [place], [place] [address]  I-Stat arterial blood gas, ED     Status: None   Collection Time: 06/01/22 10:20 AM  Result Value Ref Range   pH, Arterial 7.388 7.35 - 7.45   pCO2 arterial 38.6 32 - 48 mmHg   pO2, Arterial 87 83 - 108 mmHg   Bicarbonate 23.3 20.0 - 28.0 mmol/L   TCO2 24 22 - 32 mmol/L   O2 Saturation 97 %   Acid-base deficit 2.0 0.0 - 2.0 mmol/L   Sodium 141 135 - 145 mmol/L   Potassium 3.6 3.5 - 5.1  mmol/L   Calcium, Ion 1.21 1.15 - 1.40 mmol/L   HCT 42.0 39.0 - 52.0 %   Hemoglobin 14.3 13.0 - 17.0 g/dL   Collection site RADIAL, ALLEN'S TEST ACCEPTABLE    Drawn by RT    Sample type ARTERIAL    CT Head Wo Contrast  Result Date: 06/01/2022 CLINICAL DATA:  47 year old male with altered mental status, found down. EXAM: CT HEAD WITHOUT CONTRAST TECHNIQUE: Contiguous axial images were obtained from the base of the skull through the vertex without intravenous contrast. RADIATION DOSE REDUCTION: This exam was performed according to the departmental dose-optimization program which includes automated exposure control, adjustment of the mA and/or kV according to patient size and/or use of iterative reconstruction technique. COMPARISON:  Head CT 04/23/2021. FINDINGS: Brain: Small chronic left posterior fossa extra-axial CSF collection on series 3, image 7 is unchanged since and earlier 2016 and appears inconsequential (most likely a small arachnoid cyst, normal variant). No midline shift, ventriculomegaly, mass effect, evidence of other mass lesion, intracranial hemorrhage or evidence of cortically based acute infarction. Gray-white matter differentiation is within normal limits throughout the brain. Stable cerebral volume. Vascular: No suspicious intracranial vascular hyperdensity. Skull: No acute osseous abnormality identified. Sinuses/Orbits: Visualized paranasal sinuses and mastoids are clear. Other: Visualized orbits and scalp soft tissues are within normal limits. IMPRESSION: No acute traumatic injury identified. Stable and negative noncontrast CT appearance of the brain. Electronically Signed   By: Odessa Fleming M.D.   On: 06/01/2022 12:18   DG Chest Port 1 View  Result Date: 06/01/2022 CLINICAL DATA:  Altered mental status. EXAM: PORTABLE CHEST 1 VIEW COMPARISON:  [place] at 05/23/21 FINDINGS: There is a low inspiration on exam. There is mild cardiomegaly without evidence of CHF. The mediastinum is normally  outlined. There is bronchovascular crowding in the bases due to low inspiration. No focal pneumonia is seen. There is a chronically elevated right diaphragm. The sulci are sharp, as far as seen with the left lateral sulcus not filmed. Thoracic cage is intact. IMPRESSION: Low inspiration with bronchovascular crowding in the bases. No evidence of acute chest disease. Mild cardiomegaly. Chronically elevated right diaphragm. Electronically Signed   By: Almira Bar M.D.   On: 06/01/2022 07:37    Pending Labs [employer] (  From admission, onward)     Start     Ordered   06/02/22 0500  Basic metabolic panel  Tomorrow morning,   R        06/01/22 1359   06/01/22 1346  HIV Antibody (routine testing w rflx)  (HIV Antibody (Routine testing w reflex) panel)  Once,   R        06/01/22 1359   06/01/22 1304  Lactic acid, plasma  ONCE - STAT,   STAT        06/01/22 1303   06/01/22 1246  Resp panel by RT-PCR (RSV, Flu A&B, Covid) Anterior Nasal Swab  (Resp panel by RT-PCR (RSV, Flu A&B, Covid))  Once,   URGENT        06/01/22 1245            Vitals/Pain Today's Vitals   06/01/22 1100 06/01/22 1130 06/01/22 1225 06/01/22 1241  BP: 117/80 (!) 124/99  131/85  Pulse: (!) 106 (!) 101  (!) 108  Resp: (!) 25   (!) 25  Temp:   98.4 F (36.9 C)   TempSrc:   Oral   SpO2: 93% 94%  95%  PainSc:        Isolation Precautions Airborne and Contact precautions  Medications Medications  enoxaparin (LOVENOX) injection 40 mg (has no administration in time range)  0.9 %  sodium chloride infusion (has no administration in time range)  sodium chloride 0.9 % bolus 2,000 mL (0 mLs Intravenous Stopped 06/01/22 0826)  naloxone (NARCAN) injection 1 mg (1 mg Intravenous Given 06/01/22 0650)    Mobility Unable to safely ambulate at this time; normally is ambulatory to self High fall risk   Focused Assessments Neuro Assessment Handoff:  Swallow screen pass? No  Cardiac Rhythm: Normal sinus rhythm        Neuro Assessment: Exceptions to [facility] Neuro Checks:      Has TPA been given? No If patient is a Neuro Trauma and patient is going to OR before floor call report to 4N Charge nurse: [phone] or [phone]  ,    R Recommendations: See Admitting Provider Note  Report given to:   Additional Notes: will need re-eval'd for safety of PO intake; has failed two previous attempts but is still not back to mentation baseline

## 2022-06-01 NOTE — TOC Progression Note (Signed)
Transition of Care [facility]) - Initial/Assessment Note    Patient Details  Name: Matthew [name] MRN: [id number] Date of Birth: [birth year]  Transition of Care [facility]) CM/SW Contact:    Milinda Antis, [place] Phone Number: 06/01/2022, 3:39 PM  Clinical Narrative:                 Transition of Care Department [facility]) has reviewed patient. Patient is from home and admitted for Altered Mental Status and Benzodiazepine (tranquilizer) overdose.  We will continue to monitor patient advancement through interdisciplinary progression rounds.    Substance use resources placed on the patient's AVS.    Patient Goals and CMS Choice            Expected Discharge Plan and Services                                              Prior Living Arrangements/Services                       Activities of Daily Living      Permission Sought/Granted                  Emotional Assessment              Admission diagnosis:  AMS (altered mental status) [R41.82] Patient Active Problem List   Diagnosis Date Noted   AMS (altered mental status) 06/01/2022   Benzodiazepine (tranquilizer) overdose 06/01/2022   Polyneuropathy associated with underlying disease ([place]) 03/20/2022   Type 2 diabetes mellitus with diabetic polyneuropathy, with long-term current use of insulin ([place]) 03/20/2022   S/P shoulder replacement, right 11/28/2021   Atypical chest pain 04/23/2021   Syncope 04/23/2021   Chronic hyponatremia 04/23/2021   Leukocytosis 04/23/2021   Right rib fracture 04/23/2021   High cholesterol    Tobacco abuse    Unstable angina ([place]) 02/24/2021   Right-sided chest pain 09/11/2020   Coronary artery disease of bypass graft of native heart with stable angina pectoris ([place])    Uncontrolled type 2 diabetes mellitus with hyperglycemia ([place])    Hyponatremia    Essential hypertension    History of hyperprolactinemia 08/16/2020   Mixed dyslipidemia 08/15/2020   Chest pain  06/06/2020   Type 2 diabetes mellitus with hyperglycemia ([place]) 06/06/2020   Hyperkalemia 06/06/2020   Thrombocytopenia ([place]) 06/06/2020   Obesity (BMI 30.0-34.9) 06/06/2020   Non-ST elevation (NSTEMI) myocardial infarction [facility])    Attention deficit hyperactivity disorder (ADHD), combined type, moderate 03/10/2018   Generalized anxiety disorder 03/10/2018   Schizoaffective disorder, bipolar type without good prognostic features ([place]) 02/09/2015   Morbid obesity ([place]) 01/18/2015   OSA (obstructive sleep apnea) 07/14/2014   Solitary pulmonary nodule 07/14/2014   PCP:  [name], MD Pharmacy:   [id number] - [place], [place] [address] [place] [address] Phone: [phone] Fax: [phone], [place] [address] [place] [address] Phone: [phone] Fax: [phone]  [id number] [place] [address] [place] [address] Phone: [phone] Fax: [phone]     Social Determinants of Health (SDOH) Social History: SDOH Screenings   Alcohol Screen: Low Risk  (10/21/2018)  Depression (PHQ2-9): Low Risk  (08/16/2020)  Tobacco Use: High Risk (06/01/2022)   SDOH Interventions:     Readmission Risk Interventions     No data to display

## 2022-06-01 NOTE — Hospital Course (Addendum)
x5 1 mg Xanax at 0400 this morning  Psych consult   Still needing O2  Head CT normal  CXR normal  Poison control said it should be out of his system within a few hours

## 2022-06-02 DIAGNOSIS — T424X2A Poisoning by benzodiazepines, intentional self-harm, initial encounter: Secondary | ICD-10-CM | POA: Diagnosis not present

## 2022-06-02 DIAGNOSIS — R4182 Altered mental status, unspecified: Secondary | ICD-10-CM

## 2022-06-02 DIAGNOSIS — T424X1A Poisoning by benzodiazepines, accidental (unintentional), initial encounter: Secondary | ICD-10-CM | POA: Diagnosis not present

## 2022-06-02 LAB — BASIC METABOLIC PANEL
Anion gap: 9 (ref 5–15)
BUN: 10 mg/dL (ref 6–20)
CO2: 22 mmol/L (ref 22–32)
Calcium: 8.4 mg/dL — ABNORMAL LOW (ref 8.9–10.3)
Chloride: 105 mmol/L (ref 98–111)
Creatinine, Ser: 1.02 mg/dL (ref 0.61–1.24)
GFR, Estimated: >60 mL/min (ref >60)
Glucose, Bld: 147 mg/dL — ABNORMAL HIGH (ref 70–99)
Potassium: 3.1 mmol/L — ABNORMAL LOW (ref 3.5–5.1)
Sodium: 136 mmol/L (ref 135–145)

## 2022-06-02 LAB — GLUCOSE, CAPILLARY
Glucose-Capillary: 148 mg/dL — ABNORMAL HIGH (ref 70–99)
Glucose-Capillary: 152 mg/dL — ABNORMAL HIGH (ref 70–99)
Glucose-Capillary: 190 mg/dL — ABNORMAL HIGH (ref 70–99)

## 2022-06-02 MED ORDER — INSULIN GLARGINE-YFGN 100 UNIT/ML ~~LOC~~ SOLN
10.0000 [IU] | Freq: Every day | SUBCUTANEOUS | Status: DC
  Filled 2022-06-02: qty 0.1

## 2022-06-02 MED ORDER — DEUTETRABENAZINE ER 24 MG PO TB24: 24.0000 mg | ORAL_TABLET | Freq: Every day | ORAL | Status: DC

## 2022-06-02 MED ORDER — ALPRAZOLAM 0.25 MG PO TABS: 0.2500 mg | ORAL_TABLET | Freq: Two times a day (BID) | ORAL | Status: DC | PRN

## 2022-06-02 MED ORDER — TRAZODONE HCL 50 MG PO TABS
300.0000 mg | ORAL_TABLET | Freq: Every day | ORAL | Status: DC
  Administered 2022-06-02: 300 mg via ORAL
  Filled 2022-06-02: qty 6

## 2022-06-02 MED ORDER — PREGABALIN 100 MG PO CAPS
100.0000 mg | ORAL_CAPSULE | Freq: Two times a day (BID) | ORAL | Status: DC
  Administered 2022-06-02: 100 mg via ORAL
  Filled 2022-06-02: qty 1

## 2022-06-02 MED ORDER — LITHIUM CARBONATE ER 300 MG PO TBCR: 600.0000 mg | EXTENDED_RELEASE_TABLET | Freq: Every day | ORAL | 0 refills | Status: DC

## 2022-06-02 MED ORDER — NICOTINE 14 MG/24HR TD PT24: 14.0000 mg | MEDICATED_PATCH | Freq: Every day | TRANSDERMAL | 0 refills | Status: DC

## 2022-06-02 MED ORDER — ATOMOXETINE HCL 40 MG PO CAPS
80.0000 mg | ORAL_CAPSULE | Freq: Every day | ORAL | Status: DC
  Administered 2022-06-02: 80 mg via ORAL
  Filled 2022-06-02: qty 2

## 2022-06-02 MED ORDER — ALPRAZOLAM 1 MG PO TABS: 1.0000 mg | ORAL_TABLET | Freq: Two times a day (BID) | ORAL | 0 refills | Status: DC | PRN

## 2022-06-02 MED ORDER — LEVOTHYROXINE SODIUM 25 MCG PO TABS
25.0000 ug | ORAL_TABLET | Freq: Every day | ORAL | Status: DC
  Administered 2022-06-02: 25 ug via ORAL
  Filled 2022-06-02: qty 1

## 2022-06-02 MED ORDER — PERPHENAZINE 4 MG PO TABS
16.0000 mg | ORAL_TABLET | Freq: Every day | ORAL | Status: DC
  Administered 2022-06-02: 16 mg via ORAL
  Filled 2022-06-02: qty 4
  Filled 2022-06-02: qty 1

## 2022-06-02 MED ORDER — TRAZODONE HCL 50 MG PO TABS: 300.0000 mg | ORAL_TABLET | Freq: Every day | ORAL | Status: DC

## 2022-06-02 MED ORDER — ACETAMINOPHEN 325 MG PO TABS
650.0000 mg | ORAL_TABLET | Freq: Four times a day (QID) | ORAL | Status: DC | PRN
  Administered 2022-06-02: 650 mg via ORAL
  Filled 2022-06-02: qty 2

## 2022-06-02 MED ORDER — PERPHENAZINE 4 MG PO TABS
32.0000 mg | ORAL_TABLET | Freq: Every day | ORAL | Status: DC
  Filled 2022-06-02: qty 8

## 2022-06-02 MED ORDER — POTASSIUM CHLORIDE 20 MEQ PO PACK
40.0000 meq | PACK | Freq: Once | ORAL | Status: AC
  Administered 2022-06-02: 40 meq via ORAL
  Filled 2022-06-02: qty 2

## 2022-06-02 MED ORDER — DULOXETINE HCL 60 MG PO CPEP
90.0000 mg | ORAL_CAPSULE | Freq: Every day | ORAL | Status: DC
  Administered 2022-06-02: 90 mg via ORAL
  Filled 2022-06-02: qty 1

## 2022-06-02 NOTE — Consult Note (Addendum)
[facility]ensed Psychiatry New Face-to-Face Psychiatric Evaluation   Service Date: June 02, 2022 LOS:  LOS: 0 days    Assessment  Matthew [name] is a 47 y.o. male admitted medically for 06/01/2022  6:32 AM for a Xanax overdose of undetermined intent. He carries the psychiatric diagnoses of schizoaffective disorder (bipolar type), anxiety, depression, ADD and insomnia and has a past medical history of  obesity, arthritis, celiac disease, CAD with MI, DMII, high cholesterol, and hypothyroidism. Psychiatry was consulted for said overdose by Dr. Ronnald Ramp.    His current presentation of a fall after overdosing on his Xanax is most consistent with polypharmacy; he has an extensive psych home regimen and it seems was unable to tolerate an extra pill of Xanax. He has been consistent in denying suicidality since admission, he had an outpt behavioral health visit 2 days before admission and was at or near baseline, and his father has minimal concern that this was a suicide attempt.  He has a history of SPMI (on disability, in assisted housing) and his current medication regimen, while extensive, has provided significant relief from his symptoms and was crafted over several years. He reports compliance with current medication regimen. He meets criteria for schizoaffective disorder and GAD at minimum; did not fully explore known dx of ADD given urgent nature of evaluation. He does still occasionally experience auditory hallucinations, but these are brief and easily reality tested. I do not think it is appropriate to make drastic changes to his medication regimen at this time, especially without knowing his full history. He is agreeable to curtailing Xanax to 1 BID (would also be OK with 0.5 TID) and leaving lithium at 600 QHS at present and with messaging his outpt psychiatric provider Nada Libman, PA-C).    Diagnoses:  Active Hospital problems: Principal Problem:   Benzodiazepine (tranquilizer)  overdose Active Problems:   OSA (obstructive sleep apnea)   Schizoaffective disorder, bipolar type without good prognostic features (HCC)   Attention deficit hyperactivity disorder (ADHD), combined type, moderate   Generalized anxiety disorder   Mixed dyslipidemia   Essential hypertension   Tobacco abuse   Type 2 diabetes mellitus with diabetic polyneuropathy, with long-term current use of insulin (HCC)   AMS (altered mental status)   Insomnia   Elevated serum creatinine     Plan  ## Safety and Observation Level:  - Based on my clinical evaluation, I estimate the patient to be at low risk of self harm in the current setting - At this time, we recommend a routine level of observation. This decision is based on my review of the chart including patient's history and current presentation, interview of the patient, mental status examination, and consideration of suicide risk including evaluating suicidal ideation, plan, intent, suicidal or self-harm behaviors, risk factors, and protective factors. This judgment is based on our ability to directly address suicide risk, implement suicide prevention strategies and develop a safety plan while the patient is in the clinical setting. Please contact our team if there is a concern that risk level has changed.   ## Medications:  -- continue straterra 80, austeudo 24 mg, cymbalta 90 mg, perphenazine 16 qAM/32 qPM, lyrica 100 BID, trazodone 300 QHS, sonata 10 PRN.   -- CHANGE  - lithium from 600 BID to 600 QHS   - if able to tolerate, would consider 1200 QHS over 600 BID (more renally protective)  - Xanax from 1 TID to 1 BID   - feel 0.25 BID as currently  ordered will lead to rebound anxiety   ## Medical Decision Making Capacity:  Not formally assessed  ## Further Work-up:  -- None currently -- as outpt: CPAP   -- most recent EKG on 1/12 had QtC in 450s -- Pertinent labwork reviewed earlier this admission includes:   Lithium level 0.39  notably 0.6 on recent outpt check             UDS + BZD (expected)             Tylenol, ethanol, salicyclates undetectable   Recent outpt TSH OK  ## Disposition:  -- per primary  Thank you for this consult request. Recommendations have been communicated to the primary team.  We will sign off in anticipation of impending dc at this time.   [place] A Shafer Swamy   New history  Relevant Aspects of Hospital Course:  Admitted on 06/01/2022 for AMS after a Xanax OD. Unable to be seen on 1/12 d/t somnolenece. Has told people he took the Xanax for "fun" vs due to anxiety   Patient Report:  Pt seen in AM. Accurately recounts events leading to hospitalization. He doesn't remember much of what happened, and is mostly repeating what he has been told. He remembers taking an extra Xanax due to anxiety "it's not like I took it out of pure joy, I was having an anxiety attack". Although it is written TID PRN, takes 2 mg most days, so this was 100% increase of daily dose. He is future oriented throughout interview although fairly concrete (see PSE). One of his main concerns (aside from anxiety) is fatigue and concentration; discussed need for new CPAP at length.   Psych ROS Depression: Very poor sleep, trazodone buys about 4 hours of poor quality sleep, fatigued the next day No anhedonia, Some worthlessness/no guilt Decrased energy, concentration (worse since Adderall cut off after MI) No change in appetite   Psychosis Chronic auditory hallucinations, better on current meds than prev Prior hx of thought insertion, broadcasting but not in several years  Mania Some periods of being up 3 days without sleep remotely  Anxiety Significant generalized anxiety (chronic ruminative thoughts) Occasional panic attacks Depersonalization  Collateral information:  Unable to obtain prior to dc; appreciate efforts of FM in this direction  Psychiatric History:  Information collected from pt, medical  record  Family psych history:  mom - dementia Dad - depression PGM - some psychotic spectrum disorder   Social History:  Lives in [place]  Tobacco use: yes, socially (main source of interaction in group home) Alcohol use: no Drug use: no  Family History:  The patient's family history includes Asthma in his mother; Hypertension in his father and mother.  Medical History: Past Medical History:  Diagnosis Date   Anxiety    Arthritis    Celiac disease    Coronary artery disease    Dairy allergy    Depression    Diabetes mellitus without complication (HCC)    High cholesterol    Hypertension    Hypothyroidism    Myocardial infarction ([place])    Paranoia ([place])    Schizoaffective disorder ([name])     Surgical History: Past Surgical History:  Procedure Laterality Date   APPENDECTOMY     CARDIAC CATHETERIZATION     CORONARY STENT INTERVENTION N/A 06/06/2020   Procedure: CORONARY STENT INTERVENTION;  Surgeon: Martinique, Peter M, MD;  Location: [facility] CV LAB;  Service: Cardiovascular;  Laterality: N/A;  prox LAD, distal RCA   INTRAVASCULAR  ULTRASOUND/IVUS N/A 06/06/2020   Procedure: Intravascular Ultrasound/IVUS;  Surgeon: Swaziland, Peter M, MD;  Location: [facility] INVASIVE CV LAB;  Service: Cardiovascular;  Laterality: N/A;   LEFT HEART CATH AND CORONARY ANGIOGRAPHY N/A 06/06/2020   Procedure: LEFT HEART CATH AND CORONARY ANGIOGRAPHY;  Surgeon: Swaziland, Peter M, MD;  Location: [facility] INVASIVE CV LAB;  Service: Cardiovascular;  Laterality: N/A;   LEFT HEART CATH AND CORONARY ANGIOGRAPHY N/A 02/24/2021   Procedure: LEFT HEART CATH AND CORONARY ANGIOGRAPHY;  Surgeon: Lyn Records, MD;  Location: [facility] INVASIVE CV LAB;  Service: Cardiovascular;  Laterality: N/A;   NASAL SEPTUM SURGERY     TOTAL SHOULDER ARTHROPLASTY Right 11/28/2021   Procedure: TOTAL SHOULDER ARTHROPLASTY;  Surgeon: Teryl Lucy, MD;  Location: [facility] ORS;  Service: Orthopedics;  Laterality: Right;   WRIST FRACTURE SURGERY      right wrist    Medications:   Current Facility-Administered Medications:    acetaminophen (TYLENOL) tablet 650 mg, 650 mg, Oral, Q6H PRN, Littie Deeds, MD, 650 mg at 06/02/22 0152   ALPRAZolam Prudy Feeler) tablet 0.25 mg, 0.25 mg, Oral, BID PRN, Littie Deeds, MD   atomoxetine (STRATTERA) capsule 80 mg, 80 mg, Oral, Daily, Littie Deeds, MD, 80 mg at 06/02/22 7846   Deutetrabenazine ER TB24 24 mg, 24 mg, Oral, Daily, Littie Deeds, MD   DULoxetine (CYMBALTA) DR capsule 90 mg, 90 mg, Oral, Daily, Littie Deeds, MD, 90 mg at 06/02/22 0839   enoxaparin (LOVENOX) injection 40 mg, 40 mg, Subcutaneous, Q24H, Jones, Sarah, DO, 40 mg at 06/01/22 2144   insulin aspart (novoLOG) injection 0-9 Units, 0-9 Units, Subcutaneous, TID WC, Erick Alley, DO, 2 Units at 06/02/22 1422   insulin glargine-yfgn (SEMGLEE) injection 10 Units, 10 Units, Subcutaneous, QHS, Sowell, Apolinar Junes, MD   levothyroxine (SYNTHROID) tablet 25 mcg, 25 mcg, Oral, Q0600, Littie Deeds, MD, 25 mcg at 06/02/22 9629   nicotine (NICODERM CQ - dosed in mg/24 hours) patch 14 mg, 14 mg, Transdermal, Daily, Erick Alley, DO, 14 mg at 06/02/22 0840   perphenazine (TRILAFON) tablet 16 mg, 16 mg, Oral, Daily, Littie Deeds, MD, 16 mg at 06/02/22 5284   perphenazine (TRILAFON) tablet 32 mg, 32 mg, Oral, QHS, Littie Deeds, MD   pregabalin (LYRICA) capsule 100 mg, 100 mg, Oral, BID, Littie Deeds, MD, 100 mg at 06/02/22 1324   traZODone (DESYREL) tablet 300 mg, 300 mg, Oral, QHS, Chambliss, Estill Batten, MD, 300 mg at 06/02/22 0151  Current Outpatient Medications:    atomoxetine (STRATTERA) 80 MG capsule, Take 1 capsule (80 mg total) by mouth daily., Disp: 30 capsule, Rfl: 2   ALPRAZolam (XANAX) 1 MG tablet, Take 1 tablet (1 mg total) by mouth 2 (two) times daily as needed for anxiety., Disp: 1 tablet, Rfl: 0   aspirin EC 81 MG tablet, Take 1 tablet (81 mg total) by mouth daily. Swallow whole., Disp: 90 tablet, Rfl: 3   atorvastatin (LIPITOR) 80 MG tablet,  Take 1 tablet (80 mg total) by mouth every morning., Disp: 90 tablet, Rfl: 3   Boswellia-Glucosamine-Vit D (OSTEO BI-FLEX ONE PER DAY) TABS, Take 1 tablet by mouth daily., Disp: , Rfl:    carvedilol (COREG) 25 MG tablet, Take 1.5 tablets (37.5 mg total) by mouth 2 (two) times daily., Disp: 270 tablet, Rfl: 3   clopidogrel (PLAVIX) 75 MG tablet, Take 1 tablet (75 mg total) by mouth daily., Disp: 90 tablet, Rfl: 3   Coenzyme Q10 (CO Q-10 PO), Take 1 capsule by mouth daily., Disp: , Rfl:  Continuous Blood Gluc Sensor (DEXCOM G7 SENSOR) MISC, 1 Device by Does not apply route as directed., Disp: 9 each, Rfl: 3   Deutetrabenazine ER (AUSTEDO XR) 24 MG TB24, Take 24 mg by mouth daily., Disp: 30 tablet, Rfl: 11   DULoxetine (CYMBALTA) 30 MG capsule, Take 1 capsule (30 mg total) by mouth daily. Take 1 with the 60 mg=90 mg daily, Disp: 90 capsule, Rfl: 0   DULoxetine (CYMBALTA) 60 MG capsule, Take 1 capsule (60 mg total) by mouth daily. Take with 30 mg=90 mg daily., Disp: 90 capsule, Rfl: 0   empagliflozin (JARDIANCE) 25 MG TABS tablet, Take 1 tablet (25 mg total) by mouth daily before breakfast., Disp: 90 tablet, Rfl: 3   fenofibrate (TRICOR) 145 MG tablet, Take 1 tablet (145 mg total) by mouth daily., Disp: 90 tablet, Rfl: 3   icosapent Ethyl (VASCEPA) 1 g capsule, Take 2 capsules (2 g total) by mouth 2 (two) times daily., Disp: 360 capsule, Rfl: 3   insulin glargine (LANTUS SOLOSTAR) 100 UNIT/ML Solostar Pen, Inject 25 Units into the skin daily., Disp: 30 mL, Rfl: 3   Insulin Pen Needle 32G X 4 MM MISC, 1 Device by Does not apply route daily., Disp: 150 each, Rfl: 3   levothyroxine (SYNTHROID) 25 MCG tablet, TAKE 1 TABLET IN THE MORNING WITH A GLASS OF WATER, WAIT 1 HOUR BEFORE TAKING OTHER MEDS OR EATING, Disp: 90 tablet, Rfl: 0   lithium carbonate (LITHOBID) 300 MG ER tablet, Take 2 tablets (600 mg total) by mouth at bedtime., Disp: 60 tablet, Rfl: 0   metFORMIN (GLUCOPHAGE) 1000 MG tablet, Take 1  tablet (1,000 mg total) by mouth 2 (two) times daily with a meal., Disp: 180 tablet, Rfl: 3   [START ON 06/03/2022] nicotine (NICODERM CQ - DOSED IN MG/24 HOURS) 14 mg/24hr patch, Place 1 patch (14 mg total) onto the skin daily., Disp: 28 patch, Rfl: 0   nitroGLYCERIN (NITROSTAT) 0.4 MG SL tablet, Place 1 tablet (0.4 mg total) under the tongue every 5 (five) minutes as needed for chest pain., Disp: 100 tablet, Rfl: 3   perphenazine (TRILAFON) 16 MG tablet, TAKE 1 TABLET EVERY MORNING AND 2 TABLETS AT BEDTIME, Disp: 270 tablet, Rfl: 0   pregabalin (LYRICA) 100 MG capsule, Take 1 capsule (100 mg total) by mouth 2 (two) times daily., Disp: 60 capsule, Rfl: 1   Pseudoephedrine HCl (SUDAFED 12 HOUR PO), Take 1 tablet by mouth daily as needed (congestion)., Disp: , Rfl:    spironolactone (ALDACTONE) 25 MG tablet, Take 0.5 tablets (12.5 mg total) by mouth daily. (Patient not taking: Reported on 10/13/2021), Disp: 45 tablet, Rfl: 3   traZODone (DESYREL) 100 MG tablet, Take 3 tablets (300 mg total) by mouth at bedtime., Disp: 270 tablet, Rfl: 0   TURMERIC CURCUMIN PO, Take 1 capsule by mouth daily., Disp: , Rfl:    zaleplon (SONATA) 10 MG capsule, 1 po qhs prn and may repeat 1 for midnocturnal awakening, as long as he has 3 hours left to sleep., Disp: 60 capsule, Rfl: 1  Allergies: Allergies  Allergen Reactions   Gluten Meal Other (See Comments)    Celiac disease   Lactose Intolerance (Gi) Diarrhea    Can tolerate hard cheese       Objective  Vital signs:  Temp:  [97.5 F (36.4 C)-98.2 F (36.8 C)] 97.5 F (36.4 C) (01/13 0923) Pulse Rate:  [93-98] 93 (01/13 0923) Resp:  [18-19] 18 (01/13 0923) BP: (140-145)/(92-100) 145/92 (01/13 0923) SpO2:  [  94 %-97 %] 97 % (01/13 0923)  Psychiatric Specialty Exam:  Presentation  General Appearance: Appropriate for Environment  Eye Contact:Good  Speech:Clear and Coherent  Speech Volume:Normal  Handedness:No data recorded  Mood and Affect   Mood:-- (I'm fine today)  Affect:-- (A little blunted but appropriate)   Thought Process  Thought Processes:Coherent; Goal Directed; Linear (Concrete eg "apples and oranges are both round, trains and bicycles both have wheels")  Descriptions of Associations:Intact  Orientation:Full (Time, Place and Person)  Thought Content:-- (devoid of paranoia/delusions)  History of Schizophrenia/Schizoaffective disorder:No data recorded Duration of Psychotic Symptoms:No data recorded Hallucinations:Hallucinations: -- (None today. Sporadic AH, usually nonspecific content)  Ideas of Reference:None  Suicidal Thoughts:Suicidal Thoughts: No  Homicidal Thoughts:Homicidal Thoughts: No   Sensorium  Memory:Immediate Good; Recent Poor; Remote Good  Judgment:Poor  Insight:Fair   Executive Functions  Concentration:Fair  Attention Span:Fair  [place]   Psychomotor Activity  Psychomotor Activity:Psychomotor Activity: Normal (no abnormal orofacial movements noted)   Assets  Assets:Desire for Improvement; Resilience; Social Support   Sleep  Sleep:Sleep: Poor    Physical Exam: Physical Exam Constitutional:      Appearance: He is obese.  HENT:     Head: Normocephalic.  Eyes:     Conjunctiva/sclera: Conjunctivae normal.  Pulmonary:     Effort: Pulmonary effort is normal.  Neurological:     Mental Status: He is alert.     Blood pressure (!) 145/92, pulse 93, temperature (!) 97.5 F (36.4 C), temperature source Oral, resp. rate 18, SpO2 97 %. There is no height or weight on file to calculate BMI.

## 2022-06-02 NOTE — Progress Notes (Signed)
Family Medicine Teaching Service Attending Note  On 1/12 in the ER I interviewed and examined patient Power and reviewed their tests and x-rays.  I discussed with Dr. Ronnald Ramp and reviewed their note..  I agree with their assessment and plan.     Additionally  Somnolent but easily arousable and oriented in the ER Denies SI  Likely Benzo OD - supportive care and monitoring  Complex psychiatric history - consult Psychiatry for suggestion on medication management

## 2022-06-02 NOTE — Progress Notes (Signed)
Pt getting restless again and trying to leave AMA, unit charge nurse spoke with pt and messaged Dr Erin Hearing group.    At 1322  This RN messaged Dr Marcina Millard, asking him to come see pt again that he was trying to leave AMA again.   At 70  Dr Marcina Millard came up, spoke with pt, and currently pt agrees to stay til 3:00-3:30----MD to see if pysch MD did in fact come by or still coming by, no note in chart from psych.

## 2022-06-02 NOTE — Progress Notes (Signed)
FMTS Brief Progress Note  S:Received secure chat from patient nurse and charge nurse that patient wanting to leave. Went to speak with patient and he noted that he'd seen psych and PT/OT. He also was noting that his parents were in need of help at home. Secure chat w/ psych who noted patient ok for d/c, no concern of suicide.    O: BP (!) 145/92 (BP Location: Left Arm)   Pulse 93   Temp (!) 97.5 F (36.4 C) (Oral)   Resp 18   SpO2 97%   General: NAD, conversational, reasonable Mental: Baseline mental status  A/P: Benzodiazepine overdose Patient medically cleared and stable. PT/OT w/ no concerns in function/abillity and psych didn't believe this was a suicide attempt. Patient stable for d/c. Psych also recommending adjustment in Xanax dosing to 1 mg BID. - Xanax 1 mg BID - Will d/c today  Schizoaffective disorder/GAD/Depression/Insomnia Psych has made recommended adjustment to lithium level. -Lithium 600 QHS until seen by outpt provider   Holley Bouche, MD 06/02/2022, 1:47 PM PGY-2, [place] Night Resident  Please page [phone] with questions.

## 2022-06-02 NOTE — Progress Notes (Incomplete)
Daily Progress Note Intern Pager: [phone]  Patient name: Matthew [name] Medical record number: [id number] Date of birth: [birth year] Age: 47 y.o. Gender: male  Primary Care Provider: [name], MD Consultants: Psych Code Status: Full  Pt Overview and Major Events to Date:  1/12 - Admitted, poison control called  Assessment and Plan:  47 y.o. male presenting with AMS in setting of benzodiazepine overdose, with PMH significant for schizoaffective disorder, ADHD, GAD, depression, insomnia, T2DM, history of hyperprolactinemia, NSTEMI, CAD s/p stent, OSA, obesity  * Benzodiazepine (tranquilizer) overdose Improved mental status, patient AxO and restless about being in hospital overnight and threatened to leave AMA x 2, but was redirectable. Posin control recommended monitro for at least 4 hours and then based on symptoms. No past suicide attempts, but hx of SI 8 yrs ago per H&P. Psych consulted an to see today.  - Psychiatry following, appreciate recommendations  - Continue to monitor, gradually improving - Initially 4L O2 by [employer] now weaned to RA - continuous pulse ox and tele - tele sitter  Elevated serum creatinine Cr 1.27 on admission, improved to 1.01 today, baseline ~1.   - D/c IVF and encourage PO hydration - Daily BMP, continue to monitor  Type 2 diabetes mellitus with diabetic polyneuropathy, with long-term current use of insulin (HCC) A1c was 8.8 on 03/19/22. CBG range 148-279 yesterday, most recently in 150's. Patient restarted on diet overnight.  - Start home glargine 10 units daily, home dose 25 units - Hold Jardiance and metformin - Start pregabalin 100 mg BID - CBGs Q4H - sSSI  Insomnia Home med:  trazodone 300 mg and zaleplon 10 mg QHS.   - trazodone 300 mg qhs  Tobacco abuse Patient has smoked 2 ppd for 2 years, requests nicotine patch on admission.  Tobacco cessation counseling outpatient recommended. - Nicotine patch 14 mg daily  Schizoaffective  disorder, bipolar type without good prognostic features ([place]) No longer AMS. Home meds: perphenazine 16 mg AM and 32 mg PM and lithium carbonate 600 mg daily, deutetrabenazine 24 mg daily for TD.  Lithium level possibly subtherapeutic. - Psych following, appreciate recs - Restart home medications after psych consult   OSA (obstructive sleep apnea) Patient noncompliant with CPAP per chart review, no CPAP overnight. - Offer CPAP when patient back to baseline mental status   Chronic and stable - Hypothyroidism: Levothyroxine 25 mcg at home - HLD, CAD (Hx NSTEMI), HTN: Carvedilol 37.5 mg daily, Plavix 75 mg daily, spironolactone 12.5 mg daily, fenofibrate 145 mg daily (Restart home meds) - ADHD: Taking atomoxetine 80 mg at home - Depression: Duloxetine 90 mg daily  FEN/GI: Heart Healthy/carb modified PPx: lovenox Dispo:Home pending clinical improvement . Barriers include Pych eval.   Subjective:  ***  Objective: Temp:  [97.5 F (36.4 C)-98.9 F (37.2 C)] 97.5 F (36.4 C) (01/13 0923) Pulse Rate:  [93-98] 93 (01/13 0923) Resp:  [18-24] 18 (01/13 0923) BP: (124-145)/(89-100) 145/92 (01/13 0923) SpO2:  [93 %-97 %] 97 % (01/13 0923) Physical Exam: General: *** Cardiovascular: *** Respiratory: *** Abdomen: *** Extremities: ***  Laboratory: Most recent CBC Lab Results  Component Value Date   WBC 14.9 (H) 06/01/2022   HGB 14.3 06/01/2022   HCT 42.0 06/01/2022   MCV 83.7 06/01/2022   PLT 248 06/01/2022   Most recent BMP    Latest Ref Rng & Units 06/02/2022    2:44 AM  BMP  Glucose 70 - 99 mg/dL 147   BUN 6 - 20 mg/dL  10   Creatinine 0.61 - 1.24 mg/dL 1.02   Sodium 135 - 145 mmol/L 136   Potassium 3.5 - 5.1 mmol/L 3.1   Chloride 98 - 111 mmol/L 105   CO2 22 - 32 mmol/L 22   Calcium 8.9 - 10.3 mg/dL 8.4     Other pertinent labs ***   Imaging/Diagnostic Tests: Radiologist Impression: *** My interpretation: Holley Bouche, MD 06/02/2022, 1:14 PM  PGY-***,  [place] Intern pager: [phone], text pages welcome Secure chat group [place]

## 2022-06-02 NOTE — Progress Notes (Signed)
Dr Marcina Millard at bedside talking with pt.

## 2022-06-02 NOTE — Evaluation (Signed)
Physical Therapy Evaluation Patient Details Name: Matthew [name] MRN: [id number] DOB: 02-01-1976 Today's Date: 06/02/2022  History of Present Illness  Pt is a 47 y/o male presenting with AMS in setting of Xanax overdose. PMH includes: schizoaffective disorder, paranoia, depression, ADHD, insomnia, HTN, DM2, OSA on CPAP, CAD, NSTEMI 2022, R shoulder arthroplasty 2023.  Clinical Impression  Pt admitted with above diagnosis. Pt without significant deficits and should not need f/u or equipment. Pt did need some guard assist with steps and states dad will help him at home. Pt without LOB with ambulation in controlled environment and with min challenges to balance. Pt did have discrepancies in information communicated to OT vs PT. Agree with further cognitive evaluation and will follow acutely to ensure that pt continues to mobilize to decr weakness.  Pt currently with functional limitations due to the deficits listed below (see PT Problem List). Pt will benefit from skilled PT to increase their independence and safety with mobility to allow discharge to the venue listed below.          Recommendations for follow up therapy are one component of a multi-disciplinary discharge planning process, led by the attending physician.  Recommendations may be updated based on patient status, additional functional criteria and insurance authorization.  Follow Up Recommendations No PT follow up      Assistance Recommended at Discharge PRN  Patient can return home with the following  Help with stairs or ramp for entrance    Equipment Recommendations None recommended by PT  Recommendations for Other Services       Functional Status Assessment Patient has had a recent decline in their functional status and demonstrates the ability to make significant improvements in function in a reasonable and predictable amount of time.     Precautions / Restrictions Precautions Precautions: Fall Restrictions Weight  Bearing Restrictions: No      Mobility  Bed Mobility Overal bed mobility: Independent                  Transfers Overall transfer level: Independent                 General transfer comment: Pt stood to put on boxers and was able to do so without physical assist.    Ambulation/Gait Ambulation/Gait assistance: Supervision, Independent Gait Distance (Feet): 250 Feet Assistive device: None Gait Pattern/deviations: Step-through pattern, Decreased stride length   Gait velocity interpretation: 1.31 - 2.62 ft/sec, indicative of limited community ambulator   General Gait Details: Pt did not have LOB.  Does state burning in thighs with gait and espeically iwth Up and down steps.No LOB with min challenges.  Stairs Stairs: Yes Stairs assistance: Min guard Stair Management: No rails Number of Stairs: 3 General stair comments: Pt needed guard assist as he stated thighs burned with steps.  Wheelchair Mobility    Modified Rankin (Stroke Patients Only)       Balance Overall balance assessment: Mild deficits observed, not formally tested                               Standardized Balance Assessment Standardized Balance Assessment : Dynamic Gait Index   Dynamic Gait Index Level Surface: Normal Change in Gait Speed: Normal Gait with Horizontal Head Turns: Normal Gait with Vertical Head Turns: Normal Gait and Pivot Turn: Mild Impairment Step Over Obstacle: Mild Impairment Step Around Obstacles: Normal Steps: Mild Impairment Total Score: 21  Pertinent Vitals/Pain Pain Assessment Pain Assessment: Faces Faces Pain Scale: Hurts even more Pain Location: B thigh soreness Pain Descriptors / Indicators: Sore Pain Intervention(s): Limited activity within patient's tolerance, Monitored during session, Repositioned    Home Living Family/patient expects to be discharged to:: Private residence Living Arrangements: Alone Available Help at Discharge:  Available PRN/intermittently;Family Type of Home: Apartment Home Access: Level entry       Home Layout: One level Home Equipment: None Additional Comments: reported to this PT 1 STE    Prior Function Prior Level of Function : Independent/Modified Independent               ADLs Comments: does not work/drive, independent ADLs, IADLs. reports using uber/his dad to assist with transportation.  he manages meds using timers.     Hand Dominance   Dominant Hand: Right    Extremity/Trunk Assessment   Upper Extremity Assessment Upper Extremity Assessment: Defer to OT evaluation    Lower Extremity Assessment Lower Extremity Assessment: Overall WFL for tasks assessed    Cervical / Trunk Assessment Cervical / Trunk Assessment: Normal  Communication   Communication: No difficulties  Cognition Arousal/Alertness: Awake/alert Behavior During Therapy: Flat affect Overall Cognitive Status: No family/caregiver present to determine baseline cognitive functioning Area of Impairment: Problem solving                             Problem Solving: Difficulty sequencing, Slow processing General Comments: Likely baseline cognition. After speaking to [place] in assist at home and STE.  Further cognitive assessment may be warranted.        General Comments      Exercises     Assessment/Plan    PT Assessment Patient needs continued PT services  PT Problem List Decreased activity tolerance;Decreased balance;Decreased mobility;Decreased knowledge of use of DME;Decreased safety awareness;Decreased knowledge of precautions;Pain       PT Treatment Interventions DME instruction;Gait training;Stair training;Functional mobility training;Therapeutic activities;Therapeutic exercise;Balance training;Patient/family education    PT Goals (Current goals can be found in the Care Plan section)  Acute Rehab PT Goals Patient Stated Goal: to go home PT Goal Formulation: With  patient Time For Goal Achievement: 06/16/22 Potential to Achieve Goals: Good    Frequency Min 3X/week     Co-evaluation               AM-PAC PT "6 Clicks" Mobility  Outcome Measure Help needed turning from your back to your side while in a flat bed without using bedrails?: None Help needed moving from lying on your back to sitting on the side of a flat bed without using bedrails?: None Help needed moving to and from a bed to a chair (including a wheelchair)?: None Help needed standing up from a chair using your arms (e.g., wheelchair or bedside chair)?: None Help needed to walk in hospital room?: A Little Help needed climbing 3-5 steps with a railing? : A Little 6 Click Score: 22    End of Session Equipment Utilized During Treatment: Gait belt Activity Tolerance: Patient tolerated treatment well Patient left: in bed;with call bell/phone within reach;with bed alarm set Nurse Communication: Mobility status PT Visit Diagnosis: Muscle weakness (generalized) (M62.81)    Time: 5277-8242 PT Time Calculation (min) (ACUTE ONLY): 15 min   Charges:   PT Evaluation $PT Eval Low Complexity: 1 Low          Jemmie Rhinehart M,PT Acute Rehab Services [place]  06/02/2022, 11:22 AM

## 2022-06-02 NOTE — Progress Notes (Signed)
FMTS Interim Progress Note  Paged that pt wanted to leave AMA. Went to eval w/ Dr. Nancy Fetter. Pt feels super wound up and finding it difficult to stay in bed. He understands why he is on fall precautions. Reports adderall used to help his ADHD, but he can't have it anymore since his heart attack. Offered pt to have breakfast and Diet shasta to have something to do while in bed. Pt agreeable to staying. - Passed bedside swallow. Diet ordered.  Arlyce Dice, MD 06/02/2022, 6:28 AM PGY-1, [facility] pager [phone]

## 2022-06-02 NOTE — Progress Notes (Signed)
FMTS Interim Progress Note  S:Paged that pt was becoming agitated, aggressive, and wanting leave. Went to eval w/ Dr. Nancy Fetter. Pt was sitting in bed, said he felt "cabin fever" from being stuck in bed all day and wanted to get up and walk around. Per nursing, pt was still unsteady on his feet and pt was getting out of bed, ignoring telesitter. Redirected pt and explained that we want to keep him in bed to make sure Xanax was thoroughly cleared and have a formal PT eval. Pt was redirectable and requested home Trazadone 300mg  for sleep and tylenol for back pain.  O: BP (!) 144/100 (BP Location: Right Arm)   Pulse 98   Temp 98 F (36.7 C) (Oral)   Resp 18   SpO2 94%     A/P: Benzo OD - Given pt still unsteady, will continue fall precaution and telesitter - Restart home Trazadone 300mg  nightly. - Tylenol prn for pain  Arlyce Dice, MD 06/02/2022, 1:37 AM PGY-1, [facility] pager [phone]

## 2022-06-02 NOTE — Progress Notes (Signed)
Dr Marcina Millard informed pt talking about leaving AMA again----states he needs to go care for his mom and dad--, scheduled meds to be given shortly.

## 2022-06-02 NOTE — Discharge Summary (Signed)
[facility] Discharge Summary  Patient name: Matthew [name] Medical record number: [id number] Date of birth: 04/11/1976 Age: 47 y.o. Gender: male Date of Admission: 06/01/2022  Date of Discharge: 06/02/22 Admitting Physician: Lind Covert, MD  Primary Care Provider: [name], MD Consultants: Psychiatry  Indication for Hospitalization: Benzodiazepine overdose  Discharge Diagnoses/Problem List:  Principal Problem for Admission: Benzodiazepine overdose Other Problems addressed during stay:  Principal Problem:   Benzodiazepine (tranquilizer) overdose Active Problems:   Elevated serum creatinine   Type 2 diabetes mellitus with diabetic polyneuropathy, with long-term current use of insulin (HCC)   OSA (obstructive sleep apnea)   Schizoaffective disorder, bipolar type without good prognostic features ([place])   Attention deficit hyperactivity disorder (ADHD), combined type, moderate   Generalized anxiety disorder   Mixed dyslipidemia   Essential hypertension   Tobacco abuse   AMS (altered mental status)   Insomnia    Brief Hospital Course:  47 y.o. male presenting with AMS in setting of benzodiazepine overdose, with PMH significant for schizoaffective disorder, ADHD, GAD, depression, insomnia, T2DM, history of hyperprolactinemia, NSTEMI, CAD s/p stent, OSA, obesity. His hospital course is detailed below.    Benzodiazepine (tranquilizer) overdose Patient presented to ED BIB EMS for weakness, slurred speech and concern for intoxication. Patient noted to have had 1 mg xanax x 5 on morning of arrival to ED. Patient noted that he thought it would be fun, and denies any SI or suicide attempt. Patient had normal CT head, w/ negative levels of tylenol, ASA, and EtOH. Poison control was called who recommended patient be observed for 4 hours or more, until symptoms resolve. Patient placed on telemetry w/ virtual sitter. Patient had complete return to  baseline and was evaluated by PT, OT and Psychiatry, who felt patient stable for d/c home, and that he was not a concern for suicide attempt. Psychiatry adjusted xanax and lithium medications, but noted he would need to follow up outpatient to get his meds adjusted.   Elevated serum creatinine Patient comes in for benzodiazapine overdose, noted to have Cr of 1.27, w/ baseline ~1. Patient was started on mIVF with resolution in Cr. By D/c Cr was 1.02, and patient was hydrating PO.   All other conditions were chronic and stable Tobacco abuse Schizoaffective disorder, bipolar type OSA Hypothyroidism HLD CAD HTN Depression ADHD    Disposition: Home  Discharge Condition: Improved  Issues for Follow Up:  1. Medications need to be reviewed as his psych meds need re-evaluation. Patient on multiple CNS sedating medications  Discharge Exam:  Vitals:   06/02/22 0500 06/02/22 0923  BP: (!) 140/96 (!) 145/92  Pulse: 96 93  Resp: 19 18  Temp: 98.2 F (36.8 C) (!) 97.5 F (36.4 C)  SpO2: 94% 97%   Physical Exam: General: NAD, polite, conversational Neuro: A&Ox3, at baseline Cardiovascular: RRR, [employer] Respiratory: CTABL, no wheezing or rhonci Abdomen: Soft, NTTP, non-distended Extremities: No pitting edema, moving all extremities independently, able to stand and walk w/o balance issues   Significant Labs and Imaging:  Recent Labs  Lab 06/01/22 0647 06/01/22 0654 06/01/22 0802 06/01/22 1020  WBC 14.9*  --   --   --   HGB 14.9 15.3 14.3 14.3  HCT 43.6 45.0 42.0 42.0  PLT 248  --   --   --    Recent Labs  Lab 06/01/22 0647 06/01/22 0654 06/01/22 0802 06/01/22 1020 06/02/22 0244  NA 134* 136 139 141 136  K 3.8 3.7 3.6  3.6 3.1*  CL 101 103  --   --  105  CO2 21*  --   --   --  22  GLUCOSE 308* 323*  --   --  147*  BUN 17 18  --   --  10  CREATININE 1.27* 1.20  --   --  1.02  CALCIUM 9.1  --   --   --  8.4*  ALKPHOS 77  --   --   --   --   AST 30  --   --   --   --    ALT 37  --   --   --   --   ALBUMIN 4.1  --   --   --   --      Results/Tests Pending at Time of Discharge: None  Discharge Medications:  Allergies as of 06/02/2022       Reactions   Gluten Meal Other (See Comments)   Celiac disease   Lactose Intolerance (gi) Diarrhea   Can tolerate hard cheese        Medication List     TAKE these medications    ALPRAZolam 1 MG tablet Commonly known as: XANAX Take 1 tablet (1 mg total) by mouth 2 (two) times daily as needed for anxiety. What changed: when to take this   aspirin EC 81 MG tablet Take 1 tablet (81 mg total) by mouth daily. Swallow whole.   atomoxetine 80 MG capsule Commonly known as: STRATTERA Take 1 capsule (80 mg total) by mouth daily.   atorvastatin 80 MG tablet Commonly known as: LIPITOR Take 1 tablet (80 mg total) by mouth every morning.   Austedo XR 24 MG Tb24 Generic drug: Deutetrabenazine ER Take 24 mg by mouth daily.   carvedilol 25 MG tablet Commonly known as: COREG Take 1.5 tablets (37.5 mg total) by mouth 2 (two) times daily.   clopidogrel 75 MG tablet Commonly known as: PLAVIX Take 1 tablet (75 mg total) by mouth daily.   CO Q-10 PO Take 1 capsule by mouth daily.   Dexcom G7 Sensor Misc 1 Device by Does not apply route as directed.   DULoxetine 30 MG capsule Commonly known as: CYMBALTA Take 1 capsule (30 mg total) by mouth daily. Take 1 with the 60 mg=90 mg daily   DULoxetine 60 MG capsule Commonly known as: CYMBALTA Take 1 capsule (60 mg total) by mouth daily. Take with 30 mg=90 mg daily.   empagliflozin 25 MG Tabs tablet Commonly known as: Jardiance Take 1 tablet (25 mg total) by mouth daily before breakfast.   fenofibrate 145 MG tablet Commonly known as: Tricor Take 1 tablet (145 mg total) by mouth daily.   icosapent Ethyl 1 g capsule Commonly known as: VASCEPA Take 2 capsules (2 g total) by mouth 2 (two) times daily.   Insulin Pen Needle 32G X 4 MM Misc 1 Device by Does  not apply route daily.   Lantus SoloStar 100 UNIT/ML Solostar Pen Generic drug: insulin glargine Inject 25 Units into the skin daily.   levothyroxine 25 MCG tablet Commonly known as: SYNTHROID TAKE 1 TABLET IN THE MORNING WITH A GLASS OF WATER, WAIT 1 HOUR BEFORE TAKING OTHER MEDS OR EATING   lithium carbonate 300 MG ER tablet Commonly known as: LITHOBID Take 2 tablets (600 mg total) by mouth at bedtime. What changed: when to take this   metFORMIN 1000 MG tablet Commonly known as: GLUCOPHAGE Take 1 tablet (1,000 mg total)  by mouth 2 (two) times daily with a meal.   nicotine 14 mg/24hr patch Commonly known as: NICODERM CQ - dosed in mg/24 hours Place 1 patch (14 mg total) onto the skin daily. Start taking on: June 03, 2022   nitroGLYCERIN 0.4 MG SL tablet Commonly known as: Nitrostat Place 1 tablet (0.4 mg total) under the tongue every 5 (five) minutes as needed for chest pain.   Osteo Bi-Flex One Per Day Tabs Take 1 tablet by mouth daily.   perphenazine 16 MG tablet Commonly known as: TRILAFON TAKE 1 TABLET EVERY MORNING AND 2 TABLETS AT BEDTIME   pregabalin 100 MG capsule Commonly known as: LYRICA Take 1 capsule (100 mg total) by mouth 2 (two) times daily.   spironolactone 25 MG tablet Commonly known as: ALDACTONE Take 0.5 tablets (12.5 mg total) by mouth daily.   SUDAFED 12 HOUR PO Take 1 tablet by mouth daily as needed (congestion).   traZODone 100 MG tablet Commonly known as: DESYREL Take 3 tablets (300 mg total) by mouth at bedtime.   TURMERIC CURCUMIN PO Take 1 capsule by mouth daily.   zaleplon 10 MG capsule Commonly known as: SONATA 1 po qhs prn and may repeat 1 for midnocturnal awakening, as long as he has 3 hours left to sleep.        Discharge Instructions: Please refer to Patient Instructions section of EMR for full details.  Patient was counseled important signs and symptoms that should prompt return to medical care, changes in medications,  dietary instructions, activity restrictions, and follow up appointments.   Follow-Up Appointments:  Follow-up Information     [name], MD Follow up in 1 week(s).   Specialty: Family Medicine Contact information: [place] [address] [phone]                 Holley Bouche, MD 06/02/2022, 2:38 PM PGY-2, [place]

## 2022-06-02 NOTE — Evaluation (Addendum)
Occupational Therapy Evaluation Patient Details Name: Matthew [name] MRN: [id number] DOB: [birth year] Today's Date: 06/02/2022   History of Present Illness Pt is a 47 y/o male presenting with AMS in setting of Xanax overdose. PMH includes: schizoaffective disorder, paranoia, depression, ADHD, insomnia, HTN, DM2, OSA on CPAP, CAD, NSTEMI 2022, R shoulder arthroplasty 2023.   Clinical Impression   PTA patient reports independent with mobility, ADLs, and IADLs but not driving or working. He was admitted for above and presents with problem list below.  Pt presents with flat affect, with difficulty sequencing months backwards- pt reports this is baseline and he feels back to normal.  He is able to describe how he takes his medications, using timers and the importance of timing (on an empty stomach).  He does report some conflicting information after discussed case with PT, therefore recommend further functional cognitive testing using pill box.  Patient able to complete ADLs and functional mobility with supervision to modified independence.  If pts dcs today, recommend initial supervision with medication mgmt for safety. Will follow acutely, but anticipate no further needs after dc home.      Recommendations for follow up therapy are one component of a multi-disciplinary discharge planning process, led by the attending physician.  Recommendations may be updated based on patient status, additional functional criteria and insurance authorization.   Follow Up Recommendations  No OT follow up     Assistance Recommended at Discharge Intermittent Supervision/Assistance  Patient can return home with the following Direct supervision/assist for medications management;Direct supervision/assist for financial management;Assist for transportation    Functional Status Assessment  Patient has had a recent decline in their functional status and demonstrates the ability to make significant improvements in function  in a reasonable and predictable amount of time.  Equipment Recommendations  None recommended by OT    Recommendations for Other Services Speech consult     Precautions / Restrictions Precautions Precautions: Fall Restrictions Weight Bearing Restrictions: No      Mobility Bed Mobility Overal bed mobility: Independent                  Transfers Overall transfer level: Independent                        Balance Overall balance assessment: Mild deficits observed, not formally tested                                         ADL either performed or assessed with clinical judgement   ADL Overall ADL's : Modified independent                                     Functional mobility during ADLs: Supervision/safety General ADL Comments: demonstrates ability to complete ADLs including toilet transfers, LB dressing and grooming at sink.     Vision   Vision Assessment?: No apparent visual deficits     Perception     Praxis      Pertinent Vitals/Pain Pain Assessment Pain Assessment: Faces Faces Pain Scale: Hurts a little bit Pain Location: B thigh soreness Pain Descriptors / Indicators: Sore Pain Intervention(s): Monitored during session     Hand Dominance Right   Extremity/Trunk Assessment Upper Extremity Assessment Upper Extremity Assessment: Overall WFL for tasks assessed   Lower Extremity  Assessment Lower Extremity Assessment: Defer to PT evaluation       Communication Communication Communication: No difficulties   Cognition Arousal/Alertness: Awake/alert Behavior During Therapy: Flat affect Overall Cognitive Status: No family/caregiver present to determine baseline cognitive functioning Area of Impairment: Problem solving                             Problem Solving: Difficulty sequencing, Slow processing General Comments: pt reports worried about his parents, slow but able to count backwards  and stops in the middle of sequencing months backwards voicing "I can't do that normally".  Pt able to describe how and when he takes his medications.  Likely baseline cognition. After speaking to PT- discrepencies in assist at home and STE.  Further cognitive assessment may be warrented.     General Comments       Exercises     Shoulder Instructions      Home Living Family/patient expects to be discharged to:: Private residence Living Arrangements: Alone Available Help at Discharge: Available PRN/intermittently;Family Type of Home: Apartment Home Access: Level entry     Home Layout: One level     Bathroom Shower/Tub: Teacher, early years/pre: Standard     Home Equipment: None   Additional Comments: reports to PT 1 STE      Prior Functioning/Environment Prior Level of Function : Independent/Modified Independent               ADLs Comments: does not work/drive, independent ADLs, IADLs. reports using uber/his dad to assist with transportation.  he manages meds using timers.        OT Problem List: Decreased cognition;Decreased safety awareness      OT Treatment/Interventions: Self-care/ADL training;Therapeutic exercise;DME and/or AE instruction;Therapeutic activities;Patient/family education;Cognitive remediation/compensation    OT Goals(Current goals can be found in the care plan section) Acute Rehab OT Goals Patient Stated Goal: home OT Goal Formulation: With patient Time For Goal Achievement: 06/16/22 Potential to Achieve Goals: Good  OT Frequency: Min 2X/week    Co-evaluation              AM-PAC OT "6 Clicks" Daily Activity     Outcome Measure Help from another person eating meals?: None Help from another person taking care of personal grooming?: A Little Help from another person toileting, which includes using toliet, bedpan, or urinal?: A Little Help from another person bathing (including washing, rinsing, drying)?: A Little Help from  another person to put on and taking off regular upper body clothing?: A Little Help from another person to put on and taking off regular lower body clothing?: A Little 6 Click Score: 19   End of Session Nurse Communication: Mobility status  Activity Tolerance: Patient tolerated treatment well Patient left: with call bell/phone within reach;Other (comment) (sitting EOB)  OT Visit Diagnosis: Other abnormalities of gait and mobility (R26.89);Muscle weakness (generalized) (M62.81);Other symptoms and signs involving cognitive function                Time: 5400-8676 OT Time Calculation (min): 13 min Charges:  OT General Charges $OT Visit: 1 Visit OT Evaluation $OT Eval Low Complexity: 1 Low  Jolaine Artist, OT Acute Rehabilitation Services Office [phone]   Delight Stare 06/02/2022, 10:37 AM

## 2022-06-02 NOTE — Progress Notes (Signed)
DISCHARGE NOTE HOME Matthew [name] to be discharged Home per MD order. Discussed prescriptions and follow up appointments with the patient. medication list explained in detail. Patient verbalized understanding.  Skin clean, dry and intact without evidence of skin break down, no evidence of skin tears noted. IV catheter discontinued intact. Site without signs and symptoms of complications. Dressing and pressure applied. Pt denies pain at the site currently. No complaints noted.  Patient free of lines, drains, and wounds.   An After Visit Summary (AVS) was printed and given to the patient. Patient escorted via wheelchair, and discharged home via private auto.  Anastasio Auerbach, RN

## 2022-06-07 ENCOUNTER — Ambulatory Visit (INDEPENDENT_AMBULATORY_CARE_PROVIDER_SITE_OTHER): Payer: Medicare HMO | Admitting: Physician Assistant

## 2022-06-07 ENCOUNTER — Encounter: Payer: Self-pay | Admitting: Physician Assistant

## 2022-06-07 DIAGNOSIS — F411 Generalized anxiety disorder: Secondary | ICD-10-CM | POA: Diagnosis not present

## 2022-06-07 DIAGNOSIS — F250 Schizoaffective disorder, bipolar type: Secondary | ICD-10-CM

## 2022-06-07 DIAGNOSIS — Z79899 Other long term (current) drug therapy: Secondary | ICD-10-CM

## 2022-06-07 MED ORDER — GABAPENTIN 400 MG PO CAPS: 400.0000 mg | ORAL_CAPSULE | Freq: Three times a day (TID) | ORAL | 0 refills | Status: DC

## 2022-06-07 MED ORDER — LITHIUM CARBONATE ER 300 MG PO TBCR: 900.0000 mg | EXTENDED_RELEASE_TABLET | Freq: Every day | ORAL | 0 refills | Status: DC

## 2022-06-07 MED ORDER — TRAZODONE HCL 100 MG PO TABS: 300.0000 mg | ORAL_TABLET | Freq: Every day | ORAL | 0 refills | Status: DC

## 2022-06-07 NOTE — Progress Notes (Signed)
[facility] Med Check  Patient ID: CATARINO VOLD,  MRN: [id number]  PCP: [name], MD  Date of Evaluation: 06/07/2022 time spent:40 minutes  Chief Complaint:  Chief Complaint   Other    HISTORY/CURRENT STATUS For routine med check.  Overtook Xanax last week, admitted to hospital. [facility] via EMS b/c weakness, slurred speech. He's not sure how many he took. Wasn't trying to kill himself, "I thought it would be fun to take a bunch." Stayed overnight in hospital with w/u of CT head was nl. Saw psych who adjusted Li dose. He hasn't had Xanax in 6 days now. States he's very anxious but not having any withdrawals. No sweating, shaking, nausea or vomiting, palpitations, or other physical manifestations of withdrawal. 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   Patient doesn't do anything for fun. Energy and motivation are good.   No extreme sadness, tearfulness, or feelings of hopelessness.  Sleeps well most of the time. ADLs and personal hygiene are normal.   Denies any changes in concentration, making decisions, or remembering things.  Appetite has not changed.  Weight is stable.  Denies suicidal or homicidal thoughts.  Patient denies increased energy with decreased need for sleep, increased talkativeness, racing thoughts, impulsivity or risky behaviors, increased spending, increased libido, grandiosity, increased irritability or anger, paranoia, or hallucinations.  Review of Systems  Constitutional: Negative.   HENT: Negative.    Eyes: Negative.   Respiratory: Negative.    Cardiovascular: Negative.   Gastrointestinal: Negative.   Genitourinary: Negative.   Musculoskeletal: Negative.   Skin: Negative.   Neurological: Negative.   Endo/Heme/Allergies: Negative.   Psychiatric/Behavioral:         See HPI    Individual Medical History/ Review of Systems: Changes? :No     Past medications for mental health diagnoses  include: Risperdal, Zyprexa, Abilify, [name], Klonopin, Gabapentin, Xanax, Valium, Ativan, Hydroxyzine, Trilafon, Atenolol, Cymbalta, Prozac, Lexapro, Paxil, Lithium, Adderall, Ritalin, Mirtazepine, Sonata was ineffective.  Allergies: Gluten meal and Lactose intolerance (gi)  Current Medications:  Current Outpatient Medications:    gabapentin (NEURONTIN) 400 MG capsule, Take 1 capsule (400 mg total) by mouth 3 (three) times daily., Disp: 90 capsule, Rfl: 0   aspirin EC 81 MG tablet, Take 1 tablet (81 mg total) by mouth daily. Swallow whole., Disp: 90 tablet, Rfl: 3   atomoxetine (STRATTERA) 80 MG capsule, Take 1 capsule (80 mg total) by mouth daily., Disp: 30 capsule, Rfl: 2   atorvastatin (LIPITOR) 80 MG tablet, Take 1 tablet (80 mg total) by mouth every morning., Disp: 90 tablet, Rfl: 3   Boswellia-Glucosamine-Vit D (OSTEO BI-FLEX ONE PER DAY) TABS, Take 1 tablet by mouth daily., Disp: , Rfl:    carvedilol (COREG) 25 MG tablet, Take 1.5 tablets (37.5 mg total) by mouth 2 (two) times daily., Disp: 270 tablet, Rfl: 3   clopidogrel (PLAVIX) 75 MG tablet, Take 1 tablet (75 mg total) by mouth daily., Disp: 90 tablet, Rfl: 3   Coenzyme Q10 (CO Q-10 PO), Take 1 capsule by mouth daily., Disp: , Rfl:    Continuous Blood Gluc Sensor (DEXCOM G7 SENSOR) MISC, 1 Device by Does not apply route as directed., Disp: 9 each, Rfl: 3   Deutetrabenazine ER (AUSTEDO XR) 24 MG TB24, Take 24 mg by mouth daily., Disp: 30 tablet, Rfl: 11   DULoxetine (CYMBALTA) 30 MG capsule, Take 1 capsule (30 mg total) by mouth daily. Take 1 with the  60 mg=90 mg daily, Disp: 90 capsule, Rfl: 0   DULoxetine (CYMBALTA) 60 MG capsule, Take 1 capsule (60 mg total) by mouth daily. Take with 30 mg=90 mg daily., Disp: 90 capsule, Rfl: 0   empagliflozin (JARDIANCE) 25 MG TABS tablet, Take 1 tablet (25 mg total) by mouth daily before breakfast., Disp: 90 tablet, Rfl: 3   fenofibrate (TRICOR) 145 MG tablet, Take 1 tablet (145 mg total) by  mouth daily., Disp: 90 tablet, Rfl: 3   icosapent Ethyl (VASCEPA) 1 g capsule, Take 2 capsules (2 g total) by mouth 2 (two) times daily., Disp: 360 capsule, Rfl: 3   insulin glargine (LANTUS SOLOSTAR) 100 UNIT/ML Solostar Pen, Inject 25 Units into the skin daily., Disp: 30 mL, Rfl: 3   Insulin Pen Needle 32G X 4 MM MISC, 1 Device by Does not apply route daily., Disp: 150 each, Rfl: 3   levothyroxine (SYNTHROID) 25 MCG tablet, TAKE 1 TABLET IN THE MORNING WITH A GLASS OF WATER, WAIT 1 HOUR BEFORE TAKING OTHER MEDS OR EATING, Disp: 90 tablet, Rfl: 0   lithium carbonate (LITHOBID) 300 MG ER tablet, Take 3 tablets (900 mg total) by mouth at bedtime., Disp: 90 tablet, Rfl: 0   metFORMIN (GLUCOPHAGE) 1000 MG tablet, Take 1 tablet (1,000 mg total) by mouth 2 (two) times daily with a meal., Disp: 180 tablet, Rfl: 3   nicotine (NICODERM CQ - DOSED IN MG/24 HOURS) 14 mg/24hr patch, Place 1 patch (14 mg total) onto the skin daily., Disp: 28 patch, Rfl: 0   nitroGLYCERIN (NITROSTAT) 0.4 MG SL tablet, Place 1 tablet (0.4 mg total) under the tongue every 5 (five) minutes as needed for chest pain., Disp: 100 tablet, Rfl: 3   perphenazine (TRILAFON) 16 MG tablet, TAKE 1 TABLET EVERY MORNING AND 2 TABLETS AT BEDTIME, Disp: 270 tablet, Rfl: 0   pregabalin (LYRICA) 100 MG capsule, Take 1 capsule (100 mg total) by mouth 2 (two) times daily., Disp: 60 capsule, Rfl: 1   Pseudoephedrine HCl (SUDAFED 12 HOUR PO), Take 1 tablet by mouth daily as needed (congestion)., Disp: , Rfl:    spironolactone (ALDACTONE) 25 MG tablet, Take 0.5 tablets (12.5 mg total) by mouth daily. (Patient not taking: Reported on 10/13/2021), Disp: 45 tablet, Rfl: 3   traZODone (DESYREL) 100 MG tablet, Take 3 tablets (300 mg total) by mouth at bedtime., Disp: 270 tablet, Rfl: 0   TURMERIC CURCUMIN PO, Take 1 capsule by mouth daily., Disp: , Rfl:    zaleplon (SONATA) 10 MG capsule, 1 po qhs prn and may repeat 1 for midnocturnal awakening, as long as he  has 3 hours left to sleep., Disp: 60 capsule, Rfl: 1 Medication Side Effects: none  Family Medical/ Social History: Changes? no  MENTAL HEALTH EXAM:  There were no vitals taken for this visit.There is no height or weight on file to calculate BMI.  General Appearance: Casual, Well Groomed, and Obese  Eye Contact:  Good  Speech:  Clear and Coherent and Normal Rate  Volume:  Normal  Mood:  Euthymic  Affect:  Congruent  Thought Process:  Goal Directed and Descriptions of Associations: Circumstantial  Orientation:  Full (Time, Place, and Person)  Thought Content: Logical   Suicidal Thoughts:  No  Homicidal Thoughts:  No  Memory:  WNL  Judgement:  Fair  Insight:  Fair  Psychomotor Activity: normal  Concentration:  Concentration: Good and Attention Span: Fair  Recall:  Good  Fund of Knowledge: Good  Language: NA  Assets:  Desire for Improvement  ADL's:  Intact  Cognition: WNL  Prognosis:  Fair   Labs 06/01/2022 reviewed, see all under labs Li level 0.39  [place] Office Visit from 05/30/2022 in [facility] Office Visit from 04/20/2022 in [facility] Office Visit from 03/21/2022 in [facility] Total Score 3 7 17       PHQ2-9    Flowsheet Row CARDIAC REHAB PHASE II ORIENTATION from 08/16/2020 in [facility]  PHQ-2 Total Score 0      Flowsheet Row ED to Hosp-Admission (Discharged) from 06/01/2022 in [facility] Admission (Discharged) from 11/28/2021 in [facility] Testing 60 from 11/17/2021 in [facility] No Risk No Risk No Risk      DIAGNOSES:    ICD-10-CM   1. Schizoaffective disorder, bipolar type (HCC)  F25.0 Lithium level    Basic metabolic panel    2. Generalized anxiety disorder  F41.1      3. Encounter for long-term (current) use of medications  Z79.899 Lithium level    Basic metabolic panel      Receiving Psychotherapy: No   RECOMMENDATIONS:  PDMP reviewed.  Xanax filled 05/28/2022.  Sonata filled 05/31/2022. I provided 40  minutes of face to face time during this encounter, including time spent before and after the visit in records review, medical decision making, counseling pertinent to today's visit, and charting.   Discussed case with Dr. Clovis Pu. He's having no physical sx of withdrawal so will not restart a BZ.  Tx anxiety with Gabapentin. Benefits, risks, SE discussed and he accepts.   Continue Strattera 80 mg,, 1 p.o. daily. Continue Austedo XR 24 mg daily. Continue Cymbalta 90 mg daily.  Start Gabapentin 400 mg, 1 po tid.  Continue levothyroxine 25 mg every morning. Continue lithium CR 300 mg,3 qhs. Continue perphenazine 16 mg, 1 po q am, 2 qhs. Continue trazodone 100 mg, 3 p.o. nightly as needed sleep. Labs ordered as above.  Strongly recommend cousnseling. Return in 4 weeks.  Donnal Moat, PA-C

## 2022-06-12 ENCOUNTER — Telehealth: Payer: Self-pay | Admitting: Physician Assistant

## 2022-06-12 NOTE — Telephone Encounter (Signed)
[place] is having really bad withdrawals from Xanax.  The gabapentin is not helping at the level he is at.  Throwing up, not sleeping, sweats, etc. Can an increase be considered or is there something else that can help.  [facility] [address]

## 2022-06-12 NOTE — Telephone Encounter (Signed)
Please see message from patient. Patient is in the PMPD of getting 1 alprazolam from [place] resident. In Epic there is no active Rx for alprazolam. I know you had me cancel some medications but I don't remember what.

## 2022-06-13 NOTE — Telephone Encounter (Signed)
Increase Gabapentin 400 mg to qid.

## 2022-06-13 NOTE — Telephone Encounter (Signed)
Patient notified of recommendation. 

## 2022-06-15 DIAGNOSIS — M19011 Primary osteoarthritis, right shoulder: Secondary | ICD-10-CM | POA: Diagnosis not present

## 2022-06-19 ENCOUNTER — Telehealth: Payer: Self-pay | Admitting: Physician Assistant

## 2022-06-19 NOTE — Telephone Encounter (Signed)
PT called at 11:00 today. He feels like the Gabapentin needs to be increased. He is still very anxious and not getting relief.

## 2022-06-19 NOTE — Telephone Encounter (Signed)
Patient is again asking to increase gabapentin dose due to anxiety. On 1/23 you increased to 400 mg QID.

## 2022-06-21 ENCOUNTER — Other Ambulatory Visit: Payer: Self-pay

## 2022-06-21 MED ORDER — GABAPENTIN 400 MG PO CAPS: ORAL_CAPSULE | ORAL | 0 refills | Status: DC

## 2022-06-21 NOTE — Telephone Encounter (Signed)
On 1/23 he had increased gabapentin to 400 QID. He is asking to increase it further, says he is not getting any relief.

## 2022-06-21 NOTE — Telephone Encounter (Signed)
I'm sorry I didn't read your note correctly. Yes, have him take 400 mg, 2 pills in the morning, 1 around lunch, 1 late afternoon, 1 every evening. Total of 5 pills/day. Ok to send in new Rx for a month, no RF. Thank you.

## 2022-06-21 NOTE — Telephone Encounter (Signed)
Yes 400 mg 4 times daily is fine.  Do we need to send in a prescription?  If so please send in 120 pills no refills.  Thanks.

## 2022-06-21 NOTE — Telephone Encounter (Signed)
Rx sent and patient notified.

## 2022-06-22 ENCOUNTER — Other Ambulatory Visit: Payer: Self-pay | Admitting: Physician Assistant

## 2022-06-22 DIAGNOSIS — F250 Schizoaffective disorder, bipolar type: Secondary | ICD-10-CM

## 2022-06-22 NOTE — Telephone Encounter (Signed)
Ok to send

## 2022-06-25 ENCOUNTER — Other Ambulatory Visit: Payer: Self-pay | Admitting: Physician Assistant

## 2022-06-30 ENCOUNTER — Observation Stay (HOSPITAL_COMMUNITY)
Admission: EM | Admit: 2022-06-30 | Discharge: 2022-07-02 | Disposition: A | Discharge status: Other Acute Inpatient Hospital | Payer: Medicare HMO | Attending: Family Medicine | Admitting: Family Medicine

## 2022-06-30 ENCOUNTER — Other Ambulatory Visit: Payer: Self-pay

## 2022-06-30 ENCOUNTER — Encounter (HOSPITAL_COMMUNITY): Payer: Self-pay | Admitting: *Deleted

## 2022-06-30 ENCOUNTER — Emergency Department (HOSPITAL_COMMUNITY): Payer: Medicare HMO

## 2022-06-30 DIAGNOSIS — Z96611 Presence of right artificial shoulder joint: Secondary | ICD-10-CM | POA: Diagnosis not present

## 2022-06-30 DIAGNOSIS — Z7982 Long term (current) use of aspirin: Secondary | ICD-10-CM | POA: Insufficient documentation

## 2022-06-30 DIAGNOSIS — Z7952 Long term (current) use of systemic steroids: Secondary | ICD-10-CM | POA: Diagnosis not present

## 2022-06-30 DIAGNOSIS — I2510 Atherosclerotic heart disease of native coronary artery without angina pectoris: Secondary | ICD-10-CM | POA: Insufficient documentation

## 2022-06-30 DIAGNOSIS — E1142 Type 2 diabetes mellitus with diabetic polyneuropathy: Secondary | ICD-10-CM

## 2022-06-30 DIAGNOSIS — E039 Hypothyroidism, unspecified: Secondary | ICD-10-CM

## 2022-06-30 DIAGNOSIS — R5383 Other fatigue: Secondary | ICD-10-CM | POA: Diagnosis not present

## 2022-06-30 DIAGNOSIS — F250 Schizoaffective disorder, bipolar type: Secondary | ICD-10-CM | POA: Diagnosis not present

## 2022-06-30 DIAGNOSIS — Z794 Long term (current) use of insulin: Secondary | ICD-10-CM

## 2022-06-30 DIAGNOSIS — F17210 Nicotine dependence, cigarettes, uncomplicated: Secondary | ICD-10-CM | POA: Insufficient documentation

## 2022-06-30 DIAGNOSIS — R000 Tachycardia, unspecified: Secondary | ICD-10-CM | POA: Diagnosis not present

## 2022-06-30 DIAGNOSIS — I10 Essential (primary) hypertension: Secondary | ICD-10-CM | POA: Insufficient documentation

## 2022-06-30 DIAGNOSIS — T424X2A Poisoning by benzodiazepines, intentional self-harm, initial encounter: Secondary | ICD-10-CM | POA: Diagnosis present

## 2022-06-30 DIAGNOSIS — E119 Type 2 diabetes mellitus without complications: Secondary | ICD-10-CM | POA: Insufficient documentation

## 2022-06-30 DIAGNOSIS — Z1152 Encounter for screening for COVID-19: Secondary | ICD-10-CM | POA: Diagnosis not present

## 2022-06-30 DIAGNOSIS — R4182 Altered mental status, unspecified: Secondary | ICD-10-CM | POA: Diagnosis present

## 2022-06-30 DIAGNOSIS — G4733 Obstructive sleep apnea (adult) (pediatric): Secondary | ICD-10-CM | POA: Diagnosis present

## 2022-06-30 DIAGNOSIS — Z79899 Other long term (current) drug therapy: Secondary | ICD-10-CM | POA: Diagnosis not present

## 2022-06-30 DIAGNOSIS — T424X1A Poisoning by benzodiazepines, accidental (unintentional), initial encounter: Secondary | ICD-10-CM | POA: Diagnosis not present

## 2022-06-30 DIAGNOSIS — G4700 Insomnia, unspecified: Secondary | ICD-10-CM | POA: Diagnosis present

## 2022-06-30 DIAGNOSIS — F411 Generalized anxiety disorder: Secondary | ICD-10-CM | POA: Diagnosis present

## 2022-06-30 LAB — ACETAMINOPHEN LEVEL: Acetaminophen (Tylenol), Serum: <10 ug/mL — ABNORMAL LOW (ref 10–30)

## 2022-06-30 LAB — URINALYSIS, ROUTINE W REFLEX MICROSCOPIC
Bacteria, UA: NONE SEEN
Bilirubin Urine: NEGATIVE
Glucose, UA: >=500 mg/dL — AB
Hgb urine dipstick: NEGATIVE
Ketones, ur: NEGATIVE mg/dL
Leukocytes,Ua: NEGATIVE
Nitrite: NEGATIVE
Protein, ur: NEGATIVE mg/dL
Specific Gravity, Urine: 1.028 (ref 1.005–1.030)
pH: 6 (ref 5.0–8.0)

## 2022-06-30 LAB — I-STAT VENOUS BLOOD GAS, ED
Acid-Base Excess: 2 mmol/L (ref 0.0–2.0)
Bicarbonate: 27.2 mmol/L (ref 20.0–28.0)
Calcium, Ion: 1.17 mmol/L (ref 1.15–1.40)
HCT: 51 % (ref 39.0–52.0)
Hemoglobin: 17.3 g/dL — ABNORMAL HIGH (ref 13.0–17.0)
O2 Saturation: 91 %
Potassium: 4.1 mmol/L (ref 3.5–5.1)
Sodium: 141 mmol/L (ref 135–145)
TCO2: 29 mmol/L (ref 22–32)
pCO2, Ven: 44.7 mmHg (ref 44–60)
pH, Ven: 7.393 (ref 7.25–7.43)
pO2, Ven: 62 mmHg — ABNORMAL HIGH (ref 32–45)

## 2022-06-30 LAB — CBC WITH DIFFERENTIAL/PLATELET
Abs Immature Granulocytes: 0.04 10*3/uL (ref 0.00–0.07)
Basophils Absolute: 0.2 10*3/uL — ABNORMAL HIGH (ref 0.0–0.1)
Basophils Relative: 2 %
Eosinophils Absolute: 0.7 10*3/uL — ABNORMAL HIGH (ref 0.0–0.5)
Eosinophils Relative: 6 %
HCT: 50.8 % (ref 39.0–52.0)
Hemoglobin: 17 g/dL (ref 13.0–17.0)
Immature Granulocytes: 0 %
Lymphocytes Relative: 18 %
Lymphs Abs: 2.1 10*3/uL (ref 0.7–4.0)
MCH: 28.4 pg (ref 26.0–34.0)
MCHC: 33.5 g/dL (ref 30.0–36.0)
MCV: 84.8 fL (ref 80.0–100.0)
Monocytes Absolute: 0.6 10*3/uL (ref 0.1–1.0)
Monocytes Relative: 5 %
Neutro Abs: 7.8 10*3/uL — ABNORMAL HIGH (ref 1.7–7.7)
Neutrophils Relative %: 69 %
Platelets: 280 10*3/uL (ref 150–400)
RBC: 5.99 MIL/uL — ABNORMAL HIGH (ref 4.22–5.81)
RDW: 14 % (ref 11.5–15.5)
WBC: 11.5 10*3/uL — ABNORMAL HIGH (ref 4.0–10.5)
nRBC: 0 % (ref 0.0–0.2)

## 2022-06-30 LAB — COMPREHENSIVE METABOLIC PANEL
ALT: 42 U/L (ref 0–44)
AST: 29 U/L (ref 15–41)
Albumin: 4.7 g/dL (ref 3.5–5.0)
Alkaline Phosphatase: 79 U/L (ref 38–126)
Anion gap: 12 (ref 5–15)
BUN: 14 mg/dL (ref 6–20)
CO2: 22 mmol/L (ref 22–32)
Calcium: 9.6 mg/dL (ref 8.9–10.3)
Chloride: 104 mmol/L (ref 98–111)
Creatinine, Ser: 1.29 mg/dL — ABNORMAL HIGH (ref 0.61–1.24)
GFR, Estimated: >60 mL/min (ref >60)
Glucose, Bld: 189 mg/dL — ABNORMAL HIGH (ref 70–99)
Potassium: 4 mmol/L (ref 3.5–5.1)
Sodium: 138 mmol/L (ref 135–145)
Total Bilirubin: 0.5 mg/dL (ref 0.3–1.2)
Total Protein: 7.7 g/dL (ref 6.5–8.1)

## 2022-06-30 LAB — RESP PANEL BY RT-PCR (RSV, FLU A&B, COVID)  RVPGX2
Influenza A by PCR: NEGATIVE
Influenza B by PCR: NEGATIVE
Resp Syncytial Virus by PCR: NEGATIVE
SARS Coronavirus 2 by RT PCR: NEGATIVE

## 2022-06-30 LAB — TSH: TSH: 0.498 u[IU]/mL (ref 0.350–4.500)

## 2022-06-30 LAB — SALICYLATE LEVEL: Salicylate Lvl: <7 mg/dL — ABNORMAL LOW (ref 7.0–30.0)

## 2022-06-30 LAB — ETHANOL: Alcohol, Ethyl (B): <10 mg/dL (ref <10)

## 2022-06-30 LAB — RAPID URINE DRUG SCREEN, HOSP PERFORMED
Amphetamines: NOT DETECTED
Barbiturates: NOT DETECTED
Benzodiazepines: POSITIVE — AB
Cocaine: NOT DETECTED
Opiates: NOT DETECTED
Tetrahydrocannabinol: NOT DETECTED

## 2022-06-30 LAB — HEMOGLOBIN A1C
Hgb A1c MFr Bld: 8.3 % — ABNORMAL HIGH (ref 4.8–5.6)
Mean Plasma Glucose: 191.51 mg/dL

## 2022-06-30 LAB — LIPASE, BLOOD: Lipase: 28 U/L (ref 11–51)

## 2022-06-30 LAB — GLUCOSE, CAPILLARY
Glucose-Capillary: 142 mg/dL — ABNORMAL HIGH (ref 70–99)
Glucose-Capillary: 153 mg/dL — ABNORMAL HIGH (ref 70–99)
Glucose-Capillary: 251 mg/dL — ABNORMAL HIGH (ref 70–99)

## 2022-06-30 LAB — LITHIUM LEVEL: Lithium Lvl: 0.28 mmol/L — ABNORMAL LOW (ref 0.60–1.20)

## 2022-06-30 MED ORDER — FENOFIBRATE 160 MG PO TABS
160.0000 mg | ORAL_TABLET | Freq: Every day | ORAL | Status: DC
  Administered 2022-06-30 – 2022-07-02 (×3): 160 mg via ORAL
  Filled 2022-06-30 (×3): qty 1

## 2022-06-30 MED ORDER — DULOXETINE HCL 60 MG PO CPEP: 60.0000 mg | ORAL_CAPSULE | Freq: Every day | ORAL | Status: DC

## 2022-06-30 MED ORDER — LEVOTHYROXINE SODIUM 25 MCG PO TABS
25.0000 ug | ORAL_TABLET | Freq: Every morning | ORAL | Status: DC
  Administered 2022-07-01 – 2022-07-02 (×2): 25 ug via ORAL
  Filled 2022-06-30 (×2): qty 1

## 2022-06-30 MED ORDER — DULOXETINE HCL 30 MG PO CPEP
90.0000 mg | ORAL_CAPSULE | Freq: Every day | ORAL | Status: DC
  Administered 2022-06-30 – 2022-07-02 (×3): 90 mg via ORAL
  Filled 2022-06-30 (×3): qty 3

## 2022-06-30 MED ORDER — PERPHENAZINE 16 MG PO TABS: 16.0000 mg | ORAL_TABLET | Freq: Every day | ORAL | Status: DC

## 2022-06-30 MED ORDER — SODIUM CHLORIDE 0.9 % IV BOLUS
1000.0000 mL | Freq: Once | INTRAVENOUS | Status: AC
  Administered 2022-06-30: 1000 mL via INTRAVENOUS

## 2022-06-30 MED ORDER — ASPIRIN 81 MG PO TBEC
81.0000 mg | DELAYED_RELEASE_TABLET | Freq: Every day | ORAL | Status: DC
  Administered 2022-06-30 – 2022-07-02 (×3): 81 mg via ORAL
  Filled 2022-06-30 (×3): qty 1

## 2022-06-30 MED ORDER — ENOXAPARIN SODIUM 40 MG/0.4ML IJ SOSY
40.0000 mg | PREFILLED_SYRINGE | INTRAMUSCULAR | Status: DC
  Administered 2022-06-30 – 2022-07-01 (×2): 40 mg via SUBCUTANEOUS
  Filled 2022-06-30 (×2): qty 0.4

## 2022-06-30 MED ORDER — NICOTINE 14 MG/24HR TD PT24
14.0000 mg | MEDICATED_PATCH | Freq: Every day | TRANSDERMAL | Status: DC | PRN
  Administered 2022-07-01: 14 mg via TRANSDERMAL
  Filled 2022-06-30: qty 1

## 2022-06-30 MED ORDER — ICOSAPENT ETHYL 1 G PO CAPS
2.0000 g | ORAL_CAPSULE | Freq: Two times a day (BID) | ORAL | Status: DC
  Administered 2022-06-30 – 2022-07-02 (×4): 2 g via ORAL
  Filled 2022-06-30 (×5): qty 2

## 2022-06-30 MED ORDER — CARVEDILOL 25 MG PO TABS
37.5000 mg | ORAL_TABLET | Freq: Two times a day (BID) | ORAL | Status: DC
  Administered 2022-06-30 – 2022-07-02 (×4): 37.5 mg via ORAL
  Filled 2022-06-30 (×4): qty 1

## 2022-06-30 MED ORDER — CLOPIDOGREL BISULFATE 75 MG PO TABS
75.0000 mg | ORAL_TABLET | Freq: Every day | ORAL | Status: DC
  Administered 2022-06-30 – 2022-07-02 (×3): 75 mg via ORAL
  Filled 2022-06-30 (×3): qty 1

## 2022-06-30 MED ORDER — NICOTINE 14 MG/24HR TD PT24: 14.0000 mg | MEDICATED_PATCH | Freq: Every day | TRANSDERMAL | Status: DC

## 2022-06-30 MED ORDER — ATORVASTATIN CALCIUM 80 MG PO TABS
80.0000 mg | ORAL_TABLET | Freq: Every morning | ORAL | Status: DC
  Administered 2022-07-01 – 2022-07-02 (×2): 80 mg via ORAL
  Filled 2022-06-30 (×2): qty 1

## 2022-06-30 NOTE — Assessment & Plan Note (Addendum)
Offer CPAP nightly.

## 2022-06-30 NOTE — Progress Notes (Addendum)
Daily Progress Note Intern Pager: [phone]  Patient name: Matthew [name] Medical record number: [id number] Date of birth: Feb 09, 1976 Age: 47 y.o. Gender: male  Primary Care Provider: [name], MD Consultants: None Code Status: FULL CODE  Pt Overview and Major Events to Date:  2/10: Admitted   Assessment and Plan: Matthew [name] is a 47 y.o. male who was admitted for AMS thought to be due to benzodiazepine overdose. PMHx is significant for schizoaffective disorder, tobacco use, previous benzodiazepine overdose.   * Altered mental status Mentation has improved since admission,  appears back to his baseline. Diet resumed. TSH returned normal, lithium level returned subtherapeutic. Patient denies benzo overdose or taking any other medications inappropriately. He denies SI.  - 1: 1 sitter - Psychiatry consult  - Restart Lithium - CIWAs to monitor for benzo withdrawal - Holding further benzodiazepines for now  Type 2 diabetes mellitus with diabetic polyneuropathy, with long-term current use of insulin (HCC) A1c was 8.3. Home medications of metformin 1000 mg BID, insulin glargine 25 units daily, Jardiance 25 mg daily, pregabalin 100 mg BID (diabetic neuropathy) were held on admission. Given improved mentation, diet was restarted last night. CBG elevated. - Hold metformin and Pregabalin - Start Semglee 10U with mSSI without HS coverage - CBGs AC and qHS    Hypothyroid TSH returned at 0.498.  - Continue synthroid 25 mcg   Insomnia Taking trazodone. Hold for now.   Schizoaffective disorder, bipolar type without good prognostic features (HCC) Home meds: perphenazine 16 mg AM and 32 mg PM and lithium carbonate 900 mg daily, deutetrabenazine 24 mg daily for TD.  He reports taking lithium as prescribed but level returned subtherapeutic. - Consult psych, appreciate recommendations on medication management  - Reordered perphanzine - Hold deutetrabenazine  - Restart Lithium  given subtherapeutic level    OSA (obstructive sleep apnea) Offer CPAP nightly.   FEN/GI: Regular  PPx: Lovenox  Dispo:Home likely today. Barriers include Psych evaluation.   Subjective:  Patient was awake this morning.  Sitter at the bedside.  He reports no concerns.  He states that he must of slipped getting out of bed yesterday and hit his head. He denies taking any additional doses of medications. He denies any SI/hallucinations. He did tell me that a while ago he was seen at the [facility] urgent care and was hearing voices telling him to kill himself at that time.  He denies this happening at the present moment.  He reports that last months only had a Xanax overdose he "was being stupid" and thought that he could cure himself by taking all of his medications. He had taken multiple doses of several of his medications at that time. States it was not an attempt to commit suicide.   He lives over at the [facility]. He denies alcohol use. He does heavily smoke, has been smoking for the last 4 years. Currently smoking 1.5 packs a day, previously 3 packs a day.    Given that he was altered yesterday, we did discuss code status this morning. He reports he would not want compression but is okay with shock and medications. He is amenable to intubation and ventilation if needed.   Objective: Pulse Rate:  [35-103] 84 (02/11 0316) Resp:  [16-26] 18 (02/11 0316) BP: (112-152)/(75-125) 133/87 (02/11 0316) SpO2:  [91 %-100 %] 95 % (02/11 0316) Weight:  [108.9 kg] 108.9 kg (02/10 1136) Physical Exam: General: NAD, sitting up in bed, laceration noted between eyebrows Cardiovascular:  RRR without murmur Respiratory: CTAB, initially on 2L [place], weaned to room air, SpO2 94-95% Abdomen: Distended, non-tender to palpation in all quadrants Extremities: No edema Psych: Flat affect  Laboratory: Most recent CBC Lab Results  Component Value Date   WBC 11.5 (H) 06/30/2022   HGB 17.3 (H) 06/30/2022   HCT  51.0 06/30/2022   MCV 84.8 06/30/2022   PLT 280 06/30/2022   Most recent BMP    Latest Ref Rng & Units 06/30/2022   11:53 AM  BMP  Sodium 135 - 145 mmol/L 141   Potassium 3.5 - 5.1 mmol/L 4.1    Imaging/Diagnostic Tests: None new  Sabino Dick, DO 07/01/2022, 6:32 AM  PGY-3, [facility] FPTS Intern pager: [phone], text pages welcome Secure chat group [facility] Teaching Service

## 2022-06-30 NOTE — Progress Notes (Signed)
FMTS Brief Progress Note  S:patient awake and alert in bed when I examined him. He stated he was thirsty and hungry. When asked why he came to the hospital he states it was because he fell. Unable to tell me why he fell. Denied taking medications today.    O: BP 136/88 (BP Location: Right Arm)   Pulse 93   Resp 18   Ht 6' 3"$  (1.905 m)   Wt 108.9 kg   SpO2 98%   BMI 30.00 kg/m    General: alert, NAD CV: RRR no murmurs Resp: CTAB normal WOB  A/P:  AMS Unclear cause but likely due to benzo use. Workup has been unremarkable with normal CT, CXR, EKG, UA, electrolytes. UDS positive for benzos. Passed bedside swallow and diet added.  - monitor CIWAs  - 1:1 sitter in place   - Orders reviewed. Labs for AM ordered, which was adjusted as needed.   Remainder of plan per H&P  Shary Key, DO 06/30/2022, 7:56 PM PGY-3, [facility] Night Resident  Please page [phone] with questions.

## 2022-06-30 NOTE — ED Notes (Signed)
ED TO INPATIENT HANDOFF REPORT    S Name/Age/Gender Matthew [name] 47 y.o. male Room/Bed: 001C/001C  Code Status   Code Status: Prior  Home/SNF/Other Home Patient oriented to: self, place, time, and situation Is this baseline? No   Triage Complete: Triage complete  Chief Complaint Altered mental status [R41.82]  Triage Note Patient presents to ed via [facility] states neighbors called because patient wasn't acting normal. Patient was very sleepy and slow to respond. Per ems lots of empty pills bottles around the house. Patient is altered however will answer questions slowly. Denies drug or alcohol use. States he hasn't over taken his medication. Pupils 5 bilaterally and reactive.    Allergies Allergies  Allergen Reactions   Gluten Meal Other (See Comments)    Celiac disease   Lactose Intolerance (Gi) Diarrhea    Can tolerate hard cheese    Level of Care/Admitting Diagnosis ED Disposition     ED Disposition  Admit   Condition  --   [place]: [place] [100100]  Level of Care: Med-Surg [16]  May place patient in observation at [facility] or [place] if equivalent level of care is available:: No  Covid Evaluation: Asymptomatic - no recent exposure (last 10 days) testing not required  Diagnosis: Altered mental status [780.97.ICD-9-CM]  Admitting Physician: Leslie Dales [id number]  Attending Physician: Lenoria Chime [id number]          B Medical/Surgery History Past Medical History:  Diagnosis Date   Anxiety    Arthritis    Celiac disease    Coronary artery disease    Dairy allergy    Depression    Diabetes mellitus without complication ([place])    High cholesterol    Hypertension    Hypothyroidism    Myocardial infarction ([place])    Paranoia ([place])    Schizoaffective disorder ([place])    Past Surgical History:  Procedure Laterality Date   APPENDECTOMY     CARDIAC CATHETERIZATION     CORONARY STENT INTERVENTION N/A  06/06/2020   Procedure: CORONARY STENT INTERVENTION;  Surgeon: Martinique, Peter M, MD;  Location: [facility];  Service: Cardiovascular;  Laterality: N/A;  prox LAD, distal RCA   INTRAVASCULAR ULTRASOUND/IVUS N/A 06/06/2020   Procedure: Intravascular Ultrasound/IVUS;  Surgeon: Martinique, Peter M, MD;  Location: [facility];  Service: Cardiovascular;  Laterality: N/A;   LEFT HEART CATH AND CORONARY ANGIOGRAPHY N/A 06/06/2020   Procedure: LEFT HEART CATH AND CORONARY ANGIOGRAPHY;  Surgeon: Martinique, Peter M, MD;  Location: [facility];  Service: Cardiovascular;  Laterality: N/A;   LEFT HEART CATH AND CORONARY ANGIOGRAPHY N/A 02/24/2021   Procedure: LEFT HEART CATH AND CORONARY ANGIOGRAPHY;  Surgeon: Belva Crome, MD;  Location: [facility];  Service: Cardiovascular;  Laterality: N/A;   NASAL SEPTUM SURGERY     TOTAL SHOULDER ARTHROPLASTY Right 11/28/2021   Procedure: TOTAL SHOULDER ARTHROPLASTY;  Surgeon: Marchia Bond, MD;  Location: [facility];  Service: Orthopedics;  Laterality: Right;   WRIST FRACTURE SURGERY     right wrist     A IV Location/Drains/Wounds Patient Lines/Drains/Airways Status     Active Line/Drains/Airways     Name Placement date Placement time Site Days   Peripheral IV 06/30/22 20 G Anterior;Proximal;Right Forearm 06/30/22  1054  Forearm  less than 1   External Urinary Catheter 06/01/22  1600  --  29            Intake/Output Last 24  hours No intake or output data in the 24 hours ending 06/30/22 1443  Labs/Imaging Results for orders placed or performed during the hospital encounter of 06/30/22 (from the past 48 hour(s))  CBC with Differential     Status: Abnormal   Collection Time: 06/30/22 11:45 AM  Result Value Ref Range   WBC 11.5 (H) 4.0 - 10.5 K/uL   RBC 5.99 (H) 4.22 - 5.81 MIL/uL   Hemoglobin 17.0 13.0 - 17.0 g/dL   HCT 50.8 39.0 - 52.0 %   MCV 84.8 80.0 - 100.0 fL   MCH 28.4 26.0 - 34.0 pg   MCHC 33.5 30.0 - 36.0 g/dL   RDW  14.0 11.5 - 15.5 %   Platelets 280 150 - 400 K/uL   nRBC 0.0 0.0 - 0.2 %   Neutrophils Relative % 69 %   Neutro Abs 7.8 (H) 1.7 - 7.7 K/uL   Lymphocytes Relative 18 %   Lymphs Abs 2.1 0.7 - 4.0 K/uL   Monocytes Relative 5 %   Monocytes Absolute 0.6 0.1 - 1.0 K/uL   Eosinophils Relative 6 %   Eosinophils Absolute 0.7 (H) 0.0 - 0.5 K/uL   Basophils Relative 2 %   Basophils Absolute 0.2 (H) 0.0 - 0.1 K/uL   Immature Granulocytes 0 %   Abs Immature Granulocytes 0.04 0.00 - 0.07 K/uL    Comment: Performed at [facility], [address] [place], [place] [address]  Comprehensive metabolic panel     Status: Abnormal   Collection Time: 06/30/22 11:45 AM  Result Value Ref Range   Sodium 138 135 - 145 mmol/L   Potassium 4.0 3.5 - 5.1 mmol/L   Chloride 104 98 - 111 mmol/L   CO2 22 22 - 32 mmol/L   Glucose, Bld 189 (H) 70 - 99 mg/dL    Comment: Glucose reference range applies only to samples taken after fasting for at least 8 hours.   BUN 14 6 - 20 mg/dL   Creatinine, Ser 1.29 (H) 0.61 - 1.24 mg/dL   Calcium 9.6 8.9 - 10.3 mg/dL   Total Protein 7.7 6.5 - 8.1 g/dL   Albumin 4.7 3.5 - 5.0 g/dL   AST 29 15 - 41 U/L   ALT 42 0 - 44 U/L   Alkaline Phosphatase 79 38 - 126 U/L   Total Bilirubin 0.5 0.3 - 1.2 mg/dL   GFR, Estimated >60 >60 mL/min    Comment: (NOTE) Calculated using the CKD-EPI Creatinine Equation (2021)    Anion gap 12 5 - 15    Comment: Performed at [facility] [address] [place], [place] [address]  Lipase, blood     Status: None   Collection Time: 06/30/22 11:45 AM  Result Value Ref Range   Lipase 28 11 - 51 U/L    Comment: Performed at [facility] [address], [place], [place] [address]  Ethanol     Status: None   Collection Time: 06/30/22 11:45 AM  Result Value Ref Range   Alcohol, Ethyl (B) <10 <10 mg/dL    Comment: (NOTE) Lowest detectable limit for serum alcohol is 10 mg/dL.  For medical purposes only. Performed at [facility], [place] [address], [place], [place] [address]   Acetaminophen level     Status: Abnormal   Collection Time: 06/30/22 11:45 AM  Result Value Ref Range   Acetaminophen (Tylenol), Serum <10 (L) 10 - 30 ug/mL    Comment: (NOTE) Therapeutic  concentrations vary significantly. A range of 10-30 ug/mL  may be an effective concentration for many patients. However, some  are best treated at concentrations outside of this range. Acetaminophen concentrations >150 ug/mL at 4 hours after ingestion  and >50 ug/mL at 12 hours after ingestion are often associated with  toxic reactions.  Performed at [facility], [place] [address] [place], [place] [id number]   Salicylate level     Status: Abnormal   Collection Time: 06/30/22 11:45 AM  Result Value Ref Range   Salicylate Lvl [id number] (L) 7.0 - 30.0 mg/dL    Comment: Performed at [facility] [address], [place], [place] [address]  I-Stat venous blood gas, [facility], [facility], [facility])     Status: Abnormal   Collection Time: 06/30/22 11:53 AM  Result Value Ref Range   pH, Ven 7.393 7.25 - 7.43   pCO2, Ven 44.7 44 - 60 mmHg   pO2, Ven 62 (H) 32 - 45 mmHg   Bicarbonate 27.2 20.0 - 28.0 mmol/L   TCO2 29 22 - 32 mmol/L   O2 Saturation 91 %   Acid-Base Excess 2.0 0.0 - 2.0 mmol/L   Sodium 141 135 - 145 mmol/L   Potassium 4.1 3.5 - 5.1 mmol/L   Calcium, Ion 1.17 1.15 - 1.40 mmol/L   HCT 51.0 39.0 - 52.0 %   Hemoglobin 17.3 (H) 13.0 - 17.0 g/dL   Sample type VENOUS   Urinalysis, Routine w reflex microscopic -Urine, Clean Catch     Status: Abnormal   Collection Time: 06/30/22  1:38 PM  Result Value Ref Range   Color, Urine YELLOW YELLOW   APPearance CLEAR CLEAR   Specific Gravity, Urine 1.028 1.005 - 1.030   pH 6.0 5.0 - 8.0   Glucose, UA >=500 (A) NEGATIVE mg/dL   Hgb urine dipstick NEGATIVE NEGATIVE   Bilirubin Urine NEGATIVE NEGATIVE   Ketones, ur NEGATIVE NEGATIVE mg/dL   Protein, ur NEGATIVE NEGATIVE mg/dL   Nitrite NEGATIVE NEGATIVE    Leukocytes,Ua NEGATIVE NEGATIVE   RBC / HPF 0-5 0 - 5 RBC/hpf   WBC, UA 0-5 0 - 5 WBC/hpf   Bacteria, UA NONE SEEN NONE SEEN   Squamous Epithelial / HPF 0-5 0 - 5 /HPF    Comment: Performed at [facility], [place] [address], [place], [place] [address]  Rapid urine drug screen (hospital performed)     Status: Abnormal   Collection Time: 06/30/22  1:38 PM  Result Value Ref Range   Opiates NONE DETECTED NONE DETECTED   Cocaine NONE DETECTED NONE DETECTED   Benzodiazepines POSITIVE (A) NONE DETECTED   Amphetamines NONE DETECTED NONE DETECTED   Tetrahydrocannabinol NONE DETECTED NONE DETECTED   Barbiturates NONE DETECTED NONE DETECTED    Comment: (NOTE) DRUG SCREEN FOR MEDICAL PURPOSES ONLY.  IF CONFIRMATION IS NEEDED FOR ANY PURPOSE, NOTIFY LAB WITHIN 5 DAYS.  LOWEST DETECTABLE LIMITS FOR URINE DRUG SCREEN Drug Class                     Cutoff (ng/mL) Amphetamine and metabolites    1000 Barbiturate and metabolites    200 Benzodiazepine                 200 Opiates and metabolites        300 Cocaine and metabolites        300 THC  50 Performed at [facility], [place] [address], [place], [place] [address]    CT Head Wo Contrast  Result Date: 06/30/2022 CLINICAL DATA:  Mental status change.  Decreased responsiveness. EXAM: CT HEAD WITHOUT CONTRAST TECHNIQUE: Contiguous axial images were obtained from the base of the skull through the vertex without intravenous contrast. RADIATION DOSE REDUCTION: This exam was performed according to the departmental dose-optimization program which includes automated exposure control, adjustment of the mA and/or kV according to patient size and/or use of iterative reconstruction technique. COMPARISON:  06/01/2022. FINDINGS: Brain: No evidence of acute infarction, hemorrhage, hydrocephalus, extra-axial collection or mass lesion/mass effect. Vascular: No hyperdense vessel or unexpected calcification. Skull: Normal.  Negative for fracture or focal lesion. Sinuses/Orbits: Globes and orbits are unremarkable. Sinuses are clear. Other: None. IMPRESSION: No acute intracranial abnormalities. No change from the recent prior study. Electronically Signed   By: Lajean Manes M.D.   On: 06/30/2022 13:08   DG Chest Portable 1 View  Result Date: 06/30/2022 CLINICAL DATA:  Fatigue EXAM: PORTABLE CHEST 1 VIEW COMPARISON:  None Available. FINDINGS: The lateral left lung base is poorly evaluated due to portable technique. Within visualized limits, there is stable cardiomegaly. The hila and mediastinum are unchanged. No pneumothorax. No nodules or masses. No other abnormalities. IMPRESSION: 1. The lateral left lung base is poorly evaluated due to portable technique. Recommend PA and lateral chest x-ray. 2. Stable cardiomegaly. 3. No other abnormalities. Electronically Signed   By: Dorise Bullion III M.D.   On: 06/30/2022 11:46    Pending Labs Unresulted Labs (From admission, onward)     Start     Ordered   06/30/22 1200  Lithium level  Once,   R        06/30/22 1200   06/30/22 1119  Resp panel by RT-PCR (RSV, Flu A&B, Covid) Anterior Nasal Swab  Once,   URGENT        06/30/22 1118            Vitals/Pain Today's Vitals   06/30/22 1130 06/30/22 1134 06/30/22 1136  BP: 139/75    Pulse: 99    Resp: (!) 21    SpO2: 91%    Weight:   108.9 kg  Height:   6' 3"$  (1.905 m)  PainSc:  0-No pain     Isolation Precautions No active isolations  Medications Medications  sodium chloride 0.9 % bolus 1,000 mL (0 mLs Intravenous Stopped 06/30/22 1154)  sodium chloride 0.9 % bolus 1,000 mL (0 mLs Intravenous Stopped 06/30/22 1156)    Mobility     Focused Assessments Neuro Assessment Handoff:  Swallow screen pass? No          Neuro Assessment:   Neuro Checks:      Has TPA been given? No If patient is a Neuro Trauma and patient is going to OR before floor call report to [facility] nurse: [phone] or  [phone]   R Recommendations: See Admitting Provider Note  Report given to:

## 2022-06-30 NOTE — ED Triage Notes (Signed)
Patient presents to ed via [facility] states neighbors called because patient wasn't acting normal. Patient was very sleepy and slow to respond. Per ems lots of empty pills bottles around the house. Patient is altered however will answer questions slowly. Denies drug or alcohol use. States he hasn't over taken his medication. Pupils 5 bilaterally and reactive.

## 2022-06-30 NOTE — Assessment & Plan Note (Addendum)
Taking trazodone. Hold for now.

## 2022-06-30 NOTE — Assessment & Plan Note (Addendum)
A1c was 8.3. Home medications of metformin 1000 mg BID, insulin glargine 25 units daily, Jardiance 25 mg daily, pregabalin 100 mg BID (diabetic neuropathy) were held on admission. Given improved mentation, diet was restarted last night. CBG elevated. - Hold metformin and Pregabalin - Start Semglee 10U with mSSI without HS coverage - CBGs AC and qHS

## 2022-06-30 NOTE — Assessment & Plan Note (Addendum)
Mentation now back at baseline. TSH returned normal, lithium level returned subtherapeutic. Patient denies benzo overdose or taking any other medications inappropriately. He denies SI.  - psych consulted, appreciate recs - CIWAs to monitor for benzo withdrawal - On 0.5 mg clonazepam BID PRN to prevent benzo withdrawal

## 2022-06-30 NOTE — Assessment & Plan Note (Signed)
TSH returned at 0.498.  - Continue synthroid 25 mcg

## 2022-06-30 NOTE — Hospital Course (Addendum)
Matthew [name] is a 48 y.o. male who was admitted to [facility] on 2/10 for altered mental status suspected due to benzodiazepine overdose.  Hospital course is listed below by problem, refer to the H&P for additional information.  AMS 2/2 Benzodiazepine Overdose Patient presented via EMS due to AMS noticed by neighbors. Per EMS there were pill bottles "all over" the household. Vitals stable, afebrile. Pupils were not pinpoint. ED workup included negative Acetaminophen and Salicylate level, VBG without hypercarbia, UDS positive for benzodiazepines. CT head unremarkable. Patient was initially somnolent but able to wake up briefly to answer questions and follow command but difficult to understand. Was protecting airway. CIWA ordered to monitor for benzo withdrawal, remained stable***. Psychiatry was consulted due to previous hx of benzo overdose, unclear cause and concern for possible SI attempt as cause. Psychiatry recommended inpatient admission given hx of repeated attempt at [facility] and inability to contract for safety.   Type 2 Diabetes Mellitus A1c 8.3 this admission. Home medications initially held given AMS. Was restarted on moderate SSI during hospitalization. Pregabalin held given altered status.    Other conditions chronic and stable.    Psych recs IVCing since he is a danger to himself. Needs to be evaled by psych.

## 2022-06-30 NOTE — H&P (Cosign Needed Addendum)
Hospital Admission History and Physical Service Pager: [phone]  Patient name: Matthew [name] Medical record number: [id number] Date of Birth: Sep 10, 1975 Age: 47 y.o. Gender: male  Primary Care Provider: [name], MD Consultants: None Code Status: Full code (patient unable to participate in conversation) Preferred Emergency Contact: Patient unable to tell us.  Per chart, Berdine Addison, sister [phone]  Chief Complaint: AMS  Assessment and Plan: Matthew [name] is a 47 y.o. male presenting with AMS, brought in by EMS after neighbors called. Differential and plan below.  * Altered mental status Sleepy but arousable, able to follow commands.  Briefly wakes to answer questions, difficult to understand due to mumbling-but is able to follow commands.  Denies taking any medications today-but not reliable source of information at this moment. Remains a level 5 caveat at this time.  Differential for AMS is broad and multiple etiologies were considered.  Low suspicion for stroke (normal CT head, no focal neurologic deficits), low suspicion for seizure but remains on differential (benzodiazepine use, found on ground). History of NSTEMI in 05/2020- EKG without acute ischemia, consider repeat EKG and trops if patient has chest pain. Infectious etiology considered, very mild leukocytosis (11.5), afebrile, COVID flu negative, did not meet SIRS criteria, COVID/flu negative, UA negative for infection-defer blood cultures at this time.  History of hypothyroid, on Synthroid-will check TSH (currently low concern for myxedema coma, normothermic, no signs of edema, normal electrolytes) ethanol negative, do not believe his history includes alcohol abuse-lower suspicion of Warnicke encephalopathy.  Liver function normal, low suspicion of hepatic causes. Spilling glucose in urine, blood glucose of 188-with normal anion gap/no acidosis lower concern of HHS and DKA. Reassuringly not hypoglycemic, no acidotic, not  hypoxic, normal electrolytes.  Leading differential is toxic encephalopathy 2/2 benzodiazapine overdose (benzodiazepine positive), unclear if intentional or not (previous admission 06/01/2022 for benzo overdose). Will order 1:1 sitter. Patient carries a diagnosis of schizoaffective disorder and anxiety-  saw psychiatry during last admission was deemed safe to self, will obtain consult when patient has resolved AMS.  Will admit for observation. Appears to have poor follow-up. - Admit to [facility] progressive, attending Dr. Thompson Grayer - Lower floor status, once AMS resolved - 1: 1 sitter - Psychiatry consult tomorrow morning - Hold lithium, pending lithium level - Hold benzodiazepines, will need to readjust once AMS resolved - Check TSH - N.p.o. until AMS resolved - Restart home meds as appropriate  Type 2 diabetes mellitus with diabetic polyneuropathy, with long-term current use of insulin (HCC) A1c was 8.8 on 03/19/22. Recheck. Home meds: metformin 1000 mg BID, insulin glargine 25 units daily, Jardiance 25 mg daily, pregabalin 100 mg BID (diabetic neuropathy) - Hold metformin and insulin - Hold Pregabalin 2/2 AMS - CBGs q4h - Start SSI pending CBGs  Insomnia Taking trazodone and Sonata. Hold with AMS.  Schizoaffective disorder, bipolar type without good prognostic features ([place]) Home meds: perphenazine 16 mg AM and 32 mg PM and lithium carbonate 600 mg daily, deutetrabenazine 24 mg daily for TD.  Lithium level possibly subtherapeutic. - Reordered perphanzine, hold during AMS - Hold deutetrabenazine d/t AMS - Lithium level pending, hold lithium - Consult psych  OSA (obstructive sleep apnea) Offer CPAP when patient is awake and no longer altered.   Tobacco abuse 2 ppd history. Nicotine 14 mg patch ordered- per patient request.  Chronic and stable Hypothyroidism: Levothyroxine 25 mcg at home HLD, CAD (Hx NSTEMI), HTN: Carvedilol 37.5 mg daily, Plavix 75 mg daily, spironolactone 12.5 mg  daily, fenofibrate 145 mg daily ADHD: Hold atomoxetine 80 mg Depression: Duloxetine 90 mg daily  FEN/GI: NPO, start carb mod diet when appropriate VTE Prophylaxis: Lovenox  Disposition: Progressive  History of Present Illness:  Matthew [name] is a 47 y.o. male presenting with AMS  Level 5 caveat.  However, patient is arousable during admission exam.  Patient mumbles, states he did not take any medications today or last night.  Patient says he was brought in by EMS, after he called.  Per ED provider and EMS checkout, neighbors had called for him.  He was found lying on the ground.  In the ED, extensive workup was completed.  CT head negative, CXR negative, EKG normal sinus without ischemia, UDS positive for benzodiazepines, UA negative for infection, normal blood gas, not hypoglycemic.  Family medicine paged for admission.  Review Of Systems: Per HPI   Pertinent Past Medical History: - Schizoaffective disorder - Paranoia - Depression - ADHD - Insomnia - Hypothyroidism - HTN - T2DM - OSA on CPAP - CAD - NSTEMI in 2022  Remainder reviewed in history tab.   Pertinent Past Surgical History: - Heart cath and angio (2022) - R shoulder arthroplasty (2023) - Coronary stent (2022) - Appendectomy   Remainder reviewed in history tab.  Pertinent Social History: Tobacco use: 2 PPD for past 2 years, per chart review Alcohol use: Rare occasions, per chart review Other Substance use: Unknown Lives at [facility]; independent living for people with disabilities per chart review  Pertinent Family History: N/A  Remainder reviewed in history tab.   Important Outpatient Medications: Pending med rec, patient too sleepy for reliable med history and interview.  Objective: BP 136/88 (BP Location: Right Arm)   Pulse 93   Resp 18   Ht 6' 3"$  (1.905 m)   Wt 108.9 kg   SpO2 98%   BMI 30.00 kg/m  Exam: General: NAD, resting comfortably, arousable Cardiovascular: RRR, no  murmurs Respiratory: CTAB, normal breathing room air, protecting airway well Gastrointestinal: Soft, not tender, protuberant.  Bowel sounds present Derm: Warm and dry.  Dried blood near IV site where patient pulled his IV. Neuro: Patient was able to complete some parts of neurologic exam, limited by sleepiness CN II: PERRL CN III, IV,VI: Unable to follow command CV V: Unable to tell us CVII: Symmetric smile and brow raise CN VIII: Normal hearing CN IX,X: Unable to follow command CN XI: 5/5 shoulder shrug CN XII: Symmetric tongue protrusion  UE and LE strength 5/5  Labs:  CBC BMET  Recent Labs  Lab 06/30/22 1145 06/30/22 1153  WBC 11.5*  --   HGB 17.0 17.3*  HCT 50.8 51.0  PLT 280  --    Recent Labs  Lab 06/30/22 1145 06/30/22 1153  NA 138 141  K 4.0 4.1  CL 104  --   CO2 22  --   BUN 14  --   CREATININE 1.29*  --   GLUCOSE 189*  --   CALCIUM 9.6  --     UDS: Positive for benzodiazepines Tylenol/salicylate: Negative Lipase: WNL Ethanol: Negative UA: No infection, spilling glucose I-STAT venous blood gas: Normal pH  EKG: Normal sinus, no acute ischemia  Imaging Studies Performed:  CT Head Wo Contrast Result Date: 06/30/2022 IMPRESSION: No acute intracranial abnormalities  DG Chest Portable 1 View Result Date: 06/30/2022 IMPRESSION: 1. The lateral left lung base is poorly evaluated due to portable technique. Recommend PA and lateral chest x-ray. 2. Stable cardiomegaly. 3. No other  abnormalities.    Leslie Dales, DO 06/30/2022, 6:04 PM PGY-1, [place] Intern pager: [phone], text pages welcome Secure chat group [place]   I was personally present and performed or re-performed the history, physical exam and medical decision making activities of this service and have verified that the service and findings are accurately documented in the student's note.  Holley Bouche, MD                   06/30/2022, 6:30 PM

## 2022-06-30 NOTE — Progress Notes (Deleted)
Cardiology Office Note:    Date:  06/30/2022   ID:  Matthew [name], DOB 17-Feb-1976, MRN [id number]  PCP:  [name], MD   [place]  Cardiologist:  Freada Bergeron, MD  Advanced Practice Provider:  No care team member to display Electrophysiologist:  None   Referring MD: [name], MD    History of Present Illness:    Matthew [name] is a 47 y.o. male with a hx of HTN, HLD, pre-DM, celiac dz, schizoaffective d/o, anxiety, ADHD, OSA not on CPAP, and recent NSTEMI s/p DES to pLAD and DES to dRCA who presents to clinic for follow-up.   Patient admitted with NSTEMI 06/06/20 treated with DES pLAD and DES dRCA, and severe LVEF 25-30%. Patient left AMA 06/08/19 and was seen urgently in clinic by Ermalinda Barrios. During that visit, he was more SOB and had been eating a lot of fast food and had not filled his scripts for atenolol or lisinopril. Since that time, he has filled his prescriptions and has been taking all medications as prescribed. He understands the importance of taking his medications and that stopping his ASA or brilinta could lead to a massive MI and/or death. He states that since his hospitalization, he has been feeling overall okay.  Occasionally has some palpitations with some short of breath and mild lightheadedness. Episodes occur about every other day and last a couple of minutes before resolving. Can happen at rest or with activity. No chest pain.   During visit on 06/22/20, the patient had been having palpitations. He was referred for cardiac monitor and cardiac rehab. Cardiac monitor showed: rare ectopy with no significant arrhythmias or ectopy.  Was admitted on 10/07-10/08/22 for chest pain. Cath showed patent LAD stent, 25% Lcx, 25-45% prox RCA disease, anterior hypokinesis with EF 50%, LVEDP 63mHg. Recommended for continued medical management.  Readmitted 04/23/21 for chest pain and syncope. ECG nonischemic. Trop negative. TTE with LVEF  60-65%, normal RV, no significant valve disease. Symptoms thought to be vagal in nature as he fainted with coughing. Discharged home.  He saw EDiona Browner NP 05/31/21 after ED visit for syncope. He reported his episodes occurred while in the bathroom or coughing. He also reported sharp mid-lower chest pain that could last for hours up to days that was not improved with nitroglycerin. He was back to smoking 2 to 3 ppd. Lexiscan Myoview was reordered but not performed.   Was last seen in clinic on 10/2021 where he was doing well from a CV standpoint. Had quit smoking!  Today, ***  Past Medical History:  Diagnosis Date   Anxiety    Arthritis    Celiac disease    Coronary artery disease    Dairy allergy    Depression    Diabetes mellitus without complication (HCC)    High cholesterol    Hypertension    Hypothyroidism    Myocardial infarction ([place]    Paranoia ([place]    Schizoaffective disorder ([place]     Past Surgical History:  Procedure Laterality Date   APPENDECTOMY     CARDIAC CATHETERIZATION     CORONARY STENT INTERVENTION N/A 06/06/2020   Procedure: CORONARY STENT INTERVENTION;  Surgeon: JMartinique Peter M, MD;  [facility];  Service: Cardiovascular;  Laterality: N/A;  prox LAD, distal RCA   INTRAVASCULAR ULTRASOUND/IVUS N/A 06/06/2020   Procedure: Intravascular Ultrasound/IVUS;  Surgeon: JMartinique Peter M, MD;  [facility];  Service: Cardiovascular;  Laterality:  N/A;   LEFT HEART CATH AND CORONARY ANGIOGRAPHY N/A 06/06/2020   Procedure: LEFT HEART CATH AND CORONARY ANGIOGRAPHY;  Surgeon: Martinique, Peter M, MD;  Location: [facility];  Service: Cardiovascular;  Laterality: N/A;   LEFT HEART CATH AND CORONARY ANGIOGRAPHY N/A 02/24/2021   Procedure: LEFT HEART CATH AND CORONARY ANGIOGRAPHY;  Surgeon: Belva Crome, MD;  Location: [facility];  Service: Cardiovascular;  Laterality: N/A;   NASAL SEPTUM SURGERY     TOTAL SHOULDER ARTHROPLASTY  Right 11/28/2021   Procedure: TOTAL SHOULDER ARTHROPLASTY;  Surgeon: Marchia Bond, MD;  Location: [facility];  Service: Orthopedics;  Laterality: Right;   WRIST FRACTURE SURGERY     right wrist    Current Medications: No outpatient medications have been marked as taking for the 07/02/22 encounter (Appointment) with Freada Bergeron, MD.     Allergies:   Gluten meal and Lactose intolerance (gi)   Social History   Socioeconomic History   Marital status: Single    Spouse name: Not on file   Number of children: 0   Years of education: 14   Highest education level: Associate degree: occupational, Hotel manager, or vocational program  Occupational History   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, multimedia, unemployed  Tobacco Use   Smoking status: Some Days    Packs/day: 1.00    Types: Cigarettes, E-cigarettes    Last attempt to quit: 10/19/2021    Years since quitting: 0.6   Smokeless tobacco: Never  Vaping Use   Vaping Use: Former  Substance and Sexual Activity   Alcohol use: No    Alcohol/week: 0.0 standard drinks of alcohol    Comment: no (02/08/15)   Drug use: No   Sexual activity: Not on file  Other Topics Concern   Not on file  Social History Narrative   Pt lives at the [place] Determinants of Health   Financial Resource Strain: Not on file  Food Insecurity: Not on file  Transportation Needs: Not on file  Physical Activity: Not on file  Stress: Not on file  Social Connections: Not on file     Family History: The patient's family history includes Asthma in his mother; Hypertension in his father and mother.  ROS:   Please see the history of present illness.    Review of Systems  Constitutional:  Negative for chills, fever and malaise/fatigue.  HENT:  Negative for congestion and hearing loss.   Eyes:  Negative for blurred vision and redness.  Respiratory:  Negative for cough and shortness of breath.   Cardiovascular:  Positive for palpitations. Negative for chest pain,  orthopnea, claudication, leg swelling and PND.  Gastrointestinal:  Negative for constipation, nausea and vomiting.  Genitourinary:  Negative for dysuria and flank pain.  Musculoskeletal:  Negative for back pain and falls.       Positive for bilateral hand swelling  Neurological:  Positive for sensory change. Negative for dizziness, loss of consciousness and headaches.  Endo/Heme/Allergies:  Negative for polydipsia.  Psychiatric/Behavioral:  Negative for substance abuse. Hallucinations: Auditory.    EKGs/Labs/Other Studies Reviewed:    The following studies were reviewed today: Carotid Duplex 06/14/21 Summary:  Right Carotid: The extracranial vessels were near-normal with only minimal wall thickening or plaque.   Left Carotid: The extracranial vessels were near-normal with only minimal wall thickening or plaque.   Vertebrals:  Bilateral vertebral arteries demonstrate antegrade flow.   Subclavians: Normal flow hemodynamics were seen in bilateral subclavian arteries.  Monitor 05/19/21 Patch wear  time was 11 days and 17 hours Predominant rhythm was NSR/sinus tachycardia with average HR 101bpm (ranging 56-141) Rare ectopy (<1% SVE; <1% VE) No sustained arrhythmias or significant pauses.     Patch Wear Time:  11 days and 17 hours (2022-12-12T19:25:03-499 to 2022-12-24T12:40:12-498)   Patient had a min HR of 56 bpm, max HR of 140 bpm, and avg HR of 101 bpm. Predominant underlying rhythm was Sinus Rhythm. Isolated SVEs were rare (<1.0%), and no SVE Couplets or SVE Triplets were present. Isolated VEs were rare (<1.0%, 9085), VE Couplets  were rare (<1.0%, 31), and VE Triplets were rare (<1.0%, 1). Ventricular Bigeminy and Trigeminy were present.   Echo 04/24/21 1. Left ventricular ejection fraction, by estimation, is 60 to 65%. The left ventricle has normal function. The left ventricle has no regional wall motion abnormalities. There is mild left ventricular hypertrophy. Left ventricular  diastolic parameters were normal.   2. Right ventricular systolic function is normal. The right ventricular size is normal. Tricuspid regurgitation signal is inadequate for assessing PA pressure.   3. The mitral valve is normal in structure. No evidence of mitral valve regurgitation.   4. The aortic valve was not well visualized. Aortic valve regurgitation is not visualized. No aortic stenosis is present.   L Heart Cath 02/24/21 CONCLUSIONS: Normal left main Patent LAD stent with minimal luminal irregularities otherwise. 25% mid circumflex. Dominant right coronary with patent distal RCA stent.  Proximal tandem 25 and 45% stenoses.  Mild to moderate diffuse atherosclerosis noted in the right coronary. Anterior hypokinesis with EF 50%.  LVEDP 7 mmHg.   RECOMMENDATIONS:   Aggressive risk factor modification including smoking cessation, glycemic control, blood pressure control, and lipid management of LDL to less than 70.  Echo 08/12/20 1. Left ventricular ejection fraction, by estimation, is 65 to 70%. The left ventricle has normal function. The left ventricle has no regional wall motion abnormalities. There is mild left ventricular hypertrophy. Left ventricular diastolic parameters were normal.   2. Right ventricular systolic function is normal. The right ventricular size is normal.   3. The mitral valve is normal in structure. No evidence of mitral valve regurgitation. No evidence of mitral stenosis.   4. The aortic valve is normal in structure. Aortic valve regurgitation is not visualized. No aortic stenosis is present.   5. The inferior vena cava is normal in size with greater than 50% respiratory variability, suggesting right atrial pressure of 3 mmHg.   Cardiac monitor 07/19/20: Patch Wear Time:  7 days and 17 hours (2022-02-12T16:36:22-0500 to 2022-02-20T10:06:03-499)   Patient had a min HR of 55 bpm, max HR of 135 bpm, and avg HR of 88 bpm. Predominant underlying rhythm was Sinus  Rhythm. No Isolated SVEs, SVE Couplets, or SVE Triplets were present. Isolated VEs were rare (<1.0%), VE Couplets were rare (<1.0%), and no  VE Triplets were present.   Left Heart Cath 06/06/2020:   1. Severe 2 vessel obstructive CAD    - 99% proximal LAD    - 90% distal RCA 2. Severe LV dysfunction. EF estimated at 25-30%. Segmental wall motion abnormalities. 3. Moderately elevated LVEDP 4. Successful PCI of the proximal LAD with IVUS guidance and DES x 1 5. Successful PCI of the distal RCA with IVUS guidance and DES x 1  EKG:  08/09/21: Sinus tachycardia, HR 108; anterior infarct 10/10/20: Sinus tachycardia, rate: 109, poor r-wave progression, inferior Q waves, no changes from his previous visit    Recent Labs: 05/01/2022: TSH 1.960  06/30/2022: ALT 42; BUN 14; Creatinine, Ser 1.29; Hemoglobin 17.3; Platelets 280; Potassium 4.1; Sodium 141  Recent Lipid Panel    Component Value Date/Time   CHOL 115 01/29/2022 0809   TRIG 204 (H) 01/29/2022 0809   HDL 27 (L) 01/29/2022 0809   CHOLHDL 4.3 01/29/2022 0809   CHOLHDL 7.2 02/25/2021 0139   VLDL UNABLE TO CALCULATE IF TRIGLYCERIDE OVER 400 mg/dL 02/25/2021 0139   LDLCALC 54 01/29/2022 0809   LDLDIRECT 42.4 02/25/2021 0139     Physical Exam:     VS:  There were no vitals taken for this visit.    Wt Readings from Last 3 Encounters:  06/30/22 240 lb (108.9 kg)  03/19/22 275 lb (124.7 kg)  11/28/21 258 lb (117 kg)     GEN:  Well nourished, well developed in no acute distress HEENT: Normal NECK: No JVD; No carotid bruits CARDIAC: RRR, no murmurs RESPIRATORY:  Clear to auscultation without rales, wheezing or rhonchi  ABDOMEN: Soft, non-tender, non-distended MUSCULOSKELETAL:  No edema; No deformity  SKIN: Warm and dry NEUROLOGIC:  Alert and oriented x 3 PSYCHIATRIC:  Normal affect   ASSESSMENT:    No diagnosis found.    PLAN:    In order of problems listed above:  #NSTEMI: #Multivessel CAD: Patient presented [facility]  [facility]06/20 with significant chest pain found to have elevated troponin c/w NSTEMI. Underwent coronary angiography which revealed 99% proximal LAD and 90% distal RCA both of which were treated successfuly with DES. LV gram with LVEF 25-30%. Left AMA after that hospitalization. Repeat TTEs (latest 04/2021) with recovered EF 60-65%. Has had numerous subsequent ER visits for chest pain. Repeat cath in 02/2021 with stable disease, patent LAD stent and RCA stent. Recommended for continued medical management. -TTE with recovered EF 60-65%, no significant WMA -Stop ASA and change to plavix 77m daily -Continue coreg 228mBID -Continue lipitor 8012maily -Continue spironolactone 12.5mg4mily -Continue jardiance 10mg36mly -Off lisinopril for cough -Remains off tobacco**** -Continue lifestyle modifications   #Ischemic Cardiomyopathy: #Chronic Systolic heart faillure with improved EF EF approximately 25-30% on LV gram-->improved to 65-70% on TTE on 08/12/20. Euvolemic on examination. Currently with NYHA class II symptoms.  -TTE with recovered EF 60-65% -Off lisinopril for cough -Continue coreg 25mg 77mas above -Continue spironolactone 12.5mg da57m and jardiance 10mg da59mas above -Low Na diet  #Mixed HLD: #Hypertriglyceridemia: Followed by lipid clinic. -Continue lipitor 80mg dai65mvascepa 2gBID, fenofibrate 145mg dail15mSyncope: Resolved after stopping lisinopril for cough. No further episodes. -Resolved after stopping lisinopril   #Palpitations: Improved. Likely related to nicotine patch and levothyroxine. Cardiac monitor without significant ectopy. Rare PVCs.  -Continue coreg 25mg BID  44mII: A1C improving 12.8>8.1. -Follow-up with Endocrinology as scheduled -Continue metformin 1000mg daily 61mtinue jardiance 25mg daily -83minue insulin  #Fatty Liver Disease: -Repeat hepatic panel -Continue management of HLD and DMII as above   #OSA not on CPAP: -Follow-up with Pulm    #Tobacco abuse> Successfully QUIT: -Congratulated him on quitting!! -Continue nicotine patches   #Schoizoaffective disorder: #Depression: -Management per psych  #Low Back Pain: Follows with NSGY.  Medication Adjustments/Labs and Tests Ordered: Current medicines are reviewed at length with the patient today.  Concerns regarding medicines are outlined above.  No orders of the defined types were placed in this encounter.  No orders of the defined types were placed in this encounter.   There are no Patient Instructions on file for this visit.    Signed, Dearl Rudden E PemFreada Bergeron  MD  06/30/2022 1:53 PM    [facility]

## 2022-06-30 NOTE — Assessment & Plan Note (Addendum)
Home meds: perphenazine 16 mg AM and 32 mg PM and lithium carbonate 900 mg daily, deutetrabenazine 24 mg daily for TD.  He reports taking lithium as prescribed but level returned subtherapeutic. - Consult psych, appreciate recommendations on medication management  - continue home perphanzine, lithium, and duloxetine - hold deutetrabenazine

## 2022-06-30 NOTE — ED Provider Notes (Signed)
[facility] Provider Note   CSN: [id number] Arrival date & time: 06/30/22  1113     History  Chief Complaint  Patient presents with   Altered Mental Status    Matthew [name] is a 47 y.o. male.  Patient here with altered mental status.  Level 5 caveat as patient is sleepy and somnolent.  He has been with EMS for about an hour and he has been sleepy hard to arouse but he will answer questions.  EMS was called out as neighbors found him with slurred speech and being lethargic.  EMS states there were pill bottles all over the household.  Looks like he lives by himself.  Patient can wake up for a little bit but then falls back asleep.  Moves all extremities per EMS.  The history is provided by the patient.       Home Medications Prior to Admission medications   Medication Sig Start Date End Date Taking? Authorizing Provider  aspirin EC 81 MG tablet Take 1 tablet (81 mg total) by mouth daily. Swallow whole. 06/07/20   Freada Bergeron, MD  atomoxetine (STRATTERA) 80 MG capsule Take 1 capsule (80 mg total) by mouth daily. 05/30/22   Addison Lank, PA-C  atorvastatin (LIPITOR) 80 MG tablet Take 1 tablet (80 mg total) by mouth every morning. 08/15/21   Freada Bergeron, MD  Boswellia-Glucosamine-Vit D (OSTEO BI-FLEX ONE PER DAY) TABS Take 1 tablet by mouth daily.    [provider]  carvedilol (COREG) 25 MG tablet Take 1.5 tablets (37.5 mg total) by mouth 2 (two) times daily. 05/31/21   Lenna Sciara, NP  clopidogrel (PLAVIX) 75 MG tablet Take 1 tablet (75 mg total) by mouth daily. 11/10/21   Freada Bergeron, MD  Coenzyme Q10 (CO Q-10 PO) Take 1 capsule by mouth daily.    [provider]  Continuous Blood Gluc Sensor (DEXCOM G7 SENSOR) MISC 1 Device by Does not apply route as directed. 03/19/22   Shamleffer, Melanie Crazier, MD  Deutetrabenazine ER (AUSTEDO XR) 24 MG TB24 Take 24 mg by mouth daily. 04/20/22   Donnal Moat T, PA-C  DULoxetine (CYMBALTA) 30 MG capsule Take 1 capsule (30 mg total) by mouth daily. Take 1 with the 60 mg=90 mg daily 03/21/22   Donnal Moat T, PA-C  DULoxetine (CYMBALTA) 60 MG capsule Take 1 capsule (60 mg total) by mouth daily. Take with 30 mg=90 mg daily. 03/21/22   Addison Lank, PA-C  empagliflozin (JARDIANCE) 25 MG TABS tablet Take 1 tablet (25 mg total) by mouth daily before breakfast. 03/19/22   Shamleffer, Melanie Crazier, MD  fenofibrate (TRICOR) 145 MG tablet Take 1 tablet (145 mg total) by mouth daily. 11/03/21   Freada Bergeron, MD  gabapentin (NEURONTIN) 400 MG capsule 2 capsules in the morning, 1 around lunch, 1 late afternoon, 1 at night for total of 5 daily. 06/21/22   Addison Lank, PA-C  icosapent Ethyl (VASCEPA) 1 g capsule Take 2 capsules (2 g total) by mouth 2 (two) times daily. 12/24/21   Freada Bergeron, MD  insulin glargine (LANTUS SOLOSTAR) 100 UNIT/ML Solostar Pen Inject 25 Units into the skin daily. 03/19/22   Shamleffer, Melanie Crazier, MD  Insulin Pen Needle 32G X 4 MM MISC 1 Device by Does not apply route daily. 03/19/22   Shamleffer, Melanie Crazier, MD  levothyroxine (SYNTHROID) 25 MCG tablet TAKE 1 TABLET IN THE MORNING WITH A GLASS  OF WATER, WAIT 1 HOUR BEFORE TAKING OTHER MEDS OR EATING 05/30/22   Donnal Moat T, PA-C  lithium carbonate (LITHOBID) 300 MG ER tablet Take 3 tablets (900 mg total) by mouth at bedtime. 06/07/22 07/07/22  Donnal Moat T, PA-C  metFORMIN (GLUCOPHAGE) 1000 MG tablet Take 1 tablet (1,000 mg total) by mouth 2 (two) times daily with a meal. 03/19/22   Shamleffer, Melanie Crazier, MD  nicotine (NICODERM CQ - DOSED IN MG/24 HOURS) 14 mg/24hr patch Place 1 patch (14 mg total) onto the skin daily. 06/03/22   Holley Bouche, MD  nitroGLYCERIN (NITROSTAT) 0.4 MG SL tablet Place 1 tablet (0.4 mg total) under the tongue every 5 (five) minutes as needed for chest pain. 08/10/20 10/13/21  Kathyrn Drown D, NP  perphenazine (TRILAFON)  16 MG tablet TAKE 1 TABLET BY MOUTH EVERY MORNING AND 2 TABLETS AT BEDTIME 06/22/22   Hurst, Helene Kelp T, PA-C  pregabalin (LYRICA) 100 MG capsule Take 1 capsule (100 mg total) by mouth 2 (two) times daily. 04/26/22   Mozingo, Berdie Ogren, NP  Pseudoephedrine HCl (SUDAFED 12 HOUR PO) Take 1 tablet by mouth daily as needed (congestion).    [provider]  spironolactone (ALDACTONE) 25 MG tablet Take 0.5 tablets (12.5 mg total) by mouth daily. Patient not taking: Reported on 10/13/2021 07/20/20 07/15/21  Freada Bergeron, MD  traZODone (DESYREL) 100 MG tablet Take 3 tablets (300 mg total) by mouth at bedtime. 06/07/22   Addison Lank, PA-C  TURMERIC CURCUMIN PO Take 1 capsule by mouth daily.    [provider]  zaleplon (SONATA) 10 MG capsule 1 po qhs prn and may repeat 1 for midnocturnal awakening, as long as he has 3 hours left to sleep. 05/30/22   Donnal Moat T, PA-C      Allergies    Gluten meal and Lactose intolerance (gi)    Review of Systems   Review of Systems  Physical Exam Updated Vital Signs BP 139/75   Pulse 99   Resp (!) 21   Ht 6' 3"$  (1.905 m)   Wt 108.9 kg   SpO2 91%   BMI 30.00 kg/m  Physical Exam Vitals and nursing note reviewed.  Constitutional:      General: He is not in acute distress.    Appearance: He is well-developed.     Comments: Somnolent, will arouse to painful stimuli answers some questions  HENT:     Head: Normocephalic and atraumatic.  Eyes:     Extraocular Movements: Extraocular movements intact.     Conjunctiva/sclera: Conjunctivae normal.     Pupils: Pupils are equal, round, and reactive to light.  Cardiovascular:     Rate and Rhythm: Normal rate and regular rhythm.     Pulses: Normal pulses.     Heart sounds: No murmur heard. Pulmonary:     Effort: Pulmonary effort is normal. No respiratory distress.     Breath sounds: Normal breath sounds.  Abdominal:     Palpations: Abdomen is soft.     Tenderness: There is no  abdominal tenderness.  Musculoskeletal:        General: No swelling.     Cervical back: Neck supple.  Skin:    General: Skin is warm and dry.     Capillary Refill: Capillary refill takes less than 2 seconds.  Neurological:     General: No focal deficit present.     Comments: Appears to move all extremities,  Psychiatric:  Mood and Affect: Mood normal.     ED Results / Procedures / Treatments   Labs (all labs ordered are listed, but only abnormal results are displayed) Labs Reviewed  CBC WITH DIFFERENTIAL/PLATELET - Abnormal; Notable for the following components:      Result Value   WBC 11.5 (*)    RBC 5.99 (*)    Neutro Abs 7.8 (*)    Eosinophils Absolute 0.7 (*)    Basophils Absolute 0.2 (*)    All other components within normal limits  COMPREHENSIVE METABOLIC PANEL - Abnormal; Notable for the following components:   Glucose, Bld 189 (*)    Creatinine, Ser 1.29 (*)    All other components within normal limits  ACETAMINOPHEN LEVEL - Abnormal; Notable for the following components:   Acetaminophen (Tylenol), Serum <10 (*)    All other components within normal limits  SALICYLATE LEVEL - Abnormal; Notable for the following components:   Salicylate Lvl [id number] (*)    All other components within normal limits  I-STAT VENOUS BLOOD GAS, ED - Abnormal; Notable for the following components:   pO2, Ven 62 (*)    Hemoglobin 17.3 (*)    All other components within normal limits  RESP PANEL BY RT-PCR (RSV, FLU A&B, COVID)  RVPGX2  LIPASE, BLOOD  ETHANOL  URINALYSIS, ROUTINE W REFLEX MICROSCOPIC  RAPID URINE DRUG SCREEN, HOSP PERFORMED  LITHIUM LEVEL    EKG EKG Interpretation  Date/Time:  Saturday June 30 2022 11:31:22 EST Ventricular Rate:  101 PR Interval:  217 QRS Duration: 103 QT Interval:  340 QTC Calculation: 441 R Axis:   180 Text Interpretation: Sinus tachycardia Confirmed by Lennice Sites ([id number]) on 06/30/2022 11:33:34 AM  Radiology CT Head Wo  Contrast  Result Date: 06/30/2022 CLINICAL DATA:  Mental status change.  Decreased responsiveness. EXAM: CT HEAD WITHOUT CONTRAST TECHNIQUE: Contiguous axial images were obtained from the base of the skull through the vertex without intravenous contrast. RADIATION DOSE REDUCTION: This exam was performed according to the departmental dose-optimization program which includes automated exposure control, adjustment of the mA and/or kV according to patient size and/or use of iterative reconstruction technique. COMPARISON:  06/01/2022. FINDINGS: Brain: No evidence of acute infarction, hemorrhage, hydrocephalus, extra-axial collection or mass lesion/mass effect. Vascular: No hyperdense vessel or unexpected calcification. Skull: Normal. Negative for fracture or focal lesion. Sinuses/Orbits: Globes and orbits are unremarkable. Sinuses are clear. Other: None. IMPRESSION: No acute intracranial abnormalities. No change from the recent prior study. Electronically Signed   By: Lajean Manes M.D.   On: 06/30/2022 13:08   DG Chest Portable 1 View  Result Date: 06/30/2022 CLINICAL DATA:  Fatigue EXAM: PORTABLE CHEST 1 VIEW COMPARISON:  None Available. FINDINGS: The lateral left lung base is poorly evaluated due to portable technique. Within visualized limits, there is stable cardiomegaly. The hila and mediastinum are unchanged. No pneumothorax. No nodules or masses. No other abnormalities. IMPRESSION: 1. The lateral left lung base is poorly evaluated due to portable technique. Recommend PA and lateral chest x-ray. 2. Stable cardiomegaly. 3. No other abnormalities. Electronically Signed   By: Dorise Bullion III M.D.   On: 06/30/2022 11:46    Procedures Procedures    Medications Ordered in ED Medications  sodium chloride 0.9 % bolus 1,000 mL (1,000 mLs Intravenous New Bag/Given 06/30/22 1154)  sodium chloride 0.9 % bolus 1,000 mL (1,000 mLs Intravenous New Bag/Given 06/30/22 1156)    ED Course/ Medical Decision  Making/ A&P  Medical Decision Making Amount and/or Complexity of Data Reviewed Labs: ordered. Radiology: ordered.  Risk Decision regarding hospitalization.   RIP SCHOENBAUER is here with altered mental status.  Normal vitals.  No fever.  Per my chart review patient was admitted for Xanax overdose about a month ago.  This seems like a similar picture.  He arrives somnolent will arouse to painful stimuli.  Moves all extremities.  Blood sugar normal with EMS.  EMS states they found pill bottles all over the house on the floor.  They did not give any Narcan.  Pupils were not pinpoint.  He arrives with what appears to be may be some intoxication.  Differential diagnosis likely polysubstance abuse or medication overdose, seems less likely to be infectious or stroke.  Seems like he filled prescription for alprazolam a couple days ago per my review of narcotic database.  Will do full workup to evaluate for infectious process, neurologic process.  Will check Tylenol, alcohol, salicylate levels in addition to basic labs, chest x-ray and head CT.  Will give fluid bolus.  Per my review and interpretation the labs is no significant anemia or electrolyte abnormality or kidney injury or leukocytosis.  Chest x-ray per my review and interpretation with no evidence of pneumonia.  Head CT is unremarkable per radiology report.  Patient still fairly somnolent.  My suspicion is for benzodiazepine overdose but he is also on multiple sedating medications as well.  He likely will need prolonged metabolization of medications.  Not sure he should be prescribed benzodiazepines and other sedating meds.  Obviously this is a complex issue given his medical history psychiatric history.  I do not have any concern at this time for infectious process.  Metabolic workup looks unremarkable.  Will admit him for observation further care and medicine team.  This chart was dictated using voice recognition  software.  Despite best efforts to proofread,  errors can occur which can change the documentation meaning.         Final Clinical Impression(s) / ED Diagnoses Final diagnoses:  Altered mental status, unspecified altered mental status type    Rx / DC Orders ED Discharge Orders     None         Lennice Sites, DO 06/30/22 1331

## 2022-07-01 DIAGNOSIS — T424X2A Poisoning by benzodiazepines, intentional self-harm, initial encounter: Secondary | ICD-10-CM | POA: Diagnosis not present

## 2022-07-01 DIAGNOSIS — R404 Transient alteration of awareness: Secondary | ICD-10-CM

## 2022-07-01 DIAGNOSIS — F251 Schizoaffective disorder, depressive type: Secondary | ICD-10-CM

## 2022-07-01 DIAGNOSIS — F411 Generalized anxiety disorder: Secondary | ICD-10-CM

## 2022-07-01 DIAGNOSIS — F250 Schizoaffective disorder, bipolar type: Secondary | ICD-10-CM | POA: Diagnosis not present

## 2022-07-01 LAB — CBC
HCT: 44.4 % (ref 39.0–52.0)
Hemoglobin: 15.2 g/dL (ref 13.0–17.0)
MCH: 29 pg (ref 26.0–34.0)
MCHC: 34.2 g/dL (ref 30.0–36.0)
MCV: 84.6 fL (ref 80.0–100.0)
Platelets: 247 10*3/uL (ref 150–400)
RBC: 5.25 MIL/uL (ref 4.22–5.81)
RDW: 14.3 % (ref 11.5–15.5)
WBC: 8.5 10*3/uL (ref 4.0–10.5)
nRBC: 0 % (ref 0.0–0.2)

## 2022-07-01 LAB — BASIC METABOLIC PANEL
Anion gap: 10 (ref 5–15)
BUN: 12 mg/dL (ref 6–20)
CO2: 21 mmol/L — ABNORMAL LOW (ref 22–32)
Calcium: 9 mg/dL (ref 8.9–10.3)
Chloride: 101 mmol/L (ref 98–111)
Creatinine, Ser: 1.17 mg/dL (ref 0.61–1.24)
GFR, Estimated: >60 mL/min (ref >60)
Glucose, Bld: 255 mg/dL — ABNORMAL HIGH (ref 70–99)
Potassium: 4.2 mmol/L (ref 3.5–5.1)
Sodium: 132 mmol/L — ABNORMAL LOW (ref 135–145)

## 2022-07-01 LAB — GLUCOSE, CAPILLARY
Glucose-Capillary: 158 mg/dL — ABNORMAL HIGH (ref 70–99)
Glucose-Capillary: 183 mg/dL — ABNORMAL HIGH (ref 70–99)
Glucose-Capillary: 185 mg/dL — ABNORMAL HIGH (ref 70–99)
Glucose-Capillary: 188 mg/dL — ABNORMAL HIGH (ref 70–99)
Glucose-Capillary: 245 mg/dL — ABNORMAL HIGH (ref 70–99)

## 2022-07-01 MED ORDER — LITHIUM CARBONATE ER 450 MG PO TBCR
900.0000 mg | EXTENDED_RELEASE_TABLET | Freq: Every day | ORAL | Status: DC
  Administered 2022-07-01 (×2): 900 mg via ORAL
  Filled 2022-07-01 (×3): qty 2

## 2022-07-01 MED ORDER — PERPHENAZINE 4 MG PO TABS
32.0000 mg | ORAL_TABLET | Freq: Every day | ORAL | Status: DC
  Administered 2022-07-01: 32 mg via ORAL
  Filled 2022-07-01 (×2): qty 8

## 2022-07-01 MED ORDER — ALPRAZOLAM 0.5 MG PO TABS
1.0000 mg | ORAL_TABLET | Freq: Once | ORAL | Status: AC
  Administered 2022-07-01: 1 mg via ORAL
  Filled 2022-07-01: qty 2

## 2022-07-01 MED ORDER — INSULIN ASPART 100 UNIT/ML IJ SOLN
0.0000 [IU] | Freq: Three times a day (TID) | INTRAMUSCULAR | Status: DC
  Administered 2022-07-01 (×2): 3 [IU] via SUBCUTANEOUS
  Administered 2022-07-01 – 2022-07-02 (×3): 5 [IU] via SUBCUTANEOUS

## 2022-07-01 MED ORDER — PERPHENAZINE 4 MG PO TABS
16.0000 mg | ORAL_TABLET | Freq: Every day | ORAL | Status: DC
  Administered 2022-07-01 – 2022-07-02 (×2): 16 mg via ORAL
  Filled 2022-07-01 (×2): qty 4

## 2022-07-01 MED ORDER — INSULIN GLARGINE-YFGN 100 UNIT/ML ~~LOC~~ SOLN
10.0000 [IU] | Freq: Every morning | SUBCUTANEOUS | Status: DC
  Administered 2022-07-01 – 2022-07-02 (×2): 10 [IU] via SUBCUTANEOUS
  Filled 2022-07-01 (×2): qty 0.1

## 2022-07-01 MED ORDER — CLONAZEPAM 0.5 MG PO TABS
0.5000 mg | ORAL_TABLET | Freq: Two times a day (BID) | ORAL | Status: DC | PRN
  Administered 2022-07-01 – 2022-07-02 (×2): 0.5 mg via ORAL
  Filled 2022-07-01 (×2): qty 1

## 2022-07-01 NOTE — Plan of Care (Signed)
  Problem: Education: Goal: Knowledge of General Education information will improve Description: Including pain rating scale, medication(s)/side effects and non-pharmacologic comfort measures Outcome: Progressing   Problem: Health Behavior/Discharge Planning: Goal: Ability to manage health-related needs will improve Outcome: Progressing   Problem: Clinical Measurements: Goal: Ability to maintain clinical measurements within normal limits will improve Outcome: Progressing Goal: Will remain free from infection Outcome: Progressing Goal: Diagnostic test results will improve Outcome: Progressing Goal: Respiratory complications will improve Outcome: Progressing Goal: Cardiovascular complication will be avoided Outcome: Progressing   Problem: Activity: Goal: Risk for activity intolerance will decrease Outcome: Progressing   Problem: Nutrition: Goal: Adequate nutrition will be maintained Outcome: Progressing   Problem: Coping: Goal: Level of anxiety will decrease Outcome: Progressing   Problem: Elimination: Goal: Will not experience complications related to bowel motility Outcome: Progressing Goal: Will not experience complications related to urinary retention Outcome: Progressing   Problem: Pain Managment: Goal: General experience of comfort will improve Outcome: Progressing   Problem: Safety: Goal: Ability to remain free from injury will improve Outcome: Progressing   Problem: Skin Integrity: Goal: Risk for impaired skin integrity will decrease Outcome: Progressing   Problem: Education: Goal: Ability to describe self-care measures that may prevent or decrease complications (Diabetes Survival Skills Education) will improve Outcome: Progressing Goal: Individualized Educational Video(s) Outcome: Progressing   Problem: Coping: Goal: Ability to adjust to condition or change in health will improve Outcome: Progressing   Problem: Fluid Volume: Goal: Ability to  maintain a balanced intake and output will improve Outcome: Progressing   Problem: Health Behavior/Discharge Planning: Goal: Ability to identify and utilize available resources and services will improve Outcome: Progressing Goal: Ability to manage health-related needs will improve Outcome: Progressing   Problem: Metabolic: Goal: Ability to maintain appropriate glucose levels will improve Outcome: Progressing   Problem: Nutritional: Goal: Maintenance of adequate nutrition will improve Outcome: Progressing Goal: Progress toward achieving an optimal weight will improve Outcome: Progressing   Problem: Skin Integrity: Goal: Risk for impaired skin integrity will decrease Outcome: Progressing   Problem: Tissue Perfusion: Goal: Adequacy of tissue perfusion will improve Outcome: Progressing   Problem: Education: Goal: Ability to describe self-care measures that may prevent or decrease complications (Diabetes Survival Skills Education) will improve Outcome: Progressing Goal: Individualized Educational Video(s) Outcome: Progressing   Problem: Coping: Goal: Ability to adjust to condition or change in health will improve Outcome: Progressing   Problem: Fluid Volume: Goal: Ability to maintain a balanced intake and output will improve Outcome: Progressing   Problem: Health Behavior/Discharge Planning: Goal: Ability to identify and utilize available resources and services will improve Outcome: Progressing Goal: Ability to manage health-related needs will improve Outcome: Progressing   Problem: Metabolic: Goal: Ability to maintain appropriate glucose levels will improve Outcome: Progressing   Problem: Nutritional: Goal: Maintenance of adequate nutrition will improve Outcome: Progressing Goal: Progress toward achieving an optimal weight will improve Outcome: Progressing   Problem: Skin Integrity: Goal: Risk for impaired skin integrity will decrease Outcome: Progressing    Problem: Tissue Perfusion: Goal: Adequacy of tissue perfusion will improve Outcome: Progressing   

## 2022-07-01 NOTE — Consult Note (Addendum)
[facility] Face-to-Face Psychiatry Consult   Reason for Consult:''AMS due to suspected bezodiazepine overdose. Hx of xanax overdose in January. Hx of schizoaffective disorder Concern for SI as cause of overdose. This is now 2nd in 1 month.'' Referring Physician:  Yehuda Savannah, MD Patient Identification: Matthew [name] MRN:  [id number] Principal Diagnosis: Altered mental status Diagnosis:  Principal Problem:   Altered mental status Active Problems:   OSA (obstructive sleep apnea)   Schizoaffective disorder, bipolar type without good prognostic features ([place])   Generalized anxiety disorder   Type 2 diabetes mellitus with diabetic polyneuropathy, with long-term current use of insulin ([place])   Intentional overdose of alprazolam ([place])   Insomnia   Hypothyroid   Total Time spent with patient: 1 hour  Subjective:   Matthew [name] is a 47 y.o. male patient admitted with altered mental status due to benzodiazepine overdose.  HPI:  47 y.o. male with history of schizoaffective disorder, generalized anxiety disorder who was brought to the ED by EMS activated by his neighbor due to altered mental status secondary to intentional benzodiazepine overdose. Patient was brought to the ED in January, 2024 after he overdosed on the same medications. He reports being depressed, social isolated, hopeless and decided to take more benzodiazepine so that he can sleep and not wake up. Patient denies psychosis, delusions and homicidal thoughts. He is clearly a danger to himself having intentional attempted twice in less than a month. He meets the criteria for inpatient psychiatric admission after he is medically cleared.  Past Psychiatric History: as above  Risk to Self:  suicidal Risk to Others:  denies Prior Inpatient Therapy:  yes Prior Outpatient Therapy:  yes  Past Medical History:  Past Medical History:  Diagnosis Date   Anxiety    Arthritis    Celiac disease    Coronary artery disease    Dairy allergy     Depression    Diabetes mellitus without complication (HCC)    High cholesterol    Hypertension    Hypothyroidism    Myocardial infarction ([place])    Paranoia ([place])    Schizoaffective disorder ([place])     Past Surgical History:  Procedure Laterality Date   APPENDECTOMY     CARDIAC CATHETERIZATION     CORONARY STENT INTERVENTION N/A 06/06/2020   Procedure: CORONARY STENT INTERVENTION;  Surgeon: Martinique, Peter M, MD;  Location: [facility];  Service: Cardiovascular;  Laterality: N/A;  prox LAD, distal RCA   INTRAVASCULAR ULTRASOUND/IVUS N/A 06/06/2020   Procedure: Intravascular Ultrasound/IVUS;  Surgeon: Martinique, Peter M, MD;  Location: [facility];  Service: Cardiovascular;  Laterality: N/A;   LEFT HEART CATH AND CORONARY ANGIOGRAPHY N/A 06/06/2020   Procedure: LEFT HEART CATH AND CORONARY ANGIOGRAPHY;  Surgeon: Martinique, Peter M, MD;  Location: [facility];  Service: Cardiovascular;  Laterality: N/A;   LEFT HEART CATH AND CORONARY ANGIOGRAPHY N/A 02/24/2021   Procedure: LEFT HEART CATH AND CORONARY ANGIOGRAPHY;  Surgeon: Belva Crome, MD;  Location: [facility];  Service: Cardiovascular;  Laterality: N/A;   NASAL SEPTUM SURGERY     TOTAL SHOULDER ARTHROPLASTY Right 11/28/2021   Procedure: TOTAL SHOULDER ARTHROPLASTY;  Surgeon: Marchia Bond, MD;  Location: [facility];  Service: Orthopedics;  Laterality: Right;   WRIST FRACTURE SURGERY     right wrist   Family History:  Family History  Problem Relation Age of Onset   Asthma Mother    Hypertension Mother    Hypertension Father  Family Psychiatric  History:   Social History:  Social History   Substance and Sexual Activity  Alcohol Use No   Alcohol/week: 0.0 standard drinks of alcohol   Comment: no (02/08/15)     Social History   Substance and Sexual Activity  Drug Use No    Social History   Socioeconomic History   Marital status: Single    Spouse name: Not on file   Number of children: 0    Years of education: 14   Highest education level: Associate degree: occupational, Hotel manager, or vocational program  Occupational History   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, multimedia, unemployed  Tobacco Use   Smoking status: Some Days    Packs/day: 1.00    Types: Cigarettes, E-cigarettes    Last attempt to quit: 10/19/2021    Years since quitting: 0.6   Smokeless tobacco: Never  Vaping Use   Vaping Use: Former  Substance and Sexual Activity   Alcohol use: No    Alcohol/week: 0.0 standard drinks of alcohol    Comment: no (02/08/15)   Drug use: No   Sexual activity: Not on file  Other Topics Concern   Not on file  Social History Narrative   Pt lives at the [place] Determinants of Health   Financial Resource Strain: Not on file  Food Insecurity: Not on file  Transportation Needs: Not on file  Physical Activity: Not on file  Stress: Not on file  Social Connections: Not on file   Additional Social History:    Allergies:   Allergies  Allergen Reactions   Gluten Meal Other (See Comments)    Celiac disease   Lactose Intolerance (Gi) Diarrhea    Can tolerate hard cheese    Labs:  Results for orders placed or performed during the hospital encounter of 06/30/22 (from the past 48 hour(s))  Resp panel by RT-PCR (RSV, Flu A&B, Covid) Anterior Nasal Swab     Status: None   Collection Time: 06/30/22 11:19 AM   Specimen: Anterior Nasal Swab  Result Value Ref Range   SARS Coronavirus 2 by RT PCR NEGATIVE NEGATIVE   Influenza A by PCR NEGATIVE NEGATIVE   Influenza B by PCR NEGATIVE NEGATIVE    Comment: (NOTE) The Xpert Xpress SARS-CoV-2/FLU/RSV plus assay is intended as an aid in the diagnosis of influenza from Nasopharyngeal swab specimens and should not be used as a sole basis for treatment. Nasal washings and aspirates are unacceptable for Xpert Xpress SARS-CoV-2/FLU/RSV testing.  Fact Sheet for Patients: EntrepreneurPulse.com.au  Fact Sheet for Healthcare  Providers: IncredibleEmployment.be  This test is not yet approved or cleared by the [place] FDA and has been authorized for detection and/or diagnosis of SARS-CoV-2 by FDA under an Emergency Use Authorization (EUA). This EUA will remain in effect (meaning this test can be used) for the duration of the COVID-19 declaration under Section 564(b)(1) of the Act, 21 U.S.C. section 360bbb-3(b)(1), unless the authorization is terminated or revoked.     Resp Syncytial Virus by PCR NEGATIVE NEGATIVE    Comment: (NOTE) Fact Sheet for Patients: EntrepreneurPulse.com.au  Fact Sheet for Healthcare Providers: IncredibleEmployment.be  This test is not yet approved or cleared by the [place] FDA and has been authorized for detection and/or diagnosis of SARS-CoV-2 by FDA under an Emergency Use Authorization (EUA). This EUA will remain in effect (meaning this test can be used) for the duration of the COVID-19 declaration under Section 564(b)(1) of the Act, 21 U.S.C. section 360bbb-3(b)(1), unless the authorization  is terminated or revoked.  Performed at [facility], [place] [address], [place], [place] [address]   CBC with Differential     Status: Abnormal   Collection Time: 06/30/22 11:45 AM  Result Value Ref Range   WBC 11.5 (H) 4.0 - 10.5 K/uL   RBC 5.99 (H) 4.22 - 5.81 MIL/uL   Hemoglobin 17.0 13.0 - 17.0 g/dL   HCT 50.8 39.0 - 52.0 %   MCV 84.8 80.0 - 100.0 fL   MCH 28.4 26.0 - 34.0 pg   MCHC 33.5 30.0 - 36.0 g/dL   RDW 14.0 11.5 - 15.5 %   Platelets 280 150 - 400 K/uL   nRBC 0.0 0.0 - 0.2 %   Neutrophils Relative % 69 %   Neutro Abs 7.8 (H) 1.7 - 7.7 K/uL   Lymphocytes Relative 18 %   Lymphs Abs 2.1 0.7 - 4.0 K/uL   Monocytes Relative 5 %   Monocytes Absolute 0.6 0.1 - 1.0 K/uL   Eosinophils Relative 6 %   Eosinophils Absolute 0.7 (H) 0.0 - 0.5 K/uL   Basophils Relative 2 %   Basophils Absolute 0.2 (H) 0.0 -  0.1 K/uL   Immature Granulocytes 0 %   Abs Immature Granulocytes 0.04 0.00 - 0.07 K/uL    Comment: Performed at [facility], [place] [address] [place], [place] [address]  Comprehensive metabolic panel     Status: Abnormal   Collection Time: 06/30/22 11:45 AM  Result Value Ref Range   Sodium 138 135 - 145 mmol/L   Potassium 4.0 3.5 - 5.1 mmol/L   Chloride 104 98 - 111 mmol/L   CO2 22 22 - 32 mmol/L   Glucose, Bld 189 (H) 70 - 99 mg/dL    Comment: Glucose reference range applies only to samples taken after fasting for at least 8 hours.   BUN 14 6 - 20 mg/dL   Creatinine, Ser 1.29 (H) 0.61 - 1.24 mg/dL   Calcium 9.6 8.9 - 10.3 mg/dL   Total Protein 7.7 6.5 - 8.1 g/dL   Albumin 4.7 3.5 - 5.0 g/dL   AST 29 15 - 41 U/L   ALT 42 0 - 44 U/L   Alkaline Phosphatase 79 38 - 126 U/L   Total Bilirubin 0.5 0.3 - 1.2 mg/dL   GFR, Estimated >60 >60 mL/min    Comment: (NOTE) Calculated using the CKD-EPI Creatinine Equation (2021)    Anion gap 12 5 - 15    Comment: Performed at [facility] [address] [place], [place] [address]  Lipase, blood     Status: None   Collection Time: 06/30/22 11:45 AM  Result Value Ref Range   Lipase 28 11 - 51 U/L    Comment: Performed at [facility] [address] [place], [place] [address]  Ethanol     Status: None   Collection Time: 06/30/22 11:45 AM  Result Value Ref Range   Alcohol, Ethyl (B) <10 <10 mg/dL    Comment: (NOTE) Lowest detectable limit for serum alcohol is 10 mg/dL.  For medical purposes only. Performed at [facility], [place] [address] [place], [place] [address]   Acetaminophen level     Status: Abnormal   Collection Time: 06/30/22 11:45 AM  Result Value Ref Range   Acetaminophen (Tylenol), Serum <10 (L) 10 - 30 ug/mL    Comment: (NOTE) Therapeutic concentrations vary significantly. A range of 10-30 ug/mL  may be an effective concentration  for many patients. However, some  are best treated at  concentrations outside of this range. Acetaminophen concentrations >150 ug/mL at 4 hours after ingestion  and >50 ug/mL at 12 hours after ingestion are often associated with  toxic reactions.  Performed at [facility], [place] [address], [place], [place] [id number]   Salicylate level     Status: Abnormal   Collection Time: 06/30/22 11:45 AM  Result Value Ref Range   Salicylate Lvl [id number] (L) 7.0 - 30.0 mg/dL    Comment: Performed at [facility] [address] [place], [place] [address]  I-Stat venous blood gas, [facility] ED, [facility], [facility])     Status: Abnormal   Collection Time: 06/30/22 11:53 AM  Result Value Ref Range   pH, Ven 7.393 7.25 - 7.43   pCO2, Ven 44.7 44 - 60 mmHg   pO2, Ven 62 (H) 32 - 45 mmHg   Bicarbonate 27.2 20.0 - 28.0 mmol/L   TCO2 29 22 - 32 mmol/L   O2 Saturation 91 %   Acid-Base Excess 2.0 0.0 - 2.0 mmol/L   Sodium 141 135 - 145 mmol/L   Potassium 4.1 3.5 - 5.1 mmol/L   Calcium, Ion 1.17 1.15 - 1.40 mmol/L   HCT 51.0 39.0 - 52.0 %   Hemoglobin 17.3 (H) 13.0 - 17.0 g/dL   Sample type VENOUS   Urinalysis, Routine w reflex microscopic -Urine, Clean Catch     Status: Abnormal   Collection Time: 06/30/22  1:38 PM  Result Value Ref Range   Color, Urine YELLOW YELLOW   APPearance CLEAR CLEAR   Specific Gravity, Urine 1.028 1.005 - 1.030   pH 6.0 5.0 - 8.0   Glucose, UA >=500 (A) NEGATIVE mg/dL   Hgb urine dipstick NEGATIVE NEGATIVE   Bilirubin Urine NEGATIVE NEGATIVE   Ketones, ur NEGATIVE NEGATIVE mg/dL   Protein, ur NEGATIVE NEGATIVE mg/dL   Nitrite NEGATIVE NEGATIVE   Leukocytes,Ua NEGATIVE NEGATIVE   RBC / HPF 0-5 0 - 5 RBC/hpf   WBC, UA 0-5 0 - 5 WBC/hpf   Bacteria, UA NONE SEEN NONE SEEN   Squamous Epithelial / HPF 0-5 0 - 5 /HPF    Comment: Performed at [facility], [place] [address], [place], [place] [address]  Rapid urine drug screen (hospital performed)     Status: Abnormal   Collection Time: 06/30/22  1:38 PM  Result Value Ref Range    Opiates NONE DETECTED NONE DETECTED   Cocaine NONE DETECTED NONE DETECTED   Benzodiazepines POSITIVE (A) NONE DETECTED   Amphetamines NONE DETECTED NONE DETECTED   Tetrahydrocannabinol NONE DETECTED NONE DETECTED   Barbiturates NONE DETECTED NONE DETECTED    Comment: (NOTE) DRUG SCREEN FOR MEDICAL PURPOSES ONLY.  IF CONFIRMATION IS NEEDED FOR ANY PURPOSE, NOTIFY LAB WITHIN 5 DAYS.  LOWEST DETECTABLE LIMITS FOR URINE DRUG SCREEN Drug Class                     Cutoff (ng/mL) Amphetamine and metabolites    1000 Barbiturate and metabolites    200 Benzodiazepine                 200 Opiates and metabolites        300 Cocaine and metabolites        300 THC                            50  Performed at [facility], [place] [address] [place], [place] [address]   Lithium level     Status: Abnormal   Collection Time: 06/30/22  5:19 PM  Result Value Ref Range   Lithium Lvl 0.28 (L) 0.60 - 1.20 mmol/L    Comment: Performed at [facility] [address] [place], [place] [address]  TSH     Status: None   Collection Time: 06/30/22  5:19 PM  Result Value Ref Range   TSH 0.498 0.350 - 4.500 uIU/mL    Comment: Performed by a 3rd Generation assay with a functional sensitivity of <=0.01 uIU/mL. Performed at [facility], [place] [address], [place], [place] [address]   Hemoglobin A1c     Status: Abnormal   Collection Time: 06/30/22  5:19 PM  Result Value Ref Range   Hgb A1c MFr Bld 8.3 (H) 4.8 - 5.6 %    Comment: (NOTE) Pre diabetes:          5.7%-6.4%  Diabetes:              >6.4%  Glycemic control for   <7.0% adults with diabetes    Mean Plasma Glucose 191.51 mg/dL    Comment: Performed at [facility] [address], [place], [place] [address]  Glucose, capillary     Status: Abnormal   Collection Time: 06/30/22  6:39 PM  Result Value Ref Range   Glucose-Capillary 142 (H) 70 - 99 mg/dL    Comment: Glucose reference range applies only to samples taken after  fasting for at least 8 hours.  Glucose, capillary     Status: Abnormal   Collection Time: 06/30/22  8:12 PM  Result Value Ref Range   Glucose-Capillary 153 (H) 70 - 99 mg/dL    Comment: Glucose reference range applies only to samples taken after fasting for at least 8 hours.  Glucose, capillary     Status: Abnormal   Collection Time: 06/30/22 11:33 PM  Result Value Ref Range   Glucose-Capillary 251 (H) 70 - 99 mg/dL    Comment: Glucose reference range applies only to samples taken after fasting for at least 8 hours.  Basic metabolic panel     Status: Abnormal   Collection Time: 07/01/22  7:32 AM  Result Value Ref Range   Sodium 132 (L) 135 - 145 mmol/L   Potassium 4.2 3.5 - 5.1 mmol/L    Comment: HEMOLYSIS AT THIS LEVEL MAY AFFECT RESULT   Chloride 101 98 - 111 mmol/L   CO2 21 (L) 22 - 32 mmol/L   Glucose, Bld 255 (H) 70 - 99 mg/dL    Comment: Glucose reference range applies only to samples taken after fasting for at least 8 hours.   BUN 12 6 - 20 mg/dL   Creatinine, Ser 1.17 0.61 - 1.24 mg/dL   Calcium 9.0 8.9 - 10.3 mg/dL   GFR, Estimated >60 >60 mL/min    Comment: (NOTE) Calculated using the CKD-EPI Creatinine Equation (2021)    Anion gap 10 5 - 15    Comment: Performed at [facility] [address] [place] [address]  CBC     Status: None   Collection Time: 07/01/22  7:32 AM  Result Value Ref Range   WBC 8.5 4.0 - 10.5 K/uL   RBC 5.25 4.22 - 5.81 MIL/uL   Hemoglobin 15.2 13.0 - 17.0 g/dL   HCT 44.4 39.0 - 52.0 %   MCV 84.6 80.0 -  100.0 fL   MCH 29.0 26.0 - 34.0 pg   MCHC 34.2 30.0 - 36.0 g/dL   RDW 14.3 11.5 - 15.5 %   Platelets 247 150 - 400 K/uL   nRBC 0.0 0.0 - 0.2 %    Comment: Performed at [facility] [address] [place], [place] [address]  Glucose, capillary     Status: Abnormal   Collection Time: 07/01/22  7:57 AM  Result Value Ref Range   Glucose-Capillary 245 (H) 70 - 99 mg/dL    Comment: Glucose reference range applies only to  samples taken after fasting for at least 8 hours.  Glucose, capillary     Status: Abnormal   Collection Time: 07/01/22 11:41 AM  Result Value Ref Range   Glucose-Capillary 185 (H) 70 - 99 mg/dL    Comment: Glucose reference range applies only to samples taken after fasting for at least 8 hours.    Current Facility-Administered Medications  Medication Dose Route Frequency Provider Last Rate Last Admin   aspirin EC tablet 81 mg  81 mg Oral Daily Leslie Dales, DO   81 mg at 07/01/22 0825   atorvastatin (LIPITOR) tablet 80 mg  80 mg Oral q morning Leslie Dales, DO   80 mg at 07/01/22 0824   carvedilol (COREG) tablet 37.5 mg  37.5 mg Oral BID Leslie Dales, DO   37.5 mg at 07/01/22 0825   clopidogrel (PLAVIX) tablet 75 mg  75 mg Oral Daily Leslie Dales, DO   75 mg at 07/01/22 0825   DULoxetine (CYMBALTA) DR capsule 90 mg  90 mg Oral Daily Leslie Dales, DO   90 mg at 07/01/22 0825   enoxaparin (LOVENOX) injection 40 mg  40 mg Subcutaneous Q24H Leslie Dales, DO   40 mg at 06/30/22 2013   fenofibrate tablet 160 mg  160 mg Oral Daily Leslie Dales, DO   160 mg at 07/01/22 0825   icosapent Ethyl (VASCEPA) 1 g capsule 2 g  2 g Oral BID Leslie Dales, DO   2 g at 07/01/22 [id number]   insulin aspart (novoLOG) injection 0-15 Units  0-15 Units Subcutaneous TID WC Espinoza, Alejandra, DO   3 Units at 07/01/22 1238   insulin glargine-yfgn (SEMGLEE) injection 10 Units  10 Units Subcutaneous q morning Sharion Settler, DO   10 Units at 07/01/22 [id number]   levothyroxine (SYNTHROID) tablet 25 mcg  25 mcg Oral q AM Leslie Dales, DO   25 mcg at 07/01/22 0530   lithium carbonate (ESKALITH) ER tablet 900 mg  900 mg Oral QHS Espinoza, Alejandra, DO   900 mg at 07/01/22 0135   nicotine (NICODERM CQ - dosed in mg/24 hours) patch 14 mg  14 mg Transdermal Daily PRN Leslie Dales, DO       perphenazine (TRILAFON) tablet 16 mg  16 mg Oral Daily Lenoria Chime, MD   16 mg at 07/01/22 0825   And    perphenazine (TRILAFON) tablet 32 mg  32 mg Oral QHS Lenoria Chime, MD        Musculoskeletal: Strength & Muscle Tone: within normal limits Gait & Station: normal Patient leans: N/A    Psychiatric Specialty Exam:  Presentation  General Appearance:  Appropriate for Environment  Eye Contact: Good  Speech: Clear and Coherent  Speech Volume: Normal  Handedness: Right   Mood and Affect  Mood: Depressed; Anxious  Affect: Congruent   Thought Process  Thought Processes: Goal Directed  Descriptions of Associations:Intact  Orientation:Full (Time,  Place and Person)  Thought Content:Logical  History of Schizophrenia/Schizoaffective disorder:No data recorded Duration of Psychotic Symptoms:No data recorded Hallucinations:Hallucinations: None  Ideas of Reference:None  Suicidal Thoughts:Suicidal Thoughts: Yes, Active SI Active Intent and/or Plan: With Plan  Homicidal Thoughts:Homicidal Thoughts: No   Sensorium  Memory: Immediate Good; Recent Good; Remote Good  Judgment: Poor  Insight: Lacking   Executive Functions  Concentration: Fair  Attention Span: Fair  Recall: Good  Fund of Knowledge: Good  Language: Good   Psychomotor Activity  Psychomotor Activity: Psychomotor Activity: Increased   Assets  Assets: Communication Skills; Desire for Improvement   Sleep  Sleep: Sleep: Fair   Physical Exam: Physical Exam Review of Systems  Psychiatric/Behavioral:  Positive for depression, substance abuse and suicidal ideas.    Blood pressure (!) 133/108, pulse 93, temperature 98.2 F (36.8 C), temperature source Oral, resp. rate 19, height 6' 3"$  (1.905 m), weight 108.9 kg, SpO2 97 %. Body mass index is 30 kg/m.  Treatment Plan Summary: 47 y/o male with long history of mental illness who was admitted after he intentionally overdosed on Benzodiazepine twice in a month. Patient is unable to consent for safety, at this time he is a  danger to himself and will benefit from inpatient psych admission.  Plan/Recommendations -Continue 1:1 sitter for safety -Continue Cymbalta, Lithium and [name] -Add Clonazepam 0.5 mg, 1 tab twice daily as needed for anxiety,agitation and benzo withdrawal -Consider social worker consult to facilitate inpatient psych admission  Disposition: Recommend psychiatric Inpatient admission when medically cleared. Supportive therapy provided about ongoing stressors. Psychiatric consult service will follow up with this patient  Corena Pilgrim, MD 07/01/2022 3:22 PM

## 2022-07-01 NOTE — Discharge Instructions (Signed)
Dear Matthew [name],  Thank you for letting us participate in your care. You were hospitalized for altered mental status which we suspected was a result of medication side effects. You were observed overnight and improved. You were seen by Psychiatry during this hospitalization. We are glad that you are feeling [name]  POST-HOSPITAL & CARE INSTRUCTIONS We would recommend that you work with your physician to wean your Xanax Follow up with your Psychiatrist outpatient Work with your physician to cut back on smoking. Tobacco is harmful to your body. Go to your follow up appointments (listed below)   DOCTOR'S APPOINTMENT   Future Appointments  Date Time Provider [place]  07/02/2022  9:40 AM Freada Bergeron, MD [facility]  07/13/2022 10:30 AM Donnal Moat T, PA-C [facility] None  09/21/2022 11:30 AM Shamleffer, Melanie Crazier, MD [facility] None     Take care and be well!  [facility]  [place], [place] [address] [phone]

## 2022-07-01 NOTE — Discharge Summary (Shared)
Matthew [name] Discharge Summary  Patient name: Matthew [name] Medical record number: [id number] Date of birth: 30-May-1975 Age: 47 y.o. Gender: male Date of Admission: 06/30/2022  Date of Discharge: 07/02/2022  Admitting Physician: Leslie Dales, DO  Primary Care Provider: [name], MD Consultants: Psychiatry   Indication for Hospitalization: Altered Mental Status   Brief [name] Course:  Matthew [name] is a 47 y.o. male who was admitted to [facility] for altered mental status suspected due to benzodiazepine overdose.  [name] course is listed below by problem, refer to the H&P for additional information.  AMS 2/2 Benzodiazepine Overdose Patient presented via EMS due to AMS noticed by neighbors. Per EMS there were pill bottles "all over" the household. Vitals stable, afebrile. Pupils were not pinpoint. ED workup included negative Acetaminophen and Salicylate level, VBG without hypercarbia, UDS positive for benzodiazepines. CT head unremarkable. Patient was initially somnolent but able to wake up briefly to answer questions and follow command but difficult to understand. Was protecting airway. CIWA ordered to monitor for benzo withdrawal, remained stable. Psychiatry was consulted due to previous hx of benzo overdose, unclear cause and concern for possible SI attempt as cause. Psychiatry given clonazepam to prevent withdrawal. Psych recommended inpatient admission given hx of repeated attempt at Matthew [name] and inability to contract for safety.   Schizoaffective disorder, bipolar type without good prognostic features Present on admission. Taking perphenazine, lithium, duloxetine, and deutetrabenazine. During hospitalization perphenazine, lithium, duloxetine continued. Deutetrabenazine held.  Type 2 Diabetes Mellitus A1c 8.3 this admission. Home medications initially held given AMS. Was restarted on moderate SSI during hospitalization. Pregabalin held  given altered status.    Other conditions chronic and stable.  Insomnia - trazodone (held while hospitalized) OSA - CPAP Hypothyroid - on synthroid  Discharge Diagnoses/Problem List:  Principal Problem:   Schizoaffective disorder, [name] Active Problems:   Intentional overdose of alprazolam (Matthew [name])   Type 2 diabetes mellitus with diabetic polyneuropathy, with long-term current use of insulin (HCC)   Hypothyroid   Insomnia   OSA (obstructive sleep apnea)   Altered mental status   Generalized anxiety disorder   Disposition: Home   Discharge Condition: Stable   Discharge Exam:  Blood pressure (!) 141/97, pulse 83, temperature 97.7 F (36.5 C), temperature source Oral, resp. rate 18, height 6' 3"$  (1.905 m), weight 108.9 kg, SpO2 93 %. Physical Exam: General: NAD, sitting up in bed, small laceration noted between eyebrows Cardiovascular: RRR without murmur Respiratory: CTAB, initially on 2L , weaned to room air, SpO2 94-95% Abdomen: Distended, non-tender to palpation in all quadrants Extremities: No edema Psych: Flat affect  Issues for Follow Up:  Monitor mood Recheck Lithium level   Significant Procedures: None  Significant Labs and Imaging:  Recent Labs  Lab 07/01/22 0732  WBC 8.5  HGB 15.2  HCT 44.4  PLT 247   Recent Labs  Lab 07/01/22 0732 07/02/22 0407  NA 132* 135  K 4.2 3.6  CL 101 103  CO2 21* 21*  GLUCOSE 255* 172*  BUN 12 14  CREATININE 1.17 1.10  CALCIUM 9.0 9.2    CT Head Wo Contrast Result Date: 06/30/2022 IMPRESSION: No acute intracranial abnormalities. No change from the recent prior study. Electronically Signed   By: Lajean Manes M.D.   On: 06/30/2022 13:08   DG Chest Portable 1 View Result Date: 06/30/2022 IMPRESSION: 1. The lateral left lung base is poorly evaluated due to portable technique.  Recommend PA and lateral chest x-ray. 2. Stable cardiomegaly. 3. No other abnormalities.  Electronically Signed   By: Dorise Bullion III M.D.   On: 06/30/2022 11:46    Results/Tests Pending at Time of Discharge: None  Discharge Medications:  Allergies as of 07/02/2022       Reactions   Gluten Meal Other (See Comments)   Celiac disease   Lactose Intolerance (gi) Diarrhea   Can tolerate hard cheese        Medication List     STOP taking these medications    ALPRAZolam 1 MG tablet Commonly known as: XANAX   atomoxetine 80 MG capsule Commonly known as: STRATTERA   Austedo XR 24 MG Tb24 Generic drug: Deutetrabenazine ER   CO Q-10 PO   empagliflozin 25 MG Tabs tablet Commonly known as: Jardiance   Insulin Pen Needle 32G X 4 MM Misc   metFORMIN 1000 MG tablet Commonly known as: GLUCOPHAGE   nitroGLYCERIN 0.4 MG SL tablet Commonly known as: Nitrostat   Osteo Bi-Flex One Per Day Tabs   pregabalin 100 MG capsule Commonly known as: LYRICA   spironolactone 25 MG tablet Commonly known as: ALDACTONE   SUDAFED 12 HOUR PO   traZODone 100 MG tablet Commonly known as: DESYREL   zaleplon 10 MG capsule Commonly known as: SONATA       TAKE these medications    aspirin EC 81 MG tablet Take 1 tablet (81 mg total) by mouth daily. Swallow whole.   atorvastatin 80 MG tablet Commonly known as: LIPITOR Take 1 tablet (80 mg total) by mouth every morning.   carvedilol 25 MG tablet Commonly known as: COREG Take 1.5 tablets (37.5 mg total) by mouth 2 (two) times daily.   clopidogrel 75 MG tablet Commonly known as: PLAVIX Take 1 tablet (75 mg total) by mouth daily.   Dexcom G7 Sensor Misc 1 Device by Does not apply route as directed.   DULoxetine 30 MG capsule Commonly known as: CYMBALTA Take 3 capsules (90 mg total) by mouth daily. Start taking on: July 03, 2022 What changed:  how much to take additional instructions Another medication with the same name was removed. Continue taking this medication, and follow the directions you see here.    fenofibrate 145 MG tablet Commonly known as: Tricor Take 1 tablet (145 mg total) by mouth daily.   gabapentin 400 MG capsule Commonly known as: Neurontin 2 capsules in the morning, 1 around lunch, 1 late afternoon, 1 at night for total of 5 daily.   icosapent Ethyl 1 g capsule Commonly known as: VASCEPA Take 2 capsules (2 g total) by mouth 2 (two) times daily.   Lantus SoloStar 100 UNIT/ML Solostar Pen Generic drug: insulin glargine Inject 25 Units into the skin daily.   levothyroxine 25 MCG tablet Commonly known as: SYNTHROID TAKE 1 TABLET IN THE MORNING WITH A GLASS OF WATER, WAIT 1 HOUR BEFORE TAKING OTHER MEDS OR EATING   lithium carbonate 300 MG ER tablet Commonly known as: LITHOBID Take 3 tablets (900 mg total) by mouth at bedtime. What changed: when to take this   nicotine 14 mg/24hr patch Commonly known as: NICODERM CQ - dosed in mg/24 hours Place 1 patch (14 mg total) onto the skin daily.   perphenazine 16 MG tablet Commonly known as: TRILAFON TAKE 1 TABLET BY MOUTH EVERY MORNING AND 2 TABLETS AT BEDTIME What changed: See the new instructions.       Discharge Instructions: Please refer to Patient Instructions  section of EMR for full details.  Patient was counseled important signs and symptoms that should prompt return to medical care, changes in medications, dietary instructions, activity restrictions, and follow up appointments.   Precious Gilding, DO 07/02/2022, 2:17 PM PGY-3, [place]

## 2022-07-02 ENCOUNTER — Encounter (HOSPITAL_COMMUNITY): Payer: Self-pay | Admitting: Family

## 2022-07-02 ENCOUNTER — Other Ambulatory Visit: Payer: Self-pay

## 2022-07-02 ENCOUNTER — Inpatient Hospital Stay (HOSPITAL_COMMUNITY)
Admission: AD | Admit: 2022-07-02 | Discharge: 2022-07-07 | DRG: 885 | Disposition: A | Discharge status: Home / Self Care | Payer: Medicare HMO | Source: Intra-hospital | Attending: Psychiatry | Admitting: Psychiatry

## 2022-07-02 ENCOUNTER — Ambulatory Visit: Payer: Medicare HMO | Admitting: Cardiology

## 2022-07-02 DIAGNOSIS — Z823 Family history of stroke: Secondary | ICD-10-CM

## 2022-07-02 DIAGNOSIS — G4700 Insomnia, unspecified: Secondary | ICD-10-CM | POA: Diagnosis present

## 2022-07-02 DIAGNOSIS — T424X1A Poisoning by benzodiazepines, accidental (unintentional), initial encounter: Secondary | ICD-10-CM | POA: Diagnosis not present

## 2022-07-02 DIAGNOSIS — Z79890 Hormone replacement therapy (postmenopausal): Secondary | ICD-10-CM | POA: Diagnosis not present

## 2022-07-02 DIAGNOSIS — F332 Major depressive disorder, recurrent severe without psychotic features: Secondary | ICD-10-CM | POA: Diagnosis not present

## 2022-07-02 DIAGNOSIS — F411 Generalized anxiety disorder: Secondary | ICD-10-CM | POA: Diagnosis present

## 2022-07-02 DIAGNOSIS — Z9151 Personal history of suicidal behavior: Secondary | ICD-10-CM | POA: Diagnosis not present

## 2022-07-02 DIAGNOSIS — R404 Transient alteration of awareness: Secondary | ICD-10-CM | POA: Diagnosis not present

## 2022-07-02 DIAGNOSIS — F250 Schizoaffective disorder, bipolar type: Secondary | ICD-10-CM | POA: Diagnosis not present

## 2022-07-02 DIAGNOSIS — Z7982 Long term (current) use of aspirin: Secondary | ICD-10-CM

## 2022-07-02 DIAGNOSIS — E7800 Pure hypercholesterolemia, unspecified: Secondary | ICD-10-CM | POA: Diagnosis present

## 2022-07-02 DIAGNOSIS — T424X2A Poisoning by benzodiazepines, intentional self-harm, initial encounter: Secondary | ICD-10-CM | POA: Diagnosis present

## 2022-07-02 DIAGNOSIS — I252 Old myocardial infarction: Secondary | ICD-10-CM | POA: Diagnosis not present

## 2022-07-02 DIAGNOSIS — Z955 Presence of coronary angioplasty implant and graft: Secondary | ICD-10-CM | POA: Diagnosis not present

## 2022-07-02 DIAGNOSIS — I2510 Atherosclerotic heart disease of native coronary artery without angina pectoris: Secondary | ICD-10-CM | POA: Diagnosis not present

## 2022-07-02 DIAGNOSIS — Z79899 Other long term (current) drug therapy: Secondary | ICD-10-CM | POA: Diagnosis not present

## 2022-07-02 DIAGNOSIS — E039 Hypothyroidism, unspecified: Secondary | ICD-10-CM | POA: Diagnosis present

## 2022-07-02 DIAGNOSIS — E669 Obesity, unspecified: Secondary | ICD-10-CM | POA: Diagnosis not present

## 2022-07-02 DIAGNOSIS — F17210 Nicotine dependence, cigarettes, uncomplicated: Secondary | ICD-10-CM | POA: Diagnosis present

## 2022-07-02 DIAGNOSIS — I10 Essential (primary) hypertension: Secondary | ICD-10-CM | POA: Diagnosis not present

## 2022-07-02 DIAGNOSIS — Z794 Long term (current) use of insulin: Secondary | ICD-10-CM

## 2022-07-02 DIAGNOSIS — Z7902 Long term (current) use of antithrombotics/antiplatelets: Secondary | ICD-10-CM

## 2022-07-02 DIAGNOSIS — Z1152 Encounter for screening for COVID-19: Secondary | ICD-10-CM | POA: Diagnosis not present

## 2022-07-02 DIAGNOSIS — G629 Polyneuropathy, unspecified: Secondary | ICD-10-CM | POA: Diagnosis present

## 2022-07-02 DIAGNOSIS — Z7952 Long term (current) use of systemic steroids: Secondary | ICD-10-CM | POA: Diagnosis not present

## 2022-07-02 DIAGNOSIS — K30 Functional dyspepsia: Secondary | ICD-10-CM | POA: Diagnosis present

## 2022-07-02 DIAGNOSIS — E119 Type 2 diabetes mellitus without complications: Secondary | ICD-10-CM | POA: Diagnosis not present

## 2022-07-02 DIAGNOSIS — E1165 Type 2 diabetes mellitus with hyperglycemia: Secondary | ICD-10-CM | POA: Diagnosis not present

## 2022-07-02 DIAGNOSIS — G4733 Obstructive sleep apnea (adult) (pediatric): Secondary | ICD-10-CM | POA: Diagnosis present

## 2022-07-02 DIAGNOSIS — Z96611 Presence of right artificial shoulder joint: Secondary | ICD-10-CM | POA: Diagnosis present

## 2022-07-02 LAB — BASIC METABOLIC PANEL
Anion gap: 11 (ref 5–15)
BUN: 14 mg/dL (ref 6–20)
CO2: 21 mmol/L — ABNORMAL LOW (ref 22–32)
Calcium: 9.2 mg/dL (ref 8.9–10.3)
Chloride: 103 mmol/L (ref 98–111)
Creatinine, Ser: 1.1 mg/dL (ref 0.61–1.24)
GFR, Estimated: >60 mL/min (ref >60)
Glucose, Bld: 172 mg/dL — ABNORMAL HIGH (ref 70–99)
Potassium: 3.6 mmol/L (ref 3.5–5.1)
Sodium: 135 mmol/L (ref 135–145)

## 2022-07-02 LAB — GLUCOSE, CAPILLARY
Glucose-Capillary: 194 mg/dL — ABNORMAL HIGH (ref 70–99)
Glucose-Capillary: 238 mg/dL — ABNORMAL HIGH (ref 70–99)
Glucose-Capillary: 225 mg/dL — ABNORMAL HIGH (ref 70–99)
Glucose-Capillary: 266 mg/dL — ABNORMAL HIGH (ref 70–99)
Glucose-Capillary: 247 mg/dL — ABNORMAL HIGH (ref 70–99)

## 2022-07-02 MED ORDER — HYDROXYZINE HCL 25 MG PO TABS
25.0000 mg | ORAL_TABLET | Freq: Once | ORAL | Status: AC
  Administered 2022-07-02: 25 mg via ORAL
  Filled 2022-07-02: qty 1

## 2022-07-02 MED ORDER — ALUM & MAG HYDROXIDE-SIMETH 200-200-20 MG/5ML PO SUSP: 30.0000 mL | ORAL | Status: DC | PRN

## 2022-07-02 MED ORDER — DULOXETINE HCL 60 MG PO CPEP
90.0000 mg | ORAL_CAPSULE | Freq: Every day | ORAL | Status: DC
  Administered 2022-07-03 – 2022-07-07 (×5): 90 mg via ORAL
  Filled 2022-07-02 (×6): qty 1

## 2022-07-02 MED ORDER — INSULIN ASPART 100 UNIT/ML IJ SOLN
0.0000 [IU] | Freq: Three times a day (TID) | INTRAMUSCULAR | Status: DC
  Administered 2022-07-02: 8 [IU] via SUBCUTANEOUS
  Administered 2022-07-03 (×2): 5 [IU] via SUBCUTANEOUS
  Administered 2022-07-03: 8 [IU] via SUBCUTANEOUS
  Administered 2022-07-04 (×3): 5 [IU] via SUBCUTANEOUS
  Administered 2022-07-05: 8 [IU] via SUBCUTANEOUS
  Administered 2022-07-05: 15 [IU] via SUBCUTANEOUS
  Administered 2022-07-05: 5 [IU] via SUBCUTANEOUS
  Administered 2022-07-06: 3 [IU] via SUBCUTANEOUS
  Administered 2022-07-06: 5 [IU] via SUBCUTANEOUS
  Administered 2022-07-06: 15 [IU] via SUBCUTANEOUS
  Administered 2022-07-07: 5 [IU] via SUBCUTANEOUS
  Administered 2022-07-07: 3 [IU] via SUBCUTANEOUS

## 2022-07-02 MED ORDER — FENOFIBRATE 160 MG PO TABS
160.0000 mg | ORAL_TABLET | Freq: Every day | ORAL | Status: DC
  Administered 2022-07-03 – 2022-07-07 (×5): 160 mg via ORAL
  Filled 2022-07-02 (×6): qty 1

## 2022-07-02 MED ORDER — INSULIN GLARGINE-YFGN 100 UNIT/ML ~~LOC~~ SOLN
10.0000 [IU] | Freq: Every morning | SUBCUTANEOUS | Status: DC
  Administered 2022-07-03: 10 [IU] via SUBCUTANEOUS

## 2022-07-02 MED ORDER — NICOTINE 21 MG/24HR TD PT24: 21.0000 mg | MEDICATED_PATCH | Freq: Every day | TRANSDERMAL | Status: DC

## 2022-07-02 MED ORDER — NICOTINE POLACRILEX 2 MG MT GUM
2.0000 mg | CHEWING_GUM | OROMUCOSAL | Status: DC | PRN
  Administered 2022-07-02 – 2022-07-07 (×14): 2 mg via ORAL
  Filled 2022-07-02 (×9): qty 1

## 2022-07-02 MED ORDER — MAGNESIUM HYDROXIDE 400 MG/5ML PO SUSP: 30.0000 mL | Freq: Every day | ORAL | Status: DC | PRN

## 2022-07-02 MED ORDER — PERPHENAZINE 16 MG PO TABS
16.0000 mg | ORAL_TABLET | Freq: Every day | ORAL | Status: DC
  Administered 2022-07-03: 16 mg via ORAL
  Filled 2022-07-02 (×3): qty 1

## 2022-07-02 MED ORDER — ICOSAPENT ETHYL 1 G PO CAPS
2.0000 g | ORAL_CAPSULE | Freq: Two times a day (BID) | ORAL | Status: DC
  Administered 2022-07-03 – 2022-07-07 (×8): 2 g via ORAL
  Filled 2022-07-02 (×12): qty 2

## 2022-07-02 MED ORDER — NICOTINE POLACRILEX 2 MG MT GUM
2.0000 mg | CHEWING_GUM | OROMUCOSAL | Status: DC | PRN
  Administered 2022-07-02: 2 mg via ORAL
  Filled 2022-07-02 (×2): qty 1

## 2022-07-02 MED ORDER — ATORVASTATIN CALCIUM 80 MG PO TABS
80.0000 mg | ORAL_TABLET | Freq: Every morning | ORAL | Status: DC
  Administered 2022-07-03 – 2022-07-07 (×5): 80 mg via ORAL
  Filled 2022-07-02 (×7): qty 1

## 2022-07-02 MED ORDER — ACETAMINOPHEN 325 MG PO TABS
650.0000 mg | ORAL_TABLET | Freq: Four times a day (QID) | ORAL | Status: DC | PRN
  Administered 2022-07-03 – 2022-07-07 (×7): 650 mg via ORAL
  Filled 2022-07-02 (×7): qty 2

## 2022-07-02 MED ORDER — LEVOTHYROXINE SODIUM 25 MCG PO TABS
25.0000 ug | ORAL_TABLET | Freq: Every morning | ORAL | Status: DC
  Administered 2022-07-03 – 2022-07-07 (×5): 25 ug via ORAL
  Filled 2022-07-02 (×7): qty 1

## 2022-07-02 MED ORDER — DULOXETINE HCL 30 MG PO CPEP: 90.0000 mg | ORAL_CAPSULE | Freq: Every day | ORAL | 0 refills | Status: DC

## 2022-07-02 MED ORDER — PERPHENAZINE 16 MG PO TABS
32.0000 mg | ORAL_TABLET | Freq: Every day | ORAL | Status: DC
  Administered 2022-07-02: 32 mg via ORAL
  Filled 2022-07-02 (×3): qty 2

## 2022-07-02 MED ORDER — LITHIUM CARBONATE ER 450 MG PO TBCR
900.0000 mg | EXTENDED_RELEASE_TABLET | Freq: Every day | ORAL | Status: DC
  Administered 2022-07-02: 900 mg via ORAL
  Filled 2022-07-02 (×4): qty 2

## 2022-07-02 MED ORDER — CLONAZEPAM 0.5 MG PO TABS
0.5000 mg | ORAL_TABLET | Freq: Two times a day (BID) | ORAL | Status: DC | PRN
  Administered 2022-07-02 – 2022-07-05 (×6): 0.5 mg via ORAL
  Filled 2022-07-02 (×7): qty 1

## 2022-07-02 MED ORDER — ASPIRIN 81 MG PO TBEC
81.0000 mg | DELAYED_RELEASE_TABLET | Freq: Every day | ORAL | Status: DC
  Administered 2022-07-03 – 2022-07-07 (×5): 81 mg via ORAL
  Filled 2022-07-02 (×6): qty 1

## 2022-07-02 MED ORDER — CARVEDILOL 25 MG PO TABS
37.5000 mg | ORAL_TABLET | Freq: Two times a day (BID) | ORAL | Status: DC
  Administered 2022-07-02 – 2022-07-07 (×10): 37.5 mg via ORAL
  Filled 2022-07-02 (×14): qty 1

## 2022-07-02 MED ORDER — CLOPIDOGREL BISULFATE 75 MG PO TABS
75.0000 mg | ORAL_TABLET | Freq: Every day | ORAL | Status: DC
  Administered 2022-07-03 – 2022-07-07 (×5): 75 mg via ORAL
  Filled 2022-07-02 (×7): qty 1

## 2022-07-02 NOTE — [facility] Group Notes (Signed)
Patient attended the [place] group but left shortly after it began.

## 2022-07-02 NOTE — Plan of Care (Deleted)
Problem: Education: Goal: Knowledge of General Education information will improve Description: Including pain rating scale, medication(s)/side effects and non-pharmacologic comfort measures 07/02/2022 1348 by Vonna Kotyk, RN Outcome: Adequate for Discharge 07/02/2022 1348 by Vonna Kotyk, RN Outcome: Adequate for Discharge   Problem: Health Behavior/Discharge Planning: Goal: Ability to manage health-related needs will improve 07/02/2022 1348 by Vonna Kotyk, RN Outcome: Adequate for Discharge 07/02/2022 1348 by Vonna Kotyk, RN Outcome: Adequate for Discharge   Problem: Clinical Measurements: Goal: Ability to maintain clinical measurements within normal limits will improve 07/02/2022 1348 by Vonna Kotyk, RN Outcome: Adequate for Discharge 07/02/2022 1348 by Vonna Kotyk, RN Outcome: Adequate for Discharge Goal: Will remain free from infection 07/02/2022 1348 by Vonna Kotyk, RN Outcome: Adequate for Discharge 07/02/2022 1348 by Vonna Kotyk, RN Outcome: Adequate for Discharge Goal: Diagnostic test results will improve 07/02/2022 1348 by Vonna Kotyk, RN Outcome: Adequate for Discharge 07/02/2022 1348 by Vonna Kotyk, RN Outcome: Adequate for Discharge Goal: Respiratory complications will improve 07/02/2022 1348 by Vonna Kotyk, RN Outcome: Adequate for Discharge 07/02/2022 1348 by Vonna Kotyk, RN Outcome: Adequate for Discharge Goal: Cardiovascular complication will be avoided 07/02/2022 1348 by Vonna Kotyk, RN Outcome: Adequate for Discharge 07/02/2022 1348 by Vonna Kotyk, RN Outcome: Adequate for Discharge   Problem: Activity: Goal: Risk for activity intolerance will decrease 07/02/2022 1348 by Vonna Kotyk, RN Outcome: Adequate for Discharge 07/02/2022 1348 by Vonna Kotyk, RN Outcome: Adequate for Discharge   Problem: Nutrition: Goal: Adequate nutrition will be maintained 07/02/2022 1348 by Vonna Kotyk, RN Outcome: Adequate for Discharge 07/02/2022 1348  by Vonna Kotyk, RN Outcome: Adequate for Discharge   Problem: Coping: Goal: Level of anxiety will decrease 07/02/2022 1348 by Vonna Kotyk, RN Outcome: Adequate for Discharge 07/02/2022 1348 by Vonna Kotyk, RN Outcome: Adequate for Discharge   Problem: Elimination: Goal: Will not experience complications related to bowel motility 07/02/2022 1348 by Vonna Kotyk, RN Outcome: Adequate for Discharge 07/02/2022 1348 by Vonna Kotyk, RN Outcome: Adequate for Discharge Goal: Will not experience complications related to urinary retention 07/02/2022 1348 by Vonna Kotyk, RN Outcome: Adequate for Discharge 07/02/2022 1348 by Vonna Kotyk, RN Outcome: Adequate for Discharge   Problem: Pain Managment: Goal: General experience of comfort will improve 07/02/2022 1348 by Vonna Kotyk, RN Outcome: Adequate for Discharge 07/02/2022 1348 by Vonna Kotyk, RN Outcome: Adequate for Discharge   Problem: Safety: Goal: Ability to remain free from injury will improve 07/02/2022 1348 by Vonna Kotyk, RN Outcome: Adequate for Discharge 07/02/2022 1348 by Vonna Kotyk, RN Outcome: Adequate for Discharge   Problem: Skin Integrity: Goal: Risk for impaired skin integrity will decrease 07/02/2022 1348 by Vonna Kotyk, RN Outcome: Adequate for Discharge 07/02/2022 1348 by Vonna Kotyk, RN Outcome: Adequate for Discharge   Problem: Education: Goal: Ability to describe self-care measures that may prevent or decrease complications (Diabetes Survival Skills Education) will improve 07/02/2022 1348 by Vonna Kotyk, RN Outcome: Adequate for Discharge 07/02/2022 1348 by Vonna Kotyk, RN Outcome: Adequate for Discharge Goal: Individualized Educational Video(s) 07/02/2022 1348 by Vonna Kotyk, RN Outcome: Adequate for Discharge 07/02/2022 1348 by Vonna Kotyk, RN Outcome: Adequate for Discharge   Problem: Coping: Goal: Ability to adjust to condition or change in health will improve 07/02/2022 1348  by Vonna Kotyk, RN Outcome: Adequate for Discharge 07/02/2022 1348 by Vonna Kotyk,  RN Outcome: Adequate for Discharge   Problem: Fluid Volume: Goal: Ability to maintain a balanced intake and output will improve 07/02/2022 1348 by Vonna Kotyk, RN Outcome: Adequate for Discharge 07/02/2022 1348 by Vonna Kotyk, RN Outcome: Adequate for Discharge   Problem: Health Behavior/Discharge Planning: Goal: Ability to identify and utilize available resources and services will improve 07/02/2022 1348 by Vonna Kotyk, RN Outcome: Adequate for Discharge 07/02/2022 1348 by Vonna Kotyk, RN Outcome: Adequate for Discharge Goal: Ability to manage health-related needs will improve 07/02/2022 1348 by Vonna Kotyk, RN Outcome: Adequate for Discharge 07/02/2022 1348 by Vonna Kotyk, RN Outcome: Adequate for Discharge   Problem: Metabolic: Goal: Ability to maintain appropriate glucose levels will improve 07/02/2022 1348 by Vonna Kotyk, RN Outcome: Adequate for Discharge 07/02/2022 1348 by Vonna Kotyk, RN Outcome: Adequate for Discharge   Problem: Nutritional: Goal: Maintenance of adequate nutrition will improve 07/02/2022 1348 by Vonna Kotyk, RN Outcome: Adequate for Discharge 07/02/2022 1348 by Vonna Kotyk, RN Outcome: Adequate for Discharge Goal: Progress toward achieving an optimal weight will improve 07/02/2022 1348 by Vonna Kotyk, RN Outcome: Adequate for Discharge 07/02/2022 1348 by Vonna Kotyk, RN Outcome: Adequate for Discharge   Problem: Skin Integrity: Goal: Risk for impaired skin integrity will decrease 07/02/2022 1348 by Vonna Kotyk, RN Outcome: Adequate for Discharge 07/02/2022 1348 by Vonna Kotyk, RN Outcome: Adequate for Discharge   Problem: Tissue Perfusion: Goal: Adequacy of tissue perfusion will improve 07/02/2022 1348 by Vonna Kotyk, RN Outcome: Adequate for Discharge 07/02/2022 1348 by Vonna Kotyk, RN Outcome: Adequate for Discharge   Problem:  Education: Goal: Ability to describe self-care measures that may prevent or decrease complications (Diabetes Survival Skills Education) will improve 07/02/2022 1348 by Vonna Kotyk, RN Outcome: Adequate for Discharge 07/02/2022 1348 by Vonna Kotyk, RN Outcome: Adequate for Discharge Goal: Individualized Educational Video(s) 07/02/2022 1348 by Vonna Kotyk, RN Outcome: Adequate for Discharge 07/02/2022 1348 by Vonna Kotyk, RN Outcome: Adequate for Discharge   Problem: Coping: Goal: Ability to adjust to condition or change in health will improve 07/02/2022 1348 by Vonna Kotyk, RN Outcome: Adequate for Discharge 07/02/2022 1348 by Vonna Kotyk, RN Outcome: Adequate for Discharge   Problem: Fluid Volume: Goal: Ability to maintain a balanced intake and output will improve 07/02/2022 1348 by Vonna Kotyk, RN Outcome: Adequate for Discharge 07/02/2022 1348 by Vonna Kotyk, RN Outcome: Adequate for Discharge   Problem: Health Behavior/Discharge Planning: Goal: Ability to identify and utilize available resources and services will improve 07/02/2022 1348 by Vonna Kotyk, RN Outcome: Adequate for Discharge 07/02/2022 1348 by Vonna Kotyk, RN Outcome: Adequate for Discharge Goal: Ability to manage health-related needs will improve 07/02/2022 1348 by Vonna Kotyk, RN Outcome: Adequate for Discharge 07/02/2022 1348 by Vonna Kotyk, RN Outcome: Adequate for Discharge   Problem: Metabolic: Goal: Ability to maintain appropriate glucose levels will improve 07/02/2022 1348 by Vonna Kotyk, RN Outcome: Adequate for Discharge 07/02/2022 1348 by Vonna Kotyk, RN Outcome: Adequate for Discharge   Problem: Nutritional: Goal: Maintenance of adequate nutrition will improve 07/02/2022 1348 by Vonna Kotyk, RN Outcome: Adequate for Discharge 07/02/2022 1348 by Vonna Kotyk, RN Outcome: Adequate for Discharge Goal: Progress toward achieving an optimal weight will improve 07/02/2022 1348 by  Vonna Kotyk, RN Outcome: Adequate for Discharge 07/02/2022 1348 by Vonna Kotyk, RN Outcome: Adequate for Discharge  Problem: Skin Integrity: Goal: Risk for impaired skin integrity will decrease 07/02/2022 1348 by Vonna Kotyk, RN Outcome: Adequate for Discharge 07/02/2022 1348 by Vonna Kotyk, RN Outcome: Adequate for Discharge   Problem: Tissue Perfusion: Goal: Adequacy of tissue perfusion will improve 07/02/2022 1348 by Vonna Kotyk, RN Outcome: Adequate for Discharge 07/02/2022 1348 by Vonna Kotyk, RN Outcome: Adequate for Discharge

## 2022-07-02 NOTE — Care Management Obs Status (Signed)
[place] NOTIFICATION   Patient Details  Name: Matthew [name] MRN: [id number] Date of Birth: 1975-10-26   Medicare Observation Status Notification Given:  Yes    Tom-Johnson, Renea Ee, RN 07/02/2022, 9:38 AM

## 2022-07-02 NOTE — Progress Notes (Signed)
Report called to [facility] 

## 2022-07-02 NOTE — Progress Notes (Signed)
[facility]MTS Brief Progress Note  Patient endorsing worsening anxiety. Ordered 1 dose hydroxyzine   Camelia Phenes, MD [place] [facility] Intern (PGY-1) 07/02/2022 2:02 PM

## 2022-07-02 NOTE — Plan of Care (Signed)
Pt newly admitted

## 2022-07-02 NOTE — Plan of Care (Signed)
Problem: Education: Goal: Knowledge of General Education information will improve Description: Including pain rating scale, medication(s)/side effects and non-pharmacologic comfort measures Outcome: Adequate for Discharge   Problem: Health Behavior/Discharge Planning: Goal: Ability to manage health-related needs will improve Outcome: Adequate for Discharge   Problem: Clinical Measurements: Goal: Ability to maintain clinical measurements within normal limits will improve Outcome: Adequate for Discharge Goal: Will remain free from infection Outcome: Adequate for Discharge Goal: Diagnostic test results will improve Outcome: Adequate for Discharge Goal: Respiratory complications will improve Outcome: Adequate for Discharge Goal: Cardiovascular complication will be avoided Outcome: Adequate for Discharge   Problem: Activity: Goal: Risk for activity intolerance will decrease Outcome: Adequate for Discharge   Problem: Nutrition: Goal: Adequate nutrition will be maintained Outcome: Adequate for Discharge   Problem: Coping: Goal: Level of anxiety will decrease Outcome: Adequate for Discharge   Problem: Elimination: Goal: Will not experience complications related to bowel motility Outcome: Adequate for Discharge Goal: Will not experience complications related to urinary retention Outcome: Adequate for Discharge   Problem: Pain Managment: Goal: General experience of comfort will improve Outcome: Adequate for Discharge   Problem: Safety: Goal: Ability to remain free from injury will improve Outcome: Adequate for Discharge   Problem: Skin Integrity: Goal: Risk for impaired skin integrity will decrease Outcome: Adequate for Discharge   Problem: Education: Goal: Ability to describe self-care measures that may prevent or decrease complications (Diabetes Survival Skills Education) will improve Outcome: Adequate for Discharge Goal: Individualized Educational Video(s) Outcome:  Adequate for Discharge   Problem: Coping: Goal: Ability to adjust to condition or change in health will improve Outcome: Adequate for Discharge   Problem: Fluid Volume: Goal: Ability to maintain a balanced intake and output will improve Outcome: Adequate for Discharge   Problem: Health Behavior/Discharge Planning: Goal: Ability to identify and utilize available resources and services will improve Outcome: Adequate for Discharge Goal: Ability to manage health-related needs will improve Outcome: Adequate for Discharge   Problem: Metabolic: Goal: Ability to maintain appropriate glucose levels will improve Outcome: Adequate for Discharge   Problem: Nutritional: Goal: Maintenance of adequate nutrition will improve Outcome: Adequate for Discharge Goal: Progress toward achieving an optimal weight will improve Outcome: Adequate for Discharge   Problem: Skin Integrity: Goal: Risk for impaired skin integrity will decrease Outcome: Adequate for Discharge   Problem: Tissue Perfusion: Goal: Adequacy of tissue perfusion will improve Outcome: Adequate for Discharge   Problem: Education: Goal: Ability to describe self-care measures that may prevent or decrease complications (Diabetes Survival Skills Education) will improve Outcome: Adequate for Discharge Goal: Individualized Educational Video(s) Outcome: Adequate for Discharge   Problem: Coping: Goal: Ability to adjust to condition or change in health will improve Outcome: Adequate for Discharge   Problem: Fluid Volume: Goal: Ability to maintain a balanced intake and output will improve Outcome: Adequate for Discharge   Problem: Health Behavior/Discharge Planning: Goal: Ability to identify and utilize available resources and services will improve Outcome: Adequate for Discharge Goal: Ability to manage health-related needs will improve Outcome: Adequate for Discharge   Problem: Metabolic: Goal: Ability to maintain appropriate  glucose levels will improve Outcome: Adequate for Discharge   Problem: Nutritional: Goal: Maintenance of adequate nutrition will improve Outcome: Adequate for Discharge Goal: Progress toward achieving an optimal weight will improve Outcome: Adequate for Discharge   Problem: Skin Integrity: Goal: Risk for impaired skin integrity will decrease Outcome: Adequate for Discharge   Problem: Tissue Perfusion: Goal: Adequacy of tissue perfusion will improve Outcome: Adequate for Discharge

## 2022-07-02 NOTE — Progress Notes (Addendum)
   Daily Progress Note Intern Pager: [phone]  Patient name: Matthew [name] Medical record number: [id number] Date of birth: May 27, 1975 Age: 47 y.o. Gender: male  Primary Care Provider: [name], MD Consultants: psych Code Status: FULL CODE  Pt Overview and Major Events to Date:  2/10: Admitted    Assessment and Plan: Matthew [name] is a 47 y.o. male who was admitted for AMS thought to be due to benzodiazepine overdose. PMHx is significant for schizoaffective disorder, tobacco use, previous benzodiazepine overdose.   Patient medically stable for transfer to inpatient psych  * Schizoaffective disorder, bipolar type without good prognostic features (Matthew [name]) Home meds: perphenazine 16 mg AM and 32 mg PM and lithium carbonate 900 mg daily, deutetrabenazine 24 mg daily for TD.  He reports taking lithium as prescribed but level returned subtherapeutic. - Consult psych, appreciate recommendations on medication management  - continue home perphanzine, lithium, and duloxetine - hold deutetrabenazine  Type 2 diabetes mellitus with diabetic polyneuropathy, with long-term current use of insulin (HCC) A1c was 8.3. Home medications of metformin 1000 mg BID, insulin glargine 25 units daily, Jardiance 25 mg daily, pregabalin 100 mg BID (diabetic neuropathy) were held on admission. Given improved mentation, diet was restarted last night. CBG elevated. - Hold metformin and Pregabalin - Start Semglee 10U with mSSI without HS coverage - CBGs AC and qHS   Hypothyroid TSH returned at 0.498.  - Continue synthroid 25 mcg   Insomnia Taking trazodone. Hold for now.   OSA (obstructive sleep apnea) Offer CPAP nightly.  Altered mental status Mentation now back at baseline. TSH returned normal, lithium level returned subtherapeutic. Patient denies benzo overdose or taking any other medications inappropriately. He denies SI.  - psych consulted, appreciate recs - CIWAs to monitor for benzo withdrawal -  On 0.5 mg clonazepam BID PRN to prevent benzo withdrawal  FEN/GI: Regular  PPx: Lovenox  Dispo:Home likely today. Barriers include Psych evaluation.   Subjective:  Patient denies having had any SI. He denies AVH - says has not had them in a long time. He asks how long he is to stay at the inpatient psychiatric hospital.  Objective: Temp:  [97.7 F (36.5 C)-98.8 F (37.1 C)] 97.7 F (36.5 C) (02/12 0934) Pulse Rate:  [78-95] 83 (02/12 0706) Resp:  [14-23] 18 (02/12 1115) BP: (123-155)/(66-108) 155/97 (02/12 0706) SpO2:  [92 %-97 %] 93 % (02/12 0706) Physical Exam: General: in no acute distress HEENT: normocephalic and atraumatic Respiratory: non-labored breathing and on RA Extremities: moving all extremities spontaneously Gastrointestinal: non-distended Cardiovascular: regular rate  Laboratory: Most recent CBC Lab Results  Component Value Date   WBC 8.5 07/01/2022   HGB 15.2 07/01/2022   HCT 44.4 07/01/2022   MCV 84.6 07/01/2022   PLT 247 07/01/2022   Most recent BMP    Latest Ref Rng & Units 07/02/2022    4:07 AM  BMP  Glucose 70 - 99 mg/dL 172   BUN 6 - 20 mg/dL 14   Creatinine 0.61 - 1.24 mg/dL 1.10   Sodium 135 - 145 mmol/L 135   Potassium 3.5 - 5.1 mmol/L 3.6   Chloride 98 - 111 mmol/L 103   CO2 22 - 32 mmol/L 21   Calcium 8.9 - 10.3 mg/dL 9.2    Camelia Phenes, MD 07/02/2022, 11:57 AM  PGY-1, [facility] Intern pager: [phone], text pages welcome Secure chat group [email or web address]

## 2022-07-02 NOTE — TOC Initial Note (Addendum)
Transition of Care [facility]) - Initial/Assessment Note    Patient Details  Name: Matthew [name] MRN: [id number] Date of Birth: 1975/09/18  Transition of Care [facility]) CM/SW Contact:    Benard Halsted, LCSW Phone Number: 07/02/2022, 9:36 AM  Clinical Narrative:                 9:36am-CSW received consult for inpatient psych placement. CSW contacting [place], [place], [place], and [place]. Patient is currently voluntary.   11am-CWW faxed additional clinicals to [facility] as requested by their intake.  12:42pm-CSW met with patient and obtained signed voluntary consent form and faxed to [facility] as requested f. [phone]. [facility] reviewing patient. Patient reported being hopeful for [facility] bed.  Expected Discharge Plan: Psychiatric Hospital Barriers to Discharge: Psych Bed not available   Patient Goals and CMS Choice            Expected Discharge Plan and Services In-house Referral: Clinical Social Work                                            Prior Living Arrangements/Services     Patient language and need for interpreter reviewed:: Yes        Need for Family Participation in Patient Care: No (Comment) Care giver support system in place?: No (comment)   Criminal Activity/Legal Involvement Pertinent to Current Situation/Hospitalization: No - Comment as needed  Activities of Daily Living Home Assistive Devices/Equipment: None ADL Screening (condition at time of admission) Patient's cognitive ability adequate to safely complete daily activities?: Yes Is the patient deaf or have difficulty hearing?: No Does the patient have difficulty seeing, even when wearing glasses/contacts?: No Does the patient have difficulty concentrating, remembering, or making decisions?: No Patient able to express need for assistance with ADLs?: No Does the patient have difficulty dressing or bathing?: No Independently performs ADLs?: Yes (appropriate for developmental age) Does the patient  have difficulty walking or climbing stairs?: No Weakness of Legs: Both Weakness of Arms/Hands: Both  Permission Sought/Granted                  Emotional Assessment Appearance:: Appears stated age     Orientation: : Oriented to Self, Oriented to Place, Oriented to  Time, Oriented to Situation   Psych Involvement: Yes (comment)  Admission diagnosis:  Altered mental status [R41.82] Altered mental status, unspecified altered mental status type [R41.82] Patient Active Problem List   Diagnosis Date Noted   Altered mental status 06/30/2022   Hypothyroid 06/30/2022   AMS (altered mental status) 06/01/2022   Intentional overdose of alprazolam ([place]) 06/01/2022   Insomnia 06/01/2022   Elevated serum creatinine 06/01/2022   Polyneuropathy associated with underlying disease ([place]) 03/20/2022   Type 2 diabetes mellitus with diabetic polyneuropathy, with long-term current use of insulin ([place]) 03/20/2022   S/P shoulder replacement, right 11/28/2021   Atypical chest pain 04/23/2021   Syncope 04/23/2021   Chronic hyponatremia 04/23/2021   Leukocytosis 04/23/2021   Right rib fracture 04/23/2021   High cholesterol    Tobacco abuse    Unstable angina ([place]) 02/24/2021   Right-sided chest pain 09/11/2020   Coronary artery disease of bypass graft of native heart with stable angina pectoris ([employer])    Uncontrolled type 2 diabetes mellitus with hyperglycemia ([place])    Hyponatremia    Essential hypertension    History of hyperprolactinemia 08/16/2020  Mixed dyslipidemia 08/15/2020   Chest pain 06/06/2020   Type 2 diabetes mellitus with hyperglycemia ([place]) 06/06/2020   Hyperkalemia 06/06/2020   Thrombocytopenia ([place]) 06/06/2020   Obesity (BMI 30.0-34.9) 06/06/2020   Non-ST elevation (NSTEMI) myocardial infarction [facility])    Attention deficit hyperactivity disorder (ADHD), combined type, moderate 03/10/2018   Generalized anxiety disorder 03/10/2018   Schizoaffective disorder, bipolar type  without good prognostic features ([place]) 02/09/2015   Morbid obesity ([place]) 01/18/2015   OSA (obstructive sleep apnea) 07/14/2014   Solitary pulmonary nodule 07/14/2014   PCP:  [name], MD Pharmacy:   [id number] [place] [address] [place]Phone: [phone]: [address] [place]Phone: [phone]: [phone] [id number] [id number] [id number]: [phone]: [phone]   Social Determinants of Health (SDOH) Social History: SDOH Screenings   Alcohol Screen: Low Risk  (10/21/2018)  Depression (PHQ2-9): Low Risk  (08/16/2020)  Tobacco Use: High Risk (06/30/2022)   SDOH Interventions:     Readmission Risk Interventions     No data to display

## 2022-07-02 NOTE — TOC Transition Note (Signed)
Transition of Care [facility]) - CM/SW Discharge Note   Patient Details  Name: Matthew [name] MRN: [id number] Date of Birth: 01-26-1976  Transition of Care [facility]) CM/SW Contact:  Benard Halsted, LCSW Phone Number: 07/02/2022, 3:58 PM   Clinical Narrative:    Patient will DC to: [facility] Anticipated DC date: 07/02/22 Family notified: Pt declined and stated he would let his father know Transport by: [facility]   Per MD patient ready for DC to [facility]. RN to call report prior to discharge [phone] bed is [phone]). RN, patient, patient's family, and facility notified of DC. Discharge Summary sent to facility. Existing COVID test acceptable. Transport requested for patient.   CSW will sign off for now as social work intervention is no longer needed. Please consult [place] again if new needs arise.     Final next level of care: Psychiatric Hospital Barriers to Discharge: Barriers Resolved   Patient Goals and CMS Choice   Choice offered to / list presented to : Patient  Discharge Placement                    Name of family member notified: Pt declined Patient and family notified of of transfer: 07/02/22  Discharge Plan and Services Additional resources added to the After Visit Summary for   In-house Referral: Clinical Social Work                                   Social Determinants of Health ([place]) Interventions SDOH Screenings   Alcohol Screen: Low Risk  (10/21/2018)  Depression (PHQ2-9): Low Risk  (08/16/2020)  Tobacco Use: High Risk (06/30/2022)     Readmission Risk Interventions     No data to display

## 2022-07-02 NOTE — Progress Notes (Signed)
Patient ID: Matthew [name], male   DOB: 1975-06-20, 47 y.o.   MRN: [id number] Pt admitted from Medical floor due to suspected intentional overdose. Pt upon admission admits he has long hx of mental illness, and has struggled with anxiety which has been so bad lately it has been '' really impacting my mood. And they adjusted my lithium and it did give me a boost but then they cut it back . '' Patient denies being suicidal or HI but was found obtunded and required medical admission due to overdose. Also of note, pt has large scratch to nose and hands and old blood noted on bottom of feet.  Pt oriented to unit. Cooperative with staff. Pt is safe.

## 2022-07-03 LAB — GLUCOSE, CAPILLARY
Glucose-Capillary: 215 mg/dL — ABNORMAL HIGH (ref 70–99)
Glucose-Capillary: 205 mg/dL — ABNORMAL HIGH (ref 70–99)
Glucose-Capillary: 269 mg/dL — ABNORMAL HIGH (ref 70–99)
Glucose-Capillary: 268 mg/dL — ABNORMAL HIGH (ref 70–99)

## 2022-07-03 MED ORDER — ARIPIPRAZOLE 10 MG PO TABS
10.0000 mg | ORAL_TABLET | Freq: Every day | ORAL | Status: DC
  Administered 2022-07-03: 10 mg via ORAL
  Filled 2022-07-03 (×3): qty 1

## 2022-07-03 MED ORDER — INSULIN GLARGINE-YFGN 100 UNIT/ML ~~LOC~~ SOLN
5.0000 [IU] | Freq: Every day | SUBCUTANEOUS | Status: AC
  Administered 2022-07-03: 5 [IU] via SUBCUTANEOUS

## 2022-07-03 MED ORDER — NICOTINE 14 MG/24HR TD PT24
14.0000 mg | MEDICATED_PATCH | Freq: Every day | TRANSDERMAL | Status: DC
  Administered 2022-07-03: 14 mg via TRANSDERMAL
  Filled 2022-07-03 (×7): qty 1

## 2022-07-03 MED ORDER — OLANZAPINE 10 MG PO TBDP
10.0000 mg | ORAL_TABLET | Freq: Two times a day (BID) | ORAL | Status: DC | PRN
  Administered 2022-07-05: 10 mg via ORAL
  Filled 2022-07-03: qty 1

## 2022-07-03 MED ORDER — PERPHENAZINE 4 MG PO TABS
16.0000 mg | ORAL_TABLET | Freq: Every day | ORAL | Status: DC
  Administered 2022-07-04: 16 mg via ORAL
  Filled 2022-07-03 (×2): qty 4

## 2022-07-03 MED ORDER — TRAZODONE HCL 50 MG PO TABS
50.0000 mg | ORAL_TABLET | Freq: Every day | ORAL | Status: DC
  Administered 2022-07-03: 50 mg via ORAL
  Filled 2022-07-03 (×3): qty 1

## 2022-07-03 MED ORDER — ONDANSETRON 4 MG PO TBDP
ORAL_TABLET | ORAL | Status: AC
  Filled 2022-07-03: qty 1

## 2022-07-03 MED ORDER — HYDROXYZINE HCL 25 MG PO TABS
25.0000 mg | ORAL_TABLET | Freq: Four times a day (QID) | ORAL | Status: DC | PRN
  Administered 2022-07-03 – 2022-07-04 (×2): 25 mg via ORAL
  Filled 2022-07-03 (×2): qty 1

## 2022-07-03 MED ORDER — CLONAZEPAM 0.5 MG PO TABS
0.5000 mg | ORAL_TABLET | Freq: Every day | ORAL | Status: DC
  Administered 2022-07-04 – 2022-07-05 (×2): 0.5 mg via ORAL
  Filled 2022-07-03 (×2): qty 1

## 2022-07-03 MED ORDER — INSULIN ASPART 100 UNIT/ML IJ SOLN
4.0000 [IU] | Freq: Three times a day (TID) | INTRAMUSCULAR | Status: DC
  Administered 2022-07-03 – 2022-07-05 (×6): 4 [IU] via SUBCUTANEOUS

## 2022-07-03 MED ORDER — LITHIUM CARBONATE ER 300 MG PO TBCR
600.0000 mg | EXTENDED_RELEASE_TABLET | Freq: Every day | ORAL | Status: DC
  Administered 2022-07-03: 600 mg via ORAL
  Filled 2022-07-03 (×3): qty 2

## 2022-07-03 MED ORDER — INSULIN GLARGINE-YFGN 100 UNIT/ML ~~LOC~~ SOLN
15.0000 [IU] | Freq: Every morning | SUBCUTANEOUS | Status: DC
  Administered 2022-07-04 – 2022-07-05 (×2): 15 [IU] via SUBCUTANEOUS

## 2022-07-03 MED ORDER — ONDANSETRON 4 MG PO TBDP
4.0000 mg | ORAL_TABLET | ORAL | Status: DC | PRN
  Administered 2022-07-03: 4 mg via ORAL

## 2022-07-03 MED ORDER — GABAPENTIN 400 MG PO CAPS
400.0000 mg | ORAL_CAPSULE | Freq: Three times a day (TID) | ORAL | Status: DC
  Administered 2022-07-03 – 2022-07-05 (×7): 400 mg via ORAL
  Filled 2022-07-03 (×16): qty 1

## 2022-07-03 MED ORDER — OLANZAPINE 10 MG IM SOLR: 10.0000 mg | Freq: Two times a day (BID) | INTRAMUSCULAR | Status: DC | PRN

## 2022-07-03 MED ORDER — PERPHENAZINE 4 MG PO TABS
16.0000 mg | ORAL_TABLET | Freq: Every day | ORAL | Status: DC
  Administered 2022-07-03: 16 mg via ORAL
  Filled 2022-07-03 (×2): qty 4

## 2022-07-03 MED ORDER — TRAZODONE HCL 50 MG PO TABS
50.0000 mg | ORAL_TABLET | Freq: Every evening | ORAL | Status: DC | PRN
  Administered 2022-07-04: 50 mg via ORAL
  Filled 2022-07-03: qty 1

## 2022-07-03 NOTE — BHH Counselor (Signed)
Adult Comprehensive Assessment  Patient ID: Matthew [name], male   DOB: May 30, 1975, 47 y.o.   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, industrial/product in Radiology Employment / Job issues: Pt reports receiving SSDI for a back injury 3 years ago Family Relationships: Pt reports no stressors Financial / Lack of resources (include bankruptcy): Pt reports receiving SSDI, Medicaid, and Medicare Housing / Lack of housing: Pt reports living in his own apartment Physical health (include injuries & life threatening diseases): Pt reports back pain from a previous back injury Social relationships: Pt reports having few social relationships Substance abuse: Pt reports using more Xanax then prescribed Bereavement / Loss: Pt reports no stressors  Living/Environment/Situation:  Living Arrangements: Alone Living conditions (as described by patient or guardian): Apartment/[place] Who else lives in the home?: Alone How long has patient lived in current situation?: 4 years What is atmosphere in current home: Comfortable  Family History:  Marital status: Single Are you sexually active?: No What is your sexual orientation?: Heterosexual Has your sexual activity been affected by drugs, alcohol, medication, or emotional stress?: No Does patient have children?: No  Childhood History:  By whom was/is the patient raised?: Both parents Description of patient's relationship with caregiver when they were a child: "Our relationship did not go well.  There was a lot of strain with my parents" Patient's description of current relationship with people who raised him/her: "Our relationship is good now.   I help take care of them" How were you disciplined when you got in trouble as a child/adolescent?: Spankings and groundings Does patient have siblings?: Yes Number of Siblings: 2 Description of patient's current relationship with siblings: "I have a brother and a sister and we get along OK" Did patient suffer any verbal/emotional/physical/sexual abuse as a child?: No Did patient suffer from severe childhood neglect?: No Has patient ever been sexually abused/assaulted/raped as an adolescent or adult?: No Was the patient ever a victim of a crime or a disaster?: No Witnessed domestic violence?: No Has patient been affected by domestic violence as an adult?: No  Education:  Highest grade of school patient has completed: 12th grade and Associate Degree in Radiology Currently a student?: No Learning disability?: Yes What learning problems does patient have?: ADHD  Employment/Work Situation:   Employment Situation: On disability Why is Patient on Disability: Back Injury from previous employment How Long has Patient Been on Disability: 3 years Patient's Job has Been Impacted by Current Illness: No What is the Longest Time Patient has Held a Job?: 8 years 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 [place]  Has Patient ever Been in the [employer]?: No  Financial Resources:   Museum/gallery curator resources: Teacher, early years/pre, [employer], Medicare Does patient have a Programmer, applications or guardian?: No  Alcohol/Substance Abuse:   What has been your use of drugs/alcohol within the last 12 months?: Pt reports using more Xanax then prescribed If attempted suicide, did drugs/alcohol play a role in this?: No Alcohol/Substance Abuse Treatment Hx: Denies past history Has alcohol/substance abuse ever caused legal problems?: No  Social Support System:   Patient's Community Support System: Fair Dietitian Support System: "My parents, siblings, and a neighbor" Type of  faith/religion: None How does patient's faith help to cope with current illness?: N/A  Leisure/Recreation:   Do You Have Hobbies?: Yes Leisure and Hobbies: "Watching TV and drawing"  Strengths/Needs:   What is the patient's perception of their strengths?: "Being positive and up beat" Patient states they can use these personal strengths during their treatment to contribute to their recovery: "I can see the good side to things" Patient states these barriers may affect/interfere with their treatment: None Patient states these barriers may affect their return to the community: None Other important information patient would like considered in planning for their treatment: None  Discharge Plan:   Currently receiving community mental health services: Yes (From Whom) ([facility]) Patient states concerns and preferences for aftercare planning are: Pt would like to remain with their current providers for therapy and psychiatry Patient states they will know when they are safe and ready for discharge when: "I guess I'll be told" Does patient have access to transportation?: Yes (Father) Does patient have financial barriers related to discharge medications?: No Will patient be returning to same living situation after discharge?: Yes  Summary/Recommendations:   Summary and Recommendations (to be completed by the evaluator): Matthew [name] is a 47 year old, male, who was admitted to the hospital due to worsening depression, anxiety, suicidal thoughts, and an overdose on Xanax.  The Pt reports that this overdose was an accident and "I only took 1 pill at a time until I thought it would help me sleep".  The Pt reports that this same situation also occured 3 weeks ago with the same type of medications.  The Pt reports living in his own apartment and is close with his mother and father.  He states "I help take care of them".  He states that he also has a brother and a sister that he is close  with.  He denies any childhood abuse or trauma at this time.  The Pt reports having an Associate Degree in Radiology and a diagnosis of ADHD.  He reports receiving SSDI for the past 3 years due to a back injury.  He states that he also receives Medicaid and Medicare.  The Pt reports using more Xanax then prescribed and denies any other form of substance use.  He also denies any current or previosus substance use treatment. While in the hospital the Pt can benefit from crisis stabilization, medication evaluation, group therapy, psycho-education, case management, and discharge planning. Upon discharge the Pt would like to return home to his own apartment. It is recommended that the Pt follow-up with his current outpatient providers at [facility] in [place] for therapy and medication management. It is also recommended that the Pt continue to take all medications as prescribed until directed to do otherwise by his providers.  At discharge it is recommended that the patient adhere to the established aftercare plan.  Darleen Crocker. 07/03/2022

## 2022-07-03 NOTE — Group Note (Signed)
LCSW Group Therapy Note   Group Date: 07/03/2022 Start Time: 1100 End Time: 1200  Type of Therapy: Group Therapy: Boundaries  Participation Level: Minimal  Description of Group: This group will address the use of boundaries in their personal lives. Patients will explore why boundaries are important, the difference between healthy and unhealthy boundaries, and negative and positive outcomes of different boundaries and will look at how boundaries can be crossed.  Patients will be encouraged to identify current boundaries in their own lives and identify what kind of boundary is being set. Facilitators will guide patients in utilizing problem-solving interventions to address and correct types of boundaries being used and to address when no boundary is being used. Understanding and applying boundaries will be explored and addressed for obtaining and maintaining a balanced life. Patients will be encouraged to explore ways to assertively make their boundaries and needs known to significant others in their lives, using other group members and facilitator for role play, support, and feedback.   Therapeutic Goals: 1. Patient will identify areas in their life where setting clear boundaries could be used to improve their life.  2. Patient will identify signs/triggers that a boundary is not being respected. 3. Patient will identify two ways to set boundaries in order to achieve balance in their lives: 4. Patient will demonstrate ability to communicate their needs and set boundaries through discussion and/or role plays  Summary of Progress/Problems: The Pt attended group and but left the group early.  The Pt accepted all worksheets and materials provided.  The Pt was appropriate with peers and staff.  The Pt demonstrated understanding of the topic being discussed by asking questions and sharing with their peers.   Darleen Crocker, LCSWA 07/03/2022  1:19 PM

## 2022-07-03 NOTE — Inpatient Diabetes Management (Addendum)
Inpatient Diabetes Program Recommendations  AACE/ADA: New Consensus Statement on Inpatient Glycemic Control (2015)  Target Ranges:  Prepandial:   less than 140 mg/dL      Peak postprandial:   less than 180 mg/dL (1-2 hours)      Critically ill patients:  140 - 180 mg/dL    Latest Reference Range & Units 07/02/22 04:05 07/02/22 07:05 07/02/22 11:12 07/02/22 16:56 07/02/22 20:05  Glucose-Capillary 70 - 99 mg/dL 194 (H) 238 (H)  5 units Novolog @0933$   10 units Semglee @0933$  225 (H)  5 units Novolog @1221$  266 (H)  8 units Novolog  247 (H)  (H): Data is abnormally high  Latest Reference Range & Units 07/03/22 05:54  Glucose-Capillary 70 - 99 mg/dL 215 (H)  5 units Novolog   (H): Data is abnormally high      Home DM Meds: Lantus 25 units daily      Dexcom G7 CGM   Current Orders: Semglee 10 units daily    Novolog 0-15 units TID     MD- Please consider:  1. Increase Semglee insulin to 15 units daily If 10 unit dose already given this AM, please also order Semglee 5 units X 1 to be given this AM as well  2. Start Novolog Meal Coverage: Novolog 4 units TID with meals HOLD if pt NPO HOLD if pt eats <50% meals    --Will follow patient during hospitalization--  Wyn Quaker RN, MSN, [place] Diabetes Coordinator Inpatient Glycemic Control Team Team Pager: [phone] (8a-5p)

## 2022-07-03 NOTE — BHH Group Notes (Signed)
Adult Psychoeducational Group Note  Date:  07/03/2022 Time:  9:46 PM  Group Topic/Focus:  Wrap-Up Group:   The focus of this group is to help patients review their daily goal of treatment and discuss progress on daily workbooks.  Participation Level:  Active  Participation Quality:  Appropriate and Attentive  Affect:  Appropriate  Cognitive:  Alert and Appropriate  Insight: Appropriate and Good  Engagement in Group:  Engaged  Modes of Intervention:  Discussion and Education  Additional Comments:  Pt attended and participated in wrap up group this evening and rated their day an 8/10. Pt stated that they felt anxious this morning and tired due to their inability to sleep last night. Pt completed their goal, which was to talk more and to get out of their room.   [name] 07/03/2022, 9:46 PM

## 2022-07-03 NOTE — BHH Suicide Risk Assessment (Signed)
[place] INPATIENT:  Family/Significant Other Suicide Prevention Education  Suicide Prevention Education:  Education Completed; Alok Mezzanotte [phone] (Father) has been identified by the patient as the family member/significant other with whom the patient will be residing, and identified as the person(s) who will aid the patient in the event of a mental health crisis (suicidal ideations/suicide attempt).  With written consent from the patient, the family member/significant other has been provided the following suicide prevention education, prior to the and/or following the discharge of the patient.  The suicide prevention education provided includes the following: Suicide risk factors Suicide prevention and interventions National Suicide Hotline telephone number [facility] assessment telephone number [facility] Emergency Assistance [place] and/or Residential Mobile Crisis Unit telephone number  Request made of family/significant other to: Remove weapons (e.g., guns, rifles, knives), all items previously/currently identified as safety concern.   Remove drugs/medications (over-the-counter, prescriptions, illicit drugs), all items previously/currently identified as a safety concern.  The family member/significant other verbalizes understanding of the suicide prevention education information provided.  The family member/significant other agrees to remove the items of safety concern listed above.  CSW spoke with Mr. Romine who states that his son will return "back to his own apartment" after discharge.  He states that he visits with his son daily and "I live 3 miles down the road".  He states that his son comes to the home to help him with his mother "she had a stroke recently and he helps me move her and take care of her".  He states that his son was suppose to be "stopping the Xanax but once he got them he decided to take them with his other anxiety medications too".  Mr.  Sotomayor states "I do not believe this was a suicide this time".  Mr. Royce states that there are no firearms or weapons in his son's home.  CSW completed SPE with Mr. Nethercott.   Frutoso Chase Maiko Salais 07/03/2022, 10:50 AM

## 2022-07-03 NOTE — H&P (Cosign Needed Addendum)
Psychiatric Admission Assessment Adult  Patient Identification: Matthew [name] MRN:  [id number] Date of Evaluation:  07/03/2022 Chief Complaint:  MDD (major depressive disorder), recurrent episode, severe ([place]) [F33.2] Principal Diagnosis: Schizoaffective disorder, bipolar type ([place]) Diagnosis:  Principal Problem:   Schizoaffective disorder, bipolar type ([place]) Active Problems:   Generalized anxiety disorder   Obesity (BMI 30.0-34.9)   Uncontrolled type 2 diabetes mellitus with hyperglycemia ([place])   Essential hypertension   High cholesterol   Insomnia   Hypothyroid  CC:"I took a bunch of xanax and other medications because I wanted the anxiety to stop. I was tired of feeling so anxious".  Reason for Admission: Matthew [name] is a 47 yo Caucasian male with a prior mental health history of schizoaffective d/o & GAD who was taken to the [facility] ER by EMS on 2/10 with an altered mental status, related to a Benzodiazepine overdose after his neighbors called emergency services because he was found to be lethargic and "wasn't acting normal". This is the second Benzo overdose in pt since this year, with the prior one being on 06/01/22.  Patient was medically treated and stabilized prior to being transferred voluntarily to this [facility] on 07/02/2022 for treatment and stabilization of his mood.  Mode of transport to Hospital: Safe transport Current Outpatient (Home) Medication List: Lithium, duloxetine, Strattera, trazodone, Sonata, aspirin, atorvastatin, carvedilol, Plavix, Tricor, gabapentin, levothyroxine, perphenazine.  PRN medication prior to evaluation: Milk of Magnesia, Maalox, Tylenol.  ED course: Medically stabilized Collateral Information: None POA/Legal Guardian: Patient is his own guardian  History of Present Illness: During encounter with patient, he reports that prior to his neighbors calling and triggering the events leading to his presentation at the [facility] ER, he was feeling very  anxious, and decided to take the overdose of the Xanax, Lithium and Cymbalta in an effort to stop the anxiety. He denies that it was a suicide attempt, and states that he was "just trying to stop the anxiety". Writer inquired about previous overdose on 1/12, and pt stated: "I was just trying to get high I guess".   Patient however admits that prior to the overdose, he was experiencing worsening of his depressive symptoms; he reports that prior to the first overdose on January 12, he was experiencing depressive symptoms which worsened prior to the second overdose leading to this hospitalization: He reports worsening insomnia, decreased motivation, anhedonia, decreased energy levels, poor appetite, worsening anxiety, poor concentration, as well as feelings of hopelessness, helplessness and worthlessness. He reports intrusive thoughts of death "from time to time".   Patient reports current stressors as not having any job, and therefore not feeling as though he has a lot of purpose in life, with the exception of taking care of his parents.  He reports that even though he enjoys taking care of his parents, it is stressful to do so.  He reports that his mother had a stroke in early 2022, which left her with left-sided paralysis, and she requires assistance with ADLs.  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  Patient reports that his only free time is at night.  He reports that he collects disability income, but feels as though this is not enough, and has thought of going back to work, but feels as though his disability income will be taken away.   Pt reports other stressors as being the several medical problems that  he has. He reports that he has a myocardial infarction in January 2022, and had cardiac stents placed. He reports a history of DM and is on insulin, reports hypothyroidism, and sleep apnea. He states that he does  not require a CPAP and has never used one "because I sleep on my side".   Patient reports that his last manic type symptoms was "several years ago before I was on lithium".  He reports a history of psychosis, and states that his last auditory hallucinations were around Christmas on 2023, and states he also had some thought insertions, thought withdrawals and thought he had special powers then. He reports that the psychosis resolved, and he has not had any since Christmas 2023.   Patient denies having "a lot of anxiety", denies panic attacks, denies any history of physical/emotional/sexual abuse as a child or as an adult.  He denies any history of head trauma, concussions or seizures in the past or currently.  He denies any history of self-injurious behaviors, denies any history of violence, and denies being in legal trouble.  He denies having any obsessions/compulsions.  Past Psychiatric Hx: Previous Psych Diagnoses: Schizoaffective disorder, GAD Prior inpatient treatment: Reports June 01, 2022 when he overdosed on Xanax.  But as per chart review, patient was medically treated and discharged home without spending time in an inpatient behavioral health unit.  Current/prior outpatient treatment: [facility] Prior rehab hx: Denies Psychotherapy hx: Denies History of suicide attempts: 06/01/2022-OD'd on Xanax History of homicide or aggression: Denies  Psychiatric medication history:Risperdal, Zyprexa, Abilify, [name], Klonopin, Gabapentin, Xanax, Valium, Ativan, Hydroxyzine, Trilafon, Atenolol, Cymbalta, Prozac, Lexapro, Paxil, Lithium, Adderall, Ritalin   Psychiatric medication compliance history: Reports compliance Neuromodulation history: Denies Current Psychiatrist: [facility] [place], [place] Current therapist: None  Substance Abuse Hx: Alcohol: Denies use Tobacco: 2 packs/day Illicit drugs: Denies Rx drug abuse: Denies but seems to be overusing benzos  as evidenced by overdoses-states he takes the overdoses in an attempt not to be anxious. Rehab hx: Denies  Past Medical History: Medical Diagnoses: DM type II, sleep apnea, hypothyroidism, history of MI in January 2022. Home Rx: Insulin Lantus, Jardiance, fenofibrate, Plavix, Coreg, metformin, trazodone, Lipitor. Prior Hosp: Prior Surgeries/Trauma: Head trauma, LOC, concussions, seizures:  Allergies: Lactose intolerant, denies medication allergies LMP: N/A Contraception: Denies PCP: Reports that he sees a cardiologist outpatient  Family History: Medical: DM and stroke in mother Psych: Denies, but states that might be present but undiagnosed Psych Rx: Unsure SA/HA: Denies Substance use family hx: Denies  Social History: Patient reports that he lives alone in his apartment, but goes every day to help his parents with the ADLs.  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  He reports that he is the main caregiver for his parents, and states that he has a sister who also helps.  He reports that he is single and has no children.  Legal: Denies Military: Denies  Current Presentation: Pt with flat affect and depressed & very anxious mood. His attention to personal hygiene and grooming is poor, eye contact is good, speech is clear & coherent. Thought contents are organized and logical, and pt currently denies SI/HI/AVH or paranoia. There is no evidence of delusional thoughts.  Pt presents with restlessness and is unable to sit still through entire assessment and changes positions frequently. He states  that this has been ongoing for > 1 yr since he was taken off Adderall after he had an MI in 2022.  Associated Signs/Symptoms: Depression Symptoms:  depressed mood, anhedonia, insomnia, fatigue, feelings of worthlessness/guilt, difficulty  concentrating, hopelessness, recurrent thoughts of death, suicidal attempt, anxiety, loss of energy/fatigue, disturbed sleep, (Hypo) Manic Symptoms:  Impulsivity, Anxiety Symptoms:  Excessive Worry, Psychotic Symptoms:   n/a PTSD Symptoms: NA Total Time spent with patient: 1.5 hours  Is the patient at risk to self? Yes.    Has the patient been a risk to self in the past 6 months? Yes.    Has the patient been a risk to self within the distant past? Yes.    Is the patient a risk to others? No.  Has the patient been a risk to others in the past 6 months? No.  Has the patient been a risk to others within the distant past? No.   [place] Scale:  [place] Admission (Current) from 07/02/2022 in [facility] ED to Hosp-Admission (Discharged) from 06/30/2022 in [facility] ED to Hosp-Admission (Discharged) from 06/01/2022 in [facility] No Risk No Risk No Risk      Alcohol Screening:   Substance Abuse History in the last 12 months:  No. Consequences of Substance Abuse: NA Previous Psychotropic Medications: Yes  Psychological Evaluations: No  Past Medical History:  Past Medical History:  Diagnosis Date   Anxiety    Arthritis    Celiac disease    Coronary artery disease    Dairy allergy    Depression    Diabetes mellitus without complication (HCC)    High cholesterol    Hypertension    Hypothyroidism    Myocardial infarction ([place])    Paranoia ([place])    Schizoaffective disorder ([place])     Past Surgical History:  Procedure Laterality Date   APPENDECTOMY     CARDIAC CATHETERIZATION     CORONARY STENT INTERVENTION N/A 06/06/2020   Procedure: CORONARY STENT INTERVENTION;  Surgeon: Martinique, Peter M, MD;  Location: [facility];  Service: Cardiovascular;  Laterality: N/A;  prox LAD, distal RCA   INTRAVASCULAR ULTRASOUND/IVUS N/A 06/06/2020   Procedure: Intravascular  Ultrasound/IVUS;  Surgeon: Martinique, Peter M, MD;  Location: [facility];  Service: Cardiovascular;  Laterality: N/A;   LEFT HEART CATH AND CORONARY ANGIOGRAPHY N/A 06/06/2020   Procedure: LEFT HEART CATH AND CORONARY ANGIOGRAPHY;  Surgeon: Martinique, Peter M, MD;  Location: [facility];  Service: Cardiovascular;  Laterality: N/A;   LEFT HEART CATH AND CORONARY ANGIOGRAPHY N/A 02/24/2021   Procedure: LEFT HEART CATH AND CORONARY ANGIOGRAPHY;  Surgeon: Belva Crome, MD;  Location: [facility];  Service: Cardiovascular;  Laterality: N/A;   NASAL SEPTUM SURGERY     TOTAL SHOULDER ARTHROPLASTY Right 11/28/2021   Procedure: TOTAL SHOULDER ARTHROPLASTY;  Surgeon: Marchia Bond, MD;  Location: [facility];  Service: Orthopedics;  Laterality: Right;   WRIST FRACTURE SURGERY     right wrist   Family History:  Family History  Problem Relation Age of Onset   Asthma Mother    Hypertension Mother    Hypertension Father    Family Psychiatric  History: Denies  Tobacco Screening:  Social History   Tobacco Use  Smoking Status Some Days   Packs/day: 1.00   Types: Cigarettes, E-cigarettes   Last attempt to quit: 10/19/2021   Years since quitting:  0.7  Smokeless Tobacco Never    BH Tobacco Counseling     Are you interested in Tobacco Cessation Medications?  Yes, implement Nicotene Replacement Protocol Counseled patient on smoking cessation:  Refused/Declined practical counseling Reason Tobacco Screening Not Completed: No value filed.       Social History:  Social History   Substance and Sexual Activity  Alcohol Use No   Alcohol/week: 0.0 standard drinks of alcohol   Comment: no (02/08/15)     Social History   Substance and Sexual Activity  Drug Use No    Additional Social History: Marital status: Single Are you sexually active?: No What is your sexual orientation?: Heterosexual Has your sexual activity been affected by drugs, alcohol, medication, or emotional stress?:  No Does patient have children?: No     Allergies:   Allergies  Allergen Reactions   Gluten Meal Other (See Comments)    Celiac disease   Lactose Intolerance (Gi) Diarrhea    Can tolerate hard cheese   Lab Results:  Results for orders placed or performed during the hospital encounter of 07/02/22 (from the past 48 hour(s))  Glucose, capillary     Status: Abnormal   Collection Time: 07/02/22  4:56 PM  Result Value Ref Range   Glucose-Capillary 266 (H) 70 - 99 mg/dL    Comment: Glucose reference range applies only to samples taken after fasting for at least 8 hours.  Glucose, capillary     Status: Abnormal   Collection Time: 07/02/22  8:05 PM  Result Value Ref Range   Glucose-Capillary 247 (H) 70 - 99 mg/dL    Comment: Glucose reference range applies only to samples taken after fasting for at least 8 hours.  Glucose, capillary     Status: Abnormal   Collection Time: 07/03/22  5:54 AM  Result Value Ref Range   Glucose-Capillary 215 (H) 70 - 99 mg/dL    Comment: Glucose reference range applies only to samples taken after fasting for at least 8 hours.  Glucose, capillary     Status: Abnormal   Collection Time: 07/03/22 12:04 PM  Result Value Ref Range   Glucose-Capillary 205 (H) 70 - 99 mg/dL    Comment: Glucose reference range applies only to samples taken after fasting for at least 8 hours.    Blood Alcohol level:  Lab Results  Component Value Date   ETH <10 06/30/2022   ETH <10 0000000    Metabolic Disorder Labs:  Lab Results  Component Value Date   HGBA1C 8.3 (H) 06/30/2022   MPG 191.51 06/30/2022   MPG 163 11/17/2021   Lab Results  Component Value Date   PROLACTIN 5.4 08/15/2020   PROLACTIN 52.5 (H) 02/10/2015   Lab Results  Component Value Date   CHOL 115 01/29/2022   TRIG 204 (H) 01/29/2022   HDL 27 (L) 01/29/2022   CHOLHDL 4.3 01/29/2022   VLDL UNABLE TO CALCULATE IF TRIGLYCERIDE OVER 400 mg/dL 02/25/2021   LDLCALC 54 01/29/2022   LDLCALC 23  11/02/2021    Current Medications: Current Facility-Administered Medications  Medication Dose Route Frequency Provider Last Rate Last Admin   acetaminophen (TYLENOL) tablet 650 mg  650 mg Oral Q6H PRN Suella Broad, FNP   650 mg at 07/03/22 0802   alum & mag hydroxide-simeth (MAALOX/MYLANTA) 200-200-20 MG/5ML suspension 30 mL  30 mL Oral Q4H PRN Starkes-Perry, Gayland Curry, FNP       ARIPiprazole (ABILIFY) tablet 10 mg  10 mg Oral QHS Janine Limbo, MD  aspirin EC tablet 81 mg  81 mg Oral Daily Suella Broad, FNP   81 mg at 07/03/22 0757   atorvastatin (LIPITOR) tablet 80 mg  80 mg Oral q morning Suella Broad, FNP   80 mg at 07/03/22 1208   carvedilol (COREG) tablet 37.5 mg  37.5 mg Oral BID Suella Broad, FNP   37.5 mg at 07/03/22 1656   clonazePAM (KLONOPIN) tablet 0.5 mg  0.5 mg Oral BID PRN Suella Broad, FNP   0.5 mg at 07/03/22 1255   [START ON 07/04/2022] clonazePAM (KLONOPIN) tablet 0.5 mg  0.5 mg Oral Daily Massengill, Ovid Curd, MD       clopidogrel (PLAVIX) tablet 75 mg  75 mg Oral Daily Suella Broad, FNP   75 mg at 07/03/22 0757   DULoxetine (CYMBALTA) DR capsule 90 mg  90 mg Oral Daily Suella Broad, FNP   90 mg at 07/03/22 0757   fenofibrate tablet 160 mg  160 mg Oral Daily Suella Broad, FNP   160 mg at 07/03/22 0757   gabapentin (NEURONTIN) capsule 400 mg  400 mg Oral Q8H Massengill, Ovid Curd, MD   400 mg at 07/03/22 1529   hydrOXYzine (ATARAX) tablet 25 mg  25 mg Oral Q6H PRN Nicholes Rough, NP       icosapent Ethyl (VASCEPA) 1 g capsule 2 g  2 g Oral BID Suella Broad, FNP   2 g at 07/03/22 1656   insulin aspart (novoLOG) injection 0-15 Units  0-15 Units Subcutaneous TID WC Suella Broad, FNP   5 Units at 07/03/22 1208   insulin aspart (novoLOG) injection 4 Units  4 Units Subcutaneous TID WC Massengill, Ovid Curd, MD       Derrill Memo ON 07/04/2022] insulin glargine-yfgn (SEMGLEE) injection 15  Units  15 Units Subcutaneous q morning Massengill, Ovid Curd, MD       levothyroxine (SYNTHROID) tablet 25 mcg  25 mcg Oral q AM Suella Broad, FNP   25 mcg at 07/03/22 [id number]   lithium carbonate (LITHOBID) ER tablet 600 mg  600 mg Oral Q supper Massengill, Ovid Curd, MD   600 mg at 07/03/22 1656   magnesium hydroxide (MILK OF MAGNESIA) suspension 30 mL  30 mL Oral Daily PRN Suella Broad, FNP       nicotine (NICODERM CQ - dosed in mg/24 hours) patch 14 mg  14 mg Transdermal Daily Massengill, Ovid Curd, MD   14 mg at 07/03/22 1530   nicotine polacrilex (NICORETTE) gum 2 mg  2 mg Oral PRN Suella Broad, FNP   2 mg at 07/03/22 1410   OLANZapine (ZYPREXA) injection 10 mg  10 mg Intramuscular BID PRN Massengill, Ovid Curd, MD       OLANZapine zydis (ZYPREXA) disintegrating tablet 10 mg  10 mg Oral BID PRN Massengill, Ovid Curd, MD       ondansetron (ZOFRAN-ODT) 4 MG disintegrating tablet            ondansetron (ZOFRAN-ODT) disintegrating tablet 4 mg  4 mg Oral Q4H PRN Nicholes Rough, NP   4 mg at 07/03/22 1255   [START ON 07/04/2022] perphenazine (TRILAFON) tablet 16 mg  16 mg Oral Daily Massengill, Nathan, MD       And   perphenazine (TRILAFON) tablet 16 mg  16 mg Oral QHS Massengill, Nathan, MD       traZODone (DESYREL) tablet 50 mg  50 mg Oral QHS Massengill, Ovid Curd, MD       traZODone (DESYREL) tablet 50 mg  50 mg Oral QHS PRN Massengill, Ovid Curd, MD       PTA Medications: Medications Prior to Admission  Medication Sig Dispense Refill Last Dose   aspirin EC 81 MG tablet Take 1 tablet (81 mg total) by mouth daily. Swallow whole. 90 tablet 3    atorvastatin (LIPITOR) 80 MG tablet Take 1 tablet (80 mg total) by mouth every morning. 90 tablet 3    carvedilol (COREG) 25 MG tablet Take 1.5 tablets (37.5 mg total) by mouth 2 (two) times daily. 270 tablet 3    clopidogrel (PLAVIX) 75 MG tablet Take 1 tablet (75 mg total) by mouth daily. 90 tablet 3    Continuous Blood Gluc Sensor (DEXCOM G7  SENSOR) MISC 1 Device by Does not apply route as directed. 9 each 3    DULoxetine (CYMBALTA) 30 MG capsule Take 3 capsules (90 mg total) by mouth daily. 30 capsule 0    fenofibrate (TRICOR) 145 MG tablet Take 1 tablet (145 mg total) by mouth daily. 90 tablet 3    gabapentin (NEURONTIN) 400 MG capsule 2 capsules in the morning, 1 around lunch, 1 late afternoon, 1 at night for total of 5 daily. 150 capsule 0    icosapent Ethyl (VASCEPA) 1 g capsule Take 2 capsules (2 g total) by mouth 2 (two) times daily. 360 capsule 3    insulin glargine (LANTUS SOLOSTAR) 100 UNIT/ML Solostar Pen Inject 25 Units into the skin daily. 30 mL 3    levothyroxine (SYNTHROID) 25 MCG tablet TAKE 1 TABLET IN THE MORNING WITH A GLASS OF WATER, WAIT 1 HOUR BEFORE TAKING OTHER MEDS OR EATING 90 tablet 0    lithium carbonate (LITHOBID) 300 MG ER tablet Take 3 tablets (900 mg total) by mouth at bedtime. (Patient taking differently: Take 900 mg by mouth daily.) 90 tablet 0    nicotine (NICODERM CQ - DOSED IN MG/24 HOURS) 14 mg/24hr patch Place 1 patch (14 mg total) onto the skin daily. (Patient not taking: Reported on 07/01/2022) 28 patch 0    perphenazine (TRILAFON) 16 MG tablet TAKE 1 TABLET BY MOUTH EVERY MORNING AND 2 TABLETS AT BEDTIME (Patient taking differently: Take 32 mg by mouth daily.) 270 tablet 0    Musculoskeletal: Strength & Muscle Tone: within normal limits Gait & Station: normal Patient leans: N/A  Psychiatric Specialty Exam:  Presentation  General Appearance:  Disheveled; Appropriate for Environment  Eye Contact: Good  Speech: Clear and Coherent  Speech Volume: Normal  Handedness: Right   Mood and Affect  Mood: Anxious; Depressed  Affect: Congruent   Thought Process  Thought Processes: Coherent  Duration of Psychotic Symptoms:N/A Past Diagnosis of Schizophrenia or Psychoactive disorder: No data recorded Descriptions of Associations:Intact  Orientation:Full (Time, Place and  Person)  Thought Content:Logical  Hallucinations:Hallucinations: None  Ideas of Reference:None  Suicidal Thoughts:Suicidal Thoughts: No  Homicidal Thoughts:Homicidal Thoughts: No   Sensorium  Memory: Immediate Good  Judgment: Fair  Insight: Fair   Community education officer  Concentration: Fair  Attention Span: Fair  Recall: [place] of Knowledge: Fair  Language: Good   Psychomotor Activity  Psychomotor Activity:Psychomotor Activity: Restlessness   Assets  Assets: Housing   Sleep  Sleep:Sleep: Poor    Physical Exam: Physical Exam Constitutional:      Appearance: He is obese.  HENT:     Head: Normocephalic.  Eyes:     Pupils: Pupils are equal, round, and reactive to light.  Musculoskeletal:        General: Normal range  of motion.  Neurological:     Mental Status: He is alert.    Review of Systems  Constitutional: Negative.   HENT: Negative.    Eyes: Negative.   Respiratory: Negative.    Gastrointestinal: Negative.   Genitourinary: Negative.   Musculoskeletal: Negative.   Skin: Negative.   Neurological: Negative.   Psychiatric/Behavioral:  Positive for depression and hallucinations. Negative for memory loss, substance abuse and suicidal ideas. The patient is nervous/anxious and has insomnia.    Blood pressure (!) 143/88, pulse 93, temperature 98 F (36.7 C), temperature source Oral, resp. rate 20, height 6' 3"$  (1.905 m), weight 116.1 kg, SpO2 98 %. Body mass index is 32 kg/m.  Treatment Plan Summary: Daily contact with patient to assess and evaluate symptoms and progress in treatment and Medication management  Observation Level/Precautions:  15 minute checks  Laboratory:  Labs reviewed   Psychotherapy:  Unit Group sessions  Medications:  See [facility]  Consultations:  To be determined   Discharge Concerns:  Safety, medication compliance, mood stability  Estimated LOS: 5-7 days  Other:  N/A   Labs reviewed 07/03/2022: CBC WNL.  BMP  WNL.  Hemoglobin A1c elevated at 8.3, but patient is diabetic.  TSH WNL.  UDS positive for benzos.  UA with high glucose level, but patient is diabetic.  Last lipid panel was on 01/29/2022.  Will repeat lipid panel.  Other labs ordered: Vitamin B-1, B12, and D.  EKG showing prolonged PR interval and old infarct.  Will repeat after a couple of days.  PLAN Safety and Monitoring: Voluntary admission to inpatient psychiatric unit for safety, stabilization and treatment Daily contact with patient to assess and evaluate symptoms and progress in treatment Patient's case to be discussed in multi-disciplinary team meeting Observation Level : q15 minute checks Vital signs: q12 hours Precautions: Safety  Long Term Goal(s): Improvement in symptoms so as ready for discharge  Short Term Goals: Ability to identify changes in lifestyle to reduce recurrence of condition will improve, Ability to verbalize feelings will improve, Ability to disclose and discuss suicidal ideas, Ability to demonstrate self-control will improve, Ability to identify and develop effective coping behaviors will improve, Ability to maintain clinical measurements within normal limits will improve, Compliance with prescribed medications will improve, and Ability to identify triggers associated with substance abuse/mental health issues will improve  Diagnoses:  Principal Problem:   Schizoaffective disorder, bipolar type ([place]) Active Problems:   Generalized anxiety disorder   Obesity (BMI 30.0-34.9)   Uncontrolled type 2 diabetes mellitus with hyperglycemia ([place])   Essential hypertension   High cholesterol   Insomnia   Hypothyroid  Medications plan tx plan: 1. taper lithium from CR 900 mg with dinner to 600 mg x2 doses, then decr to 300 mg x2 doses, then stop.  2. taper off perphenazine to 16 mg bid today, then taper further.  3. start abilify 10 mg qhs and incr as crosstaper from perphenazine.  3. continue cymbalta 90 mg qd.  5.  dc austedo.  6. dc xanax.  7. start clonazepam 0.5 mg qd.  8. continue clonazepam 0.5 mg bid prn.  9. restart gabapentin 400 mg q8H.  10. hold straterra.  11. start trazodone 50 mg qhs scheduled plus 50 mg qhs prn. 12. start zyprexa 10 mg bid prn oral and IM for agitation   Home medications for medical conditions -Continue Lipitor 80 mg daily for hyperlipidemia -Continue aspirin 81 mg daily -Continue Plavix 75 mg daily for cardiac stents -Continue Vascepa 2  g twice daily for cardiac protection -Continue insulin NovoLog 0 to 15 units as per sliding scale-please see MAR for detailed order -Continue insulin Semglee 15 units every morning for DM -Continue levothyroxine 25 mcg every morning for hypothyroidism -Continue fenofibrate tablets 160 mg daily for hypercholesteremia   Diabetes medications above at the recommendations of the diabetic consultant.  Patient has been educated on all other medications listed above.  Education provided to the patient includes rationales, benefits, and possible side effects of all medications.  Patient verbalized understanding and is agreeable to trials, and medication adjustments for mood stabilization, and anxiety management.  Other PRNS -Continue Tylenol 650 mg every 6 hours PRN for mild pain -Continue Maalox 30 mg every 4 hrs PRN for indigestion -Continue Imodium 2-4 mg as needed for diarrhea -Continue Milk of Magnesia as needed every 6 hrs for constipation -Continue Zofran disintegrating tabs every 6 hrs PRN for nausea   Discharge Planning: Social work and case management to assist with discharge planning and identification of hospital follow-up needs prior to discharge Estimated LOS: 5-7 days Discharge Concerns: Need to establish a safety plan; Medication compliance and effectiveness Discharge Goals: Return home with outpatient referrals for mental health follow-up including medication management/psychotherapy  I certify that inpatient services  furnished can reasonably be expected to improve the patient's condition.    Nicholes Rough, [place] 2/13/20245:07 PM

## 2022-07-03 NOTE — Group Note (Signed)
Recreation Therapy Group Note   Group Topic:Animal Assisted Therapy   Group Date: 07/03/2022 Start Time: 1430 End Time: 1500 Facilitators: Jhase Creppel-McCall, LRT,CTRS Location: [address] Dayroom   Animal-Assisted Activity (AAA) Program Checklist/Progress Notes Patient Eligibility Criteria Checklist & Daily Group note for Rec Tx Intervention  AAA/T Program Assumption of Risk Form signed by Patient/ or Parent Legal Guardian Yes  Patient is free of allergies or severe asthma Yes  Patient reports no fear of animals Yes  Patient reports no history of cruelty to animals Yes  Patient understands his/her participation is voluntary Yes  Patient washes hands before animal contact Yes  Patient washes hands after animal contact Yes   Affect/Mood: Appropriate   Participation Level: Moderate   Participation Quality: Independent   Behavior: Appropriate    Clinical Observations/Individualized Feedback: Patient attended session and interacted appropriately with therapy dog and peers. Patient asked appropriate questions about therapy dog and his training. Patient shared stories about their pets at home with group.     Plan: Continue to engage patient in RT group sessions 2-3x/week.   Macyn Shropshire-McCall, LRT,CTRS 07/03/2022 3:49 PM

## 2022-07-03 NOTE — [facility] Suicide Risk Assessment (Cosign Needed Addendum)
Suicide Risk Assessment  Admission Assessment    [facility] Admission Suicide Risk Assessment   Nursing information obtained from:  Patient Demographic factors:  Male, Caucasian Current Mental Status:  NA Loss Factors:  NA Historical Factors:  Impulsivity Risk Reduction Factors:  Positive coping skills or problem solving skills, Positive therapeutic relationship  Total Time spent with patient: 1.5 hours Principal Problem: Schizoaffective disorder, bipolar type ([place]) Diagnosis:  Principal Problem:   Schizoaffective disorder, bipolar type ([place]) Active Problems:   Generalized anxiety disorder   Obesity (BMI 30.0-34.9)   Uncontrolled type 2 diabetes mellitus with hyperglycemia ([place])   Essential hypertension   High cholesterol   Insomnia   Hypothyroid  Subjective Data: CC:"I took a bunch of xanax and other medications because I wanted the anxiety to stop. I was tired of feeling so anxious".   Continued Clinical Symptoms: Depressive symptoms, anxiety, recent suicide overdose. Currently complains of worsening insomnia, anxiety, feelings of hopelessness, helplessness, worthlessness.  In need of continuous hospitalization for treatment and stabilization of current mood and also for treatment of anxiety.   The "Alcohol Use Disorders Identification Test", Guidelines for Use in Primary Care, Second Edition.  World Pharmacologist [facility]). Score between 0-7:  no or low risk or alcohol related problems. Score between 8-15:  moderate risk of alcohol related problems. Score between 16-19:  high risk of alcohol related problems. Score 20 or above:  warrants further diagnostic evaluation for alcohol dependence and treatment.  CLINICAL FACTORS:   Depression:   Anhedonia Hopelessness Impulsivity Insomnia Severe More than one psychiatric diagnosis  Musculoskeletal: Strength & Muscle Tone: within normal limits Gait & Station: normal Patient leans: N/A  Psychiatric Specialty  Exam:  Presentation  General Appearance:  Disheveled; Appropriate for Environment  Eye Contact: Good  Speech: Clear and Coherent  Speech Volume: Normal  Handedness: Right   Mood and Affect  Mood: Anxious; Depressed  Affect: Congruent   Thought Process  Thought Processes: Coherent  Descriptions of Associations:Intact  Orientation:Full (Time, Place and Person)  Thought Content:Logical  History of Schizophrenia/Schizoaffective disorder:No data recorded Duration of Psychotic Symptoms:No data recorded Hallucinations:Hallucinations: None  Ideas of Reference:None  Suicidal Thoughts:Suicidal Thoughts: No  Homicidal Thoughts:Homicidal Thoughts: No   Sensorium  Memory: Immediate Good  Judgment: Fair  Insight: Fair   Community education officer  Concentration: Fair  Attention Span: Fair  Recall: Scraper of Knowledge: Fair  Language: Good   Psychomotor Activity  Psychomotor Activity: Psychomotor Activity: Restlessness   Assets  Assets: Housing   Sleep  Sleep: Sleep: Poor    Physical Exam: Physical Exam Constitutional:      Appearance: Normal appearance.  HENT:     Head: Normocephalic.     Nose: Nose normal.  Eyes:     Pupils: Pupils are equal, round, and reactive to light.  Musculoskeletal:        General: Normal range of motion.     Cervical back: Normal range of motion.  Neurological:     Mental Status: He is alert and oriented to person, place, and time.    Review of Systems  Constitutional: Negative.   HENT: Negative.    Eyes: Negative.   Respiratory: Negative.    Cardiovascular: Negative.   Gastrointestinal: Negative.   Genitourinary: Negative.   Skin: Negative.   Neurological: Negative.   Psychiatric/Behavioral:  Positive for depression. Negative for hallucinations, memory loss, substance abuse and suicidal ideas. The patient is nervous/anxious and has insomnia.    Blood pressure (!) 143/88, pulse 93,  temperature 98 F (36.7 C), temperature source Oral, resp. rate 20, height 6' 3"$  (1.905 m), weight 116.1 kg, SpO2 98 %. Body mass index is 32 kg/m.   COGNITIVE FEATURES THAT CONTRIBUTE TO RISK:  None    SUICIDE RISK:   Moderate:  Frequent suicidal ideation with limited intensity, and duration, some specificity in terms of plans, no associated intent, good self-control, limited dysphoria/symptomatology, some risk factors present, and identifiable protective factors, including available and accessible social support.  PLAN OF CARE: Please see H & P  I certify that inpatient services furnished can reasonably be expected to improve the patient's condition.   Nicholes Rough, NP 07/03/2022, 5:24 PM

## 2022-07-03 NOTE — [facility] Group Notes (Signed)
Adult Psychoeducational Group Note  Date:  07/03/2022 Time:  3:46 PM  Group Topic/Focus:  Personal Choices and Values:   The focus of this group is to help patients assess and explore the importance of values in their lives, how their values affect their decisions, how they express their values and what opposes their expression.  Participation Level:  Minimal  Participation Quality:  Appropriate  Affect:  Appropriate  Cognitive:  Appropriate  Insight: None  Engagement in Group:  Limited  Modes of Intervention:    Additional Comments:  Group discussed importance of values and boundaries. Patient had limited engagement.  Victorino December 07/03/2022, 3:46 PM

## 2022-07-03 NOTE — Progress Notes (Signed)
Pt medication compliant. Pt endorses high anxiety today (8/10), as well as headache and tremor. Pt denies SI/HI/self harm thoughts. Pt denies a/v hallucinations. Pt endorses abusing benzodiazepines prior to admission. Support and encouragement offered. Fluids encouraged. Q 15 minute checks ongoing for safety.

## 2022-07-03 NOTE — Progress Notes (Signed)
Pt stated he was feeling better this evening    07/03/22 2000  Psych Admission Type (Psych Patients Only)  Admission Status Voluntary  Psychosocial Assessment  Patient Complaints Anxiety  Eye Contact Fair  Facial Expression Sad  Affect Anxious  Speech Soft  Interaction Assertive  Motor Activity Slow  Appearance/Hygiene Unremarkable  Behavior Characteristics Anxious  Mood Anxious  Aggressive Behavior  Effect No apparent injury  Thought Process  Coherency Circumstantial  Content WDL  Delusions WDL  Perception WDL  Hallucination None reported or observed  Judgment WDL  Confusion None  Danger to Self  Current suicidal ideation? Denies  Danger to Others  Danger to Others None reported or observed

## 2022-07-04 ENCOUNTER — Encounter (HOSPITAL_COMMUNITY): Payer: Self-pay

## 2022-07-04 LAB — GLUCOSE, CAPILLARY
Glucose-Capillary: 213 mg/dL — ABNORMAL HIGH (ref 70–99)
Glucose-Capillary: 219 mg/dL — ABNORMAL HIGH (ref 70–99)
Glucose-Capillary: 240 mg/dL — ABNORMAL HIGH (ref 70–99)
Glucose-Capillary: 243 mg/dL — ABNORMAL HIGH (ref 70–99)

## 2022-07-04 LAB — LIPID PANEL
Cholesterol: 136 mg/dL (ref 0–200)
HDL: 19 mg/dL — ABNORMAL LOW (ref >40)
LDL Cholesterol: 68 mg/dL (ref 0–99)
Total CHOL/HDL Ratio: 7.2 RATIO
Triglycerides: 247 mg/dL — ABNORMAL HIGH (ref <150)
VLDL: 49 mg/dL — ABNORMAL HIGH (ref 0–40)

## 2022-07-04 LAB — VITAMIN B12: Vitamin B-12: 1142 pg/mL — ABNORMAL HIGH (ref 180–914)

## 2022-07-04 MED ORDER — LITHIUM CARBONATE ER 300 MG PO TBCR
300.0000 mg | EXTENDED_RELEASE_TABLET | Freq: Every day | ORAL | Status: AC
  Administered 2022-07-05 – 2022-07-06 (×2): 300 mg via ORAL
  Filled 2022-07-04 (×2): qty 1

## 2022-07-04 MED ORDER — TRAZODONE HCL 100 MG PO TABS
100.0000 mg | ORAL_TABLET | Freq: Every day | ORAL | Status: DC
  Administered 2022-07-04 – 2022-07-05 (×2): 100 mg via ORAL
  Filled 2022-07-04 (×5): qty 1

## 2022-07-04 MED ORDER — ARIPIPRAZOLE 15 MG PO TABS
15.0000 mg | ORAL_TABLET | Freq: Every day | ORAL | Status: DC
  Administered 2022-07-04: 15 mg via ORAL
  Filled 2022-07-04 (×4): qty 1

## 2022-07-04 MED ORDER — PERPHENAZINE 8 MG PO TABS
8.0000 mg | ORAL_TABLET | Freq: Every day | ORAL | Status: DC
  Administered 2022-07-05: 8 mg via ORAL
  Filled 2022-07-04 (×3): qty 1

## 2022-07-04 MED ORDER — PERPHENAZINE 8 MG PO TABS
12.0000 mg | ORAL_TABLET | Freq: Every day | ORAL | Status: DC
  Administered 2022-07-04: 12 mg via ORAL
  Filled 2022-07-04 (×3): qty 1

## 2022-07-04 MED ORDER — LITHIUM CARBONATE ER 300 MG PO TBCR
600.0000 mg | EXTENDED_RELEASE_TABLET | Freq: Every day | ORAL | Status: AC
  Administered 2022-07-04: 600 mg via ORAL
  Filled 2022-07-04: qty 2

## 2022-07-04 NOTE — Progress Notes (Signed)
[facility] MD Progress Note  07/04/2022 5:06 PM Matthew [name]  MRN:  [id number] Principal Problem: Schizoaffective disorder, bipolar type [facility]) Diagnosis: Principal Problem:   Schizoaffective disorder, bipolar type ([place]) Active Problems:   Generalized anxiety disorder   Obesity (BMI 30.0-34.9)   Uncontrolled type 2 diabetes mellitus with hyperglycemia ([place])   Essential hypertension   High cholesterol   Insomnia   Hypothyroid  Reason for Admission: Matthew Harker. [name] is a 47 yo Caucasian male with a prior mental health history of schizoaffective d/o & GAD who was taken to the [facility] ER by EMS on 2/10 with an altered mental status, related to a Benzodiazepine overdose after his neighbors called emergency services because he was found to be lethargic and "wasn't acting normal". This is the second Benzo overdose in pt since this year, with the prior one being on 06/01/22.  Patient was medically treated and stabilized prior to being transferred voluntarily to this [facility] on 07/02/2022 for treatment and stabilization of his mood.   24 hr chart review: V/S WNL. Pt has been medication compliant. Required Hydroxyzine 25 mg last night for anxiety, also required Klonopin 0.5 mg for anxiety. No behavioral episodes noted in the past 24 hrs, pt has been participating in unit activities in the past 24 hrs.  Patient assessment note, 07/04/2022: Pt is less depressed and less anxious today as compared to yesterday as per both subjective and objective assessments. He reports that current medication regimen is helping him with his mood and also with anxiety. He is less restless today and is able to sit for entire assessment without changing positions unlike yesterday when he needed to change positions or walk around every few seconds. Attention to personal hygiene and grooming today has improved as compared to yesterday.  Pt denies SI, denies HI, denies AVH, denies paranoia and there is no evidence of delusional thinking.  Pt reports that his sleep quality last night was poor as he was up at 0300 and did not go back to sleep until 0500. Pt educated by attending Psychiatrist to ask for Hydroxyzine if he gets up again earlier than 0600. Pt complaining of back pain, but states that this is chronic, and that Gabapentin is helpful.  We are continuing to taper Lithium off and also decreasing Trilafon to 12 mg tonight and to 8 mg daily starting tomorrow. Pt's Abilify increased to 15 mg starting tonight at 2200. We are continuing to require continuous hospitalization for pt as it remains necessary to continue to monitor his symptoms while medications are being adjusted.  Total Time spent with patient: 45 minutes  Past Psychiatric History: See H & P  Past Medical History:  Past Medical History:  Diagnosis Date   Anxiety    Arthritis    Celiac disease    Coronary artery disease    Dairy allergy    Depression    Diabetes mellitus without complication (HCC)    High cholesterol    Hypertension    Hypothyroidism    Myocardial infarction ([place])    Paranoia ([place])    Schizoaffective disorder ([place])     Past Surgical History:  Procedure Laterality Date   APPENDECTOMY     CARDIAC CATHETERIZATION     CORONARY STENT INTERVENTION N/A 06/06/2020   Procedure: CORONARY STENT INTERVENTION;  Surgeon: Martinique, Peter M, MD;  Location: [place] [facility];  Service: Cardiovascular;  Laterality: N/A;  prox LAD, distal RCA   INTRAVASCULAR ULTRASOUND/IVUS N/A 06/06/2020   Procedure: Intravascular Ultrasound/IVUS;  Surgeon: Martinique, Peter M, MD;  Location: [place] [facility];  Service: Cardiovascular;  Laterality: N/A;   LEFT HEART CATH AND CORONARY ANGIOGRAPHY N/A 06/06/2020   Procedure: LEFT HEART CATH AND CORONARY ANGIOGRAPHY;  Surgeon: Martinique, Peter M, MD;  Location: [place] [facility];  Service: Cardiovascular;  Laterality: N/A;   LEFT HEART CATH AND CORONARY ANGIOGRAPHY N/A 02/24/2021   Procedure: LEFT HEART CATH AND CORONARY  ANGIOGRAPHY;  Surgeon: Belva Crome, MD;  Location: [place] [facility];  Service: Cardiovascular;  Laterality: N/A;   NASAL SEPTUM SURGERY     TOTAL SHOULDER ARTHROPLASTY Right 11/28/2021   Procedure: TOTAL SHOULDER ARTHROPLASTY;  Surgeon: Marchia Bond, MD;  Location: [facility];  Service: Orthopedics;  Laterality: Right;   WRIST FRACTURE SURGERY     right wrist   Family History:  Family History  Problem Relation Age of Onset   Asthma Mother    Hypertension Mother    Hypertension Father    Family Psychiatric  History: See H & P Social History:  Social History   Substance and Sexual Activity  Alcohol Use No   Alcohol/week: 0.0 standard drinks of alcohol   Comment: no (02/08/15)     Social History   Substance and Sexual Activity  Drug Use No    Social History   Socioeconomic History   Marital status: Single    Spouse name: Not on file   Number of children: 0   Years of education: 14   Highest education level: Associate degree: occupational, Hotel manager, or vocational program  Occupational History   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, multimedia, unemployed  Tobacco Use   Smoking status: Some Days    Packs/day: 1.00    Types: Cigarettes, E-cigarettes    Last attempt to quit: 10/19/2021    Years since quitting: 0.7   Smokeless tobacco: Never  Vaping Use   Vaping Use: Former  Substance and Sexual Activity   Alcohol use: No    Alcohol/week: 0.0 standard drinks of alcohol    Comment: no (02/08/15)   Drug use: No   Sexual activity: Not Currently  Other Topics Concern   Not on file  Social History Narrative   Pt lives at the [place] Determinants of Health   Financial Resource Strain: Not on file  Food Insecurity: No Food Insecurity (07/02/2022)   Hunger Vital Sign    Worried About Running Out of Food in the Last Year: Never true    Ran Out of Food in the Last Year: Never true  Transportation Needs: No Transportation Needs (07/02/2022)   PRAPARE - Radiographer, therapeutic (Medical): No    Lack of Transportation (Non-Medical): No  Physical Activity: Not on file  Stress: Not on file  Social Connections: Not on file   Sleep: Poor  Appetite:  Good  Current Medications: Current Facility-Administered Medications  Medication Dose Route Frequency Provider Last Rate Last Admin   acetaminophen (TYLENOL) tablet 650 mg  650 mg Oral Q6H PRN Suella Broad, FNP   650 mg at 07/03/22 1822   alum & mag hydroxide-simeth (MAALOX/MYLANTA) 200-200-20 MG/5ML suspension 30 mL  30 mL Oral Q4H PRN Starkes-Perry, Gayland Curry, FNP       ARIPiprazole (ABILIFY) tablet 15 mg  15 mg Oral QHS Massengill, Nathan, MD       aspirin EC tablet 81 mg  81 mg Oral Daily Suella Broad, FNP   81 mg at 07/04/22 0801   atorvastatin (LIPITOR)  tablet 80 mg  80 mg Oral q morning Suella Broad, FNP   80 mg at 07/04/22 1014   carvedilol (COREG) tablet 37.5 mg  37.5 mg Oral BID Suella Broad, FNP   37.5 mg at 07/04/22 0801   clonazePAM (KLONOPIN) tablet 0.5 mg  0.5 mg Oral BID PRN Suella Broad, FNP   0.5 mg at 07/04/22 1458   clonazePAM (KLONOPIN) tablet 0.5 mg  0.5 mg Oral Daily Massengill, Ovid Curd, MD   0.5 mg at 07/04/22 0800   clopidogrel (PLAVIX) tablet 75 mg  75 mg Oral Daily Suella Broad, FNP   75 mg at 07/04/22 0801   DULoxetine (CYMBALTA) DR capsule 90 mg  90 mg Oral Daily Suella Broad, FNP   90 mg at 07/04/22 0801   fenofibrate tablet 160 mg  160 mg Oral Daily Suella Broad, FNP   160 mg at 07/04/22 0801   gabapentin (NEURONTIN) capsule 400 mg  400 mg Oral Q8H Massengill, Ovid Curd, MD   400 mg at 07/04/22 1405   hydrOXYzine (ATARAX) tablet 25 mg  25 mg Oral Q6H PRN Nicholes Rough, NP   25 mg at 07/03/22 2101   icosapent Ethyl (VASCEPA) 1 g capsule 2 g  2 g Oral BID Suella Broad, FNP   2 g at 07/04/22 0801   insulin aspart (novoLOG) injection 0-15 Units  0-15 Units Subcutaneous TID WC Suella Broad, FNP   5 Units at 07/04/22 1248   insulin aspart (novoLOG) injection 4 Units  4 Units Subcutaneous TID WC Massengill, Ovid Curd, MD   4 Units at 07/04/22 1248   insulin glargine-yfgn (SEMGLEE) injection 15 Units  15 Units Subcutaneous q morning Massengill, Ovid Curd, MD   15 Units at 07/04/22 1035   levothyroxine (SYNTHROID) tablet 25 mcg  25 mcg Oral q AM Suella Broad, FNP   25 mcg at 07/04/22 0616   [START ON 07/05/2022] lithium carbonate (LITHOBID) ER tablet 300 mg  300 mg Oral Q supper Massengill, Ovid Curd, MD       lithium carbonate (LITHOBID) ER tablet 600 mg  600 mg Oral Q supper Massengill, Ovid Curd, MD       magnesium hydroxide (MILK OF MAGNESIA) suspension 30 mL  30 mL Oral Daily PRN Starkes-Perry, Gayland Curry, FNP       nicotine (NICODERM CQ - dosed in mg/24 hours) patch 14 mg  14 mg Transdermal Daily Massengill, Ovid Curd, MD   14 mg at 07/03/22 1530   nicotine polacrilex (NICORETTE) gum 2 mg  2 mg Oral PRN Suella Broad, FNP   2 mg at 07/04/22 1459   OLANZapine (ZYPREXA) injection 10 mg  10 mg Intramuscular BID PRN Massengill, Ovid Curd, MD       OLANZapine zydis (ZYPREXA) disintegrating tablet 10 mg  10 mg Oral BID PRN Massengill, Ovid Curd, MD       ondansetron (ZOFRAN-ODT) disintegrating tablet 4 mg  4 mg Oral Q4H PRN Nicholes Rough, NP   4 mg at 07/03/22 1255   [START ON 07/05/2022] perphenazine (TRILAFON) tablet 8 mg  8 mg Oral Daily Massengill, Nathan, MD       And   perphenazine (TRILAFON) tablet 12 mg  12 mg Oral QHS Massengill, Nathan, MD       traZODone (DESYREL) tablet 100 mg  100 mg Oral QHS Massengill, Nathan, MD       traZODone (DESYREL) tablet 50 mg  50 mg Oral QHS PRN Janine Limbo, MD  Lab Results:  Results for orders placed or performed during the hospital encounter of 07/02/22 (from the past 48 hour(s))  Glucose, capillary     Status: Abnormal   Collection Time: 07/02/22  8:05 PM  Result Value Ref Range   Glucose-Capillary 247 (H) 70 - 99 mg/dL     Comment: Glucose reference range applies only to samples taken after fasting for at least 8 hours.  Glucose, capillary     Status: Abnormal   Collection Time: 07/03/22  5:54 AM  Result Value Ref Range   Glucose-Capillary 215 (H) 70 - 99 mg/dL    Comment: Glucose reference range applies only to samples taken after fasting for at least 8 hours.  Glucose, capillary     Status: Abnormal   Collection Time: 07/03/22 12:04 PM  Result Value Ref Range   Glucose-Capillary 205 (H) 70 - 99 mg/dL    Comment: Glucose reference range applies only to samples taken after fasting for at least 8 hours.  Glucose, capillary     Status: Abnormal   Collection Time: 07/03/22  5:02 PM  Result Value Ref Range   Glucose-Capillary 269 (H) 70 - 99 mg/dL    Comment: Glucose reference range applies only to samples taken after fasting for at least 8 hours.  Glucose, capillary     Status: Abnormal   Collection Time: 07/03/22  7:50 PM  Result Value Ref Range   Glucose-Capillary 268 (H) 70 - 99 mg/dL    Comment: Glucose reference range applies only to samples taken after fasting for at least 8 hours.  Glucose, capillary     Status: Abnormal   Collection Time: 07/04/22  6:00 AM  Result Value Ref Range   Glucose-Capillary 213 (H) 70 - 99 mg/dL    Comment: Glucose reference range applies only to samples taken after fasting for at least 8 hours.  Lipid panel     Status: Abnormal   Collection Time: 07/04/22  6:34 AM  Result Value Ref Range   Cholesterol 136 0 - 200 mg/dL   Triglycerides 247 (H) <150 mg/dL   HDL 19 (L) >40 mg/dL   Total CHOL/HDL Ratio 7.2 RATIO   VLDL 49 (H) 0 - 40 mg/dL   LDL Cholesterol 68 0 - 99 mg/dL    Comment:        Total Cholesterol/HDL:CHD Risk Coronary Heart Disease Risk Table                     Men   Women  1/2 Average Risk   3.4   3.3  Average Risk       5.0   4.4  2 X Average Risk   9.6   7.1  3 X Average Risk  23.4   11.0        Use the calculated Patient Ratio above and the CHD  Risk Table to determine the patient's CHD Risk.        ATP III CLASSIFICATION (LDL):  <100     mg/dL   Optimal  100-129  mg/dL   Near or Above                    Optimal  130-159  mg/dL   Borderline  160-189  mg/dL   High  >190     mg/dL   Very High Performed at [facility] [address] [place], [place] [address]   Vitamin B12     Status: Abnormal  Collection Time: 07/04/22  6:34 AM  Result Value Ref Range   Vitamin B-12 1,142 (H) 180 - 914 pg/mL    Comment: (NOTE) This assay is not validated for testing neonatal or myeloproliferative syndrome specimens for Vitamin B12 levels. Performed at [facility], [place] [address], [place], [place] [address]   Glucose, capillary     Status: Abnormal   Collection Time: 07/04/22 12:04 PM  Result Value Ref Range   Glucose-Capillary 219 (H) 70 - 99 mg/dL    Comment: Glucose reference range applies only to samples taken after fasting for at least 8 hours.  Glucose, capillary     Status: Abnormal   Collection Time: 07/04/22  4:11 PM  Result Value Ref Range   Glucose-Capillary 240 (H) 70 - 99 mg/dL    Comment: Glucose reference range applies only to samples taken after fasting for at least 8 hours.    Blood Alcohol level:  Lab Results  Component Value Date   ETH <10 06/30/2022   ETH <10 0000000    Metabolic Disorder Labs: Lab Results  Component Value Date   HGBA1C 8.3 (H) 06/30/2022   MPG 191.51 06/30/2022   MPG 163 11/17/2021   Lab Results  Component Value Date   PROLACTIN 5.4 08/15/2020   PROLACTIN 52.5 (H) 02/10/2015   Lab Results  Component Value Date   CHOL 136 07/04/2022   TRIG 247 (H) 07/04/2022   HDL 19 (L) 07/04/2022   CHOLHDL 7.2 07/04/2022   VLDL 49 (H) 07/04/2022   LDLCALC 68 07/04/2022   LDLCALC 54 01/29/2022    Physical Findings: AIMS:  , ,  ,  ,    CIWA:    COWS:     Musculoskeletal: Strength & Muscle Tone: within normal limits Gait & Station:  normal Patient leans: N/A  Psychiatric Specialty Exam:  Presentation  General Appearance:  Appropriate for Environment; Fairly Groomed  Eye Contact: Good  Speech: Clear and Coherent  Speech Volume: Normal  Handedness: Right   Mood and Affect  Mood: Depressed; Anxious  Affect: Congruent   Thought Process  Thought Processes: Coherent  Descriptions of Associations:Intact  Orientation:Full (Time, Place and Person)  Thought Content:Logical  History of Schizophrenia/Schizoaffective disorder:No data recorded Duration of Psychotic Symptoms:No data recorded Hallucinations:Hallucinations: None  Ideas of Reference:None  Suicidal Thoughts:Suicidal Thoughts: No  Homicidal Thoughts:Homicidal Thoughts: No   Sensorium  Memory: Immediate Good  Judgment: Fair  Insight: Fair   Community education officer  Concentration: Fair  Attention Span: Fair  Recall: Leal of Knowledge: Good  Language: Good   Psychomotor Activity  Psychomotor Activity: Psychomotor Activity: Normal   Assets  Assets: Communication Skills   Sleep  Sleep: Sleep: Poor    Physical Exam: Physical Exam Constitutional:      Appearance: Normal appearance.  HENT:     Head: Normocephalic.     Nose: Nose normal.  Eyes:     Pupils: Pupils are equal, round, and reactive to light.  Pulmonary:     Effort: Pulmonary effort is normal.  Neurological:     Mental Status: He is alert and oriented to person, place, and time.    Review of Systems  Constitutional:  Negative for fever.  HENT: Negative.    Eyes: Negative.   Respiratory: Negative.    Cardiovascular: Negative.   Gastrointestinal: Negative.   Genitourinary: Negative.   Musculoskeletal: Negative.   Skin: Negative.   Neurological: Negative.   Psychiatric/Behavioral:  Positive for depression and substance abuse. Negative for  hallucinations, memory loss and suicidal ideas. The patient is nervous/anxious and has  insomnia.    Blood pressure 125/86, pulse 95, temperature 98 F (36.7 C), temperature source Oral, resp. rate 20, height 6' 3"$  (1.905 m), weight 116.1 kg, SpO2 97 %. Body mass index is 32 kg/m.  Treatment Plan Summary: Daily contact with patient to assess and evaluate symptoms and progress in treatment and Medication management   Observation Level/Precautions:  15 minute checks  Laboratory:  Labs reviewed   Psychotherapy:  Unit Group sessions  Medications:  See [facility]  Consultations:  To be determined   Discharge Concerns:  Safety, medication compliance, mood stability  Estimated LOS: 5-7 days  Other:  N/A    PLAN Safety and Monitoring: Voluntary admission to inpatient psychiatric unit for safety, stabilization and treatment Daily contact with patient to assess and evaluate symptoms and progress in treatment Patient's case to be discussed in multi-disciplinary team meeting Observation Level : q15 minute checks Vital signs: q12 hours Precautions: Safety   Long Term Goal(s): Improvement in symptoms so as ready for discharge   Short Term Goals: Ability to identify changes in lifestyle to reduce recurrence of condition will improve, Ability to verbalize feelings will improve, Ability to disclose and discuss suicidal ideas, Ability to demonstrate self-control will improve, Ability to identify and develop effective coping behaviors will improve, Ability to maintain clinical measurements within normal limits will improve, Compliance with prescribed medications will improve, and Ability to identify triggers associated with substance abuse/mental health issues will improve   Diagnoses:  Principal Problem:   Schizoaffective disorder, bipolar type ([place]) Active Problems:   Generalized anxiety disorder   Obesity (BMI 30.0-34.9)   Uncontrolled type 2 diabetes mellitus with hyperglycemia ([place])   Essential hypertension   High cholesterol   Insomnia   Hypothyroid   Medications plan tx plan:  1. Continue tapering lithium from CR 900 mg with dinner to 600 mg x2 doses, then decr to 300 mg x2 doses, then stop-See MAR for details   2.Continue tapering  off perphenazine: to 12 mg tonight (2/14), and 8 mg daily tomorrow (2/15).   3. Increase Abilify to 15 mg qhs for mood stabilization 3. Continue cymbalta 90 mg qd.  5. dc'd austedo on admission.  6. Dc'd  xanax on admission 7. Continue clonazepam 0.5 mg qd for GAD 8. Continue clonazepam 0.5 mg bid prn for GAD.  9. Continue gabapentin 400 mg q8H for GAD/Pain 10.Continue to hold straterra.  11. Continue trazodone 50 mg qhs scheduled plus 50 mg qhs prn.  12. Continue zyprexa 10 mg bid prn oral and IM for agitation    Home medications for medical conditions -Continue Lipitor 80 mg daily for hyperlipidemia -Continue aspirin 81 mg daily -Continue Plavix 75 mg daily for cardiac stents -Continue Vascepa 2 g twice daily for cardiac protection -Continue insulin NovoLog 0 to 15 units as per sliding scale-please see MAR for detailed order -Continue insulin Semglee 15 units every morning for DM -Continue levothyroxine 25 mcg every morning for hypothyroidism -Continue fenofibrate tablets 160 mg daily for hypercholesteremia     Diabetes medications above at the recommendations of the diabetic consultant.  Patient has been educated on all other medications listed above.  Education provided to the patient includes rationales, benefits, and possible side effects of all medications.  Patient verbalized understanding and is agreeable to trials, and medication adjustments for mood stabilization, and anxiety management.   Other PRNS -Continue Tylenol 650 mg every 6 hours PRN for mild  pain -Continue Maalox 30 mg every 4 hrs PRN for indigestion -Continue Imodium 2-4 mg as needed for diarrhea -Continue Milk of Magnesia as needed every 6 hrs for constipation -Continue Zofran disintegrating tabs every 6 hrs PRN for nausea    Discharge Planning: Social  work and case management to assist with discharge planning and identification of hospital follow-up needs prior to discharge Estimated LOS: 5-7 days Discharge Concerns: Need to establish a safety plan; Medication compliance and effectiveness Discharge Goals: Return home with outpatient referrals for mental health follow-up including medication management/psychotherapy   I certify that inpatient services furnished can reasonably be expected to improve the patient's condition.      Nicholes Rough, NP 07/04/2022, 5:06 PM

## 2022-07-04 NOTE — Group Note (Signed)
Recreation Therapy Group Note   Group Topic:Other  Group Date: 07/04/2022 Start Time: 0930 End Time: 1000 Facilitators: Tyaisha Cullom-McCall, LRT,CTRS Location: [address] Dayroom   Goal Area(s) Addresses: Patient will actively participate in group activity. Patient will display ability to work with peers in activity.   Group Description: Valentine's Trivia.  LRT asked patients questions dealing with romantic movies and Valentine's in general in celebration of the holiday.  Patients had the choice of working individually or in small groups to come up with answers to the questions.    Affect/Mood: Appropriate   Participation Level: Active   Participation Quality: Independent   Behavior: Appropriate   Speech/Thought Process: Focused   Insight: Good   Judgement: Good   Modes of Intervention: Activity   Patient Response to Interventions:  Engaged   Education Outcome:  Acknowledges education and In group clarification offered    Clinical Observations/Individualized Feedback: Pt was attentive and engaged in group session.  Pt was appropriate and focused during group session.  Pt was called out of group but later returned as group was wrapping up.    Plan: Continue to engage patient in RT group sessions 2-3x/week.   Jasaun Carn-McCall, LRT,CTRS 07/04/2022 1:15 PM

## 2022-07-04 NOTE — Group Note (Unsigned)
Date:  07/04/2022 Time:  10:12 AM  Group Topic/Focus:  Orientation:   The focus of this group is to educate the patient on the purpose and policies of crisis stabilization and provide a format to answer questions about their admission.  The group details unit policies and expectations of patients while admitted.     Participation Level:  {BHH PARTICIPATION WO:[id number]  Participation Quality:  {BHH PARTICIPATION QUALITY:22265}  Affect:  {BHH AFFECT:22266}  Cognitive:  {BHH COGNITIVE:22267}  Insight: {BHH Insight2:20797}  Engagement in Group:  {BHH ENGAGEMENT IN BP:[id number]  Modes of Intervention:  {BHH MODES OF INTERVENTION:22269}  Additional Comments:  ***  Matthew [name] 07/04/2022, 10:12 AM

## 2022-07-04 NOTE — BH IP Treatment Plan (Signed)
Interdisciplinary Treatment and Diagnostic Plan Update  07/04/2022 Time of Session: 9:35am SHERWIN ECKRICH MRN: [id number]  Principal Diagnosis: Schizoaffective disorder, bipolar type [facility])  Secondary Diagnoses: Principal Problem:   Schizoaffective disorder, bipolar type ([place]) Active Problems:   Generalized anxiety disorder   Obesity (BMI 30.0-34.9)   Uncontrolled type 2 diabetes mellitus with hyperglycemia ([place])   Essential hypertension   High cholesterol   Insomnia   Hypothyroid   Current Medications:  Current Facility-Administered Medications  Medication Dose Route Frequency Provider Last Rate Last Admin   acetaminophen (TYLENOL) tablet 650 mg  650 mg Oral Q6H PRN Suella Broad, FNP   650 mg at 07/03/22 1822   alum & mag hydroxide-simeth (MAALOX/MYLANTA) 200-200-20 MG/5ML suspension 30 mL  30 mL Oral Q4H PRN Starkes-Perry, Gayland Curry, FNP       ARIPiprazole (ABILIFY) tablet 15 mg  15 mg Oral QHS Massengill, Nathan, MD       aspirin EC tablet 81 mg  81 mg Oral Daily Suella Broad, FNP   81 mg at 07/04/22 0801   atorvastatin (LIPITOR) tablet 80 mg  80 mg Oral q morning Suella Broad, FNP   80 mg at 07/04/22 1014   carvedilol (COREG) tablet 37.5 mg  37.5 mg Oral BID Suella Broad, FNP   37.5 mg at 07/04/22 0801   clonazePAM (KLONOPIN) tablet 0.5 mg  0.5 mg Oral BID PRN Suella Broad, FNP   0.5 mg at 07/03/22 1255   clonazePAM (KLONOPIN) tablet 0.5 mg  0.5 mg Oral Daily Massengill, Nathan, MD   0.5 mg at 07/04/22 0800   clopidogrel (PLAVIX) tablet 75 mg  75 mg Oral Daily Suella Broad, FNP   75 mg at 07/04/22 0801   DULoxetine (CYMBALTA) DR capsule 90 mg  90 mg Oral Daily Suella Broad, FNP   90 mg at 07/04/22 0801   fenofibrate tablet 160 mg  160 mg Oral Daily Suella Broad, FNP   160 mg at 07/04/22 0801   gabapentin (NEURONTIN) capsule 400 mg  400 mg Oral Q8H Massengill, Ovid Curd, MD   400 mg at 07/04/22 0616    hydrOXYzine (ATARAX) tablet 25 mg  25 mg Oral Q6H PRN Nicholes Rough, NP   25 mg at 07/03/22 2101   icosapent Ethyl (VASCEPA) 1 g capsule 2 g  2 g Oral BID Suella Broad, FNP   2 g at 07/04/22 0801   insulin aspart (novoLOG) injection 0-15 Units  0-15 Units Subcutaneous TID WC Suella Broad, FNP   5 Units at 07/04/22 0617   insulin aspart (novoLOG) injection 4 Units  4 Units Subcutaneous TID WC Massengill, Ovid Curd, MD   4 Units at 07/04/22 0618   insulin glargine-yfgn (SEMGLEE) injection 15 Units  15 Units Subcutaneous q morning Massengill, Ovid Curd, MD   15 Units at 07/04/22 1035   levothyroxine (SYNTHROID) tablet 25 mcg  25 mcg Oral q AM Suella Broad, FNP   25 mcg at 07/04/22 0616   [START ON 07/05/2022] lithium carbonate (LITHOBID) ER tablet 300 mg  300 mg Oral Q supper Massengill, Ovid Curd, MD       lithium carbonate (LITHOBID) ER tablet 600 mg  600 mg Oral Q supper Massengill, Nathan, MD       magnesium hydroxide (MILK OF MAGNESIA) suspension 30 mL  30 mL Oral Daily PRN Starkes-Perry, Gayland Curry, FNP       nicotine (NICODERM CQ - dosed in mg/24 hours) patch 14 mg  14  mg Transdermal Daily Massengill, Ovid Curd, MD   14 mg at 07/03/22 1530   nicotine polacrilex (NICORETTE) gum 2 mg  2 mg Oral PRN Suella Broad, FNP   2 mg at 07/04/22 1015   OLANZapine (ZYPREXA) injection 10 mg  10 mg Intramuscular BID PRN Massengill, Ovid Curd, MD       OLANZapine zydis (ZYPREXA) disintegrating tablet 10 mg  10 mg Oral BID PRN Massengill, Ovid Curd, MD       ondansetron (ZOFRAN-ODT) disintegrating tablet 4 mg  4 mg Oral Q4H PRN Nicholes Rough, NP   4 mg at 07/03/22 1255   [START ON 07/05/2022] perphenazine (TRILAFON) tablet 8 mg  8 mg Oral Daily Massengill, Nathan, MD       And   perphenazine (TRILAFON) tablet 12 mg  12 mg Oral QHS Massengill, Ovid Curd, MD       traZODone (DESYREL) tablet 100 mg  100 mg Oral QHS Massengill, Ovid Curd, MD       traZODone (DESYREL) tablet 50 mg  50 mg Oral QHS PRN  Massengill, Ovid Curd, MD       PTA Medications: Medications Prior to Admission  Medication Sig Dispense Refill Last Dose   aspirin EC 81 MG tablet Take 1 tablet (81 mg total) by mouth daily. Swallow whole. 90 tablet 3    atorvastatin (LIPITOR) 80 MG tablet Take 1 tablet (80 mg total) by mouth every morning. 90 tablet 3    carvedilol (COREG) 25 MG tablet Take 1.5 tablets (37.5 mg total) by mouth 2 (two) times daily. 270 tablet 3    clopidogrel (PLAVIX) 75 MG tablet Take 1 tablet (75 mg total) by mouth daily. 90 tablet 3    Continuous Blood Gluc Sensor (DEXCOM G7 SENSOR) MISC 1 Device by Does not apply route as directed. 9 each 3    DULoxetine (CYMBALTA) 30 MG capsule Take 3 capsules (90 mg total) by mouth daily. 30 capsule 0    fenofibrate (TRICOR) 145 MG tablet Take 1 tablet (145 mg total) by mouth daily. 90 tablet 3    gabapentin (NEURONTIN) 400 MG capsule 2 capsules in the morning, 1 around lunch, 1 late afternoon, 1 at night for total of 5 daily. 150 capsule 0    icosapent Ethyl (VASCEPA) 1 g capsule Take 2 capsules (2 g total) by mouth 2 (two) times daily. 360 capsule 3    insulin glargine (LANTUS SOLOSTAR) 100 UNIT/ML Solostar Pen Inject 25 Units into the skin daily. 30 mL 3    levothyroxine (SYNTHROID) 25 MCG tablet TAKE 1 TABLET IN THE MORNING WITH A GLASS OF WATER, WAIT 1 HOUR BEFORE TAKING OTHER MEDS OR EATING 90 tablet 0    lithium carbonate (LITHOBID) 300 MG ER tablet Take 3 tablets (900 mg total) by mouth at bedtime. (Patient taking differently: Take 900 mg by mouth daily.) 90 tablet 0    nicotine (NICODERM CQ - DOSED IN MG/24 HOURS) 14 mg/24hr patch Place 1 patch (14 mg total) onto the skin daily. (Patient not taking: Reported on 07/01/2022) 28 patch 0    perphenazine (TRILAFON) 16 MG tablet TAKE 1 TABLET BY MOUTH EVERY MORNING AND 2 TABLETS AT BEDTIME (Patient taking differently: Take 32 mg by mouth daily.) 270 tablet 0     Patient Stressors:    Patient Strengths:    Treatment  Modalities: Medication Management, Group therapy, Case management,  1 to 1 session with clinician, Psychoeducation, Recreational therapy.   Physician Treatment Plan for Primary Diagnosis: Schizoaffective disorder, bipolar type ([place]) Long  Term Goal(s): Improvement in symptoms so as ready for discharge   Short Term Goals: Ability to identify changes in lifestyle to reduce recurrence of condition will improve Ability to verbalize feelings will improve Ability to disclose and discuss suicidal ideas Ability to demonstrate self-control will improve Ability to identify and develop effective coping behaviors will improve Ability to maintain clinical measurements within normal limits will improve Compliance with prescribed medications will improve Ability to identify triggers associated with substance abuse/mental health issues will improve  Medication Management: Evaluate patient's response, side effects, and tolerance of medication regimen.  Therapeutic Interventions: 1 to 1 sessions, Unit Group sessions and Medication administration.  Evaluation of Outcomes: Progressing  Physician Treatment Plan for Secondary Diagnosis: Principal Problem:   Schizoaffective disorder, bipolar type ([place]) Active Problems:   Generalized anxiety disorder   Obesity (BMI 30.0-34.9)   Uncontrolled type 2 diabetes mellitus with hyperglycemia ([facility])   Essential hypertension   High cholesterol   Insomnia   Hypothyroid  Long Term Goal(s): Improvement in symptoms so as ready for discharge   Short Term Goals: Ability to identify changes in lifestyle to reduce recurrence of condition will improve Ability to verbalize feelings will improve Ability to disclose and discuss suicidal ideas Ability to demonstrate self-control will improve Ability to identify and develop effective coping behaviors will improve Ability to maintain clinical measurements within normal limits will improve Compliance with prescribed  medications will improve Ability to identify triggers associated with substance abuse/mental health issues will improve     Medication Management: Evaluate patient's response, side effects, and tolerance of medication regimen.  Therapeutic Interventions: 1 to 1 sessions, Unit Group sessions and Medication administration.  Evaluation of Outcomes: Progressing   RN Treatment Plan for Primary Diagnosis: Schizoaffective disorder, bipolar type ([place]) Long Term Goal(s): Knowledge of disease and therapeutic regimen to maintain health will improve  Short Term Goals: Ability to remain free from injury will improve, Ability to verbalize frustration and anger appropriately will improve, Ability to demonstrate self-control, Ability to participate in decision making will improve, Ability to verbalize feelings will improve, Ability to disclose and discuss suicidal ideas, Ability to identify and develop effective coping behaviors will improve, and Compliance with prescribed medications will improve  Medication Management: RN will administer medications as ordered by provider, will assess and evaluate patient's response and provide education to patient for prescribed medication. RN will report any adverse and/or side effects to prescribing provider.  Therapeutic Interventions: 1 on 1 counseling sessions, Psychoeducation, Medication administration, Evaluate responses to treatment, Monitor vital signs and CBGs as ordered, Perform/monitor CIWA, COWS, AIMS and Fall Risk screenings as ordered, Perform wound care treatments as ordered.  Evaluation of Outcomes: Progressing   LCSW Treatment Plan for Primary Diagnosis: Schizoaffective disorder, bipolar type ([place]) Long Term Goal(s): Safe transition to appropriate next level of care at discharge, Engage patient in therapeutic group addressing interpersonal concerns.  Short Term Goals: Engage patient in aftercare planning with referrals and resources, Increase social  support, Increase ability to appropriately verbalize feelings, Increase emotional regulation, Facilitate acceptance of mental health diagnosis and concerns, Facilitate patient progression through stages of change regarding substance use diagnoses and concerns, Identify triggers associated with mental health/substance abuse issues, and Increase skills for wellness and recovery  Therapeutic Interventions: Assess for all discharge needs, 1 to 1 time with Social worker, Explore available resources and support systems, Assess for adequacy in community support network, Educate family and significant other(s) on suicide prevention, Complete Psychosocial Assessment, Interpersonal group therapy.  Evaluation of  Outcomes: Progressing   Progress in Treatment: Attending groups: Yes. Participating in groups: Yes. Taking medication as prescribed: Yes. Toleration medication: Yes. Family/Significant other contact made: Yes, individual(s) contacted:  Dasani Speicher [phone] (Father) Patient understands diagnosis: Yes. Discussing patient identified problems/goals with staff: Yes. Medical problems stabilized or resolved: Yes. Denies suicidal/homicidal ideation: Yes. Issues/concerns per patient self-inventory: No.   New problem(s) identified: No, Describe:  none reported   New Short Term/Long Term Goal(s):   medication stabilization, elimination of SI thoughts, development of comprehensive mental wellness plan.    Patient Goals:  Pt states, "I want to attend groups and get more involved in treatment."  Discharge Plan or Barriers:  Patient recently admitted. CSW will continue to follow and assess for appropriate referrals and possible discharge planning.    Reason for Continuation of Hospitalization: Depression Medication stabilization Suicidal ideation  Estimated Length of Stay: 1-3 days  Last 3 [place] Suicide Severity Risk Score: [place] [facility] Admission (Current) from 07/02/2022 in [facility] ED to Hosp-Admission (Discharged) from 06/30/2022 in [place] [facility] ED to Hosp-Admission (Discharged) from 06/01/2022 in [place] No Risk No Risk No Risk       Last PHQ 2/9 Scores:    08/16/2020    1:40 PM  Depression screen PHQ 2/9  Decreased Interest 0  Down, Depressed, Hopeless 0  PHQ - 2 Score 0    Scribe for Treatment Team: Zachery Conch, LCSW 07/04/2022 11:49 AM

## 2022-07-04 NOTE — Group Note (Unsigned)
Date:  07/04/2022 Time:  10:01 AM  Group Topic/Focus:  Orientation:   The focus of this group is to educate the patient on the purpose and policies of crisis stabilization and provide a format to answer questions about their admission.  The group details unit policies and expectations of patients while admitted.     Participation Level:  {BHH PARTICIPATION WO:[id number]  Participation Quality:  {BHH PARTICIPATION QUALITY:22265}  Affect:  {BHH AFFECT:22266}  Cognitive:  {BHH COGNITIVE:22267}  Insight: {BHH Insight2:20797}  Engagement in Group:  {BHH ENGAGEMENT IN BP:[id number]  Modes of Intervention:  {BHH MODES OF INTERVENTION:22269}  Additional Comments:  ***  Garvin Fila 07/04/2022, 10:01 AM

## 2022-07-04 NOTE — Group Note (Signed)
Date:  07/04/2022 Time:  5:02 PM  Group Topic/Focus:  Wellness Toolbox:   The focus of this group is to discuss various aspects of wellness, balancing those aspects and exploring ways to increase the ability to experience wellness.  Patients will create a wellness toolbox for use upon discharge.    Participation Level:  Active  Participation Quality:  Appropriate  Affect:  Appropriate  Cognitive:  Appropriate  Insight: Appropriate  Engagement in Group:  Engaged  Modes of Intervention:  Discussion  Additional Comments:  Pt participated in music therapy  Garvin Fila 07/04/2022, 5:02 PM

## 2022-07-04 NOTE — Plan of Care (Signed)
  Problem: Health Behavior/Discharge Planning: Goal: Compliance with therapeutic regimen will improve Outcome: Progressing   Problem: Self-Concept: Goal: Level of anxiety will decrease Outcome: Progressing   Problem: Safety: Goal: Periods of time without injury will increase Outcome: Progressing

## 2022-07-04 NOTE — Progress Notes (Signed)
Patient observed awake early this morning. Patient stated he had a hard time remaining asleep. Patient denied any pain. Patient denies SI,HI, and A/V/H with no plan or intent. Patient states his anxiety is 4 and depression a 3 out of 10. Patient bs this morning was 213. Patient compliant with all scheduled medication and allowed a bedtime snack per MD. Patient cooperative on unit and socializing appropriately with others.

## 2022-07-05 LAB — GLUCOSE, CAPILLARY
Glucose-Capillary: 226 mg/dL — ABNORMAL HIGH (ref 70–99)
Glucose-Capillary: 272 mg/dL — ABNORMAL HIGH (ref 70–99)
Glucose-Capillary: 265 mg/dL — ABNORMAL HIGH (ref 70–99)

## 2022-07-05 LAB — MISC LABCORP TEST (SEND OUT): Labcorp test code: 81950

## 2022-07-05 MED ORDER — GABAPENTIN 400 MG PO CAPS
400.0000 mg | ORAL_CAPSULE | Freq: Four times a day (QID) | ORAL | Status: DC
  Administered 2022-07-05 – 2022-07-07 (×8): 400 mg via ORAL
  Filled 2022-07-05 (×12): qty 1

## 2022-07-05 MED ORDER — INSULIN GLARGINE-YFGN 100 UNIT/ML ~~LOC~~ SOLN
18.0000 [IU] | Freq: Every morning | SUBCUTANEOUS | Status: DC
  Administered 2022-07-06 – 2022-07-07 (×2): 18 [IU] via SUBCUTANEOUS

## 2022-07-05 MED ORDER — PERPHENAZINE 4 MG PO TABS
4.0000 mg | ORAL_TABLET | Freq: Every day | ORAL | Status: AC
  Administered 2022-07-06: 4 mg via ORAL
  Filled 2022-07-05: qty 1

## 2022-07-05 MED ORDER — CLONAZEPAM 0.25 MG PO TBDP
0.2500 mg | ORAL_TABLET | Freq: Two times a day (BID) | ORAL | Status: DC
  Administered 2022-07-05 – 2022-07-07 (×4): 0.25 mg via ORAL
  Filled 2022-07-05 (×4): qty 1

## 2022-07-05 MED ORDER — INSULIN ASPART 100 UNIT/ML IJ SOLN
7.0000 [IU] | Freq: Three times a day (TID) | INTRAMUSCULAR | Status: DC
  Administered 2022-07-05 – 2022-07-07 (×6): 7 [IU] via SUBCUTANEOUS

## 2022-07-05 MED ORDER — PERPHENAZINE 4 MG PO TABS
4.0000 mg | ORAL_TABLET | Freq: Every day | ORAL | Status: AC
  Administered 2022-07-05: 4 mg via ORAL
  Filled 2022-07-05: qty 1

## 2022-07-05 MED ORDER — ARIPIPRAZOLE 10 MG PO TABS
20.0000 mg | ORAL_TABLET | Freq: Every day | ORAL | Status: DC
  Administered 2022-07-05 – 2022-07-06 (×2): 20 mg via ORAL
  Filled 2022-07-05 (×3): qty 2

## 2022-07-05 NOTE — Progress Notes (Addendum)
Patient is observed pacing hallway early morning. Patient stated he had a hard time falling asleep last night and only slept about 3.5 hours. Patient denies SI,HI, and A/V/H with no plan or intent. Patient med compliant. Patient received tylenol po prn due to pain which provided some relief. Patient remains cooperative and socializes appropriately in milieu.   1830: Patient received klonopin po prn due to anxiety.

## 2022-07-05 NOTE — Group Note (Signed)
LCSW Group Therapy Note   Group Date: 07/05/2022 Start Time: 1100 End Time: 1200  Type of Therapy and Topic:  Group Therapy - Healthy vs Unhealthy Coping Skills  Participation Level:  Did Not Attend   Description of Group The focus of this group was to determine what unhealthy coping techniques typically are used by group members and what healthy coping techniques would be helpful in coping with various problems. Patients were guided in becoming aware of the differences between healthy and unhealthy coping techniques. Patients were asked to identify 2-3 healthy coping skills they would like to learn to use more effectively.  Therapeutic Goals Patients learned that coping is what human beings do all day long to deal with various situations in their lives Patients defined and discussed healthy vs unhealthy coping techniques Patients identified their preferred coping techniques and identified whether these were healthy or unhealthy Patients determined 2-3 healthy coping skills they would like to become more familiar with and use more often. Patients provided support and ideas to each other   Summary of Patient Progress:  Did not attend    Therapeutic Modalities Cognitive Behavioral Therapy Motivational Interviewing  Darleen Crocker, [place] 07/05/2022  1:05 PM

## 2022-07-05 NOTE — BHH Group Notes (Signed)
[place] Group Notes:  (Nursing/MHT/Case Management/Adjunct)  Date:  07/05/2022  Time:  2:32 PM  Type of Therapy:  Group Therapy  Participation Level:  Active  Participation Quality:  Appropriate  Affect:  Appropriate  Cognitive:  Appropriate  Insight:  Appropriate  Engagement in Group:  Engaged  Modes of Intervention:  Discussion  Summary of Progress/Problems:  Patient attended and participated in a [place] group today.   Elza Rafter 07/05/2022, 2:32 PM

## 2022-07-05 NOTE — Progress Notes (Signed)
   07/05/22 2200  Psych Admission Type (Psych Patients Only)  Admission Status Voluntary  Psychosocial Assessment  Patient Complaints Restlessness;Anxiety  Eye Contact Fair  Facial Expression Animated;Anxious  Affect Anxious;Depressed  Speech Logical/coherent  Interaction Intrusive  Motor Activity Restless;Fidgety  Appearance/Hygiene Unremarkable  Behavior Characteristics Cooperative;Restless  Mood Anxious;Pleasant  Thought Process  Coherency WDL  Content WDL  Delusions None reported or observed  Perception WDL  Hallucination None reported or observed  Judgment Limited  Confusion None  Danger to Self  Current suicidal ideation? Denies  Agreement Not to Harm Self Yes  Description of Agreement verbal  Danger to Others  Danger to Others None reported or observed

## 2022-07-05 NOTE — Progress Notes (Signed)
   07/04/22 2000  Psych Admission Type (Psych Patients Only)  Admission Status Voluntary  Psychosocial Assessment  Patient Complaints Anxiety;Insomnia  Eye Contact Fair  Facial Expression Anxious  Affect Anxious;Depressed  Speech Logical/coherent  Interaction Assertive  Motor Activity Pacing  Appearance/Hygiene Unremarkable  Behavior Characteristics Cooperative  Mood Pleasant  Thought Process  Coherency WDL  Content WDL  Delusions None reported or observed  Perception WDL  Hallucination None reported or observed  Judgment Limited  Confusion None  Danger to Self  Current suicidal ideation?  (Denies)  Agreement Not to Harm Self Yes  Description of Agreement verbal  Danger to Others  Danger to Others None reported or observed   Patient pacing most of the evening C/O anxiety and sleep disturbance stated that at home he takes 300 mg of Seroquel but only sleeps until 3 am. Prn Trazodone 50 mg given at HS and not effective. Prn Klonopin 0.5 mg given at 0100 with little effect. Support and encouragement provided.

## 2022-07-05 NOTE — Progress Notes (Addendum)
[facility] MD Progress Note  07/05/2022 1:02 PM Matthew [name]  MRN:  [id number]  Subjective:    Matthew [name] is a 47 yo Caucasian male with a prior mental health history of schizoaffective d/o & GAD who was taken to the [facility] ER by EMS on 2/10 with an altered mental status, related to a Benzodiazepine overdose after his neighbors called emergency services because he was found to be lethargic and "wasn't acting normal". This is the second Benzo overdose in pt since this year, with the prior one being on 06/01/22.  Patient was medically treated and stabilized prior to being transferred voluntarily to this [facility] on 07/02/2022 for treatment and stabilization of his mood.    Per nursing, patient had poor sleep last night, only getting about 3 hours, up pacing at night.  On my exam with the patient today, he reports that overall restlessness is less, he does report difficulty returning to sleep after waking up after about 3 hours last night.  He reports falling sleep was not difficult for him.  He reports taking Atarax as needed, but this was not efficacious to fall back asleep.  He reports Klonopin is helpful for this.  We discussed changing his 0.5 mg dose in the morning, splitting this into to 0.25 mg doses, 1 at noon, and 1 in the evening, and monitor for response for anxiety and insomnia. I discussed with the patient, and due to his suicide attempt on benzodiazepines, twice, medications with his back of action are going to be of limited availability to him, even if they are effective, due to risk of overdose.  He voiced understanding of this. He otherwise reports his mood is less depressed but still dysphoric.  He reports still feeling anxious, and irritable on and off.  He denies any change of abnormal muscle movements or noticed any kind of jaw clenching since also discontinuation.  He reports that appetite is okay.  Concentration is okay.  Denies any SI or HI.  Denies any AH, VH or  paranoia.  Overall patient appears to be responding well to multiple adjustments of medication including weaning off the lithium, trial cross titration from trial up onto Abilify, and discontinuing Xanax.    Principal Problem: Schizoaffective disorder, bipolar type ([place]) Diagnosis: Principal Problem:   Schizoaffective disorder, bipolar type ([place]) Active Problems:   Generalized anxiety disorder   Obesity (BMI 30.0-34.9)   Uncontrolled type 2 diabetes mellitus with hyperglycemia ([place])   Essential hypertension   High cholesterol   Insomnia   Hypothyroid  Total Time spent with patient: 15 minutes  Past Psychiatric History:  Previous Psych Diagnoses: Schizoaffective disorder, GAD Prior inpatient treatment: Reports June 01, 2022 when he overdosed on Xanax.  But as per chart review, patient was medically treated and discharged home without spending time in an inpatient behavioral health unit. Current/prior outpatient treatment: [facility] Prior rehab hx: Denies Psychotherapy hx: Denies History of suicide attempts: 06/01/2022-OD'd on Xanax History of homicide or aggression: Denies   Psychiatric medication history:Risperdal, Zyprexa, Abilify, [name], Klonopin, Gabapentin, Xanax, Valium, Ativan, Hydroxyzine, Trilafon, Atenolol, Cymbalta, Prozac, Lexapro, Paxil, Lithium, Adderall, Ritalin    Psychiatric medication compliance history: Reports compliance Neuromodulation history: Denies Current Psychiatrist: [facility] [place], [place] Current therapist: None   Past Medical History:  Past Medical History:  Diagnosis Date   Anxiety    Arthritis    Celiac disease    Coronary artery disease    Dairy allergy    Depression  Diabetes mellitus without complication (HCC)    High cholesterol    Hypertension    Hypothyroidism    Myocardial infarction ([place])    Paranoia ([place])    Schizoaffective disorder ([place])     Past Surgical History:  Procedure  Laterality Date   APPENDECTOMY     CARDIAC CATHETERIZATION     CORONARY STENT INTERVENTION N/A 06/06/2020   Procedure: CORONARY STENT INTERVENTION;  Surgeon: Martinique, Peter M, MD;  Location: [place] [facility];  Service: Cardiovascular;  Laterality: N/A;  prox LAD, distal RCA   INTRAVASCULAR ULTRASOUND/IVUS N/A 06/06/2020   Procedure: Intravascular Ultrasound/IVUS;  Surgeon: Martinique, Peter M, MD;  Location: [place] [facility];  Service: Cardiovascular;  Laterality: N/A;   LEFT HEART CATH AND CORONARY ANGIOGRAPHY N/A 06/06/2020   Procedure: LEFT HEART CATH AND CORONARY ANGIOGRAPHY;  Surgeon: Martinique, Peter M, MD;  Location: [place] [facility];  Service: Cardiovascular;  Laterality: N/A;   LEFT HEART CATH AND CORONARY ANGIOGRAPHY N/A 02/24/2021   Procedure: LEFT HEART CATH AND CORONARY ANGIOGRAPHY;  Surgeon: Belva Crome, MD;  Location: [place] [facility];  Service: Cardiovascular;  Laterality: N/A;   NASAL SEPTUM SURGERY     TOTAL SHOULDER ARTHROPLASTY Right 11/28/2021   Procedure: TOTAL SHOULDER ARTHROPLASTY;  Surgeon: Marchia Bond, MD;  Location: [facility];  Service: Orthopedics;  Laterality: Right;   WRIST FRACTURE SURGERY     right wrist   Family History:  Family History  Problem Relation Age of Onset   Asthma Mother    Hypertension Mother    Hypertension Father    Family Psychiatric  History:  Medical: DM and stroke in mother Psych: Denies, but states that might be present but undiagnosed Psych Rx: Unsure SA/HA: Denies Substance use family hx: Denies   Social History:  Social History   Substance and Sexual Activity  Alcohol Use No   Alcohol/week: 0.0 standard drinks of alcohol   Comment: no (02/08/15)     Social History   Substance and Sexual Activity  Drug Use No    Social History   Socioeconomic History   Marital status: Single    Spouse name: Not on file   Number of children: 0   Years of education: 14   Highest education level: Associate degree: occupational,  Hotel manager, or vocational program  Occupational History   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, multimedia, unemployed  Tobacco Use   Smoking status: Some Days    Packs/day: 1.00    Types: Cigarettes, E-cigarettes    Last attempt to quit: 10/19/2021    Years since quitting: 0.7   Smokeless tobacco: Never  Vaping Use   Vaping Use: Former  Substance and Sexual Activity   Alcohol use: No    Alcohol/week: 0.0 standard drinks of alcohol    Comment: no (02/08/15)   Drug use: No   Sexual activity: Not Currently  Other Topics Concern   Not on file  Social History Narrative   Pt lives at the [place] Determinants of Health   Financial Resource Strain: Not on file  Food Insecurity: No Food Insecurity (07/02/2022)   Hunger Vital Sign    Worried About Running Out of Food in the Last Year: Never true    [place] in the Last Year: Never true  Transportation Needs: No Transportation Needs (07/02/2022)   PRAPARE - Hydrologist (Medical): No    Lack of Transportation (Non-Medical): No  Physical Activity: Not on file  Stress: Not on file  Social Connections: Not on file   Additional Social History:                         Sleep: Poor  Appetite:  Fair  Current Medications: Current Facility-Administered Medications  Medication Dose Route Frequency Provider Last Rate Last Admin   acetaminophen (TYLENOL) tablet 650 mg  650 mg Oral Q6H PRN Suella Broad, FNP   650 mg at 07/05/22 1026   alum & mag hydroxide-simeth (MAALOX/MYLANTA) 200-200-20 MG/5ML suspension 30 mL  30 mL Oral Q4H PRN Starkes-Perry, Gayland Curry, FNP       ARIPiprazole (ABILIFY) tablet 20 mg  20 mg Oral QHS Mammie Meras, MD       aspirin EC tablet 81 mg  81 mg Oral Daily Suella Broad, FNP   81 mg at 07/05/22 0815   atorvastatin (LIPITOR) tablet 80 mg  80 mg Oral q morning Suella Broad, FNP   80 mg at 07/05/22 0951   carvedilol (COREG) tablet 37.5 mg  37.5 mg Oral BID  Suella Broad, FNP   37.5 mg at 07/05/22 0813   clonazePAM (KLONOPIN) disintegrating tablet 0.25 mg  0.25 mg Oral BID Teegan Guinther, Ovid Curd, MD       clonazePAM Bobbye [place]) tablet 0.5 mg  0.5 mg Oral BID PRN Suella Broad, FNP   0.5 mg at 07/04/22 1458   clopidogrel (PLAVIX) tablet 75 mg  75 mg Oral Daily Suella Broad, FNP   75 mg at 07/05/22 0817   DULoxetine (CYMBALTA) DR capsule 90 mg  90 mg Oral Daily Suella Broad, FNP   90 mg at 07/05/22 0815   fenofibrate tablet 160 mg  160 mg Oral Daily Suella Broad, FNP   160 mg at 07/05/22 0814   gabapentin (NEURONTIN) capsule 400 mg  400 mg Oral Q8H Diamond Martucci, Ovid Curd, MD   400 mg at 07/05/22 [id number]   hydrOXYzine (ATARAX) tablet 25 mg  25 mg Oral Q6H PRN Nicholes Rough, NP   25 mg at 07/04/22 2116   icosapent Ethyl (VASCEPA) 1 g capsule 2 g  2 g Oral BID Suella Broad, FNP   2 g at 07/05/22 0813   insulin aspart (novoLOG) injection 0-15 Units  0-15 Units Subcutaneous TID WC Suella Broad, FNP   8 Units at 07/05/22 1252   insulin aspart (novoLOG) injection 4 Units  4 Units Subcutaneous TID WC Preethi Scantlebury, Ovid Curd, MD   4 Units at 07/05/22 1253   insulin glargine-yfgn (SEMGLEE) injection 15 Units  15 Units Subcutaneous q morning Julanne Schlueter, Ovid Curd, MD   15 Units at 07/05/22 0953   levothyroxine (SYNTHROID) tablet 25 mcg  25 mcg Oral q AM Suella Broad, FNP   25 mcg at 07/05/22 [id number]   lithium carbonate (LITHOBID) ER tablet 300 mg  300 mg Oral Q supper Tira Lafferty, Ovid Curd, MD       magnesium hydroxide (MILK OF MAGNESIA) suspension 30 mL  30 mL Oral Daily PRN Starkes-Perry, Gayland Curry, FNP       nicotine (NICODERM CQ - dosed in mg/24 hours) patch 14 mg  14 mg Transdermal Daily Selene Peltzer, Ovid Curd, MD   14 mg at 07/03/22 1530   nicotine polacrilex (NICORETTE) gum 2 mg  2 mg Oral PRN Suella Broad, FNP   2 mg at 07/05/22 1258   OLANZapine (ZYPREXA) injection 10 mg  10 mg Intramuscular BID PRN  Janine Limbo, MD  OLANZapine zydis (ZYPREXA) disintegrating tablet 10 mg  10 mg Oral BID PRN Milynn Quirion, Ovid Curd, MD       ondansetron (ZOFRAN-ODT) disintegrating tablet 4 mg  4 mg Oral Q4H PRN Nicholes Rough, NP   4 mg at 07/03/22 1255   [START ON 07/06/2022] perphenazine (TRILAFON) tablet 4 mg  4 mg Oral Daily Markeshia Giebel, MD       And   perphenazine (TRILAFON) tablet 4 mg  4 mg Oral QHS Crist Kruszka, MD       traZODone (DESYREL) tablet 100 mg  100 mg Oral QHS Beola Vasallo, Ovid Curd, MD   100 mg at 07/04/22 2112   traZODone (DESYREL) tablet 50 mg  50 mg Oral QHS PRN Gatlin Kittell, Ovid Curd, MD   50 mg at 07/04/22 2238    Lab Results:  Results for orders placed or performed during the hospital encounter of 07/02/22 (from the past 48 hour(s))  Glucose, capillary     Status: Abnormal   Collection Time: 07/03/22  5:02 PM  Result Value Ref Range   Glucose-Capillary 269 (H) 70 - 99 mg/dL    Comment: Glucose reference range applies only to samples taken after fasting for at least 8 hours.  Glucose, capillary     Status: Abnormal   Collection Time: 07/03/22  7:50 PM  Result Value Ref Range   Glucose-Capillary 268 (H) 70 - 99 mg/dL    Comment: Glucose reference range applies only to samples taken after fasting for at least 8 hours.  Glucose, capillary     Status: Abnormal   Collection Time: 07/04/22  6:00 AM  Result Value Ref Range   Glucose-Capillary 213 (H) 70 - 99 mg/dL    Comment: Glucose reference range applies only to samples taken after fasting for at least 8 hours.  Miscellaneous [facility] test (send-out)     Status: None   Collection Time: 07/04/22  6:33 AM  Result Value Ref Range   [facility] test code [id number]    [facility] test name VD25    Source ([facility]) 1ML SERUM     Comment: Performed at [facility], [place] [address], [place], [place] [address]   Misc [facility] result COMMENT     Comment: (NOTE) Test Ordered: 629 100 6226 Vitamin D, 25-Hydroxy Vitamin D, 25-Hydroxy           14.1        [L ] ng/mL    BN     Reference Range: 30.0-100.0                            Vitamin D deficiency has been defined by the [place] practice guideline as a level of serum 25-OH vitamin D less than 20 ng/mL (1,2). The Endocrine Society went on to further define vitamin D insufficiency as a level between 21 and 29 ng/mL (2). 1. IOM (Institute of Medicine). 2010. Dietary reference   intakes for calcium and D. [place]: The   [employer]. 2. Holick MF, Binkley [place], Bischoff-Ferrari HA, et al.   Evaluation, treatment, and prevention of vitamin D   deficiency: an Endocrine Society clinical practice   guideline. JCEM. 2011 Jul; 96(7):1911-30. Performed At: [facility] [place], [place] [id number] Rush Farmer MD [id number]   Lipid panel     Status: Abnormal   Collection Time: 07/04/22  6:34 AM  Result Value Ref Range   Cholesterol 136 0 - 200 mg/dL  Triglycerides 247 (H) <150 mg/dL   HDL 19 (L) >40 mg/dL   Total CHOL/HDL Ratio 7.2 RATIO   VLDL 49 (H) 0 - 40 mg/dL   LDL Cholesterol 68 0 - 99 mg/dL    Comment:        Total Cholesterol/HDL:CHD Risk Coronary Heart Disease Risk Table                     Men   Women  1/2 Average Risk   3.4   3.3  Average Risk       5.0   4.4  2 X Average Risk   9.6   7.1  3 X Average Risk  23.4   11.0        Use the calculated Patient Ratio above and the CHD Risk Table to determine the patient's CHD Risk.        ATP III CLASSIFICATION (LDL):  <100     mg/dL   Optimal  100-129  mg/dL   Near or Above                    Optimal  130-159  mg/dL   Borderline  160-189  mg/dL   High  >190     mg/dL   Very High Performed at [facility] [address], [place], [place] [address]   Vitamin B12     Status: Abnormal   Collection Time: 07/04/22  6:34 AM  Result Value Ref Range   Vitamin B-12 1,142 (H) 180 - 914 pg/mL    Comment:  (NOTE) This assay is not validated for testing neonatal or myeloproliferative syndrome specimens for Vitamin B12 levels. Performed at [facility], [place] [address], [place], [place] [address]   Glucose, capillary     Status: Abnormal   Collection Time: 07/04/22 12:04 PM  Result Value Ref Range   Glucose-Capillary 219 (H) 70 - 99 mg/dL    Comment: Glucose reference range applies only to samples taken after fasting for at least 8 hours.  Glucose, capillary     Status: Abnormal   Collection Time: 07/04/22  4:11 PM  Result Value Ref Range   Glucose-Capillary 240 (H) 70 - 99 mg/dL    Comment: Glucose reference range applies only to samples taken after fasting for at least 8 hours.  Glucose, capillary     Status: Abnormal   Collection Time: 07/04/22  9:09 PM  Result Value Ref Range   Glucose-Capillary 243 (H) 70 - 99 mg/dL    Comment: Glucose reference range applies only to samples taken after fasting for at least 8 hours.   Comment 1 Notify RN    Comment 2 Document in Chart   Glucose, capillary     Status: Abnormal   Collection Time: 07/05/22  6:07 AM  Result Value Ref Range   Glucose-Capillary 226 (H) 70 - 99 mg/dL    Comment: Glucose reference range applies only to samples taken after fasting for at least 8 hours.  Glucose, capillary     Status: Abnormal   Collection Time: 07/05/22 11:49 AM  Result Value Ref Range   Glucose-Capillary 272 (H) 70 - 99 mg/dL    Comment: Glucose reference range applies only to samples taken after fasting for at least 8 hours.    Blood Alcohol level:  Lab Results  Component Value Date   [facility] <10 06/30/2022   ETH <10 0000000    Metabolic Disorder Labs: Lab Results  Component Value Date   HGBA1C 8.3 (H) 06/30/2022   MPG 191.51 06/30/2022   MPG 163 11/17/2021   Lab Results  Component Value Date   PROLACTIN 5.4 08/15/2020   PROLACTIN 52.5 (H) 02/10/2015   Lab Results  Component Value Date   CHOL 136 07/04/2022   TRIG  247 (H) 07/04/2022   HDL 19 (L) 07/04/2022   CHOLHDL 7.2 07/04/2022   VLDL 49 (H) 07/04/2022   LDLCALC 68 07/04/2022   LDLCALC 54 01/29/2022    Physical Findings: AIMS:  , ,  ,  ,    CIWA:    COWS:     Musculoskeletal: Strength & Muscle Tone: within normal limits Gait & Station: normal Patient leans: N/A  Psychiatric Specialty Exam:  Presentation  General Appearance:  Casual; Fairly Groomed  Eye Contact: Good  Speech: Normal Rate; Clear and Coherent  Speech Volume: Normal  Handedness: Right   Mood and Affect  Mood: Anxious; Dysphoric  Affect: Restricted   Thought Process  Thought Processes: Linear  Descriptions of Associations:Intact  Orientation:Full (Time, Place and Person)  Thought Content:Logical  History of Schizophrenia/Schizoaffective disorder:No data recorded Duration of Psychotic Symptoms:No data recorded Hallucinations:Hallucinations: None  Ideas of Reference:None  Suicidal Thoughts:Suicidal Thoughts: No  Homicidal Thoughts:Homicidal Thoughts: No   Sensorium  Memory: Immediate Good; Recent Good; Remote Good  Judgment: Fair  Insight: Fair   Community education officer  Concentration: Fair  Attention Span: Fair  Recall: Good  Fund of Knowledge: Good  Language: Good   Psychomotor Activity  Psychomotor Activity: Psychomotor Activity: Normal   Assets  Assets: Communication Skills   Sleep  Sleep: Sleep: Poor    Physical Exam: Physical Exam Vitals reviewed.  Constitutional:      General: He is not in acute distress.    Appearance: He is normal weight. He is not toxic-appearing.  Neurological:     Mental Status: He is alert.     Motor: No weakness.     Gait: Gait normal.    Review of Systems  Constitutional:  Negative for chills and fever.  Cardiovascular:  Negative for chest pain and palpitations.  Neurological:  Negative for dizziness, tingling, tremors and headaches.  Psychiatric/Behavioral:   Positive for depression. Negative for substance abuse and suicidal ideas. The patient is nervous/anxious and has insomnia.    Blood pressure (!) 150/90, pulse 89, temperature 97.6 F (36.4 C), temperature source Oral, resp. rate 20, height 6' 3"$  (1.905 m), weight 116.1 kg, SpO2 98 %. Body mass index is 32 kg/m.   Treatment Plan Summary: Daily contact with patient to assess and evaluate symptoms and progress in treatment and Medication management   Diagnoses:  Principal Problem:   Schizoaffective disorder, bipolar type ([place])   Generalized anxiety disorder   Obesity (BMI 30.0-34.9)   Uncontrolled type 2 diabetes mellitus with hyperglycemia ([place])   Essential hypertension   High cholesterol   Insomnia   Hypothyroid   Medications plan tx plan:  1.  Continue lithium taper, decrease to 300 mg once daily x2 doses, then stop.  2. taper off perphenazine to 4 mg bid mg bid today, then stop after 2 doses 3.  Increase abilify from 15 mg to 20 mg nightly for schizoaffective disorder 3. continue cymbalta 90 mg qd.  5. dc austedo.  6. dc xanax.  7.  Change clonazepam from 0.5 mg every morning to 0.25 mg twice daily 8. continue clonazepam 0.5 mg bid prn.  For breakthrough anxiety 9.  Incr gabapentin 400 mg from  tid to qid   10. hold straterra.  11.  Continue trazodone 100 mg nightly scheduled +50 mg nightly as needed 12. start zyprexa 10 mg bid prn oral and IM for agitation    Home medications for medical conditions -Continue Lipitor 80 mg daily for hyperlipidemia -Continue aspirin 81 mg daily -Continue Plavix 75 mg daily for cardiac stents -Continue Vascepa 2 g twice daily for cardiac protection -Continue insulin NovoLog 0 to 15 units as per sliding scale-please see MAR for detailed order -Continue insulin Semglee 15 units every morning for DM -Continue levothyroxine 25 mcg every morning for hypothyroidism -Continue fenofibrate tablets 160 mg daily for hypercholesteremia     Diabetes  medications above at the recommendations of the diabetic consultant.  Patient has been educated on all other medications listed above.  Education provided to the patient includes rationales, benefits, and possible side effects of all medications.  Patient verbalized understanding and is agreeable to trials, and medication adjustments for mood stabilization, and anxiety management.   Other PRNS -Continue Tylenol 650 mg every 6 hours PRN for mild pain -Continue Maalox 30 mg every 4 hrs PRN for indigestion -Continue Imodium 2-4 mg as needed for diarrhea -Continue Milk of Magnesia as needed every 6 hrs for constipation -Continue Zofran disintegrating tabs every 6 hrs PRN for nausea    Discharge Planning: Social work and case management to assist with discharge planning and identification of hospital follow-up needs prior to discharge Estimated LOS: 3-4 days Discharge Concerns: Need to establish a safety plan; Medication compliance and effectiveness Discharge Goals: Return home with outpatient referrals for mental health follow-up including medication management/psychotherapy  Christoper Allegra, MD 07/05/2022, 1:02 PM  Total Time Spent in Direct Patient Care:  I personally spent 35 minutes on the unit in direct patient care. The direct patient care time included face-to-face time with the patient, reviewing the patient's chart, communicating with other professionals, and coordinating care. Greater than 50% of this time was spent in counseling or coordinating care with the patient regarding goals of hospitalization, psycho-education, and discharge planning needs.   Janine Limbo, MD Psychiatrist

## 2022-07-05 NOTE — Inpatient Diabetes Management (Signed)
Inpatient Diabetes Program Recommendations  AACE/ADA: New Consensus Statement on Inpatient Glycemic Control   Target Ranges:  Prepandial:   less than 140 mg/dL      Peak postprandial:   less than 180 mg/dL (1-2 hours)      Critically ill patients:  140 - 180 mg/dL    Latest Reference Range & Units 07/05/22 06:07 07/05/22 11:49  Glucose-Capillary 70 - 99 mg/dL 226 (H) 272 (H)    Latest Reference Range & Units 07/04/22 06:00 07/04/22 12:04 07/04/22 16:11 07/04/22 21:09  Glucose-Capillary 70 - 99 mg/dL 213 (H) 219 (H) 240 (H) 243 (H)   Review of Glycemic Control  Diabetes history: DM2 Outpatient Diabetes medications: Lantus 25 units daily Current orders for Inpatient glycemic control: Semglee 15 units QAM, Novolog 4 units TID with meals, Novolog 0-15 units TID with meals  Inpatient Diabetes Program Recommendations:    Insulin: Please consider increasing Semglee to 18 units QAM and meal coverage to Novolog 7 units TID with meals.  Thanks, Barnie Alderman, RN, MSN, [place] Diabetes Coordinator Inpatient Diabetes Program [phone] (Team Pager from 8am to [place])

## 2022-07-06 DIAGNOSIS — F250 Schizoaffective disorder, bipolar type: Principal | ICD-10-CM

## 2022-07-06 LAB — GLUCOSE, CAPILLARY
Glucose-Capillary: 211 mg/dL — ABNORMAL HIGH (ref 70–99)
Glucose-Capillary: 184 mg/dL — ABNORMAL HIGH (ref 70–99)
Glucose-Capillary: 360 mg/dL — ABNORMAL HIGH (ref 70–99)

## 2022-07-06 MED ORDER — TRAZODONE HCL 150 MG PO TABS
150.0000 mg | ORAL_TABLET | Freq: Every day | ORAL | Status: DC
  Administered 2022-07-06: 150 mg via ORAL
  Filled 2022-07-06 (×3): qty 1

## 2022-07-06 NOTE — Group Note (Unsigned)
Date:  07/06/2022 Time:  11:38 AM  Group Topic/Focus:  Orientation:   The focus of this group is to educate the patient on the purpose and policies of crisis stabilization and provide a format to answer questions about their admission.  The group details unit policies and expectations of patients while admitted.     Participation Level:  {BHH PARTICIPATION HD:[id number]  Participation Quality:  {BHH PARTICIPATION QUALITY:22265}  Affect:  {BHH AFFECT:22266}  Cognitive:  {BHH COGNITIVE:22267}  Insight: {BHH Insight2:20797}  Engagement in Group:  {BHH ENGAGEMENT IN JY:[id number]  Modes of Intervention:  {BHH MODES OF INTERVENTION:22269}  Additional Comments:  ***  Matthew [name] 07/06/2022, 11:38 AM

## 2022-07-06 NOTE — Progress Notes (Signed)
Patient was noticed pacing up and down the hallway in his jacket, jeans, and tennis shoes on. This RN questioned what was going on, the patient responded "I am just not able to go to sleep and its making me anxious." Patient noted to be fidgeting with his hands. PRN medication to be administering.

## 2022-07-06 NOTE — Progress Notes (Signed)
   07/06/22 0800  Psych Admission Type (Psych Patients Only)  Admission Status Voluntary  Psychosocial Assessment  Patient Complaints Anxiety  Eye Contact Fair  Facial Expression Animated;Anxious  Affect Anxious;Depressed  Speech Logical/coherent  Interaction Intrusive  Motor Activity Restless;Fidgety  Appearance/Hygiene Unremarkable  Behavior Characteristics Cooperative;Restless  Mood Anxious;Pleasant  Aggressive Behavior  Effect No apparent injury  Thought Process  Coherency WDL  Content WDL  Delusions None reported or observed  Perception WDL  Hallucination None reported or observed  Judgment Limited  Confusion None  Danger to Self  Current suicidal ideation? Denies  Agreement Not to Harm Self Yes  Description of Agreement verbal  Danger to Others  Danger to Others None reported or observed

## 2022-07-06 NOTE — Progress Notes (Signed)
   07/06/22 0549  15 Minute Checks  Location Hallway  Visual Appearance Calm  Behavior Composed  Sleep (Behavioral Health Patients Only)  Calculate sleep? (Click Yes once per 24 hr at 0600 safety check) Yes  Documented sleep last 24 hours 6.25

## 2022-07-06 NOTE — Group Note (Signed)
Recreation Therapy Group Note   Group Topic:Problem Solving  Group Date: 07/06/2022 Start Time: 0930 End Time: 1000 Facilitators: Viaan Knippenberg-McCall, LRT,CTRS Location: [address] Dayroom   Goal Area(s) Addresses:  Patient will effectively work with peer towards shared goal.  Patient will identify skills used to make activity successful.  Patient will share challenges and verbalize solution-driven approaches used. Patient will identify how skills used during activity can be used to reach post d/c goals.    Group Description:  [employer]. Patients were provided the following materials: 4 drinking straws, 5 rubber bands, 5 paper clips, 2 index cards, 2 drinking cups, and 2 toilet paper rolls. Using the provided materials patients were asked to build a launching mechanism to launch a ping pong ball across the room, approximately 10 feet. Patients were divided into teams of 3-5. Instructions required all materials be incorporated into the device, functionality of items left to the peer group's discretion.   Affect/Mood: Appropriate   Participation Level: Engaged   Participation Quality: Independent   Behavior: Appropriate   Speech/Thought Process: Focused   Insight: Good   Judgement: Good   Modes of Intervention: Problem-solving   Patient Response to Interventions:  Engaged   Education Outcome:  Acknowledges education and In group clarification offered    Clinical Observations/Individualized Feedback: Pt attended and participated in group activity.     Plan: Continue to engage patient in RT group sessions 2-3x/week.   Noya Santarelli-McCall, LRT,CTRS 07/06/2022 12:58 PM

## 2022-07-06 NOTE — Progress Notes (Signed)
[facility] MD Progress Note  07/06/2022 3:30 PM CARNELIUS SEGRAVES  MRN:  [id number]  Subjective:    Matthew [name] is a 47 yo Caucasian male with a prior mental health history of schizoaffective d/o & GAD who was taken to the [facility] ER by EMS on 2/10 with an altered mental status, related to a Benzodiazepine overdose after his neighbors called emergency services because he was found to be lethargic and "wasn't acting normal". This is the second Benzo overdose in pt since this year, with the prior one being on 06/01/22.  Patient was medically treated and stabilized prior to being transferred voluntarily to this [facility] on 07/02/2022 for treatment and stabilization of his mood.    On my assessment today, patient reports that his mood is stable.  Denies any psychotic symptoms.  Reports anxiety is less.  Reports pacing less.  Reports feelings of breathlessness are less.  Reports that sleep continues to be up and down.  Reports he has OSA and does not use CPAP mask at home. He denies having any suicidal thoughts, which was explored carefully.  Denies any HI.  Denies any benzo withdrawal symptoms. He denies any side effects to multiple medication changes that were made including tapering off the lithium and cross tapering from Trilafon to Abilify. We discussed discharge planning, as early as tomorrow.  Patient is agreeable to this, future oriented.  He is able discussed stressors leading up to this admission, and changes to his psychiatric health, due to therapeutic milieu and medication changes during this hospitalization is being overall helpful in treating his underlying psychiatric illness, and reducing risk factors for suicide. He denies any recurrence of any tardive type movements since discontinuing the [place].  Per social work, social work may contact with the patient's father, and states that guns are locked and secured in the house.     Principal Problem: Schizoaffective disorder, bipolar type  ([place]) Diagnosis: Principal Problem:   Schizoaffective disorder, bipolar type ([place]) Active Problems:   Generalized anxiety disorder   Obesity (BMI 30.0-34.9)   Uncontrolled type 2 diabetes mellitus with hyperglycemia ([place])   Essential hypertension   High cholesterol   Insomnia   Hypothyroid  Total Time spent with patient: 15 minutes  Past Psychiatric History:  Previous Psych Diagnoses: Schizoaffective disorder, GAD Prior inpatient treatment: Reports June 01, 2022 when he overdosed on Xanax.  But as per chart review, patient was medically treated and discharged home without spending time in an inpatient behavioral health unit. Current/prior outpatient treatment: [facility] Prior rehab hx: Denies Psychotherapy hx: Denies History of suicide attempts: 06/01/2022-OD'd on Xanax History of homicide or aggression: Denies   Psychiatric medication history:Risperdal, Zyprexa, Abilify, [name], Klonopin, Gabapentin, Xanax, Valium, Ativan, Hydroxyzine, Trilafon, Atenolol, Cymbalta, Prozac, Lexapro, Paxil, Lithium, Adderall, Ritalin    Psychiatric medication compliance history: Reports compliance Neuromodulation history: Denies Current Psychiatrist: [place], [place] Current therapist: None   Past Medical History:  Past Medical History:  Diagnosis Date   Anxiety    Arthritis    Celiac disease    Coronary artery disease    Dairy allergy    Depression    Diabetes mellitus without complication ([place])    High cholesterol    Hypertension    Hypothyroidism    Myocardial infarction ([place])    Paranoia ([place])    Schizoaffective disorder ([place])     Past Surgical History:  Procedure Laterality Date   APPENDECTOMY     CARDIAC CATHETERIZATION  CORONARY STENT INTERVENTION N/A 06/06/2020   Procedure: CORONARY STENT INTERVENTION;  Surgeon: Martinique, Peter M, MD;  Location: [place] [facility];  Service: Cardiovascular;  Laterality: N/A;  prox LAD, distal  RCA   INTRAVASCULAR ULTRASOUND/IVUS N/A 06/06/2020   Procedure: Intravascular Ultrasound/IVUS;  Surgeon: Martinique, Peter M, MD;  Location: [place] [facility];  Service: Cardiovascular;  Laterality: N/A;   LEFT HEART CATH AND CORONARY ANGIOGRAPHY N/A 06/06/2020   Procedure: LEFT HEART CATH AND CORONARY ANGIOGRAPHY;  Surgeon: Martinique, Peter M, MD;  Location: [place] [facility];  Service: Cardiovascular;  Laterality: N/A;   LEFT HEART CATH AND CORONARY ANGIOGRAPHY N/A 02/24/2021   Procedure: LEFT HEART CATH AND CORONARY ANGIOGRAPHY;  Surgeon: Belva Crome, MD;  Location: [place] [facility];  Service: Cardiovascular;  Laterality: N/A;   NASAL SEPTUM SURGERY     TOTAL SHOULDER ARTHROPLASTY Right 11/28/2021   Procedure: TOTAL SHOULDER ARTHROPLASTY;  Surgeon: Marchia Bond, MD;  Location: [facility];  Service: Orthopedics;  Laterality: Right;   WRIST FRACTURE SURGERY     right wrist   Family History:  Family History  Problem Relation Age of Onset   Asthma Mother    Hypertension Mother    Hypertension Father    Family Psychiatric  History:  Medical: DM and stroke in mother Psych: Denies, but states that might be present but undiagnosed Psych Rx: Unsure SA/HA: Denies Substance use family hx: Denies   Social History:  Social History   Substance and Sexual Activity  Alcohol Use No   Alcohol/week: 0.0 standard drinks of alcohol   Comment: no (02/08/15)     Social History   Substance and Sexual Activity  Drug Use No    Social History   Socioeconomic History   Marital status: Single    Spouse name: Not on file   Number of children: 0   Years of education: 14   Highest education level: Associate degree: occupational, Hotel manager, or vocational program  Occupational History   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, multimedia, unemployed  Tobacco Use   Smoking status: Some Days    Packs/day: 1.00    Types: Cigarettes, E-cigarettes    Last attempt to quit: 10/19/2021    Years since quitting: 0.7   Smokeless tobacco:  Never  Vaping Use   Vaping Use: Former  Substance and Sexual Activity   Alcohol use: No    Alcohol/week: 0.0 standard drinks of alcohol    Comment: no (02/08/15)   Drug use: No   Sexual activity: Not Currently  Other Topics Concern   Not on file  Social History Narrative   Pt lives at the [place] Determinants of Health   Financial Resource Strain: Not on file  Food Insecurity: No Food Insecurity (07/02/2022)   Hunger Vital Sign    Worried About Running Out of Food in the Last Year: Never true    Cactus in the Last Year: Never true  Transportation Needs: No Transportation Needs (07/02/2022)   PRAPARE - Hydrologist (Medical): No    Lack of Transportation (Non-Medical): No  Physical Activity: Not on file  Stress: Not on file  Social Connections: Not on file   Additional Social History:                         Sleep: Poor  Appetite:  Fair  Current Medications: Current Facility-Administered Medications  Medication Dose Route Frequency Provider Last Rate  Last Admin   acetaminophen (TYLENOL) tablet 650 mg  650 mg Oral Q6H PRN Suella Broad, FNP   650 mg at 07/06/22 0941   alum & mag hydroxide-simeth (MAALOX/MYLANTA) 200-200-20 MG/5ML suspension 30 mL  30 mL Oral Q4H PRN Suella Broad, FNP       ARIPiprazole (ABILIFY) tablet 20 mg  20 mg Oral QHS Bralynn Velador, Ovid Curd, MD   20 mg at 07/05/22 2108   aspirin EC tablet 81 mg  81 mg Oral Daily Suella Broad, FNP   81 mg at 07/06/22 0801   atorvastatin (LIPITOR) tablet 80 mg  80 mg Oral q morning Suella Broad, FNP   80 mg at 07/06/22 0803   carvedilol (COREG) tablet 37.5 mg  37.5 mg Oral BID Suella Broad, FNP   37.5 mg at 07/06/22 0802   clonazePAM (KLONOPIN) disintegrating tablet 0.25 mg  0.25 mg Oral BID Takahiro Godinho, Ovid Curd, MD   0.25 mg at 07/06/22 1140   clonazePAM (KLONOPIN) tablet 0.5 mg  0.5 mg Oral BID PRN Suella Broad, FNP   0.5 mg at 07/05/22 1830   clopidogrel (PLAVIX) tablet 75 mg  75 mg Oral Daily Suella Broad, FNP   75 mg at 07/06/22 0801   DULoxetine (CYMBALTA) DR capsule 90 mg  90 mg Oral Daily Suella Broad, FNP   90 mg at 07/06/22 0802   fenofibrate tablet 160 mg  160 mg Oral Daily Suella Broad, FNP   160 mg at 07/06/22 0801   gabapentin (NEURONTIN) capsule 400 mg  400 mg Oral QID Janine Limbo, MD   400 mg at 07/06/22 1140   hydrOXYzine (ATARAX) tablet 25 mg  25 mg Oral Q6H PRN Nicholes Rough, NP   25 mg at 07/04/22 2116   icosapent Ethyl (VASCEPA) 1 g capsule 2 g  2 g Oral BID Suella Broad, FNP   2 g at 07/06/22 0802   insulin aspart (novoLOG) injection 0-15 Units  0-15 Units Subcutaneous TID WC Suella Broad, FNP   3 Units at 07/06/22 1307   insulin aspart (novoLOG) injection 7 Units  7 Units Subcutaneous TID WC Bergen Magner, Ovid Curd, MD   7 Units at 07/06/22 1309   insulin glargine-yfgn (SEMGLEE) injection 18 Units  18 Units Subcutaneous q morning Cleda Imel, Ovid Curd, MD   18 Units at 07/06/22 [id number]   levothyroxine (SYNTHROID) tablet 25 mcg  25 mcg Oral q AM Suella Broad, FNP   25 mcg at 07/06/22 0602   lithium carbonate (LITHOBID) ER tablet 300 mg  300 mg Oral Q supper Demari Kropp, Ovid Curd, MD   300 mg at 07/05/22 1800   magnesium hydroxide (MILK OF MAGNESIA) suspension 30 mL  30 mL Oral Daily PRN Starkes-Perry, Gayland Curry, FNP       nicotine (NICODERM CQ - dosed in mg/24 hours) patch 14 mg  14 mg Transdermal Daily Albertine Lafoy, Ovid Curd, MD   14 mg at 07/03/22 1530   nicotine polacrilex (NICORETTE) gum 2 mg  2 mg Oral PRN Suella Broad, FNP   2 mg at 07/06/22 1444   OLANZapine (ZYPREXA) injection 10 mg  10 mg Intramuscular BID PRN Naliyah Neth, Ovid Curd, MD       OLANZapine zydis (ZYPREXA) disintegrating tablet 10 mg  10 mg Oral BID PRN Ailed Defibaugh, Ovid Curd, MD   10 mg at 07/05/22 2208   ondansetron (ZOFRAN-ODT) disintegrating tablet 4 mg   4 mg Oral Q4H PRN Nicholes Rough, NP   4 mg at 07/03/22 1255  traZODone (DESYREL) tablet 150 mg  150 mg Oral QHS Ezequiel Macauley, MD       traZODone (DESYREL) tablet 50 mg  50 mg Oral QHS PRN Josias Tomerlin, Ovid Curd, MD   50 mg at 07/04/22 2238    Lab Results:  Results for orders placed or performed during the hospital encounter of 07/02/22 (from the past 48 hour(s))  Glucose, capillary     Status: Abnormal   Collection Time: 07/04/22  4:11 PM  Result Value Ref Range   Glucose-Capillary 240 (H) 70 - 99 mg/dL    Comment: Glucose reference range applies only to samples taken after fasting for at least 8 hours.  Glucose, capillary     Status: Abnormal   Collection Time: 07/04/22  9:09 PM  Result Value Ref Range   Glucose-Capillary 243 (H) 70 - 99 mg/dL    Comment: Glucose reference range applies only to samples taken after fasting for at least 8 hours.   Comment 1 Notify RN    Comment 2 Document in Chart   Glucose, capillary     Status: Abnormal   Collection Time: 07/05/22  6:07 AM  Result Value Ref Range   Glucose-Capillary 226 (H) 70 - 99 mg/dL    Comment: Glucose reference range applies only to samples taken after fasting for at least 8 hours.  Glucose, capillary     Status: Abnormal   Collection Time: 07/05/22 11:49 AM  Result Value Ref Range   Glucose-Capillary 272 (H) 70 - 99 mg/dL    Comment: Glucose reference range applies only to samples taken after fasting for at least 8 hours.  Glucose, capillary     Status: Abnormal   Collection Time: 07/05/22  5:14 PM  Result Value Ref Range   Glucose-Capillary 265 (H) 70 - 99 mg/dL    Comment: Glucose reference range applies only to samples taken after fasting for at least 8 hours.  Glucose, capillary     Status: Abnormal   Collection Time: 07/06/22  6:00 AM  Result Value Ref Range   Glucose-Capillary 211 (H) 70 - 99 mg/dL    Comment: Glucose reference range applies only to samples taken after fasting for at least 8 hours.   Comment 1  Document in Chart   Glucose, capillary     Status: Abnormal   Collection Time: 07/06/22 11:49 AM  Result Value Ref Range   Glucose-Capillary 184 (H) 70 - 99 mg/dL    Comment: Glucose reference range applies only to samples taken after fasting for at least 8 hours.    Blood Alcohol level:  Lab Results  Component Value Date   ETH <10 06/30/2022   ETH <10 0000000    Metabolic Disorder Labs: Lab Results  Component Value Date   HGBA1C 8.3 (H) 06/30/2022   MPG 191.51 06/30/2022   MPG 163 11/17/2021   Lab Results  Component Value Date   PROLACTIN 5.4 08/15/2020   PROLACTIN 52.5 (H) 02/10/2015   Lab Results  Component Value Date   CHOL 136 07/04/2022   TRIG 247 (H) 07/04/2022   HDL 19 (L) 07/04/2022   CHOLHDL 7.2 07/04/2022   VLDL 49 (H) 07/04/2022   LDLCALC 68 07/04/2022   LDLCALC 54 01/29/2022    Physical Findings: AIMS:  , ,  ,  ,    CIWA:    COWS:     Musculoskeletal: Strength & Muscle Tone: within normal limits Gait & Station: normal Patient leans: N/A  Psychiatric Specialty Exam:  Presentation  General Appearance:  Casual; Fairly Groomed  Eye Contact: Good  Speech: Normal Rate; Clear and Coherent  Speech Volume: Normal  Handedness: Right   Mood and Affect  Mood: Anxious  Affect: Restricted   Thought Process  Thought Processes: Linear  Descriptions of Associations:Intact  Orientation:Full (Time, Place and Person)  Thought Content:Logical  History of Schizophrenia/Schizoaffective disorder:No data recorded yes Duration of Psychotic Symptoms:No data recorded denies Hallucinations:Hallucinations: None  Ideas of Reference:None  Suicidal Thoughts:Suicidal Thoughts: No  Homicidal Thoughts:Homicidal Thoughts: No   Sensorium  Memory: Immediate Good; Recent Good; Remote Good  Judgment: Fair  Insight: Fair   Community education officer  Concentration: Fair  Attention Span: Fair  Recall: Good  Fund of  Knowledge: Good  Language: Good   Psychomotor Activity  Psychomotor Activity: Psychomotor Activity: Normal   Assets  Assets: Communication Skills   Sleep  Sleep: Sleep: Poor    Physical Exam: Physical Exam Vitals reviewed.  Constitutional:      General: He is not in acute distress.    Appearance: He is normal weight. He is not toxic-appearing.  Neurological:     Mental Status: He is alert.     Motor: No weakness.     Gait: Gait normal.    Review of Systems  Constitutional:  Negative for chills and fever.  Cardiovascular:  Negative for chest pain and palpitations.  Neurological:  Negative for dizziness, tingling, tremors and headaches.  Psychiatric/Behavioral:  Positive for depression. Negative for substance abuse and suicidal ideas. The patient is nervous/anxious and has insomnia.    Blood pressure 127/88, pulse 89, temperature 97.7 F (36.5 C), temperature source Oral, resp. rate 20, height 6' 3"$  (1.905 m), weight 116.1 kg, SpO2 97 %. Body mass index is 32 kg/m.   Treatment Plan Summary: Daily contact with patient to assess and evaluate symptoms and progress in treatment and Medication management   Diagnoses:  Principal Problem:   Schizoaffective disorder, bipolar type ([place])   Generalized anxiety disorder   Obesity (BMI 30.0-34.9)   Uncontrolled type 2 diabetes mellitus with hyperglycemia ([place])   Essential hypertension   High cholesterol   Insomnia   Hypothyroid   Medications plan tx plan:  1.  Continue lithium taper, decrease to 300 mg once daily (last dose today 2-16)  2. taper off perphenazine to 4 mg bid mg bid (last dose today 2-16)  3.  Continue abilify 20 mg nightly for schizoaffective disorder 3. continue cymbalta 90 mg qd.  5. dc on admission austedo.  6. dc on admission xanax.  7.  Continue clonazepam 0.25 mg twice daily 8. continue clonazepam 0.5 mg bid prn.  For breakthrough anxiety 9.  continue gabapentin 400 mg qid for neuropathy  and anxiety 10.  DC on admission straterra.  11.  Increase trazodone to 150 mg nightly scheduled +50 mg nightly as needed 12.  Continue zyprexa 10 mg bid prn oral and IM for agitation    Home medications for medical conditions -Continue Lipitor 80 mg daily for hyperlipidemia -Continue aspirin 81 mg daily -Continue Plavix 75 mg daily for cardiac stents -Continue Vascepa 2 g twice daily for cardiac protection -Insulin management, as ordered, and as recommended by the diabetes management coordinator -Continue levothyroxine 25 mcg every morning for hypothyroidism -Continue fenofibrate tablets 160 mg daily for hypercholesteremia     Diabetes medications above at the recommendations of the diabetic consultant.  Patient has been educated on all other medications listed above.  Education provided to the patient includes rationales, benefits, and possible side effects  of all medications.  Patient verbalized understanding and is agreeable to trials, and medication adjustments for mood stabilization, and anxiety management.   Other PRNS -Continue Tylenol 650 mg every 6 hours PRN for mild pain -Continue Maalox 30 mg every 4 hrs PRN for indigestion -Continue Imodium 2-4 mg as needed for diarrhea -Continue Milk of Magnesia as needed every 6 hrs for constipation -Continue Zofran disintegrating tabs every 6 hrs PRN for nausea    Discharge Planning: Social work and case management to assist with discharge planning and identification of hospital follow-up needs prior to discharge Estimated LOS: dc plan tomorrow 2-17  Discharge Concerns: Need to establish a safety plan; Medication compliance and effectiveness Discharge Goals: Return home with outpatient referrals for mental health follow-up including medication management/psychotherapy  Christoper Allegra, MD 07/06/2022, 3:30 PM  Total Time Spent in Direct Patient Care:  I personally spent 35 minutes on the unit in direct patient care. The direct  patient care time included face-to-face time with the patient, reviewing the patient's chart, communicating with other professionals, and coordinating care. Greater than 50% of this time was spent in counseling or coordinating care with the patient regarding goals of hospitalization, psycho-education, and discharge planning needs.   Janine Limbo, MD Psychiatrist

## 2022-07-07 LAB — GLUCOSE, CAPILLARY
Glucose-Capillary: 210 mg/dL — ABNORMAL HIGH (ref 70–99)
Glucose-Capillary: 198 mg/dL — ABNORMAL HIGH (ref 70–99)

## 2022-07-07 MED ORDER — ARIPIPRAZOLE 20 MG PO TABS: 20.0000 mg | ORAL_TABLET | Freq: Every day | ORAL | 0 refills | Status: DC

## 2022-07-07 MED ORDER — GABAPENTIN 400 MG PO CAPS: 400.0000 mg | ORAL_CAPSULE | Freq: Four times a day (QID) | ORAL | 0 refills | Status: DC

## 2022-07-07 MED ORDER — CLOPIDOGREL BISULFATE 75 MG PO TABS: 75.0000 mg | ORAL_TABLET | Freq: Every day | ORAL | 0 refills | Status: DC

## 2022-07-07 MED ORDER — DULOXETINE HCL 30 MG PO CPEP: 90.0000 mg | ORAL_CAPSULE | Freq: Every day | ORAL | 0 refills | Status: DC

## 2022-07-07 MED ORDER — CLONAZEPAM 0.25 MG PO TBDP: 0.2500 mg | ORAL_TABLET | Freq: Two times a day (BID) | ORAL | 0 refills | Status: DC

## 2022-07-07 MED ORDER — CARVEDILOL 12.5 MG PO TABS: 37.5000 mg | ORAL_TABLET | Freq: Two times a day (BID) | ORAL | 0 refills | Status: DC

## 2022-07-07 MED ORDER — INSULIN GLARGINE-YFGN 100 UNIT/ML ~~LOC~~ SOLN: 18.0000 [IU] | Freq: Every morning | SUBCUTANEOUS | 0 refills | Status: DC

## 2022-07-07 MED ORDER — NICOTINE POLACRILEX 2 MG MT GUM: 2.0000 mg | CHEWING_GUM | OROMUCOSAL | 0 refills | Status: DC | PRN

## 2022-07-07 MED ORDER — FENOFIBRATE 160 MG PO TABS: 160.0000 mg | ORAL_TABLET | Freq: Every day | ORAL | 0 refills | Status: DC

## 2022-07-07 MED ORDER — CLONAZEPAM 0.5 MG PO TABS: 0.5000 mg | ORAL_TABLET | Freq: Two times a day (BID) | ORAL | 0 refills | Status: DC | PRN

## 2022-07-07 MED ORDER — TRAZODONE HCL 150 MG PO TABS: 150.0000 mg | ORAL_TABLET | Freq: Every day | ORAL | 0 refills | Status: DC

## 2022-07-07 MED ORDER — HYDROXYZINE HCL 25 MG PO TABS: 25.0000 mg | ORAL_TABLET | Freq: Four times a day (QID) | ORAL | 0 refills | Status: DC | PRN

## 2022-07-07 MED ORDER — ASPIRIN 81 MG PO TBEC: 81.0000 mg | DELAYED_RELEASE_TABLET | Freq: Every day | ORAL | 0 refills | Status: AC

## 2022-07-07 MED ORDER — LEVOTHYROXINE SODIUM 25 MCG PO TABS: 25.0000 ug | ORAL_TABLET | Freq: Every morning | ORAL | 0 refills | Status: DC

## 2022-07-07 NOTE — Progress Notes (Signed)
   07/06/22 2200  Psych Admission Type (Psych Patients Only)  Admission Status Voluntary  Psychosocial Assessment  Patient Complaints Anxiety  Eye Contact Fair  Facial Expression Animated  Affect Appropriate to circumstance  Speech Logical/coherent  Interaction Assertive  Motor Activity Fidgety  Appearance/Hygiene Unremarkable  Behavior Characteristics Cooperative  Mood Pleasant  Thought Process  Coherency WDL  Content WDL  Delusions None reported or observed  Perception WDL  Hallucination None reported or observed  Judgment Poor  Confusion None  Danger to Self  Current suicidal ideation? Denies  Agreement Not to Harm Self Yes  Description of Agreement verbal  Danger to Others  Danger to Others None reported or observed

## 2022-07-07 NOTE — Group Note (Signed)
LCSW Group Therapy Note  Group Date: 07/07/2022 Start Time: [id number] End Time: 1115   Type of Therapy and Topic:  Group Therapy: Anger Cues and Responses  Participation Level:  Active   Description of Group:   In this group, patients learned how to recognize the physical, cognitive, emotional, and behavioral responses they have to anger-provoking situations.  They identified a recent time they became angry and how they reacted.  They analyzed how their reaction was possibly beneficial and how it was possibly unhelpful.  The group discussed a variety of healthier coping skills that could help with such a situation in the future.  Focus was placed on how helpful it is to recognize the underlying emotions to our anger, because working on those can lead to a more permanent solution as well as our ability to focus on the important rather than the urgent.  Therapeutic Goals: Patients will remember their last incident of anger and how they felt emotionally and physically, what their thoughts were at the time, and how they behaved. Patients will identify how their behavior at that time worked for them, as well as how it worked against them. Patients will explore possible new behaviors to use in future anger situations. Patients will learn that anger itself is normal and cannot be eliminated, and that healthier reactions can assist with resolving conflict rather than worsening situations.  Summary of Patient Progress:  Matthew [name] was active during the group. He shared a frequent occurrence of his Matthew [name] sister-in-law talkings leads to anger.  He tries to suck it up and tolerate her because he does not want to anger his brother.   He demonstrated good insight into the subject matter, was respectful of peers, and participated throughout the entire session.  Therapeutic Modalities:   Cognitive Behavioral Therapy    Marquette Old 07/07/2022  4:13 PM

## 2022-07-07 NOTE — Progress Notes (Signed)
Patient is discharging at this time. Patient is A&Ox4. Vs stable. Patient denies SI,HI, and A/V/H with no plan/intent. Printed AVS reviewed with and given to patient along with medications and follow up appointments. Patient verbalized all understanding. All valuables/belongings returned to patient. Patient is being transported by his father. Patient denies any pain/discomfort. No s/s of current distress.

## 2022-07-07 NOTE — Progress Notes (Signed)
  [facility] Adult Case Management Discharge Plan :  Will you be returning to the same living situation after discharge:  Yes,  lives alone At discharge, do you have transportation home?: Yes,  arranged by patient Do you have the ability to pay for your medications: Yes,  insurance  Release of information consent forms completed and emailed to Medical Records, then turned in to Medical Records by CSW.   Patient to Follow up at:  Follow-up Information     [facility]. Go on 07/13/2022.   Specialty: Behavioral Health Why: You have an appointment on 07/13/22 at 10:30 am for medication management services.  At that time, you can also schedule an appointment for therapy services. Contact information: [address] [place] [phone]                Next level of care provider has access to [place] and Suicide Prevention discussed: Yes,  with father     Has patient been referred to the Quitline?: Patient refused referral  Patient has been referred for addiction treatment: Yes  Maretta Los, LCSW 07/07/2022, 10:00 AM

## 2022-07-07 NOTE — Progress Notes (Signed)
Pt has been pacing in the room most of the night. Pt stated he can't sleep despite having taken 150 mg of trazodone. Pt stated he takes 300 mg at home. Pt's room mate could not rest well because of being woken up by the pt. Will continue to monitor.

## 2022-07-07 NOTE — Discharge Summary (Signed)
Physician Discharge Summary Note  Patient:  Matthew [name] is an 47 y.o., male MRN:  [id number] DOB:  02/09/1976 Patient phone:  [phone] (home)  Patient address:   [address] [place] [address],  Total Time spent with patient: 45 minutes  Date of Admission:  07/02/2022 Date of Discharge: 07/07/2022  Reason for Admission:   Matthew [name] is a 47 yo Caucasian male with a prior mental health history of schizoaffective d/o & GAD who was taken to the [facility] ER by EMS on 2/10 with an altered mental status, related to a Benzodiazepine overdose after his neighbors called emergency services because he was found to be lethargic and "wasn't acting normal". This is the second Benzo overdose in pt since this year, with the prior one being on 06/01/22.  Patient was medically treated and stabilized prior to being transferred voluntarily to this [facility] on 07/02/2022 for treatment and stabilization of his mood.      Principal Problem: Schizoaffective disorder, bipolar type [facility]) Discharge Diagnoses: Principal Problem:   Schizoaffective disorder, bipolar type ([place]) Active Problems:   Generalized anxiety disorder   Obesity (BMI 30.0-34.9)   Uncontrolled type 2 diabetes mellitus with hyperglycemia (HCC)   Essential hypertension   High cholesterol   Insomnia   Hypothyroid  Past Psychiatric History: See H & P  Past Medical History:  Past Medical History:  Diagnosis Date   Anxiety    Arthritis    Celiac disease    Coronary artery disease    Dairy allergy    Depression    Diabetes mellitus without complication ([place])    High cholesterol    Hypertension    Hypothyroidism    Myocardial infarction ([place])    Paranoia ([place])    Schizoaffective disorder ([place])     Past Surgical History:  Procedure Laterality Date   APPENDECTOMY     CARDIAC CATHETERIZATION     CORONARY STENT INTERVENTION N/A 06/06/2020   Procedure: CORONARY STENT INTERVENTION;  Surgeon: Martinique, Peter M, MD;   Location: [place] [facility];  Service: Cardiovascular;  Laterality: N/A;  prox LAD, distal RCA   INTRAVASCULAR ULTRASOUND/IVUS N/A 06/06/2020   Procedure: Intravascular Ultrasound/IVUS;  Surgeon: Martinique, Peter M, MD;  Location: [place] [facility];  Service: Cardiovascular;  Laterality: N/A;   LEFT HEART CATH AND CORONARY ANGIOGRAPHY N/A 06/06/2020   Procedure: LEFT HEART CATH AND CORONARY ANGIOGRAPHY;  Surgeon: Martinique, Peter M, MD;  Location: [place] [facility];  Service: Cardiovascular;  Laterality: N/A;   LEFT HEART CATH AND CORONARY ANGIOGRAPHY N/A 02/24/2021   Procedure: LEFT HEART CATH AND CORONARY ANGIOGRAPHY;  Surgeon: Belva Crome, MD;  Location: [place] [facility];  Service: Cardiovascular;  Laterality: N/A;   NASAL SEPTUM SURGERY     TOTAL SHOULDER ARTHROPLASTY Right 11/28/2021   Procedure: TOTAL SHOULDER ARTHROPLASTY;  Surgeon: Marchia Bond, MD;  Location: [facility];  Service: Orthopedics;  Laterality: Right;   WRIST FRACTURE SURGERY     right wrist   Family History:  Family History  Problem Relation Age of Onset   Asthma Mother    Hypertension Mother    Hypertension Father    Family Psychiatric  History: See H & P Social History:  Social History   Substance and Sexual Activity  Alcohol Use No   Alcohol/week: 0.0 standard drinks of alcohol   Comment: no (02/08/15)     Social History   Substance and Sexual Activity  Drug Use No    Social  History   Socioeconomic History   Marital status: Single    Spouse name: Not on file   Number of children: 0   Years of education: 14   Highest education level: Associate degree: occupational, Hotel manager, or vocational program  Occupational History   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, multimedia, unemployed  Tobacco Use   Smoking status: Some Days    Packs/day: 1.00    Types: Cigarettes, E-cigarettes    Last attempt to quit: 10/19/2021    Years since quitting: 0.7   Smokeless tobacco: Never  Vaping Use   Vaping Use: Former  Substance and Sexual  Activity   Alcohol use: No    Alcohol/week: 0.0 standard drinks of alcohol    Comment: no (02/08/15)   Drug use: No   Sexual activity: Not Currently  Other Topics Concern   Not on file  Social History Narrative   Pt lives at the [facility] Determinants of Health   Financial Resource Strain: Not on file  Food Insecurity: No Food Insecurity (07/02/2022)   Hunger Vital Sign    Worried About Running Out of Food in the Last Year: Never true    Ran Out of Food in the Last Year: Never true  Transportation Needs: No Transportation Needs (07/02/2022)   PRAPARE - Hydrologist (Medical): No    Lack of Transportation (Non-Medical): No  Physical Activity: Not on file  Stress: Not on file  Social Connections: Not on file                                     HOSPITAL COURSE During the patient's hospitalization, patient had extensive initial psychiatric evaluation, and follow-up psychiatric evaluations every day. Psychiatric diagnoses provided upon initial assessment are as listed above.  Patient's psychiatric medications were adjusted on admission as below: 1. taper lithium from CR 900 mg with dinner to 600 mg x2 doses, then decr to 300 mg x2 doses, then stop.  2. taper off perphenazine to 16 mg bid today, then taper further.  3. start abilify 10 mg qhs and incr as crosstaper from perphenazine.  3. continue cymbalta 90 mg qd.  5. dc austedo.  6. dc xanax.  7. start clonazepam 0.5 mg qd.  8. continue clonazepam 0.5 mg bid prn.  9. restart gabapentin 400 mg q8H.  10. hold straterra.  11. start trazodone 50 mg qhs scheduled plus 50 mg qhs prn. 12. start zyprexa 10 mg bid prn oral and IM for agitation    During the hospitalization, other adjustments were made to the patient's psychiatric medication regimen. Medications at discharge are as follows: - Continue lithium taper, decrease to 300 mg once daily (last dose today 2-16)  -Continue abilify 20 mg nightly  for schizoaffective disorder -Continue cymbalta 90 mg qd. -Continue clonazepam 0.25 mg twice daily -Continue clonazepam 0.5 mg bid prn.  For breakthrough anxiety -Continue gabapentin 400 mg qid for neuropathy and anxiety -Continue trazodone to 150 mg nightly for sleep    Home medications for medical conditions -Continue Lipitor 80 mg daily for hyperlipidemia -Continue aspirin 81 mg daily -Continue Plavix 75 mg daily for cardiac stents -Continue Vascepa 2 g twice daily for cardiac protection -Continue levothyroxine 25 mcg every morning for hypothyroidism -Continue fenofibrate tablets 160 mg daily for hypercholesteremia    Diabetes medications above at the recommendations of the diabetic consultant.  Patient has been educated  on all other medications listed above.  Education provided to the patient includes rationales, benefits, and possible side effects of all medications.  Patient verbalized understanding and is agreeable to trials, and medication adjustments for mood stabilization, and anxiety    Patient's care was discussed during the interdisciplinary team meeting every day during the hospitalization.The patient denies  having side effects to prescribed psychiatric medication. Gradually, patient started adjusting to milieu. The patient was evaluated each day by a clinical provider to ascertain response to treatment. Improvement was noted by the patient's report of decreasing symptoms, improved sleep and appetite, affect, medication tolerance, behavior, and participation in unit programming.  Patient was asked each day to complete a self inventory noting mood, mental status, pain, new symptoms, anxiety and concerns.     Symptoms were reported as significantly decreased or resolved completely by discharge. On day of discharge, the patient reports that their mood is stable. The patient denied having suicidal thoughts for more than 48 hours prior to discharge.  Patient denies having homicidal  thoughts.  Patient denies having auditory hallucinations.  Patient denies any visual hallucinations or other symptoms of psychosis. The patient was motivated to continue taking medication with a goal of continued improvement in mental health.    The patient reports their target psychiatric symptoms of depression, anxiety, insomnia responded well to the psychiatric medications, and the patient reports overall benefit from this psychiatric hospitalization. Supportive psychotherapy was provided to the patient. The patient also participated in regular group therapy while hospitalized. Coping skills, problem solving as well as relaxation therapies were also part of the unit programming.   Labs were reviewed with the patient, and abnormal results were discussed with the patient.   The patient is able to verbalize their individual safety plan to this provider.   # It is recommended to the patient to continue psychiatric medications as prescribed, after discharge from the hospital.     # It is recommended to the patient to follow up with your outpatient psychiatric provider and PCP.   # It was discussed with the patient, the impact of alcohol, drugs, tobacco have been there overall psychiatric and medical wellbeing, and total abstinence from substance use was recommended the patient.ed.   # Prescriptions provided or sent directly to preferred pharmacy at discharge. Patient agreeable to plan. Given opportunity to ask questions. Appears to feel comfortable with discharge.    # In the event of worsening symptoms, the patient is instructed to call the crisis hotline, 911 and or go to the nearest ED for appropriate evaluation and treatment of symptoms. To follow-up with primary care provider for other medical issues, concerns and or health care needs   # Patient was discharged home with a plan to follow up as noted below.    Physical Findings: AIMS: 0 CIWA: n/a  COWS: n/a  Musculoskeletal: Strength &  Muscle Tone: within normal limits Gait & Station: normal Patient leans: N/A   Psychiatric Specialty Exam:  Presentation  General Appearance:  Appropriate for Environment; Fairly Groomed  Eye Contact: Fair  Speech: Clear and Coherent  Speech Volume: Normal  Handedness: Right   Mood and Affect  Mood: Euthymic  Affect: Congruent; Appropriate   Thought Process  Thought Processes: Coherent  Descriptions of Associations:Intact  Orientation:Full (Time, Place and Person)  Thought Content:Logical  History of Schizophrenia/Schizoaffective disorder:No data recorded Duration of Psychotic Symptoms:No data recorded Hallucinations:Hallucinations: None  Ideas of Reference:None  Suicidal Thoughts:Suicidal Thoughts: No  Homicidal Thoughts:Homicidal Thoughts: No  Sensorium  Memory: Immediate Good  Judgment: Good  Insight: Good   Executive Functions  Concentration: Good  Attention Span: Good  Recall: Good  Fund of Knowledge: Good  Language: Good   Psychomotor Activity  Psychomotor Activity: Psychomotor Activity: Normal   Assets  Assets: Communication Skills   Sleep  Sleep: Sleep: Good    Physical Exam: Physical Exam Constitutional:      Appearance: Normal appearance.  HENT:     Head: Normocephalic.  Eyes:     Pupils: Pupils are equal, round, and reactive to light.  Musculoskeletal:        General: Normal range of motion.  Neurological:     Mental Status: He is oriented to person, place, and time.  Psychiatric:        Behavior: Behavior normal.        Thought Content: Thought content normal.    Review of Systems  Constitutional: Negative.   HENT: Negative.    Eyes: Negative.   Respiratory: Negative.    Cardiovascular: Negative.   Gastrointestinal: Negative.   Genitourinary: Negative.   Musculoskeletal: Negative.   Skin: Negative.   Neurological: Negative.   Psychiatric/Behavioral:  Positive for depression (Denies  SI/HI/AVH, contracts for safety outside of [facility]) and substance abuse (educated on need to take meds as prescribed). Negative for hallucinations, memory loss and suicidal ideas. The patient is nervous/anxious (resolving) and has insomnia (resolving).    Blood pressure (!) 127/94, pulse 89, temperature 97.8 F (36.6 C), temperature source Oral, resp. rate 16, height 6' 3"$  (1.905 m), weight 116.1 kg, SpO2 98 %. Body mass index is 32 kg/m.   Social History   Tobacco Use  Smoking Status Some Days   Packs/day: 1.00   Types: Cigarettes, E-cigarettes   Last attempt to quit: 10/19/2021   Years since quitting: 0.7  Smokeless Tobacco Never   Tobacco Cessation:  A prescription for an FDA-approved tobacco cessation medication provided at discharge   Blood Alcohol level:  Lab Results  Component Value Date   [facility] <10 06/30/2022   ETH <10 0000000    Metabolic Disorder Labs:  Lab Results  Component Value Date   HGBA1C 8.3 (H) 06/30/2022   MPG 191.51 06/30/2022   MPG 163 11/17/2021   Lab Results  Component Value Date   PROLACTIN 5.4 08/15/2020   PROLACTIN 52.5 (H) 02/10/2015   Lab Results  Component Value Date   CHOL 136 07/04/2022   TRIG 247 (H) 07/04/2022   HDL 19 (L) 07/04/2022   CHOLHDL 7.2 07/04/2022   VLDL 49 (H) 07/04/2022   LDLCALC 68 07/04/2022   LDLCALC 54 01/29/2022    See Psychiatric Specialty Exam and Suicide Risk Assessment completed by Attending Physician prior to discharge.  Discharge destination:  Home  Is patient on multiple antipsychotic therapies at discharge:  No   Has Patient had three or more failed trials of antipsychotic monotherapy by history:  No  Recommended Plan for Multiple Antipsychotic Therapies: NA   Allergies as of 07/07/2022       Reactions   Gluten Meal Other (See Comments)   Celiac disease   Lactose Intolerance (gi) Diarrhea   Can tolerate hard cheese        Medication List     STOP taking these medications    atorvastatin  80 MG tablet Commonly known as: LIPITOR   Dexcom G7 Sensor Misc   icosapent Ethyl 1 g capsule Commonly known as: VASCEPA   Lantus SoloStar 100 UNIT/ML Solostar Pen Generic drug:  insulin glargine   lithium carbonate 300 MG ER tablet Commonly known as: LITHOBID   nicotine 14 mg/24hr patch Commonly known as: NICODERM CQ - dosed in mg/24 hours   perphenazine 16 MG tablet Commonly known as: TRILAFON       TAKE these medications      Indication  ARIPiprazole 20 MG tablet Commonly known as: ABILIFY Take 1 tablet (20 mg total) by mouth at bedtime.  Indication: mood stabilization   aspirin EC 81 MG tablet Take 1 tablet (81 mg total) by mouth daily. Swallow whole. 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 What changed: medication strength  Indication: High Blood Pressure Disorder   clonazePAM 0.25 MG disintegrating tablet Commonly known as: KLONOPIN Take 1 tablet (0.25 mg total) by mouth 2 (two) times daily.  Indication: Feeling Anxious   clonazePAM 0.5 MG tablet Commonly known as: KLONOPIN Take 1 tablet (0.5 mg total) by mouth 2 (two) times daily as needed (for anxiety, agitation and benzodiazepine withdrawal).  Indication: Feeling Anxious   clopidogrel 75 MG tablet Commonly known as: PLAVIX Take 1 tablet (75 mg total) by mouth daily. Start taking on: July 08, 2022  Indication: Carotid Artery Stenting   DULoxetine 30 MG capsule Commonly known as: CYMBALTA Take 3 capsules (90 mg total) by mouth daily. Start taking on: July 08, 2022  Indication: Major Depressive Disorder   fenofibrate 160 MG tablet Take 1 tablet (160 mg total) by mouth daily. Start taking on: July 08, 2022 What changed:  medication strength how much to take  Indication: High Amount of Triglycerides in the  Blood   gabapentin 400 MG capsule Commonly known as: NEURONTIN Take 1 capsule (400 mg total) by mouth 4 (four) times daily. What changed:  how much to take how to take this when to take this additional instructions  Indication: anxiety   hydrOXYzine 25 MG tablet Commonly known as: ATARAX Take 1 tablet (25 mg total) by mouth every 6 (six) hours as needed for anxiety.  Indication: Feeling Anxious   insulin glargine-yfgn 100 UNIT/ML injection Commonly known as: SEMGLEE Inject 0.18 mLs (18 Units total) into the skin every morning. Start taking on: July 08, 2022  Indication: Type 2 Diabetes   levothyroxine 25 MCG tablet Commonly known as: SYNTHROID Take 1 tablet (25 mcg total) by mouth in the morning. Start taking on: July 08, 2022 What changed:  how much to take how to take this when to take this additional instructions  Indication: Underactive Thyroid   nicotine polacrilex 2 MG gum Commonly known as: NICORETTE Take 1 each (2 mg total) by mouth as needed for smoking cessation.  Indication: Nicotine Addiction   traZODone 150 MG tablet Commonly known as: DESYREL Take 1 tablet (150 mg total) by mouth at bedtime.  Indication: [facility]. Go on 07/13/2022.   Specialty: Behavioral Health Why: You have an appointment on 07/13/22 at 10:30 am for medication management services.  At that time, you can also schedule an appointment for therapy services. Contact information: [address], [place] [phone]                Signed: Nicholes Rough, NP 07/07/2022, 3:27 PM

## 2022-07-07 NOTE — Group Note (Signed)
Date:  07/07/2022 Time:  11:32 AM  Group Topic/Focus:  Self Care:   The focus of this group is to help patients understand the importance of self-care in order to improve or restore emotional, physical, spiritual, interpersonal, and financial health.    Participation Level:  Minimal  Participation Quality:  Sharing  Affect:  Anxious  Cognitive:  Appropriate  Insight: Good  Engagement in Group:  Limited  Modes of Intervention:  Limit-setting  Additional Comments:     Jerrye Beavers 07/07/2022, 11:32 AM

## 2022-07-07 NOTE — [facility] Group Notes (Signed)
Goals Group 07/07/22   Group Focus: affirmation, clarity of thought, and goals/reality orientation Treatment Modality:  Psychoeducation Interventions utilized were assignment, group exercise, and support Purpose: To be able to understand and verbalize the reason for their admission to the hospital. To understand that the medication helps with their chemical imbalance but they also need to work on their choices in life. To be challenged to develop a list of 30 positives about themselves. Also introduce the concept that "feelings" are not reality.  Participation Level:  did not attend [facility]

## 2022-07-07 NOTE — [facility] Suicide Risk Assessment (Signed)
Suicide Risk Assessment  Discharge Assessment    [facility] Discharge Suicide Risk Assessment   Principal Problem: Schizoaffective disorder, bipolar type [facility]) Discharge Diagnoses: Principal Problem:   Schizoaffective disorder, bipolar type ([place]) Active Problems:   Generalized anxiety disorder   Obesity (BMI 30.0-34.9)   Uncontrolled type 2 diabetes mellitus with hyperglycemia ([place])   Essential hypertension   High cholesterol   Insomnia   Hypothyroid  Reason for Admission: Matthew [name] is a 47 yo Caucasian male with a prior mental health history of schizoaffective d/o & GAD who was taken to the [facility] ER by EMS on 2/10 with an altered mental status, related to a Benzodiazepine overdose after his neighbors called emergency services because he was found to be lethargic and "wasn't acting normal". This is the second Benzo overdose in pt since this year, with the prior one being on 06/01/22.  Patient was medically treated and stabilized prior to being transferred voluntarily to this [facility] on 07/02/2022 for treatment and stabilization of his mood.                                HOSPITAL COURSE During the patient's hospitalization, patient had extensive initial psychiatric evaluation, and follow-up psychiatric evaluations every day. Psychiatric diagnoses provided upon initial assessment are as listed above.  Patient's psychiatric medications were adjusted on admission as below: 1. taper lithium from CR 900 mg with dinner to 600 mg x2 doses, then decr to 300 mg x2 doses, then stop.  2. taper off perphenazine to 16 mg bid today, then taper further.  3. start abilify 10 mg qhs and incr as crosstaper from perphenazine.  3. continue cymbalta 90 mg qd.  5. dc austedo.  6. dc xanax.  7. start clonazepam 0.5 mg qd.  8. continue clonazepam 0.5 mg bid prn.  9. restart gabapentin 400 mg q8H.  10. hold straterra.  11. start trazodone 50 mg qhs scheduled plus 50 mg qhs prn. 12. start zyprexa 10 mg bid prn  oral and IM for agitation   During the hospitalization, other adjustments were made to the patient's psychiatric medication regimen. Medications at discharge are as follows: - Continue lithium taper, decrease to 300 mg once daily (last dose today 2-16)  -Continue abilify 20 mg nightly for schizoaffective disorder -Continue cymbalta 90 mg qd. -Continue clonazepam 0.25 mg twice daily -Continue clonazepam 0.5 mg bid prn.  For breakthrough anxiety -Continue gabapentin 400 mg qid for neuropathy and anxiety -Continue trazodone to 150 mg nightly for sleep    Home medications for medical conditions -Continue Lipitor 80 mg daily for hyperlipidemia -Continue aspirin 81 mg daily -Continue Plavix 75 mg daily for cardiac stents -Continue Vascepa 2 g twice daily for cardiac protection -Continue levothyroxine 25 mcg every morning for hypothyroidism -Continue fenofibrate tablets 160 mg daily for hypercholesteremia    Diabetes medications above at the recommendations of the diabetic consultant.  Patient has been educated on all other medications listed above.  Education provided to the patient includes rationales, benefits, and possible side effects of all medications.  Patient verbalized understanding and is agreeable to trials, and medication adjustments for mood stabilization, and anxiety   Patient's care was discussed during the interdisciplinary team meeting every day during the hospitalization.The patient denies  having side effects to prescribed psychiatric medication. Gradually, patient started adjusting to milieu. The patient was evaluated each day by a clinical provider to ascertain response to treatment. Improvement  was noted by the patient's report of decreasing symptoms, improved sleep and appetite, affect, medication tolerance, behavior, and participation in unit programming.  Patient was asked each day to complete a self inventory noting mood, mental status, pain, new symptoms, anxiety and  concerns.    Symptoms were reported as significantly decreased or resolved completely by discharge. On day of discharge, the patient reports that their mood is stable. The patient denied having suicidal thoughts for more than 48 hours prior to discharge.  Patient denies having homicidal thoughts.  Patient denies having auditory hallucinations.  Patient denies any visual hallucinations or other symptoms of psychosis. The patient was motivated to continue taking medication with a goal of continued improvement in mental health.   The patient reports their target psychiatric symptoms of depression, anxiety, insomnia responded well to the psychiatric medications, and the patient reports overall benefit from this psychiatric hospitalization. Supportive psychotherapy was provided to the patient. The patient also participated in regular group therapy while hospitalized. Coping skills, problem solving as well as relaxation therapies were also part of the unit programming.  Labs were reviewed with the patient, and abnormal results were discussed with the patient.  The patient is able to verbalize their individual safety plan to this provider.  # It is recommended to the patient to continue psychiatric medications as prescribed, after discharge from the hospital.    # It is recommended to the patient to follow up with your outpatient psychiatric provider and PCP.  # It was discussed with the patient, the impact of alcohol, drugs, tobacco have been there overall psychiatric and medical wellbeing, and total abstinence from substance use was recommended the patient.ed.  # Prescriptions provided or sent directly to preferred pharmacy at discharge. Patient agreeable to plan. Given opportunity to ask questions. Appears to feel comfortable with discharge.    # In the event of worsening symptoms, the patient is instructed to call the crisis hotline, 911 and or go to the nearest ED for appropriate evaluation and  treatment of symptoms. To follow-up with primary care provider for other medical issues, concerns and or health care needs  # Patient was discharged home with a plan to follow up as noted below.   Total Time spent with patient: 30 minutes  Musculoskeletal: Strength & Muscle Tone: within normal limits Gait & Station: normal Patient leans: N/A  Psychiatric Specialty Exam  Presentation  General Appearance:  Appropriate for Environment; Fairly Groomed  Eye Contact: Fair  Speech: Clear and Coherent  Speech Volume: Normal  Handedness: Right   Mood and Affect  Mood: Euthymic  Duration of Depression Symptoms: No data recorded Affect: Congruent; Appropriate   Thought Process  Thought Processes: Coherent  Descriptions of Associations:Intact  Orientation:Full (Time, Place and Person)  Thought Content:Logical  History of Schizophrenia/Schizoaffective disorder:No data recorded Duration of Psychotic Symptoms:No data recorded Hallucinations:Hallucinations: None  Ideas of Reference:None  Suicidal Thoughts:Suicidal Thoughts: No  Homicidal Thoughts:Homicidal Thoughts: No   Sensorium  Memory: Immediate Good  Judgment: Good  Insight: Good   Executive Functions  Concentration: Good  Attention Span: Good  Recall: Good  Fund of Knowledge: Good  Language: Good   Psychomotor Activity  Psychomotor Activity:Psychomotor Activity: Normal   Assets  Assets: Communication Skills   Sleep  Sleep:Sleep: Good   Physical Exam: Physical Exam Constitutional:      Appearance: Normal appearance.  HENT:     Head: Normocephalic.     Nose: Nose normal. No congestion or rhinorrhea.  Pulmonary:  Effort: Pulmonary effort is normal.  Skin:    General: Skin is warm.  Neurological:     Mental Status: He is oriented to person, place, and time.    Review of Systems  Constitutional: Negative.   HENT: Negative.    Eyes: Negative.   Respiratory:  Negative.    Cardiovascular: Negative.   Gastrointestinal: Negative.   Genitourinary: Negative.   Musculoskeletal: Negative.   Skin: Negative.   Psychiatric/Behavioral:  Positive for depression (denies SI, denies HI, denies AVH, contracts for lsafety outside of [facility]). Negative for hallucinations, memory loss, substance abuse and suicidal ideas. The patient is nervous/anxious (improved) and has insomnia (improved).    Blood pressure (!) 127/94, pulse 89, temperature 97.8 F (36.6 C), temperature source Oral, resp. rate 16, height 6' 3"$  (1.905 m), weight 116.1 kg, SpO2 98 %. Body mass index is 32 kg/m.  Mental Status Per Nursing Assessment::   On Admission:  NA  Demographic Factors:  Male and Unemployed  Loss Factors: Financial problems/change in socioeconomic status  Historical Factors: Impulsivity  Risk Reduction Factors:   Positive therapeutic relationship  Continued Clinical Symptoms:  Patient denies SI, denies HI, denies AVH, denies paranoia and there are no signs of delusional thinking. Pt verbally contracts for safety outside of this [place].  Cognitive Features That Contribute To Risk:  None    Suicide Risk:  Mild:  There are no identifiable suicide plans, no associated intent, mild dysphoria and related symptoms, good self-control (both objective and subjective assessment), few other risk factors, and identifiable protective factors, including available and accessible social support.    Follow-up Information     [facility]. Go on 07/13/2022.   Specialty: Behavioral Health Why: You have an appointment on 07/13/22 at 10:30 am for medication management services.  At that time, you can also schedule an appointment for therapy services. Contact information: [address] [place], NP 07/07/2022, 11:51 AM

## 2022-07-07 NOTE — [facility] Group Notes (Signed)
.  Psychoeducational Group Note    Date:  07/07/2022 Time:1300-1400    Purpose of Group: . The group focus' on teaching patients on how to identify their needs and their Life Skills:  A group where two lists are made. What people need and what are things that we do that are unhealthy. The lists are developed by the patients and it is explained that we often do the actions that are not healthy to get our list of needs met.  Goal:: to develop the coping skills needed to get their needs met  Participation Level:  did not attend  Paulino Rily

## 2022-07-09 LAB — VITAMIN B1: Vitamin B1 (Thiamine): 151 nmol/L (ref 66.5–200.0)

## 2022-07-13 ENCOUNTER — Encounter: Payer: Self-pay | Admitting: Physician Assistant

## 2022-07-13 ENCOUNTER — Ambulatory Visit (INDEPENDENT_AMBULATORY_CARE_PROVIDER_SITE_OTHER): Payer: Medicare HMO | Admitting: Physician Assistant

## 2022-07-13 DIAGNOSIS — F411 Generalized anxiety disorder: Secondary | ICD-10-CM | POA: Diagnosis not present

## 2022-07-13 DIAGNOSIS — F4323 Adjustment disorder with mixed anxiety and depressed mood: Secondary | ICD-10-CM | POA: Diagnosis not present

## 2022-07-13 DIAGNOSIS — F250 Schizoaffective disorder, bipolar type: Secondary | ICD-10-CM

## 2022-07-13 DIAGNOSIS — T424X1D Poisoning by benzodiazepines, accidental (unintentional), subsequent encounter: Secondary | ICD-10-CM

## 2022-07-13 DIAGNOSIS — G2401 Drug induced subacute dyskinesia: Secondary | ICD-10-CM

## 2022-07-13 MED ORDER — TRAZODONE HCL 150 MG PO TABS: 150.0000 mg | ORAL_TABLET | Freq: Every day | ORAL | 1 refills | Status: DC

## 2022-07-13 MED ORDER — GABAPENTIN 400 MG PO CAPS: ORAL_CAPSULE | ORAL | 1 refills | Status: DC

## 2022-07-13 MED ORDER — ARIPIPRAZOLE 20 MG PO TABS: 20.0000 mg | ORAL_TABLET | Freq: Every day | ORAL | 1 refills | Status: DC

## 2022-07-13 NOTE — Progress Notes (Signed)
Flu like symptoms

## 2022-07-13 NOTE — Progress Notes (Signed)
[facility]  Patient ID: Matthew [name],  MRN: [id number]  PCP: Matthew Landsman, MD  Date of Evaluation: 07/13/2022 Time spent:45 minutes  Chief Complaint:  Chief Complaint   Anxiety; Depression; Insomnia; Follow-up    HISTORY/CURRENT STATUS: HPI for hospital follow-up.  He OD'ed on Xanax, hospitalized 06/30/2022 w AMS. Says he was not trying to kill himself. Doesn't know why he overtook the Xanax, it was an impulsive decision. Doesn't know how many he took. At our visit 06/07/2022 for f/u after OD on Xanax, he reported taking no BZ for a week, was having no withdrawals, so I did not re-start it. Any RF were cancelled by the CMA, when I asked him how he got the Rx filled on 06/26/2022, "I don't know. They just gave it to me." Admits he knew we d/c the drug and he shouldn't have gotten it. At the most recent admission, neighbors reported the AMS. Taken by EMS. Was admitted for monitoring due to possible withdrawals, then [facility] because he was not able to contract for safety at that point.   Still anxious, was started on gabapentin at the last visit for anxiety.  That was increased on 06/12/2022 and again 06/19/2022.  It was helping the anxiety up until he was admitted on 06/30/2022.  When he was hospitalized the provider decreased gabapentin and discharged him with Klonopin to wean off.  The perphenazine and lithium were stopped and Abilify was started.  Austedo was also discontinued.  He has not had any mood swings since going off those medications.  He now says the Austedo was not helping the tardive dyskinesia but at other visits he reported that it did.  He is not sure if he is having TD again, he has been chewing more gum so he can tell.  Was also taken off Strattera.  It had been helping with concentration.  He is having more trouble staying focused now that he has been off of it for 2 weeks.  Energy and motivation are good for the most part.  No  extreme sadness, tearfulness, or feelings of hopelessness.  Sleeps ok, with Trazodone.  ADLs and personal hygiene are normal.   Appetite has not changed.  Weight is stable.  Denies laxative use, calorie restricting, or binging and purging.   Denies cutting or any form of self-harm.  Adamantly denies suicidal or homicidal thoughts.  Patient denies increased energy with decreased need for sleep, increased talkativeness, racing thoughts, impulsivity or risky behaviors, increased spending, increased libido, grandiosity, increased irritability or anger, paranoia, or hallucinations.  Review of Systems  Constitutional: Negative.   HENT: Negative.    Eyes: Negative.   Respiratory: Negative.    Cardiovascular: Negative.   Gastrointestinal: Negative.   Genitourinary: Negative.   Musculoskeletal: Negative.   Skin: Negative.   Neurological:        See HPI  Endo/Heme/Allergies: Negative.   Psychiatric/Behavioral:         See HPI   Individual Medical History/ Review of Systems: Changes? :Yes   see HPI and Hospital records.  Past medications for mental health diagnoses include: Risperdal, Zyprexa, Abilify, [name], Klonopin, Gabapentin, Xanax, Valium, Ativan, Hydroxyzine, Trilafon, Atenolol, Cymbalta, Prozac, Lexapro, Paxil, Lithium, Adderall, Ritalin, Mirtazepine, Sonata was ineffective.  Austedo helped some, Strattera  Allergies: Gluten meal and Lactose intolerance (gi)  Current Medications:  Current Outpatient Medications:    aspirin EC 81 MG tablet, Take 1 tablet (81 mg total) by mouth daily. Swallow whole., Disp: 30  tablet, Rfl: 0   atorvastatin (LIPITOR) 80 MG tablet, Take 80 mg by mouth daily., Disp: , Rfl:    carvedilol (COREG) 12.5 MG tablet, Take 3 tablets (37.5 mg total) by mouth 2 (two) times daily., Disp: 180 tablet, Rfl: 0   clonazePAM (KLONOPIN) 0.25 MG disintegrating tablet, Take 1 tablet (0.25 mg total) by mouth 2 (two) times daily., Disp: 60 tablet, Rfl: 0   clonazePAM (KLONOPIN)  0.5 MG tablet, Take 1 tablet (0.5 mg total) by mouth 2 (two) times daily as needed (for anxiety, agitation and benzodiazepine withdrawal)., Disp: 30 tablet, Rfl: 0   clopidogrel (PLAVIX) 75 MG tablet, Take 1 tablet (75 mg total) by mouth daily., Disp: 30 tablet, Rfl: 0   DULoxetine (CYMBALTA) 30 MG capsule, Take 3 capsules (90 mg total) by mouth daily., Disp: 90 capsule, Rfl: 0   fenofibrate 160 MG tablet, Take 1 tablet (160 mg total) by mouth daily., Disp: 30 tablet, Rfl: 0   hydrOXYzine (ATARAX) 25 MG tablet, Take 1 tablet (25 mg total) by mouth every 6 (six) hours as needed for anxiety., Disp: 30 tablet, Rfl: 0   icosapent Ethyl (VASCEPA) 1 g capsule, Take 2 g by mouth 2 (two) times daily., Disp: , Rfl:    insulin glargine-yfgn (SEMGLEE) 100 UNIT/ML injection, Inject 0.18 mLs (18 Units total) into the skin every morning., Disp: 1 mL, Rfl: 0   JARDIANCE 25 MG TABS tablet, Take 25 mg by mouth every morning., Disp: , Rfl:    levothyroxine (SYNTHROID) 25 MCG tablet, Take 1 tablet (25 mcg total) by mouth in the morning., Disp: 30 tablet, Rfl: 0   nicotine polacrilex (NICORETTE) 2 MG gum, Take 1 each (2 mg total) by mouth as needed for smoking cessation., Disp: 30 tablet, Rfl: 0   ARIPiprazole (ABILIFY) 20 MG tablet, Take 1 tablet (20 mg total) by mouth at bedtime., Disp: 30 tablet, Rfl: 1   gabapentin (NEURONTIN) 400 MG capsule, 2 po q am, 1 po at lunch, 1 in the late afternoon, 1 qhs., Disp: 150 capsule, Rfl: 1   traZODone (DESYREL) 150 MG tablet, Take 1 tablet (150 mg total) by mouth at bedtime., Disp: 30 tablet, Rfl: 1 Medication Side Effects: none  Family Medical/ Social History: Changes? No  MENTAL HEALTH EXAM:  There were no vitals taken for this visit.There is no height or weight on file to calculate BMI.  General Appearance: Casual and Well Groomed  Eye Contact:  Good  Speech:  Clear and Coherent and Normal Rate  Volume:  Normal  Mood:  Euthymic  Affect:  Congruent  Thought Process:   Goal Directed and Descriptions of Associations: Circumstantial  Orientation:  Full (Time, Place, and Person)  Thought Content: Logical   Suicidal Thoughts:  No  Homicidal Thoughts:  No  Memory:  WNL  Judgement:  Fair  Insight:  Good  Psychomotor Activity:   Minimal to side movement of mandible, he is chewing gum so difficult to say if the tardive dyskinesia has recurred or not.  No tremors  Concentration:  Concentration: Good  Recall:  Good  Fund of Knowledge: Good  Language: Good  Assets:  Desire for Improvement Financial Resources/Insurance Housing Transportation  ADL's:  Intact  Cognition: WNL  Prognosis:  Good   Pertinent Labs 07/04/2022 Lipid panel total cholesterol 136, triglycerides 247, HDL 19, LDL 68 (known issues, Cardiology follows)  07/02/2022  glucose 172  06/30/2022 Hemoglobin A1c 8.3 (known DM. PCP follows) Lithium level 0.28  DIAGNOSES:    ICD-10-CM  1. Generalized anxiety disorder  F41.1     2. Schizoaffective disorder, bipolar type ([place])  F25.0     3. Situational mixed anxiety and depressive disorder  F43.23     4. Tardive dyskinesia  G24.01     5. Benzodiazepine overdose, accidental or unintentional, subsequent encounter  T42.4X1D       Receiving Psychotherapy: No   RECOMMENDATIONS:  PDMP reviewed.  Xanax filled 06/26/2022.  Klonopin that was prescribed on discharge is not listed in the registry. I provided 45 minutes of face to face time during this encounter, including time spent before and after the visit in records review, medical decision making, counseling pertinent to today's visit, and charting.   Contract for safety in place. Call the office on-call service, 988/hotline, 911, or present to [facility] or ER if any life-threatening psychiatric crisis. Patient verbalizes understanding.   Patient states he threw away the remainder of the bottle of Xanax.  I will have to trust him on that.  He understands that he will get no benzos  from me. Period.  I have asked the CMA to cancel any refills for benzodiazepine that he may have under my name.  He had no refills listed in epic as of 06/12/2022, (see phone note) so I am not exactly sure how he was able to fill the Xanax on 06/26/2022.  He was aware of me discontinuing Xanax at our last appointment on 06/07/2022 at which time he had been off of benzos for 1 week so I did not restart it. At any rate I will no longer prescribe any benzodiazepine and he understands.  He can finish up the Klonopin 0.5 mg as directed per charge from the hospital 5 days ago, then wean to 0.25 mg twice daily but when I see him back in 1 month from now I will have him decrease to 1 p.o. daily and then stop.  Discontinued at discharge were Austedo, Strattera, lithium, perphenazine.  We agreed to stay off those meds, reevaluate in 1 month.  Continue Abilify 20 mg, 1 p.o. daily. Continue Klonopin 0.5 mg, 1 p.o. twice daily for 2 weeks from 07/07/2022, then Klonopin 0.25 mg, 1 p.o. twice daily prescription for 30 days given 07/07/2022. Continue Cymbalta 30 mg, 3 p.o. daily. Increase gabapentin 400 mg to 2 p.o. every morning, 1 at lunch, 1 in the late afternoon, and 1 nightly. Continue hydroxyzine 25 mg, 1 p.o. every 6 hours as needed anxiety. Continue trazodone 150 mg, 1 p.o. nightly as needed. Strongly recommend counseling. Return in 1 month.  Donnal Moat, PA-C

## 2022-07-18 ENCOUNTER — Encounter: Payer: Self-pay | Admitting: Physician Assistant

## 2022-07-18 ENCOUNTER — Ambulatory Visit: Payer: Medicare HMO | Attending: Physician Assistant | Admitting: Physician Assistant

## 2022-07-18 VITALS — BP 138/78 | HR 97 | Ht 75.0 in | Wt 273.6 lb

## 2022-07-18 DIAGNOSIS — I2510 Atherosclerotic heart disease of native coronary artery without angina pectoris: Secondary | ICD-10-CM | POA: Insufficient documentation

## 2022-07-18 DIAGNOSIS — E782 Mixed hyperlipidemia: Secondary | ICD-10-CM | POA: Diagnosis not present

## 2022-07-18 DIAGNOSIS — R002 Palpitations: Secondary | ICD-10-CM | POA: Diagnosis not present

## 2022-07-18 DIAGNOSIS — I5032 Chronic diastolic (congestive) heart failure: Secondary | ICD-10-CM | POA: Insufficient documentation

## 2022-07-18 DIAGNOSIS — Z79899 Other long term (current) drug therapy: Secondary | ICD-10-CM | POA: Diagnosis not present

## 2022-07-18 DIAGNOSIS — E785 Hyperlipidemia, unspecified: Secondary | ICD-10-CM | POA: Insufficient documentation

## 2022-07-18 NOTE — Progress Notes (Signed)
Office Visit    Patient Name: Matthew [name] Date of Encounter: 07/18/2022  PCP:  [name], [place]  Cardiologist:  Freada Bergeron, MD  Advanced Practice Provider:  No care team member to display Electrophysiologist:  None   HPI    Matthew [name] is a 47 y.o. male with past medical history of hypertension, hyperlipidemia, pre-DM, celiac disease, schizoaffective disorder, anxiety, ADHD, OSA not on CPAP, recent NSTEMI status post DES to pLAD and DES to dRCA presents today for follow-up appointment.  He was admitted for NSTEMI 06/06/2020 and treated with DES to pLAD and DES to dRCA and had severely decreased LVEF of 25 to 30%.  Patient left AMA 06/07/2020 and was seen urgently in the clinic by Estella Husk.  During that visit he was more short of breath and had been eating a lot of fast food and not filled his prescriptions for atenolol and lisinopril.  Since that time, he had filled his prescriptions had been taking all his medications as prescribed.  He understands the importance of taking medications and that stopping ASA or Brilinta would be devastating.  Occasionally he was endorsing some palpitation which of shortness of breath and mild lightheadedness.  Episodes occurred about every other day and last for couple minutes before resolving.  He can happen with activity or rest.  No chest pain.  During visit 06/22/2020 the patient had been having palpitations.  He was referred for cardiac monitor and cardiac rehab.  Cardiac monitor showed rare ectopy with no significant arrhythmias.  During his visit on 07/20/2020 he was having intermittent chest tightness.  He had subsequent ER visit 08/10/2020 for abdominal pain.  Workup was reassuring and he was discharged on Imdur 30 mg daily as well as nitro as needed.  He had a visit 08/31/2020 where he was very tachycardic and usually was related with heavy smoking.  Was not taking his Imdur, nitro, Jardiance, or  insulin.  Otherwise doing well from a CV standpoint.  He was seen 10/10/2020 where he was doing well from CV standpoint.  Complained of lower back pain.  Was down to smoking 1 pack a day with improvement occasion compliance.  Admitted 10/7 through 02/25/2021 for chest pain.  Cardiac catheterization showed patent LAD stent, 25% LCx, 25 to 45% proximal RCA disease, anterior hypokinesis with EF 50%.  LVEDP 7 mmHg.  Recommended for continued medical management.  He was readmitted 04/23/2021 for chest pain and syncope.  EKG nonischemic.  Troponin negative.  TTE with LVEF 65%, normal RV, no significant valvular disease.  Symptoms thought to be vagal in nature as he fainted with coughing.  Discharged home.  He saw Diona Browner, NP 05/31/2021 after ED visit for syncope.  He reported his episodes occurred while he was in the bathroom or coughing.  He reported sharp mid lower chest pain that could last for hours up to days and was not improving with nitroglycerin.  He was back to smoking 2 to 3 packs a day.  Lexiscan Myoview was ordered but never performed.  He was then seen for follow-up 07/2021 where he was doing well.  Stop lisinopril and fainting episodes and coughing resolved.  Continue to smoke 2 packs a day.  Started on fenofibrate for persistently elevated TG.  He was last seen by Dr. Johney Frame 11/10/2021 and at that time he was feeling well.  He was working hard on controlling his diabetes with A1c 8.1.  He quit  smoking.  Was wearing a nicotine patch.  Having palpitations with the levothyroxine and nicotine patch while this had since improved.  Exercising and walking more frequently to avoid smoking.  No chest pain, orthopnea, PND, lightheadedness, or syncope.  Today, he states he has been doing well since he was last seen.  He has been finding out smoking.  Behavioral health has made some adjustments to his medications.  He states there is a rawness in his chest likely from smoking.  It goes away when he is  not smoking.  He still has an issue with energy drinks.  He states he can get his heart rate going pretty good after an energy drink.  Heart rate is 97 today and he is taking 37.5 mg twice a day of carvedilol.  Discussed importance of medication compliance.  He states he drinks 2 L of water in the morning with his medications he also drinks water throughout the day.  He is terrible with exercise.  He is continuing on a low-salt diet.  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   Reports no shortness of breath nor dyspnea on exertion. No edema, orthopnea, PND. Reports no palpitations.   Past Medical History    Past Medical History:  Diagnosis Date   Anxiety    Arthritis    Celiac disease    Coronary artery disease    Dairy allergy    Depression    Diabetes mellitus without complication (HCC)    High cholesterol    Hypertension    Hypothyroidism    Myocardial infarction ([place])    Paranoia ([place])    Schizoaffective disorder ([place])    Past Surgical History:  Procedure Laterality Date   APPENDECTOMY     CARDIAC CATHETERIZATION     CORONARY STENT INTERVENTION N/A 06/06/2020   Procedure: CORONARY STENT INTERVENTION;  Surgeon: Martinique, Peter M, MD;  Location: [facility];  Service: Cardiovascular;  Laterality: N/A;  prox LAD, distal RCA   INTRAVASCULAR ULTRASOUND/IVUS N/A 06/06/2020   Procedure: Intravascular Ultrasound/IVUS;  Surgeon: Martinique, Peter M, MD;  Location: [facility];  Service: Cardiovascular;  Laterality: N/A;   LEFT HEART CATH AND CORONARY ANGIOGRAPHY N/A 06/06/2020   Procedure: LEFT HEART CATH AND CORONARY ANGIOGRAPHY;  Surgeon: Martinique, Peter M, MD;  Location: [facility];  Service: Cardiovascular;  Laterality: N/A;   LEFT HEART CATH AND CORONARY ANGIOGRAPHY N/A 02/24/2021   Procedure: LEFT HEART CATH AND CORONARY ANGIOGRAPHY;  Surgeon: Belva Crome, MD;  Location: [facility];  Service: Cardiovascular;   Laterality: N/A;   NASAL SEPTUM SURGERY     TOTAL SHOULDER ARTHROPLASTY Right 11/28/2021   Procedure: TOTAL SHOULDER ARTHROPLASTY;  Surgeon: Marchia Bond, MD;  Location: [facility];  Service: Orthopedics;  Laterality: Right;   WRIST FRACTURE SURGERY     right wrist    Allergies  Allergies  Allergen Reactions   Gluten Meal Other (See Comments)    Celiac disease   Lactose Intolerance (Gi) Diarrhea    Can tolerate hard cheese    EKGs/Labs/Other Studies Reviewed:   The following studies were reviewed today:  Echo 04/24/21  IMPRESSIONS     1. Left ventricular ejection fraction, by estimation, is 60 to 65%. The  left ventricle has normal function. The left ventricle has no regional  wall motion abnormalities. There is mild left ventricular hypertrophy.  Left ventricular diastolic parameters  were normal.   2.  Right ventricular systolic function is normal. The right ventricular  size is normal. Tricuspid regurgitation signal is inadequate for assessing  PA pressure.   3. The mitral valve is normal in structure. No evidence of mitral valve  regurgitation.   4. The aortic valve was not well visualized. Aortic valve regurgitation  is not visualized. No aortic stenosis is present.   FINDINGS   Left Ventricle: Left ventricular ejection fraction, by estimation, is 60  to 65%. The left ventricle has normal function. The left ventricle has no  regional wall motion abnormalities. The left ventricular internal cavity  size was normal in size. There is   mild left ventricular hypertrophy. Left ventricular diastolic parameters  were normal.   Right Ventricle: The right ventricular size is normal. No increase in  right ventricular wall thickness. Right ventricular systolic function is  normal. Tricuspid regurgitation signal is inadequate for assessing PA  pressure.   Left Atrium: Left atrial size was normal in size.   Right Atrium: Right atrial size was normal in size.   Pericardium:  There is no evidence of pericardial effusion.   Mitral Valve: The mitral valve is normal in structure. No evidence of  mitral valve regurgitation.   Tricuspid Valve: The tricuspid valve is normal in structure. Tricuspid  valve regurgitation is trivial.   Aortic Valve: The aortic valve was not well visualized. Aortic valve  regurgitation is not visualized. No aortic stenosis is present.   Pulmonic Valve: The pulmonic valve was not well visualized. Pulmonic valve  regurgitation is not visualized.   Aorta: The aortic root and ascending aorta are structurally normal, with  no evidence of dilitation.   IAS/Shunts: The interatrial septum was not well visualized.   EKG:  EKG is not ordered today.   Recent Labs: 06/30/2022: ALT 42; TSH 0.498 07/01/2022: Hemoglobin 15.2; Platelets 247 07/02/2022: BUN 14; Creatinine, Ser 1.10; Potassium 3.6; Sodium 135  Recent Lipid Panel    Component Value Date/Time   CHOL 136 07/04/2022 0634   CHOL 115 01/29/2022 0809   TRIG 247 (H) 07/04/2022 0634   HDL 19 (L) 07/04/2022 0634   HDL 27 (L) 01/29/2022 0809   CHOLHDL 7.2 07/04/2022 0634   VLDL 49 (H) 07/04/2022 0634   LDLCALC 68 07/04/2022 0634   LDLCALC 54 01/29/2022 0809   LDLDIRECT 42.4 02/25/2021 0139    Home Medications   Current Meds  Medication Sig   ARIPiprazole (ABILIFY) 20 MG tablet Take 1 tablet (20 mg total) by mouth at bedtime.   aspirin EC 81 MG tablet Take 1 tablet (81 mg total) by mouth daily. Swallow whole.   atorvastatin (LIPITOR) 80 MG tablet Take 80 mg by mouth daily.   carvedilol (COREG) 12.5 MG tablet Take 3 tablets (37.5 mg total) by mouth 2 (two) times daily.   clonazePAM (KLONOPIN) 0.25 MG disintegrating tablet Take 1 tablet (0.25 mg total) by mouth 2 (two) times daily.   clonazePAM (KLONOPIN) 0.5 MG tablet Take 1 tablet (0.5 mg total) by mouth 2 (two) times daily as needed (for anxiety, agitation and benzodiazepine withdrawal).   clopidogrel (PLAVIX) 75 MG tablet Take 1  tablet (75 mg total) by mouth daily.   DULoxetine (CYMBALTA) 30 MG capsule Take 3 capsules (90 mg total) by mouth daily.   fenofibrate 160 MG tablet Take 1 tablet (160 mg total) by mouth daily.   gabapentin (NEURONTIN) 400 MG capsule 2 po q am, 1 po at lunch, 1 in the late afternoon, 1 qhs.   hydrOXYzine (  ATARAX) 25 MG tablet Take 1 tablet (25 mg total) by mouth every 6 (six) hours as needed for anxiety.   icosapent Ethyl (VASCEPA) 1 g capsule Take 2 g by mouth 2 (two) times daily.   insulin glargine-yfgn (SEMGLEE) 100 UNIT/ML injection Inject 0.18 mLs (18 Units total) into the skin every morning.   JARDIANCE 25 MG TABS tablet Take 25 mg by mouth every morning.   levothyroxine (SYNTHROID) 25 MCG tablet Take 1 tablet (25 mcg total) by mouth in the morning.   nicotine polacrilex (NICORETTE) 2 MG gum Take 1 each (2 mg total) by mouth as needed for smoking cessation.   traZODone (DESYREL) 150 MG tablet Take 1 tablet (150 mg total) by mouth at bedtime.     Review of Systems      All other systems reviewed and are otherwise negative except as noted above.  Physical Exam    VS:  BP 138/78   Pulse 97   Ht '6\' 3"'$  (1.905 m)   Wt 273 lb 9.6 oz (124.1 kg)   SpO2 94%   BMI 34.20 kg/m  , BMI Body mass index is 34.2 kg/m.  Wt Readings from Last 3 Encounters:  07/18/22 273 lb 9.6 oz (124.1 kg)  07/02/22 256 lb (116.1 kg)  06/30/22 240 lb (108.9 kg)     GEN: Well nourished, well developed, in no acute distress. HEENT: normal. Neck: Supple, no JVD, carotid bruits, or masses. Cardiac: RRR, no murmurs, rubs, or gallops. No clubbing, cyanosis, edema.  Radials/PT 2+ and equal bilaterally.  Respiratory:  Respirations regular and unlabored, clear to auscultation bilaterally. GI: Soft, nontender, nondistended. MS: No deformity or atrophy. Skin: Warm and dry, no rash. Neuro:  Strength and sensation are intact. Psych: Normal affect.  Assessment & Plan    NSTEMI/multivessel CAD -Continue current  medications which include aspirin 81 mg daily, Lipitor 80 mg daily, Coreg 37.5 mg twice daily, Plavix 75 mg daily, Jardiance 25 mg daily, Vascepa 2 g twice a day, Nicorette gum -He continues to smoke on and off but really making an effort to quit -Medication compliance has improved -No recent chest pain  Dyslipidemia -Continue Lipitor 80 mg daily, fenofibrate 160 mg daily, Vascepa 2 g twice a day -LDL 68, triglycerides 247  -Followed by lipid clinic -repeat lipid panel   Heart failure with improved EF/ischemic cardiomyopathy -LVEF improved from 25 to 30% to 65 to 70% on TTE 08/12/2020 -Euvolemic on exam -Continue current medication regimen -Low-sodium diet  Syncope -no further issues  5. Palpitations -associated with energy drinks -highly encouraged to avoid energy drinks and caffeine in general -maintain hydration          Disposition: Follow up 5 months with Freada Bergeron, MD or APP.  Signed, Elgie Collard, PA-C 07/18/2022, 5:41 PM [facility]

## 2022-07-18 NOTE — Patient Instructions (Addendum)
Medication Instructions:  Your physician recommends that you continue on your current medications as directed. Please refer to the Current Medication list given to you today.  *If you need a refill on your cardiac medications before your next appointment, please call your pharmacy*   Lab Work: Fasting lipids and lft's in 2 months If you have labs (blood work) drawn today and your tests are completely normal, you will receive your results only by: [place] (if you have MyChart) OR A paper copy in the mail If you have any lab test that is abnormal or we need to change your treatment, we will call you to review the results.   Follow-Up: At [facility], you and your health needs are our priority.  As part of our continuing mission to provide you with exceptional heart care, we have created designated Provider Care Teams.  These Care Teams include your primary Cardiologist (physician) and Advanced Practice Providers (APPs -  Physician Assistants and Nurse Practitioners) who all work together to provide you with the care you need, when you need it.   Your next appointment:   Keep 08/28/22 11:45 AM appointment with Ermalinda Barrios, PA-C  Schedule 5 month follow up with Dr Johney Frame  Other Instructions Medication compliance is advised  Low-Sodium Eating Plan Sodium, which is an element that makes up salt, helps you maintain a healthy balance of fluids in your body. Too much sodium can increase your blood pressure and cause fluid and waste to be held in your body. Your health care provider or dietitian may recommend following this plan if you have high blood pressure (hypertension), kidney disease, liver disease, or heart failure. Eating less sodium can help lower your blood pressure, reduce swelling, and protect your heart, liver, and kidneys. What are tips for following this plan? Reading food labels The Nutrition Facts label lists the amount of sodium in one serving of the food.  If you eat more than one serving, you must multiply the listed amount of sodium by the number of servings. Choose foods with less than 140 mg of sodium per serving. Avoid foods with 300 mg of sodium or more per serving. Shopping  Look for lower-sodium products, often labeled as "low-sodium" or "no salt added." Always check the sodium content, even if foods are labeled as "unsalted" or "no salt added." Buy fresh foods. Avoid canned foods and pre-made or frozen meals. Avoid canned, cured, or processed meats. Buy breads that have less than 80 mg of sodium per slice. Cooking  Eat more home-cooked food and less restaurant, buffet, and fast food. Avoid adding salt when cooking. Use salt-free seasonings or herbs instead of table salt or sea salt. Check with your health care provider or pharmacist before using salt substitutes. Cook with plant-based oils, such as canola, sunflower, or olive oil. Meal planning When eating at a restaurant, ask that your food be prepared with less salt or no salt, if possible. Avoid dishes labeled as brined, pickled, cured, smoked, or made with soy sauce, miso, or teriyaki sauce. Avoid foods that contain MSG (monosodium glutamate). MSG is sometimes added to [place] food, bouillon, and some canned foods. Make meals that can be grilled, baked, poached, roasted, or steamed. These are generally made with less sodium. General information Most people on this plan should limit their sodium intake to 1,500-2,000 mg (milligrams) of sodium each day. What foods should I eat? Fruits Fresh, frozen, or canned fruit. Fruit juice. Vegetables Fresh or frozen vegetables. "No salt added"  canned vegetables. "No salt added" tomato sauce and paste. Low-sodium or reduced-sodium tomato and vegetable juice. Grains Low-sodium cereals, including oats, puffed wheat and rice, and shredded wheat. Low-sodium crackers. Unsalted rice. Unsalted pasta. Low-sodium bread. Whole-grain breads and  whole-grain pasta. Meats and other proteins Fresh or frozen (no salt added) meat, poultry, seafood, and fish. Low-sodium canned tuna and salmon. Unsalted nuts. Dried peas, beans, and lentils without added salt. Unsalted canned beans. Eggs. Unsalted nut butters. Dairy Milk. Soy milk. Cheese that is naturally low in sodium, such as ricotta cheese, fresh mozzarella, or Swiss cheese. Low-sodium or reduced-sodium cheese. Cream cheese. Yogurt. Seasonings and condiments Fresh and dried herbs and spices. Salt-free seasonings. Low-sodium mustard and ketchup. Sodium-free salad dressing. Sodium-free light mayonnaise. Fresh or refrigerated horseradish. Lemon juice. Vinegar. Other foods Homemade, reduced-sodium, or low-sodium soups. Unsalted popcorn and pretzels. Low-salt or salt-free chips. The items listed above may not be a complete list of foods and beverages you can eat. Contact a dietitian for more information. What foods should I avoid? Vegetables Sauerkraut, pickled vegetables, and relishes. Olives. [place] fries. Onion rings. Regular canned vegetables (not low-sodium or reduced-sodium). Regular canned tomato sauce and paste (not low-sodium or reduced-sodium). Regular tomato and vegetable juice (not low-sodium or reduced-sodium). Frozen vegetables in sauces. Grains Instant hot cereals. Bread stuffing, pancake, and biscuit mixes. Croutons. Seasoned rice or pasta mixes. Noodle soup cups. Boxed or frozen macaroni and cheese. Regular salted crackers. Self-rising flour. Meats and other proteins Meat or fish that is salted, canned, smoked, spiced, or pickled. Precooked or cured meat, such as sausages or meat loaves. Berniece Salines. Ham. Pepperoni. Hot dogs. Corned beef. Chipped beef. Salt pork. Jerky. Pickled herring. Anchovies and sardines. Regular canned tuna. Salted nuts. Dairy Processed cheese and cheese spreads. Hard cheeses. Cheese curds. Blue cheese. Feta cheese. String cheese. Regular cottage cheese.  Buttermilk. Canned milk. Fats and oils Salted butter. Regular margarine. Ghee. Bacon fat. Seasonings and condiments Onion salt, garlic salt, seasoned salt, table salt, and sea salt. Canned and packaged gravies. Worcestershire sauce. Tartar sauce. Barbecue sauce. Teriyaki sauce. Soy sauce, including reduced-sodium. Steak sauce. Fish sauce. Oyster sauce. Cocktail sauce. Horseradish that you find on the shelf. Regular ketchup and mustard. Meat flavorings and tenderizers. Bouillon cubes. Hot sauce. Pre-made or packaged marinades. Pre-made or packaged taco seasonings. Relishes. Regular salad dressings. Salsa. Other foods Salted popcorn and pretzels. Corn chips and puffs. Potato and tortilla chips. Canned or dried soups. Pizza. Frozen entrees and pot pies. The items listed above may not be a complete list of foods and beverages you should avoid. Contact a dietitian for more information. Summary Eating less sodium can help lower your blood pressure, reduce swelling, and protect your heart, liver, and kidneys. Most people on this plan should limit their sodium intake to 1,500-2,000 mg (milligrams) of sodium each day. Canned, boxed, and frozen foods are high in sodium. Restaurant foods, fast foods, and pizza are also very high in sodium. You also get sodium by adding salt to food. Try to cook at home, eat more fresh fruits and vegetables, and eat less fast food and canned, processed, or prepared foods. This information is not intended to replace advice given to you by your health care provider. Make sure you discuss any questions you have with your health care provider. Document Revised: 04/13/2019 Document Reviewed: 04/08/2019 Elsevier Patient Education  [place].

## 2022-08-01 ENCOUNTER — Other Ambulatory Visit: Payer: Self-pay | Admitting: Physician Assistant

## 2022-08-01 ENCOUNTER — Telehealth: Payer: Self-pay | Admitting: Physician Assistant

## 2022-08-01 ENCOUNTER — Other Ambulatory Visit: Payer: Self-pay

## 2022-08-01 DIAGNOSIS — E1165 Type 2 diabetes mellitus with hyperglycemia: Secondary | ICD-10-CM | POA: Diagnosis not present

## 2022-08-01 MED ORDER — GABAPENTIN 800 MG PO TABS: 800.0000 mg | ORAL_TABLET | Freq: Three times a day (TID) | ORAL | 1 refills | Status: DC

## 2022-08-01 NOTE — Telephone Encounter (Signed)
Let's increase to 800 mg tid. He can finish the 400 mg pills, 2 po tid. Please cancel the RF he has on the 400 mg and pend 800 mg, 90# 1 tid. Thanks

## 2022-08-01 NOTE — Telephone Encounter (Signed)
Pt called and said that he is experiencing a lot of anxiety. He would like his gabapentin to be increased. Please call him and let him know if that is ok with teresa. His number is [phone]

## 2022-08-01 NOTE — Telephone Encounter (Signed)
Rx canceled, pended new Rx as requested. Patient notified.

## 2022-08-01 NOTE — Telephone Encounter (Signed)
Patient is asking again to increase gabapentin due to anxiety. He is currently taking five 400 mg capsules daily.

## 2022-08-10 ENCOUNTER — Ambulatory Visit (INDEPENDENT_AMBULATORY_CARE_PROVIDER_SITE_OTHER): Payer: Medicare HMO | Admitting: Physician Assistant

## 2022-08-10 ENCOUNTER — Encounter: Payer: Self-pay | Admitting: Physician Assistant

## 2022-08-10 DIAGNOSIS — G2401 Drug induced subacute dyskinesia: Secondary | ICD-10-CM

## 2022-08-10 DIAGNOSIS — F17200 Nicotine dependence, unspecified, uncomplicated: Secondary | ICD-10-CM

## 2022-08-10 DIAGNOSIS — F902 Attention-deficit hyperactivity disorder, combined type: Secondary | ICD-10-CM

## 2022-08-10 DIAGNOSIS — F250 Schizoaffective disorder, bipolar type: Secondary | ICD-10-CM | POA: Diagnosis not present

## 2022-08-10 DIAGNOSIS — F99 Mental disorder, not otherwise specified: Secondary | ICD-10-CM | POA: Diagnosis not present

## 2022-08-10 DIAGNOSIS — F411 Generalized anxiety disorder: Secondary | ICD-10-CM | POA: Diagnosis not present

## 2022-08-10 DIAGNOSIS — F5105 Insomnia due to other mental disorder: Secondary | ICD-10-CM

## 2022-08-10 MED ORDER — DULOXETINE HCL 30 MG PO CPEP: 90.0000 mg | ORAL_CAPSULE | Freq: Every day | ORAL | 5 refills | Status: DC

## 2022-08-10 MED ORDER — VALBENAZINE TOSYLATE 40 MG PO CAPS: 40.0000 mg | ORAL_CAPSULE | Freq: Every day | ORAL | 1 refills | Status: DC

## 2022-08-10 NOTE — Progress Notes (Signed)
[facility]  Patient ID: Matthew [name],  MRN: [id number]  PCP: [name], MD  Date of Evaluation: 08/10/2022 Time spent:30 minutes  Chief Complaint:  Chief Complaint   Anxiety; Depression; Insomnia; Follow-up    HISTORY/CURRENT STATUS: HPI For routine med check.   Given Klonopin 0.25 mg by Robert Bellow, NP, on 07/28/2022. See PDMP. States it didn't work so he's not taking. Still very anxious, we increased Gabapentin last week and that's helped the PA but not anxiety in general. He never picked up the Hydroxyzine that was prescribed when he was discharged from the hospital last month.   Mandibular side to side motion, off [place] for about 5 weeks. He doesn't notice that but is sticking his tongue out a lot. Not chewing his tongue or jaw.   Not enjoying much but he helps his dad out w/ Mom, full-time caregiver (she had a stroke several years ago) Energy and motivation are fair to good.  No extreme sadness, tearfulness, or feelings of hopelessness.  Sleeps well but has to take Trazodone every night.  ADLs and personal hygiene are normal.  Still has trouble with focus/concentration.  Appetite has not changed.  Weight is stable.  Denies suicidal or homicidal thoughts.  Patient denies increased energy with decreased need for sleep, increased talkativeness, racing thoughts, impulsivity or risky behaviors, increased spending, increased libido, grandiosity, increased irritability or anger, paranoia, or hallucinations.  Review of Systems  Constitutional: Negative.   HENT: Negative.    Eyes: Negative.   Respiratory: Negative.    Cardiovascular: Negative.   Gastrointestinal: Negative.   Genitourinary: Negative.   Musculoskeletal:  Positive for back pain.  Skin: Negative.   Neurological: Negative.   Endo/Heme/Allergies: Negative.   Psychiatric/Behavioral:         See HPI   Individual Medical History/ Review of Systems: Changes? :Yes   chronic back pain. Hurting  more again. "I've got to go back to the pain specialist."   Past medications for mental health diagnoses include: Risperdal, Zyprexa, Abilify, [name], Klonopin, Gabapentin, Xanax, Valium, Ativan, Hydroxyzine, Trilafon, Atenolol, Cymbalta, Prozac, Lexapro, Paxil, Lithium, Adderall, Ritalin, Mirtazepine, Sonata was ineffective.  Austedo helped some, Strattera  Allergies: Gluten meal and Lactose intolerance (gi)  Current Medications:  Current Outpatient Medications:    ARIPiprazole (ABILIFY) 20 MG tablet, Take 1 tablet (20 mg total) by mouth at bedtime., Disp: 30 tablet, Rfl: 1   aspirin EC 81 MG tablet, Take 1 tablet (81 mg total) by mouth daily. Swallow whole., Disp: 30 tablet, Rfl: 0   atorvastatin (LIPITOR) 80 MG tablet, Take 80 mg by mouth daily., Disp: , Rfl:    carvedilol (COREG) 12.5 MG tablet, Take 3 tablets (37.5 mg total) by mouth 2 (two) times daily., Disp: 180 tablet, Rfl: 0   clopidogrel (PLAVIX) 75 MG tablet, Take 1 tablet (75 mg total) by mouth daily., Disp: 30 tablet, Rfl: 0   fenofibrate 160 MG tablet, Take 1 tablet (160 mg total) by mouth daily., Disp: 30 tablet, Rfl: 0   gabapentin (NEURONTIN) 800 MG tablet, Take 1 tablet (800 mg total) by mouth 3 (three) times daily., Disp: 90 tablet, Rfl: 1   icosapent Ethyl (VASCEPA) 1 g capsule, Take 2 g by mouth 2 (two) times daily., Disp: , Rfl:    insulin glargine-yfgn (SEMGLEE) 100 UNIT/ML injection, Inject 0.18 mLs (18 Units total) into the skin every morning., Disp: 1 mL, Rfl: 0   JARDIANCE 25 MG TABS tablet, Take 25 mg by mouth every morning., Disp: ,  Rfl:    levothyroxine (SYNTHROID) 25 MCG tablet, Take 1 tablet (25 mcg total) by mouth in the morning., Disp: 30 tablet, Rfl: 0   trazodone (DESYREL) 300 MG tablet, Take 300 mg by mouth at bedtime., Disp: , Rfl:    valbenazine (INGREZZA) 40 MG capsule, Take 1 capsule (40 mg total) by mouth daily., Disp: 30 capsule, Rfl: 1   DULoxetine (CYMBALTA) 30 MG capsule, Take 3 capsules (90 mg  total) by mouth daily., Disp: 90 capsule, Rfl: 5   hydrOXYzine (ATARAX) 25 MG tablet, Take 1 tablet (25 mg total) by mouth every 6 (six) hours as needed for anxiety. (Patient not taking: Reported on 08/10/2022), Disp: 30 tablet, Rfl: 0   nicotine polacrilex (NICORETTE) 2 MG gum, Take 1 each (2 mg total) by mouth as needed for smoking cessation. (Patient not taking: Reported on 08/10/2022), Disp: 30 tablet, Rfl: 0 Medication Side Effects: none  Family Medical/ Social History: Changes? No  MENTAL HEALTH EXAM:  There were no vitals taken for this visit.There is no height or weight on file to calculate BMI.  General Appearance: Casual and Well Groomed  Eye Contact:  Good  Speech:  Clear and Coherent and Normal Rate  Volume:  Normal  Mood:  Euthymic  Affect:  Congruent  Thought Process:  Goal Directed and Descriptions of Associations: Circumstantial  Orientation:  Full (Time, Place, and Person)  Thought Content: Logical   Suicidal Thoughts:  No  Homicidal Thoughts:  No  Memory:  WNL  Judgement:  Fair  Insight:  Good  Psychomotor Activity:   side to side movement of mandible, almost constant  Concentration:  Concentration: Good  Recall:  Good  Fund of Knowledge: Good  Language: Good  Assets:  Desire for Improvement Financial Resources/Insurance Housing Transportation  ADL's:  Intact  Cognition: WNL  Prognosis:  Good   DIAGNOSES:    ICD-10-CM   1. Generalized anxiety disorder  F41.1     2. Schizoaffective disorder, bipolar type ([place])  F25.0     3. Tardive dyskinesia  G24.01     4. Attention deficit hyperactivity disorder (ADHD), combined type, moderate  F90.2     5. Smoker  F17.200     6. Insomnia due to other mental disorder  F51.05    F99      Receiving Psychotherapy: No   RECOMMENDATIONS:  PDMP reviewed.  Xanax filled 06/26/2022.  Klonopin filled 07/28/2022 by Dr. Mliss Fritz.  Gabapentin 800 mg 90 pills by me on 08/01/2022.  Gabapentin 400 mg by Dr. Mliss Fritz on  07/18/2022. I provided 30 minutes of face to face time during this encounter, including time spent before and after the visit in records review, medical decision making, counseling pertinent to today's visit, and charting.   Recommend Ingrezza.  Benefits, risk and side effects were discussed and he accepts.  A 2-week supply of samples were given to him and prescription sent to [place].  He understands to answer if they call him.  Smoking cessation discussed. Sleep hygiene discussed.  Discussed the anxiety.  The panic attacks are better since the increased dose of gabapentin.  He needs to stay on the current dose for at least another month and I think the anxiety will continue to improve.  It is important that he picks up the hydroxyzine at the pharmacy for rescue purposes.  Continue Abilify 20 mg, 1 p.o. daily. Continue Cymbalta 30 mg, 3 p.o. daily. Continue gabapentin 800 mg, 1 p.o. 3 times daily. Start hydroxyzine 25  mg, 1 p.o. every 6 hours as needed anxiety. Continue trazodone 300 mg, 1 p.o. nightly as needed. Start Ingrezza 40 mg, 2-week sample pack given and prescription sent. Strongly recommend counseling. Return in 1 month.  Donnal Moat, PA-C

## 2022-08-15 NOTE — Progress Notes (Deleted)
Cardiology Office Note:    Date:  08/15/2022   ID:  HURSCHEL ISHMAEL, DOB 1976/02/22, MRN [id number]  PCP:  [name], [place] Prov[facility] Cardiologist:  Freada Bergeron, MD { Click to update primary MD,subspecialty MD or APP then REFRESH:1}  *** Referring MD: [name], MD   Chief Complaint:  No chief complaint on file. {Click here for Visit Info    :1}    History of Present Illness:   Matthew [name] is a 47 y.o. male with  with past medical history of hypertension, hyperlipidemia, pre-DM, celiac disease, schizoaffective disorder, anxiety, ADHD, OSA not on CPAP, NSTEMI 1/2022status post DES to pLAD and DES to dRCA nd had severely decreased LVEF of 25 to 30%.  Patient left AMA 06/07/2020 and was seen urgently in the clinic by me During that visit he was more short of breath and had been eating a lot of fast food and not filled his prescriptions for atenolol and lisinopril.       During his visit on 07/20/2020 he was having intermittent chest tightness.  He had subsequent ER visit 08/10/2020 for abdominal pain.  Workup was reassuring and he was discharged on Imdur 30 mg daily as well as nitro as needed.   He had a visit 08/31/2020 where he was very tachycardic and usually was related with heavy smoking.  Was not taking his Imdur, nitro, Jardiance, or insulin.  Otherwise doing well from a CV standpoint.  Admitted 10/7 through 02/25/2021 for chest pain.  Cardiac catheterization showed patent LAD stent, 25% LCx, 25 to 45% proximal RCA disease, anterior hypokinesis with EF 50%.  LVEDP 7 mmHg.  Recommended for continued medical management.   He was readmitted 04/23/2021 for chest pain and syncope.  EKG nonischemic.  Troponin negative.  TTE with LVEF 65%, normal RV, no significant valvular disease.  Symptoms thought to be vagal in nature as he fainted with coughing.  Discharged home.   He saw Diona Browner, NP 05/31/2021 after ED visit for syncope.  He reported his episodes  occurred while he was in the bathroom or coughing.  He reported sharp mid lower chest pain that could last for hours up to days and was not improving with nitroglycerin.  He was back to smoking 2 to 3 packs a day.  Lexiscan Myoview was ordered but never performed.  Patient most recently saw Ms. Harriet Pho 07/18/2022 was stable.  He was encouraged to avoid energy drinks and caffeine.       Past Medical History:  Diagnosis Date   Anxiety    Arthritis    Celiac disease    Coronary artery disease    Dairy allergy    Depression    Diabetes mellitus without complication ([place])    High cholesterol    Hypertension    Hypothyroidism    Myocardial infarction ([place])    Paranoia ([place])    Schizoaffective disorder ([place])    Current Medications: No outpatient medications have been marked as taking for the 08/28/22 encounter (Appointment) with Imogene Burn, PA-C.    Allergies:   Gluten meal and Lactose intolerance (gi)   Social History   Tobacco Use   Smoking status: Every Day    Packs/day: 1    Types: Cigarettes    Last attempt to quit: 10/19/2021    Years since quitting: 0.8   Smokeless tobacco: Never  Vaping Use   Vaping Use: Former  Substance Use Topics   Alcohol use: No  Alcohol/week: 0.0 standard drinks of alcohol    Comment: no (02/08/15)   Drug use: No    Family Hx: The patient's family history includes Asthma in his mother; Hypertension in his father and mother.  ROS     Physical Exam:    VS:  There were no vitals taken for this visit.    Wt Readings from Last 3 Encounters:  07/18/22 273 lb 9.6 oz (124.1 kg)  06/30/22 240 lb (108.9 kg)  03/19/22 275 lb (124.7 kg)    Physical Exam  GEN: Well nourished, well developed, in no acute distress  HEENT: normal  Neck: no JVD, carotid bruits, or masses Cardiac:RRR; no murmurs, rubs, or gallops  Respiratory:  clear to auscultation bilaterally, normal work of breathing GI: soft, nontender, nondistended, + BS Ext: without  cyanosis, clubbing, or edema, Good distal pulses bilaterally MS: no deformity or atrophy  Skin: warm and dry, no rash Neuro:  Alert and Oriented x 3, Strength and sensation are intact Psych: euthymic mood, full affect        EKGs/Labs/Other Test Reviewed:    EKG:  EKG is *** ordered today.  The ekg ordered today demonstrates ***  Recent Labs: 06/30/2022: ALT 42; TSH 0.498 07/01/2022: Hemoglobin 15.2; Platelets 247 07/02/2022: BUN 14; Creatinine, Ser 1.10; Potassium 3.6; Sodium 135   Recent Lipid Panel Recent Labs    07/04/22 0634  CHOL 136  TRIG 247*  HDL 19*  VLDL 49*  LDLCALC 68     Prior CV Studies: {Select studies to display:26339}  ***   Risk Assessment/Calculations/Metrics:   {Does this patient have ATRIAL FIBRILLATION?:[phone]}     No BP recorded.  {Refresh Note OR Click here to enter BP  :1}***    ASSESSMENT & PLAN:   No problem-specific Assessment & Plan notes found for this encounter.           {Are you ordering a CV Procedure (e.g. stress test, cath, DCCV, TEE, etc)?   Press F2        :[id number]   Dispo:  No follow-ups on file.   Medication Adjustments/Labs and Tests Ordered: Current medicines are reviewed at length with the patient today.  Concerns regarding medicines are outlined above.  Tests Ordered: No orders of the defined types were placed in this encounter.  Medication Changes: No orders of the defined types were placed in this encounter.  Signed, Ermalinda Barrios, PA-C  08/15/2022 2:54 PM    [address], [place], [place]  [id number] Phone: [phone]; Fax: [phone]

## 2022-08-21 ENCOUNTER — Telehealth: Payer: Self-pay | Admitting: Physician Assistant

## 2022-08-21 MED ORDER — HYDROXYZINE HCL 25 MG PO TABS: 25.0000 mg | ORAL_TABLET | Freq: Four times a day (QID) | ORAL | 0 refills | Status: DC | PRN

## 2022-08-21 NOTE — Telephone Encounter (Signed)
Sent!

## 2022-08-21 NOTE — Telephone Encounter (Signed)
Patient lvm requesting a refill on the Hydroxyzine. This medication was prescribed while he was in the hospital, but stated Helene Kelp told him she would refill it. Fill at [id number] - [place], [place]. AT [address], [place] [address] Phone: [phone]  Fax: [phone]   Appointment 09/07/22 Contact information # [phone]

## 2022-08-21 NOTE — Telephone Encounter (Signed)
CMM sent PA request for Matthew [name] [id number]

## 2022-08-27 ENCOUNTER — Telehealth: Payer: Self-pay

## 2022-08-27 NOTE — Telephone Encounter (Signed)
Prior Authorization submitted for Ingrezza 40 mg with [employer], response back is PA already approved through 05/21/2023. Will check with [facility] to confirm approval.

## 2022-08-28 ENCOUNTER — Other Ambulatory Visit: Payer: Self-pay

## 2022-08-28 ENCOUNTER — Telehealth: Payer: Self-pay | Admitting: Physician Assistant

## 2022-08-28 ENCOUNTER — Ambulatory Visit: Payer: Medicare HMO | Admitting: Physician Assistant

## 2022-08-28 MED ORDER — VALBENAZINE TOSYLATE 80 MG PO CAPS: 80.0000 mg | ORAL_CAPSULE | Freq: Every day | ORAL | 2 refills | Status: DC

## 2022-08-28 NOTE — Telephone Encounter (Signed)
[employer] and they report pt told them he has not noticed really any difference with starting Ingrezza 40 mg. I called pt this morning and he does report the same. Informed him I would mention to Rosey Bath and see if she wants to increase to 60 mg or go on to 80 mg and I would let him know and update his Rx with [place].

## 2022-08-28 NOTE — Telephone Encounter (Signed)
[employer] [facility] approved INGREZZA 40mg  for pt. (08/27/22-05/21/23)

## 2022-08-28 NOTE — Telephone Encounter (Signed)
I updated his Rx with [place] and pt is aware of dose increase.

## 2022-08-28 NOTE — Telephone Encounter (Signed)
Let's increase to 80 mg daily. Do I need to send Rx to [place]?

## 2022-08-31 NOTE — Telephone Encounter (Signed)
This has already been approved see other phone message

## 2022-09-02 ENCOUNTER — Other Ambulatory Visit: Payer: Self-pay | Admitting: Physician Assistant

## 2022-09-06 ENCOUNTER — Other Ambulatory Visit: Payer: Self-pay | Admitting: Physician Assistant

## 2022-09-06 NOTE — Telephone Encounter (Signed)
Has appt. tomorrow

## 2022-09-07 ENCOUNTER — Encounter: Payer: Self-pay | Admitting: Physician Assistant

## 2022-09-07 ENCOUNTER — Ambulatory Visit (INDEPENDENT_AMBULATORY_CARE_PROVIDER_SITE_OTHER): Payer: Medicare HMO | Admitting: Physician Assistant

## 2022-09-07 DIAGNOSIS — F99 Mental disorder, not otherwise specified: Secondary | ICD-10-CM | POA: Diagnosis not present

## 2022-09-07 DIAGNOSIS — G2401 Drug induced subacute dyskinesia: Secondary | ICD-10-CM | POA: Diagnosis not present

## 2022-09-07 DIAGNOSIS — F411 Generalized anxiety disorder: Secondary | ICD-10-CM | POA: Diagnosis not present

## 2022-09-07 DIAGNOSIS — F250 Schizoaffective disorder, bipolar type: Secondary | ICD-10-CM | POA: Diagnosis not present

## 2022-09-07 DIAGNOSIS — F5105 Insomnia due to other mental disorder: Secondary | ICD-10-CM | POA: Diagnosis not present

## 2022-09-07 DIAGNOSIS — F902 Attention-deficit hyperactivity disorder, combined type: Secondary | ICD-10-CM | POA: Diagnosis not present

## 2022-09-07 DIAGNOSIS — F17200 Nicotine dependence, unspecified, uncomplicated: Secondary | ICD-10-CM | POA: Diagnosis not present

## 2022-09-07 MED ORDER — HYDROXYZINE HCL 50 MG PO TABS: 50.0000 mg | ORAL_TABLET | Freq: Three times a day (TID) | ORAL | 1 refills | Status: DC | PRN

## 2022-09-07 NOTE — Progress Notes (Unsigned)
[facility]  Patient ID: Matthew [name],  MRN: [id number]  PCP: [name], MD  Date of Evaluation: 09/07/2022 Time spent:20 minutes  Chief Complaint:  Chief Complaint   Anxiety; Follow-up    HISTORY/CURRENT STATUS: HPI For routine med check.   At LOV we started hydroxyzine.  It's helped more with the sleep than anything else. When he takes it during the day, it doesn't help the anxiety very much at all. The gabapentin does help prevent the anxiety some.  he had only been on the current Ingrezza dose for TD at the last OV. It's helped but still has side to side motion of lower jaw, 'not nearly as bad as it was though.'   Feels that mood is stable. Patient is able to enjoy things.  Energy and motivation are good.  No extreme sadness, tearfulness, or feelings of hopelessness.  Sleeps well most of the time. ADLs and personal hygiene are normal.   Denies any changes in concentration, making decisions, or remembering things.  Appetite has not changed.  Weight is stable.  Denies suicidal or homicidal thoughts.  Patient denies increased energy with decreased need for sleep, increased talkativeness, racing thoughts, impulsivity or risky behaviors, increased spending, increased libido, grandiosity, increased irritability or anger, paranoia, or hallucinations.  Denies dizziness, syncope, seizures, numbness, tingling, tremor, tics, unsteady gait, slurred speech, confusion. Denies muscle or joint pain, stiffness, or dystonia. Denies unexplained weight loss, frequent infections, or sores that heal slowly.  No polyphagia, polydipsia, or polyuria. Denies visual changes or paresthesias.   Individual Medical History/ Review of Systems: Changes? :No     Past medications for mental health diagnoses include: Risperdal, Zyprexa, Abilify, [name], Klonopin, Gabapentin, Xanax, Valium, Ativan, Hydroxyzine, Trilafon, Atenolol, Cymbalta, Prozac, Lexapro, Paxil, Lithium, Adderall, Ritalin,  Mirtazepine, Sonata was ineffective.  Austedo helped some, Strattera  Allergies: Gluten meal and Lactose intolerance (gi)  Current Medications:  Current Outpatient Medications:    ARIPiprazole (ABILIFY) 20 MG tablet, TAKE 1 TABLET BY MOUTH EVERYDAY AT BEDTIME, Disp: 30 tablet, Rfl: 1   aspirin EC 81 MG tablet, Take 1 tablet (81 mg total) by mouth daily. Swallow whole., Disp: 30 tablet, Rfl: 0   atorvastatin (LIPITOR) 80 MG tablet, Take 80 mg by mouth daily., Disp: , Rfl:    carvedilol (COREG) 12.5 MG tablet, Take 3 tablets (37.5 mg total) by mouth 2 (two) times daily., Disp: 180 tablet, Rfl: 0   clopidogrel (PLAVIX) 75 MG tablet, Take 1 tablet (75 mg total) by mouth daily., Disp: 30 tablet, Rfl: 0   DULoxetine (CYMBALTA) 30 MG capsule, TAKE 3 CAPSULES BY MOUTH EVERY DAY, Disp: 90 capsule, Rfl: 1   fenofibrate 160 MG tablet, Take 1 tablet (160 mg total) by mouth daily., Disp: 30 tablet, Rfl: 0   gabapentin (NEURONTIN) 800 MG tablet, Take 1 tablet (800 mg total) by mouth 3 (three) times daily., Disp: 90 tablet, Rfl: 1   hydrOXYzine (ATARAX) 50 MG tablet, Take 1 tablet (50 mg total) by mouth 3 (three) times daily as needed., Disp: 90 tablet, Rfl: 1   icosapent Ethyl (VASCEPA) 1 g capsule, Take 2 g by mouth 2 (two) times daily., Disp: , Rfl:    insulin glargine-yfgn (SEMGLEE) 100 UNIT/ML injection, Inject 0.18 mLs (18 Units total) into the skin every morning., Disp: 1 mL, Rfl: 0   JARDIANCE 25 MG TABS tablet, Take 25 mg by mouth every morning., Disp: , Rfl:    levothyroxine (SYNTHROID) 25 MCG tablet, Take 1 tablet (25 mcg total)  by mouth in the morning., Disp: 30 tablet, Rfl: 0   trazodone (DESYREL) 300 MG tablet, Take 300 mg by mouth at bedtime., Disp: , Rfl:    valbenazine (INGREZZA) 80 MG capsule, Take 1 capsule (80 mg total) by mouth daily., Disp: 30 capsule, Rfl: 2   nicotine polacrilex (NICORETTE) 2 MG gum, Take 1 each (2 mg total) by mouth as needed for smoking cessation. (Patient not taking:  Reported on 08/10/2022), Disp: 30 tablet, Rfl: 0 Medication Side Effects: none  Family Medical/ Social History: Changes? No  MENTAL HEALTH EXAM:  There were no vitals taken for this visit.There is no height or weight on file to calculate BMI.  General Appearance: Casual and Well Groomed  Eye Contact:  Good  Speech:  Clear and Coherent and Normal Rate  Volume:  Normal  Mood:  Euthymic  Affect:  Congruent  Thought Process:  Goal Directed and Descriptions of Associations: Circumstantial  Orientation:  Full (Time, Place, and Person)  Thought Content: Logical   Suicidal Thoughts:  No  Homicidal Thoughts:  No  Memory:  WNL  Judgement:  Fair  Insight:  Good  Psychomotor Activity:   occas side to side motion of mandible  Concentration:  Concentration: Good  Recall:  Good  Fund of Knowledge: Good  Language: Good  Assets:  Desire for Improvement Financial Resources/Insurance Housing Transportation  ADL's:  Intact  Cognition: WNL  Prognosis:  Good   DIAGNOSES:    ICD-10-CM   1. Generalized anxiety disorder  F41.1     2. Tardive dyskinesia  G24.01     3. Schizoaffective disorder, bipolar type  F25.0     4. Attention deficit hyperactivity disorder (ADHD), combined type, moderate  F90.2     5. Insomnia due to other mental disorder  F51.05    F99     6. Smoker  F17.200       Receiving Psychotherapy: No   RECOMMENDATIONS:  PDMP reviewed.  Klonopin filled 07/28/2022 by Dr. Carlyn Reichert.  Gabapentin 800 mg 90 pills by me on 08/30/2022.   I provided 20 minutes of face to face time during this encounter, including time spent before and after the visit in records review, medical decision making, counseling pertinent to today's visit, and charting.   Options for treating anxiety were discussed. He is responding to the gabapentin but not as much to the hydroxyazine. Rec increasing the dose. Do not drive until he takes it at home, to see how he's responding to it.  Smoking cessation  discussed. Sleep hygiene discussed.  Continue Abilify 20 mg, 1 p.o. daily. Continue Cymbalta 30 mg, 3 p.o. daily. Continue gabapentin 800 mg, 1 p.o. 3 times daily. Increase hydroxyzine to 50 mg, 1 p.o. 3 times daily as needed anxiety or sleep.   Continue trazodone 300 mg, 1 p.o. nightly as needed. Continue Ingrezza 80 mg daily.  Strongly recommend counseling. Return in 6 weeks.   Melony Overly, PA-C

## 2022-09-17 ENCOUNTER — Ambulatory Visit: Payer: Medicare HMO | Attending: Physician Assistant

## 2022-09-17 DIAGNOSIS — E785 Hyperlipidemia, unspecified: Secondary | ICD-10-CM | POA: Diagnosis not present

## 2022-09-17 DIAGNOSIS — Z79899 Other long term (current) drug therapy: Secondary | ICD-10-CM

## 2022-09-17 DIAGNOSIS — E782 Mixed hyperlipidemia: Secondary | ICD-10-CM

## 2022-09-17 DIAGNOSIS — I2510 Atherosclerotic heart disease of native coronary artery without angina pectoris: Secondary | ICD-10-CM | POA: Diagnosis not present

## 2022-09-18 LAB — HEPATIC FUNCTION PANEL
ALT: 50 IU/L — ABNORMAL HIGH (ref 0–44)
AST: 26 IU/L (ref 0–40)
Albumin: 4.6 g/dL (ref 4.1–5.1)
Alkaline Phosphatase: 84 IU/L (ref 44–121)
Bilirubin Total: 0.2 mg/dL (ref 0.0–1.2)
Bilirubin, Direct: <0.1 mg/dL (ref 0.00–0.40)
Total Protein: 7.1 g/dL (ref 6.0–8.5)

## 2022-09-18 LAB — LIPID PANEL
Chol/HDL Ratio: 6.2 ratio — ABNORMAL HIGH (ref 0.0–5.0)
Cholesterol, Total: 149 mg/dL (ref 100–199)
HDL: 24 mg/dL — ABNORMAL LOW (ref >39)
LDL Chol Calc (NIH): 76 mg/dL (ref 0–99)
Triglycerides: 301 mg/dL — ABNORMAL HIGH (ref 0–149)
VLDL Cholesterol Cal: 49 mg/dL — ABNORMAL HIGH (ref 5–40)

## 2022-09-20 ENCOUNTER — Telehealth: Payer: Self-pay

## 2022-09-20 DIAGNOSIS — Z79899 Other long term (current) drug therapy: Secondary | ICD-10-CM

## 2022-09-20 DIAGNOSIS — E782 Mixed hyperlipidemia: Secondary | ICD-10-CM

## 2022-09-20 NOTE — Telephone Encounter (Signed)
Referral has been placed. 

## 2022-09-20 NOTE — Telephone Encounter (Signed)
-----   Message from Sharlene Dory, [place] sent at 09/19/2022 12:22 PM EDT ----- He will probably be able to get in quicker with PharmD than Dr. Rennis Golden. Lets plan to do that.  Thanks! Sharlene Dory, PA-C  ----- Message ----- From: Valrie Hart, CMA Sent: 09/18/2022   2:58 PM EDT To: Sharlene Dory, PA-C  Spoke with patient and provided him with results/recommendations to which he states his understanding. He agrees to referral and he is aware that he will receive a call to schedule the appointment. Julian Hy are you referring to advanced lipid clinic or to pharmacist to discuss lipids?

## 2022-09-21 ENCOUNTER — Ambulatory Visit (INDEPENDENT_AMBULATORY_CARE_PROVIDER_SITE_OTHER): Payer: Medicare HMO | Admitting: Internal Medicine

## 2022-09-21 ENCOUNTER — Encounter: Payer: Self-pay | Admitting: Internal Medicine

## 2022-09-21 ENCOUNTER — Other Ambulatory Visit: Payer: Self-pay | Admitting: Physician Assistant

## 2022-09-21 VITALS — BP 124/80 | HR 74 | Ht 75.0 in | Wt 273.0 lb

## 2022-09-21 DIAGNOSIS — Z794 Long term (current) use of insulin: Secondary | ICD-10-CM

## 2022-09-21 DIAGNOSIS — E1165 Type 2 diabetes mellitus with hyperglycemia: Secondary | ICD-10-CM

## 2022-09-21 DIAGNOSIS — E1142 Type 2 diabetes mellitus with diabetic polyneuropathy: Secondary | ICD-10-CM | POA: Diagnosis not present

## 2022-09-21 DIAGNOSIS — Z7984 Long term (current) use of oral hypoglycemic drugs: Secondary | ICD-10-CM | POA: Diagnosis not present

## 2022-09-21 DIAGNOSIS — F250 Schizoaffective disorder, bipolar type: Secondary | ICD-10-CM

## 2022-09-21 LAB — POCT GLYCOSYLATED HEMOGLOBIN (HGB A1C): Hemoglobin A1C: 7.6 % — AB (ref 4.0–5.6)

## 2022-09-21 MED ORDER — EMPAGLIFLOZIN 25 MG PO TABS: 25.0000 mg | ORAL_TABLET | Freq: Every morning | ORAL | 3 refills | Status: DC

## 2022-09-21 MED ORDER — LANTUS SOLOSTAR 100 UNIT/ML ~~LOC~~ SOPN: 28.0000 [IU] | PEN_INJECTOR | Freq: Every day | SUBCUTANEOUS | 3 refills | Status: DC

## 2022-09-21 MED ORDER — METFORMIN HCL 1000 MG PO TABS: 1000.0000 mg | ORAL_TABLET | Freq: Two times a day (BID) | ORAL | 3 refills | Status: DC

## 2022-09-21 MED ORDER — INSULIN PEN NEEDLE 32G X 4 MM MISC
1.0000 | Freq: Every day | 3 refills | Status: DC
Start: 2022-09-21 — End: 2023-07-04

## 2022-09-21 NOTE — Addendum Note (Signed)
Addended by: Cleda Mccreedy F on: 09/21/2022 04:36 PM   Modules accepted: Orders

## 2022-09-21 NOTE — Progress Notes (Signed)
Name: Matthew [name]  Age/ Sex: 47 y.o., male   MRN/ DOB: [id number], November 29, 1975     PCP: [name], MD   Reason for Endocrinology Evaluation: Type 2 Diabetes Mellitus  Initial Endocrine Consultative Visit: 08/15/2020    PATIENT IDENTIFIER: Matthew [name] is a 47 y.o. male with a past medical history of T2DN, CAD , OSA and schizoaffective d/o. The patient has followed with Endocrinology clinic since 08/15/2020 for consultative assistance with management of his diabetes.  DIABETIC HISTORY:  Matthew [name] was diagnosed with DM 2021,he has been on metformin and jardiance . His hemoglobin A1c has ranged from 6.4% in 2020, peaking at 12.8% in 2022.  Lives alone    Pt on disability for mental health  SUBJECTIVE:   During the last visit (03/19/2022): A1c 8.8 %  Today (09/21/2022): Matthew [name] is here for a diabetes management.  He checks his blood sugars multiple  times daily, through CGM.   Denies nausea, or vomiting  Minimal tingling of the feet  Denies constipation or diarrhea   HOME DIABETES REGIMEN:  Metformin 1000 mg , 1 tablet before Breakfast and 1 tablet before Supper Jardiance 25 mg, 1 tablet before Breakfast Lantus  25 units daily      Statin: yes ACE-I/ARB: yes   CONTINUOUS GLUCOSE MONITORING RECORD INTERPRETATION    Dates of Recording: 4/20-09/21/2022  Sensor description:dexcom  Results statistics:   CGM use % of time 93  Average and SD 170/36  Time in range      68  %  % Time Above 180 29  % Time above 250 3  % Time Below target 0   Glycemic patterns summary: BG's trend down just below 180 mg/dL but remain at the upper limit with BG's fluctuating during the day   Hyperglycemic episodes  post supper   Hypoglycemic episodes occurred n/a  Overnight periods: upper limit of normal   DIABETIC COMPLICATIONS: Microvascular complications:  Neuropathy Denies: CKD, retinopathy Last Eye Exam: schedule in 2 months   Macrovascular complications:   CAD Denies: CVA, PVD   HISTORY:  Past Medical History:  Past Medical History:  Diagnosis Date   Anxiety    Arthritis    Celiac disease    Coronary artery disease    Dairy allergy    Depression    Diabetes mellitus without complication (HCC)    High cholesterol    Hypertension    Hypothyroidism    Myocardial infarction (HCC)    Paranoia (HCC)    Schizoaffective disorder (HCC)    Past Surgical History:  Past Surgical History:  Procedure Laterality Date   APPENDECTOMY     CARDIAC CATHETERIZATION     CORONARY STENT INTERVENTION N/A 06/06/2020   Procedure: CORONARY STENT INTERVENTION;  Surgeon: Swaziland, Peter M, MD;  Location: [facility] INVASIVE CV LAB;  Service: Cardiovascular;  Laterality: N/A;  prox LAD, distal RCA   CORONARY ULTRASOUND/IVUS N/A 06/06/2020   Procedure: Intravascular Ultrasound/IVUS;  Surgeon: Swaziland, Peter M, MD;  Location: [facility] INVASIVE CV LAB;  Service: Cardiovascular;  Laterality: N/A;   LEFT HEART CATH AND CORONARY ANGIOGRAPHY N/A 06/06/2020   Procedure: LEFT HEART CATH AND CORONARY ANGIOGRAPHY;  Surgeon: Swaziland, Peter M, MD;  Location: [facility] INVASIVE CV LAB;  Service: Cardiovascular;  Laterality: N/A;   LEFT HEART CATH AND CORONARY ANGIOGRAPHY N/A 02/24/2021   Procedure: LEFT HEART CATH AND CORONARY ANGIOGRAPHY;  Surgeon: Lyn Records, MD;  Location: [facility] INVASIVE CV LAB;  Service: Cardiovascular;  Laterality: N/A;  NASAL SEPTUM SURGERY     TOTAL SHOULDER ARTHROPLASTY Right 11/28/2021   Procedure: TOTAL SHOULDER ARTHROPLASTY;  Surgeon: Teryl Lucy, MD;  Location: [facility] ORS;  Service: Orthopedics;  Laterality: Right;   WRIST FRACTURE SURGERY     right wrist   Social History:  reports that he has been smoking cigarettes. He has been smoking an average of 1 pack per day. He has never used smokeless tobacco. He reports that he does not drink alcohol and does not use drugs. Family History:  Family History  Problem Relation Age of Onset   Asthma Mother    Hypertension  Mother    Hypertension Father      HOME MEDICATIONS: Allergies as of 09/21/2022       Reactions   Gluten Meal Other (See Comments)   Celiac disease   Lactose Intolerance (gi) Diarrhea   Can tolerate hard cheese        Medication List        Accurate as of Sep 21, 2022 11:52 AM. If you have any questions, ask your nurse or doctor.          STOP taking these medications    insulin glargine-yfgn 100 UNIT/ML injection Commonly known as: SEMGLEE Stopped by: Scarlette Shorts, MD       TAKE these medications    ARIPiprazole 20 MG tablet Commonly known as: ABILIFY TAKE 1 TABLET BY MOUTH EVERYDAY AT BEDTIME   aspirin EC 81 MG tablet Take 1 tablet (81 mg total) by mouth daily. Swallow whole.   atorvastatin 80 MG tablet Commonly known as: LIPITOR Take 80 mg by mouth daily.   carvedilol 12.5 MG tablet Commonly known as: COREG Take 3 tablets (37.5 mg total) by mouth 2 (two) times daily.   clopidogrel 75 MG tablet Commonly known as: PLAVIX Take 1 tablet (75 mg total) by mouth daily.   DULoxetine 30 MG capsule Commonly known as: CYMBALTA TAKE 3 CAPSULES BY MOUTH EVERY DAY   empagliflozin 25 MG Tabs tablet Commonly known as: Jardiance Take 1 tablet (25 mg total) by mouth every morning.   fenofibrate 160 MG tablet Take 1 tablet (160 mg total) by mouth daily.   gabapentin 800 MG tablet Commonly known as: NEURONTIN Take 1 tablet (800 mg total) by mouth 3 (three) times daily.   hydrOXYzine 50 MG tablet Commonly known as: ATARAX Take 1 tablet (50 mg total) by mouth 3 (three) times daily as needed.   Insulin Pen Needle 32G X 4 MM Misc 1 Device by Does not apply route daily in the afternoon. Started by: Scarlette Shorts, MD   Lantus SoloStar 100 UNIT/ML Solostar Pen Generic drug: insulin glargine Inject 28 Units into the skin daily. What changed:  medication strength how much to take Changed by: Scarlette Shorts, MD   levothyroxine 25 MCG  tablet Commonly known as: SYNTHROID Take 1 tablet (25 mcg total) by mouth in the morning.   metFORMIN 1000 MG tablet Commonly known as: GLUCOPHAGE Take 1 tablet (1,000 mg total) by mouth 2 (two) times daily with a meal.   nicotine polacrilex 2 MG gum Commonly known as: NICORETTE Take 1 each (2 mg total) by mouth as needed for smoking cessation.   trazodone 300 MG tablet Commonly known as: DESYREL Take 300 mg by mouth at bedtime.   valbenazine 80 MG capsule Commonly known as: INGREZZA Take 1 capsule (80 mg total) by mouth daily.   Vascepa 1 g capsule Generic drug: icosapent Ethyl Take 2  g by mouth 2 (two) times daily.         OBJECTIVE:   Vital Signs: BP 124/80 (BP Location: Left Arm, Patient Position: Sitting, Cuff Size: Large)   Pulse 74   Ht 6\' 3"  (1.905 m)   Wt 273 lb (123.8 kg)   SpO2 96%   BMI 34.12 kg/m   Wt Readings from Last 3 Encounters:  09/21/22 273 lb (123.8 kg)  07/18/22 273 lb 9.6 oz (124.1 kg)  06/30/22 240 lb (108.9 kg)     Exam: General: Pt appears well and is in NAD  Lungs: Clear with good BS bilat   Heart: RRR   Abdomen: soft, nontender  Extremities: No pretibial edema.   Neuro: MS is good with appropriate affect, pt is alert and Ox3    DM foot exam: 09/21/2022 The skin of the feet is without sores or ulcerations, callous formation at the heels The pedal pulses are undetectable  The sensation is intact to a screening 5.07, 10 gram monofilament bilaterally       DATA REVIEWED:  Lab Results  Component Value Date   HGBA1C 7.6 (A) 09/21/2022   HGBA1C 8.3 (H) 06/30/2022   HGBA1C 8.8 (A) 03/19/2022     Latest Reference Range & Units 09/17/22 09:21  Alkaline Phosphatase 44 - 121 IU/L 84  Albumin 4.1 - 5.1 g/dL 4.6  AST 0 - 40 IU/L 26  ALT 0 - 44 IU/L 50 (H)  Total Protein 6.0 - 8.5 g/dL 7.1  Total Bilirubin 0.0 - 1.2 mg/dL 0.2  BILIRUBIN, DIRECT 0.00 - 0.40 mg/dL <8.11  (H): Data is abnormally high   ASSESSMENT / PLAN /  RECOMMENDATIONS:   1) Type 2 Diabetes Mellitus, Sub-optimally controlled, With neuropathic  complications - Most recent A1c of 7.6 %. Goal A1c < 7.0 %.     - A1c is trending down but continues to be above goal -His fasting BG's continue to be above goal, will increase insulin as below -MA/CR ratio is pending -BMP and lipid panel up-to-date  MEDICATIONS: Continue Metformin 1000 mg , 1 tablet before Breakfast and 1 tablet before Supper  Continue  Jardiance 25 mg, 1 tablet before Breakfast  INcrease Lantus  28  units once daily   EDUCATION / INSTRUCTIONS: BG monitoring instructions: Patient is instructed to check his blood sugars 1 times a day. Call [facility] if: BG persistently < 70  I reviewed the Rule of 15 for the treatment of hypoglycemia in detail with the patient. Literature supplied.   2) Diabetic complications:  Eye: Does not have known diabetic retinopathy.  Neuro/ Feet: Does not have known diabetic peripheral neuropathy .  Renal: Patient does not have known baseline CKD. He   is  on an ACEI/ARB at present.   3) Hypertriglyceridemia   - Per cardiology      F/U in 6 months     Signed electronically by: Lyndle Herrlich, MD  [facility]  [facility] [address] [place] [place], [place] [address] Phone: [phone] FAX: [phone]   CC: [name], MD [address] [place] [address] Phone: [phone]  Fax: [phone]  Return to Endocrinology clinic as below: Future Appointments  Date Time Provider Department Center  10/19/2022 11:00 AM Cherie Ouch, PA-C [facility] None  02/08/2023 10:40 AM Meriam Sprague, MD [facility]

## 2022-09-21 NOTE — Patient Instructions (Addendum)
-   Continue Metformin 1000 mg , 1 tablet before Breakfast and 1 tablet before Supper  - Continue  Jardiance 25 mg, 1 tablet before Breakfast  - Increase  Lantus 28  units once daily      HOW TO TREAT LOW BLOOD SUGARS (Blood sugar LESS THAN 70 MG/DL) Please follow the RULE OF 15 for the treatment of hypoglycemia treatment (when your (blood sugars are less than 70 mg/dL)   STEP 1: Take 15 grams of carbohydrates when your blood sugar is low, which includes:  3-4 GLUCOSE TABS  OR 3-4 OZ OF JUICE OR REGULAR SODA OR ONE TUBE OF GLUCOSE GEL    STEP 2: RECHECK blood sugar in 15 MINUTES STEP 3: If your blood sugar is still low at the 15 minute recheck --> then, go back to STEP 1 and treat AGAIN with another 15 grams of carbohydrates.

## 2022-09-22 LAB — MICROALBUMIN / CREATININE URINE RATIO
Creatinine, Urine: 46 mg/dL (ref 20–320)
Microalb Creat Ratio: 4 mg/g creat (ref <30)
Microalb, Ur: 0.2 mg/dL

## 2022-09-26 ENCOUNTER — Other Ambulatory Visit: Payer: Self-pay | Admitting: Physician Assistant

## 2022-09-28 ENCOUNTER — Other Ambulatory Visit: Payer: Self-pay | Admitting: Physician Assistant

## 2022-09-29 ENCOUNTER — Other Ambulatory Visit: Payer: Self-pay | Admitting: Physician Assistant

## 2022-10-02 ENCOUNTER — Other Ambulatory Visit: Payer: Self-pay | Admitting: *Deleted

## 2022-10-02 MED ORDER — CLOPIDOGREL BISULFATE 75 MG PO TABS: 75.0000 mg | ORAL_TABLET | Freq: Every day | ORAL | 3 refills | Status: DC

## 2022-10-10 ENCOUNTER — Telehealth: Payer: Self-pay | Admitting: Pharmacist

## 2022-10-10 ENCOUNTER — Ambulatory Visit: Payer: Medicare HMO | Attending: Cardiovascular Disease | Admitting: Pharmacist

## 2022-10-10 DIAGNOSIS — E782 Mixed hyperlipidemia: Secondary | ICD-10-CM | POA: Diagnosis not present

## 2022-10-10 MED ORDER — ICOSAPENT ETHYL 1 G PO CAPS: 2.0000 g | ORAL_CAPSULE | Freq: Two times a day (BID) | ORAL | 3 refills | Status: DC

## 2022-10-10 MED ORDER — NICOTINE POLACRILEX 4 MG MT GUM: 4.0000 mg | CHEWING_GUM | OROMUCOSAL | 1 refills | Status: DC | PRN

## 2022-10-10 MED ORDER — NEXLIZET 180-10 MG PO TABS: 1.0000 | ORAL_TABLET | Freq: Every day | ORAL | 3 refills | Status: DC

## 2022-10-10 MED ORDER — NICOTINE 21 MG/24HR TD PT24: 21.0000 mg | MEDICATED_PATCH | Freq: Every day | TRANSDERMAL | 3 refills | Status: DC

## 2022-10-10 MED ORDER — FENOFIBRATE 145 MG PO TABS: 145.0000 mg | ORAL_TABLET | Freq: Every day | ORAL | 3 refills | Status: DC

## 2022-10-10 NOTE — Telephone Encounter (Signed)
PA approved through 05/21/23. Rx sent to pharmacy, pt made aware, should be able to get 3 month rx while his Medicaid is still active. Advised pt to let me know if Vascepa and Nexlizet copays go up at time of his next fill once his Medicaid coverage ends and can pursue [facility] if needed.

## 2022-10-10 NOTE — Telephone Encounter (Signed)
Nexlizet PA submitted, Key: [id number].  Will send in 3 month rx once approved, losing Medicaid at the end of the month and will just have Medicare, will likely need [facility] in the future for Vascepa and Nexlizet.

## 2022-10-10 NOTE — Progress Notes (Signed)
Patient ID: JONG NELLS                 DOB: 04/29/1976                    MRN: [id number]     HPI: Matthew [name] is a 47 y.o. male patient of Dr Shari Prows referred to lipid clinic by Jari Favre, PA. PMH is significant for HTN, HLD, pre-DM, celiac disease, schizoaffective disorder, anxiety, ADHD, OSA not on CPAP, and  NSTEMI s/p DES to pLAD and DES to dRCA in 05/2020. Has had numerous subsequent ER visits for chest pain. Repeat cath in 02/2021 with stable disease, patent LAD stent and RCA stent. Patient has struggled with medication compliance. Saw PharmD a year ago for elevated TG of 675. Had been missing insulin injections, f/u TG 416, subsequently started on fenofibrate.   Pt presents today for follow up. Reports adherence to his medication. Still walking 2 miles daily, smoking 1 PPD. Stopped drinking V8 juice. Has been compliant with his DM meds, A1c improved recently from 8.3% to 7.6%. Using Dexcom G7 now which is helping him make healthier food choices. Reading nutrition labels. Wants to restart Adderall if possible, Strattera wasn't effective. Losing Medicaid coverage at the end of the month, will still have his Medicare. Asks if his other meds can increase his cholesterol - discussed that a few of his psych meds can.  Current Medications: atorvastatin 80mg  daily, fenofibrate 160mg  daily, Vascepa 2g BID Risk Factors: premature CAD, DM, HTN, tobacco use LDL goal: 55mg /dL  Diet: Limits carbs and alcohol. Stopped drinking V juice. Drinks 0 calorie sweet tea, Monster energy drink with 3g of sugar. Likes chicken salad with crackers, skillet meals.  Exercise: Walking 2 miles each day.  Family History: Asthma in his mother; Hypertension in his father and mother.   Social History: Resides at [employer]  Labs: 09/17/22: TC 149, TG 301, HDL 24, LDL 76 - atorvastatin 80mg  daily, fenofibrate 160mg  daily, Vascepa 2g BID 09/21/22: A1c 7.6% 06/30/22: A1c 8.3%  Past Medical History:   Diagnosis Date   Anxiety    Arthritis    Celiac disease    Coronary artery disease    Dairy allergy    Depression    Diabetes mellitus without complication (HCC)    High cholesterol    Hypertension    Hypothyroidism    Myocardial infarction (HCC)    Paranoia (HCC)    Schizoaffective disorder (HCC)     Current Outpatient Medications on File Prior to Visit  Medication Sig Dispense Refill   ARIPiprazole (ABILIFY) 20 MG tablet TAKE 1 TABLET BY MOUTH EVERYDAY AT BEDTIME 90 tablet 1   aspirin EC 81 MG tablet Take 1 tablet (81 mg total) by mouth daily. Swallow whole. 30 tablet 0   atorvastatin (LIPITOR) 80 MG tablet Take 80 mg by mouth daily.     carvedilol (COREG) 12.5 MG tablet Take 3 tablets (37.5 mg total) by mouth 2 (two) times daily. 180 tablet 0   clopidogrel (PLAVIX) 75 MG tablet Take 1 tablet (75 mg total) by mouth daily. 90 tablet 3   DULoxetine (CYMBALTA) 30 MG capsule TAKE 3 CAPSULES BY MOUTH EVERY DAY 270 capsule 1   empagliflozin (JARDIANCE) 25 MG TABS tablet Take 1 tablet (25 mg total) by mouth every morning. 90 tablet 3   fenofibrate 160 MG tablet Take 1 tablet (160 mg total) by mouth daily. 30 tablet 0   gabapentin (NEURONTIN) 800 MG tablet  TAKE 1 TABLET BY MOUTH THREE TIMES A DAY 90 tablet 1   hydrOXYzine (ATARAX) 50 MG tablet Take 1 tablet (50 mg total) by mouth 3 (three) times daily as needed. 90 tablet 1   icosapent Ethyl (VASCEPA) 1 g capsule Take 2 g by mouth 2 (two) times daily.     insulin glargine (LANTUS SOLOSTAR) 100 UNIT/ML Solostar Pen Inject 28 Units into the skin daily. 30 mL 3   Insulin Pen Needle 32G X 4 MM MISC 1 Device by Does not apply route daily in the afternoon. 100 each 3   levothyroxine (SYNTHROID) 25 MCG tablet Take 1 tablet (25 mcg total) by mouth in the morning. 30 tablet 0   metFORMIN (GLUCOPHAGE) 1000 MG tablet Take 1 tablet (1,000 mg total) by mouth 2 (two) times daily with a meal. 180 tablet 3   nicotine polacrilex (NICORETTE) 2 MG gum Take  1 each (2 mg total) by mouth as needed for smoking cessation. 30 tablet 0   trazodone (DESYREL) 300 MG tablet Take 300 mg by mouth at bedtime.     valbenazine (INGREZZA) 80 MG capsule Take 1 capsule (80 mg total) by mouth daily. 30 capsule 2   No current facility-administered medications on file prior to visit.    Allergies  Allergen Reactions   Gluten Meal Other (See Comments)    Celiac disease   Lactose Intolerance (Gi) Diarrhea    Can tolerate hard cheese    Assessment/Plan:  1. Hyperlipidemia - LDL 76 on atorvastatin 80mg  daily, above goal < 55 given hx of premature CAD. TG elevated at 301 on Vascepa 2g BID and fenofibrate 145mg  daily, above goal < 150. Will start Nexlizet 180-10mg  daily. Discussed limiting carbs, sugar, and alcohol in his diet to help with TG control. His A1c is improved. Encouraged 150 minutes of activity per week. Will recheck fasting labs when he sees Dr Shari Prows in September.   Medicaid coverage ending at the end of the month - will plan to send in 3 month rx of Nexlizet once PA approved while he still has Medicaid coverage, then will likely need [facility] grant.  2. Tobacco use - Smoking 1 PPD. Would like to start using nicotine patch, rx sent in for 21mg  dose. Reports 2mg  nicotine gum hasn't been effective. Smokes his first cigarette immediately upon wakening so qualifies for higher 4mg  dose, this has been sent in as well.  3. ADHD - Pt inquires about resuming Adderall. This was stopped given his CAD. Reports he tried Strattera but it wasn't helpful. Will forward to MD for input.  Tam Savoia E. Debrina Kizer, PharmD, BCACP, CPP [facility] [address], [place], [place] [address] Phone: [phone]; Fax: [phone] 10/10/2022 2:51 PM

## 2022-10-10 NOTE — Patient Instructions (Addendum)
Your triglycerides are 301 and your goal is < 150 -Continue your Vascepa 2g twice daily and fenofibrate 145mg  daily -Limit sugar, carbs, and alcohol in your diet  Your LDL is 76 and your goal is < 55 -Contineu atorvastatin 80mg  daily -Start Nexlizet - 1 tablet daily. It will take a few days for the pharmacy to get this, I'll have to get coverage with your insurance first -Limit red meat, fried food, and saturated fats  Aim for 150 minutes of activity each day I'll look into a grant through the [facility] to help with your cholesterol meds (Vascepa and the new med Nexlizet) for when your Medicaid coverage drops  Recheck fasting labs when you see Dr Shari Prows in September

## 2022-10-11 ENCOUNTER — Telehealth: Payer: Self-pay | Admitting: Cardiology

## 2022-10-11 NOTE — Telephone Encounter (Signed)
Patient called stated years ago he had an heart attach and was advised by Dr. Shari Prows to d/c'd Adderall. Pt would like to restart the medication. Will forward to MD and nurse for advise.

## 2022-10-11 NOTE — Telephone Encounter (Signed)
Collis, Fleming - 10/11/2022  2:11 PM Meriam Sprague, MD  Sent: Thu Oct 11, 2022  3:44 PM  To: Loa Socks, LPN         Message  Spoke to [facility] and we both agree given his bad artery disease that adderrall is not a great option for him. Is he okay discussing with his psychiatrist a potential alternative?    Pt made aware of recommendations per Dr. Shari Prows and our Pharmacist Megan Supple.  Advised him to follow-up with his Psychiatrist to discuss potential alternative options and call us back if he needs [place] to advise on the safety of this from a cardiac perspective.   Pt verbalized understanding and agrees with this plan.

## 2022-10-11 NOTE — Telephone Encounter (Signed)
Pt c/o medication issue:  1. Name of Medication: Adderall  2. How are you currently taking this medication (dosage and times per day)?  Not taking it at this  time  3. Are you having a reaction (difficulty breathing--STAT)?   4. What is your medication issue? Patient wants to know if he can go back on it

## 2022-10-16 DIAGNOSIS — Z125 Encounter for screening for malignant neoplasm of prostate: Secondary | ICD-10-CM | POA: Diagnosis not present

## 2022-10-16 DIAGNOSIS — E782 Mixed hyperlipidemia: Secondary | ICD-10-CM | POA: Diagnosis not present

## 2022-10-16 DIAGNOSIS — Z79899 Other long term (current) drug therapy: Secondary | ICD-10-CM | POA: Diagnosis not present

## 2022-10-16 DIAGNOSIS — I10 Essential (primary) hypertension: Secondary | ICD-10-CM | POA: Diagnosis not present

## 2022-10-16 DIAGNOSIS — Z0001 Encounter for general adult medical examination with abnormal findings: Secondary | ICD-10-CM | POA: Diagnosis not present

## 2022-10-16 DIAGNOSIS — F17210 Nicotine dependence, cigarettes, uncomplicated: Secondary | ICD-10-CM | POA: Diagnosis not present

## 2022-10-16 DIAGNOSIS — R946 Abnormal results of thyroid function studies: Secondary | ICD-10-CM | POA: Diagnosis not present

## 2022-10-19 ENCOUNTER — Ambulatory Visit (INDEPENDENT_AMBULATORY_CARE_PROVIDER_SITE_OTHER): Payer: Medicare HMO | Admitting: Physician Assistant

## 2022-10-19 ENCOUNTER — Encounter: Payer: Self-pay | Admitting: Physician Assistant

## 2022-10-19 VITALS — BP 132/84 | HR 69

## 2022-10-19 DIAGNOSIS — F411 Generalized anxiety disorder: Secondary | ICD-10-CM

## 2022-10-19 DIAGNOSIS — F99 Mental disorder, not otherwise specified: Secondary | ICD-10-CM | POA: Diagnosis not present

## 2022-10-19 DIAGNOSIS — F5105 Insomnia due to other mental disorder: Secondary | ICD-10-CM | POA: Diagnosis not present

## 2022-10-19 DIAGNOSIS — G2401 Drug induced subacute dyskinesia: Secondary | ICD-10-CM

## 2022-10-19 DIAGNOSIS — F902 Attention-deficit hyperactivity disorder, combined type: Secondary | ICD-10-CM | POA: Diagnosis not present

## 2022-10-19 DIAGNOSIS — F17200 Nicotine dependence, unspecified, uncomplicated: Secondary | ICD-10-CM | POA: Diagnosis not present

## 2022-10-19 DIAGNOSIS — F250 Schizoaffective disorder, bipolar type: Secondary | ICD-10-CM

## 2022-10-19 MED ORDER — ATOMOXETINE HCL 100 MG PO CAPS: 100.0000 mg | ORAL_CAPSULE | Freq: Every day | ORAL | 1 refills | Status: DC

## 2022-10-19 NOTE — Progress Notes (Signed)
[facility]  Patient ID: Matthew [name],  MRN: [id number]  PCP: [name], MD  Date of Evaluation: 10/19/2022 Time spent:30 minutes  Chief Complaint:  Chief Complaint   Anxiety; Depression; Insomnia; Follow-up    HISTORY/CURRENT STATUS: HPI For routine med check.   He states that ADHD is pretty bad.  He is studying for continuing it and he has to read the same paragraph over and over until he finally understands what he is reading.  He started back on the Strattera a month or 2 ago and it is not helping.  Patient is able to enjoy things.  Energy and motivation are good.  He is disabled, due to physical and mental health reasons.  He still helps his dad take care of his mom who has dementia.  Her condition is worsening which is hard for him to watch.  No sadness without a cause, tearfulness, or feelings of hopelessness.  Sleeps well most of the time. ADLs and personal hygiene are normal.   Appetite has not changed.  Weight is stable.  Denies suicidal or homicidal thoughts.  Patient denies increased energy with decreased need for sleep, increased talkativeness, racing thoughts, impulsivity or risky behaviors, increased spending, increased libido, grandiosity, increased irritability or anger, paranoia, or hallucinations.  He still has side-to-side movements in his lower jaw but much better since being on the Ingrezza.  Denies dizziness, syncope, seizures, numbness, tingling, tremor, tics, unsteady gait, slurred speech, confusion. Denies muscle or joint pain, stiffness, or dystonia. Denies unexplained weight loss, frequent infections, or sores that heal slowly.  No polyphagia, polydipsia, or polyuria. Denies visual changes or paresthesias.   Individual Medical History/ Review of Systems: Changes? :No     Past medications for mental health diagnoses include: Risperdal, Zyprexa, Abilify, [name], Klonopin, Gabapentin, Xanax, Valium, Ativan, Hydroxyzine, Trilafon,  Atenolol, Cymbalta, Prozac, Lexapro, Paxil, Lithium, Adderall, Ritalin, Mirtazepine, Sonata was ineffective.  Austedo helped some, Strattera  Allergies: Gluten meal and Lactose intolerance (gi)  Current Medications:  Current Outpatient Medications:    ARIPiprazole (ABILIFY) 20 MG tablet, TAKE 1 TABLET BY MOUTH EVERYDAY AT BEDTIME, Disp: 90 tablet, Rfl: 1   aspirin EC 81 MG tablet, Take 1 tablet (81 mg total) by mouth daily. Swallow whole., Disp: 30 tablet, Rfl: 0   atomoxetine (STRATTERA) 100 MG capsule, Take 1 capsule (100 mg total) by mouth daily., Disp: 30 capsule, Rfl: 1   atorvastatin (LIPITOR) 80 MG tablet, Take 80 mg by mouth daily., Disp: , Rfl:    Bempedoic Acid-Ezetimibe (NEXLIZET) 180-10 MG TABS, Take 1 tablet by mouth daily., Disp: 90 tablet, Rfl: 3   carvedilol (COREG) 12.5 MG tablet, Take 3 tablets (37.5 mg total) by mouth 2 (two) times daily., Disp: 180 tablet, Rfl: 0   clopidogrel (PLAVIX) 75 MG tablet, Take 1 tablet (75 mg total) by mouth daily., Disp: 90 tablet, Rfl: 3   DULoxetine (CYMBALTA) 30 MG capsule, TAKE 3 CAPSULES BY MOUTH EVERY DAY, Disp: 270 capsule, Rfl: 1   empagliflozin (JARDIANCE) 25 MG TABS tablet, Take 1 tablet (25 mg total) by mouth every morning., Disp: 90 tablet, Rfl: 3   fenofibrate (TRICOR) 145 MG tablet, Take 1 tablet (145 mg total) by mouth daily., Disp: 90 tablet, Rfl: 3   gabapentin (NEURONTIN) 800 MG tablet, TAKE 1 TABLET BY MOUTH THREE TIMES A DAY, Disp: 90 tablet, Rfl: 1   hydrOXYzine (ATARAX) 50 MG tablet, Take 1 tablet (50 mg total) by mouth 3 (three) times daily as needed., Disp: 90 tablet,  Rfl: 1   icosapent Ethyl (VASCEPA) 1 g capsule, Take 2 capsules (2 g total) by mouth 2 (two) times daily., Disp: 360 capsule, Rfl: 3   insulin glargine (LANTUS SOLOSTAR) 100 UNIT/ML Solostar Pen, Inject 28 Units into the skin daily., Disp: 30 mL, Rfl: 3   Insulin Pen Needle 32G X 4 MM MISC, 1 Device by Does not apply route daily in the afternoon., Disp: 100  each, Rfl: 3   levothyroxine (SYNTHROID) 25 MCG tablet, Take 1 tablet (25 mcg total) by mouth in the morning., Disp: 30 tablet, Rfl: 0   metFORMIN (GLUCOPHAGE) 1000 MG tablet, Take 1 tablet (1,000 mg total) by mouth 2 (two) times daily with a meal., Disp: 180 tablet, Rfl: 3   trazodone (DESYREL) 300 MG tablet, Take 300 mg by mouth at bedtime., Disp: , Rfl:    valbenazine (INGREZZA) 80 MG capsule, Take 1 capsule (80 mg total) by mouth daily., Disp: 30 capsule, Rfl: 2   nicotine (NICODERM CQ - DOSED IN MG/24 HOURS) 21 mg/24hr patch, Place 1 patch (21 mg total) onto the skin daily. (Patient not taking: Reported on 10/19/2022), Disp: 28 patch, Rfl: 3   nicotine polacrilex ([employer] NICOTINE) 4 MG gum, Take 1 each (4 mg total) by mouth as needed for smoking cessation. (Patient not taking: Reported on 10/19/2022), Disp: 100 tablet, Rfl: 1 Medication Side Effects: none  Family Medical/ Social History: Changes? No  MENTAL HEALTH EXAM:  Blood pressure 132/84, pulse 69.There is no height or weight on file to calculate BMI.  General Appearance: Casual and Well Groomed  Eye Contact:  Good  Speech:  Clear and Coherent and Normal Rate  Volume:  Normal  Mood:  Euthymic  Affect:  Congruent  Thought Process:  Goal Directed and Descriptions of Associations: Circumstantial  Orientation:  Full (Time, Place, and Person)  Thought Content: Logical   Suicidal Thoughts:  No  Homicidal Thoughts:  No  Memory:  WNL  Judgement:  Fair  Insight:  Good  Psychomotor Activity:   occas side to side motion of mandible  Concentration:  Concentration: Fair and Attention Span: Fair  Recall:  Good  Fund of Knowledge: Good  Language: Good  Assets:  Desire for Improvement Financial Resources/Insurance Housing Transportation  ADL's:  Intact  Cognition: WNL  Prognosis:  Good   DIAGNOSES:    ICD-10-CM   1. Attention deficit hyperactivity disorder (ADHD), combined type, moderate  F90.2     2. Schizoaffective disorder,  bipolar type (HCC)  F25.0     3. Generalized anxiety disorder  F41.1     4. Insomnia due to other mental disorder  F51.05    F99     5. Tardive dyskinesia  G24.01     6. Smoker  F17.200       Receiving Psychotherapy: No   RECOMMENDATIONS:  PDMP reviewed.  Gabapentin filled 09/27/2022.  Klonopin filled 07/28/2022 by Dr. Carlyn Reichert.   I provided 30 minutes of face to face time during this encounter, including time spent before and after the visit in records review, medical decision making, counseling pertinent to today's visit, and charting.   I recommend increasing Strattera to the maximum dose.  If that is not effective consider clonidine or guanfacine.  I will discuss this with his cardiologist though before I prescribe it.  I prefer not to prescribe a stimulant.  Continue Abilify 20 mg, 1 p.o. daily. Increase Strattera to 100 mg, 1 p.o. every morning. Continue Cymbalta 30 mg, 3 p.o. daily.  Continue gabapentin 800 mg, 1 p.o. 3 times daily. Continue hydroxyzine 50 mg, 1 p.o. 3 times daily as needed anxiety or sleep.   Continue trazodone 300 mg, 1 p.o. nightly as needed. Continue Ingrezza 80 mg daily.  Return in 6 weeks.   Melony Overly, PA-C

## 2022-10-29 ENCOUNTER — Other Ambulatory Visit: Payer: Self-pay

## 2022-10-29 MED ORDER — CARVEDILOL 12.5 MG PO TABS: 37.5000 mg | ORAL_TABLET | Freq: Two times a day (BID) | ORAL | 2 refills | Status: DC

## 2022-10-31 ENCOUNTER — Other Ambulatory Visit: Payer: Self-pay | Admitting: Physician Assistant

## 2022-11-12 ENCOUNTER — Other Ambulatory Visit: Payer: Self-pay | Admitting: Physician Assistant

## 2022-11-12 NOTE — Telephone Encounter (Signed)
FU 6/28

## 2022-11-16 ENCOUNTER — Ambulatory Visit (INDEPENDENT_AMBULATORY_CARE_PROVIDER_SITE_OTHER): Payer: Medicare HMO | Admitting: Physician Assistant

## 2022-11-16 ENCOUNTER — Encounter: Payer: Self-pay | Admitting: Physician Assistant

## 2022-11-16 DIAGNOSIS — F902 Attention-deficit hyperactivity disorder, combined type: Secondary | ICD-10-CM

## 2022-11-16 DIAGNOSIS — F17200 Nicotine dependence, unspecified, uncomplicated: Secondary | ICD-10-CM

## 2022-11-16 DIAGNOSIS — G2401 Drug induced subacute dyskinesia: Secondary | ICD-10-CM

## 2022-11-16 DIAGNOSIS — F411 Generalized anxiety disorder: Secondary | ICD-10-CM

## 2022-11-16 DIAGNOSIS — F99 Mental disorder, not otherwise specified: Secondary | ICD-10-CM

## 2022-11-16 DIAGNOSIS — F5105 Insomnia due to other mental disorder: Secondary | ICD-10-CM

## 2022-11-16 DIAGNOSIS — F250 Schizoaffective disorder, bipolar type: Secondary | ICD-10-CM

## 2022-11-16 MED ORDER — HYDROXYZINE HCL 50 MG PO TABS: 50.0000 mg | ORAL_TABLET | Freq: Four times a day (QID) | ORAL | 1 refills | Status: DC | PRN

## 2022-11-16 MED ORDER — GABAPENTIN 800 MG PO TABS: 800.0000 mg | ORAL_TABLET | Freq: Four times a day (QID) | ORAL | 1 refills | Status: DC

## 2022-11-16 NOTE — Progress Notes (Signed)
[facility]  Patient ID: Matthew [name],  MRN: [id number]  PCP: [name], MD  Date of Evaluation: 11/16/2022 Time spent: 23 mins  Chief Complaint:  Chief Complaint   Anxiety; Depression; Insomnia; Follow-up    HISTORY/CURRENT STATUS: HPI For routine med check.   He is more anxious.  His mom's condition (dementia) is getting worse and that is hard to deal with.  He and his dad are the sole caregivers although they do have a CNA that comes in 3 times a week for 4 hours/day which is helpful.  [name] feels nervous most all the time, feels like something bad may happen.  Not having panic attacks but feels on edge a lot.  He does not have time to enjoy much of anything.  Energy and motivation are good.  He is unable to work due to both physical and mental health issues.  No extreme sadness, tearfulness, or feelings of hopelessness.  He has trouble sleeping sometimes.  Hydroxyzine is helpful most of the time.  ADLs and personal hygiene are normal. Appetite has not changed.  Weight is stable.  Denies suicidal or homicidal thoughts.  Increasing the Strattera at her last visit was slightly beneficial.  He would prefer to be able to focus and concentrate even more, but states the Wilhemena Durie is working some at least.  Patient denies increased energy with decreased need for sleep, increased talkativeness, racing thoughts, impulsivity or risky behaviors, increased spending, increased libido, grandiosity, increased irritability or anger, paranoia, or hallucinations.  Movement in his jaw are much better since being on the Ingrezza.  Denies dizziness, syncope, seizures, numbness, tingling, tremor, tics, unsteady gait, slurred speech, confusion. Denies muscle or joint pain, stiffness, or dystonia. Denies unexplained weight loss, frequent infections, or sores that heal slowly.  No polyphagia, polydipsia, or polyuria. Denies visual changes or paresthesias.   Individual Medical  History/ Review of Systems: Changes? :No     Past medications for mental health diagnoses include: Risperdal, Zyprexa, Abilify, [name], Klonopin, Gabapentin, Xanax, Valium, Ativan, Hydroxyzine, Trilafon, Atenolol, Cymbalta, Prozac, Lexapro, Paxil, Lithium, Adderall, Ritalin, Mirtazepine, Sonata was ineffective.  Austedo helped some, Strattera  Allergies: Gluten meal and Lactose intolerance (gi)  Current Medications:  Current Outpatient Medications:    ARIPiprazole (ABILIFY) 20 MG tablet, TAKE 1 TABLET BY MOUTH EVERYDAY AT BEDTIME, Disp: 90 tablet, Rfl: 1   aspirin EC 81 MG tablet, Take 1 tablet (81 mg total) by mouth daily. Swallow whole., Disp: 30 tablet, Rfl: 0   atomoxetine (STRATTERA) 100 MG capsule, Take 1 capsule (100 mg total) by mouth daily., Disp: 30 capsule, Rfl: 1   atorvastatin (LIPITOR) 80 MG tablet, Take 80 mg by mouth daily., Disp: , Rfl:    Bempedoic Acid-Ezetimibe (NEXLIZET) 180-10 MG TABS, Take 1 tablet by mouth daily., Disp: 90 tablet, Rfl: 3   carvedilol (COREG) 12.5 MG tablet, Take 3 tablets (37.5 mg total) by mouth 2 (two) times daily., Disp: 540 tablet, Rfl: 2   clopidogrel (PLAVIX) 75 MG tablet, Take 1 tablet (75 mg total) by mouth daily., Disp: 90 tablet, Rfl: 3   DULoxetine (CYMBALTA) 30 MG capsule, TAKE 3 CAPSULES BY MOUTH EVERY DAY, Disp: 270 capsule, Rfl: 1   empagliflozin (JARDIANCE) 25 MG TABS tablet, Take 1 tablet (25 mg total) by mouth every morning., Disp: 90 tablet, Rfl: 3   fenofibrate (TRICOR) 145 MG tablet, Take 1 tablet (145 mg total) by mouth daily., Disp: 90 tablet, Rfl: 3   icosapent Ethyl (VASCEPA) 1 g capsule, Take  2 capsules (2 g total) by mouth 2 (two) times daily., Disp: 360 capsule, Rfl: 3   insulin glargine (LANTUS SOLOSTAR) 100 UNIT/ML Solostar Pen, Inject 28 Units into the skin daily., Disp: 30 mL, Rfl: 3   Insulin Pen Needle 32G X 4 MM MISC, 1 Device by Does not apply route daily in the afternoon., Disp: 100 each, Rfl: 3   levothyroxine  (SYNTHROID) 25 MCG tablet, Take 1 tablet (25 mcg total) by mouth in the morning., Disp: 30 tablet, Rfl: 0   metFORMIN (GLUCOPHAGE) 1000 MG tablet, Take 1 tablet (1,000 mg total) by mouth 2 (two) times daily with a meal., Disp: 180 tablet, Rfl: 3   trazodone (DESYREL) 300 MG tablet, Take 300 mg by mouth at bedtime., Disp: , Rfl:    valbenazine (INGREZZA) 80 MG capsule, Take 1 capsule (80 mg total) by mouth daily., Disp: 30 capsule, Rfl: 2   gabapentin (NEURONTIN) 800 MG tablet, Take 1 tablet (800 mg total) by mouth 4 (four) times daily., Disp: 120 tablet, Rfl: 1   hydrOXYzine (ATARAX) 50 MG tablet, Take 1 tablet (50 mg total) by mouth every 6 (six) hours as needed., Disp: 120 tablet, Rfl: 1   nicotine (NICODERM CQ - DOSED IN MG/24 HOURS) 21 mg/24hr patch, Place 1 patch (21 mg total) onto the skin daily. (Patient not taking: Reported on 10/19/2022), Disp: 28 patch, Rfl: 3   nicotine polacrilex ([employer] NICOTINE) 4 MG gum, Take 1 each (4 mg total) by mouth as needed for smoking cessation. (Patient not taking: Reported on 10/19/2022), Disp: 100 tablet, Rfl: 1 Medication Side Effects: none  Family Medical/ Social History: Changes? No  MENTAL HEALTH EXAM:  There were no vitals taken for this visit.There is no height or weight on file to calculate BMI.  General Appearance: Casual and Well Groomed  Eye Contact:  Good  Speech:  Clear and Coherent and Normal Rate  Volume:  Normal  Mood:  Euthymic  Affect:  Congruent  Thought Process:  Goal Directed and Descriptions of Associations: Circumstantial  Orientation:  Full (Time, Place, and Person)  Thought Content: Logical   Suicidal Thoughts:  No  Homicidal Thoughts:  No  Memory:  WNL  Judgement:  Fair  Insight:  Good  Psychomotor Activity:  Normal  Concentration:  Concentration: Fair and Attention Span: Fair  Recall:  Good  Fund of Knowledge: Good  Language: Good  Assets:  Desire for Improvement Financial Resources/Insurance Housing Transportation   ADL's:  Intact  Cognition: WNL  Prognosis:  Good   DIAGNOSES:    ICD-10-CM   1. Generalized anxiety disorder  F41.1     2. Schizoaffective disorder, bipolar type (HCC)  F25.0     3. Attention deficit hyperactivity disorder (ADHD), combined type, moderate  F90.2     4. Insomnia due to other mental disorder  F51.05    F99     5. Tardive dyskinesia  G24.01     6. Smoker  F17.200      Receiving Psychotherapy: No   RECOMMENDATIONS:  PDMP reviewed.  Gabapentin filled 10/25/2022.  Klonopin filled 07/28/2022 by Dr. Carlyn Reichert.   I provided 23 minutes of face to face time during this encounter, including time spent before and after the visit in records review, medical decision making, counseling pertinent to today's visit, and charting.   The anxiety is not well-controlled.  He is on a high dose of gabapentin but sometimes it is necessary to go up to 3600 mg daily.  I recommend  increasing the gabapentin and also the frequency of hydroxyzine as needed.  He would like to make those changes.  Recommend no caffeine.  Exercise can be beneficial to help prevent the anxiety.  He verbalizes understanding. Smoking cessation discussed.  Continue Abilify 20 mg, 1 p.o. daily. Continue Strattera 100 mg, 1 p.o. every morning. Continue Cymbalta 30 mg, 3 p.o. daily. Increase gabapentin 800 mg to 1 p.o. 3 times daily with 1/2 pill mid day for 1 week and then 1 p.o. 4 times daily. Continue hydroxyzine 50 mg, 1 p.o. 4 times daily as needed.   Continue trazodone 300 mg, 1 p.o. nightly as needed. Continue Ingrezza 80 mg daily.  Return in 6 weeks.   Melony Overly, PA-C

## 2022-11-19 ENCOUNTER — Telehealth: Payer: Self-pay | Admitting: Physician Assistant

## 2022-11-19 MED ORDER — VALBENAZINE TOSYLATE 80 MG PO CAPS: 80.0000 mg | ORAL_CAPSULE | Freq: Every day | ORAL | 1 refills | Status: DC

## 2022-11-19 NOTE — Telephone Encounter (Signed)
Pharmacy sent RF Request for Ingrezza 80mg  , [employer]

## 2022-11-19 NOTE — Telephone Encounter (Signed)
Sent!

## 2022-11-29 ENCOUNTER — Other Ambulatory Visit: Payer: Self-pay

## 2022-11-29 MED ORDER — ATORVASTATIN CALCIUM 80 MG PO TABS: 80.0000 mg | ORAL_TABLET | Freq: Every day | ORAL | 2 refills | Status: DC

## 2022-12-22 ENCOUNTER — Other Ambulatory Visit: Payer: Self-pay | Admitting: Physician Assistant

## 2022-12-26 ENCOUNTER — Other Ambulatory Visit: Payer: Self-pay | Admitting: Physician Assistant

## 2023-01-05 ENCOUNTER — Other Ambulatory Visit: Payer: Self-pay | Admitting: Physician Assistant

## 2023-01-06 NOTE — Telephone Encounter (Signed)
Has appt. tomorrow

## 2023-01-07 ENCOUNTER — Encounter: Payer: Self-pay | Admitting: Physician Assistant

## 2023-01-07 ENCOUNTER — Ambulatory Visit: Payer: Medicare HMO | Admitting: Physician Assistant

## 2023-01-07 DIAGNOSIS — F5105 Insomnia due to other mental disorder: Secondary | ICD-10-CM | POA: Diagnosis not present

## 2023-01-07 DIAGNOSIS — G2401 Drug induced subacute dyskinesia: Secondary | ICD-10-CM

## 2023-01-07 DIAGNOSIS — F902 Attention-deficit hyperactivity disorder, combined type: Secondary | ICD-10-CM

## 2023-01-07 DIAGNOSIS — F250 Schizoaffective disorder, bipolar type: Secondary | ICD-10-CM

## 2023-01-07 DIAGNOSIS — F411 Generalized anxiety disorder: Secondary | ICD-10-CM | POA: Diagnosis not present

## 2023-01-07 DIAGNOSIS — F99 Mental disorder, not otherwise specified: Secondary | ICD-10-CM

## 2023-01-07 DIAGNOSIS — F17200 Nicotine dependence, unspecified, uncomplicated: Secondary | ICD-10-CM

## 2023-01-07 DIAGNOSIS — G894 Chronic pain syndrome: Secondary | ICD-10-CM

## 2023-01-07 DIAGNOSIS — Z6379 Other stressful life events affecting family and household: Secondary | ICD-10-CM

## 2023-01-07 MED ORDER — DULOXETINE HCL 60 MG PO CPEP: 120.0000 mg | ORAL_CAPSULE | Freq: Every day | ORAL | 1 refills | Status: DC

## 2023-01-07 MED ORDER — GABAPENTIN 800 MG PO TABS: ORAL_TABLET | ORAL | 1 refills | Status: DC

## 2023-01-07 NOTE — Progress Notes (Signed)
[facility]  Patient ID: Matthew [name],  MRN: [id number]  PCP: [name], MD  Date of Evaluation: 01/07/2023 Time spent:30 minutes  Chief Complaint:  Chief Complaint   Anxiety; Insomnia; Follow-up    HISTORY/CURRENT STATUS: HPI For routine med check.   He has been much more anxious lately.  A lot of things going on in his life.  He is in physical therapy for his back.  He does not have a car so it is hard for him to get there.  His mom's condition is worsening.  She has dementia.  His dad is trying to get her into an assisted living home.  He asks if the gabapentin can be increased.  He is not having panic attacks, more so of a state of constant uneasiness, like something bad is going to happen at any time.  He gets sweaty palms when extremely anxious.  He is avoiding caffeine, but not exercising or doing anything else that might help with the anxiety.  He is not sleeping well but states nothing helps with that.  He has tried multiple drugs and changed his habits and nothing works.  States he has come to accept it.  Patient is able to enjoy things when he has the time and money which is not often.  Energy and motivation are good.  He is still unable to work due to mental and physical health issues.  No extreme sadness, tearfulness, or feelings of hopelessness. ADLs and personal hygiene are normal.   Denies any changes in concentration, making decisions, or remembering things.  Appetite has not changed.  Weight is stable.  Denies suicidal or homicidal thoughts.  Patient denies increased energy with decreased need for sleep, increased talkativeness, racing thoughts, impulsivity or risky behaviors, increased spending, increased libido, grandiosity, increased irritability or anger, paranoia, or hallucinations.  Movement in his jaw are much better since being on the Ingrezza.  Denies dizziness, syncope, seizures, numbness, tingling, tremor, tics, unsteady gait, slurred  speech, confusion. Denies muscle or joint pain, stiffness, or dystonia. Denies unexplained weight loss, frequent infections, or sores that heal slowly.  No polyphagia, polydipsia, or polyuria. Denies visual changes or paresthesias.   Individual Medical History/ Review of Systems: Changes? :Yes  in PT for his back.     Past medications for mental health diagnoses include: Risperdal, Zyprexa, Abilify, [name], Klonopin, Gabapentin, Xanax, Valium, Ativan, Hydroxyzine, Trilafon, Atenolol, Cymbalta, Prozac, Lexapro, Paxil, Lithium, Adderall, Ritalin, Mirtazepine, Sonata was ineffective.  Austedo helped some, Strattera  Allergies: Gluten meal and Lactose intolerance (gi)  Current Medications:  Current Outpatient Medications:    ARIPiprazole (ABILIFY) 20 MG tablet, TAKE 1 TABLET BY MOUTH EVERYDAY AT BEDTIME, Disp: 90 tablet, Rfl: 1   aspirin EC 81 MG tablet, Take 1 tablet (81 mg total) by mouth daily. Swallow whole., Disp: 30 tablet, Rfl: 0   atomoxetine (STRATTERA) 100 MG capsule, TAKE 1 CAPSULE BY MOUTH EVERY DAY, Disp: 30 capsule, Rfl: 1   atorvastatin (LIPITOR) 80 MG tablet, Take 1 tablet (80 mg total) by mouth daily., Disp: 90 tablet, Rfl: 2   Bempedoic Acid-Ezetimibe (NEXLIZET) 180-10 MG TABS, Take 1 tablet by mouth daily., Disp: 90 tablet, Rfl: 3   carvedilol (COREG) 12.5 MG tablet, Take 3 tablets (37.5 mg total) by mouth 2 (two) times daily., Disp: 540 tablet, Rfl: 2   clopidogrel (PLAVIX) 75 MG tablet, Take 1 tablet (75 mg total) by mouth daily., Disp: 90 tablet, Rfl: 3   DULoxetine (CYMBALTA) 60 MG capsule, Take  2 capsules (120 mg total) by mouth daily., Disp: 60 capsule, Rfl: 1   empagliflozin (JARDIANCE) 25 MG TABS tablet, Take 1 tablet (25 mg total) by mouth every morning., Disp: 90 tablet, Rfl: 3   fenofibrate (TRICOR) 145 MG tablet, Take 1 tablet (145 mg total) by mouth daily., Disp: 90 tablet, Rfl: 3   hydrOXYzine (ATARAX) 50 MG tablet, TAKE 1 TABLET BY MOUTH EVERY 6 HOURS AS NEEDED.,  Disp: 360 tablet, Rfl: 1   icosapent Ethyl (VASCEPA) 1 g capsule, Take 2 capsules (2 g total) by mouth 2 (two) times daily., Disp: 360 capsule, Rfl: 3   insulin glargine (LANTUS SOLOSTAR) 100 UNIT/ML Solostar Pen, Inject 28 Units into the skin daily., Disp: 30 mL, Rfl: 3   Insulin Pen Needle 32G X 4 MM MISC, 1 Device by Does not apply route daily in the afternoon., Disp: 100 each, Rfl: 3   metFORMIN (GLUCOPHAGE) 1000 MG tablet, Take 1 tablet (1,000 mg total) by mouth 2 (two) times daily with a meal., Disp: 180 tablet, Rfl: 3   trazodone (DESYREL) 300 MG tablet, Take 300 mg by mouth at bedtime., Disp: , Rfl:    valbenazine (INGREZZA) 80 MG capsule, Take 1 capsule (80 mg total) by mouth daily., Disp: 30 capsule, Rfl: 1   gabapentin (NEURONTIN) 800 MG tablet, One po qid w/ an extra 1/2 pill during the day prn., Disp: 135 tablet, Rfl: 1   levothyroxine (SYNTHROID) 25 MCG tablet, Take 1 tablet (25 mcg total) by mouth in the morning. (Patient not taking: Reported on 01/07/2023), Disp: 30 tablet, Rfl: 0   nicotine (NICODERM CQ - DOSED IN MG/24 HOURS) 21 mg/24hr patch, Place 1 patch (21 mg total) onto the skin daily. (Patient not taking: Reported on 10/19/2022), Disp: 28 patch, Rfl: 3   nicotine polacrilex ([employer] NICOTINE) 4 MG gum, Take 1 each (4 mg total) by mouth as needed for smoking cessation. (Patient not taking: Reported on 10/19/2022), Disp: 100 tablet, Rfl: 1 Medication Side Effects: none  Family Medical/ Social History: Changes? No  MENTAL HEALTH EXAM:  There were no vitals taken for this visit.There is no height or weight on file to calculate BMI.  General Appearance: Casual and Well Groomed  Eye Contact:  Good  Speech:  Clear and Coherent and Normal Rate  Volume:  Normal  Mood:  Anxious  Affect:  Anxious  Thought Process:  Goal Directed and Descriptions of Associations: Circumstantial  Orientation:  Full (Time, Place, and Person)  Thought Content: Logical   Suicidal Thoughts:  No   Homicidal Thoughts:  No  Memory:  WNL  Judgement:  Fair  Insight:  Good  Psychomotor Activity:  Normal  Concentration:  Concentration: Good and Attention Span: Fair  Recall:  Good  Fund of Knowledge: Good  Language: Good  Assets:  Desire for Improvement Financial Resources/Insurance Housing Transportation  ADL's:  Intact  Cognition: WNL  Prognosis:  Good   DIAGNOSES:    ICD-10-CM   1. Generalized anxiety disorder  F41.1     2. Schizoaffective disorder, bipolar type (HCC)  F25.0     3. Insomnia due to other mental disorder  F51.05    F99     4. Tardive dyskinesia  G24.01     5. Attention deficit hyperactivity disorder (ADHD), combined type, moderate  F90.2     6. Smoker  F17.200     7. Stress due to illness of family member  Z63.79     8. Chronic pain syndrome  G89.4  Receiving Psychotherapy: No   RECOMMENDATIONS:  PDMP reviewed.  Gabapentin filled 12/15/2022.  Also on tramadol.  Klonopin filled 07/28/2022 by Dr. Carlyn Reichert.   I provided 30 minutes of face to face time during this encounter, including time spent before and after the visit in records review, medical decision making, counseling pertinent to today's visit, and charting.   We discussed the anxiety and ways to prevent it.  Still recommend no caffeine at all.  Needs to exercise daily even if it is taking a short walk.  He is already on a high dose of gabapentin, however the maximum dose is 3600 mg/day so we can increase by 1/2 pill.  I also recommend increasing the Cymbalta.  He was on maximum dose in the past but we thought it was worsening the insomnia so we decreased the dose back.  Now he is not sure if and is willing to retry that.    Discussed smoking, he is not ready to quit and states he will let me know if and when he is ready to quit.  Continue Abilify 20 mg, 1 p.o. daily. Continue Strattera 100 mg, 1 p.o. every morning. Increase Cymbalta to 60 mg, 2 p.o. daily. Increase gabapentin 800 mg  to 1 p.o. 4 times daily with 1/2 pill mid day.  Continue hydroxyzine 50 mg, 1 p.o. 4 times daily as needed.   Continue trazodone 300 mg, 1 p.o. nightly as needed. Continue Ingrezza 80 mg daily.  Strongly recommend counseling. Return in 4 weeks.   Melony Overly, PA-C

## 2023-01-11 ENCOUNTER — Other Ambulatory Visit: Payer: Self-pay

## 2023-01-11 MED ORDER — VALBENAZINE TOSYLATE 80 MG PO CAPS: 80.0000 mg | ORAL_CAPSULE | Freq: Every day | ORAL | 1 refills | Status: DC

## 2023-02-04 ENCOUNTER — Other Ambulatory Visit: Payer: Self-pay | Admitting: Physician Assistant

## 2023-02-08 ENCOUNTER — Ambulatory Visit: Payer: Medicare HMO | Attending: Internal Medicine

## 2023-02-08 ENCOUNTER — Ambulatory Visit: Payer: Medicaid Other | Admitting: Cardiology

## 2023-02-08 DIAGNOSIS — E782 Mixed hyperlipidemia: Secondary | ICD-10-CM

## 2023-02-09 LAB — COMPREHENSIVE METABOLIC PANEL
ALT: 103 IU/L — ABNORMAL HIGH (ref 0–44)
AST: 49 IU/L — ABNORMAL HIGH (ref 0–40)
Albumin: 4.6 g/dL (ref 4.1–5.1)
Alkaline Phosphatase: 102 IU/L (ref 44–121)
BUN/Creatinine Ratio: 12 (ref 9–20)
BUN: 13 mg/dL (ref 6–24)
Bilirubin Total: 0.4 mg/dL (ref 0.0–1.2)
CO2: 22 mmol/L (ref 20–29)
Calcium: 9.7 mg/dL (ref 8.7–10.2)
Chloride: 103 mmol/L (ref 96–106)
Creatinine, Ser: 1.05 mg/dL (ref 0.76–1.27)
Globulin, Total: 2.5 g/dL (ref 1.5–4.5)
Glucose: 167 mg/dL — ABNORMAL HIGH (ref 70–99)
Potassium: 4.1 mmol/L (ref 3.5–5.2)
Sodium: 141 mmol/L (ref 134–144)
Total Protein: 7.1 g/dL (ref 6.0–8.5)
eGFR: 89 mL/min/{1.73_m2} (ref >59)

## 2023-02-09 LAB — LIPID PANEL
Chol/HDL Ratio: 11.4 ratio — ABNORMAL HIGH (ref 0.0–5.0)
Cholesterol, Total: 183 mg/dL (ref 100–199)
HDL: 16 mg/dL — ABNORMAL LOW (ref >39)
LDL Chol Calc (NIH): 63 mg/dL (ref 0–99)
Triglycerides: 683 mg/dL (ref 0–149)
VLDL Cholesterol Cal: 104 mg/dL — ABNORMAL HIGH (ref 5–40)

## 2023-02-09 LAB — LDL CHOLESTEROL, DIRECT: LDL Direct: 49 mg/dL (ref 0–99)

## 2023-02-10 NOTE — Progress Notes (Unsigned)
Cardiology Office Note   Date:  02/11/2023   ID:  Matthew [name], Matthew [name] [birth year], MRN [id number]  PCP:  No primary care provider on file.    No chief complaint on file.  CAD  Wt Readings from Last 3 Encounters:  02/11/23 269 lb (122 kg)  09/21/22 273 lb (123.8 kg)  07/18/22 273 lb 9.6 oz (124.1 kg)       History of Present Illness: Matthew [name] is a 47 y.o. male former patient of Dr. Shari Prows with a hx of HTN, HLD, pre-DM, celiac dz, schizoaffective d/o, anxiety, ADHD, OSA not on CPAP, and NSTEMI in 2022 s/p DES to pLAD and DES to dRCA who presents to clinic for follow-up.   Patient admitted with NSTEMI 06/06/20 treated with DES pLAD and DES dRCA, and severe LVEF 25-30%. Patient left AMA 06/08/19 and was seen urgently in clinic by Jacolyn Reedy. During that visit, he was more SOB and had been eating a lot of fast food and had not filled his scripts for atenolol or lisinopril.  06/2020 Cardiac monitor showed: rare ectopy with no significant arrhythmias or ectopy.   07/2020: ER visit for abdominal pain.   Was admitted on 10/07-10/08/22 for chest pain. Cath showed patent LAD stent, 25% Lcx, 25-45% prox RCA disease, anterior hypokinesis with EF 50%, LVEDP . Recommended for continued medical management.   Readmitted 04/23/21 for chest pain and syncope. ECG nonischemic. Trop negative. TTE with LVEF 60-65%, normal RV, no significant valve disease. Symptoms thought to be vagal in nature as he fainted with coughing. Discharged home.  In 2023, it was noted he quit smoking with the help of a nicotine patch.  He was working on his diabetes at that time when A1c was 8.1.  Denies : Chest pain. Dizziness. Leg edema. Nitroglycerin use. Orthopnea. Palpitations. Paroxysmal nocturnal dyspnea. Shortness of breath. Syncope.    [place], several times a week.  Past Medical History:  Diagnosis Date   Anxiety    Arthritis    Celiac disease     Coronary artery disease    Dairy allergy    Depression    Diabetes mellitus without complication (HCC)    High cholesterol    Hypertension    Hypothyroidism    Myocardial infarction (HCC)    Paranoia (HCC)    Schizoaffective disorder (HCC)     Past Surgical History:  Procedure Laterality Date   APPENDECTOMY     CARDIAC CATHETERIZATION     CORONARY STENT INTERVENTION N/A 06/06/2020   Procedure: CORONARY STENT INTERVENTION;  Surgeon: Swaziland, Peter M, MD;  Location: [facility] INVASIVE CV LAB;  Service: Cardiovascular;  Laterality: N/A;  prox LAD, distal RCA   CORONARY ULTRASOUND/IVUS N/A 06/06/2020   Procedure: Intravascular Ultrasound/IVUS;  Surgeon: Swaziland, Peter M, MD;  Location: [facility] INVASIVE CV LAB;  Service: Cardiovascular;  Laterality: N/A;   LEFT HEART CATH AND CORONARY ANGIOGRAPHY N/A 06/06/2020   Procedure: LEFT HEART CATH AND CORONARY ANGIOGRAPHY;  Surgeon: Swaziland, Peter M, MD;  Location: [facility] INVASIVE CV LAB;  Service: Cardiovascular;  Laterality: N/A;   LEFT HEART CATH AND CORONARY ANGIOGRAPHY N/A 02/24/2021   Procedure: LEFT HEART CATH AND CORONARY ANGIOGRAPHY;  Surgeon: Lyn Records, MD;  Location: [facility] INVASIVE CV LAB;  Service: Cardiovascular;  Laterality: N/A;   NASAL SEPTUM SURGERY     TOTAL SHOULDER ARTHROPLASTY Right 11/28/2021   Procedure: TOTAL SHOULDER ARTHROPLASTY;  Surgeon: Teryl Lucy, MD;  Location: [facility] ORS;  Service:  Orthopedics;  Laterality: Right;   WRIST FRACTURE SURGERY     right wrist     Current Outpatient Medications  Medication Sig Dispense Refill   ARIPiprazole (ABILIFY) 20 MG tablet TAKE 1 TABLET BY MOUTH EVERYDAY AT BEDTIME 90 tablet 1   aspirin EC 81 MG tablet Take 1 tablet (81 mg total) by mouth daily. Swallow whole. 30 tablet 0   atomoxetine (STRATTERA) 100 MG capsule TAKE 1 CAPSULE BY MOUTH EVERY DAY 30 capsule 1   atorvastatin (LIPITOR) 80 MG tablet Take 1 tablet (80 mg total) by mouth daily. 90 tablet 2   Bempedoic Acid-Ezetimibe (NEXLIZET)  180-10 MG TABS Take 1 tablet by mouth daily. 90 tablet 3   carvedilol (COREG) 12.5 MG tablet Take 3 tablets (37.5 mg total) by mouth 2 (two) times daily. 540 tablet 2   clopidogrel (PLAVIX) 75 MG tablet Take 1 tablet (75 mg total) by mouth daily. 90 tablet 3   DULoxetine (CYMBALTA) 60 MG capsule Take 2 capsules (120 mg total) by mouth daily. 60 capsule 1   empagliflozin (JARDIANCE) 25 MG TABS tablet Take 1 tablet (25 mg total) by mouth every morning. 90 tablet 3   fenofibrate (TRICOR) 145 MG tablet Take 1 tablet (145 mg total) by mouth daily. 90 tablet 3   gabapentin (NEURONTIN) 800 MG tablet One po qid w/ an extra 1/2 pill during the day prn. 135 tablet 1   hydrOXYzine (ATARAX) 50 MG tablet TAKE 1 TABLET BY MOUTH EVERY 6 HOURS AS NEEDED. 360 tablet 1   icosapent Ethyl (VASCEPA) 1 g capsule Take 2 capsules (2 g total) by mouth 2 (two) times daily. 360 capsule 3   insulin glargine (LANTUS SOLOSTAR) 100 UNIT/ML Solostar Pen Inject 28 Units into the skin daily. 30 mL 3   Insulin Pen Needle 32G X 4 MM MISC 1 Device by Does not apply route daily in the afternoon. 100 each 3   levothyroxine (SYNTHROID) 25 MCG tablet Take 1 tablet (25 mcg total) by mouth in the morning. 30 tablet 0   metFORMIN (GLUCOPHAGE) 1000 MG tablet Take 1 tablet (1,000 mg total) by mouth 2 (two) times daily with a meal. 180 tablet 3   nicotine (NICODERM CQ - DOSED IN MG/24 HOURS) 21 mg/24hr patch Place 1 patch (21 mg total) onto the skin daily. 28 patch 3   nicotine polacrilex ([facility] NICOTINE) 4 MG gum Take 1 each (4 mg total) by mouth as needed for smoking cessation. 100 tablet 1   traMADol (ULTRAM) 50 MG tablet Take 50 mg by mouth 2 (two) times daily as needed.     traZODone (DESYREL) 150 MG tablet Take 2 tablets (300 mg total) by mouth at bedtime. 60 tablet 1   valbenazine (INGREZZA) 80 MG capsule Take 1 capsule (80 mg total) by mouth daily. 30 capsule 1   trazodone (DESYREL) 300 MG tablet Take 300 mg by mouth at bedtime.     No  current facility-administered medications for this visit.    Allergies:   Gluten meal and Lactose intolerance (gi)    Social History:  The patient  reports that he has quit smoking. His smoking use included cigarettes. He has a 1 pack-year smoking history. He has never used smokeless tobacco. He reports that he does not drink alcohol and does not use drugs.   Family History:  The patient's family history includes Asthma in his mother; Hypertension in his father and mother.    ROS:  Please see the history of present  illness.   Otherwise, review of systems are positive for .   All other systems are reviewed and negative.    PHYSICAL EXAM: VS:  BP 114/72   Pulse 98   Ht 6\' 3"  (1.905 m)   Wt 269 lb (122 kg)   SpO2 95%   BMI 33.62 kg/m  , BMI Body mass index is 33.62 kg/m. GEN: Well nourished, well developed, in no acute distress HEENT: normal Neck: no JVD, carotid bruits, or masses Cardiac: RRR; no murmurs, rubs, or gallops,no edema  Respiratory:  clear to auscultation bilaterally, normal work of breathing GI: soft, nontender, nondistended, + BS MS: no deformity or atrophy Skin: warm and dry, no rash Neuro:  Strength and sensation are intact Psych: euthymic mood, full affect   EKG:   The ekg ordered 2/24 demonstrates sinus tach   Recent Labs: 06/30/2022: TSH 0.498 07/01/2022: Hemoglobin 15.2; Platelets 247 02/08/2023: ALT 103; BUN 13; Creatinine, Ser 1.05; Potassium 4.1; Sodium 141   Lipid Panel    Component Value Date/Time   CHOL 183 02/08/2023 0832   TRIG 683 (HH) 02/08/2023 0832   HDL 16 (L) 02/08/2023 0832   CHOLHDL 11.4 (H) 02/08/2023 0832   CHOLHDL 7.2 07/04/2022 0634   VLDL 49 (H) 07/04/2022 0634   LDLCALC 63 02/08/2023 0832   LDLDIRECT 49 02/08/2023 0832   LDLDIRECT 42.4 02/25/2021 0139     Other studies Reviewed: Additional studies/ records that were reviewed today with results demonstrating: LDL 49 in September 2024 but triglycerides 603.  Partially  nonfasting- had just eaten a little.   ASSESSMENT AND PLAN:  CAD: Multivessel PCI in 2022.  LVEF has improved to the normal range.  Stable disease with patent stents on repeat cath in October 2022. Ischemic cardiomyopathy: Resolved.  Continue medical therapy. Hyperlipidemia: High-dose statin and Vascepa along with fenofibrate have been prescribed to manage lipids and triglycerides.  Been out of the nexLizet for a month since [facility] stop carrying it.  TG elevated 3 days ago.  Had eaten a little prior to test.  Several episodes of syncope.  Resolved after stopping lisinopril for cough.  Cough stimulated an episode of syncope at 1 point. Type 2 diabetes: A1C 7.6 in May 2024.  Whole food, plant-based diet.  High-fiber diet avoid processed foods.  Avoid added sugar. OSA: Cannot tolerate CPAP due to sleeping position.  Will refer to Dr. Mayford Knife for sleep apnea and general cardiology follow-up. Tobacco abuse: started smoking smoking again.  Less than before.  Ask Megan about Nexlizet and ADD med in the setting of CAD.    Current medicines are reviewed at length with the patient today.  The patient concerns regarding his medicines were addressed.  The following changes have been made:  No change  Labs/ tests ordered today include:  No orders of the defined types were placed in this encounter.   Recommend 150 minutes/week of aerobic exercise Low fat, low carb, high fiber diet recommended  Disposition:   FU in next available with Dr. Mayford Knife   Signed, Lance Muss, MD  02/11/2023 10:23 AM    [facility] [address] [place], [place], [place]  [address] Phone: [phone]; Fax: [phone]

## 2023-02-11 ENCOUNTER — Encounter: Payer: Self-pay | Admitting: Interventional Cardiology

## 2023-02-11 ENCOUNTER — Other Ambulatory Visit (HOSPITAL_COMMUNITY): Payer: Self-pay

## 2023-02-11 ENCOUNTER — Ambulatory Visit: Payer: Medicare HMO | Attending: Cardiology | Admitting: Interventional Cardiology

## 2023-02-11 ENCOUNTER — Telehealth: Payer: Self-pay | Admitting: Pharmacy Technician

## 2023-02-11 VITALS — BP 114/72 | HR 98 | Ht 75.0 in | Wt 269.0 lb

## 2023-02-11 DIAGNOSIS — I25118 Atherosclerotic heart disease of native coronary artery with other forms of angina pectoris: Secondary | ICD-10-CM | POA: Diagnosis not present

## 2023-02-11 DIAGNOSIS — I10 Essential (primary) hypertension: Secondary | ICD-10-CM

## 2023-02-11 DIAGNOSIS — I252 Old myocardial infarction: Secondary | ICD-10-CM

## 2023-02-11 DIAGNOSIS — E785 Hyperlipidemia, unspecified: Secondary | ICD-10-CM | POA: Diagnosis not present

## 2023-02-11 MED ORDER — NEXLIZET 180-10 MG PO TABS: 1.0000 | ORAL_TABLET | Freq: Every day | ORAL | 3 refills | Status: DC

## 2023-02-11 NOTE — Telephone Encounter (Signed)
Pharmacy Patient Advocate Encounter   Received notification from  staff messages  that prior authorization for nexlizet is required/requested.   Insurance verification completed.   The patient is insured through [employer] .   Per test claim: The current 02/11/23 day co-pay is, $0.00.  No PA needed at this time. This test claim was processed through [facility]- copay amounts may vary at other pharmacies due to pharmacy/plan contracts, or as the patient moves through the different stages of their insurance plan.

## 2023-02-11 NOTE — Patient Instructions (Addendum)
Medication Instructions:  Your physician recommends that you continue on your current medications as directed. Please refer to the Current Medication list given to you today.  *If you need a refill on your cardiac medications before your next appointment, please call your pharmacy*   Lab Work: none If you have labs (blood work) drawn today and your tests are completely normal, you will receive your results only by: MyChart Message (if you have MyChart) OR A paper copy in the mail If you have any lab test that is abnormal or we need to change your treatment, we will call you to review the results.   Testing/Procedures: none   Follow-Up: At [facility], you and your health needs are our priority.  As part of our continuing mission to provide you with exceptional heart care, we have created designated Provider Care Teams.  These Care Teams include your primary Cardiologist (physician) and Advanced Practice Providers (APPs -  Physician Assistants and Nurse Practitioners) who all work together to provide you with the care you need, when you need it.  We recommend signing up for the patient portal called "MyChart".  Sign up information is provided on this After Visit Summary.  MyChart is used to connect with patients for Virtual Visits (Telemedicine).  Patients are able to view lab/test results, encounter notes, upcoming appointments, etc.  Non-urgent messages can be sent to your provider as well.   To learn more about what you can do with MyChart, go to [email or web address].    Your next appointment:   First available  Provider:    Dr Debroah Loop sleep     Other Instructions   Patient to also follow up with Dr Mayford Knife for cardiology

## 2023-02-11 NOTE — Telephone Encounter (Signed)
No PA needed, refill sent in.

## 2023-02-11 NOTE — Telephone Encounter (Signed)
-----   Message from [place] sent at 02/11/2023 11:50 AM EDT ----- Pt having trouble getting Nexlizet from his pharmacy, please see if PA is needed with his new Medicare insurance (had Medicaid earlier this year). Thanks!

## 2023-02-11 NOTE — Addendum Note (Signed)
Addended by: Makena Mcgrady E on: 02/11/2023 12:44 PM   Modules accepted: Orders

## 2023-02-18 ENCOUNTER — Encounter: Payer: Self-pay | Admitting: Physician Assistant

## 2023-02-18 ENCOUNTER — Ambulatory Visit (INDEPENDENT_AMBULATORY_CARE_PROVIDER_SITE_OTHER): Payer: Medicare HMO | Admitting: Physician Assistant

## 2023-02-18 ENCOUNTER — Other Ambulatory Visit: Payer: Self-pay | Admitting: Physician Assistant

## 2023-02-18 DIAGNOSIS — F5105 Insomnia due to other mental disorder: Secondary | ICD-10-CM

## 2023-02-18 DIAGNOSIS — F902 Attention-deficit hyperactivity disorder, combined type: Secondary | ICD-10-CM

## 2023-02-18 DIAGNOSIS — F250 Schizoaffective disorder, bipolar type: Secondary | ICD-10-CM | POA: Diagnosis not present

## 2023-02-18 DIAGNOSIS — F411 Generalized anxiety disorder: Secondary | ICD-10-CM | POA: Diagnosis not present

## 2023-02-18 DIAGNOSIS — F99 Mental disorder, not otherwise specified: Secondary | ICD-10-CM

## 2023-02-18 DIAGNOSIS — G2401 Drug induced subacute dyskinesia: Secondary | ICD-10-CM

## 2023-02-18 DIAGNOSIS — F17200 Nicotine dependence, unspecified, uncomplicated: Secondary | ICD-10-CM

## 2023-02-18 MED ORDER — VILOXAZINE HCL ER 200 MG PO CP24: 600.0000 mg | ORAL_CAPSULE | Freq: Every day | ORAL | 1 refills | Status: DC

## 2023-02-18 NOTE — Progress Notes (Signed)
[facility] Med Check  Patient ID: Matthew [name],  MRN: [id number]  PCP: No primary care provider on file.  Date of Evaluation: 02/18/2023 Time spent:25 minutes  Chief Complaint:  Chief Complaint   Depression; Anxiety; ADHD; Insomnia; Follow-up    HISTORY/CURRENT STATUS: HPI For routine med check.   Better as far as sleep goes. Increasing the Trazodone has helped. Has vivid dreams but not extremely disturbing to him. Doesn't feel like anything needs to be changed as far as that goes.   The Strattera isn't helping anymore. Unable to stay on task. Now that his Mom is in a nursing home and he doesn't need to help out with her, he would like to do something, work a few hours a week or something if possible. But he can't focus.   Patient is able to enjoy things.  Energy and motivation are good.  No extreme sadness, tearfulness, or feelings of hopelessness.  ADLs and personal hygiene are normal.  Appetite has not changed.  Weight is stable.  Does have anxiety, takes the gabapentin as preventative and hydroxyzine as a rescue med.  The anxiety is pretty well-controlled right now.  He is not having panic attacks.  He is still smoking.  Not ready to quit.  Denies suicidal or homicidal thoughts.  He is planning on getting a 64-1/2-year-old pitbull and ask if I can write a letter for a service animal so he can have the dog at his apartment.  She has not been trained as a Metallurgist and not trained at all yet.  As far as he knows she is well behaved.   Patient denies increased energy with decreased need for sleep, increased talkativeness, racing thoughts, impulsivity or risky behaviors, increased spending, increased libido, grandiosity, increased irritability or anger, paranoia, or hallucinations.  Denies dizziness, syncope, seizures, numbness, tingling, tremor, tics, unsteady gait, slurred speech, confusion. Denies muscle or joint pain, stiffness, or dystonia. Denies unexplained  weight loss, frequent infections, or sores that heal slowly.  No polyphagia, polydipsia, or polyuria. Denies visual changes or paresthesias.   Individual Medical History/ Review of Systems: Changes? :Yes    Saw cardiology, see note  Past medications for mental health diagnoses include: Risperdal, Zyprexa, Abilify, [name], Klonopin, Gabapentin, Xanax, Valium, Ativan, Hydroxyzine, Trilafon, Atenolol, Cymbalta, Prozac, Lexapro, Paxil, Lithium, Adderall, Ritalin, Mirtazepine, Sonata was ineffective.  Austedo helped some, Strattera  Allergies: Gluten meal and Lactose intolerance (gi)  Current Medications:  Current Outpatient Medications:    ARIPiprazole (ABILIFY) 20 MG tablet, TAKE 1 TABLET BY MOUTH EVERYDAY AT BEDTIME, Disp: 90 tablet, Rfl: 1   aspirin EC 81 MG tablet, Take 1 tablet (81 mg total) by mouth daily. Swallow whole., Disp: 30 tablet, Rfl: 0   atorvastatin (LIPITOR) 80 MG tablet, Take 1 tablet (80 mg total) by mouth daily., Disp: 90 tablet, Rfl: 2   Bempedoic Acid-Ezetimibe (NEXLIZET) 180-10 MG TABS, Take 1 tablet by mouth daily., Disp: 90 tablet, Rfl: 3   carvedilol (COREG) 12.5 MG tablet, Take 3 tablets (37.5 mg total) by mouth 2 (two) times daily., Disp: 540 tablet, Rfl: 2   clopidogrel (PLAVIX) 75 MG tablet, Take 1 tablet (75 mg total) by mouth daily., Disp: 90 tablet, Rfl: 3   DULoxetine (CYMBALTA) 60 MG capsule, Take 2 capsules (120 mg total) by mouth daily., Disp: 60 capsule, Rfl: 1   empagliflozin (JARDIANCE) 25 MG TABS tablet, Take 1 tablet (25 mg total) by mouth every morning., Disp: 90 tablet, Rfl: 3   fenofibrate (TRICOR) 145 MG  tablet, Take 1 tablet (145 mg total) by mouth daily., Disp: 90 tablet, Rfl: 3   gabapentin (NEURONTIN) 800 MG tablet, One po qid w/ an extra 1/2 pill during the day prn., Disp: 135 tablet, Rfl: 1   hydrOXYzine (ATARAX) 50 MG tablet, TAKE 1 TABLET BY MOUTH EVERY 6 HOURS AS NEEDED., Disp: 360 tablet, Rfl: 1   icosapent Ethyl (VASCEPA) 1 g capsule, Take  2 capsules (2 g total) by mouth 2 (two) times daily., Disp: 360 capsule, Rfl: 3   insulin glargine (LANTUS SOLOSTAR) 100 UNIT/ML Solostar Pen, Inject 28 Units into the skin daily., Disp: 30 mL, Rfl: 3   Insulin Pen Needle 32G X 4 MM MISC, 1 Device by Does not apply route daily in the afternoon., Disp: 100 each, Rfl: 3   metFORMIN (GLUCOPHAGE) 1000 MG tablet, Take 1 tablet (1,000 mg total) by mouth 2 (two) times daily with a meal., Disp: 180 tablet, Rfl: 3   traMADol (ULTRAM) 50 MG tablet, Take 50 mg by mouth 2 (two) times daily as needed., Disp: , Rfl:    trazodone (DESYREL) 300 MG tablet, Take 300 mg by mouth at bedtime., Disp: , Rfl:    valbenazine (INGREZZA) 80 MG capsule, Take 1 capsule (80 mg total) by mouth daily., Disp: 30 capsule, Rfl: 1   viloxazine ER (QELBREE) 200 MG 24 hr capsule, Take 3 capsules (600 mg total) by mouth daily., Disp: 90 capsule, Rfl: 1   levothyroxine (SYNTHROID) 25 MCG tablet, Take 1 tablet (25 mcg total) by mouth in the morning. (Patient not taking: Reported on 02/18/2023), Disp: 30 tablet, Rfl: 0   nicotine (NICODERM CQ - DOSED IN MG/24 HOURS) 21 mg/24hr patch, Place 1 patch (21 mg total) onto the skin daily. (Patient not taking: Reported on 02/18/2023), Disp: 28 patch, Rfl: 3   nicotine polacrilex ([facility] NICOTINE) 4 MG gum, Take 1 each (4 mg total) by mouth as needed for smoking cessation. (Patient not taking: Reported on 02/18/2023), Disp: 100 tablet, Rfl: 1 Medication Side Effects: none  Family Medical/ Social History: Changes? Mom is now in Nursing home.   MENTAL HEALTH EXAM:  There were no vitals taken for this visit.There is no height or weight on file to calculate BMI.  General Appearance: Casual and Well Groomed  Eye Contact:  Good  Speech:  Clear and Coherent and Normal Rate  Volume:  Normal  Mood:  Euthymic  Affect:  Congruent  Thought Process:  Goal Directed and Descriptions of Associations: Circumstantial  Orientation:  Full (Time, Place, and Person)   Thought Content: Logical   Suicidal Thoughts:  No  Homicidal Thoughts:  No  Memory:  WNL  Judgement:  Fair  Insight:  Good  Psychomotor Activity:  Normal  Concentration:  Concentration: Fair and Attention Span: Fair  Recall:  Good  Fund of Knowledge: Good  Language: Good  Assets:  Desire for Improvement Financial Resources/Insurance Housing Transportation  ADL's:  Intact  Cognition: WNL  Prognosis:  Good          02/08/2023 Lipids total cholesterol 183, triglycerides 683 (patient states he was not fasting) HDL 16, LDL 63 CMP glucose 167, BUN and creatinine normal, electrolytes normal, AST 49, ALT 103  DIAGNOSES:    ICD-10-CM   1. Schizoaffective disorder, bipolar type (HCC)  F25.0     2. Generalized anxiety disorder  F41.1     3. Attention deficit hyperactivity disorder (ADHD), combined type, moderate  F90.2     4. Insomnia due to other mental  disorder  F51.05    F99     5. Tardive dyskinesia  G24.01     6. Smoker  F17.200      Receiving Psychotherapy: No   RECOMMENDATIONS:  PDMP reviewed.  Gabapentin filled 02/04/2023.  Also on tramadol.  Klonopin filled 07/28/2022 by Dr. Carlyn Reichert.   I provided 25 minutes of face to face time during this encounter, including time spent before and after the visit in records review, medical decision making, counseling pertinent to today's visit, and charting.   Overall Matthew [name] is doing quite a bit better since increasing the Cymbalta and gabapentin.  The trazodone is also helping, he is sleeping better which makes him feel better during the day as well.  He is having trouble focusing though and does not think the Strattera is very helpful at all anymore.  We discussed different options.  I recommend changing the Strattera to [place].  Benefits, risk and side effects were discussed and he accepts and would like to try it.  He asked about a letter for a service dog.  He will be getting a 86.62-year-old male pitbull terrier sometime soon.   She has not been trained yet.  He is aware that I can only write a letter for an emotional support dog, not a service animal since she would have to go through extensive training for that.  He has had dogs, and specifically pit bulls in the past without any issues.  I will write a letter for him so he can have the animal in his apartment, but must be leashed when outside of his apartment.  Smoking cessation discussed.    Sleep hygiene discussed.  Discontinue Strattera. Continue Abilify 20 mg, 1 p.o. daily. Continue Cymbalta 60 mg, 2 p.o. daily. Continue gabapentin 800 mg 1 p.o. 4 times daily with 1/2 pill mid day.  Continue hydroxyzine 50 mg, 1 p.o. 4 times daily as needed.   Continue trazodone 300 mg, 1 p.o. nightly as needed. Continue Ingrezza 80 mg daily.  Start Quelbree 200 mg, 3 p.o. daily.  A starter pack with 200 mg was given to him, he understands to take 3 pills at 1 time. Strongly recommend counseling. Return in 4 weeks.   Melony Overly, PA-C

## 2023-02-20 ENCOUNTER — Telehealth: Payer: Self-pay

## 2023-02-20 NOTE — Telephone Encounter (Signed)
PA started

## 2023-02-21 ENCOUNTER — Telehealth: Payer: Self-pay | Admitting: *Deleted

## 2023-02-21 NOTE — Telephone Encounter (Signed)
   [facility] Pre-operative Risk Assessment    Patient Name: Matthew [name]  DOB: 18-Jan-1976 MRN: [id number]  HEARTCARE STAFF:  - IMPORTANT!!!!!! Under Visit Info/Reason for Call, type in Other and utilize the format Clearance MM/DD/YY or Clearance TBD. Do not use dashes or single digits. - Please review there is not already an duplicate clearance open for this procedure. - If request is for dental extraction, please clarify the # of teeth to be extracted. - If the patient is currently at the dentist's office, call Pre-Op Callback Staff (MA/nurse) to input urgent request.  - If the patient is not currently in the dentist office, please route to the Pre-Op pool.  Request for surgical clearance:  What type of surgery is being performed? ESI  When is this surgery scheduled? TBD  What type of clearance is required (medical clearance vs. Pharmacy clearance to hold med vs. Both)? Both  Are there any medications that need to be held prior to surgery and how long? Aspirin 81, plavix  Practice name and name of physician performing surgery? [facility]  What is the office phone number?[phone]   7.   What is the office fax number? [phone]  8.   Anesthesia type (None, local, MAC, general) ? Not listed   Valrie Hart 02/21/2023, 5:09 PM  _________________________________________________________________   (provider comments below)

## 2023-02-22 NOTE — Telephone Encounter (Signed)
Dr. Eldridge Dace  , patient's chart was reviewed for preoperative cardiac evaluation.  He/she was seen by you on 02/11/2023 and according to protocol, we request that you comment on cardiac risk for upcoming procedure since office visit was less than 2 months ago. He is having an ESI.  I will make the recommendations concerning the ASA and Plavix.    Please route your response to p cv div preop.  Thank you, Joni Reining DNP, ANP, AACC

## 2023-02-22 NOTE — Telephone Encounter (Signed)
Noted  

## 2023-02-22 NOTE — Telephone Encounter (Signed)
Prior authorization submitted and approved for Qelbree 200 mg #90/30 day with [employer] effective 05/21/2022-05/20/2024. OZ#[id number]

## 2023-02-24 ENCOUNTER — Other Ambulatory Visit: Payer: Self-pay | Admitting: Physician Assistant

## 2023-02-25 NOTE — Telephone Encounter (Signed)
Routed to pre-op callback

## 2023-02-25 NOTE — Telephone Encounter (Addendum)
   Patient Name: Matthew [name]  DOB: Sep 27, 1975 MRN: [id number]  Primary Cardiologist: Lance Muss, MD  Chart reviewed as part of pre-operative protocol coverage. Given past medical history and time since last visit, based on ACC/AHA guidelines, Matthew [name] is at acceptable risk for the planned procedure without further cardiovascular testing.   Per office protocol, if patient is without any new symptoms or concerns at the time of their virtual visit, he/she may hold ASA for 7 days prior to procedure.Marland Kitchen He may hold Plavix for 5 days prior to procedure.  Please resume ASA and Plavix  as soon as possible postprocedure, at the discretion of the surgeon.    The patient was advised that if he develops new symptoms prior to surgery to contact our office to arrange for a follow-up visit, and he verbalized understanding.  I will route this recommendation to the requesting party via Epic fax function and remove from pre-op pool.  Please call with questions.  Joni Reining, NP 02/25/2023, 7:17 AM

## 2023-02-26 NOTE — Telephone Encounter (Signed)
Forwarded clearance over to fax # [phone]

## 2023-03-04 ENCOUNTER — Other Ambulatory Visit: Payer: Self-pay | Admitting: Physician Assistant

## 2023-03-10 ENCOUNTER — Other Ambulatory Visit: Payer: Self-pay

## 2023-03-10 MED ORDER — VALBENAZINE TOSYLATE 80 MG PO CAPS: 80.0000 mg | ORAL_CAPSULE | Freq: Every day | ORAL | 0 refills | Status: DC

## 2023-03-13 ENCOUNTER — Other Ambulatory Visit: Payer: Self-pay | Admitting: Physician Assistant

## 2023-03-18 ENCOUNTER — Encounter: Payer: Self-pay | Admitting: Physician Assistant

## 2023-03-18 ENCOUNTER — Ambulatory Visit (INDEPENDENT_AMBULATORY_CARE_PROVIDER_SITE_OTHER): Payer: Medicare HMO | Admitting: Physician Assistant

## 2023-03-18 DIAGNOSIS — F411 Generalized anxiety disorder: Secondary | ICD-10-CM

## 2023-03-18 DIAGNOSIS — F902 Attention-deficit hyperactivity disorder, combined type: Secondary | ICD-10-CM | POA: Diagnosis not present

## 2023-03-18 DIAGNOSIS — F17200 Nicotine dependence, unspecified, uncomplicated: Secondary | ICD-10-CM

## 2023-03-18 DIAGNOSIS — G2401 Drug induced subacute dyskinesia: Secondary | ICD-10-CM | POA: Diagnosis not present

## 2023-03-18 DIAGNOSIS — F250 Schizoaffective disorder, bipolar type: Secondary | ICD-10-CM | POA: Diagnosis not present

## 2023-03-18 MED ORDER — ATOMOXETINE HCL 100 MG PO CAPS: 100.0000 mg | ORAL_CAPSULE | Freq: Every day | ORAL | 1 refills | Status: DC

## 2023-03-18 MED ORDER — DULOXETINE HCL 60 MG PO CPEP: 120.0000 mg | ORAL_CAPSULE | Freq: Every day | ORAL | 1 refills | Status: DC

## 2023-03-18 MED ORDER — ARIPIPRAZOLE 20 MG PO TABS: 20.0000 mg | ORAL_TABLET | Freq: Every day | ORAL | 1 refills | Status: DC

## 2023-03-18 NOTE — Progress Notes (Signed)
[facility]  Patient ID: Matthew [name],  MRN: [id number]  PCP: No primary care provider on file.  Date of Evaluation: 03/18/2023 Time spent: 26 minutes  Chief Complaint:  Chief Complaint   Follow-up    HISTORY/CURRENT STATUS: HPI For routine med check.   At the last visit a month ago we changed Strattera to [place].  It caused a headache that did not go away so he stopped taking it after a few weeks.  He went back on Strattera.  States it is working better now than it did when we stopped it.  He would like to stay on it right now. States that attention is good without easy distractibility.  Able to focus on things and finish tasks to completion.   Overall he is doing well.  He is able to enjoy things, especially playing bass.  He is sleeping well.  Energy and motivation are good most of the time.  ADLs and personal hygiene are normal.  Appetite is normal and weight is stable.  Not crying easily.  No suicidal or homicidal thoughts.  The anxiety is well controlled.  Since we increased the gabapentin he has felt much better.  He has not needed the hydroxyzine as much.  He is not as overwhelmed.  Of course part of that could be related to the fact that his mother is now in a memory care facility and he is not helping his dad take care of her at home.  Patient denies increased energy with decreased need for sleep, increased talkativeness, racing thoughts, impulsivity or risky behaviors, increased spending, increased libido, grandiosity, increased irritability or anger, paranoia, or hallucinations.  Denies dizziness, syncope, seizures, numbness, tingling, tremor, tics, unsteady gait, slurred speech, confusion. Denies muscle or joint pain, stiffness, or dystonia. Denies unexplained weight loss, frequent infections, or sores that heal slowly.  No polyphagia, polydipsia, or polyuria. Denies visual changes or paresthesias.   Individual Medical History/ Review of Systems:  Changes? :Yes    Has a tooth ache, will have it pulled next week.  Past medications for mental health diagnoses include: Risperdal, Zyprexa, Abilify, [name], Klonopin, Gabapentin, Xanax, Valium, Ativan, Hydroxyzine, Trilafon, Atenolol, Cymbalta, Prozac, Lexapro, Paxil, Lithium, Adderall, Ritalin, Mirtazepine, Sonata was ineffective.  Austedo helped some, Strattera, Quelbree caused headaches.  Ingrezza  Allergies: Gluten meal and Lactose intolerance (gi)  Current Medications:  Current Outpatient Medications:    aspirin EC 81 MG tablet, Take 1 tablet (81 mg total) by mouth daily. Swallow whole., Disp: 30 tablet, Rfl: 0   atorvastatin (LIPITOR) 80 MG tablet, Take 1 tablet (80 mg total) by mouth daily., Disp: 90 tablet, Rfl: 2   Bempedoic Acid-Ezetimibe (NEXLIZET) 180-10 MG TABS, Take 1 tablet by mouth daily., Disp: 90 tablet, Rfl: 3   carvedilol (COREG) 12.5 MG tablet, Take 3 tablets (37.5 mg total) by mouth 2 (two) times daily., Disp: 540 tablet, Rfl: 2   clopidogrel (PLAVIX) 75 MG tablet, Take 1 tablet (75 mg total) by mouth daily., Disp: 90 tablet, Rfl: 3   empagliflozin (JARDIANCE) 25 MG TABS tablet, Take 1 tablet (25 mg total) by mouth every morning., Disp: 90 tablet, Rfl: 3   fenofibrate (TRICOR) 145 MG tablet, Take 1 tablet (145 mg total) by mouth daily., Disp: 90 tablet, Rfl: 3   gabapentin (NEURONTIN) 800 MG tablet, TAKE 1 TABLET BY MOUTH 4 TIMES A DAY WITH AN EXTRA 1/2 TABLET DURING THE DAY AS NEEDED, Disp: 135 tablet, Rfl: 0   hydrOXYzine (ATARAX) 50 MG tablet, TAKE  1 TABLET BY MOUTH EVERY 6 HOURS AS NEEDED., Disp: 360 tablet, Rfl: 1   icosapent Ethyl (VASCEPA) 1 g capsule, Take 2 capsules (2 g total) by mouth 2 (two) times daily., Disp: 360 capsule, Rfl: 3   insulin glargine (LANTUS SOLOSTAR) 100 UNIT/ML Solostar Pen, Inject 28 Units into the skin daily., Disp: 30 mL, Rfl: 3   Insulin Pen Needle 32G X 4 MM MISC, 1 Device by Does not apply route daily in the afternoon., Disp: 100 each,  Rfl: 3   metFORMIN (GLUCOPHAGE) 1000 MG tablet, Take 1 tablet (1,000 mg total) by mouth 2 (two) times daily with a meal., Disp: 180 tablet, Rfl: 3   traMADol (ULTRAM) 50 MG tablet, Take 50 mg by mouth 2 (two) times daily as needed., Disp: , Rfl:    trazodone (DESYREL) 300 MG tablet, Take 300 mg by mouth at bedtime., Disp: , Rfl:    valbenazine (INGREZZA) 80 MG capsule, Take 1 capsule (80 mg total) by mouth daily., Disp: 30 capsule, Rfl: 0   ARIPiprazole (ABILIFY) 20 MG tablet, Take 1 tablet (20 mg total) by mouth daily., Disp: 90 tablet, Rfl: 1   atomoxetine (STRATTERA) 100 MG capsule, Take 1 capsule (100 mg total) by mouth daily., Disp: 90 capsule, Rfl: 1   DULoxetine (CYMBALTA) 60 MG capsule, Take 2 capsules (120 mg total) by mouth daily., Disp: 180 capsule, Rfl: 1   nicotine (NICODERM CQ - DOSED IN MG/24 HOURS) 21 mg/24hr patch, Place 1 patch (21 mg total) onto the skin daily. (Patient not taking: Reported on 02/18/2023), Disp: 28 patch, Rfl: 3   nicotine polacrilex ([employer] NICOTINE) 4 MG gum, Take 1 each (4 mg total) by mouth as needed for smoking cessation. (Patient not taking: Reported on 02/18/2023), Disp: 100 tablet, Rfl: 1   viloxazine ER (QELBREE) 200 MG 24 hr capsule, Take 3 capsules (600 mg total) by mouth daily. (Patient not taking: Reported on 03/18/2023), Disp: 90 capsule, Rfl: 1 Medication Side Effects: none  Family Medical/ Social History: Changes? no  MENTAL HEALTH EXAM:  There were no vitals taken for this visit.There is no height or weight on file to calculate BMI.  General Appearance: Casual and Well Groomed  Eye Contact:  Good  Speech:  Clear and Coherent and Normal Rate  Volume:  Normal  Mood:  Euthymic  Affect:  Congruent  Thought Process:  Goal Directed and Descriptions of Associations: Circumstantial  Orientation:  Full (Time, Place, and Person)  Thought Content: Logical   Suicidal Thoughts:  No  Homicidal Thoughts:  No  Memory:  WNL  Judgement:  Fair  Insight:   Good  Psychomotor Activity:  Normal  Concentration:  Concentration: Good and Attention Span: Fair  Recall:  Good  Fund of Knowledge: Good  Language: Good  Assets:  Desire for Improvement Financial Resources/Insurance Housing Transportation  ADL's:  Intact  Cognition: WNL  Prognosis:  Good          AIMS    Flowsheet Row Office Visit from 03/18/2023 in [facility] Office Visit from 02/18/2023 in [facility] Office Visit from 05/30/2022 in [facility] Office Visit from 04/20/2022 in [facility] Office Visit from 03/21/2022 in [facility]  AIMS Total Score 2 8 3 7 17       PHQ2-9    Flowsheet Row CARDIAC REHAB PHASE II ORIENTATION from 08/16/2020 in [facility]  PHQ-2 Total Score 0      Flowsheet Row ED to Hosp-Admission (Discharged) from 06/30/2022 in [facility] ED to Hosp-Admission (Discharged) from 06/01/2022 in [facility] 24M KIDNEY UNIT Admission (Discharged) from 11/28/2021 in [place] PERIOPERATIVE AREA  C-SSRS RISK CATEGORY No Risk No Risk No Risk       DIAGNOSES:    ICD-10-CM   1. Attention deficit hyperactivity disorder (ADHD), combined type, moderate  F90.2     2. Schizoaffective disorder, bipolar type (HCC)  F25.0     3. Generalized anxiety disorder  F41.1     4. Tardive dyskinesia  G24.01     5. Smoker  F17.200       Receiving Psychotherapy: No   RECOMMENDATIONS:  PDMP reviewed.  Gabapentin filled 03/05/2023.  Also on tramadol.   I provided 26 minutes of face to face time during this encounter, including time spent before and after the visit in records review, medical decision making, counseling pertinent to today's visit, and charting.   I provided approximately 16 minutes in counseling for smoking cessation.  He is not ready  to quit.  He is doing well back on the Strattera so no changes will be made.  Continue Abilify 20 mg, 1 p.o. daily. Continue Strattera 100 mg, 1 p.o. daily. Continue Cymbalta 60 mg, 2 p.o. daily. Continue gabapentin 800 mg 1 p.o. 4 times daily with 1/2 pill mid day.  Continue hydroxyzine 50 mg, 1 p.o. 4 times daily as needed.   Continue trazodone 300 mg, 1 p.o. nightly as needed. Continue Ingrezza 80 mg daily.  Strongly recommend counseling. Return in 3 months.  Melony Overly, PA-C

## 2023-03-25 ENCOUNTER — Encounter: Payer: Self-pay | Admitting: Internal Medicine

## 2023-03-25 ENCOUNTER — Telehealth: Payer: Self-pay

## 2023-03-25 ENCOUNTER — Ambulatory Visit (INDEPENDENT_AMBULATORY_CARE_PROVIDER_SITE_OTHER): Payer: Medicare HMO | Admitting: Internal Medicine

## 2023-03-25 VITALS — BP 124/80 | HR 100 | Ht 75.0 in | Wt 272.0 lb

## 2023-03-25 DIAGNOSIS — E1142 Type 2 diabetes mellitus with diabetic polyneuropathy: Secondary | ICD-10-CM

## 2023-03-25 DIAGNOSIS — Z794 Long term (current) use of insulin: Secondary | ICD-10-CM | POA: Diagnosis not present

## 2023-03-25 DIAGNOSIS — E1165 Type 2 diabetes mellitus with hyperglycemia: Secondary | ICD-10-CM | POA: Diagnosis not present

## 2023-03-25 LAB — POCT GLYCOSYLATED HEMOGLOBIN (HGB A1C): Hemoglobin A1C: 9.4 % — AB (ref 4.0–5.6)

## 2023-03-25 LAB — POCT GLUCOSE (DEVICE FOR HOME USE): POC Glucose: 254 mg/dL — AB (ref 70–99)

## 2023-03-25 NOTE — Patient Instructions (Signed)
-   Start Ozempic 0.25 mg once weekly for 6 weeks, than increase to 0.5 mg weekly  - Continue Metformin 1000 mg , 1 tablet before Breakfast and 1 tablet before Supper  - Continue  Jardiance 25 mg, 1 tablet before Breakfast  - Continue  Lantus 28  units once daily      HOW TO TREAT LOW BLOOD SUGARS (Blood sugar LESS THAN 70 MG/DL) Please follow the RULE OF 15 for the treatment of hypoglycemia treatment (when your (blood sugars are less than 70 mg/dL)   STEP 1: Take 15 grams of carbohydrates when your blood sugar is low, which includes:  3-4 GLUCOSE TABS  OR 3-4 OZ OF JUICE OR REGULAR SODA OR ONE TUBE OF GLUCOSE GEL    STEP 2: RECHECK blood sugar in 15 MINUTES STEP 3: If your blood sugar is still low at the 15 minute recheck --> then, go back to STEP 1 and treat AGAIN with another 15 grams of carbohydrates.

## 2023-03-25 NOTE — Progress Notes (Signed)
Name: Matthew [name]  Age/ Sex: 47 y.o., male   MRN/ DOB: [id number], 1975/10/02     PCP: Patient, No Pcp Per   Reason for Endocrinology Evaluation: Type 2 Diabetes Mellitus  Initial Endocrine Consultative Visit: 08/15/2020    PATIENT IDENTIFIER: Matthew [name] is a 47 y.o. male with a past medical history of T2DN, CAD , OSA and schizoaffective d/o. The patient has followed with Endocrinology clinic since 08/15/2020 for consultative assistance with management of his diabetes.  DIABETIC HISTORY:  Matthew [name] was diagnosed with DM 2021,he has been on metformin and jardiance . His hemoglobin A1c has ranged from 6.4% in 2020, peaking at 12.8% in 2022.   Pt on disability for mental health     SUBJECTIVE:   During the last visit (09/21/2022): A1c 7.6 %  Today (03/25/2023): Matthew [name] is here for a diabetes management.  He checks his blood sugars multiple  times daily, through CGM.    Patient continues to follow-up with psychiatry Patient continues to follow-up with cardiology for CAD, ischemic cardiomyopathy and dyslipidemia    Denies nausea, or vomiting  Denies constipation or diarrhea   HOME DIABETES REGIMEN:  Metformin 1000 mg , 1 tablet before Breakfast and 1 tablet before Supper Jardiance 25 mg, 1 tablet before Breakfast Lantus  28 units daily      Statin: yes ACE-I/ARB: yes   CONTINUOUS GLUCOSE MONITORING RECORD INTERPRETATION : n/a    DIABETIC COMPLICATIONS: Microvascular complications:  Neuropathy Denies: CKD, retinopathy Last Eye Exam: schedule in 2 months   Macrovascular complications:  CAD Denies: CVA, PVD   HISTORY:  Past Medical History:  Past Medical History:  Diagnosis Date   Anxiety    Arthritis    Celiac disease    Coronary artery disease    Dairy allergy    Depression    Diabetes mellitus without complication (HCC)    High cholesterol    Hypertension    Hypothyroidism    Myocardial infarction (HCC)    Paranoia (HCC)     Schizoaffective disorder (HCC)    Past Surgical History:  Past Surgical History:  Procedure Laterality Date   APPENDECTOMY     CARDIAC CATHETERIZATION     CORONARY STENT INTERVENTION N/A 06/06/2020   Procedure: CORONARY STENT INTERVENTION;  Surgeon: Swaziland, Peter M, MD;  Location: [facility] INVASIVE CV LAB;  Service: Cardiovascular;  Laterality: N/A;  prox LAD, distal RCA   CORONARY ULTRASOUND/IVUS N/A 06/06/2020   Procedure: Intravascular Ultrasound/IVUS;  Surgeon: Swaziland, Peter M, MD;  Location: [facility] CV LAB;  Service: Cardiovascular;  Laterality: N/A;   LEFT HEART CATH AND CORONARY ANGIOGRAPHY N/A 06/06/2020   Procedure: LEFT HEART CATH AND CORONARY ANGIOGRAPHY;  Surgeon: Swaziland, Peter M, MD;  Location: [facility] INVASIVE CV LAB;  Service: Cardiovascular;  Laterality: N/A;   LEFT HEART CATH AND CORONARY ANGIOGRAPHY N/A 02/24/2021   Procedure: LEFT HEART CATH AND CORONARY ANGIOGRAPHY;  Surgeon: Lyn Records, MD;  Location: [facility] INVASIVE CV LAB;  Service: Cardiovascular;  Laterality: N/A;   NASAL SEPTUM SURGERY     TOTAL SHOULDER ARTHROPLASTY Right 11/28/2021   Procedure: TOTAL SHOULDER ARTHROPLASTY;  Surgeon: Teryl Lucy, MD;  Location: [facility] ORS;  Service: Orthopedics;  Laterality: Right;   WRIST FRACTURE SURGERY     right wrist   Social History:  reports that he has been smoking cigarettes. He has a 1 pack-year smoking history. He has never used smokeless tobacco. He reports that he does not drink alcohol and does not use  drugs. Family History:  Family History  Problem Relation Age of Onset   Asthma Mother    Hypertension Mother    Hypertension Father      HOME MEDICATIONS: Allergies as of 03/25/2023       Reactions   Gluten Meal Other (See Comments)   Celiac disease   Lactose Intolerance (gi) Diarrhea   Can tolerate hard cheese        Medication List        Accurate as of March 25, 2023 12:13 PM. If you have any questions, ask your nurse or doctor.           ARIPiprazole 20 MG tablet Commonly known as: ABILIFY Take 1 tablet (20 mg total) by mouth daily.   aspirin EC 81 MG tablet Take 1 tablet (81 mg total) by mouth daily. Swallow whole.   atomoxetine 100 MG capsule Commonly known as: STRATTERA Take 1 capsule (100 mg total) by mouth daily.   atorvastatin 80 MG tablet Commonly known as: LIPITOR Take 1 tablet (80 mg total) by mouth daily.   carvedilol 12.5 MG tablet Commonly known as: COREG Take 3 tablets (37.5 mg total) by mouth 2 (two) times daily.   clopidogrel 75 MG tablet Commonly known as: PLAVIX Take 1 tablet (75 mg total) by mouth daily.   DULoxetine 60 MG capsule Commonly known as: Cymbalta Take 2 capsules (120 mg total) by mouth daily.   empagliflozin 25 MG Tabs tablet Commonly known as: Jardiance Take 1 tablet (25 mg total) by mouth every morning.   fenofibrate 145 MG tablet Commonly known as: Tricor Take 1 tablet (145 mg total) by mouth daily.   gabapentin 800 MG tablet Commonly known as: NEURONTIN Take 1 tablet by mouth 3 (three) times daily.   gabapentin 800 MG tablet Commonly known as: NEURONTIN TAKE 1 TABLET BY MOUTH 4 TIMES A DAY WITH AN EXTRA 1/2 TABLET DURING THE DAY AS NEEDED   hydrOXYzine 50 MG tablet Commonly known as: ATARAX TAKE 1 TABLET BY MOUTH EVERY 6 HOURS AS NEEDED.   icosapent Ethyl 1 g capsule Commonly known as: Vascepa Take 2 capsules (2 g total) by mouth 2 (two) times daily.   Insulin Pen Needle 32G X 4 MM Misc 1 Device by Does not apply route daily in the afternoon.   Lantus SoloStar 100 UNIT/ML Solostar Pen Generic drug: insulin glargine Inject 28 Units into the skin daily.   metFORMIN 1000 MG tablet Commonly known as: GLUCOPHAGE Take 1 tablet (1,000 mg total) by mouth 2 (two) times daily with a meal.   Nexlizet 180-10 MG Tabs Generic drug: Bempedoic Acid-Ezetimibe Take 1 tablet by mouth daily.   nicotine 21 mg/24hr patch Commonly known as: NICODERM CQ - dosed in mg/24  hours Place 1 patch (21 mg total) onto the skin daily.   nicotine polacrilex 4 MG gum Commonly known as: [facility] Nicotine Take 1 each (4 mg total) by mouth as needed for smoking cessation.   sulfamethoxazole-trimethoprim 800-160 MG tablet Commonly known as: BACTRIM DS Take 1 tablet by mouth 2 (two) times daily. for 10 days   traMADol 50 MG tablet Commonly known as: ULTRAM Take 50 mg by mouth 2 (two) times daily as needed.   traMADol 50 MG tablet Commonly known as: ULTRAM 1 tablet as needed Orally twice a day for 7 days   trazodone 300 MG tablet Commonly known as: DESYREL Take 300 mg by mouth at bedtime.   traZODone 150 MG tablet Commonly known as: DESYREL  Take 300 mg by mouth at bedtime.   valbenazine 80 MG capsule Commonly known as: INGREZZA Take 1 capsule (80 mg total) by mouth daily.   viloxazine ER 200 MG 24 hr capsule Commonly known as: Qelbree Take 3 capsules (600 mg total) by mouth daily.         OBJECTIVE:   Vital Signs: BP 124/80 (BP Location: Left Arm, Patient Position: Sitting, Cuff Size: Large)   Pulse 100   Ht 6\' 3"  (1.905 m)   Wt 272 lb (123.4 kg)   SpO2 95%   BMI 34.00 kg/m   Wt Readings from Last 3 Encounters:  03/25/23 272 lb (123.4 kg)  02/11/23 269 lb (122 kg)  09/21/22 273 lb (123.8 kg)     Exam: General: Pt appears well and is in NAD  Lungs: Clear with good BS bilat   Heart: RRR   Abdomen: soft, nontender  Extremities: No pretibial edema.   Neuro: MS is good with appropriate affect, pt is alert and Ox3    DM foot exam: 09/21/2022 The skin of the feet is without sores or ulcerations, callous formation at the heels The pedal pulses are undetectable  The sensation is intact to a screening 5.07, 10 gram monofilament bilaterally       DATA REVIEWED:  Lab Results  Component Value Date   HGBA1C 7.6 (A) 09/21/2022   HGBA1C 8.3 (H) 06/30/2022   HGBA1C 8.8 (A) 03/19/2022    Latest Reference Range & Units 02/08/23 08:32  Sodium 134  - 144 mmol/L 141  Potassium 3.5 - 5.2 mmol/L 4.1  Chloride 96 - 106 mmol/L 103  CO2 20 - 29 mmol/L 22  Glucose 70 - 99 mg/dL 161 (H)  BUN 6 - 24 mg/dL 13  Creatinine 0.96 - 0.45 mg/dL 4.09  Calcium 8.7 - 81.1 mg/dL 9.7  BUN/Creatinine Ratio 9 - 20  12  eGFR >59 mL/min/1.73 89  Alkaline Phosphatase 44 - 121 IU/L 102  Albumin 4.1 - 5.1 g/dL 4.6  AST 0 - 40 IU/L 49 (H)  ALT 0 - 44 IU/L 103 (H)  Total Protein 6.0 - 8.5 g/dL 7.1  Total Bilirubin 0.0 - 1.2 mg/dL 0.4    Latest Reference Range & Units 02/08/23 08:32  Total CHOL/HDL Ratio 0.0 - 5.0 ratio 91.4 (H)  Cholesterol, Total 100 - 199 mg/dL 782  HDL Cholesterol >95 mg/dL 16 (L)  Direct LDL 0 - 99 mg/dL 49  Triglycerides 0 - 621 mg/dL 308 (HH)  VLDL Cholesterol Cal 5 - 40 mg/dL 657 (H)  LDL Chol Calc (NIH) 0 - 99 mg/dL 63  LDL CALC COMMENT:  Comment    Latest Reference Range & Units 09/21/22 16:36  Microalb, Ur mg/dL 0.2  MICROALB/CREAT RATIO <30 mg/g creat 4  Creatinine, Urine 20 - 320 mg/dL 46   In office BG 846 mg/dL   ASSESSMENT / PLAN / RECOMMENDATIONS:   1) Type 2 Diabetes Mellitus, Poorly  controlled, With neuropathic  complications - Most recent A1c of 9.4 %. Goal A1c < 7.0 %.     -Patient has been noted with worsening glycemic control, A1c increased from 7.6% to 9.4% -This is due to dietary indiscretion as well as medication nonadherence -I have recommended adding GLP-1 agonist, he has no history of pancreatitis, caution against GI side effects -Barriers to diabetes self-care is mental health issues as well as social determinants.  Patient has not been able to obtain CGM technology due to cost  MEDICATIONS: Continue Metformin 1000 mg ,  1 tablet before Breakfast and 1 tablet before Supper  Continue  Jardiance 25 mg, 1 tablet before Breakfast  Continue Lantus  28  units once daily  Start Ozempic 0.25 mg once weekly for 6 weeks, then increase to 0.5 mg weekly  EDUCATION / INSTRUCTIONS: BG monitoring  instructions: Patient is instructed to check his blood sugars 1 times a day. Call [place] [facility] if: BG persistently < 70  I reviewed the Rule of 15 for the treatment of hypoglycemia in detail with the patient. Literature supplied.   2) Diabetic complications:  Eye: Does not have known diabetic retinopathy.  Neuro/ Feet: Does not have known diabetic peripheral neuropathy .  Renal: Patient does not have known baseline CKD. He   is  on an ACEI/ARB at present.   3) Hypertriglyceridemia   - Per cardiology      F/U in 6 months     Signed electronically by: Lyndle Herrlich, MD  [facility] Endocrinology  [facility] [address] [place], [place] [address] Phone: [phone] FAX: [phone]   CC: Patient, No Pcp Per No address on file Phone: None  Fax: None  Return to Endocrinology clinic as below: Future Appointments  Date Time Provider Department Center  06/06/2023  1:20 PM Quintella Reichert, MD [facility]  06/18/2023 11:00 AM Claybon Jabs, [place] Nurse, PA-C [facility] None

## 2023-03-25 NOTE — Telephone Encounter (Signed)
Medication Samples have been provided to the patient.  Drug name: Ozempic       Strength: 0.25mg         Qty: 1 pen  LOT: [id number]  Exp.Date: 06/20/2024  Dosing instructions: Inject once weekly   The patient has been instructed regarding the correct time, dose, and frequency of taking this medication, including desired effects and most common side effects.   Breven Guidroz L Takima [place]ina 12:27 PM 03/25/2023

## 2023-04-05 ENCOUNTER — Encounter: Payer: Self-pay | Admitting: Cardiology

## 2023-04-05 ENCOUNTER — Ambulatory Visit: Payer: Medicare HMO | Attending: Cardiology | Admitting: Cardiology

## 2023-04-05 ENCOUNTER — Other Ambulatory Visit: Payer: Self-pay | Admitting: Physician Assistant

## 2023-04-05 VITALS — BP 120/74 | HR 101 | Resp 16 | Ht 75.0 in | Wt 271.0 lb

## 2023-04-05 DIAGNOSIS — G4733 Obstructive sleep apnea (adult) (pediatric): Secondary | ICD-10-CM

## 2023-04-05 DIAGNOSIS — I25118 Atherosclerotic heart disease of native coronary artery with other forms of angina pectoris: Secondary | ICD-10-CM

## 2023-04-05 DIAGNOSIS — I255 Ischemic cardiomyopathy: Secondary | ICD-10-CM

## 2023-04-05 DIAGNOSIS — R55 Syncope and collapse: Secondary | ICD-10-CM

## 2023-04-05 DIAGNOSIS — E782 Mixed hyperlipidemia: Secondary | ICD-10-CM

## 2023-04-05 NOTE — Progress Notes (Signed)
Cardiology Office Note   Date:  04/05/2023   ID:  Matthew [name], DOB January 07, 1976, MRN [id number]  PCP:  Patient, No Pcp Per      Wt Readings from Last 3 Encounters:  04/05/23 271 lb (122.9 kg)  03/25/23 272 lb (123.4 kg)  02/11/23 269 lb (122 kg)       History of Present Illness: Matthew [name] is a 47 y.o. male former patient of Dr. Shari Prows and Dr. Eldridge Dace with a hx of HTN, HLD, pre-DM, celiac dz, schizoaffective d/o, anxiety, ADHD, OSA not on CPAP, and NSTEMI in 2022 s/p DES to pLAD and DES to dRCA who presents to clinic for follow-up.   Patient admitted with NSTEMI 06/06/20 treated with DES pLAD and DES dRCA, and severe LVEF 25-30%. Patient left AMA 06/08/19 and was seen urgently in clinic by Jacolyn Reedy. During that visit, he was more SOB and had been eating a lot of fast food and had not filled his scripts for atenolol or lisinopril.  06/2020 Cardiac monitor showed: rare ectopy with no significant arrhythmias or ectopy.   Was admitted on 10/07-10/08/22 for chest pain. Cath showed patent LAD stent, 25% Lcx, 25-45% prox RCA disease, anterior hypokinesis with EF 50%, LVEDP . Recommended for continued medical management.   Readmitted 04/23/21 for chest pain and syncope. ECG nonischemic. Trop negative. TTE with LVEF 60-65%, normal RV, no significant valve disease. Symptoms thought to be vagal in nature as he fainted with coughing. Discharged home.  In 2023, it was noted he quit smoking with the help of a nicotine patch.  He was working on his diabetes at that time when A1c was 8.1.  He is here today for followup and is doing well.  He denies any chest pain or pressure, SOB, DOE, PND, orthopnea, LE edema, dizziness, palpitations or syncope. He is compliant with his meds and is tolerating meds with no SE.    He has a hx of moderate OSA with AHI 30/hr by HST in 2016 and severe O2 desats.  He tried CPAP but could not tolerate it. He only tried 1 mask.  He sleeps on his side  and the mask would not stay on his face. He is a mouth breather.  He feels very tired in the morning and sleeps very poorly at night.  He has to nap during the day to make it through.  He wakes himself up snoring and gasping for breath at times.   Past Medical History:  Diagnosis Date   Anxiety    Arthritis    Celiac disease    Coronary artery disease    Dairy allergy    Depression    Diabetes mellitus without complication (HCC)    High cholesterol    Hypertension    Hypothyroidism    Myocardial infarction (HCC)    OSA (obstructive sleep apnea)    Paranoia (HCC)    Schizoaffective disorder (HCC)     Past Surgical History:  Procedure Laterality Date   APPENDECTOMY     CARDIAC CATHETERIZATION     CORONARY STENT INTERVENTION N/A 06/06/2020   Procedure: CORONARY STENT INTERVENTION;  Surgeon: Swaziland, Peter M, MD;  Location: [facility] INVASIVE CV LAB;  Service: Cardiovascular;  Laterality: N/A;  prox LAD, distal RCA   CORONARY ULTRASOUND/IVUS N/A 06/06/2020   Procedure: Intravascular Ultrasound/IVUS;  Surgeon: Swaziland, Peter M, MD;  Location: [facility] INVASIVE CV LAB;  Service: Cardiovascular;  Laterality: N/A;   LEFT HEART CATH AND CORONARY ANGIOGRAPHY N/A 06/06/2020  Procedure: LEFT HEART CATH AND CORONARY ANGIOGRAPHY;  Surgeon: Swaziland, Peter M, MD;  Location: [facility] INVASIVE CV LAB;  Service: Cardiovascular;  Laterality: N/A;   LEFT HEART CATH AND CORONARY ANGIOGRAPHY N/A 02/24/2021   Procedure: LEFT HEART CATH AND CORONARY ANGIOGRAPHY;  Surgeon: Lyn Records, MD;  Location: [facility] INVASIVE CV LAB;  Service: Cardiovascular;  Laterality: N/A;   NASAL SEPTUM SURGERY     TOTAL SHOULDER ARTHROPLASTY Right 11/28/2021   Procedure: TOTAL SHOULDER ARTHROPLASTY;  Surgeon: Teryl Lucy, MD;  Location: [facility] ORS;  Service: Orthopedics;  Laterality: Right;   WRIST FRACTURE SURGERY     right wrist     Current Outpatient Medications  Medication Sig Dispense Refill   ARIPiprazole (ABILIFY) 20 MG tablet Take 1  tablet (20 mg total) by mouth daily. 90 tablet 1   aspirin EC 81 MG tablet Take 1 tablet (81 mg total) by mouth daily. Swallow whole. 30 tablet 0   atomoxetine (STRATTERA) 100 MG capsule Take 1 capsule (100 mg total) by mouth daily. 90 capsule 1   atorvastatin (LIPITOR) 80 MG tablet Take 1 tablet (80 mg total) by mouth daily. 90 tablet 2   Bempedoic Acid-Ezetimibe (NEXLIZET) 180-10 MG TABS Take 1 tablet by mouth daily. 90 tablet 3   carvedilol (COREG) 12.5 MG tablet Take 3 tablets (37.5 mg total) by mouth 2 (two) times daily. 540 tablet 2   clopidogrel (PLAVIX) 75 MG tablet Take 1 tablet (75 mg total) by mouth daily. 90 tablet 3   DULoxetine (CYMBALTA) 60 MG capsule Take 2 capsules (120 mg total) by mouth daily. 180 capsule 1   empagliflozin (JARDIANCE) 25 MG TABS tablet Take 1 tablet (25 mg total) by mouth every morning. 90 tablet 3   fenofibrate (TRICOR) 145 MG tablet Take 1 tablet (145 mg total) by mouth daily. 90 tablet 3   gabapentin (NEURONTIN) 800 MG tablet TAKE 1 TABLET BY MOUTH 4 TIMES A DAY WITH AN EXTRA 1/2 TABLET DURING THE DAY AS NEEDED 135 tablet 0   hydrOXYzine (ATARAX) 50 MG tablet TAKE 1 TABLET BY MOUTH EVERY 6 HOURS AS NEEDED. 360 tablet 1   icosapent Ethyl (VASCEPA) 1 g capsule Take 2 capsules (2 g total) by mouth 2 (two) times daily. 360 capsule 3   insulin glargine (LANTUS SOLOSTAR) 100 UNIT/ML Solostar Pen Inject 28 Units into the skin daily. 30 mL 3   Insulin Pen Needle 32G X 4 MM MISC 1 Device by Does not apply route daily in the afternoon. 100 each 3   metFORMIN (GLUCOPHAGE) 1000 MG tablet Take 1 tablet (1,000 mg total) by mouth 2 (two) times daily with a meal. 180 tablet 3   nicotine (NICODERM CQ - DOSED IN MG/24 HOURS) 21 mg/24hr patch Place 1 patch (21 mg total) onto the skin daily. 28 patch 3   nicotine polacrilex ([employer] NICOTINE) 4 MG gum Take 1 each (4 mg total) by mouth as needed for smoking cessation. 100 tablet 1   Semaglutide,0.25 or 0.5MG /DOS, (OZEMPIC, 0.25 OR 0.5  MG/DOSE,) 2 MG/1.5ML SOPN Inject 0.25 mg into the skin once a week.     sulfamethoxazole-trimethoprim (BACTRIM DS) 800-160 MG tablet Take 1 tablet by mouth 2 (two) times daily. for 10 days     traMADol (ULTRAM) 50 MG tablet Take 50 mg by mouth 2 (two) times daily as needed.     traMADol (ULTRAM) 50 MG tablet 1 tablet as needed Orally twice a day for 7 days     trazodone (DESYREL) 300  MG tablet Take 300 mg by mouth at bedtime.     valbenazine (INGREZZA) 80 MG capsule Take 1 capsule (80 mg total) by mouth daily. 30 capsule 0   No current facility-administered medications for this visit.    Allergies:   Gluten meal and Lactose intolerance (gi)    Social History:  The patient  reports that he has been smoking cigarettes. He has a 1 pack-year smoking history. He has never used smokeless tobacco. He reports that he does not drink alcohol and does not use drugs.   Family History:  The patient's family history includes Asthma in his mother; Hypertension in his father and mother.    ROS:  Please see the history of present illness.   Otherwise, review of systems are positive for .   All other systems are reviewed and negative.    PHYSICAL EXAM: VS:  BP 120/74   Pulse (!) 101   Resp 16   Ht 6\' 3"  (1.905 m)   Wt 271 lb (122.9 kg)   SpO2 93%   BMI 33.87 kg/m  , BMI Body mass index is 33.87 kg/m. GEN: Well nourished, well developed in no acute distress HEENT: Normal NECK: No JVD; No carotid bruits LYMPHATICS: No lymphadenopathy CARDIAC:RRR, no murmurs, rubs, gallops RESPIRATORY:  Clear to auscultation without rales, wheezing or rhonchi  ABDOMEN: Soft, non-tender, non-distended MUSCULOSKELETAL:  No edema; No deformity  SKIN: Warm and dry NEUROLOGIC:  Alert and oriented x 3 PSYCHIATRIC:  Normal affect     Recent Labs: 06/30/2022: TSH 0.498 07/01/2022: Hemoglobin 15.2; Platelets 247 02/08/2023: ALT 103; BUN 13; Creatinine, Ser 1.05; Potassium 4.1; Sodium 141   Lipid Panel    Component  Value Date/Time   CHOL 183 02/08/2023 0832   TRIG 683 (HH) 02/08/2023 0832   HDL 16 (L) 02/08/2023 0832   CHOLHDL 11.4 (H) 02/08/2023 0832   CHOLHDL 7.2 07/04/2022 0634   VLDL 49 (H) 07/04/2022 0634   LDLCALC 63 02/08/2023 0832   LDLDIRECT 49 02/08/2023 0832   LDLDIRECT 42.4 02/25/2021 0139     Other studies Reviewed: Additional studies/ records that were reviewed today with results demonstrating: LDL 49 in September 2024 but triglycerides 603.  Partially nonfasting- had just eaten a little.   ASSESSMENT AND PLAN:  CAD:  -Multivessel PCI in 2022.  - LVEF has improved to the normal range.   -Stable disease with patent stents on repeat cath in October 2022. -denies any anginal sx -continue prescription drug management with ASA 81mg  daily, atorvastatin 80mg  daily, Plavix 75mg  daily, Carvedilol 12.5mg  BID with PRN refills  Ischemic cardiomyopathy:  -Resolved.   -Continue medical therapy with Carvedilol 12.5mg  BID, Jardiance 25mg  daily with PRN refills  Hyperlipidemia:  -High-dose statin and Vascepa along with fenofibrate have been prescribed to manage lipids and triglycerides.  -I have personally reviewed and interpreted outside labs performed by patient's PCP which showed LDL 63, TAGs 683, HDL 16 -repeat FLP and ALT -continue prescription drug management with Atorvastatin 80mg  daily, Nexlizet 180-10mg  daily and Vascepa 2gm BID and fenofibrate 145mg  daily with PRN refills -he has started Ozempic recently and BS is much better controlled which showed help with his TAGs  Several episodes of syncope.   -Resolved after stopping lisinopril for cough.   -Cough stimulated an episode of syncope at 1 point.  OSA:  -Cannot tolerate CPAP due to sleeping position.   -will repeat PSG to see if he qualifies for the Inspire device  Tobacco abuse:  -started smoking smoking again  but has cut back to <1ppd   Current medicines are reviewed at length with the patient today.  The patient  concerns regarding his medicines were addressed.  Labs/ tests ordered today include:  No orders of the defined types were placed in this encounter.   Recommend 150 minutes/week of aerobic exercise Low fat, low carb, high fiber diet recommended  Disposition:   FU with me in 1 year   Signed, Armanda Magic, MD  04/05/2023 10:33 AM    [facility] [address] [place], [place] [address], [place]  [address] Phone: [phone]; Fax: [phone]

## 2023-04-05 NOTE — Patient Instructions (Signed)
Medication Instructions:  Your physician recommends that you continue on your current medications as directed. Please refer to the Current Medication list given to you today.  *If you need a refill on your cardiac medications before your next appointment, please call your pharmacy*   Lab Work: Please return to our lab sometime in the next 2 weeks when you have been fasting so you can complete a lipid panel and an ALT.  If you have labs (blood work) drawn today and your tests are completely normal, you will receive your results only by: MyChart Message (if you have MyChart) OR A paper copy in the mail If you have any lab test that is abnormal or we need to change your treatment, we will call you to review the results.   Testing/Procedures: Your physician has recommended that you have a split night sleep study. This test records several body functions during sleep, including: brain activity, eye movement, oxygen and carbon dioxide blood levels, heart rate and rhythm, breathing rate and rhythm, the flow of air through your mouth and nose, snoring, body muscle movements, and chest and belly movement. Someone will call you to schedule this when your insurance approves the test.    Follow-Up: At [facility], you and your health needs are our priority.  As part of our continuing mission to provide you with exceptional heart care, we have created designated Provider Care Teams.  These Care Teams include your primary Cardiologist (physician) and Advanced Practice Providers (APPs -  Physician Assistants and Nurse Practitioners) who all work together to provide you with the care you need, when you need it.  We recommend signing up for the patient portal called "MyChart".  Sign up information is provided on this After Visit Summary.  MyChart is used to connect with patients for Virtual Visits (Telemedicine).  Patients are able to view lab/test results, encounter notes, upcoming appointments,  etc.  Non-urgent messages can be sent to your provider as well.   To learn more about what you can do with MyChart, go to [email or web address].    Your next appointment:   1 year(s)  Provider:   Dr. Armanda Magic, MD

## 2023-04-05 NOTE — Addendum Note (Signed)
Addended by: Luellen Pucker on: 04/05/2023 10:42 AM   Modules accepted: Orders

## 2023-04-17 ENCOUNTER — Other Ambulatory Visit: Payer: Self-pay | Admitting: Physician Assistant

## 2023-04-17 MED ORDER — TRAZODONE HCL 300 MG PO TABS: 300.0000 mg | ORAL_TABLET | Freq: Every day | ORAL | 2 refills | Status: DC

## 2023-05-01 ENCOUNTER — Other Ambulatory Visit: Payer: Self-pay

## 2023-05-01 MED ORDER — VALBENAZINE TOSYLATE 80 MG PO CAPS: 80.0000 mg | ORAL_CAPSULE | Freq: Every day | ORAL | 1 refills | Status: DC

## 2023-05-02 ENCOUNTER — Other Ambulatory Visit (HOSPITAL_COMMUNITY): Payer: Self-pay

## 2023-05-02 ENCOUNTER — Telehealth: Payer: Self-pay | Admitting: Pharmacy Technician

## 2023-05-02 NOTE — Telephone Encounter (Signed)
Pharmacy Patient Advocate Encounter   Received notification from CoverMyMeds that prior authorization for nexlizet is required/requested.   Insurance verification completed.   The patient is insured through [employer] .   Per test claim: Refill too soon. PA is not needed at this time. Medication was filled 04/29/23. Next eligible fill date is 05/24/23. Prior auth not needed at this time  02/19/23- 05/20/24

## 2023-05-06 ENCOUNTER — Other Ambulatory Visit: Payer: Self-pay

## 2023-05-06 MED ORDER — OZEMPIC (0.25 OR 0.5 MG/DOSE) 2 MG/1.5ML ~~LOC~~ SOPN: 0.5000 mg | PEN_INJECTOR | SUBCUTANEOUS | 1 refills | Status: DC

## 2023-05-12 ENCOUNTER — Other Ambulatory Visit: Payer: Self-pay | Admitting: Physician Assistant

## 2023-06-06 ENCOUNTER — Other Ambulatory Visit: Payer: Self-pay

## 2023-06-06 ENCOUNTER — Ambulatory Visit: Payer: Medicare HMO | Admitting: Cardiology

## 2023-06-06 MED ORDER — VALBENAZINE TOSYLATE 80 MG PO CAPS: 80.0000 mg | ORAL_CAPSULE | Freq: Every day | ORAL | 1 refills | Status: DC

## 2023-06-13 ENCOUNTER — Telehealth: Payer: Self-pay

## 2023-06-13 NOTE — Telephone Encounter (Signed)
Prior Authorization submitted for Ingrezza 80 mg with [employer], pending response

## 2023-06-14 NOTE — Telephone Encounter (Signed)
Prior approval received for Ingrezza 80 mg with [facility] Medicare Part D effective through 05/20/24. ID# [id number]

## 2023-06-17 NOTE — Telephone Encounter (Signed)
Noted

## 2023-06-18 ENCOUNTER — Ambulatory Visit (INDEPENDENT_AMBULATORY_CARE_PROVIDER_SITE_OTHER): Payer: Medicare HMO | Admitting: Physician Assistant

## 2023-06-18 ENCOUNTER — Encounter: Payer: Self-pay | Admitting: Physician Assistant

## 2023-06-18 DIAGNOSIS — F250 Schizoaffective disorder, bipolar type: Secondary | ICD-10-CM | POA: Diagnosis not present

## 2023-06-18 DIAGNOSIS — F411 Generalized anxiety disorder: Secondary | ICD-10-CM

## 2023-06-18 DIAGNOSIS — F902 Attention-deficit hyperactivity disorder, combined type: Secondary | ICD-10-CM | POA: Diagnosis not present

## 2023-06-18 DIAGNOSIS — G2401 Drug induced subacute dyskinesia: Secondary | ICD-10-CM | POA: Diagnosis not present

## 2023-06-18 DIAGNOSIS — F4321 Adjustment disorder with depressed mood: Secondary | ICD-10-CM

## 2023-06-18 MED ORDER — GABAPENTIN 800 MG PO TABS: ORAL_TABLET | ORAL | 2 refills | Status: DC

## 2023-06-18 NOTE — Progress Notes (Signed)
[facility]  Patient ID: Matthew [name],  MRN: [id number]  PCP: Patient, No Pcp Per  Date of Evaluation: 06/18/2023 Time spent:25 minutes  Chief Complaint:  Chief Complaint   Follow-up    HISTORY/CURRENT STATUS: HPI For routine med check.   His mom died unexpectedly in 2024/04/18.  She had had a stroke several years ago and was partially paralyzed.  She was ready to go.  It has been sad but he is okay for the most part.  Then not but a few weeks later his sister was diagnosed with leukemia.  She is getting treatment.  If it was not for these situations he would be doing well.  He is not nearly as depressed and anxious as he has been in the past.  No panic attacks but he does get overwhelmed easily.  Has a hard time sitting still a lot of the time.  He got a summons for jury duty and asked if I can send a letter requesting he be excused.  Patient is able to enjoy things.  Energy and motivation are good.  No extreme sadness, tearfulness, or feelings of hopelessness.  Sleeps well most of the time. ADLs and personal hygiene are normal.   Denies any changes in concentration, making decisions, or remembering things.  Appetite has not changed.  Weight is stable.  Denies suicidal or homicidal thoughts.  Patient denies increased energy with decreased need for sleep, increased talkativeness, racing thoughts, impulsivity or risky behaviors, increased spending, increased libido, grandiosity, increased irritability or anger, paranoia, or hallucinations.  TD symptoms are treated well with Ingrezza.  Denies dizziness, syncope, seizures, numbness, tingling, tremor, tics, unsteady gait, slurred speech, confusion. Denies muscle or joint pain, stiffness, or dystonia. Denies unexplained weight loss, frequent infections, or sores that heal slowly.  No polyphagia, polydipsia, or polyuria. Denies visual changes or paresthesias.   Individual Medical History/ Review of Systems: Changes? :No      Past medications for mental health diagnoses include: Risperdal, Zyprexa, Abilify, [name], Klonopin, Gabapentin, Xanax, Valium, Ativan, Hydroxyzine, Trilafon, Atenolol, Cymbalta, Prozac, Lexapro, Paxil, Lithium, Adderall, Ritalin, Mirtazepine, Sonata was ineffective.  Austedo helped some, Strattera, Quelbree caused headaches.  Ingrezza  Allergies: Gluten meal and Lactose intolerance (gi)  Current Medications:  Current Outpatient Medications:    ARIPiprazole (ABILIFY) 20 MG tablet, Take 1 tablet (20 mg total) by mouth daily., Disp: 90 tablet, Rfl: 1   aspirin EC 81 MG tablet, Take 1 tablet (81 mg total) by mouth daily. Swallow whole., Disp: 30 tablet, Rfl: 0   atomoxetine (STRATTERA) 100 MG capsule, Take 1 capsule (100 mg total) by mouth daily., Disp: 90 capsule, Rfl: 1   atorvastatin (LIPITOR) 80 MG tablet, Take 1 tablet (80 mg total) by mouth daily., Disp: 90 tablet, Rfl: 2   Bempedoic Acid-Ezetimibe (NEXLIZET) 180-10 MG TABS, Take 1 tablet by mouth daily., Disp: 90 tablet, Rfl: 3   carvedilol (COREG) 12.5 MG tablet, Take 3 tablets (37.5 mg total) by mouth 2 (two) times daily., Disp: 540 tablet, Rfl: 2   clopidogrel (PLAVIX) 75 MG tablet, Take 1 tablet (75 mg total) by mouth daily., Disp: 90 tablet, Rfl: 3   DULoxetine (CYMBALTA) 60 MG capsule, Take 2 capsules (120 mg total) by mouth daily., Disp: 180 capsule, Rfl: 1   empagliflozin (JARDIANCE) 25 MG TABS tablet, Take 1 tablet (25 mg total) by mouth every morning., Disp: 90 tablet, Rfl: 3   fenofibrate (TRICOR) 145 MG tablet, Take 1 tablet (145 mg total) by mouth daily.,  Disp: 90 tablet, Rfl: 3   hydrOXYzine (ATARAX) 50 MG tablet, TAKE 1 TABLET BY MOUTH EVERY 6 HOURS AS NEEDED., Disp: 360 tablet, Rfl: 1   icosapent Ethyl (VASCEPA) 1 g capsule, Take 2 capsules (2 g total) by mouth 2 (two) times daily., Disp: 360 capsule, Rfl: 3   insulin glargine (LANTUS SOLOSTAR) 100 UNIT/ML Solostar Pen, Inject 28 Units into the skin daily., Disp: 30 mL,  Rfl: 3   metFORMIN (GLUCOPHAGE) 1000 MG tablet, Take 1 tablet (1,000 mg total) by mouth 2 (two) times daily with a meal., Disp: 180 tablet, Rfl: 3   Semaglutide,0.25 or 0.5MG /DOS, (OZEMPIC, 0.25 OR 0.5 MG/DOSE,) 2 MG/1.5ML SOPN, Inject 0.5 mg into the skin once a week., Disp: 3 mL, Rfl: 1   traMADol (ULTRAM) 50 MG tablet, Take 50 mg by mouth 2 (two) times daily as needed., Disp: , Rfl:    traMADol (ULTRAM) 50 MG tablet, 1 tablet as needed Orally twice a day for 7 days, Disp: , Rfl:    valbenazine (INGREZZA) 80 MG capsule, Take 1 capsule (80 mg total) by mouth daily., Disp: 30 capsule, Rfl: 1   gabapentin (NEURONTIN) 800 MG tablet, TAKE 1 TABLET BY MOUTH 4 TIMES A DAY WITH AN EXTRA 1/2 TABLET DURING THE DAY AS NEEDED, Disp: 135 tablet, Rfl: 2   Insulin Pen Needle 32G X 4 MM MISC, 1 Device by Does not apply route daily in the afternoon. (Patient not taking: Reported on 06/18/2023), Disp: 100 each, Rfl: 3   nicotine (NICODERM CQ - DOSED IN MG/24 HOURS) 21 mg/24hr patch, Place 1 patch (21 mg total) onto the skin daily. (Patient not taking: Reported on 06/18/2023), Disp: 28 patch, Rfl: 3   nicotine polacrilex ([employer] NICOTINE) 4 MG gum, Take 1 each (4 mg total) by mouth as needed for smoking cessation. (Patient not taking: Reported on 06/18/2023), Disp: 100 tablet, Rfl: 1   sulfamethoxazole-trimethoprim (BACTRIM DS) 800-160 MG tablet, Take 1 tablet by mouth 2 (two) times daily. for 10 days (Patient not taking: Reported on 06/18/2023), Disp: , Rfl:  Medication Side Effects: none  Family Medical/ Social History: Changes? Mom died 04-24-2023. Sister dx w/ leukemia  MENTAL HEALTH EXAM:  There were no vitals taken for this visit.There is no height or weight on file to calculate BMI.  General Appearance: Casual and Well Groomed  Eye Contact:  Good  Speech:  Clear and Coherent and Normal Rate  Volume:  Normal  Mood:  Euthymic  Affect:  Congruent  Thought Process:  Goal Directed and Descriptions of Associations:  Circumstantial  Orientation:  Full (Time, Place, and Person)  Thought Content: Logical   Suicidal Thoughts:  No  Homicidal Thoughts:  No  Memory:  WNL  Judgement:  Fair  Insight:  Good  Psychomotor Activity:   Occasional lateral motion with mandible but it is not severe like it was before going on Ingrezza  Concentration:  Concentration: Good and Attention Span: Fair  Recall:  Good  Fund of Knowledge: Good  Language: Good  Assets:  Desire for Improvement Financial Resources/Insurance Housing Transportation  ADL's:  Intact  Cognition: WNL  Prognosis:  Good           AIMS    Flowsheet Row Office Visit from 06/18/2023 in [facility] Office Visit from 03/18/2023 in [facility] Office Visit from 02/18/2023 in [facility] Office Visit from 05/30/2022 in [facility] Office Visit from 04/20/2022 in [facility]  AIMS Total Score 1 2 8 3 7       PHQ2-9    Flowsheet Row CARDIAC REHAB PHASE II ORIENTATION from 08/16/2020 in [facility]  PHQ-2 Total Score 0      Flowsheet Row ED to Hosp-Admission (Discharged) from 06/30/2022 in [place] [facility] ED to Hosp-Admission (Discharged) from 06/01/2022 in [facility] KIDNEY UNIT Admission (Discharged) from 11/28/2021 in [place] PERIOPERATIVE AREA  C-SSRS RISK CATEGORY No Risk No Risk No Risk      PCP follows labs  DIAGNOSES:    ICD-10-CM   1. Schizoaffective disorder, bipolar type (HCC)  F25.0     2. Generalized anxiety disorder  F41.1     3. Attention deficit hyperactivity disorder (ADHD), combined type, moderate  F90.2     4. Tardive dyskinesia  G24.01     5. Grief  F43.21       Receiving Psychotherapy: No   RECOMMENDATIONS:  PDMP reviewed.  Gabapentin filled 06/05/2023.   Also on tramadol.   I  provided 25 minutes of face to face time during this encounter, including time spent before and after the visit in records review, medical decision making, counseling pertinent to today's visit, and charting.   My condolences in the loss of his mom.  He will look at the jury duty summons and if we are still within the deadline for requesting excuse all then he will bring the letter to the office and I will will sign a letter requesting that he be excused due to anxiety.  As far as his medications go he is doing well so no changes will be made.  Continue Abilify 20 mg, 1 p.o. daily. Continue Strattera 100 mg, 1 p.o. daily. Continue Cymbalta 60 mg, 2 p.o. daily. Continue gabapentin 800 mg 1 p.o. 4 times daily with 1/2 pill mid day.  Continue hydroxyzine 50 mg, 1 p.o. 4 times daily as needed.   Continue Ingrezza 80 mg daily.  Strongly recommend counseling. Return in 3 months.  Melony Overly, PA-C

## 2023-06-19 DIAGNOSIS — Z0289 Encounter for other administrative examinations: Secondary | ICD-10-CM

## 2023-06-20 ENCOUNTER — Other Ambulatory Visit: Payer: Self-pay

## 2023-06-20 MED ORDER — CARVEDILOL 12.5 MG PO TABS: 37.5000 mg | ORAL_TABLET | Freq: Two times a day (BID) | ORAL | 3 refills | Status: DC

## 2023-06-20 MED ORDER — ATORVASTATIN CALCIUM 80 MG PO TABS: 80.0000 mg | ORAL_TABLET | Freq: Every day | ORAL | 3 refills | Status: DC

## 2023-06-26 ENCOUNTER — Other Ambulatory Visit: Payer: Self-pay | Admitting: Internal Medicine

## 2023-07-04 ENCOUNTER — Encounter: Payer: Self-pay | Admitting: Internal Medicine

## 2023-07-04 ENCOUNTER — Ambulatory Visit: Payer: Medicare HMO | Admitting: Internal Medicine

## 2023-07-04 VITALS — BP 124/82 | HR 95 | Ht 75.0 in | Wt 271.0 lb

## 2023-07-04 DIAGNOSIS — Z794 Long term (current) use of insulin: Secondary | ICD-10-CM

## 2023-07-04 DIAGNOSIS — L84 Corns and callosities: Secondary | ICD-10-CM

## 2023-07-04 DIAGNOSIS — Z7984 Long term (current) use of oral hypoglycemic drugs: Secondary | ICD-10-CM

## 2023-07-04 DIAGNOSIS — E1165 Type 2 diabetes mellitus with hyperglycemia: Secondary | ICD-10-CM

## 2023-07-04 DIAGNOSIS — Z7985 Long-term (current) use of injectable non-insulin antidiabetic drugs: Secondary | ICD-10-CM

## 2023-07-04 DIAGNOSIS — R0989 Other specified symptoms and signs involving the circulatory and respiratory systems: Secondary | ICD-10-CM

## 2023-07-04 LAB — POCT GLYCOSYLATED HEMOGLOBIN (HGB A1C): Hemoglobin A1C: 7.2 % — AB (ref 4.0–5.6)

## 2023-07-04 MED ORDER — INSULIN PEN NEEDLE 32G X 4 MM MISC: 1.0000 | Freq: Every day | 3 refills | Status: DC

## 2023-07-04 MED ORDER — SEMAGLUTIDE (1 MG/DOSE) 4 MG/3ML ~~LOC~~ SOPN: 1.0000 mg | PEN_INJECTOR | SUBCUTANEOUS | 3 refills | Status: DC

## 2023-07-04 MED ORDER — EMPAGLIFLOZIN 25 MG PO TABS: 25.0000 mg | ORAL_TABLET | Freq: Every morning | ORAL | 3 refills | Status: DC

## 2023-07-04 MED ORDER — LANTUS SOLOSTAR 100 UNIT/ML ~~LOC~~ SOPN
22.0000 [IU] | PEN_INJECTOR | Freq: Every day | SUBCUTANEOUS | 3 refills | Status: AC
Start: 2023-07-04 — End: ?

## 2023-07-04 MED ORDER — METFORMIN HCL ER 750 MG PO TB24: 1500.0000 mg | ORAL_TABLET | Freq: Every day | ORAL | 3 refills | Status: DC

## 2023-07-04 NOTE — Patient Instructions (Addendum)
-   Increase Ozempic 1 mg weekly  - Change Metformin 750 mg ,2  tablet before Breakfast  - Continue  Jardiance 25 mg, 1 tablet before Breakfast  - Decrease  Lantus 22  units once daily      HOW TO TREAT LOW BLOOD SUGARS (Blood sugar LESS THAN 70 MG/DL) Please follow the RULE OF 15 for the treatment of hypoglycemia treatment (when your (blood sugars are less than 70 mg/dL)   STEP 1: Take 15 grams of carbohydrates when your blood sugar is low, which includes:  3-4 GLUCOSE TABS  OR 3-4 OZ OF JUICE OR REGULAR SODA OR ONE TUBE OF GLUCOSE GEL    STEP 2: RECHECK blood sugar in 15 MINUTES STEP 3: If your blood sugar is still low at the 15 minute recheck --> then, go back to STEP 1 and treat AGAIN with another 15 grams of carbohydrates.

## 2023-07-04 NOTE — Progress Notes (Signed)
Name: Matthew [name]  Age/ Sex: 48 y.o., male   MRN/ DOB: [id number], [birth year]     PCP: Patient, No Pcp Per   Reason for Endocrinology Evaluation: Type 2 Diabetes Mellitus  Initial Endocrine Consultative Visit: 08/15/2020    PATIENT IDENTIFIER: Mr. CALIEB LICHTMAN is a 48 y.o. male with a past medical history of T2DN, CAD , OSA and schizoaffective d/o. The patient has followed with Endocrinology clinic since 08/15/2020 for consultative assistance with management of his diabetes.  DIABETIC HISTORY:  Mr. Lott was diagnosed with DM 2021,he has been on metformin and jardiance . His hemoglobin A1c has ranged from 6.4% in 2020, peaking at 12.8% in 2022.   Pt on disability for mental health   Started Ozempic 03/2023 with an A1c of 9.4%  SUBJECTIVE:   During the last visit (03/25/2023): A1c 9.4 %  Today (07/04/2023): Mr. Drudge is here for a diabetes management.  He checks his blood sugars multiple  times daily, through CGM.    Patient continues to follow-up with psychiatry Patient continues to follow-up with cardiology for CAD, ischemic cardiomyopathy and dyslipidemia   Weight stable Denies nausea, or vomiting  Minimal constipation but no diarrhea  Has noted a right foot growth , for a few months, denies discharge or fever   HOME DIABETES REGIMEN:  Metformin 1000 mg , 1 tablet before Breakfast and 1 tablet before Supper Jardiance 25 mg, 1 tablet before Breakfast Ozempic 0.5 mg weekly Lantus  28 units daily      Statin: yes ACE-I/ARB: yes   CONTINUOUS GLUCOSE MONITORING RECORD INTERPRETATION    Dates of Recording: 1/31-2/13/2025  Sensor description: dexcom  Results statistics:   CGM use % of time 92  Average and SD 150/38  Time in range    80    %  % Time Above 180 18  % Time above 250 1  % Time Below target 1   Glycemic patterns summary: BGs are optimal throughout the day and night  Hyperglycemic episodes postprandial  Hypoglycemic episodes occurred  overnight  Overnight periods: Variable    DIABETIC COMPLICATIONS: Microvascular complications:  Neuropathy Denies: CKD, retinopathy Last Eye Exam: past due   Macrovascular complications:  CAD Denies: CVA, PVD   HISTORY:  Past Medical History:  Past Medical History:  Diagnosis Date   Anxiety    Arthritis    Celiac disease    Coronary artery disease    Dairy allergy    Depression    Diabetes mellitus without complication (HCC)    High cholesterol    Hypertension    Hypothyroidism    Myocardial infarction (HCC)    OSA (obstructive sleep apnea)    Paranoia (HCC)    Schizoaffective disorder (HCC)    Past Surgical History:  Past Surgical History:  Procedure Laterality Date   APPENDECTOMY     CARDIAC CATHETERIZATION     CORONARY STENT INTERVENTION N/A 06/06/2020   Procedure: CORONARY STENT INTERVENTION;  Surgeon: Swaziland, Peter M, MD;  Location: [facility] INVASIVE CV LAB;  Service: Cardiovascular;  Laterality: N/A;  prox LAD, distal RCA   CORONARY ULTRASOUND/IVUS N/A 06/06/2020   Procedure: Intravascular Ultrasound/IVUS;  Surgeon: Swaziland, Peter M, MD;  Location: [facility] INVASIVE CV LAB;  Service: Cardiovascular;  Laterality: N/A;   LEFT HEART CATH AND CORONARY ANGIOGRAPHY N/A 06/06/2020   Procedure: LEFT HEART CATH AND CORONARY ANGIOGRAPHY;  Surgeon: Swaziland, Peter M, MD;  Location: [facility] INVASIVE CV LAB;  Service: Cardiovascular;  Laterality: N/A;   LEFT HEART CATH  AND CORONARY ANGIOGRAPHY N/A 02/24/2021   Procedure: LEFT HEART CATH AND CORONARY ANGIOGRAPHY;  Surgeon: Lyn Records, MD;  Location: [facility] INVASIVE CV LAB;  Service: Cardiovascular;  Laterality: N/A;   NASAL SEPTUM SURGERY     TOTAL SHOULDER ARTHROPLASTY Right 11/28/2021   Procedure: TOTAL SHOULDER ARTHROPLASTY;  Surgeon: Teryl Lucy, MD;  Location: [facility] ORS;  Service: Orthopedics;  Laterality: Right;   WRIST FRACTURE SURGERY     right wrist   Social History:  reports that he has been smoking cigarettes. He has a 1  pack-year smoking history. He has never used smokeless tobacco. He reports that he does not drink alcohol and does not use drugs. Family History:  Family History  Problem Relation Age of Onset   Asthma Mother    Hypertension Mother    CVA Mother    Hypertension Father    Leukemia Sister      HOME MEDICATIONS: Allergies as of 07/04/2023       Reactions   Gluten Meal Other (See Comments)   Celiac disease   Lactose Intolerance (gi) Diarrhea   Can tolerate hard cheese        Medication List        Accurate as of July 04, 2023 11:02 AM. If you have any questions, ask your nurse or doctor.          STOP taking these medications    sulfamethoxazole-trimethoprim 800-160 MG tablet Commonly known as: BACTRIM DS Stopped by: Johnney Ou Geraldin Habermehl       TAKE these medications    ARIPiprazole 20 MG tablet Commonly known as: ABILIFY Take 1 tablet (20 mg total) by mouth daily.   aspirin EC 81 MG tablet Take 1 tablet (81 mg total) by mouth daily. Swallow whole.   atomoxetine 100 MG capsule Commonly known as: STRATTERA Take 1 capsule (100 mg total) by mouth daily.   atorvastatin 80 MG tablet Commonly known as: LIPITOR Take 1 tablet (80 mg total) by mouth daily.   carvedilol 12.5 MG tablet Commonly known as: COREG Take 3 tablets (37.5 mg total) by mouth 2 (two) times daily.   clopidogrel 75 MG tablet Commonly known as: PLAVIX Take 1 tablet (75 mg total) by mouth daily.   DULoxetine 60 MG capsule Commonly known as: Cymbalta Take 2 capsules (120 mg total) by mouth daily.   empagliflozin 25 MG Tabs tablet Commonly known as: Jardiance Take 1 tablet (25 mg total) by mouth every morning.   fenofibrate 145 MG tablet Commonly known as: Tricor Take 1 tablet (145 mg total) by mouth daily.   gabapentin 800 MG tablet Commonly known as: NEURONTIN TAKE 1 TABLET BY MOUTH 4 TIMES A DAY WITH AN EXTRA 1/2 TABLET DURING THE DAY AS NEEDED   hydrOXYzine 50 MG  tablet Commonly known as: ATARAX TAKE 1 TABLET BY MOUTH EVERY 6 HOURS AS NEEDED.   icosapent Ethyl 1 g capsule Commonly known as: Vascepa Take 2 capsules (2 g total) by mouth 2 (two) times daily.   Insulin Pen Needle 32G X 4 MM Misc 1 Device by Does not apply route daily in the afternoon.   Lantus SoloStar 100 UNIT/ML Solostar Pen Generic drug: insulin glargine Inject 28 Units into the skin daily.   metFORMIN 1000 MG tablet Commonly known as: GLUCOPHAGE Take 1 tablet (1,000 mg total) by mouth 2 (two) times daily with a meal.   Nexlizet 180-10 MG Tabs Generic drug: Bempedoic Acid-Ezetimibe Take 1 tablet by mouth daily.   nicotine 21  mg/24hr patch Commonly known as: NICODERM CQ - dosed in mg/24 hours Place 1 patch (21 mg total) onto the skin daily.   nicotine polacrilex 4 MG gum Commonly known as: [facility] Nicotine Take 1 each (4 mg total) by mouth as needed for smoking cessation.   Ozempic (0.25 or 0.5 MG/DOSE) 2 MG/3ML Sopn Generic drug: Semaglutide(0.25 or 0.5MG /DOS) INJECT 0.5 MG INTO THE SKIN ONE TIME PER WEEK   traMADol 50 MG tablet Commonly known as: ULTRAM Take 50 mg by mouth 2 (two) times daily as needed.   traMADol 50 MG tablet Commonly known as: ULTRAM 1 tablet as needed Orally twice a day for 7 days   valbenazine 80 MG capsule Commonly known as: INGREZZA Take 1 capsule (80 mg total) by mouth daily.         OBJECTIVE:   Vital Signs: BP 124/82 (BP Location: Left Arm, Patient Position: Sitting, Cuff Size: Normal)   Pulse 95   Ht 6\' 3"  (1.905 m)   Wt 271 lb (122.9 kg)   SpO2 96%   BMI 33.87 kg/m   Wt Readings from Last 3 Encounters:  07/04/23 271 lb (122.9 kg)  04/05/23 271 lb (122.9 kg)  03/25/23 272 lb (123.4 kg)     Exam: General: Pt appears well and is in NAD  Lungs: Clear with good BS bilat   Heart: RRR   Abdomen: soft, nontender  Extremities: No pretibial edema.   Neuro: MS is good with appropriate affect, pt is alert and Ox3    DM  foot exam: 07/04/2023 The skin of the feet is without sores or ulcerations, callous formation at the heels The pedal pulses are undetectable  The sensation is intact to a screening 5.07, 10 gram monofilament bilaterally       DATA REVIEWED:  Lab Results  Component Value Date   HGBA1C 7.2 (A) 07/04/2023   HGBA1C 9.4 (A) 03/25/2023   HGBA1C 7.6 (A) 09/21/2022    Latest Reference Range & Units 02/08/23 08:32  Sodium 134 - 144 mmol/L 141  Potassium 3.5 - 5.2 mmol/L 4.1  Chloride 96 - 106 mmol/L 103  CO2 20 - 29 mmol/L 22  Glucose 70 - 99 mg/dL 119 (H)  BUN 6 - 24 mg/dL 13  Creatinine 1.47 - 8.29 mg/dL 5.62  Calcium 8.7 - 13.0 mg/dL 9.7  BUN/Creatinine Ratio 9 - 20  12  eGFR >59 mL/min/1.73 89  Alkaline Phosphatase 44 - 121 IU/L 102  Albumin 4.1 - 5.1 g/dL 4.6  AST 0 - 40 IU/L 49 (H)  ALT 0 - 44 IU/L 103 (H)  Total Protein 6.0 - 8.5 g/dL 7.1  Total Bilirubin 0.0 - 1.2 mg/dL 0.4    Latest Reference Range & Units 02/08/23 08:32  Total CHOL/HDL Ratio 0.0 - 5.0 ratio 86.5 (H)  Cholesterol, Total 100 - 199 mg/dL 784  HDL Cholesterol >69 mg/dL 16 (L)  Direct LDL 0 - 99 mg/dL 49  Triglycerides 0 - 629 mg/dL 528 (HH)  VLDL Cholesterol Cal 5 - 40 mg/dL 413 (H)  LDL Chol Calc (NIH) 0 - 99 mg/dL 63  LDL CALC COMMENT:  Comment    Latest Reference Range & Units 09/21/22 16:36  Microalb, Ur mg/dL 0.2  MICROALB/CREAT RATIO <30 mg/g creat 4  Creatinine, Urine 20 - 320 mg/dL 46     ASSESSMENT / PLAN / RECOMMENDATIONS:   1) Type 2 Diabetes Mellitus, Optimally  controlled, With neuropathic  complications - Most recent A1c of 7.2 %. Goal A1c <  7.0 %.     -A1c has improved dramatically -I have encouraged the patient to continue with lifestyle changes -He is tolerating Ozempic, will increase -Patient forgets to take the second dose of metformin, I will switch to 750 Mg XR, and he can take 2 tablets every morning -I will decrease Lantus due to hypoglycemia overnight -Barriers to  diabetes self-care is mental health issues as well as social determinants.  Patient has not been able to obtain CGM technology due to cost  MEDICATIONS: Change metformin 750 mg , 2 tablet before Breakfast  Continue  Jardiance 25 mg, 1 tablet before Breakfast  Decrease Lantus  22  units once daily  Increase Ozempic 1 mg weekly  EDUCATION / INSTRUCTIONS: BG monitoring instructions: Patient is instructed to check his blood sugars 1 times a day. Call [facility] if: BG persistently < 70  I reviewed the Rule of 15 for the treatment of hypoglycemia in detail with the patient. Literature supplied.   2) Diabetic complications:  Eye: Does not have known diabetic retinopathy.  Neuro/ Feet: Does not have known diabetic peripheral neuropathy .  Renal: Patient does not have known baseline CKD. He   is  on an ACEI/ARB at present.    3) Callus formation /Decrease Pulses :  -Patient has a lesion on the right lateral foot, this looks to me like another callus.  Not typical for wart, but will refer to podiatry -I will also proceed with lower extremity ultrasound as I was unable to detect pulses, I also noted discrepancy between right and left feet temperatures   4) Hypertriglyceridemia   - Per cardiology      F/U in 4 months     Signed electronically by: Lyndle Herrlich, MD  [facility]  [facility] [address] [place] [place], [place] [address] Phone: [phone] FAX: [phone]   CC: Patient, No Pcp Per No address on file Phone: None  Fax: None  Return to Endocrinology clinic as below: Future Appointments  Date Time Provider Department Center  09/16/2023 11:00 AM Melony Overly T, PA-C CP-CP None

## 2023-07-15 ENCOUNTER — Other Ambulatory Visit: Payer: Self-pay | Admitting: Internal Medicine

## 2023-07-15 ENCOUNTER — Ambulatory Visit (INDEPENDENT_AMBULATORY_CARE_PROVIDER_SITE_OTHER): Payer: Medicare HMO | Admitting: Podiatry

## 2023-07-15 ENCOUNTER — Ambulatory Visit (INDEPENDENT_AMBULATORY_CARE_PROVIDER_SITE_OTHER): Payer: Medicare HMO

## 2023-07-15 ENCOUNTER — Encounter: Payer: Self-pay | Admitting: Podiatry

## 2023-07-15 DIAGNOSIS — L84 Corns and callosities: Secondary | ICD-10-CM

## 2023-07-15 DIAGNOSIS — L989 Disorder of the skin and subcutaneous tissue, unspecified: Secondary | ICD-10-CM | POA: Diagnosis not present

## 2023-07-15 DIAGNOSIS — Z79899 Other long term (current) drug therapy: Secondary | ICD-10-CM

## 2023-07-15 DIAGNOSIS — M79671 Pain in right foot: Secondary | ICD-10-CM

## 2023-07-15 DIAGNOSIS — L600 Ingrowing nail: Secondary | ICD-10-CM | POA: Diagnosis not present

## 2023-07-15 DIAGNOSIS — E1142 Type 2 diabetes mellitus with diabetic polyneuropathy: Secondary | ICD-10-CM

## 2023-07-15 DIAGNOSIS — Z794 Long term (current) use of insulin: Secondary | ICD-10-CM

## 2023-07-15 DIAGNOSIS — R0989 Other specified symptoms and signs involving the circulatory and respiratory systems: Secondary | ICD-10-CM

## 2023-07-15 DIAGNOSIS — B351 Tinea unguium: Secondary | ICD-10-CM

## 2023-07-15 MED ORDER — UREA 40 % EX CREA: 1.0000 | TOPICAL_CREAM | Freq: Two times a day (BID) | CUTANEOUS | 1 refills | Status: DC

## 2023-07-15 NOTE — Patient Instructions (Signed)
 Monitor for any signs/symptoms of infection. Call the office immediately if any occur or go directly to the emergency room. Call with any questions/concerns.  --  Terbinafine Tablets What is this medication? TERBINAFINE (TER bin a feen) treats fungal infections of the nails. It belongs to a group of medications called antifungals. It will not treat infections caused by bacteria or viruses. This medicine may be used for other purposes; ask your health care provider or pharmacist if you have questions. COMMON BRAND NAME(S): Lamisil, Terbinex What should I tell my care team before I take this medication? They need to know if you have any of these conditions: Liver disease An unusual or allergic reaction to terbinafine, other medications, foods, dyes, or preservatives Pregnant or trying to get pregnant Breast-feeding How should I use this medication? Take this medication by mouth with water. Take it as directed on the prescription label at the same time every day. You can take it with or without food. If it upsets your stomach, take it with food. Keep taking it unless your care team tells you to stop. A special MedGuide will be given to you by the pharmacist with each prescription and refill. Be sure to read this information carefully each time. Talk to your care team about the use of this medication in children. Special care may be needed. Overdosage: If you think you have taken too much of this medicine contact a poison control center or emergency room at once. NOTE: This medicine is only for you. Do not share this medicine with others. What if I miss a dose? If you miss a dose, take it as soon as you can unless it is more than 4 hours late. If it is more than 4 hours late, skip the missed dose. Take the next dose at the normal time. What may interact with this medication? Do not take this medication with any of the following: Pimozide Thioridazine This medication may also interact with the  following: Beta blockers Caffeine Certain medications for mental health conditions Cimetidine Cyclosporine Medications for fungal infections, such as fluconazole or ketoconazole Medications for irregular heartbeat, such as amiodarone, flecainide, propafenone Rifampin Warfarin This list may not describe all possible interactions. Give your health care provider a list of all the medicines, herbs, non-prescription drugs, or dietary supplements you use. Also tell them if you smoke, drink alcohol, or use illegal drugs. Some items may interact with your medicine. What should I watch for while using this medication? Visit your care team for regular checks on your progress. Tell your care team if your symptoms do not start to get better or if they get worse. This medication may cause serious skin reactions. They can happen weeks to months after starting the medication. Contact your care team right away if you notice fevers or flu-like symptoms with a rash. The rash may be red or purple and then turn into blisters or peeling of the skin. You may also notice a red rash with swelling of the face, lips, or lymph nodes in your neck or under your arms. This medication can make you more sensitive to the sun. Keep out of the sun. If you cannot avoid being in the sun, wear protective clothing and sunscreen. Do not use sun lamps, tanning beds, or tanning booths. What side effects may I notice from receiving this medication? Side effects that you should report to your care team as soon as possible: Allergic reactions--skin rash, itching, hives, swelling of the face, lips, tongue, or  throat Change in sense of smell Change in taste Infection--fever, chills, cough, or sore throat Liver injury--right upper belly pain, loss of appetite, nausea, light-colored stool, dark yellow or brown urine, yellowing skin or eyes, unusual weakness or fatigue Low red blood cell level--unusual weakness or fatigue, dizziness, headache,  trouble breathing Lupus-like syndrome--joint pain, swelling, or stiffness, butterfly-shaped rash on the face, rashes that get worse in the sun, fever, unusual weakness or fatigue Rash, fever, and swollen lymph nodes Redness, blistering, peeling, or loosening of the skin, including inside the mouth Unusual bruising or bleeding Worsening mood, feelings of depression Side effects that usually do not require medical attention (report to your care team if they continue or are bothersome): Diarrhea Gas Headache Nausea Stomach pain Upset stomach This list may not describe all possible side effects. Call your doctor for medical advice about side effects. You may report side effects to FDA at 1-800-FDA-1088. Where should I keep my medication? Keep out of the reach of children and pets. Store between 20 and 25 degrees C (68 and 77 degrees F). Protect from light. Get rid of any unused medication after the expiration date. To get rid of medications that are no longer needed or have expired: Take the medication to a medication take-back program. Check with your pharmacy or law enforcement to find a location. If you cannot return the medication, check the label or package insert to see if the medication should be thrown out in the garbage or flushed down the toilet. If you are not sure, ask your care team. If it is safe to put it in the trash, take the medication out of the container. Mix the medication with cat litter, dirt, coffee grounds, or other unwanted substance. Seal the mixture in a bag or container. Put it in the trash. NOTE: This sheet is a summary. It may not cover all possible information. If you have questions about this medicine, talk to your doctor, pharmacist, or health care provider.  2024 Elsevier/Gold Standard (2022-11-23 00:00:00)

## 2023-07-15 NOTE — Progress Notes (Unsigned)
 Subjective:   Patient ID: Matthew [name], male   DOB: 48 y.o.   MRN: [id number]   HPI Chief Complaint  Patient presents with   [facility]    RM#12 DFC/ CALLUS on lateral part of foot patient states bs under control last A1C 7.2 07/05/2023    48 year old male presents the office today with above concerns.  He states he has a callus on the lateral aspect the right foot on the fifth metatarsal base worsening present for about 1 year.  He states this started maybe after a couple was in his shoe.  He has not had any recent treatment.  No open lesions or any drainage.  Also concern for nail fungus he gets ingrown toenails.  He was seen by his endocrinologist was concerned about circulation and arterial studies were ordered.  He has not yet been scheduled for this.  No open lesions.   Review of Systems  All other systems reviewed and are negative.  Past Medical History:  Diagnosis Date   Anxiety    Arthritis    Celiac disease    Coronary artery disease    Dairy allergy    Depression    Diabetes mellitus without complication (HCC)    High cholesterol    Hypertension    Hypothyroidism    Myocardial infarction (HCC)    OSA (obstructive sleep apnea)    Paranoia (HCC)    Schizoaffective disorder (HCC)     Past Surgical History:  Procedure Laterality Date   APPENDECTOMY     CARDIAC CATHETERIZATION     CORONARY STENT INTERVENTION N/A 06/06/2020   Procedure: CORONARY STENT INTERVENTION;  Surgeon: Swaziland, Peter M, MD;  Location: [facility] INVASIVE CV LAB;  Service: Cardiovascular;  Laterality: N/A;  prox LAD, distal RCA   CORONARY ULTRASOUND/IVUS N/A 06/06/2020   Procedure: Intravascular Ultrasound/IVUS;  Surgeon: Swaziland, Peter M, MD;  Location: [facility] INVASIVE CV LAB;  Service: Cardiovascular;  Laterality: N/A;   LEFT HEART CATH AND CORONARY ANGIOGRAPHY N/A 06/06/2020   Procedure: LEFT HEART CATH AND CORONARY ANGIOGRAPHY;  Surgeon: Swaziland, Peter M, MD;  Location: [facility] INVASIVE CV LAB;  Service:  Cardiovascular;  Laterality: N/A;   LEFT HEART CATH AND CORONARY ANGIOGRAPHY N/A 02/24/2021   Procedure: LEFT HEART CATH AND CORONARY ANGIOGRAPHY;  Surgeon: Lyn Records, MD;  Location: [facility] INVASIVE CV LAB;  Service: Cardiovascular;  Laterality: N/A;   NASAL SEPTUM SURGERY     TOTAL SHOULDER ARTHROPLASTY Right 11/28/2021   Procedure: TOTAL SHOULDER ARTHROPLASTY;  Surgeon: Teryl Lucy, MD;  Location: [facility] ORS;  Service: Orthopedics;  Laterality: Right;   WRIST FRACTURE SURGERY     right wrist     Current Outpatient Medications:    ARIPiprazole (ABILIFY) 20 MG tablet, Take 1 tablet (20 mg total) by mouth daily., Disp: 90 tablet, Rfl: 1   aspirin EC 81 MG tablet, Take 1 tablet (81 mg total) by mouth daily. Swallow whole., Disp: 30 tablet, Rfl: 0   atomoxetine (STRATTERA) 100 MG capsule, Take 1 capsule (100 mg total) by mouth daily., Disp: 90 capsule, Rfl: 1   atorvastatin (LIPITOR) 80 MG tablet, Take 1 tablet (80 mg total) by mouth daily., Disp: 90 tablet, Rfl: 3   Bempedoic Acid-Ezetimibe (NEXLIZET) 180-10 MG TABS, Take 1 tablet by mouth daily., Disp: 90 tablet, Rfl: 3   carvedilol (COREG) 12.5 MG tablet, Take 3 tablets (37.5 mg total) by mouth 2 (two) times daily., Disp: 540 tablet, Rfl: 3   clopidogrel (PLAVIX) 75 MG tablet, Take 1  tablet (75 mg total) by mouth daily., Disp: 90 tablet, Rfl: 3   DULoxetine (CYMBALTA) 60 MG capsule, Take 2 capsules (120 mg total) by mouth daily., Disp: 180 capsule, Rfl: 1   empagliflozin (JARDIANCE) 25 MG TABS tablet, Take 1 tablet (25 mg total) by mouth every morning., Disp: 90 tablet, Rfl: 3   fenofibrate (TRICOR) 145 MG tablet, Take 1 tablet (145 mg total) by mouth daily., Disp: 90 tablet, Rfl: 3   gabapentin (NEURONTIN) 800 MG tablet, TAKE 1 TABLET BY MOUTH 4 TIMES A DAY WITH AN EXTRA 1/2 TABLET DURING THE DAY AS NEEDED, Disp: 135 tablet, Rfl: 2   hydrOXYzine (ATARAX) 50 MG tablet, TAKE 1 TABLET BY MOUTH EVERY 6 HOURS AS NEEDED., Disp: 360 tablet, Rfl: 1    icosapent Ethyl (VASCEPA) 1 g capsule, Take 2 capsules (2 g total) by mouth 2 (two) times daily., Disp: 360 capsule, Rfl: 3   insulin glargine (LANTUS SOLOSTAR) 100 UNIT/ML Solostar Pen, Inject 22 Units into the skin daily., Disp: 30 mL, Rfl: 3   Insulin Pen Needle 32G X 4 MM MISC, 1 Device by Does not apply route daily in the afternoon., Disp: 100 each, Rfl: 3   metFORMIN (GLUCOPHAGE-XR) 750 MG 24 hr tablet, Take 2 tablets (1,500 mg total) by mouth daily with breakfast., Disp: 180 tablet, Rfl: 3   nicotine (NICODERM CQ - DOSED IN MG/24 HOURS) 21 mg/24hr patch, Place 1 patch (21 mg total) onto the skin daily., Disp: 28 patch, Rfl: 3   nicotine polacrilex ([employer] NICOTINE) 4 MG gum, Take 1 each (4 mg total) by mouth as needed for smoking cessation., Disp: 100 tablet, Rfl: 1   Semaglutide, 1 MG/DOSE, 4 MG/3ML SOPN, Inject 1 mg as directed once a week., Disp: 9 mL, Rfl: 3   traMADol (ULTRAM) 50 MG tablet, Take 50 mg by mouth 2 (two) times daily as needed., Disp: , Rfl:    traMADol (ULTRAM) 50 MG tablet, 1 tablet as needed Orally twice a day for 7 days, Disp: , Rfl:    urea (GORDONS UREA) 40 % CREA, Apply 1 Application topically 2 (two) times daily. Apply to right foot callus, Disp: 198 g, Rfl: 1   valbenazine (INGREZZA) 80 MG capsule, Take 1 capsule (80 mg total) by mouth daily., Disp: 30 capsule, Rfl: 1  Allergies  Allergen Reactions   Gluten Meal Other (See Comments)    Celiac disease   Lactose Intolerance (Gi) Diarrhea    Can tolerate hard cheese          Objective:  Physical Exam  General: AAO x3, NAD  Dermatological: On the right foot fifth metatarsal base is punctate annular hyperkeratotic lesion but upon debridement there is no underlying ulceration, drainage or any signs of infection.  There is no puncture wound present or any foreign body identified.  The nails are hypertrophic, dystrophic yellow discoloration.  There is incurvation present the hallux toenails.  There is no drainage or  pus or signs of infection.  No open lesions.  Vascular: Dorsalis Pedis artery and Posterior Tibial artery pedal pulses are 1/4 on the right with immedate capillary fill time.  Pedal hair present.  There is no pain with calf compression, swelling, warmth, erythema.   Neruologic: Grossly intact via light touch bilateral.  Musculoskeletal: He does get tenderness along the hyperkeratotic lesion right foot.  No area pinpoint tenderness.    Assessment:   Skin lesion right foot; onychomycosis with ingrown toenails     Plan:  -Treatment options  discussed including all alternatives, risks, and complications -Etiology of symptoms were discussed -X-rays were obtained and reviewed with the patient.  Skin marker was utilized to identify the area of the soft tissue lesion.  No evidence of foreign body.  There is no fracture identified.  Skin lesion right foot -Sharply debrided lesion without any complications or bleeding.  Discussed urea cream daily I dispensed offloading pads.  If symptoms persist consider excision or different medications but given concern for PAD and will hold off any stronger medication such as Cantharone today. -Monitor for any clinical signs or symptoms of infection and directed to call the office immediately should any occur or go to the ER.  Onychomycosis -Discussed oral, topical as well as alternative treatments.  He was proceed with oral Lamisil.  Will check a CBC, LFT prior to starting medication.  Ingrown toenails -We discussed partial nail avulsion pending arterial studies should symptoms persist.  Monitoring signs or symptoms of infection  PAD -Concern given decreased pulses particular on the right side.  Arterial studies already ordered.  I gave him the contact information to call to get scheduled for this. -Daily foot inspection  Return in about 6 weeks (around 08/26/2023).  Vivi Barrack DPM

## 2023-07-19 ENCOUNTER — Ambulatory Visit
Admission: RE | Admit: 2023-07-19 | Discharge: 2023-07-19 | Disposition: A | Discharge status: Home / Self Care | Payer: Medicare HMO | Source: Ambulatory Visit | Attending: Internal Medicine | Admitting: Internal Medicine

## 2023-07-19 DIAGNOSIS — R0989 Other specified symptoms and signs involving the circulatory and respiratory systems: Secondary | ICD-10-CM

## 2023-07-22 ENCOUNTER — Encounter: Payer: Self-pay | Admitting: Internal Medicine

## 2023-07-30 ENCOUNTER — Other Ambulatory Visit: Payer: Self-pay | Admitting: Podiatry

## 2023-07-30 ENCOUNTER — Encounter: Payer: Self-pay | Admitting: Podiatry

## 2023-07-30 LAB — COMPLETE METABOLIC PANEL WITH GFR
AG Ratio: 2.5 (calc) (ref 1.0–2.5)
ALT: 115 U/L — ABNORMAL HIGH (ref 9–46)
AST: 59 U/L — ABNORMAL HIGH (ref 10–40)
Albumin: 4.8 g/dL (ref 3.6–5.1)
Alkaline phosphatase (APISO): 79 U/L (ref 36–130)
BUN/Creatinine Ratio: 10 (calc) (ref 6–22)
BUN: 14 mg/dL (ref 7–25)
CO2: 29 mmol/L (ref 20–32)
Calcium: 10 mg/dL (ref 8.6–10.3)
Chloride: 101 mmol/L (ref 98–110)
Creat: 1.34 mg/dL — ABNORMAL HIGH (ref 0.60–1.29)
Globulin: 1.9 g/dL (ref 1.9–3.7)
Glucose, Bld: 149 mg/dL — ABNORMAL HIGH (ref 65–139)
Potassium: 4.1 mmol/L (ref 3.5–5.3)
Sodium: 137 mmol/L (ref 135–146)
Total Bilirubin: 0.4 mg/dL (ref 0.2–1.2)
Total Protein: 6.7 g/dL (ref 6.1–8.1)
eGFR: 67 mL/min/{1.73_m2} (ref >=60)

## 2023-07-30 LAB — CBC WITH DIFFERENTIAL/PLATELET
Absolute Lymphocytes: 2734 {cells}/uL (ref 850–3900)
Absolute Monocytes: 595 {cells}/uL (ref 200–950)
Basophils Absolute: 121 {cells}/uL (ref 0–200)
Basophils Relative: 1.3 %
Eosinophils Absolute: 344 {cells}/uL (ref 15–500)
Eosinophils Relative: 3.7 %
HCT: 45.4 % (ref 38.5–50.0)
Hemoglobin: 14.9 g/dL (ref 13.2–17.1)
MCH: 28.4 pg (ref 27.0–33.0)
MCHC: 32.8 g/dL (ref 32.0–36.0)
MCV: 86.6 fL (ref 80.0–100.0)
MPV: 11.8 fL (ref 7.5–12.5)
Monocytes Relative: 6.4 %
Neutro Abs: 5506 {cells}/uL (ref 1500–7800)
Neutrophils Relative %: 59.2 %
Platelets: 280 10*3/uL (ref 140–400)
RBC: 5.24 10*6/uL (ref 4.20–5.80)
RDW: 14.4 % (ref 11.0–15.0)
Total Lymphocyte: 29.4 %
WBC: 9.3 10*3/uL (ref 3.8–10.8)

## 2023-07-30 MED ORDER — CICLOPIROX 8 % EX SOLN: Freq: Every day | CUTANEOUS | 2 refills | Status: DC

## 2023-08-01 ENCOUNTER — Telehealth: Payer: Self-pay

## 2023-08-01 NOTE — Telephone Encounter (Signed)
 Patient aware.

## 2023-08-01 NOTE — Telephone Encounter (Signed)
 Patient states that his liver enzymes where evaluated on his lab work from his podiatrist .

## 2023-08-06 ENCOUNTER — Other Ambulatory Visit: Payer: Self-pay | Admitting: Physician Assistant

## 2023-08-11 ENCOUNTER — Other Ambulatory Visit: Payer: Self-pay

## 2023-08-11 MED ORDER — VALBENAZINE TOSYLATE 80 MG PO CAPS: 80.0000 mg | ORAL_CAPSULE | Freq: Every day | ORAL | 1 refills | Status: DC

## 2023-08-15 ENCOUNTER — Other Ambulatory Visit: Payer: Self-pay | Admitting: Physician Assistant

## 2023-08-26 ENCOUNTER — Ambulatory Visit: Payer: Medicare HMO | Admitting: Podiatry

## 2023-08-26 ENCOUNTER — Encounter: Payer: Self-pay | Admitting: Podiatry

## 2023-08-26 DIAGNOSIS — B351 Tinea unguium: Secondary | ICD-10-CM

## 2023-08-26 NOTE — Progress Notes (Unsigned)
 Subjective: Chief Complaint  Patient presents with   Nail Problem    RM#12 Follow up on nail fungus patient is using medication with minor improvement.   48 year old male presents after above concerns.  Given his liver enzymes we were not able to do oral medications that he was prescribed Penlac which he has been using the minimal improvement.  Currently no pain to the nails.  No swelling or redness or drainage.  Objective: AAO x3, NAD DP/PT pulses palpable bilaterally, CRT less than 3 seconds To the hallux toenails in particular the nails remain hypertrophic, dystrophic with yellow discoloration.  Slight clear proximal nail fold.  There is no edema, erythema or signs of infection. No pain with calf compression, swelling, warmth, erythema  Assessment: Onychomycosis  Plan: -All treatment options discussed with the patient including all alternatives, risks, complications.  -As a courtesy debrided the nails without any complications or bleeding.  Unfortunately to hold off on oral medications and his liver enzymes.  Continue topical for now.  Discussed side effects, success rates and duration of use. -Patient encouraged to call the office with any questions, concerns, change in symptoms.   Vivi Barrack DPM

## 2023-08-26 NOTE — Patient Instructions (Signed)
 Ingrown Toenail  An ingrown toenail occurs when the corner or sides of a toenail grow into the surrounding skin. This causes discomfort and pain. The big toe is most commonly affected, but any of the toes can be affected. If an ingrown toenail is not treated, it can become infected. What are the causes? This condition may be caused by: Wearing shoes that are too small or tight. An injury, such as stubbing your toe or having your toe stepped on. Improper cutting or care of your toenails. Having nail or foot abnormalities that were present from birth (congenital abnormalities), such as having a nail that is too big for your toe. What increases the risk? The following factors may make you more likely to develop ingrown toenails: Age. Nails tend to get thicker with age, so ingrown nails are more common among older people. Cutting your toenails incorrectly, such as cutting them very short or cutting them unevenly. An ingrown toenail is more likely to get infected if you have: Diabetes. Blood flow (circulation) problems. What are the signs or symptoms? Symptoms of an ingrown toenail may include: Pain, soreness, or tenderness. Redness. Swelling. Hardening of the skin that surrounds the toenail. Signs that an ingrown toenail may be infected include: Fluid or pus. Symptoms that get worse. How is this diagnosed? Ingrown toenails may be diagnosed based on: Your symptoms and medical history. A physical exam. Labs or tests. If you have fluid or blood coming from your toenail, a sample may be collected to test for the specific type of bacteria that is causing the infection. How is this treated? Treatment depends on the severity of your symptoms. You may be able to care for your toenail at home. If you have an infection, you may be prescribed antibiotic medicines. If you have fluid or pus draining from your toenail, your health care provider may drain it. If you have trouble walking, you may be  given crutches to use. If you have a severe or infected ingrown toenail, you may need a procedure to remove part or all of the nail. Follow these instructions at home: Foot care  Check your wound every day for signs of infection, or as often as told by your health care provider. Check for: More redness, swelling, or pain. More fluid or blood. Warmth. Pus or a bad smell. Do not pick at your toenail or try to remove it yourself. Soak your foot in warm, soapy water. Do this for 20 minutes, 3 times a day, or as often as told by your health care provider. This helps to keep your toe clean and your skin soft. Wear shoes that fit well and are not too tight. Your health care provider may recommend that you wear open-toed shoes while you heal. Trim your toenails regularly and carefully. Cut your toenails straight across to prevent injury to the skin at the corners of the toenail. Do not cut your nails in a curved shape. Keep your feet clean and dry to help prevent infection. General instructions Take over-the-counter and prescription medicines only as told by your health care provider. If you were prescribed an antibiotic, take it as told by your health care provider. Do not stop taking the antibiotic even if you start to feel better. If your health care provider told you to use crutches to help you move around, use them as instructed. Return to your normal activities as told by your health care provider. Ask your health care provider what activities are safe for you.  Keep all follow-up visits. This is important. Contact a health care provider if: You have more redness, swelling, pain, or other symptoms that do not improve with treatment. You have fluid, blood, or pus coming from your toenail. You have a red streak on your skin that starts at your foot and spreads up your leg. You have a fever. Summary An ingrown toenail occurs when the corner or sides of a toenail grow into the surrounding skin.  This causes discomfort and pain. The big toe is most commonly affected, but any of the toes can be affected. If an ingrown toenail is not treated, it can become infected. Fluid or pus draining from your toenail is a sign of infection. Your health care provider may need to drain it. You may be given antibiotics to treat the infection. Trimming your toenails regularly and properly can help you prevent an ingrown toenail. This information is not intended to replace advice given to you by your health care provider. Make sure you discuss any questions you have with your health care provider. Document Revised: 09/06/2020 Document Reviewed: 09/06/2020 Elsevier Patient Education  2024 Elsevier Inc.   Ciclopirox Topical Solution What is this medication? CICLOPIROX (sye kloe PEER ox) treats fungal infections of the nails. It belongs to a group of medications called antifungals. It will not treat infections caused by bacteria or viruses. This medicine may be used for other purposes; ask your health care provider or pharmacist if you have questions. COMMON BRAND NAME(S): Ciclodan Nail Solution, CNL8, Penlac What should I tell my care team before I take this medication? They need to know if you have any of these conditions: Diabetes (high blood sugar) Immune system problems Organ transplant Receiving steroid inhalers, cream, or lotion Seizures Tingling of the fingers or toes or other nerve disorder An unusual or allergic reaction to ciclopirox, other medications, foods, dyes, or preservatives Pregnant or trying to get pregnant Breast-feeding How should I use this medication? This medication is for external use only. Do not take by mouth. Wash your hands before and after use. If you are treating your hands, only wash your hands before use. Do not get it in your eyes. If you do, rinse your eyes with plenty of cool tap water. Use it as directed on the prescription label at the same time every day. Do not  use it more often than directed. Use the medication for the full course as directed by your care team, even if you think you are better. Do not stop using it unless your care team tells you to stop it early. Apply a thin film of the medication to the affected area. Talk to your care team about the use of this medication in children. While it may be prescribed for children as young as 12 years for selected conditions, precautions do apply. Overdosage: If you think you have taken too much of this medicine contact a poison control center or emergency room at once. NOTE: This medicine is only for you. Do not share this medicine with others. What if I miss a dose? If you miss a dose, use it as soon as you can. If it is almost time for your next dose, use only that dose. Do not use double or extra doses. What may interact with this medication? Interactions are not expected. Do not use any other skin products without telling your care team. This list may not describe all possible interactions. Give your health care provider a list of all the  medicines, herbs, non-prescription drugs, or dietary supplements you use. Also tell them if you smoke, drink alcohol, or use illegal drugs. Some items may interact with your medicine. What should I watch for while using this medication? Visit your care team for regular checks on your progress. It may be some time before you see the benefit from this medication. Do not use nail polish or other nail cosmetic products on the treated nails. Removal of the unattached, infected nail by your care team is needed with use of this medication. If you have diabetes or numbness in your fingers or toes, talk to your care team about proper nail care. What side effects may I notice from receiving this medication? Side effects that you should report to your care team as soon as possible: Allergic reactions--skin rash, itching, hives, swelling of the face, lips, tongue, or  throat Burning, itching, crusting, or peeling of treated skin Side effects that usually do not require medical attention (report to your care team if they continue or are bothersome): Change in nail shape, thickness, or color Mild skin irritation, redness, or dryness This list may not describe all possible side effects. Call your doctor for medical advice about side effects. You may report side effects to FDA at 1-800-FDA-1088. Where should I keep my medication? Keep out of the reach of children and pets. Store at room temperature between 20 and 25 degrees C (68 and 77 degrees F). This medication is flammable. Avoid exposure to heat, fire, flame, and smoking. Get rid of medications that are no longer needed or have expired: Take the medication to a medication take-back program. Check with your pharmacy or law enforcement to find a location. If you cannot return the medication, check the label or package insert to see if the medication should be thrown out in the garbage or flushed down the toilet. If you are not sure, ask your care team. If it is safe to put in the trash, take the medication out of the container. Mix the medication with cat litter, dirt, coffee grounds, or other unwanted substance. Seal the mixture in a bag or container. Put it in the trash. NOTE: This sheet is a summary. It may not cover all possible information. If you have questions about this medicine, talk to your doctor, pharmacist, or health care provider.  2024 Elsevier/Gold Standard (2021-09-04 00:00:00)

## 2023-09-02 ENCOUNTER — Other Ambulatory Visit: Payer: Self-pay

## 2023-09-02 ENCOUNTER — Encounter (HOSPITAL_COMMUNITY): Payer: Self-pay

## 2023-09-02 ENCOUNTER — Emergency Department (HOSPITAL_COMMUNITY)
Admission: EM | Admit: 2023-09-02 | Discharge: 2023-09-02 | Disposition: A | Discharge status: Home / Self Care | Attending: Emergency Medicine | Admitting: Emergency Medicine

## 2023-09-02 ENCOUNTER — Emergency Department (HOSPITAL_COMMUNITY)

## 2023-09-02 ENCOUNTER — Telehealth: Payer: Self-pay | Admitting: Cardiology

## 2023-09-02 DIAGNOSIS — Z7982 Long term (current) use of aspirin: Secondary | ICD-10-CM | POA: Diagnosis not present

## 2023-09-02 DIAGNOSIS — I252 Old myocardial infarction: Secondary | ICD-10-CM | POA: Insufficient documentation

## 2023-09-02 DIAGNOSIS — I2510 Atherosclerotic heart disease of native coronary artery without angina pectoris: Secondary | ICD-10-CM | POA: Insufficient documentation

## 2023-09-02 DIAGNOSIS — Z794 Long term (current) use of insulin: Secondary | ICD-10-CM | POA: Diagnosis not present

## 2023-09-02 DIAGNOSIS — R079 Chest pain, unspecified: Secondary | ICD-10-CM | POA: Diagnosis present

## 2023-09-02 DIAGNOSIS — E119 Type 2 diabetes mellitus without complications: Secondary | ICD-10-CM | POA: Insufficient documentation

## 2023-09-02 DIAGNOSIS — Z7984 Long term (current) use of oral hypoglycemic drugs: Secondary | ICD-10-CM | POA: Diagnosis not present

## 2023-09-02 LAB — BASIC METABOLIC PANEL WITH GFR
Anion gap: 11 (ref 5–15)
BUN: 20 mg/dL (ref 6–20)
CO2: 24 mmol/L (ref 22–32)
Calcium: 9.7 mg/dL (ref 8.9–10.3)
Chloride: 100 mmol/L (ref 98–111)
Creatinine, Ser: 1.08 mg/dL (ref 0.61–1.24)
GFR, Estimated: >60 mL/min (ref >60)
Glucose, Bld: 156 mg/dL — ABNORMAL HIGH (ref 70–99)
Potassium: 4.7 mmol/L (ref 3.5–5.1)
Sodium: 135 mmol/L (ref 135–145)

## 2023-09-02 LAB — CBC
HCT: 46.5 % (ref 39.0–52.0)
Hemoglobin: 15.5 g/dL (ref 13.0–17.0)
MCH: 29 pg (ref 26.0–34.0)
MCHC: 33.3 g/dL (ref 30.0–36.0)
MCV: 86.9 fL (ref 80.0–100.0)
Platelets: 294 10*3/uL (ref 150–400)
RBC: 5.35 MIL/uL (ref 4.22–5.81)
RDW: 14.5 % (ref 11.5–15.5)
WBC: 10.7 10*3/uL — ABNORMAL HIGH (ref 4.0–10.5)
nRBC: 0 % (ref 0.0–0.2)

## 2023-09-02 LAB — TROPONIN I (HIGH SENSITIVITY)
Troponin I (High Sensitivity): 6 ng/L (ref <18)
Troponin I (High Sensitivity): 5 ng/L (ref <18)

## 2023-09-02 MED ORDER — ACETAMINOPHEN 325 MG PO TABS: 975.0000 mg | ORAL_TABLET | Freq: Once | ORAL | Status: DC

## 2023-09-02 NOTE — Telephone Encounter (Signed)
 Spoke to patient and he reports that he has started working out again. Admits that yesterday he worked out and started having shortness of breath, chest pain, and rapid heart rate that continued through the night. Pt reports that's that he had slight chest discomfort this morning. Spoke to Dr. Micael Adas (DOD) and per Dr. Micael Adas advise, pt advised to go to ER. Patient agreed with plan of care.

## 2023-09-02 NOTE — Discharge Instructions (Signed)
 You were seen in the emerged part for chest pain Your blood work including your high-sensitivity troponin looked okay We do not think you are having another heart attack at this time It is important that you follow-up with your cardiology to discuss today's ED visit As discussed, you should decrease your energy drink intake as these contain a lot of caffeine which can irritate the heart Return to the emergency room for chest pain trouble breathing or any other concerns

## 2023-09-02 NOTE — Telephone Encounter (Signed)
 Pt c/o of Chest Pain: STAT if active (IN THIS MOMENT) CP, including tightness, pressure, jaw pain, shoulder/upper arm/back pain, SOB, nausea, and vomiting.  1. Are you having CP right now (tightness, pressure, or discomfort)? No   2. Are you experiencing any other symptoms (ex. SOB, nausea, vomiting, sweating)? SOB (nothing currently)   3. How long have you been experiencing CP? One week   4. Is your CP continuous or coming and going? Coming and going (only when working out)   5. Have you taken Nitroglycerin? No, doesn't have anymore   6. If CP returns before callback, please consider calling 911. ?

## 2023-09-02 NOTE — ED Provider Triage Note (Signed)
 Emergency Medicine Provider Triage Evaluation Note  Matthew [name] , a 48 y.o. male  was evaluated in triage.  Pt complains of chest pain, sob, lightheadedness. States CP has been present since yesterday. Hx of heart attack two years ago, compliant on blood thinner. EKG reads "ACUTE STEMI". Room requested. Charge nurse made aware.  Review of Systems  Positive:  Negative:   Physical Exam  BP 138/86   Pulse 94   Temp 97.7 F (36.5 C)   Resp 17   SpO2 97%  Gen:   Awake, no distress   Resp:  Normal effort  MSK:   Moves extremities without difficulty  Other:    Medical Decision Making  Medically screening exam initiated at 1:30 PM.  Appropriate orders placed.  Matthew [name] was informed that the remainder of the evaluation will be completed by another provider, this initial triage assessment does not replace that evaluation, and the importance of remaining in the ED until their evaluation is complete.     Adel Aden, PA-C 09/02/23 1331

## 2023-09-02 NOTE — ED Triage Notes (Signed)
 Reports started having chest pain while working out yesterday with palpitations and sob.  Tried to see cardiology but was sent here. HX MI

## 2023-09-02 NOTE — ED Provider Notes (Signed)
 [place] EMERGENCY DEPARTMENT AT [facility] Provider Note   CSN: [id number] Arrival date & time: 09/02/23  1316     History  Chief Complaint  Patient presents with   Chest Pain    Matthew [name] is a 48 y.o. male.  With a history of MI status post DES x 2, CAD, type 2 diabetes and schizoaffective disorder presents to the ED for chest pain.  Patient first experienced chest pain several days ago that has been on and off since the onset.  Pain localized over the left chest.  Recurrent chest pain yesterday after exercising and doing squats.  Some chest pain on the left side today but did not exercise today.  Patient notes he has been drinking 3-4 energy drinks daily with 200 mg caffeine each.  No nausea vomiting fevers chills diaphoresis.  Called his cardiology office and was directed to the ED for further evaluation   Chest Pain      Home Medications Prior to Admission medications   Medication Sig Start Date End Date Taking? Authorizing Provider  ARIPiprazole (ABILIFY) 20 MG tablet TAKE 1 TABLET BY MOUTH EVERY DAY 08/07/23   Verneda Golder, PA-C  aspirin EC 81 MG tablet Take 1 tablet (81 mg total) by mouth daily. Swallow whole. 07/08/22   Robet Chiquito, NP  atomoxetine (STRATTERA) 100 MG capsule TAKE 1 CAPSULE BY MOUTH EVERY DAY 08/07/23   Marvia Slocumb T, PA-C  atorvastatin (LIPITOR) 80 MG tablet Take 1 tablet (80 mg total) by mouth daily. 06/20/23   Jacqueline Matsu, MD  Bempedoic Acid-Ezetimibe (NEXLIZET) 180-10 MG TABS Take 1 tablet by mouth daily. 02/11/23   Lucendia Rusk, MD  carvedilol (COREG) 12.5 MG tablet Take 3 tablets (37.5 mg total) by mouth 2 (two) times daily. 06/20/23   Jacqueline Matsu, MD  ciclopirox (PENLAC) 8 % solution Apply topically at bedtime. Apply over nail and surrounding skin. Apply daily over previous coat. After seven (7) days, may remove with alcohol and continue cycle. 07/30/23   Charity Conch, DPM  clopidogrel (PLAVIX) 75 MG tablet  Take 1 tablet (75 mg total) by mouth daily. 10/02/22   Sonny Dust, MD  DULoxetine (CYMBALTA) 60 MG capsule Take 2 capsules (120 mg total) by mouth daily. 03/18/23   Verneda Golder, PA-C  empagliflozin (JARDIANCE) 25 MG TABS tablet Take 1 tablet (25 mg total) by mouth every morning. 07/04/23   Shamleffer, Ibtehal Jaralla, MD  fenofibrate (TRICOR) 145 MG tablet Take 1 tablet (145 mg total) by mouth daily. 10/10/22   Sonny Dust, MD  gabapentin (NEURONTIN) 800 MG tablet TAKE 1 TABLET BY MOUTH 4 TIMES A DAY WITH AN EXTRA 1/2 TABLET DURING THE DAY AS NEEDED 06/18/23   Marvia Slocumb T, PA-C  hydrOXYzine (ATARAX) 50 MG tablet TAKE 1 TABLET BY MOUTH EVERY 6 HOURS AS NEEDED 08/15/23   Chet Cota, Ammon Bales T, PA-C  icosapent Ethyl (VASCEPA) 1 g capsule Take 2 capsules (2 g total) by mouth 2 (two) times daily. 10/10/22   Sonny Dust, MD  insulin glargine (LANTUS SOLOSTAR) 100 UNIT/ML Solostar Pen Inject 22 Units into the skin daily. 07/04/23   Shamleffer, Ibtehal Jaralla, MD  Insulin Pen Needle 32G X 4 MM MISC 1 Device by Does not apply route daily in the afternoon. 07/04/23   Shamleffer, Ibtehal Jaralla, MD  metFORMIN (GLUCOPHAGE-XR) 750 MG 24 hr tablet Take 2 tablets (1,500 mg total) by mouth daily with breakfast. 07/04/23   Shamleffer, Marilyne Shu  Jaralla, MD  nicotine (NICODERM CQ - DOSED IN MG/24 HOURS) 21 mg/24hr patch Place 1 patch (21 mg total) onto the skin daily. 10/10/22   Sonny Dust, MD  nicotine polacrilex ([employer] NICOTINE) 4 MG gum Take 1 each (4 mg total) by mouth as needed for smoking cessation. 10/10/22   Sonny Dust, MD  Semaglutide, 1 MG/DOSE, 4 MG/3ML SOPN Inject 1 mg as directed once a week. 07/04/23   Shamleffer, Ibtehal Jaralla, MD  traMADol (ULTRAM) 50 MG tablet Take 50 mg by mouth 2 (two) times daily as needed.    [provider]  traMADol (ULTRAM) 50 MG tablet 1 tablet as needed Orally twice a day for 7 days 03/21/23   [provider]  urea  (GORDONS UREA) 40 % CREA Apply 1 Application topically 2 (two) times daily. Apply to right foot callus 07/15/23   Charity Conch, DPM  valbenazine [facility]) 80 MG capsule Take 1 capsule (80 mg total) by mouth daily. 08/11/23   Marvia Slocumb T, PA-C      Allergies    Gluten meal and Lactose intolerance (gi)    Review of Systems   Review of Systems  Cardiovascular:  Positive for chest pain.    Physical Exam Updated Vital Signs BP (!) 135/96   Pulse 86   Temp 98.4 F (36.9 C) (Oral)   Resp 19   Ht 6\' 3"  (1.905 m)   Wt 122.9 kg   SpO2 92%   BMI 33.87 kg/m  Physical Exam Vitals and nursing note reviewed.  HENT:     Head: Normocephalic and atraumatic.  Eyes:     Pupils: Pupils are equal, round, and reactive to light.  Cardiovascular:     Rate and Rhythm: Normal rate and regular rhythm.  Pulmonary:     Effort: Pulmonary effort is normal.     Breath sounds: Normal breath sounds.  Abdominal:     Palpations: Abdomen is soft.     Tenderness: There is no abdominal tenderness.  Skin:    General: Skin is warm and dry.  Neurological:     Mental Status: He is alert.  Psychiatric:        Mood and Affect: Mood normal.     ED Results / Procedures / Treatments   Labs (all labs ordered are listed, but only abnormal results are displayed) Labs Reviewed  CBC - Abnormal; Notable for the following components:      Result Value   WBC 10.7 (*)    All other components within normal limits  BASIC METABOLIC PANEL WITH GFR - Abnormal; Notable for the following components:   Glucose, Bld 156 (*)    All other components within normal limits  TROPONIN I (HIGH SENSITIVITY)  TROPONIN I (HIGH SENSITIVITY)    EKG EKG Interpretation Date/Time:  Monday September 02 2023 13:27:29 EDT Ventricular Rate:  94 PR Interval:  214 QRS Duration:  98 QT Interval:  352 QTC Calculation: 440 R Axis:   127  Text Interpretation: Sinus rhythm with 1st degree A-V block Left posterior fascicular block  Inferior infarct Anterior infarct Appears similar to ECG of Jun 30 2022 Confirmed by Rafael Bun [phone]) on 09/02/2023 5:25:49 PM  Radiology DG Chest 1 View Result Date: 09/02/2023 CLINICAL DATA:  Chest pain EXAM: CHEST  1 VIEW portable COMPARISON:  X-ray 06/30/2022. FINDINGS: No consolidation, pneumothorax or effusion. Normal cardiopericardial silhouette without edema. Right shoulder arthroplasty. Air-fluid level along the stomach beneath the left hemidiaphragm. IMPRESSION: No acute cardiopulmonary  disease. Electronically Signed   By: Adrianna Horde M.D.   On: 09/02/2023 16:47    Procedures Procedures    Medications Ordered in ED Medications  acetaminophen (TYLENOL) tablet 975 mg (has no administration in time range)    ED Course/ Medical Decision Making/ A&P Clinical Course as of 09/02/23 1750  Mon Sep 02, 2023  1749 Initial troponin of 4 with delta of 5.  Low special for ACS.  No other significant laboratory abnormalities.  No evidence of dysrhythmia on EKG.  Give Tylenol for analgesia.  Counseled patient to decrease energy drink intake and follow-up with his cardiologist.  Stable for discharge at this time. [MP]    Clinical Course User Index [MP] Sallyanne Creamer, DO                                 Medical Decision Making 48 year old male with history as above presenting to the ED for intermittent chest pain over the last few days.  Experiencing chest pain yesterday after exercising.  Pain this morning as well.  Has been drinking several energy drinks a day.  No nausea vomiting.  Benign physical exam with systolic blood pressure in the 130s.  Normal heart rate.  Afebrile.  Differential diagnosis includes ACS, dysrhythmia, pneumonia, viral respiratory illness.  PERC negative low suspicion for PE.  Will obtain laboratory workup including high-sensitivity troponin, delta troponin, EKG chest x-ray and continue to monitor  Risk OTC drugs.           Final Clinical  Impression(s) / ED Diagnoses Final diagnoses:  Chest pain, unspecified type  History of myocardial infarction    Rx / DC Orders ED Discharge Orders     None         Sallyanne Creamer, DO 09/02/23 1750

## 2023-09-08 ENCOUNTER — Other Ambulatory Visit: Payer: Self-pay | Admitting: Internal Medicine

## 2023-09-08 DIAGNOSIS — E1165 Type 2 diabetes mellitus with hyperglycemia: Secondary | ICD-10-CM

## 2023-09-16 ENCOUNTER — Encounter: Payer: Self-pay | Admitting: Physician Assistant

## 2023-09-16 ENCOUNTER — Ambulatory Visit (INDEPENDENT_AMBULATORY_CARE_PROVIDER_SITE_OTHER): Payer: Medicare HMO | Admitting: Physician Assistant

## 2023-09-16 DIAGNOSIS — F250 Schizoaffective disorder, bipolar type: Secondary | ICD-10-CM | POA: Diagnosis not present

## 2023-09-16 DIAGNOSIS — F411 Generalized anxiety disorder: Secondary | ICD-10-CM

## 2023-09-16 DIAGNOSIS — F902 Attention-deficit hyperactivity disorder, combined type: Secondary | ICD-10-CM | POA: Diagnosis not present

## 2023-09-16 DIAGNOSIS — F17200 Nicotine dependence, unspecified, uncomplicated: Secondary | ICD-10-CM

## 2023-09-16 MED ORDER — GABAPENTIN 800 MG PO TABS: ORAL_TABLET | ORAL | 5 refills | Status: DC

## 2023-09-16 MED ORDER — VALBENAZINE TOSYLATE 80 MG PO CAPS: 80.0000 mg | ORAL_CAPSULE | Freq: Every day | ORAL | 5 refills | Status: DC

## 2023-09-16 MED ORDER — DULOXETINE HCL 60 MG PO CPEP: 120.0000 mg | ORAL_CAPSULE | Freq: Every day | ORAL | 1 refills | Status: DC

## 2023-09-16 MED ORDER — ARIPIPRAZOLE 20 MG PO TABS: 20.0000 mg | ORAL_TABLET | Freq: Every day | ORAL | Status: DC

## 2023-09-16 NOTE — Progress Notes (Signed)
 [facility]  Patient ID: Matthew [name],  MRN: [id number]  PCP: Daved Eriksson, MD  Date of Evaluation: 09/16/2023 Time spent:30 minutes  Chief Complaint:  Chief Complaint   Anxiety; Depression; Follow-up    HISTORY/CURRENT STATUS: HPI For routine med check.   He's doing really well.  Feels like this combo of drugs is helping a lot. Not anxious like he used to be. No PA. Gabapentin  really helps prevent the anxiety.   Patient is able to enjoy things.  He is doing more things that he used to enjoy like playing his guitar and bass.  Energy and motivation are good.  No extreme sadness, tearfulness, or feelings of hopelessness.  Continues to grieve the loss of his mom who died last year but he does not feel that it is abnormal.  Sleeps well most of the time. ADLs and personal hygiene are normal.   Denies any changes in memory.  Appetite has not changed.  Weight is stable.  Denies suicidal or homicidal thoughts.  States that attention is good without easy distractibility.  Able to focus on things and finish tasks to completion.   Patient denies increased energy with decreased need for sleep, increased talkativeness, racing thoughts, impulsivity or risky behaviors, increased spending, increased libido, grandiosity, increased irritability or anger, paranoia, or hallucinations.  TD symptoms are treated well with Ingrezza .  Denies dizziness, syncope, seizures, numbness, tingling, tremor, tics, unsteady gait, slurred speech, confusion. Denies muscle or joint pain, stiffness, or dystonia. Denies unexplained weight loss, frequent infections, or sores that heal slowly.  No polyphagia, polydipsia, or polyuria. Denies visual changes or paresthesias.   Individual Medical History/ Review of Systems: Changes? :Yes    fatty liver dx  Past medications for mental health diagnoses include: Risperdal, Zyprexa , Abilify , [name] , Klonopin , Gabapentin , Xanax , Valium, Ativan ,  Hydroxyzine , Trilafon , Atenolol , Cymbalta , Prozac, Lexapro, Paxil, Lithium , Adderall, Ritalin, Mirtazepine, Sonata  was ineffective.  Austedo  helped some, Strattera , Quelbree caused headaches.  Ingrezza   Allergies: Gluten meal and Lactose intolerance (gi)  Current Medications:  Current Outpatient Medications:    ARIPiprazole  (ABILIFY ) 20 MG tablet, TAKE 1 TABLET BY MOUTH EVERY DAY, Disp: 90 tablet, Rfl: 1   aspirin  EC 81 MG tablet, Take 1 tablet (81 mg total) by mouth daily. Swallow whole., Disp: 30 tablet, Rfl: 0   atomoxetine  (STRATTERA ) 100 MG capsule, TAKE 1 CAPSULE BY MOUTH EVERY DAY, Disp: 90 capsule, Rfl: 1   atorvastatin  (LIPITOR ) 80 MG tablet, Take 1 tablet (80 mg total) by mouth daily., Disp: 90 tablet, Rfl: 3   Bempedoic Acid-Ezetimibe  (NEXLIZET ) 180-10 MG TABS, Take 1 tablet by mouth daily., Disp: 90 tablet, Rfl: 3   carvedilol  (COREG ) 12.5 MG tablet, Take 3 tablets (37.5 mg total) by mouth 2 (two) times daily., Disp: 540 tablet, Rfl: 3   ciclopirox  (PENLAC ) 8 % solution, Apply topically at bedtime. Apply over nail and surrounding skin. Apply daily over previous coat. After seven (7) days, may remove with alcohol and continue cycle., Disp: 6.6 mL, Rfl: 2   clopidogrel  (PLAVIX ) 75 MG tablet, Take 1 tablet (75 mg total) by mouth daily., Disp: 90 tablet, Rfl: 3   empagliflozin  (JARDIANCE ) 25 MG TABS tablet, Take 1 tablet (25 mg total) by mouth every morning., Disp: 90 tablet, Rfl: 3   fenofibrate  (TRICOR ) 145 MG tablet, Take 1 tablet (145 mg total) by mouth daily., Disp: 90 tablet, Rfl: 3   hydrOXYzine  (ATARAX ) 50 MG tablet, TAKE 1 TABLET BY MOUTH EVERY 6 HOURS AS NEEDED, Disp: 360  tablet, Rfl: 0   icosapent  Ethyl (VASCEPA ) 1 g capsule, Take 2 capsules (2 g total) by mouth 2 (two) times daily., Disp: 360 capsule, Rfl: 3   insulin  glargine (LANTUS  SOLOSTAR) 100 UNIT/ML Solostar Pen, Inject 22 Units into the skin daily., Disp: 30 mL, Rfl: 3   Insulin  Pen Needle 32G X 4 MM MISC, 1 Device by  Does not apply route daily in the afternoon., Disp: 100 each, Rfl: 3   metFORMIN  (GLUCOPHAGE -XR) 750 MG 24 hr tablet, Take 2 tablets (1,500 mg total) by mouth daily with breakfast., Disp: 180 tablet, Rfl: 3   Semaglutide , 1 MG/DOSE, 4 MG/3ML SOPN, Inject 1 mg as directed once a week., Disp: 9 mL, Rfl: 3   DULoxetine  (CYMBALTA ) 60 MG capsule, Take 2 capsules (120 mg total) by mouth daily., Disp: 180 capsule, Rfl: 1   gabapentin  (NEURONTIN ) 800 MG tablet, TAKE 1 TABLET BY MOUTH 4 TIMES A DAY WITH AN EXTRA 1/2 TABLET DURING THE DAY AS NEEDED, Disp: 135 tablet, Rfl: 5   traMADol (ULTRAM) 50 MG tablet, Take 50 mg by mouth 2 (two) times daily as needed. (Patient not taking: Reported on 09/16/2023), Disp: , Rfl:    traMADol (ULTRAM) 50 MG tablet, 1 tablet as needed Orally twice a day for 7 days (Patient not taking: Reported on 09/16/2023), Disp: , Rfl:    urea  (GORDONS UREA ) 40 % CREA, Apply 1 Application topically 2 (two) times daily. Apply to right foot callus (Patient not taking: Reported on 09/16/2023), Disp: 198 g, Rfl: 1   valbenazine  (INGREZZA ) 80 MG capsule, Take 1 capsule (80 mg total) by mouth daily., Disp: 30 capsule, Rfl: 5 Medication Side Effects: none  Family Medical/ Social History: Changes? Mom died 04-07-23. Sister dx w/ leukemia  MENTAL HEALTH EXAM:  There were no vitals taken for this visit.There is no height or weight on file to calculate BMI.  General Appearance: Casual and Well Groomed  Eye Contact:  Good  Speech:  Clear and Coherent and Normal Rate  Volume:  Normal  Mood:  Euthymic  Affect:  Congruent  Thought Process:  Goal Directed and Descriptions of Associations: Circumstantial  Orientation:  Full (Time, Place, and Person)  Thought Content: Logical   Suicidal Thoughts:  No  Homicidal Thoughts:  No  Memory:  WNL  Judgement:  Fair  Insight:  Good  Psychomotor Activity:  Normal  Concentration:  Concentration: Good and Attention Span: Fair  Recall:  Good  Fund of  Knowledge: Good  Language: Good  Assets:  Desire for Improvement Financial Resources/Insurance Housing Transportation  ADL's:  Intact  Cognition: WNL  Prognosis:  Good         AIMS    Flowsheet Row Office Visit from 06/18/2023 in [facility] Office Visit from 03/18/2023 in [facility] Office Visit from 02/18/2023 in [facility] Office Visit from 05/30/2022 in [facility] Office Visit from 04/20/2022 in [facility]  AIMS Total Score 1 2 8 3 7       PHQ2-9    Flowsheet Row CARDIAC REHAB PHASE II ORIENTATION from 08/16/2020 in [facility]  PHQ-2 Total Score 0      Flowsheet Row ED from 09/02/2023 in [facility] Emergency Department at [facility] ED to Hosp-Admission (Discharged) from 06/30/2022 in [place] [facility] ED to Hosp-Admission (Discharged) from 06/01/2022 in [place]  [facility] 59M KIDNEY UNIT  C-SSRS RISK CATEGORY No Risk No Risk No Risk      Labs from 07/29/2023 and 09/02/2023 were reviewed. PCP and cardiology treat abnormal labs.  DIAGNOSES:  No diagnosis found.  Receiving Psychotherapy: No   RECOMMENDATIONS:  PDMP reviewed.  Gabapentin  filled 09/09/2023.  Also on tramadol.   I provided 30 minutes of face to face time during this encounter, including time spent before and after the visit in records review, medical decision making, counseling pertinent to today's visit, and charting.   From a mental health standpoint he is doing really well.  No changes need to be made.  Smoking cessation briefly discussed.  Continue Abilify  20 mg, 1 p.o. daily. Continue Strattera  100 mg, 1 p.o. daily. Continue Cymbalta  60 mg, 2 p.o. daily. Continue gabapentin  800 mg 1 p.o. 4 times daily with 1/2 pill mid day.  Continue hydroxyzine  50 mg, 1 p.o. 4 times daily as  needed.   Continue Ingrezza  80 mg daily.  Strongly recommend counseling. Return in 4 months.  Marvia Slocumb, PA-C

## 2023-09-19 ENCOUNTER — Telehealth (HOSPITAL_BASED_OUTPATIENT_CLINIC_OR_DEPARTMENT_OTHER): Payer: Self-pay | Admitting: *Deleted

## 2023-09-19 NOTE — Telephone Encounter (Signed)
   Pre-operative Risk Assessment    Patient Name: Matthew [name]  DOB: 19-Jun-1975 MRN: [id number]   Date of last office visit: 04/05/2023 Date of next office visit: None  Request for Surgical Clearance    Procedure:   Colonoscopy  Date of Surgery:  Clearance 10/16/23                                 Surgeon:  Dr.Brahmbhatt Surgeon's Group or Practice Name:  [facility] Phone number:  [phone] Fax number:  [phone]   Type of Clearance Requested:   - Medical  - Pharmacy:  Hold Aspirin  and Clopidogrel  (Plavix ) 5 days prior   Type of Anesthesia:   Propofol    Additional requests/questions:    Signed, Lauris Port   09/19/2023, 4:28 PM

## 2023-09-20 NOTE — Telephone Encounter (Signed)
   Name: Matthew [name]  DOB: Dec 26, 1975  MRN: [id number]  Primary Cardiologist: Gaylyn Keas, MD  Chart reviewed as part of pre-operative protocol coverage. Because of [name]'s past medical history and time since last visit, he will require a follow-up in-office visit in order to better assess preoperative cardiovascular risk.  Was seen in the ED on 09/02/2023 for chest pains and told to follow-up with cardiology.   Pre-op covering staff: - Please schedule appointment and call patient to inform them. If patient already had an upcoming appointment within acceptable timeframe, please add "pre-op clearance" to the appointment notes so provider is aware. - Please contact requesting surgeon's office via preferred method (i.e, phone, fax) to inform them of need for appointment prior to surgery.  He may hold Plavix  for 5 days prior to procedure. Please resume Plavix  as soon as possible postprocedure, at the discretion of the surgeon.  Regarding Matthew therapy, we recommend continuation of Matthew throughout the perioperative period.  However, if the surgeon feels that cessation of Matthew is required in the perioperative period, it may be stopped 5-7 days prior to surgery with a plan to resume it as soon as felt to be feasible from a surgical standpoint in the post-operative period.   Ava Boatman, NP  09/20/2023, 8:59 AM

## 2023-09-20 NOTE — Telephone Encounter (Signed)
 Pt has been scheduled to see Charles Connor, NP 10/04/23 8:50 at the new office location [facility] for preop clearance.

## 2023-10-03 NOTE — Progress Notes (Unsigned)
 Cardiology Office Note    Patient Name: Matthew [name] Date of Encounter: 10/03/2023  Primary Care Provider:  Daved Eriksson, MD Primary Cardiologist:  Gaylyn Keas, MD Primary Electrophysiologist: None   Past Medical History    Past Medical History:  Diagnosis Date   Anxiety    Arthritis    Celiac disease    Coronary artery disease    Dairy allergy    Depression    Diabetes mellitus without complication (HCC)    High cholesterol    Hypertension    Hypothyroidism    Myocardial infarction (HCC)    OSA (obstructive sleep apnea)    Paranoia (HCC)    Schizoaffective disorder (HCC)     History of Present Illness  Matthew [name] is a 48 y.o. male with a PMH of CAD s/p NSTEMI 2022 with DES to pLAD and DES to dRCA, HTN, HLD, prediabetes, celiac disease, schizoaffective disorder with anxiety, ADHD, OSA (not on CPAP) who presents today for preoperative clearance.  Mr. [name] was seen initially in 2022 when he presented to the ED with complaint of chest chest pain and found to have elevated troponins.  He underwent LHC by Dr. [place] and found to have 90% the RCA treated with DES x 1 and 99% pLAD treated with DES x 1.  EF was estimated at 25-35% by visual estimation he was started on ASA 81 mg and Brilinta  90 mg twice daily.  He unfortunately left AMA prior to discharge.  He was seen in follow-up by Herbert Loh, PA on 06/08/2020 with elevated heart rate and blood pressure.  He reported compliance with DAPT and was advised to maintain compliance.  He was having complaint of palpitations and wore an event monitor on 06/22/2020 and overall showed no significant arrhythmias.  He was seen in the ED on 08/10/2020 with complaint of atypical chest pain and periumbilical pain.  2D echo was completed showing improved EF of 65 to 70% with mild LVH.  ACS workup was negative and patient was discharged and seen back in the ED on 08/31/2020 with complaint of chest pain and tachycardia.  He was not taking his  Imdur  nitro Jardiance  or insulin .  He was seen in office on 10/10/2020 he was doing well from a cardiovascular standpoint.  Coreg  was increased to 25 mg and patient was advised to discontinue smoking.  He was seen in follow-up on 11/30/2020.  He reported symptoms reminiscent of his previous Lexiscan Myoview was ordered for further evaluation but not completed.  He was seen on 02/24/2021 with complaint of chest pain and shortness of breath.  He was admitted and underwent left heart cath showing patent stents and nonobstructive disease.  He was continued on medical therapy.  Return to the ED on 04/23/2021 complaint of chest pain with syncope.  EKG was completed showing no changes and syncope was felt to be related to neurogenic/vagal response.  2-week ZIO was ordered and showed no malignant arrhythmias or pauses.  He was seen 05/31/2021 in the ED for syncope sharp mid lower chest pain.  He had a Lexiscan Myoview ordered but was not performed.  He was seen by Dr. Ardell Beauvais on 08/09/2021 and reported doing well but stopped taking lisinopril  due to coughing episodes.  He was continued on current GDMT with no further med changes made.  He was last seen by Dr. Micael Adas to establish care after transitioning from Dr. Ardell Beauvais and Dr. Jacquelynn Matter.  During visit patient reported doing well complaints of chest pain  dizziness.  He previously tried CPAP but did not tolerate.  There were no changes made to medication regimen.  He presents today for surgical clearance prior to colonoscopy.  Discussed the use of AI scribe software for clinical note transcription with the patient, who gave verbal consent to proceed.  History of Present Illness    ***Notes: Patient can hold Plavix  5 days prior to procedure and should continue aspirin  if possible through the perioperative.   Review of Systems  Please see the history of present illness.    All other systems reviewed and are otherwise negative except as noted above.  Physical Exam      Wt Readings from Last 3 Encounters:  09/02/23 271 lb (122.9 kg)  07/04/23 271 lb (122.9 kg)  04/05/23 271 lb (122.9 kg)   ZO:XWRUE were no vitals filed for this visit.,There is no height or weight on file to calculate BMI. GEN: Well nourished, well developed in no acute distress Neck: No JVD; No carotid bruits Pulmonary: Clear to auscultation without rales, wheezing or rhonchi  Cardiovascular: Normal rate. Regular rhythm. Normal S1. Normal S2.   Murmurs: There is no murmur.  ABDOMEN: Soft, non-tender, non-distended EXTREMITIES:  No edema; No deformity   EKG/LABS/ Recent Cardiac Studies   ECG personally reviewed by me today - ***  Risk Assessment/Calculations:   {Does this patient have ATRIAL FIBRILLATION?:[phone]}      Lab Results  Component Value Date   WBC 10.7 (H) 09/02/2023   HGB 15.5 09/02/2023   HCT 46.5 09/02/2023   MCV 86.9 09/02/2023   PLT 294 09/02/2023   Lab Results  Component Value Date   CREATININE 1.08 09/02/2023   BUN 20 09/02/2023   NA 135 09/02/2023   K 4.7 09/02/2023   CL 100 09/02/2023   CO2 24 09/02/2023   Lab Results  Component Value Date   CHOL 183 02/08/2023   HDL 16 (L) 02/08/2023   LDLCALC 63 02/08/2023   LDLDIRECT 49 02/08/2023   TRIG 683 (HH) 02/08/2023   CHOLHDL 11.4 (H) 02/08/2023    Lab Results  Component Value Date   HGBA1C 7.2 (A) 07/04/2023   Assessment & Plan    Assessment and Plan Assessment & Plan     1.  Preoperative clearance: - Patient's RCRI score is 6.6%  2.  Coronary artery disease  3.  Essential hypertension  4.  Hyperlipidemia  5.HFimEF/ICM      Disposition: Follow-up with Gaylyn Keas, MD or APP in *** months {Are you ordering a CV Procedure (e.g. stress test, cath, DCCV, TEE, etc)?   Press F2        :[id number]}   Signed, Francene Ing, Retha Cast, NP 10/03/2023, 7:26 AM [facility]

## 2023-10-04 ENCOUNTER — Ambulatory Visit: Attending: Nurse Practitioner | Admitting: Nurse Practitioner

## 2023-10-04 ENCOUNTER — Encounter: Payer: Self-pay | Admitting: Nurse Practitioner

## 2023-10-04 VITALS — BP 110/72 | HR 91 | Ht 75.0 in | Wt 263.6 lb

## 2023-10-04 DIAGNOSIS — I25118 Atherosclerotic heart disease of native coronary artery with other forms of angina pectoris: Secondary | ICD-10-CM

## 2023-10-04 DIAGNOSIS — I10 Essential (primary) hypertension: Secondary | ICD-10-CM

## 2023-10-04 DIAGNOSIS — E782 Mixed hyperlipidemia: Secondary | ICD-10-CM

## 2023-10-04 DIAGNOSIS — Z01810 Encounter for preprocedural cardiovascular examination: Secondary | ICD-10-CM | POA: Diagnosis not present

## 2023-10-04 DIAGNOSIS — I255 Ischemic cardiomyopathy: Secondary | ICD-10-CM | POA: Diagnosis not present

## 2023-10-04 DIAGNOSIS — G4733 Obstructive sleep apnea (adult) (pediatric): Secondary | ICD-10-CM

## 2023-10-04 MED ORDER — ICOSAPENT ETHYL 1 G PO CAPS: 2.0000 g | ORAL_CAPSULE | Freq: Two times a day (BID) | ORAL | 3 refills | Status: AC

## 2023-10-04 NOTE — Patient Instructions (Signed)
 Medication Instructions:  Your physician recommends that you continue on your current medications as directed. Please refer to the Current Medication list given to you today. *If you need a refill on your cardiac medications before your next appointment, please call your pharmacy*  Lab Work: NONE ORDERED If you have labs (blood work) drawn today and your tests are completely normal, you will receive your results only by: MyChart Message (if you have MyChart) OR A paper copy in the mail If you have any lab test that is abnormal or we need to change your treatment, we will call you to review the results.  Testing/Procedures: NONE ORDERED  Follow-Up: At [facility], you and your health needs are our priority.  As part of our continuing mission to provide you with exceptional heart care, our providers are all part of one team.  This team includes your primary Cardiologist (physician) and Advanced Practice Providers or APPs (Physician Assistants and Nurse Practitioners) who all work together to provide you with the care you need, when you need it.  Your next appointment:   12 month(s)  Provider:   Gaylyn Keas, MD    We recommend signing up for the patient portal called "MyChart".  Sign up information is provided on this After Visit Summary.  MyChart is used to connect with patients for Virtual Visits (Telemedicine).  Patients are able to view lab/test results, encounter notes, upcoming appointments, etc.  Non-urgent messages can be sent to your provider as well.   To learn more about what you can do with MyChart, go to [email or web address].   Other Instructions Check your blood pressure daily for 2 weeks, then contact the office with your readings.  Contact the office either by phone or MyChart with your readings.  Make sure to check your blood pressure 2 hours after taking your medications.   AVOID these things for 30 minutes before checking your blood pressure: No Drinking  caffeine. No Drinking alcohol. No Eating. No Smoking. No Exercising.  Five minutes before checking your blood pressure: Pee. Sit in a dining chair. Avoid sitting in a soft couch or armchair. Be quiet. Do not talk.

## 2023-10-11 ENCOUNTER — Telehealth: Payer: Self-pay

## 2023-10-11 NOTE — Telephone Encounter (Signed)
**Note De-Identified Matthew [name] Obfuscation** Per the [employer] provider portal, this Split Night Sleep Study PA is approved from 11/04/2023 - 01/03/2024. Authorization #[id number]  Tracking [id number]  I have transferred the order to the sleep lab with a message attached advising "URGENT" please contact the pt to schedule as approval date ends on 01/03/24".  I called the pt and made him aware of this approval and I provided him with the sleep lab's phone number so he can contact them to schedule the test if they have not contacted him within a week or two.

## 2023-11-04 ENCOUNTER — Ambulatory Visit (INDEPENDENT_AMBULATORY_CARE_PROVIDER_SITE_OTHER): Payer: Medicare HMO | Admitting: Internal Medicine

## 2023-11-04 ENCOUNTER — Encounter: Payer: Self-pay | Admitting: Internal Medicine

## 2023-11-04 VITALS — BP 118/76 | HR 94 | Ht 75.0 in | Wt 262.0 lb

## 2023-11-04 DIAGNOSIS — E1165 Type 2 diabetes mellitus with hyperglycemia: Secondary | ICD-10-CM

## 2023-11-04 DIAGNOSIS — Z794 Long term (current) use of insulin: Secondary | ICD-10-CM

## 2023-11-04 DIAGNOSIS — E1142 Type 2 diabetes mellitus with diabetic polyneuropathy: Secondary | ICD-10-CM

## 2023-11-04 LAB — POCT GLYCOSYLATED HEMOGLOBIN (HGB A1C): Hemoglobin A1C: 6.4 % — AB (ref 4.0–5.6)

## 2023-11-04 MED ORDER — OZEMPIC (1 MG/DOSE) 2 MG/1.5ML ~~LOC~~ SOPN: 2.0000 mg | PEN_INJECTOR | SUBCUTANEOUS | 3 refills | Status: DC

## 2023-11-04 MED ORDER — EMPAGLIFLOZIN 25 MG PO TABS: 25.0000 mg | ORAL_TABLET | Freq: Every morning | ORAL | 3 refills | Status: DC

## 2023-11-04 MED ORDER — LANTUS SOLOSTAR 100 UNIT/ML ~~LOC~~ SOPN: 20.0000 [IU] | PEN_INJECTOR | Freq: Every day | SUBCUTANEOUS | 4 refills | Status: DC

## 2023-11-04 MED ORDER — METFORMIN HCL ER 750 MG PO TB24: 1500.0000 mg | ORAL_TABLET | Freq: Every day | ORAL | 3 refills | Status: DC

## 2023-11-04 NOTE — Patient Instructions (Addendum)
-   Increase Ozempic  2 mg weekly  - Continue  Metformin  750 mg ,2  tablet before Breakfast  - Continue  Jardiance  25 mg, 1 tablet before Breakfast  - Decrease  Lantus  20  units once daily      HOW TO TREAT LOW BLOOD SUGARS (Blood sugar LESS THAN 70 MG/DL) Please follow the RULE OF 15 for the treatment of hypoglycemia treatment (when your (blood sugars are less than 70 mg/dL)   STEP 1: Take 15 grams of carbohydrates when your blood sugar is low, which includes:  3-4 GLUCOSE TABS  OR 3-4 OZ OF JUICE OR REGULAR SODA OR ONE TUBE OF GLUCOSE GEL    STEP 2: RECHECK blood sugar in 15 MINUTES STEP 3: If your blood sugar is still low at the 15 minute recheck --> then, go back to STEP 1 and treat AGAIN with another 15 grams of carbohydrates.

## 2023-11-04 NOTE — Progress Notes (Signed)
 Name: Matthew [name]  Age/ Sex: 48 y.o., male   MRN/ DOB: [id number], Jul 23, 1975     PCP: Daved Eriksson, MD   Reason for Endocrinology Evaluation: Type 2 Diabetes Mellitus  Initial Endocrine Consultative Visit: 08/15/2020    PATIENT IDENTIFIER: Matthew [name] is a 48 y.o. male with a past medical history of T2DN, CAD , OSA and schizoaffective d/o. The patient has followed with Endocrinology clinic since 08/15/2020 for consultative assistance with management of his diabetes.  DIABETIC HISTORY:  Matthew [name] was diagnosed with DM 2021,he has been on metformin  and jardiance  . His hemoglobin A1c has ranged from 6.4% in 2020, peaking at 12.8% in 2022.   Pt on disability for mental health   Started Ozempic  03/2023 with an A1c of 9.4%     SUBJECTIVE:   During the last visit (07/04/2023): A1c 7.2 %  Today (11/04/2023): Matthew [name] is here for a diabetes management.  He checks his blood sugars multiple  times daily, through CGM.    Patient continues to follow-up with psychiatry Patient continues to follow-up with cardiology for CAD, ischemic cardiomyopathy and dyslipidemia  He was evaluated for elevated LFTs and left-sided chest pain.  Right upper quadrant ultrasound showed hepatomegaly, no gallstones or sludge  He had a follow-up with podiatry for onychomycosis  08/26/2023   Patient has been noted weight loss Denies nausea, or vomiting  Denies  but no diarrhea   HOME DIABETES REGIMEN:  Metformin  750 mg , 2 tablets daily  Jardiance  25 mg, 1 tablet before Breakfast Ozempic  1 mg weekly Lantus   22 units daily      Statin: yes ACE-I/ARB: yes   CONTINUOUS GLUCOSE MONITORING RECORD INTERPRETATION    Dates of Recording: 6/3-6/16/2025  Sensor description: dexcom  Results statistics:   CGM use % of time 95  Average and SD 143/29  Time in range 90  %  % Time Above 180 9  % Time above 250 1  % Time Below target 0   Glycemic patterns summary: BGs are optimal throughout  the day and night  Hyperglycemic episodes postprandial  Hypoglycemic episodes occurred N/A  Overnight periods: Optimal    DIABETIC COMPLICATIONS: Microvascular complications:  Neuropathy Denies: CKD, retinopathy Last Eye Exam: past due   Macrovascular complications:  CAD Denies: CVA, PVD   HISTORY:  Past Medical History:  Past Medical History:  Diagnosis Date   Anxiety    Arthritis    Celiac disease    Coronary artery disease    Dairy allergy    Depression    Diabetes mellitus without complication (HCC)    High cholesterol    Hypertension    Hypothyroidism    Myocardial infarction (HCC)    OSA (obstructive sleep apnea)    Paranoia (HCC)    Schizoaffective disorder (HCC)    Past Surgical History:  Past Surgical History:  Procedure Laterality Date   APPENDECTOMY     CARDIAC CATHETERIZATION     CORONARY STENT INTERVENTION N/A 06/06/2020   Procedure: CORONARY STENT INTERVENTION;  Surgeon: Swaziland, Peter M, MD;  Location: [facility] INVASIVE CV LAB;  Service: Cardiovascular;  Laterality: N/A;  prox LAD, distal RCA   CORONARY ULTRASOUND/IVUS N/A 06/06/2020   Procedure: Intravascular Ultrasound/IVUS;  Surgeon: Swaziland, Peter M, MD;  Location[facility] INVASIVE CV LAB;  Service: Cardiovascular;  Laterality: N/A;   LEFT HEART CATH AND CORONARY ANGIOGRAPHY N/A 06/06/2020   Procedure: LEFT HEART CATH AND CORONARY ANGIOGRAPHY;  Surgeon: Swaziland, Peter M, MD;  [facility]  INVASIVE CV LAB;  Service: Cardiovascular;  Laterality: N/A;   LEFT HEART CATH AND CORONARY ANGIOGRAPHY N/A 02/24/2021   Procedure: LEFT HEART CATH AND CORONARY ANGIOGRAPHY;  Surgeon: Arty Binning, MD;  Location: [facility] INVASIVE CV LAB;  Service: Cardiovascular;  Laterality: N/A;   NASAL SEPTUM SURGERY     TOTAL SHOULDER ARTHROPLASTY Right 11/28/2021   Procedure: TOTAL SHOULDER ARTHROPLASTY;  Surgeon: Osa Blase, MD;  Location: [facility] ORS;  Service: Orthopedics;  Laterality: Right;   WRIST FRACTURE SURGERY     right wrist    Social History:  reports that he has been smoking cigarettes. He has a 1 pack-year smoking history. He has never used smokeless tobacco. He reports that he does not drink alcohol and does not use drugs. Family History:  Family History  Problem Relation Age of Onset   Asthma Mother    Hypertension Mother    CVA Mother    Hypertension Father    Leukemia Sister      HOME MEDICATIONS: Allergies as of 11/04/2023       Reactions   Gluten Meal Other (See Comments)   Celiac disease   Lactose Intolerance (gi) Diarrhea   Can tolerate hard cheese        Medication List        Accurate as of November 04, 2023 10:42 AM. If you have any questions, ask your nurse or doctor.          ARIPiprazole  20 MG tablet Commonly known as: ABILIFY  Take 1 tablet (20 mg total) by mouth daily.   aspirin  EC 81 MG tablet Take 1 tablet (81 mg total) by mouth daily. Swallow whole.   atomoxetine  100 MG capsule Commonly known as: STRATTERA  TAKE 1 CAPSULE BY MOUTH EVERY DAY   atorvastatin  80 MG tablet Commonly known as: LIPITOR  Take 1 tablet (80 mg total) by mouth daily.   carvedilol  12.5 MG tablet Commonly known as: COREG  Take 3 tablets (37.5 mg total) by mouth 2 (two) times daily.   ciclopirox  8 % solution Commonly known as: PENLAC  Apply topically at bedtime. Apply over nail and surrounding skin. Apply daily over previous coat. After seven (7) days, may remove with alcohol and continue cycle.   clopidogrel  75 MG tablet Commonly known as: PLAVIX  Take 1 tablet (75 mg total) by mouth daily.   DULoxetine  60 MG capsule Commonly known as: Cymbalta  Take 2 capsules (120 mg total) by mouth daily.   empagliflozin  25 MG Tabs tablet Commonly known as: Jardiance  Take 1 tablet (25 mg total) by mouth every morning.   fenofibrate  145 MG tablet Commonly known as: Tricor  Take 1 tablet (145 mg total) by mouth daily.   gabapentin  800 MG tablet Commonly known as: NEURONTIN  TAKE 1 TABLET BY MOUTH 4  TIMES A DAY WITH AN EXTRA 1/2 TABLET DURING THE DAY AS NEEDED   hydrOXYzine  50 MG tablet Commonly known as: ATARAX  TAKE 1 TABLET BY MOUTH EVERY 6 HOURS AS NEEDED   icosapent  Ethyl 1 g capsule Commonly known as: Vascepa  Take 2 capsules (2 g total) by mouth 2 (two) times daily.   Insulin  Pen Needle 32G X 4 MM Misc 1 Device by Does not apply route daily in the afternoon.   Lantus  SoloStar 100 UNIT/ML Solostar Pen Generic drug: insulin  glargine Inject 22 Units into the skin daily.   metFORMIN  750 MG 24 hr tablet Commonly known as: GLUCOPHAGE -XR Take 2 tablets (1,500 mg total) by mouth daily with breakfast.   Nexlizet  180-10 MG Tabs Generic  drug: Bempedoic Acid-Ezetimibe  Take 1 tablet by mouth daily.   Semaglutide  (1 MG/DOSE) 4 MG/3ML Sopn Inject 1 mg as directed once a week.   valbenazine  80 MG capsule Commonly known as: INGREZZA  Take 1 capsule (80 mg total) by mouth daily.         OBJECTIVE:   Vital Signs: BP 118/76 (BP Location: Left Arm, Patient Position: Sitting, Cuff Size: Normal)   Pulse 94   Ht 6' 3 (1.905 m)   Wt 262 lb (118.8 kg)   SpO2 98%   BMI 32.75 kg/m   Wt Readings from Last 3 Encounters:  11/04/23 262 lb (118.8 kg)  10/04/23 263 lb 9.6 oz (119.6 kg)  09/02/23 271 lb (122.9 kg)     Exam: General: Pt appears well and is in NAD  Lungs: Clear with good BS bilat   Heart: RRR   Abdomen: soft, nontender  Extremities: No pretibial edema.   Neuro: MS is good with appropriate affect, pt is alert and Ox3    DM foot exam: 08/26/2023 per podiatry    DATA REVIEWED:  Lab Results  Component Value Date   HGBA1C 6.4 (A) 11/04/2023   HGBA1C 7.2 (A) 07/04/2023   HGBA1C 9.4 (A) 03/25/2023    Latest Reference Range & Units 09/02/23 15:00  Sodium 135 - 145 mmol/L 135  Potassium 3.5 - 5.1 mmol/L 4.7  Chloride 98 - 111 mmol/L 100  CO2 22 - 32 mmol/L 24  Glucose 70 - 99 mg/dL 332 (H)  BUN 6 - 20 mg/dL 20  Creatinine 9.51 - 8.84 mg/dL 1.66  Calcium   8.9 - 10.3 mg/dL 9.7  Anion gap 5 - 15  11  GFR, Estimated >60 mL/min >60  (H): Data is abnormally high  ASSESSMENT / PLAN / RECOMMENDATIONS:   1) Type 2 Diabetes Mellitus, Optimally  controlled, With neuropathic  complications - Most recent A1c of 6.4 %. Goal A1c < 7.0 %.     -A1c remains at goal -I have encouraged the patient to continue with lifestyle changes -He is tolerating Ozempic , will increase - Will decrease insulin  as below preemptively, to prevent hypoglycemia   MEDICATIONS: Continue metformin  750 mg , 2 tablet before Breakfast  Continue  Jardiance  25 mg, 1 tablet before Breakfast  Decrease Lantus   20  units once daily  Increase Ozempic  2 mg weekly  EDUCATION / INSTRUCTIONS: BG monitoring instructions: Patient is instructed to check his blood sugars 1 times a day. Call [facility] if: BG persistently < 70  I reviewed the Rule of 15 for the treatment of hypoglycemia in detail with the patient. Literature supplied.   2) Diabetic complications:  Eye: Does not have known diabetic retinopathy.  Neuro/ Feet: Does not have known diabetic peripheral neuropathy .  Renal: Patient does not have known baseline CKD. He   is  on an ACEI/ARB at present.      3) Hypertriglyceridemia   - Per cardiology      F/U in 6 months     Signed electronically by: Natale Bail, MD  [facility]  [facility] [address] [place] [place], [place] [address] Phone: [phone] FAX: [phone]   CC: Daved Eriksson, MD [address] [place] [address] Phone: [phone]  Fax: [phone]  Return to Endocrinology clinic as below: Future Appointments  Date Time Provider Department Center  12/02/2023  1:15 PM Charity Conch, DPM [facility]  12/24/2023  8:00 PM Turner, Rufus Council,  MD [facility]  01/16/2024 11:30 AM Marvia Slocumb T, PA-C [facility] None

## 2023-11-05 ENCOUNTER — Encounter: Payer: Self-pay | Admitting: Internal Medicine

## 2023-11-05 ENCOUNTER — Ambulatory Visit: Payer: Self-pay | Admitting: Internal Medicine

## 2023-11-05 LAB — MICROALBUMIN / CREATININE URINE RATIO
Creatinine, Urine: 17 mg/dL — ABNORMAL LOW (ref 20–320)
Microalb Creat Ratio: 12 mg/g{creat} (ref <30)
Microalb, Ur: 0.2 mg/dL

## 2023-11-09 ENCOUNTER — Other Ambulatory Visit: Payer: Self-pay | Admitting: Physician Assistant

## 2023-11-11 ENCOUNTER — Other Ambulatory Visit: Payer: Self-pay

## 2023-11-11 MED ORDER — FENOFIBRATE 145 MG PO TABS: 145.0000 mg | ORAL_TABLET | Freq: Every day | ORAL | 3 refills | Status: AC

## 2023-11-29 ENCOUNTER — Other Ambulatory Visit: Payer: Self-pay | Admitting: Internal Medicine

## 2023-12-02 ENCOUNTER — Ambulatory Visit: Admitting: Podiatry

## 2023-12-06 ENCOUNTER — Ambulatory Visit: Admitting: Podiatry

## 2023-12-06 VITALS — Ht 75.0 in | Wt 262.0 lb

## 2023-12-06 DIAGNOSIS — B351 Tinea unguium: Secondary | ICD-10-CM | POA: Diagnosis not present

## 2023-12-06 MED ORDER — TAVABOROLE 5 % EX SOLN: 1.0000 [drp] | Freq: Every day | CUTANEOUS | 2 refills | Status: DC

## 2023-12-06 NOTE — Patient Instructions (Addendum)
 We can also use UREA  NAIL GEL on the thicker nails  --  Tavaborole Topical solution What is this medication? TAVABOROLE (ta va BO role) treats fungal infections of the nails. It belongs to a group of medications called antifungals. It will not treat infections caused by bacteria or viruses. This medicine may be used for other purposes; ask your health care provider or pharmacist if you have questions. COMMON BRAND NAME(S): KERYDIN What should I tell my care team before I take this medication? They need to know if you have any of these conditions: An unusual or allergic reaction to tavaborole, other medications, foods, dyes, or preservatives Pregnant or trying to get pregnant Breast-feeding How should I use this medication? This medication is for external use only. Do not take by mouth. Wash your hands before and after use. If you are treating your hands, only wash your hands before use. Do not get it in your eyes. If you do, rinse your eyes with plenty of cool tap water . Use it as directed on the prescription label. Do not use it more often than directed. Use the medication for the full course as directed by your care team, even if you think you are better. Do not stop using it unless your care team tells you to stop it early. Apply a thin film of the medication to the affected area. Talk to your care team about the use of this medication in children. While it may be prescribed for children as young as 6 years for selected conditions, precautions do apply. Overdosage: If you think you have taken too much of this medicine contact a poison control center or emergency room at once. NOTE: This medicine is only for you. Do not share this medicine with others. What if I miss a dose? If you miss a dose, use it as soon as you can. If it is almost time for your next dose, use only that dose. Do not use double or extra doses. What may interact with this medication? Interactions have not been studied. Do  not use any other nail products (i.e., nail polish, pedicures) during treatment with this medication. This list may not describe all possible interactions. Give your health care provider a list of all the medicines, herbs, non-prescription drugs, or dietary supplements you use. Also tell them if you smoke, drink alcohol, or use illegal drugs. Some items may interact with your medicine. What should I watch for while using this medication? Visit your care team for regular checks on your progress. It may be some time before you see the benefit from this medication. After bathing, make sure your skin is very dry. Fungal infections like moist conditions. Do not walk around barefoot. To help prevent reinfection, wear freshly washed cotton, not synthetic, clothing. Tell your care team if you develop sores or blisters that do not heal properly. If your skin infection returns after you stop using this medication, contact your care team. What side effects may I notice from receiving this medication? Side effects that you should report to your care team as soon as possible: Allergic reactions--skin rash, itching, hives, swelling of the face, lips, tongue, or throat Burning, itching, crusting, or peeling of treated skin Side effects that usually do not require medical attention (report to your care team if they continue or are bothersome): Ingrown nails Mild skin irritation, redness, or dryness This list may not describe all possible side effects. Call your doctor for medical advice about side effects. You may report  side effects to FDA at 1-800-FDA-1088. Where should I keep my medication? Keep out of the reach of children and pets. Store at room temperature between 20 and 25 degrees C (68 and 77 degrees F). Keep this medication in the original container. Protect from moisture. Keep the container tightly closed. Avoid exposure to extreme heat. Get rid of any unused medication 3 months after opening. This  medication is flammable. Avoid exposure to heat, fire, flame, and smoking. To get rid of medications that are no longer needed or have expired: Take the medications to a medication take-back program. Check with your pharmacy or law enforcement to find a location. If you cannot return the medication, check the label or package insert to see if the medication should be thrown out in the garbage or flushed down the toilet. If you are not sure, ask your care team. If it is safe to put it in the trash, empty the medication out of the container. Mix the medication with cat litter, dirt, coffee grounds, or other unwanted substance. Seal the mixture in a bag or container. Put it in the trash. NOTE: This sheet is a summary. It may not cover all possible information. If you have questions about this medicine, talk to your doctor, pharmacist, or health care provider.  2024 Elsevier/Gold Standard (2021-08-24 00:00:00)

## 2023-12-06 NOTE — Progress Notes (Signed)
 Subjective: Chief Complaint  Patient presents with   Nail Problem    Rm 11 Patient is here for a three month f/u post ingrown toe nail procedure of the left hallux. Left hallux is red and slightly swollen. Patient is requesting a nail trimming.    48 year old male presents after above concerns.  He thinks the nail fungus may be getting worse.  He has been using the topical Penlac  but not on a regular basis.  He has noticed some puffiness around the left big toenail but he has no pain with this and no drainage.   Objective: AAO x3, NAD DP/PT pulses palpable bilaterally, CRT less than 3 seconds To the hallux toenails in particular the nails remain hypertrophic, dystrophic with yellow, brown discoloration.  Nails initially looked more darker in color but this was from the buildup of the ciclopirox .  Once I trim and file the nail they were more clear in color but to remain hypertrophic, dystrophic.  He has no pain at the toenails.  There is no edema, erythema or signs of infection. No pain with calf compression, swelling, warmth, erythema  Assessment: Onychomycosis  Plan: -All treatment options discussed with the patient including all alternatives, risks, complications.  -As a courtesy history of debrided the nails without any complications or bleeding.  Will try switching to topical Kerydin but insurance allows.  Otherwise can continue the ciclopirox .  Unfortunately a lot of an oral medication given liver function. - Discussed urea  nail gel damage to the toenails.  Return in about 3 months (around 03/07/2024), or if symptoms worsen or fail to improve, for nail fungus.  Matthew [name] DPM

## 2023-12-09 ENCOUNTER — Telehealth: Payer: Self-pay | Admitting: Physician Assistant

## 2023-12-09 NOTE — Telephone Encounter (Signed)
 Patient is asking for an ESA letter.  Cat Male Matthew [name] 6-7 mo old. Will be spayed August 7th. Wt 5.6 lbs  He is asking if letter can be emailed to him. [email or web address]

## 2023-12-09 NOTE — Telephone Encounter (Signed)
 Pt lvm that he has gotten a cat. He needs an ESA letter for his apartment complex so he can keep the cat. Please call him at [phone]

## 2023-12-10 NOTE — Telephone Encounter (Signed)
 We can't email the letter. He will have to pick it up or have it mailed to him

## 2023-12-18 ENCOUNTER — Telehealth: Payer: Self-pay | Admitting: Cardiology

## 2023-12-18 ENCOUNTER — Other Ambulatory Visit: Payer: Self-pay

## 2023-12-18 MED ORDER — CLOPIDOGREL BISULFATE 75 MG PO TABS: 75.0000 mg | ORAL_TABLET | Freq: Every day | ORAL | 1 refills | Status: DC

## 2023-12-18 NOTE — Telephone Encounter (Signed)
°*  STAT* If patient is at the pharmacy, call can be transferred to refill team. ° ° °1. Which medications need to be refilled? (please list name of each medication and dose if known) clopidogrel (PLAVIX) 75 MG tablet ° °2. Which pharmacy/location (including street and city if local pharmacy) is medication to be sent to? [id number] - [place], [place] - [address] ° °3. Do they need a 30 day or 90 day supply? 90  °

## 2023-12-18 NOTE — Telephone Encounter (Signed)
Rx already sent to pharmacy.

## 2023-12-24 ENCOUNTER — Ambulatory Visit (HOSPITAL_BASED_OUTPATIENT_CLINIC_OR_DEPARTMENT_OTHER): Attending: Cardiology | Admitting: Cardiology

## 2023-12-24 DIAGNOSIS — G4736 Sleep related hypoventilation in conditions classified elsewhere: Secondary | ICD-10-CM | POA: Diagnosis not present

## 2023-12-24 DIAGNOSIS — G4733 Obstructive sleep apnea (adult) (pediatric): Secondary | ICD-10-CM

## 2023-12-25 NOTE — Procedures (Signed)
 Indications for Polysomnography The patient is a 48 year old Male who is 6' 3 and weighs 265.0 lbs.  His BMI equals 33.3.  A diagnostic polysomnogram was performed to evaluate for Obstructive Sleep Apnea.  After 150.5 minutes of sleep time the patient exhibited sufficient respiratory  events qualifying him for a CPAP trial which was then initiated.  No medications were reported taken during the night.No Data. Polysomnogram Data A full night polysomnogram was performed recording the standard physiologic parameters including EEG, EOG, EMG, EKG, nasal and oral airflow.  Respiratory parameters of chest and abdominal movements are recorded with Piezo-Crystal motion transducers.   Oxygen saturation was recorded by pulse oximetry.  Sleep Architecture The total recording time of the diagnostic portion of the study was 173.8 minutes.  The total sleep time was 150.5 minutes.  During the diagnostic portion of the study, the patient spent 13.3% of total sleep time in Stage N1, 69.8% in Stage N2, 0.0% in  Stages N3, and 16.9% in REM.   Sleep latency was 9.2 minutes.  REM latency was 134.0 minutes.  Sleep Efficiency was 86.6%.  Wake after Sleep Onset time was 14.0 minutes.  At 12:08:12 AM the patient was placed on PAP treatment and was titrated at pressures ranging from 6 cm/H20 up to 10 cm/H20. The total recording time of the treatment portion of the study was 264.5 minutes.  The total sleep time was 232.5 minutes.  During  the treatment portion of the study, the patient spent 8.0% of total sleep time in Stage N1, 68.8% in Stage N2, 0.0% in Stages N3, and 23.2% in REM.   Sleep latency was 7.5 minutes.  REM latency was 90.0 minutes.  Sleep Efficiency was 87.9%.  Wake after  Sleep Onset time was 24.0 minutes.  Respiratory Events During the diagnostic portion of the study, the polysomnogram revealed a presence of 0 obstructive, 0 central, and  mixed apneas resulting in an Apnea index of 0 events per hour.   There were 54 hypopneas (GreaterEqual to3% desaturation and/or arousal)  resulting in an Apnea\Hypopnea Index (AHI GreaterEqual to3% desaturation and/or arousal) of 21.5 events per hour.  There were 49 hypopneas (GreaterEqual to4% desaturation) resulting in an Apnea\Hypopnea Index (AHI GreaterEqual to4% desaturation) of 19.5  events per hour.  There were 8 Respiratory Effort Related Arousals resulting in a RERA index of 3.2 events per hour. The Respiratory Disturbance Index is 24.7 events per hour.  The snore index was 0 events per hour.  Mean oxygen saturation was 91.3%.   The lowest oxygen saturation during sleep was 80.0%.  Time spent LessEqual to88% oxygen saturation was  minutes ().  During the treatment portion of the study, the polysomnogram revealed a presence of 0 obstructive, 1 central, and 0 mixed apneas resulting in an Apnea index of 0.3 events per hour.  There were 8 hypopneas (GreaterEqual to3% desaturation and/or arousal)  resulting in an Apnea\Hypopnea Index (AHI GreaterEqual to3% desaturation and/or arousal) of 2.3 events per hour.  There were 7 hypopneas (GreaterEqual to4% desaturation) resulting in an Apnea\Hypopnea Index (AHI GreaterEqual to4% desaturation) of 2.1  events per hour.  There were 10 Respiratory Effort Related Arousals resulting in a RERA index of 2.6 events per hour. The Respiratory Disturbance Index is 4.9 events per hour.  The snore index was 0 events per hour.  Mean oxygen saturation was 93.3%.   The lowest oxygen saturation during sleep was 89.0%.  Time spent LessEqual to88% oxygen saturation was  minutes.  Limb Activity During the  diagnostic portion of the study, there were 0 limb movements recorded    Cardiac Summary During the diagnostic portion of the study, the average pulse rate was 79.6 bpm.  The minimum pulse rate was 58.0 bpm while the maximum pulse rate was 99.0 bpm.  There were rare PVCs.  During the treatment portion of the study, the average pulse  rate was 78.3 bpm.  The minimum pulse rate was 62.0 bpm while the maximum pulse rate was 110.0 bpm. There were rare PVCs.  Diagnosis: Moderate Obstructive Sleep Apnea Nocturnal Hypoxemia  Recommendations: 1. Recommend a trial of ResMed CPAP at 10cm H2O with EPR 2 and medium ResMed AirFit F30 mask and heated humidity. 2.  Close follow-up is necessary to ensure success with CPAP therapy for maximum benefit. 3. A follow-up oximetry study on CPAP is recommended to assess the adequacy of therapy and determine the need for supplemental oxygen or the potential need for Bi-level therapy.  An arterial blood gas to determine the adequacy of baseline ventilation and  oxygenation should also be considered. 4.  Healthy sleep recommendations include:  adequate nightly sleep (normal 7-9 hrs/night), avoidance of caffeine after noon and alcohol near bedtime, and maintaining a sleep environment that is cool, dark and quiet. 5.  Weight loss for overweight patients is recommended.  Even modest amounts of weight loss can significantly improve the severity of sleep apnea. 6.  Snoring recommendations include:  weight loss where appropriate, side sleeping, and avoidance of alcohol before bed. 7. Operation of motor vehicle should be avoided when sleepy.    This study was personally reviewed and electronically signed by: Dr. Wilbert Bihari Accredited Board Certified in Sleep Medicine Date/Time: 12/25/2023 8:34AM

## 2023-12-26 ENCOUNTER — Telehealth: Payer: Self-pay | Admitting: *Deleted

## 2023-12-26 DIAGNOSIS — G4733 Obstructive sleep apnea (adult) (pediatric): Secondary | ICD-10-CM

## 2023-12-26 NOTE — Telephone Encounter (Signed)
 The patient has been notified of the result and verbalized understanding.  All questions (if any) were answered. Matthew [name], CMA 12/26/2023 4:42 PM    Patient prefers the referral to Dr. Carlie for [place].

## 2023-12-26 NOTE — Telephone Encounter (Signed)
-----   Message from Matthew [name] sent at 12/25/2023  8:41 AM EDT ----- Patient does qualify for the INspire device so please find out if he wants to proceed with CPAP or referral to Dr. Carlie for [facility]

## 2023-12-26 NOTE — Telephone Encounter (Signed)
 Patient has declined the cpap machine and wants the referral to Dr. Carlie for [facility].

## 2023-12-26 NOTE — Telephone Encounter (Signed)
-----   Message from Wilbert Bihari sent at 12/25/2023  8:36 AM EDT ----- Please let patient know that they have sleep apnea with successful PAP titration.  PAP ordered placed in Epic.  Followup in 6 weeks after starting PAP therapy.  Get ONO on CPAP

## 2024-01-03 NOTE — Telephone Encounter (Signed)
 Referral placed for Dr. Carlie for work up for [facility] device, patient notified via [facility].

## 2024-01-03 NOTE — Addendum Note (Signed)
 Addended by: JANIT GENI CROME on: 01/03/2024 11:48 AM   Modules accepted: Orders

## 2024-01-16 ENCOUNTER — Telehealth: Payer: Self-pay | Admitting: Cardiology

## 2024-01-16 ENCOUNTER — Encounter: Payer: Self-pay | Admitting: Physician Assistant

## 2024-01-16 ENCOUNTER — Ambulatory Visit (INDEPENDENT_AMBULATORY_CARE_PROVIDER_SITE_OTHER): Admitting: Physician Assistant

## 2024-01-16 DIAGNOSIS — F5105 Insomnia due to other mental disorder: Secondary | ICD-10-CM

## 2024-01-16 DIAGNOSIS — F902 Attention-deficit hyperactivity disorder, combined type: Secondary | ICD-10-CM | POA: Diagnosis not present

## 2024-01-16 DIAGNOSIS — G2401 Drug induced subacute dyskinesia: Secondary | ICD-10-CM | POA: Diagnosis not present

## 2024-01-16 DIAGNOSIS — F250 Schizoaffective disorder, bipolar type: Secondary | ICD-10-CM | POA: Diagnosis not present

## 2024-01-16 DIAGNOSIS — F4321 Adjustment disorder with depressed mood: Secondary | ICD-10-CM

## 2024-01-16 DIAGNOSIS — F411 Generalized anxiety disorder: Secondary | ICD-10-CM

## 2024-01-16 DIAGNOSIS — F99 Mental disorder, not otherwise specified: Secondary | ICD-10-CM

## 2024-01-16 MED ORDER — ESZOPICLONE 2 MG PO TABS
2.0000 mg | ORAL_TABLET | Freq: Every evening | ORAL | 0 refills | Status: DC | PRN
Start: 2024-01-16 — End: 2024-03-02

## 2024-01-16 NOTE — Telephone Encounter (Signed)
 Patient stated his ENT's office has not received Dr. Dorine referral and want the referral re-sent.

## 2024-01-16 NOTE — Progress Notes (Signed)
 [facility]  Patient ID: Matthew [name],  MRN: [id number]  PCP: Jhon Elveria LABOR, MD  Date of Evaluation: 01/16/2024 Time spent:25 minutes  Chief Complaint:  Chief Complaint   Depression; ADD; Follow-up    HISTORY/CURRENT STATUS: HPI For routine med check.   His sister has AML, stem cell transplant was able to be done twice b/c infections.  There's nothing that can be done. The dr has given her a few weeks to a few months.  That's caused a lot of sadness, his mom died last year so this makes the grief even harder. He's not sleeping.  Might get a few hours and doesn't feel rested.  ADLs and personal hygiene are normal.  Appetite has not changed.  Weight is stable.   Not anxious, at least very often.  No SI/HI.  States that attention is good without easy distractibility.  Able to focus on things and finish tasks to completion.   Patient denies increased energy with decreased need for sleep, increased talkativeness, racing thoughts, impulsivity or risky behaviors, increased spending, increased libido, grandiosity, increased irritability or anger, paranoia, or hallucinations.  Denies dizziness, syncope, seizures, numbness, tingling, tremor, tics, unsteady gait, slurred speech, confusion. Denies muscle or joint pain, stiffness, or dystonia. Denies unexplained weight loss, frequent infections, or sores that heal slowly.  No polyphagia, polydipsia, or polyuria. Denies visual changes or paresthesias.   Individual Medical History/ Review of Systems: Changes? :Yes    sleep study again about 3 weeks ago. Showed OSA again, CPAP hard to tolerate, is planning to have inspire consult.   Past medications for mental health diagnoses include: Risperdal, Zyprexa , Abilify , [name] , Klonopin , Gabapentin , Xanax , Valium, Ativan , Hydroxyzine , Trilafon , Atenolol , Cymbalta , Prozac, Lexapro, Paxil, Lithium , Adderall, Ritalin, Mirtazepine, Sonata  was ineffective.  Austedo  helped some,  Strattera , Quelbree caused headaches.  Ingrezza   Allergies: Gluten meal and Lactose intolerance (gi)  Current Medications:  Current Outpatient Medications:    ARIPiprazole  (ABILIFY ) 20 MG tablet, Take 1 tablet (20 mg total) by mouth daily., Disp: , Rfl:    aspirin  EC 81 MG tablet, Take 1 tablet (81 mg total) by mouth daily. Swallow whole., Disp: 30 tablet, Rfl: 0   atomoxetine  (STRATTERA ) 100 MG capsule, TAKE 1 CAPSULE BY MOUTH EVERY DAY, Disp: 90 capsule, Rfl: 1   atorvastatin  (LIPITOR ) 80 MG tablet, Take 1 tablet (80 mg total) by mouth daily., Disp: 90 tablet, Rfl: 3   Bempedoic Acid-Ezetimibe  (NEXLIZET ) 180-10 MG TABS, Take 1 tablet by mouth daily., Disp: 90 tablet, Rfl: 3   carvedilol  (COREG ) 12.5 MG tablet, Take 3 tablets (37.5 mg total) by mouth 2 (two) times daily., Disp: 540 tablet, Rfl: 3   ciclopirox  (PENLAC ) 8 % solution, Apply topically at bedtime. Apply over nail and surrounding skin. Apply daily over previous coat. After seven (7) days, may remove with alcohol and continue cycle., Disp: 6.6 mL, Rfl: 2   clopidogrel  (PLAVIX ) 75 MG tablet, Take 1 tablet (75 mg total) by mouth daily., Disp: 90 tablet, Rfl: 1   DULoxetine  (CYMBALTA ) 60 MG capsule, Take 2 capsules (120 mg total) by mouth daily., Disp: 180 capsule, Rfl: 1   empagliflozin  (JARDIANCE ) 25 MG TABS tablet, Take 1 tablet (25 mg total) by mouth every morning., Disp: 90 tablet, Rfl: 3   eszopiclone  (LUNESTA ) 2 MG TABS tablet, Take 1 tablet (2 mg total) by mouth at bedtime as needed for sleep. Take immediately before bedtime, Disp: 30 tablet, Rfl: 0   fenofibrate  (TRICOR ) 145 MG tablet, Take 1 tablet (145  mg total) by mouth daily., Disp: 90 tablet, Rfl: 3   gabapentin  (NEURONTIN ) 800 MG tablet, TAKE 1 TABLET BY MOUTH 4 TIMES A DAY WITH AN EXTRA 1/2 TABLET DURING THE DAY AS NEEDED, Disp: 135 tablet, Rfl: 5   hydrOXYzine  (ATARAX ) 50 MG tablet, TAKE 1 TABLET BY MOUTH EVERY 6 HOURS AS NEEDED, Disp: 360 tablet, Rfl: 0   icosapent  Ethyl  (VASCEPA ) 1 g capsule, Take 2 capsules (2 g total) by mouth 2 (two) times daily., Disp: 360 capsule, Rfl: 3   insulin  glargine (LANTUS  SOLOSTAR) 100 UNIT/ML Solostar Pen, Inject 20 Units into the skin daily., Disp: 15 mL, Rfl: 4   Insulin  Pen Needle 32G X 4 MM MISC, 1 Device by Does not apply route daily in the afternoon., Disp: 100 each, Rfl: 3   metFORMIN  (GLUCOPHAGE -XR) 750 MG 24 hr tablet, Take 2 tablets (1,500 mg total) by mouth daily with breakfast., Disp: 180 tablet, Rfl: 3   Semaglutide , 2 MG/DOSE, (OZEMPIC , 2 MG/DOSE,) 8 MG/3ML SOPN, INJECT 2MG  DOSE INTO THE SKIN ONE TIME PER WEEK, Disp: 9 mL, Rfl: 0   Tavaborole  (KERYDIN ) 5 % SOLN, Apply 1 drop topically daily. Apply 1 drop to the toenail daily., Disp: 10 mL, Rfl: 2   valbenazine  (INGREZZA ) 80 MG capsule, Take 1 capsule (80 mg total) by mouth daily., Disp: 30 capsule, Rfl: 5 Medication Side Effects: none  Family Medical/ Social History: Changes?  29 yo Sister dx w/ leukemia   MENTAL HEALTH EXAM:  There were no vitals taken for this visit.There is no height or weight on file to calculate BMI.  General Appearance: Casual and Well Groomed  Eye Contact:  Good  Speech:  Clear and Coherent and Normal Rate  Volume:  Normal  Mood:  sad  Affect:  Congruent  Thought Process:  Goal Directed and Descriptions of Associations: Circumstantial  Orientation:  Full (Time, Place, and Person)  Thought Content: Logical   Suicidal Thoughts:  No  Homicidal Thoughts:  No  Memory:  WNL  Judgement:  Fair  Insight:  Good  Psychomotor Activity:  Normal  Concentration:  Concentration: Good and Attention Span: Fair  Recall:  Good  Fund of Knowledge: Good  Language: Good  Assets:  Communication Skills Desire for Improvement Financial Resources/Insurance Housing Transportation  ADL's:  Intact  Cognition: WNL  Prognosis:  Good         AIMS    Flowsheet Row Office Visit from 06/18/2023 in [facility] Office Visit from  03/18/2023 in [facility] Office Visit from 02/18/2023 in [facility] Office Visit from 05/30/2022 in [facility] Office Visit from 04/20/2022 in [facility]  AIMS Total Score 1 2 8 3 7    PHQ2-9    Flowsheet Row CARDIAC REHAB PHASE II ORIENTATION from 08/16/2020 in [facility]  PHQ-2 Total Score 0   Flowsheet Row ED from 09/02/2023 in [facility] Emergency Department at [facility] ED to Hosp-Admission (Discharged) from 06/30/2022 in [place] [facility] ED to Hosp-Admission (Discharged) from 06/01/2022 in [facility] 73M KIDNEY UNIT  C-SSRS RISK CATEGORY No Risk No Risk No Risk   PCP and cardiology treat abnormal labs.  DIAGNOSES:    ICD-10-CM   1. Schizoaffective disorder, bipolar type (HCC)  F25.0     2. Generalized anxiety disorder  F41.1     3. Attention  deficit hyperactivity disorder (ADHD), combined type, moderate  F90.2     4. Tardive dyskinesia  G24.01     5. Grief  F43.21     6. Insomnia due to other mental disorder  F51.05    F99      Receiving Psychotherapy: No   RECOMMENDATIONS:  PDMP reviewed.  Gabapentin  filled 01/08/2024. I provided approximately 25 minutes of face to face time during this encounter, including time spent before and after the visit in records review, medical decision making, counseling pertinent to today's visit, and charting.   I'm sorry to hear about his sister.  Sleep hygiene discussed, counseled from approx 17 minutes. Recommend starting a sedative esp d/t stress w/ his sister's illness.   Continue Abilify  20 mg, 1 p.o. daily. Continue Strattera  100 mg, 1 p.o. daily. Continue Cymbalta  60 mg, 2 p.o. daily. Start Lunesta  2 mg 1 po at bedtime prn. Continue gabapentin  800 mg 1 p.o. 4 times daily with 1/2 pill mid day.  Continue hydroxyzine  50 mg, 1  p.o. 4 times daily as needed.   Continue Ingrezza  80 mg daily.  Strongly recommend counseling. Return in 6-8 weeks  Verneita Cooks, PA-C

## 2024-01-18 ENCOUNTER — Other Ambulatory Visit: Payer: Self-pay | Admitting: Physician Assistant

## 2024-01-21 NOTE — Telephone Encounter (Signed)
 Call to [facility] at Dr. Ilona office to follow up on referral placed 01/03/24. Left VM requesting update on status of referral. MC message to patient.

## 2024-01-24 ENCOUNTER — Telehealth: Payer: Self-pay | Admitting: Physician Assistant

## 2024-01-24 ENCOUNTER — Telehealth: Payer: Self-pay | Admitting: *Deleted

## 2024-01-24 ENCOUNTER — Other Ambulatory Visit: Payer: Self-pay | Admitting: Physician Assistant

## 2024-01-24 ENCOUNTER — Telehealth: Payer: Self-pay

## 2024-01-24 MED ORDER — BUSPIRONE HCL 15 MG PO TABS: ORAL_TABLET | ORAL | 1 refills | Status: DC

## 2024-01-24 NOTE — Telephone Encounter (Signed)
 Next visit is 03/02/24. Matthew [name]' sister is dying and he would like to have something for his anxiety during this time. Pharmacy is:  [id number] - [place], [place] - [address] AT [facility] [address] [address] [address]   Phone: [phone]  Fax: [phone]

## 2024-01-24 NOTE — Telephone Encounter (Signed)
 Med rec and consent done.    Patient Consent for Virtual Visit        Matthew [name] has provided verbal consent on 01/24/2024 for a virtual visit (video or telephone).   CONSENT FOR VIRTUAL VISIT FOR:  Matthew [name]  By participating in this virtual visit I agree to the following:  I hereby voluntarily request, consent and authorize [facility] and its employed or contracted physicians, physician assistants, nurse practitioners or other licensed health care professionals (the Practitioner), to provide me with telemedicine health care services (the "Services) as deemed necessary by the treating Practitioner. I acknowledge and consent to receive the Services by the Practitioner via telemedicine. I understand that the telemedicine visit will involve communicating with the Practitioner through live audiovisual communication technology and the disclosure of certain medical information by electronic transmission. I acknowledge that I have been given the opportunity to request an in-person assessment or other available alternative prior to the telemedicine visit and am voluntarily participating in the telemedicine visit.  I understand that I have the right to withhold or withdraw my consent to the use of telemedicine in the course of my care at any time, without affecting my right to future care or treatment, and that the Practitioner or I may terminate the telemedicine visit at any time. I understand that I have the right to inspect all information obtained and/or recorded in the course of the telemedicine visit and may receive copies of available information for a reasonable fee.  I understand that some of the potential risks of receiving the Services via telemedicine include:  Delay or interruption in medical evaluation due to technological equipment failure or disruption; Information transmitted may not be sufficient (e.g. poor resolution of images) to allow for appropriate medical  decision making by the Practitioner; and/or  In rare instances, security protocols could fail, causing a breach of personal health information.  Furthermore, I acknowledge that it is my responsibility to provide information about my medical history, conditions and care that is complete and accurate to the best of my ability. I acknowledge that Practitioner's advice, recommendations, and/or decision may be based on factors not within their control, such as incomplete or inaccurate data provided by me or distortions of diagnostic images or specimens that may result from electronic transmissions. I understand that the practice of medicine is not an exact science and that Practitioner makes no warranties or guarantees regarding treatment outcomes. I acknowledge that a copy of this consent can be made available to me via my patient portal [facility] MyChart), or I can request a printed copy by calling the office of [facility].    I understand that my insurance will be billed for this visit.   I have read or had this consent read to me. I understand the contents of this consent, which adequately explains the benefits and risks of the Services being provided via telemedicine.  I have been provided ample opportunity to ask questions regarding this consent and the Services and have had my questions answered to my satisfaction. I give my informed consent for the services to be provided through the use of telemedicine in my medical care

## 2024-01-24 NOTE — Telephone Encounter (Signed)
   Pre-operative Risk Assessment    Patient Name: Matthew [name]  DOB: 01-12-1976 MRN: [id number]   Date of last office visit: 10/04/23 JACKEE ALBERTS, NP Date of next office visit: NONE   Request for Surgical Clearance    Procedure:  DRUG INDUCED SLEEP ENDOSCOPY  Date of Surgery:  Clearance 01/28/24                                Surgeon:  DR. CARLIE Surgeon's Group or Practice Name:  [facility] Phone number:  [phone] Fax number:  [phone]   Type of Clearance Requested:   - Medical  - Pharmacy:  Hold Aspirin  and Clopidogrel  (Plavix )     Type of Anesthesia:  General    Additional requests/questions:    Bonney Niels Jest   01/24/2024, 2:53 PM

## 2024-01-24 NOTE — Telephone Encounter (Signed)
 Spoke with patient about scheduling. While talking with patient stated that the procedure was 02/06/24 not 01/28/24.  I told patient that we can schedule a TELE preop appt as if the procedure is 02/06/24 and I would call the surgeons office to confirm that date. Pt voiced understanding.  Pt is scheduled for TELE preop appt  on 01/30/24. Med rec and consent done.   Once pt was scheduled I called [facility] and talked to [facility] and she confirmed that the patient appt is on 02/06/24. Informed her that once the TELE was completed on 01/30/24 our office will send over the paper work for the preop clearance.  I called the patient back and informed him that I was able to confirm the procedure date and the TELE preop app on 01/30/24 can be kept.

## 2024-01-24 NOTE — Telephone Encounter (Signed)
 I sent in [place] 

## 2024-01-24 NOTE — Telephone Encounter (Signed)
   Name: Matthew [name]  DOB: August 05, 1975  MRN: [id number]  Primary Cardiologist: Wilbert Bihari, MD   Preoperative team, please contact this Matthew [name] and set up a phone call appointment for further preoperative risk assessment. Please obtain consent and complete medication review. Thank you for your help.  I confirm that guidance regarding antiplatelet and oral anticoagulation therapy has been completed and, if necessary, noted below.  Per office protocol, if Matthew [name] is without any new symptoms or concerns at the time of their virtual visit, Matthew [name] may hold Plavix  for 5 days prior to procedure. Please resume Plavix  as soon as possible postprocedure, at the discretion of the surgeon.    I also confirmed the Matthew [name] resides in the state of [place] . As per [facility] Medical Board telemedicine laws, the Matthew [name] must reside in the state in which the provider is licensed.   Lamarr Satterfield, NP 01/24/2024, 2:57 PM [facility]

## 2024-01-24 NOTE — Telephone Encounter (Signed)
 Please see message from patient. He is aware that you will not prescribe benzos for him.   [facility] [address]

## 2024-01-27 ENCOUNTER — Encounter: Payer: Self-pay | Admitting: Dermatology

## 2024-01-27 ENCOUNTER — Other Ambulatory Visit: Payer: Self-pay | Admitting: Otolaryngology

## 2024-01-27 ENCOUNTER — Ambulatory Visit (INDEPENDENT_AMBULATORY_CARE_PROVIDER_SITE_OTHER): Admitting: Dermatology

## 2024-01-27 VITALS — BP 200/46 | HR 88

## 2024-01-27 DIAGNOSIS — D229 Melanocytic nevi, unspecified: Secondary | ICD-10-CM

## 2024-01-27 DIAGNOSIS — D239 Other benign neoplasm of skin, unspecified: Secondary | ICD-10-CM

## 2024-01-27 DIAGNOSIS — D225 Melanocytic nevi of trunk: Secondary | ICD-10-CM | POA: Diagnosis not present

## 2024-01-27 DIAGNOSIS — L700 Acne vulgaris: Secondary | ICD-10-CM | POA: Diagnosis not present

## 2024-01-27 NOTE — Progress Notes (Signed)
   New Patient Visit   Subjective  Matthew [name] is a 48 y.o. male who presents for the following: Spots of concern. Lesion on left upper arm, medial side, present for several months, within 3+ year old tattoo. Not painful or itchy, not previously treated.   He also mentions that his PCP has some concerns about some lesions on his back. He is not sure if which lesions are of concern.   No hx or family hx of skin cancer.  The following portions of the chart were reviewed this encounter and updated as appropriate: medications, allergies, medical history  Review of Systems:  No other skin or systemic complaints except as noted in HPI or Assessment and Plan.  Objective  Well appearing patient in no apparent distress; mood and affect are within normal limits.   A focused examination was performed of the following areas: Left arm and back  Relevant exam findings are noted in the Assessment and Plan.    Assessment & Plan   Open Comedone with Keratin- Left medial upper arm - Reassured on benign nature - OK to monitor  Atypical Nevi- Back - Photo taken, will monitor with time  COMEDONE Left Upper Arm - Anterior Observation. DYSPLASTIC NEVUS (2) Left Upper Back, Right Upper Back Observation, return in 4-6 months for recheck  Return in 4 months (on 05/28/2024) for return for skin check 4-6 .  I, Berwyn Lesches, Surg Tech III, am acting as scribe for RUFUS CHRISTELLA HOLY, MD.   Documentation: I have reviewed the above documentation for accuracy and completeness, and I agree with the above.  RUFUS CHRISTELLA HOLY, MD

## 2024-01-27 NOTE — Patient Instructions (Addendum)
 Important Information  Due to recent changes in healthcare laws, you may see results of your pathology and/or laboratory studies on MyChart before the doctors have had a chance to review them. We understand that in some cases there may be results that are confusing or concerning to you. Please understand that not all results are received at the same time and often the doctors may need to interpret multiple results in order to provide you with the best plan of care or course of treatment. Therefore, we ask that you please give us  2 business days to thoroughly review all your results before contacting the office for clarification. Should we see a critical lab result, you will be contacted sooner.   If You Need Anything After Your Visit  If you have any questions or concerns for your doctor, please call our main line at [phone] If no one answers, please leave a voicemail as directed and we will return your call as soon as possible. Messages left after 4 pm will be answered the following business day.   You may also send us  a message via MyChart. We typically respond to MyChart messages within 1-2 business days.  For prescription refills, please ask your pharmacy to contact our office. Our fax number is [phone].  If you have an urgent issue when the clinic is closed that cannot wait until the next business day, you can page your doctor at the number below.    Please note that while we do our best to be available for urgent issues outside of office hours, we are not available 24/7.   If you have an urgent issue and are unable to reach us , you may choose to seek medical care at your doctor's office, retail clinic, urgent care center, or emergency room.  If you have a medical emergency, please immediately call 911 or go to the emergency department. In the event of inclement weather, please call our main line at [phone] for an update on the status of any delays or  closures.  Dermatology Medication Tips: Please keep the boxes that topical medications come in in order to help keep track of the instructions about where and how to use these. Pharmacies typically print the medication instructions only on the boxes and not directly on the medication tubes.   If your medication is too expensive, please contact our office at [phone] or send us  a message through MyChart.   We are unable to tell what your co-pay for medications will be in advance as this is different depending on your insurance coverage. However, we may be able to find a substitute medication at lower cost or fill out paperwork to get insurance to cover a needed medication.   If a prior authorization is required to get your medication covered by your insurance company, please allow us  1-2 business days to complete this process.  Drug prices often vary depending on where the prescription is filled and some pharmacies may offer cheaper prices.  The website www.goodrx.com contains coupons for medications through different pharmacies. The prices here do not account for what the cost may be with help from insurance (it may be cheaper with your insurance), but the website can give you the price if you did not use any insurance.  - You can print the associated coupon and take it with your prescription to the pharmacy.  - You may also stop by our office during regular business hours and pick up a GoodRx coupon card.  - If  you need your prescription sent electronically to a different pharmacy, notify our office through [facility] or by phone at [phone]    Skin Education :   I counseled the patient regarding the following: Sun screen (SPF 30 or greater) should be applied during peak UV exposure (between 10am and 2pm) and reapplied after exercise or swimming.  The ABCDEs of melanoma were reviewed with the patient, and the importance of monthly self-examination of moles was emphasized.  Should any moles change in shape or color, or itch, bleed or burn, pt will contact our office for evaluation sooner then their interval appointment.  Plan: Sunscreen Recommendations I recommended a broad spectrum sunscreen with a SPF of 30 or higher. I explained that SPF 30 sunscreens block approximately 97 percent of the sun's harmful rays. Sunscreens should be applied at least 15 minutes prior to expected sun exposure and then every 2 hours after that as long as sun exposure continues. If swimming or exercising sunscreen should be reapplied every 45 minutes to an hour after getting wet or sweating. One ounce, or the equivalent of a shot glass full of sunscreen, is adequate to protect the skin not covered by a bathing suit. I also recommended a lip balm with a sunscreen as well. Sun protective clothing can be used in lieu of sunscreen but must be worn the entire time you are exposed to the sun's rays.  Important Information  Due to recent changes in healthcare laws, you may see results of your pathology and/or laboratory studies on MyChart before the doctors have had a chance to review them. We understand that in some cases there may be results that are confusing or concerning to you. Please understand that not all results are received at the same time and often the doctors may need to interpret multiple results in order to provide you with the best plan of care or course of treatment. Therefore, we ask that you please give us  2 business days to thoroughly review all your results before contacting the office for clarification. Should we see a critical lab result, you will be contacted sooner.   If You Need Anything After Your Visit  If you have any questions or concerns for your doctor, please call our main line at [phone] If no one answers, please leave a voicemail as directed and we will return your call as soon as possible. Messages left after 4 pm will be answered the following business day.    You may also send us  a message via MyChart. We typically respond to MyChart messages within 1-2 business days.  For prescription refills, please ask your pharmacy to contact our office. Our fax number is [phone].  If you have an urgent issue when the clinic is closed that cannot wait until the next business day, you can page your doctor at the number below.    Please note that while we do our best to be available for urgent issues outside of office hours, we are not available 24/7.   If you have an urgent issue and are unable to reach us , you may choose to seek medical care at your doctor's office, retail clinic, urgent care center, or emergency room.  If you have a medical emergency, please immediately call 911 or go to the emergency department. In the event of inclement weather, please call our main line at [phone] for an update on the status of any delays or closures.  Dermatology Medication Tips: Please keep the boxes that  topical medications come in in order to help keep track of the instructions about where and how to use these. Pharmacies typically print the medication instructions only on the boxes and not directly on the medication tubes.   If your medication is too expensive, please contact our office at [phone] or send us  a message through MyChart.   We are unable to tell what your co-pay for medications will be in advance as this is different depending on your insurance coverage. However, we may be able to find a substitute medication at lower cost or fill out paperwork to get insurance to cover a needed medication.   If a prior authorization is required to get your medication covered by your insurance company, please allow us  1-2 business days to complete this process.  Drug prices often vary depending on where the prescription is filled and some pharmacies may offer cheaper prices.  The website www.goodrx.com contains coupons for medications through different  pharmacies. The prices here do not account for what the cost may be with help from insurance (it may be cheaper with your insurance), but the website can give you the price if you did not use any insurance.  - You can print the associated coupon and take it with your prescription to the pharmacy.  - You may also stop by our office during regular business hours and pick up a GoodRx coupon card.  - If you need your prescription sent electronically to a different pharmacy, notify our office through [facility] or by phone at [phone]    Skin Education :   I counseled the patient regarding the following: Sun screen (SPF 30 or greater) should be applied during peak UV exposure (between 10am and 2pm) and reapplied after exercise or swimming.  The ABCDEs of melanoma were reviewed with the patient, and the importance of monthly self-examination of moles was emphasized. Should any moles change in shape or color, or itch, bleed or burn, pt will contact our office for evaluation sooner then their interval appointment.  Plan: Sunscreen Recommendations I recommended a broad spectrum sunscreen with a SPF of 30 or higher. I explained that SPF 30 sunscreens block approximately 97 percent of the sun's harmful rays. Sunscreens should be applied at least 15 minutes prior to expected sun exposure and then every 2 hours after that as long as sun exposure continues. If swimming or exercising sunscreen should be reapplied every 45 minutes to an hour after getting wet or sweating. One ounce, or the equivalent of a shot glass full of sunscreen, is adequate to protect the skin not covered by a bathing suit. I also recommended a lip balm with a sunscreen as well. Sun protective clothing can be used in lieu of sunscreen but must be worn the entire time you are exposed to the sun's rays.  Patient Handout: Wound Care for Skin Biopsy Site  Taking Care of Your Skin Biopsy Site  Proper care of the biopsy site is  essential for promoting healing and minimizing scarring. This handout provides instructions on how to care for your biopsy site to ensure optimal recovery.  1. Cleaning the Wound:  Clean the biopsy site daily with gentle soap and water . Gently pat the area dry with a clean, soft towel. Avoid harsh scrubbing or rubbing the area, as this can irritate the skin and delay healing.  2. Applying Aquaphor and Bandage:  After cleaning the wound, apply a thin layer of Aquaphor ointment to the biopsy site. Cover the  area with a sterile bandage to protect it from dirt, bacteria, and friction. Change the bandage daily or as needed if it becomes soiled or wet.  3. Continued Care for One Week:  Repeat the cleaning, Aquaphor application, and bandaging process daily for one week following the biopsy procedure. Keeping the wound clean and moist during this initial healing period will help prevent infection and promote optimal healing.  4. Massaging Aquaphor into the Area:  ---After one week, discontinue the use of bandages but continue to apply Aquaphor to the biopsy site. ----Gently massage the Aquaphor into the area using circular motions. ---Massaging the skin helps to promote circulation and prevent the formation of scar tissue.   Additional Tips:  Avoid exposing the biopsy site to direct sunlight during the healing process, as this can cause hyperpigmentation or worsen scarring. If you experience any signs of infection, such as increased redness, swelling, warmth, or drainage from the wound, contact your healthcare provider immediately. Follow any additional instructions provided by your healthcare provider for caring for the biopsy site and managing any discomfort. Conclusion:  Taking proper care of your skin biopsy site is crucial for ensuring optimal healing and minimizing scarring. By following these instructions for cleaning, applying Aquaphor, and massaging the area, you can promote a  smooth and successful recovery. If you have any questions or concerns about caring for your biopsy site, don't hesitate to contact your healthcare provider for guidance.

## 2024-01-30 ENCOUNTER — Ambulatory Visit (INDEPENDENT_AMBULATORY_CARE_PROVIDER_SITE_OTHER)

## 2024-01-30 DIAGNOSIS — Z01810 Encounter for preprocedural cardiovascular examination: Secondary | ICD-10-CM | POA: Diagnosis not present

## 2024-01-30 NOTE — Progress Notes (Signed)
 Virtual Visit via Telephone Note   Because of Matthew [name] co-morbid illnesses, he is at least at moderate risk for complications without adequate follow up.  This format is felt to be most appropriate for this patient at this time.  Due to technical limitations with video connection Web designer), today's appointment will be conducted as an audio only telehealth visit, and Matthew [name] verbally agreed to proceed in this manner.   All issues noted in this document were discussed and addressed.  No physical exam could be performed with this format.  Evaluation Performed:  Preoperative cardiovascular risk assessment _____________   Date:  01/31/2024   Patient ID:  Matthew [name], DOB 1975-07-20, MRN [id number] Patient Location:  Home Provider location:   Office  Primary Care Provider:  Jhon Elveria LABOR, Matthew [name] Primary Cardiologist:  Matthew Bihari, Matthew [name]  Chief Complaint / Patient Profile   48 y.o. y/o male with a h/o CAD status post NSTEMI in 2022 with DES to proximal LAD and DES to RCA, HTN, HLD, prediabetes, celiac disease, schizoaffective disorder with anxiety, ADHD, OSA not on CPAP who is pending drug-induced sleep endoscopy and presents today for telephonic preoperative cardiovascular risk assessment.  History of Present Illness    Matthew [name] is a 48 y.o. male who presents via audio/video conferencing for a telehealth visit today.  Pt was last seen in cardiology clinic on 10/04/2023 by Jackee Alberts, Matthew [name].  At that time Matthew [name] was doing well .  The patient is now pending procedure as outlined above. Since his last visit, he has been doing pretty good. No cp or sob. Recent shoulder replacement so activity is limited somewhat because of that. He does go to the gym about 5 days a week.  Per office protocol, if patient is without any new symptoms or concerns at the time of their virtual visit, he may hold Plavix  for 5 days prior to procedure. Please resume Plavix  as soon as  possible postprocedure, at the discretion of the surgeon.   Past Medical History    Past Medical History:  Diagnosis Date   Anxiety    Arthritis    Celiac disease    Coronary artery disease    Dairy allergy    Depression    Diabetes mellitus without complication (HCC)    High cholesterol    Hypertension    Hypothyroidism    Myocardial infarction (HCC)    OSA (obstructive sleep apnea)    Paranoia (HCC)    Schizoaffective disorder (HCC)    Past Surgical History:  Procedure Laterality Date   APPENDECTOMY     CARDIAC CATHETERIZATION     CORONARY STENT INTERVENTION N/A 06/06/2020   Procedure: CORONARY STENT INTERVENTION;  Surgeon: Swaziland, Peter M, Matthew [name];  Location: [facility] INVASIVE CV LAB;  Service: Cardiovascular;  Laterality: N/A;  prox LAD, distal RCA   CORONARY ULTRASOUND/IVUS N/A 06/06/2020   Procedure: Intravascular Ultrasound/IVUS;  Surgeon: Swaziland, Peter M, Matthew [name];  Location: [facility] INVASIVE CV LAB;  Service: Cardiovascular;  Laterality: N/A;   LEFT HEART CATH AND CORONARY ANGIOGRAPHY N/A 06/06/2020   Procedure: LEFT HEART CATH AND CORONARY ANGIOGRAPHY;  Surgeon: Swazil[place], Peter M, Matthew [name];  Location: [facility] INVASIVE CV LAB;  Service: Cardiovascular;  Laterality: N/A;   LEFT HEART CATH AND CORONARY ANGIOGRAPHY N/A 02/24/2021   Procedure: LEFT HEART CATH AND CORONARY ANGIOGRAPHY;  Surgeon: Claudene Victory ORN, Matthew [name];  Location: [facility] INVASIVE CV LAB;  Service: Cardiovascular;  Laterality: N/A;   NASAL SEPTUM SURGERY  TOTAL SHOULDER ARTHROPLASTY Right 11/28/2021   Procedure: TOTAL SHOULDER ARTHROPLASTY;  Surgeon: Josefina Chew, Matthew [name];  Location: [facility] OR[facility];  Service: Orthopedics;  Laterality: Right;   WRIST FRACTURE SURGERY     right wrist    Allergies  Allergies  Allergen Reactions   Gluten Meal Other (See Comments)    Celiac disease   Lactose Intolerance (Gi) Diarrhea    Can tolerate hard cheese    Home Medications    Prior to Admission medications   Medication Sig Start Date End Date Taking?  Authorizing Provider  busPIRone  (BUSPAR ) 15 MG tablet 1/3 tablet twice daily for 1 week, then increase to 2/3 tablet twice daily for 1 week, then increase to 1 tablet twice daily for anxiety. 01/24/24   Matthew Boyer T, PA-C  ARIPiprazole  (ABILIFY ) 20 MG tablet TAKE 1 TABLET BY MOUTH EVERY DAY 01/19/24   Matthew Boyer DASEN, PA-C  aspirin  EC 81 MG tablet Take 1 tablet (81 mg total) by mouth daily. Swallow whole. 07/08/22   Matthew Drilling, Matthew [name]  atomoxetine  (STRATTERA ) 100 MG capsule TAKE 1 CAPSULE BY MOUTH EVERY DAY 01/19/24   Matthew Boyer T, PA-C  atorvastatin  (LIPITOR ) 80 MG tablet Take 1 tablet (80 mg total) by mouth daily. 06/20/23   Matthew Matthew SAUNDERS, Matthew [name]  Bempedoic Acid-Ezetimibe  (NEXLIZET ) 180-10 MG TABS Take 1 tablet by mouth daily. 02/11/23   Matthew Candyce RAMAN, Matthew [name]  carvedilol  (COREG ) 12.5 MG tablet Take 3 tablets (37.5 mg total) by mouth 2 (two) times daily. 06/20/23   Matthew Matthew SAUNDERS, Matthew [name]  ciclopirox  (PENLAC ) 8 % solution Apply topically at bedtime. Apply over nail and surrounding skin. Apply daily over previous coat. After seven (7) days, may remove with alcohol and continue cycle. 07/30/23   Matthew [name] [name], DPM  clopidogrel  (PLAVIX ) 75 MG tablet Take 1 tablet (75 mg total) by mouth daily. 12/18/23   Matthew Matthew SAUNDERS, Matthew [name]  DULoxetine  (CYMBALTA ) 60 MG capsule Take 2 capsules (120 mg total) by mouth daily. 09/16/23   Matthew Boyer DASEN, PA-C  empagliflozin  (JARDIANCE ) 25 MG TABS tablet Take 1 tablet (25 mg total) by mouth every morning. 11/04/23   Shamleffer, Donell Cardinal, Matthew [name]  eszopiclone  (LUNESTA ) 2 MG TABS tablet Take 1 tablet (2 mg total) by mouth at bedtime as needed for sleep. Take immediately before bedtime 01/16/24   Matthew Boyer T, PA-C  fenofibrate  (TRICOR ) 145 MG tablet Take 1 tablet (145 mg total) by mouth daily. 11/11/23   Matthew Matthew SAUNDERS, Matthew [name]  gabapentin  (NEURONTIN ) 800 MG tablet TAKE 1 TABLET BY MOUTH 4 TIMES A DAY WITH AN EXTRA 1/2 TABLET DURING THE DAY AS NEEDED 09/16/23   Matthew Boyer T, PA-C   hydrOXYzine  (ATARAX ) 50 MG tablet TAKE 1 TABLET BY MOUTH EVERY 6 HOURS AS NEEDED 11/10/23   Matthew Boyer T, PA-C  icosapent  Ethyl (VASCEPA ) 1 g capsule Take 2 capsules (2 g total) by mouth 2 (two) times daily. 10/04/23   Wyn Jackee VEAR Mickey., Matthew [name]  insulin  glargine (LANTUS  SOLOSTAR) 100 UNIT/ML Solostar Pen Inject 20 Units into the skin daily. 11/04/23   Shamleffer, Ibtehal Jaralla, Matthew [name]  Insulin  Pen Needle 32G X 4 MM MISC 1 Device by Does not apply route daily in the afternoon. 07/04/23   Shamleffer, Ibtehal Jaralla, Matthew [name]  metFORMIN  (GLUCOPHAGE -XR) 750 MG 24 hr tablet Take 2 tablets (1,500 mg total) by mouth daily with breakfast. 11/04/23   Shamleffer, Donell Cardinal, Matthew [name]  Semaglutide , 2 MG/DOSE, (OZEMPIC , 2 MG/DOSE,) 8 MG/3ML SOPN INJECT 2MG  DOSE INTO THE SKIN  ONE TIME PER WEEK 11/29/23   Shamleffer, Donell Cardinal, Matthew [name]  Tavaborole  (KERYDIN ) 5 % SOLN Apply 1 drop topically daily. Apply 1 drop to the toenail daily. 12/06/23   Matthew [name] [name], DPM  valbenazine  (INGREZZA ) 80 MG capsule Take 1 capsule (80 mg total) by mouth daily. 09/16/23   Matthew Verneita DASEN, PA-C    Physical Exam    Vital Signs:  Matthew [name] does not have vital signs available for review today.  Given telephonic nature of communication, physical exam is limited. AAOx3. NAD. Normal affect.  Speech and respirations are unlabored.  Accessory Clinical Findings    None  Assessment & Plan    1.  Preoperative Cardiovascular Risk Assessment:   Mr. Ohern perioperative risk of a major cardiac event is 6.6% according to the Revised Cardiac Risk Index (RCRI).  Therefore, he is at high risk for perioperative complications.   His functional capacity is good at 5.81 METs according to the Duke Activity Status Index (DASI). Recommendations: According to ACC/AHA guidelines, no further cardiovascular testing needed.  The patient may proceed to surgery at acceptable risk.   Antiplatelet and/or Anticoagulation Recommendations: Clopidogrel  (Plavix )  can be held for 5 days prior to his surgery and resumed as soon as possible post op.  The patient was advised that if he develops new symptoms prior to surgery to contact our office to arrange for a follow-up visit, and he verbalized understanding.   A copy of this note will be routed to requesting surgeon.  Time:   Today, I have spent 10 minutes with the patient with telehealth technology discussing medical history, symptoms, and management plan.     Orren LOISE Fabry, PA-C  01/31/2024, 1:12 PM

## 2024-01-31 ENCOUNTER — Ambulatory Visit: Attending: Cardiology

## 2024-01-31 NOTE — Telephone Encounter (Signed)
 Patient with ENT procedure (sleep endoscopy) scheduled for 02/06/24.

## 2024-01-31 NOTE — Progress Notes (Signed)
See other office visit.

## 2024-02-04 ENCOUNTER — Encounter (HOSPITAL_COMMUNITY): Payer: Self-pay | Admitting: Otolaryngology

## 2024-02-04 ENCOUNTER — Other Ambulatory Visit: Payer: Self-pay

## 2024-02-04 NOTE — Progress Notes (Signed)
 PCP - Dr Elveria Kaiser Cardiologist - Dr Wilbert Bihari (clearance 01/30/24)  Chest x-ray - 09/02/23 EKG - 10/04/23 Stress Test - none ECHO - 04/24/21 Cardiac Cath - 02/24/21  ICD Pacemaker/Loop - n/a  Sleep Study -  Yes (12/27/23) CPAP - does not use CPAP  Diabetes Type 2 Do not take Metformin  on the morning of surgery.  Hold Jardiance  72 hours priorto procedure.  Last dose was on 02/02/24.  Hold Ozempic  7 days or before procedure.  Last dose was on 02/03/24    THE MORNING OF SURGERY, take 10 units of Lantus  Insulin .  Plavix  - Hold 5 days prior to procedure.  Last dose was on 02/01/24 per patient.  Aspirin  Instructions: Patient agreed to call MD for ASA instructions prior to surgery.  ERAS - clear liquids til 11:30 AM DOS.  Anesthesia review: Yes  STOP now taking any Aspirin  (unless otherwise instructed by your surgeon), Aleve, Naproxen, Ibuprofen , Motrin , Advil , Goody's, BC's, all herbal medications, fish oil, and all vitamins.   Coronavirus Screening Do you have any of the following symptoms:  Cough yes/no: No Fever (>100.7F)  yes/no: No Runny nose yes/no: No Sore throat yes/no: No Difficulty breathing/shortness of breath  yes/no: No  Have you traveled in the last 14 days and where? yes/no: No  Patient verbalized understanding of instructions that were given via phone.

## 2024-02-05 NOTE — Anesthesia Preprocedure Evaluation (Signed)
 Anesthesia Evaluation    Airway        Dental   Pulmonary Current Smoker          Cardiovascular hypertension,      Neuro/Psych    GI/Hepatic   Endo/Other  diabetes    Renal/GU      Musculoskeletal   Abdominal   Peds  Hematology   Anesthesia Other Findings   Reproductive/Obstetrics                              Anesthesia Physical Anesthesia Plan  ASA:   Anesthesia Plan:    Post-op Pain Management:    Induction:   PONV Risk Score and Plan:   Airway Management Planned:   Additional Equipment:   Intra-op Plan:   Post-operative Plan:   Informed Consent:   Plan Discussed with:   Anesthesia Plan Comments: (See PAT note written 02/05/2024 by Mael Delap, PA-C. Case to be rescheduled after GLP1 agonist held for at least 7 days. )        Anesthesia Quick Evaluation

## 2024-02-05 NOTE — Telephone Encounter (Signed)
 Per Katlyn West, NP preop clearance was completed on 01/30/24.  Will re fax the clearance to the surgeons office

## 2024-02-05 NOTE — Progress Notes (Signed)
 Anesthesia Chart Review: SAME DAY WORK-UP  Case: [id number] Date/Time: 02/06/24 1430   Procedure: DRUG INDUCED SLEEP ENDOSCOPY (Bilateral)   Anesthesia type: General   Diagnosis: Obstructive sleep apnea [G47.33]   Pre-op diagnosis: OBSTRUCTIVE SLEEP APNEA   [facility] / [facility]   Surgeons: Carlie Clark, MD       DISCUSSION: Patient is a 48 year old male scheduled for the above procedure.  History includes smoking, HTN, CAD (NSTEMI, s/p DES pLAD & DES dRCA 06/06/2020 with EF 25-35%, EF 60-65% 04/24/2021), HLD, DM2, hypothyroidism, OSA (moderate OSA 12/24/2023; does not use CPAP), Celiac disease, elevated LFTs, ADHD, Bipolar disorder, paranoia, osteoarthritis.   Preoperative cardiology input per Lucien Blanc, PA-C on 01/30/2024: Mr. Carneiro perioperative risk of a major cardiac event is 6.6% according to the Revised Cardiac Risk Index (RCRI).  Therefore, he is at high risk for perioperative complications.   His functional capacity is good at 5.81 METs according to the Duke Activity Status Index (DASI). Recommendations: According to ACC/AHA guidelines, no further cardiovascular testing needed.  The patient may proceed to surgery at acceptable risk.   Antiplatelet and/or Anticoagulation Recommendations: Clopidogrel  (Plavix ) can be held for 5 days prior to his surgery and resumed as soon as possible post op.... Last Plavix  02/01/2024.   Last BP 136/84 on 02/03/2024. Meds include Coreg  37.5 mg BID.  A1c 6.4% 11/04/2023. He is on metformin , Jardiance , Lantus . Semaglutide . Per PAT RN phone interview on 02/04/2024: Last dose semaglutide  02/03/2024. Last dose Jardiance  02/02/2024.    He did not hold semaglutide  for at least 7 days prior to procedure. Discussed with anesthesiologist Boone Fess, MD. Given anesthesia type needed for procedure, would advise postponing elective surgery to potentially decrease aspiration risk. Message left for LaTasha at Dr. Carlie' office. (UPDATE: I had not heard back  from [facility] yet, so I called and spoke with Tobias at Dr. Carlie office on 02/05/2024 2:10 PM. She is aware that anesthesiologist advised postponing surgery until GLP1 agonist is held for a full 7 days. They apparently did not have semaglutide  on their medication list. She called back later and said their staff will follow-up with patient and OR scheduling.)   VS: Ht 6' 3 (1.905 m)   Wt 120 kg   BMI 33.07 kg/m  BP Readings from Last 3 Encounters:  01/27/24 (!) 200/46  11/04/23 118/76  10/04/23 110/72   Pulse Readings from Last 3 Encounters:  01/27/24 88  11/04/23 94  10/04/23 91   PCP BP follow-up 02/03/2024: 136/84. No medication changes made.    PROVIDERS: Jhon Elveria LABOR, MD is PCP  Shlomo Corning, MD is cardiologist Sam Litten, MD is endocrinology Rhys Boyer, PA-C is psychiatry provider   LABS: For day of surgery. Most recent results in [facility] include: Lab Results  Component Value Date   WBC 10.7 (H) 09/02/2023   HGB 15.5 09/02/2023   HCT 46.5 09/02/2023   PLT 294 09/02/2023   GLUCOSE 156 (H) 09/02/2023   ALT 115 (H) 07/29/2023   AST 59 (H) 07/29/2023   NA 135 09/02/2023   K 4.7 09/02/2023   CL 100 09/02/2023   CREATININE 1.08 09/02/2023   BUN 20 09/02/2023   CO2 24 09/02/2023   HGBA1C 6.4 (A) 11/04/2023   MICROALBUR 0.2 11/04/2023    Sleep Study 12/24/2023: Diagnosis: Moderate Obstructive Sleep Apnea Nocturnal Hypoxemia Recommendations: 1.Recommend a trial of ResMed CPAP at 10cm H2O with EPR 2 and medium ResMed AirFit F30 mask and heated humidity.SABRASABRA  IMAGES: 1V CXR 09/02/2023: FINDINGS: No consolidation, pneumothorax or effusion. Normal cardiopericardial silhouette without edema. Right shoulder arthroplasty. Air-fluid level along the stomach beneath the left hemidiaphragm. IMPRESSION: No acute cardiopulmonary disease.   EKG: 10/04/2023: Sinus rhythm with 1st degree A-V block Incomplete right bundle branch block Left posterior fascicular  block Incomplete right bundle branch block is now Present T wave inversion now evident in Inferior leads Confirmed by Wyn Manus [phone]) on 10/04/2023 9:14:38 AM   CV: US  Carotid 06/14/2021: Summary:  - Right Carotid: The extracranial vessels were near-normal with only minimal wall thickening or plaque.  - Left Carotid: The extracranial vessels were near-normal with only minimal wall thickening or plaque.  - Vertebrals:  Bilateral vertebral arteries demonstrate antegrade flow.  - Subclavians: Normal flow hemodynamics were seen in bilateral subclavian arteries.    Long term monitor 05/01/2021 - 05/13/2021: Patch wear time was 11 days and 17 hours Predominant rhythm was NSR/sinus tachycardia with average HR 101bpm (ranging 56-141) Rare ectopy (<1% SVE; <1% VE) No sustained arrhythmias or significant pauses.   Echo 04/24/2021: IMPRESSIONS   1. Left ventricular ejection fraction, by estimation, is 60 to 65%. The  left ventricle has normal function. The left ventricle has no regional  wall motion abnormalities. There is mild left ventricular hypertrophy.  Left ventricular diastolic parameters  were normal.   2. Right ventricular systolic function is normal. The right ventricular  size is normal. Tricuspid regurgitation signal is inadequate for assessing  PA pressure.   3. The mitral valve is normal in structure. No evidence of mitral valve  regurgitation.   4. The aortic valve was not well visualized. Aortic valve regurgitation  is not visualized. No aortic stenosis is present.  - Comparison 02/24/2021 LHC EF 50%; 08/12/2020 TTE LVEF 65-70%, no RWMA; 06/06/20 LHC with PCI for NSTEMI: LVEF 25-35%, akinesis of mid anteroseptal and apical hypokinesis, mildly dilated LV, LVEDP moderately elevated   Cardiac cath 02/24/2021: CONCLUSIONS: Normal left main Patent LAD stent with minimal luminal irregularities otherwise. 25% mid circumflex. Dominant right coronary with patent distal RCA  stent.  Proximal tandem 25 and 45% stenoses.  Mild to moderate diffuse atherosclerosis noted in the right coronary. Anterior hypokinesis with EF 50%.  LVEDP 7 mmHg.   RECOMMENDATIONS:  Aggressive risk factor modification including smoking cessation, glycemic control, blood pressure control, and lipid management of LDL to less than 70.   Past Medical History:  Diagnosis Date   ADHD (attention deficit hyperactivity disorder)    Anxiety    Arthritis    Celiac disease    Coronary artery disease    Dairy allergy    Depression    Diabetes mellitus without complication (HCC)    type 2   Elevated liver enzymes    High cholesterol    Hypertension    Hypothyroidism    Myocardial infarction (HCC)    OSA (obstructive sleep apnea)    Paranoia (HCC)    Schizoaffective disorder (HCC)    and bipolar    Past Surgical History:  Procedure Laterality Date   APPENDECTOMY     CARDIAC CATHETERIZATION     COLONOSCOPY     CORONARY STENT INTERVENTION N/A 06/06/2020   Procedure: CORONARY STENT INTERVENTION;  Surgeon: Swaziland, Peter M, MD;  [facility];  Service: Cardiovascular;  Laterality: N/A;  prox LAD, distal RCA   CORONARY ULTRASOUND/IVUS N/A 06/06/2020   Procedure: Intravascular Ultrasound/IVUS;  Surgeon: Swaziland, Peter M, MD;  Location: [facility] INVASIVE CV LAB;  Service: Cardiovascular;  Laterality: N/A;   LEFT HEART CATH AND CORONARY ANGIOGRAPHY N/A 06/06/2020   Procedure: LEFT HEART CATH AND CORONARY ANGIOGRAPHY;  Surgeon: Swaziland, Peter M, MD;  Location: [facility] INVASIVE CV LAB;  Service: Cardiovascular;  Laterality: N/A;   LEFT HEART CATH AND CORONARY ANGIOGRAPHY N/A 02/24/2021   Procedure: LEFT HEART CATH AND CORONARY ANGIOGRAPHY;  Surgeon: Claudene Victory ORN, MD;  [facility]: [facility] INV[facility] CV LAB;  Service: Cardiovascular;  Laterality: N/A;   NASAL SEPTUM SURGERY     TOTAL SHOULDER ARTHROPLASTY Right 11/28/2021   Procedure: TOTAL SHOULDER ARTHROPLASTY;  Surgeon: Josefina Chew, MD;   Location: [facility] ORS;  Service: Orthopedics;  Laterality: Right;   UPPER GI ENDOSCOPY     WRIST FRACTURE SURGERY     right wrist    MEDICATIONS: No current facility-administered medications for this encounter.    ARIPiprazole  (ABILIFY ) 20 MG tablet   aspirin  EC 81 MG tablet   atomoxetine  (STRATTERA ) 100 MG capsule   atorvastatin  (LIPITOR ) 80 MG tablet   Bempedoic Acid-Ezetimibe  (NEXLIZET ) 180-10 MG TABS   busPIRone  (BUSPAR ) 15 MG tablet   carvedilol  (COREG ) 12.5 MG tablet   clopidogrel  (PLAVIX ) 75 MG tablet   DULoxetine  (CYMBALTA ) 60 MG capsule   empagliflozin  (JARDIANCE ) 25 MG TABS tablet   eszopiclone  (LUNESTA ) 2 MG TABS tablet   fenofibrate  (TRICOR ) 145 MG tablet   gabapentin  (NEURONTIN ) 800 MG tablet   hydrOXYzine  (ATARAX ) 50 MG tablet   icosapent  Ethyl (VASCEPA ) 1 g capsule   insulin  glargine (LANTUS  SOLOSTAR) 100 UNIT/ML Solostar Pen   metFORMIN  (GLUCOPHAGE -XR) 750 MG 24 hr tablet   omeprazole (PRILOSEC) 40 MG capsule   Semaglutide , 2 MG/DOSE, (OZEMPIC , 2 MG/DOSE,) 8 MG/3ML SOPN   tadalafil (CIALIS) 5 MG tablet   valbenazine  (INGREZZA ) 80 MG capsule   Insulin  Pen Needle 32G X 4 MM MISC   Tavaborole  (KERYDIN ) 5 % SOLN    Gerarda Conklin, PA-C Surgical Short Stay/Anesthesiology [facility] Phone [phone] [facility] Phone [phone] 02/05/2024 4:37 PM

## 2024-02-05 NOTE — Telephone Encounter (Signed)
 Office called to f/u on clearance being that pt is scheduled for procedure on 02/06/24. Please advise

## 2024-02-06 ENCOUNTER — Encounter (HOSPITAL_COMMUNITY): Payer: Self-pay | Admitting: Vascular Surgery

## 2024-02-06 ENCOUNTER — Encounter (HOSPITAL_COMMUNITY): Admission: RE | Payer: Self-pay | Source: Home / Self Care

## 2024-02-06 ENCOUNTER — Ambulatory Visit (HOSPITAL_COMMUNITY): Admission: RE | Admit: 2024-02-06 | Source: Home / Self Care | Admitting: Otolaryngology

## 2024-02-06 ENCOUNTER — Other Ambulatory Visit: Payer: Self-pay | Admitting: Otolaryngology

## 2024-02-06 HISTORY — DX: Abnormal levels of other serum enzymes: R74.8

## 2024-02-06 HISTORY — DX: Attention-deficit hyperactivity disorder, unspecified type: F90.9

## 2024-02-06 SURGERY — DRUG INDUCED SLEEP ENDOSCOPY
Anesthesia: General | Laterality: Bilateral

## 2024-02-06 NOTE — Telephone Encounter (Addendum)
 Per Orren Fabry, PA-C of [facility], per office protocol, if patient is without any new symptoms or concerns at the time of their virtual visit, he may hold Plavix  for 5 days prior to procedure. Please resume Plavix  as soon as possible postprocedure, at the discretion of the surgeon. Patient made aware of holding Plavix  and voiced understanding.      ----- Message from Lela T sent at 01/23/2024  5:40 PM EDT ----- Regarding: CLEARANCE NEEDED Walterine Browning,   Can you reach out for cardiac and medication clearance for this patient please? They will have their DISE on 02/06/24 at [facility].   No precert required, you can schedule.  Please schedule patient for DISE with Dr. Carlie.    Thanks,  Lela LANES ----- Message ----- From: Mliss Raddle Sent: 01/22/2024   9:24 AM EDT To: [facility] Requests  No precert required, you can schedule. Please schedule patient for DISE with Dr. Carlie. ----- Message ----- From: Velia Cecilio Paulino Benay, PA-C Sent: 01/21/2024   9:27 AM EDT To: [facility] Scheduling  Please schedule patient for DISE with Dr. Carlie.

## 2024-02-08 ENCOUNTER — Other Ambulatory Visit: Payer: Self-pay | Admitting: Physician Assistant

## 2024-02-13 ENCOUNTER — Ambulatory Visit (HOSPITAL_COMMUNITY): Admitting: Certified Registered"

## 2024-02-13 ENCOUNTER — Encounter (HOSPITAL_COMMUNITY)
Admission: RE | Disposition: A | Discharge status: Home / Self Care | Payer: Self-pay | Source: Home / Self Care | Attending: Otolaryngology

## 2024-02-13 ENCOUNTER — Other Ambulatory Visit: Payer: Self-pay

## 2024-02-13 ENCOUNTER — Ambulatory Visit (HOSPITAL_COMMUNITY)
Admission: RE | Admit: 2024-02-13 | Discharge: 2024-02-13 | Disposition: A | Discharge status: Home / Self Care | Attending: Otolaryngology | Admitting: Otolaryngology

## 2024-02-13 ENCOUNTER — Encounter (HOSPITAL_COMMUNITY): Payer: Self-pay | Admitting: Otolaryngology

## 2024-02-13 DIAGNOSIS — Z955 Presence of coronary angioplasty implant and graft: Secondary | ICD-10-CM | POA: Diagnosis not present

## 2024-02-13 DIAGNOSIS — F17210 Nicotine dependence, cigarettes, uncomplicated: Secondary | ICD-10-CM

## 2024-02-13 DIAGNOSIS — I10 Essential (primary) hypertension: Secondary | ICD-10-CM | POA: Diagnosis not present

## 2024-02-13 DIAGNOSIS — E119 Type 2 diabetes mellitus without complications: Secondary | ICD-10-CM | POA: Insufficient documentation

## 2024-02-13 DIAGNOSIS — I252 Old myocardial infarction: Secondary | ICD-10-CM | POA: Insufficient documentation

## 2024-02-13 DIAGNOSIS — I2510 Atherosclerotic heart disease of native coronary artery without angina pectoris: Secondary | ICD-10-CM | POA: Insufficient documentation

## 2024-02-13 DIAGNOSIS — G4733 Obstructive sleep apnea (adult) (pediatric): Secondary | ICD-10-CM | POA: Insufficient documentation

## 2024-02-13 DIAGNOSIS — Z794 Long term (current) use of insulin: Secondary | ICD-10-CM | POA: Insufficient documentation

## 2024-02-13 DIAGNOSIS — Z7984 Long term (current) use of oral hypoglycemic drugs: Secondary | ICD-10-CM | POA: Insufficient documentation

## 2024-02-13 DIAGNOSIS — K219 Gastro-esophageal reflux disease without esophagitis: Secondary | ICD-10-CM | POA: Insufficient documentation

## 2024-02-13 HISTORY — PX: DRUG INDUCED ENDOSCOPY: SHX6808

## 2024-02-13 LAB — GLUCOSE, CAPILLARY: Glucose-Capillary: 128 mg/dL — ABNORMAL HIGH (ref 70–99)

## 2024-02-13 SURGERY — DRUG INDUCED SLEEP ENDOSCOPY
Anesthesia: Monitor Anesthesia Care | Laterality: Bilateral

## 2024-02-13 MED ORDER — LIDOCAINE 2% (20 MG/ML) 5 ML SYRINGE
INTRAMUSCULAR | Status: DC | PRN
  Administered 2024-02-13: 20 mg via INTRAVENOUS

## 2024-02-13 MED ORDER — PROPOFOL 10 MG/ML IV BOLUS
INTRAVENOUS | Status: DC | PRN
  Administered 2024-02-13 (×2): 50 mg via INTRAVENOUS

## 2024-02-13 MED ORDER — OXYMETAZOLINE HCL 0.05 % NA SOLN
NASAL | Status: AC
  Filled 2024-02-13: qty 15

## 2024-02-13 MED ORDER — PROPOFOL 500 MG/50ML IV EMUL
INTRAVENOUS | Status: DC | PRN
  Administered 2024-02-13: 100 ug/kg/min via INTRAVENOUS

## 2024-02-13 NOTE — H&P (Signed)
 Matthew [name] is an 48 y.o. male.   Chief Complaint: Sleep apnea HPI: 48 year old male with obstructive sleep apnea who has been unable to tolerate CPAP.  Past Medical History:  Diagnosis Date   ADHD (attention deficit hyperactivity disorder)    Anxiety    Arthritis    Celiac disease    Coronary artery disease    Dairy allergy    Depression    Diabetes mellitus without complication (HCC)    type 2   Elevated liver enzymes    High cholesterol    Hypertension    Hypothyroidism    Myocardial infarction (HCC)    OSA (obstructive sleep apnea)    Paranoia (HCC)    Schizoaffective disorder (HCC)    and bipolar    Past Surgical History:  Procedure Laterality Date   APPENDECTOMY     CARDIAC CATHETERIZATION     COLONOSCOPY     CORONARY STENT INTERVENTION N/A 06/06/2020   Procedure: CORONARY STENT INTERVENTION;  Surgeon: Swaziland, Peter M, MD;  [facility] INVAS[facility] CV LAB;  Service: Cardiovascular;  Laterality: N/A;  prox LAD, distal RCA   CORONARY ULTRASOUND/IVUS N/A 06/06/2020   Procedure: Intravascular Ultrasound/IVUS;  Surgeon: Swaziland, Peter M, MD;  Location[facility] INVASIVE CV LAB;  Service: Cardiovascular;  Laterality: N/A;   LEFT HEART CATH AND CORONARY ANGIOGRAPHY N/A 06/06/2020   Procedure: LEFT HEART CATH AND CORONARY ANGIOGRAPHY;  Surgeon: Swaziland, Peter M, MD;  Location: [facility] INVASIVE CV LAB;  Service: Cardiovascular;  Laterality: N/A;   LEFT HEART CATH AND CORONARY ANGIOGRAPHY N/A 02/24/2021   Procedure: LEFT HEART CATH AND CORONARY ANGIOGRAPHY;  Surgeon: Claudene Victory ORN, MD;  [facility] CV LAB;  Service: Cardiovascular;  Laterality: N/A;   NASAL SEPTUM SURGERY     TOTAL SHOULDER ARTHROPLASTY Right 11/28/2021   Procedure: TOTAL SHOULDER ARTHROPLASTY;  Surgeon: Josefina Chew, MD;  Location: [facility] ORS;  Service: Orthopedics;  Laterality: Right;   UPPER GI ENDOSCOPY     WRIST FRACTURE SURGERY     right wrist    Family History  Problem Relation Age of Onset   Asthma  Mother    Hypertension Mother    CVA Mother    Hypertension Father    Leukemia Sister    Social History:  reports that he has been smoking cigarettes. He has a 1 pack-year smoking history. He has never used smokeless tobacco. He reports that he does not drink alcohol and does not use drugs.  Allergies:  Allergies  Allergen Reactions   Gluten Meal Other (See Comments)    Celiac disease   Lactose Intolerance (Gi) Diarrhea    Can tolerate hard cheese    Medications Prior to Admission  Medication Sig Dispense Refill   ARIPiprazole  (ABILIFY ) 20 MG tablet TAKE 1 TABLET BY MOUTH EVERY DAY 90 tablet 0   aspirin  EC 81 MG tablet Take 1 tablet (81 mg total) by mouth daily. Swallow whole. 30 tablet 0   atomoxetine  (STRATTERA ) 100 MG capsule TAKE 1 CAPSULE BY MOUTH EVERY DAY 90 capsule 0   atorvastatin  (LIPITOR ) 80 MG tablet Take 1 tablet (80 mg total) by mouth daily. 90 tablet 3   Bempedoic Acid-Ezetimibe  (NEXLIZET ) 180-10 MG TABS Take 1 tablet by mouth daily. 90 tablet 3   busPIRone  (BUSPAR ) 15 MG tablet 1/3 tablet twice daily for 1 week, then increase to 2/3 tablet twice daily for 1 week, then increase to 1 tablet twice daily for anxiety. (Patient taking differently: Take 15 mg by  mouth daily.) 60 tablet 1   carvedilol  (COREG ) 12.5 MG tablet Take 3 tablets (37.5 mg total) by mouth 2 (two) times daily. 540 tablet 3   DULoxetine  (CYMBALTA ) 60 MG capsule Take 2 capsules (120 mg total) by mouth daily. 180 capsule 1   empagliflozin  (JARDIANCE ) 25 MG TABS tablet Take 1 tablet (25 mg total) by mouth every morning. 90 tablet 3   eszopiclone  (LUNESTA ) 2 MG TABS tablet Take 1 tablet (2 mg total) by mouth at bedtime as needed for sleep. Take immediately before bedtime 30 tablet 0   fenofibrate  (TRICOR ) 145 MG tablet Take 1 tablet (145 mg total) by mouth daily. 90 tablet 3   gabapentin  (NEURONTIN ) 800 MG tablet TAKE 1 TABLET BY MOUTH 4 TIMES A DAY WITH AN EXTRA 1/2 TABLET DURING THE DAY AS NEEDED 135 tablet  5   hydrOXYzine  (ATARAX ) 50 MG tablet TAKE 1 TABLET BY MOUTH EVERY 6 HOURS AS NEEDED 360 tablet 0   insulin  glargine (LANTUS  SOLOSTAR) 100 UNIT/ML Solostar Pen Inject 20 Units into the skin daily. 15 mL 4   metFORMIN  (GLUCOPHAGE -XR) 750 MG 24 hr tablet Take 2 tablets (1,500 mg total) by mouth daily with breakfast. 180 tablet 3   tadalafil (CIALIS) 5 MG tablet Take 5 mg by mouth daily.     valbenazine  (INGREZZA ) 80 MG capsule Take 1 capsule (80 mg total) by mouth daily. 30 capsule 5   clopidogrel  (PLAVIX ) 75 MG tablet Take 1 tablet (75 mg total) by mouth daily. 90 tablet 1   icosapent  Ethyl (VASCEPA ) 1 g capsule Take 2 capsules (2 g total) by mouth 2 (two) times daily. 360 capsule 3   Insulin  Pen Needle 32G X 4 MM MISC 1 Device by Does not apply route daily in the afternoon. 100 each 3   omeprazole (PRILOSEC) 40 MG capsule Take 40 mg by mouth daily.     Semaglutide , 2 MG/DOSE, (OZEMPIC , 2 MG/DOSE,) 8 MG/3ML SOPN INJECT 2MG  DOSE INTO THE SKIN ONE TIME PER WEEK (Patient taking differently: No sig reported) 9 mL 0   Tavaborole  (KERYDIN ) 5 % SOLN Apply 1 drop topically daily. Apply 1 drop to the toenail daily. (Patient not taking: Reported on 02/04/2024) 10 mL 2    Results for orders placed or performed during the hospital encounter of 02/13/24 (from the past 48 hours)  Glucose, capillary     Status: Abnormal   Collection Time: 02/13/24 11:41 AM  Result Value Ref Range   Glucose-Capillary 128 (H) 70 - 99 mg/dL    Comment: Glucose reference range applies only to samples taken after fasting for at least 8 hours.   No results found.  Review of Systems  All other systems reviewed and are negative.   Blood pressure (!) (P) 132/90, pulse (P) 83, temperature (P) 97.6 F (36.4 C), temperature source (P) Temporal, resp. rate (P) 12, height (P) 6' 3 (1.905 m), weight (P) 120 kg, SpO2 (P) 96%. Physical Exam Constitutional:      Appearance: Normal appearance. He is normal weight.  HENT:     Head:  Normocephalic and atraumatic.     Right Ear: External ear normal.     Left Ear: External ear normal.     Nose: Nose normal.     Mouth/Throat:     Mouth: Mucous membranes are moist.     Pharynx: Oropharynx is clear.  Eyes:     Extraocular Movements: Extraocular movements intact.     Pupils: Pupils are equal, round, and reactive to  light.  Cardiovascular:     Rate and Rhythm: Normal rate.  Pulmonary:     Effort: Pulmonary effort is normal.  Skin:    General: Skin is warm and dry.  Neurological:     General: No focal deficit present.     Mental Status: He is alert and oriented to person, place, and time.  Psychiatric:        Mood and Affect: Mood normal.        Behavior: Behavior normal.        Thought Content: Thought content normal.        Judgment: Judgment normal.      Assessment/Plan Obstructive sleep apnea and BMI 33.07  To OR for sleep endoscopy.  Vaughan Ricker, MD 02/13/2024, 12:23 PM

## 2024-02-13 NOTE — Transfer of Care (Signed)
 Immediate Anesthesia Transfer of Care Note  Patient: Matthew [name]  Procedure(s) Performed: DRUG INDUCED SLEEP ENDOSCOPY (Bilateral)  Patient Location: PACU and Endoscopy Unit  Anesthesia Type:MAC  Level of Consciousness: awake, drowsy, patient cooperative, and responds to stimulation  Airway & Oxygen Therapy: Patient Spontanous Breathing and Patient connected to nasal cannula oxygen  Post-op Assessment: Report given to RN and Post -op Vital signs reviewed and stable  Post vital signs: Reviewed and stable  Last Vitals:  Vitals Value Taken Time  BP    Temp    Pulse    Resp    SpO2      Last Pain:  Vitals:   02/13/24 1117  TempSrc: (P) Temporal  PainSc: (P) 0-No pain         Complications: No notable events documented.

## 2024-02-13 NOTE — Anesthesia Preprocedure Evaluation (Signed)
 Anesthesia Evaluation  Patient identified by MRN, date of birth, ID band Patient awake    Reviewed: Allergy & Precautions, NPO status , Patient's Chart, lab work & pertinent test results  Airway Mallampati: III  TM Distance: >3 FB Neck ROM: Full    Dental no notable dental hx.    Pulmonary sleep apnea , Current Smoker   Pulmonary exam normal        Cardiovascular hypertension, Pt. on medications and Pt. on home beta blockers + CAD, + Past MI and + Cardiac Stents (2022)   Rhythm:Regular Rate:Normal  Echo 04/24/2021: IMPRESSIONS   1. Left ventricular ejection fraction, by estimation, is 60 to 65%. The  left ventricle has normal function. The left ventricle has no regional  wall motion abnormalities. There is mild left ventricular hypertrophy.  Left ventricular diastolic parameters  were normal.   2. Right ventricular systolic function is normal. The right ventricular  size is normal. Tricuspid regurgitation signal is inadequate for assessing  PA pressure.   3. The mitral valve is normal in structure. No evidence of mitral valve  regurgitation.   4. The aortic valve was not well visualized. Aortic valve regurgitation  is not visualized. No aortic stenosis is present.  - Comparison 02/24/2021 LHC EF 50%; 08/12/2020 TTE LVEF 65-70%, no RWMA; 06/06/20 LHC with PCI for NSTEMI: LVEF 25-35%, akinesis of mid anteroseptal and apical hypokinesis, mildly dilated LV, LVEDP moderately elevated    Neuro/Psych   Anxiety Depression Bipolar Disorder Schizophrenia     GI/Hepatic Neg liver ROS,GERD  Medicated,,  Endo/Other  diabetes, Type 2, Insulin  Dependent, Oral Hypoglycemic AgentsHypothyroidism    Renal/GU   negative genitourinary   Musculoskeletal  (+) Arthritis , Osteoarthritis,    Abdominal Normal abdominal exam  (+)   Peds  Hematology   Anesthesia Other Findings   Reproductive/Obstetrics                               Anesthesia Physical Anesthesia Plan  ASA: 3  Anesthesia Plan: MAC   Post-op Pain Management:    Induction: Intravenous  PONV Risk Score and Plan: 1 and Propofol  infusion and Treatment may vary due to age or medical condition  Airway Management Planned: Simple Face Mask and Nasal Cannula  Additional Equipment: None  Intra-op Plan:   Post-operative Plan:   Informed Consent: I have reviewed the patients History and Physical, chart, labs and discussed the procedure including the risks, benefits and alternatives for the proposed anesthesia with the patient or authorized representative who has indicated his/her understanding and acceptance.     Dental advisory given  Plan Discussed with: CRNA  Anesthesia Plan Comments:         Anesthesia Quick Evaluation

## 2024-02-13 NOTE — Op Note (Signed)
 Preop diagnosis: Obstructive sleep apnea Postop diagnosis: same Procedure: Drug-induced sleep endoscopy Surgeon: Carlie Anesth: IV sedation Compl: None Findings: There is 100% anterior-posterior collapse at the velum making him a candidate for hypoglossal nerve stimulator placement.  There was anterior-posterior and lateral collapse at the tongue base. Description:  After discussing risks, benefits, and alternatives, the patient was brought to the operative suite and placed on the operative table in the supine position.  Anesthesia was induced and the patient was given light sedation to simulate natural sleep. When the proper level was reached, an Afrin-soaked pledget was placed in the right nasal passage for a couple of minutes and then removed.  The fiberoptic laryngoscope was then passed to view the pharynx and larynx.  Findings are noted above and the exam was recorded.  After completion, the scope was removed and the patient was returned to anesthesia for wakeup and was moved to the recovery room in stable condition.

## 2024-02-14 ENCOUNTER — Encounter (HOSPITAL_COMMUNITY): Payer: Self-pay | Admitting: Otolaryngology

## 2024-02-14 NOTE — Anesthesia Postprocedure Evaluation (Signed)
 Anesthesia Post Note  Patient: ABHI MOCCIA  Procedure(s) Performed: DRUG INDUCED SLEEP ENDOSCOPY (Bilateral)     Patient location during evaluation: PACU Anesthesia Type: MAC Level of consciousness: awake and alert Pain management: pain level controlled Vital Signs Assessment: post-procedure vital signs reviewed and stable Respiratory status: spontaneous breathing, nonlabored ventilation, respiratory function stable and patient connected to nasal cannula oxygen Cardiovascular status: stable and blood pressure returned to baseline Postop Assessment: no apparent nausea or vomiting Anesthetic complications: no   No notable events documented.  Last Vitals:  Vitals:   02/13/24 1300 02/13/24 1310  BP: 124/76 128/89  Pulse: 96 93  Resp: 17 16  Temp:    SpO2: 96% 96%    Last Pain:  Vitals:   02/13/24 1310  TempSrc:   PainSc: 0-No pain                 Cordella P Mart Colpitts

## 2024-02-16 ENCOUNTER — Other Ambulatory Visit: Payer: Self-pay | Admitting: Physician Assistant

## 2024-02-16 NOTE — Telephone Encounter (Signed)
Verify how taking

## 2024-02-20 ENCOUNTER — Other Ambulatory Visit: Payer: Self-pay | Admitting: Interventional Cardiology

## 2024-02-21 ENCOUNTER — Other Ambulatory Visit: Payer: Self-pay | Admitting: Internal Medicine

## 2024-03-02 ENCOUNTER — Encounter: Payer: Self-pay | Admitting: Physician Assistant

## 2024-03-02 ENCOUNTER — Ambulatory Visit: Admitting: Physician Assistant

## 2024-03-02 DIAGNOSIS — F250 Schizoaffective disorder, bipolar type: Secondary | ICD-10-CM | POA: Diagnosis not present

## 2024-03-02 DIAGNOSIS — F411 Generalized anxiety disorder: Secondary | ICD-10-CM | POA: Diagnosis not present

## 2024-03-02 DIAGNOSIS — F4321 Adjustment disorder with depressed mood: Secondary | ICD-10-CM

## 2024-03-02 DIAGNOSIS — F5105 Insomnia due to other mental disorder: Secondary | ICD-10-CM | POA: Diagnosis not present

## 2024-03-02 DIAGNOSIS — F902 Attention-deficit hyperactivity disorder, combined type: Secondary | ICD-10-CM | POA: Diagnosis not present

## 2024-03-02 DIAGNOSIS — F99 Mental disorder, not otherwise specified: Secondary | ICD-10-CM

## 2024-03-02 DIAGNOSIS — F17200 Nicotine dependence, unspecified, uncomplicated: Secondary | ICD-10-CM

## 2024-03-02 DIAGNOSIS — G2401 Drug induced subacute dyskinesia: Secondary | ICD-10-CM

## 2024-03-02 MED ORDER — DULOXETINE HCL 60 MG PO CPEP: 120.0000 mg | ORAL_CAPSULE | Freq: Every day | ORAL | 1 refills | Status: AC

## 2024-03-02 MED ORDER — ESZOPICLONE 2 MG PO TABS: 2.0000 mg | ORAL_TABLET | Freq: Every evening | ORAL | 5 refills | Status: AC | PRN

## 2024-03-02 NOTE — Progress Notes (Signed)
 [facility]  Patient ID: Matthew [name],  MRN: [id number]  PCP: Jhon Elveria LABOR, MD  Date of Evaluation: 03/02/2024 Time spent:20 minutes  Chief Complaint:  Chief Complaint   Depression; Insomnia; Follow-up    HISTORY/CURRENT STATUS: HPI For routine med check.   Is doing fairly well.  His sister died last month. She had AML and didn't live long after dx. That's been hard.  He lost his Mom last November.   Energy and motivation are mostly good.  He's still trying to quit smoking.  Using nicotine  patches some of the time, and FUM. No feelings of hopelessness.  Sleep is still difficult.  He is having Inspire placed later this month.  ADLs and personal hygiene are normal.  Strattera  is still effective. States that attention is good without easy distractibility.  Able to focus on things and finish tasks to completion.  Appetite has not changed.  Weight is stable.   No mania, delirium, AH/VH.  No SI/HI.  Denies dizziness, syncope, seizures, numbness, tingling, tremor, tics, unsteady gait, slurred speech, confusion. Denies muscle or joint pain, stiffness, or dystonia.  Individual Medical History/ Review of Systems: Changes? :Yes    dx with Peyronies Is getting Inspire device on 03/11/2024.   Past medications for mental health diagnoses include: Risperdal, Zyprexa , Abilify , [name] , Klonopin , Gabapentin , Xanax , Valium, Ativan , Hydroxyzine , Trilafon , Atenolol , Cymbalta , Prozac, Lexapro, Paxil, Lithium , Adderall, Ritalin, Mirtazepine, Sonata  was ineffective.  Austedo  helped some, Strattera , Quelbree caused headaches.  Ingrezza   Allergies: Gluten meal and Lactose intolerance (gi)  Current Medications:  Current Outpatient Medications:    ARIPiprazole  (ABILIFY ) 20 MG tablet, TAKE 1 TABLET BY MOUTH EVERY DAY, Disp: 90 tablet, Rfl: 0   aspirin  EC 81 MG tablet, Take 1 tablet (81 mg total) by mouth daily. Swallow whole., Disp: 30 tablet, Rfl: 0   atomoxetine  (STRATTERA )  100 MG capsule, TAKE 1 CAPSULE BY MOUTH EVERY DAY, Disp: 90 capsule, Rfl: 0   atorvastatin  (LIPITOR ) 80 MG tablet, Take 1 tablet (80 mg total) by mouth daily., Disp: 90 tablet, Rfl: 3   Bempedoic Acid-Ezetimibe  (NEXLIZET ) 180-10 MG TABS, Take 1 tablet by mouth daily., Disp: 90 tablet, Rfl: 0   busPIRone  (BUSPAR ) 15 MG tablet, Take 1 tablet (15 mg total) by mouth daily., Disp: 180 tablet, Rfl: 1   carvedilol  (COREG ) 12.5 MG tablet, Take 3 tablets (37.5 mg total) by mouth 2 (two) times daily., Disp: 540 tablet, Rfl: 3   clopidogrel  (PLAVIX ) 75 MG tablet, Take 1 tablet (75 mg total) by mouth daily., Disp: 90 tablet, Rfl: 1   empagliflozin  (JARDIANCE ) 25 MG TABS tablet, Take 1 tablet (25 mg total) by mouth every morning., Disp: 90 tablet, Rfl: 3   fenofibrate  (TRICOR ) 145 MG tablet, Take 1 tablet (145 mg total) by mouth daily., Disp: 90 tablet, Rfl: 3   gabapentin  (NEURONTIN ) 800 MG tablet, TAKE 1 TABLET BY MOUTH 4 TIMES A DAY WITH AN EXTRA 1/2 TABLET DURING THE DAY AS NEEDED, Disp: 135 tablet, Rfl: 5   hydrOXYzine  (ATARAX ) 50 MG tablet, TAKE 1 TABLET BY MOUTH EVERY 6 HOURS AS NEEDED, Disp: 360 tablet, Rfl: 0   icosapent  Ethyl (VASCEPA ) 1 g capsule, Take 2 capsules (2 g total) by mouth 2 (two) times daily., Disp: 360 capsule, Rfl: 3   insulin  glargine (LANTUS  SOLOSTAR) 100 UNIT/ML Solostar Pen, Inject 20 Units into the skin daily., Disp: 15 mL, Rfl: 4   Insulin  Pen Needle 32G X 4 MM MISC, 1 Device by Does not apply route  daily in the afternoon., Disp: 100 each, Rfl: 3   metFORMIN  (GLUCOPHAGE -XR) 750 MG 24 hr tablet, Take 2 tablets (1,500 mg total) by mouth daily with breakfast., Disp: 180 tablet, Rfl: 3   omeprazole (PRILOSEC) 40 MG capsule, Take 40 mg by mouth daily., Disp: , Rfl:    Semaglutide , 2 MG/DOSE, (OZEMPIC , 2 MG/DOSE,) 8 MG/3ML SOPN, INJECT 2MG  DOSE INTO THE SKIN ONE TIME PER WEEK, Disp: 9 mL, Rfl: 1   tadalafil (CIALIS) 5 MG tablet, Take 5 mg by mouth daily., Disp: , Rfl:    valbenazine   (INGREZZA ) 80 MG capsule, Take 1 capsule (80 mg total) by mouth daily., Disp: 30 capsule, Rfl: 5   DULoxetine  (CYMBALTA ) 60 MG capsule, Take 2 capsules (120 mg total) by mouth daily., Disp: 180 capsule, Rfl: 1   eszopiclone  (LUNESTA ) 2 MG TABS tablet, Take 1 tablet (2 mg total) by mouth at bedtime as needed for sleep. Take immediately before bedtime, Disp: 30 tablet, Rfl: 5   Tavaborole  (KERYDIN ) 5 % SOLN, Apply 1 drop topically daily. Apply 1 drop to the toenail daily. (Patient not taking: Reported on 02/04/2024), Disp: 10 mL, Rfl: 2 Medication Side Effects: none  Family Medical/ Social History: Changes?  Sister died 2024-02-23 from AML.   MENTAL HEALTH EXAM:  There were no vitals taken for this visit.There is no height or weight on file to calculate BMI.  General Appearance: Casual and Well Groomed  Eye Contact:  Good  Speech:  Clear and Coherent and Normal Rate  Volume:  Normal  Mood:  Euthymic  Affect:  Congruent  Thought Process:  Goal Directed and Descriptions of Associations: Circumstantial  Orientation:  Full (Time, Place, and Person)  Thought Content: Logical   Suicidal Thoughts:  No  Homicidal Thoughts:  No  Memory:  WNL  Judgement:  Fair  Insight:  Good  Psychomotor Activity:  Normal  Concentration:  Concentration: Good and Attention Span: Good  Recall:  Good  Fund of Knowledge: Good  Language: Good  Assets:  Communication Skills Desire for Improvement Financial Resources/Insurance Housing Transportation  ADL's:  Intact  Cognition: WNL  Prognosis:  Good         AIMS    Flowsheet Row Office Visit from 06/18/2023 in [facility] Office Visit from 03/18/2023 in [facility] Office Visit from 02[facility]18/2023 in [facility] Office Visit from 05/30/2022 in [facility] Office Visit from 04/20/2022 in [facility]  AIMS Total Score 1 2 8 3 7     PHQ2-9    Flowsheet Row CARDIAC REHAB PHASE II ORIENTATION from 08/16/2020 in [facility]  PHQ-2 Total Score 0   Flowsheet Row Admission (Discharged) from 02/13/2024 in [facility] ENDOSCOPY ED from 09/02/2023 in [facility] Emergency Department at [facility] ED to Hosp-Admission (Discharged) from 06/30/2022 in [facility]  C-SSRS RISK CATEGORY No Risk No Risk No Risk   PCP and cardiology treat abnormal labs.  DIAGNOSES:    ICD-10-CM   1. Generalized anxiety disorder  F41.1     2. Attention deficit hyperactivity disorder (ADHD), combined type, moderate  F90.2     3. Schizoaffective disorder, bipolar type (HCC)  F25.0     4. Insomnia due to other mental disorder  F51.05    F99     5. Grief  F43.21     6.  Smoker  F17.200     7. Tardive dyskinesia  G24.01       Receiving Psychotherapy: No   RECOMMENDATIONS:  PDMP reviewed.  Gabapentin  filled 02/06/2024.  Lunesta  filled 01/16/2024. I provided approximately  20 minutes of face to face time during this encounter, including time spent before and after the visit in records review, medical decision making, counseling pertinent to today's visit, and charting.   My condolences in the loss of his sister.  As far as his medications go he is doing well so no changes will be made.  Continue Abilify  20 mg, 1 p.o. daily. Continue Strattera  100 mg, 1 p.o. daily. Continue Cymbalta  60 mg, 2 p.o. daily. Continue Lunesta  2 mg 1 po at bedtime prn. Continue gabapentin  800 mg 1 p.o. 4 times daily with 1/2 pill mid day.  Continue hydroxyzine  50 mg, 1 p.o. 4 times daily as needed.   Continue Ingrezza  80 mg daily.  Return in 3 months.  Verneita Cooks, PA-C

## 2024-03-05 ENCOUNTER — Other Ambulatory Visit: Payer: Self-pay | Admitting: Otolaryngology

## 2024-03-06 ENCOUNTER — Other Ambulatory Visit: Payer: Self-pay | Admitting: Physician Assistant

## 2024-03-09 ENCOUNTER — Ambulatory Visit: Admitting: Podiatry

## 2024-03-09 DIAGNOSIS — B351 Tinea unguium: Secondary | ICD-10-CM | POA: Diagnosis not present

## 2024-03-09 MED ORDER — TAVABOROLE 5 % EX SOLN: 1.0000 [drp] | Freq: Every day | CUTANEOUS | 2 refills | Status: AC

## 2024-03-09 NOTE — Progress Notes (Unsigned)
 Subjective: Chief Complaint  Patient presents with   Nail Problem    Nail fungus follow up      48 year old male presents after above concerns.  He has been on topical medication which has been helping some.  In regards to the Lamisil he states that he has seen his PCP next week.  He currently denies any pain to the nails and no swelling, redness or any drainage.  Objective: AAO x3, NAD DP/PT pulses palpable bilaterally, CRT less than 3 seconds To the hallux toenails in particular the nails remain hypertrophic, dystrophic with yellow, brown discoloration.  There is some clearing on the proximal nail folds.  There is no edema, erythema or signs infection of the toenail sites.  The nails are still hypertrophic.  No open lesions.  No pain with calf compression, swelling, warmth, erythema  Assessment: Onychomycosis  Plan: -All treatment options discussed with the patient including all alternatives, risks, complications.  -As a courtesy debride the nails that any complications or bleeding.  And continue topical medication for now.  Should his liver function improve consider starting on Lamisil.  If we do this we will likely do monthly blood work.  No follow-ups on file.  Matthew [name] DPM

## 2024-03-09 NOTE — Patient Instructions (Signed)
 Tavaborole  Topical solution What is this medication? TAVABOROLE  (ta va BO role) treats fungal infections of the nails. It belongs to a group of medications called antifungals. It will not treat infections caused by bacteria or viruses. This medicine may be used for other purposes; ask your health care provider or pharmacist if you have questions. COMMON BRAND NAME(S): KERYDIN  What should I tell my care team before I take this medication? They need to know if you have any of these conditions: An unusual or allergic reaction to tavaborole , other medications, foods, dyes, or preservatives Pregnant or trying to get pregnant Breast-feeding How should I use this medication? This medication is for external use only. Do not take by mouth. Wash your hands before and after use. If you are treating your hands, only wash your hands before use. Do not get it in your eyes. If you do, rinse your eyes with plenty of cool tap water. Use it as directed on the prescription label. Do not use it more often than directed. Use the medication for the full course as directed by your care team, even if you think you are better. Do not stop using it unless your care team tells you to stop it early. Apply a thin film of the medication to the affected area. Talk to your care team about the use of this medication in children. While it may be prescribed for children as young as 6 years for selected conditions, precautions do apply. Overdosage: If you think you have taken too much of this medicine contact a poison control center or emergency room at once. NOTE: This medicine is only for you. Do not share this medicine with others. What if I miss a dose? If you miss a dose, use it as soon as you can. If it is almost time for your next dose, use only that dose. Do not use double or extra doses. What may interact with this medication? Interactions have not been studied. Do not use any other nail products (i.e., nail polish,  pedicures) during treatment with this medication. This list may not describe all possible interactions. Give your health care provider a list of all the medicines, herbs, non-prescription drugs, or dietary supplements you use. Also tell them if you smoke, drink alcohol, or use illegal drugs. Some items may interact with your medicine. What should I watch for while using this medication? Visit your care team for regular checks on your progress. It may be some time before you see the benefit from this medication. After bathing, make sure your skin is very dry. Fungal infections like moist conditions. Do not walk around barefoot. To help prevent reinfection, wear freshly washed cotton, not synthetic, clothing. Tell your care team if you develop sores or blisters that do not heal properly. If your skin infection returns after you stop using this medication, contact your care team. What side effects may I notice from receiving this medication? Side effects that you should report to your care team as soon as possible: Allergic reactions--skin rash, itching, hives, swelling of the face, lips, tongue, or throat Burning, itching, crusting, or peeling of treated skin Side effects that usually do not require medical attention (report to your care team if they continue or are bothersome): Ingrown nails Mild skin irritation, redness, or dryness This list may not describe all possible side effects. Call your doctor for medical advice about side effects. You may report side effects to FDA at 1-800-FDA-1088. Where should I keep my medication? Keep out  of the reach of children and pets. Store at room temperature between 20 and 25 degrees C (68 and 77 degrees F). Keep this medication in the original container. Protect from moisture. Keep the container tightly closed. Avoid exposure to extreme heat. Get rid of any unused medication 3 months after opening. This medication is flammable. Avoid exposure to heat, fire,  flame, and smoking. To get rid of medications that are no longer needed or have expired: Take the medications to a medication take-back program. Check with your pharmacy or law enforcement to find a location. If you cannot return the medication, check the label or package insert to see if the medication should be thrown out in the garbage or flushed down the toilet. If you are not sure, ask your care team. If it is safe to put it in the trash, empty the medication out of the container. Mix the medication with cat litter, dirt, coffee grounds, or other unwanted substance. Seal the mixture in a bag or container. Put it in the trash. NOTE: This sheet is a summary. It may not cover all possible information. If you have questions about this medicine, talk to your doctor, pharmacist, or health care provider.  2024 Elsevier/Gold Standard (2021-08-24 00:00:00)

## 2024-03-10 ENCOUNTER — Other Ambulatory Visit: Payer: Self-pay

## 2024-03-10 ENCOUNTER — Encounter (HOSPITAL_COMMUNITY): Payer: Self-pay | Admitting: Otolaryngology

## 2024-03-10 NOTE — Progress Notes (Addendum)
 PCP - Jhon Elveria LABOR, MD  Cardiologist - Shlomo Wilbert SAUNDERS, MD   PPM/ICD - denies Device Orders - n/a Rep Notified - n/a  Chest x-ray - 09-01-20 EKG - 10-04-23 Stress Test - denies ECHO - 04-24-21 Cardiac Cath - 02-24-21  CPAP - denies  GLP-1 -Semaglutide , 2 MG/DOSE, (OZEMPIC , 2 MG/DOSE,) LAST DOSE LAST DOSE 03-02-24  Fasting Blood Sugar - Per patient daily at time and does have a dexacom that he is not using at this time.Per patient blood sugar ranges 100-110   Blood Thinner Instructions: clopidogrel  (PLAVIX ) LAST DOSE: 03-05-24 Aspirin  Instructions:  Per patient cardiology requested that he continue with ASA. Per patient surgeon is aware  ERAS Protcol - clear liquids until 10:00  COVID TEST- n/a  Anesthesia review: Yes, Hx of HTN, CAD, DM  Patient verbally denies any shortness of breath, fever, cough and chest pain during phone call   -------------  SDW INSTRUCTIONS given:  Your procedure is scheduled on March 11, 2024.  Report to [facility] Main Entrance A at 10:30 A.M., and check in at the Admitting office.  Call this number if you have problems the morning of surgery:  [phone]   Remember:  Do not eat after midnight the night before your surgery  You may drink clear liquids until 10:00 the morning of your surgery.   Clear liquids allowed are: Water , Non-Citrus Juices (without pulp), Carbonated Beverages, Clear Tea, Black Coffee Only, and Gatorade    Take these medicines the morning of surgery with A SIP OF WATER   ARIPiprazole  (ABILIFY )  atorvastatin  (LIPITOR )  Bempedoic Acid-Ezetimibe  (NEXLIZET )  busPIRone  (BUSPAR )  carvedilol  (COREG )  DULoxetine  (CYMBALTA )  fenofibrate  (TRICOR )  gabapentin  (NEURONTIN )  hydrOXYzine  (ATARAX )  omeprazole (PRILOSEC)  valbenazine  (INGREZZA )    WHAT DO I DO ABOUT MY DIABETES MEDICATION?   Do not take oral diabetes medicines metFORMIN  (GLUCOPHAGE -XR (pills) the morning of surgery.  empagliflozin  (JARDIANCE ) -LAST  DOSE: 03-07-24         THE MORNING OF SURGERY, take 10 units of LANTU insulin .  The day of surgery, do not take other diabetes injectables, including Byetta (exenatide), Bydureon (exenatide ER), Victoza (liraglutide), or Trulicity (dulaglutide).  If your CBG is greater than 220 mg/dL, you may take  of your sliding scale (correction) dose of insulin .   HOW TO MANAGE YOUR DIABETES BEFORE AND AFTER SURGERY  Why is it important to control my blood sugar before and after surgery? Improving blood sugar levels before and after surgery helps healing and can limit problems. A way of improving blood sugar control is eating a healthy diet by:  Eating less sugar and carbohydrates  Increasing activity/exercise  Talking with your doctor about reaching your blood sugar goals High blood sugars (greater than 180 mg/dL) can raise your risk of infections and slow your recovery, so you will need to focus on controlling your diabetes during the weeks before surgery. Make sure that the doctor who takes care of your diabetes knows about your planned surgery including the date and location.  How do I manage my blood sugar before surgery? Check your blood sugar at least 4 times a day, starting 2 days before surgery, to make sure that the level is not too high or low.  Check your blood sugar the morning of your surgery when you wake up and every 2 hours until you get to the Short Stay unit.  If your blood sugar is less than 70 mg/dL, you will need to treat for low blood sugar: Do  not take insulin . Treat a low blood sugar (less than 70 mg/dL) with  cup of clear juice (cranberry or apple), 4 glucose tablets, OR glucose gel. Recheck blood sugar in 15 minutes after treatment (to make sure it is greater than 70 mg/dL). If your blood sugar is not greater than 70 mg/dL on recheck, call [phone] for further instructions. Report your blood sugar to the short stay nurse when you get to Short Stay.  If you are  admitted to the hospital after surgery: Your blood sugar will be checked by the staff and you will probably be given insulin  after surgery (instead of oral diabetes medicines) to make sure you have good blood sugar levels. The goal for blood sugar control after surgery is 80-180 mg/dL.   As of today, STOP taking any Aspirin  (unless otherwise instructed by your surgeon) Aleve, Naproxen, Ibuprofen , Motrin , Advil , Goody's, BC's, all herbal medications, fish oil, and all vitamins.                      Do not wear jewelry, make up, or nail polish            Do not wear lotions, powders, perfumes/colognes, or deodorant.            Do not shave 48 hours prior to surgery.  Men may shave face and neck.            Do not bring valuables to the hospital.            [facility] is not responsible for any belongings or valuables.  Do NOT Smoke (Tobacco/Vaping) 24 hours prior to your procedure If you use a CPAP at night, you may bring all equipment for your overnight stay.   Contacts, glasses, dentures or bridgework may not be worn into surgery.      For patients admitted to the hospital, discharge time will be determined by your treatment team.   Patients discharged the day of surgery will not be allowed to drive home, and someone needs to stay with them for 24 hours.    Special instructions:   [facility]- Preparing For Surgery  Before surgery, you can play an important role. Because skin is not sterile, your skin needs to be as free of germs as possible. You can reduce the number of germs on your skin by washing with CHG (chlorahexidine gluconate) Soap before surgery.  CHG is an antiseptic cleaner which kills germs and bonds with the skin to continue killing germs even after washing.    Oral Hygiene is also important to reduce your risk of infection.  Remember - BRUSH YOUR TEETH THE MORNING OF SURGERY WITH YOUR REGULAR TOOTHPASTE  Please do not use if you have an allergy to CHG or antibacterial  soaps. If your skin becomes reddened/irritated stop using the CHG.  Do not shave (including legs and underarms) for at least 48 hours prior to first CHG shower. It is OK to shave your face.  Please follow these instructions carefully.   Shower the NIGHT BEFORE SURGERY and the MORNING OF SURGERY with DIAL Soap.   Pat yourself dry with a CLEAN TOWEL.  Wear CLEAN PAJAMAS to bed the night before surgery  Place CLEAN SHEETS on your bed the night of your first shower and DO NOT SLEEP WITH PETS.   Day of Surgery: Please shower morning of surgery  Wear Clean/Comfortable clothing the morning of surgery Do not apply any deodorants/lotions.   Remember to  brush your teeth WITH YOUR REGULAR TOOTHPASTE.   Questions were answered. Patient verbalized understanding of instructions.

## 2024-03-10 NOTE — Anesthesia Preprocedure Evaluation (Signed)
 Anesthesia Evaluation  Patient identified by MRN, date of birth, ID band Patient awake    Reviewed: Allergy & Precautions, NPO status , Patient's Chart, lab work & pertinent test results, reviewed documented beta blocker date and time   Airway Mallampati: II  TM Distance: >3 FB Neck ROM: Full    Dental  (+) Chipped, Dental Advisory Given,    Pulmonary sleep apnea , Current Smoker and Patient abstained from smoking.   Pulmonary exam normal breath sounds clear to auscultation       Cardiovascular hypertension, Pt. on home beta blockers and Pt. on medications + angina  + CAD, + Past MI and + Cardiac Stents  Normal cardiovascular exam Rhythm:Regular Rate:Normal     Neuro/Psych  PSYCHIATRIC DISORDERS Anxiety Depression Bipolar Disorder Schizophrenia  negative neurological ROS     GI/Hepatic negative GI ROS, Neg liver ROS,,,  Endo/Other  diabetes, Type 2, Oral Hypoglycemic Agents, Insulin  DependentHypothyroidism    Renal/GU negative Renal ROS  negative genitourinary   Musculoskeletal  (+) Arthritis ,    Abdominal   Peds  (+) ADHD Hematology  (+) Blood dyscrasia (plavix )   Anesthesia Other Findings   Reproductive/Obstetrics                              Anesthesia Physical Anesthesia Plan  ASA: 3  Anesthesia Plan: General   Post-op Pain Management: Tylenol  PO (pre-op)* and Precedex    Induction: Intravenous  PONV Risk Score and Plan: 1 and Midazolam  and Ondansetron   Airway Management Planned: Oral ETT  Additional Equipment:   Intra-op Plan:   Post-operative Plan: Extubation in OR  Informed Consent: I have reviewed the patients History and Physical, chart, labs and discussed the procedure including the risks, benefits and alternatives for the proposed anesthesia with the patient or authorized representative who has indicated his/her understanding and acceptance.     Dental advisory  given  Plan Discussed with: CRNA  Anesthesia Plan Comments: (PAT note written 03/10/2024 by Desirae Mancusi, PA-C.  )         Anesthesia Quick Evaluation

## 2024-03-10 NOTE — Progress Notes (Addendum)
 Anesthesia Chart Review: SAME DAY WORK-UP  Case: [id number] Date/Time: 03/11/24 1248   Procedure: INSERTION, HYPOGLOSSAL NERVE STIMULATOR (Bilateral)   Anesthesia type: General   Diagnosis: Obstructive sleep apnea [G47.33]   Pre-op diagnosis: OBSTRUCTIVE SLEEP APNEA   Location: [facility] OR ROOM 10 / [facility] OR   Surgeons: Carlie Clark, MD       DISCUSSION: See my previous note from Date of Service 02/05/2024. He is s/p drug induced sleep endoscopy on 02/13/2024 and is now for insertion of hypoglossal nerve stimulator implant for management of his OSA.    He had previous cardiology preoperative input prior to sleep endoscopy. He was given permission it hold Plavix  for 5 days.   Per Progress Note by Dr. Carlie dated 03/04/2024, He has been informed to discontinue Plavix  5 days prior to the procedure, Jardiance  3 days before, and Ozempic  1 week in advance. He has both CHF and DM2.  - He reported last dose of Ozempic  03/02/2024; Last Plavix  03/05/2024; Last Jardiance  03/07/2024. He also reported that cardiology had recommended he continue ASA 81 mg, and that he discussed this with Dr. Carlie.  He is for updated labs as indicated and anesthesia team evaluation on the day of surgery.    Isaiah Ruder, PA-C Surgical Short Stay/Anesthesiology [facility] Phone [phone] [facility] Phone [phone] 03/10/2024 10:27 AM

## 2024-03-11 ENCOUNTER — Encounter (HOSPITAL_COMMUNITY): Admission: RE | Disposition: A | Payer: Self-pay | Source: Home / Self Care | Attending: Otolaryngology

## 2024-03-11 ENCOUNTER — Ambulatory Visit (HOSPITAL_COMMUNITY): Payer: Self-pay | Admitting: Vascular Surgery

## 2024-03-11 ENCOUNTER — Ambulatory Visit (HOSPITAL_BASED_OUTPATIENT_CLINIC_OR_DEPARTMENT_OTHER): Payer: Self-pay | Admitting: Vascular Surgery

## 2024-03-11 ENCOUNTER — Ambulatory Visit (HOSPITAL_COMMUNITY)

## 2024-03-11 ENCOUNTER — Ambulatory Visit (HOSPITAL_COMMUNITY)
Admission: RE | Admit: 2024-03-11 | Discharge: 2024-03-11 | Disposition: A | Discharge status: Home / Self Care | Attending: Otolaryngology | Admitting: Otolaryngology

## 2024-03-11 DIAGNOSIS — Z7985 Long-term (current) use of injectable non-insulin antidiabetic drugs: Secondary | ICD-10-CM | POA: Insufficient documentation

## 2024-03-11 DIAGNOSIS — Z955 Presence of coronary angioplasty implant and graft: Secondary | ICD-10-CM | POA: Diagnosis not present

## 2024-03-11 DIAGNOSIS — Z7982 Long term (current) use of aspirin: Secondary | ICD-10-CM | POA: Insufficient documentation

## 2024-03-11 DIAGNOSIS — F319 Bipolar disorder, unspecified: Secondary | ICD-10-CM | POA: Diagnosis not present

## 2024-03-11 DIAGNOSIS — E66811 Obesity, class 1: Secondary | ICD-10-CM | POA: Insufficient documentation

## 2024-03-11 DIAGNOSIS — E119 Type 2 diabetes mellitus without complications: Secondary | ICD-10-CM | POA: Diagnosis not present

## 2024-03-11 DIAGNOSIS — I25119 Atherosclerotic heart disease of native coronary artery with unspecified angina pectoris: Secondary | ICD-10-CM | POA: Diagnosis not present

## 2024-03-11 DIAGNOSIS — F419 Anxiety disorder, unspecified: Secondary | ICD-10-CM | POA: Diagnosis not present

## 2024-03-11 DIAGNOSIS — I10 Essential (primary) hypertension: Secondary | ICD-10-CM | POA: Diagnosis not present

## 2024-03-11 DIAGNOSIS — G4733 Obstructive sleep apnea (adult) (pediatric): Secondary | ICD-10-CM | POA: Diagnosis present

## 2024-03-11 DIAGNOSIS — Z7902 Long term (current) use of antithrombotics/antiplatelets: Secondary | ICD-10-CM | POA: Diagnosis not present

## 2024-03-11 DIAGNOSIS — F17210 Nicotine dependence, cigarettes, uncomplicated: Secondary | ICD-10-CM

## 2024-03-11 DIAGNOSIS — Z7984 Long term (current) use of oral hypoglycemic drugs: Secondary | ICD-10-CM | POA: Insufficient documentation

## 2024-03-11 DIAGNOSIS — Z794 Long term (current) use of insulin: Secondary | ICD-10-CM | POA: Insufficient documentation

## 2024-03-11 DIAGNOSIS — Z6833 Body mass index (BMI) 33.0-33.9, adult: Secondary | ICD-10-CM | POA: Diagnosis not present

## 2024-03-11 DIAGNOSIS — D759 Disease of blood and blood-forming organs, unspecified: Secondary | ICD-10-CM | POA: Diagnosis not present

## 2024-03-11 DIAGNOSIS — I252 Old myocardial infarction: Secondary | ICD-10-CM | POA: Insufficient documentation

## 2024-03-11 DIAGNOSIS — Z789 Other specified health status: Secondary | ICD-10-CM | POA: Diagnosis not present

## 2024-03-11 DIAGNOSIS — Z79899 Other long term (current) drug therapy: Secondary | ICD-10-CM | POA: Diagnosis not present

## 2024-03-11 DIAGNOSIS — I2510 Atherosclerotic heart disease of native coronary artery without angina pectoris: Secondary | ICD-10-CM

## 2024-03-11 HISTORY — DX: Other specified abnormal findings of blood chemistry: R79.89

## 2024-03-11 HISTORY — PX: IMPLANTATION OF HYPOGLOSSAL NERVE STIMULATOR: SHX6827

## 2024-03-11 LAB — BASIC METABOLIC PANEL WITH GFR
Anion gap: 15 (ref 5–15)
BUN: 15 mg/dL (ref 6–20)
CO2: 21 mmol/L — ABNORMAL LOW (ref 22–32)
Calcium: 9.5 mg/dL (ref 8.9–10.3)
Chloride: 102 mmol/L (ref 98–111)
Creatinine, Ser: 1.02 mg/dL (ref 0.61–1.24)
GFR, Estimated: >60 mL/min (ref >60)
Glucose, Bld: 104 mg/dL — ABNORMAL HIGH (ref 70–99)
Potassium: 4 mmol/L (ref 3.5–5.1)
Sodium: 138 mmol/L (ref 135–145)

## 2024-03-11 LAB — GLUCOSE, CAPILLARY
Glucose-Capillary: 111 mg/dL — ABNORMAL HIGH (ref 70–99)
Glucose-Capillary: 105 mg/dL — ABNORMAL HIGH (ref 70–99)
Glucose-Capillary: 98 mg/dL (ref 70–99)
Glucose-Capillary: 121 mg/dL — ABNORMAL HIGH (ref 70–99)

## 2024-03-11 LAB — CBC
HCT: 46.2 % (ref 39.0–52.0)
Hemoglobin: 15.3 g/dL (ref 13.0–17.0)
MCH: 28.9 pg (ref 26.0–34.0)
MCHC: 33.1 g/dL (ref 30.0–36.0)
MCV: 87.3 fL (ref 80.0–100.0)
Platelets: 258 K/uL (ref 150–400)
RBC: 5.29 MIL/uL (ref 4.22–5.81)
RDW: 13.9 % (ref 11.5–15.5)
WBC: 9.6 K/uL (ref 4.0–10.5)
nRBC: 0 % (ref 0.0–0.2)

## 2024-03-11 SURGERY — INSERTION, HYPOGLOSSAL NERVE STIMULATOR
Anesthesia: General | Site: Chest | Laterality: Right

## 2024-03-11 MED ORDER — ACETAMINOPHEN 500 MG PO TABS
1000.0000 mg | ORAL_TABLET | Freq: Once | ORAL | Status: AC
Start: 2024-03-11 — End: 2024-03-11
  Administered 2024-03-11: 1000 mg via ORAL
  Filled 2024-03-11: qty 2

## 2024-03-11 MED ORDER — ROCURONIUM BROMIDE 10 MG/ML (PF) SYRINGE
PREFILLED_SYRINGE | INTRAVENOUS | Status: DC | PRN
Start: 2024-03-11 — End: 2024-03-11
  Administered 2024-03-11: 10 mg via INTRAVENOUS

## 2024-03-11 MED ORDER — ORAL CARE MOUTH RINSE: 15.0000 mL | Freq: Once | OROMUCOSAL | Status: AC

## 2024-03-11 MED ORDER — MIDAZOLAM HCL (PF) 2 MG/2ML IJ SOLN
INTRAMUSCULAR | Status: DC | PRN
  Administered 2024-03-11: 2 mg via INTRAVENOUS

## 2024-03-11 MED ORDER — PROPOFOL 10 MG/ML IV BOLUS
INTRAVENOUS | Status: AC
  Filled 2024-03-11: qty 20

## 2024-03-11 MED ORDER — SUGAMMADEX SODIUM 200 MG/2ML IV SOLN
INTRAVENOUS | Status: DC | PRN
  Administered 2024-03-11: 100 mg via INTRAVENOUS

## 2024-03-11 MED ORDER — HYDROCODONE-ACETAMINOPHEN 5-325 MG PO TABS: 1.0000 | ORAL_TABLET | Freq: Four times a day (QID) | ORAL | 0 refills | Status: DC | PRN

## 2024-03-11 MED ORDER — EPHEDRINE 5 MG/ML INJ
INTRAVENOUS | Status: AC
Start: 2024-03-11 — End: 2024-03-11
  Filled 2024-03-11: qty 5

## 2024-03-11 MED ORDER — PROPOFOL 500 MG/50ML IV EMUL
INTRAVENOUS | Status: DC | PRN
  Administered 2024-03-11: 50 ug/kg/min via INTRAVENOUS

## 2024-03-11 MED ORDER — DEXAMETHASONE SOD PHOSPHATE PF 10 MG/ML IJ SOLN
INTRAMUSCULAR | Status: DC | PRN
  Administered 2024-03-11: 10 mg via INTRAVENOUS

## 2024-03-11 MED ORDER — EPHEDRINE SULFATE-NACL 50-0.9 MG/10ML-% IV SOSY
PREFILLED_SYRINGE | INTRAVENOUS | Status: DC | PRN
  Administered 2024-03-11: 10 mg via INTRAVENOUS
  Administered 2024-03-11 (×2): 5 mg via INTRAVENOUS

## 2024-03-11 MED ORDER — PROPOFOL 10 MG/ML IV BOLUS
INTRAVENOUS | Status: DC | PRN
  Administered 2024-03-11: 150 mg via INTRAVENOUS

## 2024-03-11 MED ORDER — LIDOCAINE-EPINEPHRINE 1 %-1:100000 IJ SOLN
INTRAMUSCULAR | Status: DC | PRN
  Administered 2024-03-11: 5 mL

## 2024-03-11 MED ORDER — FENTANYL CITRATE (PF) 250 MCG/5ML IJ SOLN
INTRAMUSCULAR | Status: AC
  Filled 2024-03-11: qty 5

## 2024-03-11 MED ORDER — PHENYLEPHRINE 80 MCG/ML (10ML) SYRINGE FOR IV PUSH (FOR BLOOD PRESSURE SUPPORT)
PREFILLED_SYRINGE | INTRAVENOUS | Status: DC | PRN
  Administered 2024-03-11: 100 ug via INTRAVENOUS
  Administered 2024-03-11: 200 ug via INTRAVENOUS
  Administered 2024-03-11 (×7): 100 ug via INTRAVENOUS

## 2024-03-11 MED ORDER — CHLORHEXIDINE GLUCONATE 0.12 % MT SOLN
15.0000 mL | Freq: Once | OROMUCOSAL | Status: AC
  Administered 2024-03-11: 15 mL via OROMUCOSAL
  Filled 2024-03-11: qty 15

## 2024-03-11 MED ORDER — PHENYLEPHRINE 80 MCG/ML (10ML) SYRINGE FOR IV PUSH (FOR BLOOD PRESSURE SUPPORT)
PREFILLED_SYRINGE | INTRAVENOUS | Status: AC
Start: 2024-03-11 — End: 2024-03-11
  Filled 2024-03-11: qty 10

## 2024-03-11 MED ORDER — SUCCINYLCHOLINE CHLORIDE 200 MG/10ML IV SOSY
PREFILLED_SYRINGE | INTRAVENOUS | Status: DC | PRN
  Administered 2024-03-11: 200 mg via INTRAVENOUS

## 2024-03-11 MED ORDER — PHENYLEPHRINE HCL-NACL 20-0.9 MG/250ML-% IV SOLN
INTRAVENOUS | Status: DC | PRN
  Administered 2024-03-11: 25 ug/min via INTRAVENOUS

## 2024-03-11 MED ORDER — STERILE WATER FOR IRRIGATION IR SOLN
Status: DC | PRN
  Administered 2024-03-11: 1000 mL

## 2024-03-11 MED ORDER — MIDAZOLAM HCL 2 MG/2ML IJ SOLN
INTRAMUSCULAR | Status: AC
  Filled 2024-03-11: qty 2

## 2024-03-11 MED ORDER — INSULIN ASPART 100 UNIT/ML IJ SOLN: 0.0000 [IU] | INTRAMUSCULAR | Status: DC | PRN

## 2024-03-11 MED ORDER — ONDANSETRON HCL 4 MG/2ML IJ SOLN
INTRAMUSCULAR | Status: DC | PRN
  Administered 2024-03-11: 4 mg via INTRAVENOUS

## 2024-03-11 MED ORDER — CEFAZOLIN SODIUM-DEXTROSE 3-4 GM/150ML-% IV SOLN
3.0000 g | INTRAVENOUS | Status: AC
Start: 2024-03-11 — End: 2024-03-11
  Administered 2024-03-11: 3 g via INTRAVENOUS
  Filled 2024-03-11: qty 150

## 2024-03-11 MED ORDER — FENTANYL CITRATE (PF) 250 MCG/5ML IJ SOLN
INTRAMUSCULAR | Status: DC | PRN
  Administered 2024-03-11: 100 ug via INTRAVENOUS

## 2024-03-11 MED ORDER — LIDOCAINE-EPINEPHRINE 1 %-1:100000 IJ SOLN
INTRAMUSCULAR | Status: AC
  Filled 2024-03-11: qty 1

## 2024-03-11 MED ORDER — 0.9 % SODIUM CHLORIDE (POUR BTL) OPTIME
TOPICAL | Status: DC | PRN
  Administered 2024-03-11: 1000 mL

## 2024-03-11 MED ORDER — LIDOCAINE 2% (20 MG/ML) 5 ML SYRINGE
INTRAMUSCULAR | Status: DC | PRN
  Administered 2024-03-11: 100 mg via INTRAVENOUS

## 2024-03-11 MED ORDER — SODIUM CHLORIDE 0.9 % IV SOLN
0.1500 ug/kg/min | INTRAVENOUS | Status: AC
  Administered 2024-03-11: .2 ug/kg/min via INTRAVENOUS
  Filled 2024-03-11: qty 2000

## 2024-03-11 MED ORDER — LACTATED RINGERS IV SOLN: INTRAVENOUS | Status: DC

## 2024-03-11 SURGICAL SUPPLY — 50 items
BAG COUNTER SPONGE SURGICOUNT (BAG) ×1 IMPLANT
BLADE CLIPPER SURG (BLADE) IMPLANT
BLADE SURG 15 STRL LF DISP TIS (BLADE) ×1 IMPLANT
CORD BIPOLAR FORCEPS 12FT (ELECTRODE) ×1 IMPLANT
COVER PROBE W GEL 5X96 (DRAPES) ×1 IMPLANT
COVER SURGICAL LIGHT HANDLE (MISCELLANEOUS) ×1 IMPLANT
DERMABOND ADVANCED .7 DNX12 (GAUZE/BANDAGES/DRESSINGS) ×1 IMPLANT
DRAPE C-ARM 35X43 STRL (DRAPES) ×1 IMPLANT
DRAPE HEAD BAR (DRAPES) ×1 IMPLANT
DRAPE INCISE IOBAN 66X45 STRL (DRAPES) ×1 IMPLANT
DRAPE MICROSCOPE LEICA 54X105 (DRAPES) ×1 IMPLANT
DRAPE UTILITY XL STRL (DRAPES) ×1 IMPLANT
DRSG TEGADERM 4X4.75 (GAUZE/BANDAGES/DRESSINGS) ×3 IMPLANT
ELECT COATED BLADE 2.86 ST (ELECTRODE) ×1 IMPLANT
ELECTRODE EMG 18 NIMS (NEUROSURGERY SUPPLIES) ×1 IMPLANT
ELECTRODE REM PT RTRN 9FT ADLT (ELECTROSURGICAL) ×1 IMPLANT
FORCEPS BIPOLAR SPETZLER 8 1.0 (NEUROSURGERY SUPPLIES) ×1 IMPLANT
GAUZE 4X4 16PLY ~~LOC~~+RFID DBL (SPONGE) ×1 IMPLANT
GAUZE SPONGE 4X4 12PLY STRL (GAUZE/BANDAGES/DRESSINGS) ×1 IMPLANT
GENERATOR PULSE INSPIRE V SENS (Generator) ×1 IMPLANT
GLOVE BIO SURGEON STRL SZ7.5 (GLOVE) ×1 IMPLANT
GOWN STRL REUS W/ TWL LRG LVL3 (GOWN DISPOSABLE) ×3 IMPLANT
KIT BASIN OR (CUSTOM PROCEDURE TRAY) ×1 IMPLANT
KIT NEURO ACCESSORY W/WRENCH (MISCELLANEOUS) IMPLANT
KIT TURNOVER KIT B (KITS) ×1 IMPLANT
LEAD SLEEP STIM INSPIRE IV/V (Lead) ×1 IMPLANT
LOOP VASCLR MAXI BLUE 18IN ST (MISCELLANEOUS) ×1 IMPLANT
MARKER SKIN DUAL TIP RULER LAB (MISCELLANEOUS) ×2 IMPLANT
NDL HYPO 25GX1X1/2 BEV (NEEDLE) ×1 IMPLANT
NEEDLE HYPO 25GX1X1/2 BEV (NEEDLE) ×1 IMPLANT
PAD ARMBOARD POSITIONER FOAM (MISCELLANEOUS) ×1 IMPLANT
PASSER CATH 38CM DISP (INSTRUMENTS) ×1 IMPLANT
PENCIL SMOKE EVACUATOR (MISCELLANEOUS) ×1 IMPLANT
POSITIONER HEAD DONUT 9IN (MISCELLANEOUS) ×1 IMPLANT
PROBE NERVE STIMULATOR (NEUROSURGERY SUPPLIES) ×1 IMPLANT
REMOTE CONTROL SLEEP INSPIRE (MISCELLANEOUS) ×1 IMPLANT
SET WALTER ACTIVATION W/DRAPE (SET/KITS/TRAYS/PACK) ×1 IMPLANT
SOLN 0.9% NACL POUR BTL 1000ML (IV SOLUTION) ×1 IMPLANT
SPONGE INTESTINAL PEANUT (DISPOSABLE) ×1 IMPLANT
SUT SILK 2 0 SH (SUTURE) ×1 IMPLANT
SUT SILK 3 0 REEL (SUTURE) ×1 IMPLANT
SUT SILK 3 0 SH 30 (SUTURE) ×2 IMPLANT
SUT VIC AB 3-0 SH 27X BRD (SUTURE) ×2 IMPLANT
SUT VIC AB 4-0 PS2 27 (SUTURE) ×2 IMPLANT
SUTURE SILK 3-0 RB1 30XBRD (SUTURE) ×1 IMPLANT
SYR 10ML LL (SYRINGE) ×1 IMPLANT
TAPE CLOTH SURG 4X10 WHT LF (GAUZE/BANDAGES/DRESSINGS) ×1 IMPLANT
TOWEL GREEN STERILE (TOWEL DISPOSABLE) ×1 IMPLANT
TRAY ENT MC OR (CUSTOM PROCEDURE TRAY) ×1 IMPLANT
VASCULAR TIE MINI RED 18IN STL (MISCELLANEOUS) ×1 IMPLANT

## 2024-03-11 NOTE — Progress Notes (Signed)
 ATTENTION: Per Dr. Carlie this is the new hypoglossal nerve stimulator procedure and for metric purposes the length of the procedure should not be compared with the length of the old, prior hypoglossal nerve stimulator procedures.

## 2024-03-11 NOTE — Transfer of Care (Signed)
 Immediate Anesthesia Transfer of Care Note  Patient: DONNOVAN STAMOUR  Procedure(s) Performed: INSERTION, HYPOGLOSSAL NERVE STIMULATOR (Right: Chest)  Patient Location: PACU  Anesthesia Type:General  Level of Consciousness: awake, alert , oriented, drowsy, and patient cooperative  Airway & Oxygen Therapy: Patient Spontanous Breathing and Patient connected to face mask oxygen  Post-op Assessment: Report given to RN and Post -op Vital signs reviewed and stable  Post vital signs: Reviewed and stable  Last Vitals:  Vitals Value Taken Time  BP 139/83 03/11/24 16:12  Temp    Pulse 103 03/11/24 16:15  Resp 16 03/11/24 16:15  SpO2 90 % 03/11/24 16:15  Vitals shown include unfiled device data.  Last Pain:  Vitals:   03/11/24 1201  TempSrc:   PainSc: 0-No pain         Complications: No notable events documented.

## 2024-03-11 NOTE — H&P (Signed)
 Matthew [name] is an 48 y.o. male.   Chief Complaint: Sleep apnea HPI: 48 year old male with sleep apnea who has been unable to tolerate CPAP.  Past Medical History:  Diagnosis Date   ADHD (attention deficit hyperactivity disorder)    Anxiety    Arthritis    Celiac disease    Coronary artery disease    Dairy allergy    Depression    Diabetes mellitus without complication (HCC)    type 2   Elevated LFTs    Elevated liver enzymes    High cholesterol    Hypertension    Hypothyroidism    Myocardial infarction (HCC)    OSA (obstructive sleep apnea)    Paranoia (HCC)    Schizoaffective disorder (HCC)    and bipolar    Past Surgical History:  Procedure Laterality Date   APPENDECTOMY     CARDIAC CATHETERIZATION     COLONOSCOPY     CORONARY STENT INTERVENTION N/A 06/06/2020   Procedure: CORONARY STENT INTERVENTION;  Surgeon: Swaziland, Peter M, MD;  Location: [facility] INVASIVE CV LAB;  Service: Cardiovascular;  Laterality: N/A;  prox LAD, distal RCA   CORONARY ULTRASOUND/IVUS N/A 06/06/2020   Procedure: Intravascular Ultrasound/IVUS;  Surgeon: Swaziland, Peter M, MD;  Location[facility] INVASIVE CV LAB;  Service: Cardiovascular;  Laterality: N/A;   DRUG INDUCED ENDOSCOPY Bilateral 02/13/2024   Procedure: DRUG INDUCED SLEEP ENDOSCOPY;  Surgeon: Carlie Clark, MD;  Location: [facility] ENDOSCOPY;  Service: ENT;  Laterality: Bilateral;   LEFT HEART CATH AND CORONARY ANGIOGRAPHY N/A 06/06/2020   Procedure: LEFT HEART CATH AND CORONARY ANGIOGRAPHY;  Surgeon: Swaziland, Peter M, MD;  Location: [facility] INVASIVE CV LAB;  Service: Cardiovascular;  Laterality: N/A;   LEFT HEART CATH AND CORONARY ANGIOGRAPHY N/A 02/24/2021   Procedure: LEFT HEART CATH AND CORONARY ANGIOGRAPHY;  Surgeon: Claudene Victory ORN, MD;  Location: [facility] INVASIVE CV LAB;  Service: Cardiovascular;  Laterality: N/A;   NASAL SEPTUM SURGERY     TOTAL SHOULDER ARTHROPLASTY Right 11/28/2021   Procedure: TOTAL SHOULDER ARTHROPLASTY;  Surgeon: Josefina Chew, MD;   Location: [facility] ORS;  Service: Orthopedics;  Laterality: Right;   UPPER GI ENDOSCOPY     WRIST FRACTURE SURGERY     right wrist    Family History  Problem Relation Age of Onset   Asthma Mother    Hypertension Mother    CVA Mother    Hypertension Father    Leukemia Sister        Died 02-09-24   Social History:  reports that he has been smoking cigarettes. He has a 1 pack-year smoking history. He has never used smokeless tobacco. He reports that he does not drink alcohol and does not use drugs.  Allergies:  Allergies  Allergen Reactions   Gluten Meal Other (See Comments)    Celiac disease   Lactose Intolerance (Gi) Diarrhea    Can tolerate hard cheese    Medications Prior to Admission  Medication Sig Dispense Refill   ARIPiprazole  (ABILIFY ) 20 MG tablet TAKE 1 TABLET BY MOUTH EVERY DAY 90 tablet 0   aspirin  EC 81 MG tablet Take 1 tablet (81 mg total) by mouth daily. Swallow whole. 30 tablet 0   atomoxetine  (STRATTERA ) 100 MG capsule TAKE 1 CAPSULE BY MOUTH EVERY DAY 90 capsule 0   atorvastatin  (LIPITOR ) 80 MG tablet Take 1 tablet (80 mg total) by mouth daily. 90 tablet 3   Bempedoic Acid-Ezetimibe  (NEXLIZET ) 180-10 MG TABS Take 1 tablet by mouth daily. 90 tablet  0   busPIRone  (BUSPAR ) 15 MG tablet Take 1 tablet (15 mg total) by mouth daily. 180 tablet 1   carvedilol  (COREG ) 12.5 MG tablet Take 3 tablets (37.5 mg total) by mouth 2 (two) times daily. 540 tablet 3   clopidogrel  (PLAVIX ) 75 MG tablet Take 1 tablet (75 mg total) by mouth daily. 90 tablet 1   DULoxetine  (CYMBALTA ) 60 MG capsule Take 2 capsules (120 mg total) by mouth daily. 180 capsule 1   empagliflozin  (JARDIANCE ) 25 MG TABS tablet Take 1 tablet (25 mg total) by mouth every morning. 90 tablet 3   eszopiclone  (LUNESTA ) 2 MG TABS tablet Take 1 tablet (2 mg total) by mouth at bedtime as needed for sleep. Take immediately before bedtime 30 tablet 5   fenofibrate  (TRICOR ) 145 MG tablet Take 1 tablet (145 mg total) by mouth  daily. 90 tablet 3   gabapentin  (NEURONTIN ) 800 MG tablet TAKE 1 TABLET BY MOUTH 4 TIMES A DAY WITH AN EXTRA 1/2 TABLET DURING THE DAY AS NEEDED 135 tablet 5   hydrOXYzine  (ATARAX ) 50 MG tablet TAKE 1 TABLET BY MOUTH EVERY 6 HOURS AS NEEDED 360 tablet 0   icosapent  Ethyl (VASCEPA ) 1 g capsule Take 2 capsules (2 g total) by mouth 2 (two) times daily. 360 capsule 3   insulin  glargine (LANTUS  SOLOSTAR) 100 UNIT/ML Solostar Pen Inject 20 Units into the skin daily. 15 mL 4   metFORMIN  (GLUCOPHAGE -XR) 750 MG 24 hr tablet Take 2 tablets (1,500 mg total) by mouth daily with breakfast. 180 tablet 3   omeprazole (PRILOSEC) 40 MG capsule Take 40 mg by mouth daily.     Semaglutide , 2 MG/DOSE, (OZEMPIC , 2 MG/DOSE,) 8 MG/3ML SOPN INJECT 2MG  DOSE INTO THE SKIN ONE TIME PER WEEK 9 mL 1   tadalafil (CIALIS) 5 MG tablet Take 5 mg by mouth daily.     Tavaborole  (KERYDIN ) 5 % SOLN Apply 1 drop topically daily. Apply 1 drop to the toenail daily. 10 mL 2   valbenazine  (INGREZZA ) 80 MG capsule Take 1 capsule (80 mg total) by mouth daily. 30 capsule 5   Insulin  Pen Needle 32G X 4 MM MISC 1 Device by Does not apply route daily in the afternoon. 100 each 3    Results for orders placed or performed during the hospital encounter of 03/11/24 (from the past 48 hours)  Glucose, capillary     Status: Abnormal   Collection Time: 03/11/24 10:47 AM  Result Value Ref Range   Glucose-Capillary 111 (H) 70 - 99 mg/dL    Comment: Glucose reference range applies only to samples taken after fasting for at least 8 hours.  CBC per protocol     Status: None   Collection Time: 03/11/24 11:20 AM  Result Value Ref Range   WBC 9.6 4.0 - 10.5 K/uL   RBC 5.29 4.22 - 5.81 MIL/uL   Hemoglobin 15.3 13.0 - 17.0 g/dL   HCT 53.7 60.9 - 47.9 %   MCV 87.3 80.0 - 100.0 fL   MCH 28.9 26.0 - 34.0 pg   MCHC 33.1 30.0 - 36.0 g/dL   RDW 86.0 88.4 - 84.4 %   Platelets 258 150 - 400 K/uL   nRBC 0.0 0.0 - 0.2 %    Comment: Performed at [facility], [address], [place], [place] [address]  Basic metabolic panel per protocol     Status: Abnormal   Collection Time: 03/11/24 11:20 AM  Result Value Ref Range   Sodium  138 135 - 145 mmol/L   Potassium 4.0 3.5 - 5.1 mmol/L   Chloride 102 98 - 111 mmol/L   CO2 21 (L) 22 - 32 mmol/L   Glucose, Bld 104 (H) 70 - 99 mg/dL    Comment: Glucose reference range applies only to samples taken after fasting for at least 8 hours.   BUN 15 6 - 20 mg/dL   Creatinine, Ser 8.97 0.61 - 1.24 mg/dL   Calcium  9.5 8.9 - 10.3 mg/dL   GFR, Estimated >39 >39 mL/min    Comment: (NOTE) Calculated using the CKD-EPI Creatinine Equation (2021)    Anion gap 15 5 - 15    Comment: Performed at [facility], [address], [place], [place] [address]  Glucose, capillary     Status: Abnormal   Collection Time: 03/11/24 12:52 PM  Result Value Ref Range   Glucose-Capillary 105 (H) 70 - 99 mg/dL    Comment: Glucose reference range applies only to samples taken after fasting for at least 8 hours.   Comment 1 Notify RN    No results found.  Review of Systems  All other systems reviewed and are negative.   Blood pressure (!) 149/86, pulse 76, temperature 98.4 F (36.9 C), temperature source Oral, resp. rate 20, height 6' 3 (1.905 m), weight 119.7 kg, SpO2 96%. Physical Exam Constitutional:      Appearance: Normal appearance. He is normal weight.  HENT:     Head: Normocephalic and atraumatic.     Right Ear: External ear normal.     Left Ear: External ear normal.     Nose: Nose normal.     Mouth/Throat:     Mouth: Mucous membranes are moist.     Pharynx: Oropharynx is clear.  Eyes:     Extraocular Movements: Extraocular movements intact.     Pupils: Pupils are equal, round, and reactive to light.  Cardiovascular:     Rate and Rhythm: Normal rate.  Pulmonary:     Effort: Pulmonary effort is normal.  Skin:    General: Skin is warm and dry.  Neurological:     General: No focal deficit  present.     Mental Status: He is alert and oriented to person, place, and time.  Psychiatric:        Mood and Affect: Mood normal.        Behavior: Behavior normal.        Thought Content: Thought content normal.        Judgment: Judgment normal.      Assessment/Plan Obstructive sleep apnea and BMI 33  To OR for hypoglossal nerve stimulator placement.  Vaughan Ricker, MD 03/11/2024, 1:43 PM

## 2024-03-11 NOTE — Anesthesia Procedure Notes (Signed)
 Procedure Name: Intubation Date/Time: 03/11/2024 2:24 PM  Performed by: Cindie Donald CROME, CRNAPre-anesthesia Checklist: Patient identified, Emergency Drugs available, Suction available and Patient being monitored Patient Re-evaluated:Patient Re-evaluated prior to induction Oxygen Delivery Method: Circle system utilized Preoxygenation: Pre-oxygenation with 100% oxygen Induction Type: IV induction Ventilation: Mask ventilation without difficulty Laryngoscope Size: Mac and 4 Tube type: Oral Tube size: 7.5 mm Number of attempts: 1 Airway Equipment and Method: Stylet and Oral airway Placement Confirmation: ETT inserted through vocal cords under direct vision, positive ETCO2 and breath sounds checked- equal and bilateral Secured at: 23 cm Tube secured with: Tape Dental Injury: Teeth and Oropharynx as per pre-operative assessment

## 2024-03-11 NOTE — Op Note (Signed)
 PREOPERATIVE DIAGNOSIS:  Obstructive sleep apnea.   POSTOPERATIVE DIAGNOSIS:  Obstructive sleep apnea.   PROCEDURE:  Placement of hypoglossal nerve stimulator (Inspire 5) including testing of stimulator.   SURGEON:  Vaughan Ricker, MD   ASSISTANT:  RNFA, who was necessary for assistance with retraction and closure.   ANESTHESIA:  General endotracheal anesthesia.   COMPLICATIONS:  None.   INDICATIONS:  The patient is a 48 year old male with a history of obstructive sleep apnea who has not been able to tolerate CPAP.  He presents to the operating room for placement of hypoglossal nerve stimulator.   FINDINGS:  Surgical anatomy was unremarkable.  The device was tested intraoperatively and demonstrated excellent stimulation lead and accelerometer function.   DESCRIPTION OF PROCEDURE:  The patient was identified in the holding room, informed consent having been obtained, including discussion of risks, benefits and alternatives, the patient was brought to the operative suite and put on the operative table in  supine position.  Anesthesia was induced and the patient was intubated by the anesthesia team without difficulty.  The patient was given intravenous antibiotics during the case.  The eyes were taped closed and the bed was turned 180 degrees from  anesthesia and a shoulder roll was placed.  The right neck and right chest incisions were marked with a marking pen after measuring and injected with 1% lidocaine  with 1:100,000 epinephrine.  The nerve integrity monitor was placed in the right lateral  tongue and floor of mouth and turned on during the case.  The right neck and chest were prepped and draped in sterile fashion.  The neck incision was made with a 15 blade scalpel and extended through subcutaneous tissue to the platysmal layer using Bovie  electrocautery.  Dissection was then extended down the platysma layer somewhat until it was divided more inferiorly.  This allowed direct dissection down  onto the lower portion of the submandibular gland and ultimately the digastric tendon.  The tendon  was retracted inferiorly with two vessel loops.  Further dissection along the digastric exposed the mylohyoid muscle, which was then retracted anteriorly exposing the hypoglossal nerve.  The fascia over the nerve was then divided using bipolar  electrocautery to control bleeding.  The various branches of the nerve were then identified including the C1 branch and the more distal branches off of the main trunk.  Nerve stimulator was then used to identify which branches would be included as  protrusion branches and excluded as retrusion branches.  The break point between that on the superior surface of the nerve was then identified and the inclusion portion of the nerve was then elevated allowing pocket for cuff placement.  The cuff was then  brought into the field and placed around the inclusion branches and properly positioned.  Saline was then injected under the cuff.  The anchor for this lead was then sutured to the digastric tendon using 3-0 silk suture in 2 positions.  The stimulating lead was  then fully placed in the neck and covered with a damp gauze.  The infraclavicular incision was made using a 15 blade scalpel and extended through the subcutaneous tissues using Bovie electrocautery down to the pectoralis fascia.  A pocket was then  created inferiorly for the generator.  2 stay sutures were then placed at the superior lateral extent of the wound using 2-0 silk suture and these were left in place.  The neck incision was then uncovered and the lead unraveled.  The tunneling pocket was started with  a hemostat under the platysma muscle and extended with the tunneling down over the clavicle to the generator pocket and the stimulating lead was then pulled into  the generator pocket with a little bit of slack left in the neck.  The lead was then cleaned off with damp gauze and dried.  It was placed into the  generator tightening down the screws to 3 clicks.  The lead was tugged and found to be in good position.  The generator was then positioned into the generator pocket and testing then commenced.  After testing demonstrated good stimulation lead and accelerometer function, each wound was copiously irrigated with saline.  The chest wound was closed in the deep tissues with 3-0 Vicryl suture in simple interrupted fashion and in the subcutaneous layer using 4-0 Vicryl suture in a simple interrupted fashion.  The neck incision was closed in the platysma layer with 3-0 Vicryl suture in a  simple interrupted fashion and then in the subcutaneous layer using 4-0 Vicryl suture in a simple interrupted fashion.  Both incisions were covered with Dermabond.  The patient was then cleaned off and drapes were removed.  The nerve integrity monitor  was removed.  Each incision was then covered with gauze pressure dressing.  The patient was then returned to anesthesia for wakeup and was extubated and moved to the recovery room in stable condition.

## 2024-03-12 ENCOUNTER — Encounter (HOSPITAL_COMMUNITY): Payer: Self-pay | Admitting: Otolaryngology

## 2024-03-12 NOTE — Anesthesia Postprocedure Evaluation (Signed)
 Anesthesia Post Note  Patient: Matthew [name]  Procedure(s) Performed: INSERTION, HYPOGLOSSAL NERVE STIMULATOR (Right: Chest)     Patient location during evaluation: PACU Anesthesia Type: General Level of consciousness: awake and alert Pain management: pain level controlled Vital Signs Assessment: post-procedure vital signs reviewed and stable Respiratory status: spontaneous breathing, nonlabored ventilation, respiratory function stable and patient connected to nasal cannula oxygen Cardiovascular status: blood pressure returned to baseline and stable Postop Assessment: no apparent nausea or vomiting Anesthetic complications: no   No notable events documented.  Last Vitals:  Vitals:   03/11/24 1645 03/11/24 1700  BP: 121/82 133/77  Pulse: 95 (!) 101  Resp: 14 13  Temp:  36.6 C  SpO2: 94% 95%    Last Pain:  Vitals:   03/11/24 1615  TempSrc:   PainSc: 0-No pain                 Sarahgrace Broman L Vinnie Bobst

## 2024-04-01 ENCOUNTER — Other Ambulatory Visit: Payer: Self-pay | Admitting: Physician Assistant

## 2024-04-12 ENCOUNTER — Other Ambulatory Visit: Payer: Self-pay

## 2024-04-12 MED ORDER — VALBENAZINE TOSYLATE 80 MG PO CAPS: 80.0000 mg | ORAL_CAPSULE | Freq: Every day | ORAL | 5 refills | Status: AC

## 2024-04-18 ENCOUNTER — Other Ambulatory Visit: Payer: Self-pay | Admitting: Physician Assistant

## 2024-04-27 NOTE — Progress Notes (Unsigned)
 Name: Matthew [name]  Age/ Sex: 48 y.o., male   MRN/ DOB: [id number], 1975-06-06     PCP: Jhon Elveria LABOR, MD   Reason for Endocrinology Evaluation: Type 2 Diabetes Mellitus  Initial Endocrine Consultative Visit: 08/15/2020    PATIENT IDENTIFIER: [name] is a 48 y.o. male with a past medical history of T2DN, CAD , OSA and schizoaffective d/o. The patient has followed with Endocrinology clinic since 08/15/2020 for consultative assistance with management of his diabetes.  DIABETIC HISTORY:  Matthew [name] was diagnosed with DM 2021,he has been on metformin  and jardiance  . His hemoglobin A1c has ranged from 6.4% in 2020, peaking at 12.8% in 2022.   Pt on disability for mental health   Started Ozempic  03/2023 with an A1c of 9.4%     SUBJECTIVE:   During the last visit (11/04/2023): A1c 6.4 %  Today (04/27/2024): Matthew [name] is here for a diabetes management.  He was using Dexcom for glucose checks, but has not been using the Dexcom for ~ 2-3 months ago   Unfortunately he lost his sister ~ 2 months ago, and lost his mother earlier this year as well  The pt is S/P hypoglossal nerve stimulator in 02/2024 for OSA . Follows with ENT  He had his annual physician 02/2024.  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 Patient continues to follow-up with psychiatry Patient continues to follow-up with cardiology for CAD, ischemic cardiomyopathy and dyslipidemia  No nausea No constipation or diarrhea   HOME DIABETES REGIMEN:  Metformin  750 mg , 2 tablets daily  Jardiance  25 mg, 1 tablet before Breakfast Ozempic  2 mg weekly Lantus   20 units daily      Statin: yes ACE-I/ARB: yes   CONTINUOUS GLUCOSE MONITORING RECORD INTERPRETATION : N/A     DIABETIC COMPLICATIONS: Microvascular complications:  Neuropathy Denies: CKD, retinopathy Last Eye Exam: past due   Macrovascular complications:  CAD Denies: CVA, PVD   HISTORY:  Past Medical History:  Past  Medical History:  Diagnosis Date   ADHD (attention deficit hyperactivity disorder)    Anxiety    Arthritis    Celiac disease    Coronary artery disease    Dairy allergy    Depression    Diabetes mellitus without complication (HCC)    type 2   Elevated LFTs    Elevated liver enzymes    High cholesterol    Hypertension    Hypothyroidism    Myocardial infarction (HCC)    OSA (obstructive sleep apnea)    Paranoia (HCC)    Schizoaffective disorder (HCC)    and bipolar   Past Surgical History:  Past Surgical History:  Procedure Laterality Date   APPENDECTOMY     CARDIAC CATHETERIZATION     COLONOSCOPY     CORONARY STENT INTERVENTION N/A 06/06/2020   Procedure: CORONARY STENT INTERVENTION;  Surgeon: Jordan, Peter M, MD;  Location: [facility] INVASIVE CV LAB;  Service: Cardiovascular;  Laterality: N/A;  prox LAD, distal RCA   CORONARY ULTRASOUND/IVUS N/A 06/06/2020   Procedure: Intravascular Ultrasound/IVUS;  Surgeon: Jordan, Peter M, MD;  Location: [facility] INVASIVE CV LAB;  Service: Cardiovascular;  Laterality: N/A;   DRUG INDUCED ENDOSCOPY Bilateral 02/13/2024   Procedure: DRUG INDUCED SLEEP ENDOSCOPY;  Surgeon: Carlie Clark, MD;  Location: [facility] ENDOSCOPY;  Service: ENT;  Laterality: Bilateral;   IMPLANTATION OF HYPOGLOSSAL NERVE STIMULATOR Right 03/11/2024   Procedure: INSERTION, HYPOGLOSSAL NERVE STIMULATOR;  Surgeon: Carlie Clark, MD;  Location: [facility] OR;  Service: ENT;  Laterality: Right;  For procedure length; do not compare to prior Hypoglossal nerve stimulator procedures, see nursing note   LEFT HEART CATH AND CORONARY ANGIOGRAPHY N/A 06/06/2020   Procedure: LEFT HEART CATH AND CORONARY ANGIOGRAPHY;  Surgeon: Jordan, Peter M, MD;  Location: [facility] INVASIVE CV LAB;  Service: Cardiovascular;  Laterality: N/A;   LEFT HEART CATH AND CORONARY ANGIOGRAPHY N/A 02/24/2021   Procedure: LEFT HEART CATH AND CORONARY ANGIOGRAPHY;  Surgeon: Claudene Victory ORN, MD;  Location: [facility] INVASIVE CV LAB;  Service:  Cardiovascular;  Laterality: N/A;   NASAL SEPTUM SURGERY     TOTAL SHOULDER ARTHROPLASTY Right 11/28/2021   Procedure: TOTAL SHOULDER ARTHROPLASTY;  Surgeon: Josefina Chew, MD;  Location: [facility] ORS;  Service: Orthopedics;  Laterality: Right;   UPPER GI ENDOSCOPY     WRIST FRACTURE SURGERY     right wrist   Social History:  reports that he has been smoking cigarettes. He has a 1 pack-year smoking history. He has never used smokeless tobacco. He reports that he does not drink alcohol and does not use drugs. Family History:  Family History  Problem Relation Age of Onset   Asthma Mother    Hypertension Mother    CVA Mother    Hypertension Father    Leukemia Sister        Died 03-01-24     HOME MEDICATIONS: Allergies as of 04/28/2024       Reactions   Gluten Meal Other (See Comments)   Celiac disease   Lactose Intolerance (gi) Diarrhea   Can tolerate hard cheese        Medication List        Accurate as of April 27, 2024  3:35 PM. If you have any questions, ask your nurse or doctor.          ARIPiprazole  20 MG tablet Commonly known as: ABILIFY  TAKE 1 TABLET BY MOUTH EVERY DAY   aspirin  EC 81 MG tablet Take 1 tablet (81 mg total) by mouth daily. Swallow whole.   atomoxetine  100 MG capsule Commonly known as: STRATTERA  TAKE 1 CAPSULE BY MOUTH EVERY DAY   atorvastatin  80 MG tablet Commonly known as: LIPITOR  Take 1 tablet (80 mg total) by mouth daily.   busPIRone  15 MG tablet Commonly known as: BUSPAR  Take 1 tablet (15 mg total) by mouth daily.   carvedilol  12.5 MG tablet Commonly known as: COREG  Take 3 tablets (37.5 mg total) by mouth 2 (two) times daily.   clopidogrel  75 MG tablet Commonly known as: PLAVIX  Take 1 tablet (75 mg total) by mouth daily.   DULoxetine  60 MG capsule Commonly known as: Cymbalta  Take 2 capsules (120 mg total) by mouth daily.   empagliflozin  25 MG Tabs tablet Commonly known as: Jardiance  Take 1 tablet (25 mg total) by mouth  every morning.   eszopiclone  2 MG Tabs tablet Commonly known as: LUNESTA  Take 1 tablet (2 mg total) by mouth at bedtime as needed for sleep. Take immediately before bedtime   fenofibrate  145 MG tablet Commonly known as: Tricor  Take 1 tablet (145 mg total) by mouth daily.   gabapentin  800 MG tablet Commonly known as: NEURONTIN  TAKE 1 TABLET BY MOUTH 4 TIMES A DAY WITH AN EXTRA 1/2 TABLET DURING THE DAY AS NEEDED   HYDROcodone -acetaminophen  5-325 MG tablet Commonly known as: NORCO/VICODIN Take 1-2 tablets by mouth every 6 (six) hours as needed.   hydrOXYzine  50 MG tablet Commonly known as: ATARAX  TAKE 1 TABLET BY MOUTH EVERY 6 HOURS AS NEEDED  icosapent  Ethyl 1 g capsule Commonly known as: Vascepa  Take 2 capsules (2 g total) by mouth 2 (two) times daily.   Insulin  Pen Needle 32G X 4 MM Misc 1 Device by Does not apply route daily in the afternoon.   Lantus  SoloStar 100 UNIT/ML Solostar Pen Generic drug: insulin  glargine Inject 20 Units into the skin daily.   metFORMIN  750 MG 24 hr tablet Commonly known as: GLUCOPHAGE -XR Take 2 tablets (1,500 mg total) by mouth daily with breakfast.   Nexlizet  180-10 MG Tabs Generic drug: Bempedoic Acid-Ezetimibe  Take 1 tablet by mouth daily.   omeprazole 40 MG capsule Commonly known as: PRILOSEC Take 40 mg by mouth daily.   Ozempic  (2 MG/DOSE) 8 MG/3ML Sopn Generic drug: Semaglutide  (2 MG/DOSE) INJECT 2MG  DOSE INTO THE SKIN ONE TIME PER WEEK   tadalafil 5 MG tablet Commonly known as: CIALIS Take 5 mg by mouth daily.   Tavaborole  5 % Soln Commonly known as: Kerydin  Apply 1 drop topically daily. Apply 1 drop to the toenail daily.   valbenazine  80 MG capsule Commonly known as: INGREZZA  Take 1 capsule (80 mg total) by mouth daily.         OBJECTIVE:   Vital Signs: There were no vitals taken for this visit.  Wt Readings from Last 3 Encounters:  03/11/24 264 lb (119.7 kg)  02/13/24 (P) 264 lb 8.8 oz (120 kg)  12/24/23  265 lb (120.2 kg)     Exam: General: Pt appears well and is in NAD  Lungs: Clear with good BS bilat   Heart: RRR   Abdomen: soft, nontender  Extremities: No pretibial edema.   Neuro: MS is good with appropriate affect, pt is alert and Ox3    DM foot exam: 03/09/2024 per podiatry    DATA REVIEWED:  Lab Results  Component Value Date   HGBA1C 6.4 (A) 11/04/2023   HGBA1C 7.2 (A) 07/04/2023   HGBA1C 9.4 (A) 03/25/2023   Labs through Care Everywhere 03/16/2024  Triglycerides 406 LDL 52  Glucose 132 BUN 23 Creatinine 1.23 GFR 81 TSH 0.925   ASSESSMENT / PLAN / RECOMMENDATIONS:   1) Type 2 Diabetes Mellitus, Optimally  controlled, With neuropathic  complications - Most recent A1c of 6.5 %. Goal A1c < 7.0 %.     -A1c remains optimal -He was encouraged to restart using the Dexcom for glucose checkups -No changes at this time   MEDICATIONS: Continue metformin  750 mg , 2 tablet before Breakfast  Continue  Jardiance  25 mg, 1 tablet before Breakfast  Continue Lantus   20  units once daily  Continue Ozempic  2 mg weekly  EDUCATION / INSTRUCTIONS: BG monitoring instructions: Patient is instructed to check his blood sugars 1 times a day. Call [facility] if: BG persistently < 70  I reviewed the Rule of 15 for the treatment of hypoglycemia in detail with the patient. Literature supplied.   2) Diabetic complications:  Eye: Does not have known diabetic retinopathy.  Neuro/ Feet: Does not have known diabetic peripheral neuropathy .  Renal: Patient does not have known baseline CKD. He   is  on an ACEI/ARB at present.      3) Hypertriglyceridemia   - Per cardiology      F/U in 6 months     Signed electronically by: Stefano Redgie Butts, MD  [facility] Endocrinology  [facility] [address] [place], [place] [address] Phone: [phone] FAX: [phone]   CC: Jhon Elveria LABOR, MD [phone]  [address] [place] [address] Phone: [phone]  Fax: [phone]  Return to Endocrinology clinic as below: Future Appointments  Date Time Provider Department Center  04/28/2024 10:30 AM Giorgio Chabot, Donell Cardinal, MD [facility] None  05/01/2024 10:30 AM Hope Almarie ORN, NP [facility] [address]  06/02/2024 11:00 AM Rhys Boyer T, PA-C [facility] None  06/30/2024  1:15 PM Paci, Rufus HERO, MD [facility] None

## 2024-04-28 ENCOUNTER — Encounter: Payer: Self-pay | Admitting: Internal Medicine

## 2024-04-28 ENCOUNTER — Ambulatory Visit: Admitting: Internal Medicine

## 2024-04-28 VITALS — BP 130/90 | Ht 75.0 in | Wt 272.0 lb

## 2024-04-28 DIAGNOSIS — Z794 Long term (current) use of insulin: Secondary | ICD-10-CM | POA: Diagnosis not present

## 2024-04-28 DIAGNOSIS — E1142 Type 2 diabetes mellitus with diabetic polyneuropathy: Secondary | ICD-10-CM

## 2024-04-28 LAB — POCT GLYCOSYLATED HEMOGLOBIN (HGB A1C): Hemoglobin A1C: 6.5 % — AB (ref 4.0–5.6)

## 2024-04-28 MED ORDER — INSULIN PEN NEEDLE 32G X 4 MM MISC: 1.0000 | Freq: Every day | 3 refills | Status: AC

## 2024-04-28 MED ORDER — EMPAGLIFLOZIN 25 MG PO TABS: 25.0000 mg | ORAL_TABLET | Freq: Every morning | ORAL | 3 refills | Status: AC

## 2024-04-28 MED ORDER — DEXCOM G7 SENSOR MISC: 1.0000 | 3 refills | Status: AC

## 2024-04-28 MED ORDER — METFORMIN HCL ER 750 MG PO TB24: 1500.0000 mg | ORAL_TABLET | Freq: Every day | ORAL | 3 refills | Status: AC

## 2024-04-28 MED ORDER — OZEMPIC (2 MG/DOSE) 8 MG/3ML ~~LOC~~ SOPN: 2.0000 mg | PEN_INJECTOR | SUBCUTANEOUS | 3 refills | Status: AC

## 2024-04-28 MED ORDER — LANTUS SOLOSTAR 100 UNIT/ML ~~LOC~~ SOPN: 20.0000 [IU] | PEN_INJECTOR | Freq: Every day | SUBCUTANEOUS | 4 refills | Status: AC

## 2024-04-28 NOTE — Patient Instructions (Signed)
-   Continue Ozempic  2 mg weekly  - Continue  Metformin  750 mg ,2  tablet before Breakfast  - Continue  Jardiance  25 mg, 1 tablet before Breakfast  - Continue  Lantus  20  units once daily      HOW TO TREAT LOW BLOOD SUGARS (Blood sugar LESS THAN 70 MG/DL) Please follow the RULE OF 15 for the treatment of hypoglycemia treatment (when your (blood sugars are less than 70 mg/dL)   STEP 1: Take 15 grams of carbohydrates when your blood sugar is low, which includes:  3-4 GLUCOSE TABS  OR 3-4 OZ OF JUICE OR REGULAR SODA OR ONE TUBE OF GLUCOSE GEL    STEP 2: RECHECK blood sugar in 15 MINUTES STEP 3: If your blood sugar is still low at the 15 minute recheck --> then, go back to STEP 1 and treat AGAIN with another 15 grams of carbohydrates.

## 2024-04-29 ENCOUNTER — Telehealth: Payer: Self-pay

## 2024-04-29 ENCOUNTER — Other Ambulatory Visit: Payer: Self-pay | Admitting: Behavioral Health

## 2024-04-29 NOTE — Telephone Encounter (Signed)
 PA approved Ingrezza  80 mg 05/22/23-05/20/25 [employer] [id number]

## 2024-04-30 ENCOUNTER — Telehealth: Payer: Self-pay

## 2024-04-30 NOTE — Telephone Encounter (Signed)
 This pt is coming in for his Inspire appt however, pt has not been seen since 2016.   Would this still need to be made out as a consult too or no?

## 2024-04-30 NOTE — Telephone Encounter (Signed)
 Fine, he likely had it placed by ENT and it is an activation

## 2024-05-01 ENCOUNTER — Encounter: Payer: Self-pay | Admitting: Primary Care

## 2024-05-01 ENCOUNTER — Telehealth: Payer: Self-pay | Admitting: *Deleted

## 2024-05-01 ENCOUNTER — Ambulatory Visit: Admitting: Primary Care

## 2024-05-01 ENCOUNTER — Telehealth: Payer: Self-pay | Admitting: Primary Care

## 2024-05-01 VITALS — BP 128/70 | HR 92 | Temp 98.0°F | Ht 75.0 in | Wt 266.0 lb

## 2024-05-01 DIAGNOSIS — Z9682 Presence of neurostimulator: Secondary | ICD-10-CM | POA: Diagnosis not present

## 2024-05-01 DIAGNOSIS — Z4542 Encounter for adjustment and management of neuropacemaker (brain) (peripheral nerve) (spinal cord): Secondary | ICD-10-CM

## 2024-05-01 DIAGNOSIS — G4733 Obstructive sleep apnea (adult) (pediatric): Secondary | ICD-10-CM | POA: Diagnosis not present

## 2024-05-01 DIAGNOSIS — L7632 Postprocedural hematoma of skin and subcutaneous tissue following other procedure: Secondary | ICD-10-CM

## 2024-05-01 NOTE — Telephone Encounter (Signed)
 Inspire activated today, needs 1 month follow-up with me in person

## 2024-05-01 NOTE — Telephone Encounter (Signed)
 I called and spoke with patient, he had not downloaded the inspire app to his phone.  I advised him to go either to the apple store or google play depending on the type of phone he has and download that app to his phone.  I let him know that doing that will speedup things during his visit as far as the teaching for his remote.  He verbalized understanding and stated he would do this prior to his appointment.  Advised to arrive at 10:15 am for his 10:30 appointment.  Nothing further needed.

## 2024-05-01 NOTE — Patient Instructions (Addendum)
 Inspire device was activated today Your current level is 1. There are 11 level on your device Every week on FRIDAY please increase inspire 1 level as tolerates Open up your app and upload your data 1-2 times a week   Follow-up 4 weeks with BETH in office

## 2024-05-01 NOTE — Progress Notes (Signed)
 @Patient  ID: Matthew [name], male    DOB: 11-21-1975, 48 y.o.   MRN: [id number]  Chief Complaint  Patient presents with   Obstructive Sleep Apnea    Inspire     Referring provider: Jhon Elveria LABOR, MD  HPI: 48 year old, former smoker. PMH OSA, type 2 diabetes, HTN, CAD, NSTEMI, ADHD, dyslipidemia, morbid, obesity.   05/01/2024- Interim hx  Discussed the use of AI scribe software for clinical note transcription with the patient, who gave verbal consent to proceed.  History of Present Illness Matthew [name] is a 49 year old male with severe sleep apnea who presents for follow-up after Inspire device implantation.  He underwent Inspire device implantation on March 11, 2024, to address severe sleep apnea, initially diagnosed in 2016 with a sleep study showing 30 events per hour. He was intolerant to CPAP therapy due to discomfort.  Postoperatively, he developed a hematoma at the right neck site where the device was implanted. The hematoma has decreased in prominence over time, with bruising extending to the chest. He resumed baby aspirin , and Plavix  was deferred until March 18, 2024. No current pain or discomfort reported by the patient at the site.  His sleep pattern involves falling asleep within 45 minutes to an hour and sleeping for about six hours, waking intermittently. He usually goes to bed around 8 or 9 PM and wakes up at 4:30 AM. He described sensations during Inspire device activation as 'a little, almost numbness' and 'an electrical shock, like in the back,' but not painful.  He typically wakes up at night to use the restroom and can fall back asleep easily. He reports getting three to five hours of sleep on average, with a bedtime around 8 or 9 PM and waking up at 4:30 AM, not for work but due to his sleep pattern.  Inspire activation  Sensation- 0.7  Function - 0.9 (Lower limit 0.9V - upper limit 1.9V) Start delay- 60 min Pause time- Duration- 7 hours   Electrode A Pulse width 90 Rate 33 hz  Allergies[1]  Immunization History  Administered Date(s) Administered   Influenza-Unspecified 03/21/2014    Past Medical History:  Diagnosis Date   ADHD (attention deficit hyperactivity disorder)    Anxiety    Arthritis    Celiac disease    Coronary artery disease    Dairy allergy    Depression    Diabetes mellitus without complication (HCC)    type 2   Elevated LFTs    Elevated liver enzymes    High cholesterol    Hypertension    Hypothyroidism    Myocardial infarction (HCC)    OSA (obstructive sleep apnea)    Paranoia (HCC)    Schizoaffective disorder (HCC)    and bipolar    Tobacco History: Tobacco Use History[2] Ready to quit: Not Answered Counseling given: Not Answered Tobacco comments: Trying to cut back, now using nicotine  pouches per pt on 02/04/24.   Outpatient Medications Prior to Visit  Medication Sig Dispense Refill   ARIPiprazole  (ABILIFY ) 20 MG tablet TAKE 1 TABLET BY MOUTH EVERY DAY 90 tablet 0   aspirin  EC 81 MG tablet Take 1 tablet (81 mg total) by mouth daily. Swallow whole. 30 tablet 0   atomoxetine  (STRATTERA ) 100 MG capsule TAKE 1 CAPSULE BY MOUTH EVERY DAY 60 capsule 0   atorvastatin  (LIPITOR ) 80 MG tablet Take 1 tablet (80 mg total) by mouth daily. 90 tablet 3   Bempedoic Acid-Ezetimibe  (NEXLIZET ) 180-10 MG  TABS Take 1 tablet by mouth daily. 90 tablet 0   busPIRone  (BUSPAR ) 15 MG tablet Take 1 tablet (15 mg total) by mouth daily. 180 tablet 1   carvedilol  (COREG ) 12.5 MG tablet Take 3 tablets (37.5 mg total) by mouth 2 (two) times daily. 540 tablet 3   clopidogrel  (PLAVIX ) 75 MG tablet Take 1 tablet (75 mg total) by mouth daily. 90 tablet 1   Continuous Glucose Sensor (DEXCOM G7 SENSOR) MISC 1 Device by Does not apply route as directed. 9 each 3   DULoxetine  (CYMBALTA ) 60 MG capsule Take 2 capsules (120 mg total) by mouth daily. 180 capsule 1   empagliflozin  (JARDIANCE ) 25 MG TABS tablet Take 1 tablet  (25 mg total) by mouth every morning. 90 tablet 3   eszopiclone  (LUNESTA ) 2 MG TABS tablet Take 1 tablet (2 mg total) by mouth at bedtime as needed for sleep. Take immediately before bedtime 30 tablet 5   fenofibrate  (TRICOR ) 145 MG tablet Take 1 tablet (145 mg total) by mouth daily. 90 tablet 3   gabapentin  (NEURONTIN ) 800 MG tablet TAKE 1 TABLET BY MOUTH 4 TIMES A DAY WITH AN EXTRA 1/2 TABLET DURING THE DAY AS NEEDED 135 tablet 2   HYDROcodone -acetaminophen  (NORCO/VICODIN) 5-325 MG tablet Take 1-2 tablets by mouth every 6 (six) hours as needed. 12 tablet 0   hydrOXYzine  (ATARAX ) 50 MG tablet TAKE 1 TABLET BY MOUTH EVERY 6 HOURS AS NEEDED 360 tablet 0   icosapent  Ethyl (VASCEPA ) 1 g capsule Take 2 capsules (2 g total) by mouth 2 (two) times daily. 360 capsule 3   insulin  glargine (LANTUS  SOLOSTAR) 100 UNIT/ML Solostar Pen Inject 20 Units into the skin daily. 15 mL 4   Insulin  Pen Needle 32G X 4 MM MISC 1 Device by Does not apply route daily in the afternoon. 100 each 3   metFORMIN  (GLUCOPHAGE -XR) 750 MG 24 hr tablet Take 2 tablets (1,500 mg total) by mouth daily with breakfast. 180 tablet 3   omeprazole (PRILOSEC) 40 MG capsule Take 40 mg by mouth daily.     Semaglutide , 2 MG/DOSE, (OZEMPIC , 2 MG/DOSE,) 8 MG/3ML SOPN 2 mg by Other route once a week. 9 mL 3   tadalafil (CIALIS) 5 MG tablet Take 5 mg by mouth daily.     Tavaborole  (KERYDIN ) 5 % SOLN Apply 1 drop topically daily. Apply 1 drop to the toenail daily. 10 mL 2   valbenazine  (INGREZZA ) 80 MG capsule Take 1 capsule (80 mg total) by mouth daily. 30 capsule 5   No facility-administered medications prior to visit.   Review of Systems  Review of Systems  Constitutional: Negative.   HENT: Negative.    Respiratory: Negative.    Cardiovascular: Negative.      Physical Exam  BP 128/70   Pulse 92   Temp 98 F (36.7 C)   Ht 6' 3 (1.905 m) Comment: pt stated  Wt 266 lb (120.7 kg)   SpO2 97% Comment: ra  BMI 33.25 kg/m  Physical  Exam Constitutional:      Appearance: Normal appearance. He is well-developed.  HENT:     Head: Normocephalic and atraumatic.     Mouth/Throat:     Mouth: Mucous membranes are moist.     Pharynx: Oropharynx is clear.     Comments: Tongue protrusion midline. No weakness.  Neck:     Comments: Right neck swelling, no bruising  Cardiovascular:     Rate and Rhythm: Normal rate and regular rhythm.  Heart sounds: Normal heart sounds.  Pulmonary:     Effort: Pulmonary effort is normal. No respiratory distress.     Breath sounds: Normal breath sounds. No wheezing or rhonchi.  Musculoskeletal:        General: Normal range of motion.     Cervical back: Normal range of motion and neck supple.  Skin:    General: Skin is warm and dry.     Findings: No erythema or rash.  Neurological:     General: No focal deficit present.     Mental Status: He is alert and oriented to person, place, and time. Mental status is at baseline.  Psychiatric:        Mood and Affect: Mood normal.        Behavior: Behavior normal.        Thought Content: Thought content normal.        Judgment: Judgment normal.     Lab Results:  CBC    Component Value Date/Time   WBC 9.6 03/11/2024 1120   RBC 5.29 03/11/2024 1120   HGB 15.3 03/11/2024 1120   HCT 46.2 03/11/2024 1120   PLT 258 03/11/2024 1120   MCV 87.3 03/11/2024 1120   MCH 28.9 03/11/2024 1120   MCHC 33.1 03/11/2024 1120   RDW 13.9 03/11/2024 1120   LYMPHSABS 2.1 06/30/2022 1145   MONOABS 0.6 06/30/2022 1145   EOSABS 344 07/29/2023 1529   BASOSABS 121 07/29/2023 1529    BMET    Component Value Date/Time   NA 138 03/11/2024 1120   NA 141 02/08/2023 0832   K 4.0 03/11/2024 1120   CL 102 03/11/2024 1120   CO2 21 (L) 03/11/2024 1120   GLUCOSE 104 (H) 03/11/2024 1120   BUN 15 03/11/2024 1120   BUN 13 02/08/2023 0832   CREATININE 1.02 03/11/2024 1120   CREATININE 1.34 (H) 07/29/2023 1529   CALCIUM  9.5 03/11/2024 1120   GFRNONAA >60  03/11/2024 1120   GFRAA >60 10/20/2018 2047    BNP    Component Value Date/Time   BNP 151.1 (H) 06/07/2020 0500    ProBNP No results found for: PROBNP  Imaging: No results found.   Assessment & Plan:    1. OSA (obstructive sleep apnea) (Primary)  Assessment and Plan Assessment & Plan Obstructive sleep apnea Severe obstructive sleep apnea with 30 events per hour, previously intolerant to CPAP therapy. Recently implanted with Inspire device on October 22nd, 2025. Postoperative hematoma at the implant site, currently improving. No pain or discomfort reported. Device activation and settings adjustment performed today. Sensation level 0.7V and Functional level 0.9V. Discussed the importance of following a stepwise approach to increase device levels weekly to avoid discomfort and ensure consistent usage. Explained the use of the remote control and app for monitoring and adjusting settings. Discussed potential risks of increasing levels too quickly, including muscle discomfort and inconsistent usage. - Activated Inspire device with initial settings at level 1 (0.9V) - Instructed to increase device level by one each Friday, as long as comfortable. - Educated on the use of the remote control and app for monitoring and adjusting settings. - Wave form ran for 3 minutes  - Scheduled follow-up in one month to assess progress and adjust settings as needed.  Inspire activation  Sensation- 0.7  Function - 0.9 (Lower limit 0.9V - upper limit 1.9V) Start delay- 60 min Pause time- Duration- 7 hours  Electrode A Pulse width 90 Rate 33 hz  Postoperative hematoma at [facility] implant site  Postoperative hematoma developed at the right neck site where the [facility] device was implanted. Initially worsened but now improving. Bruising has dissipated. Residual lump and swelling to right neck present but expected to subside over time. No pain reported. - Continue to monitor hematoma for  resolution over time. - Ok to continue baby aspirin  and plavix  as previously instructed.  Recording duration: 29 minutes   I personally spent a total of 40 minutes in the care of the patient today including performing a medically appropriate exam/evaluation, counseling and educating, documenting clinical information in the EHR, and independently interpreting results.   Almarie LELON Ferrari, NP 05/01/2024     [1]  Allergies Allergen Reactions   Gluten Meal Other (See Comments)    Celiac disease   Lactose Intolerance (Gi) Diarrhea    Can tolerate hard cheese  [2]  Social History Tobacco Use  Smoking Status Every Day   Current packs/day: 1.00   Average packs/day: 1 pack/day for 1 year (1.0 ttl pk-yrs)   Types: Cigarettes  Smokeless Tobacco Never  Tobacco Comments   Trying to cut back, now using nicotine  pouches per pt on 02/04/24.

## 2024-05-05 ENCOUNTER — Other Ambulatory Visit: Payer: Self-pay | Admitting: Cardiology

## 2024-05-17 ENCOUNTER — Other Ambulatory Visit: Payer: Self-pay | Admitting: Physician Assistant

## 2024-05-20 NOTE — Telephone Encounter (Signed)
 Call Mr. Glazebrook, no answer. Left voicemail requesting a call back to schedule one month Inspire follow-up appointment.

## 2024-05-28 ENCOUNTER — Telehealth: Payer: Self-pay | Admitting: Cardiology

## 2024-05-28 NOTE — Telephone Encounter (Signed)
 Paper Work Dropped Off: Medical Clearance Request   Date: 05/28/24  Location of paper:  Provider Mailbox  **URGENT

## 2024-06-01 NOTE — Telephone Encounter (Signed)
 I have reviewed the patient's chart, there is no clearance from the requesting office in his chart.

## 2024-06-01 NOTE — Telephone Encounter (Signed)
 Patient is scheduled 06/10/2024 at 11:30am with [facility] for [facility] follow up. Nothing further needed

## 2024-06-01 NOTE — Telephone Encounter (Signed)
"  ° °  Primary Cardiologist: Wilbert Bihari, MD  Chart reviewed as part of pre-operative protocol coverage. Simple dental extractions (1-2 teeth), cleanings, and fillings are considered low risk procedures per guidelines and generally do not require any specific cardiac clearance. It is also generally accepted that for simple extractions and dental cleanings, there is no need to interrupt blood thinner therapy.   SBE prophylaxis is not required for the patient.  From a cardiology perspective, the patient may receive lidocaine  with epinephrine  for his dental procedures.   I will route this recommendation to the requesting party via Epic fax function and remove from pre-op pool.  Please call with questions.  Percy Rosaline HERO, NP 06/01/2024, 10:39 AM     "

## 2024-06-01 NOTE — Telephone Encounter (Signed)
 Caller (Brenna) is following--up on the status of patient's clearance as patient is now in the dental chair.  Fax# [phone].

## 2024-06-02 ENCOUNTER — Encounter: Payer: Self-pay | Admitting: Physician Assistant

## 2024-06-02 ENCOUNTER — Ambulatory Visit: Admitting: Physician Assistant

## 2024-06-02 DIAGNOSIS — F250 Schizoaffective disorder, bipolar type: Secondary | ICD-10-CM

## 2024-06-02 DIAGNOSIS — G2401 Drug induced subacute dyskinesia: Secondary | ICD-10-CM

## 2024-06-02 DIAGNOSIS — F99 Mental disorder, not otherwise specified: Secondary | ICD-10-CM | POA: Diagnosis not present

## 2024-06-02 DIAGNOSIS — F902 Attention-deficit hyperactivity disorder, combined type: Secondary | ICD-10-CM

## 2024-06-02 DIAGNOSIS — F5105 Insomnia due to other mental disorder: Secondary | ICD-10-CM | POA: Diagnosis not present

## 2024-06-02 DIAGNOSIS — F411 Generalized anxiety disorder: Secondary | ICD-10-CM

## 2024-06-02 MED ORDER — ARIPIPRAZOLE 20 MG PO TABS: 20.0000 mg | ORAL_TABLET | Freq: Every day | ORAL | 1 refills | Status: AC

## 2024-06-02 MED ORDER — BUSPIRONE HCL 15 MG PO TABS: ORAL_TABLET | ORAL | 1 refills | Status: AC

## 2024-06-02 MED ORDER — ATOMOXETINE HCL 100 MG PO CAPS: 100.0000 mg | ORAL_CAPSULE | Freq: Every day | ORAL | 11 refills | Status: AC

## 2024-06-02 NOTE — Progress Notes (Signed)
 "               [facility]  Patient ID: Matthew [name],  MRN: [id number]  PCP: Jhon Elveria LABOR, MD  Date of Evaluation: 06/02/2024 Time spent:20 minutes  Chief Complaint:  Chief Complaint   Follow-up    HISTORY/CURRENT STATUS: HPI For routine med check.   Christmas was tough, this was the first 1 after his sister died last year.  She had been diagnosed with AML not long prior to her death.  He made it through the holidays okay though.  No symptoms of depression.  He is of course sad after losing his sister.  Energy and motivation are good for the most part.  No feelings of hopelessness.  Sleeps well most of the time.  Takes the Lunesta  which is effective.  ADLs and personal hygiene are normal.   Denies any changes in concentration, making decisions, or remembering things.  Appetite has not changed.  Weight is stable.   Anxiety has gotten a little worse. Overwhelmed.  Feels panicky sometimes.   No mania, delirium, AH/VH.  No SI/HI.  Individual Medical History/ Review of Systems: Changes? :No     TD is stable  Past medications for mental health diagnoses include: Risperdal, Zyprexa , Abilify , [name] , Klonopin , Gabapentin , Xanax , Valium, Ativan , Hydroxyzine , Trilafon , Atenolol , Cymbalta , Prozac, Lexapro, Paxil, Lithium , Adderall, Ritalin, Mirtazepine, Sonata  was ineffective.  Austedo  helped some, Strattera , Quelbree caused headaches.  Ingrezza   Allergies: Gluten meal and Lactose intolerance (gi)  Current Medications:  Current Outpatient Medications:    aspirin  EC 81 MG tablet, Take 1 tablet (81 mg total) by mouth daily. Swallow whole., Disp: 30 tablet, Rfl: 0   atorvastatin  (LIPITOR ) 80 MG tablet, Take 1 tablet (80 mg total) by mouth daily., Disp: 90 tablet, Rfl: 3   Bempedoic Acid-Ezetimibe  (NEXLIZET ) 180-10 MG TABS, Take 1 tablet by mouth daily., Disp: 90 tablet, Rfl: 1   carvedilol  (COREG ) 12.5 MG tablet, Take 3 tablets (37.5 mg total) by mouth 2 (two) times daily., Disp:  540 tablet, Rfl: 3   clopidogrel  (PLAVIX ) 75 MG tablet, Take 1 tablet (75 mg total) by mouth daily., Disp: 90 tablet, Rfl: 1   Continuous Glucose Sensor (DEXCOM G7 SENSOR) MISC, 1 Device by Does not apply route as directed., Disp: 9 each, Rfl: 3   DULoxetine  (CYMBALTA ) 60 MG capsule, Take 2 capsules (120 mg total) by mouth daily., Disp: 180 capsule, Rfl: 1   empagliflozin  (JARDIANCE ) 25 MG TABS tablet, Take 1 tablet (25 mg total) by mouth every morning., Disp: 90 tablet, Rfl: 3   eszopiclone  (LUNESTA ) 2 MG TABS tablet, Take 1 tablet (2 mg total) by mouth at bedtime as needed for sleep. Take immediately before bedtime, Disp: 30 tablet, Rfl: 5   fenofibrate  (TRICOR ) 145 MG tablet, Take 1 tablet (145 mg total) by mouth daily., Disp: 90 tablet, Rfl: 3   gabapentin  (NEURONTIN ) 800 MG tablet, TAKE 1 TABLET BY MOUTH 4 TIMES A DAY WITH AN EXTRA 1/2 TABLET DURING THE DAY AS NEEDED, Disp: 135 tablet, Rfl: 2   HYDROcodone -acetaminophen  (NORCO/VICODIN) 5-325 MG tablet, Take 1-2 tablets by mouth every 6 (six) hours as needed., Disp: 12 tablet, Rfl: 0   hydrOXYzine  (ATARAX ) 50 MG tablet, TAKE 1 TABLET BY MOUTH EVERY 6 HOURS AS NEEDED, Disp: 360 tablet, Rfl: 0   icosapent  Ethyl (VASCEPA ) 1 g capsule, Take 2 capsules (2 g total) by mouth 2 (two) times daily., Disp: 360 capsule, Rfl: 3   insulin  glargine (LANTUS  SOLOSTAR)  100 UNIT/ML Solostar Pen, Inject 20 Units into the skin daily., Disp: 15 mL, Rfl: 4   Insulin  Pen Needle 32G X 4 MM MISC, 1 Device by Does not apply route daily in the afternoon., Disp: 100 each, Rfl: 3   metFORMIN  (GLUCOPHAGE -XR) 750 MG 24 hr tablet, Take 2 tablets (1,500 mg total) by mouth daily with breakfast., Disp: 180 tablet, Rfl: 3   omeprazole (PRILOSEC) 40 MG capsule, Take 40 mg by mouth daily., Disp: , Rfl:    Semaglutide , 2 MG/DOSE, (OZEMPIC , 2 MG/DOSE,) 8 MG/3ML SOPN, 2 mg by Other route once a week., Disp: 9 mL, Rfl: 3   tadalafil (CIALIS) 5 MG tablet, Take 5 mg by mouth daily., Disp: ,  Rfl:    UNABLE TO FIND, Inspire, Disp: , Rfl:    ARIPiprazole  (ABILIFY ) 20 MG tablet, Take 1 tablet (20 mg total) by mouth daily., Disp: 90 tablet, Rfl: 1   atomoxetine  (STRATTERA ) 100 MG capsule, Take 1 capsule (100 mg total) by mouth daily., Disp: 30 capsule, Rfl: 11   busPIRone  (BUSPAR ) 15 MG tablet, 2 p.o. every morning and 1 p.o. nightly, Disp: 270 tablet, Rfl: 1   Tavaborole  (KERYDIN ) 5 % SOLN, Apply 1 drop topically daily. Apply 1 drop to the toenail daily., Disp: 10 mL, Rfl: 2   valbenazine  (INGREZZA ) 80 MG capsule, Take 1 capsule (80 mg total) by mouth daily., Disp: 30 capsule, Rfl: 5 Medication Side Effects: none  Family Medical/ Social History: Changes?  Sister died Feb 14, 2024 from AML.   MENTAL HEALTH EXAM:  There were no vitals taken for this visit.There is no height or weight on file to calculate BMI.  General Appearance: Casual and Well Groomed  Eye Contact:  Good  Speech:  Clear and Coherent and Normal Rate  Volume:  Normal  Mood:  Euthymic  Affect:  Congruent  Thought Process:  Goal Directed and Descriptions of Associations: Circumstantial  Orientation:  Full (Time, Place, and Person)  Thought Content: Logical   Suicidal Thoughts:  No  Homicidal Thoughts:  No  Memory:  WNL  Judgement:  Fair  Insight:  Good  Psychomotor Activity:  Normal  Concentration:  Concentration: Good and Attention Span: Good  Recall:  Good  Fund of Knowledge: Good  Language: Good  Assets:  Communication Skills Desire for Improvement Financial Resources/Insurance Housing Resilience Transportation  ADL's:  Intact  Cognition: WNL  Prognosis:  Good         AIMS    Flowsheet Row Office Visit from 06/18/2023 in [facility] Office Visit from 03/18/2023 in [facility] Office Visit from 02/18/2023 in [facility] Office Visit from 05/30/2022 in [facility] Office Visit from  04/20/2022 in [facility]  AIMS Total Score 1 2 8 3 7    PHQ2-9    Flowsheet Row CARDIAC REHAB PHASE II ORIENTATION from 08/16/2020 in [facility]  PHQ-2 Total Score 0   Flowsheet Row Admission (Discharged) from 03/11/2024 in [facility]IOPERATIVE AREA Admission (Discharged) from 02/13/2024 in [facility] ENDOSCOPY ED from 09/02/2023 in [facility] Emergency Department at [facility]  C-SSRS RISK CATEGORY No Risk No Risk No Risk   PCP and cardiology treat abnormal labs.  DIAGNOSES:    ICD-10-CM   1. Generalized anxiety disorder  F41.1     2. Attention deficit hyperactivity disorder (ADHD), combined type, moderate  F90.2  3. Insomnia due to other mental disorder  F51.05    F99     4. Schizoaffective disorder, bipolar type (HCC)  F25.0     5. Tardive dyskinesia  G24.01      Receiving Psychotherapy: No   RECOMMENDATIONS:  PDMP reviewed.  Gabapentin  filled 05/04/2024. I provided approximately  20  minutes of face to face time during this encounter, including time spent before and after the visit in records review, medical decision making, counseling pertinent to today's visit, and charting.   Recommend increasing the BuSpar .  He agrees.  Continue Abilify  20 mg, 1 p.o. daily. Continue Strattera  100 mg, 1 p.o. daily. Increase BuSpar  15 mg, 2 p.o. every morning and 1 p.o. nightly. Continue Cymbalta  60 mg, 2 p.o. daily. Continue Lunesta  2 mg 1 po at bedtime prn. Continue gabapentin  800 mg 1 p.o. 4 times daily with 1/2 pill mid day.  Continue hydroxyzine  50 mg, 1 p.o. 4 times daily as needed.   Continue Ingrezza  80 mg daily.  Return in 6 weeks.  Verneita Cooks, PA-C  "

## 2024-06-09 ENCOUNTER — Encounter: Payer: Self-pay | Admitting: Physician Assistant

## 2024-06-10 ENCOUNTER — Encounter: Payer: Self-pay | Admitting: Primary Care

## 2024-06-10 ENCOUNTER — Ambulatory Visit: Admitting: Primary Care

## 2024-06-10 VITALS — BP 124/72 | HR 90 | Temp 98.7°F | Ht 75.0 in | Wt 269.0 lb

## 2024-06-10 DIAGNOSIS — F17210 Nicotine dependence, cigarettes, uncomplicated: Secondary | ICD-10-CM | POA: Diagnosis not present

## 2024-06-10 DIAGNOSIS — G4733 Obstructive sleep apnea (adult) (pediatric): Secondary | ICD-10-CM | POA: Diagnosis not present

## 2024-06-10 NOTE — Progress Notes (Signed)
 "  @Patient  ID: Matthew [name], male    DOB: 1976-01-19, 49 y.o.   MRN: [id number]  Chief Complaint  Patient presents with   Obstructive Sleep Apnea    INSPIRE    Referring provider: Jhon Elveria LABOR, MD  HPI: 49 year old, former smoker. PMH OSA, type 2 diabetes, HTN, CAD, NSTEMI, ADHD, dyslipidemia, morbid, obesity.   Previous [facility] pulmonary encounter: 05/01/2024 Discussed the use of AI scribe software for clinical note transcription with the patient, who gave verbal consent to proceed.  History of Present Illness Matthew [name] is a 49 year old male with severe sleep apnea who presents for follow-up after Inspire device implantation.  He underwent Inspire device implantation on March 11, 2024, to address severe sleep apnea, initially diagnosed in 2016 with a sleep study showing 30 events per hour. He was intolerant to CPAP therapy due to discomfort.  Postoperatively, he developed a hematoma at the right neck site where the device was implanted. The hematoma has decreased in prominence over time, with bruising extending to the chest. He resumed baby aspirin , and Plavix  was deferred until March 18, 2024. No current pain or discomfort reported by the patient at the site.  His sleep pattern involves falling asleep within 45 minutes to an hour and sleeping for about six hours, waking intermittently. He usually goes to bed around 8 or 9 PM and wakes up at 4:30 AM. He described sensations during Inspire device activation as 'a little, almost numbness' and 'an electrical shock, like in the back,' but not painful.  He typically wakes up at night to use the restroom and can fall back asleep easily. He reports getting three to five hours of sleep on average, with a bedtime around 8 or 9 PM and waking up at 4:30 AM, not for work but due to his sleep pattern.  Inspire activation  Sensation- 0.7  Function - 0.9 (Lower limit 0.9V - upper limit 1.9V) Start delay- 60 min Pause time-  Duration- 7 hours  Electrode A Pulse width 90 Rate 33 hz   06/10/2024- interim hx  Discussed the use of AI scribe software for clinical note transcription with the patient, who gave verbal consent to proceed.  History of Present Illness Matthew [name] is a 49 year old male with severe sleep apnea who presents for a one month follow-up after Inspire device activation.  He was diagnosed with severe sleep apnea in 2016 and was intolerant to CPAP due to discomfort. He had an Inspire device implanted on March 11, 2024. Postoperatively, he developed a hematoma on the right side of his neck, where the device was implanted, with bruising extending into his chest. He resumed taking baby aspirin  and Plavix  at the end of October. The hematoma has mostly resolved, though he can still feel some scarring.  The Inspire device was activated one month ago, and he is currently using it at level five (1.3V). His usage is sporadic, with about 40-50% of nights achieving more than four hours of use. He has difficulty remembering to activate the device before sleep, but notes that at level five, he is starting to feel it is more effective. He describes his sleep as more restorative, with less snoring and not feeling like he's 'running a race' upon waking.  He experiences a 'funky' feeling when the device is initially turned on, but no pain. The start delay is currently set at 60 minutes, which he finds too long as he falls asleep quickly.  Reports sleep has started to improve since starting inspire, especially on higher levels.      Allergies[1]  Immunization History  Administered Date(s) Administered   Influenza-Unspecified 03/21/2014    Past Medical History:  Diagnosis Date   ADHD (attention deficit hyperactivity disorder)    Anxiety    Arthritis    Celiac disease    Coronary artery disease    Dairy allergy    Depression    Diabetes mellitus without complication (HCC)    type 2    Elevated LFTs    Elevated liver enzymes    High cholesterol    Hypertension    Hypothyroidism    Myocardial infarction (HCC)    OSA (obstructive sleep apnea)    Paranoia (HCC)    Schizoaffective disorder (HCC)    and bipolar    Tobacco History: Tobacco Use History[2] Ready to quit: Not Answered Counseling given: Not Answered Tobacco comments: Pt smokes 0.5 PPD. AB, CMA 06-10-24 Trying to cut back, now using nicotine  pouches per pt on 02/04/24.   Outpatient Medications Prior to Visit  Medication Sig Dispense Refill   ARIPiprazole  (ABILIFY ) 20 MG tablet Take 1 tablet (20 mg total) by mouth daily. 90 tablet 1   aspirin  EC 81 MG tablet Take 1 tablet (81 mg total) by mouth daily. Swallow whole. 30 tablet 0   atomoxetine  (STRATTERA ) 100 MG capsule Take 1 capsule (100 mg total) by mouth daily. 30 capsule 11   atorvastatin  (LIPITOR ) 80 MG tablet Take 1 tablet (80 mg total) by mouth daily. 90 tablet 3   Bempedoic Acid-Ezetimibe  (NEXLIZET ) 180-10 MG TABS Take 1 tablet by mouth daily. 90 tablet 1   busPIRone  (BUSPAR ) 15 MG tablet 2 p.o. every morning and 1 p.o. nightly 270 tablet 1   carvedilol  (COREG ) 12.5 MG tablet Take 3 tablets (37.5 mg total) by mouth 2 (two) times daily. 540 tablet 3   clopidogrel  (PLAVIX ) 75 MG tablet Take 1 tablet (75 mg total) by mouth daily. 90 tablet 1   Continuous Glucose Sensor (DEXCOM G7 SENSOR) MISC 1 Device by Does not apply route as directed. 9 each 3   DULoxetine  (CYMBALTA ) 60 MG capsule Take 2 capsules (120 mg total) by mouth daily. 180 capsule 1   empagliflozin  (JARDIANCE ) 25 MG TABS tablet Take 1 tablet (25 mg total) by mouth every morning. 90 tablet 3   eszopiclone  (LUNESTA ) 2 MG TABS tablet Take 1 tablet (2 mg total) by mouth at bedtime as needed for sleep. Take immediately before bedtime 30 tablet 5   fenofibrate  (TRICOR ) 145 MG tablet Take 1 tablet (145 mg total) by mouth daily. 90 tablet 3   gabapentin  (NEURONTIN ) 800 MG tablet TAKE 1 TABLET BY MOUTH  4 TIMES A DAY WITH AN EXTRA 1/2 TABLET DURING THE DAY AS NEEDED 135 tablet 2   HYDROcodone -acetaminophen  (NORCO/VICODIN) 5-325 MG tablet Take 1-2 tablets by mouth every 6 (six) hours as needed. 12 tablet 0   hydrOXYzine  (ATARAX ) 50 MG tablet TAKE 1 TABLET BY MOUTH EVERY 6 HOURS AS NEEDED 360 tablet 0   icosapent  Ethyl (VASCEPA ) 1 g capsule Take 2 capsules (2 g total) by mouth 2 (two) times daily. 360 capsule 3   insulin  glargine (LANTUS  SOLOSTAR) 100 UNIT/ML Solostar Pen Inject 20 Units into the skin daily. 15 mL 4   Insulin  Pen Needle 32G X 4 MM MISC 1 Device by Does not apply route daily in the afternoon. 100 each 3   metFORMIN  (GLUCOPHAGE -XR) 750 MG 24 hr tablet  Take 2 tablets (1,500 mg total) by mouth daily with breakfast. 180 tablet 3   omeprazole (PRILOSEC) 40 MG capsule Take 40 mg by mouth daily.     Semaglutide , 2 MG/DOSE, (OZEMPIC , 2 MG/DOSE,) 8 MG/3ML SOPN 2 mg by Other route once a week. 9 mL 3   tadalafil (CIALIS) 5 MG tablet Take 5 mg by mouth daily.     Tavaborole  (KERYDIN ) 5 % SOLN Apply 1 drop topically daily. Apply 1 drop to the toenail daily. 10 mL 2   UNABLE TO FIND Inspire     valbenazine  (INGREZZA ) 80 MG capsule Take 1 capsule (80 mg total) by mouth daily. 30 capsule 5   No facility-administered medications prior to visit.    Review of Systems  Review of Systems  Respiratory: Negative.       Physical Exam  BP 124/72   Pulse 90   Temp 98.7 F (37.1 C)   Ht 6' 3 (1.905 m) Comment: PT STATED  Wt 269 lb (122 kg)   SpO2 94% Comment: RA  BMI 33.62 kg/m  Physical Exam Constitutional:      Appearance: Normal appearance. He is well-developed.  HENT:     Head: Normocephalic and atraumatic.     Mouth/Throat:     Mouth: Mucous membranes are moist.     Pharynx: Oropharynx is clear.  Cardiovascular:     Rate and Rhythm: Normal rate and regular rhythm.     Heart sounds: Normal heart sounds.  Pulmonary:     Effort: Pulmonary effort is normal. No respiratory  distress.     Breath sounds: Normal breath sounds. No wheezing or rhonchi.  Musculoskeletal:        General: Normal range of motion.     Cervical back: Normal range of motion and neck supple.  Skin:    General: Skin is warm and dry.     Findings: No erythema or rash.  Neurological:     General: No focal deficit present.     Mental Status: He is alert and oriented to person, place, and time. Mental status is at baseline.  Psychiatric:        Mood and Affect: Mood normal.        Behavior: Behavior normal.        Thought Content: Thought content normal.        Judgment: Judgment normal.     Lab Results:  CBC    Component Value Date/Time   WBC 9.6 03/11/2024 1120   RBC 5.29 03/11/2024 1120   HGB 15.3 03/11/2024 1120   HCT 46.2 03/11/2024 1120   PLT 258 03/11/2024 1120   MCV 87.3 03/11/2024 1120   MCH 28.9 03/11/2024 1120   MCHC 33.1 03/11/2024 1120   RDW 13.9 03/11/2024 1120   LYMPHSABS 2.1 06/30/2022 1145   MONOABS 0.6 06/30/2022 1145   EOSABS 344 07/29/2023 1529   BASOSABS 121 07/29/2023 1529    BMET    Component Value Date/Time   NA 138 03/11/2024 1120   NA 141 02/08/2023 0832   K 4.0 03/11/2024 1120   CL 102 03/11/2024 1120   CO2 21 (L) 03/11/2024 1120   GLUCOSE 104 (H) 03/11/2024 1120   BUN 15 03/11/2024 1120   BUN 13 02/08/2023 0832   CREATININE 1.02 03/11/2024 1120   CREATININE 1.34 (H) 07/29/2023 1529   CALCIUM  9.5 03/11/2024 1120   GFRNONAA >60 03/11/2024 1120   GFRAA >60 10/20/2018 2047    BNP    Component Value Date/Time  BNP 151.1 (H) 06/07/2020 0500    ProBNP No results found for: PROBNP  Imaging: No results found.   Assessment & Plan:   1. OSA (obstructive sleep apnea) (Primary)  Assessment and Plan Assessment & Plan Obstructive sleep apnea Severe obstructive sleep apnea diagnosed in 2016, intolerant to CPAP due to discomfort. Inspire device implanted in October 2025. Postoperative hematoma at the right neck, now resolved with  some residual scarring/numbness at site. Current usage of Matthew [name] is sporadic, with 50-40% usage greater than four hours per night. Reports improved effectiveness at level five, with more restorative sleep and reduced daytime fatigue. No significant discomfort reported, though initial impulse is noted as 'funky'. Compliance needs improvement to 70-80% for four hours per night before sleep lab evaluation. - Changed start delay to 30 minutes from 60 minutes. - Increase Inspire level to six (1.4V) on Friday. - Aim for 70-80% compliance with four hours of usage per night. - Set an alarm to remind to turn on the device before sleep. - If level six is too strong, revert to level five and retry after a few nights. - Scheduled follow-up in 4-6 weeks for readiness check    Matthew LELON Ferrari, NP 06/10/2024     [1]  Allergies Allergen Reactions   Gluten Meal Other (See Comments)    Celiac disease   Lactose Intolerance (Gi) Diarrhea    Can tolerate hard cheese  [2]  Social History Tobacco Use  Smoking Status Every Day   Current packs/day: 1.00   Average packs/day: 1 pack/day for 1 year (1.0 ttl pk-yrs)   Types: Cigarettes  Smokeless Tobacco Never  Tobacco Comments   Pt smokes 0.5 PPD. AB, CMA 06-10-24   Trying to cut back, now using nicotine  pouches per pt on 02/04/24.   "

## 2024-06-10 NOTE — Patient Instructions (Addendum)
" °  VISIT SUMMARY: During your visit, we discussed your progress with the Inspire device for your severe sleep apnea. You reported improved sleep quality and reduced fatigue, although your usage of the device has been sporadic. We made some adjustments to the device settings to help improve your compliance and overall effectiveness.  YOUR PLAN: -OBSTRUCTIVE SLEEP APNEA: Obstructive sleep apnea is a condition where your airway becomes blocked during sleep, causing breathing interruptions. You have been using the Inspire device to help manage this condition. We have changed the start delay to 30 minutes and increased the device level to six. Aim for 70-80% compliance with at least four hours of usage per night. Set an alarm to remind you to turn on the device before sleep. If level six feels too strong, revert to level five and try again after a few nights. We will follow up in 4-6 weeks to assess your progress.  INSTRUCTIONS: We will follow up in 4-6 weeks to assess your progress with the [facility] device.  "

## 2024-06-11 ENCOUNTER — Other Ambulatory Visit: Payer: Self-pay | Admitting: Cardiology

## 2024-06-17 NOTE — Telephone Encounter (Signed)
 Labs Completed on 03/16/24

## 2024-06-21 ENCOUNTER — Encounter (HOSPITAL_COMMUNITY): Payer: Self-pay | Admitting: Pharmacy Technician

## 2024-06-21 ENCOUNTER — Other Ambulatory Visit: Payer: Self-pay

## 2024-06-21 ENCOUNTER — Emergency Department (HOSPITAL_COMMUNITY)
Admission: EM | Admit: 2024-06-21 | Discharge: 2024-06-21 | Disposition: A | Discharge status: Home / Self Care | Attending: Emergency Medicine | Admitting: Emergency Medicine

## 2024-06-21 DIAGNOSIS — E119 Type 2 diabetes mellitus without complications: Secondary | ICD-10-CM | POA: Diagnosis not present

## 2024-06-21 DIAGNOSIS — Z7982 Long term (current) use of aspirin: Secondary | ICD-10-CM | POA: Diagnosis not present

## 2024-06-21 DIAGNOSIS — Z7984 Long term (current) use of oral hypoglycemic drugs: Secondary | ICD-10-CM | POA: Diagnosis not present

## 2024-06-21 DIAGNOSIS — X58XXXD Exposure to other specified factors, subsequent encounter: Secondary | ICD-10-CM | POA: Insufficient documentation

## 2024-06-21 DIAGNOSIS — I2510 Atherosclerotic heart disease of native coronary artery without angina pectoris: Secondary | ICD-10-CM | POA: Insufficient documentation

## 2024-06-21 DIAGNOSIS — S3992XD Unspecified injury of lower back, subsequent encounter: Secondary | ICD-10-CM | POA: Diagnosis present

## 2024-06-21 DIAGNOSIS — Z794 Long term (current) use of insulin: Secondary | ICD-10-CM | POA: Insufficient documentation

## 2024-06-21 DIAGNOSIS — S39012D Strain of muscle, fascia and tendon of lower back, subsequent encounter: Secondary | ICD-10-CM | POA: Insufficient documentation

## 2024-06-21 LAB — URINALYSIS, ROUTINE W REFLEX MICROSCOPIC
Bacteria, UA: NONE SEEN
Bilirubin Urine: NEGATIVE
Glucose, UA: >=500 mg/dL — AB
Hgb urine dipstick: NEGATIVE
Ketones, ur: NEGATIVE mg/dL
Leukocytes,Ua: NEGATIVE
Nitrite: NEGATIVE
Protein, ur: NEGATIVE mg/dL
Specific Gravity, Urine: 1.008 (ref 1.005–1.030)
pH: 6 (ref 5.0–8.0)

## 2024-06-21 MED ORDER — OXYCODONE HCL 5 MG PO TABS: 5.0000 mg | ORAL_TABLET | Freq: Once | ORAL | Status: DC

## 2024-06-21 MED ORDER — CYCLOBENZAPRINE HCL 10 MG PO TABS: 10.0000 mg | ORAL_TABLET | Freq: Two times a day (BID) | ORAL | 0 refills | Status: AC | PRN

## 2024-06-21 MED ORDER — METHYLPREDNISOLONE SODIUM SUCC 40 MG IJ SOLR
40.0000 mg | Freq: Once | INTRAMUSCULAR | Status: AC
  Administered 2024-06-21: 40 mg via INTRAMUSCULAR
  Filled 2024-06-21: qty 1

## 2024-06-21 MED ORDER — ACETAMINOPHEN 500 MG PO TABS
1000.0000 mg | ORAL_TABLET | Freq: Once | ORAL | Status: AC
  Administered 2024-06-21: 1000 mg via ORAL
  Filled 2024-06-21: qty 2

## 2024-06-21 MED ORDER — KETOROLAC TROMETHAMINE 15 MG/ML IJ SOLN
15.0000 mg | Freq: Once | INTRAMUSCULAR | Status: AC
  Administered 2024-06-21: 15 mg via INTRAMUSCULAR
  Filled 2024-06-21: qty 1

## 2024-06-21 MED ORDER — METHOCARBAMOL 500 MG PO TABS: 500.0000 mg | ORAL_TABLET | Freq: Once | ORAL | Status: DC

## 2024-06-21 MED ORDER — HYDROCODONE-ACETAMINOPHEN 5-325 MG PO TABS: 1.0000 | ORAL_TABLET | Freq: Four times a day (QID) | ORAL | 0 refills | Status: AC | PRN

## 2024-06-21 MED ORDER — LIDOCAINE 5 % EX PTCH
1.0000 | MEDICATED_PATCH | Freq: Once | CUTANEOUS | Status: DC
  Administered 2024-06-21: 1 via TRANSDERMAL
  Filled 2024-06-21: qty 1

## 2024-06-21 NOTE — ED Provider Notes (Signed)
 " [facility] AT [facility] Provider Note   CSN: [id number] Arrival date & time: 06/21/24  9163     Patient presents with: Back Pain   Matthew [name] is a 49 y.o. male.  Patient is a 49 year old male with history of CAD and type 2 diabetes who presents to the ED for lower back for the past 4 days.  Notes he woke up with spasms in his lower back that have continued to get worse.  Pain does radiate down the left leg.  States he has had back pain like this previously.  No previous back surgeries.  Denies fall/injury or heavy lifting.  States he did see his primary care doctor on Friday and was given tizanidine  but this has not helped.  Notes he cannot take NSAIDs secondary to Plavix  use.  He has been taking over-the-counter Tylenol  with minimal relief.  Denies numbness/tingling/weakness, dysuria, hematuria, incontinence, saddle anesthesias.    Back Pain Associated symptoms: no chest pain, no dysuria, no fever, no headaches and no numbness        Prior to Admission medications  Medication Sig Start Date End Date Taking? Authorizing Provider  cyclobenzaprine  (FLEXERIL ) 10 MG tablet Take 1 tablet (10 mg total) by mouth 2 (two) times daily as needed for muscle spasms. 06/21/24  Yes Neysa Thersia RAMAN, PA-C  HYDROcodone -acetaminophen  (NORCO/VICODIN) 5-325 MG tablet Take 1 tablet by mouth every 6 (six) hours as needed for up to 10 doses. 06/21/24  Yes Lanyah Spengler, Thersia RAMAN, PA-C  ARIPiprazole  (ABILIFY ) 20 MG tablet Take 1 tablet (20 mg total) by mouth daily. 06/02/24   Rhys Verneita DASEN, PA-C  aspirin  EC 81 MG tablet Take 1 tablet (81 mg total) by mouth daily. Swallow whole. 07/08/22   Tex Drilling, NP  atomoxetine  (STRATTERA ) 100 MG capsule Take 1 capsule (100 mg total) by mouth daily. 06/02/24   Rhys Verneita T, PA-C  atorvastatin  (LIPITOR ) 80 MG tablet TAKE 1 TABLET BY MOUTH EVERY DAY 06/17/24   Shlomo Wilbert SAUNDERS, MD  Bempedoic Acid-Ezetimibe  (NEXLIZET ) 180-10 MG TABS Take 1 tablet  by mouth daily. 05/07/24   Shlomo Wilbert SAUNDERS, MD  busPIRone  (BUSPAR ) 15 MG tablet 2 p.o. every morning and 1 p.o. nightly 06/02/24   Hurst, Verneita T, PA-C  carvedilol  (COREG ) 12.5 MG tablet TAKE 3 TABLETS (37.5 MG TOTAL) BY MOUTH 2 (TWO) TIMES DAILY. 06/17/24   Shlomo Wilbert SAUNDERS, MD  clopidogrel  (PLAVIX ) 75 MG tablet TAKE 1 TABLET BY MOUTH EVERY DAY 06/17/24   Shlomo Wilbert SAUNDERS, MD  Continuous Glucose Sensor (DEXCOM G7 SENSOR) MISC 1 Device by Does not apply route as directed. 04/28/24   Shamleffer, Ibtehal Jaralla, MD  DULoxetine  (CYMBALTA ) 60 MG capsule Take 2 capsules (120 mg total) by mouth daily. 03/02/24   Rhys Verneita T, PA-C  empagliflozin  (JARDIANCE ) 25 MG TABS tablet Take 1 tablet (25 mg total) by mouth every morning. 04/28/24   Shamleffer, Ibtehal Jaralla, MD  eszopiclone  (LUNESTA ) 2 MG TABS tablet Take 1 tablet (2 mg total) by mouth at bedtime as needed for sleep. Take immediately before bedtime 03/02/24   Rhys Verneita T, PA-C  fenofibrate  (TRICOR ) 145 MG tablet Take 1 tablet (145 mg total) by mouth daily. 11/11/23   Shlomo Wilbert SAUNDERS, MD  gabapentin  (NEURONTIN ) 800 MG tablet TAKE 1 TABLET BY MOUTH 4 TIMES A DAY WITH AN EXTRA 1/2 TABLET DURING THE DAY AS NEEDED 04/02/24   Rhys Verneita T, PA-C  hydrOXYzine  (ATARAX ) 50 MG tablet TAKE 1 TABLET  BY MOUTH EVERY 6 HOURS AS NEEDED 05/18/24   Rhys Boyer T, PA-C  icosapent  Ethyl (VASCEPA ) 1 g capsule Take 2 capsules (2 g total) by mouth 2 (two) times daily. 10/04/23   Wyn Jackee VEAR Mickey., NP  insulin  glargine (LANTUS  SOLOSTAR) 100 UNIT/ML Solostar Pen Inject 20 Units into the skin daily. 04/28/24   Shamleffer, Ibtehal Jaralla, MD  Insulin  Pen Needle 32G X 4 MM MISC 1 Device by Does not apply route daily in the afternoon. 04/28/24   Shamleffer, Ibtehal Jaralla, MD  metFORMIN  (GLUCOPHAGE -XR) 750 MG 24 hr tablet Take 2 tablets (1,500 mg total) by mouth daily with breakfast. 04/28/24   Shamleffer, Ibtehal Jaralla, MD  omeprazole (PRILOSEC) 40 MG capsule Take 40 mg  by mouth daily.    [provider]  Semaglutide , 2 MG/DOSE, (OZEMPIC , 2 MG/DOSE,) 8 MG/3ML SOPN 2 mg by Other route once a week. 04/28/24   Shamleffer, Ibtehal Jaralla, MD  tadalafil (CIALIS) 5 MG tablet Take 5 mg by mouth daily.    [provider]  Tavaborole  (KERYDIN ) 5 % SOLN Apply 1 drop topically daily. Apply 1 drop to the toenail daily. 03/09/24   Gershon Donnice SAUNDERS, DPM  UNABLE TO FIND [employer]    [provider]  valbenazine  (INGREZZA ) 80 MG capsule Take 1 capsule (80 mg total) by mouth daily. 04/12/24   Rhys Boyer DASEN, PA-C    Allergies: Gluten meal and Lactose intolerance (gi)    Review of Systems  Constitutional:  Negative for chills and fever.  Respiratory:  Negative for shortness of breath.   Cardiovascular:  Negative for chest pain.  Gastrointestinal:  Negative for nausea and vomiting.  Genitourinary:  Negative for dysuria, flank pain and hematuria.  Musculoskeletal:  Positive for back pain. Negative for gait problem.  Neurological:  Negative for numbness and headaches.  All other systems reviewed and are negative.   Updated Vital Signs BP 137/87   Pulse 81   Temp 98.1 F (36.7 C)   Resp 16   SpO2 97%   Physical Exam Constitutional:      Appearance: Normal appearance.  HENT:     Head: Normocephalic and atraumatic.     Nose: Nose normal.     Mouth/Throat:     Mouth: Mucous membranes are moist.     Pharynx: Oropharynx is clear.  Cardiovascular:     Rate and Rhythm: Normal rate.  Pulmonary:     Effort: Pulmonary effort is normal.  Abdominal:     General: Bowel sounds are normal.     Palpations: Abdomen is soft.     Tenderness: There is no abdominal tenderness. There is no right CVA tenderness or left CVA tenderness.  Musculoskeletal:     Comments: Full range of motion of bilateral lower extremities with equal strength.  Tender palpation on the left lower lumbar area around the coccyx area.  No deformities or crepitus noted.  No  central lumbar tenderness.  Ambulatory with a slight limp.  Equal dorsiflexion/plantarflexion bilaterally.  PT pulses 2+ bilaterally.  Skin:    General: Skin is warm and dry.  Neurological:     Mental Status: He is alert and oriented to person, place, and time.  Psychiatric:        Mood and Affect: Mood normal.        Behavior: Behavior normal.     (all labs ordered are listed, but only abnormal results are displayed) Labs Reviewed  URINALYSIS, ROUTINE W REFLEX MICROSCOPIC - Abnormal; Notable for the  following components:      Result Value   Color, Urine STRAW (*)    Glucose, UA >=500 (*)    All other components within normal limits    EKG: None  Radiology: No results found.    Medications Ordered in the ED  lidocaine  (LIDODERM ) 5 % 1 patch (1 patch Transdermal Patch Applied 06/21/24 1007)  ketorolac  (TORADOL ) 15 MG/ML injection 15 mg (15 mg Intramuscular Given 06/21/24 1007)  methylPREDNISolone  sodium succinate (SOLU-MEDROL ) 40 mg/mL injection 40 mg (40 mg Intramuscular Given 06/21/24 1006)  acetaminophen  (TYLENOL ) tablet 1,000 mg (1,000 mg Oral Given 06/21/24 1007)                                   Medical Decision Making Patient is a 49 year old male who presents to the ED for left lower back pain for the past 4 days.  Denies fall or injury.  Please see detailed HPI above.  On exam patient is alert and in no acute distress.  Physical exam as noted above.  He is ambulatory but with pain.  Neurovascularly intact.  Differential includes acute muscle strain, sciatica, UTI, pyonephritis, nephrolithiasis, cauda equina.  Denies incontinence or saddle anesthesias, less concerns for cauda equina.  UA obtained and negative for acute signs of infection or hematuria, less concerns for nephrolithiasis.  Suspect patient's pain is musculoskeletal in nature.  No concerns for emergent imaging as patient denies fall or injury.  He was given Tylenol , Toradol , Solu-Medrol , and lidocaine  patch while  in ED and states pain is improving.  He is ambulatory and vital signs are stable.  Stable for discharge home and outpatient treatment at this time.  Prescribed Flexeril  and a few Norco as he states he cannot have NSAIDs secondary to being on Plavix .  Symptomatic care discussed.  Advised PCP follow-up in 1 week if no improvement.  Return precautions provided.  Amount and/or Complexity of Data Reviewed Labs: ordered.  Risk OTC drugs. Prescription drug management.       Final diagnoses:  Strain of lumbar region, subsequent encounter    ED Discharge Orders          Ordered    cyclobenzaprine  (FLEXERIL ) 10 MG tablet  2 times daily PRN        06/21/24 1117    HYDROcodone -acetaminophen  (NORCO/VICODIN) 5-325 MG tablet  Every 6 hours PRN        06/21/24 1117               Neysa Thersia RAMAN, PA-C 06/21/24 1141    Patsey Lot, MD 06/21/24 1455  "

## 2024-06-21 NOTE — ED Triage Notes (Signed)
 Pt here with back spasms since Thursday. Taking tizanidine  without relief.

## 2024-06-21 NOTE — Discharge Instructions (Signed)
 May take Norco every 6 hours as needed for severe pain.  Otherwise take Tylenol .  May take Flexeril  2-3 times a day as needed for pain/muscle spasms.  Do not take tizanidine  at the same time.  Apply heat pads to the back to help with pain.  May also wear over-the-counter lidocaine  patch on the area for up to 12 hours at a time.  Get some rest for the next couple days but continue to move as normally as tolerated to avoid getting stiff.  Follow-up with primary care doctor in 1 week if no improvement.  Return to ED if any symptoms worsen including severe uncontrollable pain, inability to walk, incontinence, numbness/tingling in the groin.

## 2024-06-24 ENCOUNTER — Other Ambulatory Visit: Payer: Self-pay | Admitting: Physician Assistant

## 2024-06-24 NOTE — Telephone Encounter (Signed)
 Spoke with pharmacy, said to ignore this request

## 2024-06-30 ENCOUNTER — Ambulatory Visit: Admitting: Dermatology

## 2024-07-09 ENCOUNTER — Encounter: Admitting: Primary Care

## 2024-07-16 ENCOUNTER — Ambulatory Visit (INDEPENDENT_AMBULATORY_CARE_PROVIDER_SITE_OTHER): Admitting: Physician Assistant

## 2024-10-27 ENCOUNTER — Ambulatory Visit: Admitting: Internal Medicine
